# Patient Record
Sex: Female | Born: 1935 | Race: White | Hispanic: No | Marital: Married | Smoking: Former smoker
Health system: Southern US, Community
[De-identification: ages and names within clinical notes are randomized; demographics above are authoritative.]

## PROBLEM LIST (undated history)

## (undated) DIAGNOSIS — Z87442 Personal history of urinary calculi: Secondary | ICD-10-CM

## (undated) DIAGNOSIS — M810 Age-related osteoporosis without current pathological fracture: Secondary | ICD-10-CM

## (undated) DIAGNOSIS — R221 Localized swelling, mass and lump, neck: Secondary | ICD-10-CM

## (undated) DIAGNOSIS — I480 Paroxysmal atrial fibrillation: Secondary | ICD-10-CM

## (undated) DIAGNOSIS — I5032 Chronic diastolic (congestive) heart failure: Secondary | ICD-10-CM

## (undated) DIAGNOSIS — J189 Pneumonia, unspecified organism: Secondary | ICD-10-CM

## (undated) DIAGNOSIS — I369 Nonrheumatic tricuspid valve disorder, unspecified: Secondary | ICD-10-CM

## (undated) DIAGNOSIS — L659 Nonscarring hair loss, unspecified: Secondary | ICD-10-CM

## (undated) DIAGNOSIS — H9190 Unspecified hearing loss, unspecified ear: Secondary | ICD-10-CM

## (undated) DIAGNOSIS — H3530 Unspecified macular degeneration: Secondary | ICD-10-CM

## (undated) DIAGNOSIS — D45 Polycythemia vera: Secondary | ICD-10-CM

## (undated) DIAGNOSIS — M109 Gout, unspecified: Secondary | ICD-10-CM

## (undated) DIAGNOSIS — I059 Rheumatic mitral valve disease, unspecified: Secondary | ICD-10-CM

## (undated) DIAGNOSIS — E039 Hypothyroidism, unspecified: Secondary | ICD-10-CM

## (undated) DIAGNOSIS — S52509A Unspecified fracture of the lower end of unspecified radius, initial encounter for closed fracture: Secondary | ICD-10-CM

## (undated) DIAGNOSIS — M1990 Unspecified osteoarthritis, unspecified site: Secondary | ICD-10-CM

## (undated) DIAGNOSIS — G4700 Insomnia, unspecified: Secondary | ICD-10-CM

## (undated) DIAGNOSIS — S52609A Unspecified fracture of lower end of unspecified ulna, initial encounter for closed fracture: Secondary | ICD-10-CM

## (undated) DIAGNOSIS — R9439 Abnormal result of other cardiovascular function study: Secondary | ICD-10-CM

## (undated) DIAGNOSIS — I10 Essential (primary) hypertension: Secondary | ICD-10-CM

## (undated) DIAGNOSIS — E7800 Pure hypercholesterolemia, unspecified: Secondary | ICD-10-CM

## (undated) HISTORY — PX: TONSILLECTOMY: SUR1361

## (undated) HISTORY — DX: Paroxysmal atrial fibrillation: I48.0

## (undated) HISTORY — DX: Insomnia, unspecified: G47.00

## (undated) HISTORY — DX: Unspecified hearing loss, unspecified ear: H91.90

## (undated) HISTORY — DX: Abnormal result of other cardiovascular function study: R94.39

## (undated) HISTORY — DX: Rheumatic mitral valve disease, unspecified: I05.9

## (undated) HISTORY — DX: Gout, unspecified: M10.9

## (undated) HISTORY — DX: Hypothyroidism, unspecified: E03.9

## (undated) HISTORY — DX: Unspecified fracture of the lower end of unspecified radius, initial encounter for closed fracture: S52.509A

## (undated) HISTORY — DX: Unspecified macular degeneration: H35.30

## (undated) HISTORY — PX: TYMPANOPLASTY: SHX33

## (undated) HISTORY — DX: Age-related osteoporosis without current pathological fracture: M81.0

## (undated) HISTORY — DX: Pneumonia, unspecified organism: J18.9

## (undated) HISTORY — DX: Polycythemia vera: D45

## (undated) HISTORY — DX: Chronic diastolic (congestive) heart failure: I50.32

## (undated) HISTORY — DX: Pure hypercholesterolemia, unspecified: E78.00

## (undated) HISTORY — PX: TOTAL ABDOMINAL HYSTERECTOMY: SHX209

## (undated) HISTORY — DX: Nonscarring hair loss, unspecified: L65.9

## (undated) HISTORY — DX: Essential (primary) hypertension: I10

## (undated) HISTORY — DX: Localized swelling, mass and lump, neck: R22.1

## (undated) HISTORY — DX: Unspecified fracture of lower end of unspecified ulna, initial encounter for closed fracture: S52.609A

## (undated) HISTORY — DX: Nonrheumatic tricuspid valve disorder, unspecified: I36.9

---

## 1997-06-17 ENCOUNTER — Other Ambulatory Visit: Admission: RE | Admit: 1997-06-17 | Discharge: 1997-06-17 | Payer: Self-pay | Admitting: Obstetrics and Gynecology

## 1998-06-18 ENCOUNTER — Ambulatory Visit (HOSPITAL_COMMUNITY): Admission: RE | Admit: 1998-06-18 | Discharge: 1998-06-18 | Payer: Self-pay | Admitting: Obstetrics and Gynecology

## 1998-06-18 HISTORY — PX: BREAST BIOPSY: SHX20

## 1998-06-20 ENCOUNTER — Other Ambulatory Visit: Admission: RE | Admit: 1998-06-20 | Discharge: 1998-06-20 | Payer: Self-pay | Admitting: Obstetrics and Gynecology

## 2000-06-29 ENCOUNTER — Other Ambulatory Visit: Admission: RE | Admit: 2000-06-29 | Discharge: 2000-06-29 | Payer: Self-pay | Admitting: Obstetrics and Gynecology

## 2000-07-07 ENCOUNTER — Ambulatory Visit (HOSPITAL_COMMUNITY): Admission: RE | Admit: 2000-07-07 | Discharge: 2000-07-07 | Payer: Self-pay | Admitting: Obstetrics and Gynecology

## 2000-07-07 ENCOUNTER — Encounter: Payer: Self-pay | Admitting: Obstetrics and Gynecology

## 2001-07-18 ENCOUNTER — Ambulatory Visit (HOSPITAL_COMMUNITY): Admission: RE | Admit: 2001-07-18 | Discharge: 2001-07-18 | Payer: Self-pay | Admitting: Obstetrics and Gynecology

## 2001-07-18 ENCOUNTER — Encounter: Payer: Self-pay | Admitting: Obstetrics and Gynecology

## 2005-06-09 ENCOUNTER — Encounter: Payer: Self-pay | Admitting: Obstetrics and Gynecology

## 2006-08-22 ENCOUNTER — Ambulatory Visit: Payer: Self-pay | Admitting: Orthopedic Surgery

## 2006-08-23 ENCOUNTER — Ambulatory Visit (HOSPITAL_COMMUNITY): Admission: RE | Admit: 2006-08-23 | Discharge: 2006-08-23 | Payer: Self-pay | Admitting: Orthopedic Surgery

## 2008-07-02 LAB — HM DEXA SCAN: HM Dexa Scan: -2.6

## 2008-09-30 ENCOUNTER — Ambulatory Visit (HOSPITAL_COMMUNITY): Admission: RE | Admit: 2008-09-30 | Discharge: 2008-09-30 | Payer: Self-pay | Admitting: Pulmonary Disease

## 2011-03-16 DIAGNOSIS — H35369 Drusen (degenerative) of macula, unspecified eye: Secondary | ICD-10-CM | POA: Diagnosis not present

## 2011-03-16 DIAGNOSIS — H353190 Nonexudative age-related macular degeneration, unspecified eye, stage unspecified: Secondary | ICD-10-CM | POA: Diagnosis not present

## 2011-05-11 DIAGNOSIS — E039 Hypothyroidism, unspecified: Secondary | ICD-10-CM | POA: Diagnosis not present

## 2011-05-11 DIAGNOSIS — I10 Essential (primary) hypertension: Secondary | ICD-10-CM | POA: Diagnosis not present

## 2011-05-26 DIAGNOSIS — Z803 Family history of malignant neoplasm of breast: Secondary | ICD-10-CM | POA: Diagnosis not present

## 2011-05-26 DIAGNOSIS — M810 Age-related osteoporosis without current pathological fracture: Secondary | ICD-10-CM | POA: Diagnosis not present

## 2011-05-26 DIAGNOSIS — Z01419 Encounter for gynecological examination (general) (routine) without abnormal findings: Secondary | ICD-10-CM | POA: Diagnosis not present

## 2011-05-26 DIAGNOSIS — Z8041 Family history of malignant neoplasm of ovary: Secondary | ICD-10-CM | POA: Diagnosis not present

## 2011-05-27 DIAGNOSIS — Z1231 Encounter for screening mammogram for malignant neoplasm of breast: Secondary | ICD-10-CM | POA: Diagnosis not present

## 2011-07-18 DIAGNOSIS — I10 Essential (primary) hypertension: Secondary | ICD-10-CM | POA: Diagnosis not present

## 2011-07-18 DIAGNOSIS — S52509A Unspecified fracture of the lower end of unspecified radius, initial encounter for closed fracture: Secondary | ICD-10-CM | POA: Diagnosis not present

## 2011-07-18 DIAGNOSIS — W010XXA Fall on same level from slipping, tripping and stumbling without subsequent striking against object, initial encounter: Secondary | ICD-10-CM | POA: Diagnosis not present

## 2011-07-18 DIAGNOSIS — S52209A Unspecified fracture of shaft of unspecified ulna, initial encounter for closed fracture: Secondary | ICD-10-CM | POA: Diagnosis not present

## 2011-07-18 DIAGNOSIS — R55 Syncope and collapse: Secondary | ICD-10-CM | POA: Diagnosis not present

## 2011-07-18 DIAGNOSIS — IMO0002 Reserved for concepts with insufficient information to code with codable children: Secondary | ICD-10-CM | POA: Diagnosis not present

## 2011-07-18 DIAGNOSIS — S01501A Unspecified open wound of lip, initial encounter: Secondary | ICD-10-CM | POA: Diagnosis not present

## 2011-07-18 DIAGNOSIS — I6789 Other cerebrovascular disease: Secondary | ICD-10-CM | POA: Diagnosis not present

## 2011-07-18 DIAGNOSIS — S025XXA Fracture of tooth (traumatic), initial encounter for closed fracture: Secondary | ICD-10-CM | POA: Diagnosis not present

## 2011-07-18 DIAGNOSIS — M1990 Unspecified osteoarthritis, unspecified site: Secondary | ICD-10-CM | POA: Diagnosis not present

## 2011-07-18 DIAGNOSIS — Z23 Encounter for immunization: Secondary | ICD-10-CM | POA: Diagnosis not present

## 2011-07-18 DIAGNOSIS — E039 Hypothyroidism, unspecified: Secondary | ICD-10-CM | POA: Diagnosis not present

## 2011-07-18 DIAGNOSIS — M25539 Pain in unspecified wrist: Secondary | ICD-10-CM | POA: Diagnosis not present

## 2011-07-18 DIAGNOSIS — S52309A Unspecified fracture of shaft of unspecified radius, initial encounter for closed fracture: Secondary | ICD-10-CM | POA: Diagnosis not present

## 2011-07-21 DIAGNOSIS — N390 Urinary tract infection, site not specified: Secondary | ICD-10-CM | POA: Diagnosis not present

## 2011-07-21 DIAGNOSIS — S52209A Unspecified fracture of shaft of unspecified ulna, initial encounter for closed fracture: Secondary | ICD-10-CM | POA: Diagnosis not present

## 2011-08-02 DIAGNOSIS — S52209A Unspecified fracture of shaft of unspecified ulna, initial encounter for closed fracture: Secondary | ICD-10-CM | POA: Diagnosis not present

## 2011-08-04 DIAGNOSIS — I10 Essential (primary) hypertension: Secondary | ICD-10-CM | POA: Diagnosis not present

## 2011-08-04 DIAGNOSIS — E039 Hypothyroidism, unspecified: Secondary | ICD-10-CM | POA: Diagnosis not present

## 2011-08-04 DIAGNOSIS — N390 Urinary tract infection, site not specified: Secondary | ICD-10-CM | POA: Diagnosis not present

## 2011-08-04 DIAGNOSIS — R609 Edema, unspecified: Secondary | ICD-10-CM | POA: Diagnosis not present

## 2011-08-05 DIAGNOSIS — E039 Hypothyroidism, unspecified: Secondary | ICD-10-CM | POA: Diagnosis not present

## 2011-08-05 DIAGNOSIS — I10 Essential (primary) hypertension: Secondary | ICD-10-CM | POA: Diagnosis not present

## 2011-08-05 DIAGNOSIS — N390 Urinary tract infection, site not specified: Secondary | ICD-10-CM | POA: Diagnosis not present

## 2011-08-05 DIAGNOSIS — R609 Edema, unspecified: Secondary | ICD-10-CM | POA: Diagnosis not present

## 2011-08-06 DIAGNOSIS — D582 Other hemoglobinopathies: Secondary | ICD-10-CM | POA: Diagnosis not present

## 2011-08-06 DIAGNOSIS — E875 Hyperkalemia: Secondary | ICD-10-CM | POA: Diagnosis not present

## 2011-08-06 DIAGNOSIS — E871 Hypo-osmolality and hyponatremia: Secondary | ICD-10-CM | POA: Diagnosis not present

## 2011-08-06 DIAGNOSIS — E039 Hypothyroidism, unspecified: Secondary | ICD-10-CM | POA: Diagnosis not present

## 2011-08-06 LAB — BASIC METABOLIC PANEL
BUN: 19 mg/dL (ref 4–21)
CREATININE: 0.7 mg/dL (ref <=1.1)
GLUCOSE: 100 mg/dL
SODIUM: 131 mmol/L — AB (ref 137–147)

## 2011-08-09 DIAGNOSIS — E875 Hyperkalemia: Secondary | ICD-10-CM | POA: Diagnosis not present

## 2011-08-09 DIAGNOSIS — D582 Other hemoglobinopathies: Secondary | ICD-10-CM | POA: Diagnosis not present

## 2011-08-09 DIAGNOSIS — R609 Edema, unspecified: Secondary | ICD-10-CM | POA: Diagnosis not present

## 2011-08-09 LAB — CBC AND DIFFERENTIAL
Neutrophils Absolute: 10 /uL
WBC: 12.7 10*3/mL

## 2011-08-11 DIAGNOSIS — R718 Other abnormality of red blood cells: Secondary | ICD-10-CM | POA: Diagnosis not present

## 2011-08-11 DIAGNOSIS — D45 Polycythemia vera: Secondary | ICD-10-CM | POA: Diagnosis not present

## 2011-08-18 DIAGNOSIS — N390 Urinary tract infection, site not specified: Secondary | ICD-10-CM | POA: Diagnosis not present

## 2011-08-18 DIAGNOSIS — S52209A Unspecified fracture of shaft of unspecified ulna, initial encounter for closed fracture: Secondary | ICD-10-CM | POA: Diagnosis not present

## 2011-08-18 DIAGNOSIS — D45 Polycythemia vera: Secondary | ICD-10-CM | POA: Diagnosis not present

## 2011-08-18 DIAGNOSIS — S52309A Unspecified fracture of shaft of unspecified radius, initial encounter for closed fracture: Secondary | ICD-10-CM | POA: Diagnosis not present

## 2011-08-25 DIAGNOSIS — D45 Polycythemia vera: Secondary | ICD-10-CM | POA: Diagnosis not present

## 2011-08-30 DIAGNOSIS — N390 Urinary tract infection, site not specified: Secondary | ICD-10-CM | POA: Diagnosis not present

## 2011-09-02 DIAGNOSIS — D45 Polycythemia vera: Secondary | ICD-10-CM | POA: Diagnosis not present

## 2011-09-06 DIAGNOSIS — D45 Polycythemia vera: Secondary | ICD-10-CM | POA: Diagnosis not present

## 2011-09-09 DIAGNOSIS — E213 Hyperparathyroidism, unspecified: Secondary | ICD-10-CM | POA: Diagnosis not present

## 2011-09-09 DIAGNOSIS — E210 Primary hyperparathyroidism: Secondary | ICD-10-CM | POA: Diagnosis not present

## 2011-09-13 DIAGNOSIS — D582 Other hemoglobinopathies: Secondary | ICD-10-CM | POA: Diagnosis not present

## 2011-09-15 DIAGNOSIS — Z961 Presence of intraocular lens: Secondary | ICD-10-CM | POA: Diagnosis not present

## 2011-09-15 DIAGNOSIS — H26499 Other secondary cataract, unspecified eye: Secondary | ICD-10-CM | POA: Diagnosis not present

## 2011-09-15 DIAGNOSIS — H3530 Unspecified macular degeneration: Secondary | ICD-10-CM | POA: Diagnosis not present

## 2011-09-16 DIAGNOSIS — D582 Other hemoglobinopathies: Secondary | ICD-10-CM | POA: Diagnosis not present

## 2011-09-20 DIAGNOSIS — R5381 Other malaise: Secondary | ICD-10-CM | POA: Diagnosis not present

## 2011-09-20 DIAGNOSIS — R5383 Other fatigue: Secondary | ICD-10-CM | POA: Diagnosis not present

## 2011-09-22 DIAGNOSIS — S52209A Unspecified fracture of shaft of unspecified ulna, initial encounter for closed fracture: Secondary | ICD-10-CM | POA: Diagnosis not present

## 2011-09-22 DIAGNOSIS — S52309A Unspecified fracture of shaft of unspecified radius, initial encounter for closed fracture: Secondary | ICD-10-CM | POA: Diagnosis not present

## 2011-09-22 DIAGNOSIS — D45 Polycythemia vera: Secondary | ICD-10-CM | POA: Diagnosis not present

## 2011-09-30 DIAGNOSIS — D45 Polycythemia vera: Secondary | ICD-10-CM | POA: Diagnosis not present

## 2011-10-06 DIAGNOSIS — D45 Polycythemia vera: Secondary | ICD-10-CM | POA: Diagnosis not present

## 2011-10-13 DIAGNOSIS — D45 Polycythemia vera: Secondary | ICD-10-CM | POA: Diagnosis not present

## 2011-10-20 DIAGNOSIS — D45 Polycythemia vera: Secondary | ICD-10-CM | POA: Diagnosis not present

## 2011-10-27 DIAGNOSIS — D45 Polycythemia vera: Secondary | ICD-10-CM | POA: Diagnosis not present

## 2011-11-03 DIAGNOSIS — D45 Polycythemia vera: Secondary | ICD-10-CM | POA: Diagnosis not present

## 2011-11-09 DIAGNOSIS — R5381 Other malaise: Secondary | ICD-10-CM | POA: Diagnosis not present

## 2011-11-09 DIAGNOSIS — R0602 Shortness of breath: Secondary | ICD-10-CM | POA: Diagnosis not present

## 2011-11-09 DIAGNOSIS — R609 Edema, unspecified: Secondary | ICD-10-CM | POA: Diagnosis not present

## 2011-11-09 DIAGNOSIS — Z87898 Personal history of other specified conditions: Secondary | ICD-10-CM | POA: Diagnosis not present

## 2011-11-10 DIAGNOSIS — D45 Polycythemia vera: Secondary | ICD-10-CM | POA: Diagnosis not present

## 2011-11-11 DIAGNOSIS — H35369 Drusen (degenerative) of macula, unspecified eye: Secondary | ICD-10-CM | POA: Diagnosis not present

## 2011-11-11 DIAGNOSIS — H353190 Nonexudative age-related macular degeneration, unspecified eye, stage unspecified: Secondary | ICD-10-CM | POA: Diagnosis not present

## 2011-11-11 DIAGNOSIS — H11009 Unspecified pterygium of unspecified eye: Secondary | ICD-10-CM | POA: Diagnosis not present

## 2011-11-12 DIAGNOSIS — D45 Polycythemia vera: Secondary | ICD-10-CM | POA: Diagnosis not present

## 2011-11-16 DIAGNOSIS — I517 Cardiomegaly: Secondary | ICD-10-CM | POA: Diagnosis not present

## 2011-11-16 DIAGNOSIS — R0602 Shortness of breath: Secondary | ICD-10-CM | POA: Diagnosis not present

## 2011-11-16 DIAGNOSIS — I079 Rheumatic tricuspid valve disease, unspecified: Secondary | ICD-10-CM | POA: Diagnosis not present

## 2011-11-16 DIAGNOSIS — I4891 Unspecified atrial fibrillation: Secondary | ICD-10-CM | POA: Diagnosis not present

## 2011-11-16 DIAGNOSIS — R609 Edema, unspecified: Secondary | ICD-10-CM | POA: Diagnosis not present

## 2011-11-24 DIAGNOSIS — Z23 Encounter for immunization: Secondary | ICD-10-CM | POA: Diagnosis not present

## 2011-11-25 DIAGNOSIS — D1739 Benign lipomatous neoplasm of skin and subcutaneous tissue of other sites: Secondary | ICD-10-CM | POA: Diagnosis not present

## 2011-11-29 DIAGNOSIS — R609 Edema, unspecified: Secondary | ICD-10-CM | POA: Diagnosis not present

## 2011-11-29 DIAGNOSIS — M79609 Pain in unspecified limb: Secondary | ICD-10-CM | POA: Diagnosis not present

## 2011-12-01 DIAGNOSIS — I369 Nonrheumatic tricuspid valve disorder, unspecified: Secondary | ICD-10-CM | POA: Diagnosis not present

## 2011-12-01 DIAGNOSIS — R609 Edema, unspecified: Secondary | ICD-10-CM | POA: Diagnosis not present

## 2011-12-01 DIAGNOSIS — I4891 Unspecified atrial fibrillation: Secondary | ICD-10-CM | POA: Diagnosis not present

## 2011-12-01 DIAGNOSIS — R0602 Shortness of breath: Secondary | ICD-10-CM | POA: Diagnosis not present

## 2011-12-01 DIAGNOSIS — I10 Essential (primary) hypertension: Secondary | ICD-10-CM | POA: Diagnosis not present

## 2011-12-01 DIAGNOSIS — I059 Rheumatic mitral valve disease, unspecified: Secondary | ICD-10-CM | POA: Diagnosis not present

## 2011-12-02 DIAGNOSIS — E039 Hypothyroidism, unspecified: Secondary | ICD-10-CM | POA: Diagnosis not present

## 2011-12-02 DIAGNOSIS — M79609 Pain in unspecified limb: Secondary | ICD-10-CM | POA: Diagnosis not present

## 2011-12-02 DIAGNOSIS — I10 Essential (primary) hypertension: Secondary | ICD-10-CM | POA: Diagnosis not present

## 2011-12-02 DIAGNOSIS — I369 Nonrheumatic tricuspid valve disorder, unspecified: Secondary | ICD-10-CM | POA: Diagnosis not present

## 2011-12-02 DIAGNOSIS — R609 Edema, unspecified: Secondary | ICD-10-CM | POA: Diagnosis not present

## 2011-12-06 DIAGNOSIS — R609 Edema, unspecified: Secondary | ICD-10-CM | POA: Diagnosis not present

## 2011-12-06 DIAGNOSIS — M79609 Pain in unspecified limb: Secondary | ICD-10-CM | POA: Diagnosis not present

## 2011-12-08 DIAGNOSIS — R609 Edema, unspecified: Secondary | ICD-10-CM | POA: Diagnosis not present

## 2011-12-08 DIAGNOSIS — R0602 Shortness of breath: Secondary | ICD-10-CM | POA: Diagnosis not present

## 2011-12-08 DIAGNOSIS — I10 Essential (primary) hypertension: Secondary | ICD-10-CM | POA: Diagnosis not present

## 2011-12-08 DIAGNOSIS — I4891 Unspecified atrial fibrillation: Secondary | ICD-10-CM | POA: Diagnosis not present

## 2011-12-09 DIAGNOSIS — D45 Polycythemia vera: Secondary | ICD-10-CM | POA: Diagnosis not present

## 2011-12-10 DIAGNOSIS — I4891 Unspecified atrial fibrillation: Secondary | ICD-10-CM | POA: Diagnosis not present

## 2011-12-10 DIAGNOSIS — R609 Edema, unspecified: Secondary | ICD-10-CM | POA: Diagnosis not present

## 2011-12-10 DIAGNOSIS — Z87898 Personal history of other specified conditions: Secondary | ICD-10-CM | POA: Diagnosis not present

## 2011-12-10 DIAGNOSIS — R0602 Shortness of breath: Secondary | ICD-10-CM | POA: Diagnosis not present

## 2011-12-16 DIAGNOSIS — I4891 Unspecified atrial fibrillation: Secondary | ICD-10-CM | POA: Diagnosis not present

## 2011-12-21 DIAGNOSIS — I4891 Unspecified atrial fibrillation: Secondary | ICD-10-CM | POA: Diagnosis not present

## 2011-12-21 DIAGNOSIS — R221 Localized swelling, mass and lump, neck: Secondary | ICD-10-CM | POA: Diagnosis not present

## 2011-12-21 DIAGNOSIS — R220 Localized swelling, mass and lump, head: Secondary | ICD-10-CM | POA: Diagnosis not present

## 2011-12-21 DIAGNOSIS — R609 Edema, unspecified: Secondary | ICD-10-CM | POA: Diagnosis not present

## 2011-12-22 DIAGNOSIS — R0602 Shortness of breath: Secondary | ICD-10-CM | POA: Diagnosis not present

## 2011-12-22 DIAGNOSIS — I10 Essential (primary) hypertension: Secondary | ICD-10-CM | POA: Diagnosis not present

## 2011-12-22 DIAGNOSIS — I4891 Unspecified atrial fibrillation: Secondary | ICD-10-CM | POA: Diagnosis not present

## 2011-12-22 DIAGNOSIS — R9430 Abnormal result of cardiovascular function study, unspecified: Secondary | ICD-10-CM | POA: Diagnosis not present

## 2011-12-24 ENCOUNTER — Encounter: Payer: Self-pay | Admitting: *Deleted

## 2011-12-27 ENCOUNTER — Encounter: Payer: Self-pay | Admitting: Cardiology

## 2011-12-27 ENCOUNTER — Ambulatory Visit (INDEPENDENT_AMBULATORY_CARE_PROVIDER_SITE_OTHER): Payer: Medicare Other | Admitting: Cardiology

## 2011-12-27 VITALS — BP 118/72 | HR 71 | Ht 62.0 in | Wt 124.0 lb

## 2011-12-27 DIAGNOSIS — I509 Heart failure, unspecified: Secondary | ICD-10-CM

## 2011-12-27 DIAGNOSIS — I2510 Atherosclerotic heart disease of native coronary artery without angina pectoris: Secondary | ICD-10-CM | POA: Insufficient documentation

## 2011-12-27 DIAGNOSIS — N179 Acute kidney failure, unspecified: Secondary | ICD-10-CM | POA: Diagnosis not present

## 2011-12-27 DIAGNOSIS — I5032 Chronic diastolic (congestive) heart failure: Secondary | ICD-10-CM | POA: Insufficient documentation

## 2011-12-27 DIAGNOSIS — I10 Essential (primary) hypertension: Secondary | ICD-10-CM

## 2011-12-27 DIAGNOSIS — I4891 Unspecified atrial fibrillation: Secondary | ICD-10-CM | POA: Diagnosis not present

## 2011-12-27 DIAGNOSIS — R0602 Shortness of breath: Secondary | ICD-10-CM | POA: Diagnosis not present

## 2011-12-27 DIAGNOSIS — I480 Paroxysmal atrial fibrillation: Secondary | ICD-10-CM | POA: Insufficient documentation

## 2011-12-27 LAB — CBC WITH DIFFERENTIAL/PLATELET
Basophils Absolute: 0 10*3/uL (ref 0.0–0.1)
Basophils Relative: 0.3 % (ref 0.0–3.0)
Eosinophils Absolute: 0 10*3/uL (ref 0.0–0.7)
Eosinophils Relative: 0.4 % (ref 0.0–5.0)
HCT: 33.2 % — ABNORMAL LOW (ref 36.0–46.0)
Hemoglobin: 11.2 g/dL — ABNORMAL LOW (ref 12.0–15.0)
Lymphocytes Relative: 37.1 % (ref 12.0–46.0)
Lymphs Abs: 1.8 10*3/uL (ref 0.7–4.0)
MCHC: 33.8 g/dL (ref 30.0–36.0)
MCV: 119.8 fl — ABNORMAL HIGH (ref 78.0–100.0)
Monocytes Absolute: 0.5 10*3/uL (ref 0.1–1.0)
Monocytes Relative: 9.7 % (ref 3.0–12.0)
Neutro Abs: 2.5 10*3/uL (ref 1.4–7.7)
Neutrophils Relative %: 52.5 % (ref 43.0–77.0)
Platelets: 190 10*3/uL (ref 150.0–400.0)
RBC: 2.78 Mil/uL — ABNORMAL LOW (ref 3.87–5.11)
RDW: 16 % — ABNORMAL HIGH (ref 11.5–14.6)
WBC: 4.8 10*3/uL (ref 4.5–10.5)

## 2011-12-27 LAB — BASIC METABOLIC PANEL WITH GFR
BUN: 70 mg/dL — ABNORMAL HIGH (ref 6–23)
CO2: 26 meq/L (ref 19–32)
Calcium: 10.7 mg/dL — ABNORMAL HIGH (ref 8.4–10.5)
Chloride: 104 meq/L (ref 96–112)
Creatinine, Ser: 1.5 mg/dL — ABNORMAL HIGH (ref 0.4–1.2)
GFR: 35.84 mL/min — ABNORMAL LOW
Glucose, Bld: 102 mg/dL — ABNORMAL HIGH (ref 70–99)
Potassium: 5.4 meq/L — ABNORMAL HIGH (ref 3.5–5.1)
Sodium: 138 meq/L (ref 135–145)

## 2011-12-27 LAB — BRAIN NATRIURETIC PEPTIDE: Pro B Natriuretic peptide (BNP): 573 pg/mL — ABNORMAL HIGH (ref 0.0–100.0)

## 2011-12-27 MED ORDER — AMIODARONE HCL 200 MG PO TABS: ORAL_TABLET | ORAL | Status: DC

## 2011-12-27 NOTE — Patient Instructions (Addendum)
Start amiodarone 400mg  two times a day for 1 week, then 400mg  daily for 1 week, then 200mg  daily. Stay on 200mg  daily. This will be 2 (total 400mg ) 200mg  tablets two times a day for 1 week, then 2 (total 400mg ) 200mg  tablets daily for 1 week, then 1 of your 200mg  tablets. Stay on 200mg  daily.   Your physician recommends that you have lab work today-CBCd/BMET/BNP.   Your physician recommends that you have a FASTING lipid profile. Do not eat or drink after midnight . I have given you an order to have this done at your primary care doctor. Please fax the results to [phone] Dr Shirlee Latch.   Your physician recommends that you schedule a follow-up appointment in: 2 weeks after the cardioversion with Dr Shirlee Latch. I am trying to get the cardioversion scheduled for 01/19/12.

## 2011-12-27 NOTE — Progress Notes (Signed)
Patient ID: Sarah Mays, female   DOB: Jul 11, 1930, 78 y.o.   MRN: [id number] PCP: Dr. Gwenevere Abbot in [place]  78 yo with persistent atrial fibrillation and chronic diastolic CHF presents for cardiology evaluation.  Patient has had some exertional dyspnea for over a year.  The dyspnea, however, became significantly worse in late September/early October.  She notes dyspnea walking up stairs or even gentle hills.  She is short of breath walking long distances.  She has had mild ankle edema for over a year.  Again, the edema became considerably worse in late September/early October.  She had an echo done showed preserved EF with mild pulmonary HTN.  On the echo, she was noted to be in atrial fibrillation.  This was confirmed with subsequent evaluations.  She has had no stroke-like symptoms.  No chest pain.  She was initially started on warfarin but stopped it due to nosebleeds.  She is currently on Eliquis at reduced dose.  She was started on Lasix because of the lower extremity swelling.  She is now taking it at 40 mg bid and the swelling has dissipated.    Labs were done today and were significant for hyperkalemia (K = 5.4) and abnormal renal function (BUN 70, creatinine 1.5).    ECG: atrial fibrillation at 78 with nonspecific T wave flattening.   Labs (11/13): TSH normal, K 5.4, creatinine 1.5, BUN 70, HCT 33.2, BNP 573  PMH: 1. Atrial fibrillation: Diagnosed initially in 10/13.  Holter monitor in 11/13 showed atrial fibrillation with average rate 65.  2. Chronic diastolic CHF: Echo (10/13) with EF 65-70%, mild MR, moderate biatrial enlargement, moderate TR, PA systolic pressure 45 mmHg.   3. ETT-Sestamibi (11/13): Exercised to stage II, small partially reversible anteroapical perfusion defect suggesting mild ischemia versus attenuation, EF 80%.  4. HTN 5. Polycythemia vera: Has had phlebotomy only once.  6. Hypothyroidism 7. Macular degeneration 8. TAH 1980  SH: Prior smoker (years  ago).  Married, lives in [place].  Occasional ETOH.  Daughter works for [employer].   FH: CAD  ROS: All systems reviewed and negative except as per HPI.   Current Outpatient Prescriptions  Medication Sig Dispense Refill  . amLODipine-olmesartan (AZOR) 10-40 MG per tablet Take 1 tablet by mouth daily.      [name] Kitchen apixaban (ELIQUIS) 2.5 MG TABS tablet Take 2.5 mg by mouth 2 (two) times daily.      . Cholecalciferol (VITAMIN D3) 400 UNITS CAPS Take 1 capsule by mouth daily.      . furosemide (LASIX) 20 MG tablet Take 40 mg by mouth 2 (two) times daily.       [name] Kitchen glucosamine-chondroitin 500-400 MG tablet Take 1 tablet by mouth daily.      . hydroxyurea (HYDREA) 500 MG capsule Take by mouth. TAKE 1 CAPSULE ON Monday & Tuesday ALL OTHER DAYS TAKE 2 CAPSULES.   May take with food to minimize GI side effects.      [name] Kitchen levothyroxine (SYNTHROID, LEVOTHROID) 75 MCG tablet Take 75 mcg by mouth daily.      . Lutein 10 MG TABS Take 1 tablet by mouth daily.      . magnesium oxide (MAG-OX) 400 MG tablet Take 400 mg by mouth daily.      . nebivolol (BYSTOLIC) 5 MG tablet Take 5 mg by mouth daily.      . potassium chloride (K-DUR) 10 MEQ tablet Take 10 mEq by mouth 2 (two) times daily.      [name] Kitchen  amiodarone (PACERONE) 200 MG tablet 2 (total 400mg  )two times a day for 1 week, then 2 (total 400mg ) daily for 1 week, then 1 daily. Stay on 1 daily.  60 tablet  3    BP 118/72  Pulse 71  Ht 5\' 2"  (1.575 m)  Wt 124 lb (56.246 kg)  BMI 22.68 kg/m2 General: NAD Neck: No JVD, no thyromegaly or thyroid nodule.  Lungs: Clear to auscultation bilaterally with normal respiratory effort. CV: Nondisplaced PMI.  Heart irregular S1/S2, no S3/S4, 1/6 HSM LLSB.  No peripheral edema.  No carotid bruit.  Normal pedal pulses.  Abdomen: Soft, nontender, no hepatosplenomegaly, no distention.  Skin: Intact without lesions or rashes.  Neurologic: Alert and oriented x 3.  Psych: Normal affect. Extremities: No clubbing or cyanosis.  HEENT:  Normal.   Assessment/Plan:  1. Renal dysfunction: Suspect AKI in the setting of aggressive recent diuresis.  I do not, however, know her baseline creatinine.   - Stop Lasix x 3 days, then decrease to 40 mg once daily.  - Stop KCl (hyperkalemia).  - Stop olmesartan.   - Repeat BMET in 1 week.  2. Atrial fibrillation: First noted in 10/13.  Her increased exertional dyspnea seems to date from around that time.  I suspect that the onset of atrial fibrillation probably triggered a diastolic CHF exacerbation. CHADSVASC score is 14 (age, female gender, HTN, CHF).  She is anticoagulated with Eliquis 2.5 mg bid.  I would agree with the reduced dose of Eliquis given her age, small size (< 60 kg), and renal dysfunction.  As I think that atrial fibrillation is a major driver of her symptoms, I told her that I thought a trial of DCCV would be reasonable.  Given her moderately enlarged atria, I think that she would need an antiarrhythmic for the best chance to maintain NSR after cardioversion.  As she lives considerably distant from [place], I think that amiodarone would probably be the best choice here.  - Continue Eliquis 2.5 mg bid.  After a month of anticoagulation (20 more days), will plan to bring her in for DCCV.  - Start amiodarone 400 mg bid x 1 wk, then 400 mg daily x 1 wk, then 200 mg daily.  She will need periodic monitoring of TSH and LFTs.  She will need yearly eye exams and a baseline set of PFTs.  I talked to her about the possible side effects from amiodarone.  However, will try to maintain her on only a low dose.   3. CAD: Patient has not had any chest pain.  She has exertional dyspnea that seems to date from around the likely onset of her atrial fibrillation.  She had an ETT-Sestamibi recently that showed a small partially reversible anteroapical perfusion defect.  This may be a small area of ischemia but soft tissue attenuation is certainly also possible.  This does not sound like a high risk  study.  I will plan to deal with her atrial fibrillation first (cardioversion) and will consider cath if she continues to have significant exertional dyspnea when in NSR.  Her abnormal renal function currently (creatinine 1.5 with BUN 70) also makes cath risky.  4. HTN: As above, will be stopping olmesartan.  Will continue amlodipine and will increase nebivolol to 10 mg daily.  5. Chronic diastolic CHF: Patient did not appear volume overloaded on exam.  Her pro-BNP was elevated at 573 but this is significantly affected by age and renal function. As above, she seems to  be over-diuresed.  I will have her hold Lasix x 3 days then decrease dose to 40 mg once daily.  6. I will have her get fasting lipids.  7. Mrs Hinger has a rather complicated set of issues we are dealing with here.  She wants her cardiac care in [place] (daughter lives here) though she lives distantly.  Will try to coordinate care with her PCP.   Marca Ancona 12/27/2011

## 2011-12-28 ENCOUNTER — Other Ambulatory Visit: Payer: Self-pay | Admitting: *Deleted

## 2011-12-28 DIAGNOSIS — I4891 Unspecified atrial fibrillation: Secondary | ICD-10-CM

## 2011-12-28 DIAGNOSIS — I10 Essential (primary) hypertension: Secondary | ICD-10-CM

## 2011-12-28 MED ORDER — AMLODIPINE BESYLATE 10 MG PO TABS: 10.0000 mg | ORAL_TABLET | Freq: Every day | ORAL | Status: DC

## 2011-12-29 ENCOUNTER — Encounter: Payer: Self-pay | Admitting: *Deleted

## 2011-12-30 DIAGNOSIS — I5032 Chronic diastolic (congestive) heart failure: Secondary | ICD-10-CM | POA: Diagnosis not present

## 2011-12-30 DIAGNOSIS — I4891 Unspecified atrial fibrillation: Secondary | ICD-10-CM | POA: Diagnosis not present

## 2011-12-31 ENCOUNTER — Telehealth: Payer: Self-pay | Admitting: Cardiology

## 2011-12-31 NOTE — Telephone Encounter (Signed)
Pt was updated with lasix instructions, she will take 40 mg daily starting today, pt agreed to plan.

## 2011-12-31 NOTE — Telephone Encounter (Signed)
New problem:   clarification of dosage  & direction of medication .  Furosemide

## 2012-01-03 ENCOUNTER — Ambulatory Visit (INDEPENDENT_AMBULATORY_CARE_PROVIDER_SITE_OTHER): Payer: Medicare Other | Admitting: Cardiology

## 2012-01-03 ENCOUNTER — Encounter: Payer: Self-pay | Admitting: Cardiology

## 2012-01-03 ENCOUNTER — Other Ambulatory Visit (INDEPENDENT_AMBULATORY_CARE_PROVIDER_SITE_OTHER): Payer: Medicare Other

## 2012-01-03 VITALS — BP 124/60 | HR 63 | Ht 62.0 in | Wt 129.8 lb

## 2012-01-03 DIAGNOSIS — I2510 Atherosclerotic heart disease of native coronary artery without angina pectoris: Secondary | ICD-10-CM | POA: Diagnosis not present

## 2012-01-03 DIAGNOSIS — I10 Essential (primary) hypertension: Secondary | ICD-10-CM

## 2012-01-03 DIAGNOSIS — R0602 Shortness of breath: Secondary | ICD-10-CM

## 2012-01-03 DIAGNOSIS — I4891 Unspecified atrial fibrillation: Secondary | ICD-10-CM

## 2012-01-03 DIAGNOSIS — I5032 Chronic diastolic (congestive) heart failure: Secondary | ICD-10-CM

## 2012-01-03 DIAGNOSIS — I509 Heart failure, unspecified: Secondary | ICD-10-CM

## 2012-01-03 DIAGNOSIS — N179 Acute kidney failure, unspecified: Secondary | ICD-10-CM

## 2012-01-03 LAB — HEPATIC FUNCTION PANEL: Total Bilirubin: 1.2 mg/dL (ref 0.3–1.2)

## 2012-01-03 LAB — BASIC METABOLIC PANEL
Chloride: 102 mEq/L (ref 96–112)
Creatinine, Ser: 1.2 mg/dL (ref 0.4–1.2)
Potassium: 3.8 mEq/L (ref 3.5–5.1)

## 2012-01-03 LAB — BRAIN NATRIURETIC PEPTIDE: Pro B Natriuretic peptide (BNP): 866 pg/mL — ABNORMAL HIGH (ref 0.0–100.0)

## 2012-01-03 LAB — TSH: TSH: 6.45 u[IU]/mL — ABNORMAL HIGH (ref 0.35–5.50)

## 2012-01-03 MED ORDER — AMIODARONE HCL 200 MG PO TABS: ORAL_TABLET | ORAL | Status: DC

## 2012-01-03 MED ORDER — NEBIVOLOL HCL 5 MG PO TABS: 5.0000 mg | ORAL_TABLET | Freq: Every day | ORAL | Status: DC

## 2012-01-03 NOTE — Progress Notes (Signed)
Patient ID: Sarah Mays, female   DOB: 1930/02/27, 78 y.o.   MRN: [id number] PCP: Dr. Gwenevere Abbot in [place]  78 y.o. with persistent atrial fibrillation and chronic diastolic CHF returns for cardiology evaluation.  Patient has had some exertional dyspnea for over a year.  The dyspnea, however, became significantly worse in late September/early October.  She notes dyspnea walking up stairs or even gentle hills.  She is short of breath walking long distances.  She has had mild ankle edema for over a year.  Again, the edema became considerably worse in late September/early October.  She had an echo done showed preserved EF with mild pulmonary HTN.  On the echo, she was noted to be in atrial fibrillation.  This was confirmed with subsequent evaluations.  She has had no stroke-like symptoms.  No chest pain.  She was initially started on warfarin but stopped it due to nosebleeds.  She is currently on Eliquis at reduced dose.  She was started on Lasix because of the lower extremity swelling.  She was taking it at 40 mg bid.    At her initial appointment here, she was hyperkalemic with elevated BUN and creatinine (significantly above her baseline).  I had her hold Lasix for 3 days then cut it back to once daily.  I stopped olmesartan and supplemental K.  I also started her on amiodarone in preparation for DCCV.  I thought that given her moderately enlarged atria, her chances of maintaining NSR would not be good without an antiarrhythmic.  Since then, her ankle edema has worsened.  Her weight is up about 5 lbs.  Does report less exertional dyspnea.  ECG today shows NSR, but her HR was slow in the 40s at rest.     ECG: NSR at 44 with nonspecific inferior T wave inversions.   Labs (7/13): K 4.7, creatinine 0.56 Labs (8/13): K 5.4, creatinine 0.69 Labs (11/13): TSH normal, K 5.4, creatinine 1.5, BUN 70, HCT 33.2, BNP 573  PMH: 1. Atrial fibrillation: Diagnosed initially in 10/13.  Holter monitor in 11/13  showed atrial fibrillation with average rate 65.  Back in NSR on amiodarone on 01/03/12.  2. Chronic diastolic CHF: Echo (10/13) with EF 65-70%, mild MR, moderate biatrial enlargement, moderate TR, PA systolic pressure 45 mmHg.   3. ETT-Sestamibi (11/13): Exercised to stage II, small partially reversible anteroapical perfusion defect suggesting mild ischemia versus attenuation, EF 80%.  4. HTN 5. Polycythemia vera: Has had phlebotomy only once.  6. Hypothyroidism 7. Macular degeneration 8. TAH 1980 9. Familial hypocalciuric hypercalcemia  SH: Prior smoker (years ago).  Married, lives in [place].  Occasional ETOH.  Daughter works for [employer].   FH: CAD  ROS: All systems reviewed and negative except as per HPI.   Current Outpatient Prescriptions  Medication Sig Dispense Refill  . amiodarone (PACERONE) 200 MG tablet 2 TABLETS ONCE DAILY X ONE WEEK THEN ONE TABLET ONCE DAILY  60 tablet  3  . amLODipine (NORVASC) 10 MG tablet Take 1 tablet (10 mg total) by mouth daily.  30 tablet  6  . apixaban (ELIQUIS) 2.5 MG TABS tablet Take 2.5 mg by mouth 2 (two) times daily.      . Cholecalciferol (VITAMIN D3) 400 UNITS CAPS Take 1 capsule by mouth daily.      . furosemide (LASIX) 40 MG tablet Take 1 tablet (40 mg total) by mouth daily.      [name] Kitchen glucosamine-chondroitin 500-400 MG tablet Take 1 tablet by mouth daily.      [name] Kitchen  hydroxyurea (HYDREA) 500 MG capsule Take by mouth. TAKE 1 CAPSULE ON Monday & Tuesday ALL OTHER DAYS TAKE 2 CAPSULES.   May take with food to minimize GI side effects.      [name] Kitchen levothyroxine (SYNTHROID, LEVOTHROID) 75 MCG tablet Take 75 mcg by mouth daily.      . Lutein 10 MG TABS Take 1 tablet by mouth daily.      . magnesium oxide (MAG-OX) 400 MG tablet Take 400 mg by mouth daily.      . nebivolol (BYSTOLIC) 5 MG tablet Take 1 tablet (5 mg total) by mouth daily. 2 (total 10mg ) 5mg  tablets  30 tablet    . [DISCONTINUED] amiodarone (PACERONE) 200 MG tablet 2 (total 400mg  )two times a  day for 1 week, then 2 (total 400mg ) daily for 1 week, then 1 daily. Stay on 1 daily.  60 tablet  3  . [DISCONTINUED] nebivolol (BYSTOLIC) 5 MG tablet 2 (total 10mg ) 5mg  tablets        BP 124/60  Pulse 63  Ht 5\' 2"  (1.575 m)  Wt 129 lb 12.8 oz (58.877 kg)  BMI 23.74 kg/m2 General: NAD Neck: No JVD, no thyromegaly or thyroid nodule.  Lungs: Slight crackles at bases bilaterally.  CV: Nondisplaced PMI.  Heart irregular S1/S2, no S3/S4, 1/6 HSM LLSB.  2+ ankle edema.  No carotid bruit.  Normal pedal pulses.  Abdomen: Soft, nontender, no hepatosplenomegaly, no distention.  Neurologic: Alert and oriented x 3.  Psych: Normal affect. Extremities: No clubbing or cyanosis.   Assessment/Plan:  1. Renal dysfunction: Suspect AKI in the setting of aggressive recent diuresis.  She was considerably up from baseline BUN/creatinine when we checked her BMET about a week ago.  - Repeat BMET today.   2. Atrial fibrillation: First noted in 10/13.  Her increased exertional dyspnea seems to date from around that time.  I suspect that the onset of atrial fibrillation probably triggered a diastolic CHF exacerbation. CHADSVASC score is 76 (age, female gender, HTN, CHF).  She is anticoagulated with Eliquis 2.5 mg bid.  I would agree with the reduced dose of Eliquis given her age, small size (< 76 kg), and renal dysfunction.  Given her moderately enlarged atria, I thought that she would need an antiarrhythmic for the best chance to maintain NSR after cardioversion.  As she lives considerably distant from [place], I thought that amiodarone would probably be the best choice.  She is back in NSR today but HR is in the 40s at rest.   - Decrease amiodarone to 400 mg daily x 1 week then decrease to 200 mg daily.  She will need periodic monitoring of TSH and LFTs (will check today).  She will need yearly eye exams and a baseline set of PFTs.  I talked to her about the possible side effects from amiodarone.  However, will try  to maintain her on only a low dose.   - Given bradycardia, will decrease nebivolol to 5 mg daily.  3. CAD: Patient has not had any chest pain.  She has exertional dyspnea that seems to date from around the likely onset of her atrial fibrillation.  She had an ETT-Sestamibi recently that showed a small partially reversible anteroapical perfusion defect.  This may be a small area of ischemia but soft tissue attenuation is certainly also possible.  This does not sound like a high risk study.  Her abnormal renal function (creatinine 1.5 with BUN 70) makes cath risky.  I will see her  back in 2 wks.  If her exertional dyspnea is significantly improved in NSR, would probably continue to manage medically.  If she continues to be symptomatic and her renal fxn returns to a reasonable level, I will plan to cath her.  She had lipids drawn recently at [facility].  Will call for result.  4. HTN: BP stable.  As above, will be decreasing nebivolol.   5. Chronic diastolic CHF: Patient does not have significant JVD but she has developed significant ankle swelling since cutting back on Lasix.  Diastolic CHF likely plays a role, but use of amlodipine and venous insufficiency are probably also issues. - I will have her wear knee-high graded compression stockings.  - Check BMET/BNP today: if creatinine better, will consider adding back 20 mg Lasix in the evening.   Marca Ancona 2:06 PM 01/03/2012

## 2012-01-03 NOTE — Patient Instructions (Addendum)
Your physician recommends that you schedule a follow-up appointment in: 2 WEEKS WITH DR [facility]  DECREASE BYSTOLIC TO 5 MG ONCE DAILY  DECREASE AMIODARONE TO 200 MG TAKE TWO TABLETS ONCE DAILY X ONE WEEK THEN ONE TABLET ONCE DAILY  Your physician recommends that you HAVE LAB WORK TODAY  [facility]

## 2012-01-07 DIAGNOSIS — D751 Secondary polycythemia: Secondary | ICD-10-CM | POA: Diagnosis not present

## 2012-01-07 DIAGNOSIS — D582 Other hemoglobinopathies: Secondary | ICD-10-CM | POA: Diagnosis not present

## 2012-01-09 DIAGNOSIS — Z87898 Personal history of other specified conditions: Secondary | ICD-10-CM | POA: Diagnosis not present

## 2012-01-11 ENCOUNTER — Telehealth: Payer: Self-pay | Admitting: Cardiology

## 2012-01-11 DIAGNOSIS — I4891 Unspecified atrial fibrillation: Secondary | ICD-10-CM

## 2012-01-11 DIAGNOSIS — I10 Essential (primary) hypertension: Secondary | ICD-10-CM

## 2012-01-11 MED ORDER — NEBIVOLOL HCL 5 MG PO TABS: 5.0000 mg | ORAL_TABLET | Freq: Every day | ORAL | Status: DC

## 2012-01-11 NOTE — Telephone Encounter (Signed)
New Problem:    Patient called in needing a refill of her nebivolol (BYSTOLIC) 5 MG tablet called into her pharmacy at the new twice daily dose.  Please call back if you have any questions.

## 2012-01-11 NOTE — Telephone Encounter (Signed)
Spoke with pt. Nebivolol was decreased to 5mg  daily 01/03/12 due to bradycardia.

## 2012-01-14 ENCOUNTER — Telehealth: Payer: Self-pay | Admitting: *Deleted

## 2012-01-14 NOTE — Telephone Encounter (Signed)
Endo unit called Sue Lush) and changed arrival time for cardioversion for 12/11 to 11 am instead of 12 noon. Message sent to Dr.McLean and [name] L and changed on the card. Patient is aware of this.

## 2012-01-17 ENCOUNTER — Encounter: Payer: Self-pay | Admitting: *Deleted

## 2012-01-17 ENCOUNTER — Encounter: Payer: Self-pay | Admitting: Cardiology

## 2012-01-17 ENCOUNTER — Ambulatory Visit (INDEPENDENT_AMBULATORY_CARE_PROVIDER_SITE_OTHER): Payer: Medicare Other | Admitting: Cardiology

## 2012-01-17 VITALS — BP 118/70 | HR 75 | Ht 62.0 in | Wt 130.0 lb

## 2012-01-17 DIAGNOSIS — I2510 Atherosclerotic heart disease of native coronary artery without angina pectoris: Secondary | ICD-10-CM | POA: Diagnosis not present

## 2012-01-17 DIAGNOSIS — R0602 Shortness of breath: Secondary | ICD-10-CM

## 2012-01-17 DIAGNOSIS — N179 Acute kidney failure, unspecified: Secondary | ICD-10-CM

## 2012-01-17 DIAGNOSIS — I509 Heart failure, unspecified: Secondary | ICD-10-CM

## 2012-01-17 DIAGNOSIS — I10 Essential (primary) hypertension: Secondary | ICD-10-CM

## 2012-01-17 DIAGNOSIS — I4891 Unspecified atrial fibrillation: Secondary | ICD-10-CM | POA: Diagnosis not present

## 2012-01-17 DIAGNOSIS — I5032 Chronic diastolic (congestive) heart failure: Secondary | ICD-10-CM

## 2012-01-17 LAB — BASIC METABOLIC PANEL
Calcium: 10.5 mg/dL (ref 8.4–10.5)
Chloride: 99 mEq/L (ref 96–112)
Creatinine, Ser: 1.2 mg/dL (ref 0.4–1.2)

## 2012-01-17 MED ORDER — FUROSEMIDE 40 MG PO TABS: 40.0000 mg | ORAL_TABLET | Freq: Two times a day (BID) | ORAL | Status: DC

## 2012-01-17 MED ORDER — APIXABAN 2.5 MG PO TABS: 2.5000 mg | ORAL_TABLET | Freq: Two times a day (BID) | ORAL | Status: DC

## 2012-01-17 NOTE — Progress Notes (Signed)
Patient ID: Sarah Mays, female   DOB: Jan 24, 1931, 78 y.o.   MRN: [id number] PCP: Dr. Gwenevere Abbot in [place]  78 yo with persistent atrial fibrillation and chronic diastolic CHF returns for cardiology evaluation.  Patient has had some exertional dyspnea for over a year.  The dyspnea, however, became significantly worse in late September/early October.  She notes dyspnea walking up stairs or even gentle hills.  She is short of breath walking long distances.  She has had mild ankle edema for over a year.  Again, the edema became considerably worse in late September/early October.  She had an echo done showed preserved EF with mild pulmonary HTN.  On the echo, she was noted to be in atrial fibrillation.  This was confirmed with subsequent evaluations.  She has had no stroke-like symptoms.  No chest pain.  She was initially started on warfarin but stopped it due to nosebleeds.  She is currently on Eliquis at reduced dose.  She was started on Lasix because of the lower extremity swelling.  She was taking it at 40 mg bid.    At her initial appointment here, she was hyperkalemic with elevated BUN and creatinine (significantly above her baseline).  I had her hold Lasix for 3 days then cut it back to once daily.  I stopped olmesartan and supplemental K.  I also started her on amiodarone in preparation for DCCV.  I thought that given her moderately enlarged atria, her chances of maintaining NSR would not be good without an antiarrhythmic.  Since then, her ankles have swollen more, and I increased her Lasix back to 40 qam, 20 qpm.  Creatinine improved.  At last appointment she was in NSR but is back in atrial fibrillation today with controlled rate.  She denies dyspnea when walking short distances or going up a flight of steps.  She is short of breath walking longer distances such as around large stores.  No chest pain.  She has not worn compression stockings.    ECG: atrial fibrillation, rate 58  Labs  (7/13): K 4.7, creatinine 0.56 Labs (8/13): K 5.4, creatinine 0.69 Labs (11/13): TSH 6.45, K 5.4=>3.8, creatinine 1.5=>1.2, BUN 70=>35, HCT 33.2, BNP 866, AST 25, ALT 36  PMH: 1. Atrial fibrillation: Diagnosed initially in 10/13.  Holter monitor in 11/13 showed atrial fibrillation with average rate 65.  Back in NSR on amiodarone on 01/03/12.  In atrial fibrillation again on 12/9.  2. Chronic diastolic CHF: Echo (10/13) with EF 65-70%, mild MR, moderate biatrial enlargement, moderate TR, PA systolic pressure 45 mmHg.   3. ETT-Sestamibi (11/13): Exercised to stage II, small partially reversible anteroapical perfusion defect suggesting mild ischemia versus attenuation, EF 80%.  4. HTN 5. Polycythemia vera: Has had phlebotomy only once.  6. Hypothyroidism 7. Macular degeneration 8. TAH 1980 9. Familial hypocalciuric hypercalcemia  SH: Prior smoker (years ago).  Married, lives in [place].  Occasional ETOH.  Daughter works for [employer].   FH: CAD  ROS: All systems reviewed and negative except as per HPI.   Current Outpatient Prescriptions  Medication Sig Dispense Refill  . amiodarone (PACERONE) 200 MG tablet Take 200 mg by mouth daily.      [name] Kitchen amLODipine (NORVASC) 10 MG tablet Take 1 tablet (10 mg total) by mouth daily.  30 tablet  6  . apixaban (ELIQUIS) 2.5 MG TABS tablet Take 2.5 mg by mouth 2 (two) times daily.      . Cholecalciferol (VITAMIN D3) 400 UNITS CAPS Take 1  capsule by mouth daily.      . furosemide (LASIX) 40 MG tablet Take 1 tablet (40 mg total) by mouth daily.      [name] Kitchen glucosamine-chondroitin 500-400 MG tablet Take 1 tablet by mouth daily.      . hydroxyurea (HYDREA) 500 MG capsule Take by mouth. TAKE 1 CAPSULE ON Monday & Tuesday ALL OTHER DAYS TAKE 2 CAPSULES.   May take with food to minimize GI side effects.      [name] Kitchen levothyroxine (SYNTHROID, LEVOTHROID) 75 MCG tablet Take 75 mcg by mouth daily.      . Lutein 10 MG TABS Take 1 tablet by mouth daily.      . magnesium oxide  (MAG-OX) 400 MG tablet Take 400 mg by mouth daily.      . nebivolol (BYSTOLIC) 5 MG tablet Take 1 tablet (5 mg total) by mouth daily.  30 tablet  6  . [DISCONTINUED] amiodarone (PACERONE) 200 MG tablet 2 TABLETS ONCE DAILY X ONE WEEK THEN ONE TABLET ONCE DAILY  60 tablet  3    BP 118/70  Pulse 75  Ht 5\' 2"  (1.575 m)  Wt 130 lb (58.968 kg)  BMI 23.78 kg/m2  SpO2 93% General: NAD Neck: No JVD, no thyromegaly or thyroid nodule.  Lungs: Slight crackles at bases bilaterally.  CV: Nondisplaced PMI.  Heart irregular S1/S2, no S3/S4, 1/6 HSM LLSB.  2+ edema 1/2 up lower legs bilaterally.  No carotid bruit.  Abdomen: Soft, nontender, no hepatosplenomegaly, no distention.  Neurologic: Alert and oriented x 3.  Psych: Normal affect. Extremities: No clubbing or cyanosis.   Assessment/Plan:  1. Renal dysfunction: Suspect AKI initially in the setting of ARB + diuresis.  This improved with discontinuation of olmesartan and cutting back Lasix.  2. Atrial fibrillation: First noted in 10/13.  Her increased exertional dyspnea seems to date from around that time.  I suspect that the onset of atrial fibrillation probably triggered a diastolic CHF exacerbation. CHADSVASC score is 56 (age, female gender, HTN, CHF).  She is anticoagulated with Eliquis 2.5 mg bid.  I would agree with the reduced dose of Eliquis given her age, small size (< 60 kg), and renal dysfunction.  Given her moderately enlarged atria, I thought that she would need an antiarrhythmic for the best chance to maintain NSR after cardioversion.  As she lives considerably distant from [place], I thought that amiodarone would probably be the best choice.  At last appointment, she was in atrial fibrillation but is in atrial fibrillation today.  - Continue amiodarone 200 mg daily.  She will need periodic monitoring of TSH (TSH was recently low, report sent to PCP as she is on thyroid replacement) and LFTs.  She will need yearly eye exams and a baseline  set of PFTs, which I will arrange.   - She is set up for a cardioversion on Wednesday, which I will go ahead and do as she is in atrial fibrillation and has been anticoagulated for an appropriate amount of time.  If she does not maintain NSR, I will stop amiodarone.  If she feels considerably better in NSR and then goes back into atrial fibrillation, we can consider atrial fibrillation ablation attempt.   3. CAD: Patient has not had any chest pain.  She has exertional dyspnea that seems to date from around the likely onset of her atrial fibrillation.  She had an ETT-Sestamibi recently that showed a small partially reversible anteroapical perfusion defect.  This may be a small area  of ischemia but soft tissue attenuation is certainly also possible.  This does not sound like a high risk study.  Her abnormal renal function makes cath risky though more recently her creatinine has improved.  If her exertional dyspnea is significantly improved in NSR, would probably continue to manage medically.  If she continues to be symptomatic and her renal fxn remains at a reasonable level, I will plan to cath her.  She had lipids drawn recently at [facility].  Will call for result.  4. HTN: BP stable on current regimen.  5. Chronic diastolic CHF: Patient does not have significant JVD but she has developed significant ankle swelling.  Diastolic CHF likely plays a role (especially given elevated BNP), but use of amlodipine and venous insufficiency are probably also issues. - I will have her wear thigh-high graded compression stockings.  - Increase Lasix to 40 mg bid with BMET/BNP in 2 wks.   - If no improvement, can consider cutting back on amlodipine.   Marca Ancona 2:21 PM 01/17/2012

## 2012-01-17 NOTE — Patient Instructions (Addendum)
Increase lasix furosemide) to 40mg  two times a day.  Use compression stockings to help the swelling in your feet and legs. Put them on in the morning and take them off at night. I have given you a prescription for these.   Your physician recommends that you have lab work today--BMET.  Your physician has recommended that you have a Cardioversion (DCCV). Electrical Cardioversion uses a jolt of electricity to your heart either through paddles or wired patches attached to your chest. This is a controlled, usually prescheduled, procedure. Defibrillation is done under light anesthesia in the hospital, and you usually go home the day of the procedure. This is done to get your heart back into a normal rhythm. You are not awake for the procedure. Please see the instruction sheet given to you today. Wednesday December 11,2013 MAKE SURE YOU TAKE ELIQUIS THE MORNING OF THE CARDIOVERSION    Your physician recommends that you return for lab work 02/08/12--BMET.  Your physician has recommended that you have a pulmonary function test. Pulmonary Function Tests are a group of tests that measure how well air moves in and out of your lungs. Schedule this for 01/11/12 the same day you see Dr Shirlee Latch.  Your physician recommends that you schedule a follow-up appointment on December 31,2013 with Dr Shirlee Latch at [facility].

## 2012-01-18 ENCOUNTER — Telehealth: Payer: Self-pay | Admitting: Cardiology

## 2012-01-18 ENCOUNTER — Other Ambulatory Visit: Payer: Self-pay | Admitting: *Deleted

## 2012-01-18 MED ORDER — POTASSIUM CHLORIDE ER 10 MEQ PO TBCR: 10.0000 meq | EXTENDED_RELEASE_TABLET | Freq: Every day | ORAL | Status: DC

## 2012-01-18 NOTE — Telephone Encounter (Signed)
Pt as question re rx she just picked up

## 2012-01-18 NOTE — Telephone Encounter (Signed)
LMTCB

## 2012-01-18 NOTE — Telephone Encounter (Signed)
Spoke with pt about KCL prescription.

## 2012-01-19 ENCOUNTER — Encounter (HOSPITAL_COMMUNITY): Payer: Self-pay

## 2012-01-19 ENCOUNTER — Other Ambulatory Visit: Payer: Self-pay | Admitting: *Deleted

## 2012-01-19 ENCOUNTER — Encounter (HOSPITAL_COMMUNITY)
Admission: RE | Disposition: A | Discharge status: Home / Self Care | Payer: Self-pay | Source: Ambulatory Visit | Attending: Cardiology

## 2012-01-19 ENCOUNTER — Encounter (HOSPITAL_COMMUNITY): Payer: Self-pay | Admitting: Anesthesiology

## 2012-01-19 ENCOUNTER — Ambulatory Visit (HOSPITAL_COMMUNITY): Admit: 2012-01-19 | Payer: Self-pay | Admitting: Cardiology

## 2012-01-19 ENCOUNTER — Ambulatory Visit (HOSPITAL_COMMUNITY)
Admission: RE | Admit: 2012-01-19 | Discharge: 2012-01-19 | Disposition: A | Discharge status: Home / Self Care | Payer: Medicare Other | Source: Ambulatory Visit | Attending: Cardiology | Admitting: Cardiology

## 2012-01-19 DIAGNOSIS — I4891 Unspecified atrial fibrillation: Secondary | ICD-10-CM | POA: Insufficient documentation

## 2012-01-19 DIAGNOSIS — Z5309 Procedure and treatment not carried out because of other contraindication: Secondary | ICD-10-CM | POA: Diagnosis not present

## 2012-01-19 SURGERY — CARDIOVERSION
Anesthesia: Monitor Anesthesia Care

## 2012-01-19 MED ORDER — SODIUM CHLORIDE 0.9 % IJ SOLN: 3.0000 mL | INTRAMUSCULAR | Status: DC | PRN

## 2012-01-19 MED ORDER — SODIUM CHLORIDE 0.9 % IV SOLN: 250.0000 mL | INTRAVENOUS | Status: DC

## 2012-01-19 MED ORDER — SODIUM CHLORIDE 0.9 % IJ SOLN: 3.0000 mL | Freq: Two times a day (BID) | INTRAMUSCULAR | Status: DC

## 2012-01-19 MED ORDER — SODIUM CHLORIDE 0.9 % IV SOLN: INTRAVENOUS | Status: DC

## 2012-01-19 NOTE — H&P (Signed)
   Patient arrived at short stay today and was in NSR with PACs.  No cardioversion therefore.  Continue Eliquis and amiodarone.  I am going to have her see Dr. Johney Frame for consideration for atrial fibrillation ablation.    Marca Ancona 01/19/2012 1:42 PM

## 2012-01-21 ENCOUNTER — Telehealth: Payer: Self-pay | Admitting: Cardiology

## 2012-01-21 NOTE — Telephone Encounter (Signed)
New Problem:    Patient called in because her insurance company refused to cover her apixaban (ELIQUIS) 2.5 MG TABS tablet and would like a cheaper alternative to be prescribed for her.  They told the patient that they fax a request to our office yesterday but haven't had a response back.  Please call back.

## 2012-01-21 NOTE — Telephone Encounter (Signed)
Pt wants a less expensive alternative to eliquis but does not want to switch back to the coumadin.  She is requesting a call from [name], Dr Alford Highland nurse next week.  She thinks maybe [name] is already working on something.

## 2012-01-24 NOTE — Telephone Encounter (Signed)
Spoke with pt

## 2012-01-24 NOTE — Telephone Encounter (Signed)
Spoke with Nehemiah Settle and [employer] [place] [phone]. I am not able to complete prior authorization for Eliquis over the telephone. A form will be faxed to Dr Shirlee Latch to complete. The form should be received by the end of the day today. I spoke with pt and she is aware I am working on this. She has enough Eliquis samples to last until Friday 01/28/12.

## 2012-01-25 ENCOUNTER — Telehealth: Payer: Self-pay | Admitting: Cardiology

## 2012-01-25 NOTE — Telephone Encounter (Signed)
[facility] calling on status of prior auth for eliquis

## 2012-01-25 NOTE — Telephone Encounter (Signed)
Will forward to nurse to check on status, no eliquis samples available.

## 2012-01-26 NOTE — Telephone Encounter (Signed)
New Problem:    Called in needing additional info for the patient's apixaban (ELIQUIS) 2.5 MG TABS tablet.  Please call back

## 2012-01-26 NOTE — Telephone Encounter (Signed)
I spoke with Annlawrance at [facility] and 30 day supply of Eliquis 2.5mg  bid  did go through. Pt is aware 30 day supply is approved and I am still waiting for final authorization.

## 2012-01-26 NOTE — Telephone Encounter (Signed)
Spoke with Dee,Brittany, and Bethany at different times today at  [phone]. I have received authorization for a 30 days of Eliquis until urgent request for authorization is completed, which I am told should be within 24 hours.

## 2012-01-26 NOTE — Telephone Encounter (Signed)
See phone note dated 01/21/12

## 2012-01-27 DIAGNOSIS — I4891 Unspecified atrial fibrillation: Secondary | ICD-10-CM | POA: Diagnosis not present

## 2012-01-27 DIAGNOSIS — R5381 Other malaise: Secondary | ICD-10-CM | POA: Diagnosis not present

## 2012-01-27 DIAGNOSIS — R609 Edema, unspecified: Secondary | ICD-10-CM | POA: Diagnosis not present

## 2012-01-27 DIAGNOSIS — R5383 Other fatigue: Secondary | ICD-10-CM | POA: Diagnosis not present

## 2012-01-27 DIAGNOSIS — G4700 Insomnia, unspecified: Secondary | ICD-10-CM | POA: Diagnosis not present

## 2012-01-28 ENCOUNTER — Telehealth: Payer: Self-pay | Admitting: *Deleted

## 2012-01-28 NOTE — Telephone Encounter (Signed)
Annlawrance at [facility] aware Eliquis approved.

## 2012-01-28 NOTE — Telephone Encounter (Signed)
Annlawrance at [facility] aware Eliquis approved until 01/25/13.

## 2012-01-28 NOTE — Telephone Encounter (Signed)
Eliquis approved for 1 year --see phone note 01/28/12. Pt aware.

## 2012-01-28 NOTE — Telephone Encounter (Signed)
01/28/12--Eliquis approved through 01/25/13 by [facility] [phone].  Pt ID number [id number] certificate number [id number]. Pt aware.

## 2012-02-05 DIAGNOSIS — D45 Polycythemia vera: Secondary | ICD-10-CM | POA: Diagnosis not present

## 2012-02-08 ENCOUNTER — Other Ambulatory Visit (INDEPENDENT_AMBULATORY_CARE_PROVIDER_SITE_OTHER): Payer: Medicare Other

## 2012-02-08 ENCOUNTER — Ambulatory Visit (INDEPENDENT_AMBULATORY_CARE_PROVIDER_SITE_OTHER): Payer: Medicare Other | Admitting: Internal Medicine

## 2012-02-08 ENCOUNTER — Ambulatory Visit (INDEPENDENT_AMBULATORY_CARE_PROVIDER_SITE_OTHER): Payer: Medicare Other | Admitting: Cardiology

## 2012-02-08 ENCOUNTER — Encounter: Payer: Self-pay | Admitting: Cardiology

## 2012-02-08 VITALS — BP 115/70 | HR 55 | Wt 127.0 lb

## 2012-02-08 DIAGNOSIS — I4891 Unspecified atrial fibrillation: Secondary | ICD-10-CM | POA: Diagnosis not present

## 2012-02-08 DIAGNOSIS — I5032 Chronic diastolic (congestive) heart failure: Secondary | ICD-10-CM | POA: Diagnosis not present

## 2012-02-08 DIAGNOSIS — I2510 Atherosclerotic heart disease of native coronary artery without angina pectoris: Secondary | ICD-10-CM | POA: Diagnosis not present

## 2012-02-08 DIAGNOSIS — R0989 Other specified symptoms and signs involving the circulatory and respiratory systems: Secondary | ICD-10-CM

## 2012-02-08 DIAGNOSIS — I10 Essential (primary) hypertension: Secondary | ICD-10-CM

## 2012-02-08 DIAGNOSIS — I509 Heart failure, unspecified: Secondary | ICD-10-CM

## 2012-02-08 DIAGNOSIS — R0602 Shortness of breath: Secondary | ICD-10-CM | POA: Diagnosis not present

## 2012-02-08 LAB — CBC WITH DIFFERENTIAL/PLATELET
Eosinophils Relative: 0 % (ref 0–5)
HCT: 36.5 % (ref 36.0–46.0)
Lymphocytes Relative: 35 % (ref 12–46)
Lymphs Abs: 1.6 10*3/uL (ref 0.7–4.0)
MCV: 116.2 fL — ABNORMAL HIGH (ref 78.0–100.0)
Monocytes Absolute: 0.7 10*3/uL (ref 0.1–1.0)
Platelets: 193 10*3/uL (ref 150–400)
RBC: 3.14 MIL/uL — ABNORMAL LOW (ref 3.87–5.11)
WBC: 4.7 10*3/uL (ref 4.0–10.5)

## 2012-02-08 LAB — PULMONARY FUNCTION TEST

## 2012-02-08 LAB — BASIC METABOLIC PANEL
BUN: 56 mg/dL — ABNORMAL HIGH (ref 6–23)
CO2: 33 mEq/L — ABNORMAL HIGH (ref 19–32)
Glucose, Bld: 106 mg/dL — ABNORMAL HIGH (ref 70–99)
Potassium: 2.8 mEq/L — ABNORMAL LOW (ref 3.5–5.3)
Sodium: 135 mEq/L (ref 135–145)

## 2012-02-08 MED ORDER — HYDRALAZINE HCL 25 MG PO TABS: 25.0000 mg | ORAL_TABLET | Freq: Three times a day (TID) | ORAL | Status: DC

## 2012-02-08 NOTE — Patient Instructions (Addendum)
Stop metolazone.   Stop amlodipine.   Start hydralazine 25mg  three times a day.  Use compression stockings to help the swelling in your feet and legs. Put them on in the morning and take them off at night.   Your physician recommends that you have lab work today--BMET/BNP/CBCd  Your physician recommends that you schedule a follow-up appointment in: 2 weeks with Dr Shirlee Latch.

## 2012-02-08 NOTE — Progress Notes (Signed)
PFT done today. 

## 2012-02-09 DIAGNOSIS — Z87898 Personal history of other specified conditions: Secondary | ICD-10-CM | POA: Diagnosis not present

## 2012-02-09 NOTE — Progress Notes (Signed)
Patient ID: Sarah Mays, female   DOB: [birth year], 79 y.o.   MRN: [id number] PCP: Dr. Gwenevere Abbot in [place]  79 yo with paroxysmal atrial fibrillation and chronic diastolic CHF returns for cardiology evaluation.  Patient has had some exertional dyspnea for over a year.  The dyspnea, however, became significantly worse in late September/early October.  She notes dyspnea walking up stairs or even gentle hills.  She is short of breath walking long distances.  She has had mild ankle edema for over a year.  Again, the edema became considerably worse in late September/early October.  She had an echo done showed preserved EF with mild pulmonary HTN.  On the echo, she was noted to be in atrial fibrillation.  This was confirmed with subsequent evaluations.  She has had no stroke-like symptoms.  No chest pain.  She was initially started on warfarin but stopped it due to nosebleeds.  She is currently on Eliquis at reduced dose.  She was started on Lasix because of the lower extremity swelling.   I started her on amiodarone in preparation for DCCV.  I thought that given her moderately enlarged atria, her chances of maintaining NSR would not be good without an antiarrhythmic.  She actually went back into NSR just on amiodarone and did not require DCCV.  She denies chest pain and says that she has not been having significant exertional dyspnea though she is not very active.  She is in NSR with PVCs today.  She has continued to have lower leg edema.  Her PCP started her on metolazone 5 mg bid along with the 40 mg bid of Lasix because of lower extremity edema.  She has been taking this for about a week.  Weight is down 3 lbs since last appointment.   ECG: NSR with bigeminal PVCs, 1st degree AV block, iRBBB  Labs (7/13): K 4.7, creatinine 0.56 Labs (8/13): K 5.4, creatinine 0.69 Labs (11/13): TSH 6.45, K 5.4=>3.8, creatinine 1.5=>1.2, BUN 70=>35, HCT 33.2, BNP 866, AST 25, ALT 36 Labs (12/13): K 3.5,  creatinine 1.2  PMH: 1. Atrial fibrillation: Diagnosed initially in 10/13.  Holter monitor in 11/13 showed atrial fibrillation with average rate 65.  Back in NSR on amiodarone.   2. Chronic diastolic CHF: Echo (10/13) with EF 65-70%, mild MR, moderate biatrial enlargement, moderate TR, PA systolic pressure 45 mmHg.   3. ETT-Sestamibi (11/13): Exercised to stage II, small partially reversible anteroapical perfusion defect suggesting mild ischemia versus attenuation, EF 80%.  4. HTN 5. Polycythemia vera: Has had phlebotomy only once.  6. Hypothyroidism 7. Macular degeneration 8. TAH 1980 9. Familial hypocalciuric hypercalcemia  SH: Prior smoker (years ago).  Married, lives in [place].  Occasional ETOH.  Daughter works for [employer].   FH: CAD  ROS: All systems reviewed and negative except as per HPI.   Current Outpatient Prescriptions  Medication Sig Dispense Refill  . amiodarone (PACERONE) 200 MG tablet Take 200 mg by mouth daily.      [name] Kitchen apixaban (ELIQUIS) 2.5 MG TABS tablet Take 1 tablet (2.5 mg total) by mouth 2 (two) times daily.  60 tablet  6  . Cholecalciferol (VITAMIN D3) 400 UNITS CAPS Take 1 capsule by mouth daily.      . furosemide (LASIX) 40 MG tablet Take 1 tablet (40 mg total) by mouth 2 (two) times daily.  60 tablet  6  . glucosamine-chondroitin 500-400 MG tablet Take 1 tablet by mouth daily.      . hydroxyurea (  HYDREA) 500 MG capsule Take by mouth. TAKE 1 CAPSULE ON Monday & Tuesday ALL OTHER DAYS TAKE 2 CAPSULES.   May take with food to minimize GI side effects.      [name] Kitchen levothyroxine (SYNTHROID, LEVOTHROID) 75 MCG tablet Take 75 mcg by mouth daily.      . Lutein 10 MG TABS Take 1 tablet by mouth daily.      . magnesium oxide (MAG-OX) 400 MG tablet Take 400 mg by mouth daily.      . nebivolol (BYSTOLIC) 5 MG tablet Take 1 tablet (5 mg total) by mouth daily.  30 tablet  6  . potassium chloride (K-DUR) 10 MEQ tablet Take 1 tablet (10 mEq total) by mouth daily.  30 tablet  6   . temazepam (RESTORIL) 15 MG capsule Take 15 mg by mouth at bedtime as needed.      . hydrALAZINE (APRESOLINE) 25 MG tablet Take 1 tablet (25 mg total) by mouth 3 (three) times daily.  90 tablet  3    BP 115/70  Pulse 55  Wt 127 lb (57.607 kg) General: NAD Neck: No JVD, no thyromegaly or thyroid nodule.  Lungs: Slight crackles at bases bilaterally.  CV: Nondisplaced PMI.  Heart irregular S1/S2, no S3/S4, 1/6 HSM LLSB.  2+ edema at ankles bilaterally.  No carotid bruit.  Abdomen: Soft, nontender, no hepatosplenomegaly, no distention.  Neurologic: Alert and oriented x 3.  Psych: Normal affect. Extremities: No clubbing or cyanosis.   Assessment/Plan:   1. Atrial fibrillation: First noted in 10/13.  Her increased exertional dyspnea seems to date from around that time.  I suspect that the onset of atrial fibrillation probably triggered a diastolic CHF exacerbation. CHADSVASC score is 44 (age, female gender, HTN, CHF).  She is anticoagulated with Eliquis 2.5 mg bid.  I would agree with the reduced dose of Eliquis given her age, small size (< 60 kg), and renal dysfunction.  Given her moderately enlarged atria, I thought that she would need an antiarrhythmic for the best chance to maintain NSR after cardioversion.  As she lives considerably distant from [place], I thought that amiodarone would probably be the best choice.  She currently is holding NSR on amiodarone.  - Continue amiodarone 200 mg daily.  She will need periodic monitoring of TSH (TSH was recently low, report sent to PCP as she is on thyroid replacement) and LFTs.  She will need yearly eye exams.  Baseline PFTs were done today.  - She seems to be symptomatic with atrial fibrillation.  Long-term, I would like to get her off amiodarone.  I am going to have her see Dr. Johney Frame later this month to consider atrial fibrillation ablation. 2. CAD: Patient has not had any chest pain.  She has had exertional dyspnea that seems to date from around  the likely onset of her atrial fibrillation.  She had an ETT-Sestamibi recently that showed a small partially reversible anteroapical perfusion defect.  This may be a small area of ischemia but soft tissue attenuation is certainly also possible.  This does not sound like a high risk study.  Her abnormal renal function makes cath risky though more recently her creatinine has improved.  Her exertional dyspnea seems to have improved now that she is back in NSR.  Would continue to manage medically.   3. Chronic diastolic CHF: Patient does not have significant JVD but she has developed significant ankle swelling, worse over the last few months.  Diastolic CHF likely plays a  role (especially given elevated BNP), but use of amlodipine and venous insufficiency are probably also issues.  She was started on bid metolazone recently by her PCP.  - I want her to stop metolazone.  I am concerned that her K may be very low with the frequent ventricular ectopy that she is having currently, I am also concerned that her renal function may be considerably worsened.  For now, she can continue Lasix 40 mg bid.  If she were to need metolazone in the future, would use less frequently and at lower dose.   - Check BMET today.  - I will have her wear knee-high graded compression stockings to help with the ankle edema.  - Stop amlodipine and use hydralazine instead for BP management.  She is not on ACEI/ARB due to renal disease.   Marca Ancona 12:11 AM 02/09/2012

## 2012-02-10 ENCOUNTER — Other Ambulatory Visit: Payer: Self-pay | Admitting: *Deleted

## 2012-02-10 DIAGNOSIS — E876 Hypokalemia: Secondary | ICD-10-CM

## 2012-02-10 DIAGNOSIS — R5383 Other fatigue: Secondary | ICD-10-CM | POA: Diagnosis not present

## 2012-02-10 DIAGNOSIS — G4700 Insomnia, unspecified: Secondary | ICD-10-CM | POA: Diagnosis not present

## 2012-02-10 DIAGNOSIS — R609 Edema, unspecified: Secondary | ICD-10-CM | POA: Diagnosis not present

## 2012-02-10 MED ORDER — POTASSIUM CHLORIDE CRYS ER 20 MEQ PO TBCR: 20.0000 meq | EXTENDED_RELEASE_TABLET | Freq: Every day | ORAL | Status: DC

## 2012-02-14 DIAGNOSIS — H353290 Exudative age-related macular degeneration, unspecified eye, stage unspecified: Secondary | ICD-10-CM | POA: Diagnosis not present

## 2012-02-14 DIAGNOSIS — H353190 Nonexudative age-related macular degeneration, unspecified eye, stage unspecified: Secondary | ICD-10-CM | POA: Diagnosis not present

## 2012-02-14 DIAGNOSIS — H35059 Retinal neovascularization, unspecified, unspecified eye: Secondary | ICD-10-CM | POA: Diagnosis not present

## 2012-02-14 DIAGNOSIS — H35369 Drusen (degenerative) of macula, unspecified eye: Secondary | ICD-10-CM | POA: Diagnosis not present

## 2012-02-21 ENCOUNTER — Encounter: Payer: Self-pay | Admitting: Cardiology

## 2012-02-22 ENCOUNTER — Ambulatory Visit (INDEPENDENT_AMBULATORY_CARE_PROVIDER_SITE_OTHER): Payer: Medicare Other | Admitting: Cardiology

## 2012-02-22 ENCOUNTER — Encounter: Payer: Self-pay | Admitting: Cardiology

## 2012-02-22 ENCOUNTER — Other Ambulatory Visit (INDEPENDENT_AMBULATORY_CARE_PROVIDER_SITE_OTHER): Payer: Medicare Other

## 2012-02-22 VITALS — BP 152/82 | HR 47 | Ht 62.0 in | Wt 129.0 lb

## 2012-02-22 DIAGNOSIS — I5032 Chronic diastolic (congestive) heart failure: Secondary | ICD-10-CM

## 2012-02-22 DIAGNOSIS — R0602 Shortness of breath: Secondary | ICD-10-CM

## 2012-02-22 DIAGNOSIS — I4891 Unspecified atrial fibrillation: Secondary | ICD-10-CM

## 2012-02-22 DIAGNOSIS — I10 Essential (primary) hypertension: Secondary | ICD-10-CM | POA: Diagnosis not present

## 2012-02-22 DIAGNOSIS — R0989 Other specified symptoms and signs involving the circulatory and respiratory systems: Secondary | ICD-10-CM

## 2012-02-22 DIAGNOSIS — I509 Heart failure, unspecified: Secondary | ICD-10-CM

## 2012-02-22 LAB — BASIC METABOLIC PANEL
CO2: 30 mEq/L (ref 19–32)
Chloride: 98 mEq/L (ref 96–112)
Creatinine, Ser: 1 mg/dL (ref 0.4–1.2)
Potassium: 3.9 mEq/L (ref 3.5–5.1)

## 2012-02-22 LAB — HEPATIC FUNCTION PANEL
ALT: 21 U/L (ref 0–35)
Alkaline Phosphatase: 54 U/L (ref 39–117)
Bilirubin, Direct: 0.2 mg/dL (ref 0.0–0.3)
Total Bilirubin: 1.1 mg/dL (ref 0.3–1.2)
Total Protein: 7.6 g/dL (ref 6.0–8.3)

## 2012-02-22 LAB — LIPID PANEL
Total CHOL/HDL Ratio: 2
Triglycerides: 92 mg/dL (ref 0.0–149.0)

## 2012-02-22 MED ORDER — NEBIVOLOL HCL 2.5 MG PO TABS: 2.5000 mg | ORAL_TABLET | Freq: Every day | ORAL | Status: DC

## 2012-02-22 MED ORDER — HYDRALAZINE HCL 50 MG PO TABS: 50.0000 mg | ORAL_TABLET | Freq: Three times a day (TID) | ORAL | Status: DC

## 2012-02-22 NOTE — Patient Instructions (Addendum)
Decrease nebivolol (bystolic)  to 2.5mg  daily. You can take 1/2 of your 5mg  tablet daily and use your current supply.  Increase hydralazine to 50mg  three times a day. You can take 2 of your 25mg  tablets three times a day and use your current supply.  Your physician recommends that you have  lab work today--BNP/BMET/Lipid profile/Liver profile.   Your physician wants you to follow-up in: 3 months with Dr Shirlee Latch. (April 2014).  You will receive a reminder letter in the mail two months in advance. If you don't receive a letter, please call our office to schedule the follow-up appointment.

## 2012-02-23 NOTE — Progress Notes (Signed)
Patient ID: Sarah Mays, female   DOB: Apr 19, 1930, 79 y.o.   MRN: [id number] PCP: Dr. Gwenevere Abbot in [place]  79 yo with paroxysmal atrial fibrillation and chronic diastolic CHF returns for cardiology evaluation.  Patient has had some exertional dyspnea for over a year.  The dyspnea, however, became significantly worse in late September/early October.  She notes dyspnea walking up stairs or even gentle hills.  She is short of breath walking long distances.  She has had mild ankle edema for over a year.  Again, the edema became considerably worse in late September/early October.  She had an echo done showed preserved EF with mild pulmonary HTN.  On the echo, she was noted to be in atrial fibrillation.  This was confirmed with subsequent evaluations.  She has had no stroke-like symptoms.  No chest pain.  She was initially started on warfarin but stopped it due to nosebleeds.  She is currently on Eliquis at reduced dose.  She was started on Lasix because of the lower extremity swelling.   I started her on amiodarone in preparation for DCCV.  I thought that given her moderately enlarged atria, her chances of maintaining NSR would not be good without an antiarrhythmic.  She actually went back into NSR just on amiodarone and did not require DCCV.  She is in sinus bradycardia today.  She denies chest pain and says that she has not been having significant exertional dyspnea though she is not very active.  Creatinine was high and K was low after last appointment (had been taking metolazone).  I had her stop metolazone and also amlodipine.  Her lower extremity edema has almost completely resolved.  Weight is stable.    ECG: NSR, PACs, iRBBB with rate 46  Labs (7/13): K 4.7, creatinine 0.56 Labs (8/13): K 5.4, creatinine 0.69 Labs (11/13): TSH 6.45, K 5.4=>3.8, creatinine 1.5=>1.2, BUN 70=>35, HCT 33.2, BNP 866, AST 25, ALT 36 Labs (12/13): K 3.5 => 2.8, creatinine 1.2 => 1.33, BUN 56  PMH: 1. Atrial  fibrillation: Diagnosed initially in 10/13.  Holter monitor in 11/13 showed atrial fibrillation with average rate 65.  Back in NSR on amiodarone.   2. Chronic diastolic CHF: Echo (10/13) with EF 65-70%, mild MR, moderate biatrial enlargement, moderate TR, PA systolic pressure 45 mmHg.   3. ETT-Sestamibi (11/13): Exercised to stage II, small partially reversible anteroapical perfusion defect suggesting mild ischemia versus attenuation, EF 80%.  4. HTN: Lower extremity swelling with amlodipine.  5. Polycythemia vera: Has had phlebotomy only once.  6. Hypothyroidism 7. Macular degeneration 8. TAH 1980 9. Familial hypocalciuric hypercalcemia 10. PFTs (amiodarone use) in 1/14 showed a mild obstructive defect.   SH: Prior smoker (years ago).  Married, lives in [place].  Occasional ETOH.  Daughter works for [employer].   FH: CAD  ROS: All systems reviewed and negative except as per HPI.   Current Outpatient Prescriptions  Medication Sig Dispense Refill  . amiodarone (PACERONE) 200 MG tablet Take 200 mg by mouth daily.      [name] Kitchen apixaban (ELIQUIS) 2.5 MG TABS tablet Take 1 tablet (2.5 mg total) by mouth 2 (two) times daily.  60 tablet  6  . Cholecalciferol (VITAMIN D3) 400 UNITS CAPS Take 1 capsule by mouth daily.      . furosemide (LASIX) 40 MG tablet Take 1 tablet (40 mg total) by mouth 2 (two) times daily.  60 tablet  6  . glucosamine-chondroitin 500-400 MG tablet Take 1 tablet by mouth daily.      [name] Kitchen  hydroxyurea (HYDREA) 500 MG capsule Take by mouth. TAKE 1 CAPSULE ON Monday & Tuesday ALL OTHER DAYS TAKE 2 CAPSULES.   May take with food to minimize GI side effects.      [name] Kitchen levothyroxine (SYNTHROID, LEVOTHROID) 75 MCG tablet Take 75 mcg by mouth daily.      . Lutein 10 MG TABS Take 1 tablet by mouth daily.      . magnesium oxide (MAG-OX) 400 MG tablet Take 400 mg by mouth daily.      . potassium chloride SA (K-DUR,KLOR-CON) 20 MEQ tablet Take 1 tablet (20 mEq total) by mouth daily.  30 tablet  6    . temazepam (RESTORIL) 15 MG capsule Take 15 mg by mouth at bedtime as needed.      . hydrALAZINE (APRESOLINE) 50 MG tablet Take 1 tablet (50 mg total) by mouth 3 (three) times daily.  270 tablet  3  . nebivolol (BYSTOLIC) 2.5 MG tablet Take 1 tablet (2.5 mg total) by mouth daily.  90 tablet  3    BP 152/82  Pulse 47  Ht 5\' 2"  (1.575 m)  Wt 129 lb (58.514 kg)  BMI 23.59 kg/m2  SpO2 96% General: NAD Neck: No JVD, no thyromegaly or thyroid nodule.  Lungs: Slight crackles at bases bilaterally.  CV: Nondisplaced PMI.  Heart irregular S1/S2, no S3/S4, 1/6 HSM LLSB.  2+ edema at ankles bilaterally.  No carotid bruit.  Abdomen: Soft, nontender, no hepatosplenomegaly, no distention.  Neurologic: Alert and oriented x 3.  Psych: Normal affect. Extremities: No clubbing or cyanosis.   Assessment/Plan:   1. Atrial fibrillation: First noted in 10/13.  Her increased exertional dyspnea seems to date from around that time.  I suspect that the onset of atrial fibrillation probably triggered a diastolic CHF exacerbation. CHADSVASC score is 51 (age, female gender, HTN, CHF).  She is anticoagulated with Eliquis 2.5 mg bid.  I would agree with the reduced dose of Eliquis given her age, small size (< 60 kg), and renal dysfunction.  Given her moderately enlarged atria, I thought that she would need an antiarrhythmic for the best chance to maintain NSR after cardioversion.  As she lives considerably distant from [place], I thought that amiodarone would probably be the best choice.  She currently is holding NSR on amiodarone but is mildly bradycardic.  - Continue amiodarone 200 mg daily.  She needs LFTs today, TSH will be followed by PCP given pre-existing hypothyroidism.  She will need yearly eye exams.  Baseline PFTs showed a mild obstructive defect.  - Decrease nebivolol to 2.5 mg daily given bradycardia.  - She seems to be symptomatic with atrial fibrillation.  Long-term, I would like to get her off  amiodarone.  I am going to have her see Dr. Johney Frame later this month to consider atrial fibrillation ablation. 2. CAD: Patient has not had any chest pain.  She has had exertional dyspnea that seems to date from around the likely onset of her atrial fibrillation.  She had an ETT-Sestamibi recently that showed a small partially reversible anteroapical perfusion defect.  This may be a small area of ischemia but soft tissue attenuation is certainly also possible.  This does not sound like a high risk study.  Her abnormal renal function makes cath risky though more recently her creatinine has improved.  Her exertional dyspnea seems to have improved now that she is back in NSR.  Would continue to manage medically.   3. Chronic diastolic CHF: No JVD and  leg edema resolved after stopping amlodipine.  - Continue current Lasix at 40 mg bid.  - Will get BMET today to see if K and creatinine have improved since metolazone was stopped.  4. HTN: Increase hydralazine to 50 mg tid given elevated blood pressure.   Marca Ancona 12:10 AM 02/23/2012

## 2012-02-24 DIAGNOSIS — D2370 Other benign neoplasm of skin of unspecified lower limb, including hip: Secondary | ICD-10-CM | POA: Diagnosis not present

## 2012-02-24 DIAGNOSIS — D485 Neoplasm of uncertain behavior of skin: Secondary | ICD-10-CM | POA: Diagnosis not present

## 2012-02-25 DIAGNOSIS — D45 Polycythemia vera: Secondary | ICD-10-CM | POA: Diagnosis not present

## 2012-02-25 DIAGNOSIS — D582 Other hemoglobinopathies: Secondary | ICD-10-CM | POA: Diagnosis not present

## 2012-03-02 ENCOUNTER — Telehealth: Payer: Self-pay | Admitting: Cardiology

## 2012-03-02 DIAGNOSIS — R221 Localized swelling, mass and lump, neck: Secondary | ICD-10-CM | POA: Diagnosis not present

## 2012-03-02 DIAGNOSIS — G4700 Insomnia, unspecified: Secondary | ICD-10-CM | POA: Diagnosis not present

## 2012-03-02 DIAGNOSIS — I4891 Unspecified atrial fibrillation: Secondary | ICD-10-CM | POA: Diagnosis not present

## 2012-03-02 DIAGNOSIS — E039 Hypothyroidism, unspecified: Secondary | ICD-10-CM | POA: Diagnosis not present

## 2012-03-02 DIAGNOSIS — I10 Essential (primary) hypertension: Secondary | ICD-10-CM

## 2012-03-02 DIAGNOSIS — R609 Edema, unspecified: Secondary | ICD-10-CM | POA: Diagnosis not present

## 2012-03-02 LAB — TSH: TSH: 2.15 u[IU]/mL (ref <=5.90)

## 2012-03-02 NOTE — Telephone Encounter (Signed)
She can increase the hydralazine to 75 mg three times a day.  Would like to see SBP at least < 150.

## 2012-03-02 NOTE — Telephone Encounter (Signed)
Called patient and she advised that BP on Monday and Tuesday was 151/70 and yesterday it was 155/71. She was not able to report her heart rate. She states that she has occassional vague non exertional chest discomfort when sitting in the chair. No other symptoms. No complaints of tachycardia. Advised that her BP has improved since last office visit. She will continue to take her BP every day and call back on Monday to report findings. Will forward to Dr.McLean and Katina Dung RN.

## 2012-03-02 NOTE — Telephone Encounter (Signed)
New problem:  Aware that nurse is off today.    C/O B/p  155/71 on yesterday. Some minor chest pain on yesterday.

## 2012-03-02 NOTE — Telephone Encounter (Signed)
Patient aware to increase hydralazine to 75 mg 3 times per day per Dr.McLean. She will call back on 1/27 to report new BP readings.

## 2012-03-02 NOTE — Addendum Note (Signed)
Addended by: Gerome Apley on: 03/02/2012 10:33 AM   Modules accepted: Orders

## 2012-03-06 ENCOUNTER — Telehealth: Payer: Self-pay | Admitting: Cardiology

## 2012-03-06 NOTE — Telephone Encounter (Signed)
Pt calling per dr Shirlee Latch with update, pls call [phone]

## 2012-03-06 NOTE — Telephone Encounter (Signed)
Spoke with pt. Pt calling with BP readings since hydralazine increased to 75mg  three times a day 03/02/12. BP 03/03/12 136/67 and 03/05/12 119/63. Pt does not have any new symptoms to report since hydralazine was increased. I will forward to Dr Shirlee Latch for review.

## 2012-03-07 NOTE — Telephone Encounter (Signed)
Pt advised, verbalized understanding. Pt will continue to check her BP and call with readings in 2 weeks.

## 2012-03-07 NOTE — Telephone Encounter (Signed)
Better no changes

## 2012-03-09 ENCOUNTER — Ambulatory Visit (INDEPENDENT_AMBULATORY_CARE_PROVIDER_SITE_OTHER): Payer: Medicare Other | Admitting: Internal Medicine

## 2012-03-09 ENCOUNTER — Encounter: Payer: Self-pay | Admitting: Internal Medicine

## 2012-03-09 ENCOUNTER — Encounter: Payer: Self-pay | Admitting: *Deleted

## 2012-03-09 VITALS — BP 124/54 | HR 67 | Ht 62.0 in | Wt 127.8 lb

## 2012-03-09 DIAGNOSIS — I498 Other specified cardiac arrhythmias: Secondary | ICD-10-CM | POA: Diagnosis not present

## 2012-03-09 DIAGNOSIS — I509 Heart failure, unspecified: Secondary | ICD-10-CM

## 2012-03-09 DIAGNOSIS — I4891 Unspecified atrial fibrillation: Secondary | ICD-10-CM

## 2012-03-09 DIAGNOSIS — I5032 Chronic diastolic (congestive) heart failure: Secondary | ICD-10-CM | POA: Diagnosis not present

## 2012-03-09 DIAGNOSIS — I495 Sick sinus syndrome: Secondary | ICD-10-CM | POA: Insufficient documentation

## 2012-03-09 DIAGNOSIS — R001 Bradycardia, unspecified: Secondary | ICD-10-CM

## 2012-03-09 NOTE — Patient Instructions (Signed)
Your physician recommends that you schedule a follow-up appointment as needed with Dr Allred and as scheduled with Dr McLean  

## 2012-03-09 NOTE — Assessment & Plan Note (Signed)
Presently appears to be without symptoms If she develops symptoms of bradycardia, then she might do well with amiodarone 100mg  daily (as above) or stopping bystolic

## 2012-03-09 NOTE — Assessment & Plan Note (Signed)
The patient has atrial fibrillation for which she has been treated with amiodarone.  Presently, she is maintaining sinus rhythm and appears to be tolerating amiodarone reasonably well.  She has had some bradycardia, though not overtly symptomatic. Therapeutic strategies for afib including medicine and ablation were discussed in detail with the patient today. Risk, benefits, and alternatives to EP study and radiofrequency ablation for afib were also discussed in detail today.  I think that at this point, given her advanced age and good response to amiodarone that I would be in favor of continuing amiodarone with close follow-up by Dr Shirlee Latch.  If she remains in sinus rhythm over the next few months, then he could reduce her amiodarone to 100mg  daily.  I think that for her, this could be a safe and effective dose long term.  I did discuss the risks of amiodarone at length today with the patient, her spouse, and also her daughter.  At this time, then would be in agreement with ongoing medical therapy. She will continue eliquis for anticoagulation.

## 2012-03-09 NOTE — Progress Notes (Signed)
PCP: Dr. Gwenevere Abbot in [place] Referring Physician:  Dr Carmina Miller is a 79 y.o. female with a h/o atrial fibrillation who presents today for Ep consultation.  She reports that she was initially diagnosed with atrial fibrillation this past fall (late September/early October) when she developed progressive CHF and presented for an echo.  The echo showed preserved EF with mild pulmonary HTN.  On the echo, she was noted to be in atrial fibrillation.  This was confirmed with subsequent evaluations. She was initially started on warfarin but stopped it due to nosebleeds.  She was evaluated by Dr Shirlee Latch and placed on eliquis.  She then began amiodarone and converted to sinus rhythm.  Since that time, her CHF symptoms have significantly improved.  She has occasional SOB but feels mostly back to her baseline.  Her edema is improved.  She has had sinus bradycardia for which she has not been very symptomatic. Today, she denies symptoms of palpitations, chest pain, orthopnea, PND, dizziness, presyncope, syncope, or neurologic sequela. The patient is tolerating medications without difficulties and is otherwise without complaint today.   Past Medical History  Diagnosis Date  . Atrial fibrillation   . Hypertension   . Mitral valve disorders   . Tricuspid valve disorders, specified as nonrheumatic   . Atrial fibrillation   . Diastolic dysfunction   . Shortness of breath   . Peripheral edema   . Neck mass     righ  . Hypothyroidism   . Macular degeneration   . Osteoporosis    Past Surgical History  Procedure Date  . Breast biopsy 06/18/1998    left  . Total abdominal hysterectomy   . Tonsillectomy   . Breast biopsy     Current Outpatient Prescriptions  Medication Sig Dispense Refill  . amiodarone (PACERONE) 200 MG tablet Take 200 mg by mouth daily.      [name] Kitchen apixaban (ELIQUIS) 2.5 MG TABS tablet Take 1 tablet (2.5 mg total) by mouth 2 (two) times daily.  60 tablet  6  .  Cholecalciferol (VITAMIN D3) 400 UNITS CAPS Take 1 capsule by mouth daily.      . furosemide (LASIX) 40 MG tablet Take 1 tablet (40 mg total) by mouth 2 (two) times daily.  60 tablet  6  . glucosamine-chondroitin 500-400 MG tablet Take 1 tablet by mouth daily.      . hydrALAZINE (APRESOLINE) 50 MG tablet One and one half tabs 3 times per day      . hydroxyurea (HYDREA) 500 MG capsule Take by mouth. TAKE 1 CAPSULE ON Monday & Tuesday ALL OTHER DAYS TAKE 2 CAPSULES.   May take with food to minimize GI side effects.      [name] Kitchen levothyroxine (SYNTHROID, LEVOTHROID) 75 MCG tablet Take 75 mcg by mouth daily.      . Lutein 10 MG TABS Take 1 tablet by mouth daily.      . magnesium oxide (MAG-OX) 400 MG tablet Take 400 mg by mouth daily.      . nebivolol (BYSTOLIC) 2.5 MG tablet Take 1 tablet (2.5 mg total) by mouth daily.  90 tablet  3  . potassium chloride SA (K-DUR,KLOR-CON) 20 MEQ tablet Take 1 tablet (20 mEq total) by mouth daily.  30 tablet  6  . temazepam (RESTORIL) 15 MG capsule Take 15 mg by mouth at bedtime as needed.        No Known Allergies  History   Social History  . Marital  Status: Married    Spouse Name: N/A    Number of Children: N/A  . Years of Education: N/A   Occupational History  . Not on file.   Social History Main Topics  . Smoking status: Former Smoker    Types: Cigarettes    Quit date: 01/19/1984  . Smokeless tobacco: Never Used  . Alcohol Use: Yes     Comment: 2 per day/ 14 a week  . Drug Use: No  . Sexually Active: Not on file   Other Topics Concern  . Not on file   Social History Narrative  . No narrative on file    Family History  Problem Relation Age of Onset  . Hypertension Father   . Heart disease Father   . Breast cancer Daughter   . Ovarian cancer Mother     ROS- All systems are reviewed and negative except as per the HPI above  Physical Exam: Filed Vitals:   03/09/12 1454  BP: 124/54  Pulse: 67  Height: 5\' 2"  (1.575 m)  Weight: 127 lb  12.8 oz (57.97 kg)    GEN- The patient is elderly appearing, alert and oriented x 3 today.   Head- normocephalic, atraumatic Eyes-  Sclera clear, conjunctiva pink Ears- hearing intact Oropharynx- clear Neck- supple, Lungs- Clear to ausculation bilaterally, normal work of breathing Heart- Regular rate and rhythm, no murmurs, rubs or gallops, PMI not laterally displaced GI- soft, NT, ND, + BS Extremities- no clubbing, cyanosis, or edema MS- age appropriate muscle atrophy Skin- no rash or lesion Psych- euthymic mood, full affect Neuro- strength and sensation are intact  EKG today reveals sinus rhythm 63 bpm, PR 228 msec, incomplete RBBB, prominent U wave, nonspecific ST/ T changes Echo reviewed Dr Alford Highland notes are also reviewed  Assessment and Plan:

## 2012-03-09 NOTE — Assessment & Plan Note (Signed)
euvolemic at this time No changes

## 2012-03-11 DIAGNOSIS — Z87898 Personal history of other specified conditions: Secondary | ICD-10-CM | POA: Diagnosis not present

## 2012-03-16 ENCOUNTER — Telehealth: Payer: Self-pay | Admitting: Cardiology

## 2012-03-16 NOTE — Telephone Encounter (Signed)
Advised patient Dr Jenel Lucks note  indicates to continue amiodarone 200mg  daily for now, unless she develops symptoms of bradycardia.

## 2012-03-16 NOTE — Telephone Encounter (Signed)
New Problem:    Patient called in wanting to know if her dose of amiodarone (PACERONE) 200 MG tablet was halved per Dr. Johney Frame.  Please call back.

## 2012-03-24 DIAGNOSIS — D45 Polycythemia vera: Secondary | ICD-10-CM | POA: Diagnosis not present

## 2012-03-27 ENCOUNTER — Telehealth: Payer: Self-pay | Admitting: Cardiology

## 2012-03-27 DIAGNOSIS — M109 Gout, unspecified: Secondary | ICD-10-CM | POA: Diagnosis not present

## 2012-03-27 DIAGNOSIS — I4891 Unspecified atrial fibrillation: Secondary | ICD-10-CM

## 2012-03-27 DIAGNOSIS — I10 Essential (primary) hypertension: Secondary | ICD-10-CM

## 2012-03-27 MED ORDER — COLCHICINE 0.6 MG PO TABS: ORAL_TABLET | ORAL | Status: DC

## 2012-03-27 MED ORDER — HYDRALAZINE HCL 50 MG PO TABS: ORAL_TABLET | ORAL | Status: DC

## 2012-03-27 NOTE — Telephone Encounter (Signed)
0.6 bid x 1 day then 0.6 daily until pain resolves

## 2012-03-27 NOTE — Telephone Encounter (Signed)
Pt advised,verbalized understanding. 

## 2012-03-27 NOTE — Telephone Encounter (Signed)
Spoke with pt. Pt states she was diagnosed with gout by her PCP today. Her PCP prescribed indocin.  Pt's pharmacist asked pt to check with Dr Shirlee Latch to be sure OK to take with Eliquis. I will forward to Dr Shirlee Latch for review.

## 2012-03-27 NOTE — Telephone Encounter (Signed)
New Problem    Pt would like to ask nurse some questions about medication, new diagnosis and medication interactions. Would like to speak to nurse.

## 2012-03-27 NOTE — Telephone Encounter (Signed)
She was just diagnosed with gout today. How would you prescribe colchicine to be taken during a flare?

## 2012-03-27 NOTE — Telephone Encounter (Signed)
I'm not sure it would be a good idea to take Indocin with her problems with renal dysfunction.  Would suggest colchicine instead.  We can give her prescription for 0.6 daily to be used during flare.

## 2012-03-27 NOTE — Telephone Encounter (Signed)
Will sent in to pharmacy

## 2012-03-27 NOTE — Telephone Encounter (Signed)
Spoke with pt

## 2012-03-27 NOTE — Telephone Encounter (Signed)
Will forward to Nurse Katina Dung, RN.

## 2012-03-28 DIAGNOSIS — M109 Gout, unspecified: Secondary | ICD-10-CM | POA: Diagnosis not present

## 2012-03-28 DIAGNOSIS — H35059 Retinal neovascularization, unspecified, unspecified eye: Secondary | ICD-10-CM | POA: Diagnosis not present

## 2012-03-28 DIAGNOSIS — H353290 Exudative age-related macular degeneration, unspecified eye, stage unspecified: Secondary | ICD-10-CM | POA: Diagnosis not present

## 2012-03-29 ENCOUNTER — Telehealth: Payer: Self-pay | Admitting: Cardiology

## 2012-03-29 NOTE — Telephone Encounter (Signed)
New Problem    Pt is experience what she feels are side effects (diahrea) from her medication.

## 2012-03-29 NOTE — Telephone Encounter (Signed)
Spoke with pt. Pt started  colchicine for gout yesterday. She took 2 colchicine 0.6mg  tablets yesterday. Starting early this morning she has had diarrhea about 4 times. She did not take colchicine today. She did start imodium for diarrhea today.  She states her gout is improving but not completely resolved. I will forward to Dr Shirlee Latch for review.

## 2012-03-29 NOTE — Telephone Encounter (Signed)
Reviewed with Dr Shirlee Latch. OK to use imodium for diarrhea. Try taking colchicine daily until gout resolves. Pt advised. She will call back if no improvement in symptoms.

## 2012-04-20 DIAGNOSIS — R51 Headache: Secondary | ICD-10-CM | POA: Diagnosis not present

## 2012-05-02 DIAGNOSIS — H35059 Retinal neovascularization, unspecified, unspecified eye: Secondary | ICD-10-CM | POA: Diagnosis not present

## 2012-05-02 DIAGNOSIS — H353290 Exudative age-related macular degeneration, unspecified eye, stage unspecified: Secondary | ICD-10-CM | POA: Diagnosis not present

## 2012-05-03 DIAGNOSIS — H353290 Exudative age-related macular degeneration, unspecified eye, stage unspecified: Secondary | ICD-10-CM | POA: Diagnosis not present

## 2012-05-03 DIAGNOSIS — H35059 Retinal neovascularization, unspecified, unspecified eye: Secondary | ICD-10-CM | POA: Diagnosis not present

## 2012-05-09 DIAGNOSIS — Z87898 Personal history of other specified conditions: Secondary | ICD-10-CM | POA: Diagnosis not present

## 2012-05-16 ENCOUNTER — Ambulatory Visit: Payer: Medicare Other | Admitting: Cardiology

## 2012-05-18 ENCOUNTER — Ambulatory Visit (INDEPENDENT_AMBULATORY_CARE_PROVIDER_SITE_OTHER): Payer: Medicare Other | Admitting: Cardiology

## 2012-05-18 ENCOUNTER — Encounter: Payer: Self-pay | Admitting: Cardiology

## 2012-05-18 VITALS — BP 122/72 | HR 53 | Ht 62.0 in | Wt 128.0 lb

## 2012-05-18 DIAGNOSIS — I4891 Unspecified atrial fibrillation: Secondary | ICD-10-CM | POA: Diagnosis not present

## 2012-05-18 DIAGNOSIS — I2510 Atherosclerotic heart disease of native coronary artery without angina pectoris: Secondary | ICD-10-CM | POA: Diagnosis not present

## 2012-05-18 DIAGNOSIS — R0602 Shortness of breath: Secondary | ICD-10-CM | POA: Diagnosis not present

## 2012-05-18 DIAGNOSIS — I509 Heart failure, unspecified: Secondary | ICD-10-CM

## 2012-05-18 DIAGNOSIS — I10 Essential (primary) hypertension: Secondary | ICD-10-CM | POA: Diagnosis not present

## 2012-05-18 DIAGNOSIS — I5032 Chronic diastolic (congestive) heart failure: Secondary | ICD-10-CM

## 2012-05-18 LAB — HEPATIC FUNCTION PANEL
ALT: 21 U/L (ref 0–35)
Albumin: 4.4 g/dL (ref 3.5–5.2)
Alkaline Phosphatase: 54 U/L (ref 39–117)
Bilirubin, Direct: 0.3 mg/dL (ref 0.0–0.3)
Total Protein: 7 g/dL (ref 6.0–8.3)

## 2012-05-18 LAB — BASIC METABOLIC PANEL
BUN: 25 mg/dL — ABNORMAL HIGH (ref 6–23)
Calcium: 10.3 mg/dL (ref 8.4–10.5)
GFR: 64.07 mL/min (ref >60.00)
Glucose, Bld: 77 mg/dL (ref 70–99)
Potassium: 4.1 mEq/L (ref 3.5–5.1)

## 2012-05-18 LAB — BRAIN NATRIURETIC PEPTIDE: Pro B Natriuretic peptide (BNP): 293 pg/mL — ABNORMAL HIGH (ref 0.0–100.0)

## 2012-05-18 MED ORDER — AMIODARONE HCL 200 MG PO TABS: ORAL_TABLET | ORAL | Status: DC

## 2012-05-18 NOTE — Patient Instructions (Addendum)
Decrease amiodarone to 100mg  daily. This will be 1/2 of your 200mg  tablet daily.  Your physician recommends that you have  lab work today--liver profile/TSH/BMET/BNP.  Your physician wants you to follow-up in: 4 months with Dr Shirlee Latch. (August  2014).You will receive a reminder letter in the mail two months in advance. If you don't receive a letter, please call our office to schedule the follow-up appointment.

## 2012-05-19 ENCOUNTER — Telehealth: Payer: Self-pay | Admitting: *Deleted

## 2012-05-19 NOTE — Telephone Encounter (Signed)
Advised patient of lab results  

## 2012-05-19 NOTE — Telephone Encounter (Signed)
Message copied by Burnell Blanks on Fri May 19, 2012 10:13 AM ------      Message from: [facility], [place]      Created: Fri May 19, 2012  9:40 AM       BNP lower, normal creatinine ------

## 2012-05-21 NOTE — Progress Notes (Signed)
Patient ID: Sarah Mays, female   DOB: 30-Oct-1930, 79 y.o.   MRN: [id number] PCP: Dr. Gwenevere Abbot in [place]  79 yo with paroxysmal atrial fibrillation and chronic diastolic CHF returns for cardiology evaluation.  She remains in NSR today.  Weight is stable.  BP is within normal range.  She has been feeling good in general.  She gets mildly short of breath walking up steps.  She does not get short of breath walking on flat ground.  No chest pain.  No orthopnea or PND.  Leg edema remains minimal.  ECG: NSR, PACs, iRBBB, nonspecific T wave flattening.   Labs (7/13): K 4.7, creatinine 0.56 Labs (8/13): K 5.4, creatinine 0.69 Labs (11/13): TSH 6.45, K 5.4=>3.8, creatinine 1.5=>1.2, BUN 70=>35, HCT 33.2, BNP 866, AST 25, ALT 36 Labs (12/13): K 3.5 => 2.8, creatinine 1.2 => 1.33, BUN 56 Labs (1/14): LDL 85, HDL 86, K 3.9, creatinine 1.0, BNP 447  PMH: 1. Atrial fibrillation: Diagnosed initially in 10/13.  Holter monitor in 11/13 showed atrial fibrillation with average rate 65.  Back in NSR on amiodarone.   2. Chronic diastolic CHF: Echo (10/13) with EF 65-70%, mild MR, moderate biatrial enlargement, moderate TR, PA systolic pressure 45 mmHg.   3. ETT-Sestamibi (11/13): Exercised to stage II, small partially reversible anteroapical perfusion defect suggesting mild ischemia versus attenuation, EF 80%.  4. HTN: Lower extremity swelling with amlodipine.  5. Polycythemia vera: Has had phlebotomy only once.  6. Hypothyroidism 7. Macular degeneration 8. TAH 1980 9. Familial hypocalciuric hypercalcemia 10. PFTs (amiodarone use) in 1/14 showed a mild obstructive defect.   SH: Prior smoker (years ago).  Married, lives in [place].  Occasional ETOH.  Daughter works for [employer].   FH: CAD  ROS: All systems reviewed and negative except as per HPI.   Current Outpatient Prescriptions  Medication Sig Dispense Refill  . amiodarone (PACERONE) 200 MG tablet 1/2 tablet (total 100mg ) daily  45  tablet  3  . apixaban (ELIQUIS) 2.5 MG TABS tablet Take 1 tablet (2.5 mg total) by mouth 2 (two) times daily.  60 tablet  6  . Cholecalciferol (VITAMIN D3) 400 UNITS CAPS Take 1 capsule by mouth daily.      . furosemide (LASIX) 40 MG tablet Take 1 tablet (40 mg total) by mouth 2 (two) times daily.  60 tablet  6  . glucosamine-chondroitin 500-400 MG tablet Take 1 tablet by mouth daily.      . hydrALAZINE (APRESOLINE) 50 MG tablet 1 and 1/2 tabs (total 75 mg) three  times per day  135 tablet  6  . hydroxyurea (HYDREA) 500 MG capsule Take by mouth. TAKE 1 CAPSULE ON Monday & Tuesday ALL OTHER DAYS TAKE 2 CAPSULES.   May take with food to minimize GI side effects.      [name] Kitchen levothyroxine (SYNTHROID, LEVOTHROID) 75 MCG tablet Take 75 mcg by mouth daily.      . Lutein 10 MG TABS Take 1 tablet by mouth daily.      . magnesium oxide (MAG-OX) 400 MG tablet Take 400 mg by mouth daily.      . nebivolol (BYSTOLIC) 2.5 MG tablet Take 1 tablet (2.5 mg total) by mouth daily.  90 tablet  3  . potassium chloride SA (K-DUR,KLOR-CON) 20 MEQ tablet Take 1 tablet (20 mEq total) by mouth daily.  30 tablet  6   No current facility-administered medications for this visit.    BP 122/72  Pulse 53  Ht 5'  2" (1.575 m)  Wt 128 lb (58.06 kg)  BMI 23.41 kg/m2 General: NAD Neck: No JVD, no thyromegaly or thyroid nodule.  Lungs: Slight crackles at bases bilaterally.  CV: Nondisplaced PMI.  Heart irregular S1/S2, no S3/S4, 2/6 SEM.  No edema.  No carotid bruit.  Abdomen: Soft, nontender, no hepatosplenomegaly, no distention.  Neurologic: Alert and oriented x 3.  Psych: Normal affect. Extremities: No clubbing or cyanosis.   Assessment/Plan:   1. Atrial fibrillation: First noted in 10/13.  Her increased exertional dyspnea seems to date from around that time.  I suspect that the onset of atrial fibrillation probably triggered a diastolic CHF exacerbation. CHADSVASC score is 68 (age, female gender, HTN, CHF).  She is  anticoagulated with Eliquis 2.5 mg bid.  I would agree with the reduced dose of Eliquis given her age, small size (< 60 kg), and CKD.  Given her moderately enlarged atria, I thought that she would need an antiarrhythmic for the best chance to maintain NSR after cardioversion.  As she lives considerably distant from [place], I thought that amiodarone would probably be the best choice.  She currently is holding NSR on amiodarone but is mildly bradycardic.  - Decrease amiodarone to 100 mg daily today.  She needs LFTs and TSH.  She will need yearly eye exams.  Baseline PFTs showed a mild obstructive defect.  - Continue low dose nebivolol. 2. CAD: Patient has not had any chest pain.  She has had exertional dyspnea that seems to date from around the likely onset of her atrial fibrillation.  She had an ETT-Sestamibi last year that showed a small partially reversible anteroapical perfusion defect.  This may be a small area of ischemia but soft tissue attenuation is certainly also possible.  This does not sound like a high risk study.  Her exertional dyspnea seems to have improved now that she is back in NSR.  Would continue to manage medically. 3. Chronic diastolic CHF: No JVD and leg edema resolved after stopping amlodipine.  - Continue current Lasix at 40 mg bid.  - Will get BMET today.  4. HTN: BP seens to be under good control.   Marca Ancona 05/21/2012

## 2012-05-22 DIAGNOSIS — D45 Polycythemia vera: Secondary | ICD-10-CM | POA: Diagnosis not present

## 2012-06-08 DIAGNOSIS — Z87898 Personal history of other specified conditions: Secondary | ICD-10-CM | POA: Diagnosis not present

## 2012-06-13 DIAGNOSIS — H35059 Retinal neovascularization, unspecified, unspecified eye: Secondary | ICD-10-CM | POA: Diagnosis not present

## 2012-06-13 DIAGNOSIS — H353290 Exudative age-related macular degeneration, unspecified eye, stage unspecified: Secondary | ICD-10-CM | POA: Diagnosis not present

## 2012-06-15 DIAGNOSIS — R51 Headache: Secondary | ICD-10-CM | POA: Diagnosis not present

## 2012-06-15 DIAGNOSIS — R220 Localized swelling, mass and lump, head: Secondary | ICD-10-CM | POA: Diagnosis not present

## 2012-06-15 DIAGNOSIS — R609 Edema, unspecified: Secondary | ICD-10-CM | POA: Diagnosis not present

## 2012-06-21 DIAGNOSIS — Z1231 Encounter for screening mammogram for malignant neoplasm of breast: Secondary | ICD-10-CM | POA: Diagnosis not present

## 2012-06-21 LAB — HM MAMMOGRAPHY

## 2012-06-22 DIAGNOSIS — D45 Polycythemia vera: Secondary | ICD-10-CM | POA: Diagnosis not present

## 2012-06-23 DIAGNOSIS — R220 Localized swelling, mass and lump, head: Secondary | ICD-10-CM | POA: Diagnosis not present

## 2012-06-23 DIAGNOSIS — R51 Headache: Secondary | ICD-10-CM | POA: Diagnosis not present

## 2012-06-23 DIAGNOSIS — R221 Localized swelling, mass and lump, neck: Secondary | ICD-10-CM | POA: Diagnosis not present

## 2012-07-09 DIAGNOSIS — Z87898 Personal history of other specified conditions: Secondary | ICD-10-CM | POA: Diagnosis not present

## 2012-07-18 DIAGNOSIS — H353290 Exudative age-related macular degeneration, unspecified eye, stage unspecified: Secondary | ICD-10-CM | POA: Diagnosis not present

## 2012-07-18 DIAGNOSIS — H35059 Retinal neovascularization, unspecified, unspecified eye: Secondary | ICD-10-CM | POA: Diagnosis not present

## 2012-07-26 ENCOUNTER — Telehealth: Payer: Self-pay | Admitting: Cardiology

## 2012-07-26 DIAGNOSIS — D45 Polycythemia vera: Secondary | ICD-10-CM | POA: Diagnosis not present

## 2012-07-26 NOTE — Telephone Encounter (Signed)
New Problem  Pt said there her pharmacy gave her information about a test to check to see if her Everlene Balls is metabolizing correctly.  She wants to know if this type of test is good to take.

## 2012-07-26 NOTE — Telephone Encounter (Signed)
Spoke with patient. Pt states pharmacist gave them information about getting Harmony X swab test that only [facility] is doing to check if her body is metabolizing eliquis correctly. She is asking if she should have this test done. I will forward to Dr Shirlee Latch for review.

## 2012-07-27 NOTE — Telephone Encounter (Signed)
I am not familiar with this.  Will have to look into it.  Do not think it is necessary.

## 2012-07-31 NOTE — Telephone Encounter (Signed)
Spoke with pt.  It is a [employer] antiplatelet test.  Explained to patient that this would not tell her anything about Eliquis because it is two different types of medications and there was no need for the test.

## 2012-07-31 NOTE — Telephone Encounter (Signed)
Will forward to [employer]

## 2012-08-08 DIAGNOSIS — Z87898 Personal history of other specified conditions: Secondary | ICD-10-CM | POA: Diagnosis not present

## 2012-08-22 DIAGNOSIS — D45 Polycythemia vera: Secondary | ICD-10-CM | POA: Diagnosis not present

## 2012-08-23 DIAGNOSIS — Z01419 Encounter for gynecological examination (general) (routine) without abnormal findings: Secondary | ICD-10-CM | POA: Diagnosis not present

## 2012-08-24 ENCOUNTER — Other Ambulatory Visit: Payer: Self-pay | Admitting: Cardiology

## 2012-08-29 DIAGNOSIS — H35059 Retinal neovascularization, unspecified, unspecified eye: Secondary | ICD-10-CM | POA: Diagnosis not present

## 2012-08-29 DIAGNOSIS — H353290 Exudative age-related macular degeneration, unspecified eye, stage unspecified: Secondary | ICD-10-CM | POA: Diagnosis not present

## 2012-08-31 ENCOUNTER — Other Ambulatory Visit: Payer: Self-pay | Admitting: Cardiology

## 2012-09-08 DIAGNOSIS — Z87898 Personal history of other specified conditions: Secondary | ICD-10-CM | POA: Diagnosis not present

## 2012-09-12 ENCOUNTER — Other Ambulatory Visit: Payer: Self-pay | Admitting: Cardiology

## 2012-09-21 DIAGNOSIS — D45 Polycythemia vera: Secondary | ICD-10-CM | POA: Diagnosis not present

## 2012-09-26 ENCOUNTER — Encounter: Payer: Self-pay | Admitting: Cardiology

## 2012-09-26 ENCOUNTER — Ambulatory Visit (INDEPENDENT_AMBULATORY_CARE_PROVIDER_SITE_OTHER): Payer: Medicare Other | Admitting: Cardiology

## 2012-09-26 VITALS — BP 126/66 | HR 50 | Ht 62.0 in | Wt 121.0 lb

## 2012-09-26 DIAGNOSIS — R001 Bradycardia, unspecified: Secondary | ICD-10-CM

## 2012-09-26 DIAGNOSIS — I5032 Chronic diastolic (congestive) heart failure: Secondary | ICD-10-CM | POA: Diagnosis not present

## 2012-09-26 DIAGNOSIS — I2510 Atherosclerotic heart disease of native coronary artery without angina pectoris: Secondary | ICD-10-CM

## 2012-09-26 DIAGNOSIS — Z79899 Other long term (current) drug therapy: Secondary | ICD-10-CM | POA: Diagnosis not present

## 2012-09-26 DIAGNOSIS — I509 Heart failure, unspecified: Secondary | ICD-10-CM | POA: Diagnosis not present

## 2012-09-26 DIAGNOSIS — I498 Other specified cardiac arrhythmias: Secondary | ICD-10-CM

## 2012-09-26 DIAGNOSIS — Z5181 Encounter for therapeutic drug level monitoring: Secondary | ICD-10-CM | POA: Diagnosis not present

## 2012-09-26 DIAGNOSIS — I4891 Unspecified atrial fibrillation: Secondary | ICD-10-CM

## 2012-09-26 NOTE — Progress Notes (Signed)
Patient ID: Sarah Mays, female   DOB: November 24, 1930, 79 y.o.   MRN: [id number] PCP: Dr. Gwenevere Abbot in [place]  79 yo with paroxysmal atrial fibrillation and chronic diastolic CHF returns for cardiology evaluation.  She remains in NSR today with no tachypalpitations.  Weight is stable.  BP is within normal range.  She has been feeling good in general.  She gets mildly short of breath walking up steps.  She does not get short of breath walking on flat ground.  No chest pain.  No orthopnea or PND.  Leg edema remains minimal.  No bleeding sequelae on apixaban.  Weight is down 7 lbs from prior appointment.   ECG: NSR, 1st degree AVB, iRBBB, nonspecific lateral T wave flattening  Labs (7/13): K 4.7, creatinine 0.56 Labs (8/13): K 5.4, creatinine 0.69 Labs (11/13): TSH 6.45, K 5.4=>3.8, creatinine 1.5=>1.2, BUN 70=>35, HCT 33.2, BNP 866, AST 25, ALT 36 Labs (12/13): K 3.5 => 2.8, creatinine 1.2 => 1.33, BUN 56 Labs (1/14): LDL 85, HDL 86, K 3.9, creatinine 1.0, BNP 447 Labs (4/14): K 4.1, creatinine 0.9, BNP 293, LFTs normal, TSH normal  PMH: 1. Atrial fibrillation: Diagnosed initially in 10/13.  Holter monitor in 11/13 showed atrial fibrillation with average rate 65.  Back in NSR on amiodarone.   2. Chronic diastolic CHF: Echo (10/13) with EF 65-70%, mild MR, moderate biatrial enlargement, moderate TR, PA systolic pressure 45 mmHg.   3. ETT-Sestamibi (11/13): Exercised to stage II, small partially reversible anteroapical perfusion defect suggesting mild ischemia versus attenuation, EF 80%.  4. HTN: Lower extremity swelling with amlodipine.  5. Polycythemia vera: Has had phlebotomy only once.  6. Hypothyroidism 7. Macular degeneration 8. TAH 1980 9. Familial hypocalciuric hypercalcemia 10. PFTs (amiodarone use) in 1/14 showed a mild obstructive defect.   SH: Prior smoker (years ago).  Married, lives in [place].  Occasional ETOH.  Daughter works for [employer].   FH: CAD  Current  Outpatient Prescriptions  Medication Sig Dispense Refill  . amiodarone (PACERONE) 200 MG tablet 1/2 tablet (total 100mg ) daily  45 tablet  3  . Cholecalciferol (VITAMIN D3) 400 UNITS CAPS Take 1 capsule by mouth daily.      [name] Kitchen ELIQUIS 2.5 MG TABS tablet take 1 tablet by mouth twice a day  60 tablet  6  . furosemide (LASIX) 40 MG tablet take 1 tablet by mouth twice a day  60 tablet  6  . glucosamine-chondroitin 500-400 MG tablet Take 1 tablet by mouth daily.      . hydrALAZINE (APRESOLINE) 50 MG tablet 1 and 1/2 tabs (total 75 mg) three  times per day  135 tablet  6  . hydroxyurea (HYDREA) 500 MG capsule Take by mouth. TAKE 1 CAPSULE ON Monday & Tuesday ALL OTHER DAYS TAKE 2 CAPSULES.   May take with food to minimize GI side effects.      [name] Kitchen KLOR-CON M20 20 MEQ tablet TAKE 1 TABLET BY MOUTH DAILY  30 tablet  6  . levothyroxine (SYNTHROID, LEVOTHROID) 75 MCG tablet Take 75 mcg by mouth daily.      . Lutein 10 MG TABS Take 1 tablet by mouth daily.      . magnesium oxide (MAG-OX) 400 MG tablet Take 400 mg by mouth daily.      . nebivolol (BYSTOLIC) 2.5 MG tablet Take 1 tablet (2.5 mg total) by mouth daily.  90 tablet  3   No current facility-administered medications for this visit.    BP  126/66  Pulse 50  Ht 5\' 2"  (1.575 m)  Wt 54.885 kg (121 lb)  BMI 22.13 kg/m2 General: NAD Neck: No JVD, no thyromegaly or thyroid nodule.  Lungs: Slight crackles at bases bilaterally.  CV: Nondisplaced PMI.  Heart irregular S1/S2, no S3/S4, 2/6 early SEM.  No edema.  No carotid bruit.  Abdomen: Soft, nontender, no hepatosplenomegaly, no distention.  Neurologic: Alert and oriented x 3.  Psych: Normal affect. Extremities: No clubbing or cyanosis.   Assessment/Plan:   1. Atrial fibrillation: First noted in 10/13.  Her increased exertional dyspnea seems to date from around that time.  I suspect that the onset of atrial fibrillation probably triggered a diastolic CHF exacerbation. CHADSVASC score is 49 (age,  female gender, HTN, CHF).  She is anticoagulated with Eliquis 2.5 mg bid.  I would agree with the reduced dose of Eliquis given her age (50) and small size (< 60 kg).  Given her moderately enlarged atria, I thought that she would need an antiarrhythmic for the best chance to maintain NSR after cardioversion.  As she lives considerably distant from [place], I thought that amiodarone would probably be the best choice.  She currently is holding NSR on amiodarone. - She needs LFTs and TSH checked today.  She will need yearly eye exams.  Baseline PFTs showed a mild obstructive defect.  - Continue low dose nebivolol. 2. CAD: Patient has not had any chest pain.  She has had exertional dyspnea that seems to date from around the likely onset of her atrial fibrillation.  She had an ETT-Sestamibi last year that showed a small partially reversible anteroapical perfusion defect.  This may be a small area of ischemia but soft tissue attenuation is certainly also possible.  This does not sound like a high risk study.  Her exertional dyspnea seems to have improved now that she is back in NSR.  Would continue to manage medically. 3. Chronic diastolic CHF: No JVD and leg edema resolved after stopping amlodipine.  - Continue current Lasix at 40 mg bid.  - Will get BMET today.  4. HTN: BP seens to be under good control.   Marca Ancona 09/26/2012

## 2012-09-26 NOTE — Patient Instructions (Addendum)
Your physician recommends that you continue on your current medications as directed. Please refer to the Current Medication list given to you today.  Your physician recommends that you return for lab work in: today  Your physician wants you to follow-up in: 4 months. You will receive a reminder letter in the mail two months in advance. If you don't receive a letter, please call our office to schedule the follow-up appointment.   

## 2012-09-27 LAB — CBC WITH DIFFERENTIAL/PLATELET
Basophils Relative: 0.3 % (ref 0.0–3.0)
Eosinophils Absolute: 0 10*3/uL (ref 0.0–0.7)
Eosinophils Relative: 0.3 % (ref 0.0–5.0)
Hemoglobin: 11.8 g/dL — ABNORMAL LOW (ref 12.0–15.0)
Lymphocytes Relative: 29 % (ref 12.0–46.0)
MCHC: 34.1 g/dL (ref 30.0–36.0)
Neutro Abs: 2.1 10*3/uL (ref 1.4–7.7)
RBC: 2.8 Mil/uL — ABNORMAL LOW (ref 3.87–5.11)

## 2012-09-27 LAB — BASIC METABOLIC PANEL
CO2: 31 mEq/L (ref 19–32)
Calcium: 10.4 mg/dL (ref 8.4–10.5)
Chloride: 99 mEq/L (ref 96–112)
Sodium: 136 mEq/L (ref 135–145)

## 2012-09-27 LAB — HEPATIC FUNCTION PANEL
ALT: 17 U/L (ref 0–35)
Total Bilirubin: 0.7 mg/dL (ref 0.3–1.2)

## 2012-09-27 LAB — TSH: TSH: 0.93 u[IU]/mL (ref 0.35–5.50)

## 2012-10-02 DIAGNOSIS — R220 Localized swelling, mass and lump, head: Secondary | ICD-10-CM | POA: Diagnosis not present

## 2012-10-02 DIAGNOSIS — R5381 Other malaise: Secondary | ICD-10-CM | POA: Diagnosis not present

## 2012-10-02 DIAGNOSIS — E039 Hypothyroidism, unspecified: Secondary | ICD-10-CM | POA: Diagnosis not present

## 2012-10-02 DIAGNOSIS — L659 Nonscarring hair loss, unspecified: Secondary | ICD-10-CM | POA: Diagnosis not present

## 2012-10-09 DIAGNOSIS — Z87898 Personal history of other specified conditions: Secondary | ICD-10-CM | POA: Diagnosis not present

## 2012-10-10 DIAGNOSIS — H35059 Retinal neovascularization, unspecified, unspecified eye: Secondary | ICD-10-CM | POA: Diagnosis not present

## 2012-10-10 DIAGNOSIS — H353290 Exudative age-related macular degeneration, unspecified eye, stage unspecified: Secondary | ICD-10-CM | POA: Diagnosis not present

## 2012-10-31 DIAGNOSIS — R5381 Other malaise: Secondary | ICD-10-CM | POA: Diagnosis not present

## 2012-10-31 DIAGNOSIS — I10 Essential (primary) hypertension: Secondary | ICD-10-CM | POA: Diagnosis not present

## 2012-10-31 DIAGNOSIS — R220 Localized swelling, mass and lump, head: Secondary | ICD-10-CM | POA: Diagnosis not present

## 2012-10-31 DIAGNOSIS — G4700 Insomnia, unspecified: Secondary | ICD-10-CM | POA: Diagnosis not present

## 2012-11-08 DIAGNOSIS — Z87898 Personal history of other specified conditions: Secondary | ICD-10-CM | POA: Diagnosis not present

## 2012-11-09 ENCOUNTER — Other Ambulatory Visit: Payer: Self-pay | Admitting: Cardiology

## 2012-11-20 DIAGNOSIS — D45 Polycythemia vera: Secondary | ICD-10-CM | POA: Diagnosis not present

## 2012-11-24 DIAGNOSIS — M810 Age-related osteoporosis without current pathological fracture: Secondary | ICD-10-CM | POA: Diagnosis not present

## 2012-11-28 DIAGNOSIS — H353290 Exudative age-related macular degeneration, unspecified eye, stage unspecified: Secondary | ICD-10-CM | POA: Diagnosis not present

## 2012-11-28 DIAGNOSIS — H35059 Retinal neovascularization, unspecified, unspecified eye: Secondary | ICD-10-CM | POA: Diagnosis not present

## 2012-11-29 DIAGNOSIS — Z79899 Other long term (current) drug therapy: Secondary | ICD-10-CM | POA: Diagnosis not present

## 2012-11-29 DIAGNOSIS — D45 Polycythemia vera: Secondary | ICD-10-CM | POA: Diagnosis not present

## 2012-11-29 DIAGNOSIS — R51 Headache: Secondary | ICD-10-CM | POA: Diagnosis not present

## 2012-11-29 DIAGNOSIS — I4891 Unspecified atrial fibrillation: Secondary | ICD-10-CM | POA: Diagnosis not present

## 2012-11-29 DIAGNOSIS — I10 Essential (primary) hypertension: Secondary | ICD-10-CM | POA: Diagnosis not present

## 2012-11-29 DIAGNOSIS — E039 Hypothyroidism, unspecified: Secondary | ICD-10-CM | POA: Diagnosis not present

## 2012-12-01 DIAGNOSIS — G4700 Insomnia, unspecified: Secondary | ICD-10-CM | POA: Diagnosis not present

## 2012-12-01 DIAGNOSIS — M810 Age-related osteoporosis without current pathological fracture: Secondary | ICD-10-CM | POA: Diagnosis not present

## 2012-12-01 DIAGNOSIS — R51 Headache: Secondary | ICD-10-CM | POA: Diagnosis not present

## 2012-12-05 DIAGNOSIS — Z23 Encounter for immunization: Secondary | ICD-10-CM | POA: Diagnosis not present

## 2012-12-09 DIAGNOSIS — Z87898 Personal history of other specified conditions: Secondary | ICD-10-CM | POA: Diagnosis not present

## 2013-01-08 DIAGNOSIS — Z87898 Personal history of other specified conditions: Secondary | ICD-10-CM | POA: Diagnosis not present

## 2013-01-09 DIAGNOSIS — H353290 Exudative age-related macular degeneration, unspecified eye, stage unspecified: Secondary | ICD-10-CM | POA: Diagnosis not present

## 2013-01-09 DIAGNOSIS — H35059 Retinal neovascularization, unspecified, unspecified eye: Secondary | ICD-10-CM | POA: Diagnosis not present

## 2013-01-16 DIAGNOSIS — D45 Polycythemia vera: Secondary | ICD-10-CM | POA: Diagnosis not present

## 2013-01-25 ENCOUNTER — Ambulatory Visit (INDEPENDENT_AMBULATORY_CARE_PROVIDER_SITE_OTHER): Payer: Medicare Other | Admitting: Cardiology

## 2013-01-25 ENCOUNTER — Encounter: Payer: Self-pay | Admitting: Cardiology

## 2013-01-25 VITALS — BP 149/64 | HR 51 | Ht 62.0 in | Wt 124.0 lb

## 2013-01-25 DIAGNOSIS — I10 Essential (primary) hypertension: Secondary | ICD-10-CM | POA: Diagnosis not present

## 2013-01-25 DIAGNOSIS — I509 Heart failure, unspecified: Secondary | ICD-10-CM

## 2013-01-25 DIAGNOSIS — I2510 Atherosclerotic heart disease of native coronary artery without angina pectoris: Secondary | ICD-10-CM

## 2013-01-25 DIAGNOSIS — I5032 Chronic diastolic (congestive) heart failure: Secondary | ICD-10-CM

## 2013-01-25 DIAGNOSIS — I4891 Unspecified atrial fibrillation: Secondary | ICD-10-CM

## 2013-01-25 LAB — CBC WITH DIFFERENTIAL/PLATELET
Basophils Absolute: 0 10*3/uL (ref 0.0–0.1)
Eosinophils Absolute: 0 10*3/uL (ref 0.0–0.7)
Lymphocytes Relative: 26.7 % (ref 12.0–46.0)
MCHC: 34.1 g/dL (ref 30.0–36.0)
Monocytes Absolute: 0.5 10*3/uL (ref 0.1–1.0)
Monocytes Relative: 11.8 % (ref 3.0–12.0)
Neutro Abs: 2.7 10*3/uL (ref 1.4–7.7)
Neutrophils Relative %: 60.7 % (ref 43.0–77.0)
Platelets: 211 10*3/uL (ref 150.0–400.0)
RDW: 15.9 % — ABNORMAL HIGH (ref 11.5–14.6)

## 2013-01-25 LAB — HEPATIC FUNCTION PANEL
Albumin: 4.7 g/dL (ref 3.5–5.2)
Bilirubin, Direct: 0.2 mg/dL (ref 0.0–0.3)
Total Bilirubin: 0.8 mg/dL (ref 0.3–1.2)
Total Protein: 6.6 g/dL (ref 6.0–8.3)

## 2013-01-25 LAB — TSH: TSH: 0.9 u[IU]/mL (ref 0.35–5.50)

## 2013-01-25 NOTE — Patient Instructions (Signed)
Your physician recommends that you return for lab work today--TSH/Liver profile/CBCd  Your physician wants you to follow-up in: 6 months with Dr Shirlee Latch. (June 2015).  You will receive a reminder letter in the mail two months in advance. If you don't receive a letter, please call our office to schedule the follow-up appointment.

## 2013-01-26 ENCOUNTER — Telehealth: Payer: Self-pay | Admitting: Cardiology

## 2013-01-26 DIAGNOSIS — I4891 Unspecified atrial fibrillation: Secondary | ICD-10-CM

## 2013-01-26 DIAGNOSIS — Z7901 Long term (current) use of anticoagulants: Secondary | ICD-10-CM

## 2013-01-26 NOTE — Telephone Encounter (Signed)
Left pt a message to call back. 

## 2013-01-26 NOTE — Telephone Encounter (Signed)
Follow Up  Pt was advised to call in with her Prescriptions instructions.  Hydroxyurea-- Take 2 capsules Oral  Mon. Wed. Fri. And 1 Capsule Every other day of the week.  Synthroid--- the mcg's were different.. Her RX says 112 Ibandronate Sodium tab-- 150 Mg taken once a month (This medication is new and wasn't listed)  Yes she does take magnesium.  Please call back to discuss.

## 2013-01-26 NOTE — Progress Notes (Signed)
Patient ID: Sarah Mays, female   DOB: [birth year], 79 y.o.   MRN: [id number] PCP: Dr. Gwenevere Abbot in [place]  79 yo with paroxysmal atrial fibrillation and chronic diastolic CHF returns for cardiology evaluation.  She remains in NSR today with no tachypalpitations.  Weight is stable.  She has been feeling good in general.  She gets mildly short of breath walking up steps.  She does not get short of breath walking on flat ground and goes to the gym 3 times a week.  No chest pain.  No orthopnea or PND.  Leg edema remains minimal.  No bleeding sequelae on apixaban.  She and her husband are planning to move from [facility] to [place].   ECG: NSR, bigeminal PACs, anterolateral Qs  Labs (7/13): K 4.7, creatinine 0.56 Labs (8/13): K 5.4, creatinine 0.69 Labs (11/13): TSH 6.45, K 5.4=>3.8, creatinine 1.5=>1.2, BUN 70=>35, HCT 33.2, BNP 866, AST 25, ALT 36 Labs (12/13): K 3.5 => 2.8, creatinine 1.2 => 1.33, BUN 56 Labs (1/14): LDL 85, HDL 86, K 3.9, creatinine 1.0, BNP 447 Labs (4/14): K 4.1, creatinine 0.9, BNP 293, LFTs normal, TSH normal Labs (8/14): K 4.3, creatinine 1.2, LFTs normal, TSH normal  PMH: 1. Atrial fibrillation: Diagnosed initially in 10/13.  Holter monitor in 11/13 showed atrial fibrillation with average rate 65.  Back in NSR on amiodarone.   2. Chronic diastolic CHF: Echo (10/13) with EF 65-70%, mild MR, moderate biatrial enlargement, moderate TR, PA systolic pressure 45 mmHg.   3. ETT-Sestamibi (11/13): Exercised to stage II, small partially reversible anteroapical perfusion defect suggesting mild ischemia versus attenuation, EF 80%.  4. HTN: Lower extremity swelling with amlodipine.  5. Polycythemia vera: Has had phlebotomy only once.  6. Hypothyroidism 7. Macular degeneration 8. TAH 1980 9. Familial hypocalciuric hypercalcemia 10. PFTs (amiodarone use) in 1/14 showed a mild obstructive defect.   SH: Prior smoker (years ago).  Married, lives in [place].   Occasional ETOH.  Daughter works for [employer].   FH: CAD  ROS: All systems reviewed and negative except as per HPI.   Current Outpatient Prescriptions  Medication Sig Dispense Refill  . amiodarone (PACERONE) 200 MG tablet 1/2 tablet (total 100mg ) daily  45 tablet  3  . Cholecalciferol (VITAMIN D3) 400 UNITS CAPS Take 1 capsule by mouth daily.      [name] Kitchen ELIQUIS 2.5 MG TABS tablet take 1 tablet by mouth twice a day  60 tablet  6  . furosemide (LASIX) 40 MG tablet take 1 tablet by mouth twice a day  60 tablet  6  . glucosamine-chondroitin 500-400 MG tablet Take 1 tablet by mouth daily.      . hydrALAZINE (APRESOLINE) 50 MG tablet TAKE ONE AND ONE-HALF(1 AND 1/2) TABLETS 3 TIMES DAILY.  135 tablet  4  . hydroxyurea (HYDREA) 500 MG capsule Take by mouth. TAKE 1 CAPSULE ON Monday & Tuesday ALL OTHER DAYS TAKE 2 CAPSULES.   May take with food to minimize GI side effects.      . ibandronate (BONIVA) 150 MG tablet As directed      . KLOR-CON M20 20 MEQ tablet TAKE 1 TABLET BY MOUTH DAILY  30 tablet  6  . levothyroxine (SYNTHROID, LEVOTHROID) 75 MCG tablet Take 75 mcg by mouth daily.      . Lutein 10 MG TABS Take 1 tablet by mouth daily.      . magnesium oxide (MAG-OX) 400 MG tablet Take 400 mg by mouth daily.      [name] Kitchen  nebivolol (BYSTOLIC) 2.5 MG tablet Take 1 tablet (2.5 mg total) by mouth daily.  90 tablet  3   No current facility-administered medications for this visit.    BP 149/64  Pulse 51  Ht 5\' 2"  (1.575 m)  Wt 56.246 kg (124 lb)  BMI 22.67 kg/m2 General: NAD Neck: No JVD, no thyromegaly or thyroid nodule.  Lungs: Slight crackles at bases bilaterally.  CV: Nondisplaced PMI.  Heart irregular S1/S2, no S3/S4, 1/6 early SEM.  No edema.  No carotid bruit.  Abdomen: Soft, nontender, no hepatosplenomegaly, no distention.  Neurologic: Alert and oriented x 3.  Psych: Normal affect. Extremities: No clubbing or cyanosis.   Assessment/Plan:   1. Atrial fibrillation: First noted in 10/13.   Her increased exertional dyspnea seems to date from around that time.  I suspect that the onset of atrial fibrillation probably triggered a diastolic CHF exacerbation. CHADSVASC score is 71 (age, female gender, HTN, CHF).  She is anticoagulated with Eliquis 2.5 mg bid.  I would agree with the reduced dose of Eliquis given her age (45) and small size (< 60 kg).  Given her moderately enlarged atria, I thought that she would need an antiarrhythmic for the best chance to maintain NSR after cardioversion.  As she lives considerably distant from [place], I thought that amiodarone would probably be the best choice.  She currently is holding NSR on amiodarone. - She needs LFTs and TSH checked today.  She will need yearly eye exams.  Baseline PFTs showed a mild obstructive defect.  - Check CBC given Eliquis use.  - Continue low dose nebivolol. 2. CAD: Patient has not had any chest pain.  She has had exertional dyspnea that seems to date from around the likely onset of her atrial fibrillation.  She had an ETT-Sestamibi in 11/13 that showed a small partially reversible anteroapical perfusion defect.  This may be a small area of ischemia but soft tissue attenuation is certainly also possible.  This does not sound like a high risk study.  Her exertional dyspnea is much improved now that she is back in NSR.  No chest pain.  Would continue to manage medically.  3. Chronic diastolic CHF: No JVD and leg edema resolved after stopping amlodipine.  - Continue current Lasix at 40 mg bid.  4. HTN: BP is acceptable.   Marca Ancona 01/26/2013

## 2013-01-29 NOTE — Telephone Encounter (Signed)
I spoke with pt this morning about her lab results.    States her insurance is denying coverage for Eliquis beginning January 1. She has enough of her Eliquis to last through that date. She is faxing a form from her insurance company ( Attn:Dr. Shirlee Latch) for her Eliquis.  Mylo Red RN

## 2013-01-29 NOTE — Telephone Encounter (Signed)
Received faxed from [facility] on "Eliquis will not be covered on 2015 formulary, formulary alternative is xarelto". Will route to Dr Dr Shirlee Latch.

## 2013-01-29 NOTE — Telephone Encounter (Signed)
She can start Xarelto 15 mg daily to replace Eliquis.  Needs BMET and CBC in 1 month.

## 2013-01-30 MED ORDER — RIVAROXABAN 15 MG PO TABS: 15.0000 mg | ORAL_TABLET | Freq: Every day | ORAL | Status: DC

## 2013-01-30 NOTE — Telephone Encounter (Signed)
[name], can you please order these labs per Dr Kathlyn [place] note.

## 2013-01-30 NOTE — Telephone Encounter (Signed)
I spoke with pt this morning & she will start Xarelto January1 when her Eliquis runs out. She is moving back to [place]) from [place] in early February.  I have mailed her out an appt reminder to come in for lab work early February & placed lab orders  Prescription sent for Xarelto Mylo Red RN

## 2013-02-09 ENCOUNTER — Telehealth: Payer: Self-pay | Admitting: Cardiology

## 2013-02-09 DIAGNOSIS — I4891 Unspecified atrial fibrillation: Secondary | ICD-10-CM | POA: Diagnosis not present

## 2013-02-09 DIAGNOSIS — R221 Localized swelling, mass and lump, neck: Secondary | ICD-10-CM | POA: Diagnosis not present

## 2013-02-09 DIAGNOSIS — I10 Essential (primary) hypertension: Secondary | ICD-10-CM | POA: Diagnosis not present

## 2013-02-09 DIAGNOSIS — R220 Localized swelling, mass and lump, head: Secondary | ICD-10-CM | POA: Diagnosis not present

## 2013-02-09 DIAGNOSIS — G4700 Insomnia, unspecified: Secondary | ICD-10-CM | POA: Diagnosis not present

## 2013-02-09 NOTE — Telephone Encounter (Signed)
I will route to RN.

## 2013-02-09 NOTE — Telephone Encounter (Signed)
New problem   Pt called and asked for Dr Aundra Dubin RN will only speak to her,.   Pt stated will call back after I adv'd RN not here.

## 2013-02-13 NOTE — Telephone Encounter (Signed)
Follow Up  ° °Pt returned call//SR  °

## 2013-02-13 NOTE — Telephone Encounter (Signed)
LMTCB

## 2013-02-14 NOTE — Telephone Encounter (Signed)
Follow Up ° °Pt returned call//  °

## 2013-02-14 NOTE — Telephone Encounter (Signed)
Spoke with patient. Pt has not changed over to Xarelto from Eliquis. Pt requesting lab order for BMET/CBCd to be mailed to her to have done 1 month (per Dr Claris Gladden order) after changing over to Xarelto. Pt thinks she may still be in [place] at that time and will have lab done there if she is, if not she will call and schedule lab appt for here.

## 2013-02-20 DIAGNOSIS — Z9981 Dependence on supplemental oxygen: Secondary | ICD-10-CM | POA: Diagnosis not present

## 2013-02-20 DIAGNOSIS — D45 Polycythemia vera: Secondary | ICD-10-CM | POA: Diagnosis not present

## 2013-02-22 DIAGNOSIS — H353290 Exudative age-related macular degeneration, unspecified eye, stage unspecified: Secondary | ICD-10-CM | POA: Diagnosis not present

## 2013-02-22 DIAGNOSIS — H35059 Retinal neovascularization, unspecified, unspecified eye: Secondary | ICD-10-CM | POA: Diagnosis not present

## 2013-02-27 ENCOUNTER — Other Ambulatory Visit: Payer: Self-pay | Admitting: Cardiology

## 2013-03-14 ENCOUNTER — Non-Acute Institutional Stay: Payer: Medicare Other | Admitting: Geriatric Medicine

## 2013-03-14 ENCOUNTER — Encounter: Payer: Self-pay | Admitting: Geriatric Medicine

## 2013-03-14 VITALS — BP 102/58 | HR 56 | Temp 97.8°F | Ht 62.0 in | Wt 120.0 lb

## 2013-03-14 DIAGNOSIS — I10 Essential (primary) hypertension: Secondary | ICD-10-CM | POA: Diagnosis not present

## 2013-03-14 DIAGNOSIS — M810 Age-related osteoporosis without current pathological fracture: Secondary | ICD-10-CM

## 2013-03-14 DIAGNOSIS — R221 Localized swelling, mass and lump, neck: Secondary | ICD-10-CM

## 2013-03-14 DIAGNOSIS — D45 Polycythemia vera: Secondary | ICD-10-CM | POA: Diagnosis not present

## 2013-03-14 DIAGNOSIS — H3530 Unspecified macular degeneration: Secondary | ICD-10-CM

## 2013-03-14 DIAGNOSIS — R220 Localized swelling, mass and lump, head: Secondary | ICD-10-CM | POA: Diagnosis not present

## 2013-03-14 DIAGNOSIS — I4891 Unspecified atrial fibrillation: Secondary | ICD-10-CM

## 2013-03-14 NOTE — Progress Notes (Signed)
Patient ID: Sarah Mays, female   DOB: 04-Jul-1930, 80 y.o.   MRN: [id number]  [facility] [phone])  Code Status:  Contact Information   Name Relation Home Work [place] Spouse [phone]         Chief Complaint  Patient presents with  . Medical Managment of Chronic Issues    New Patient     HPI: This is a 80 y.o. female resident of [place],  Independent Living here today to establish care with [employer] and management of ongoing medical issues.  Patient reports herself to be in good health, though currently under a great deal of stress due to recent move and spouse's serious illness and hospitalization. She is currently staying in an assisted-living apartment until her independent living apartment is ready. Patient has been receiving most of her healthcare in [place], [place]. This includes L[facility] Arms,  Who follows patient's polycythemia vera. Patient reports having labs recently. Patient has a history of atrial fibrillation, is followed by Dr. Aundra Dubin of [facility]. She reports no symptoms and is anticoagulated with Eliquis. Patient has a right sided neck mass that has been worked up per her report. Workup included an MRI. There's been no specific diagnosis. Patient does note she has several other similar masses. The patient has had macular degeneration in the left eye for injection therapy.   No Known Allergies  MEDICATIONS -   Current Outpatient Prescriptions on File Prior to Visit  Medication Sig Dispense Refill  . BYSTOLIC 2.5 MG tablet take 1 tablet by mouth once daily  90 tablet  1  . Cholecalciferol (VITAMIN D3) 400 UNITS CAPS Take 1 capsule by mouth daily.      [name] Kitchen KLOR-CON M20 20 MEQ tablet TAKE 1 TABLET BY MOUTH DAILY  30 tablet  6  . magnesium oxide (MAG-OX) 400 MG tablet Take 400 mg by mouth daily.       No current facility-administered medications on file prior to visit.      DATA REVIEWED  Radiologic Exams:   Cardiovascular Exams:   Laboratory Studies Lab Results  Component Value Date   WBC 4.4* 01/25/2013   HGB 12.0 01/25/2013   HCT 35.3* 01/25/2013   MCV 117.9 Repeated and verified X2.* 01/25/2013   PLT 211.0 01/25/2013   Lab Results  Component Value Date   NA 136 09/26/2012   K 4.2 09/26/2012   CL 99 09/26/2012   CO2 31 09/26/2012   GLUCOSE 67* 09/26/2012   BUN 25* 09/26/2012   CREATININE 1.2 09/26/2012   CALCIUM 10.4 09/26/2012   ALBUMIN 4.7 01/25/2013   AST 18 01/25/2013   ALT 18 01/25/2013   ALKPHOS 52 01/25/2013   BILITOT 0.8 01/25/2013    Lab Results  Component Value Date   TSH 0.90 01/25/2013        Past Medical History  Diagnosis Date  . Atrial fibrillation   . Hypertension   . Mitral valve disorders   . Tricuspid valve disorders, specified as nonrheumatic   . Diastolic dysfunction   . Shortness of breath   . Peripheral edema   . Neck mass     right, w/u including MRI negative  . Hypothyroidism   . Macular degeneration     Left, s/p inj. tx  . Senile osteoporosis     Reclast in the past  . Alopecia, unspecified   . Pure hypercholesterolemia   . Congestive heart failure, unspecified   . Headache(784.0)   .  Backache, unspecified   . Polycythemia vera(238.4)   . Gouty arthropathy, unspecified   . Insomnia, unspecified   . Other malaise and fatigue   . Hyposmolality and/or hyponatremia   . Hyperpotassemia   . Edema   . Closed fracture of lower end of radius with ulna   . Hypercalcemia   . Deafness     Left, s/p multiple surgeries   Past Surgical History  Procedure Laterality Date  . Breast biopsy  06/18/1998    left  . Total abdominal hysterectomy    . Tonsillectomy    . Breast biopsy     Family Status  Relation Status Death Age  . Father Deceased     heart disease  . Daughter Alive     breast cancer  . Mother Deceased     ovarian cancer  . Son Alive   . Sister Alive   . Brother Alive   .  Sister Deceased 46's  . Sister Deceased 34's  . Brother Deceased 24's   History   Social History Narrative   Patient is Married. College graduate [name]. Of [place]). Lives in apartment,  Independent Living  section at [place] since 02/2013.   No Smoking history, Mod. alcohol use.   Patient has  no Advanced planning documents          REVIEW OF SYSTEMS  DATA OBTAINED: from patient GENERAL: Feels well   No recent fever, fatigue, change in appetite or weight SKIN: No itch, rash or open wounds EYES: No eye pain, dryness or itching poor vision left eye EARS: No earache, tinnitus, poor hearing right, deaf in left ear NOSE: No congestion, drainage or bleeding MOUTH/THROAT: No mouth or tooth pain  No sore throat   No difficulty chewing or swallowing RESPIRATORY: No cough, wheezing, SOB CARDIAC: No chest pain, palpitations  No edema. CHEST/BREASTS: No discomfort, discharge or lumps in breasts GI: No abdominal pain  No nausea, vomiting ,diarrhea or constipation  No heartburn or reflux  GU: No dysuria, frequency or urgency  No change in urine volume or character   MUSCULOSKELETAL: No joint pain, swelling or stiffness  No back pain  No muscle ache, pain, weakness  Gait is steady  No recent falls.  NEUROLOGIC: No dizziness, fainting, headache, numbness    PSYCHIATRIC: No feelings of anxiety, depression   does admit to significant stress related to move and spouse's illness and hospitalization. Sleeps only fair   PHYSICAL EXAM Filed Vitals:   03/14/13 1020  BP: 102/58  Pulse: 56  Temp: 97.8 F (36.6 C)  TempSrc: Oral  Height: 5\' 2"  (1.575 m)  Weight: 120 lb (54.432 kg)  SpO2: 98%   Body mass index is 21.94 kg/(m^2).  GENERAL APPEARANCE: No acute distress, appropriately groomed, normal body habitus. Alert, pleasant, conversant. SKIN: No diaphoresis, rash, wounds. Skin lower legs very dry and flaky  Masses of the right neck, under right breast and left thigh  appearance consistent with lipomas HEAD: Normocephalic, atraumatic EYES: Conjunctiva/lids clear. Pupils round, reactive. EOMs intact.  EARS: External exam WNL, canals clear, BilateralTM with significant scarring, poor hearing NOSE: No deformity or discharge. MOUTH/THROAT: Lips w/o lesions. Oral mucosa, tongue moist, w/o lesion. Oropharynx w/o redness or lesions.  NECK: Supple, full ROM. No thyroid tenderness, enlargement or nodule LYMPHATICS: No head, neck or supraclavicular adenopathy RESPIRATORY: Breathing is even, unlabored. Lung sounds are clear and full.  CHEST/BREASTS: No chest deformity. Breasts without tenderness, mass, discharge CARDIOVASCULAR: Heart RRR. No murmur or extra  heart sounds  ARTERIAL: No carotid bruit. Carotid, Femoral, Popliteal, DP pulse 2+.  VENOUS: No varicosities. No venous stasis skin changes  EDEMA: No peripheral edema.   GASTROINTESTINAL: Abdomen is soft, non-tender, not distended w/ normal bowel sounds. No hepatic or splenic enlargement. No mass, ventral or inguinal hernia. MUSCULOSKELETAL: Moves all extremities with full ROM, strength and tone. Back is without kyphosis, scoliosis or spinal process tenderness. Gait is steady NEUROLOGIC: Oriented to time, place, person. Cranial nerves 2-12 grossly intact, speech clear, no tremor. Patella, brachial DTR 2+. PSYCHIATRIC: Mood and affect appropriate to situation  ASSESSMENT/PLAN  Atrial fibrillation Heart in regular rhythm today, mild bradycardia, asymptomatic. Treatment continued with low dose amiodarone for rate control and Eliquis for anticoagulation. We'll continue to follow regularly with Dr. Aundra Dubin  HTN (hypertension) Recent blood pressure range 101- 154/63-67; satisfactory with current medication  Neck mass Soft, movable nontender mass right neck, consistent with lipoma. Patient reports this mass has been worked up including an MRI. No treatment at this time  Polycythemia vera(238.4) This condition has  been followed by Dr. Marlinda Mike in [facility]. Patient is receiving medication and labs are followed regularly. Will request most recent lab results. Patient will consider transferring hematology care to local provider.  Senile osteoporosis Patient reports she has received requests injections in the past, was recently put back on oral agent. She also repeats and DEXA bone scan in the last year. No bone pain, no recent fracture  Macular degeneration Patient is followed for some time with Dr. Zadie Rhine, recently required treatment with injections. Patient reports the vision in her left eye is not very good, though it hasn't changed recently    Follow up: Return in about 3 months (around 06/11/2013), or or as needed, for with Dr.Green for F/u new patient, BP.  Daniyah Fohl T.Michelle Vanhise, NP-C 03/14/2013

## 2013-03-18 ENCOUNTER — Encounter: Payer: Self-pay | Admitting: Geriatric Medicine

## 2013-03-18 DIAGNOSIS — R221 Localized swelling, mass and lump, neck: Secondary | ICD-10-CM | POA: Insufficient documentation

## 2013-03-18 DIAGNOSIS — M810 Age-related osteoporosis without current pathological fracture: Secondary | ICD-10-CM | POA: Insufficient documentation

## 2013-03-18 DIAGNOSIS — H3530 Unspecified macular degeneration: Secondary | ICD-10-CM | POA: Insufficient documentation

## 2013-03-18 DIAGNOSIS — D45 Polycythemia vera: Secondary | ICD-10-CM | POA: Insufficient documentation

## 2013-03-18 NOTE — Assessment & Plan Note (Signed)
Recent blood pressure range 101- 154/63-67; satisfactory with current medication

## 2013-03-18 NOTE — Assessment & Plan Note (Signed)
This condition has been followed by Dr. Marlinda Mike in [facility]. Patient is receiving medication and labs are followed regularly. Will request most recent lab results. Patient will consider transferring hematology care to local provider.

## 2013-03-18 NOTE — Assessment & Plan Note (Signed)
Soft, movable nontender mass right neck, consistent with lipoma. Patient reports this mass has been worked up including an MRI. No treatment at this time

## 2013-03-18 NOTE — Assessment & Plan Note (Signed)
Heart in regular rhythm today, mild bradycardia, asymptomatic. Treatment continued with low dose amiodarone for rate control and Eliquis for anticoagulation. We'll continue to follow regularly with Dr. Aundra Dubin

## 2013-03-18 NOTE — Assessment & Plan Note (Signed)
Patient is followed for some time with Dr. Zadie Rhine, recently required treatment with injections. Patient reports the vision in her left eye is not very good, though it hasn't changed recently

## 2013-03-18 NOTE — Assessment & Plan Note (Signed)
Patient reports she has received requests injections in the past, was recently put back on oral agent. She also repeats and DEXA bone scan in the last year. No bone pain, no recent fracture

## 2013-03-19 DIAGNOSIS — D45 Polycythemia vera: Secondary | ICD-10-CM | POA: Diagnosis not present

## 2013-03-27 ENCOUNTER — Other Ambulatory Visit: Payer: Self-pay

## 2013-03-27 MED ORDER — FUROSEMIDE 40 MG PO TABS: 40.0000 mg | ORAL_TABLET | Freq: Two times a day (BID) | ORAL | Status: DC

## 2013-04-11 DIAGNOSIS — H353290 Exudative age-related macular degeneration, unspecified eye, stage unspecified: Secondary | ICD-10-CM | POA: Diagnosis not present

## 2013-04-11 DIAGNOSIS — H35059 Retinal neovascularization, unspecified, unspecified eye: Secondary | ICD-10-CM | POA: Diagnosis not present

## 2013-04-16 ENCOUNTER — Telehealth: Payer: Self-pay | Admitting: Hematology and Oncology

## 2013-04-17 ENCOUNTER — Telehealth: Payer: Self-pay | Admitting: Hematology and Oncology

## 2013-04-17 NOTE — Telephone Encounter (Signed)
S/W PT DTR IN REF TO NP APPT.ON 04/18/13@10 :15 REFERR[address] SOBOL DX-POLYCYTHEMIA VERA DELIVERED CHART

## 2013-04-18 ENCOUNTER — Ambulatory Visit (HOSPITAL_BASED_OUTPATIENT_CLINIC_OR_DEPARTMENT_OTHER): Payer: Medicare Other | Admitting: Hematology and Oncology

## 2013-04-18 ENCOUNTER — Ambulatory Visit: Payer: Medicare Other

## 2013-04-18 ENCOUNTER — Encounter: Payer: Self-pay | Admitting: Hematology and Oncology

## 2013-04-18 VITALS — BP 128/47 | HR 85 | Temp 97.9°F | Resp 18 | Ht 62.0 in | Wt 120.4 lb

## 2013-04-18 DIAGNOSIS — M810 Age-related osteoporosis without current pathological fracture: Secondary | ICD-10-CM

## 2013-04-18 DIAGNOSIS — I4891 Unspecified atrial fibrillation: Secondary | ICD-10-CM

## 2013-04-18 DIAGNOSIS — D45 Polycythemia vera: Secondary | ICD-10-CM

## 2013-04-18 DIAGNOSIS — D47Z9 Other specified neoplasms of uncertain behavior of lymphoid, hematopoietic and related tissue: Secondary | ICD-10-CM | POA: Diagnosis not present

## 2013-04-18 NOTE — Progress Notes (Signed)
Checked in new patient with no financial issues She has not been out of the country. Her regular dr is Ardine Eng in [place], [place]. She is in [place] with hubby and will be moving. Dr in file is who she has seen here in [place].

## 2013-04-19 ENCOUNTER — Telehealth: Payer: Self-pay | Admitting: Hematology and Oncology

## 2013-04-19 NOTE — Progress Notes (Signed)
[facility] CONSULT NOTE  Patient Care Team: Kimber Relic, MD as PCP - General (Internal Medicine) [facility] Claudette Royston Sinner, NP as Nurse Practitioner (Geriatric Medicine) Edmon Crape, MD as Consulting Physician (Ophthalmology) Laurey Morale, MD as Consulting Physician (Cardiology) Artis Delay, MD as Consulting Physician (Hematology and Oncology)  CHIEF COMPLAINTS/PURPOSE OF CONSULTATION:  JAK2 positive myeloproliferative disorder, suspect polycythemia vera, on going treatment with hydroxyurea to establish care  HISTORY OF PRESENTING ILLNESS:  Sarah Mays 80 y.o. female is here because of diagnosis of myeloproliferative disorder. I reviewed her outside records and collaborated the history with the patient. The patient initially presented to primary care physician office with abnormal blood work. According to outside records, on 07/16/2010, the white blood cell count was 13.7, hemoglobin 16.6, MCV 105 and platelet count of 424. On 08/05/2011, white blood cell count 11.4, hemoglobin 21.2, hematocrit of 66%, MCV 104 and platelet count of 560,000. Peripheral blood smear and additional testing revealed she has myeloproliferative disorder, JAK 2 mutation positive with low serum erythropoietin level. I do not believe the patient had a bone marrow aspirate and biopsy done. The patient was started on hydroxyurea since July of 2013 as well as periodic phlebotomy sessions. She had multiple dosage adjustment to hydroxyurea due to abnormal blood counts. The patient has decided to move to [facility] to be closer to family and came here to establish new hematologist care. She tolerated hydroxyurea well without any side effects.  MEDICAL HISTORY:  Past Medical History  Diagnosis Date  . Atrial fibrillation   . Hypertension   . Mitral valve disorders   . Tricuspid valve disorders, specified as nonrheumatic   . Diastolic dysfunction   . Shortness of  breath   . Peripheral edema   . Neck mass     right, w/u including MRI negative  . Hypothyroidism   . Macular degeneration     Left, s/p inj. tx  . Senile osteoporosis     Reclast in the past  . Alopecia, unspecified   . Pure hypercholesterolemia   . Congestive heart failure, unspecified   . Headache(784.0)   . Backache, unspecified   . Polycythemia vera(238.4)   . Gouty arthropathy, unspecified   . Insomnia, unspecified   . Other malaise and fatigue   . Hyposmolality and/or hyponatremia   . Hyperpotassemia   . Edema   . Closed fracture of lower end of radius with ulna   . Hypercalcemia   . Deafness     Left, s/p multiple surgeries    SURGICAL HISTORY: Past Surgical History  Procedure Laterality Date  . Breast biopsy  06/18/1998    left  . Total abdominal hysterectomy    . Tonsillectomy    . Breast biopsy      SOCIAL HISTORY: History   Social History  . Marital Status: Married    Spouse Name: [name]    Number of Children: 2  . Years of Education: 16   Occupational History  . Not on file.   Social History Main Topics  . Smoking status: Former Smoker    Types: Cigarettes    Quit date: 01/19/1984  . Smokeless tobacco: Never Used  . Alcohol Use: 0.6 oz/week    1 Glasses of wine per week     Comment: 2 per day/ 14 a week  . Drug Use: No  . Sexual Activity: Not on file   Other Topics Concern  . Not on file   Social  History Narrative   Patient is Married. College graduate [name]. Of [place]). Lives in apartment,  Independent Living  section at [facility] since 02/2013.   No Smoking history, Mod. alcohol use.   Patient has  no Advanced planning documents          FAMILY HISTORY: Family History  Problem Relation Age of Onset  . Hypertension Father   . Heart disease Father   . Breast cancer Daughter   . Cancer Daughter     breast  . Ovarian cancer Mother 87  . Cancer Sister     colon  . Heart disease Sister   . Cancer Sister    . Cancer Sister     hodgkin disease    ALLERGIES:  has No Known Allergies.  MEDICATIONS:  Current Outpatient Prescriptions  Medication Sig Dispense Refill  . amiodarone (PACERONE) 200 MG tablet Take 200 mg by mouth daily.      [name] Kitchen amLODipine (NORVASC) 10 MG tablet Take 10 mg by mouth daily.      [name] Kitchen BYSTOLIC 2.5 MG tablet take 1 tablet by mouth once daily  90 tablet  1  . Cholecalciferol (VITAMIN D3) 400 UNITS CAPS Take 1 capsule by mouth daily.      . furosemide (LASIX) 40 MG tablet Take 1 tablet (40 mg total) by mouth 2 (two) times daily.  60 tablet  3  . hydrALAZINE (APRESOLINE) 50 MG tablet Take one  and 1/2 tablet three times daily      . hydroxyurea (HYDREA) 500 MG capsule Take 500 mg by mouth daily. Two capsules (1000 mg) on Mondays and Wednesdays and 500 mg daily the rest of the week.      [name] Kitchen KLOR-CON M20 20 MEQ tablet TAKE 1 TABLET BY MOUTH DAILY  30 tablet  6  . levothyroxine (SYNTHROID, LEVOTHROID) 112 MCG tablet Take 112 mcg by mouth daily before breakfast.      . magnesium oxide (MAG-OX) 400 MG tablet Take 400 mg by mouth daily.      Alveda Reasons 15 MG TABS tablet Take 15 mg by mouth daily.       No current facility-administered medications for this visit.    REVIEW OF SYSTEMS:   Constitutional: Denies fevers, chills or abnormal night sweats Eyes: Denies blurriness of vision, double vision or watery eyes Ears, nose, mouth, throat, and face: Denies mucositis or sore throat Respiratory: Denies cough, dyspnea or wheezes Cardiovascular: Denies palpitation, chest discomfort or lower extremity swelling Gastrointestinal:  Denies nausea, heartburn or change in bowel habits Skin: Denies abnormal skin rashes Lymphatics: Denies new lymphadenopathy or easy bruising Neurological:Denies numbness, tingling or new weaknesses Behavioral/Psych: Mood is stable, no new changes  All other systems were reviewed with the patient and are negative.  PHYSICAL EXAMINATION: ECOG PERFORMANCE STATUS:  0 - Asymptomatic  Filed Vitals:   04/18/13 1035  BP: 128/47  Pulse: 85  Temp: 97.9 F (36.6 C)  Resp: 18   Filed Weights   04/18/13 1035  Weight: 120 lb 6.4 oz (54.613 kg)    GENERAL:alert, no distress and comfortable SKIN: skin color, texture, turgor are normal, no rashes or significant lesions EYES: normal, conjunctiva are pink and non-injected, sclera clear OROPHARYNX:no exudate, no erythema and lips, buccal mucosa, and tongue normal  NECK: supple, thyroid normal size, non-tender, without nodularity. She have a palpable lump on the right posterior area of the neck, consistent with lipoma. LYMPH:  no palpable lymphadenopathy in the cervical, axillary or inguinal LUNGS: clear  to auscultation and percussion with normal breathing effort HEART: regular rate & rhythm and no murmurs and no lower extremity edema ABDOMEN:abdomen soft, non-tender and normal bowel sounds Musculoskeletal:no cyanosis of digits and no clubbing . No palpable splenomegaly PSYCH: alert & oriented x 3 with fluent speech NEURO: no focal motor/sensory deficits  LABORATORY DATA:  I have reviewed the data as listed Lab Results  Component Value Date   WBC 4.4* 01/25/2013   HGB 12.0 01/25/2013   HCT 35.3* 01/25/2013   MCV 117.9 Repeated and verified X2.* 01/25/2013   PLT 211.0 01/25/2013   Lab Results  Component Value Date   NA 136 09/26/2012   K 4.2 09/26/2012   CL 99 09/26/2012   CO2 31 09/26/2012   ASSESSMENT & PLAN:  #1 myeloproliferative disorder, JAK 2 mutation positive I recommend the patient to continue on current dose of hydroxyurea. It appears that she takes 500 mg daily except for Mondays and Wednesdays whereby she takes 1000 mg. Her last blood work from 03/19/2013 showed a white count of 5.1, hemoglobin of 13.4 and platelet count 226,000. For prevention against risk of thrombosis, the patient is already pn anticoagulation therapy for her atrial fibirillation. #2 lipoma CT scan of the neck dated  06/23/2012 confirmed a 2.8 cm fat attenuation mass consistent with lipoma. No further investigation is needed. #3 osteoporosis I recommend the patient to take higher dose of vitamin D #4 atrial fibrillation She will continue on anticoagulation therapy. She denies any bleeding complication. #5 preventive care The patient is up-to-date with all vaccination programs Orders Placed This Encounter  Procedures  . CBC with Differential    Standing Status: Future     Number of Occurrences:      Standing Expiration Date: 04/18/2014    All questions were answered. The patient knows to call the clinic with any problems, questions or concerns. I spent 40 minutes counseling the patient face to face. The total time spent in the appointment was 55 minutes and more than 50% was on counseling.     [place], [place], MD 04/19/2013 7:59 AM

## 2013-04-19 NOTE — Telephone Encounter (Signed)
lvm for pt regarding to June appt....mailed pt appt sched/avs adn letter °

## 2013-04-19 NOTE — Telephone Encounter (Signed)
lvm for pt regarding to June appt....mailed pt appt sched/avs and letter °

## 2013-04-30 ENCOUNTER — Other Ambulatory Visit (INDEPENDENT_AMBULATORY_CARE_PROVIDER_SITE_OTHER): Payer: Medicare Other

## 2013-04-30 DIAGNOSIS — I4891 Unspecified atrial fibrillation: Secondary | ICD-10-CM | POA: Diagnosis not present

## 2013-04-30 DIAGNOSIS — Z7901 Long term (current) use of anticoagulants: Secondary | ICD-10-CM

## 2013-05-01 LAB — CBC
HCT: 35 % — ABNORMAL LOW (ref 36.0–46.0)
HEMOGLOBIN: 11.6 g/dL — AB (ref 12.0–15.0)
MCHC: 33.3 g/dL (ref 30.0–36.0)
MCV: 119.1 fl — ABNORMAL HIGH (ref 78.0–100.0)
Platelets: 228 10*3/uL (ref 150.0–400.0)
RBC: 2.94 Mil/uL — ABNORMAL LOW (ref 3.87–5.11)
RDW: 15.9 % — ABNORMAL HIGH (ref 11.5–14.6)
WBC: 5.3 10*3/uL (ref 4.5–10.5)

## 2013-05-01 LAB — BASIC METABOLIC PANEL
BUN: 28 mg/dL — AB (ref 6–23)
CALCIUM: 10.5 mg/dL (ref 8.4–10.5)
CO2: 29 mEq/L (ref 19–32)
CREATININE: 1.3 mg/dL — AB (ref 0.4–1.2)
Chloride: 102 mEq/L (ref 96–112)
GFR: 41.81 mL/min — AB (ref >60.00)
GLUCOSE: 93 mg/dL (ref 70–99)
POTASSIUM: 4.5 meq/L (ref 3.5–5.1)
Sodium: 139 mEq/L (ref 135–145)

## 2013-05-04 DIAGNOSIS — H0019 Chalazion unspecified eye, unspecified eyelid: Secondary | ICD-10-CM | POA: Diagnosis not present

## 2013-05-04 DIAGNOSIS — Z961 Presence of intraocular lens: Secondary | ICD-10-CM | POA: Diagnosis not present

## 2013-05-10 ENCOUNTER — Other Ambulatory Visit: Payer: Self-pay | Admitting: Cardiology

## 2013-05-14 ENCOUNTER — Other Ambulatory Visit: Payer: Self-pay | Admitting: Cardiology

## 2013-05-15 ENCOUNTER — Other Ambulatory Visit: Payer: Self-pay | Admitting: *Deleted

## 2013-05-15 MED ORDER — POTASSIUM CHLORIDE CRYS ER 20 MEQ PO TBCR: EXTENDED_RELEASE_TABLET | ORAL | Status: DC

## 2013-05-16 ENCOUNTER — Encounter: Payer: Self-pay | Admitting: Geriatric Medicine

## 2013-05-16 ENCOUNTER — Non-Acute Institutional Stay: Payer: Medicare Other | Admitting: Geriatric Medicine

## 2013-05-16 VITALS — BP 120/70 | HR 60 | Temp 98.7°F | Wt 124.0 lb

## 2013-05-16 DIAGNOSIS — M549 Dorsalgia, unspecified: Secondary | ICD-10-CM | POA: Diagnosis not present

## 2013-05-16 DIAGNOSIS — N390 Urinary tract infection, site not specified: Secondary | ICD-10-CM | POA: Diagnosis not present

## 2013-05-16 DIAGNOSIS — M545 Low back pain, unspecified: Secondary | ICD-10-CM | POA: Insufficient documentation

## 2013-05-16 DIAGNOSIS — G4700 Insomnia, unspecified: Secondary | ICD-10-CM | POA: Diagnosis not present

## 2013-05-16 MED ORDER — RAMELTEON 8 MG PO TABS: 8.0000 mg | ORAL_TABLET | Freq: Every day | ORAL | Status: DC

## 2013-05-16 NOTE — Progress Notes (Signed)
Patient ID: Sarah Mays, female   DOB: 10/16/1930, 80 y.o.   MRN: [id number]   [facility] [phone] Information   Name Relation Home Work [place] Spouse [phone]  [phone]   Doristine Mango Daughter [phone]  [phone]      Chief Complaint  Patient presents with  . Back Pain    started yesterday, headache, low back pain, urine frequency, no buring with urination, no constipation.     HPI: This is a 80 y.o. female resident of [place], Independent Living  section.  Evaluation is requested today due to altered bowel symptoms; headache, low back pain, frequent urination. She denies dysuria. Reports last bowel movement this morning. Urinalysis urine was sent for analysis and has returned without any evidence of infection.        No Known Allergies  MEDICATIONS -     Medication List       This list is accurate as of: 05/16/13 11:59 PM.  Always use your most recent med list.               amiodarone 200 MG tablet  Commonly known as:  PACERONE  Take 200 mg by mouth daily.     amLODipine 10 MG tablet  Commonly known as:  NORVASC  Take 10 mg by mouth daily.     BYSTOLIC 2.5 MG tablet  Generic drug:  nebivolol  take 1 tablet by mouth once daily     furosemide 40 MG tablet  Commonly known as:  LASIX  Take 1 tablet (40 mg total) by mouth 2 (two) times daily.     hydrALAZINE 50 MG tablet  Commonly known as:  APRESOLINE  TAKE 1 1/2 TABLETS THREE TIMES DAILY.     hydroxyurea 500 MG capsule  Commonly known as:  HYDREA  Take 500 mg by mouth daily. Two capsules (1000 mg) on Mondays and Wednesdays and 500 mg daily the rest of the week.     levothyroxine 112 MCG tablet  Commonly known as:  SYNTHROID, LEVOTHROID  Take 112 mcg by mouth daily before breakfast.     potassium chloride SA 20 MEQ tablet  Commonly known as:  KLOR-CON M20  TAKE 1 TABLET BY MOUTH DAILY     ramelteon 8 MG  tablet  Commonly known as:  ROZEREM  Take 1 tablet (8 mg total) by mouth at bedtime.     Vitamin D3 400 UNITS Caps  Take 1 capsule by mouth daily.     XARELTO 15 MG Tabs tablet  Generic drug:  Rivaroxaban  Take 15 mg by mouth daily.         DATA REVIEWED  Radiologic Exams:   Laboratory Studies: [facility] 05/16/2013 Urinalysis: Yellow, clear, specific gravity 1.009,. Negative glucose, blood, protein, leukocyte esterase, nitrite, bacteria   REVIEW OF SYSTEMS  DATA OBTAINED: from patient, GENERAL: Doesn't feel well today  No recent fever, fatigue, change in appetite or weight SKIN: No itch, rash or open wounds MOUTH/THROAT: No mouth or tooth pain  No sore throat   No difficulty chewing or swallowing RESPIRATORY: No cough, wheezing, SOB CARDIAC: No chest pain, palpitations  No edema. GI: No abdominal pain  No nausea, vomiting,diarrhea or constipation  No heartburn or reflux  GU: No dysuria. Has frequency since yesterday   No change in urine volume or character  No nocturia or change in stream   MUSCULOSKELETAL: No joint pain, swelling or stiffness  Back pain, some  discomfort with walking  No muscle ache, pain, weakness  Gait is steady  No recent falls.  NEUROLOGIC: No dizziness, fainting, headache, numbness   PSYCHIATRIC: Notes anxiety re: spouse's medical condition. "This move has been very hard"  Not sleeping well.      PHYSICAL EXAM Filed Vitals:   05/16/13 1525  BP: 120/70  Pulse: 60  Temp: 98.7 F (37.1 C)  TempSrc: Oral  Weight: 124 lb (56.246 kg)   Body mass index is 22.67 kg/(m^2).  GENERAL APPEARANCE: No acute distress, appropriately groomed, normal body habitus. Alert, pleasant, conversant. SKIN: No diaphoresis, rash, unusual lesions, wounds HEAD: Normocephalic, atraumatic EYES: Conjunctiva/lids clear.   EARS: Poor Hearing, chronic RESPIRATORY: Breathing is even, unlabored. Lung sounds are clear and full.  CARDIOVASCULAR: Heart RRR. No murmur or extra  heart sounds  EDEMA: No peripheral edema. No ascites GASTROINTESTINAL: Abdomen is soft, non-tender, not distended w/ normal bowel sounds. No hepatic or splenic enlargement. LLQ hernia,easily reducible GENITOURINARY: Bladder non tender, not distended.  FEMALE: No urethral discharge, vulva lesion, vaginal bleeding, or mass. No cystocele, no rectocele MUSCULOSKELETAL: Moves all extremities with full ROM, strength and tone. Back is without kyphosis, scoliosis or spinal process tenderness. Mild lower back tenderness Gait is steady NEUROLOGIC: Oriented to time, place, person. Speech clear, no tremor. Has difficulty with recall. (When initially talking with nurse today, she reported "I don't have a doctor here"; did not recall visit with me 03/2013) PSYCHIATRIC: Mood and affect appropriate to situation   ASSESSMENT/PLAN  Back pain Mild back pain and urinary frequency today. Urinalysis without sign of infection. Unclear etiology, the patient does have history of back pain. She's also experiencing significant stress with her spouse's illness and rehabilitation stay, also notes she's not sleeping well at all lately. Have recommended Tylenol today and at bedtime, encouraged rest and she is able. Will ask the nurse to followup tomorrow  Insomnia Patient has been experiencing worsening insomnia. Previously she was able to fall asleep then awoke around 3 AM. Most recently she has not been able to fall asleep. Reports her previous PCP has tried several different medications which were not effective. The only one she can recall is [place]. Will try a course of Rozerem    Family/ staff Communication:      Labs/tests ordered:    Follow up: Return if symptoms worsen or fail to improve, for As scheduled.  Mardene Celeste, NP-C [place] [phone]  05/16/2013

## 2013-05-17 ENCOUNTER — Other Ambulatory Visit: Payer: Self-pay | Admitting: Geriatric Medicine

## 2013-05-17 DIAGNOSIS — G4700 Insomnia, unspecified: Secondary | ICD-10-CM

## 2013-05-17 MED ORDER — MIRTAZAPINE 15 MG PO TABS: 7.5000 mg | ORAL_TABLET | Freq: Every day | ORAL | Status: DC

## 2013-05-17 NOTE — Assessment & Plan Note (Signed)
Mild back pain and urinary frequency today. Urinalysis without sign of infection. Unclear etiology, the patient does have history of back pain. She's also experiencing significant stress with her spouse's illness and rehabilitation stay, also notes she's not sleeping well at all lately. Have recommended Tylenol today and at bedtime, encouraged rest and she is able. Will ask the nurse to followup tomorrow

## 2013-05-17 NOTE — Assessment & Plan Note (Signed)
Patient has been experiencing worsening insomnia. Previously she was able to fall asleep then awoke around 3 AM. Most recently she has not been able to fall asleep. Reports her previous PCP has tried several different medications which were not effective. The only one she can recall is [place]. Will try a course of Rozerem

## 2013-05-17 NOTE — Progress Notes (Signed)
Message received from pharmacy: Rozerum is too expensive- is there an alternatve? Want to avoid sedative, hypnotic due to patient appearing to have some STML. Try mirtazapine as her insomnia may have an element of depression/anxiety re: recent changes including spouse's health issue,

## 2013-06-06 DIAGNOSIS — H353290 Exudative age-related macular degeneration, unspecified eye, stage unspecified: Secondary | ICD-10-CM | POA: Diagnosis not present

## 2013-06-06 DIAGNOSIS — H35059 Retinal neovascularization, unspecified, unspecified eye: Secondary | ICD-10-CM | POA: Diagnosis not present

## 2013-06-10 ENCOUNTER — Other Ambulatory Visit: Payer: Self-pay | Admitting: Cardiology

## 2013-06-11 ENCOUNTER — Encounter: Payer: Medicare Other | Admitting: Internal Medicine

## 2013-06-13 ENCOUNTER — Other Ambulatory Visit: Payer: Self-pay | Admitting: *Deleted

## 2013-06-13 DIAGNOSIS — D45 Polycythemia vera: Secondary | ICD-10-CM

## 2013-06-13 MED ORDER — HYDROXYUREA 500 MG PO CAPS: 500.0000 mg | ORAL_CAPSULE | Freq: Every day | ORAL | Status: DC

## 2013-06-19 ENCOUNTER — Other Ambulatory Visit: Payer: Self-pay

## 2013-06-19 MED ORDER — AMIODARONE HCL 200 MG PO TABS: ORAL_TABLET | ORAL | Status: DC

## 2013-06-19 MED ORDER — AMLODIPINE BESYLATE 10 MG PO TABS
10.0000 mg | ORAL_TABLET | Freq: Every day | ORAL | Status: DC
Start: 2013-06-19 — End: 2013-07-12

## 2013-07-10 ENCOUNTER — Other Ambulatory Visit: Payer: Self-pay | Admitting: Cardiology

## 2013-07-12 ENCOUNTER — Encounter: Payer: Self-pay | Admitting: Hematology and Oncology

## 2013-07-12 ENCOUNTER — Other Ambulatory Visit (HOSPITAL_BASED_OUTPATIENT_CLINIC_OR_DEPARTMENT_OTHER): Payer: Medicare Other

## 2013-07-12 ENCOUNTER — Ambulatory Visit (HOSPITAL_BASED_OUTPATIENT_CLINIC_OR_DEPARTMENT_OTHER): Payer: Medicare Other | Admitting: Hematology and Oncology

## 2013-07-12 ENCOUNTER — Telehealth: Payer: Self-pay | Admitting: Hematology and Oncology

## 2013-07-12 VITALS — BP 138/64 | HR 59 | Temp 98.0°F | Resp 18 | Ht 62.0 in | Wt 121.4 lb

## 2013-07-12 DIAGNOSIS — D45 Polycythemia vera: Secondary | ICD-10-CM

## 2013-07-12 DIAGNOSIS — R221 Localized swelling, mass and lump, neck: Secondary | ICD-10-CM

## 2013-07-12 DIAGNOSIS — I4891 Unspecified atrial fibrillation: Secondary | ICD-10-CM

## 2013-07-12 DIAGNOSIS — R220 Localized swelling, mass and lump, head: Secondary | ICD-10-CM | POA: Diagnosis not present

## 2013-07-12 DIAGNOSIS — Z7901 Long term (current) use of anticoagulants: Secondary | ICD-10-CM | POA: Diagnosis not present

## 2013-07-12 LAB — CBC WITH DIFFERENTIAL/PLATELET
BASO%: 1.2 % (ref 0.0–2.0)
Basophils Absolute: 0.1 10*3/uL (ref 0.0–0.1)
EOS ABS: 0 10*3/uL (ref 0.0–0.5)
EOS%: 1 % (ref 0.0–7.0)
HCT: 36.2 % (ref 34.8–46.6)
HGB: 12.2 g/dL (ref 11.6–15.9)
LYMPH%: 24.8 % (ref 14.0–49.7)
MCH: 39.4 pg — ABNORMAL HIGH (ref 25.1–34.0)
MCHC: 33.7 g/dL (ref 31.5–36.0)
MCV: 117.1 fL — AB (ref 79.5–101.0)
MONO#: 0.6 10*3/uL (ref 0.1–0.9)
MONO%: 12.3 % (ref 0.0–14.0)
NEUT%: 60.7 % (ref 38.4–76.8)
NEUTROS ABS: 2.9 10*3/uL (ref 1.5–6.5)
PLATELETS: 210 10*3/uL (ref 145–400)
RBC: 3.09 10*6/uL — ABNORMAL LOW (ref 3.70–5.45)
RDW: 14.9 % — ABNORMAL HIGH (ref 11.2–14.5)
WBC: 4.7 10*3/uL (ref 3.9–10.3)
lymph#: 1.2 10*3/uL (ref 0.9–3.3)

## 2013-07-12 MED ORDER — HYDROXYUREA 500 MG PO CAPS: ORAL_CAPSULE | ORAL | Status: DC

## 2013-07-12 NOTE — Assessment & Plan Note (Addendum)
The blood shows she has progressive mild leukopenia and intermittent anemia. I recommend she reduce her prescription to 500 mg daily every day except 1 day a week she takes 2 tablets. Plan to see her back again in 3 months and if her blood count is still borderline low, but reduce it completely to one tablet a day. The patient will continue on her anticoagulation therapy to prevent risk of thrombosis.

## 2013-07-12 NOTE — Assessment & Plan Note (Signed)
Examination today revealed that she is rate controlled. She will continue on anticoagulation therapy. She denies bleeding complications.

## 2013-07-12 NOTE — Telephone Encounter (Signed)
gv adn rpinted appt sched and avs for pt fro [facility]

## 2013-07-12 NOTE — Assessment & Plan Note (Signed)
CT scan dated 06/23/2012 confirmed mass consistent with lipoma. No further investigation is needed.   

## 2013-07-12 NOTE — Progress Notes (Signed)
[facility] OFFICE PROGRESS NOTE  Patient Care Team: Estill Dooms, MD as PCP - General (Internal Medicine) [place] Jeri Cos, NP as Nurse Practitioner (Geriatric Medicine) Hurman Horn, MD as Consulting Physician (Ophthalmology) Larey Dresser, MD as Consulting Physician (Cardiology) Heath Lark, MD as Consulting Physician (Hematology and Oncology)  SUMMARY OF ONCOLOGIC HISTORY:   Polycythemia vera(238.4)   08/05/2011 Pathology Peripheral blood JAK2 mutation was positive with low serum erythropoietin level. Bone marrow aspirate and biopsy was not performed.   08/12/2011 -  Chemotherapy She is started on hydroxyurea with periodic phlebotomy.    INTERVAL HISTORY: Please see below for problem oriented charting. She is doing well. Denies new symptoms.  REVIEW OF SYSTEMS:   Constitutional: Denies fevers, chills or abnormal weight loss Eyes: Denies blurriness of vision Ears, nose, mouth, throat, and face: Denies mucositis or sore throat Respiratory: Denies cough, dyspnea or wheezes Cardiovascular: Denies palpitation, chest discomfort or lower extremity swelling Gastrointestinal:  Denies nausea, heartburn or change in bowel habits Skin: Denies abnormal skin rashes Lymphatics: Denies new lymphadenopathy or easy bruising Neurological:Denies numbness, tingling or new weaknesses Behavioral/Psych: Mood is stable, no new changes  All other systems were reviewed with the patient and are negative.  I have reviewed the past medical history, past surgical history, social history and family history with the patient and they are unchanged from previous note.  ALLERGIES:  has No Known Allergies.  MEDICATIONS:  Current Outpatient Prescriptions  Medication Sig Dispense Refill  . amiodarone (PACERONE) 200 MG tablet Take 1/2 tab (100 mg) by mouth daily.  15 tablet  6  . BYSTOLIC 2.5 MG tablet take 1 tablet by mouth once daily  90 tablet  1  .  Cholecalciferol (VITAMIN D3) 400 UNITS CAPS Take 1 capsule by mouth daily.      . furosemide (LASIX) 40 MG tablet Take 1 tablet (40 mg total) by mouth 2 (two) times daily.  60 tablet  3  . hydrALAZINE (APRESOLINE) 50 MG tablet TAKE 1 1/2 TABLETS THREE TIMES DAILY.  135 tablet  0  . hydroxyurea (HYDREA) 500 MG capsule Two capsules (1000 mg) on Mondays and 500 mg daily the rest of the week, new change since 07/12/13  40 capsule  3  . levothyroxine (SYNTHROID, LEVOTHROID) 112 MCG tablet Take 112 mcg by mouth daily before breakfast.      . mirtazapine (REMERON) 15 MG tablet Take 0.5 tablets (7.5 mg total) by mouth at bedtime.  15 tablet  3  . potassium chloride SA (KLOR-CON M20) 20 MEQ tablet TAKE 1 TABLET BY MOUTH DAILY  30 tablet  6  . XARELTO 15 MG TABS tablet Take 15 mg by mouth daily.       No current facility-administered medications for this visit.    PHYSICAL EXAMINATION: ECOG PERFORMANCE STATUS: 0 - Asymptomatic  Filed Vitals:   07/12/13 1156  BP: 138/64  Pulse: 59  Temp: 98 F (36.7 C)  Resp: 18   Filed Weights   07/12/13 1151 07/12/13 1156  Weight: 121 lb 6.4 oz (55.067 kg) 121 lb 6.4 oz (55.067 kg)    GENERAL:alert, no distress and comfortable SKIN: skin color, texture, turgor are normal, no rashes or significant lesions EYES: normal, Conjunctiva are pink and non-injected, sclera clear OROPHARYNX:no exudate, no erythema and lips, buccal mucosa, and tongue normal  NECK: supple, thyroid normal size, non-tender, without nodularity. Noted lipoma behind her right side of the neck. LYMPH:  no palpable lymphadenopathy in  the cervical, axillary or inguinal LUNGS: clear to auscultation and percussion with normal breathing effort HEART: irregular in rate and rhythm and no murmurs and no lower extremity edema ABDOMEN:abdomen soft, non-tender and normal bowel sounds Musculoskeletal:no cyanosis of digits and no clubbing  NEURO: alert & oriented x 3 with fluent speech, no focal  motor/sensory deficits  LABORATORY DATA:  I have reviewed the data as listed    Component Value Date/Time   NA 139 04/30/2013 1628   K 4.5 04/30/2013 1628   CL 102 04/30/2013 1628   CO2 29 04/30/2013 1628   GLUCOSE 93 04/30/2013 1628   BUN 28* 04/30/2013 1628   CREATININE 1.3* 04/30/2013 1628   CREATININE 1.33* 02/08/2012 1624   CALCIUM 10.5 04/30/2013 1628   PROT 6.6 01/25/2013 1441   ALBUMIN 4.7 01/25/2013 1441   AST 18 01/25/2013 1441   ALT 18 01/25/2013 1441   ALKPHOS 52 01/25/2013 1441   BILITOT 0.8 01/25/2013 1441    No results found for this basename: SPEP, UPEP,  kappa and lambda light chains    Lab Results  Component Value Date   WBC 4.7 07/12/2013   NEUTROABS 2.9 07/12/2013   HGB 12.2 07/12/2013   HCT 36.2 07/12/2013   MCV 117.1* 07/12/2013   PLT 210 07/12/2013      Chemistry      Component Value Date/Time   NA 139 04/30/2013 1628   K 4.5 04/30/2013 1628   CL 102 04/30/2013 1628   CO2 29 04/30/2013 1628   BUN 28* 04/30/2013 1628   CREATININE 1.3* 04/30/2013 1628   CREATININE 1.33* 02/08/2012 1624      Component Value Date/Time   CALCIUM 10.5 04/30/2013 1628   ALKPHOS 52 01/25/2013 1441   AST 18 01/25/2013 1441   ALT 18 01/25/2013 1441   BILITOT 0.8 01/25/2013 1441     ASSESSMENT & PLAN:  Polycythemia vera(238.4) The blood shows she has progressive mild leukopenia and intermittent anemia. I recommend she reduce her prescription to 500 mg daily every day except 1 day a week she takes 2 tablets. Plan to see her back again in 3 months and if her blood count is still borderline low, but reduce it completely to one tablet a day. The patient will continue on her anticoagulation therapy to prevent risk of thrombosis.  Neck mass CT scan dated 06/23/2012 confirmed mass consistent with lipoma. No further investigation is needed.  Atrial fibrillation Examination today revealed that she is rate controlled. She will continue on anticoagulation therapy. She denies bleeding  complications.   Orders Placed This Encounter  Procedures  . CBC with Differential    Standing Status: Future     Number of Occurrences:      Standing Expiration Date: 07/12/2014   All questions were answered. The patient knows to call the clinic with any problems, questions or concerns. No barriers to learning was detected. I spent 15 minutes counseling the patient face to face. The total time spent in the appointment was 20 minutes and more than 50% was on counseling and review of test results     Heath Lark, MD 07/12/2013 9:28 PM

## 2013-07-20 ENCOUNTER — Encounter: Payer: Self-pay | Admitting: Cardiology

## 2013-07-20 ENCOUNTER — Ambulatory Visit (INDEPENDENT_AMBULATORY_CARE_PROVIDER_SITE_OTHER): Payer: Medicare Other | Admitting: Cardiology

## 2013-07-20 VITALS — BP 140/65 | HR 53 | Ht 62.0 in | Wt 120.8 lb

## 2013-07-20 DIAGNOSIS — I4891 Unspecified atrial fibrillation: Secondary | ICD-10-CM

## 2013-07-20 DIAGNOSIS — I498 Other specified cardiac arrhythmias: Secondary | ICD-10-CM | POA: Diagnosis not present

## 2013-07-20 DIAGNOSIS — I5032 Chronic diastolic (congestive) heart failure: Secondary | ICD-10-CM

## 2013-07-20 DIAGNOSIS — R001 Bradycardia, unspecified: Secondary | ICD-10-CM

## 2013-07-20 DIAGNOSIS — I509 Heart failure, unspecified: Secondary | ICD-10-CM | POA: Diagnosis not present

## 2013-07-20 LAB — BASIC METABOLIC PANEL
BUN: 19 mg/dL (ref 6–23)
CALCIUM: 11.1 mg/dL — AB (ref 8.4–10.5)
CO2: 32 meq/L (ref 19–32)
CREATININE: 0.9 mg/dL (ref 0.4–1.2)
Chloride: 100 mEq/L (ref 96–112)
GFR: 64.7 mL/min (ref >60.00)
GLUCOSE: 86 mg/dL (ref 70–99)
Potassium: 4.1 mEq/L (ref 3.5–5.1)
SODIUM: 139 meq/L (ref 135–145)

## 2013-07-20 LAB — CBC WITH DIFFERENTIAL/PLATELET
Basophils Absolute: 0 10*3/uL (ref 0.0–0.1)
Basophils Relative: 0.4 % (ref 0.0–3.0)
EOS ABS: 0 10*3/uL (ref 0.0–0.7)
EOS PCT: 0.5 % (ref 0.0–5.0)
HEMATOCRIT: 35.4 % — AB (ref 36.0–46.0)
HEMOGLOBIN: 11.8 g/dL — AB (ref 12.0–15.0)
LYMPHS ABS: 1.6 10*3/uL (ref 0.7–4.0)
Lymphocytes Relative: 27.2 % (ref 12.0–46.0)
MCHC: 33.4 g/dL (ref 30.0–36.0)
MONO ABS: 0.7 10*3/uL (ref 0.1–1.0)
Monocytes Relative: 12.5 % — ABNORMAL HIGH (ref 3.0–12.0)
NEUTROS ABS: 3.4 10*3/uL (ref 1.4–7.7)
Neutrophils Relative %: 59.4 % (ref 43.0–77.0)
Platelets: 240 10*3/uL (ref 150.0–400.0)
RBC: 3.03 Mil/uL — AB (ref 3.87–5.11)
RDW: 15.5 % (ref 11.5–15.5)
WBC: 5.8 10*3/uL (ref 4.0–10.5)

## 2013-07-20 LAB — HEPATIC FUNCTION PANEL
ALT: 20 U/L (ref 0–35)
AST: 18 U/L (ref 0–37)
Albumin: 4.4 g/dL (ref 3.5–5.2)
Alkaline Phosphatase: 53 U/L (ref 39–117)
BILIRUBIN DIRECT: 0.2 mg/dL (ref 0.0–0.3)
Total Bilirubin: 0.7 mg/dL (ref 0.2–1.2)
Total Protein: 6.7 g/dL (ref 6.0–8.3)

## 2013-07-20 LAB — TSH: TSH: 0.35 u[IU]/mL (ref 0.35–4.50)

## 2013-07-20 NOTE — Patient Instructions (Signed)
Your physician recommends that you have  lab work today--Liver profile/CBCd/TSH/BMET.  Your physician wants you to follow-up in: 6 months with Dr Aundra Dubin. (December 2015).  You will receive a reminder letter in the mail two months in advance. If you don't receive a letter, please call our office to schedule the follow-up appointment.

## 2013-07-22 NOTE — Progress Notes (Signed)
Patient ID: Sarah Mays, female   DOB: 1930-10-19, 80 y.o.   MRN: [id number] PCP: Dr. Charolette Forward  80 yo with paroxysmal atrial fibrillation and chronic diastolic CHF returns for cardiology evaluation.  She remains in NSR today with no tachypalpitations.  Weight is down.  She has been feeling good in general.  She gets mildly short of breath walking up steps.  She does not get short of breath walking on flat ground.  She is now at [employer] and has been doing a lot of walking.  No chest pain.  No orthopnea or PND.  Leg edema remains minimal.  No bleeding sequelae on apixaban.  Her husband has been sick recently.  ECG: NSR, PACs, LVH, nonspecific T wave flattening  Labs (7/13): K 4.7, creatinine 0.56 Labs (8/13): K 5.4, creatinine 0.69 Labs (11/13): TSH 6.45, K 5.4=>3.8, creatinine 1.5=>1.2, BUN 70=>35, HCT 33.2, BNP 866, AST 25, ALT 36 Labs (12/13): K 3.5 => 2.8, creatinine 1.2 => 1.33, BUN 56 Labs (1/14): LDL 85, HDL 86, K 3.9, creatinine 1.0, BNP 447 Labs (4/14): K 4.1, creatinine 0.9, BNP 293, LFTs normal, TSH normal Labs (8/14): K 4.3, creatinine 1.2, LFTs normal, TSH normal Labs (12/14): LFTs normal, TSH normal Labs (3/15): K 4.5, creatinine 1.3, HCT 36.2  PMH: 1. Atrial fibrillation: Diagnosed initially in 10/13.  Holter monitor in 11/13 showed atrial fibrillation with average rate 65.  Back in NSR on amiodarone.   2. Chronic diastolic CHF: Echo (19/50) with EF 65-70%, mild MR, moderate biatrial enlargement, moderate TR, PA systolic pressure 45 mmHg.   3. ETT-Sestamibi (11/13): Exercised to stage II, small partially reversible anteroapical perfusion defect suggesting mild ischemia versus attenuation, EF 80%.  4. HTN: Lower extremity swelling with amlodipine.  5. Polycythemia vera: Has had phlebotomy only once.  6. Hypothyroidism 7. Macular degeneration 8. TAH 1980 9. Familial hypocalciuric hypercalcemia 10. PFTs (amiodarone use) in 1/14 showed a mild obstructive defect.   SH:  Prior smoker (years ago).  Married, lives in [place].  Occasional ETOH.  Daughter works for [employer].   FH: CAD  ROS: All systems reviewed and negative except as per HPI.   Current Outpatient Prescriptions  Medication Sig Dispense Refill  . amiodarone (PACERONE) 200 MG tablet Take 1/2 tab (100 mg) by mouth daily.  15 tablet  6  . BYSTOLIC 2.5 MG tablet take 1 tablet by mouth once daily  90 tablet  1  . Cholecalciferol (VITAMIN D3) 400 UNITS CAPS Take 1 capsule by mouth daily.      . furosemide (LASIX) 40 MG tablet Take 1 tablet (40 mg total) by mouth 2 (two) times daily.  60 tablet  3  . hydrALAZINE (APRESOLINE) 50 MG tablet TAKE 1 1/2 TABLETS THREE TIMES DAILY.  135 tablet  0  . hydroxyurea (HYDREA) 500 MG capsule Two capsules (1000 mg) on Mondays and 500 mg daily the rest of the week, new change since 07/12/13  40 capsule  3  . levothyroxine (SYNTHROID, LEVOTHROID) 112 MCG tablet Take 112 mcg by mouth daily before breakfast.      . potassium chloride SA (KLOR-CON M20) 20 MEQ tablet TAKE 1 TABLET BY MOUTH DAILY  30 tablet  6  . XARELTO 15 MG TABS tablet Take 15 mg by mouth daily.       No current facility-administered medications for this visit.    BP 140/65  Pulse 53  Ht 5\' 2"  (1.575 m)  Wt 54.795 kg (120 lb 12.8 oz)  BMI 22.09  kg/m2 General: NAD Neck: No JVD, no thyromegaly or thyroid nodule.  Lungs: Slight crackles at bases bilaterally.  CV: Nondisplaced PMI.  Heart irregular S1/S2, no S3/S4, 1/6 early SEM.  No edema.  No carotid bruit.  Abdomen: Soft, nontender, no hepatosplenomegaly, no distention.  Neurologic: Alert and oriented x 3.  Psych: Normal affect. Extremities: No clubbing or cyanosis.   Assessment/Plan:   1. Atrial fibrillation: First noted in 10/13.  Her increased exertional dyspnea seems to date from around that time.  I suspect that the onset of atrial fibrillation probably triggered a diastolic CHF exacerbation. CHADSVASC score is 78 (age, female gender, HTN,  CHF).  She is anticoagulated with Eliquis 2.5 mg bid.  I would agree with the reduced dose of Eliquis given her age (80) and small size (< 60 kg).  Given her moderately enlarged atria, I thought that she would need an antiarrhythmic for the best chance to maintain NSR after cardioversion.  She is now on amiodarone at low dose (100 mg daily) and has held NSR.   - She needs LFTs and TSH checked today.  She will need yearly eye exams.  Baseline PFTs showed a mild obstructive defect.  - Check CBC given Eliquis use.  - Continue low dose nebivolol. 2. CAD: Patient has not had any chest pain.  She has had exertional dyspnea that seems to date from around the likely onset of her atrial fibrillation.  She had an ETT-Sestamibi in 11/13 that showed a small partially reversible anteroapical perfusion defect.  This may be a small area of ischemia but soft tissue attenuation is certainly also possible.  This does not sound like a high risk study.  Her exertional dyspnea is much improved now that she is back in NSR.  No chest pain.  Would continue to manage medically.  3. Chronic diastolic CHF: No JVD and leg edema resolved after stopping amlodipine.  - Continue current Lasix at 40 mg bid.  4. HTN: BP is acceptable.   Loralie Champagne 07/22/2013

## 2013-07-27 ENCOUNTER — Other Ambulatory Visit: Payer: Self-pay | Admitting: Cardiology

## 2013-08-07 ENCOUNTER — Other Ambulatory Visit: Payer: Self-pay | Admitting: Cardiology

## 2013-08-14 DIAGNOSIS — H35059 Retinal neovascularization, unspecified, unspecified eye: Secondary | ICD-10-CM | POA: Diagnosis not present

## 2013-08-14 DIAGNOSIS — H353290 Exudative age-related macular degeneration, unspecified eye, stage unspecified: Secondary | ICD-10-CM | POA: Diagnosis not present

## 2013-08-27 ENCOUNTER — Other Ambulatory Visit: Payer: Self-pay | Admitting: Cardiology

## 2013-08-29 ENCOUNTER — Other Ambulatory Visit: Payer: Self-pay

## 2013-08-29 MED ORDER — NEBIVOLOL HCL 2.5 MG PO TABS: ORAL_TABLET | ORAL | Status: DC

## 2013-08-30 ENCOUNTER — Other Ambulatory Visit: Payer: Self-pay | Admitting: *Deleted

## 2013-08-30 MED ORDER — NEBIVOLOL HCL 2.5 MG PO TABS: ORAL_TABLET | ORAL | Status: DC

## 2013-08-30 NOTE — Telephone Encounter (Signed)
Pt is calling for refills on her Bystolic. Pt is aware refills was sent to pharmacy

## 2013-08-31 DIAGNOSIS — H353190 Nonexudative age-related macular degeneration, unspecified eye, stage unspecified: Secondary | ICD-10-CM | POA: Diagnosis not present

## 2013-08-31 DIAGNOSIS — H353290 Exudative age-related macular degeneration, unspecified eye, stage unspecified: Secondary | ICD-10-CM | POA: Diagnosis not present

## 2013-08-31 DIAGNOSIS — H02839 Dermatochalasis of unspecified eye, unspecified eyelid: Secondary | ICD-10-CM | POA: Diagnosis not present

## 2013-08-31 DIAGNOSIS — Z961 Presence of intraocular lens: Secondary | ICD-10-CM | POA: Diagnosis not present

## 2013-08-31 LAB — HM DIABETES EYE EXAM

## 2013-09-05 ENCOUNTER — Ambulatory Visit: Payer: Medicare Other | Admitting: Family Medicine

## 2013-09-11 ENCOUNTER — Encounter: Payer: Self-pay | Admitting: Family Medicine

## 2013-09-11 ENCOUNTER — Ambulatory Visit (INDEPENDENT_AMBULATORY_CARE_PROVIDER_SITE_OTHER)
Admission: RE | Admit: 2013-09-11 | Discharge: 2013-09-11 | Disposition: A | Discharge status: Home / Self Care | Payer: Medicare Other | Source: Ambulatory Visit | Attending: Family Medicine | Admitting: Family Medicine

## 2013-09-11 ENCOUNTER — Ambulatory Visit (INDEPENDENT_AMBULATORY_CARE_PROVIDER_SITE_OTHER): Payer: Medicare Other | Admitting: Family Medicine

## 2013-09-11 VITALS — BP 108/64 | HR 47 | Ht 62.5 in | Wt 125.0 lb

## 2013-09-11 DIAGNOSIS — M5137 Other intervertebral disc degeneration, lumbosacral region: Secondary | ICD-10-CM | POA: Diagnosis not present

## 2013-09-11 DIAGNOSIS — M545 Low back pain, unspecified: Secondary | ICD-10-CM

## 2013-09-11 MED ORDER — TRAMADOL HCL 50 MG PO TABS: 50.0000 mg | ORAL_TABLET | Freq: Every evening | ORAL | Status: DC | PRN

## 2013-09-11 NOTE — Progress Notes (Signed)
  [name] [address], [place] [address] Phone: [phone] Subjective:     CC: back pain  BJY:[id number] Sarah Mays is a 80 y.o. female coming in with complaint of back pain. Patient is a past medical history for severe osteoporosis. Patient last week is starting to have significant amount of pain in her lumbar spine. Patient was unable to actually do any activity he was almost bedridden. Patient states though that over the course of time and as well as with Tylenol and heating packs it has improved. Patient states that she is about 60% better. Denies any radiation into her legs or any numbness. Still has some mild tightness but is now able to do daily activities. Patient denies any fevers or chills or any abnormal weight loss. Patient has not seen another provider for this. Patient rates the pain today is 3/10 in severity.  Reviewing patient's chart last lumbar x-rays weren't 2010 were unremarkable.     Past medical history, social, surgical and family history all reviewed in electronic medical record.   Review of Systems: No headache, visual changes, nausea, vomiting, diarrhea, constipation, dizziness, abdominal pain, skin rash, fevers, chills, night sweats, weight loss, swollen lymph nodes, body aches, joint swelling, muscle aches, chest pain, shortness of breath, mood changes.   Objective Blood pressure 108/64, pulse 47, height 5' 2.5" (1.588 m), weight 125 lb (56.7 kg), SpO2 94.00%.  General: No apparent distress alert and oriented x3 mood and affect normal, dressed appropriately.  HEENT: Pupils equal, extraocular movements intact  Respiratory: Patient's speak in full sentences and does not appear short of breath  Cardiovascular: No lower extremity edema, non tender, no erythema  Skin: Warm dry intact with no signs of infection or rash on extremities or on axial skeleton.  Abdomen: Soft nontender  Neuro: Cranial nerves II through XII  are intact, neurovascularly intact in all extremities with 2+ DTRs and 2+ pulses.  Lymph: No lymphadenopathy of posterior or anterior cervical chain or axillae bilaterally.  Gait normal with good balance and coordination.  MSK:  Non tender with full range of motion and good stability and symmetric strength and tone of shoulders, elbows, wrist, hip, knee and ankles bilaterally.  Back Exam:  Inspection: Unremarkable  Motion: Flexion 35 deg, Extension 35 deg, Side Bending to 30 deg bilaterally,  Rotation to 35 deg bilaterally  SLR laying: Negative  XSLR laying: Negative  Palpable tenderness: Minimally tender over the paraspinal musculature of the lumbar spine no spinous process tenderness.. FABER: negative. Sensory change: Gross sensation intact to all lumbar and sacral dermatomes.  Reflexes: 2+ at both patellar tendons, 2+ at achilles tendons, Babinski's downgoing.  Strength at foot  Plantar-flexion: 5/5 Dorsi-flexion: 5/5 Eversion: 5/5 Inversion: 5/5  Leg strength  Quad: 5/5 Hamstring: 4/5 Hip flexor: 4/5 Hip abductors: 3/5  Gait unremarkable.    Impression and Recommendations:     This case required medical decision making of moderate complexity.

## 2013-09-11 NOTE — Assessment & Plan Note (Signed)
Patient's back pain is likely secondary to her osteoporosis and probably some underlying osteoarthritic changes. In addition this patient does have a history of gouty arthropathy. At this point would patient actually improving with conservative therapy patient was given recommendations of over-the-counter medications he was given a prescription for tramadol in case she has any flares. In addition to this I would like to get x-rays of her back to have a baseline and to evaluate for any type compression fracture that could have occurred secondary to her osteoporosis. We discussed icing as well as heat regimens. Patient will try these interventions and can come back for any other exacerbation.

## 2013-09-11 NOTE — Patient Instructions (Addendum)
I am so sorry forthe confusion.  I am glad you are doing better Lets get xrays to know for sure.  Ice is better than heat.  When having pain tylenol 3 times a day can help.  Continue the other medicines Tramadol at night when having the pain.   See me when you need me.  Also if anything please when call ask for me or lindsey specifically.

## 2013-10-10 ENCOUNTER — Telehealth: Payer: Self-pay | Admitting: Cardiology

## 2013-10-10 NOTE — Telephone Encounter (Signed)
Follow Up  Pt returned call from nurse.[name] Kitchen Please call back to assist

## 2013-10-10 NOTE — Telephone Encounter (Signed)
Spoke with patient.

## 2013-10-10 NOTE — Telephone Encounter (Signed)
LMTCB

## 2013-10-11 ENCOUNTER — Other Ambulatory Visit (HOSPITAL_BASED_OUTPATIENT_CLINIC_OR_DEPARTMENT_OTHER): Payer: Medicare Other

## 2013-10-11 ENCOUNTER — Encounter: Payer: Self-pay | Admitting: Hematology and Oncology

## 2013-10-11 ENCOUNTER — Ambulatory Visit (HOSPITAL_BASED_OUTPATIENT_CLINIC_OR_DEPARTMENT_OTHER): Payer: Medicare Other | Admitting: Hematology and Oncology

## 2013-10-11 VITALS — BP 152/53 | HR 52 | Temp 97.8°F | Resp 18 | Ht 62.5 in | Wt 125.9 lb

## 2013-10-11 DIAGNOSIS — I5032 Chronic diastolic (congestive) heart failure: Secondary | ICD-10-CM

## 2013-10-11 DIAGNOSIS — R220 Localized swelling, mass and lump, head: Secondary | ICD-10-CM

## 2013-10-11 DIAGNOSIS — D45 Polycythemia vera: Secondary | ICD-10-CM

## 2013-10-11 DIAGNOSIS — D72819 Decreased white blood cell count, unspecified: Secondary | ICD-10-CM | POA: Diagnosis not present

## 2013-10-11 DIAGNOSIS — D649 Anemia, unspecified: Secondary | ICD-10-CM | POA: Diagnosis not present

## 2013-10-11 DIAGNOSIS — R221 Localized swelling, mass and lump, neck: Secondary | ICD-10-CM

## 2013-10-11 DIAGNOSIS — I10 Essential (primary) hypertension: Secondary | ICD-10-CM | POA: Diagnosis not present

## 2013-10-11 DIAGNOSIS — Z23 Encounter for immunization: Secondary | ICD-10-CM

## 2013-10-11 DIAGNOSIS — I509 Heart failure, unspecified: Secondary | ICD-10-CM | POA: Diagnosis not present

## 2013-10-11 LAB — CBC WITH DIFFERENTIAL/PLATELET
BASO%: 0.7 % (ref 0.0–2.0)
Basophils Absolute: 0.1 10*3/uL (ref 0.0–0.1)
EOS ABS: 0 10*3/uL (ref 0.0–0.5)
EOS%: 0.5 % (ref 0.0–7.0)
HEMATOCRIT: 37.5 % (ref 34.8–46.6)
HEMOGLOBIN: 12.2 g/dL (ref 11.6–15.9)
LYMPH#: 1.3 10*3/uL (ref 0.9–3.3)
LYMPH%: 16.5 % (ref 14.0–49.7)
MCH: 37.7 pg — ABNORMAL HIGH (ref 25.1–34.0)
MCHC: 32.6 g/dL (ref 31.5–36.0)
MCV: 115.8 fL — ABNORMAL HIGH (ref 79.5–101.0)
MONO#: 0.8 10*3/uL (ref 0.1–0.9)
MONO%: 9.8 % (ref 0.0–14.0)
NEUT%: 72.5 % (ref 38.4–76.8)
NEUTROS ABS: 5.7 10*3/uL (ref 1.5–6.5)
Platelets: 260 10*3/uL (ref 145–400)
RBC: 3.24 10*6/uL — ABNORMAL LOW (ref 3.70–5.45)
RDW: 14.9 % — AB (ref 11.2–14.5)
WBC: 7.8 10*3/uL (ref 3.9–10.3)

## 2013-10-11 MED ORDER — INFLUENZA VAC SPLIT QUAD 0.5 ML IM SUSY
0.5000 mL | PREFILLED_SYRINGE | Freq: Once | INTRAMUSCULAR | Status: AC
  Administered 2013-10-11: 0.5 mL via INTRAMUSCULAR
  Filled 2013-10-11: qty 0.5

## 2013-10-11 NOTE — Progress Notes (Signed)
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 Bone marrow aspirate and biopsy was not performed.   08/12/2011 -  Chemotherapy She is started on hydroxyurea with periodic phlebotomy.    INTERVAL HISTORY: Please see below for problem oriented charting. She tolerated chemotherapy well. Denies recent blood clots. No recent bleeding.  REVIEW OF SYSTEMS:   Constitutional: Denies fevers, chills or abnormal weight loss Eyes: Denies blurriness of vision Ears, nose, mouth, throat, and face: Denies mucositis or sore throat Respiratory: Denies cough, dyspnea or wheezes Cardiovascular: Denies palpitation, chest discomfort or lower extremity swelling Gastrointestinal:  Denies nausea, heartburn or change in bowel habits Skin: Denies abnormal skin rashes Lymphatics: Denies new lymphadenopathy or easy bruising Neurological:Denies numbness, tingling or new weaknesses Behavioral/Psych: Mood is stable, no new changes  All other systems were reviewed with the patient and are negative.  I have reviewed the past medical history, past surgical history, social history and family history with the patient and they are unchanged from previous note.  ALLERGIES:  has No Known Allergies.  MEDICATIONS:  Current Outpatient Prescriptions  Medication Sig Dispense Refill  . amiodarone (PACERONE) 200 MG tablet Take 1/2 tab (100 mg) by mouth daily.  15 tablet  6  . Cholecalciferol (VITAMIN D3) 400  UNITS CAPS Take 1 capsule by mouth daily.      . furosemide (LASIX) 40 MG tablet TAKE 1 TABLET TWICE DAILY.  60 tablet  5  . hydrALAZINE (APRESOLINE) 50 MG tablet TAKE 1 1/2 TABLETS THREE TIMES DAILY.  135 tablet  3  . hydroxyurea (HYDREA) 500 MG capsule Two capsules (1000 mg) on Mondays and 500 mg daily the rest of the week, new change since 07/12/13  40 capsule  3  . levothyroxine (SYNTHROID, LEVOTHROID) 112 MCG tablet Take 112 mcg by mouth daily before breakfast.      . nebivolol (BYSTOLIC) 2.5 MG tablet take 1 tablet by mouth once daily  90 tablet  4  . potassium chloride SA (KLOR-CON M20) 20 MEQ tablet TAKE 1 TABLET BY MOUTH DAILY  30 tablet  6  . traMADol (ULTRAM) 50 MG tablet Take 1 tablet (50 mg total) by mouth at bedtime as needed.  30 tablet  0  . XARELTO 15 MG TABS tablet Take 15 mg by mouth daily.       No current facility-administered medications for this visit.    PHYSICAL EXAMINATION: ECOG PERFORMANCE STATUS: 0 - Asymptomatic  Filed Vitals:   10/11/13 1316  BP: 152/53  Pulse: 52  Temp: 97.8 F (36.6 C)  Resp: 18   Filed Weights   10/11/13 1316  Weight: 125 lb 14.4 oz (57.108 kg)    GENERAL:alert, no distress and comfortable SKIN: skin color, texture, turgor are normal, no rashes or significant lesions. Palpable lipoma on the right neck EYES: normal, Conjunctiva are pink and non-injected, sclera clear OROPHARYNX:no exudate, no erythema and lips, buccal mucosa, and tongue normal  NECK: supple, thyroid normal size, non-tender, without nodularity LYMPH:  no palpable lymphadenopathy in the cervical, axillary or inguinal LUNGS: clear to auscultation and  percussion with normal breathing effort HEART: regular rate & rhythm and no murmurs and no lower extremity edema ABDOMEN:abdomen soft, non-tender and normal bowel sounds Musculoskeletal:no cyanosis of digits and no clubbing  NEURO: alert & oriented x 3 with fluent speech, no focal motor/sensory deficits  LABORATORY  DATA:  I have reviewed the data as listed    Component Value Date/Time   NA 139 07/20/2013 1242   K 4.1 07/20/2013 1242   CL 100 07/20/2013 1242   CO2 32 07/20/2013 1242   GLUCOSE 86 07/20/2013 1242   BUN 19 07/20/2013 1242   CREATININE 0.9 07/20/2013 1242   CREATININE 1.33* 02/08/2012 1624   CALCIUM 11.1* 07/20/2013 1242   PROT 6.7 07/20/2013 1242   ALBUMIN 4.4 07/20/2013 1242   AST 18 07/20/2013 1242   ALT 20 07/20/2013 1242   ALKPHOS 53 07/20/2013 1242   BILITOT 0.7 07/20/2013 1242    No results found for this basename: SPEP, UPEP,  kappa and lambda light chains    Lab Results  Component Value Date   WBC 7.8 10/11/2013   NEUTROABS 5.7 10/11/2013   HGB 12.2 10/11/2013   HCT 37.5 10/11/2013   MCV 115.8* 10/11/2013   PLT 260 10/11/2013      Chemistry      Component Value Date/Time   NA 139 07/20/2013 1242   K 4.1 07/20/2013 1242   CL 100 07/20/2013 1242   CO2 32 07/20/2013 1242   BUN 19 07/20/2013 1242   CREATININE 0.9 07/20/2013 1242   CREATININE 1.33* 02/08/2012 1624      Component Value Date/Time   CALCIUM 11.1* 07/20/2013 1242   ALKPHOS 53 07/20/2013 1242   AST 18 07/20/2013 1242   ALT 20 07/20/2013 1242   BILITOT 0.7 07/20/2013 1242      ASSESSMENT & PLAN:  Polycythemia vera(238.4) The blood shows she has progressive mild leukopenia and intermittent anemia. I recommend she reduce her prescription to 500 mg daily every day except 1 day a week she takes 2 tablets. She tolerated treatment very well. The patient will continue on her anticoagulation therapy to prevent risk of thrombosis. I will lengthen her visit to 6 months now    Chronic diastolic CHF (congestive heart failure) Examination today revealed that she is rate controlled. Clinically, she is euvolemic. She will continue on anticoagulation therapy. She denies bleeding complications.    HTN (hypertension) Her blood pressure is a little high. I suspect that might be an element of anxiety. I would defer to her primary care  provider for medication adjustment.  Neck mass CT scan dated 06/23/2012 confirmed mass consistent with lipoma. No further investigation is needed.    she is noted to have recent hypercalcemia. I would defer to her doctor for management. We discussed the importance of preventive care and reviewed the vaccination programs. She does not have any prior allergic reactions to influenza vaccination. She agrees to proceed with influenza vaccination today and we will administer it today at the clinic.   Orders Placed This Encounter  Procedures  . CBC with Differential    Standing Status: Future     Number of Occurrences:      Standing Expiration Date: 11/15/2014   All questions were answered. The patient knows to call the clinic with any problems, questions or concerns. No barriers to learning was detected. I spent 25 minutes counseling the patient face to face. The total time spent in the appointment was 30 minutes and more than 50% was on counseling  and review of test results     [facility], Verlie Hellenbrand, MD 10/11/2013 8:58 PM

## 2013-10-11 NOTE — Assessment & Plan Note (Signed)
Her blood pressure is a little high. I suspect that might be an element of anxiety. I would defer to her primary care provider for medication adjustment.

## 2013-10-11 NOTE — Assessment & Plan Note (Signed)
CT scan dated 06/23/2012 confirmed mass consistent with lipoma. No further investigation is needed.

## 2013-10-11 NOTE — Assessment & Plan Note (Signed)
The blood shows she has progressive mild leukopenia and intermittent anemia. I recommend she reduce her prescription to 500 mg daily every day except 1 day a week she takes 2 tablets. She tolerated treatment very well. The patient will continue on her anticoagulation therapy to prevent risk of thrombosis. I will lengthen her visit to 6 months now

## 2013-10-11 NOTE — Assessment & Plan Note (Signed)
Examination today revealed that she is rate controlled. Clinically, she is euvolemic. She will continue on anticoagulation therapy. She denies bleeding complications.

## 2013-10-12 ENCOUNTER — Telehealth: Payer: Self-pay | Admitting: Hematology and Oncology

## 2013-10-12 NOTE — Telephone Encounter (Signed)
s.w. pt husband and advised on March 2016 appt....pt ok and aware

## 2013-11-13 DIAGNOSIS — H3531 Nonexudative age-related macular degeneration: Secondary | ICD-10-CM | POA: Diagnosis not present

## 2013-11-13 DIAGNOSIS — H3532 Exudative age-related macular degeneration: Secondary | ICD-10-CM | POA: Diagnosis not present

## 2013-11-15 DIAGNOSIS — H3532 Exudative age-related macular degeneration: Secondary | ICD-10-CM | POA: Diagnosis not present

## 2013-11-15 DIAGNOSIS — H35051 Retinal neovascularization, unspecified, right eye: Secondary | ICD-10-CM | POA: Diagnosis not present

## 2013-11-19 ENCOUNTER — Encounter: Payer: Self-pay | Admitting: Family Medicine

## 2013-11-19 ENCOUNTER — Ambulatory Visit (INDEPENDENT_AMBULATORY_CARE_PROVIDER_SITE_OTHER): Payer: Medicare Other | Admitting: Family Medicine

## 2013-11-19 VITALS — BP 92/58 | HR 60 | Temp 98.3°F | Wt 126.0 lb

## 2013-11-19 DIAGNOSIS — R519 Headache, unspecified: Secondary | ICD-10-CM

## 2013-11-19 DIAGNOSIS — R51 Headache: Secondary | ICD-10-CM

## 2013-11-19 DIAGNOSIS — E038 Other specified hypothyroidism: Secondary | ICD-10-CM

## 2013-11-19 DIAGNOSIS — E034 Atrophy of thyroid (acquired): Secondary | ICD-10-CM

## 2013-11-19 DIAGNOSIS — E039 Hypothyroidism, unspecified: Secondary | ICD-10-CM | POA: Insufficient documentation

## 2013-11-19 LAB — POCT URINALYSIS DIPSTICK
BILIRUBIN UA: NEGATIVE
GLUCOSE UA: NEGATIVE
Ketones, UA: NEGATIVE
Leukocytes, UA: NEGATIVE
Nitrite, UA: NEGATIVE
PH UA: 6
RBC UA: NEGATIVE
Spec Grav, UA: <=1.005
UROBILINOGEN UA: 0.2

## 2013-11-19 LAB — COMPREHENSIVE METABOLIC PANEL
ALK PHOS: 59 U/L (ref 39–117)
ALT: 13 U/L (ref 0–35)
AST: 16 U/L (ref 0–37)
Albumin: 4.1 g/dL (ref 3.5–5.2)
BILIRUBIN TOTAL: 0.9 mg/dL (ref 0.2–1.2)
BUN: 26 mg/dL — AB (ref 6–23)
CO2: 23 meq/L (ref 19–32)
Calcium: 10.4 mg/dL (ref 8.4–10.5)
Chloride: 98 mEq/L (ref 96–112)
Creatinine, Ser: 1.1 mg/dL (ref 0.4–1.2)
GFR: 50.1 mL/min — AB (ref >60.00)
Glucose, Bld: 89 mg/dL (ref 70–99)
Potassium: 3.6 mEq/L (ref 3.5–5.1)
Sodium: 133 mEq/L — ABNORMAL LOW (ref 135–145)
TOTAL PROTEIN: 7.2 g/dL (ref 6.0–8.3)

## 2013-11-19 LAB — CBC
HEMATOCRIT: 37.8 % (ref 36.0–46.0)
HEMOGLOBIN: 12.4 g/dL (ref 12.0–15.0)
MCHC: 32.9 g/dL (ref 30.0–36.0)
PLATELETS: 269 10*3/uL (ref 150.0–400.0)
RBC: 3.32 Mil/uL — AB (ref 3.87–5.11)
RDW: 15.9 % — ABNORMAL HIGH (ref 11.5–15.5)
WBC: 6.7 10*3/uL (ref 4.0–10.5)

## 2013-11-19 LAB — TSH: TSH: 0.87 u[IU]/mL (ref 0.35–4.50)

## 2013-11-19 NOTE — Patient Instructions (Signed)
Check Labs today.   I wonder if how you are feeling is related to your blood pressure. Do not take hydralazine tonight. Take 1 pill instead of 1 and 1/2 tomorrow morning.   Seek care if you develop worsening headache, dizziness, confusion by going to emergency room (do not drive).   Wonderful to meet you.   Please see me tomorrow so we can check back in on you and your blood pressure.

## 2013-11-19 NOTE — Progress Notes (Signed)
Sarah Reddish, MD Phone: [phone]  Subjective:  Patient presents today to establish care as new patient to practice. She is having a current concern of a headache though. . Chief complaint-noted.   Headache Frontal. No radiation. Started 2 days ago. 7-8/10 at peak. When she takes tylenol gets down to 1-2/10. Usually does not get headaches but does sporadically get them and more frequently if has some illness such as UTI or other. She states she felt slightly dizzy this AM and continues to if she stands up from chair. She states she feels slightly confused when she does this. No other treatments tried.  ROS- no chest pain, shortness of breath (other than very slight amount this AM which has since resolved), blurry vision, extremity weakness, diaphoresis. Does have polyuria on lasix but without recent increase. Does admit to poor sleep but issue fo ryears.   The following were reviewed and entered/updated in epic: Past Medical History  Diagnosis Date  . Atrial fibrillation   . Hypertension   . Mitral valve disorders   . Tricuspid valve disorders, specified as nonrheumatic   . Diastolic dysfunction   . Shortness of breath   . Peripheral edema   . Neck mass     right, w/u including MRI negative  . Hypothyroidism   . Macular degeneration     Left, s/p inj. tx  . Senile osteoporosis     Reclast in the past  . Alopecia, unspecified   . Pure hypercholesterolemia   . Congestive heart failure, unspecified   . Headache(784.0)   . Backache, unspecified   . Polycythemia vera(238.4)   . Gouty arthropathy, unspecified   . Insomnia, unspecified   . Other malaise and fatigue   . Hyposmolality and/or hyponatremia   . Hyperpotassemia   . Edema   . Closed fracture of lower end of radius with ulna   . Hypercalcemia   . Deafness     Left, s/p multiple surgeries   Patient Active Problem List   Diagnosis Date Noted  . Hypothyroidism 11/19/2013  . Hypercalcemia 10/11/2013  . Insomnia  05/16/2013  . Back pain 05/16/2013  . Polycythemia vera(238.4)   . Neck mass   . Senile osteoporosis   . Macular degeneration   . Bradycardia 03/09/2012  . AKI (acute kidney injury) 12/27/2011  . CAD (coronary artery disease) 12/27/2011  . Atrial fibrillation 12/27/2011  . Chronic diastolic CHF (congestive heart failure) 12/27/2011  . HTN (hypertension) 12/27/2011   Past Surgical History  Procedure Laterality Date  . Breast biopsy  06/18/1998    left  . Total abdominal hysterectomy    . Tonsillectomy    . Breast biopsy      Family History  Problem Relation Age of Onset  . Hypertension Father   . Heart disease Father   . Breast cancer Daughter   . Cancer Daughter     breast  . Ovarian cancer Mother 1  . Cancer Sister     colon  . Heart disease Sister   . Cancer Sister   . Cancer Sister     hodgkin disease    Medications- reviewed and updated Current Outpatient Prescriptions  Medication Sig Dispense Refill  . amiodarone (PACERONE) 200 MG tablet Take 1/2 tab (100 mg) by mouth daily.  15 tablet  6  . Cholecalciferol (VITAMIN D3) 400 UNITS CAPS Take 1 capsule by mouth daily.      . furosemide (LASIX) 40 MG tablet TAKE 1 TABLET TWICE DAILY.  60 tablet  5  . GLUCOSAMINE-CHONDROITIN DS PO Take by mouth.      . hydrALAZINE (APRESOLINE) 50 MG tablet TAKE 1 1/2 TABLETS THREE TIMES DAILY.  135 tablet  3  . hydroxyurea (HYDREA) 500 MG capsule Two capsules (1000 mg) on Mondays and 500 mg daily the rest of the week, new change since 07/12/13  40 capsule  3  . ibandronate (BONIVA) 150 MG tablet Take 150 mg by mouth every 30 (thirty) days. Take in the morning with a full glass of water, on an empty stomach, and do not take anything else by mouth or lie down for the next 30 min.      [name] Kitchen levothyroxine (SYNTHROID, LEVOTHROID) 112 MCG tablet Take 112 mcg by mouth daily before breakfast.      . nebivolol (BYSTOLIC) 2.5 MG tablet take 1 tablet by mouth once daily  90 tablet  4  . potassium  chloride SA (KLOR-CON M20) 20 MEQ tablet TAKE 1 TABLET BY MOUTH DAILY  30 tablet  6  . XARELTO 15 MG TABS tablet Take 15 mg by mouth daily.      . traMADol (ULTRAM) 50 MG tablet Take 1 tablet (50 mg total) by mouth at bedtime as needed.  30 tablet  0   No current facility-administered medications for this visit.    Allergies-reviewed and updated No Known Allergies  History   Social History  . Marital Status: Married    Spouse Name: [name]    Number of Children: 2  . Years of Education: 16   Social History Main Topics  . Smoking status: Former Smoker    Types: Cigarettes    Quit date: 01/19/1984  . Smokeless tobacco: Never Used  . Alcohol Use: 0.6 oz/week    1 Glasses of wine per week     Comment: 2 per day/ 14 a week  . Drug Use: No  . Sexual Activity: None   Other Topics Concern  . None   Social History Narrative   Patient is Married. College graduate [name]. Of [place]). Lives in apartment,  Independent Living  section at [facility] since 02/2013.   No Smoking history, Mod. alcohol use.   Patient has  no Advanced planning documents          ROS--See HPI , otherwise full ROS was completed and negative except as noted above  Objective: BP 92/58  Pulse 60  Temp(Src) 98.3 F (36.8 C)  Wt 126 lb (57.153 kg) Gen: NAD, resting comfortably in chair, needs help getting to chair due to R knee pain (arthritis) HEENT: nasal turbinates normal, oropharynx normal without pharyngeal exudate, good dentition, TM normal bilaterally, Mucous membranes are moist. NCAT. PERRLA.  Neck: no thyromegaly or lymphadenopathy CV: irregularly irregular no mrg  Lungs: CTAB, no wheeze, rale, rhonchi Abd: soft/nontender/nondistended/normal bowel sounds  MSK: moves all extremities, no edema  Skin: warm and dry, no rash Neuro: CN II-XII intact, sensation and reflexes normal throughout, 5/5 muscle strength in bilateral upper and lower extremities. Normal finger to nose. Normal  rapid alternating movements.   Results for orders placed in visit on 11/19/13 (from the past 24 hour(s))  POCT URINALYSIS DIPSTICK     Status: None   Collection Time    11/19/13  3:02 PM      Result Value Ref Range   Color, UA yellow     Clarity, UA clear     Glucose, UA n     Bilirubin, UA n     Ketones,  UA n     Spec Grav, UA <=1.005     Blood, UA n     pH, UA 6.0     Protein, UA trace     Urobilinogen, UA 0.2     Nitrite, UA n     Leukocytes, UA Negative      Assessment/Plan:  Headache/orthostatic symptoms with standing Patient with normal neurological exam and occasional intermittent headaches when ill in past. I doubt CVA as dizziness only with standing and no vertiginous symptoms. Doubt cardiac such as MI given no chest pain or current shortness of breath. She is in atrial fibrillation on exam but not with RVR and doubt this as cause. Patient's BP is 92/58 so have asked her to hold her hydralazine this evening and take 50mg  instead of 75 mg tomorrow AM then return tomorrow. Check labs as below. Urine does not appear infectious as above.   Return precautions advised including need to call 911 if symptoms worsen or new symptoms appear.   Orders Placed This Encounter  Procedures  . CBC    [place]  . Comprehensive metabolic panel    [place]  . TSH    [place]  . POCT urinalysis dipstick

## 2013-11-20 ENCOUNTER — Telehealth: Payer: Self-pay | Admitting: Family Medicine

## 2013-11-20 NOTE — Telephone Encounter (Signed)
Dr. Yong Channel please advise. Pt is coming in tomorrow morning(FYI)

## 2013-11-20 NOTE — Telephone Encounter (Signed)
Patient Information:  Caller Name: Khaliyah  Phone: [phone]  Patient: Sarah Mays, Sarah Mays  Gender: Female  DOB: August 08, 1930  Age: 80 Years  PCP: Garret Reddish  Office Follow Up:  Does the office need to follow up with this patient?: Yes  Instructions For The Office: Office please call pt back regarding Apresoline directions and re check on BP since MD noted in office visit on 11/19/13 that he wanted to see her today. (Pt states that office called her today and stated that MD has no appt for today.)  RN Note:  Triage RN noted in EPIC that Dr Yong Channel noted the medication change as noted per pt but he documented that pt was to see him today.  Pt states office called her today and said that he has no appt for today for her to be seen. Pt needs directions on how to continue the Apresoline.  Pt also wants to know if she can stop by the office to have her BP checked.  OFFICE PLEASE REVIEW AND CALL PT Maish Vaya FURTHER INSTRUCTIONS  Symptoms  Reason For Call & Symptoms: Pt states that she was seen by Dr Yong Channel on 11/19/2013 and was instructed not to take her BP medication last night on 11/19/2013 due to her BP running low;  then instructed to take 1 Apresoline tablet instead of 1 1/2 tablets this morning;  but caller is unsure how she is to take the Apresoline for the rest of today since she typically takes 1 1/2 tabs 3 times daily;  unable to check BP at home;  states that she is feeling better today;  denies any sx  Reviewed Health History In EMR: Yes  Reviewed Medications In EMR: Yes  Reviewed Allergies In EMR: Yes  Reviewed Surgeries / Procedures: Yes  Date of Onset of Symptoms: Unknown  Guideline(s) Used:  No Protocol Available - Information Only  Disposition Per Guideline:   Discuss with PCP and Callback by Nurse Today  Reason For Disposition Reached:   Nursing judgment  Advice Given:  Call Back If:  New symptoms develop  You become worse.  Patient Will Follow Care Advice:   YES

## 2013-11-20 NOTE — Telephone Encounter (Signed)
No more headaches or dizziness or confused feeling with standing.  Patient feels tired but overall feels much better.  Asked her to take 1 pill of hydralazine TID for now and see me back tomorrow morning for BP check.

## 2013-11-20 NOTE — Telephone Encounter (Signed)
Pt following up on med request. pls advise

## 2013-11-21 ENCOUNTER — Encounter: Payer: Self-pay | Admitting: Family Medicine

## 2013-11-21 ENCOUNTER — Ambulatory Visit (INDEPENDENT_AMBULATORY_CARE_PROVIDER_SITE_OTHER): Payer: Medicare Other | Admitting: Family Medicine

## 2013-11-21 VITALS — BP 122/60 | Temp 97.9°F | Wt 126.0 lb

## 2013-11-21 DIAGNOSIS — I10 Essential (primary) hypertension: Secondary | ICD-10-CM

## 2013-11-21 NOTE — Assessment & Plan Note (Signed)
Blood pressure is well controlled without any hypotension today with Lasix 40 mg twice a day, Bystolic 2.5 mg, hydralazine 50 mg 3 times a day which is a decrease and 75 mg 3 times a day. Symptoms of headache, dizziness, confusion with standing have resolved. Patient to have blood pressure checked at [facility] regularly as per AVS. She will followup with me in 4 months

## 2013-11-21 NOTE — Patient Instructions (Signed)
Seems like your symptoms were coming from low blood pressure  Continue apresoline 1 pill three times a day instead of 1.5 pills.   Have [facility] check your blood pressure twice a week for 2 weeks then 1 once a week for 2 weeks then stop if things are ok.   #s I want you to inform me for:  Top number <100 or >140  Bottom number <55 or >90.   Otherwise see me in 4 months. I am looking forward to working with you.  Dr. Yong Channel

## 2013-11-21 NOTE — Progress Notes (Signed)
Garret Reddish, MD Phone: [phone]  Subjective:   Sarah Mays is a 80 y.o. year old very pleasant female patient who presents with the following:  Hypertension/hypotension 2 days ago patient presented with headache, dizziness, mild confusion with standing and blood pressure noted to be in the 46N systolic. Normal neurological exam at that time. Had patient reduce hydralazine to 50 mg 3 times a day from 75 mg 3 times a day and check labs. Labs largely are unremarkable for her except mild hyponatremia.  Today, patient admits all of her symptoms resolved. She feels much much better. She's been compliant with hydralazine 1 mg 3 times a day along with other blood pressure medications ROS-no headache, dizziness, confusion. No extremity weakness.  Past Medical History- Patient Active Problem List   Diagnosis Date Noted  . Polycythemia vera     Priority: High  . CAD (coronary artery disease) 12/27/2011    Priority: High  . Atrial fibrillation 12/27/2011    Priority: High  . Chronic diastolic CHF (congestive heart failure) 12/27/2011    Priority: High  . Hypothyroidism 11/19/2013    Priority: Medium  . Hypercalcemia 10/11/2013    Priority: Medium  . Insomnia 05/16/2013    Priority: Medium  . Senile osteoporosis     Priority: Medium  . Macular degeneration     Priority: Medium  . HTN (hypertension) 12/27/2011    Priority: Medium  . Back pain 05/16/2013    Priority: Low  . Neck mass     Priority: Low  . Bradycardia 03/09/2012    Priority: Low  . AKI (acute kidney injury) 12/27/2011    Priority: Low   Medications- reviewed and updated Current Outpatient Prescriptions  Medication Sig Dispense Refill  . amiodarone (PACERONE) 200 MG tablet Take 1/2 tab (100 mg) by mouth daily.  15 tablet  6  . Cholecalciferol (VITAMIN D3) 400 UNITS CAPS Take 1 capsule by mouth daily.      . furosemide (LASIX) 40 MG tablet TAKE 1 TABLET TWICE DAILY.  60 tablet  5  .  GLUCOSAMINE-CHONDROITIN DS PO Take by mouth.      . hydrALAZINE (APRESOLINE) 50 MG tablet TAKE 1 TABLET THREE TIMES DAILY.      . hydroxyurea (HYDREA) 500 MG capsule Two capsules (1000 mg) on Mondays and 500 mg daily the rest of the week, new change since 07/12/13  40 capsule  3  . ibandronate (BONIVA) 150 MG tablet Take 150 mg by mouth every 30 (thirty) days. Take in the morning with a full glass of water, on an empty stomach, and do not take anything else by mouth or lie down for the next 30 min.      [name] Kitchen levothyroxine (SYNTHROID, LEVOTHROID) 112 MCG tablet Take 112 mcg by mouth daily before breakfast.      . nebivolol (BYSTOLIC) 2.5 MG tablet take 1 tablet by mouth once daily  90 tablet  4  . potassium chloride SA (KLOR-CON M20) 20 MEQ tablet TAKE 1 TABLET BY MOUTH DAILY  30 tablet  6  . XARELTO 15 MG TABS tablet Take 15 mg by mouth daily.      . traMADol (ULTRAM) 50 MG tablet Take 1 tablet (50 mg total) by mouth at bedtime as needed.  30 tablet  0   No current facility-administered medications for this visit.    Objective: BP 122/60  Temp(Src) 97.9 F (36.6 C)  Wt 126 lb (57.153 kg) Gen: NAD, resting comfortably in chair, no lightheadedness with standing  as last time CV: RRR no murmurs rubs or gallops Lungs: CTAB no crackles, wheeze, rhonchi Abdomen: soft/nontender/nondistended/normal bowel sounds.  Ext: no edema Skin: warm, dry, no rash Neuro: grossly normal, moves all extremities, still a+0 x 4.   Assessment/Plan:  HTN (hypertension) Hypotension Blood pressure is well controlled without any hypotension today with Lasix 40 mg twice a day, Bystolic 2.5 mg, hydralazine 50 mg 3 times a day which is a decrease and 75 mg 3 times a day. Symptoms of headache, dizziness, confusion with standing have resolved. Patient to have blood pressure checked at [facility] regularly as per AVS. She will followup with me in 4 months. Return precautions advised for sooner care  Meds ordered this encounter   Medications  . hydrALAZINE (APRESOLINE) 50 MG tablet    Sig: TAKE 1 TABLET THREE TIMES DAILY.

## 2013-11-27 ENCOUNTER — Telehealth: Payer: Self-pay | Admitting: Family Medicine

## 2013-11-27 NOTE — Telephone Encounter (Signed)
Im guessing this is just an FYI Dr. Yong Channel

## 2013-11-27 NOTE — Telephone Encounter (Signed)
Pt states her bp today was 152/74. Pt states dr hunter adjusted bp meds and this is todays results.

## 2013-11-27 NOTE — Telephone Encounter (Signed)
Have patient send me her next 2 BPs as well. No changes today to meds.

## 2013-11-28 ENCOUNTER — Encounter: Payer: Self-pay | Admitting: Family Medicine

## 2013-11-28 ENCOUNTER — Ambulatory Visit (INDEPENDENT_AMBULATORY_CARE_PROVIDER_SITE_OTHER): Payer: Medicare Other | Admitting: Family Medicine

## 2013-11-28 VITALS — BP 140/72 | HR 50 | Wt 131.5 lb

## 2013-11-28 DIAGNOSIS — R5383 Other fatigue: Secondary | ICD-10-CM

## 2013-11-28 DIAGNOSIS — R51 Headache: Secondary | ICD-10-CM

## 2013-11-28 DIAGNOSIS — R42 Dizziness and giddiness: Secondary | ICD-10-CM

## 2013-11-28 DIAGNOSIS — I5032 Chronic diastolic (congestive) heart failure: Secondary | ICD-10-CM

## 2013-11-28 DIAGNOSIS — R519 Headache, unspecified: Secondary | ICD-10-CM

## 2013-11-28 NOTE — Progress Notes (Signed)
Garret Reddish, MD Phone: [phone]  Subjective:   MAREESA Mays is a 80 y.o. year old very pleasant female patient who presents with the following:  Fatigue Lightheadedness Started 2 days ago as lightheadedness/dizzines. Patient checked blood pressure at home and 152/74 yesterday and wondered if blood pressure related. Seems to be worse with standing up and walking. Does not feel like room spinning. Feels like might fall if doesn't hold onto something. Headaches over last 2 days as well that are relieved by tylenol. Does admit to mild dyspnea on exertion but no shortness of breath at rest. Patient compliant with 40mg  lasix BID and other medications.  ROS-  no orthopnea, no PND. No palpitations, no chest pain. No cough/congestion/fevers/body aches. No tinnitus.   CMET, bnp Check weight  orthostatics  Past Medical History- Patient Active Problem List   Diagnosis Date Noted  . Polycythemia vera     Priority: High  . CAD (coronary artery disease) 12/27/2011    Priority: High  . Atrial fibrillation 12/27/2011    Priority: High  . Chronic diastolic CHF (congestive heart failure) 12/27/2011    Priority: High  . Hypothyroidism 11/19/2013    Priority: Medium  . Hypercalcemia 10/11/2013    Priority: Medium  . Insomnia 05/16/2013    Priority: Medium  . Senile osteoporosis     Priority: Medium  . Macular degeneration     Priority: Medium  . HTN (hypertension) 12/27/2011    Priority: Medium  . Back pain 05/16/2013    Priority: Low  . Neck mass     Priority: Low  . Bradycardia 03/09/2012    Priority: Low  . AKI (acute kidney injury) 12/27/2011    Priority: Low   Medications- reviewed and updated Current Outpatient Prescriptions  Medication Sig Dispense Refill  . amiodarone (PACERONE) 200 MG tablet Take 1/2 tab (100 mg) by mouth daily.  15 tablet  6  . Cholecalciferol (VITAMIN D3) 400 UNITS CAPS Take 1 capsule by mouth daily.      . furosemide (LASIX) 40 MG tablet TAKE  1 TABLET TWICE DAILY.  60 tablet  5  . GLUCOSAMINE-CHONDROITIN DS PO Take by mouth.      . hydrALAZINE (APRESOLINE) 50 MG tablet TAKE 1 TABLET THREE TIMES DAILY.      . hydroxyurea (HYDREA) 500 MG capsule Two capsules (1000 mg) on Mondays and 500 mg daily the rest of the week, new change since 07/12/13  40 capsule  3  . ibandronate (BONIVA) 150 MG tablet Take 150 mg by mouth every 30 (thirty) days. Take in the morning with a full glass of water, on an empty stomach, and do not take anything else by mouth or lie down for the next 30 min.      [name] Kitchen levothyroxine (SYNTHROID, LEVOTHROID) 112 MCG tablet Take 112 mcg by mouth daily before breakfast.      . nebivolol (BYSTOLIC) 2.5 MG tablet take 1 tablet by mouth once daily  90 tablet  4  . potassium chloride SA (KLOR-CON M20) 20 MEQ tablet TAKE 1 TABLET BY MOUTH DAILY  30 tablet  6  . traMADol (ULTRAM) 50 MG tablet Take 1 tablet (50 mg total) by mouth at bedtime as needed.  30 tablet  0  . XARELTO 15 MG TABS tablet Take 15 mg by mouth daily.       No current facility-administered medications for this visit.    Objective: BP 140/72  Pulse 50  Wt 131 lb 8 oz (59.648 kg) Cardiac  Pertinent positives Weight increased 5 lbs from last visit JVD noted Trace edema  Cardiac Pertinent negatives Lungs clear to auscultation Orthostatic BP Negative (flowsheet)  Gen: NAD, resting comfortably in chair, assist needed to get on table due to lightheadedness NCAT. PERRLA.  CV: irregularly irregular no mrg  (see above) Lungs: CTAB, no wheeze, rale, rhonchi Abd: soft/nontender/nondistended/normal bowel sounds  MSK: moves all extremities Skin: warm and dry, no rash Normal Neuro exam: CN II-XII intact (decreased hearing at baseline), sensation and reflexes normal throughout, 5/5 muscle strength in bilateral upper and lower extremities. Normal finger to nose. Normal rapid alternating movements. Normal romberg when able to get patient to keep eyes closed. No pronator  drift.    Assessment/Plan:  Fatigue Lightheadedness Mild Headaches Orthostatics negative-last time when patient had these symptoms her BP was noted to be low but no longer. Did have mild hyponatremia last visit so repeat.   Patient's weight being up and JVD and mild DOE make me concerned about her chronic diastolic CHF. She is to continue her lasix but if BNP elevated, may have her take an extra dose tomorrow. Strict return precautions given in regards to CHF. Daughter who is an Therapist, sports (and Software engineer of [employer])and present at visit to assist. Discussed doing EKG vs. Getting patient in for cardiology follow up and was able to get appointment with Dr. Aundra Dubin on Friday so opted for this to be done at cardiology. Bradycardia at this level is pretty normal for patient.   If cards does not think this is cardiology related, plan to see patient back on Monday or Tuesday to discuss symptoms and consider CT head for possible TIA though think low likelihood on xarelto and with BP controlled.    Orders Placed This Encounter  Procedures  . Comprehensive metabolic panel    [place]  . Pro b natriuretic peptide

## 2013-11-28 NOTE — Patient Instructions (Signed)
Check labs today  As long as symptoms are stable, we will make a plan by phone until you see Dr. Kirk Ruths on Friday. If symptoms were to worsen, please seek care immediately.   We may have you take an extra lasix tomorrow (8 am, 2 pm, 8 pm) before seeing Dr. Kirk Ruths

## 2013-11-28 NOTE — Telephone Encounter (Signed)
Pt coming in today

## 2013-11-29 LAB — COMPREHENSIVE METABOLIC PANEL
ALK PHOS: 77 U/L (ref 39–117)
ALT: 28 U/L (ref 0–35)
AST: 26 U/L (ref 0–37)
Albumin: 4 g/dL (ref 3.5–5.2)
BUN: 15 mg/dL (ref 6–23)
CO2: 30 mEq/L (ref 19–32)
CREATININE: 0.9 mg/dL (ref 0.4–1.2)
Calcium: 10.2 mg/dL (ref 8.4–10.5)
Chloride: 96 mEq/L (ref 96–112)
GFR: 60.69 mL/min (ref >60.00)
Glucose, Bld: 78 mg/dL (ref 70–99)
POTASSIUM: 5.1 meq/L (ref 3.5–5.1)
Sodium: 134 mEq/L — ABNORMAL LOW (ref 135–145)
Total Bilirubin: 0.8 mg/dL (ref 0.2–1.2)
Total Protein: 6.9 g/dL (ref 6.0–8.3)

## 2013-11-29 LAB — PRO B NATRIURETIC PEPTIDE: Pro B Natriuretic peptide (BNP): 1893 pg/mL — ABNORMAL HIGH (ref <451)

## 2013-11-30 ENCOUNTER — Ambulatory Visit (INDEPENDENT_AMBULATORY_CARE_PROVIDER_SITE_OTHER): Payer: Medicare Other | Admitting: Cardiology

## 2013-11-30 VITALS — BP 160/80 | HR 48 | Ht 62.0 in | Wt 124.0 lb

## 2013-11-30 DIAGNOSIS — R0602 Shortness of breath: Secondary | ICD-10-CM

## 2013-11-30 DIAGNOSIS — I2510 Atherosclerotic heart disease of native coronary artery without angina pectoris: Secondary | ICD-10-CM | POA: Diagnosis not present

## 2013-11-30 DIAGNOSIS — I481 Persistent atrial fibrillation: Secondary | ICD-10-CM

## 2013-11-30 DIAGNOSIS — I4891 Unspecified atrial fibrillation: Secondary | ICD-10-CM | POA: Diagnosis not present

## 2013-11-30 DIAGNOSIS — I5032 Chronic diastolic (congestive) heart failure: Secondary | ICD-10-CM

## 2013-11-30 DIAGNOSIS — R001 Bradycardia, unspecified: Secondary | ICD-10-CM

## 2013-11-30 DIAGNOSIS — I10 Essential (primary) hypertension: Secondary | ICD-10-CM

## 2013-11-30 DIAGNOSIS — I4819 Other persistent atrial fibrillation: Secondary | ICD-10-CM

## 2013-11-30 LAB — BASIC METABOLIC PANEL
BUN: 21 mg/dL (ref 6–23)
CALCIUM: 10.9 mg/dL — AB (ref 8.4–10.5)
CO2: 29 mEq/L (ref 19–32)
Chloride: 98 mEq/L (ref 96–112)
Creatinine, Ser: 1 mg/dL (ref 0.4–1.2)
GFR: 55.87 mL/min — AB (ref >60.00)
Glucose, Bld: 84 mg/dL (ref 70–99)
POTASSIUM: 4.3 meq/L (ref 3.5–5.1)
SODIUM: 137 meq/L (ref 135–145)

## 2013-11-30 LAB — BRAIN NATRIURETIC PEPTIDE: PRO B NATRI PEPTIDE: 399 pg/mL — AB (ref 0.0–100.0)

## 2013-11-30 MED ORDER — FUROSEMIDE 40 MG PO TABS
60.0000 mg | ORAL_TABLET | Freq: Two times a day (BID) | ORAL | Status: DC
Start: 2013-11-30 — End: 2014-07-19

## 2013-11-30 MED ORDER — HYDRALAZINE HCL 50 MG PO TABS: 75.0000 mg | ORAL_TABLET | Freq: Three times a day (TID) | ORAL | Status: DC

## 2013-11-30 NOTE — Patient Instructions (Addendum)
Please increase your Lasix to 60 mg (1 and 1/2 tablets) twice a day.  Increase your Hydralazine to 75 mg three times a day.  Stop your Nebivolol. Continue all other medications as listed.  Please have blood work today (BNP, BMP)  Repeat lab in 10 days (BMP and BNP)  Your physician has requested that you have an echocardiogram. Echocardiography is a painless test that uses sound waves to create images of your heart. It provides your doctor with information about the size and shape of your heart and how well your heart's chambers and valves are working. This procedure takes approximately one hour. There are no restrictions for this procedure.  Your physician has recommended that you wear a holter monitor. Holter monitors are medical devices that record the heart's electrical activity. Doctors most often use these monitors to diagnose arrhythmias. Arrhythmias are problems with the speed or rhythm of the heartbeat. The monitor is a small, portable device. You can wear one while you do your normal daily activities. This is usually used to diagnose what is causing palpitations/syncope (passing out).  Follow up with Dr Aundra Dubin in 2 weeks. (OK to double book per DM)

## 2013-12-02 ENCOUNTER — Encounter: Payer: Self-pay | Admitting: Cardiology

## 2013-12-02 NOTE — Progress Notes (Signed)
Patient ID: Sarah Mays, female   DOB: Apr 18, 1930, 80 y.o.   MRN: [id number] PCP: Dr. Yong Channel  80 yo with paroxysmal atrial fibrillation and chronic diastolic CHF returns for cardiology evaluation.  She remains in NSR today with no tachypalpitations.  Weight is up 4 lbs.  Her HR has been slow recently, in the 40s-50s.  She was seen by her PCP recently and described dizzy/lightheaded spells. No syncope. SBP was in the 90s.  He decreased her hydralazine to 50 mg tid.  Today, BP is 160/80.  She has had mild dyspnea riding her exercise bike for several months.  Moderate to heavy exertion gets her short of breath.    ECG: NSR, PACs, nonspecific T wave flattening  Labs (7/13): K 4.7, creatinine 0.56 Labs (8/13): K 5.4, creatinine 0.69 Labs (11/13): TSH 6.45, K 5.4=>3.8, creatinine 1.5=>1.2, BUN 70=>35, HCT 33.2, BNP 866, AST 25, ALT 36 Labs (12/13): K 3.5 => 2.8, creatinine 1.2 => 1.33, BUN 56 Labs (1/14): LDL 85, HDL 86, K 3.9, creatinine 1.0, BNP 447 Labs (4/14): K 4.1, creatinine 0.9, BNP 293, LFTs normal, TSH normal Labs (8/14): K 4.3, creatinine 1.2, LFTs normal, TSH normal Labs (12/14): LFTs normal, TSH normal Labs (3/15): K 4.5, creatinine 1.3, HCT 36.2 Labs (10/15): K 5.1, creatinine 0.9, LFTs normal, TSH normal, pBNP 1893  PMH: 1. Atrial fibrillation: Diagnosed initially in 10/13.  Holter monitor in 11/13 showed atrial fibrillation with average rate 65.  Back in NSR on amiodarone.   2. Chronic diastolic CHF: Echo (76/16) with EF 65-70%, mild MR, moderate biatrial enlargement, moderate TR, PA systolic pressure 45 mmHg.   3. ETT-Sestamibi (11/13): Exercised to stage II, small partially reversible anteroapical perfusion defect suggesting mild ischemia versus attenuation, EF 80%.  4. HTN: Lower extremity swelling with amlodipine.  5. Polycythemia vera: Has had phlebotomy only once.  6. Hypothyroidism 7. Macular degeneration 8. TAH 1980 9. Familial hypocalciuric hypercalcemia 10. PFTs  (amiodarone use) in 1/14 showed a mild obstructive defect.   SH: Prior smoker (years ago).  Married, lived in [place] but now at [facility].  Occasional ETOH.  Daughter works for [employer].   FH: CAD  ROS: All systems reviewed and negative except as per HPI.   Current Outpatient Prescriptions  Medication Sig Dispense Refill  . amiodarone (PACERONE) 200 MG tablet Take 1/2 tab (100 mg) by mouth daily.  15 tablet  6  . Cholecalciferol (VITAMIN D3) 400 UNITS CAPS Take 1 capsule by mouth daily.      . furosemide (LASIX) 40 MG tablet Take 1.5 tablets (60 mg total) by mouth 2 (two) times daily.  90 tablet  6  . GLUCOSAMINE-CHONDROITIN DS PO Take by mouth. Take 1 tab daily      . hydrALAZINE (APRESOLINE) 50 MG tablet Take 1.5 tablets (75 mg total) by mouth 3 (three) times daily.  135 tablet  6  . hydroxyurea (HYDREA) 500 MG capsule Two capsules (1000 mg) on Mondays and 500 mg daily the rest of the week, new change since 07/12/13  40 capsule  3  . ibandronate (BONIVA) 150 MG tablet Take 150 mg by mouth every 30 (thirty) days. Take in the morning with a full glass of water, on an empty stomach, and do not take anything else by mouth or lie down for the next 30 min.      [name] Kitchen levothyroxine (SYNTHROID, LEVOTHROID) 112 MCG tablet Take 112 mcg by mouth daily before breakfast.      . potassium chloride SA (KLOR-CON  M20) 20 MEQ tablet TAKE 1 TABLET BY MOUTH DAILY  30 tablet  6  . traMADol (ULTRAM) 50 MG tablet Take 1 tablet (50 mg total) by mouth at bedtime as needed.  30 tablet  0  . XARELTO 15 MG TABS tablet Take 15 mg by mouth daily.       No current facility-administered medications for this visit.    BP 160/80  Pulse 48  Ht 5\' 2"  (1.575 m)  Wt 124 lb (56.246 kg)  BMI 22.67 kg/m2 General: NAD Neck: JVP 8-9 cm, no thyromegaly or thyroid nodule.  Lungs: Slight crackles at bases bilaterally.  CV: Nondisplaced PMI.  Heart irregular S1/S2, no S3/S4, 1/6 early SEM.  No edema.  No carotid bruit.   Abdomen: Soft, nontender, no hepatosplenomegaly, no distention.  Neurologic: Alert and oriented x 3.  Psych: Normal affect. Extremities: No clubbing or cyanosis.   Assessment/Plan:  1. Atrial fibrillation: First noted in 10/13.  Atrial fibrillation has triggered acute on chronic diastolic CHF in the past.  She is in NSR today.  CHADSVASC score is 77 (age, female gender, HTN, CHF).  She is anticoagulated with Xarelto 15 mg daily.  Given her moderately enlarged atria, I thought that she would need an antiarrhythmic for the best chance to maintain NSR after cardioversion.  She is now on amiodarone at low dose (100 mg daily) and has held NSR.   - Recent LFTs and TSH were normal.  Given amiodarone use, she will need yearly eye exams.  Baseline PFTs showed a mild obstructive defect.  - I am going to continue amiodarone but will stop nebivolol with low HR.  2. CAD: Patient has not had any chest pain.  She has had exertional dyspnea that seems to date from around the likely onset of her atrial fibrillation.  She had an ETT-Sestamibi in 11/13 that showed a small partially reversible anteroapical perfusion defect.  This may be a small area of ischemia but soft tissue attenuation is certainly also possible.  This does not sound like a high risk study.  Would continue to manage medically.  3. Chronic diastolic CHF: She has developed increased exertional dyspnea recently with elevated BNP and JVP today.  Weight is higher.   - Increase Lasix to 60 mg bid.  BMET/BNP in 10 days and followup in 2 wks.   - Repeat echo.  4. HTN: BP is higher now that hydralazine was cut back with hypertension.  As I am stopping nebivolol today, will increase hydralazine back to 75 mg tid.  5. Bradycardia: HR in 40s-50s.  I will stop nebivolol and get a 24 hour holter monitor to make sure that she does not having concerning bradycardic events.   Loralie Champagne 12/02/2013

## 2013-12-04 ENCOUNTER — Telehealth: Payer: Self-pay | Admitting: Cardiology

## 2013-12-04 NOTE — Telephone Encounter (Signed)
Patient explains that she is concerned because her HR is 103, and Friday it was 21. She c/o of headache and dizziness (which has been occurring on and off for 2 weeks, per patient). After reviewing OV from Friday with Dr. Aundra Dubin this could be AFib issue.  I discussed with patient what may be causing increased HR. I also informed her that HR isn't alarming at 103 with no new/increased symptoms, but to call us back if new symptoms arise or current ones worsen. I informed her that I would forward this to Dr. Aundra Dubin for review and recommendations as he is not in the office today.

## 2013-12-04 NOTE — Telephone Encounter (Signed)
EKG scheduled for 10/28 per Dr.McLean.

## 2013-12-04 NOTE — Telephone Encounter (Signed)
New message      Pt has a very bad headache this am.  Her bp is 140/72 and pulse 103.  Please call

## 2013-12-04 NOTE — Telephone Encounter (Signed)
Get ECG when she comes by office.

## 2013-12-05 ENCOUNTER — Other Ambulatory Visit: Payer: Self-pay

## 2013-12-05 ENCOUNTER — Encounter: Payer: Self-pay | Admitting: Nurse Practitioner

## 2013-12-05 ENCOUNTER — Encounter: Payer: Self-pay | Admitting: *Deleted

## 2013-12-05 ENCOUNTER — Ambulatory Visit (HOSPITAL_BASED_OUTPATIENT_CLINIC_OR_DEPARTMENT_OTHER): Payer: Medicare Other | Admitting: Radiology

## 2013-12-05 ENCOUNTER — Other Ambulatory Visit: Payer: Self-pay | Admitting: Nurse Practitioner

## 2013-12-05 ENCOUNTER — Ambulatory Visit (INDEPENDENT_AMBULATORY_CARE_PROVIDER_SITE_OTHER): Payer: Medicare Other | Admitting: Nurse Practitioner

## 2013-12-05 ENCOUNTER — Encounter (INDEPENDENT_AMBULATORY_CARE_PROVIDER_SITE_OTHER): Payer: Medicare Other

## 2013-12-05 VITALS — BP 120/64 | HR 98 | Ht 62.0 in | Wt 128.0 lb

## 2013-12-05 DIAGNOSIS — I5032 Chronic diastolic (congestive) heart failure: Secondary | ICD-10-CM

## 2013-12-05 DIAGNOSIS — R0602 Shortness of breath: Secondary | ICD-10-CM

## 2013-12-05 DIAGNOSIS — I2510 Atherosclerotic heart disease of native coronary artery without angina pectoris: Secondary | ICD-10-CM

## 2013-12-05 DIAGNOSIS — I480 Paroxysmal atrial fibrillation: Secondary | ICD-10-CM

## 2013-12-05 DIAGNOSIS — Z5309 Procedure and treatment not carried out because of other contraindication: Secondary | ICD-10-CM | POA: Diagnosis not present

## 2013-12-05 DIAGNOSIS — R001 Bradycardia, unspecified: Secondary | ICD-10-CM

## 2013-12-05 DIAGNOSIS — I481 Persistent atrial fibrillation: Secondary | ICD-10-CM | POA: Diagnosis not present

## 2013-12-05 DIAGNOSIS — I495 Sick sinus syndrome: Secondary | ICD-10-CM

## 2013-12-05 DIAGNOSIS — I4891 Unspecified atrial fibrillation: Secondary | ICD-10-CM

## 2013-12-05 LAB — BASIC METABOLIC PANEL
BUN: 15 mg/dL (ref 6–23)
CO2: 23 mEq/L (ref 19–32)
Calcium: 9.8 mg/dL (ref 8.4–10.5)
Chloride: 91 mEq/L — ABNORMAL LOW (ref 96–112)
Creatinine, Ser: 0.9 mg/dL (ref 0.4–1.2)
GFR: 65.49 mL/min (ref >60.00)
Glucose, Bld: 98 mg/dL (ref 70–99)
Potassium: 3.4 mEq/L — ABNORMAL LOW (ref 3.5–5.1)
Sodium: 128 mEq/L — ABNORMAL LOW (ref 135–145)

## 2013-12-05 LAB — CBC
HCT: 41.5 % (ref 36.0–46.0)
Hemoglobin: 13.8 g/dL (ref 12.0–15.0)
MCHC: 33.1 g/dL (ref 30.0–36.0)
MCV: 112.9 fl — ABNORMAL HIGH (ref 78.0–100.0)
Platelets: 317 10*3/uL (ref 150.0–400.0)
RBC: 3.68 Mil/uL — ABNORMAL LOW (ref 3.87–5.11)
RDW: 15.4 % (ref 11.5–15.5)
WBC: 6.8 10*3/uL (ref 4.0–10.5)

## 2013-12-05 LAB — PROTIME-INR
INR: 2.2 ratio — ABNORMAL HIGH (ref 0.8–1.0)
Prothrombin Time: 24 s — ABNORMAL HIGH (ref 9.6–13.1)

## 2013-12-05 LAB — APTT: aPTT: 35.1 s — ABNORMAL HIGH (ref 23.4–32.7)

## 2013-12-05 MED ORDER — POTASSIUM CHLORIDE CRYS ER 20 MEQ PO TBCR: EXTENDED_RELEASE_TABLET | ORAL | Status: DC

## 2013-12-05 MED ORDER — AMIODARONE HCL 200 MG PO TABS: 200.0000 mg | ORAL_TABLET | Freq: Every day | ORAL | Status: DC

## 2013-12-05 MED ORDER — SODIUM CHLORIDE 0.9 % IV SOLN
INTRAVENOUS | Status: DC
  Administered 2013-12-06: 500 mL via INTRAVENOUS

## 2013-12-05 NOTE — Progress Notes (Signed)
Echocardiogram performed.  

## 2013-12-05 NOTE — Progress Notes (Signed)
Sarah Mays Date of Birth: October 25, 1930 Medical Record #[id number]  History of Present Illness: Sarah Mays is seen today as a urgent work in. Seen for Dr. Aundra Dubin. She is an 80 year old female with PAF and chronic diastolic HF - other issues as noted below. She is on chronic amiodarone - at low dose of 100 mg. She is chronically anticoagulated with Xarelto 15 mg a day. CHADSVASC of 18 (age, female gender, HTN, CHF).   Was here just 5 days ago - saw Dr. Aundra Dubin - was noted to be in sinus rhythm. She had been to her PCP prior to her last visit here with dizzy and lightheaded spells. Dr. Aundra Dubin stopped her Bystolic due to low HR (40 to 50's) and he arranged a Holter. No syncope had been reported. Seemed to have some volume overload and he increased her Lasix and had echo repeated. Hydralazine was also increased due to higher BP and in light of the fact that he was stopping Bystolic.  Comes in here today - feels horrible - has had her monitor put on and has had her echo this morning - no results yet. Taken to the nurse room and then added to my schedule due to not feeling well and daughter's concern.  Comes in the room. She is in a wheelchair - this is unlike her to use a wheelchair. She is here with her daughter. She says she was fine up until yesterday - got dizzy and lightheaded - thought she would pass out - now just very weak and unsteady. Not aware that she is back in AF. No actual palpitations. No chest pain. She has remained on her Xarelto - no missed doses. Recent labs noted. She spent the night in "[facility] Care" at [facility].   Current Outpatient Prescriptions  Medication Sig Dispense Refill  . amiodarone (PACERONE) 200 MG tablet Take 1/2 tab (100 mg) by mouth daily.  15 tablet  6  . Cholecalciferol (VITAMIN D3) 400 UNITS CAPS Take 1 capsule by mouth daily.      . furosemide (LASIX) 40 MG tablet Take 1.5 tablets (60 mg total) by mouth 2 (two) times daily.  90 tablet  6  .  GLUCOSAMINE-CHONDROITIN DS PO Take by mouth. Take 1 tab daily      . hydrALAZINE (APRESOLINE) 50 MG tablet Take 1.5 tablets (75 mg total) by mouth 3 (three) times daily.  135 tablet  6  . hydroxyurea (HYDREA) 500 MG capsule Two capsules (1000 mg) on Mondays and 500 mg daily the rest of the week, new change since 07/12/13  40 capsule  3  . ibandronate (BONIVA) 150 MG tablet Take 150 mg by mouth every 30 (thirty) days. Take in the morning with a full glass of water, on an empty stomach, and do not take anything else by mouth or lie down for the next 30 min.      [name] Kitchen levothyroxine (SYNTHROID, LEVOTHROID) 112 MCG tablet Take 112 mcg by mouth daily before breakfast.      . potassium chloride SA (KLOR-CON M20) 20 MEQ tablet TAKE 1 TABLET BY MOUTH DAILY  30 tablet  6  . traMADol (ULTRAM) 50 MG tablet Take 1 tablet (50 mg total) by mouth at bedtime as needed.  30 tablet  0  . XARELTO 15 MG TABS tablet Take 15 mg by mouth daily.       No current facility-administered medications for this visit.    No Known Allergies   PMH: 1. Atrial  fibrillation: Diagnosed initially in 10/13. Holter monitor in 11/13 showed atrial fibrillation with average rate 65. Back in NSR on amiodarone.  2. Chronic diastolic CHF: Echo (76/19) with EF 65-70%, mild MR, moderate biatrial enlargement, moderate TR, PA systolic pressure 45 mmHg.  3. ETT-Sestamibi (11/13): Exercised to stage II, small partially reversible anteroapical perfusion defect suggesting mild ischemia versus attenuation, EF 80%.  4. HTN: Lower extremity swelling with amlodipine.  5. Polycythemia vera: Has had phlebotomy only once.  6. Hypothyroidism  7. Macular degeneration  8. TAH 1980  9. Familial hypocalciuric hypercalcemia  10. PFTs (amiodarone use) in 1/14 showed a mild obstructive defect.    Past Medical History  Diagnosis Date  . Atrial fibrillation   . Hypertension   . Mitral valve disorders   . Tricuspid valve disorders, specified as nonrheumatic     . Diastolic dysfunction   . Shortness of breath   . Peripheral edema   . Neck mass     right, w/u including MRI negative  . Hypothyroidism   . Macular degeneration     Left, s/p inj. tx  . Senile osteoporosis     Reclast in the past  . Alopecia, unspecified   . Pure hypercholesterolemia   . Congestive heart failure, unspecified   . Headache(784.0)   . Backache, unspecified   . Polycythemia vera(238.4)   . Gouty arthropathy, unspecified   . Insomnia, unspecified   . Other malaise and fatigue   . Hyposmolality and/or hyponatremia   . Hyperpotassemia   . Edema   . Closed fracture of lower end of radius with ulna   . Hypercalcemia   . Deafness     Left, s/p multiple surgeries    Past Surgical History  Procedure Laterality Date  . Breast biopsy  06/18/1998    left  . Total abdominal hysterectomy    . Tonsillectomy    . Breast biopsy      History  Smoking status  . Former Smoker  . Types: Cigarettes  . Quit date: 01/19/1984  Smokeless tobacco  . Never Used    History  Alcohol Use  . 0.6 oz/week  . 1 Glasses of wine per week    Comment: 2 per day/ 14 a week    Family History  Problem Relation Age of Onset  . Hypertension Father   . Heart disease Father     patient does not know details.   . Breast cancer Daughter   . Cancer Daughter     breast  . Ovarian cancer Mother 40  . Cancer Sister     colon  . Heart disease Sister   . Cancer Sister   . Cancer Sister     hodgkin disease    Review of Systems: The review of systems is per the HPI.  All other systems were reviewed and are negative.  Physical Exam: BP 120/64  Pulse 98  Ht 5\' 2"  (1.575 m)  Wt 128 lb (58.06 kg)  BMI 23.41 kg/m2  SpO2 93% Patient is very pleasant and in no acute distress. Skin is warm and dry. Color is normal.  HEENT is unremarkable. Normocephalic/atraumatic. PERRL. Sclera are nonicteric. Neck is supple. No masses. No JVD. Lungs are clear. Cardiac exam shows an irregular rate  and rhythm. Rate is about 100 by me. Abdomen is soft. Extremities are without significant edema. Gait not tested. No gross neurologic deficits noted.  Wt Readings from Last 3 Encounters:  12/05/13 128 lb (58.06 kg)  11/30/13 124  lb (56.246 kg)  11/28/13 131 lb 8 oz (59.648 kg)    LABORATORY DATA/PROCEDURES: EKG today shows AF with fair ventricular response - rate is 98. Tracing is reviewed with Dr. Acie Fredrickson.   Lab Results  Component Value Date   WBC 6.7 11/19/2013   HGB 12.4 11/19/2013   HCT 37.8 11/19/2013   PLT 269.0 11/19/2013   GLUCOSE 84 11/30/2013   CHOL 189 02/22/2012   TRIG 92.0 02/22/2012   HDL 85.90 02/22/2012   LDLCALC 85 02/22/2012   ALT 28 11/28/2013   AST 26 11/28/2013   NA 137 11/30/2013   K 4.3 11/30/2013   CL 98 11/30/2013   CREATININE 1.0 11/30/2013   BUN 21 11/30/2013   CO2 29 11/30/2013   TSH 0.87 11/19/2013    BNP (last 3 results)  Recent Labs  11/28/13 1626 11/30/13 1007  PROBNP 1893.00* 399.0*     Assessment / Plan: 1. PAF - she is back in AF - I wonder if this is a result of her bradycardia - may have some element of SSS - discussed with Dr. Acie Fredrickson - will increase her amiodarone to 200 mg a day - arranging for cardioversion tomorrow morning with Dr. Meda Coffee - refer to EP for consideration of pacemaker. We will NOT add back the Bystolic due to the already noted bradycardia. Echo pending. Holter is in place.   2. Diastolic HF - already on higher doses of Lasix - would continue. I suspect this will improve with restoration of NSR.   Would keep in [place] until after cardioversion.  Patient is agreeable to this plan and will call if any problems develop in the interim.   Burtis Junes, RN, [place] [address] [place], [place]  [phone]

## 2013-12-05 NOTE — Patient Instructions (Addendum)
We will be checking the following labs today BMET, CBC, INR, PTT  Stay on your current medicines but increase the amiodarone to a whole tablet (200mg ) each day  We will refer you to EP for discussion of a pacemaker  Keep your follow up with Dr. Kendall Flack are scheduled for a cardioversion on Thursday, October 29th at 10AM with Dr. Meda Coffee or associates. Please go to [facility] [place] [facility] at [place] through the [place] not have any food or drink after midnight tonight.  You may take your medicines with a sip of water on the day of your procedure.  You will need someone to drive you home following your procedure.   We will do a CXR at the hospital tomorrow  Keep wearing your monitor  Call the [place] office at [phone] if you have any questions, problems or concerns.      Electrical Cardioversion Electrical cardioversion is the delivery of a jolt of electricity to change the rhythm of the heart. Sticky patches or metal paddles are placed on the chest to deliver the electricity from a device. This is done to restore a normal rhythm. A rhythm that is too fast or not regular keeps the heart from pumping well. Electrical cardioversion is done in an emergency if:   There is low or no blood pressure as a result of the heart rhythm.   Normal rhythm must be restored as fast as possible to protect the brain and heart from further damage.   It may save a life. Cardioversion may be done for heart rhythms that are not immediately life threatening, such as atrial fibrillation or flutter, in which:   The heart is beating too fast or is not regular.   Medicine to change the rhythm has not worked.   It is safe to wait in order to allow time for preparation.  Symptoms of the abnormal rhythm are bothersome.  The risk of stroke and other serious problems can be reduced. LET [facility] CARE PROVIDER KNOW ABOUT:   Any  allergies you have.  All medicines you are taking, including vitamins, herbs, eye drops, creams, and over-the-counter medicines.  Previous problems you or members of your family have had with the use of anesthetics.   Any blood disorders you have.   Previous surgeries you have had.   Medical conditions you have. RISKS AND COMPLICATIONS  Generally, this is a safe procedure. However, problems can occur and include:   Breathing problems related to the anesthetic used.  A blood clot that breaks free and travels to other parts of your body. This could cause a stroke or other problems. The risk of this is lowered by use of blood-thinning medicine (anticoagulant) prior to the procedure.  Cardiac arrest (rare). BEFORE THE PROCEDURE   You may have tests to detect blood clots in your heart and to evaluate heart function.  You may start taking anticoagulants so your blood does not clot as easily.   Medicines may be given to help stabilize your heart rate and rhythm. PROCEDURE  You will be given medicine through an IV tube to reduce discomfort and make you sleepy (sedative).   An electrical shock will be delivered. AFTER THE PROCEDURE Your heart rhythm will be watched to make sure it does not change.  Document Released: 01/15/2002 Document Revised: 06/11/2013 Document Reviewed: 08/09/2012 [facility] Patient Information 2015 [place], [place]. This information is not intended to  replace advice given to you by your health care provider. Make sure you discuss any questions you have with your health care provider.  

## 2013-12-05 NOTE — Progress Notes (Signed)
Patient ID: Sarah Mays, female   DOB: [birth year], 80 y.o.   MRN: [id number] [facility] 24 hour holter  Monitor applied to patient.

## 2013-12-06 ENCOUNTER — Encounter (HOSPITAL_COMMUNITY): Payer: Self-pay

## 2013-12-06 ENCOUNTER — Ambulatory Visit (HOSPITAL_COMMUNITY): Payer: Medicare Other

## 2013-12-06 ENCOUNTER — Ambulatory Visit (HOSPITAL_COMMUNITY)
Admission: RE | Admit: 2013-12-06 | Discharge: 2013-12-06 | Disposition: A | Discharge status: Home / Self Care | Payer: Medicare Other | Source: Ambulatory Visit | Attending: Cardiology | Admitting: Cardiology

## 2013-12-06 ENCOUNTER — Telehealth: Payer: Self-pay

## 2013-12-06 ENCOUNTER — Encounter (HOSPITAL_COMMUNITY): Payer: Medicare Other | Admitting: Certified Registered Nurse Anesthetist

## 2013-12-06 ENCOUNTER — Encounter (HOSPITAL_COMMUNITY)
Admission: RE | Disposition: A | Discharge status: Home / Self Care | Payer: Self-pay | Source: Ambulatory Visit | Attending: Cardiology

## 2013-12-06 ENCOUNTER — Other Ambulatory Visit: Payer: Self-pay

## 2013-12-06 ENCOUNTER — Ambulatory Visit (HOSPITAL_COMMUNITY): Payer: Medicare Other | Admitting: Certified Registered Nurse Anesthetist

## 2013-12-06 DIAGNOSIS — J90 Pleural effusion, not elsewhere classified: Secondary | ICD-10-CM | POA: Diagnosis not present

## 2013-12-06 DIAGNOSIS — Z01811 Encounter for preprocedural respiratory examination: Secondary | ICD-10-CM

## 2013-12-06 DIAGNOSIS — I480 Paroxysmal atrial fibrillation: Secondary | ICD-10-CM

## 2013-12-06 DIAGNOSIS — I4891 Unspecified atrial fibrillation: Secondary | ICD-10-CM | POA: Diagnosis not present

## 2013-12-06 DIAGNOSIS — Z01818 Encounter for other preprocedural examination: Secondary | ICD-10-CM | POA: Diagnosis not present

## 2013-12-06 DIAGNOSIS — I495 Sick sinus syndrome: Secondary | ICD-10-CM

## 2013-12-06 DIAGNOSIS — Z5309 Procedure and treatment not carried out because of other contraindication: Secondary | ICD-10-CM | POA: Insufficient documentation

## 2013-12-06 SURGERY — CANCELLED PROCEDURE
Anesthesia: Monitor Anesthesia Care

## 2013-12-06 MED ORDER — PROPOFOL 10 MG/ML IV BOLUS
INTRAVENOUS | Status: AC
  Filled 2013-12-06: qty 20

## 2013-12-06 MED ORDER — AMIODARONE HCL 200 MG PO TABS: 200.0000 mg | ORAL_TABLET | Freq: Every day | ORAL | Status: DC

## 2013-12-06 MED ORDER — POTASSIUM CHLORIDE CRYS ER 20 MEQ PO TBCR: EXTENDED_RELEASE_TABLET | ORAL | Status: DC

## 2013-12-06 MED ORDER — RIVAROXABAN 15 MG PO TABS
15.0000 mg | ORAL_TABLET | Freq: Every day | ORAL | Status: DC
  Administered 2013-12-06: 15 mg via ORAL
  Filled 2013-12-06: qty 1

## 2013-12-06 MED ORDER — POTASSIUM CHLORIDE CRYS ER 20 MEQ PO TBCR
40.0000 meq | EXTENDED_RELEASE_TABLET | Freq: Once | ORAL | Status: AC
  Administered 2013-12-06: 40 meq via ORAL
  Filled 2013-12-06: qty 2

## 2013-12-06 NOTE — OR Nursing (Signed)
Patient in NSR which confirmed by EKG Cardioversion aborted

## 2013-12-06 NOTE — Procedures (Signed)
Patient was in NSR with PACs this morning.  No DCCV needed.  Will increase amiodarone to 200 mg bid x 5 days, then 200 mg daily after that.  Followup with me as scheduled.

## 2013-12-06 NOTE — Telephone Encounter (Signed)
Take it 20 mEq bid for now.

## 2013-12-06 NOTE — Anesthesia Preprocedure Evaluation (Addendum)
Anesthesia Evaluation  Patient identified by MRN, date of birth, ID band Patient awake    Reviewed: Allergy & Precautions, H&P , NPO status , Patient's Chart, lab work & pertinent test results  Airway Mallampati: III  TM Distance: >3 FB Neck ROM: Full    Dental  (+) Teeth Intact, Dental Advisory Given   Pulmonary neg pulmonary ROS, former smoker (quit 1985),    Pulmonary exam normal       Cardiovascular hypertension, Pt. on medications +CHF + dysrhythmias Atrial Fibrillation Rhythm:Irregular Rate:Normal  Chronic diastolic heart failure, EF 60-65% PAF- amiodarone and xarelto Lasix recently increased    Neuro/Psych negative neurological ROS     GI/Hepatic negative GI ROS, Neg liver ROS,   Endo/Other  Hypothyroidism   Renal/GU negative Renal ROS     Musculoskeletal   Abdominal   Peds  Hematology  (+) Blood dyscrasia (xarelto, polycythemia vera), ,   Anesthesia Other Findings   Reproductive/Obstetrics                        Anesthesia Physical Anesthesia Plan  ASA: III  Anesthesia Plan: MAC   Post-op Pain Management:    Induction: Intravenous  Airway Management Planned: Mask  Additional Equipment:   Intra-op Plan:   Post-operative Plan:   Informed Consent: I have reviewed the patients History and Physical, chart, labs and discussed the procedure including the risks, benefits and alternatives for the proposed anesthesia with the patient or authorized representative who has indicated his/her understanding and acceptance.   Dental advisory given  Plan Discussed with: CRNA and Surgeon  Anesthesia Plan Comments: (Plan routine monitors, GA for cardioversion Pt in sinus cancelled by Dr. Aundra Dubin)      Anesthesia Quick Evaluation

## 2013-12-06 NOTE — Addendum Note (Signed)
Addendum created 12/06/13 1219 by Midge Minium, MD   Modules edited: Anesthesia Events

## 2013-12-06 NOTE — Telephone Encounter (Signed)
Looks like KCL was increased to 2 tablets daily yesterday by Tera Helper -K 3.4 yesterday,see lab results.  LMTCB

## 2013-12-06 NOTE — Addendum Note (Signed)
Addendum created 12/06/13 1221 by Midge Minium, MD   Modules edited: Anesthesia Events

## 2013-12-07 ENCOUNTER — Telehealth: Payer: Self-pay | Admitting: Cardiology

## 2013-12-07 ENCOUNTER — Telehealth: Payer: Self-pay | Admitting: Nurse Practitioner

## 2013-12-07 ENCOUNTER — Encounter: Payer: Self-pay | Admitting: Cardiology

## 2013-12-07 DIAGNOSIS — E876 Hypokalemia: Secondary | ICD-10-CM | POA: Diagnosis not present

## 2013-12-07 NOTE — Telephone Encounter (Signed)
New message ° ° ° ° °Want lab results °

## 2013-12-07 NOTE — Telephone Encounter (Signed)
Pt is in [facility] and will be D/C at 1:00 PM today. Pt was supposed for pt to have repeat BMET in [facility] hospital  When she was in for cardioversion, but it was not done. Nurse susan would like to know if the BMET needs to be done before pt is D/C home today or she needs to came to this office to have it done after D/C. Spoke with NP Cecille Rubin she would like for pt to get the lab work today before pt is D/C and fax results to Dr. Aundra Dubin. Manuela Schwartz aware she verbalized understanding.

## 2013-12-07 NOTE — Telephone Encounter (Signed)
Order written for lab but it wasn't completed when the patient came in for a cardioversion.. Requests a call back to discuss how to move forward /// Resident is going home by 1pm..

## 2013-12-07 NOTE — Telephone Encounter (Signed)
The pt is requesting results from lab work that was drawn this am. She is advised that we have not received those results yet as they were drawn at a nursing facility and that when we do receive them we will call her with results. She verbalized understanding.

## 2013-12-08 ENCOUNTER — Other Ambulatory Visit: Payer: Self-pay | Admitting: Cardiology

## 2013-12-09 ENCOUNTER — Telehealth: Payer: Self-pay | Admitting: Cardiology

## 2013-12-09 ENCOUNTER — Other Ambulatory Visit: Payer: Self-pay

## 2013-12-09 NOTE — Telephone Encounter (Signed)
Pt with known afib, recently in for DCCV but was found to be in sinus. Called by [facility] facility and spoke with caregiver who said that Sarah Mays felt flushed and uncomfortable and possibly back in afib due to irregular pulse. Blood pressure of 130s/70s. Pulse in the 60s. Taking the increased amiodarone dose, one more day in Rx.  No red flag symptoms of chest pain, syncope, pre-syncope, very fast or very slow rate. Stated that based upon the limited information available, no emergent indication to see ER care but deferred decision to patient and caregiver. Has appointment later this week for followup. FYI to Dr. Aundra Dubin. Route to triage to call pt tomorrow for followup.

## 2013-12-10 ENCOUNTER — Telehealth: Payer: Self-pay | Admitting: Cardiology

## 2013-12-10 DIAGNOSIS — I495 Sick sinus syndrome: Secondary | ICD-10-CM

## 2013-12-10 DIAGNOSIS — I4891 Unspecified atrial fibrillation: Secondary | ICD-10-CM

## 2013-12-10 MED ORDER — AMIODARONE HCL 200 MG PO TABS: 200.0000 mg | ORAL_TABLET | Freq: Every day | ORAL | Status: DC

## 2013-12-10 MED ORDER — POTASSIUM CHLORIDE CRYS ER 10 MEQ PO TBCR: EXTENDED_RELEASE_TABLET | ORAL | Status: DC

## 2013-12-10 NOTE — Telephone Encounter (Signed)
Give patient a call, if still symptomatic can come in for ECG

## 2013-12-10 NOTE — Telephone Encounter (Signed)
See phone note 12/09/13

## 2013-12-10 NOTE — Telephone Encounter (Signed)
Pt states she is feeling better today. Reviewed with Dr Yancey Flemings changes if feeling better, heart rate and BP OK yesterday, see note 12/09/13 Dr Elias Else. Pt advised no changes if feeling better, keep appt with Dr Aundra Dubin 12/14/13.

## 2013-12-10 NOTE — Telephone Encounter (Signed)
New message    Patient calling stating the nurse called the on-called MD last night.  Spoke with Dr. Elias Else.  Calling to follow up

## 2013-12-11 ENCOUNTER — Ambulatory Visit: Payer: Medicare Other | Admitting: Family Medicine

## 2013-12-11 DIAGNOSIS — I4891 Unspecified atrial fibrillation: Secondary | ICD-10-CM | POA: Diagnosis not present

## 2013-12-11 DIAGNOSIS — R278 Other lack of coordination: Secondary | ICD-10-CM | POA: Diagnosis not present

## 2013-12-11 DIAGNOSIS — R2689 Other abnormalities of gait and mobility: Secondary | ICD-10-CM | POA: Diagnosis not present

## 2013-12-11 DIAGNOSIS — M1711 Unilateral primary osteoarthritis, right knee: Secondary | ICD-10-CM | POA: Diagnosis not present

## 2013-12-12 DIAGNOSIS — R278 Other lack of coordination: Secondary | ICD-10-CM | POA: Diagnosis not present

## 2013-12-12 DIAGNOSIS — I4891 Unspecified atrial fibrillation: Secondary | ICD-10-CM | POA: Diagnosis not present

## 2013-12-12 DIAGNOSIS — M1711 Unilateral primary osteoarthritis, right knee: Secondary | ICD-10-CM | POA: Diagnosis not present

## 2013-12-12 DIAGNOSIS — R2689 Other abnormalities of gait and mobility: Secondary | ICD-10-CM | POA: Diagnosis not present

## 2013-12-14 ENCOUNTER — Ambulatory Visit (INDEPENDENT_AMBULATORY_CARE_PROVIDER_SITE_OTHER): Payer: Medicare Other | Admitting: Cardiology

## 2013-12-14 ENCOUNTER — Other Ambulatory Visit: Payer: Self-pay | Admitting: *Deleted

## 2013-12-14 ENCOUNTER — Encounter: Payer: Self-pay | Admitting: Cardiology

## 2013-12-14 VITALS — BP 118/58 | HR 76 | Ht 62.0 in | Wt 123.0 lb

## 2013-12-14 DIAGNOSIS — I10 Essential (primary) hypertension: Secondary | ICD-10-CM | POA: Diagnosis not present

## 2013-12-14 DIAGNOSIS — I5032 Chronic diastolic (congestive) heart failure: Secondary | ICD-10-CM | POA: Diagnosis not present

## 2013-12-14 DIAGNOSIS — I2510 Atherosclerotic heart disease of native coronary artery without angina pectoris: Secondary | ICD-10-CM | POA: Diagnosis not present

## 2013-12-14 DIAGNOSIS — I480 Paroxysmal atrial fibrillation: Secondary | ICD-10-CM | POA: Diagnosis not present

## 2013-12-14 DIAGNOSIS — E876 Hypokalemia: Secondary | ICD-10-CM

## 2013-12-14 LAB — BRAIN NATRIURETIC PEPTIDE: Pro B Natriuretic peptide (BNP): 225 pg/mL — ABNORMAL HIGH (ref 0.0–100.0)

## 2013-12-14 LAB — BASIC METABOLIC PANEL
BUN: 24 mg/dL — ABNORMAL HIGH (ref 6–23)
CHLORIDE: 97 meq/L (ref 96–112)
CO2: 33 mEq/L — ABNORMAL HIGH (ref 19–32)
Calcium: 10.7 mg/dL — ABNORMAL HIGH (ref 8.4–10.5)
Creatinine, Ser: 1.3 mg/dL — ABNORMAL HIGH (ref 0.4–1.2)
GFR: 41.38 mL/min — ABNORMAL LOW (ref >60.00)
Glucose, Bld: 110 mg/dL — ABNORMAL HIGH (ref 70–99)
Potassium: 3.4 mEq/L — ABNORMAL LOW (ref 3.5–5.1)
SODIUM: 137 meq/L (ref 135–145)

## 2013-12-14 MED ORDER — POTASSIUM CHLORIDE CRYS ER 20 MEQ PO TBCR: 20.0000 meq | EXTENDED_RELEASE_TABLET | Freq: Every day | ORAL | Status: DC

## 2013-12-14 NOTE — Patient Instructions (Signed)
Your physician recommends that you have lab work today--BMET/BNP.  Your physician recommends that you schedule a follow-up appointment in: 2 months with Dr Aundra Dubin.

## 2013-12-16 NOTE — Progress Notes (Signed)
Patient ID: Sarah Mays, female   DOB: 06/02/1930, 80 y.o.   MRN: [id number] PCP: Dr. Yong Channel  80 yo with paroxysmal atrial fibrillation and chronic diastolic CHF returns for cardiology evaluation.  Since last appointment, she went into atrial fibrillation with RVR then converted on her own back to NSR after amiodarone was increased to 200 mg bid.  She is now taking amiodarone 200 mg daily.  Weight is down 1 lb.  At last appointment, she was volume overloaded and Lasix was increased.  She seems to be breathing better overall.  She is short of breath after walking 100 yards or walking up a flight of steps.  No chest pain, no orthopnea or PND.  No lightheadedness or syncope.  Echo in 10/15 showed normal EF, 60-65%.    ECG: NSR, 1st degree AV block, anterolateral T wave inversions  Labs (7/13): K 4.7, creatinine 0.56 Labs (8/13): K 5.4, creatinine 0.69 Labs (11/13): TSH 6.45, K 5.4=>3.8, creatinine 1.5=>1.2, BUN 70=>35, HCT 33.2, BNP 866, AST 25, ALT 36 Labs (12/13): K 3.5 => 2.8, creatinine 1.2 => 1.33, BUN 56 Labs (1/14): LDL 85, HDL 86, K 3.9, creatinine 1.0, BNP 447 Labs (4/14): K 4.1, creatinine 0.9, BNP 293, LFTs normal, TSH normal Labs (8/14): K 4.3, creatinine 1.2, LFTs normal, TSH normal Labs (12/14): LFTs normal, TSH normal Labs (3/15): K 4.5, creatinine 1.3, HCT 36.2 Labs (10/15): K 5.1, creatinine 0.9, LFTs normal, TSH normal, pBNP 1893  PMH: 1. Atrial fibrillation: Diagnosed initially in 10/13.  Holter monitor in 11/13 showed atrial fibrillation with average rate 65.  Back in NSR on amiodarone.   2. Chronic diastolic CHF: Echo (40/98) with EF 65-70%, mild MR, moderate biatrial enlargement, moderate TR, PA systolic pressure 45 mmHg.   Echo (10/15): EF 60-65%.   3. ETT-Sestamibi (11/13): Exercised to stage II, small partially reversible anteroapical perfusion defect suggesting mild ischemia versus attenuation, EF 80%.  4. HTN: Lower extremity swelling with amlodipine.  5.  Polycythemia vera: Has had phlebotomy only once.  6. Hypothyroidism 7. Macular degeneration 8. TAH 1980 9. Familial hypocalciuric hypercalcemia 10. PFTs (amiodarone use) in 1/14 showed a mild obstructive defect.   SH: Prior smoker (years ago).  Married, lived in [place] but now at [place] [employer]  Occasional ETOH.  Daughter works for [employer].   FH: CAD  ROS: All systems reviewed and negative except as per HPI.   Current Outpatient Prescriptions  Medication Sig Dispense Refill  . amiodarone (PACERONE) 200 MG tablet Take 1 tablet (200 mg total) by mouth daily. 30 tablet 6  . Cholecalciferol (VITAMIN D3) 400 UNITS CAPS Take 1 capsule by mouth daily.    . furosemide (LASIX) 40 MG tablet Take 1.5 tablets (60 mg total) by mouth 2 (two) times daily. 90 tablet 6  . GLUCOSAMINE-CHONDROITIN DS PO Take by mouth. Take 1 tab daily    . hydrALAZINE (APRESOLINE) 50 MG tablet Take 1.5 tablets (75 mg total) by mouth 3 (three) times daily. 135 tablet 6  . hydroxyurea (HYDREA) 500 MG capsule Two capsules (1000 mg) on Mondays and 500 mg daily the rest of the week, new change since 07/12/13 40 capsule 3  . ibandronate (BONIVA) 150 MG tablet Take 150 mg by mouth every 30 (thirty) days. Take in the morning with a full glass of water, on an empty stomach, and do not take anything else by mouth or lie down for the next 30 min.    [name] Kitchen levothyroxine (SYNTHROID, LEVOTHROID) 112 MCG tablet Take 112 mcg  by mouth daily before breakfast.    . traMADol (ULTRAM) 50 MG tablet Take 1 tablet (50 mg total) by mouth at bedtime as needed. 30 tablet 0  . XARELTO 15 MG TABS tablet Take 15 mg by mouth daily.    . potassium chloride SA (K-DUR,KLOR-CON) 20 MEQ tablet Take 1 tablet (20 mEq total) by mouth daily. 30 tablet 6   No current facility-administered medications for this visit.    BP 118/58 mmHg  Pulse 76  Ht 5\' 2"  (1.575 m)  Wt 123 lb (55.792 kg)  BMI 22.49 kg/m2 General: NAD Neck: JVP 7 cm, no thyromegaly or thyroid  nodule.  Lungs: CTAB CV: Nondisplaced PMI.  Heart regular S1/S2, no S3/S4, 1/6 early SEM.  No edema.  No carotid bruit.  Abdomen: Soft, nontender, no hepatosplenomegaly, no distention.  Neurologic: Alert and oriented x 3.  Psych: Normal affect. Extremities: No clubbing or cyanosis.   Assessment/Plan:  1. Atrial fibrillation: First noted in 10/13.  Atrial fibrillation has triggered acute on chronic diastolic CHF in the past.  She was in atrial fibrillation when she saw Truitt Merle recently but is in NSR today.  CHADSVASC score is 42 (age, female gender, HTN, CHF).  She is anticoagulated with Xarelto 15 mg daily.  She is off nebivolol due to bradycardia.  - She is back in NSR, will continue amiodarone at 200 mg daily (was on 100 mg daily prior).  Recent LFTs and TSH were normal.  Given amiodarone use, she will need yearly eye exams.  Baseline PFTs showed a mild obstructive defect.  - She will be seeing Thompson Grayer again soon. Given breakthrough symptomatic atrial fibrillation on amiodarone, would re-consider atrial fibrillation ablation.  2. CAD: Patient has not had any chest pain. She had an ETT-Sestamibi in 11/13 that showed a small partially reversible anteroapical perfusion defect.  This may be a small area of ischemia but soft tissue attenuation is certainly also possible.  This does not sound like a high risk study.  Would continue to manage medically.  3. Chronic diastolic CHF: Volume status improved on higher Lasix.  NYHA class II-III symptoms.  -Continue Lasix 60 mg bid.  BMET/BNP today.   4. HTN: BP controlled on current regimen.   5. Bradycardia: HR is higher off nebivolol, will leave off.  Suspect there is a component of tachy-brady syndrome, may need PCM in future.   Loralie Champagne 12/16/2013

## 2013-12-17 ENCOUNTER — Other Ambulatory Visit: Payer: Self-pay | Admitting: Hematology and Oncology

## 2013-12-17 DIAGNOSIS — R278 Other lack of coordination: Secondary | ICD-10-CM | POA: Diagnosis not present

## 2013-12-17 DIAGNOSIS — I4891 Unspecified atrial fibrillation: Secondary | ICD-10-CM | POA: Diagnosis not present

## 2013-12-17 DIAGNOSIS — M1711 Unilateral primary osteoarthritis, right knee: Secondary | ICD-10-CM | POA: Diagnosis not present

## 2013-12-17 DIAGNOSIS — R2689 Other abnormalities of gait and mobility: Secondary | ICD-10-CM | POA: Diagnosis not present

## 2013-12-20 DIAGNOSIS — H35051 Retinal neovascularization, unspecified, right eye: Secondary | ICD-10-CM | POA: Diagnosis not present

## 2013-12-20 DIAGNOSIS — H3532 Exudative age-related macular degeneration: Secondary | ICD-10-CM | POA: Diagnosis not present

## 2013-12-24 ENCOUNTER — Other Ambulatory Visit: Payer: Medicare Other

## 2013-12-24 ENCOUNTER — Ambulatory Visit: Payer: Medicare Other | Admitting: Internal Medicine

## 2013-12-24 DIAGNOSIS — R278 Other lack of coordination: Secondary | ICD-10-CM | POA: Diagnosis not present

## 2013-12-24 DIAGNOSIS — M1711 Unilateral primary osteoarthritis, right knee: Secondary | ICD-10-CM | POA: Diagnosis not present

## 2013-12-24 DIAGNOSIS — R2689 Other abnormalities of gait and mobility: Secondary | ICD-10-CM | POA: Diagnosis not present

## 2013-12-24 DIAGNOSIS — I4891 Unspecified atrial fibrillation: Secondary | ICD-10-CM | POA: Diagnosis not present

## 2013-12-26 ENCOUNTER — Encounter: Payer: Self-pay | Admitting: Internal Medicine

## 2013-12-26 ENCOUNTER — Ambulatory Visit (INDEPENDENT_AMBULATORY_CARE_PROVIDER_SITE_OTHER): Payer: Medicare Other | Admitting: Internal Medicine

## 2013-12-26 VITALS — BP 140/84 | HR 69 | Ht 62.0 in | Wt 126.8 lb

## 2013-12-26 DIAGNOSIS — I2510 Atherosclerotic heart disease of native coronary artery without angina pectoris: Secondary | ICD-10-CM | POA: Diagnosis not present

## 2013-12-26 DIAGNOSIS — E876 Hypokalemia: Secondary | ICD-10-CM

## 2013-12-26 DIAGNOSIS — I10 Essential (primary) hypertension: Secondary | ICD-10-CM | POA: Diagnosis not present

## 2013-12-26 DIAGNOSIS — I480 Paroxysmal atrial fibrillation: Secondary | ICD-10-CM | POA: Diagnosis not present

## 2013-12-26 NOTE — Patient Instructions (Addendum)
Your physician recommends that you schedule a follow-up appointment in: 4 weeks with Dr. Rayann Heman  Your physician recommends that you return for lab work today

## 2013-12-27 ENCOUNTER — Telehealth: Payer: Self-pay | Admitting: Cardiology

## 2013-12-27 LAB — BASIC METABOLIC PANEL
BUN: 21 mg/dL (ref 6–23)
CO2: 29 meq/L (ref 19–32)
Calcium: 10.7 mg/dL — ABNORMAL HIGH (ref 8.4–10.5)
Chloride: 102 mEq/L (ref 96–112)
Creatinine, Ser: 1.2 mg/dL (ref 0.4–1.2)
GFR: 47.61 mL/min — ABNORMAL LOW (ref >60.00)
Glucose, Bld: 97 mg/dL (ref 70–99)
Potassium: 4 mEq/L (ref 3.5–5.1)
SODIUM: 138 meq/L (ref 135–145)

## 2013-12-27 NOTE — Telephone Encounter (Signed)
New message    Patient calling to see if lab results was fax over from PCP.

## 2013-12-27 NOTE — Telephone Encounter (Signed)
Pt asking about lab results done yesterday -pt given results but aware Dr Rayann Heman has not reviewed results.

## 2013-12-27 NOTE — Progress Notes (Signed)
PCP:  Garret Reddish, MD The patient presents for further EP evaluation.  I saw her last early in 2014.  At that time, she reported that she was initially diagnosed with atrial fibrillation late September/early October 2013 when she developed progressive CHF and presented for an echo.  The echo showed preserved EF with mild pulmonary HTN.  On the echo, she was noted to be in atrial fibrillation.  This was confirmed with subsequent evaluations. She was initially started on warfarin but stopped it due to nosebleeds.  She was evaluated by Dr Aundra Dubin and placed on eliquis.  She then began amiodarone and converted to sinus rhythm.  She did very well with sinus rhythm.  She developed bradycardia for which I decreased her amiodarone to 100mg  daily.  She did well with this approach until recently.  She has returned to atrial fibrillation requiring recent admission.  Her amiodarone has been increased and she is doing a little better presently. Today, she denies symptoms of palpitations, chest pain, orthopnea, PND, dizziness, presyncope, syncope, or neurologic sequela. The patient is tolerating medications without difficulties and is otherwise without complaint today.     Past Medical History  Diagnosis Date  . Atrial fibrillation   . Hypertension   . Mitral valve disorders   . Tricuspid valve disorders, specified as nonrheumatic   . Diastolic dysfunction   . Shortness of breath   . Peripheral edema   . Neck mass     right, w/u including MRI negative  . Hypothyroidism   . Macular degeneration     Left, s/p inj. tx  . Senile osteoporosis     Reclast in the past  . Alopecia, unspecified   . Pure hypercholesterolemia   . Congestive heart failure, unspecified   . Headache(784.0)   . Backache, unspecified   . Polycythemia vera(238.4)   . Gouty arthropathy, unspecified   . Insomnia, unspecified   . Other malaise and fatigue   . Hyposmolality and/or hyponatremia   . Hyperpotassemia   . Edema   .  Closed fracture of lower end of radius with ulna   . Hypercalcemia   . Deafness     Left, s/p multiple surgeries   Past Surgical History  Procedure Laterality Date  . Breast biopsy  06/18/1998    left  . Total abdominal hysterectomy    . Tonsillectomy    . Breast biopsy      Current Outpatient Prescriptions  Medication Sig Dispense Refill  . amiodarone (PACERONE) 200 MG tablet Take 1 tablet (200 mg total) by mouth daily. 30 tablet 6  . Cholecalciferol (VITAMIN D3) 400 UNITS CAPS Take 1 capsule by mouth daily.    . furosemide (LASIX) 40 MG tablet Take 1.5 tablets (60 mg total) by mouth 2 (two) times daily. 90 tablet 6  . GLUCOSAMINE-CHONDROITIN DS PO Take 1 tablet by mouth daily.     . hydrALAZINE (APRESOLINE) 50 MG tablet Take 1.5 tablets (75 mg total) by mouth 3 (three) times daily. 135 tablet 6  . hydroxyurea (HYDREA) 500 MG capsule TAKE 2 CAPSULES ON MONDAYS AND TAKE 1 CAPSULE ALL OTHER DAYS OF THE WEEK. (Patient taking differently: TAKE 2 CAPSULES BY MOUTH ON MONDAYS AND TAKE 1 CAPSULE BY MOUTH ALL OTHER DAYS OF THE WEEK.) 35 capsule 6  . ibandronate (BONIVA) 150 MG tablet Take 150 mg by mouth every 30 (thirty) days. Take in the morning with a full glass of water, on an empty stomach, and do not take anything else  by mouth or lie down for the next 30 min.    [name] Kitchen levothyroxine (SYNTHROID, LEVOTHROID) 112 MCG tablet Take 112 mcg by mouth daily before breakfast.    . potassium chloride SA (K-DUR,KLOR-CON) 20 MEQ tablet Take 1 tablet (20 mEq total) by mouth daily. 30 tablet 6  . XARELTO 15 MG TABS tablet Take 15 mg by mouth daily.    . traMADol (ULTRAM) 50 MG tablet Take 1 tablet (50 mg total) by mouth at bedtime as needed. (Patient taking differently: Take 50 mg by mouth at bedtime as needed (pain). ) 30 tablet 0   No current facility-administered medications for this visit.    No Known Allergies  History   Social History  . Marital Status: Married    Spouse Name: [name]    Number  of Children: 2  . Years of Education: 16   Occupational History  . Not on file.   Social History Main Topics  . Smoking status: Former Smoker    Types: Cigarettes    Quit date: 01/19/1984  . Smokeless tobacco: Never Used  . Alcohol Use: 0.6 oz/week    1 Glasses of wine per week     Comment: 2 per day/ 14 a week  . Drug Use: No  . Sexual Activity: Not Currently   Other Topics Concern  . Not on file   Social History Narrative   Patient is Married 1955. 2 kids. 1 grandkid [employer]. Of [place]). Lives in apartment,  Independent Living  section at [place] since 02/2013.   No Smoking history, Mod. alcohol use.   Patient has  no Advanced planning documents   Hobbies: CPU and painting             Family History  Problem Relation Age of Onset  . Hypertension Father   . Heart disease Father     patient does not know details.   . Breast cancer Daughter   . Cancer Daughter     breast  . Ovarian cancer Mother 6  . Cancer Sister     colon  . Heart disease Sister   . Cancer Sister   . Cancer Sister     hodgkin disease    ROS-  All systems are reviewed and are negative except as outlined in the HPI above  Physical Exam: Filed Vitals:   12/26/13 1559  BP: 140/84  Pulse: 69  Height: 5\' 2"  (1.575 m)  Weight: 126 lb 12.8 oz (57.516 kg)    GEN- The patient is elderly appearing, alert and oriented x 3 today.   Head- normocephalic, atraumatic Eyes-  Sclera clear, conjunctiva pink Ears- hearing intact Oropharynx- clear Neck- supple,   Lymph- no cervical lymphadenopathy Lungs- Clear to ausculation bilaterally, normal work of breathing Heart- Regular rate and rhythm  GI- soft, NT, ND, + BS Extremities- no clubbing, cyanosis, or edema MS- no significant deformity or atrophy Skin- no rash or lesion Psych- euthymic mood, full affect Neuro- strength and sensation are intact  Echo is reviewed with patient today Event monitor is also  reviewed Patient are is discussed with Dr Aundra Dubin today Epic records are reviewed  Assessment and Plan:  1. Paroxysmal atrial fibrillation The patient has had recurrence of AF on low dose amiodarone.  She is now doing better with increased amiodarone.  She is appropriately anticoagulated.  Her chads2vasc score is at least 4.  Therapeutic strategies for afib including medicine and ablation were discussed in detail with the patient  today. I really do not have better AAD options.  Risk, benefits, and alternatives to EP study and radiofrequency ablation for afib were also discussed in detail today.  At this time, she would prefer to avoid ablation.  She would like to continue watchful waiting as she has done better recently with increased amiodarone.  She may consider ablation if her af worsens.  2. Sick sinus syndrome Presently, her heart rates are stable.  Dr Aundra Dubin and I have discussed her case in detail and would both like to avoid PPM at his time.  3. htn Stable No change required today  Return to the af clinic in 4 weeks If she continues to do well then we will avoid ablation.  If her afib worsens then she may be more willing to consider this procedure.

## 2013-12-28 DIAGNOSIS — M1711 Unilateral primary osteoarthritis, right knee: Secondary | ICD-10-CM | POA: Diagnosis not present

## 2013-12-28 DIAGNOSIS — I4891 Unspecified atrial fibrillation: Secondary | ICD-10-CM | POA: Diagnosis not present

## 2013-12-28 DIAGNOSIS — R278 Other lack of coordination: Secondary | ICD-10-CM | POA: Diagnosis not present

## 2013-12-28 DIAGNOSIS — R2689 Other abnormalities of gait and mobility: Secondary | ICD-10-CM | POA: Diagnosis not present

## 2013-12-31 ENCOUNTER — Encounter: Payer: Self-pay | Admitting: Family Medicine

## 2014-01-04 ENCOUNTER — Encounter: Payer: Self-pay | Admitting: Family Medicine

## 2014-01-04 DIAGNOSIS — M810 Age-related osteoporosis without current pathological fracture: Secondary | ICD-10-CM | POA: Insufficient documentation

## 2014-01-04 DIAGNOSIS — M109 Gout, unspecified: Secondary | ICD-10-CM | POA: Insufficient documentation

## 2014-01-04 DIAGNOSIS — R519 Headache, unspecified: Secondary | ICD-10-CM | POA: Insufficient documentation

## 2014-01-04 DIAGNOSIS — R51 Headache: Secondary | ICD-10-CM

## 2014-01-16 ENCOUNTER — Ambulatory Visit: Payer: Medicare Other | Admitting: Cardiology

## 2014-01-29 DIAGNOSIS — H35051 Retinal neovascularization, unspecified, right eye: Secondary | ICD-10-CM | POA: Diagnosis not present

## 2014-01-29 DIAGNOSIS — H3532 Exudative age-related macular degeneration: Secondary | ICD-10-CM | POA: Diagnosis not present

## 2014-01-30 ENCOUNTER — Ambulatory Visit (INDEPENDENT_AMBULATORY_CARE_PROVIDER_SITE_OTHER): Payer: Medicare Other | Admitting: Internal Medicine

## 2014-01-30 ENCOUNTER — Encounter: Payer: Self-pay | Admitting: Internal Medicine

## 2014-01-30 VITALS — BP 152/70 | HR 70 | Ht 62.0 in | Wt 125.0 lb

## 2014-01-30 DIAGNOSIS — I495 Sick sinus syndrome: Secondary | ICD-10-CM | POA: Diagnosis not present

## 2014-01-30 DIAGNOSIS — I10 Essential (primary) hypertension: Secondary | ICD-10-CM | POA: Diagnosis not present

## 2014-01-30 DIAGNOSIS — I480 Paroxysmal atrial fibrillation: Secondary | ICD-10-CM | POA: Diagnosis not present

## 2014-01-30 DIAGNOSIS — I2510 Atherosclerotic heart disease of native coronary artery without angina pectoris: Secondary | ICD-10-CM | POA: Diagnosis not present

## 2014-01-30 NOTE — Patient Instructions (Signed)
Your physician recommends that you schedule a follow-up appointment in: March with Dr. Aundra Dubin.   Contact Dr. Rayann Heman as needed for follow up.

## 2014-01-31 NOTE — Progress Notes (Signed)
PCP:  Garret Reddish, MD Primary Cardiologist:  Dr Aundra Dubin  The patient presents today for routine electrophysiology followup.  Since last being seen in our clinic, the patient reports doing very well.  Her AF is well controlled.  She has had no symptoms of bradycardia. Today, she denies symptoms of palpitations, chest pain, shortness of breath, orthopnea, PND, lower extremity edema, dizziness, presyncope, syncope, or neurologic sequela.  The patient feels that she is tolerating medications without difficulties and is otherwise without complaint today.   Past Medical History  Diagnosis Date  . Atrial fibrillation   . Hypertension   . Mitral valve disorders   . Tricuspid valve disorders, specified as nonrheumatic   . Diastolic dysfunction   . Shortness of breath   . Peripheral edema   . Neck mass     right, w/u including MRI negative  . Hypothyroidism   . Macular degeneration     Left, s/p inj. tx  . Senile osteoporosis     Reclast in the past  . Alopecia, unspecified   . Pure hypercholesterolemia   . Congestive heart failure, unspecified   . Headache(784.0)   . Backache, unspecified   . Polycythemia vera(238.4)   . Gouty arthropathy, unspecified   . Insomnia, unspecified   . Other malaise and fatigue   . Hyposmolality and/or hyponatremia   . Hyperpotassemia   . Edema   . Closed fracture of lower end of radius with ulna   . Hypercalcemia   . Deafness     Left, s/p multiple surgeries   Past Surgical History  Procedure Laterality Date  . Breast biopsy  06/18/1998    left  . Total abdominal hysterectomy    . Tonsillectomy    . Breast biopsy      Current Outpatient Prescriptions  Medication Sig Dispense Refill  . amiodarone (PACERONE) 200 MG tablet Take 1 tablet (200 mg total) by mouth daily. 30 tablet 6  . Cholecalciferol (VITAMIN D3) 400 UNITS CAPS Take 1 capsule by mouth daily.    . furosemide (LASIX) 40 MG tablet Take 1.5 tablets (60 mg total) by mouth 2 (two)  times daily. 90 tablet 6  . GLUCOSAMINE-CHONDROITIN DS PO Take 1 tablet by mouth daily.     . hydrALAZINE (APRESOLINE) 50 MG tablet Take 1.5 tablets (75 mg total) by mouth 3 (three) times daily. 135 tablet 6  . hydroxyurea (HYDREA) 500 MG capsule TAKE 2 CAPSULES ON MONDAYS AND TAKE 1 CAPSULE ALL OTHER DAYS OF THE WEEK. (Patient taking differently: TAKE 2 CAPSULES BY MOUTH ON MONDAYS AND TAKE 1 CAPSULE BY MOUTH ALL OTHER DAYS OF THE WEEK.) 35 capsule 6  . ibandronate (BONIVA) 150 MG tablet Take 150 mg by mouth every 30 (thirty) days. Take in the morning with a full glass of water, on an empty stomach, and do not take anything else by mouth or lie down for the next 30 min.    [name] Kitchen levothyroxine (SYNTHROID, LEVOTHROID) 112 MCG tablet Take 112 mcg by mouth daily before breakfast.    . potassium chloride SA (K-DUR,KLOR-CON) 20 MEQ tablet Take 1 tablet (20 mEq total) by mouth daily. 30 tablet 6  . traMADol (ULTRAM) 50 MG tablet Take 1 tablet (50 mg total) by mouth at bedtime as needed. (Patient not taking: Reported on 01/30/2014) 30 tablet 0  . XARELTO 15 MG TABS tablet Take 15 mg by mouth daily.     No current facility-administered medications for this visit.    No Known Allergies  History   Social History  . Marital Status: Married    Spouse Name: [name]    Number of Children: 2  . Years of Education: 16   Occupational History  . Not on file.   Social History Main Topics  . Smoking status: Former Smoker    Types: Cigarettes    Quit date: 01/19/1984  . Smokeless tobacco: Never Used  . Alcohol Use: 0.6 oz/week    1 Glasses of wine per week     Comment: 2 per day/ 14 a week  . Drug Use: No  . Sexual Activity: Not Currently   Other Topics Concern  . Not on file   Social History Narrative   Patient is Married 1955. 2 kids. 1 grandkid. College graduate [name]. Of [place]). Lives in apartment,  Independent Living  section at [place] since 02/2013.   No Smoking history,  Mod. alcohol use.   Patient has  no Advanced planning documents   Hobbies: CPU and painting             Family History  Problem Relation Age of Onset  . Hypertension Father   . Heart disease Father     patient does not know details.   . Breast cancer Daughter   . Cancer Daughter     breast  . Ovarian cancer Mother 60  . Cancer Sister     colon  . Heart disease Sister   . Cancer Sister   . Cancer Sister     hodgkin disease    ROS-  All systems are reviewed and are negative except as outlined in the HPI above  Physical Exam: Filed Vitals:   01/30/14 1453  BP: 152/70  Pulse: 70  Height: 5\' 2"  (1.575 m)  Weight: 125 lb (56.7 kg)    GEN- The patient is well appearing, alert and oriented x 3 today.   Head- normocephalic, atraumatic Eyes-  Sclera clear, conjunctiva pink Ears- hearing intact Oropharynx- clear Neck- supple, no JVP Lymph- no cervical lymphadenopathy Lungs- Clear to ausculation bilaterally, normal work of breathing Heart- Regular rate and rhythm, no murmurs, rubs or gallops, PMI not laterally displaced GI- soft, NT, ND, + BS Extremities- no clubbing, cyanosis, or edema  ekg today reveals sinus rhythm with PVCs, incomplete RBBB  Assessment and Plan:  1. Paroxysmal atrial fibrillation Presently maintaining sinus with amiodarone.  She is appropriately anticoagulated.  Her chads2vasc score is at least 4.   No changes at this time  2. Sick sinus syndrome Presently, her heart rates are stable.  Dr Aundra Dubin and I have previously discussed her case in detail and would both like to avoid PPM at his time.  3. htn Stable No change required today  Follow-up with Dr Aundra Dubin as scheduled I will see as needed going forward

## 2014-02-19 DIAGNOSIS — H3532 Exudative age-related macular degeneration: Secondary | ICD-10-CM | POA: Diagnosis not present

## 2014-02-19 DIAGNOSIS — H35052 Retinal neovascularization, unspecified, left eye: Secondary | ICD-10-CM | POA: Diagnosis not present

## 2014-02-21 ENCOUNTER — Encounter (HOSPITAL_COMMUNITY): Payer: Self-pay | Admitting: Cardiology

## 2014-02-26 ENCOUNTER — Ambulatory Visit: Payer: Medicare Other | Admitting: Cardiology

## 2014-03-19 DIAGNOSIS — H3532 Exudative age-related macular degeneration: Secondary | ICD-10-CM | POA: Diagnosis not present

## 2014-03-19 DIAGNOSIS — H35051 Retinal neovascularization, unspecified, right eye: Secondary | ICD-10-CM | POA: Diagnosis not present

## 2014-03-20 ENCOUNTER — Other Ambulatory Visit: Payer: Self-pay | Admitting: Cardiology

## 2014-03-21 ENCOUNTER — Ambulatory Visit: Payer: Medicare Other | Admitting: Family Medicine

## 2014-03-28 ENCOUNTER — Ambulatory Visit: Payer: Medicare Other | Admitting: Family Medicine

## 2014-04-04 ENCOUNTER — Ambulatory Visit (INDEPENDENT_AMBULATORY_CARE_PROVIDER_SITE_OTHER): Payer: Medicare Other | Admitting: Family Medicine

## 2014-04-04 ENCOUNTER — Encounter: Payer: Self-pay | Admitting: Family Medicine

## 2014-04-04 VITALS — BP 140/64 | Temp 97.9°F | Wt 124.0 lb

## 2014-04-04 DIAGNOSIS — J329 Chronic sinusitis, unspecified: Secondary | ICD-10-CM | POA: Diagnosis not present

## 2014-04-04 DIAGNOSIS — B349 Viral infection, unspecified: Secondary | ICD-10-CM | POA: Diagnosis not present

## 2014-04-04 DIAGNOSIS — B9789 Other viral agents as the cause of diseases classified elsewhere: Secondary | ICD-10-CM

## 2014-04-04 NOTE — Patient Instructions (Addendum)
Please have cardiology send me your next set of bloodwork, I am strongly considering sending you to endocrinology vs. Working your calcium being slightly high up.   Let's check back in within 2 weeks of your next cardiology visit. If your daughter or family have any insight into endocrinologist you may have seen in area that would be helpful.   In regards to your sinus symptoms, Please return next Friday if symptoms persist, or sooner if symptoms improve and then worsen. Can use a 1/2 tramadol for the sinus pressure and to help with cough

## 2014-04-04 NOTE — Progress Notes (Signed)
Garret Reddish, MD Phone: [phone]  Subjective:   Sarah Mays is a 81 y.o. year old very pleasant female patient who presented today for 4 planned follow up but with acute complaints of Sinusitis. Patient started yesterday with low energy, sinus pressure. She also has yellow/green mucus coming from her nose as well as coughing up some. She has taken mutliple tylenol doses to help with sinus pressure which helps some. Has received antibiotics from prior provider in the past for similar complaints and requests antibiotics today. Symptoms started 2/24.   ROS- no fever/chills/nausea/vomiting.   Past Medical History- Patient Active Problem List   Diagnosis Date Noted  . Polycythemia vera     Priority: High  . Sick sinus syndrome 03/09/2012    Priority: High  . CAD (coronary artery disease) 12/27/2011    Priority: High  . Atrial fibrillation 12/27/2011    Priority: High  . Chronic diastolic CHF (congestive heart failure) 12/27/2011    Priority: High  . Osteoporosis 01/04/2014    Priority: Medium  . Hypothyroidism 11/19/2013    Priority: Medium  . Hypercalcemia 10/11/2013    Priority: Medium  . Insomnia 05/16/2013    Priority: Medium  . Senile osteoporosis     Priority: Medium  . Macular degeneration     Priority: Medium  . HTN (hypertension) 12/27/2011    Priority: Medium  . Gout 01/04/2014    Priority: Low  . Back pain 05/16/2013    Priority: Low  . Neck mass     Priority: Low  . AKI (acute kidney injury) 12/27/2011    Priority: Low  . Headache 01/04/2014   Medications- reviewed and updated Current Outpatient Prescriptions  Medication Sig Dispense Refill  . amiodarone (PACERONE) 200 MG tablet Take 1 tablet (200 mg total) by mouth daily. 30 tablet 6  . Cholecalciferol (VITAMIN D3) 400 UNITS CAPS Take 1 capsule by mouth daily.    . furosemide (LASIX) 40 MG tablet Take 1.5 tablets (60 mg total) by mouth 2 (two) times daily. 90 tablet 6  .  GLUCOSAMINE-CHONDROITIN DS PO Take 1 tablet by mouth daily.     . hydrALAZINE (APRESOLINE) 50 MG tablet Take 1.5 tablets (75 mg total) by mouth 3 (three) times daily. 135 tablet 6  . hydroxyurea (HYDREA) 500 MG capsule TAKE 2 CAPSULES ON MONDAYS AND TAKE 1 CAPSULE ALL OTHER DAYS OF THE WEEK. (Patient taking differently: TAKE 2 CAPSULES BY MOUTH ON MONDAYS AND TAKE 1 CAPSULE BY MOUTH ALL OTHER DAYS OF THE WEEK.) 35 capsule 6  . ibandronate (BONIVA) 150 MG tablet Take 150 mg by mouth every 30 (thirty) days. Take in the morning with a full glass of water, on an empty stomach, and do not take anything else by mouth or lie down for the next 30 min.    [name] Kitchen levothyroxine (SYNTHROID, LEVOTHROID) 112 MCG tablet Take 112 mcg by mouth daily before breakfast.    . potassium chloride SA (K-DUR,KLOR-CON) 20 MEQ tablet Take 1 tablet (20 mEq total) by mouth daily. 30 tablet 6  . XARELTO 15 MG TABS tablet TAKE 1 TABLET ONCE DAILY. 30 tablet 3  . traMADol (ULTRAM) 50 MG tablet Take 1 tablet (50 mg total) by mouth at bedtime as needed. (Patient not taking: Reported on 01/30/2014) 30 tablet 0   No current facility-administered medications for this visit.    Objective: BP 140/64 mmHg  Temp(Src) 97.9 F (36.6 C)  Wt 124 lb (56.246 kg) Gen: NAD, resting comfortably Turbinates erythematous and swollen  with slight yellow drainage, frontal sinuses with some tenderness, oropharynx normal CV: RRR no murmurs rubs or gallops Lungs: CTAB no crackles, wheeze, rhonchi Abdomen: soft/nontender/nondistended/normal bowel sounds.  Ext: no edema Skin: warm, dry, no rash   Assessment/Plan:  Sinusitis, viral 1 day of symptoms does not meet criteria for antibiotics for bacterial sinusitis including 10 days of symptoms or improvement then worsening. At this point, most likely viral. We had a long discussion about antibiotic overuse and appropriate use. Patient continued to ask for antibiotic and we focused on appropriate reasons for  follow up. Symptomatic control with tylenol, nasal saline rinses. Planned follow up next friday. Sooner return precautions advised.   I also viewed patient's last set of bloodwork and noted continued hypercalcemia. She has a history of hyperparathyroidism with no records available. I have asked patient to get a copy of records or determine who cared for her so we can request these and then would strongly consider endocrinology referral.   Would need PTH, vit D, Cr, calcium. Depending on results consider 24 hour urinary calcium. LIkely CKD Stage II-III with GFR 40-65 as risk factor as well.

## 2014-04-09 ENCOUNTER — Ambulatory Visit (INDEPENDENT_AMBULATORY_CARE_PROVIDER_SITE_OTHER): Payer: Medicare Other | Admitting: Family Medicine

## 2014-04-09 ENCOUNTER — Encounter: Payer: Self-pay | Admitting: Family Medicine

## 2014-04-09 VITALS — BP 138/82 | HR 98 | Ht 62.0 in | Wt 125.0 lb

## 2014-04-09 DIAGNOSIS — M545 Low back pain, unspecified: Secondary | ICD-10-CM

## 2014-04-09 MED ORDER — BACLOFEN 10 MG PO TABS
5.0000 mg | ORAL_TABLET | Freq: Every day | ORAL | Status: DC
Start: 2014-04-09 — End: 2014-08-07

## 2014-04-09 MED ORDER — BACLOFEN 10 MG PO TABS: 5.0000 mg | ORAL_TABLET | Freq: Every day | ORAL | Status: DC

## 2014-04-09 NOTE — Assessment & Plan Note (Signed)
Patient is having back exacerbation at this time that I think is likely giving her most of her pain. I do not think that this is more secondary to any worsening of the arthritis that is known from her previous x-rays. We discussed that we are running treat this more as a flare. Patient given 2 injection today to see if we can decrease some inflammation but will avoid significant anti-inflammatories secondary to patient's occasions. Patient will take tramadol and because it seems to cause stimulate affect we will do that during the day and given a different muscle relaxer at night. Patient will stop the Flexeril. Patient will come back and see me again in one week to make sure that she is improving. If not we'll consider advanced imaging. Patient also may need steroid dose.

## 2014-04-09 NOTE — Patient Instructions (Addendum)
Good to see you Ice 20 minutes 2 times daily. Usually after activity and before bed. Try the pennsaid topically 2 times a day Continue the tylenol and can try tramadol in the day Try baclofen at night.  Melatonin at night If not much better see me again in 1-2 weeks.

## 2014-04-09 NOTE — Progress Notes (Signed)
  [name] [address], [place] [address] Phone: [phone] Subjective:     CC: back pain  SLP:[id number] Sarah Mays is a 81 y.o. female coming in with complaint of back pain. Patient is a past medical history for severe osteoporosis. Patient last week is starting to have significant amount of pain in her lumbar spine. Patient states that he seems to be in the lower right side. Patient did follow sleep on the couch with her legs up in [place] inside that the discomfort. Patient is also unfortunately having a cough recently and continues to have pain whenever she cost. Denies any radiation down the legs or any numbness or tingling. States that it can be difficult to get comfortable at night. Patient has done Tylenol and tramadol with some mild relief. Patient has had difficulty sleeping at night secondary to cost as well as some of the pain.   Patient's x-rays have showed that patient does have degenerative changes.     Past medical history, social, surgical and family history all reviewed in electronic medical record.   Review of Systems: No headache, visual changes, nausea, vomiting, diarrhea, constipation, dizziness, abdominal pain, skin rash, fevers, chills, night sweats, weight loss, swollen lymph nodes, body aches, joint swelling, muscle aches, chest pain, shortness of breath, mood changes.   Objective Blood pressure 138/82, pulse 98, height 5\' 2"  (1.575 m), weight 125 lb (56.7 kg), SpO2 93 %.  General: No apparent distress alert and oriented x3 mood and affect normal, dressed appropriately.  HEENT: Pupils equal, extraocular movements intact  Respiratory: Patient's speak in full sentences and does not appear short of breath  Cardiovascular: No lower extremity edema, non tender, no erythema  Skin: Warm dry intact with no signs of infection or rash on extremities or on axial skeleton.  Abdomen: Soft nontender  Neuro: Cranial nerves II  through XII are intact, neurovascularly intact in all extremities with 2+ DTRs and 2+ pulses.  Lymph: No lymphadenopathy of posterior or anterior cervical chain or axillae bilaterally.  Gait normal with good balance and coordination.  MSK:  Non tender with full range of motion and good stability and symmetric strength and tone of shoulders, elbows, wrist, hip, knee and ankles bilaterally.  Back Exam:  Inspection: Unremarkable  Motion: Flexion 35 deg, Extension 35 deg, Side Bending to 30 deg bilaterally,  Rotation to 35 deg bilaterally  SLR laying: Negative  XSLR laying: Negative  Palpable tenderness: Minimally tender over the paraspinal musculature of the lumbar spine no spinous process with severe pain though over the right sacroiliac joint tenderness.. FABER: Positive right Sensory change: Gross sensation intact to all lumbar and sacral dermatomes.  Reflexes: 2+ at both patellar tendons, 2+ at achilles tendons, Babinski's downgoing.  Strength at foot  Plantar-flexion: 5/5 Dorsi-flexion: 5/5 Eversion: 5/5 Inversion: 5/5  Leg strength  Quad: 5/5 Hamstring: 4/5 Hip flexor: 4/5 Hip abductors: 4/5 which is an improvement Gait unremarkable.    Impression and Recommendations:     This case required medical decision making of moderate complexity.

## 2014-04-09 NOTE — Progress Notes (Signed)
Pre visit review using our clinic review tool, if applicable. No additional management support is needed unless otherwise documented below in the visit note. 

## 2014-04-10 ENCOUNTER — Telehealth: Payer: Self-pay | Admitting: Hematology and Oncology

## 2014-04-10 NOTE — Telephone Encounter (Signed)
pt called to r/s appt due to sinus infection...done....pt ok and aware of new d.t

## 2014-04-11 ENCOUNTER — Ambulatory Visit: Payer: Medicare Other | Admitting: Hematology and Oncology

## 2014-04-11 ENCOUNTER — Other Ambulatory Visit: Payer: Self-pay | Admitting: Cardiology

## 2014-04-11 ENCOUNTER — Other Ambulatory Visit: Payer: Medicare Other

## 2014-04-12 ENCOUNTER — Other Ambulatory Visit: Payer: Self-pay | Admitting: *Deleted

## 2014-04-12 ENCOUNTER — Ambulatory Visit (INDEPENDENT_AMBULATORY_CARE_PROVIDER_SITE_OTHER)
Admission: RE | Admit: 2014-04-12 | Discharge: 2014-04-12 | Disposition: A | Discharge status: Home / Self Care | Payer: Medicare Other | Source: Ambulatory Visit | Attending: Family Medicine | Admitting: Family Medicine

## 2014-04-12 ENCOUNTER — Encounter: Payer: Self-pay | Admitting: Family Medicine

## 2014-04-12 ENCOUNTER — Ambulatory Visit (INDEPENDENT_AMBULATORY_CARE_PROVIDER_SITE_OTHER): Payer: Medicare Other | Admitting: Family Medicine

## 2014-04-12 VITALS — BP 138/80 | HR 72 | Ht 62.0 in | Wt 125.0 lb

## 2014-04-12 DIAGNOSIS — M4316 Spondylolisthesis, lumbar region: Secondary | ICD-10-CM | POA: Diagnosis not present

## 2014-04-12 DIAGNOSIS — M549 Dorsalgia, unspecified: Secondary | ICD-10-CM

## 2014-04-12 DIAGNOSIS — M545 Low back pain, unspecified: Secondary | ICD-10-CM

## 2014-04-12 DIAGNOSIS — M47816 Spondylosis without myelopathy or radiculopathy, lumbar region: Secondary | ICD-10-CM | POA: Diagnosis not present

## 2014-04-12 MED ORDER — METHYLPREDNISOLONE ACETATE 40 MG/ML IJ SUSP
40.0000 mg | Freq: Once | INTRAMUSCULAR | Status: AC
  Administered 2014-04-12: 40 mg via INTRAMUSCULAR

## 2014-04-12 MED ORDER — KETOROLAC TROMETHAMINE 30 MG/ML IJ SOLN
30.0000 mg | Freq: Once | INTRAMUSCULAR | Status: AC
  Administered 2014-04-12: 30 mg via INTRAMUSCULAR

## 2014-04-12 MED ORDER — AMOXICILLIN-POT CLAVULANATE 875-125 MG PO TABS: 1.0000 | ORAL_TABLET | Freq: Two times a day (BID) | ORAL | Status: DC

## 2014-04-12 NOTE — Telephone Encounter (Signed)
Refill done.  

## 2014-04-12 NOTE — Progress Notes (Signed)
  [name] [address], [place] [address] Phone: [phone] Subjective:     CC: back pain  Sarah Mays is a 81 y.o. female coming in with complaint of back pain. Patient is a past medical history for severe osteoporosis. Patient last week is starting to have significant amount of pain in her lumbar spine. Patient was seen 3 days ago and was having more of an exacerbation or back pain. Patient elected to try more of pain medications and was given tramadol. Patient did not want any injections at that time. Patient was also given a muscle relaxer. Patient states that she has not made any significant improvement. Patient states that it is still severe and seems to be worse because she is also having sinus problems and continues with a cough. Patient denies any radiation down the leg or any numbness or tingling.  Patient's x-rays have showed that patient does have degenerative changes.     Past medical history, social, surgical and family history all reviewed in electronic medical record.   Review of Systems: No headache, visual changes, nausea, vomiting, diarrhea, constipation, dizziness, abdominal pain, skin rash, fevers, chills, night sweats, weight loss, swollen lymph nodes, body aches, joint swelling, muscle aches, chest pain, shortness of breath, mood changes.   Objective Blood pressure 138/80, pulse 72, height 5\' 2"  (1.575 m), weight 125 lb (56.7 kg), SpO2 95 %.  General: No apparent distress alert and oriented x3 mood and affect normal, dressed appropriately.  HEENT: Pupils equal, extraocular movements intact  Respiratory: Patient's speak in full sentences and does not appear short of breath  Cardiovascular: No lower extremity edema, non tender, no erythema  Skin: Warm dry intact with no signs of infection or rash on extremities or on axial skeleton.  Abdomen: Soft nontender  Neuro: Cranial nerves II through XII are intact,  neurovascularly intact in all extremities with 2+ DTRs and 2+ pulses.  Lymph: No lymphadenopathy of posterior or anterior cervical chain or axillae bilaterally.  Gait normal with good balance and coordination.  MSK:  Non tender with full range of motion and good stability and symmetric strength and tone of shoulders, elbows, wrist, hip, knee and ankles bilaterally.  Back Exam:  Inspection: Unremarkable  Motion: Flexion 35 deg, Extension 35 deg, Side Bending to 30 deg bilaterally,  Rotation to 35 deg bilaterally  SLR laying: Negative  XSLR laying: Negative  Palpable tenderness: Moderate tender over the paraspinal musculature of the lumbar spine no spinous process with severe pain though over the right sacroiliac joint tenderness  FABER: Positive right Sensory change: Gross sensation intact to all lumbar and sacral dermatomes.  Reflexes: 2+ at both patellar tendons, 2+ at achilles tendons, Babinski's downgoing.  Strength at foot  Plantar-flexion: 5/5 Dorsi-flexion: 5/5 Eversion: 5/5 Inversion: 5/5  Leg strength  Quad: 5/5 Hamstring: 4/5 Hip flexor: 4/5 Hip abductors: 4/5 which is an improvement Gait unremarkable.    Impression and Recommendations:     This case required medical decision making of moderate complexity.

## 2014-04-12 NOTE — Assessment & Plan Note (Signed)
Repeat x-rays today to make sure there is no compression fracture. We discussed icing, 2 injections today to decrease inflammation. We discussed home modalities. Patient will call if any worsening pain. Patient come back in one week for further evaluation and treatment. Patient knows to go to the emergency department if any radicular symptoms, numbness or weakness of the lower extremity.  Spent  25 minutes with patient face-to-face and had greater than 50% of counseling including as described above in assessment and plan.

## 2014-04-12 NOTE — Progress Notes (Signed)
Pre visit review using our clinic review tool, if applicable. No additional management support is needed unless otherwise documented below in the visit note. 

## 2014-04-12 NOTE — Patient Instructions (Addendum)
Good to see you and sorry you had to come back so soon.  2 injections today to make sure you feel better Continue the tramadol Heat 20 minutes, off 20 minutes, ice 20 minutes, off 1 hour, then repeat as much as you want.  Augmentin to take care of the infection We will get new xrays to make sure nothing else is worse.  Call me Monday to make sure better or call me on the weekend if you get worse.  I would like to make sure you are better in 7-10 days.

## 2014-04-15 ENCOUNTER — Other Ambulatory Visit (INDEPENDENT_AMBULATORY_CARE_PROVIDER_SITE_OTHER): Payer: Medicare Other

## 2014-04-15 ENCOUNTER — Ambulatory Visit
Admission: RE | Admit: 2014-04-15 | Discharge: 2014-04-15 | Disposition: A | Discharge status: Home / Self Care | Payer: Medicare Other | Source: Ambulatory Visit | Attending: Family Medicine | Admitting: Family Medicine

## 2014-04-15 ENCOUNTER — Telehealth: Payer: Self-pay | Admitting: Family Medicine

## 2014-04-15 ENCOUNTER — Ambulatory Visit: Payer: Medicare Other | Admitting: Family Medicine

## 2014-04-15 DIAGNOSIS — M47816 Spondylosis without myelopathy or radiculopathy, lumbar region: Secondary | ICD-10-CM | POA: Diagnosis not present

## 2014-04-15 DIAGNOSIS — M5416 Radiculopathy, lumbar region: Secondary | ICD-10-CM

## 2014-04-15 DIAGNOSIS — M5136 Other intervertebral disc degeneration, lumbar region: Secondary | ICD-10-CM | POA: Diagnosis not present

## 2014-04-15 DIAGNOSIS — M4806 Spinal stenosis, lumbar region: Secondary | ICD-10-CM | POA: Diagnosis not present

## 2014-04-15 DIAGNOSIS — M5126 Other intervertebral disc displacement, lumbar region: Secondary | ICD-10-CM | POA: Diagnosis not present

## 2014-04-15 LAB — COMPREHENSIVE METABOLIC PANEL
ALT: 15 U/L (ref 0–35)
AST: 13 U/L (ref 0–37)
Albumin: 4.1 g/dL (ref 3.5–5.2)
Alkaline Phosphatase: 103 U/L (ref 39–117)
BUN: 18 mg/dL (ref 6–23)
CALCIUM: 10.3 mg/dL (ref 8.4–10.5)
CHLORIDE: 95 meq/L — AB (ref 96–112)
CO2: 35 meq/L — AB (ref 19–32)
Creatinine, Ser: 1.03 mg/dL (ref 0.40–1.20)
GFR: 54.57 mL/min — ABNORMAL LOW (ref >60.00)
GLUCOSE: 92 mg/dL (ref 70–99)
POTASSIUM: 3.9 meq/L (ref 3.5–5.1)
SODIUM: 134 meq/L — AB (ref 135–145)
TOTAL PROTEIN: 6.9 g/dL (ref 6.0–8.3)
Total Bilirubin: 0.4 mg/dL (ref 0.2–1.2)

## 2014-04-15 LAB — CBC WITH DIFFERENTIAL/PLATELET
Basophils Absolute: 0.1 10*3/uL (ref 0.0–0.1)
Basophils Relative: 0.9 % (ref 0.0–3.0)
EOS ABS: 0.1 10*3/uL (ref 0.0–0.7)
Eosinophils Relative: 0.9 % (ref 0.0–5.0)
HEMATOCRIT: 36.9 % (ref 36.0–46.0)
HEMOGLOBIN: 12.4 g/dL (ref 12.0–15.0)
LYMPHS ABS: 0.8 10*3/uL (ref 0.7–4.0)
Lymphocytes Relative: 8.4 % — ABNORMAL LOW (ref 12.0–46.0)
MCHC: 33.7 g/dL (ref 30.0–36.0)
MCV: 109.8 fl — AB (ref 78.0–100.0)
Monocytes Absolute: 0.9 10*3/uL (ref 0.1–1.0)
Monocytes Relative: 9.7 % (ref 3.0–12.0)
NEUTROS ABS: 7.8 10*3/uL — AB (ref 1.4–7.7)
Neutrophils Relative %: 80.1 % — ABNORMAL HIGH (ref 43.0–77.0)
Platelets: 508 10*3/uL — ABNORMAL HIGH (ref 150.0–400.0)
RBC: 3.36 Mil/uL — ABNORMAL LOW (ref 3.87–5.11)
RDW: 15.9 % — ABNORMAL HIGH (ref 11.5–15.5)
WBC: 9.7 10*3/uL (ref 4.0–10.5)

## 2014-04-15 LAB — SEDIMENTATION RATE: SED RATE: 40 mm/h — AB (ref 0–22)

## 2014-04-15 LAB — CK: Total CK: 24 U/L (ref 7–177)

## 2014-04-15 LAB — C-REACTIVE PROTEIN: CRP: 0.6 mg/dL (ref 0.5–20.0)

## 2014-04-15 NOTE — Telephone Encounter (Signed)
Order entered

## 2014-04-15 NOTE — Telephone Encounter (Signed)
talked to patient need epidural L3-4  Will put in order.

## 2014-04-15 NOTE — Telephone Encounter (Signed)
Patient was calling to give an update on her condition. She would not give any other information

## 2014-04-15 NOTE — Telephone Encounter (Signed)
Spoke to pt, per dr Tamala Julian, labs & mri ordered. Pt understood.

## 2014-04-16 ENCOUNTER — Telehealth: Payer: Self-pay | Admitting: Family Medicine

## 2014-04-16 NOTE — Telephone Encounter (Signed)
Spoke to pt discussed lab results

## 2014-04-16 NOTE — Telephone Encounter (Signed)
Pt called in and wanted to know if you could call her Ria Comment?

## 2014-04-17 ENCOUNTER — Telehealth: Payer: Self-pay | Admitting: Cardiology

## 2014-04-17 ENCOUNTER — Telehealth: Payer: Self-pay | Admitting: Family Medicine

## 2014-04-17 NOTE — Telephone Encounter (Signed)
Pt and Cher at [facility] advised Ok to hold Xarelto for 1 day.

## 2014-04-17 NOTE — Telephone Encounter (Signed)
Request for surgical clearance:  1. What type of surgery is being performed? Epidural Injection  2. When is this surgery scheduled? Not scheduled yet  3. Are there any medications that need to be held prior to surgery and how long? Zorelto needs to be held for 1 day prior  4. Name of physician performing surgery? Dr. Hulan Saas  5. What is your office phone and fax number? Pt doesn't have phone number or fax number.  Please call pt back and advise.

## 2014-04-17 NOTE — Telephone Encounter (Signed)
Patient is asking for a return call. She is calling in reference to the injection. She would not give me any other information

## 2014-04-17 NOTE — Telephone Encounter (Signed)
OK to hold for 1 day Xarelto

## 2014-04-17 NOTE — Telephone Encounter (Signed)
Spoke to pt, she had not heard from [facility] about scheduling her epidural injection. i gave the pt [facility]'s phone #.

## 2014-04-18 ENCOUNTER — Ambulatory Visit
Admission: RE | Admit: 2014-04-18 | Discharge: 2014-04-18 | Disposition: A | Discharge status: Home / Self Care | Payer: Medicare Other | Source: Ambulatory Visit | Attending: Family Medicine | Admitting: Family Medicine

## 2014-04-18 DIAGNOSIS — M5126 Other intervertebral disc displacement, lumbar region: Secondary | ICD-10-CM | POA: Diagnosis not present

## 2014-04-18 DIAGNOSIS — M47816 Spondylosis without myelopathy or radiculopathy, lumbar region: Secondary | ICD-10-CM | POA: Diagnosis not present

## 2014-04-18 DIAGNOSIS — M5416 Radiculopathy, lumbar region: Secondary | ICD-10-CM

## 2014-04-18 MED ORDER — METHYLPREDNISOLONE ACETATE 40 MG/ML INJ SUSP (RADIOLOG
120.0000 mg | Freq: Once | INTRAMUSCULAR | Status: AC
  Administered 2014-04-18: 120 mg via EPIDURAL

## 2014-04-18 MED ORDER — IOHEXOL 180 MG/ML  SOLN
1.0000 mL | Freq: Once | INTRAMUSCULAR | Status: AC | PRN
  Administered 2014-04-18: 1 mL via EPIDURAL

## 2014-04-18 NOTE — Discharge Instructions (Signed)
Post Procedure Spinal Discharge Instruction Sheet  1. You may resume a regular diet and any medications that you routinely take (including pain medications).  2. No driving day of procedure.  3. Light activity throughout the rest of the day.  Do not do any strenuous work, exercise, bending or lifting.  The day following the procedure, you can resume normal physical activity but you should refrain from exercising or physical therapy for at least three days thereafter.   Common Side Effects:   Headaches- take your usual medications as directed by your physician.  Increase your fluid intake.  Caffeinated beverages may be helpful.  Lie flat in bed until your headache resolves.   Restlessness or inability to sleep- you may have trouble sleeping for the next few days.  Ask your referring physician if you need any medication for sleep.   Facial flushing or redness- should subside within a few days.   Increased pain- a temporary increase in pain a day or two following your procedure is not unusual.  Take your pain medication as prescribed by your referring physician.   Leg cramps  Please contact our office at [phone] for the following symptoms:  Fever greater than 100 degrees.  Headaches unresolved with medication after 2-3 days.  Increased swelling, pain, or redness at injection site.  Thank you for visiting our office.    May resume Xarelto today.

## 2014-04-19 ENCOUNTER — Telehealth: Payer: Self-pay | Admitting: Cardiology

## 2014-04-19 NOTE — Telephone Encounter (Signed)
Pt states she noticed her feet and ankles were swollen during her shower yesterday.   Pt states she had an epidural injection yesterday --she did not get IV fluids, no medication changes were been made. Pt states she has been on an antibiotic for sinus infection but no steroids.  Pt states this AM her feet and ankles are still swollen,not any worse than yesterday, maybe a little better today.  Pt does not weigh daily but does not feel puffy in abdomen, hands,face. Pt denies any changes in her breathing.  Pt does not know her heart rate or BP but thinks they are both Ok.  Pt's usual lasix dose 60mg  bid.  I discussed limiting NA in diet and keeping feet and legs elevated during the day, pt states she does these already.   Pt advised I will forward to Dr Aundra Dubin for review. Pt states she would be Ok with monitoring symptoms and not making changes at present if this is Dr Claris Gladden recommendation.

## 2014-04-19 NOTE — Telephone Encounter (Signed)
Suspect steroids in epidural injection.  Watch Na and keep legs elevated.

## 2014-04-19 NOTE — Telephone Encounter (Signed)
New message      Pt c/o swelling: STAT is pt has developed SOB within 24 hours  1. How long have you been experiencing swelling? Since yesterday 2. Where is the swelling located? Feet and legs 3.  Are you currently taking a "fluid pill"?  Yes, furosemide  4.  Are you currently SOB? Not any more than normal  5.  Have you traveled recently?no

## 2014-04-22 ENCOUNTER — Telehealth: Payer: Self-pay | Admitting: Family Medicine

## 2014-04-22 DIAGNOSIS — M5416 Radiculopathy, lumbar region: Secondary | ICD-10-CM

## 2014-04-22 MED ORDER — TRAMADOL HCL 50 MG PO TABS: 50.0000 mg | ORAL_TABLET | Freq: Every evening | ORAL | Status: DC | PRN

## 2014-04-22 NOTE — Telephone Encounter (Signed)
Follow up  ° ° ° °Returning call back to nurse  °

## 2014-04-22 NOTE — Telephone Encounter (Signed)
If back continues to hurt we will need to consider possible surgical intervention.  For the ear I would do flonase daily each nostril.

## 2014-04-22 NOTE — Telephone Encounter (Signed)
LMTCB

## 2014-04-22 NOTE — Telephone Encounter (Signed)
Pt want to speak to the assistant concern about pain that she is see Dr. Tamala Julian about.

## 2014-04-22 NOTE — Telephone Encounter (Signed)
Pt advised, verbalized understanding, agreed with plan. Pt advised to call if any changes in symptoms.  Pt has appt already scheduled 04/26/14 with Dr Aundra Dubin.

## 2014-04-22 NOTE — Telephone Encounter (Signed)
Spoke to pt, she stated that she had her epidural injection last Thursday 3.10.16 & she has not felt any relief at all. She also stated that since having her sinus infection her left ear is "stopped up" she called ENT & the earliest appt she could get is 4.12.16. Is there something she can do between now & then?

## 2014-04-23 MED ORDER — FLUTICASONE PROPIONATE 50 MCG/ACT NA SUSP: 2.0000 | Freq: Every day | NASAL | Status: DC

## 2014-04-23 NOTE — Telephone Encounter (Signed)
Spoke to pt & discussed her options. She would like to go ahead & try another injection. Order has been entered.  rx for flonase has been sent into pharmacy.

## 2014-04-25 ENCOUNTER — Encounter: Payer: Self-pay | Admitting: Internal Medicine

## 2014-04-25 ENCOUNTER — Ambulatory Visit (INDEPENDENT_AMBULATORY_CARE_PROVIDER_SITE_OTHER): Payer: Medicare Other | Admitting: Internal Medicine

## 2014-04-25 VITALS — BP 132/70 | HR 59 | Temp 98.0°F | Resp 18 | Ht 62.0 in | Wt 125.0 lb

## 2014-04-25 DIAGNOSIS — H6502 Acute serous otitis media, left ear: Secondary | ICD-10-CM

## 2014-04-25 DIAGNOSIS — I10 Essential (primary) hypertension: Secondary | ICD-10-CM

## 2014-04-25 NOTE — Patient Instructions (Addendum)
Serous Otitis Media Serous otitis media is fluid in the middle ear space. This space contains the bones for hearing and air. Air in the middle ear space helps to transmit sound.  The air gets there through the eustachian tube. This tube goes from the back of the nose (nasopharynx) to the middle ear space. It keeps the pressure in the middle ear the same as the outside world. It also helps to drain fluid from the middle ear space. CAUSES  Serous otitis media occurs when the eustachian tube gets blocked. Blockage can come from:  Ear infections.  Colds and other upper respiratory infections.  Allergies.  Irritants such as cigarette smoke.  Sudden changes in air pressure (such as descending in an airplane).  Enlarged adenoids.  A mass in the nasopharynx. During colds and upper respiratory infections, the middle ear space can become temporarily filled with fluid. This can happen after an ear infection also. Once the infection clears, the fluid will generally drain out of the ear through the eustachian tube. If it does not, then serous otitis media occurs. SIGNS AND SYMPTOMS   Hearing loss.  A feeling of fullness in the ear, without pain.  Young children may not show any symptoms but may show slight behavioral changes, such as agitation, ear pulling, or crying. DIAGNOSIS  Serous otitis media is diagnosed by an ear exam. Tests may be done to check on the movement of the eardrum. Hearing exams may also be done. TREATMENT  The fluid most often goes away without treatment. If allergy is the cause, allergy treatment may be helpful. Fluid that persists for several months may require minor surgery. A small tube is placed in the eardrum to:  Drain the fluid.  Restore the air in the middle ear space. In certain situations, antibiotic medicines are used to avoid surgery. Surgery may be done to remove enlarged adenoids (if this is the cause). HOME CARE INSTRUCTIONS   Keep children away from  tobacco smoke.  Keep all follow-up visits as directed by your health care provider. SEEK MEDICAL CARE IF:   Your hearing is not better in 3 months.  Your hearing is worse.  You have ear pain.  You have drainage from the ear.  You have dizziness.  You have serous otitis media only in one ear or have any bleeding from your nose (epistaxis).  You notice a lump on your neck. MAKE SURE YOU:  Understand these instructions.   Will watch your condition.   Will get help right away if you are not doing well or get worse.  Document Released: 04/17/2003 Document Revised: 06/11/2013 Document Reviewed: 08/22/2012 [facility] Patient Information 2015 [place], [place]. This information is not intended to replace advice given to you by your health care provider. Make sure you discuss any questions you have with your health care provider.   Follow-up ENT appointment on April 25.  If unimproved

## 2014-04-25 NOTE — Progress Notes (Signed)
Subjective:    Patient ID: Sarah Mays, female    DOB: 11/26/1930, 81 y.o.   MRN: [id number]  HPI 81 year old patient who presents today complaining of diminished auditory acuity from the left ear.  She was treated earlier in the month for a possible sinus infection and has completed antibiotic therapy.  She had the onset of URI symptoms about 3 weeks ago.  There is been no fever.  No focal sinus pain.  She states that she has partial deafness involving the right ear and has had bilateral ear surgery.   Past Medical History  Diagnosis Date  . Atrial fibrillation   . Hypertension   . Mitral valve disorders   . Tricuspid valve disorders, specified as nonrheumatic   . Diastolic dysfunction   . Shortness of breath   . Peripheral edema   . Neck mass     right, w/u including MRI negative  . Hypothyroidism   . Macular degeneration     Left, s/p inj. tx  . Senile osteoporosis     Reclast in the past  . Alopecia, unspecified   . Pure hypercholesterolemia   . Congestive heart failure, unspecified   . Headache(784.0)   . Backache, unspecified   . Polycythemia vera(238.4)   . Gouty arthropathy, unspecified   . Insomnia, unspecified   . Other malaise and fatigue   . Hyposmolality and/or hyponatremia   . Hyperpotassemia   . Edema   . Closed fracture of lower end of radius with ulna   . Hypercalcemia   . Deafness     Left, s/p multiple surgeries    History   Social History  . Marital Status: Married    Spouse Name: [name]  . Number of Children: 2  . Years of Education: 16   Occupational History  . Not on file.   Social History Main Topics  . Smoking status: Former Smoker    Types: Cigarettes    Quit date: 01/19/1984  . Smokeless tobacco: Never Used  . Alcohol Use: 0.6 oz/week    1 Glasses of wine per week     Comment: 2 per day/ 14 a week  . Drug Use: No  . Sexual Activity: Not Currently   Other Topics Concern  . Not on file   Social History Narrative   Patient is Married 1955. 2 kids. 1 grandkid. College graduate [name] [place]). Lives in apartment,  Independent Living  section at [place] since 02/2013.   No Smoking history, Mod. alcohol use.   Patient has  no Advanced planning documents   Hobbies: CPU and painting             Past Surgical History  Procedure Laterality Date  . Breast biopsy  06/18/1998    left  . Total abdominal hysterectomy    . Tonsillectomy    . Breast biopsy      Family History  Problem Relation Age of Onset  . Hypertension Father   . Heart disease Father     patient does not know details.   . Breast cancer Daughter   . Cancer Daughter     breast  . Ovarian cancer Mother 78  . Cancer Sister     colon  . Heart disease Sister   . Cancer Sister   . Cancer Sister     hodgkin disease    No Known Allergies  Current Outpatient Prescriptions on File Prior to Visit  Medication Sig Dispense Refill  .  amiodarone (PACERONE) 200 MG tablet Take 1 tablet (200 mg total) by mouth daily. 30 tablet 6  . baclofen (LIORESAL) 10 MG tablet Take 0.5 tablets (5 mg total) by mouth at bedtime. 30 each 0  . Cholecalciferol (VITAMIN D3) 400 UNITS CAPS Take 1 capsule by mouth daily.    . fluticasone (FLONASE) 50 MCG/ACT nasal spray Place 2 sprays into both nostrils daily. 16 g 3  . furosemide (LASIX) 40 MG tablet Take 1.5 tablets (60 mg total) by mouth 2 (two) times daily. 90 tablet 6  . GLUCOSAMINE-CHONDROITIN DS PO Take 1 tablet by mouth daily.     . hydrALAZINE (APRESOLINE) 50 MG tablet Take 1.5 tablets (75 mg total) by mouth 3 (three) times daily. 135 tablet 6  . hydroxyurea (HYDREA) 500 MG capsule TAKE 2 CAPSULES ON MONDAYS AND TAKE 1 CAPSULE ALL OTHER DAYS OF THE WEEK. (Patient taking differently: TAKE 2 CAPSULES BY MOUTH ON MONDAYS AND TAKE 1 CAPSULE BY MOUTH ALL OTHER DAYS OF THE WEEK.) 35 capsule 6  . ibandronate (BONIVA) 150 MG tablet TAKE ONE TABLET ONCE A MONTH IN THE MORNING ON AN EMPTY  STOMACH WITH WATER. REMAIN UPRIGHT. 1 tablet 6  . levothyroxine (SYNTHROID, LEVOTHROID) 112 MCG tablet Take 112 mcg by mouth daily before breakfast.    . potassium chloride SA (K-DUR,KLOR-CON) 20 MEQ tablet Take 1 tablet (20 mEq total) by mouth daily. 30 tablet 6  . traMADol (ULTRAM) 50 MG tablet Take 1 tablet (50 mg total) by mouth at bedtime as needed. 30 tablet 0  . XARELTO 15 MG TABS tablet TAKE 1 TABLET ONCE DAILY. 30 tablet 3   No current facility-administered medications on file prior to visit.    BP 132/70 mmHg  Pulse 59  Temp(Src) 98 F (36.7 C) (Oral)  Resp 18  Ht 5\' 2"  (1.575 m)  Wt 125 lb (56.7 kg)  BMI 22.86 kg/m2  SpO2 95%      Review of Systems     Objective:   Physical Exam  Constitutional: She appears well-developed and well-nourished. No distress.  HENT:  Mouth/Throat: Oropharynx is clear and moist.  Weber did not lateralize The right tympanic membrane and canal fairly normal.  In spite of history of prior surgery  The left canal partially occluded with cerumen          Assessment & Plan:   Minor left cerumen impaction Probable serous otitis media as a cause of her diminished hearing. Left canal irrigated until clear  Patient instructions discussed and dispensed Follow-up ENT if not improved in a couple of weeks.  Patient already has an appointment with ENT on April 25.  Hopefully symptoms will resolve by then and will be able to cancel  Hypertension, stable

## 2014-04-25 NOTE — Progress Notes (Signed)
Pre visit review using our clinic review tool, if applicable. No additional management support is needed unless otherwise documented below in the visit note. 

## 2014-04-26 ENCOUNTER — Encounter: Payer: Self-pay | Admitting: Cardiology

## 2014-04-26 ENCOUNTER — Ambulatory Visit (INDEPENDENT_AMBULATORY_CARE_PROVIDER_SITE_OTHER): Payer: Medicare Other | Admitting: Cardiology

## 2014-04-26 ENCOUNTER — Telehealth: Payer: Self-pay | Admitting: Family Medicine

## 2014-04-26 VITALS — BP 142/86 | HR 60 | Ht 62.0 in | Wt 125.0 lb

## 2014-04-26 DIAGNOSIS — I480 Paroxysmal atrial fibrillation: Secondary | ICD-10-CM | POA: Diagnosis not present

## 2014-04-26 DIAGNOSIS — I2510 Atherosclerotic heart disease of native coronary artery without angina pectoris: Secondary | ICD-10-CM

## 2014-04-26 DIAGNOSIS — I5032 Chronic diastolic (congestive) heart failure: Secondary | ICD-10-CM

## 2014-04-26 NOTE — Patient Instructions (Signed)
Your physician recommends that you have  lab work today--TSH.   Your physician wants you to follow-up in: 6 months with Dr Aundra Dubin. (September 2016).  You will receive a reminder letter in the mail two months in advance. If you don't receive a letter, please call our office to schedule the follow-up appointment.

## 2014-04-26 NOTE — Telephone Encounter (Signed)
emmi mailed  °

## 2014-04-28 NOTE — Progress Notes (Signed)
Patient ID: Sarah Mays, female   DOB: [birth year], 81 y.o.   MRN: [id number] PCP: Dr. Yong Channel  81 yo with paroxysmal atrial fibrillation and chronic diastolic CHF returns for cardiology evaluation.  She is maintaining NSR on amiodarone. Weight is up 2 lbs.  She reports no exertional dyspnea.  No chest pain, no orthopnea or PND.  No lightheadedness or syncope.  Echo in 10/15 showed normal EF, 60-65%.  She is tolerating Xarelto without melena or BRBPR.  Main complaint is low back pain, MR showed herniated discs.  She will be getting an epidural injection.   ECG: NSR, 1st degree AV block, iRBBB  Labs (7/13): K 4.7, creatinine 0.56 Labs (8/13): K 5.4, creatinine 0.69 Labs (11/13): TSH 6.45, K 5.4=>3.8, creatinine 1.5=>1.2, BUN 70=>35, HCT 33.2, BNP 866, AST 25, ALT 36 Labs (12/13): K 3.5 => 2.8, creatinine 1.2 => 1.33, BUN 56 Labs (1/14): LDL 85, HDL 86, K 3.9, creatinine 1.0, BNP 447 Labs (4/14): K 4.1, creatinine 0.9, BNP 293, LFTs normal, TSH normal Labs (8/14): K 4.3, creatinine 1.2, LFTs normal, TSH normal Labs (12/14): LFTs normal, TSH normal Labs (3/15): K 4.5, creatinine 1.3, HCT 36.2 Labs (10/15): K 5.1, creatinine 0.9, LFTs normal, TSH normal, pBNP 1893 Labs (3/16): K 3.9, creatinine 1.03, HCT 36.9, LFTs normal  PMH: 1. Atrial fibrillation: Diagnosed initially in 10/13.  Holter monitor in 11/13 showed atrial fibrillation with average rate 65.  Back in NSR on amiodarone.   2. Chronic diastolic CHF: Echo (40/98) with EF 65-70%, mild MR, moderate biatrial enlargement, moderate TR, PA systolic pressure 45 mmHg.   Echo (10/15): EF 60-65%.   3. ETT-Sestamibi (11/13): Exercised to stage II, small partially reversible anteroapical perfusion defect suggesting mild ischemia versus attenuation, EF 80%.  4. HTN: Lower extremity swelling with amlodipine.  5. Polycythemia vera: Has had phlebotomy only once.  6. Hypothyroidism 7. Macular degeneration 8. TAH 1980 9. Familial hypocalciuric  hypercalcemia 10. PFTs (amiodarone use) in 1/14 showed a mild obstructive defect.  11. Degenerative disc disease  SH: Prior smoker (years ago).  Married, lived in [place] but now at [place]  Occasional ETOH.  Daughter works for [employer].   FH: CAD  ROS: All systems reviewed and negative except as per HPI.   Current Outpatient Prescriptions  Medication Sig Dispense Refill  . amiodarone (PACERONE) 200 MG tablet Take 1 tablet (200 mg total) by mouth daily. 30 tablet 6  . baclofen (LIORESAL) 10 MG tablet Take 0.5 tablets (5 mg total) by mouth at bedtime. 30 each 0  . Cholecalciferol (VITAMIN D3) 400 UNITS CAPS Take 1 capsule by mouth daily.    . fluticasone (FLONASE) 50 MCG/ACT nasal spray Place 2 sprays into both nostrils daily. 16 g 3  . furosemide (LASIX) 40 MG tablet Take 1.5 tablets (60 mg total) by mouth 2 (two) times daily. 90 tablet 6  . GLUCOSAMINE-CHONDROITIN DS PO Take 1 tablet by mouth daily.     . hydrALAZINE (APRESOLINE) 50 MG tablet Take 1.5 tablets (75 mg total) by mouth 3 (three) times daily. 135 tablet 6  . hydroxyurea (HYDREA) 500 MG capsule TAKE 2 CAPSULES ON MONDAYS AND TAKE 1 CAPSULE ALL OTHER DAYS OF THE WEEK. (Patient taking differently: TAKE 2 CAPSULES BY MOUTH ON MONDAYS AND TAKE 1 CAPSULE BY MOUTH ALL OTHER DAYS OF THE WEEK.) 35 capsule 6  . ibandronate (BONIVA) 150 MG tablet TAKE ONE TABLET ONCE A MONTH IN THE MORNING ON AN EMPTY STOMACH WITH WATER. REMAIN UPRIGHT. 1 tablet  6  . levothyroxine (SYNTHROID, LEVOTHROID) 112 MCG tablet Take 112 mcg by mouth daily before breakfast.    . potassium chloride SA (K-DUR,KLOR-CON) 20 MEQ tablet Take 1 tablet (20 mEq total) by mouth daily. 30 tablet 6  . traMADol (ULTRAM) 50 MG tablet Take 1 tablet (50 mg total) by mouth at bedtime as needed. 30 tablet 0  . XARELTO 15 MG TABS tablet TAKE 1 TABLET ONCE DAILY. 30 tablet 3   No current facility-administered medications for this visit.    BP 142/86 mmHg  Pulse 60  Ht 5'  2" (1.575 m)  Wt 125 lb (56.7 kg)  BMI 22.86 kg/m2 General: NAD Neck: JVP 7 cm, no thyromegaly or thyroid nodule.  Lungs: CTAB CV: Nondisplaced PMI.  Heart regular S1/S2, no S3/S4, 1/6 early SEM.  No edema.  No carotid bruit.  Abdomen: Soft, nontender, no hepatosplenomegaly, no distention.  Neurologic: Alert and oriented x 3.  Psych: Normal affect. Extremities: No clubbing or cyanosis.   Assessment/Plan:  1. Atrial fibrillation: First noted in 10/13.  Atrial fibrillation has triggered acute on chronic diastolic CHF in the past.  CHADSVASC score is 65 (age, female gender, HTN, CHF).  She is anticoagulated with Xarelto 15 mg daily.  She is off nebivolol due to bradycardia.  Maintaining NSR with amiodarone.  - Recent LFTs normal, will check TSH today.  Given amiodarone use, she will need yearly eye exams.  Baseline PFTs showed a mild obstructive defect.  2. CAD: Patient has not had any chest pain. She had an ETT-Sestamibi in 11/13 that showed a small partially reversible anteroapical perfusion defect.  This may be a small area of ischemia but soft tissue attenuation is certainly also possible.  This does not sound like a high risk study.  Would continue to manage medically.  3. Chronic diastolic CHF: Volume status improved on higher Lasix.  NYHA class II symptoms.  -Continue Lasix 60 mg bid.  Recent BMET stable.   4. HTN: BP controlled on current regimen.   5. Bradycardia: HR is higher off nebivolol, will leave off.  Suspect there is a component of tachy-brady syndrome, may need PCM in future.   Followup 6 months  Loralie Champagne 04/28/2014

## 2014-04-30 ENCOUNTER — Telehealth: Payer: Self-pay | Admitting: Cardiology

## 2014-04-30 NOTE — Telephone Encounter (Signed)
Pt states she woke up this morning and noticed her feet and ankles were swollen.  She denies any change in breathing, she does not weigh regularly, she was able to get her shoes on today but they were tight and had been loose when she wore them Sunday.   Pt states she is taking tramadol for pain and baclofen a muscle relaxant, these are the only recent medication changes. She is scheduled for steroid injection in her back on Friday.  Pt advised I will forward to Dr Aundra Dubin for review.

## 2014-04-30 NOTE — Telephone Encounter (Signed)
For now, use compression stockings and continue current Lasix.  No changes.

## 2014-04-30 NOTE — Telephone Encounter (Signed)
New message     Patient calling  C/O both feet are swelling.

## 2014-04-30 NOTE — Telephone Encounter (Signed)
Pt advised,verbalized understanding. 

## 2014-05-01 ENCOUNTER — Telehealth: Payer: Self-pay | Admitting: Cardiology

## 2014-05-01 NOTE — Telephone Encounter (Signed)
New message     Talk to a nurse regarding getting compression stockings

## 2014-05-01 NOTE — Telephone Encounter (Signed)
Patient's has herniated disc in her back, is receiving epidural injections. She is unable to pull on any kind of sock, much less a compression stocking. She has no one to assist her with this. She wants MD/Nurse to be aware.

## 2014-05-02 ENCOUNTER — Other Ambulatory Visit: Payer: Self-pay | Admitting: *Deleted

## 2014-05-02 MED ORDER — TRAMADOL HCL 50 MG PO TABS: 50.0000 mg | ORAL_TABLET | Freq: Four times a day (QID) | ORAL | Status: DC | PRN

## 2014-05-02 NOTE — Telephone Encounter (Signed)
Refill done.  

## 2014-05-03 ENCOUNTER — Ambulatory Visit
Admission: RE | Admit: 2014-05-03 | Discharge: 2014-05-03 | Disposition: A | Discharge status: Home / Self Care | Payer: Medicare Other | Source: Ambulatory Visit | Attending: Family Medicine | Admitting: Family Medicine

## 2014-05-03 DIAGNOSIS — M5416 Radiculopathy, lumbar region: Secondary | ICD-10-CM

## 2014-05-03 DIAGNOSIS — M4727 Other spondylosis with radiculopathy, lumbosacral region: Secondary | ICD-10-CM | POA: Diagnosis not present

## 2014-05-03 DIAGNOSIS — G544 Lumbosacral root disorders, not elsewhere classified: Secondary | ICD-10-CM | POA: Diagnosis not present

## 2014-05-03 DIAGNOSIS — M4807 Spinal stenosis, lumbosacral region: Secondary | ICD-10-CM | POA: Diagnosis not present

## 2014-05-03 MED ORDER — IOHEXOL 180 MG/ML  SOLN
1.0000 mL | Freq: Once | INTRAMUSCULAR | Status: AC | PRN
  Administered 2014-05-03: 1 mL via EPIDURAL

## 2014-05-03 MED ORDER — METHYLPREDNISOLONE ACETATE 40 MG/ML INJ SUSP (RADIOLOG
120.0000 mg | Freq: Once | INTRAMUSCULAR | Status: AC
  Administered 2014-05-03: 120 mg via EPIDURAL

## 2014-05-03 NOTE — Discharge Instructions (Signed)

## 2014-05-09 ENCOUNTER — Other Ambulatory Visit (HOSPITAL_BASED_OUTPATIENT_CLINIC_OR_DEPARTMENT_OTHER): Payer: Medicare Other

## 2014-05-09 ENCOUNTER — Telehealth: Payer: Self-pay | Admitting: Hematology and Oncology

## 2014-05-09 ENCOUNTER — Ambulatory Visit (HOSPITAL_BASED_OUTPATIENT_CLINIC_OR_DEPARTMENT_OTHER): Payer: Medicare Other | Admitting: Hematology and Oncology

## 2014-05-09 ENCOUNTER — Encounter: Payer: Self-pay | Admitting: Hematology and Oncology

## 2014-05-09 VITALS — BP 124/76 | HR 58 | Temp 98.0°F | Resp 18 | Ht 62.0 in | Wt 120.9 lb

## 2014-05-09 DIAGNOSIS — I482 Chronic atrial fibrillation, unspecified: Secondary | ICD-10-CM

## 2014-05-09 DIAGNOSIS — D45 Polycythemia vera: Secondary | ICD-10-CM | POA: Diagnosis not present

## 2014-05-09 DIAGNOSIS — M810 Age-related osteoporosis without current pathological fracture: Secondary | ICD-10-CM | POA: Diagnosis not present

## 2014-05-09 LAB — CBC WITH DIFFERENTIAL/PLATELET
BASO%: 0.9 % (ref 0.0–2.0)
Basophils Absolute: 0.1 10e3/uL (ref 0.0–0.1)
EOS%: 1 % (ref 0.0–7.0)
Eosinophils Absolute: 0.1 10e3/uL (ref 0.0–0.5)
HCT: 38.4 % (ref 34.8–46.6)
HGB: 12.5 g/dL (ref 11.6–15.9)
LYMPH%: 10.9 % — ABNORMAL LOW (ref 14.0–49.7)
MCH: 36.4 pg — ABNORMAL HIGH (ref 25.1–34.0)
MCHC: 32.6 g/dL (ref 31.5–36.0)
MCV: 111.9 fL — ABNORMAL HIGH (ref 79.5–101.0)
MONO#: 0.9 10e3/uL (ref 0.1–0.9)
MONO%: 11.5 % (ref 0.0–14.0)
NEUT#: 6.1 10e3/uL (ref 1.5–6.5)
NEUT%: 75.7 % (ref 38.4–76.8)
Platelets: 349 10e3/uL (ref 145–400)
RBC: 3.43 10e6/uL — ABNORMAL LOW (ref 3.70–5.45)
RDW: 16 % — ABNORMAL HIGH (ref 11.2–14.5)
WBC: 8.1 10e3/uL (ref 3.9–10.3)
lymph#: 0.9 10e3/uL (ref 0.9–3.3)

## 2014-05-09 NOTE — Assessment & Plan Note (Signed)
She have chronic history of osteoporosis and have recent back pain due to disc injury. I will defer to primary care provider for management.

## 2014-05-09 NOTE — Assessment & Plan Note (Signed)
Examination today revealed that she is rate controlled. Clinically, she is euvolemic with no signs of congestive heart failure She will continue on anticoagulation therapy. She denies bleeding complications.

## 2014-05-09 NOTE — Telephone Encounter (Signed)
Pt confirmed labs/ov per 03/31 POF, gave pt AVS and Calendar.... KJ  °

## 2014-05-09 NOTE — Progress Notes (Signed)
[facility] OFFICE PROGRESS NOTE  Patient Care Team: Marin Olp, MD as PCP - General (Family Medicine) [place] Jeri Cos, NP as Nurse Practitioner (Geriatric Medicine) Hurman Horn, MD as Consulting Physician (Ophthalmology) Larey Dresser, MD as Consulting Physician (Cardiology) Heath Lark, MD as Consulting Physician (Hematology and Oncology)  SUMMARY OF ONCOLOGIC HISTORY:   Polycythemia vera   08/05/2011 Pathology Results Peripheral blood JAK2 mutation was positive with low serum erythropoietin level. Bone marrow aspirate and biopsy was not performed.   08/12/2011 -  Chemotherapy She is started on hydroxyurea with periodic phlebotomy.    INTERVAL HISTORY: Please see below for problem oriented charting. She is doing well on current treatment and dose of hydroxyurea. Denies recent infection. She continues on chronic anticoagulation therapy under the care of her cardiologist. The patient denies any recent signs or symptoms of bleeding such as spontaneous epistaxis, hematuria or hematochezia. She injured her back recently and had received injection to her back.  REVIEW OF SYSTEMS:   Constitutional: Denies fevers, chills or abnormal weight loss Eyes: Denies blurriness of vision Ears, nose, mouth, throat, and face: Denies mucositis or sore throat Respiratory: Denies cough, dyspnea or wheezes Cardiovascular: Denies palpitation, chest discomfort or lower extremity swelling Gastrointestinal:  Denies nausea, heartburn or change in bowel habits Skin: Denies abnormal skin rashes Lymphatics: Denies new lymphadenopathy or easy bruising Neurological:Denies numbness, tingling or new weaknesses Behavioral/Psych: Mood is stable, no new changes  All other systems were reviewed with the patient and are negative.  I have reviewed the past medical history, past surgical history, social history and family history with the patient and they are  unchanged from previous note.  ALLERGIES:  has No Known Allergies.  MEDICATIONS:  Current Outpatient Prescriptions  Medication Sig Dispense Refill  . amiodarone (PACERONE) 200 MG tablet Take 1 tablet (200 mg total) by mouth daily. 30 tablet 6  . baclofen (LIORESAL) 10 MG tablet Take 0.5 tablets (5 mg total) by mouth at bedtime. 30 each 0  . Cholecalciferol (VITAMIN D3) 400 UNITS CAPS Take 1 capsule by mouth daily.    . fluticasone (FLONASE) 50 MCG/ACT nasal spray Place 2 sprays into both nostrils daily. 16 g 3  . furosemide (LASIX) 40 MG tablet Take 1.5 tablets (60 mg total) by mouth 2 (two) times daily. 90 tablet 6  . GLUCOSAMINE-CHONDROITIN DS PO Take 1 tablet by mouth daily.     . hydrALAZINE (APRESOLINE) 50 MG tablet Take 1.5 tablets (75 mg total) by mouth 3 (three) times daily. 135 tablet 6  . hydroxyurea (HYDREA) 500 MG capsule TAKE 2 CAPSULES ON MONDAYS AND TAKE 1 CAPSULE ALL OTHER DAYS OF THE WEEK. (Patient taking differently: TAKE 2 CAPSULES BY MOUTH ON MONDAYS AND TAKE 1 CAPSULE BY MOUTH ALL OTHER DAYS OF THE WEEK.) 35 capsule 6  . ibandronate (BONIVA) 150 MG tablet TAKE ONE TABLET ONCE A MONTH IN THE MORNING ON AN EMPTY STOMACH WITH WATER. REMAIN UPRIGHT. 1 tablet 6  . levothyroxine (SYNTHROID, LEVOTHROID) 112 MCG tablet Take 112 mcg by mouth daily before breakfast.    . potassium chloride SA (K-DUR,KLOR-CON) 20 MEQ tablet Take 1 tablet (20 mEq total) by mouth daily. 30 tablet 6  . traMADol (ULTRAM) 50 MG tablet Take 1 tablet (50 mg total) by mouth every 6 (six) hours as needed. 30 tablet 0  . XARELTO 15 MG TABS tablet TAKE 1 TABLET ONCE DAILY. 30 tablet 3   No current facility-administered medications for  this visit.    PHYSICAL EXAMINATION: ECOG PERFORMANCE STATUS: 0 - Asymptomatic  Filed Vitals:   05/09/14 1314  BP: 124/76  Pulse: 58  Temp: 98 F (36.7 C)  Resp: 18   Filed Weights   05/09/14 1314  Weight: 120 lb 14.4 oz (54.84 kg)    GENERAL:alert, no distress  and comfortable SKIN: skin color, texture, turgor are normal, no rashes or significant lesions EYES: normal, Conjunctiva are pink and non-injected, sclera clear OROPHARYNX:no exudate, no erythema and lips, buccal mucosa, and tongue normal  NECK: supple, thyroid normal size, non-tender, without nodularity LYMPH:  no palpable lymphadenopathy in the cervical, axillary or inguinal LUNGS: clear to auscultation and percussion with normal breathing effort HEART: regular rate & rhythm and no murmurs and no lower extremity edema ABDOMEN:abdomen soft, non-tender and normal bowel sounds Musculoskeletal:no cyanosis of digits and no clubbing  NEURO: alert & oriented x 3 with fluent speech, no focal motor/sensory deficits  LABORATORY DATA:  I have reviewed the data as listed    Component Value Date/Time   NA 134* 04/15/2014 1530   NA 131* 08/06/2011   K 3.9 04/15/2014 1530   CL 95* 04/15/2014 1530   CO2 35* 04/15/2014 1530   GLUCOSE 92 04/15/2014 1530   BUN 18 04/15/2014 1530   BUN 19 08/06/2011   CREATININE 1.03 04/15/2014 1530   CREATININE 1.33* 02/08/2012 1624   CREATININE 0.7 08/06/2011   CALCIUM 10.3 04/15/2014 1530   PROT 6.9 04/15/2014 1530   ALBUMIN 4.1 04/15/2014 1530   AST 13 04/15/2014 1530   ALT 15 04/15/2014 1530   ALKPHOS 103 04/15/2014 1530   BILITOT 0.4 04/15/2014 1530    No results found for: SPEP, UPEP  Lab Results  Component Value Date   WBC 8.1 05/09/2014   NEUTROABS 6.1 05/09/2014   HGB 12.5 05/09/2014   HCT 38.4 05/09/2014   MCV 111.9* 05/09/2014   PLT 349 05/09/2014      Chemistry      Component Value Date/Time   NA 134* 04/15/2014 1530   NA 131* 08/06/2011   K 3.9 04/15/2014 1530   CL 95* 04/15/2014 1530   CO2 35* 04/15/2014 1530   BUN 18 04/15/2014 1530   BUN 19 08/06/2011   CREATININE 1.03 04/15/2014 1530   CREATININE 1.33* 02/08/2012 1624   CREATININE 0.7 08/06/2011   GLU 100 08/06/2011      Component Value Date/Time   CALCIUM 10.3  04/15/2014 1530   ALKPHOS 103 04/15/2014 1530   AST 13 04/15/2014 1530   ALT 15 04/15/2014 1530   BILITOT 0.4 04/15/2014 1530     ASSESSMENT & PLAN:  Polycythemia vera She is doing well with hydroxyurea 500 mg daily every day except on Mondays she takes 2 tablets. She tolerated treatment very well. The patient will continue on her anticoagulation therapy to prevent risk of thrombosis. I will lengthen her visit to 6 months now    Chronic atrial fibrillation Examination today revealed that she is rate controlled. Clinically, she is euvolemic with no signs of congestive heart failure She will continue on anticoagulation therapy. She denies bleeding complications.     Osteoporosis She have chronic history of osteoporosis and have recent back pain due to disc injury. I will defer to primary care provider for management.    Orders Placed This Encounter  Procedures  . CBC with Differential/Platelet    Standing Status: Future     Number of Occurrences:      Standing Expiration Date:  06/13/2015   All questions were answered. The patient knows to call the clinic with any problems, questions or concerns. No barriers to learning was detected. I spent 15 minutes counseling the patient face to face. The total time spent in the appointment was 20 minutes and more than 50% was on counseling and review of test results     [facility], [place], MD 05/09/2014 1:54 PM

## 2014-05-09 NOTE — Assessment & Plan Note (Signed)
She is doing well with hydroxyurea 500 mg daily every day except on Mondays she takes 2 tablets. She tolerated treatment very well. The patient will continue on her anticoagulation therapy to prevent risk of thrombosis. I will lengthen her visit to 6 months now

## 2014-05-10 ENCOUNTER — Encounter: Payer: Self-pay | Admitting: Family Medicine

## 2014-05-10 ENCOUNTER — Ambulatory Visit (INDEPENDENT_AMBULATORY_CARE_PROVIDER_SITE_OTHER): Payer: Medicare Other | Admitting: Family Medicine

## 2014-05-10 VITALS — BP 140/70 | HR 67 | Temp 98.2°F | Wt 116.0 lb

## 2014-05-10 DIAGNOSIS — E038 Other specified hypothyroidism: Secondary | ICD-10-CM | POA: Diagnosis not present

## 2014-05-10 DIAGNOSIS — E034 Atrophy of thyroid (acquired): Secondary | ICD-10-CM

## 2014-05-10 DIAGNOSIS — I10 Essential (primary) hypertension: Secondary | ICD-10-CM | POA: Diagnosis not present

## 2014-05-10 NOTE — Assessment & Plan Note (Signed)
TSh recently collected but no result at cardiology office. Will reach out to office to see what issue is. Since she has had almost complete bloodwork within the month we held off today and will repeat in 6 months.

## 2014-05-10 NOTE — Progress Notes (Signed)
Garret Reddish, MD Phone: [phone]  Subjective:   Sarah Mays is a 81 y.o. year old very pleasant female patient who presents with the following:  Hypertension-controlled  BP Readings from Last 3 Encounters:  05/10/14 140/70  05/09/14 124/76  05/03/14 106/67   Home BP monitoring-no Compliant with medications-yes without side effects ROS-Denies any CP, HA, SOB, blurry vision, LE edema. Low back pain leading her to use walker-followed by Dr. Tamala Julian.   Hypercalcemia- no longer hypercalcemic -intermittent issues in the past. Has had workup by endocrine-still unclear who did this. Last calcium in normal range.  ROS- no depression, CVA tenderness, diffuse pain other than in back  Hypothyroidism-controlled previously  Lab Results  Component Value Date   TSH 0.87 11/19/2013   On thyroid medication-levothyroxine 112 mcg ROS-No hair or nail changes. No heat/cold intolerance. No constipation or diarrhea. Denies shakiness or anxiety. Denies palpitations.    Past Medical History- Patient Active Problem List   Diagnosis Date Noted  . Polycythemia vera     Priority: High  . Sick sinus syndrome 03/09/2012    Priority: High  . CAD (coronary artery disease) 12/27/2011    Priority: High  . Chronic atrial fibrillation 12/27/2011    Priority: High  . Chronic diastolic CHF (congestive heart failure) 12/27/2011    Priority: High  . Osteoporosis 01/04/2014    Priority: Medium  . Hypothyroidism 11/19/2013    Priority: Medium  . Hypercalcemia 10/11/2013    Priority: Medium  . Insomnia 05/16/2013    Priority: Medium  . Senile osteoporosis     Priority: Medium  . Macular degeneration     Priority: Medium  . HTN (hypertension) 12/27/2011    Priority: Medium  . Gout 01/04/2014    Priority: Low  . Headache 01/04/2014    Priority: Low  . Back pain 05/16/2013    Priority: Low  . Neck mass     Priority: Low  . AKI (acute kidney injury) 12/27/2011    Priority: Low    Medications- reviewed and updated Current Outpatient Prescriptions  Medication Sig Dispense Refill  . amiodarone (PACERONE) 200 MG tablet Take 1 tablet (200 mg total) by mouth daily. 30 tablet 6  . baclofen (LIORESAL) 10 MG tablet Take 0.5 tablets (5 mg total) by mouth at bedtime. 30 each 0  . Cholecalciferol (VITAMIN D3) 400 UNITS CAPS Take 1 capsule by mouth daily.    . furosemide (LASIX) 40 MG tablet Take 1.5 tablets (60 mg total) by mouth 2 (two) times daily. 90 tablet 6  . GLUCOSAMINE-CHONDROITIN DS PO Take 1 tablet by mouth daily.     . hydrALAZINE (APRESOLINE) 50 MG tablet Take 1.5 tablets (75 mg total) by mouth 3 (three) times daily. 135 tablet 6  . hydroxyurea (HYDREA) 500 MG capsule TAKE 2 CAPSULES ON MONDAYS AND TAKE 1 CAPSULE ALL OTHER DAYS OF THE WEEK. (Patient taking differently: TAKE 2 CAPSULES BY MOUTH ON MONDAYS AND TAKE 1 CAPSULE BY MOUTH ALL OTHER DAYS OF THE WEEK.) 35 capsule 6  . ibandronate (BONIVA) 150 MG tablet TAKE ONE TABLET ONCE A MONTH IN THE MORNING ON AN EMPTY STOMACH WITH WATER. REMAIN UPRIGHT. 1 tablet 6  . levothyroxine (SYNTHROID, LEVOTHROID) 112 MCG tablet Take 112 mcg by mouth daily before breakfast.    . potassium chloride SA (K-DUR,KLOR-CON) 20 MEQ tablet Take 1 tablet (20 mEq total) by mouth daily. 30 tablet 6  . XARELTO 15 MG TABS tablet TAKE 1 TABLET ONCE DAILY. 30 tablet 3  .  fluticasone (FLONASE) 50 MCG/ACT nasal spray Place 2 sprays into both nostrils daily. (Patient not taking: Reported on 05/10/2014) 16 g 3  . traMADol (ULTRAM) 50 MG tablet Take 1 tablet (50 mg total) by mouth every 6 (six) hours as needed. (Patient not taking: Reported on 05/10/2014) 30 tablet 0   No current facility-administered medications for this visit.    Objective: BP 140/70 mmHg  Pulse 67  Temp(Src) 98.2 F (36.8 C)  Wt 116 lb (52.617 kg) Gen: NAD, resting comfortably, now walking with walker CV: irregularly, irregular,  no murmurs rubs or gallops Lungs: CTAB no  crackles, wheeze, rhonchi Abdomen: soft/nontender/nondistended/normal bowel sounds. No rebound or guarding.  Ext: no edema Skin: warm, dry, no rash Neuro: grossly normal, moves all extremities, no leg weakness   Assessment/Plan:  HTN (hypertension) Very mild poor systolic control. Controlled last 2 readings. Make no changes. Continue Lasix 40mg  BID, bystolic 2.5mg , hdralazine 50mg  TID.   Hypercalcemia Last set of labs show normalized calcium. We will check this every 6 months. See prior note for planned workup if persistently elevated. Avoid hctz   Hypothyroidism TSh recently collected but no result at cardiology office. Will reach out to office to see what issue is. Since she has had almost complete bloodwork within the month we held off today and will repeat in 6 months.    6 month follow up

## 2014-05-10 NOTE — Patient Instructions (Signed)
Glad you are doing a little better as far as the back. I have no medication change recommendations today.   Let's check in 6 months from now.   If you don't mind, bring a copy of your living will or just look it over and let me know what your wishes are so we can document them.

## 2014-05-10 NOTE — Assessment & Plan Note (Signed)
Very mild poor systolic control. Controlled last 2 readings. Make no changes. Continue Lasix 40mg  BID, bystolic 2.5mg , hdralazine 50mg  TID.

## 2014-05-10 NOTE — Assessment & Plan Note (Signed)
Last set of labs show normalized calcium. We will check this every 6 months. See prior note for planned workup if persistently elevated. Avoid hctz

## 2014-05-14 ENCOUNTER — Encounter: Payer: Self-pay | Admitting: Family Medicine

## 2014-05-14 ENCOUNTER — Ambulatory Visit (INDEPENDENT_AMBULATORY_CARE_PROVIDER_SITE_OTHER): Payer: Medicare Other | Admitting: Family Medicine

## 2014-05-14 VITALS — BP 110/80 | HR 67 | Ht 62.0 in | Wt 117.0 lb

## 2014-05-14 DIAGNOSIS — M545 Low back pain, unspecified: Secondary | ICD-10-CM

## 2014-05-14 DIAGNOSIS — I2510 Atherosclerotic heart disease of native coronary artery without angina pectoris: Secondary | ICD-10-CM | POA: Diagnosis not present

## 2014-05-14 MED ORDER — GABAPENTIN 100 MG PO CAPS: 100.0000 mg | ORAL_CAPSULE | Freq: Every day | ORAL | Status: DC

## 2014-05-14 NOTE — Progress Notes (Signed)
  [name] [address] [place], [place] [address] Phone: [phone] Subjective:     CC: back pain follow up  [id number] Sarah Mays is a 81 y.o. female coming in with complaint of back pain. Patient is a past medical history for severe osteoporosis. Patient had significant amount of degenerative changes of the lumbar spine and did have 2 epidural steroid injections as well as facet injection over the course last month. Patient's last injection was 05/03/2014.  Patient states that she is a proximal a 50% better. Patient states that she is having no radicular symptoms at this time. This stopped after the first injection. Patient still is walking with the aid of a scooter. Patient though states that there is no numbness or weakness of the legs. Still having a dull throbbing aching pain of the lumbar spine at all times. Still unable to start doing the activities. Patient has noticed the tramadol is helpful but she is trying to decrease the amount to 1-2 times daily.   Past medical history, social, surgical and family history all reviewed in electronic medical record.   Medication-wise patient is taking Boniva for the osteoporosis as well as tramadol and baclofen for the pain.  Patient's MRI on 04/15/2014 shows advanced osteophytic changes along by her spine as well as a bulging degenerative annular with flattening of the ventral thecal sac at L4-L5 on the left at L3-L4 affecting L3 and L4 nerve roots.  Review of Systems: No headache, visual changes, nausea, vomiting, diarrhea, constipation, dizziness, abdominal pain, skin rash, fevers, chills, night sweats, weight loss, swollen lymph nodes, body aches, joint swelling, muscle aches, chest pain, shortness of breath, mood changes.   Objective Blood pressure 110/80, pulse 67, height 5\' 2"  (1.575 m), weight 117 lb (53.071 kg), SpO2 96 %.  General: No apparent distress alert and oriented x3 mood and affect  normal, dressed appropriately.  HEENT: Pupils equal, extraocular movements intact  Respiratory: Patient's speak in full sentences and does not appear short of breath  Cardiovascular: No lower extremity edema, non tender, no erythema  Skin: Warm dry intact with no signs of infection or rash on extremities or on axial skeleton.  Abdomen: Soft nontender  Neuro: Cranial nerves II through XII are intact, neurovascularly intact in all extremities with 2+ DTRs and 2+ pulses.  Lymph: No lymphadenopathy of posterior or anterior cervical chain or axillae bilaterally.  Gait normal with good balance and coordination.  MSK:  Non tender with full range of motion and good stability and symmetric strength and tone of shoulders, elbows, wrist, hip, knee and ankles bilaterally.  Back Exam:  Inspection: Unremarkable  Motion: Flexion 35 deg, Extension 35 deg, Side Bending to 30 deg bilaterally,  Rotation to 35 deg bilaterally  SLR laying: Negative  XSLR laying: Negative  Palpable tenderness: Mild tenderness to palpation over the paraspinal musculature but significantly improved from previous exam. No spinous process tenderness. FABER: Continue positive right Sensory change: Gross sensation intact to all lumbar and sacral dermatomes.  Reflexes: 2+ at both patellar tendons, 2+ at achilles tendons, Babinski's downgoing.  Strength at foot  Plantar-flexion: 5/5 Dorsi-flexion: 5/5 Eversion: 5/5 Inversion: 5/5  Leg strength  Quad: 5/5 Hamstring: 4/5 Hip flexor: 4/5 Hip abductors: 4/5 which is an improvement Gait unremarkable.     Impression and Recommendations:     This case required medical decision making of moderate complexity.

## 2014-05-14 NOTE — Patient Instructions (Addendum)
Good to see you Call me Tuesday or Wednesday next week. If still in pain will order one more shot Gabapentin 100mg  at night Continue the tramadol when you need it Add tylenol 325 mg 3 times daily See me in 3 weeks and we will discuss physical therapy.

## 2014-05-14 NOTE — Progress Notes (Signed)
Pre visit review using our clinic review tool, if applicable. No additional management support is needed unless otherwise documented below in the visit note. 

## 2014-05-14 NOTE — Assessment & Plan Note (Signed)
Patient did respond to the second epidural and I would like to avoid a third one if possible. Patient is going to try to make it over the weekend and give [place] a call on Tuesday or Wednesday. At that time if any worsening pain we may need to consider a third epidural steroid injection. I do not feel that patient is able to tolerate formal physical therapy at this point but woefully she will in the next 2-3 weeks. Encourage patient to continue the icing and patient was started on gabapentin at a low dose. We will see if patient can get off the muscle relaxer as well as the tramadol in the long run and we discussed doing Tylenol on a regular basis. Patient will make these different changes and come back and see me again in 3 weeks for further evaluation.  Spent  25 minutes with patient face-to-face and had greater than 50% of counseling including as described above in assessment and plan.

## 2014-05-15 ENCOUNTER — Other Ambulatory Visit (INDEPENDENT_AMBULATORY_CARE_PROVIDER_SITE_OTHER): Payer: Medicare Other | Admitting: *Deleted

## 2014-05-15 DIAGNOSIS — E034 Atrophy of thyroid (acquired): Secondary | ICD-10-CM | POA: Diagnosis not present

## 2014-05-15 DIAGNOSIS — E038 Other specified hypothyroidism: Secondary | ICD-10-CM

## 2014-05-15 LAB — TSH: TSH: 1.1 u[IU]/mL (ref 0.35–4.50)

## 2014-05-15 NOTE — Addendum Note (Signed)
Addended by: Eulis Foster on: 05/15/2014 03:40 PM   Modules accepted: Orders

## 2014-05-16 DIAGNOSIS — H3532 Exudative age-related macular degeneration: Secondary | ICD-10-CM | POA: Diagnosis not present

## 2014-05-21 ENCOUNTER — Telehealth: Payer: Self-pay | Admitting: Family Medicine

## 2014-05-21 DIAGNOSIS — H6522 Chronic serous otitis media, left ear: Secondary | ICD-10-CM | POA: Diagnosis not present

## 2014-05-21 DIAGNOSIS — H6983 Other specified disorders of Eustachian tube, bilateral: Secondary | ICD-10-CM | POA: Diagnosis not present

## 2014-05-21 DIAGNOSIS — H6122 Impacted cerumen, left ear: Secondary | ICD-10-CM | POA: Diagnosis not present

## 2014-05-21 DIAGNOSIS — M5416 Radiculopathy, lumbar region: Secondary | ICD-10-CM

## 2014-05-21 DIAGNOSIS — D170 Benign lipomatous neoplasm of skin and subcutaneous tissue of head, face and neck: Secondary | ICD-10-CM | POA: Diagnosis not present

## 2014-05-21 MED ORDER — TRAMADOL HCL 50 MG PO TABS: 50.0000 mg | ORAL_TABLET | Freq: Four times a day (QID) | ORAL | Status: DC | PRN

## 2014-05-21 NOTE — Telephone Encounter (Signed)
Patient was told to call Dr. Tamala Julian today and she wants to talk to you about her traMADol (ULTRAM) 50 MG tablet [[id number].

## 2014-05-21 NOTE — Telephone Encounter (Signed)
Spoke to pt, she is requesting to have another epidural injection done & a refill on tramadol.  Order entered & tramadol sent into pharmacy.

## 2014-05-27 ENCOUNTER — Other Ambulatory Visit: Payer: Self-pay | Admitting: Family Medicine

## 2014-05-27 ENCOUNTER — Ambulatory Visit
Admission: RE | Admit: 2014-05-27 | Discharge: 2014-05-27 | Disposition: A | Discharge status: Home / Self Care | Payer: Medicare Other | Source: Ambulatory Visit | Attending: Family Medicine | Admitting: Family Medicine

## 2014-05-27 VITALS — BP 167/83 | HR 75

## 2014-05-27 DIAGNOSIS — M5416 Radiculopathy, lumbar region: Secondary | ICD-10-CM

## 2014-05-27 DIAGNOSIS — G544 Lumbosacral root disorders, not elsewhere classified: Secondary | ICD-10-CM | POA: Diagnosis not present

## 2014-05-27 DIAGNOSIS — M47817 Spondylosis without myelopathy or radiculopathy, lumbosacral region: Secondary | ICD-10-CM | POA: Diagnosis not present

## 2014-05-27 DIAGNOSIS — M4727 Other spondylosis with radiculopathy, lumbosacral region: Secondary | ICD-10-CM | POA: Diagnosis not present

## 2014-05-27 DIAGNOSIS — M109 Gout, unspecified: Secondary | ICD-10-CM

## 2014-05-27 MED ORDER — METHYLPREDNISOLONE ACETATE 40 MG/ML INJ SUSP (RADIOLOG
120.0000 mg | Freq: Once | INTRAMUSCULAR | Status: AC
  Administered 2014-05-27: 120 mg via EPIDURAL

## 2014-05-27 MED ORDER — IOHEXOL 180 MG/ML  SOLN
1.0000 mL | Freq: Once | INTRAMUSCULAR | Status: AC | PRN
  Administered 2014-05-27: 1 mL via INTRAVENOUS

## 2014-05-27 NOTE — Discharge Instructions (Signed)

## 2014-06-06 ENCOUNTER — Other Ambulatory Visit: Payer: Self-pay | Admitting: *Deleted

## 2014-06-06 DIAGNOSIS — M5416 Radiculopathy, lumbar region: Secondary | ICD-10-CM

## 2014-06-10 ENCOUNTER — Telehealth: Payer: Self-pay | Admitting: Family Medicine

## 2014-06-10 MED ORDER — PREDNISONE 50 MG PO TABS: ORAL_TABLET | ORAL | Status: DC

## 2014-06-10 NOTE — Telephone Encounter (Signed)
Been on tramadol and has been constipated.  She would like to know what she can do.

## 2014-06-10 NOTE — Telephone Encounter (Signed)
Tell her colace 100mg  daily with 17 grams of miralax daily.

## 2014-06-10 NOTE — Telephone Encounter (Signed)
Pt would like to try prednisone. rx sent into pharmacy per dr Tamala Julian.

## 2014-06-13 ENCOUNTER — Telehealth: Payer: Self-pay | Admitting: Family Medicine

## 2014-06-13 MED ORDER — TRAMADOL HCL 50 MG PO TABS: 50.0000 mg | ORAL_TABLET | Freq: Four times a day (QID) | ORAL | Status: DC | PRN

## 2014-06-13 NOTE — Telephone Encounter (Signed)
Discussed with pt

## 2014-06-13 NOTE — Telephone Encounter (Signed)
Refill done.  

## 2014-06-13 NOTE — Telephone Encounter (Signed)
Patient needs refill for traMADol (ULTRAM) 50 MG tablet [[id number]. Pharmacy is [facility]

## 2014-06-13 NOTE — Telephone Encounter (Signed)
Gustavo called in and is requesting a call back from you.  Wouldn't tell me what she was needing   [phone]

## 2014-06-14 DIAGNOSIS — M545 Low back pain: Secondary | ICD-10-CM | POA: Diagnosis not present

## 2014-06-14 DIAGNOSIS — M47816 Spondylosis without myelopathy or radiculopathy, lumbar region: Secondary | ICD-10-CM | POA: Diagnosis not present

## 2014-06-17 DIAGNOSIS — M47816 Spondylosis without myelopathy or radiculopathy, lumbar region: Secondary | ICD-10-CM | POA: Diagnosis not present

## 2014-06-19 ENCOUNTER — Telehealth: Payer: Self-pay | Admitting: Family Medicine

## 2014-06-19 NOTE — Telephone Encounter (Signed)
Pt called in and has a few questions about coming off a med.  She needs to know how she should do it?    Best number [phone]

## 2014-06-19 NOTE — Telephone Encounter (Signed)
Spoke to pt & advised her to stop taking the prednisone.

## 2014-06-20 ENCOUNTER — Telehealth: Payer: Self-pay | Admitting: Cardiology

## 2014-06-20 ENCOUNTER — Telehealth: Payer: Self-pay | Admitting: Family Medicine

## 2014-06-20 DIAGNOSIS — M47816 Spondylosis without myelopathy or radiculopathy, lumbar region: Secondary | ICD-10-CM | POA: Diagnosis not present

## 2014-06-20 NOTE — Telephone Encounter (Signed)
New Message   Patients nurse @ [facility] needs to get patient in for an earlier appt time and day with Dr. Aundra Dubin. I offered her a PA/NP but was not successful in scheduling , please give patient a call back to help them to schedule an appt.

## 2014-06-20 NOTE — Telephone Encounter (Signed)
Form faxed to [place] from [facility], faxed over last OV and med list.

## 2014-06-20 NOTE — Telephone Encounter (Signed)
Sarah Mays from [facility] Pt c/o lower back pain 10/10 which is causing her to be limited to function independently on her own and her BP is elevated due to pain and her feet her swollen and pt thinks this is due to the Prednisone. [name] also states if you want pt to have labs drawn write an order and they will draw them, they are wanting an order for pt to be admitted to the rehab unit at [facility] along with an h&p.

## 2014-06-20 NOTE — Telephone Encounter (Signed)
Spoke with caretaker whom states patient would like to see [place]/PA/NP.  Reports swelling. Advised that his nurse will call them tomorrow to arrange this.

## 2014-06-20 NOTE — Telephone Encounter (Signed)
Bernandette called  from [facility] to ask for a order for admission to rehab unit . She is also asking copy of medication list and h&p    She would like a call back Phone number [phone]  Fax number [phone]

## 2014-06-20 NOTE — Telephone Encounter (Signed)
LM on Sarah Mays VM TCB

## 2014-06-20 NOTE — Telephone Encounter (Signed)
Not possible to write a history and physical without actually taking a history and physical which would require a visit.   If there are specific needs for this admission, this would need to be brought with her. In addition, if this unit is a nursing home, I do not provide nursing home care and that is what this sounds like.

## 2014-06-21 ENCOUNTER — Ambulatory Visit (INDEPENDENT_AMBULATORY_CARE_PROVIDER_SITE_OTHER): Payer: Medicare Other | Admitting: Nurse Practitioner

## 2014-06-21 ENCOUNTER — Encounter: Payer: Self-pay | Admitting: Nurse Practitioner

## 2014-06-21 VITALS — BP 169/80 | HR 88 | Ht 62.0 in | Wt 119.6 lb

## 2014-06-21 DIAGNOSIS — K5900 Constipation, unspecified: Secondary | ICD-10-CM | POA: Diagnosis not present

## 2014-06-21 DIAGNOSIS — I2510 Atherosclerotic heart disease of native coronary artery without angina pectoris: Secondary | ICD-10-CM | POA: Diagnosis not present

## 2014-06-21 DIAGNOSIS — I5033 Acute on chronic diastolic (congestive) heart failure: Secondary | ICD-10-CM

## 2014-06-21 DIAGNOSIS — I480 Paroxysmal atrial fibrillation: Secondary | ICD-10-CM

## 2014-06-21 DIAGNOSIS — I10 Essential (primary) hypertension: Secondary | ICD-10-CM | POA: Diagnosis not present

## 2014-06-21 NOTE — Progress Notes (Signed)
Patient Name: Sarah Mays Date of Encounter: 06/21/2014  Primary Care Provider:  Garret Reddish, MD Primary Cardiologist:  Einar Crow, MD   Chief Complaint  81 year old female with prior history of diastolic heart failure and paroxysmal atrial fibrillation who presents related to increasing abdominal bloating and lower extremity edema.  Past Medical History   Past Medical History  Diagnosis Date  . PAF (paroxysmal atrial fibrillation)     a. on amio (02/2012 mild obstruction on PFT's) and xarelto.  . Essential hypertension   . Mitral valve disorders   . Tricuspid valve disorders, specified as nonrheumatic   . Neck mass     right, w/u including MRI negative  . Hypothyroidism   . Macular degeneration     Left, s/p inj. tx  . Senile osteoporosis     Reclast in the past  . Alopecia, unspecified   . Pure hypercholesterolemia   . Chronic diastolic CHF (congestive heart failure)     a. 11/2013 Echo: EF 60-65%, no rwma, mild TR, PASP 33mmHg.  [name] Kitchen Headache(784.0)   . Backache, unspecified   . Polycythemia vera(238.4)   . Gouty arthropathy, unspecified   . Insomnia, unspecified   . Other malaise and fatigue   . Hyposmolality and/or hyponatremia   . Hyperpotassemia   . Edema   . Closed fracture of lower end of radius with ulna   . Hypercalcemia   . Deafness     Left, s/p multiple surgeries  . Abnormal stress test     a. 11/2011 Ex MV: EF 80%, small, partially reversible anteroapical defect->mild ischemia vs attenuation-->med Rx.   Past Surgical History  Procedure Laterality Date  . Breast biopsy  06/18/1998    left  . Total abdominal hysterectomy    . Tonsillectomy    . Breast biopsy      Allergies  Allergies  Allergen Reactions  . Amlodipine     Lower extremity edema     HPI  81 year old female with the above complex problem list. She has a history of chronic diastolic heart failure as well as paroxysmal atrial fibrillation and some degree of chronic lower  extremity edema. She stays at [facility]. She has been dealing with low back pain for several months now and has previously received steroid injections. She has been on oral analgesics and with that has developed constipation. About a week to 10 days ago, she was placed on a steroid taper as well. Due to ongoing back pain, her mobility has been fairly limited and she was transferred yesterday from assisted living to rehabilitation. Today, she complained to the nurse regarding abdominal bloating, constipation, and bilateral pedal edema. Her weight was apparently up about 10 pounds yesterday at 126, whereas she typically runs about 116. Our office was contacted this morning and she was added onto my schedule. She denies PND, orthopnea, dizziness, syncope, or early satiety. She has not had any chest pain.  Home Medications  Prior to Admission medications   Medication Sig Start Date End Date Taking? Authorizing Provider  amiodarone (PACERONE) 200 MG tablet Take 1 tablet (200 mg total) by mouth daily. 12/10/13   Larey Dresser, MD  baclofen (LIORESAL) 10 MG tablet Take 0.5 tablets (5 mg total) by mouth at bedtime. 04/09/14   Lyndal Pulley, DO  Cholecalciferol (VITAMIN D3) 400 UNITS CAPS Take 1 capsule by mouth daily.    Historical Provider, MD  fluticasone (FLONASE) 50 MCG/ACT nasal spray Place 2 sprays into both nostrils daily. Patient not  taking: Reported on 05/10/2014 04/23/14   Lyndal Pulley, DO  furosemide (LASIX) 40 MG tablet Take 1.5 tablets (60 mg total) by mouth 2 (two) times daily. 11/30/13   Larey Dresser, MD  gabapentin (NEURONTIN) 100 MG capsule Take 1 capsule (100 mg total) by mouth at bedtime. 05/14/14   Lyndal Pulley, DO  GLUCOSAMINE-CHONDROITIN DS PO Take 1 tablet by mouth daily.     Historical Provider, MD  hydrALAZINE (APRESOLINE) 50 MG tablet Take 1.5 tablets (75 mg total) by mouth 3 (three) times daily. 11/30/13   Larey Dresser, MD  hydroxyurea (HYDREA) 500 MG capsule TAKE 2  CAPSULES ON MONDAYS AND TAKE 1 CAPSULE ALL OTHER DAYS OF THE WEEK. Patient taking differently: TAKE 2 CAPSULES BY MOUTH ON MONDAYS AND TAKE 1 CAPSULE BY MOUTH ALL OTHER DAYS OF THE WEEK. 12/17/13   Heath Lark, MD  ibandronate (BONIVA) 150 MG tablet TAKE ONE TABLET ONCE A MONTH IN THE MORNING ON AN EMPTY STOMACH WITH WATER. REMAIN UPRIGHT. 04/12/14   Larey Dresser, MD  levothyroxine (SYNTHROID, LEVOTHROID) 112 MCG tablet Take 112 mcg by mouth daily before breakfast.    Historical Provider, MD  potassium chloride SA (K-DUR,KLOR-CON) 20 MEQ tablet Take 1 tablet (20 mEq total) by mouth daily. 12/14/13   Larey Dresser, MD  predniSONE (DELTASONE) 50 MG tablet Take 1 tablet daily. 06/10/14   Lyndal Pulley, DO  traMADol (ULTRAM) 50 MG tablet Take 1 tablet (50 mg total) by mouth every 6 (six) hours as needed. 06/13/14   Lyndal Pulley, DO  XARELTO 15 MG TABS tablet TAKE 1 TABLET ONCE DAILY. 03/20/14   Larey Dresser, MD    Review of Systems  As above, she has been dealing with constipation and abdominal bloating, which has worsened over the past week. She has also had pedal edema over the past week.  She denies PND, orthopnea, dizziness, syncope, chest pain, or early satiety. All other systems reviewed and are otherwise negative except as noted above.  Physical Exam  VS:  BP 169/80 mmHg  Pulse 88  Ht 5\' 2"  (1.575 m)  Wt 119 lb 9.6 oz (54.25 kg)  BMI 21.87 kg/m2 , BMI Body mass index is 21.87 kg/(m^2). GEN: Well nourished, well developed, in no acute distress. HEENT: normal. Neck: Supple, no JVD, carotid bruits, or masses. Cardiac: Irregular S1, S2, no murmurs, rubs, or gallops. No clubbing, cyanosis, trace bilateral ankle edema with 1+ bilateral pedal edema. Radials/DP/PT 2+ and equal bilaterally.  Respiratory:  Respirations regular and unlabored, bibasilar crackles GI: Semi-firm and protuberant. She is diffusely tender. Bowel sounds present 4. MS: no deformity or atrophy. Skin: warm and dry, no  rash. Neuro:  Strength and sensation are intact. Psych: Normal affect.  Accessory Clinical Findings  ECG - RSR, 1st deg avb, rae, inc rbbb, lad/lafb, poor r progression - no acute st/t changes.  Assessment & Plan  1.  Acute on chronic diastolic congestive heart failure: Patient presents with a several day history of increasing lower extremity edema, increasing abdominal bloating in the setting of constipation, and also weight gain. She was apparently measured at 126 pounds at rehabilitation yesterday. She is 119 pounds here today. She says her typical weight is 116. She does have mild volume overload on exam though no significant JVD. I've asked her to increase her Lasix from 60 mg twice a day to 80 mg twice a day for the next 5 days and then she will drop back down  to 60 mg twice a day. Her blood pressure is elevated and I have recommended in my note to nursing home providers that her blood pressure does not improve despite diuresis, then her hydralazine could be titrated to 100 mg 3 times a day. I've also discussed the importance of daily weights with nursing home staff today.  2. Essential hypertension: Blood pressure is elevated today. As above, and increasing Lasix 80 mg twice a day for the next 5 days. If blood pressure does not improve with diuresis, I have left instructions for nursing home medical team to increase hydralazine to 100 mg 3 times a day.  3. Paroxysmal A. fib relation: Patient is in sinus rhythm with PACs today. She is on chronic amiodarone and also Xarelto.  4. Chronic low back pain: She is currently undergoing rehabilitation for this. This is likely at the crux of her current issues given need for analgesics with resultant constipation and subsequently steroids with resultant fluid retention.  5. Constipation: She has been receiving a stool softener. It is unclear as to what extent her abdominal bloating is secondary to volume excess. Follow with diuresis.  6. Disposition:  Follow-up in 2 weeks.  Murray Hodgkins, NP 06/21/2014, 1:22 PM

## 2014-06-21 NOTE — Telephone Encounter (Signed)
Spoke with Fil at [phone]. She asked me to call [name] after 9AM at [phone].

## 2014-06-21 NOTE — Telephone Encounter (Signed)
[name] states she saw pt yesterday and she is concerned about bilateral edema and increase in BP. [name] states that pt was moved to Rehab at [facility] yesterday because of back pain. Bernadette states pt's BP yesterday was 170/90, she does not if there have been any increases in pt's weight or  SOB.  [name] reports  pt voiced concerns to her about worsening LE edema and increase in BP. [name] is requesting appt for pt today.   I reviewed with Patterson Hammersmith will see pt today at 12N. [name] advised I have scheduled pt to see Gerald Stabs today at [facility].

## 2014-06-21 NOTE — Patient Instructions (Signed)
Medication Instructions:   TAKE 80 MG OF LASIX FOR 5 DAYS THEN RESUME BACK TO 60 MG ONCE  A DAY   Labwork:   Testing/Procedures:   Follow-Up:  NEEDS TO BE ADDED ON FLEX SCHEDULE  FOR A  2 WEEKS FOLLOW UP  FOR DIASTOLIC CHF  OR AN AVAILABLE APP ONLY IF POSSIBLE   Any Other Special Instructions Will Be Listed Below (If Applicable).

## 2014-06-24 ENCOUNTER — Non-Acute Institutional Stay (SKILLED_NURSING_FACILITY): Payer: Medicare Other | Admitting: Adult Health

## 2014-06-24 ENCOUNTER — Encounter: Payer: Self-pay | Admitting: Adult Health

## 2014-06-24 DIAGNOSIS — K5901 Slow transit constipation: Secondary | ICD-10-CM | POA: Diagnosis not present

## 2014-06-24 DIAGNOSIS — M5440 Lumbago with sciatica, unspecified side: Secondary | ICD-10-CM

## 2014-06-24 DIAGNOSIS — M810 Age-related osteoporosis without current pathological fracture: Secondary | ICD-10-CM | POA: Diagnosis not present

## 2014-06-24 DIAGNOSIS — M6281 Muscle weakness (generalized): Secondary | ICD-10-CM | POA: Diagnosis not present

## 2014-06-24 DIAGNOSIS — K5900 Constipation, unspecified: Secondary | ICD-10-CM | POA: Insufficient documentation

## 2014-06-24 DIAGNOSIS — I5032 Chronic diastolic (congestive) heart failure: Secondary | ICD-10-CM

## 2014-06-24 DIAGNOSIS — I482 Chronic atrial fibrillation, unspecified: Secondary | ICD-10-CM

## 2014-06-24 DIAGNOSIS — R262 Difficulty in walking, not elsewhere classified: Secondary | ICD-10-CM | POA: Diagnosis not present

## 2014-06-24 DIAGNOSIS — R600 Localized edema: Secondary | ICD-10-CM | POA: Diagnosis not present

## 2014-06-24 DIAGNOSIS — Z5189 Encounter for other specified aftercare: Secondary | ICD-10-CM | POA: Diagnosis not present

## 2014-06-24 DIAGNOSIS — Z7952 Long term (current) use of systemic steroids: Secondary | ICD-10-CM | POA: Insufficient documentation

## 2014-06-24 DIAGNOSIS — M545 Low back pain: Secondary | ICD-10-CM | POA: Diagnosis not present

## 2014-06-24 DIAGNOSIS — I10 Essential (primary) hypertension: Secondary | ICD-10-CM | POA: Diagnosis not present

## 2014-06-24 DIAGNOSIS — R2681 Unsteadiness on feet: Secondary | ICD-10-CM | POA: Diagnosis not present

## 2014-06-24 NOTE — Progress Notes (Signed)
Patient ID: TARON CONREY, female   DOB: May 13, 1930, 81 y.o.   MRN: [id number]    Nursing Home Location:  [place]   Place of Service: SNF (31)    Allergies  Allergen Reactions  . Amlodipine     Lower extremity edema     Chief Complaint  Patient presents with  . Acute Visit    constipation, back pain    HPI:  Patient is a 81 y.o. female seen today at [facility] in the rehab section. She was admitted on 06/20/14 for pain management due to lumbar back pain from lumbar stenosis, degenerative changes, and disc protrusion. She has had two epidural injections, most recently on 05/03/14.  She is being followed by Dr. Jacelyn Grip with orthopedics and may have an ablative procedure in the near future. We are awaiting their records for review. She is on prednisone for pain control and has developed fluid overload in the setting of a h/o afib and diastolic CHF.  She was seen by cardiology and her lasix dose was increased to 80 mg BID for 5 days. She continues with edema her legs but no dyspnea. Her weight has not been remeasured since her admission, currently at 124.5lbs. She also has had constipation over the weekend, requiring two doses of dulcolax suppository. She is using Ultram prn for pain and this may be contributing to this issue.   Review of Systems:  Review of Systems  Constitutional: Positive for activity change. Negative for fever, chills, appetite change, fatigue and unexpected weight change.  HENT: Negative for congestion and trouble swallowing.   Respiratory: Negative for cough, shortness of breath and wheezing.   Cardiovascular: Positive for leg swelling. Negative for chest pain and palpitations.  Gastrointestinal: Positive for constipation and abdominal distention. Negative for abdominal pain and blood in stool.  Genitourinary: Negative for dysuria and difficulty urinating.  Musculoskeletal: Positive for back pain and gait problem.    Neurological: Negative for dizziness, seizures, facial asymmetry, speech difficulty and headaches.  Psychiatric/Behavioral: Negative for behavioral problems, confusion and agitation.    Past Medical History  Diagnosis Date  . PAF (paroxysmal atrial fibrillation)     a. on amio (02/2012 mild obstruction on PFT's) and xarelto.  . Essential hypertension   . Mitral valve disorders   . Tricuspid valve disorders, specified as nonrheumatic   . Neck mass     right, w/u including MRI negative  . Hypothyroidism   . Macular degeneration     Left, s/p inj. tx  . Senile osteoporosis     Reclast in the past  . Alopecia, unspecified   . Pure hypercholesterolemia   . Chronic diastolic CHF (congestive heart failure)     a. 11/2013 Echo: EF 60-65%, no rwma, mild TR, PASP 41mmHg.  [name] Kitchen Headache(784.0)   . Backache, unspecified   . Polycythemia vera(238.4)   . Gouty arthropathy, unspecified   . Insomnia, unspecified   . Other malaise and fatigue   . Hyposmolality and/or hyponatremia   . Hyperpotassemia   . Edema   . Closed fracture of lower end of radius with ulna   . Hypercalcemia   . Deafness     Left, s/p multiple surgeries  . Abnormal stress test     a. 11/2011 Ex MV: EF 80%, small, partially reversible anteroapical defect->mild ischemia vs attenuation-->med Rx.   Past Surgical History  Procedure Laterality Date  . Breast biopsy  06/18/1998    left  . Total abdominal hysterectomy    .  Tonsillectomy    . Breast biopsy     Social History:   reports that she quit smoking about 30 years ago. Her smoking use included Cigarettes. She has never used smokeless tobacco. She reports that she drinks about 0.6 oz of alcohol per week. She reports that she does not use illicit drugs.  Family History  Problem Relation Age of Onset  . Hypertension Father   . Heart disease Father     patient does not know details.   . Breast cancer Daughter   . Cancer Daughter     breast  . Ovarian cancer Mother  38  . Cancer Sister     colon  . Heart disease Sister   . Cancer Sister   . Cancer Sister     hodgkin disease    Medications: Patient's Medications  New Prescriptions   No medications on file  Previous Medications   AMIODARONE (PACERONE) 200 MG TABLET    Take 1 tablet (200 mg total) by mouth daily.   BACLOFEN (LIORESAL) 10 MG TABLET    Take 0.5 tablets (5 mg total) by mouth at bedtime.   BIOTIN 5000 MCG TABS    Take 5,000 mcg by mouth daily.   CHOLECALCIFEROL (VITAMIN D3) 400 UNITS CAPS    Take 1 capsule by mouth daily.   FLUTICASONE (FLONASE) 50 MCG/ACT NASAL SPRAY    Place 2 sprays into both nostrils daily.   FUROSEMIDE (LASIX) 40 MG TABLET    Take 1.5 tablets (60 mg total) by mouth 2 (two) times daily.   GABAPENTIN (NEURONTIN) 100 MG CAPSULE    Take 1 capsule (100 mg total) by mouth at bedtime.   GLUCOSAMINE-CHONDROITIN DS PO    Take 1 tablet by mouth daily.    HYDRALAZINE (APRESOLINE) 50 MG TABLET    Take 1.5 tablets (75 mg total) by mouth 3 (three) times daily.   HYDROXYUREA (HYDREA) 500 MG CAPSULE    TAKE 2 CAPSULES ON MONDAYS AND TAKE 1 CAPSULE ALL OTHER DAYS OF THE WEEK.   IBANDRONATE (BONIVA) 150 MG TABLET    TAKE ONE TABLET ONCE A MONTH IN THE MORNING ON AN EMPTY STOMACH WITH WATER. REMAIN UPRIGHT.   LEVOTHYROXINE (SYNTHROID, LEVOTHROID) 112 MCG TABLET    Take 112 mcg by mouth daily before breakfast.   MAGNESIUM 250 MG TABS    Take 1 tablet by mouth daily.   MELATONIN 5 MG CAPS    Take 5 mg by mouth at bedtime.   MULTIPLE VITAMINS-MINERALS (PRESERVISION AREDS PO)    Take 1 capsule by mouth daily.   POTASSIUM CHLORIDE SA (K-DUR,KLOR-CON) 20 MEQ TABLET    Take 1 tablet (20 mEq total) by mouth daily.   PREDNISONE (DELTASONE) 50 MG TABLET    Take 1 tablet daily.   TRAMADOL (ULTRAM) 50 MG TABLET    Take 1 tablet (50 mg total) by mouth every 6 (six) hours as needed.   XARELTO 15 MG TABS TABLET    TAKE 1 TABLET ONCE DAILY.  Modified Medications   No medications on file    Discontinued Medications   No medications on file     Physical Exam: Filed Vitals:   06/24/14 1227  BP: 129/67  Pulse: 77  Temp: 98.4 F (36.9 C)  Resp: 17  Weight: 124 lb 12.8 oz (56.609 kg)  SpO2: 92%    Physical Exam  Constitutional: She is oriented to person, place, and time. No distress.  HENT:  Head: Normocephalic and atraumatic.  Neck: No  JVD present.  Right neck lipoma  Cardiovascular:  Irregular rhythm.  BLE edema +2  Pulmonary/Chest: Effort normal. No respiratory distress.  Bibasilar crackles noted  Abdominal: Soft. Bowel sounds are normal. She exhibits distension. There is no tenderness.  Musculoskeletal: She exhibits tenderness (lumbar spine with decreased ROM).  Neurological: She is alert and oriented to person, place, and time. No cranial nerve deficit.  Skin: Skin is warm and dry. She is not diaphoretic.  Psychiatric: She has a normal mood and affect.    Labs reviewed: Basic Metabolic Panel:  Recent Labs  12/14/13 1430 12/26/13 1649 04/15/14 1530  NA 137 138 134*  K 3.4* 4.0 3.9  CL 97 102 95*  CO2 33* 29 35*  GLUCOSE 110* 97 92  BUN 24* 21 18  CREATININE 1.3* 1.2 1.03  CALCIUM 10.7* 10.7* 10.3   Liver Function Tests:  Recent Labs  11/19/13 1445 11/28/13 1626 04/15/14 1530  AST 16 26 13   ALT 13 28 15   ALKPHOS 59 77 103  BILITOT 0.9 0.8 0.4  PROT 7.2 6.9 6.9  ALBUMIN 4.1 4.0 4.1   No results for input(s): LIPASE, AMYLASE in the last 8760 hours. No results for input(s): AMMONIA in the last 8760 hours. CBC:  Recent Labs  10/11/13 1308  12/05/13 1157 04/15/14 1530 05/09/14 1302  WBC 7.8  < > 6.8 9.7 8.1  NEUTROABS 5.7  --   --  7.8* 6.1  HGB 12.2  < > 13.8 12.4 12.5  HCT 37.5  < > 41.5 36.9 38.4  MCV 115.8*  < > 112.9 Repeated and verified X2.* 109.8* 111.9*  PLT 260  < > 317.0 508.0* 349  < > = values in this interval not displayed. TSH:  Recent Labs  07/20/13 1242 11/19/13 1445 05/15/14 1540  TSH 0.35 0.87 1.10    A1C: No results found for: HGBA1C Lipid Panel: No results for input(s): CHOL, HDL, LDLCALC, TRIG, CHOLHDL, LDLDIRECT in the last 8760 hours.  Radiological Exams: Reviewed MRI 06/24/2014  from 04/15/14 1. Advanced degenerative lumbar spondylosis with multilevel disc disease and facet disease. 2. Left foraminal disc protrusion at L2-3 with mild left foraminal stenosis. 3. Shallow broad-based foraminal and extra foraminal disc protrusion on the left at L3-4 likely effecting both the left L3 and L4 nerve roots. 4. Mild bilateral lateral recess stenosis at L4-5. There is also a right foraminal and extra foraminal disc protrusion possibly affecting the left L4 nerve root.  Assessment/Plan   1. Midline low back pain with sciatica, sciatica laterality unspecified -Reports pain but does not want be "drugged" -Increase Tylenol to 650 mg TID and continue prn -Continue prn ultram -Continue Prednisone, will check BMP to monitor fasting glucose. She apparently tried discontinuing the prednisone recently but could not tolerate the pain. It would be best if she could be tapered to at least a lower dose in the future due to her fluid retention.  -PT/OT to eval tx -I have asked the staff to obtain the ortho notes to enlighten [place] in regards to her plan of care  2. Slow transit constipation Increase Senna s to BID, hold for loose stools.  3. Chronic diastolic CHF (congestive heart failure) -Unchanged according to assessment noted by cardiology last week. Continue Lasix and Kdur -Check BMP -Elevated BLE -TED hose -monitor weight three times weekly  4. Chronic atrial fibrillation Rate controlled, continue Xarelto  5. Osteoporosis Continue Boniva and Vitamin D  6. HTN Improved today. Continue current regimen   Labs/tests ordered  CBC, BMP in the am  Cindi Carbon, [place] [phone]

## 2014-06-25 ENCOUNTER — Non-Acute Institutional Stay (SKILLED_NURSING_FACILITY): Payer: Medicare Other | Admitting: Internal Medicine

## 2014-06-25 ENCOUNTER — Encounter: Payer: Self-pay | Admitting: Internal Medicine

## 2014-06-25 DIAGNOSIS — M6281 Muscle weakness (generalized): Secondary | ICD-10-CM | POA: Diagnosis not present

## 2014-06-25 DIAGNOSIS — E039 Hypothyroidism, unspecified: Secondary | ICD-10-CM | POA: Diagnosis not present

## 2014-06-25 DIAGNOSIS — M545 Low back pain: Secondary | ICD-10-CM | POA: Diagnosis not present

## 2014-06-25 DIAGNOSIS — D45 Polycythemia vera: Secondary | ICD-10-CM

## 2014-06-25 DIAGNOSIS — M810 Age-related osteoporosis without current pathological fracture: Secondary | ICD-10-CM | POA: Diagnosis not present

## 2014-06-25 DIAGNOSIS — I10 Essential (primary) hypertension: Secondary | ICD-10-CM

## 2014-06-25 DIAGNOSIS — G4700 Insomnia, unspecified: Secondary | ICD-10-CM

## 2014-06-25 DIAGNOSIS — R2681 Unsteadiness on feet: Secondary | ICD-10-CM | POA: Diagnosis not present

## 2014-06-25 DIAGNOSIS — I480 Paroxysmal atrial fibrillation: Secondary | ICD-10-CM | POA: Diagnosis not present

## 2014-06-25 DIAGNOSIS — Z5189 Encounter for other specified aftercare: Secondary | ICD-10-CM | POA: Diagnosis not present

## 2014-06-25 DIAGNOSIS — I5032 Chronic diastolic (congestive) heart failure: Secondary | ICD-10-CM | POA: Diagnosis not present

## 2014-06-25 DIAGNOSIS — M5440 Lumbago with sciatica, unspecified side: Secondary | ICD-10-CM | POA: Diagnosis not present

## 2014-06-25 DIAGNOSIS — K5901 Slow transit constipation: Secondary | ICD-10-CM | POA: Diagnosis not present

## 2014-06-25 DIAGNOSIS — R262 Difficulty in walking, not elsewhere classified: Secondary | ICD-10-CM | POA: Diagnosis not present

## 2014-06-25 DIAGNOSIS — R600 Localized edema: Secondary | ICD-10-CM | POA: Diagnosis not present

## 2014-06-25 DIAGNOSIS — Z7952 Long term (current) use of systemic steroids: Secondary | ICD-10-CM | POA: Diagnosis not present

## 2014-06-25 NOTE — Progress Notes (Signed)
Patient ID: Sarah Mays, female   DOB: 1930/02/28, 81 y.o.   MRN: [id number]  Provider:  Rexene Edison. Mariea Clonts, D.O., C.M.D. Location:  [facility]   PCP: Garret Reddish, MD  Code Status: full code Goals of Care: Advanced Directive information Does patient have an advance directive?: Yes, Type of Advance Directive: [place];Living will, Does patient want to make changes to advanced directive?: No - Patient declined   Chief Complaint  Patient presents with  . New Admit To SNF    admitted to rehab due to uncontrolled back pain and inability to take care of herself in independent living     HPI: 81 y.o. female with h/o paroxysmal afib on xarelto, chronic diastolic chf, CAD, constipation, hypothyroidism, htn, senile osteoporosis (previously on reclast and now on boniva), polycythemia vera followed by Dr. Alvy Bimler, macular degeneration, low back pain with L3-4 nerve impingement, insomnia, hypercalcemia, headaches, and gout was seen upon admission to rehab due to inability to perform self care due to severe low back pain.  She has apparently seen Dr. Mina Marble from orthopedics about her back and has had 2 prior epidurals (last 05/03/14).  They are considering an ablation procedure.  We have requested records.  Currently she is on oral prednisone for pain.    She recently saw her cardiologist due to increased volume overload and lasix was increased to 80mg  po bid for 5 days (done 5/13).  Her weight was 124.5 lb yesterday down from 124.8 at admission.  She was having difficulty with constipation when NP Wert saw her yesterday which was not helped by her tramadol.  She got two dulcolax suppositories over the weekend.  Her tylenol was scheduled and tramadol continued as needed along with her prednisone.  Therapy is working with her.  Senna s was increased to twice a day with holds.  She is on three times weekly weights for her chf and using teds, elevating feet at rest.  Cbc and bmp were  ordered and pending.    When seen, she c/o breakthrough pain with tramadol use.    ROS: Review of Systems  Constitutional: Negative for fever, chills and malaise/fatigue.  HENT: Negative for congestion.   Eyes: Negative for blurred vision.  Respiratory: Negative for shortness of breath.   Cardiovascular: Negative for chest pain and leg swelling.  Gastrointestinal: Positive for constipation. Negative for abdominal pain, blood in stool and melena.       Bowels are now moving  Genitourinary: Negative for dysuria.  Musculoskeletal: Positive for back pain. Negative for falls.  Skin: Negative for rash.  Neurological: Positive for weakness. Negative for dizziness and loss of consciousness.  Psychiatric/Behavioral: Negative for depression and memory loss.    Past Medical History  Diagnosis Date  . PAF (paroxysmal atrial fibrillation)     a. on amio (02/2012 mild obstruction on PFT's) and xarelto.  . Essential hypertension   . Mitral valve disorders   . Tricuspid valve disorders, specified as nonrheumatic   . Neck mass     right, w/u including MRI negative  . Hypothyroidism   . Macular degeneration     Left, s/p inj. tx  . Senile osteoporosis     Reclast in the past  . Alopecia, unspecified   . Pure hypercholesterolemia   . Chronic diastolic CHF (congestive heart failure)     a. 11/2013 Echo: EF 60-65%, no rwma, mild TR, PASP 63mmHg.  [name] Kitchen Headache(784.0)   . Backache, unspecified   . Polycythemia vera(238.4)   .  Gouty arthropathy, unspecified   . Insomnia, unspecified   . Other malaise and fatigue   . Hyposmolality and/or hyponatremia   . Hyperpotassemia   . Edema   . Closed fracture of lower end of radius with ulna   . Hypercalcemia   . Deafness     Left, s/p multiple surgeries  . Abnormal stress test     a. 11/2011 Ex MV: EF 80%, small, partially reversible anteroapical defect->mild ischemia vs attenuation-->med Rx.   Past Surgical History  Procedure Laterality Date  .  Breast biopsy  06/18/1998    left  . Total abdominal hysterectomy    . Tonsillectomy    . Breast biopsy     Social History:   reports that she quit smoking about 30 years ago. Her smoking use included Cigarettes. She has never used smokeless tobacco. She reports that she drinks about 0.6 oz of alcohol per week. She reports that she does not use illicit drugs.  Family History  Problem Relation Age of Onset  . Hypertension Father   . Heart disease Father     patient does not know details.   . Breast cancer Daughter   . Cancer Daughter     breast  . Ovarian cancer Mother 38  . Cancer Sister     colon  . Heart disease Sister   . Cancer Sister   . Cancer Sister     hodgkin disease    Allergies  Allergen Reactions  . Amlodipine     Lower extremity edema     Medications: Patient's Medications  New Prescriptions   No medications on file  Previous Medications   AMIODARONE (PACERONE) 200 MG TABLET    Take 1 tablet (200 mg total) by mouth daily.   BACLOFEN (LIORESAL) 10 MG TABLET    Take 0.5 tablets (5 mg total) by mouth at bedtime.   BIOTIN 5000 MCG TABS    Take 5,000 mcg by mouth daily.   CHOLECALCIFEROL (VITAMIN D3) 400 UNITS CAPS    Take 1 capsule by mouth daily.   FLUTICASONE (FLONASE) 50 MCG/ACT NASAL SPRAY    Place 2 sprays into both nostrils daily.   FUROSEMIDE (LASIX) 40 MG TABLET    Take 1.5 tablets (60 mg total) by mouth 2 (two) times daily.   GABAPENTIN (NEURONTIN) 100 MG CAPSULE    Take 1 capsule (100 mg total) by mouth at bedtime.   GLUCOSAMINE-CHONDROITIN DS PO    Take 1 tablet by mouth daily.    HYDRALAZINE (APRESOLINE) 50 MG TABLET    Take 1.5 tablets (75 mg total) by mouth 3 (three) times daily.   HYDROXYUREA (HYDREA) 500 MG CAPSULE    TAKE 2 CAPSULES ON MONDAYS AND TAKE 1 CAPSULE ALL OTHER DAYS OF THE WEEK.   IBANDRONATE (BONIVA) 150 MG TABLET    TAKE ONE TABLET ONCE A MONTH IN THE MORNING ON AN EMPTY STOMACH WITH WATER. REMAIN UPRIGHT.   LEVOTHYROXINE  (SYNTHROID, LEVOTHROID) 112 MCG TABLET    Take 112 mcg by mouth daily before breakfast.   MAGNESIUM 250 MG TABS    Take 1 tablet by mouth daily.   MELATONIN 5 MG CAPS    Take 5 mg by mouth at bedtime.   MULTIPLE VITAMINS-MINERALS (PRESERVISION AREDS PO)    Take 1 capsule by mouth daily.   POTASSIUM CHLORIDE SA (K-DUR,KLOR-CON) 20 MEQ TABLET    Take 1 tablet (20 mEq total) by mouth daily.   PREDNISONE (DELTASONE) 50 MG TABLET  Take 1 tablet daily.   TRAMADOL (ULTRAM) 50 MG TABLET    Take 1 tablet (50 mg total) by mouth every 6 (six) hours as needed.   XARELTO 15 MG TABS TABLET    TAKE 1 TABLET ONCE DAILY.  Modified Medications   No medications on file  Discontinued Medications   No medications on file     Physical Exam: Filed Vitals:   06/25/14 0948  BP: 129/67  Pulse: 73  Temp: 98.4 F (36.9 C)  Resp: 19  Height: 5\' 2"  (1.575 m)  Weight: 124 lb 12.8 oz (56.609 kg)  SpO2: 92%   Body mass index is 22.82 kg/(m^2). Physical Exam  Constitutional: She is oriented to person, place, and time.  Cardiovascular: Normal rate, regular rhythm, normal heart sounds and intact distal pulses.   At present  Pulmonary/Chest: Effort normal and breath sounds normal. She has no rales.  Abdominal: Soft. Bowel sounds are normal. She exhibits no distension and no mass. There is no tenderness.  Musculoskeletal: Normal range of motion.  No reproducible tenderness across her lumbar region  Neurological: She is alert and oriented to person, place, and time.  Skin: Skin is warm and dry.  Psychiatric: She has a normal mood and affect.    Labs reviewed: Basic Metabolic Panel:  Recent Labs  12/14/13 1430 12/26/13 1649 04/15/14 1530  NA 137 138 134*  K 3.4* 4.0 3.9  CL 97 102 95*  CO2 33* 29 35*  GLUCOSE 110* 97 92  BUN 24* 21 18  CREATININE 1.3* 1.2 1.03  CALCIUM 10.7* 10.7* 10.3   Liver Function Tests:  Recent Labs  11/19/13 1445 11/28/13 1626 04/15/14 1530  AST 16 26 13   ALT 13 28  15   ALKPHOS 59 77 103  BILITOT 0.9 0.8 0.4  PROT 7.2 6.9 6.9  ALBUMIN 4.1 4.0 4.1   No results for input(s): LIPASE, AMYLASE in the last 8760 hours. No results for input(s): AMMONIA in the last 8760 hours. CBC:  Recent Labs  10/11/13 1308  12/05/13 1157 04/15/14 1530 05/09/14 1302  WBC 7.8  < > 6.8 9.7 8.1  NEUTROABS 5.7  --   --  7.8* 6.1  HGB 12.2  < > 13.8 12.4 12.5  HCT 37.5  < > 41.5 36.9 38.4  MCV 115.8*  < > 112.9 Repeated and verified X2.* 109.8* 111.9*  PLT 260  < > 317.0 508.0* 349  < > = values in this interval not displayed. Cardiac Enzymes:  Recent Labs  04/15/14 1530  CKTOTAL 24   BNP: Invalid input(s): POCBNP  No results found for: HGBA1C Lab Results  Component Value Date   TSH 1.10 05/15/2014   No results found for: VITAMINB12 No results found for: FOLATE No results found for: IRON, TIBC, FERRITIN  Imaging and Procedures obtained prior to SNF admission: MRI (Dr. Tamala Julian) lumbar spine w/o contrast 04/15/14:  1. Advanced degenerative lumbar spondylosis with multilevel disc disease and facet disease. 2. Left foraminal disc protrusion at L2-3 with mild left foraminal stenosis. 3. Shallow broad-based foraminal and extra foraminal disc protrusion on the left at L3-4 likely effecting both the left L3 and L4 nerve roots. 4. Mild bilateral lateral recess stenosis at L4-5. There is also a right foraminal and extra foraminal disc protrusion possibly affecting the left L4 nerve root. Had epidurography on 3/10, 3/25, then epidural/nerve root block 4/18 and left L3-4 facet injection with steroid and anesthetic.  29/30 on MMSE here in rehab  Patient Care Team:  Marin Olp, MD as PCP - General (Family Medicine) [facility] Hurman Horn, MD as Consulting Physician (Ophthalmology) Larey Dresser, MD as Consulting Physician (Cardiology) Heath Lark, MD as Consulting Physician (Hematology and Oncology)  Assessment/Plan 1. Midline low  back pain with sciatica, sciatica laterality unspecified -is still having breakthrough pain with tramadol and prednisone -keep f/u with Dr. Mina Marble -will use butrans patch 56mcg weekly and see how she does with it--can still use a tramadol or two per day if needed -will see how she does with therapy with better pain control  2. Chronic diastolic CHF (congestive heart failure) -cont 80mg  po bid of lasix along with potassium supplement through tomorrow and monitor weights when returns to her 60mg  po bid -had been on 60mg  po bid as her regular dosing -follows with Dr. Aundra Dubin  3. Paroxysmal atrial fibrillation -seemed she was in sinus today -is on amiodarone and xarelto   4. Osteoporosis -with steroid use and postmenopausal state -previously on reclast and now on boniva, also on vitamin D very low dose -cont fall prevention  5. Essential hypertension -bp is at goal with hydralazine  6. Slow transit constipation -worse in context of tramadol and decreased mobility due to pain -cont bowel regimen which has been effective as in hpi  7. Hypothyroidism, unspecified hypothyroidism type -cont synthroid 164mcg daily--had normal TSH last month  8. Insomnia -she is doing well with melatonin 5mg   9.  Polycythemia vera -continues on hydroxyurea for this -monitor cbc  Functional status:  Currently requiring some help with her bathing, dressing, transfers due to pain but should improve with PT, OT back to independent status  Family/ staff Communication: discussed with rehab nurse and nurse manager  Labs/tests ordered:  Cbc, bmp pending Keoni Havey L. Marceil Welp, D.O. [facility] [address], [place] [address] Cell Phone (Mon-Fri 8am-5pm):  [phone] On Call:  [phone] & follow prompts after 5pm & weekends Office Phone:  [phone] Office Fax:  [phone]

## 2014-06-26 DIAGNOSIS — Z5189 Encounter for other specified aftercare: Secondary | ICD-10-CM | POA: Diagnosis not present

## 2014-06-26 DIAGNOSIS — R2681 Unsteadiness on feet: Secondary | ICD-10-CM | POA: Diagnosis not present

## 2014-06-26 DIAGNOSIS — H3532 Exudative age-related macular degeneration: Secondary | ICD-10-CM | POA: Diagnosis not present

## 2014-06-26 DIAGNOSIS — M6281 Muscle weakness (generalized): Secondary | ICD-10-CM | POA: Diagnosis not present

## 2014-06-26 DIAGNOSIS — R600 Localized edema: Secondary | ICD-10-CM | POA: Diagnosis not present

## 2014-06-26 DIAGNOSIS — R262 Difficulty in walking, not elsewhere classified: Secondary | ICD-10-CM | POA: Diagnosis not present

## 2014-06-26 DIAGNOSIS — M545 Low back pain: Secondary | ICD-10-CM | POA: Diagnosis not present

## 2014-06-26 DIAGNOSIS — H3531 Nonexudative age-related macular degeneration: Secondary | ICD-10-CM | POA: Diagnosis not present

## 2014-06-27 ENCOUNTER — Non-Acute Institutional Stay (SKILLED_NURSING_FACILITY): Payer: Medicare Other | Admitting: Adult Health

## 2014-06-27 ENCOUNTER — Encounter: Payer: Self-pay | Admitting: Adult Health

## 2014-06-27 DIAGNOSIS — R600 Localized edema: Secondary | ICD-10-CM | POA: Diagnosis not present

## 2014-06-27 DIAGNOSIS — R262 Difficulty in walking, not elsewhere classified: Secondary | ICD-10-CM | POA: Diagnosis not present

## 2014-06-27 DIAGNOSIS — M545 Low back pain, unspecified: Secondary | ICD-10-CM

## 2014-06-27 DIAGNOSIS — I10 Essential (primary) hypertension: Secondary | ICD-10-CM | POA: Diagnosis not present

## 2014-06-27 DIAGNOSIS — R2681 Unsteadiness on feet: Secondary | ICD-10-CM | POA: Diagnosis not present

## 2014-06-27 DIAGNOSIS — M6281 Muscle weakness (generalized): Secondary | ICD-10-CM | POA: Diagnosis not present

## 2014-06-27 DIAGNOSIS — Z5189 Encounter for other specified aftercare: Secondary | ICD-10-CM | POA: Diagnosis not present

## 2014-06-27 NOTE — Progress Notes (Signed)
Patient ID: Sarah Mays, female   DOB: 26-Jun-1930, 81 y.o.   MRN: [id number]    Nursing Home Location:  [place]   Place of Service: SNF (31)    Allergies  Allergen Reactions  . Amlodipine     Lower extremity edema     Chief Complaint  Patient presents with  . Acute Visit    ?prednisone taper    HPI:  Patient is a 81 y.o. female seen today at [facility] in the rehab section. She was admitted on 06/20/14 for pain management due to lumbar back pain from lumbar stenosis, degenerative changes, and disc protrusion. She has had two epidural injections, most recently on 05/03/14.  She is being followed by Dr. Mina Marble with orthopedics and may have an ablative procedure in the near future. She has been on prednisone for pain control. She reports that her primary doctor was wanting her to taper off of this med due to side effects and she is anxious to try a dose reduction. She was started on a Butrans patch yesterday evening. She is walking with therapy and moving more by herself in her room. She has had issues with fluid overload while on prednisone but this has improved with TED hose and diuretics. She was on Lasix 80 mg BID for 5 days to reduce her BP and fluid. Her BP has improved significantly since admission.  Review of Systems:  Review of Systems  Constitutional: Positive for activity change. Negative for fever, chills, appetite change, fatigue and unexpected weight change.  HENT: Negative for congestion and trouble swallowing.   Respiratory: Negative for cough, shortness of breath and wheezing.   Cardiovascular: Positive for leg swelling. Negative for chest pain and palpitations.  Genitourinary: Negative for dysuria and difficulty urinating.  Musculoskeletal: Positive for back pain and gait problem.  Neurological: Negative for dizziness, seizures, facial asymmetry, speech difficulty and headaches.  Psychiatric/Behavioral: Negative for  behavioral problems, confusion and agitation.    Past Medical History  Diagnosis Date  . PAF (paroxysmal atrial fibrillation)     a. on amio (02/2012 mild obstruction on PFT's) and xarelto.  . Essential hypertension   . Mitral valve disorders   . Tricuspid valve disorders, specified as nonrheumatic   . Neck mass     right, w/u including MRI negative  . Hypothyroidism   . Macular degeneration     Left, s/p inj. tx  . Senile osteoporosis     Reclast in the past  . Alopecia, unspecified   . Pure hypercholesterolemia   . Chronic diastolic CHF (congestive heart failure)     a. 11/2013 Echo: EF 60-65%, no rwma, mild TR, PASP 61mmHg.  [name] Kitchen Headache(784.0)   . Backache, unspecified   . Polycythemia vera(238.4)   . Gouty arthropathy, unspecified   . Insomnia, unspecified   . Other malaise and fatigue   . Hyposmolality and/or hyponatremia   . Hyperpotassemia   . Edema   . Closed fracture of lower end of radius with ulna   . Hypercalcemia   . Deafness     Left, s/p multiple surgeries  . Abnormal stress test     a. 11/2011 Ex MV: EF 80%, small, partially reversible anteroapical defect->mild ischemia vs attenuation-->med Rx.   Past Surgical History  Procedure Laterality Date  . Breast biopsy  06/18/1998    left  . Total abdominal hysterectomy    . Tonsillectomy    . Breast biopsy     Social History:  reports that she quit smoking about 30 years ago. Her smoking use included Cigarettes. She has never used smokeless tobacco. She reports that she drinks about 0.6 oz of alcohol per week. She reports that she does not use illicit drugs.  Family History  Problem Relation Age of Onset  . Hypertension Father   . Heart disease Father     patient does not know details.   . Breast cancer Daughter   . Cancer Daughter     breast  . Ovarian cancer Mother 9  . Cancer Sister     colon  . Heart disease Sister   . Cancer Sister   . Cancer Sister     hodgkin disease     Medications: Patient's Medications  New Prescriptions   No medications on file  Previous Medications   AMIODARONE (PACERONE) 200 MG TABLET    Take 1 tablet (200 mg total) by mouth daily.   BACLOFEN (LIORESAL) 10 MG TABLET    Take 0.5 tablets (5 mg total) by mouth at bedtime.   BIOTIN 5000 MCG TABS    Take 5,000 mcg by mouth daily.   CHOLECALCIFEROL (VITAMIN D3) 400 UNITS CAPS    Take 1 capsule by mouth daily.   FLUTICASONE (FLONASE) 50 MCG/ACT NASAL SPRAY    Place 2 sprays into both nostrils daily.   FUROSEMIDE (LASIX) 40 MG TABLET    Take 1.5 tablets (60 mg total) by mouth 2 (two) times daily.   GABAPENTIN (NEURONTIN) 100 MG CAPSULE    Take 1 capsule (100 mg total) by mouth at bedtime.   GLUCOSAMINE-CHONDROITIN DS PO    Take 1 tablet by mouth daily.    HYDRALAZINE (APRESOLINE) 50 MG TABLET    Take 1.5 tablets (75 mg total) by mouth 3 (three) times daily.   HYDROXYUREA (HYDREA) 500 MG CAPSULE    TAKE 2 CAPSULES ON MONDAYS AND TAKE 1 CAPSULE ALL OTHER DAYS OF THE WEEK.   IBANDRONATE (BONIVA) 150 MG TABLET    TAKE ONE TABLET ONCE A MONTH IN THE MORNING ON AN EMPTY STOMACH WITH WATER. REMAIN UPRIGHT.   LEVOTHYROXINE (SYNTHROID, LEVOTHROID) 112 MCG TABLET    Take 112 mcg by mouth daily before breakfast.   MAGNESIUM 250 MG TABS    Take 1 tablet by mouth daily.   MELATONIN 5 MG CAPS    Take 5 mg by mouth at bedtime.   MULTIPLE VITAMINS-MINERALS (PRESERVISION AREDS PO)    Take 1 capsule by mouth daily.   POTASSIUM CHLORIDE SA (K-DUR,KLOR-CON) 20 MEQ TABLET    Take 1 tablet (20 mEq total) by mouth daily.   PREDNISONE (DELTASONE) 50 MG TABLET    Take 1 tablet daily.   TRAMADOL (ULTRAM) 50 MG TABLET    Take 1 tablet (50 mg total) by mouth every 6 (six) hours as needed.   XARELTO 15 MG TABS TABLET    TAKE 1 TABLET ONCE DAILY.  Modified Medications   No medications on file  Discontinued Medications   No medications on file     Physical Exam: Filed Vitals:   06/27/14 1124  BP: 130/67   Pulse: 65  Temp: 98.5 F (36.9 C)  Resp: 18    Physical Exam  Constitutional: She is oriented to person, place, and time. No distress.  HENT:  Head: Normocephalic and atraumatic.  Neck: No JVD present.  Right neck lipoma  Cardiovascular:  Irregular rhythm.  BLE edema +2  Pulmonary/Chest: Effort normal. No respiratory distress.  Bibasilar crackles noted  Abdominal: Soft. Bowel sounds are normal. She exhibits distension. There is no tenderness.  Musculoskeletal: She exhibits tenderness (lumbar spine with decreased ROM).  Neurological: She is alert and oriented to person, place, and time. No cranial nerve deficit.  Skin: Skin is warm and dry. She is not diaphoretic.  Psychiatric: She has a normal mood and affect.    Labs reviewed: Basic Metabolic Panel:  Recent Labs  12/14/13 1430 12/26/13 1649 04/15/14 1530  NA 137 138 134*  K 3.4* 4.0 3.9  CL 97 102 95*  CO2 33* 29 35*  GLUCOSE 110* 97 92  BUN 24* 21 18  CREATININE 1.3* 1.2 1.03  CALCIUM 10.7* 10.7* 10.3   Liver Function Tests:  Recent Labs  11/19/13 1445 11/28/13 1626 04/15/14 1530  AST 16 26 13   ALT 13 28 15   ALKPHOS 59 77 103  BILITOT 0.9 0.8 0.4  PROT 7.2 6.9 6.9  ALBUMIN 4.1 4.0 4.1   No results for input(s): LIPASE, AMYLASE in the last 8760 hours. No results for input(s): AMMONIA in the last 8760 hours. CBC:  Recent Labs  10/11/13 1308  12/05/13 1157 04/15/14 1530 05/09/14 1302  WBC 7.8  < > 6.8 9.7 8.1  NEUTROABS 5.7  --   --  7.8* 6.1  HGB 12.2  < > 13.8 12.4 12.5  HCT 37.5  < > 41.5 36.9 38.4  MCV 115.8*  < > 112.9 Repeated and verified X2.* 109.8* 111.9*  PLT 260  < > 317.0 508.0* 349  < > = values in this interval not displayed. TSH:  Recent Labs  07/20/13 1242 11/19/13 1445 05/15/14 1540  TSH 0.35 0.87 1.10   A1C: No results found for: HGBA1C Lipid Panel: No results for input(s): CHOL, HDL, LDLCALC, TRIG, CHOLHDL, LDLDIRECT in the last 8760 hours.  Radiological  Exams: Reviewed MRI 06/27/2014  from 04/15/14 1. Advanced degenerative lumbar spondylosis with multilevel disc disease and facet disease. 2. Left foraminal disc protrusion at L2-3 with mild left foraminal stenosis. 3. Shallow broad-based foraminal and extra foraminal disc protrusion on the left at L3-4 likely effecting both the left L3 and L4 nerve roots. 4. Mild bilateral lateral recess stenosis at L4-5. There is also a right foraminal and extra foraminal disc protrusion possibly affecting the left L4 nerve root.  Assessment/Plan   1. Midline low back pain with sciatica, sciatica laterality unspecified -Persistent but able to ambulate and moving more throughout her room per pt.  -Just started butrans patch -Decrease prednisone to 40mg  daily for 1 week, then 30 mg daily. After that she may be discharged and will need to f/u with Dr. Mariea Clonts -Continue PT and OT  2. HTN Her BP has improved over the past few days with the increased lasix dose. Continue current regimen and monitor     Cindi Carbon, [place] [phone]

## 2014-06-28 DIAGNOSIS — R600 Localized edema: Secondary | ICD-10-CM | POA: Diagnosis not present

## 2014-06-28 DIAGNOSIS — M6281 Muscle weakness (generalized): Secondary | ICD-10-CM | POA: Diagnosis not present

## 2014-06-28 DIAGNOSIS — R262 Difficulty in walking, not elsewhere classified: Secondary | ICD-10-CM | POA: Diagnosis not present

## 2014-06-28 DIAGNOSIS — R2681 Unsteadiness on feet: Secondary | ICD-10-CM | POA: Diagnosis not present

## 2014-06-28 DIAGNOSIS — M545 Low back pain: Secondary | ICD-10-CM | POA: Diagnosis not present

## 2014-06-28 DIAGNOSIS — Z5189 Encounter for other specified aftercare: Secondary | ICD-10-CM | POA: Diagnosis not present

## 2014-06-30 DIAGNOSIS — R262 Difficulty in walking, not elsewhere classified: Secondary | ICD-10-CM | POA: Diagnosis not present

## 2014-06-30 DIAGNOSIS — R2681 Unsteadiness on feet: Secondary | ICD-10-CM | POA: Diagnosis not present

## 2014-06-30 DIAGNOSIS — R600 Localized edema: Secondary | ICD-10-CM | POA: Diagnosis not present

## 2014-06-30 DIAGNOSIS — Z5189 Encounter for other specified aftercare: Secondary | ICD-10-CM | POA: Diagnosis not present

## 2014-06-30 DIAGNOSIS — M545 Low back pain: Secondary | ICD-10-CM | POA: Diagnosis not present

## 2014-06-30 DIAGNOSIS — M6281 Muscle weakness (generalized): Secondary | ICD-10-CM | POA: Diagnosis not present

## 2014-07-01 DIAGNOSIS — R2681 Unsteadiness on feet: Secondary | ICD-10-CM | POA: Diagnosis not present

## 2014-07-01 DIAGNOSIS — M6281 Muscle weakness (generalized): Secondary | ICD-10-CM | POA: Diagnosis not present

## 2014-07-01 DIAGNOSIS — R262 Difficulty in walking, not elsewhere classified: Secondary | ICD-10-CM | POA: Diagnosis not present

## 2014-07-01 DIAGNOSIS — Z5189 Encounter for other specified aftercare: Secondary | ICD-10-CM | POA: Diagnosis not present

## 2014-07-01 DIAGNOSIS — R600 Localized edema: Secondary | ICD-10-CM | POA: Diagnosis not present

## 2014-07-01 DIAGNOSIS — M545 Low back pain: Secondary | ICD-10-CM | POA: Diagnosis not present

## 2014-07-02 DIAGNOSIS — M6281 Muscle weakness (generalized): Secondary | ICD-10-CM | POA: Diagnosis not present

## 2014-07-02 DIAGNOSIS — R2681 Unsteadiness on feet: Secondary | ICD-10-CM | POA: Diagnosis not present

## 2014-07-02 DIAGNOSIS — M545 Low back pain: Secondary | ICD-10-CM | POA: Diagnosis not present

## 2014-07-02 DIAGNOSIS — Z5189 Encounter for other specified aftercare: Secondary | ICD-10-CM | POA: Diagnosis not present

## 2014-07-02 DIAGNOSIS — R600 Localized edema: Secondary | ICD-10-CM | POA: Diagnosis not present

## 2014-07-02 DIAGNOSIS — R262 Difficulty in walking, not elsewhere classified: Secondary | ICD-10-CM | POA: Diagnosis not present

## 2014-07-03 DIAGNOSIS — R262 Difficulty in walking, not elsewhere classified: Secondary | ICD-10-CM | POA: Diagnosis not present

## 2014-07-03 DIAGNOSIS — R600 Localized edema: Secondary | ICD-10-CM | POA: Diagnosis not present

## 2014-07-03 DIAGNOSIS — R2681 Unsteadiness on feet: Secondary | ICD-10-CM | POA: Diagnosis not present

## 2014-07-03 DIAGNOSIS — M6281 Muscle weakness (generalized): Secondary | ICD-10-CM | POA: Diagnosis not present

## 2014-07-03 DIAGNOSIS — Z5189 Encounter for other specified aftercare: Secondary | ICD-10-CM | POA: Diagnosis not present

## 2014-07-03 DIAGNOSIS — M545 Low back pain: Secondary | ICD-10-CM | POA: Diagnosis not present

## 2014-07-04 DIAGNOSIS — R2681 Unsteadiness on feet: Secondary | ICD-10-CM | POA: Diagnosis not present

## 2014-07-04 DIAGNOSIS — M545 Low back pain: Secondary | ICD-10-CM | POA: Diagnosis not present

## 2014-07-04 DIAGNOSIS — R600 Localized edema: Secondary | ICD-10-CM | POA: Diagnosis not present

## 2014-07-04 DIAGNOSIS — M6281 Muscle weakness (generalized): Secondary | ICD-10-CM | POA: Diagnosis not present

## 2014-07-04 DIAGNOSIS — Z5189 Encounter for other specified aftercare: Secondary | ICD-10-CM | POA: Diagnosis not present

## 2014-07-04 DIAGNOSIS — R262 Difficulty in walking, not elsewhere classified: Secondary | ICD-10-CM | POA: Diagnosis not present

## 2014-07-05 DIAGNOSIS — R600 Localized edema: Secondary | ICD-10-CM | POA: Diagnosis not present

## 2014-07-05 DIAGNOSIS — M545 Low back pain: Secondary | ICD-10-CM | POA: Diagnosis not present

## 2014-07-05 DIAGNOSIS — R2681 Unsteadiness on feet: Secondary | ICD-10-CM | POA: Diagnosis not present

## 2014-07-05 DIAGNOSIS — M6281 Muscle weakness (generalized): Secondary | ICD-10-CM | POA: Diagnosis not present

## 2014-07-05 DIAGNOSIS — R262 Difficulty in walking, not elsewhere classified: Secondary | ICD-10-CM | POA: Diagnosis not present

## 2014-07-05 DIAGNOSIS — Z5189 Encounter for other specified aftercare: Secondary | ICD-10-CM | POA: Diagnosis not present

## 2014-07-08 DIAGNOSIS — R262 Difficulty in walking, not elsewhere classified: Secondary | ICD-10-CM | POA: Diagnosis not present

## 2014-07-08 DIAGNOSIS — M545 Low back pain: Secondary | ICD-10-CM | POA: Diagnosis not present

## 2014-07-08 DIAGNOSIS — R2681 Unsteadiness on feet: Secondary | ICD-10-CM | POA: Diagnosis not present

## 2014-07-08 DIAGNOSIS — M6281 Muscle weakness (generalized): Secondary | ICD-10-CM | POA: Diagnosis not present

## 2014-07-08 DIAGNOSIS — Z5189 Encounter for other specified aftercare: Secondary | ICD-10-CM | POA: Diagnosis not present

## 2014-07-08 DIAGNOSIS — R600 Localized edema: Secondary | ICD-10-CM | POA: Diagnosis not present

## 2014-07-09 ENCOUNTER — Ambulatory Visit: Payer: Medicare Other | Admitting: Physician Assistant

## 2014-07-09 ENCOUNTER — Encounter: Payer: Self-pay | Admitting: Adult Health

## 2014-07-09 ENCOUNTER — Non-Acute Institutional Stay (SKILLED_NURSING_FACILITY): Payer: Medicare Other | Admitting: Adult Health

## 2014-07-09 DIAGNOSIS — R600 Localized edema: Secondary | ICD-10-CM | POA: Diagnosis not present

## 2014-07-09 DIAGNOSIS — Z5189 Encounter for other specified aftercare: Secondary | ICD-10-CM | POA: Diagnosis not present

## 2014-07-09 DIAGNOSIS — R262 Difficulty in walking, not elsewhere classified: Secondary | ICD-10-CM | POA: Diagnosis not present

## 2014-07-09 DIAGNOSIS — K5901 Slow transit constipation: Secondary | ICD-10-CM | POA: Diagnosis not present

## 2014-07-09 DIAGNOSIS — Z7952 Long term (current) use of systemic steroids: Secondary | ICD-10-CM

## 2014-07-09 DIAGNOSIS — M6281 Muscle weakness (generalized): Secondary | ICD-10-CM | POA: Diagnosis not present

## 2014-07-09 DIAGNOSIS — M545 Low back pain, unspecified: Secondary | ICD-10-CM

## 2014-07-09 DIAGNOSIS — M47816 Spondylosis without myelopathy or radiculopathy, lumbar region: Secondary | ICD-10-CM | POA: Diagnosis not present

## 2014-07-09 DIAGNOSIS — R2681 Unsteadiness on feet: Secondary | ICD-10-CM | POA: Diagnosis not present

## 2014-07-09 NOTE — Progress Notes (Signed)
Patient ID: Sarah Mays, female   DOB: 1930/08/13, 81 y.o.   MRN: [id number]    Nursing Home Location:  [facility]   Place of Service: SNF (31)    Allergies  Allergen Reactions  . Amlodipine     Lower extremity edema     Chief Complaint  Patient presents with  . Acute Visit    f/u steroid taper    HPI:  Patient is a 81 y.o. female seen today at [facility] in the rehab section. She was admitted on 06/20/14 for pain management due to lumbar back pain from lumbar stenosis, degenerative changes, and disc protrusion. She has had two epidural injections, most recently on 05/03/14.  She is being followed by Dr. Mina Marble with orthopedics and may have an ablative procedure in the near future. She has been on prednisone for pain control which was titrated on 06/27/14 from 50mg  now currently on 30 mg daily. Her pain has remained the same by her accounts, located to the low back but not radiating. She was also started on Butran patch at 5 mcg two weeks ago. She reports that it wears off on day 6 or 7 before the patch is changed. She is requesting to use prn meds for pain. She is continuing to work with therapy and is ambulatory with a walker. She uses a back brace for support and ice prn for pain. She has had issues with constipation during her stay and reports normal BM's now with senna s BID and miralax.  Review of Systems:  Review of Systems  Constitutional: Positive for activity change. Negative for fever, chills, appetite change, fatigue and unexpected weight change.  HENT: Negative for congestion and trouble swallowing.   Respiratory: Negative for cough, shortness of breath and wheezing.   Cardiovascular: Positive for leg swelling. Negative for chest pain and palpitations.  Genitourinary: Negative for dysuria and difficulty urinating.  Musculoskeletal: Positive for back pain and gait problem.  Neurological: Negative for dizziness, seizures, facial  asymmetry, speech difficulty and headaches.  Psychiatric/Behavioral: Negative for behavioral problems, confusion and agitation.    Past Medical History  Diagnosis Date  . PAF (paroxysmal atrial fibrillation)     a. on amio (02/2012 mild obstruction on PFT's) and xarelto.  . Essential hypertension   . Mitral valve disorders   . Tricuspid valve disorders, specified as nonrheumatic   . Neck mass     right, w/u including MRI negative  . Hypothyroidism   . Macular degeneration     Left, s/p inj. tx  . Senile osteoporosis     Reclast in the past  . Alopecia, unspecified   . Pure hypercholesterolemia   . Chronic diastolic CHF (congestive heart failure)     a. 11/2013 Echo: EF 60-65%, no rwma, mild TR, PASP 95mmHg.  [name] Kitchen Headache(784.0)   . Backache, unspecified   . Polycythemia vera(238.4)   . Gouty arthropathy, unspecified   . Insomnia, unspecified   . Other malaise and fatigue   . Hyposmolality and/or hyponatremia   . Hyperpotassemia   . Edema   . Closed fracture of lower end of radius with ulna   . Hypercalcemia   . Deafness     Left, s/p multiple surgeries  . Abnormal stress test     a. 11/2011 Ex MV: EF 80%, small, partially reversible anteroapical defect->mild ischemia vs attenuation-->med Rx.   Past Surgical History  Procedure Laterality Date  . Breast biopsy  06/18/1998    left  .  Total abdominal hysterectomy    . Tonsillectomy    . Breast biopsy     Social History:   reports that she quit smoking about 30 years ago. Her smoking use included Cigarettes. She has never used smokeless tobacco. She reports that she drinks about 0.6 oz of alcohol per week. She reports that she does not use illicit drugs.  Family History  Problem Relation Age of Onset  . Hypertension Father   . Heart disease Father     patient does not know details.   . Breast cancer Daughter   . Cancer Daughter     breast  . Ovarian cancer Mother 30  . Cancer Sister     colon  . Heart disease Sister    . Cancer Sister   . Cancer Sister     hodgkin disease    Medications: Patient's Medications  New Prescriptions   No medications on file  Previous Medications   AMIODARONE (PACERONE) 200 MG TABLET    Take 1 tablet (200 mg total) by mouth daily.   BACLOFEN (LIORESAL) 10 MG TABLET    Take 0.5 tablets (5 mg total) by mouth at bedtime.   BIOTIN 5000 MCG TABS    Take 5,000 mcg by mouth daily.   BUPRENORPHINE 7.5 MCG/HR PTWK    Place 7.5 mcg onto the skin.   CHOLECALCIFEROL (VITAMIN D3) 400 UNITS CAPS    Take 1 capsule by mouth daily.   FLUTICASONE (FLONASE) 50 MCG/ACT NASAL SPRAY    Place 2 sprays into both nostrils daily.   FUROSEMIDE (LASIX) 40 MG TABLET    Take 1.5 tablets (60 mg total) by mouth 2 (two) times daily.   GABAPENTIN (NEURONTIN) 100 MG CAPSULE    Take 1 capsule (100 mg total) by mouth at bedtime.   GLUCOSAMINE-CHONDROITIN DS PO    Take 1 tablet by mouth daily.    HYDRALAZINE (APRESOLINE) 50 MG TABLET    Take 1.5 tablets (75 mg total) by mouth 3 (three) times daily.   HYDROXYUREA (HYDREA) 500 MG CAPSULE    TAKE 2 CAPSULES ON MONDAYS AND TAKE 1 CAPSULE ALL OTHER DAYS OF THE WEEK.   IBANDRONATE (BONIVA) 150 MG TABLET    TAKE ONE TABLET ONCE A MONTH IN THE MORNING ON AN EMPTY STOMACH WITH WATER. REMAIN UPRIGHT.   LEVOTHYROXINE (SYNTHROID, LEVOTHROID) 112 MCG TABLET    Take 112 mcg by mouth daily before breakfast.   MAGNESIUM 250 MG TABS    Take 1 tablet by mouth daily.   MELATONIN 5 MG CAPS    Take 5 mg by mouth at bedtime.   MULTIPLE VITAMINS-MINERALS (PRESERVISION AREDS PO)    Take 1 capsule by mouth daily.   POLYETHYLENE GLYCOL (MIRALAX / GLYCOLAX) PACKET    Take 17 g by mouth daily.   POTASSIUM CHLORIDE SA (K-DUR,KLOR-CON) 20 MEQ TABLET    Take 1 tablet (20 mEq total) by mouth daily.   PREDNISONE (DELTASONE) 20 MG TABLET    Take 20 mg by mouth daily with breakfast. Take 20mg  daily for 1 week then decrease to 10mg  daily   SENNA-DOCUSATE (SENOKOT-S) 8.6-50 MG PER TABLET    Take  1 tablet by mouth 2 (two) times daily.   TRAMADOL (ULTRAM) 50 MG TABLET    Take 1 tablet (50 mg total) by mouth every 6 (six) hours as needed.   XARELTO 15 MG TABS TABLET    TAKE 1 TABLET ONCE DAILY.  Modified Medications   No medications on file  Discontinued Medications   PREDNISONE (DELTASONE) 50 MG TABLET    Take 1 tablet daily.     Physical Exam: Filed Vitals:   07/09/14 1237  BP: 134/75  Pulse: 64    Physical Exam  Constitutional: She is oriented to person, place, and time. No distress.  HENT:  Head: Normocephalic and atraumatic.  Neck: No JVD present.  Right neck lipoma  Cardiovascular:  Irregular rhythm.    Pulmonary/Chest: Effort normal and breath sounds normal. No respiratory distress.  Abdominal: Soft. Bowel sounds are normal. She exhibits no distension. There is no tenderness.  Musculoskeletal: She exhibits tenderness (lumbar spine with decreased ROM).  Neurological: She is alert and oriented to person, place, and time. No cranial nerve deficit.  Skin: Skin is warm and dry. She is not diaphoretic.  Psychiatric: She has a normal mood and affect.    Labs reviewed: Basic Metabolic Panel:  Recent Labs  12/14/13 1430 12/26/13 1649 04/15/14 1530  NA 137 138 134*  K 3.4* 4.0 3.9  CL 97 102 95*  CO2 33* 29 35*  GLUCOSE 110* 97 92  BUN 24* 21 18  CREATININE 1.3* 1.2 1.03  CALCIUM 10.7* 10.7* 10.3   Liver Function Tests:  Recent Labs  11/19/13 1445 11/28/13 1626 04/15/14 1530  AST 16 26 13   ALT 13 28 15   ALKPHOS 59 77 103  BILITOT 0.9 0.8 0.4  PROT 7.2 6.9 6.9  ALBUMIN 4.1 4.0 4.1   No results for input(s): LIPASE, AMYLASE in the last 8760 hours. No results for input(s): AMMONIA in the last 8760 hours. CBC:  Recent Labs  10/11/13 1308  12/05/13 1157 04/15/14 1530 05/09/14 1302  WBC 7.8  < > 6.8 9.7 8.1  NEUTROABS 5.7  --   --  7.8* 6.1  HGB 12.2  < > 13.8 12.4 12.5  HCT 37.5  < > 41.5 36.9 38.4  MCV 115.8*  < > 112.9 Repeated and  verified X2.* 109.8* 111.9*  PLT 260  < > 317.0 508.0* 349  < > = values in this interval not displayed. TSH:  Recent Labs  07/20/13 1242 11/19/13 1445 05/15/14 1540  TSH 0.35 0.87 1.10   A1C: No results found for: HGBA1C Lipid Panel: No results for input(s): CHOL, HDL, LDLCALC, TRIG, CHOLHDL, LDLDIRECT in the last 8760 hours.  Radiological Exams:  Reviewed MRI 07/09/2014  from 04/15/14 1. Advanced degenerative lumbar spondylosis with multilevel disc disease and facet disease. 2. Left foraminal disc protrusion at L2-3 with mild left foraminal stenosis. 3. Shallow broad-based foraminal and extra foraminal disc protrusion on the left at L3-4 likely effecting both the left L3 and L4 nerve roots. 4. Mild bilateral lateral recess stenosis at L4-5. There is also a right foraminal and extra foraminal disc protrusion possibly affecting the left L4 nerve root.  Assessment/Plan   1. Bilateral low back pain without sciatica Persistent.  Increase Butrans patch to 7.86mcg, change weekly. Add back ultram prn for pain.  Continue to reduce prednisone slowly, 20mg  daily for 1 week, then 10 mg daily for 1 week. Then f/u with [facility].   2. Long term current use of systemic steroids BP and edema have improved, see above.  3. Slow transit constipation Stable on miralax and senna s      Cindi Carbon, ANP [facility] [phone]

## 2014-07-11 DIAGNOSIS — R600 Localized edema: Secondary | ICD-10-CM | POA: Diagnosis not present

## 2014-07-11 DIAGNOSIS — M545 Low back pain: Secondary | ICD-10-CM | POA: Diagnosis not present

## 2014-07-11 DIAGNOSIS — M6281 Muscle weakness (generalized): Secondary | ICD-10-CM | POA: Diagnosis not present

## 2014-07-11 DIAGNOSIS — Z5189 Encounter for other specified aftercare: Secondary | ICD-10-CM | POA: Diagnosis not present

## 2014-07-11 DIAGNOSIS — R2681 Unsteadiness on feet: Secondary | ICD-10-CM | POA: Diagnosis not present

## 2014-07-11 DIAGNOSIS — R262 Difficulty in walking, not elsewhere classified: Secondary | ICD-10-CM | POA: Diagnosis not present

## 2014-07-12 DIAGNOSIS — R262 Difficulty in walking, not elsewhere classified: Secondary | ICD-10-CM | POA: Diagnosis not present

## 2014-07-12 DIAGNOSIS — R2681 Unsteadiness on feet: Secondary | ICD-10-CM | POA: Diagnosis not present

## 2014-07-12 DIAGNOSIS — M545 Low back pain: Secondary | ICD-10-CM | POA: Diagnosis not present

## 2014-07-12 DIAGNOSIS — Z5189 Encounter for other specified aftercare: Secondary | ICD-10-CM | POA: Diagnosis not present

## 2014-07-12 DIAGNOSIS — R600 Localized edema: Secondary | ICD-10-CM | POA: Diagnosis not present

## 2014-07-12 DIAGNOSIS — M6281 Muscle weakness (generalized): Secondary | ICD-10-CM | POA: Diagnosis not present

## 2014-07-15 DIAGNOSIS — R600 Localized edema: Secondary | ICD-10-CM | POA: Diagnosis not present

## 2014-07-15 DIAGNOSIS — M6281 Muscle weakness (generalized): Secondary | ICD-10-CM | POA: Diagnosis not present

## 2014-07-15 DIAGNOSIS — R2681 Unsteadiness on feet: Secondary | ICD-10-CM | POA: Diagnosis not present

## 2014-07-15 DIAGNOSIS — R262 Difficulty in walking, not elsewhere classified: Secondary | ICD-10-CM | POA: Diagnosis not present

## 2014-07-15 DIAGNOSIS — M545 Low back pain: Secondary | ICD-10-CM | POA: Diagnosis not present

## 2014-07-15 DIAGNOSIS — Z5189 Encounter for other specified aftercare: Secondary | ICD-10-CM | POA: Diagnosis not present

## 2014-07-16 DIAGNOSIS — R600 Localized edema: Secondary | ICD-10-CM | POA: Diagnosis not present

## 2014-07-16 DIAGNOSIS — Z5189 Encounter for other specified aftercare: Secondary | ICD-10-CM | POA: Diagnosis not present

## 2014-07-16 DIAGNOSIS — M545 Low back pain: Secondary | ICD-10-CM | POA: Diagnosis not present

## 2014-07-16 DIAGNOSIS — M6281 Muscle weakness (generalized): Secondary | ICD-10-CM | POA: Diagnosis not present

## 2014-07-16 DIAGNOSIS — R262 Difficulty in walking, not elsewhere classified: Secondary | ICD-10-CM | POA: Diagnosis not present

## 2014-07-16 DIAGNOSIS — R2681 Unsteadiness on feet: Secondary | ICD-10-CM | POA: Diagnosis not present

## 2014-07-17 DIAGNOSIS — R262 Difficulty in walking, not elsewhere classified: Secondary | ICD-10-CM | POA: Diagnosis not present

## 2014-07-17 DIAGNOSIS — R600 Localized edema: Secondary | ICD-10-CM | POA: Diagnosis not present

## 2014-07-17 DIAGNOSIS — R2681 Unsteadiness on feet: Secondary | ICD-10-CM | POA: Diagnosis not present

## 2014-07-17 DIAGNOSIS — Z5189 Encounter for other specified aftercare: Secondary | ICD-10-CM | POA: Diagnosis not present

## 2014-07-17 DIAGNOSIS — M6281 Muscle weakness (generalized): Secondary | ICD-10-CM | POA: Diagnosis not present

## 2014-07-17 DIAGNOSIS — M545 Low back pain: Secondary | ICD-10-CM | POA: Diagnosis not present

## 2014-07-18 DIAGNOSIS — R2681 Unsteadiness on feet: Secondary | ICD-10-CM | POA: Diagnosis not present

## 2014-07-18 DIAGNOSIS — R262 Difficulty in walking, not elsewhere classified: Secondary | ICD-10-CM | POA: Diagnosis not present

## 2014-07-18 DIAGNOSIS — M545 Low back pain: Secondary | ICD-10-CM | POA: Diagnosis not present

## 2014-07-18 DIAGNOSIS — R600 Localized edema: Secondary | ICD-10-CM | POA: Diagnosis not present

## 2014-07-18 DIAGNOSIS — M6281 Muscle weakness (generalized): Secondary | ICD-10-CM | POA: Diagnosis not present

## 2014-07-18 DIAGNOSIS — Z5189 Encounter for other specified aftercare: Secondary | ICD-10-CM | POA: Diagnosis not present

## 2014-07-19 ENCOUNTER — Non-Acute Institutional Stay (SKILLED_NURSING_FACILITY): Payer: Medicare Other | Admitting: Adult Health

## 2014-07-19 ENCOUNTER — Telehealth: Payer: Self-pay | Admitting: *Deleted

## 2014-07-19 ENCOUNTER — Encounter: Payer: Self-pay | Admitting: Adult Health

## 2014-07-19 ENCOUNTER — Encounter: Payer: Self-pay | Admitting: Physician Assistant

## 2014-07-19 ENCOUNTER — Ambulatory Visit (INDEPENDENT_AMBULATORY_CARE_PROVIDER_SITE_OTHER): Payer: Medicare Other | Admitting: Physician Assistant

## 2014-07-19 VITALS — BP 140/50 | HR 61 | Ht 62.0 in | Wt 120.0 lb

## 2014-07-19 DIAGNOSIS — Z7952 Long term (current) use of systemic steroids: Secondary | ICD-10-CM | POA: Diagnosis not present

## 2014-07-19 DIAGNOSIS — N179 Acute kidney failure, unspecified: Secondary | ICD-10-CM

## 2014-07-19 DIAGNOSIS — M545 Low back pain, unspecified: Secondary | ICD-10-CM

## 2014-07-19 DIAGNOSIS — I2510 Atherosclerotic heart disease of native coronary artery without angina pectoris: Secondary | ICD-10-CM | POA: Diagnosis not present

## 2014-07-19 DIAGNOSIS — R0602 Shortness of breath: Secondary | ICD-10-CM

## 2014-07-19 DIAGNOSIS — I10 Essential (primary) hypertension: Secondary | ICD-10-CM

## 2014-07-19 DIAGNOSIS — M6281 Muscle weakness (generalized): Secondary | ICD-10-CM | POA: Diagnosis not present

## 2014-07-19 DIAGNOSIS — I480 Paroxysmal atrial fibrillation: Secondary | ICD-10-CM | POA: Diagnosis not present

## 2014-07-19 DIAGNOSIS — K5901 Slow transit constipation: Secondary | ICD-10-CM

## 2014-07-19 DIAGNOSIS — I5032 Chronic diastolic (congestive) heart failure: Secondary | ICD-10-CM

## 2014-07-19 DIAGNOSIS — R2681 Unsteadiness on feet: Secondary | ICD-10-CM | POA: Diagnosis not present

## 2014-07-19 DIAGNOSIS — R262 Difficulty in walking, not elsewhere classified: Secondary | ICD-10-CM | POA: Diagnosis not present

## 2014-07-19 DIAGNOSIS — R600 Localized edema: Secondary | ICD-10-CM | POA: Diagnosis not present

## 2014-07-19 DIAGNOSIS — Z5189 Encounter for other specified aftercare: Secondary | ICD-10-CM | POA: Diagnosis not present

## 2014-07-19 LAB — BASIC METABOLIC PANEL
BUN: 22 mg/dL (ref 6–23)
CALCIUM: 10.7 mg/dL — AB (ref 8.4–10.5)
CO2: 32 meq/L (ref 19–32)
Chloride: 96 mEq/L (ref 96–112)
Creatinine, Ser: 0.85 mg/dL (ref 0.40–1.20)
GFR: 68.06 mL/min (ref >60.00)
Glucose, Bld: 87 mg/dL (ref 70–99)
Potassium: 4.1 mEq/L (ref 3.5–5.1)
SODIUM: 136 meq/L (ref 135–145)

## 2014-07-19 LAB — BRAIN NATRIURETIC PEPTIDE: Pro B Natriuretic peptide (BNP): 330 pg/mL — ABNORMAL HIGH (ref 0.0–100.0)

## 2014-07-19 MED ORDER — FUROSEMIDE 40 MG PO TABS: 80.0000 mg | ORAL_TABLET | Freq: Two times a day (BID) | ORAL | Status: DC

## 2014-07-19 NOTE — Telephone Encounter (Signed)
lmptcb for lab results and med change

## 2014-07-19 NOTE — Progress Notes (Signed)
Cardiology Office Note   Date:  07/19/2014   ID:  Sarah Mays, DOB 1930/08/01, MRN [id number]  PCP:  Garret Reddish, MD  Cardiologist:  Dr. Loralie Champagne   Electrophysiologist:  Dr. Thompson Grayer   Chief Complaint  Patient presents with  . Congestive Heart Failure     History of Present Illness: Sarah Mays is a 81 y.o. female with a hx of PAF, diastolic HF, HTN, polycythemia vera. She is maintaining NSR on amiodarone. She takes Xarelto for stroke prophylaxis. She has seen Dr. Rayann Heman for sick sinus syndrome. Heart rates have remained stable and the goal is to avoid pacemaker if at all possible.   She saw Ignacia Bayley, NP 06/21/14. She lives at [facility]. She had recently been moved from assisted living to rehabilitation secondary to ongoing back pain and limited mobility. When she saw Gerald Stabs, her weight was up 10 pounds and she was felt to be volume overloaded. Her Lasix was increased. She returns for close follow-up.  She notes improved LE edema. She denies significant dyspnea. She walks with a rolling walker. She denies orthopnea or PND. She denies chest discomfort. She denies dizziness, near-syncope or syncope. Weights have largely remained unchanged.   Studies/Reports Reviewed Today:  Holter 12/05/13 NSR, atrial fibrillation, bradycardia max 2.4 second pause  Echo 12/05/13 EF 60-65%, no RWMA LA upper limits of normal Mild RAE Mild TR PASP 27 mmHg  Myoview 12/10/11 Possible slight anterior ischemia, EF 80%  Past Medical History  Diagnosis Date  . PAF (paroxysmal atrial fibrillation)     a. on amio (02/2012 mild obstruction on PFT's) and xarelto.  . Essential hypertension   . Mitral valve disorders   . Tricuspid valve disorders, specified as nonrheumatic   . Neck mass     right, w/u including MRI negative  . Hypothyroidism   . Macular degeneration     Left, s/p inj. tx  . Senile osteoporosis     Reclast in the past  . Alopecia, unspecified   . Pure  hypercholesterolemia   . Chronic diastolic CHF (congestive heart failure)     a. 11/2013 Echo: EF 60-65%, no rwma, mild TR, PASP 10mmHg.  [name] Kitchen Headache(784.0)   . Backache, unspecified   . Polycythemia vera(238.4)   . Gouty arthropathy, unspecified   . Insomnia, unspecified   . Other malaise and fatigue   . Hyposmolality and/or hyponatremia   . Hyperpotassemia   . Edema   . Closed fracture of lower end of radius with ulna   . Hypercalcemia   . Deafness     Left, s/p multiple surgeries  . Abnormal stress test     a. 11/2011 Ex MV: EF 80%, small, partially reversible anteroapical defect->mild ischemia vs attenuation-->med Rx.  1. Atrial fibrillation: Diagnosed initially in 10/13. Holter monitor in 11/13 showed atrial fibrillation with average rate 65. Back in NSR on amiodarone.  2. Chronic diastolic CHF: Echo (63/14) with EF 65-70%, mild MR, moderate biatrial enlargement, moderate TR, PA systolic pressure 45 mmHg. Echo (10/15): EF 60-65%.  3. ETT-Sestamibi (11/13): Exercised to stage II, small partially reversible anteroapical perfusion defect suggesting mild ischemia versus attenuation, EF 80%.  4. HTN: Lower extremity swelling with amlodipine.  5. Polycythemia vera: Has had phlebotomy only once.  6. Hypothyroidism 7. Macular degeneration 8. TAH 1980 9. Familial hypocalciuric hypercalcemia 10. PFTs (amiodarone use) in 1/14 showed a mild obstructive defect.  11. Degenerative disc disease   Past Surgical History  Procedure Laterality Date  . Breast  biopsy  06/18/1998    left  . Total abdominal hysterectomy    . Tonsillectomy    . Breast biopsy       Current Outpatient Prescriptions  Medication Sig Dispense Refill  . amiodarone (PACERONE) 200 MG tablet Take 1 tablet (200 mg total) by mouth daily. 30 tablet 6  . baclofen (LIORESAL) 10 MG tablet Take 0.5 tablets (5 mg total) by mouth at bedtime. 30 each 0  . Biotin 5000 MCG TABS Take 5,000 mcg by mouth daily.    .  Cholecalciferol (VITAMIN D3) 400 UNITS CAPS Take 1 capsule by mouth daily.    . fentaNYL (DURAGESIC - DOSED MCG/HR) 12 MCG/HR Place 12.5 mcg onto the skin every 3 (three) days.    . furosemide (LASIX) 40 MG tablet Take 1.5 tablets (60 mg total) by mouth 2 (two) times daily. 90 tablet 6  . gabapentin (NEURONTIN) 100 MG capsule Take 1 capsule (100 mg total) by mouth at bedtime. 30 capsule 3  . GLUCOSAMINE-CHONDROITIN DS PO Take 1 tablet by mouth daily.     . hydrALAZINE (APRESOLINE) 50 MG tablet Take 1.5 tablets (75 mg total) by mouth 3 (three) times daily. 135 tablet 6  . hydroxyurea (HYDREA) 500 MG capsule TAKE 2 CAPSULES ON MONDAYS AND TAKE 1 CAPSULE ALL OTHER DAYS OF THE WEEK. (Patient taking differently: TAKE 2 CAPSULES BY MOUTH ON MONDAYS AND TAKE 1 CAPSULE BY MOUTH ALL OTHER DAYS OF THE WEEK.) 35 capsule 6  . ibandronate (BONIVA) 150 MG tablet TAKE ONE TABLET ONCE A MONTH IN THE MORNING ON AN EMPTY STOMACH WITH WATER. REMAIN UPRIGHT. 1 tablet 6  . levothyroxine (SYNTHROID, LEVOTHROID) 112 MCG tablet Take 112 mcg by mouth daily before breakfast.    . Magnesium 250 MG TABS Take 1 tablet by mouth daily.    . Melatonin 5 MG CAPS Take 5 mg by mouth at bedtime.    . Multiple Vitamins-Minerals (PRESERVISION AREDS PO) Take 1 capsule by mouth daily.    . polyethylene glycol (MIRALAX / GLYCOLAX) packet Take 17 g by mouth daily.    . potassium chloride SA (K-DUR,KLOR-CON) 20 MEQ tablet Take 1 tablet (20 mEq total) by mouth daily. 30 tablet 6  . predniSONE (DELTASONE) 10 MG tablet Take 10 mg by mouth daily with breakfast.    . senna-docusate (SENOKOT-S) 8.6-50 MG per tablet Take 2 tablets by mouth 2 (two) times daily.     . traMADol (ULTRAM) 50 MG tablet Take 1 tablet (50 mg total) by mouth every 6 (six) hours as needed. 30 tablet 1  . XARELTO 15 MG TABS tablet TAKE 1 TABLET ONCE DAILY. 30 tablet 3   No current facility-administered medications for this visit.    Allergies:   Amlodipine    Social  History:  The patient  reports that she quit smoking about 30 years ago. Her smoking use included Cigarettes. She has never used smokeless tobacco. She reports that she drinks about 0.6 oz of alcohol per week. She reports that she does not use illicit drugs.   Family History:  The patient's family history includes Breast cancer in her daughter; Cancer in her daughter, sister, sister, and sister; Heart disease in her father and sister; Hypertension in her father; Ovarian cancer (age of onset: 23) in her mother.    ROS:   Please see the history of present illness.   Review of Systems  All other systems reviewed and are negative.    PHYSICAL EXAM: VS:  BP 140/50  mmHg  Pulse 61  Ht 5\' 2"  (1.575 m)  Wt 120 lb (54.432 kg)  BMI 21.94 kg/m2    Wt Readings from Last 3 Encounters:  07/19/14 120 lb (54.432 kg)  06/25/14 124 lb 12.8 oz (56.609 kg)  06/24/14 124 lb 12.8 oz (56.609 kg)     GEN: Well nourished, well developed, in no acute distress HEENT: normal Neck: no JVD at 90, no masses Cardiac:  Normal S1/S2, RRR; no murmur,no rubs or gallops, no edema  Respiratory:  clear to auscultation bilaterally, no wheezing, rhonchi or rales. GI: soft, nontender, nondistended, + BS MS: no deformity or atrophy Skin: warm and dry  Neuro:  CNs II-XII intact, Strength and sensation are intact Psych: Normal affect   EKG:  EKG is not ordered today.  It demonstrates:   n/a   Recent Labs: 12/14/2013: Pro B Natriuretic peptide (BNP) 225.0* 04/15/2014: ALT 15; BUN 18; Creatinine, Ser 1.03; Potassium 3.9; Sodium 134* 05/09/2014: HGB 12.5; Platelets 349 05/15/2014: TSH 1.10    Lipid Panel    Component Value Date/Time   CHOL 189 02/22/2012 1218   TRIG 92.0 02/22/2012 1218   HDL 85.90 02/22/2012 1218   CHOLHDL 2 02/22/2012 1218   VLDL 18.4 02/22/2012 1218   [place] 85 02/22/2012 1218      ASSESSMENT AND PLAN:  Chronic diastolic CHF (congestive heart failure):  She does not look particularly  volume overloaded on exam. Her weights have remained largely unchanged. I will check a follow-up basic metabolic panel and BNP. If her BNP is significantly elevated, consider adjusting Lasix further. Otherwise, continue current therapy.  Coronary artery disease involving native coronary artery of native heart without angina pectoris:  She had an ETT-Sestamibi in 11/13 that showed a small partially reversible anteroapical perfusion defect.  This may be a small area of ischemia but soft tissue attenuation is certainly also possible.  This does not sound like a high risk study.  Would continue to manage medically. She is not complaining of angina.  Paroxysmal atrial fibrillation:  She has maintained NSR on amiodarone. Continue Xarelto. Recent TSH and ALT okay.  Essential hypertension: Controlled.    Bilateral low back pain without sciatica:  Back pain somewhat improved. She notes that she is moving back to her independent living soon.     Current medicines are reviewed at length with the patient today.  Concerns regarding medicines are as outlined above.  The following changes have been made:    None    Labs/ tests ordered today include:   Orders Placed This Encounter  Procedures  . Basic metabolic panel  . Brain natriuretic peptide    Disposition:   FU with Dr. Loralie Champagne 10/2014 as planned.    Signed, Versie Starks, MHS 07/19/2014 12:43 PM    [facility] [address], [place], [place]  [address] Phone: [phone]; Fax: [phone]

## 2014-07-19 NOTE — Patient Instructions (Signed)
Medication Instructions:  No changes today.   Labwork: BMET and BNP today.  Dx:  Diastolic CHF  Testing/Procedures: None   Follow-Up: Schedule follow up with Dr. Loralie Champagne in 10/2014 or sooner if needed.   Any Other Special Instructions Will Be Listed Below (If Applicable).

## 2014-07-19 NOTE — Progress Notes (Signed)
Patient ID: Sarah Mays, female   DOB: 03-30-1930, 81 y.o.   MRN: [id number]    Nursing Home Location:  [place] of Service: SNF [phone])  Patient Care Team: Marin Olp, MD as PCP - General (Family Medicine) [facility] Hurman Horn, MD as Consulting Physician (Ophthalmology) Larey Dresser, MD as Consulting Physician (Cardiology) Heath Lark, MD as Consulting Physician (Hematology and Oncology)   Allergies  Allergen Reactions  . Amlodipine     Lower extremity edema     Chief Complaint  Patient presents with  . Acute Visit    f/u pain management    HPI:  Patient is a 82 y.o. female seen today at [facility] in the rehab section. She was admitted on 06/20/14 for pain management due to lumbar back pain from lumbar stenosis, degenerative changes, and disc protrusion. She has had two epidural injections, most recently on 05/03/14.  She is being followed by Dr. Mina Marble with orthopedics and may have an ablative procedure in the near future. She has been on prednisone for pain control which was titrated down to 10 mg daily on 07/17/14. Her pain has increased with the decreased prednisone dosing. She is not interested in staying on prednisone due to her edema, elevated BP, and cardiac hx. She has been on a titrating dose of a Butrans patch but it has not controlled her pain and tends to wear off after the patch has been on for 5 days. She is using Ultram 1-3 times a day for low back pain that is non radicular at this point. She saw Dr. Mina Marble on 5/31 for a medial branch block of L 2,3, 4. She tells me this procedure is to help evaluate her for ablative surgery. She will go back to Dr. Mina Marble later this month for evaluation Her plan of care is to go home on Monday. She is able to ambulate with a walker and dress herself. The staff reports that her pain impedes her ability to self care and that she will need better control to  return home independently.  She reports constipation and is currently on Miralax 17 gm qd and senna s 1 tab BID.    Review of Systems:  Review of Systems  Constitutional: Positive for activity change. Negative for fever, chills, appetite change, fatigue and unexpected weight change.  HENT: Negative for congestion and trouble swallowing.   Respiratory: Negative for cough, shortness of breath and wheezing.   Cardiovascular: Positive for leg swelling. Negative for chest pain and palpitations.  Gastrointestinal: Positive for constipation. Negative for abdominal pain and abdominal distention.  Genitourinary: Negative for dysuria and difficulty urinating.  Musculoskeletal: Positive for back pain and gait problem.  Neurological: Negative for dizziness, seizures, facial asymmetry, speech difficulty and headaches.  Psychiatric/Behavioral: Negative for behavioral problems, confusion and agitation.    Past Medical History  Diagnosis Date  . PAF (paroxysmal atrial fibrillation)     a. on amio (02/2012 mild obstruction on PFT's) and xarelto.  . Essential hypertension   . Mitral valve disorders   . Tricuspid valve disorders, specified as nonrheumatic   . Neck mass     right, w/u including MRI negative  . Hypothyroidism   . Macular degeneration     Left, s/p inj. tx  . Senile osteoporosis     Reclast in the past  . Alopecia, unspecified   . Pure hypercholesterolemia   . Chronic diastolic CHF (congestive heart failure)  a. 11/2013 Echo: EF 60-65%, no rwma, mild TR, PASP 62mmHg.  [name] Kitchen Headache(784.0)   . Backache, unspecified   . Polycythemia vera(238.4)   . Gouty arthropathy, unspecified   . Insomnia, unspecified   . Other malaise and fatigue   . Hyposmolality and/or hyponatremia   . Hyperpotassemia   . Edema   . Closed fracture of lower end of radius with ulna   . Hypercalcemia   . Deafness     Left, s/p multiple surgeries  . Abnormal stress test     a. 11/2011 Ex MV: EF 80%, small,  partially reversible anteroapical defect->mild ischemia vs attenuation-->med Rx.   Past Surgical History  Procedure Laterality Date  . Breast biopsy  06/18/1998    left  . Total abdominal hysterectomy    . Tonsillectomy    . Breast biopsy     Social History:   reports that she quit smoking about 30 years ago. Her smoking use included Cigarettes. She has never used smokeless tobacco. She reports that she drinks about 0.6 oz of alcohol per week. She reports that she does not use illicit drugs.  Family History  Problem Relation Age of Onset  . Hypertension Father   . Heart disease Father     patient does not know details.   . Breast cancer Daughter   . Cancer Daughter     breast  . Ovarian cancer Mother 39  . Cancer Sister     colon  . Heart disease Sister   . Cancer Sister   . Cancer Sister     hodgkin disease    Medications: Patient's Medications  New Prescriptions   No medications on file  Previous Medications   AMIODARONE (PACERONE) 200 MG TABLET    Take 1 tablet (200 mg total) by mouth daily.   BACLOFEN (LIORESAL) 10 MG TABLET    Take 0.5 tablets (5 mg total) by mouth at bedtime.   BIOTIN 5000 MCG TABS    Take 5,000 mcg by mouth daily.   CHOLECALCIFEROL (VITAMIN D3) 400 UNITS CAPS    Take 1 capsule by mouth daily.   FLUTICASONE (FLONASE) 50 MCG/ACT NASAL SPRAY    Place 2 sprays into both nostrils daily.   FUROSEMIDE (LASIX) 40 MG TABLET    Take 1.5 tablets (60 mg total) by mouth 2 (two) times daily.   GABAPENTIN (NEURONTIN) 100 MG CAPSULE    Take 1 capsule (100 mg total) by mouth at bedtime.   GLUCOSAMINE-CHONDROITIN DS PO    Take 1 tablet by mouth daily.    HYDRALAZINE (APRESOLINE) 50 MG TABLET    Take 1.5 tablets (75 mg total) by mouth 3 (three) times daily.   HYDROXYUREA (HYDREA) 500 MG CAPSULE    TAKE 2 CAPSULES ON MONDAYS AND TAKE 1 CAPSULE ALL OTHER DAYS OF THE WEEK.   IBANDRONATE (BONIVA) 150 MG TABLET    TAKE ONE TABLET ONCE A MONTH IN THE MORNING ON AN EMPTY  STOMACH WITH WATER. REMAIN UPRIGHT.   LEVOTHYROXINE (SYNTHROID, LEVOTHROID) 112 MCG TABLET    Take 112 mcg by mouth daily before breakfast.   MAGNESIUM 250 MG TABS    Take 1 tablet by mouth daily.   MELATONIN 5 MG CAPS    Take 5 mg by mouth at bedtime.   MULTIPLE VITAMINS-MINERALS (PRESERVISION AREDS PO)    Take 1 capsule by mouth daily.   POLYETHYLENE GLYCOL (MIRALAX / GLYCOLAX) PACKET    Take 17 g by mouth daily.   POTASSIUM CHLORIDE SA (  K-DUR,KLOR-CON) 20 MEQ TABLET    Take 1 tablet (20 mEq total) by mouth daily.   PREDNISONE (DELTASONE) 10 MG TABLET    Take 10 mg by mouth daily with breakfast.   SENNA-DOCUSATE (SENOKOT-S) 8.6-50 MG PER TABLET    Take 2 tablets by mouth 2 (two) times daily.    TRAMADOL (ULTRAM) 50 MG TABLET    Take 1 tablet (50 mg total) by mouth every 6 (six) hours as needed.   XARELTO 15 MG TABS TABLET    TAKE 1 TABLET ONCE DAILY.  Modified Medications   No medications on file  Discontinued Medications   BUPRENORPHINE 7.5 MCG/HR PTWK    Place 7.5 mcg onto the skin.   PREDNISONE (DELTASONE) 20 MG TABLET    Take 20 mg by mouth daily with breakfast. Take 20mg  daily for 1 week then decrease to 10mg  daily     Physical Exam: Filed Vitals:   07/19/14 0956  BP: 129/72  Pulse: 71    Physical Exam  Constitutional: She is oriented to person, place, and time. No distress.  HENT:  Head: Normocephalic and atraumatic.  Neck: No JVD present.  Right neck lipoma  Cardiovascular:  Irregular rhythm.    Pulmonary/Chest: Effort normal and breath sounds normal. No respiratory distress.  Abdominal: Soft. Bowel sounds are normal. She exhibits no distension. There is no tenderness.  Musculoskeletal: She exhibits tenderness (lumbar spine with decreased ROM).  Neurological: She is alert and oriented to person, place, and time. No cranial nerve deficit.  Skin: Skin is warm and dry. She is not diaphoretic.  Psychiatric: She has a normal mood and affect.    Labs reviewed: Basic  Metabolic Panel:  Recent Labs  12/14/13 1430 12/26/13 1649 04/15/14 1530  NA 137 138 134*  K 3.4* 4.0 3.9  CL 97 102 95*  CO2 33* 29 35*  GLUCOSE 110* 97 92  BUN 24* 21 18  CREATININE 1.3* 1.2 1.03  CALCIUM 10.7* 10.7* 10.3   Liver Function Tests:  Recent Labs  11/19/13 1445 11/28/13 1626 04/15/14 1530  AST 16 26 13   ALT 13 28 15   ALKPHOS 59 77 103  BILITOT 0.9 0.8 0.4  PROT 7.2 6.9 6.9  ALBUMIN 4.1 4.0 4.1   No results for input(s): LIPASE, AMYLASE in the last 8760 hours. No results for input(s): AMMONIA in the last 8760 hours. CBC:  Recent Labs  10/11/13 1308  12/05/13 1157 04/15/14 1530 05/09/14 1302  WBC 7.8  < > 6.8 9.7 8.1  NEUTROABS 5.7  --   --  7.8* 6.1  HGB 12.2  < > 13.8 12.4 12.5  HCT 37.5  < > 41.5 36.9 38.4  MCV 115.8*  < > 112.9 Repeated and verified X2.* 109.8* 111.9*  PLT 260  < > 317.0 508.0* 349  < > = values in this interval not displayed. TSH:  Recent Labs  07/20/13 1242 11/19/13 1445 05/15/14 1540  TSH 0.35 0.87 1.10   A1C: No results found for: HGBA1C Lipid Panel: No results for input(s): CHOL, HDL, LDLCALC, TRIG, CHOLHDL, LDLDIRECT in the last 8760 hours.  Radiological Exams:  Reviewed MRI 07/19/2014  from 04/15/14 1. Advanced degenerative lumbar spondylosis with multilevel disc disease and facet disease. 2. Left foraminal disc protrusion at L2-3 with mild left foraminal stenosis. 3. Shallow broad-based foraminal and extra foraminal disc protrusion on the left at L3-4 likely effecting both the left L3 and L4 nerve roots. 4. Mild bilateral lateral recess stenosis at L4-5. There is  also a right foraminal and extra foraminal disc protrusion possibly affecting the left L4 nerve root.  Assessment/Plan   1. Bilateral low back pain without sciatica Persistent and interfering with her ADLS.  D/C Butrans and begin Fentanyl 12 mcg patch, change q 3 days. We will increase to 18mcg on Monday if she shoes no improvement in pain.  I will continue her on 10 mg of prednisone until her pain is better controlled and we can continue her taper outpt. I agree with her decision overall to come off prednisone due to the issues she has had with her BP and edema.  2. Long term current use of systemic steroids BP and edema have improved, see above. She is using compression hose.   3. Slow transit constipation Uncontrolled. Continue on miralax and increase senna s to 2 tabs BID.       Cindi Carbon, ANP [facility] [phone]

## 2014-07-19 NOTE — Telephone Encounter (Signed)
DPR on file to s/w daughter Manuela Schwartz. Manuela Schwartz notified of lab results and to increase lasix to 80 mg BID, bmet, bnp 6/17 here in our office. Daughter agreeable to plan of care.

## 2014-07-22 ENCOUNTER — Encounter: Payer: Self-pay | Admitting: Adult Health

## 2014-07-22 ENCOUNTER — Non-Acute Institutional Stay (SKILLED_NURSING_FACILITY): Payer: Medicare Other | Admitting: Adult Health

## 2014-07-22 DIAGNOSIS — E034 Atrophy of thyroid (acquired): Secondary | ICD-10-CM | POA: Diagnosis not present

## 2014-07-22 DIAGNOSIS — R2681 Unsteadiness on feet: Secondary | ICD-10-CM | POA: Diagnosis not present

## 2014-07-22 DIAGNOSIS — D45 Polycythemia vera: Secondary | ICD-10-CM | POA: Diagnosis not present

## 2014-07-22 DIAGNOSIS — M810 Age-related osteoporosis without current pathological fracture: Secondary | ICD-10-CM

## 2014-07-22 DIAGNOSIS — Z7952 Long term (current) use of systemic steroids: Secondary | ICD-10-CM

## 2014-07-22 DIAGNOSIS — Z5189 Encounter for other specified aftercare: Secondary | ICD-10-CM | POA: Diagnosis not present

## 2014-07-22 DIAGNOSIS — M6281 Muscle weakness (generalized): Secondary | ICD-10-CM | POA: Diagnosis not present

## 2014-07-22 DIAGNOSIS — I10 Essential (primary) hypertension: Secondary | ICD-10-CM | POA: Diagnosis not present

## 2014-07-22 DIAGNOSIS — I5032 Chronic diastolic (congestive) heart failure: Secondary | ICD-10-CM | POA: Diagnosis not present

## 2014-07-22 DIAGNOSIS — M5440 Lumbago with sciatica, unspecified side: Secondary | ICD-10-CM | POA: Diagnosis not present

## 2014-07-22 DIAGNOSIS — M545 Low back pain: Secondary | ICD-10-CM | POA: Diagnosis not present

## 2014-07-22 DIAGNOSIS — R262 Difficulty in walking, not elsewhere classified: Secondary | ICD-10-CM | POA: Diagnosis not present

## 2014-07-22 DIAGNOSIS — E038 Other specified hypothyroidism: Secondary | ICD-10-CM

## 2014-07-22 DIAGNOSIS — I480 Paroxysmal atrial fibrillation: Secondary | ICD-10-CM

## 2014-07-22 DIAGNOSIS — R600 Localized edema: Secondary | ICD-10-CM | POA: Diagnosis not present

## 2014-07-22 NOTE — Progress Notes (Signed)
Patient ID: Sarah Mays, female   DOB: [birth year], 81 y.o.   MRN: [id number]    Nursing Home Location:  [place] of Service: SNF [phone])  Patient Care Team: Marin Olp, MD as PCP - General (Family Medicine) [facility] Hurman Horn, MD as Consulting Physician (Ophthalmology) Larey Dresser, MD as Consulting Physician (Cardiology) Heath Lark, MD as Consulting Physician (Hematology and Oncology)   Allergies  Allergen Reactions  . Amlodipine     Lower extremity edema     Chief Complaint  Patient presents with  . Discharge Note    HPI:  Patient is a 81 y.o. female seen today at [facility] in the rehab section. She was admitted on 06/20/14 for pain management due to lumbar back pain from lumbar stenosis, degenerative changes, and disc protrusion. She has had two epidural injections, most recently on 05/03/14.  She is being followed by Dr. Mina Marble with orthopedics and may have an ablative procedure in the near future. She has been on prednisone for pain control which was titrated down to 10 mg daily on 07/17/14.  She is not interested in staying on prednisone due to her edema, elevated BP, and cardiac hx.She saw Dr. Mina Marble on 5/31 for a medial branch block of L 2,3, 4. She tells me this procedure is to help evaluate her for ablative surgery. She will go back to Dr. Mina Marble later this month for evaluation On 6/9 she was started on a Fentanyl patch 12 mcg. She reports some improvement in pain. She was previously on Butrans and felt that it kept wearing off despite dose increase. She continues to need prn ultram 1-3 times daily for low back pain that is not radicular.  She has chronic constipation and her senna s was increased to 2 tabs BID on 6/10 but she has not received the increased dosing and now reports no BM for 2 days.  She continues to work with therapy and is able to walk with a walker and dress her self.    Review of Systems:  Review of Systems  Constitutional: Negative for fever, chills, activity change, appetite change, fatigue and unexpected weight change.  HENT: Negative for congestion and trouble swallowing.   Respiratory: Negative for cough, shortness of breath and wheezing.   Cardiovascular: Positive for leg swelling. Negative for chest pain and palpitations.  Gastrointestinal: Positive for constipation. Negative for abdominal pain and abdominal distention.  Genitourinary: Negative for dysuria and difficulty urinating.  Musculoskeletal: Positive for back pain and gait problem.  Neurological: Negative for dizziness, seizures, facial asymmetry, speech difficulty and headaches.  Psychiatric/Behavioral: Negative for behavioral problems, confusion and agitation.    Past Medical History  Diagnosis Date  . PAF (paroxysmal atrial fibrillation)     a. on amio (02/2012 mild obstruction on PFT's) and xarelto.  . Essential hypertension   . Mitral valve disorders   . Tricuspid valve disorders, specified as nonrheumatic   . Neck mass     right, w/u including MRI negative  . Hypothyroidism   . Macular degeneration     Left, s/p inj. tx  . Senile osteoporosis     Reclast in the past  . Alopecia, unspecified   . Pure hypercholesterolemia   . Chronic diastolic CHF (congestive heart failure)     a. 11/2013 Echo: EF 60-65%, no rwma, mild TR, PASP 61mmHg.  [name] Kitchen Headache(784.0)   . Backache, unspecified   . Polycythemia vera(238.4)   .  Gouty arthropathy, unspecified   . Insomnia, unspecified   . Other malaise and fatigue   . Hyposmolality and/or hyponatremia   . Hyperpotassemia   . Edema   . Closed fracture of lower end of radius with ulna   . Hypercalcemia   . Deafness     Left, s/p multiple surgeries  . Abnormal stress test     a. 11/2011 Ex MV: EF 80%, small, partially reversible anteroapical defect->mild ischemia vs attenuation-->med Rx.   Past Surgical History  Procedure Laterality  Date  . Breast biopsy  06/18/1998    left  . Total abdominal hysterectomy    . Tonsillectomy    . Breast biopsy     Social History:   reports that she quit smoking about 30 years ago. Her smoking use included Cigarettes. She has never used smokeless tobacco. She reports that she drinks about 0.6 oz of alcohol per week. She reports that she does not use illicit drugs.  Family History  Problem Relation Age of Onset  . Hypertension Father   . Heart disease Father     patient does not know details.   . Breast cancer Daughter   . Cancer Daughter     breast  . Ovarian cancer Mother 32  . Cancer Sister     colon  . Heart disease Sister   . Cancer Sister   . Cancer Sister     hodgkin disease    Medications: Patient's Medications  New Prescriptions   No medications on file  Previous Medications   AMIODARONE (PACERONE) 200 MG TABLET    Take 1 tablet (200 mg total) by mouth daily.   BACLOFEN (LIORESAL) 10 MG TABLET    Take 0.5 tablets (5 mg total) by mouth at bedtime.   BIOTIN 5000 MCG TABS    Take 5,000 mcg by mouth daily.   CHOLECALCIFEROL (VITAMIN D3) 400 UNITS CAPS    Take 1 capsule by mouth daily.   FENTANYL (DURAGESIC - DOSED MCG/HR) 12 MCG/HR    Place 12.5 mcg onto the skin every 3 (three) days.   FUROSEMIDE (LASIX) 40 MG TABLET    Take 2 tablets (80 mg total) by mouth 2 (two) times daily.   GABAPENTIN (NEURONTIN) 100 MG CAPSULE    Take 1 capsule (100 mg total) by mouth at bedtime.   GLUCOSAMINE-CHONDROITIN DS PO    Take 1 tablet by mouth daily.    HYDRALAZINE (APRESOLINE) 50 MG TABLET    Take 1.5 tablets (75 mg total) by mouth 3 (three) times daily.   HYDROXYUREA (HYDREA) 500 MG CAPSULE    TAKE 2 CAPSULES ON MONDAYS AND TAKE 1 CAPSULE ALL OTHER DAYS OF THE WEEK.   IBANDRONATE (BONIVA) 150 MG TABLET    TAKE ONE TABLET ONCE A MONTH IN THE MORNING ON AN EMPTY STOMACH WITH WATER. REMAIN UPRIGHT.   LEVOTHYROXINE (SYNTHROID, LEVOTHROID) 112 MCG TABLET    Take 112 mcg by mouth daily  before breakfast.   MAGNESIUM 250 MG TABS    Take 1 tablet by mouth daily.   MELATONIN 5 MG CAPS    Take 5 mg by mouth at bedtime.   MULTIPLE VITAMINS-MINERALS (PRESERVISION AREDS PO)    Take 1 capsule by mouth daily.   POLYETHYLENE GLYCOL (MIRALAX / GLYCOLAX) PACKET    Take 17 g by mouth daily.   POTASSIUM CHLORIDE SA (K-DUR,KLOR-CON) 20 MEQ TABLET    Take 1 tablet (20 mEq total) by mouth daily.   PREDNISONE (DELTASONE) 10 MG TABLET  Take 10 mg by mouth daily with breakfast.   SENNA-DOCUSATE (SENOKOT-S) 8.6-50 MG PER TABLET    Take 2 tablets by mouth 2 (two) times daily.    TRAMADOL (ULTRAM) 50 MG TABLET    Take 1 tablet (50 mg total) by mouth every 6 (six) hours as needed.   XARELTO 15 MG TABS TABLET    TAKE 1 TABLET ONCE DAILY.  Modified Medications   No medications on file  Discontinued Medications   No medications on file     Physical Exam: There were no vitals filed for this visit.  Physical Exam  Constitutional: She is oriented to person, place, and time. No distress.  HENT:  Head: Normocephalic and atraumatic.  Neck: No JVD present.  Right neck lipoma  Cardiovascular:  Irregular rhythm.  Trace edema  Pulmonary/Chest: Effort normal and breath sounds normal. No respiratory distress.  Abdominal: Soft. Bowel sounds are normal. She exhibits no distension. There is no tenderness.  Musculoskeletal: Tenderness: lumbar spine with decreased ROM.  Stable gait with walker. Decrease ROM to the spine due to pain. No tenderness  Neurological: She is alert and oriented to person, place, and time. No cranial nerve deficit.  Skin: Skin is warm and dry. She is not diaphoretic.  Psychiatric: She has a normal mood and affect.    Labs reviewed: Basic Metabolic Panel:  Recent Labs  12/26/13 1649 04/15/14 1530 07/19/14 1249  NA 138 134* 136  K 4.0 3.9 4.1  CL 102 95* 96  CO2 29 35* 32  GLUCOSE 97 92 87  BUN 21 18 22   CREATININE 1.2 1.03 0.85  CALCIUM 10.7* 10.3 10.7*   Liver  Function Tests:  Recent Labs  11/19/13 1445 11/28/13 1626 04/15/14 1530  AST 16 26 13   ALT 13 28 15   ALKPHOS 59 77 103  BILITOT 0.9 0.8 0.4  PROT 7.2 6.9 6.9  ALBUMIN 4.1 4.0 4.1   No results for input(s): LIPASE, AMYLASE in the last 8760 hours. No results for input(s): AMMONIA in the last 8760 hours. CBC:  Recent Labs  10/11/13 1308  12/05/13 1157 04/15/14 1530 05/09/14 1302  WBC 7.8  < > 6.8 9.7 8.1  NEUTROABS 5.7  --   --  7.8* 6.1  HGB 12.2  < > 13.8 12.4 12.5  HCT 37.5  < > 41.5 36.9 38.4  MCV 115.8*  < > 112.9 Repeated and verified X2.* 109.8* 111.9*  PLT 260  < > 317.0 508.0* 349  < > = values in this interval not displayed. TSH:  Recent Labs  11/19/13 1445 05/15/14 1540  TSH 0.87 1.10   A1C: No results found for: HGBA1C Lipid Panel: No results for input(s): CHOL, HDL, LDLCALC, TRIG, CHOLHDL, LDLDIRECT in the last 8760 hours.  Radiological Exams:  Reviewed MRI 07/22/2014  from 04/15/14 1. Advanced degenerative lumbar spondylosis with multilevel disc disease and facet disease. 2. Left foraminal disc protrusion at L2-3 with mild left foraminal stenosis. 3. Shallow broad-based foraminal and extra foraminal disc protrusion on the left at L3-4 likely effecting both the left L3 and L4 nerve roots. 4. Mild bilateral lateral recess stenosis at L4-5. There is also a right foraminal and extra foraminal disc protrusion possibly affecting the left L4 nerve root.  Assessment/Plan  1. Midline low back pain without sciatica Continue Prednisone at 10 mg daily due to pain. Consider taper in f/u with clinic. Continue Fentanyl patch at 12 mcg but would consider increasing at f/u visit if still using ultram prn.  Continue scheduled Tylenol as well.   2. Long term current use of systemic steroids Edema and BP have improved  3. Polycythemia vera Followed by Dr. Genoveva Ill, currently on hydroxyurea  4. Osteoporosis Currently on Boniva. F/u out pt  5. Hypothyroidism  due to acquired atrophy of thyroid Stable, continue synthroid  6. Essential hypertension Improved. Continue current meds  7. Chronic diastolic CHF (congestive heart failure) Edema improved with compression hose. Followed by Dr. Aundra Dubin. Continues on Lasix 60 mg BID.  8. Paroxysmal atrial fibrillation Rate controlled. On xarelto without complication for CVA risk reduction.    She is ready for discharge to return to independent living with PT and OT. She will need to f/u with Dr. Mariea Clonts in 7-10 days for further titration of fentanyl and taper of prednisone if tolerated.    Cindi Carbon, ANP [facility] [phone]

## 2014-07-23 ENCOUNTER — Telehealth: Payer: Self-pay | Admitting: Physician Assistant

## 2014-07-23 DIAGNOSIS — R600 Localized edema: Secondary | ICD-10-CM | POA: Diagnosis not present

## 2014-07-23 DIAGNOSIS — M545 Low back pain: Secondary | ICD-10-CM | POA: Diagnosis not present

## 2014-07-23 DIAGNOSIS — R2681 Unsteadiness on feet: Secondary | ICD-10-CM | POA: Diagnosis not present

## 2014-07-23 DIAGNOSIS — M6281 Muscle weakness (generalized): Secondary | ICD-10-CM | POA: Diagnosis not present

## 2014-07-23 DIAGNOSIS — Z5189 Encounter for other specified aftercare: Secondary | ICD-10-CM | POA: Diagnosis not present

## 2014-07-23 DIAGNOSIS — R262 Difficulty in walking, not elsewhere classified: Secondary | ICD-10-CM | POA: Diagnosis not present

## 2014-07-23 NOTE — Telephone Encounter (Signed)
Pt notified of lab results and that I had s/e her daughter Manuela Schwartz last week about results and repeat lab work 6/17. pt said thank you. Pt asked did she need to come in at 9:45 for lab or could she come later in the day, I said later is fine. pt said ok

## 2014-07-23 NOTE — Telephone Encounter (Signed)
Pt returned call from 07/19/2014. Please call

## 2014-07-24 DIAGNOSIS — M545 Low back pain: Secondary | ICD-10-CM | POA: Diagnosis not present

## 2014-07-24 DIAGNOSIS — R262 Difficulty in walking, not elsewhere classified: Secondary | ICD-10-CM | POA: Diagnosis not present

## 2014-07-24 DIAGNOSIS — R600 Localized edema: Secondary | ICD-10-CM | POA: Diagnosis not present

## 2014-07-24 DIAGNOSIS — Z5189 Encounter for other specified aftercare: Secondary | ICD-10-CM | POA: Diagnosis not present

## 2014-07-24 DIAGNOSIS — R2681 Unsteadiness on feet: Secondary | ICD-10-CM | POA: Diagnosis not present

## 2014-07-24 DIAGNOSIS — M6281 Muscle weakness (generalized): Secondary | ICD-10-CM | POA: Diagnosis not present

## 2014-07-25 ENCOUNTER — Telehealth: Payer: Self-pay | Admitting: *Deleted

## 2014-07-25 ENCOUNTER — Telehealth: Payer: Self-pay | Admitting: Cardiology

## 2014-07-25 MED ORDER — FENTANYL 12 MCG/HR TD PT72: MEDICATED_PATCH | TRANSDERMAL | Status: DC

## 2014-07-25 NOTE — Telephone Encounter (Signed)
Patient called and left message that her pain patch has fallen off twice and going to need new Rx.  I tried calling patient back to get more detail because she has been discharged from [facility] on 07/22/14. LMOM to return call.

## 2014-07-25 NOTE — Telephone Encounter (Signed)
New Message       Pt calling stating she would like to get her labs done at another office and wants to know if that is ok. [facility] in [place] [phone]. Please call pt back and advise.

## 2014-07-25 NOTE — Telephone Encounter (Signed)
I called [facility] and they stated that patient would indeed have to pick up Rx and bring to pharmacy. I called patient to inform her and she stated that she spoke with someone at [facility] and they are going to increase her pain patch and write her a Rx for it. So discarded today's Rx that was printed.

## 2014-07-25 NOTE — Telephone Encounter (Signed)
Spoke with pt.  She is aware I will fax the order as requested to [facility].

## 2014-07-25 NOTE — Telephone Encounter (Signed)
Spoke with Dr. Mariea Clonts regarding patients pain patch.  Dr. Mariea Clonts to call in another prescription.  Informed Mrs. Veronica  the medications will be called, she requested they be called into  [facility]...fyi to Triage CMA, Rodena Piety..Carolin Coy

## 2014-07-26 ENCOUNTER — Other Ambulatory Visit (INDEPENDENT_AMBULATORY_CARE_PROVIDER_SITE_OTHER): Payer: Medicare Other | Admitting: *Deleted

## 2014-07-26 DIAGNOSIS — N179 Acute kidney failure, unspecified: Secondary | ICD-10-CM

## 2014-07-26 DIAGNOSIS — I5032 Chronic diastolic (congestive) heart failure: Secondary | ICD-10-CM | POA: Diagnosis not present

## 2014-07-26 DIAGNOSIS — M47816 Spondylosis without myelopathy or radiculopathy, lumbar region: Secondary | ICD-10-CM | POA: Diagnosis not present

## 2014-07-26 LAB — BASIC METABOLIC PANEL
BUN: 20 mg/dL (ref 6–23)
CHLORIDE: 93 meq/L — AB (ref 96–112)
CO2: 34 meq/L — AB (ref 19–32)
Calcium: 11 mg/dL — ABNORMAL HIGH (ref 8.4–10.5)
Creatinine, Ser: 0.88 mg/dL (ref 0.40–1.20)
GFR: 65.39 mL/min (ref >60.00)
Glucose, Bld: 97 mg/dL (ref 70–99)
Potassium: 4 mEq/L (ref 3.5–5.1)
Sodium: 133 mEq/L — ABNORMAL LOW (ref 135–145)

## 2014-07-26 LAB — BRAIN NATRIURETIC PEPTIDE: Pro B Natriuretic peptide (BNP): 289 pg/mL — ABNORMAL HIGH (ref 0.0–100.0)

## 2014-07-29 DIAGNOSIS — Z5189 Encounter for other specified aftercare: Secondary | ICD-10-CM | POA: Diagnosis not present

## 2014-07-29 DIAGNOSIS — M545 Low back pain: Secondary | ICD-10-CM | POA: Diagnosis not present

## 2014-07-29 DIAGNOSIS — R2681 Unsteadiness on feet: Secondary | ICD-10-CM | POA: Diagnosis not present

## 2014-07-29 DIAGNOSIS — R262 Difficulty in walking, not elsewhere classified: Secondary | ICD-10-CM | POA: Diagnosis not present

## 2014-07-29 DIAGNOSIS — R600 Localized edema: Secondary | ICD-10-CM | POA: Diagnosis not present

## 2014-07-29 DIAGNOSIS — M6281 Muscle weakness (generalized): Secondary | ICD-10-CM | POA: Diagnosis not present

## 2014-07-30 ENCOUNTER — Telehealth: Payer: Self-pay | Admitting: Physician Assistant

## 2014-07-30 DIAGNOSIS — N179 Acute kidney failure, unspecified: Secondary | ICD-10-CM

## 2014-07-30 NOTE — Telephone Encounter (Signed)
Pt notified of lab results and repeat bmet 7/1. Pt aware to f/u w/PCP due to calcium level elevated. Pt agreeable to plan of care.

## 2014-07-30 NOTE — Telephone Encounter (Signed)
New message  ° ° °Patient calling for test results.   °

## 2014-07-31 ENCOUNTER — Telehealth: Payer: Self-pay | Admitting: Cardiology

## 2014-07-31 DIAGNOSIS — M6281 Muscle weakness (generalized): Secondary | ICD-10-CM | POA: Diagnosis not present

## 2014-07-31 DIAGNOSIS — R262 Difficulty in walking, not elsewhere classified: Secondary | ICD-10-CM | POA: Diagnosis not present

## 2014-07-31 DIAGNOSIS — M545 Low back pain: Secondary | ICD-10-CM | POA: Diagnosis not present

## 2014-07-31 DIAGNOSIS — R2681 Unsteadiness on feet: Secondary | ICD-10-CM | POA: Diagnosis not present

## 2014-07-31 DIAGNOSIS — Z5189 Encounter for other specified aftercare: Secondary | ICD-10-CM | POA: Diagnosis not present

## 2014-07-31 DIAGNOSIS — R600 Localized edema: Secondary | ICD-10-CM | POA: Diagnosis not present

## 2014-07-31 NOTE — Telephone Encounter (Signed)
Pt c/o medication issue:  1. Name of Medication: "Fluid Pill"  2. How are you currently taking this medication (dosage and times per day)? 2x per day  3. Are you having a reaction (difficulty breathing--STAT)? No  4. What is your medication issue? Pt calling wanting to know if she needs to stay at current dosage or increase it. Please call back and advise.

## 2014-07-31 NOTE — Telephone Encounter (Signed)
[place], [place]   07/31/2014 2:30 PM

## 2014-07-31 NOTE — Telephone Encounter (Signed)
Verified with patient that her Lasix dose is as follows:   Lasix 40 mg - Take (2) two tablets (80 mg total) twice daily.  Patient verbalized understanding. She denies any dizziness or lightheadedness. Patient states she will call office if she develops dizziness or feels she is getting too "dried out". Patient aware of repeat lab work appt for next week. Denies any further questions or concerns.

## 2014-08-05 DIAGNOSIS — R2681 Unsteadiness on feet: Secondary | ICD-10-CM | POA: Diagnosis not present

## 2014-08-05 DIAGNOSIS — M545 Low back pain: Secondary | ICD-10-CM | POA: Diagnosis not present

## 2014-08-05 DIAGNOSIS — R262 Difficulty in walking, not elsewhere classified: Secondary | ICD-10-CM | POA: Diagnosis not present

## 2014-08-05 DIAGNOSIS — Z5189 Encounter for other specified aftercare: Secondary | ICD-10-CM | POA: Diagnosis not present

## 2014-08-05 DIAGNOSIS — M6281 Muscle weakness (generalized): Secondary | ICD-10-CM | POA: Diagnosis not present

## 2014-08-05 DIAGNOSIS — R600 Localized edema: Secondary | ICD-10-CM | POA: Diagnosis not present

## 2014-08-06 DIAGNOSIS — M6281 Muscle weakness (generalized): Secondary | ICD-10-CM | POA: Diagnosis not present

## 2014-08-06 DIAGNOSIS — R2681 Unsteadiness on feet: Secondary | ICD-10-CM | POA: Diagnosis not present

## 2014-08-06 DIAGNOSIS — R600 Localized edema: Secondary | ICD-10-CM | POA: Diagnosis not present

## 2014-08-06 DIAGNOSIS — M545 Low back pain: Secondary | ICD-10-CM | POA: Diagnosis not present

## 2014-08-06 DIAGNOSIS — Z5189 Encounter for other specified aftercare: Secondary | ICD-10-CM | POA: Diagnosis not present

## 2014-08-06 DIAGNOSIS — R262 Difficulty in walking, not elsewhere classified: Secondary | ICD-10-CM | POA: Diagnosis not present

## 2014-08-07 ENCOUNTER — Non-Acute Institutional Stay: Payer: Medicare Other | Admitting: Internal Medicine

## 2014-08-07 VITALS — BP 136/62 | HR 68 | Temp 98.1°F | Ht 60.0 in | Wt 118.0 lb

## 2014-08-07 DIAGNOSIS — M5440 Lumbago with sciatica, unspecified side: Secondary | ICD-10-CM

## 2014-08-07 DIAGNOSIS — D45 Polycythemia vera: Secondary | ICD-10-CM | POA: Diagnosis not present

## 2014-08-07 DIAGNOSIS — E034 Atrophy of thyroid (acquired): Secondary | ICD-10-CM

## 2014-08-07 DIAGNOSIS — I5032 Chronic diastolic (congestive) heart failure: Secondary | ICD-10-CM

## 2014-08-07 DIAGNOSIS — E038 Other specified hypothyroidism: Secondary | ICD-10-CM

## 2014-08-07 DIAGNOSIS — M810 Age-related osteoporosis without current pathological fracture: Secondary | ICD-10-CM

## 2014-08-07 DIAGNOSIS — K5901 Slow transit constipation: Secondary | ICD-10-CM | POA: Diagnosis not present

## 2014-08-07 DIAGNOSIS — Z7952 Long term (current) use of systemic steroids: Secondary | ICD-10-CM

## 2014-08-07 DIAGNOSIS — I2510 Atherosclerotic heart disease of native coronary artery without angina pectoris: Secondary | ICD-10-CM | POA: Diagnosis not present

## 2014-08-07 DIAGNOSIS — I480 Paroxysmal atrial fibrillation: Secondary | ICD-10-CM | POA: Diagnosis not present

## 2014-08-07 MED ORDER — SENNOSIDES-DOCUSATE SODIUM 8.6-50 MG PO TABS: 2.0000 | ORAL_TABLET | Freq: Two times a day (BID) | ORAL | Status: DC

## 2014-08-07 NOTE — Progress Notes (Signed)
Patient ID: Sarah Mays, female   DOB: 09/19/1930, 81 y.o.   MRN: [id number]   Location:  [facility]  Code Status: full code Goals of Care:Advanced Directive information Type of Advance Directive: Healthcare Power of Attorney;Living will  Chief Complaint  Patient presents with  . Medical Management of Chronic Issues    follow-up from Rehab.   . Numbness    in little finger left hand, a little in ring finger   . swelling    in stomach and feet, is the Prednisone causing this.     HPI: Patient is a 81 y.o. white female seen in the [facility] today for f/u from rehab where I met her.  Unfortunately, she was not scheduled in a new patient slot.  She previously saw Dr. Hunter just a couple of times [place]  We have his records and some of her old records have been scanned, as well.    Back pain about the same.  She is planning to have ablation at end of July.  Says she is having too much edema of her abdomen.  Feet have gone down with increased dosage of lasix.  Has f/u labs Friday for that.  They were so swollen she thought they'd burst.   Wants to decrease and get off of prednisone.  Has been on it for a few months now.   Using fentanyl patch 12mcg and last two days has only needed tramadol 2x per day.   Using two senna s twice day.    Left pinky and a little on tip of 4th finger are numb for a couple of months.  No noted things worsening it or making it better.  No neck pain.  No arm pain.  No burning or tingling.   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   Not typing or using computer either.    Hypercalcemia very minor on bmp noted by cardiology on 6/17--has repeat coming up.  She took her boniva around 6/8 and takes only vitamin D supplement.  She's had thyroid disease, but no h/o parathyroid problems.  She does not take calcium.  Review of Systems:  Review of Systems  Constitutional: Negative for fever and  chills.  HENT: Negative for hearing loss.   Eyes: Negative for blurred vision.  Respiratory: Negative for shortness of breath.   Cardiovascular: Positive for leg swelling. Negative for chest pain and palpitations.  Gastrointestinal: Positive for constipation. Negative for abdominal pain, diarrhea, blood in stool and melena.       Abdominal fluid retention  Genitourinary: Negative for dysuria, urgency and frequency.  Musculoskeletal: Positive for back pain. Negative for myalgias, joint pain, falls and neck pain.  Neurological: Negative for dizziness and weakness.  Psychiatric/Behavioral: Negative for memory loss.    Past Medical History  Diagnosis Date  . PAF (paroxysmal atrial fibrillation)     a. on amio (02/2012 mild obstruction on PFT's) and xarelto.  . Essential hypertension   . Mitral valve disorders   . Tricuspid valve disorders, specified as nonrheumatic   . Neck mass     right, w/u including MRI negative  . Hypothyroidism   . Macular degeneration     Left, s/p inj. tx  . Senile osteoporosis     Reclast in the past  . Alopecia, unspecified   . Pure hypercholesterolemia   . Chronic diastolic CHF (congestive heart failure)     a. 11/2013   Echo: EF 60-65%, no rwma, mild TR, PASP 27mmHg.  . Headache(784.0)   . Backache, unspecified   . Polycythemia vera(238.4)   . Gouty arthropathy, unspecified   . Insomnia, unspecified   . Other malaise and fatigue   . Hyposmolality and/or hyponatremia   . Hyperpotassemia   . Edema   . Closed fracture of lower end of radius with ulna   . Hypercalcemia   . Deafness     Left, s/p multiple surgeries  . Abnormal stress test     a. 11/2011 Ex MV: EF 80%, small, partially reversible anteroapical defect->mild ischemia vs attenuation-->med Rx.    Past Surgical History  Procedure Laterality Date  . Breast biopsy  06/18/1998    left  . Total abdominal hysterectomy    . Tonsillectomy    . Breast biopsy    . Tympanoplasty Bilateral      Social History:   reports that she quit smoking about 30 years ago. Her smoking use included Cigarettes. She has never used smokeless tobacco. She reports that she drinks about 0.6 oz of alcohol per week. She reports that she does not use illicit drugs.  Allergies  Allergen Reactions  . Amlodipine     Lower extremity edema     Medications: Patient's Medications  New Prescriptions   No medications on file  Previous Medications   AMIODARONE (PACERONE) 200 MG TABLET    Take 1 tablet (200 mg total) by mouth daily.   BACLOFEN (LIORESAL) 10 MG TABLET    Take 0.5 tablets (5 mg total) by mouth at bedtime.   BIOTIN 5000 MCG TABS    Take 5,000 mcg by mouth daily.   CHOLECALCIFEROL (VITAMIN D3) 400 UNITS CAPS    Take 1 capsule by mouth daily.   FENTANYL (DURAGESIC - DOSED MCG/HR) 12 MCG/HR    Apply one patch every 3 days for pain. Remove old patch and rotate sites.   FUROSEMIDE (LASIX) 40 MG TABLET    Take 2 tablets (80 mg total) by mouth 2 (two) times daily.   GABAPENTIN (NEURONTIN) 100 MG CAPSULE    Take 1 capsule (100 mg total) by mouth at bedtime.   GLUCOSAMINE-CHONDROITIN DS PO    Take 1 tablet by mouth daily.    HYDRALAZINE (APRESOLINE) 50 MG TABLET    Take 1.5 tablets (75 mg total) by mouth 3 (three) times daily.   HYDROXYUREA (HYDREA) 500 MG CAPSULE    TAKE 2 CAPSULES ON MONDAYS AND TAKE 1 CAPSULE ALL OTHER DAYS OF THE WEEK.   IBANDRONATE (BONIVA) 150 MG TABLET    TAKE ONE TABLET ONCE A MONTH IN THE MORNING ON AN EMPTY STOMACH WITH WATER. REMAIN UPRIGHT.   LEVOTHYROXINE (SYNTHROID, LEVOTHROID) 112 MCG TABLET    Take 112 mcg by mouth daily before breakfast.   MAGNESIUM 250 MG TABS    Take 1 tablet by mouth daily.   MELATONIN 5 MG CAPS    Take 5 mg by mouth at bedtime.   MULTIPLE VITAMINS-MINERALS (PRESERVISION AREDS PO)    Take 1 capsule by mouth daily.   POLYETHYLENE GLYCOL (MIRALAX / GLYCOLAX) PACKET    Take 17 g by mouth daily.   POTASSIUM CHLORIDE SA (K-DUR,KLOR-CON) 20 MEQ  TABLET    Take 1 tablet (20 mEq total) by mouth daily.   PREDNISONE (DELTASONE) 10 MG TABLET    Take 10 mg by mouth daily with breakfast.   SENNA-DOCUSATE (SENOKOT-S) 8.6-50 MG PER TABLET    Take 2 tablets by mouth 2 (  two) times daily.    TRAMADOL (ULTRAM) 50 MG TABLET    Take 1 tablet (50 mg total) by mouth every 6 (six) hours as needed.   XARELTO 15 MG TABS TABLET    TAKE 1 TABLET ONCE DAILY.  Modified Medications   No medications on file  Discontinued Medications   No medications on file     Physical Exam: Filed Vitals:   08/07/14 1124  BP: 136/62  Pulse: 68  Temp: 98.1 F (36.7 C)  TempSrc: Oral  Height: 5' (1.524 m)  Weight: 118 lb (53.524 kg)   Body mass index is 23.05 kg/(m^2). Physical Exam  Constitutional: She is oriented to person, place, and time. She appears well-developed and well-nourished. No distress.  Cardiovascular:  irreg irreg  Pulmonary/Chest: Effort normal and breath sounds normal. No respiratory distress.  Abdominal: Soft. Bowel sounds are normal. She exhibits no distension and no mass. There is no tenderness.  Musculoskeletal: Normal range of motion. She exhibits tenderness.  Of lumbar paravertebral muscles; wearing compression brace (velcro); walks with rolling walker  Neurological: She is alert and oriented to person, place, and time.  Skin: Skin is warm and dry.  Psychiatric: She has a normal mood and affect. Her behavior is normal. Judgment and thought content normal.     Labs reviewed: Basic Metabolic Panel:  Recent Labs  11/19/13 1445  04/15/14 1530 05/15/14 1540 07/19/14 1249 07/26/14 1342  NA 133*  < > 134*  --  136 133*  K 3.6  < > 3.9  --  4.1 4.0  CL 98  < > 95*  --  96 93*  CO2 23  < > 35*  --  32 34*  GLUCOSE 89  < > 92  --  87 97  BUN 26*  < > 18  --  22 20  CREATININE 1.1  < > 1.03  --  0.85 0.88  CALCIUM 10.4  < > 10.3  --  10.7* 11.0*  TSH 0.87  --   --  1.10  --   --   < > = values in this interval not  displayed. Liver Function Tests:  Recent Labs  11/19/13 1445 11/28/13 1626 04/15/14 1530  AST _0 ALT _1 ALKPHOS 59 77 103  BILITOT 0.9 0.8 0.4  PROT 7.2 6.9 6.9  ALBUMIN 4.1 4.0 4.1   No results for input(s): LIPASE, AMYLASE in the last 8760 hours. No results for input(s): AMMONIA in the last 8760 hours. CBC:  Recent Labs  10/11/13 1308  12/05/13 1157 04/15/14 1530 05/09/14 1302  WBC 7.8  < > 6.8 9.7 8.1  NEUTROABS 5.7  --   --  7.8* 6.1  HGB 12.2  < > 13.8 12.4 12.5  HCT 37.5  < > 41.5 36.9 38.4  MCV 115.8*  < > 112.9 Repeated and verified X2.* 109.8* 111.9*  PLT 260  < > 317.0 508.0* 349  < > = values in this interval not displayed. Lipid Panel: No results for input(s): CHOL, HDL, LDLCALC, TRIG, CHOLHDL, LDLDIRECT in the last 8760 hours. No results found for: HGBA1C Labs reviewed from cardiology appt  Patient Care Team: Gayland Curry, DO as PCP - General (Geriatric Medicine) [facility] Hurman Horn, MD as Consulting Physician (Ophthalmology) Larey Dresser, MD as Consulting Physician (Cardiology) Heath Lark, MD as Consulting Physician (Hematology and Oncology) Gayland Curry, DO as Attending Physician (Geriatric Medicine) Heath Lark, MD as Consulting  Physician (Hematology and Oncology) Robert Groat, MD as Consulting Physician (Ophthalmology) David Shoemaker, MD as Consulting Physician (Otolaryngology)  Assessment/Plan 1. Slow transit constipation - due to opioid use and decreased activity due to pain -will cont senna s 2 tabs twice daily while on fentanyl and tramadol for breakthrough - senna-docusate (SENOKOT-S) 8.6-50 MG per tablet; Take 2 tablets by mouth 2 (two) times daily.  Dispense: 120 tablet; Refill: 3  Low back pain:   Cont prednisone--tapering to off--down to 5mg as of tomorrow and cont this until all 20 pills are used up Is for ablation coming up and we'll see how she's doing by the time she comes to see  me  2. Long term current use of systemic steroids -decrease prednisone to 5mg daily for 20 more days, then discontinue due to her side effects  3. Polycythemia vera -follows with Dr. Gorsuch and h/h has been stable   4. Osteoporosis -cont vitamin D supplement daily and boniva monthly -last bone density was 07/02/08  5. Chronic diastolic CHF (congestive heart failure) -followed by cardiology, Dr. McLean who has recently increased her lasix with improvement in the edema of her legs and feet (still present in abdomen)  6. Paroxysmal atrial fibrillation -cont xarelto therapy for anticoagulation, amiodarone (but remains in afib)  7. Hypothyroidism due to acquired atrophy of thyroid -cont synthroid 112mcg daily  8. Hypercalcemia -has f/u labs with cardiology--will see how Ca turns out b/c it has been trending upward -if abnormal again, will need PTH, phos  Labs/tests ordered:  Cbc, bmp, flp, tsh before Next appt:  3 mos  Tiffany L. Reed, D.O. Geriatrics [facility] [address] [place], [place] [address] Cell Phone (Mon-Fri 8am-5pm):  [phone] On Call:  [phone] & follow prompts after 5pm & weekends Office Phone:  [phone] Office Fax:  [phone]    

## 2014-08-08 DIAGNOSIS — R600 Localized edema: Secondary | ICD-10-CM | POA: Diagnosis not present

## 2014-08-08 DIAGNOSIS — M545 Low back pain: Secondary | ICD-10-CM | POA: Diagnosis not present

## 2014-08-08 DIAGNOSIS — M6281 Muscle weakness (generalized): Secondary | ICD-10-CM | POA: Diagnosis not present

## 2014-08-08 DIAGNOSIS — R2681 Unsteadiness on feet: Secondary | ICD-10-CM | POA: Diagnosis not present

## 2014-08-08 DIAGNOSIS — R262 Difficulty in walking, not elsewhere classified: Secondary | ICD-10-CM | POA: Diagnosis not present

## 2014-08-08 DIAGNOSIS — Z5189 Encounter for other specified aftercare: Secondary | ICD-10-CM | POA: Diagnosis not present

## 2014-08-09 ENCOUNTER — Other Ambulatory Visit (INDEPENDENT_AMBULATORY_CARE_PROVIDER_SITE_OTHER): Payer: Medicare Other | Admitting: *Deleted

## 2014-08-09 ENCOUNTER — Telehealth: Payer: Self-pay | Admitting: *Deleted

## 2014-08-09 DIAGNOSIS — N179 Acute kidney failure, unspecified: Secondary | ICD-10-CM

## 2014-08-09 LAB — BASIC METABOLIC PANEL
BUN: 20 mg/dL (ref 6–23)
CO2: 34 mEq/L — ABNORMAL HIGH (ref 19–32)
CREATININE: 0.94 mg/dL (ref 0.40–1.20)
Calcium: 10.8 mg/dL — ABNORMAL HIGH (ref 8.4–10.5)
Chloride: 93 mEq/L — ABNORMAL LOW (ref 96–112)
GFR: 60.59 mL/min (ref >60.00)
Glucose, Bld: 65 mg/dL — ABNORMAL LOW (ref 70–99)
Potassium: 4 mEq/L (ref 3.5–5.1)
Sodium: 134 mEq/L — ABNORMAL LOW (ref 135–145)

## 2014-08-09 NOTE — Telephone Encounter (Signed)
Pt notified of lab results with verbal understanding by phone. 

## 2014-08-14 ENCOUNTER — Telehealth: Payer: Self-pay | Admitting: Cardiology

## 2014-08-14 ENCOUNTER — Telehealth: Payer: Self-pay

## 2014-08-14 NOTE — Telephone Encounter (Signed)
Follow Up      Calling back to f/u on previous message, states she will only be available before 5 pm. Please call back and advise.

## 2014-08-14 NOTE — Telephone Encounter (Signed)
Received message from [facility] clinic nurse: patient having nausea, dizziness, BP 150/74, Pulse 95. Called patient, she had some nausea 2 days ago, it cleared up, started again today. Eating crackers, drinking diet Coke, only water she is drinking is when she taking her pills. When she close her eyes the room goes around. She just put new Fentanyl patch today, has 2 left in box, won't need new Rx yet. Per Dr. Mariea Clonts increase fluids, will put in order for PT for vertigo. Patient was fine with this.

## 2014-08-14 NOTE — Telephone Encounter (Signed)
New message       Pt c/o dizziness and is nausea. She also has body weakness.  Nurse visited the pt and HR 95, bp 150/74.  Pt is on a pain medication for chronic back pain.  These symptoms are new.  Please advise.

## 2014-08-14 NOTE — Telephone Encounter (Signed)
Patient scheduled to see Dr Aundra Dubin since he had an available appointment tomorrow  Patient very appreciative she was getting to see him

## 2014-08-14 NOTE — Telephone Encounter (Signed)
She needs to come get an ECG to make sure she is not back in atrial fibrillation.

## 2014-08-14 NOTE — Telephone Encounter (Signed)
Spoke with [name] and appt made for pt to come in for EKG tomorrow at 10:00

## 2014-08-14 NOTE — Telephone Encounter (Signed)
Spoke with [name] (nurse at [facility]). She saw pt today and pt complained of nausea for past 3 days. Pt on fentanyl and tramadol. Dizziness which started today.  Generalized body weakness.  Is able to ambulate and is steady when ambulating.  Poor appetite due to nausea.  No chest pain or shortness of breath. Did take Lasix today. Heart rate is irregular. Pt on Xarelto.   I asked [name] to contact primary care for pt's symptoms.  Will also forward to Dr. Aundra Dubin for any additional recommendations.

## 2014-08-15 ENCOUNTER — Encounter: Payer: Self-pay | Admitting: Cardiology

## 2014-08-15 ENCOUNTER — Ambulatory Visit (INDEPENDENT_AMBULATORY_CARE_PROVIDER_SITE_OTHER): Payer: Medicare Other | Admitting: Cardiology

## 2014-08-15 VITALS — BP 120/70 | HR 75 | Ht 60.0 in | Wt 117.0 lb

## 2014-08-15 DIAGNOSIS — I2510 Atherosclerotic heart disease of native coronary artery without angina pectoris: Secondary | ICD-10-CM | POA: Diagnosis not present

## 2014-08-15 DIAGNOSIS — I5032 Chronic diastolic (congestive) heart failure: Secondary | ICD-10-CM | POA: Diagnosis not present

## 2014-08-15 DIAGNOSIS — I480 Paroxysmal atrial fibrillation: Secondary | ICD-10-CM

## 2014-08-15 NOTE — Patient Instructions (Addendum)
Medication Instructions:   Your physician recommends that you continue on your current medications as directed. Please refer to the Current Medication list given to you today.    Labwork:  TSH LFT CBC    Testing/Procedures:   Follow-Up:  IN 3 MONTHS WITH DR [facility] PER [facility]    Any Other Special Instructions Will Be Listed Below (If Applicable).

## 2014-08-16 ENCOUNTER — Telehealth: Payer: Self-pay

## 2014-08-16 LAB — CBC
HCT: 38.5 % (ref 36.0–46.0)
Hemoglobin: 12.8 g/dL (ref 12.0–15.0)
MCHC: 33.2 g/dL (ref 30.0–36.0)
MCV: 113.7 fl — ABNORMAL HIGH (ref 78.0–100.0)
Platelets: 469 10*3/uL — ABNORMAL HIGH (ref 150.0–400.0)
RBC: 3.39 Mil/uL — ABNORMAL LOW (ref 3.87–5.11)
RDW: 17.9 % — ABNORMAL HIGH (ref 11.5–15.5)
WBC: 13.8 10*3/uL — AB (ref 4.0–10.5)

## 2014-08-16 LAB — HEPATIC FUNCTION PANEL
ALT: 12 U/L (ref 0–35)
AST: 17 U/L (ref 0–37)
Albumin: 4 g/dL (ref 3.5–5.2)
Alkaline Phosphatase: 62 U/L (ref 39–117)
BILIRUBIN DIRECT: 0.1 mg/dL (ref 0.0–0.3)
BILIRUBIN TOTAL: 0.6 mg/dL (ref 0.2–1.2)
Total Protein: 6.3 g/dL (ref 6.0–8.3)

## 2014-08-16 LAB — TSH: TSH: 1.14 u[IU]/mL (ref 0.35–4.50)

## 2014-08-16 MED ORDER — TRAMADOL HCL 50 MG PO TABS: 50.0000 mg | ORAL_TABLET | Freq: Four times a day (QID) | ORAL | Status: DC | PRN

## 2014-08-16 NOTE — Progress Notes (Signed)
Patient ID: Sarah Mays, female   DOB: [birth year], 81 y.o.   MRN: [id number] PCP: Dr Hollace Kinnier  81 yo with paroxysmal atrial fibrillation and chronic diastolic CHF returns for cardiology evaluation.  She is on amiodarone to try to maintain NSR. She saw Ignacia Bayley in the office not long ago and Lasix was increased to 80 mg bid for about 5 days with volume overload, now back to Lasix 60 mg bid.  Weight down 8 lbs.  She reports no exertional dyspnea.  Uses walker.  No chest pain, no orthopnea or PND.  No syncope.  Echo in 10/15 showed normal EF, 60-65%.  She is tolerating Xarelto without melena or BRBPR.  She has had significant low back pain, MR showed herniated discs.   She called in yesterday because of 3 days of nausea/dizziness and irregular heart rate.  Daughter was concerned she had gone into atrial fibrillation.  Today, she feels back to normal.  No nausea, HR feels regular.  In the office today, she is in NSR.   ECG: NSR, 1st degree AV block, rSR' V1.   Labs (7/13): K 4.7, creatinine 0.56 Labs (8/13): K 5.4, creatinine 0.69 Labs (11/13): TSH 6.45, K 5.4=>3.8, creatinine 1.5=>1.2, BUN 70=>35, HCT 33.2, BNP 866, AST 25, ALT 36 Labs (12/13): K 3.5 => 2.8, creatinine 1.2 => 1.33, BUN 56 Labs (1/14): LDL 85, HDL 86, K 3.9, creatinine 1.0, BNP 447 Labs (4/14): K 4.1, creatinine 0.9, BNP 293, LFTs normal, TSH normal Labs (8/14): K 4.3, creatinine 1.2, LFTs normal, TSH normal Labs (12/14): LFTs normal, TSH normal Labs (3/15): K 4.5, creatinine 1.3, HCT 36.2 Labs (10/15): K 5.1, creatinine 0.9, LFTs normal, TSH normal, pBNP 1893 Labs (3/16): K 3.9, creatinine 1.03, HCT 36.9, LFTs normal Labs (7/16): K 4, creatinine 0.94, calcium 10.8  PMH: 1. Atrial fibrillation: Diagnosed initially in 10/13.  Holter monitor in 11/13 showed atrial fibrillation with average rate 65.  Back in NSR on amiodarone.   2. Chronic diastolic CHF: Echo (24/58) with EF 65-70%, mild MR, moderate biatrial enlargement,  moderate TR, PA systolic pressure 45 mmHg.   Echo (10/15): EF 60-65%.   3. ETT-Sestamibi (11/13): Exercised to stage II, small partially reversible anteroapical perfusion defect suggesting mild ischemia versus attenuation, EF 80%.  4. HTN: Lower extremity swelling with amlodipine.  5. Polycythemia vera: Has had phlebotomy only once.  6. Hypothyroidism 7. Macular degeneration 8. TAH 1980 9. Familial hypocalciuric hypercalcemia 10. PFTs (amiodarone use) in 1/14 showed a mild obstructive defect.  11. Degenerative disc disease  SH: Prior smoker (years ago).  Married, lived in [place] but now at [place]  Occasional ETOH.  Daughter works for [employer].   FH: CAD  ROS: All systems reviewed and negative except as per HPI.   Current Outpatient Prescriptions  Medication Sig Dispense Refill  . acetaminophen (TYLENOL) 325 MG tablet Take 650 mg by mouth. Take 2 three times daily    . amiodarone (PACERONE) 200 MG tablet Take 1 tablet (200 mg total) by mouth daily. 30 tablet 6  . Biotin 5000 MCG TABS Take 5,000 mcg by mouth daily.    . cholecalciferol (VITAMIN D) 1000 UNITS tablet Take 1,000 Units by mouth daily. Take 2 tablets daily    . fentaNYL (DURAGESIC - DOSED MCG/HR) 12 MCG/HR Apply one patch every 3 days for pain. Remove old patch and rotate sites. 10 patch 0  . furosemide (LASIX) 20 MG tablet Take 60 mg by mouth 2 (two) times daily.    [name] Kitchen  gabapentin (NEURONTIN) 100 MG capsule Take 1 capsule (100 mg total) by mouth at bedtime. 30 capsule 3  . GLUCOSAMINE-CHONDROITIN DS PO Take 1 tablet by mouth daily.     . hydrALAZINE (APRESOLINE) 50 MG tablet Take 1.5 tablets (75 mg total) by mouth 3 (three) times daily. 135 tablet 6  . hydroxyurea (HYDREA) 500 MG capsule TAKE 2 CAPSULES ON MONDAYS AND TAKE 1 CAPSULE ALL OTHER DAYS OF THE WEEK. (Patient taking differently: TAKE 2 CAPSULES BY MOUTH ON MONDAYS AND TAKE 1 CAPSULE BY MOUTH ALL OTHER DAYS OF THE WEEK.) 35 capsule 6  . ibandronate (BONIVA) 150  MG tablet TAKE ONE TABLET ONCE A MONTH IN THE MORNING ON AN EMPTY STOMACH WITH WATER. REMAIN UPRIGHT. 1 tablet 6  . levothyroxine (SYNTHROID, LEVOTHROID) 112 MCG tablet Take 112 mcg by mouth daily before breakfast.    . Magnesium 250 MG TABS Take 1 tablet by mouth daily.    . Melatonin 5 MG CAPS Take 5 mg by mouth at bedtime.    . polyethylene glycol (MIRALAX / GLYCOLAX) packet Take 17 g by mouth daily.    . potassium chloride SA (K-DUR,KLOR-CON) 20 MEQ tablet Take 1 tablet (20 mEq total) by mouth daily. 30 tablet 6  . predniSONE (DELTASONE) 5 MG tablet Take 5 mg by mouth daily with breakfast.    . senna-docusate (SENOKOT-S) 8.6-50 MG per tablet Take 2 tablets by mouth 2 (two) times daily. 120 tablet 3  . traMADol (ULTRAM) 50 MG tablet Take 1 tablet (50 mg total) by mouth every 6 (six) hours as needed. 30 tablet 1  . XARELTO 15 MG TABS tablet TAKE 1 TABLET ONCE DAILY. 30 tablet 3   No current facility-administered medications for this visit.    BP 120/70 mmHg  Pulse 75  Ht 5' (1.524 m)  Wt 117 lb (53.071 kg)  BMI 22.85 kg/m2 General: NAD Neck: JVP 7 cm, no thyromegaly or thyroid nodule.  Lungs: CTAB CV: Nondisplaced PMI.  Heart regular S1/S2, no S3/S4, 1/6 early SEM.  No edema.  No carotid bruit.  Abdomen: Soft, nontender, no hepatosplenomegaly, no distention.  Neurologic: Alert and oriented x 3.  Psych: Normal affect. Extremities: No clubbing or cyanosis.   Assessment/Plan:  1. Atrial fibrillation: First noted in 10/13.  Atrial fibrillation has triggered acute on chronic diastolic CHF in the past.  CHADSVASC score is 72 (age, female gender, HTN, CHF).  She is anticoagulated with Xarelto 15 mg daily.  She is off nebivolol due to bradycardia.  She was concerned yesterday that she was in atrial fibrillation with irregular pulse, but is back in NSR today.  She feels better today.  It is certainly possible that she was in atrial fibrillation for a period and has reverted back to sinus.  -  Continue amiodarone to try to maintain NSR.  - Check LFTs and TSH.  Given amiodarone use, she will need yearly eye exams.  Baseline PFTs showed a mild obstructive defect.  - CBC today given Xarelto use.  If WBCs up, would workup for infection given nonspecific symptoms last few days (UA, etc).  2. CAD: Patient has not had any chest pain. She had an ETT-Sestamibi in 11/13 that showed a small partially reversible anteroapical perfusion defect.  This may be a small area of ischemia but soft tissue attenuation is certainly also possible.  This does not sound like a high risk study.  Would continue to manage medically.  3. Chronic diastolic CHF:  NYHA class II symptoms.  Volume looks ok on current Lasix.  -Continue Lasix 60 mg bid.  Recent BMET stable.   4. HTN: BP controlled on current regimen.   5. Bradycardia: HR is higher off nebivolol, will leave off.  Suspect there is a component of tachy-brady syndrome, may need PCM in future.  6. Hypercalcemia: Mild.  Has diagnosis of familial hypocalciuric hypercalcemia.   Loralie Champagne 08/16/2014

## 2014-08-16 NOTE — Telephone Encounter (Signed)
Received phone call from [facility] clinic nurse, patient requesting refill on Tramadol 50mg  one every 6 hours, last refill 06/13/14 to [facility]

## 2014-08-17 DIAGNOSIS — R300 Dysuria: Secondary | ICD-10-CM | POA: Diagnosis not present

## 2014-08-20 ENCOUNTER — Other Ambulatory Visit: Payer: Self-pay | Admitting: Cardiology

## 2014-08-21 ENCOUNTER — Telehealth: Payer: Self-pay | Admitting: Cardiology

## 2014-08-21 NOTE — Telephone Encounter (Signed)
Not if culture was negative.

## 2014-08-21 NOTE — Telephone Encounter (Signed)
Called Sarah Mays back and let her know Dr. Claris Gladden response. She verbalized understanding.

## 2014-08-21 NOTE — Telephone Encounter (Signed)
New message      Want nurse to know she will be faxing a urine test results for UTI.  She want someone to know it is coming

## 2014-08-21 NOTE — Telephone Encounter (Signed)
Manuela Schwartz the nurse from [facility] informed the office that patient had a urine culture rather than a urine analysis. Culture is not showing signs of UTI and results will be faxed to our office, they will be in your box. Nurse wants to know if you still want the patient to have a urine analysis. Will consult Dr. Deatra Robinson.

## 2014-08-22 ENCOUNTER — Telehealth: Payer: Self-pay | Admitting: Cardiology

## 2014-08-22 NOTE — Telephone Encounter (Signed)
New message       Request for surgical clearance:  What type of surgery is being performed? radiofreq ablation 1. When is this surgery scheduled? 08-29-14  2. Are there any medications that need to be held prior to surgery and how long? Stop xarelto prior to procedure  3. Name of physician performing surgery?  Dr Normajean Glasgow  4. What is your office phone and fax number? Fax [phone]

## 2014-08-22 NOTE — Telephone Encounter (Deleted)
error 

## 2014-08-22 NOTE — Telephone Encounter (Signed)
OK to hold Xarelto prior, restart afterwards.

## 2014-08-22 NOTE — Telephone Encounter (Signed)
Copy to HIM to fax to Dr Mina Marble.

## 2014-08-29 ENCOUNTER — Other Ambulatory Visit: Payer: Self-pay | Admitting: Internal Medicine

## 2014-08-29 DIAGNOSIS — M47816 Spondylosis without myelopathy or radiculopathy, lumbar region: Secondary | ICD-10-CM | POA: Diagnosis not present

## 2014-08-29 NOTE — Telephone Encounter (Signed)
Bernadette at [facility] was informed rx sent. Message was received from [facility] requesting refill on patient's behalf.

## 2014-09-01 ENCOUNTER — Emergency Department (HOSPITAL_COMMUNITY): Payer: Medicare Other

## 2014-09-01 ENCOUNTER — Encounter (HOSPITAL_COMMUNITY): Payer: Self-pay | Admitting: Emergency Medicine

## 2014-09-01 ENCOUNTER — Inpatient Hospital Stay (HOSPITAL_COMMUNITY)
Admission: EM | Admit: 2014-09-01 | Discharge: 2014-09-03 | DRG: 313 | Disposition: A | Discharge status: Home / Self Care | Payer: Medicare Other | Attending: Internal Medicine | Admitting: Internal Medicine

## 2014-09-01 DIAGNOSIS — R0789 Other chest pain: Secondary | ICD-10-CM | POA: Diagnosis present

## 2014-09-01 DIAGNOSIS — G894 Chronic pain syndrome: Secondary | ICD-10-CM | POA: Diagnosis present

## 2014-09-01 DIAGNOSIS — I5032 Chronic diastolic (congestive) heart failure: Secondary | ICD-10-CM | POA: Diagnosis present

## 2014-09-01 DIAGNOSIS — R079 Chest pain, unspecified: Secondary | ICD-10-CM | POA: Diagnosis present

## 2014-09-01 DIAGNOSIS — Z7901 Long term (current) use of anticoagulants: Secondary | ICD-10-CM

## 2014-09-01 DIAGNOSIS — E039 Hypothyroidism, unspecified: Secondary | ICD-10-CM | POA: Diagnosis not present

## 2014-09-01 DIAGNOSIS — I10 Essential (primary) hypertension: Secondary | ICD-10-CM | POA: Diagnosis present

## 2014-09-01 DIAGNOSIS — Z7983 Long term (current) use of bisphosphonates: Secondary | ICD-10-CM | POA: Diagnosis not present

## 2014-09-01 DIAGNOSIS — M549 Dorsalgia, unspecified: Secondary | ICD-10-CM | POA: Diagnosis present

## 2014-09-01 DIAGNOSIS — Z87891 Personal history of nicotine dependence: Secondary | ICD-10-CM | POA: Diagnosis not present

## 2014-09-01 DIAGNOSIS — I480 Paroxysmal atrial fibrillation: Secondary | ICD-10-CM | POA: Diagnosis not present

## 2014-09-01 DIAGNOSIS — D45 Polycythemia vera: Secondary | ICD-10-CM | POA: Diagnosis present

## 2014-09-01 DIAGNOSIS — G8929 Other chronic pain: Secondary | ICD-10-CM | POA: Diagnosis present

## 2014-09-01 LAB — I-STAT TROPONIN, ED
TROPONIN I, POC: 0.03 ng/mL (ref 0.00–0.08)
Troponin i, poc: 0.01 ng/mL (ref 0.00–0.08)

## 2014-09-01 LAB — URINALYSIS, ROUTINE W REFLEX MICROSCOPIC
Bilirubin Urine: NEGATIVE
Glucose, UA: NEGATIVE mg/dL
HGB URINE DIPSTICK: NEGATIVE
Ketones, ur: NEGATIVE mg/dL
Leukocytes, UA: NEGATIVE
NITRITE: NEGATIVE
PH: 6 (ref 5.0–8.0)
Protein, ur: NEGATIVE mg/dL
SPECIFIC GRAVITY, URINE: 1.01 (ref 1.005–1.030)
Urobilinogen, UA: 0.2 mg/dL (ref 0.0–1.0)

## 2014-09-01 LAB — CBC WITH DIFFERENTIAL/PLATELET
BASOS ABS: 0 10*3/uL (ref 0.0–0.1)
Basophils Relative: 0 % (ref 0–1)
EOS ABS: 0.2 10*3/uL (ref 0.0–0.7)
Eosinophils Relative: 1 % (ref 0–5)
HEMATOCRIT: 39.5 % (ref 36.0–46.0)
Hemoglobin: 13.1 g/dL (ref 12.0–15.0)
LYMPHS PCT: 8 % — AB (ref 12–46)
Lymphs Abs: 1.3 10*3/uL (ref 0.7–4.0)
MCH: 38.4 pg — AB (ref 26.0–34.0)
MCHC: 33.2 g/dL (ref 30.0–36.0)
MCV: 115.8 fL — ABNORMAL HIGH (ref 78.0–100.0)
Monocytes Absolute: 1.6 10*3/uL — ABNORMAL HIGH (ref 0.1–1.0)
Monocytes Relative: 10 % (ref 3–12)
NEUTROS PCT: 81 % — AB (ref 43–77)
Neutro Abs: 13.1 10*3/uL — ABNORMAL HIGH (ref 1.7–7.7)
Platelets: 469 10*3/uL — ABNORMAL HIGH (ref 150–400)
RBC: 3.41 MIL/uL — ABNORMAL LOW (ref 3.87–5.11)
RDW: 16.5 % — ABNORMAL HIGH (ref 11.5–15.5)
WBC: 16.2 10*3/uL — ABNORMAL HIGH (ref 4.0–10.5)

## 2014-09-01 LAB — COMPREHENSIVE METABOLIC PANEL
ALK PHOS: 59 U/L (ref 38–126)
ALT: 20 U/L (ref 14–54)
AST: 22 U/L (ref 15–41)
Albumin: 3.9 g/dL (ref 3.5–5.0)
Anion gap: 9 (ref 5–15)
BILIRUBIN TOTAL: 0.5 mg/dL (ref 0.3–1.2)
BUN: 29 mg/dL — ABNORMAL HIGH (ref 6–20)
CALCIUM: 10 mg/dL (ref 8.9–10.3)
CO2: 28 mmol/L (ref 22–32)
CREATININE: 1.2 mg/dL — AB (ref 0.44–1.00)
Chloride: 93 mmol/L — ABNORMAL LOW (ref 101–111)
GFR calc Af Amer: 47 mL/min — ABNORMAL LOW (ref >60)
GFR calc non Af Amer: 41 mL/min — ABNORMAL LOW (ref >60)
GLUCOSE: 99 mg/dL (ref 65–99)
Potassium: 3.7 mmol/L (ref 3.5–5.1)
SODIUM: 130 mmol/L — AB (ref 135–145)
TOTAL PROTEIN: 6.2 g/dL — AB (ref 6.5–8.1)

## 2014-09-01 LAB — BRAIN NATRIURETIC PEPTIDE: B NATRIURETIC PEPTIDE 5: 502.4 pg/mL — AB (ref 0.0–100.0)

## 2014-09-01 LAB — PROTIME-INR
INR: 1.17 (ref 0.00–1.49)
Prothrombin Time: 15 seconds (ref 11.6–15.2)

## 2014-09-01 MED ORDER — NITROGLYCERIN 0.4 MG SL SUBL
0.4000 mg | SUBLINGUAL_TABLET | SUBLINGUAL | Status: DC | PRN
  Administered 2014-09-01: 0.4 mg via SUBLINGUAL
  Filled 2014-09-01: qty 1

## 2014-09-01 NOTE — ED Notes (Signed)
Pt back from x-ray.

## 2014-09-01 NOTE — ED Provider Notes (Signed)
CSN: [id number]     Arrival date & time 09/01/14  1724 History   First MD Initiated Contact with Patient 09/01/14 1757     Chief Complaint  Patient presents with  . Chest Pain     (Consider location/radiation/quality/duration/timing/severity/associated sxs/prior Treatment) HPI Patient is a 81 year old female with a history of hypertension, chronic back pain, paroxysmal A. fib on Xarelto presented today for chest pain. Patient reports she was at rest when she started having this intermittent sharp crampy pain in her chest radiating to her neck. She denies any pleuritic component. She did have unspecified back procedure was performed as outpatient 3 days ago. She denies any shortness of breath lower extremity swelling or tenderness, history of PE/DVT. Reports that chest pain is intermittent with no aggravating factors. Chest pain was alleviated by nitroglycerin.  Past Medical History  Diagnosis Date  . PAF (paroxysmal atrial fibrillation)     a. on amio (02/2012 mild obstruction on PFT's) and xarelto.  . Essential hypertension   . Mitral valve disorders   . Tricuspid valve disorders, specified as nonrheumatic   . Neck mass     right, w/u including MRI negative  . Hypothyroidism   . Macular degeneration     Left, s/p inj. tx  . Senile osteoporosis     Reclast in the past  . Alopecia, unspecified   . Pure hypercholesterolemia   . Chronic diastolic CHF (congestive heart failure)     a. 11/2013 Echo: EF 60-65%, no rwma, mild TR, PASP 94mmHg.  [name] Kitchen Headache(784.0)   . Backache, unspecified   . Polycythemia vera(238.4)   . Gouty arthropathy, unspecified   . Insomnia, unspecified   . Other malaise and fatigue   . Hyposmolality and/or hyponatremia   . Hyperpotassemia   . Edema   . Closed fracture of lower end of radius with ulna   . Hypercalcemia   . Deafness     Left, s/p multiple surgeries  . Abnormal stress test     a. 11/2011 Ex MV: EF 80%, small, partially reversible anteroapical  defect->mild ischemia vs attenuation-->med Rx.   Past Surgical History  Procedure Laterality Date  . Breast biopsy  06/18/1998    left  . Total abdominal hysterectomy    . Tonsillectomy    . Breast biopsy    . Tympanoplasty Bilateral    Family History  Problem Relation Age of Onset  . Hypertension Father   . Heart disease Father     patient does not know details.   . Breast cancer Daughter   . Cancer Daughter     breast  . Ovarian cancer Mother 68  . Cancer Sister     colon  . Heart disease Sister   . Cancer Sister   . Cancer Sister     hodgkin disease   History  Substance Use Topics  . Smoking status: Former Smoker    Types: Cigarettes    Quit date: 01/19/1984  . Smokeless tobacco: Never Used  . Alcohol Use: 0.6 oz/week    1 Glasses of wine per week     Comment: 2 per day/ 14 a week   OB History    No data available     Review of Systems  Constitutional: Negative for fever and chills.  HENT: Negative for congestion and sore throat.   Eyes: Negative for pain.  Respiratory: Positive for chest tightness. Negative for cough and shortness of breath.   Cardiovascular: Positive for chest pain. Negative for palpitations and  leg swelling.  Gastrointestinal: Negative for nausea, vomiting, abdominal pain and diarrhea.  Genitourinary: Negative for dysuria and flank pain.  Musculoskeletal: Negative for back pain and neck pain.  Skin: Negative for rash.  Allergic/Immunologic: Negative.   Neurological: Negative for dizziness and light-headedness.  Psychiatric/Behavioral: Negative for confusion.   Allergies  Amlodipine  Home Medications   Prior to Admission medications   Medication Sig Start Date End Date Taking? Authorizing Provider  acetaminophen (TYLENOL) 650 MG CR tablet Take 650 mg by mouth 3 (three) times daily.   Yes Historical Provider, MD  amiodarone (PACERONE) 200 MG tablet Take 1 tablet (200 mg total) by mouth daily. 12/10/13  Yes Larey Dresser, MD  Biotin  5000 MCG TABS Take 5,000 mcg by mouth daily.   Yes Historical Provider, MD  cholecalciferol (VITAMIN D) 1000 UNITS tablet Take 2,000 Units by mouth daily.    Yes Historical Provider, MD  fentaNYL (DURAGESIC - DOSED MCG/HR) 25 MCG/HR patch Place 25 mcg onto the skin every 3 (three) days.   Yes Historical Provider, MD  furosemide (LASIX) 40 MG tablet Take 80 mg by mouth 2 (two) times daily.   Yes Historical Provider, MD  gabapentin (NEURONTIN) 100 MG capsule Take 1 capsule (100 mg total) by mouth at bedtime. 05/14/14  Yes Lyndal Pulley, DO  hydrALAZINE (APRESOLINE) 50 MG tablet Take 1.5 tablets (75 mg total) by mouth 3 (three) times daily. 11/30/13  Yes Larey Dresser, MD  hydroxyurea (HYDREA) 500 MG capsule TAKE 2 CAPSULES ON MONDAYS AND TAKE 1 CAPSULE ALL OTHER DAYS OF THE WEEK. Patient taking differently: TAKE 2 CAPSULES BY MOUTH ON MONDAYS AND TAKE 1 CAPSULE BY MOUTH ALL OTHER DAYS OF THE WEEK. 12/17/13  Yes Ni Gorsuch, MD  ibandronate (BONIVA) 150 MG tablet TAKE ONE TABLET ONCE A MONTH IN THE MORNING ON AN EMPTY STOMACH WITH WATER. REMAIN UPRIGHT. 04/12/14  Yes Larey Dresser, MD  levothyroxine (SYNTHROID, LEVOTHROID) 112 MCG tablet TAKE 1 TABLET EACH DAY. 08/29/14  Yes Tiffany L Reed, DO  Magnesium 250 MG TABS Take 250 mg by mouth daily.    Yes Historical Provider, MD  Melatonin 5 MG CAPS Take 5 mg by mouth at bedtime.   Yes Historical Provider, MD  Multiple Vitamins-Minerals (PRESERVISION AREDS 2) CAPS Take 1 capsule by mouth 2 (two) times daily.   Yes Historical Provider, MD  polyethylene glycol (MIRALAX / GLYCOLAX) packet Take 17 g by mouth daily as needed (constipation).    Yes Historical Provider, MD  potassium chloride (K-DUR) 10 MEQ tablet Take 20 mEq by mouth daily.   Yes Historical Provider, MD  senna-docusate (SENOKOT-S) 8.6-50 MG per tablet Take 2 tablets by mouth 2 (two) times daily. 08/07/14  Yes Tiffany L Reed, DO  traMADol (ULTRAM) 50 MG tablet Take 1 tablet (50 mg total) by mouth  every 6 (six) hours as needed. Patient taking differently: Take 50 mg by mouth every 6 (six) hours as needed (pain).  08/16/14  Yes Tiffany L Reed, DO  XARELTO 15 MG TABS tablet TAKE 1 TABLET ONCE DAILY. Patient taking differently: TAKE 1 TABLET ONCE DAILY WITH SUPPER 08/20/14  Yes Larey Dresser, MD  fentaNYL (DURAGESIC - DOSED MCG/HR) 12 MCG/HR Apply one patch every 3 days for pain. Remove old patch and rotate sites. Patient not taking: Reported on 09/01/2014 07/25/14   Lauree Chandler, NP  potassium chloride SA (K-DUR,KLOR-CON) 20 MEQ tablet Take 1 tablet (20 mEq total) by mouth daily. Patient not taking: Reported  on 09/01/2014 12/14/13   Larey Dresser, MD   BP 140/65 mmHg  Pulse 88  Temp(Src) 97.8 F (36.6 C) (Oral)  Resp 30  Ht 5\' 1"  (1.549 m)  Wt 114 lb 10.2 oz (52 kg)  BMI 21.67 kg/m2  SpO2 94% Physical Exam  Constitutional: She is oriented to person, place, and time. She appears well-developed and well-nourished. No distress.  HENT:  Head: Normocephalic and atraumatic.  Eyes: Conjunctivae and EOM are normal. Pupils are equal, round, and reactive to light.  Neck: Normal range of motion. Neck supple.  Cardiovascular: Normal rate, S1 normal, S2 normal and normal heart sounds.  An irregularly irregular rhythm present.  Pulses:      Radial pulses are 2+ on the right side, and 2+ on the left side.       Dorsalis pedis pulses are 2+ on the right side, and 2+ on the left side.       Posterior tibial pulses are 2+ on the right side, and 2+ on the left side.  Pulmonary/Chest: Effort normal and breath sounds normal. No accessory muscle usage. No respiratory distress. She has no decreased breath sounds.  Abdominal: Soft. Bowel sounds are normal. There is no tenderness.  Musculoskeletal: Normal range of motion.  Neurological: She is alert and oriented to person, place, and time. She has normal strength and normal reflexes. No cranial nerve deficit or sensory deficit. GCS eye subscore is 4.  GCS verbal subscore is 5. GCS motor subscore is 6.  Skin: Skin is warm and dry. She is not diaphoretic.  Psychiatric: She has a normal mood and affect.    ED Course  Procedures (including critical care time) Labs Review Labs Reviewed  COMPREHENSIVE METABOLIC PANEL - Abnormal; Notable for the following:    Sodium 130 (*)    Chloride 93 (*)    BUN 29 (*)    Creatinine, Ser 1.20 (*)    Total Protein 6.2 (*)    GFR calc non Af Amer 41 (*)    GFR calc Af Amer 47 (*)    All other components within normal limits  BRAIN NATRIURETIC PEPTIDE - Abnormal; Notable for the following:    B Natriuretic Peptide 502.4 (*)    All other components within normal limits  CBC WITH DIFFERENTIAL/PLATELET - Abnormal; Notable for the following:    WBC 16.2 (*)    RBC 3.41 (*)    MCV 115.8 (*)    MCH 38.4 (*)    RDW 16.5 (*)    Platelets 469 (*)    Neutrophils Relative % 81 (*)    Lymphocytes Relative 8 (*)    Neutro Abs 13.1 (*)    Monocytes Absolute 1.6 (*)    All other components within normal limits  TROPONIN I - Abnormal; Notable for the following:    Troponin I 0.05 (*)    All other components within normal limits  CBC WITH DIFFERENTIAL/PLATELET - Abnormal; Notable for the following:    WBC 12.4 (*)    RBC 3.38 (*)    MCV 115.7 (*)    MCH 38.5 (*)    RDW 16.6 (*)    Platelets 425 (*)    Neutrophils Relative % 79 (*)    Lymphocytes Relative 10 (*)    Neutro Abs 9.9 (*)    Monocytes Absolute 1.2 (*)    All other components within normal limits  COMPREHENSIVE METABOLIC PANEL - Abnormal; Notable for the following:    Potassium 3.4 (*)  BUN 21 (*)    Total Protein 5.9 (*)    All other components within normal limits  URINE CULTURE  MRSA PCR SCREENING  CULTURE, BLOOD (ROUTINE X 2)  CULTURE, BLOOD (ROUTINE X 2)  PROTIME-INR  URINALYSIS, ROUTINE W REFLEX MICROSCOPIC (NOT AT [facility]  TROPONIN I  TROPONIN I  I-STAT TROPOININ, ED  I-STAT TROPOININ, ED  I-STAT TROPOININ, ED  I-STAT  TROPOININ, ED  Randolm Idol, ED    Imaging Review Dg Chest 2 View  09/01/2014   CLINICAL DATA:  Left lower chest pain beginning 4 hr ago. Personal history of atrial fibrillation and hypertension as well as smoking.  EXAM: CHEST  2 VIEW  COMPARISON:  12/06/2013.  Lumbar MRI 04/15/2014.  FINDINGS: There is chronic cardiomegaly. The aorta is unfolded. There is chronic linear scarring at both lung bases. There is chronic nodal calcification in the left hilum. There are newly seen compression fractures in the lumbar spine, not present on previous studies.  IMPRESSION: Chronic cardiomegaly. Chronic basilar pulmonary scarring. Chronic calcified hilar lymph nodes on the left.  Lumbar compression fractures, age indeterminate but not present on previous MRI dated 04/15/2014   Electronically Signed   By: Nelson Chimes M.D.   On: 09/01/2014 19:09     EKG Interpretation   Date/Time:  Sunday September 01 2014 20:23:58 EDT Ventricular Rate:  70 PR Interval:  237 QRS Duration: 107 QT Interval:  448 QTC Calculation: 483 R Axis:   -11 Text Interpretation:  Sinus rhythm Atrial premature complexes Prolonged PR  interval Probable left atrial enlargement Borderline low voltage,  extremity leads RSR' in V1 or V2, right VCD or RVH Nonspecific T  abnormalities, anterior leads ED PHYSICIAN INTERPRETATION AVAILABLE IN  [facility] Confirmed by TEST, Record ([id number]) on 09/02/2014 6:55:27 AM      MDM   Final diagnoses:  Chest pain, unspecified chest pain type    On arrival to the emergency department patient was hemodynamically stable and in no acute distress. Given nitroglycerin in the emergency department with mild relief. EKG showed A. fib with no acute ischemic changes at this time. Troponin and delta troponin were negative. Chest x-ray showing no acute cardiopulmonary processes. No widened mediastinum and equal pulses bilaterally. Doubt dissection at this time. Patient did have for seizure but no pleuritic  chest pain with no shortness of breath, or tachypnea. Doubt PE at this time. Patient continued to have intermittent chest pain in the emergency department. Repeat EKG showed no ischemic changes. She subsequently admitted to hospitalist Dr. Posey Pronto for further evaluation of chest pain.   If performed, labs, EKGs, and imaging were reviewed/interpreted by myself and my attending and incorporated into medical decision making.  Discussed pertinent finding with patient or caregiver prior to admission with no further questions.  Pt care supervised by my attending Dr. Johnney Killian.   Geronimo Boot, MD PGY-2  Emergency Medicine    Geronimo Boot, MD 09/02/14 1222  Charlesetta Shanks, MD 09/08/14 [phone]

## 2014-09-01 NOTE — ED Notes (Signed)
Family reported call bell not working, Therapist, sports fixed and tested the light.

## 2014-09-01 NOTE — ED Notes (Signed)
Per EMS- pt complains of new onset left sided chest pain that began while at rest. No change on inspiration or palpation. Pt rated 4/10. Pt was given 324 aspirin and 2 nitro, did experience relief of chest pain with nitro. Pt denies sob, nausea, vomiting, diaphoresis, etc. Hx of CHF. Hx of atrial fibrillation.

## 2014-09-01 NOTE — ED Notes (Signed)
Pt transporting to xray.  

## 2014-09-01 NOTE — ED Notes (Addendum)
Daughter's number [phone] (cell) - Doristine Mango  Call home number if needed tonight [phone].

## 2014-09-02 ENCOUNTER — Observation Stay (HOSPITAL_COMMUNITY): Payer: Medicare Other

## 2014-09-02 DIAGNOSIS — E039 Hypothyroidism, unspecified: Secondary | ICD-10-CM | POA: Diagnosis not present

## 2014-09-02 DIAGNOSIS — I480 Paroxysmal atrial fibrillation: Secondary | ICD-10-CM

## 2014-09-02 DIAGNOSIS — I5032 Chronic diastolic (congestive) heart failure: Secondary | ICD-10-CM | POA: Diagnosis not present

## 2014-09-02 DIAGNOSIS — D45 Polycythemia vera: Secondary | ICD-10-CM

## 2014-09-02 DIAGNOSIS — R079 Chest pain, unspecified: Secondary | ICD-10-CM | POA: Diagnosis not present

## 2014-09-02 DIAGNOSIS — R0789 Other chest pain: Secondary | ICD-10-CM | POA: Diagnosis not present

## 2014-09-02 DIAGNOSIS — I10 Essential (primary) hypertension: Secondary | ICD-10-CM | POA: Diagnosis not present

## 2014-09-02 LAB — CBC WITH DIFFERENTIAL/PLATELET
Basophils Absolute: 0 10*3/uL (ref 0.0–0.1)
Basophils Relative: 0 % (ref 0–1)
Eosinophils Absolute: 0.1 10*3/uL (ref 0.0–0.7)
Eosinophils Relative: 1 % (ref 0–5)
HCT: 39.1 % (ref 36.0–46.0)
HEMOGLOBIN: 13 g/dL (ref 12.0–15.0)
LYMPHS PCT: 10 % — AB (ref 12–46)
Lymphs Abs: 1.2 10*3/uL (ref 0.7–4.0)
MCH: 38.5 pg — AB (ref 26.0–34.0)
MCHC: 33.2 g/dL (ref 30.0–36.0)
MCV: 115.7 fL — AB (ref 78.0–100.0)
MONO ABS: 1.2 10*3/uL — AB (ref 0.1–1.0)
Monocytes Relative: 10 % (ref 3–12)
NEUTROS ABS: 9.9 10*3/uL — AB (ref 1.7–7.7)
Neutrophils Relative %: 79 % — ABNORMAL HIGH (ref 43–77)
PLATELETS: 425 10*3/uL — AB (ref 150–400)
RBC: 3.38 MIL/uL — ABNORMAL LOW (ref 3.87–5.11)
RDW: 16.6 % — ABNORMAL HIGH (ref 11.5–15.5)
WBC: 12.4 10*3/uL — AB (ref 4.0–10.5)

## 2014-09-02 LAB — NM MYOCAR MULTI W/SPECT W/WALL MOTION / EF
CHL CUP NUCLEAR SDS: 2
CHL CUP RESTING HR STRESS: 61 {beats}/min
LHR: 0.5
LV sys vol: 27 mL
LVDIAVOL: 74 mL
Peak HR: 85 {beats}/min
SRS: 4
SSS: 6
TID: 0.74

## 2014-09-02 LAB — COMPREHENSIVE METABOLIC PANEL
ALT: 17 U/L (ref 14–54)
AST: 20 U/L (ref 15–41)
Albumin: 3.8 g/dL (ref 3.5–5.0)
Alkaline Phosphatase: 52 U/L (ref 38–126)
Anion gap: 8 (ref 5–15)
BILIRUBIN TOTAL: 0.9 mg/dL (ref 0.3–1.2)
BUN: 21 mg/dL — AB (ref 6–20)
CALCIUM: 9.9 mg/dL (ref 8.9–10.3)
CO2: 28 mmol/L (ref 22–32)
CREATININE: 0.86 mg/dL (ref 0.44–1.00)
Chloride: 101 mmol/L (ref 101–111)
GFR calc Af Amer: >60 mL/min (ref >60)
GFR calc non Af Amer: >60 mL/min (ref >60)
Glucose, Bld: 90 mg/dL (ref 65–99)
Potassium: 3.4 mmol/L — ABNORMAL LOW (ref 3.5–5.1)
SODIUM: 137 mmol/L (ref 135–145)
Total Protein: 5.9 g/dL — ABNORMAL LOW (ref 6.5–8.1)

## 2014-09-02 LAB — MRSA PCR SCREENING: MRSA BY PCR: NEGATIVE

## 2014-09-02 LAB — TROPONIN I
TROPONIN I: 0.03 ng/mL (ref <0.031)
Troponin I: <0.03 ng/mL (ref <0.031)
Troponin I: 0.05 ng/mL — ABNORMAL HIGH (ref <0.031)

## 2014-09-02 LAB — I-STAT TROPONIN, ED
TROPONIN I, POC: 0.02 ng/mL (ref 0.00–0.08)
TROPONIN I, POC: 0.03 ng/mL (ref 0.00–0.08)

## 2014-09-02 MED ORDER — FENTANYL 25 MCG/HR TD PT72: 25.0000 ug | MEDICATED_PATCH | TRANSDERMAL | Status: DC

## 2014-09-02 MED ORDER — TECHNETIUM TC 99M SESTAMIBI - CARDIOLITE
30.0000 | Freq: Once | INTRAVENOUS | Status: AC | PRN
  Administered 2014-09-02: 15:00:00 30 via INTRAVENOUS

## 2014-09-02 MED ORDER — AMIODARONE HCL 200 MG PO TABS
200.0000 mg | ORAL_TABLET | Freq: Every day | ORAL | Status: DC
  Administered 2014-09-02 – 2014-09-03 (×2): 200 mg via ORAL
  Filled 2014-09-02 (×4): qty 1

## 2014-09-02 MED ORDER — RIVAROXABAN 15 MG PO TABS
15.0000 mg | ORAL_TABLET | Freq: Every day | ORAL | Status: DC
  Administered 2014-09-02 (×2): 15 mg via ORAL
  Filled 2014-09-02 (×4): qty 1

## 2014-09-02 MED ORDER — TRAMADOL HCL 50 MG PO TABS
50.0000 mg | ORAL_TABLET | Freq: Four times a day (QID) | ORAL | Status: DC | PRN
  Administered 2014-09-02: 50 mg via ORAL
  Filled 2014-09-02: qty 1

## 2014-09-02 MED ORDER — HYDRALAZINE HCL 50 MG PO TABS
75.0000 mg | ORAL_TABLET | Freq: Three times a day (TID) | ORAL | Status: DC
  Administered 2014-09-02: 75 mg via ORAL
  Filled 2014-09-02 (×6): qty 1

## 2014-09-02 MED ORDER — REGADENOSON 0.4 MG/5ML IV SOLN
0.4000 mg | Freq: Once | INTRAVENOUS | Status: AC
  Administered 2014-09-02: 0.4 mg via INTRAVENOUS
  Filled 2014-09-02: qty 5

## 2014-09-02 MED ORDER — FENTANYL 25 MCG/HR TD PT72
25.0000 ug | MEDICATED_PATCH | TRANSDERMAL | Status: DC
  Filled 2014-09-02: qty 1

## 2014-09-02 MED ORDER — REGADENOSON 0.4 MG/5ML IV SOLN
INTRAVENOUS | Status: AC
  Filled 2014-09-02: qty 5

## 2014-09-02 MED ORDER — HYDROXYUREA 500 MG PO CAPS
500.0000 mg | ORAL_CAPSULE | Freq: Every day | ORAL | Status: DC
  Administered 2014-09-02 – 2014-09-03 (×2): 500 mg via ORAL
  Filled 2014-09-02 (×2): qty 1

## 2014-09-02 MED ORDER — ONDANSETRON HCL 4 MG/2ML IJ SOLN: 4.0000 mg | Freq: Four times a day (QID) | INTRAMUSCULAR | Status: DC | PRN

## 2014-09-02 MED ORDER — ASPIRIN EC 81 MG PO TBEC
81.0000 mg | DELAYED_RELEASE_TABLET | Freq: Every day | ORAL | Status: DC
  Administered 2014-09-02 – 2014-09-03 (×2): 81 mg via ORAL
  Filled 2014-09-02 (×2): qty 1

## 2014-09-02 MED ORDER — ASPIRIN EC 81 MG PO TBEC
81.0000 mg | DELAYED_RELEASE_TABLET | Freq: Every day | ORAL | Status: DC
  Filled 2014-09-02: qty 1

## 2014-09-02 MED ORDER — FUROSEMIDE 80 MG PO TABS
80.0000 mg | ORAL_TABLET | Freq: Two times a day (BID) | ORAL | Status: DC
  Administered 2014-09-02 – 2014-09-03 (×3): 80 mg via ORAL
  Filled 2014-09-02 (×3): qty 1
  Filled 2014-09-02: qty 4
  Filled 2014-09-02: qty 1

## 2014-09-02 MED ORDER — LEVOTHYROXINE SODIUM 112 MCG PO TABS
112.0000 ug | ORAL_TABLET | Freq: Every day | ORAL | Status: DC
  Administered 2014-09-02 – 2014-09-03 (×2): 112 ug via ORAL
  Filled 2014-09-02 (×4): qty 1

## 2014-09-02 MED ORDER — MORPHINE SULFATE 2 MG/ML IJ SOLN: 2.0000 mg | INTRAMUSCULAR | Status: DC | PRN

## 2014-09-02 MED ORDER — GABAPENTIN 100 MG PO CAPS
100.0000 mg | ORAL_CAPSULE | Freq: Every day | ORAL | Status: DC
  Administered 2014-09-02 (×2): 100 mg via ORAL
  Filled 2014-09-02 (×4): qty 1

## 2014-09-02 MED ORDER — ACETAMINOPHEN 325 MG PO TABS: 650.0000 mg | ORAL_TABLET | ORAL | Status: DC | PRN

## 2014-09-02 MED ORDER — SENNOSIDES-DOCUSATE SODIUM 8.6-50 MG PO TABS
2.0000 | ORAL_TABLET | Freq: Two times a day (BID) | ORAL | Status: DC
Start: 2014-09-02 — End: 2014-09-03
  Administered 2014-09-02 – 2014-09-03 (×3): 2 via ORAL
  Filled 2014-09-02 (×6): qty 2

## 2014-09-02 MED ORDER — TECHNETIUM TC 99M SESTAMIBI GENERIC - CARDIOLITE
10.0000 | Freq: Once | INTRAVENOUS | Status: AC | PRN
  Administered 2014-09-02: 10 via INTRAVENOUS

## 2014-09-02 NOTE — Consult Note (Signed)
Admit date: 09/01/2014 Referring Physician  Dr. Daleen Bo Primary Physician  Dr. Mariea Clonts Primary Cardiologist  Dr. Aundra Dubin Reason for Consultation  Chest pain  HPI: This is a very pleasant 81yo WF with a history of PAF on Xarelto, HTN, hypothyroidism, dyslipidemia and chronic diastolic CHF who was in her USOH until yesterday afternoon about 3:30pm when she started having CP.  She was sitting resting at the time of onset.  She is unable to describe the nature of the pain but it was severe.  She did not try taking anything for it.  She told the hospitalist that she had been having this pain off and on for the past few days lasting a few minutes at a time but told me the pain was new yesterday.  The pain waxed and waned yesterday and into the evening and she presented to the ER.  She currently is pain free.  She denied any SOB, diaphoresis, nausea with the discomfort.  She does have chronic LE edema which has increased recently but no SOB.  Her Troponin is negative x 3.  Cardiology is now asked to consult for further evaluation of CP.     PMH:   Past Medical History  Diagnosis Date  . PAF (paroxysmal atrial fibrillation)     a. on amio (02/2012 mild obstruction on PFT's) and xarelto.  . Essential hypertension   . Mitral valve disorders   . Tricuspid valve disorders, specified as nonrheumatic   . Neck mass     right, w/u including MRI negative  . Hypothyroidism   . Macular degeneration     Left, s/p inj. tx  . Senile osteoporosis     Reclast in the past  . Alopecia, unspecified   . Pure hypercholesterolemia   . Chronic diastolic CHF (congestive heart failure)     a. 11/2013 Echo: EF 60-65%, no rwma, mild TR, PASP 64mmHg.  [name] Kitchen Headache(784.0)   . Backache, unspecified   . Polycythemia vera(238.4)   . Gouty arthropathy, unspecified   . Insomnia, unspecified   . Other malaise and fatigue   . Hyposmolality and/or hyponatremia   . Hyperpotassemia   . Edema   . Closed fracture of lower end of  radius with ulna   . Hypercalcemia   . Deafness     Left, s/p multiple surgeries  . Abnormal stress test     a. 11/2011 Ex MV: EF 80%, small, partially reversible anteroapical defect->mild ischemia vs attenuation-->med Rx.     PSH:   Past Surgical History  Procedure Laterality Date  . Breast biopsy  06/18/1998    left  . Total abdominal hysterectomy    . Tonsillectomy    . Breast biopsy    . Tympanoplasty Bilateral     Allergies:  Amlodipine Prior to Admit Meds:   (Not in a hospital admission) Fam HX:    Family History  Problem Relation Age of Onset  . Hypertension Father   . Heart disease Father     patient does not know details.   . Breast cancer Daughter   . Cancer Daughter     breast  . Ovarian cancer Mother 75  . Cancer Sister     colon  . Heart disease Sister   . Cancer Sister   . Cancer Sister     hodgkin disease   Social HX:    History   Social History  . Marital Status: Married    Spouse Name: [name]  . Number of Children:  2  . Years of Education: 16   Occupational History  . Not on file.   Social History Main Topics  . Smoking status: Former Smoker    Types: Cigarettes    Quit date: 01/19/1984  . Smokeless tobacco: Never Used  . Alcohol Use: 0.6 oz/week    1 Glasses of wine per week     Comment: 2 per day/ 14 a week  . Drug Use: No  . Sexual Activity: Not Currently   Other Topics Concern  . Not on file   Social History Narrative   Patient is Married 1955. 2 kids. 87 grandkid-65 years old in 2016. [employer] graduate [name]).    Lives in apartment,  Independent Living  section at [place] since 02/2013.      Stay at home mother.       No Smoking history, Mod. alcohol use.   Patient has a living will, POA      Hobbies: CPU and painting              ROS:  All 11 ROS were addressed and are negative except what is stated in the HPI  Physical Exam: Blood pressure 154/60, pulse 52, temperature 98.9 F (37.2  C), temperature source Oral, resp. rate 20, SpO2 94 %.    General: Well developed, well nourished, in no acute distress Head: Eyes PERRLA, No xanthomas.   Normal cephalic and atramatic  Lungs:   Clear bilaterally to auscultation and percussion. Heart:   HRRR S1 S2 Pulses are 2+ & equal.            No carotid bruit. No JVD.  No abdominal bruits. No femoral bruits. Abdomen: Bowel sounds are positive, abdomen soft and non-tender without masses  Extremities:   No clubbing, cyanosis or edema.  DP +1 Neuro: Alert and oriented X 3. Psych:  Good affect, responds appropriately    Labs:   Lab Results  Component Value Date   WBC 12.4* 09/02/2014   HGB 13.0 09/02/2014   HCT 39.1 09/02/2014   MCV 115.7* 09/02/2014   PLT 425* 09/02/2014    Recent Labs Lab 09/02/14 0641  NA 137  K 3.4*  CL 101  CO2 28  BUN 21*  CREATININE 0.86  CALCIUM 9.9  PROT 5.9*  BILITOT 0.9  ALKPHOS 52  ALT 17  AST 20  GLUCOSE 90   No results found for: PTT Lab Results  Component Value Date   INR 1.17 09/01/2014   INR 2.2* 12/05/2013   Lab Results  Component Value Date   CKTOTAL 24 04/15/2014   TROPONINI <0.03 09/02/2014     Lab Results  Component Value Date   CHOL 189 02/22/2012   Lab Results  Component Value Date   HDL 85.90 02/22/2012   Lab Results  Component Value Date   LDLCALC 85 02/22/2012   Lab Results  Component Value Date   TRIG 92.0 02/22/2012   Lab Results  Component Value Date   CHOLHDL 2 02/22/2012   No results found for: LDLDIRECT    Radiology:  Dg Chest 2 View  09/01/2014   CLINICAL DATA:  Left lower chest pain beginning 4 hr ago. Personal history of atrial fibrillation and hypertension as well as smoking.  EXAM: CHEST  2 VIEW  COMPARISON:  12/06/2013.  Lumbar MRI 04/15/2014.  FINDINGS: There is chronic cardiomegaly. The aorta is unfolded. There is chronic linear scarring at both lung bases. There is chronic nodal calcification in the  left hilum. There are newly seen  compression fractures in the lumbar spine, not present on previous studies.  IMPRESSION: Chronic cardiomegaly. Chronic basilar pulmonary scarring. Chronic calcified hilar lymph nodes on the left.  Lumbar compression fractures, age indeterminate but not present on previous MRI dated 04/15/2014   Electronically Signed   By: Nelson Chimes M.D.   On: 09/01/2014 19:09    EKG:  NSR with PAC's, IRBBB  ASSESSMENT/PLAN: 1.  Chest pain of unclear etiology. Her EKG is nonischemic.  She has a hard time describing her pain.  Her cardiac enzymes are negative.  Stress myoview showed small partially reversible anteroapical perfusion defect c/w mild ischemia vs. Attenuation 12/2011.  She is NPO so will get a Lexiscan myoview to rule out ischemia today.  2D echo pending.   2. PAF maintaining NSR with PAC's on Amio and Xarelto 3. Chronic diastolic CHF - appears euvolemic on exam today and chest xray with no edema.  Continue Lasix. 4. HTN - controlled   Sueanne Margarita, MD  09/02/2014  8:53 AM

## 2014-09-02 NOTE — Progress Notes (Signed)
[facility] PROGRESS NOTE  Sarah Mays YQM:[id number] DOB: July 18, 1930 DOA: 09/01/2014 PCP: Hollace Kinnier, DO  Assessment/Plan: 81 y/o female with PMH of HTN, Hypothyroidism, PAF, Chronic pain on fentanyl is admitted for evaluation of chest pain   1. Chest pain. Atypical. Likely non cardiac, musculoskeletal. Mild tender on palpation. ECG/trop: unremarkable. Stress test: neg. Pend echo. cardiology is following   -no more new chest pains during inpatient stay  2. PAF on xarelto. Not on BB 3. Chronic pain on fentanyl. Stable  4. HTN. BP is soft. We will hold hydralazine.   D/c plans: 24-48 hrs. pend echo. Cardiology    Code Status: full Family Communication:  D/w patient, RN (indicate person spoken with, relationship, and if by phone, the number) Disposition Plan: home 24-48 hours    Consultants:  Cardiology   Procedures:  Stress test   Antibiotics:  None  (indicate start date, and stop date if known)  HPI/Subjective: alert  Objective: Filed Vitals:   09/02/14 1630  BP: 93/45  Pulse: 62  Temp: 97.7 F (36.5 C)  Resp: 18    Intake/Output Summary (Last 24 hours) at 09/02/14 1712 Last data filed at 09/02/14 1200  Gross per 24 hour  Intake      0 ml  Output    900 ml  Net   -900 ml   Filed Weights   09/02/14 0953  Weight: 52 kg (114 lb 10.2 oz)    Exam:   General:  No distress   Cardiovascular: s1,s2 rrr  Respiratory: CTA BL  Abdomen: soft, nt,nd   Musculoskeletal: no leg edema   Data Reviewed: Basic Metabolic Panel:  Recent Labs Lab 09/01/14 1945 09/02/14 0641  NA 130* 137  K 3.7 3.4*  CL 93* 101  CO2 28 28  GLUCOSE 99 90  BUN 29* 21*  CREATININE 1.20* 0.86  CALCIUM 10.0 9.9   Liver Function Tests:  Recent Labs Lab 09/01/14 1945 09/02/14 0641  AST 22 20  ALT 20 17  ALKPHOS 59 52  BILITOT 0.5 0.9  PROT 6.2* 5.9*  ALBUMIN 3.9 3.8   No results for input(s): LIPASE, AMYLASE in the last 168 hours. No results for  input(s): AMMONIA in the last 168 hours. CBC:  Recent Labs Lab 09/01/14 1945 09/02/14 0641  WBC 16.2* 12.4*  NEUTROABS 13.1* 9.9*  HGB 13.1 13.0  HCT 39.5 39.1  MCV 115.8* 115.7*  PLT 469* 425*   Cardiac Enzymes:  Recent Labs Lab 09/02/14 0037 09/02/14 0907  TROPONINI <0.03 0.05*   BNP (last 3 results)  Recent Labs  09/01/14 1946  BNP 502.4*    ProBNP (last 3 results)  Recent Labs  12/14/13 1430 07/19/14 1249 07/26/14 1342  PROBNP 225.0* 330.0* 289.0*    CBG: No results for input(s): GLUCAP in the last 168 hours.  Recent Results (from the past 240 hour(s))  Urine culture     Status: None (Preliminary result)   Collection Time: 09/01/14  8:53 PM  Result Value Ref Range Status   Specimen Description URINE, RANDOM  Final   Special Requests NONE  Final   Culture NO GROWTH < 12 HOURS  Final   Report Status PENDING  Incomplete  MRSA PCR Screening     Status: None   Collection Time: 09/02/14  9:53 AM  Result Value Ref Range Status   MRSA by PCR NEGATIVE NEGATIVE Final    Comment:        The GeneXpert MRSA Assay (FDA approved for NASAL specimens  only), is one component of a comprehensive MRSA colonization surveillance program. It is not intended to diagnose MRSA infection nor to guide or monitor treatment for MRSA infections.      Studies: Dg Chest 2 View  09/01/2014   CLINICAL DATA:  Left lower chest pain beginning 4 hr ago. Personal history of atrial fibrillation and hypertension as well as smoking.  EXAM: CHEST  2 VIEW  COMPARISON:  12/06/2013.  Lumbar MRI 04/15/2014.  FINDINGS: There is chronic cardiomegaly. The aorta is unfolded. There is chronic linear scarring at both lung bases. There is chronic nodal calcification in the left hilum. There are newly seen compression fractures in the lumbar spine, not present on previous studies.  IMPRESSION: Chronic cardiomegaly. Chronic basilar pulmonary scarring. Chronic calcified hilar lymph nodes on the left.   Lumbar compression fractures, age indeterminate but not present on previous MRI dated 04/15/2014   Electronically Signed   By: Nelson Chimes M.D.   On: 09/01/2014 19:09   Nm Myocar Multi W/spect W/wall Motion / Ef  09/02/2014    There was no ST segment deviation noted during stress.  No T wave inversion was noted during stress.  The study is normal.  The left ventricular ejection fraction is normal (55-65%).  Nuclear stress EF: 64%.   Normal study, no evidence for ischemia or infarction.     Scheduled Meds: . amiodarone  200 mg Oral Daily  . aspirin EC  81 mg Oral Daily  . [START ON 09/04/2014] fentaNYL  25 mcg Transdermal Q72H  . furosemide  80 mg Oral BID  . gabapentin  100 mg Oral QHS  . hydrALAZINE  75 mg Oral 3 times per day  . hydroxyurea  500 mg Oral Daily  . levothyroxine  112 mcg Oral QAC breakfast  . regadenoson      . rivaroxaban  15 mg Oral Q supper  . senna-docusate  2 tablet Oral BID   Continuous Infusions:   Principal Problem:   Chest pain Active Problems:   Paroxysmal atrial fibrillation   Chronic diastolic CHF (congestive heart failure)   Polycythemia vera   Hypothyroidism    Time spent: >35 minutes     Kinnie Feil  [facility] Pager [phone]. If 7PM-7AM, please contact night-coverage at [email or web address], password [email or web address] 09/02/2014, 5:12 PM  LOS: 1 day

## 2014-09-02 NOTE — H&P (Signed)
[facility] History and Physical  Patient: Sarah Mays  MRN: [id number]  DOB: [birth year]  DOS: the patient was seen and examined on 09/02/2014 PCP: Hollace Kinnier, DO  Referring physician: Dr. Vallery Ridge Chief Complaint: Chest pain  HPI: Sarah Mays is a 81 y.o. female with Past medical history of atrial fibrillation on anticoagulation, essential hypertension, hypothyroidism, polycythemia vera. The patient is presenting with complaints of chest pain. The patient is unable to describe the nature of the pain although the pain was severe. She mentions this pain has been ongoing since last few days occurring on and off and lasting for few minutes and resolving on its own. The pain is not associated with any severe exertion. Is not associated with food. She denies anything any shortness of breath cough fever chills nausea vomiting diarrhea burning urination constipation abdominal pain acid reflux recent travel. She complains that she has on and off ankle swelling. She denies any recent change in her medication and claims he is compliant with all her medications.  The patient is coming from home.  At her baseline ambulates without any support And is independent for most of her ADL manages her medication on her own.  Review of Systems: as mentioned in the history of present illness.  A comprehensive review of the other systems is negative.  Past Medical History  Diagnosis Date  . PAF (paroxysmal atrial fibrillation)     a. on amio (02/2012 mild obstruction on PFT's) and xarelto.  . Essential hypertension   . Mitral valve disorders   . Tricuspid valve disorders, specified as nonrheumatic   . Neck mass     right, w/u including MRI negative  . Hypothyroidism   . Macular degeneration     Left, s/p inj. tx  . Senile osteoporosis     Reclast in the past  . Alopecia, unspecified   . Pure hypercholesterolemia   . Chronic diastolic CHF (congestive heart failure)     a. 11/2013  Echo: EF 60-65%, no rwma, mild TR, PASP 64mmHg.  [name] Kitchen Headache(784.0)   . Backache, unspecified   . Polycythemia vera(238.4)   . Gouty arthropathy, unspecified   . Insomnia, unspecified   . Other malaise and fatigue   . Hyposmolality and/or hyponatremia   . Hyperpotassemia   . Edema   . Closed fracture of lower end of radius with ulna   . Hypercalcemia   . Deafness     Left, s/p multiple surgeries  . Abnormal stress test     a. 11/2011 Ex MV: EF 80%, small, partially reversible anteroapical defect->mild ischemia vs attenuation-->med Rx.   Past Surgical History  Procedure Laterality Date  . Breast biopsy  06/18/1998    left  . Total abdominal hysterectomy    . Tonsillectomy    . Breast biopsy    . Tympanoplasty Bilateral    Social History:  reports that she quit smoking about 30 years ago. Her smoking use included Cigarettes. She has never used smokeless tobacco. She reports that she drinks about 0.6 oz of alcohol per week. She reports that she does not use illicit drugs.  Allergies  Allergen Reactions  . Amlodipine Swelling    Lower extremity edema     Family History  Problem Relation Age of Onset  . Hypertension Father   . Heart disease Father     patient does not know details.   . Breast cancer Daughter   . Cancer Daughter     breast  . Ovarian cancer  Mother 33  . Cancer Sister     colon  . Heart disease Sister   . Cancer Sister   . Cancer Sister     hodgkin disease    Prior to Admission medications   Medication Sig Start Date End Date Taking? Authorizing Provider  acetaminophen (TYLENOL) 650 MG CR tablet Take 650 mg by mouth 3 (three) times daily.   Yes Historical Provider, MD  amiodarone (PACERONE) 200 MG tablet Take 1 tablet (200 mg total) by mouth daily. 12/10/13  Yes Larey Dresser, MD  Biotin 5000 MCG TABS Take 5,000 mcg by mouth daily.   Yes Historical Provider, MD  cholecalciferol (VITAMIN D) 1000 UNITS tablet Take 2,000 Units by mouth daily.    Yes  Historical Provider, MD  fentaNYL (DURAGESIC - DOSED MCG/HR) 25 MCG/HR patch Place 25 mcg onto the skin every 3 (three) days.   Yes Historical Provider, MD  furosemide (LASIX) 40 MG tablet Take 80 mg by mouth 2 (two) times daily.   Yes Historical Provider, MD  gabapentin (NEURONTIN) 100 MG capsule Take 1 capsule (100 mg total) by mouth at bedtime. 05/14/14  Yes Lyndal Pulley, DO  hydrALAZINE (APRESOLINE) 50 MG tablet Take 1.5 tablets (75 mg total) by mouth 3 (three) times daily. 11/30/13  Yes Larey Dresser, MD  hydroxyurea (HYDREA) 500 MG capsule TAKE 2 CAPSULES ON MONDAYS AND TAKE 1 CAPSULE ALL OTHER DAYS OF THE WEEK. Patient taking differently: TAKE 2 CAPSULES BY MOUTH ON MONDAYS AND TAKE 1 CAPSULE BY MOUTH ALL OTHER DAYS OF THE WEEK. 12/17/13  Yes Ni Gorsuch, MD  ibandronate (BONIVA) 150 MG tablet TAKE ONE TABLET ONCE A MONTH IN THE MORNING ON AN EMPTY STOMACH WITH WATER. REMAIN UPRIGHT. 04/12/14  Yes Larey Dresser, MD  levothyroxine (SYNTHROID, LEVOTHROID) 112 MCG tablet TAKE 1 TABLET EACH DAY. 08/29/14  Yes Tiffany L Reed, DO  Magnesium 250 MG TABS Take 250 mg by mouth daily.    Yes Historical Provider, MD  Melatonin 5 MG CAPS Take 5 mg by mouth at bedtime.   Yes Historical Provider, MD  Multiple Vitamins-Minerals (PRESERVISION AREDS 2) CAPS Take 1 capsule by mouth 2 (two) times daily.   Yes Historical Provider, MD  polyethylene glycol (MIRALAX / GLYCOLAX) packet Take 17 g by mouth daily as needed (constipation).    Yes Historical Provider, MD  potassium chloride (K-DUR) 10 MEQ tablet Take 20 mEq by mouth daily.   Yes Historical Provider, MD  senna-docusate (SENOKOT-S) 8.6-50 MG per tablet Take 2 tablets by mouth 2 (two) times daily. 08/07/14  Yes Tiffany L Reed, DO  traMADol (ULTRAM) 50 MG tablet Take 1 tablet (50 mg total) by mouth every 6 (six) hours as needed. Patient taking differently: Take 50 mg by mouth every 6 (six) hours as needed (pain).  08/16/14  Yes Tiffany L Reed, DO  XARELTO 15 MG  TABS tablet TAKE 1 TABLET ONCE DAILY. Patient taking differently: TAKE 1 TABLET ONCE DAILY WITH SUPPER 08/20/14  Yes Larey Dresser, MD  fentaNYL (DURAGESIC - DOSED MCG/HR) 12 MCG/HR Apply one patch every 3 days for pain. Remove old patch and rotate sites. Patient not taking: Reported on 09/01/2014 07/25/14   Lauree Chandler, NP  potassium chloride SA (K-DUR,KLOR-CON) 20 MEQ tablet Take 1 tablet (20 mEq total) by mouth daily. Patient not taking: Reported on 09/01/2014 12/14/13   Larey Dresser, MD    Physical Exam: Filed Vitals:   09/01/14 2230 09/01/14 2300 09/01/14 2330 09/02/14  0030  BP: 149/65 156/75 146/65 148/60  Pulse: 55 52 66   Temp:      TempSrc:      Resp: 19 16 17 16   SpO2: 93% 96% 94%     General: Alert, Awake and Oriented to Time, Place and Person. Appear in mild distress Eyes: PERRL ENT: Oral Mucosa clear moist. Neck: no JVD Cardiovascular: S1 and S2 Present, no Murmur, Peripheral Pulses Present Respiratory: Bilateral Air entry equal and Decreased,  Clear to Auscultation, no Crackles, no wheezes Abdomen: Bowel Sound present, Soft and non tender Skin: no Rash Extremities: no Pedal edema, no calf tenderness Neurologic: Grossly no focal neuro deficit.  Labs on Admission:  CBC:  Recent Labs Lab 09/01/14 1945  WBC 16.2*  NEUTROABS 13.1*  HGB 13.1  HCT 39.5  MCV 115.8*  PLT 469*    CMP     Component Value Date/Time   NA 130* 09/01/2014 1945   NA 131* 08/06/2011   K 3.7 09/01/2014 1945   CL 93* 09/01/2014 1945   CO2 28 09/01/2014 1945   GLUCOSE 99 09/01/2014 1945   BUN 29* 09/01/2014 1945   BUN 19 08/06/2011   CREATININE 1.20* 09/01/2014 1945   CREATININE 1.33* 02/08/2012 1624   CREATININE 0.7 08/06/2011   CALCIUM 10.0 09/01/2014 1945   PROT 6.2* 09/01/2014 1945   ALBUMIN 3.9 09/01/2014 1945   AST 22 09/01/2014 1945   ALT 20 09/01/2014 1945   ALKPHOS 59 09/01/2014 1945   BILITOT 0.5 09/01/2014 1945   GFRNONAA 41* 09/01/2014 1945   GFRAA 47*  09/01/2014 1945    No results for input(s): LIPASE, AMYLASE in the last 168 hours.   Recent Labs Lab 09/02/14 0037  TROPONINI <0.03   BNP (last 3 results)  Recent Labs  09/01/14 1946  BNP 502.4*    ProBNP (last 3 results)  Recent Labs  12/14/13 1430 07/19/14 1249 07/26/14 1342  PROBNP 225.0* 330.0* 289.0*     Radiological Exams on Admission: Dg Chest 2 View  09/01/2014   CLINICAL DATA:  Left lower chest pain beginning 4 hr ago. Personal history of atrial fibrillation and hypertension as well as smoking.  EXAM: CHEST  2 VIEW  COMPARISON:  12/06/2013.  Lumbar MRI 04/15/2014.  FINDINGS: There is chronic cardiomegaly. The aorta is unfolded. There is chronic linear scarring at both lung bases. There is chronic nodal calcification in the left hilum. There are newly seen compression fractures in the lumbar spine, not present on previous studies.  IMPRESSION: Chronic cardiomegaly. Chronic basilar pulmonary scarring. Chronic calcified hilar lymph nodes on the left.  Lumbar compression fractures, age indeterminate but not present on previous MRI dated 04/15/2014   Electronically Signed   By: Nelson Chimes M.D.   On: 09/01/2014 19:09   EKG: Independently reviewed. normal sinus rhythm, nonspecific ST and T waves changes.  Assessment/Plan Principal Problem:   Chest pain Active Problems:   Paroxysmal atrial fibrillation   Chronic diastolic CHF (congestive heart failure)   Polycythemia vera   Hypothyroidism   1. Chest pain The patient is presenting with numbness of chest pain. She is currently chest pain-free but her pain is intermittent and occurring on and off. With this the patient will be admitted in stepdown unit. We will follow serial troponin. Echogram in the morning. Patient remains nothing by mouth after midnight. She is on Xarelto is therefore possibility of PE is less likely.  2. Polycythemia vera. Continue home medication. H&H does not appear to be significantly  elevated.  3. Essential hypertension. Continue home medications.  4. Chronic diastolic dysfunction. Next and does not appear to be any significant volume overloaded. We will continue home Lasix dose.  5. Chronic pain syndrome. Continuing her home gabapentin as well as fentanyl patch. When necessary tramadol.  Advance goals of care discussion: DNR/DNI as per my discussion with patient   DVT Prophylaxis: chronic therapeutic anticoagulation. Nutrition: Nothing by mouth after midnight  Disposition: Admitted as observation, stepdown unit.  Author: Berle Mull, MD [facility] Pager: [phone] 09/02/2014  If 7PM-7AM, please contact night-coverage [email or web address] Password [email or web address]

## 2014-09-02 NOTE — Progress Notes (Signed)
Patient presented for [facility]. She tolerated stress test well.   Kingslee Mairena, PA-C

## 2014-09-02 NOTE — ED Notes (Signed)
Attempted report Xs 1.  

## 2014-09-02 NOTE — Progress Notes (Signed)
ANTICOAGULATION CONSULT NOTE - Initial Consult  Pharmacy Consult for Continuation of Xarelto Indication: atrial fibrillation  Allergies  Allergen Reactions  . Amlodipine Swelling    Lower extremity edema    Vital Signs: Temp: 98.9 F (37.2 C) (07/24 1738) Temp Source: Oral (07/24 1738) BP: 145/61 mmHg (07/24 2045) Pulse Rate: 83 (07/24 2045)  Labs:  Recent Labs  09/01/14 1945  HGB 13.1  HCT 39.5  PLT 469*  LABPROT 15.0  INR 1.17  CREATININE 1.20*     Medical History: Past Medical History  Diagnosis Date  . PAF (paroxysmal atrial fibrillation)     a. on amio (02/2012 mild obstruction on PFT's) and xarelto.  . Essential hypertension   . Mitral valve disorders   . Tricuspid valve disorders, specified as nonrheumatic   . Neck mass     right, w/u including MRI negative  . Hypothyroidism   . Macular degeneration     Left, s/p inj. tx  . Senile osteoporosis     Reclast in the past  . Alopecia, unspecified   . Pure hypercholesterolemia   . Chronic diastolic CHF (congestive heart failure)     a. 11/2013 Echo: EF 60-65%, no rwma, mild TR, PASP 51mmHg.  [name] Kitchen Headache(784.0)   . Backache, unspecified   . Polycythemia vera(238.4)   . Gouty arthropathy, unspecified   . Insomnia, unspecified   . Other malaise and fatigue   . Hyposmolality and/or hyponatremia   . Hyperpotassemia   . Edema   . Closed fracture of lower end of radius with ulna   . Hypercalcemia   . Deafness     Left, s/p multiple surgeries  . Abnormal stress test     a. 11/2011 Ex MV: EF 80%, small, partially reversible anteroapical defect->mild ischemia vs attenuation-->med Rx.    Assessment: 81 y/o F on chronic Xarelto therapy for afib, CBC good, mild bump in Scr, other labs as above. Last dose of Xarelto was 7/23.   Goal of Therapy:  Monitor platelets by anticoagulation protocol: Yes   Plan:  -Continue Xarelto 15 mg daily with supper -Minimum q72h CBC while inpatient -Monitor for  bleeding  Narda Bonds 09/02/2014,12:41 AM

## 2014-09-02 NOTE — ED Notes (Signed)
Attempted to give report however Megan RN informed RN that that unit does not take pt w/ active chest pain.  Hospitalist bedside, informed him, he will adjust request.

## 2014-09-02 NOTE — ED Notes (Signed)
Pt assisted to chair per request. Ambulatory with walker to restroom, steady gait noted.

## 2014-09-03 ENCOUNTER — Observation Stay (HOSPITAL_COMMUNITY): Payer: Medicare Other

## 2014-09-03 DIAGNOSIS — I10 Essential (primary) hypertension: Secondary | ICD-10-CM | POA: Diagnosis not present

## 2014-09-03 DIAGNOSIS — I5032 Chronic diastolic (congestive) heart failure: Secondary | ICD-10-CM | POA: Diagnosis not present

## 2014-09-03 DIAGNOSIS — R079 Chest pain, unspecified: Secondary | ICD-10-CM | POA: Insufficient documentation

## 2014-09-03 DIAGNOSIS — I480 Paroxysmal atrial fibrillation: Secondary | ICD-10-CM | POA: Diagnosis not present

## 2014-09-03 DIAGNOSIS — D45 Polycythemia vera: Secondary | ICD-10-CM | POA: Diagnosis not present

## 2014-09-03 DIAGNOSIS — R0789 Other chest pain: Secondary | ICD-10-CM | POA: Diagnosis not present

## 2014-09-03 DIAGNOSIS — E039 Hypothyroidism, unspecified: Secondary | ICD-10-CM | POA: Diagnosis not present

## 2014-09-03 LAB — URINE CULTURE

## 2014-09-03 MED ORDER — POTASSIUM CHLORIDE CRYS ER 20 MEQ PO TBCR
40.0000 meq | EXTENDED_RELEASE_TABLET | Freq: Once | ORAL | Status: AC
  Administered 2014-09-03: 40 meq via ORAL
  Filled 2014-09-03: qty 2

## 2014-09-03 NOTE — Progress Notes (Signed)
  Echocardiogram 2D Echocardiogram has been performed.  Sarah Mays 09/03/2014, 10:22 AM

## 2014-09-03 NOTE — Progress Notes (Signed)
SUBJECTIVE:  No chet pain  OBJECTIVE:   Vitals:   Filed Vitals:   09/03/14 0445 09/03/14 0447 09/03/14 0500 09/03/14 0801  BP: 135/91  134/63 163/71  Pulse: 37  61 60  Temp:  97.8 F (36.6 C)  97.9 F (36.6 C)  TempSrc:  Oral  Oral  Resp: 26  12 19   Height:      Weight:      SpO2: 97%  94% 96%   I&O's:   Intake/Output Summary (Last 24 hours) at 09/03/14 0855 Last data filed at 09/02/14 2116  Gross per 24 hour  Intake     75 ml  Output   1475 ml  Net  -1400 ml        PHYSICAL EXAM General: Well developed, well nourished, in no acute distress Head:   Normal cephalic and atramatic  Lungs:   Clear bilaterally to auscultation. Heart:   HRRR S1 S2  No JVD.   Abdomen: abdomen soft and non-tender Msk:  Back normal,  Normal strength and tone for age. Extremities:   Tr LE edema.   Neuro: Alert and oriented. Psych:  Normal affect, responds appropriately Skin: No rash   LABS: Basic Metabolic Panel:  Recent Labs  09/01/14 1945 09/02/14 0641  NA 130* 137  K 3.7 3.4*  CL 93* 101  CO2 28 28  GLUCOSE 99 90  BUN 29* 21*  CREATININE 1.20* 0.86  CALCIUM 10.0 9.9   Liver Function Tests:  Recent Labs  09/01/14 1945 09/02/14 0641  AST 22 20  ALT 20 17  ALKPHOS 59 52  BILITOT 0.5 0.9  PROT 6.2* 5.9*  ALBUMIN 3.9 3.8   No results for input(s): LIPASE, AMYLASE in the last 72 hours. CBC:  Recent Labs  09/01/14 1945 09/02/14 0641  WBC 16.2* 12.4*  NEUTROABS 13.1* 9.9*  HGB 13.1 13.0  HCT 39.5 39.1  MCV 115.8* 115.7*  PLT 469* 425*   Cardiac Enzymes:  Recent Labs  09/02/14 0037 09/02/14 0907 09/02/14 1844  TROPONINI <0.03 0.05* 0.03   BNP: Invalid input(s): POCBNP D-Dimer: No results for input(s): DDIMER in the last 72 hours. Hemoglobin A1C: No results for input(s): HGBA1C in the last 72 hours. Fasting Lipid Panel: No results for input(s): CHOL, HDL, LDLCALC, TRIG, CHOLHDL, LDLDIRECT in the last 72 hours. Thyroid Function Tests: No  results for input(s): TSH, T4TOTAL, T3FREE, THYROIDAB in the last 72 hours.  Invalid input(s): FREET3 Anemia Panel: No results for input(s): VITAMINB12, FOLATE, FERRITIN, TIBC, IRON, RETICCTPCT in the last 72 hours. Coag Panel:   Lab Results  Component Value Date   INR 1.17 09/01/2014   INR 2.2* 12/05/2013    RADIOLOGY: Dg Chest 2 View  09/01/2014   CLINICAL DATA:  Left lower chest pain beginning 4 hr ago. Personal history of atrial fibrillation and hypertension as well as smoking.  EXAM: CHEST  2 VIEW  COMPARISON:  12/06/2013.  Lumbar MRI 04/15/2014.  FINDINGS: There is chronic cardiomegaly. The aorta is unfolded. There is chronic linear scarring at both lung bases. There is chronic nodal calcification in the left hilum. There are newly seen compression fractures in the lumbar spine, not present on previous studies.  IMPRESSION: Chronic cardiomegaly. Chronic basilar pulmonary scarring. Chronic calcified hilar lymph nodes on the left.  Lumbar compression fractures, age indeterminate but not present on previous MRI dated 04/15/2014   Electronically Signed   By: Nelson Chimes M.D.   On: 09/01/2014 19:09   Nm Myocar Multi W/spect W/wall  Motion / Ef  09/02/2014    There was no ST segment deviation noted during stress.  No T wave inversion was noted during stress.  The study is normal.  The left ventricular ejection fraction is normal (55-65%).  Nuclear stress EF: 64%.   Normal study, no evidence for ischemia or infarction.       ASSESSMENT: Kathyrn Lass:  CP: Negative stress test.  Echo mentioned by Dr. Radford Pax.  THis could be done as an outpatient if she is otherwise ready to be discharged.    AFib: Xarelto for stroke prevention.  Bradycardia, noted, but no lightheadedness or sx of bradycardia.  Jettie Booze, MD  09/03/2014  8:55 AM

## 2014-09-03 NOTE — Discharge Summary (Signed)
Physician Discharge Summary  Sarah Mays MRN: [id number] DOB/AGE: February 01, 1931 81 y.o.  PCP: Hollace Kinnier, DO   Admit date: 09/01/2014 Discharge date: 09/03/2014  Discharge Diagnoses:     Principal Problem:   Chest pain Active Problems:   Paroxysmal atrial fibrillation   Chronic diastolic CHF (congestive heart failure)   Polycythemia vera   Hypothyroidism   Pain in the chest    Follow-up recommendations Follow-up with PCP in 3-5 days , including although additional recommended appointments as below Follow-up CBC, CMP in 3-5 days      Medication List    STOP taking these medications        potassium chloride SA 20 MEQ tablet  Commonly known as:  K-DUR,KLOR-CON      TAKE these medications        acetaminophen 650 MG CR tablet  Commonly known as:  TYLENOL  Take 650 mg by mouth 3 (three) times daily.     amiodarone 200 MG tablet  Commonly known as:  PACERONE  Take 1 tablet (200 mg total) by mouth daily.     Biotin 5000 MCG Tabs  Take 5,000 mcg by mouth daily.     cholecalciferol 1000 UNITS tablet  Commonly known as:  VITAMIN D  Take 2,000 Units by mouth daily.     fentaNYL 25 MCG/HR patch  Commonly known as:  DURAGESIC - dosed mcg/hr  Place 25 mcg onto the skin every 3 (three) days.     furosemide 40 MG tablet  Commonly known as:  LASIX  Take 80 mg by mouth 2 (two) times daily.     gabapentin 100 MG capsule  Commonly known as:  NEURONTIN  Take 1 capsule (100 mg total) by mouth at bedtime.     hydrALAZINE 50 MG tablet  Commonly known as:  APRESOLINE  Take 1.5 tablets (75 mg total) by mouth 3 (three) times daily.     hydroxyurea 500 MG capsule  Commonly known as:  HYDREA  TAKE 2 CAPSULES ON MONDAYS AND TAKE 1 CAPSULE ALL OTHER DAYS OF THE WEEK.     ibandronate 150 MG tablet  Commonly known as:  BONIVA  TAKE ONE TABLET ONCE A MONTH IN THE MORNING ON AN EMPTY STOMACH WITH WATER. REMAIN UPRIGHT.     levothyroxine 112 MCG tablet  Commonly known  as:  SYNTHROID, LEVOTHROID  TAKE 1 TABLET EACH DAY.     Magnesium 250 MG Tabs  Take 250 mg by mouth daily.     Melatonin 5 MG Caps  Take 5 mg by mouth at bedtime.     polyethylene glycol packet  Commonly known as:  MIRALAX / GLYCOLAX  Take 17 g by mouth daily as needed (constipation).     potassium chloride 10 MEQ tablet  Commonly known as:  K-DUR  Take 20 mEq by mouth daily.     PRESERVISION AREDS 2 Caps  Take 1 capsule by mouth 2 (two) times daily.     senna-docusate 8.6-50 MG per tablet  Commonly known as:  Senokot-S  Take 2 tablets by mouth 2 (two) times daily.     traMADol 50 MG tablet  Commonly known as:  ULTRAM  Take 1 tablet (50 mg total) by mouth every 6 (six) hours as needed.     XARELTO 15 MG Tabs tablet  Generic drug:  Rivaroxaban  TAKE 1 TABLET ONCE DAILY.         Discharge Condition: Stable    Disposition: 01-Home or Self Care  Consults:  Cardiology  Significant Diagnostic Studies:  Dg Chest 2 View  09/01/2014   CLINICAL DATA:  Left lower chest pain beginning 4 hr ago. Personal history of atrial fibrillation and hypertension as well as smoking.  EXAM: CHEST  2 VIEW  COMPARISON:  12/06/2013.  Lumbar MRI 04/15/2014.  FINDINGS: There is chronic cardiomegaly. The aorta is unfolded. There is chronic linear scarring at both lung bases. There is chronic nodal calcification in the left hilum. There are newly seen compression fractures in the lumbar spine, not present on previous studies.  IMPRESSION: Chronic cardiomegaly. Chronic basilar pulmonary scarring. Chronic calcified hilar lymph nodes on the left.  Lumbar compression fractures, age indeterminate but not present on previous MRI dated 04/15/2014   Electronically Signed   By: Nelson Chimes M.D.   On: 09/01/2014 19:09   Nm Myocar Multi W/spect W/wall Motion / Ef  09/02/2014    There was no ST segment deviation noted during stress.  No T wave inversion was noted during stress.  The study is normal.   The left ventricular ejection fraction is normal (55-65%).  Nuclear stress EF: 64%.   Normal study, no evidence for ischemia or infarction.      2-D echo pending    Filed Weights   09/02/14 0953  Weight: 52 kg (114 lb 10.2 oz)     Microbiology: Recent Results (from the past 240 hour(s))  Urine culture     Status: None (Preliminary result)   Collection Time: 09/01/14  8:53 PM  Result Value Ref Range Status   Specimen Description URINE, RANDOM  Final   Special Requests NONE  Final   Culture NO GROWTH < 12 HOURS  Final   Report Status PENDING  Incomplete  MRSA PCR Screening     Status: None   Collection Time: 09/02/14  9:53 AM  Result Value Ref Range Status   MRSA by PCR NEGATIVE NEGATIVE Final    Comment:        The GeneXpert MRSA Assay (FDA approved for NASAL specimens only), is one component of a comprehensive MRSA colonization surveillance program. It is not intended to diagnose MRSA infection nor to guide or monitor treatment for MRSA infections.        Blood Culture    Component Value Date/Time   SDES URINE, RANDOM 09/01/2014 2053   SPECREQUEST NONE 09/01/2014 2053   CULT NO GROWTH < 12 HOURS 09/01/2014 2053   REPTSTATUS PENDING 09/01/2014 2053      Labs: Results for orders placed or performed during the hospital encounter of 09/01/14 (from the past 48 hour(s))  I-stat troponin, ED (not at [facility], [facility])     Status: None   Collection Time: 09/01/14  7:42 PM  Result Value Ref Range   Troponin i, poc 0.03 0.00 - 0.08 ng/mL   Comment 3            Comment: Due to the release kinetics of cTnI, a negative result within the first hours of the onset of symptoms does not rule out myocardial infarction with certainty. If myocardial infarction is still suspected, repeat the test at appropriate intervals.   Comprehensive metabolic panel     Status: Abnormal   Collection Time: 09/01/14  7:45 PM  Result Value Ref Range   Sodium 130 (L) 135 - 145 mmol/L    Potassium 3.7 3.5 - 5.1 mmol/L   Chloride 93 (L) 101 - 111 mmol/L   CO2 28 22 - 32 mmol/L   Glucose, Bld  99 65 - 99 mg/dL   BUN 29 (H) 6 - 20 mg/dL   Creatinine, Ser 1.20 (H) 0.44 - 1.00 mg/dL   Calcium 10.0 8.9 - 10.3 mg/dL   Total Protein 6.2 (L) 6.5 - 8.1 g/dL   Albumin 3.9 3.5 - 5.0 g/dL   AST 22 15 - 41 U/L   ALT 20 14 - 54 U/L   Alkaline Phosphatase 59 38 - 126 U/L   Total Bilirubin 0.5 0.3 - 1.2 mg/dL   GFR calc non Af Amer 41 (L) >60 mL/min   GFR calc Af Amer 47 (L) >60 mL/min    Comment: (NOTE) The eGFR has been calculated using the CKD EPI equation. This calculation has not been validated in all clinical situations. eGFR's persistently <60 mL/min signify possible Chronic Kidney Disease.    Anion gap 9 5 - 15  CBC with Differential     Status: Abnormal   Collection Time: 09/01/14  7:45 PM  Result Value Ref Range   WBC 16.2 (H) 4.0 - 10.5 K/uL   RBC 3.41 (L) 3.87 - 5.11 MIL/uL   Hemoglobin 13.1 12.0 - 15.0 g/dL   HCT 39.5 36.0 - 46.0 %   MCV 115.8 (H) 78.0 - 100.0 fL   MCH 38.4 (H) 26.0 - 34.0 pg   MCHC 33.2 30.0 - 36.0 g/dL   RDW 16.5 (H) 11.5 - 15.5 %   Platelets 469 (H) 150 - 400 K/uL   Neutrophils Relative % 81 (H) 43 - 77 %   Lymphocytes Relative 8 (L) 12 - 46 %   Monocytes Relative 10 3 - 12 %   Eosinophils Relative 1 0 - 5 %   Basophils Relative 0 0 - 1 %   Neutro Abs 13.1 (H) 1.7 - 7.7 K/uL   Lymphs Abs 1.3 0.7 - 4.0 K/uL   Monocytes Absolute 1.6 (H) 0.1 - 1.0 K/uL   Eosinophils Absolute 0.2 0.0 - 0.7 K/uL   Basophils Absolute 0.0 0.0 - 0.1 K/uL   WBC Morphology ATYPICAL LYMPHOCYTES   Protime-INR     Status: None   Collection Time: 09/01/14  7:45 PM  Result Value Ref Range   Prothrombin Time 15.0 11.6 - 15.2 seconds   INR 1.17 0.00 - 1.49  Brain natriuretic peptide (order if patient c/o SOB ONLY)     Status: Abnormal   Collection Time: 09/01/14  7:46 PM  Result Value Ref Range   B Natriuretic Peptide 502.4 (H) 0.0 - 100.0 pg/mL  Urine culture      Status: None (Preliminary result)   Collection Time: 09/01/14  8:53 PM  Result Value Ref Range   Specimen Description URINE, RANDOM    Special Requests NONE    Culture NO GROWTH < 12 HOURS    Report Status PENDING   Urinalysis, Routine w reflex microscopic (not at [facility])     Status: None   Collection Time: 09/01/14  8:53 PM  Result Value Ref Range   Color, Urine YELLOW YELLOW   APPearance CLEAR CLEAR   Specific Gravity, Urine 1.010 1.005 - 1.030   pH 6.0 5.0 - 8.0   Glucose, UA NEGATIVE NEGATIVE mg/dL   Hgb urine dipstick NEGATIVE NEGATIVE   Bilirubin Urine NEGATIVE NEGATIVE   Ketones, ur NEGATIVE NEGATIVE mg/dL   Protein, ur NEGATIVE NEGATIVE mg/dL   Urobilinogen, UA 0.2 0.0 - 1.0 mg/dL   Nitrite NEGATIVE NEGATIVE   Leukocytes, UA NEGATIVE NEGATIVE    Comment: MICROSCOPIC NOT DONE ON URINES WITH  NEGATIVE PROTEIN, BLOOD, LEUKOCYTES, NITRITE, OR GLUCOSE <1000 mg/dL.  I-Stat Troponin, ED - 0, 3, 6 hours (not at [facility])     Status: None   Collection Time: 09/01/14  9:54 PM  Result Value Ref Range   Troponin i, poc 0.01 0.00 - 0.08 ng/mL   Comment 3            Comment: Due to the release kinetics of cTnI, a negative result within the first hours of the onset of symptoms does not rule out myocardial infarction with certainty. If myocardial infarction is still suspected, repeat the test at appropriate intervals.   I-Stat Troponin, ED - 0, 3, 6 hours (not at [facility])     Status: None   Collection Time: 09/02/14 12:16 AM  Result Value Ref Range   Troponin i, poc 0.02 0.00 - 0.08 ng/mL   Comment 3            Comment: Due to the release kinetics of cTnI, a negative result within the first hours of the onset of symptoms does not rule out myocardial infarction with certainty. If myocardial infarction is still suspected, repeat the test at appropriate intervals.   Troponin I (q 6hr x 3)     Status: None   Collection Time: 09/02/14 12:37 AM  Result Value Ref Range   Troponin I <0.03  <0.031 ng/mL    Comment:        NO INDICATION OF MYOCARDIAL INJURY.   CBC with Differential/Platelet     Status: Abnormal   Collection Time: 09/02/14  6:41 AM  Result Value Ref Range   WBC 12.4 (H) 4.0 - 10.5 K/uL   RBC 3.38 (L) 3.87 - 5.11 MIL/uL   Hemoglobin 13.0 12.0 - 15.0 g/dL   HCT 39.1 36.0 - 46.0 %   MCV 115.7 (H) 78.0 - 100.0 fL   MCH 38.5 (H) 26.0 - 34.0 pg   MCHC 33.2 30.0 - 36.0 g/dL   RDW 16.6 (H) 11.5 - 15.5 %   Platelets 425 (H) 150 - 400 K/uL   Neutrophils Relative % 79 (H) 43 - 77 %   Lymphocytes Relative 10 (L) 12 - 46 %   Monocytes Relative 10 3 - 12 %   Eosinophils Relative 1 0 - 5 %   Basophils Relative 0 0 - 1 %   Neutro Abs 9.9 (H) 1.7 - 7.7 K/uL   Lymphs Abs 1.2 0.7 - 4.0 K/uL   Monocytes Absolute 1.2 (H) 0.1 - 1.0 K/uL   Eosinophils Absolute 0.1 0.0 - 0.7 K/uL   Basophils Absolute 0.0 0.0 - 0.1 K/uL   RBC Morphology OVAL MACROCYTES     Comment: POLYCHROMASIA PRESENT   WBC Morphology VACUOLATED NEUTROPHILS   Comprehensive metabolic panel     Status: Abnormal   Collection Time: 09/02/14  6:41 AM  Result Value Ref Range   Sodium 137 135 - 145 mmol/L   Potassium 3.4 (L) 3.5 - 5.1 mmol/L   Chloride 101 101 - 111 mmol/L   CO2 28 22 - 32 mmol/L   Glucose, Bld 90 65 - 99 mg/dL   BUN 21 (H) 6 - 20 mg/dL   Creatinine, Ser 0.86 0.44 - 1.00 mg/dL   Calcium 9.9 8.9 - 10.3 mg/dL   Total Protein 5.9 (L) 6.5 - 8.1 g/dL   Albumin 3.8 3.5 - 5.0 g/dL   AST 20 15 - 41 U/L   ALT 17 14 - 54 U/L   Alkaline Phosphatase 52 38 -  126 U/L   Total Bilirubin 0.9 0.3 - 1.2 mg/dL   GFR calc non Af Amer >60 >60 mL/min   GFR calc Af Amer >60 >60 mL/min    Comment: (NOTE) The eGFR has been calculated using the CKD EPI equation. This calculation has not been validated in all clinical situations. eGFR's persistently <60 mL/min signify possible Chronic Kidney Disease.    Anion gap 8 5 - 15  Troponin I (q 6hr x 3)     Status: Abnormal   Collection Time: 09/02/14  9:07 AM   Result Value Ref Range   Troponin I 0.05 (H) <0.031 ng/mL    Comment:        PERSISTENTLY INCREASED TROPONIN VALUES IN THE RANGE OF 0.04-0.49 ng/mL CAN BE SEEN IN:       -UNSTABLE ANGINA       -CONGESTIVE HEART FAILURE       -MYOCARDITIS       -CHEST TRAUMA       -ARRYHTHMIAS       -LATE PRESENTING MYOCARDIAL INFARCTION       -COPD   CLINICAL FOLLOW-UP RECOMMENDED.   I-Stat Troponin, ED - 0, 3, 6 hours (not at [place])     Status: None   Collection Time: 09/02/14  9:13 AM  Result Value Ref Range   Troponin i, poc 0.03 0.00 - 0.08 ng/mL   Comment 3            Comment: Due to the release kinetics of cTnI, a negative result within the first hours of the onset of symptoms does not rule out myocardial infarction with certainty. If myocardial infarction is still suspected, repeat the test at appropriate intervals.   MRSA PCR Screening     Status: None   Collection Time: 09/02/14  9:53 AM  Result Value Ref Range   MRSA by PCR NEGATIVE NEGATIVE    Comment:        The GeneXpert MRSA Assay (FDA approved for NASAL specimens only), is one component of a comprehensive MRSA colonization surveillance program. It is not intended to diagnose MRSA infection nor to guide or monitor treatment for MRSA infections.   Troponin I (q 6hr x 3)     Status: None   Collection Time: 09/02/14  6:44 PM  Result Value Ref Range   Troponin I 0.03 <0.031 ng/mL    Comment:        NO INDICATION OF MYOCARDIAL INJURY.      Lipid Panel     No results found for: HGBA1C      HPI :81yo WF with a history of PAF on Xarelto, HTN, hypothyroidism, dyslipidemia and chronic diastolic CHF who was in her USOH until yesterday afternoon about 3:30pm when she started having CP. She was sitting resting at the time of onset. She is unable to describe the nature of the pain but it was severe. She did not try taking anything for it. She told the hospitalist that she had been having this pain off and on for the  past few days lasting a few minutes at a time but told me the pain was new yesterday. The pain waxed and waned yesterday and into the evening and she presented to the ER. She currently is pain free. She denied any SOB, diaphoresis, nausea with the discomfort. She does have chronic LE edema which has increased recently but no SOB. Her Troponin is negative x 3. Cardiology is  asked to consult for further evaluation of CP  HOSPITAL COURSE:  1. Chest pain of unclear etiology. Lexi scan Myoview was negative, no suspicion for PE as the patient is anticoagulated, Her EKG is nonischemic.   Cardiac enzymes are negative. Stress myoview showed small partially reversible anteroapical perfusion defect c/w mild ischemia vs. Attenuation 12/2011.   2D echo pending and will be completed prior to discharge.  2. PAF maintaining NSR with PAC's on Amio and Xarelto 3. Chronic diastolic CHF - appears euvolemic on exam today and chest xray with no edema. Continue Lasix. 4. HTN - controlled 5. Chronic pain on fentanyl patch will be continued   Discharge Exam: *   Blood pressure 163/71, pulse 60, temperature 97.9 F (36.6 C), temperature source Oral, resp. rate 19, height 5' 1"  (1.549 m), weight 52 kg (114 lb 10.2 oz), SpO2 96 %.  General: Well developed, well nourished, in no acute distress Head: Eyes PERRLA, No xanthomas. Normal cephalic and atramatic Lungs: Clear bilaterally to auscultation and percussion. Heart: HRRR S1 S2 Pulses are 2+ & equal.  No carotid bruit. No JVD. No abdominal bruits. No femoral bruits. Abdomen: Bowel sounds are positive, abdomen soft and non-tender without masses  Extremities: No clubbing, cyanosis or edema. DP +1 Neuro: Alert and oriented X 3. Psych: Good affect, responds appropriately       Discharge Instructions    Diet - low sodium heart healthy    Complete by:  As directed      Diet - low sodium heart healthy    Complete by:  As  directed      Discharge instructions    Complete by:  As directed   Please follow up with Primary care doctor in 1-2 weeks     Increase activity slowly    Complete by:  As directed      Increase activity slowly    Complete by:  As directed            Follow-up Information    Follow up with REED, TIFFANY, DO.   Specialty:  Geriatric Medicine   Contact information:   [address] [place] [address] [phone]       Signed: Reyne Dumas 09/03/2014, 8:49 AM        Time spent >45 mins

## 2014-09-03 NOTE — Progress Notes (Signed)
   09/03/14 0445  Vitals  BP (!) 135/91 mmHg  MAP (mmHg) 98  Pulse Rate (!) 37  ECG Heart Rate 63  Resp (!) 26  Oxygen Therapy  SpO2 97 %  Pt consistently running SB, EKG was performed and pt discussed with Central telemetry.  Pt asymptomatic at present. [facility] was notified via Amnion concerning pt Heart Rate and Rhythm.  No response from them as yet.  Will continue to monitor pt.

## 2014-09-03 NOTE — Evaluation (Signed)
Physical Therapy Evaluation/ Discharge Patient Details Name: Sarah Mays MRN: [id number] DOB: [birth year] Today's Date: 09/03/2014   History of Present Illness  Sarah Mays is a 81 y.o. female with Past medical history of atrial fibrillation on anticoagulation, essential hypertension, hypothyroidism, polycythemia vera. Pt admitted with chest pain and bradycardia  Clinical Impression  Sarah Mays is very pleasant, moving well without CP, SOB, dizziness or LOB throughout evaluation. Pt lives in independent living at [place] with spouse in ALF. Pt uses a RW at baseline and currently able to perform all basic mobility with assist of RW without difficulty. No further therapy needs at this time, pt aware and agreeable.     Follow Up Recommendations No PT follow up    Equipment Recommendations  None recommended by PT    Recommendations for Other Services       Precautions / Restrictions Precautions Precautions: None      Mobility  Bed Mobility               General bed mobility comments: in chair before and after session  Transfers Overall transfer level: Independent                  Ambulation/Gait Ambulation/Gait assistance: Modified independent (Device/Increase time) Ambulation Distance (Feet): 450 Feet Assistive device: Rolling walker (2 wheeled) Gait Pattern/deviations: Step-through pattern;WFL(Within Functional Limits)   Gait velocity interpretation: at or above normal speed for age/gender    Stairs            Wheelchair Mobility    Modified Rankin (Stroke Patients Only)       Balance Overall balance assessment: Needs assistance   Sitting balance-Leahy Scale: Good       Standing balance-Leahy Scale: Fair                               Pertinent Vitals/Pain Pain Assessment: No/denies pain  HR 76-90 with gait sats 92-96% on RA    Home Living Family/patient expects to be discharged to:: Private  residence Living Arrangements: Alone   Type of Home: Independent living facility       Home Layout: One level Home Equipment: Environmental consultant - 2 wheels;Toilet riser      Prior Function Level of Independence: Independent with assistive device(s)         Comments: pt has been using RW for a few weeks due to back pain     Hand Dominance        Extremity/Trunk Assessment   Upper Extremity Assessment: Overall WFL for tasks assessed           Lower Extremity Assessment: Overall WFL for tasks assessed      Cervical / Trunk Assessment: Normal  Communication   Communication: No difficulties  Cognition Arousal/Alertness: Awake/alert Behavior During Therapy: WFL for tasks assessed/performed Overall Cognitive Status: Within Functional Limits for tasks assessed                      General Comments      Exercises        Assessment/Plan    PT Assessment Patent does not need any further PT services  PT Diagnosis Difficulty walking   PT Problem List    PT Treatment Interventions     PT Goals (Current goals can be found in the Care Plan section) Acute Rehab PT Goals PT Goal Formulation: All assessment and education complete, DC therapy  Frequency     Barriers to discharge        Co-evaluation               End of Session   Activity Tolerance: Patient tolerated treatment well Patient left: in chair;with call bell/phone within reach      Functional Assessment Tool Used: clinical judgement Functional Limitation: Mobility: Walking and moving around Mobility: Walking and Moving Around Current Status (O9629): At least 1 percent but less than 20 percent impaired, limited or restricted Mobility: Walking and Moving Around Goal Status [phone]): At least 1 percent but less than 20 percent impaired, limited or restricted Mobility: Walking and Moving Around Discharge Status [phone]): At least 1 percent but less than 20 percent impaired, limited or restricted     Time: 1140-1155 PT Time Calculation (min) (ACUTE ONLY): 15 min   Charges:   PT Evaluation $Initial PT Evaluation Tier I: 1 Procedure     PT G Codes:   PT G-Codes **NOT FOR INPATIENT CLASS** Functional Assessment Tool Used: clinical judgement Functional Limitation: Mobility: Walking and moving around Mobility: Walking and Moving Around Current Status (N0272): At least 1 percent but less than 20 percent impaired, limited or restricted Mobility: Walking and Moving Around Goal Status [phone]): At least 1 percent but less than 20 percent impaired, limited or restricted Mobility: Walking and Moving Around Discharge Status [phone]): At least 1 percent but less than 20 percent impaired, limited or restricted    Melford Aase 09/03/2014, 12:00 PM Elwyn Reach, [place]

## 2014-09-06 DIAGNOSIS — H3532 Exudative age-related macular degeneration: Secondary | ICD-10-CM | POA: Diagnosis not present

## 2014-09-06 DIAGNOSIS — H02831 Dermatochalasis of right upper eyelid: Secondary | ICD-10-CM | POA: Diagnosis not present

## 2014-09-06 DIAGNOSIS — H02834 Dermatochalasis of left upper eyelid: Secondary | ICD-10-CM | POA: Diagnosis not present

## 2014-09-06 DIAGNOSIS — Z961 Presence of intraocular lens: Secondary | ICD-10-CM | POA: Diagnosis not present

## 2014-09-06 DIAGNOSIS — H3531 Nonexudative age-related macular degeneration: Secondary | ICD-10-CM | POA: Diagnosis not present

## 2014-09-07 LAB — CULTURE, BLOOD (ROUTINE X 2)
CULTURE: NO GROWTH
Culture: NO GROWTH

## 2014-09-09 ENCOUNTER — Other Ambulatory Visit: Payer: Self-pay | Admitting: Cardiology

## 2014-09-11 ENCOUNTER — Encounter: Payer: Self-pay | Admitting: Internal Medicine

## 2014-09-11 ENCOUNTER — Non-Acute Institutional Stay: Payer: Medicare Other | Admitting: Internal Medicine

## 2014-09-11 VITALS — BP 92/60 | HR 63 | Temp 97.5°F | Ht 61.0 in | Wt 116.0 lb

## 2014-09-11 DIAGNOSIS — I952 Hypotension due to drugs: Secondary | ICD-10-CM

## 2014-09-11 DIAGNOSIS — M5440 Lumbago with sciatica, unspecified side: Secondary | ICD-10-CM | POA: Diagnosis not present

## 2014-09-11 DIAGNOSIS — I5032 Chronic diastolic (congestive) heart failure: Secondary | ICD-10-CM | POA: Diagnosis not present

## 2014-09-11 DIAGNOSIS — Z7952 Long term (current) use of systemic steroids: Secondary | ICD-10-CM | POA: Diagnosis not present

## 2014-09-11 DIAGNOSIS — I480 Paroxysmal atrial fibrillation: Secondary | ICD-10-CM

## 2014-09-11 DIAGNOSIS — K5901 Slow transit constipation: Secondary | ICD-10-CM | POA: Diagnosis not present

## 2014-09-11 DIAGNOSIS — R079 Chest pain, unspecified: Secondary | ICD-10-CM

## 2014-09-11 NOTE — Progress Notes (Signed)
Patient ID: Sarah Mays, female   DOB: 25-Jun-1930, 81 y.o.   MRN: [id number]   Location:  [facility]  Code Status: DNR  Goals of Care:Advanced Directive information Does patient have an advance directive?: Yes, Type of Advance Directive: [place];Living will;Out of facility DNR (pink MOST or yellow form), Pre-existing out of facility DNR order (yellow form or pink MOST form): Yellow form placed in chart (order not valid for inpatient use), Does patient want to make changes to advanced directive?: No - Patient declined  Chief Complaint  Patient presents with  . Hospitalization Follow-up    Follow-up from hospital visit on 09/01/14 (chest pain)     HPI: Patient is a 81 y.o. white female seen in the [facility] today for a hospital follow up from admission 7/24 with chest pain.  She was discharged home on 7/26.   On Sunday afternoon, she had been for dinner in the dining room, went home and changed clothes, relaxing in chair, and got chest pin just beneath her left breast.  Knows about her afib, but she's never had any chest pain.  Called and Juliann Pulse, RN, came to evaluate her and then she was sent to the ED.  Her chest pain waxed and waned throughout that evening while there.  Her EKG and cardiac enzymes were unremarkable.  She had a negative lexiscan myoview, but stress myoview in 11/13 showed small partially reversible anteroapical perfusion defect consistent with mild ischemia vs attenuation.  2D echo also was reviewed and showed EF 60-65%, mild mitral regurgitation, mild atrial dilatation, mildly increased pulmonary systolic pressure on 7/85/88.  Only change made to her meds was to stop her potassium; however, she was told not to take anymore hydralazine the day she was discharged b/c her bp was low.    Testing turned out well.  No recurrences of the pain and she doesn't want anymore.    BPs have been running consistently less than 120s when she's been  getting it checked in the clinic.  This am, bp lower than it usually is.  90 systolic today.  Is on hydralazine 75mg  po tid at present and her lasix 40mg  daily.  She has already taken the first hydralazine and her lasix.  She does not feel dizzy.    Had her lumbar ablation two weeks ago as of tomorrow.  Cannot tell a difference.  Can take 2-3 weeks for it to be effective, she was told.  Fentanyl doesn't work fabulously either.  Has 3 more patches remaining.  Has been cutting back on her tramadol to only late in the evening.  Using tylenol three times a day.  Right neck just posteroinferior to her ear mass was biopsied in past (Dr. Theola Sequin was negative for malignancy many years ago.  She notes she also has one on her right side.    Has spot on her thigh she never noticed before her hospitalization.  Is pencil eraser sized purple place mid thigh.  Nonpainful.    Takes senokot 2 twice a day with pain medication.  Gabapentin:  Had been put on it by Dr. Hulan Saas.  Taking 200mg  instead of 100mg  and this has been helpful.  Review of Systems:  Review of Systems  Constitutional: Negative for fever, chills and malaise/fatigue.  HENT: Positive for hearing loss.   Eyes: Negative for blurred vision.  Respiratory: Negative for cough and shortness of breath.   Cardiovascular: Negative for chest pain, palpitations and leg swelling.  No chest pain since hospitalization  Gastrointestinal: Negative for abdominal pain, constipation, blood in stool and melena.  Genitourinary: Negative for dysuria, urgency and frequency.  Musculoskeletal: Positive for back pain. Negative for joint pain and falls.  Skin: Negative for rash.  Neurological: Negative for dizziness, loss of consciousness and weakness.  Psychiatric/Behavioral: Negative for depression and memory loss. The patient is nervous/anxious.     Past Medical History  Diagnosis Date  . PAF (paroxysmal atrial fibrillation)     a. on amio  (02/2012 mild obstruction on PFT's) and xarelto.  . Essential hypertension   . Mitral valve disorders   . Tricuspid valve disorders, specified as nonrheumatic   . Neck mass     right, w/u including MRI negative  . Hypothyroidism   . Macular degeneration     Left, s/p inj. tx  . Senile osteoporosis     Reclast in the past  . Alopecia, unspecified   . Pure hypercholesterolemia   . Chronic diastolic CHF (congestive heart failure)     a. 11/2013 Echo: EF 60-65%, no rwma, mild TR, PASP 24mmHg.  [name] Kitchen Headache(784.0)   . Backache, unspecified   . Polycythemia vera(238.4)   . Gouty arthropathy, unspecified   . Insomnia, unspecified   . Other malaise and fatigue   . Hyposmolality and/or hyponatremia   . Hyperpotassemia   . Edema   . Closed fracture of lower end of radius with ulna   . Hypercalcemia   . Deafness     Left, s/p multiple surgeries  . Abnormal stress test     a. 11/2011 Ex MV: EF 80%, small, partially reversible anteroapical defect->mild ischemia vs attenuation-->med Rx.    Past Surgical History  Procedure Laterality Date  . Breast biopsy  06/18/1998    left  . Total abdominal hysterectomy    . Tonsillectomy    . Breast biopsy    . Tympanoplasty Bilateral   . Ablation  08/28/14    Back     Social History:   reports that she quit smoking about 30 years ago. Her smoking use included Cigarettes. She has never used smokeless tobacco. She reports that she drinks about 0.6 oz of alcohol per week. She reports that she does not use illicit drugs.  Allergies  Allergen Reactions  . Amlodipine Swelling    Lower extremity edema     Medications: Patient's Medications  New Prescriptions   No medications on file  Previous Medications   ACETAMINOPHEN (TYLENOL) 650 MG CR TABLET    Take 650 mg by mouth 3 (three) times daily.   AMIODARONE (PACERONE) 200 MG TABLET    TAKE 1 TABLET ONCE DAILY.   BIOTIN 5000 MCG TABS    Take 5,000 mcg by mouth daily.   CHOLECALCIFEROL (VITAMIN D)  1000 UNITS TABLET    Take 2,000 Units by mouth daily.    FENTANYL (DURAGESIC - DOSED MCG/HR) 25 MCG/HR PATCH    Place 25 mcg onto the skin every 3 (three) days.   FUROSEMIDE (LASIX) 40 MG TABLET    Take 80 mg by mouth 2 (two) times daily.   GABAPENTIN (NEURONTIN) 100 MG CAPSULE    Take 1 capsule (100 mg total) by mouth at bedtime.   HYDRALAZINE (APRESOLINE) 50 MG TABLET    Take 1.5 tablets (75 mg total) by mouth 3 (three) times daily.   HYDROXYUREA (HYDREA) 500 MG CAPSULE    TAKE 2 CAPSULES ON MONDAYS AND TAKE 1 CAPSULE ALL OTHER DAYS OF THE WEEK.  IBANDRONATE (BONIVA) 150 MG TABLET    TAKE ONE TABLET ONCE A MONTH IN THE MORNING ON AN EMPTY STOMACH WITH WATER. REMAIN UPRIGHT.   LEVOTHYROXINE (SYNTHROID, LEVOTHROID) 112 MCG TABLET    TAKE 1 TABLET EACH DAY.   MAGNESIUM 250 MG TABS    Take 250 mg by mouth daily.    MELATONIN 5 MG CAPS    Take 5 mg by mouth at bedtime.   MULTIPLE VITAMINS-MINERALS (PRESERVISION AREDS 2) CAPS    Take 1 capsule by mouth 2 (two) times daily.   POLYETHYLENE GLYCOL (MIRALAX / GLYCOLAX) PACKET    Take 17 g by mouth daily as needed (constipation).    POTASSIUM CHLORIDE (K-DUR) 10 MEQ TABLET    Take 20 mEq by mouth daily.   SENNA-DOCUSATE (SENOKOT-S) 8.6-50 MG PER TABLET    Take 2 tablets by mouth 2 (two) times daily.   TRAMADOL (ULTRAM) 50 MG TABLET    Take 1 tablet (50 mg total) by mouth every 6 (six) hours as needed.   XARELTO 15 MG TABS TABLET    TAKE 1 TABLET ONCE DAILY.  Modified Medications   No medications on file  Discontinued Medications   No medications on file     Physical Exam: Filed Vitals:   09/11/14 0902  BP: 92/60  Pulse: 63  Temp: 97.5 F (36.4 C)  TempSrc: Oral  Height: 5\' 1"  (1.549 m)  Weight: 116 lb (52.617 kg)  SpO2: 95%   Body mass index is 21.93 kg/(m^2). Physical Exam  Constitutional: She is oriented to person, place, and time. She appears well-developed and well-nourished. No distress.  Cardiovascular: Intact distal pulses.     irreg irreg  Pulmonary/Chest: Effort normal and breath sounds normal. She has no wheezes. She has no rales.  Abdominal: Soft. Bowel sounds are normal. She exhibits no distension. There is no tenderness.  Musculoskeletal: Normal range of motion. She exhibits no edema or tenderness.  Walks with rollator walker; no reproducible tenderness in her lumbar spine  Neurological: She is alert and oriented to person, place, and time.  Skin: Skin is warm and dry.  Psychiatric: She has a normal mood and affect.     Labs reviewed: Basic Metabolic Panel:  Recent Labs  11/19/13 1445  05/15/14 1540  08/09/14 1007 08/15/14 1651 09/01/14 1945 09/02/14 0641  NA 133*  < >  --   < > 134*  --  130* 137  K 3.6  < >  --   < > 4.0  --  3.7 3.4*  CL 98  < >  --   < > 93*  --  93* 101  CO2 23  < >  --   < > 34*  --  28 28  GLUCOSE 89  < >  --   < > 65*  --  99 90  BUN 26*  < >  --   < > 20  --  29* 21*  CREATININE 1.1  < >  --   < > 0.94  --  1.20* 0.86  CALCIUM 10.4  < >  --   < > 10.8*  --  10.0 9.9  TSH 0.87  --  1.10  --   --  1.14  --   --   < > = values in this interval not displayed. Liver Function Tests:  Recent Labs  08/15/14 1651 09/01/14 1945 09/02/14 0641  AST 17 22 20   ALT 12 20 17   ALKPHOS 62 59 52  BILITOT 0.6 0.5 0.9  PROT 6.3 6.2* 5.9*  ALBUMIN 4.0 3.9 3.8   No results for input(s): LIPASE, AMYLASE in the last 8760 hours. No results for input(s): AMMONIA in the last 8760 hours. CBC:  Recent Labs  05/09/14 1302 08/15/14 1651 09/01/14 1945 09/02/14 0641  WBC 8.1 13.8* 16.2* 12.4*  NEUTROABS 6.1  --  13.1* 9.9*  HGB 12.5 12.8 13.1 13.0  HCT 38.4 38.5 39.5 39.1  MCV 111.9* 113.7 Repeated and verified X2.* 115.8* 115.7*  PLT 349 469.0* 469* 425*   Lipid Panel: No results for input(s): CHOL, HDL, LDLCALC, TRIG, CHOLHDL, LDLDIRECT in the last 8760 hours. No results found for: HGBA1C  Procedures since last appt:  Patient Care Team: Gayland Curry, DO as PCP -  General (Geriatric Medicine) [facility] Hurman Horn, MD as Consulting Physician (Ophthalmology) Larey Dresser, MD as Consulting Physician (Cardiology) Heath Lark, MD as Consulting Physician (Hematology and Oncology) Heath Lark, MD as Consulting Physician (Hematology and Oncology) Clent Jacks, MD as Consulting Physician (Ophthalmology) Jerrell Belfast, MD as Consulting Physician (Otolaryngology)  Assessment/Plan 1. Chest pain, unspecified chest pain type -resolved; no clear etiology identified--all cardiac studies reviewed and unremarkable -has f/u with cardiology  2. Hypotension due to drugs -has had low bp since hospitalization for some reason -had to hold medication the day she was sent home -has gone back to taking 75mg  hydralazine tid and bp this am was 92/60 here at the clinic -advised to decrease to 25mg  po tid  -cont to see [name] in the [facility] clinic for daily bp checks -goal bp 076-226 systolic -if bps are still very low, she may need to stop her medication -cause for change is unclear b/c no other new meds were added to her regimen (only kcl was stopped)  3. Midline low back pain with sciatica, sciatica laterality unspecified -ongoing, so far has not gotten relief from the lumbar ablation -continues with fentanyl patch every three days and notes that she has only 3 left as of today so will need the new Rx next week -also continues on tramadol at night and three tylenol per day -if she is still having significant pain by next visit, will try increasing the fentanyl to see if she can get around the clock relief w/o additional orals -ok to increase gabapentin to 200mg  qhs instead of 100mg  -would could also increase this more if needed and it would be safer than more fentanyl  4. Slow transit constipation -cont senokot-s 2 bid for her constipation while taking narcotics - also has miralax as needed, but has not had to use it  5. Long term  current use of systemic steroids -she has now tapered off the steroids and doing well though she still has the chronic pain, but no longer having edema/anasarca  6. Chronic diastolic CHF (congestive heart failure) -last echo was normal ef and very minimal valvular disease -cont lasix 40mg  daily and kcl just 52meq daily now -not on beta blocker or ace but bp too soft at present  7. Paroxysmal atrial fibrillation -cont xarelto for anticoagulation and amiodarone for rhythm control though still seems to be in afib  today  Next appt:  Keep scheduled appt  Jeanifer Halliday L. Nannie Starzyk, D.O. [facility] [address], [place] [address] Cell Phone (Mon-Fri 8am-5pm):  [phone] On Call:  [phone] & follow prompts after 5pm & weekends Office Phone:  [phone] Office Fax:  [phone]

## 2014-09-13 DIAGNOSIS — M47816 Spondylosis without myelopathy or radiculopathy, lumbar region: Secondary | ICD-10-CM | POA: Diagnosis not present

## 2014-09-18 ENCOUNTER — Other Ambulatory Visit: Payer: Self-pay

## 2014-09-18 ENCOUNTER — Telehealth: Payer: Self-pay | Admitting: Cardiology

## 2014-09-18 DIAGNOSIS — R609 Edema, unspecified: Secondary | ICD-10-CM

## 2014-09-18 MED ORDER — FENTANYL 25 MCG/HR TD PT72: 25.0000 ug | MEDICATED_PATCH | TRANSDERMAL | Status: DC

## 2014-09-18 NOTE — Telephone Encounter (Signed)
Agree, take Lasix 80 mg bid x 4 days then back to 60 mg bid.  Elevate legs.  BMET next week.

## 2014-09-18 NOTE — Telephone Encounter (Signed)
New message      Pt c/o swelling: STAT is pt has developed SOB within 24 hours  1. How long have you been experiencing swelling? Since sunday 2. Where is the swelling located? Feet/ankles 3.  Are you currently taking a "fluid pill"? yes  4.  Are you currently SOB?  no 5.  Have you traveled recently? no

## 2014-09-18 NOTE — Telephone Encounter (Signed)
Pt called back.  Pt confirmed she has had swelling since Sunday and been wearing her support stocking since Sunday. Pt stated that yesterday afternoon she started taking an additional half of tablet (80 mg) yesterday and did the same this morning. Pt wants to know how long she should continue this routine.  Reviewed with DOD, pt to continue for 4 days taking 80 mg BID and come in for BMET next week.  Pt made aware of orders and to take 80 mg BID and return to normal doses on Saturday. Pt coming into our office on 8/17 for follow up BMET.

## 2014-09-18 NOTE — Telephone Encounter (Signed)
Call from [facility] clinic nurse- patient had called her asking for refill on Fentanyl Patch. Written Rx given to [facility] to call patient and give her Rx.

## 2014-09-18 NOTE — Telephone Encounter (Signed)
Tried to call pt back no answer, and unable to leave message.   Called [facility] Nurse, she stated she had seen pt that morning with no c/o of swelling but would contact pt and have her call us back [phone]

## 2014-09-19 ENCOUNTER — Other Ambulatory Visit: Payer: Self-pay | Admitting: Cardiology

## 2014-09-20 ENCOUNTER — Telehealth: Payer: Self-pay | Admitting: Cardiology

## 2014-09-20 NOTE — Telephone Encounter (Signed)
New message      Pt talked to a nurse earlier this week regarding her feet swelling. She says they are still swollen. Please advise

## 2014-09-20 NOTE — Telephone Encounter (Signed)
Spoke with pt she is still taking medication as order on Wed 80 BID for 4 days, with last dose on Saturday. Pt stated she has been wearing her compression stocking everyday throughout the day and keeping her feet elevated. Pt has not noticed much change in her feet since adjusting the dose of Lasix.  Pt instructed to continue with current plan and and give it more time. Pt expressed understanding and knows to call our office or her on call facility nurse is she starts to have any problems over he weekend.  Pt BP today 130/68.  Pt has no additional questions at this time.

## 2014-09-22 NOTE — Telephone Encounter (Signed)
As long as she is not short of breath or feeling bad, some lower leg edema is not that worrisome.  Would not increase Lasix any further if she is asymptomatic.

## 2014-09-23 ENCOUNTER — Telehealth: Payer: Self-pay | Admitting: Cardiology

## 2014-09-23 ENCOUNTER — Other Ambulatory Visit: Payer: Self-pay | Admitting: Hematology and Oncology

## 2014-09-23 NOTE — Telephone Encounter (Signed)
Pt aware of Dr Claris Gladden recommendations regarding lasix dose.  Pt states when she saw Dr Mariea Clonts, PCP, 09/11/14 Dr Mariea Clonts advised pt to decrease her hydralazine dose to 1/2 of a 50mg  tablet three times a day.

## 2014-09-23 NOTE — Telephone Encounter (Signed)
Left message to call back  

## 2014-09-23 NOTE — Telephone Encounter (Signed)
New message ° ° ° ° °Pt returning your call °

## 2014-09-23 NOTE — Telephone Encounter (Signed)
Pt states her BP 09/20/14 was 130/68.  Pt states her LE edema has improved some , she denies shortness of breath.

## 2014-09-23 NOTE — Telephone Encounter (Signed)
Pt states she decreased her furosemide dose back to her usual dose of  1 and 1/2 of a 40mg  tablet  two times a day on Saturday.

## 2014-09-23 NOTE — Telephone Encounter (Signed)
Pt states her BP was 92/62 at that office visit 09/11/14.   Pt states her BP this AM was 112/60, pt denies lightheadedness or dizziness.

## 2014-09-23 NOTE — Telephone Encounter (Signed)
See phone note 09/20/14.

## 2014-09-24 ENCOUNTER — Other Ambulatory Visit: Payer: Self-pay | Admitting: Internal Medicine

## 2014-09-24 ENCOUNTER — Other Ambulatory Visit: Payer: Self-pay | Admitting: *Deleted

## 2014-09-24 MED ORDER — GABAPENTIN 100 MG PO CAPS: ORAL_CAPSULE | ORAL | Status: DC

## 2014-09-24 NOTE — Telephone Encounter (Signed)
Refill done.  

## 2014-09-25 ENCOUNTER — Other Ambulatory Visit: Payer: Self-pay | Admitting: *Deleted

## 2014-09-25 ENCOUNTER — Other Ambulatory Visit (INDEPENDENT_AMBULATORY_CARE_PROVIDER_SITE_OTHER): Payer: Medicare Other | Admitting: *Deleted

## 2014-09-25 DIAGNOSIS — R609 Edema, unspecified: Secondary | ICD-10-CM | POA: Diagnosis not present

## 2014-09-25 LAB — BASIC METABOLIC PANEL
BUN: 16 mg/dL (ref 6–23)
CHLORIDE: 98 meq/L (ref 96–112)
CO2: 31 meq/L (ref 19–32)
Calcium: 10.5 mg/dL (ref 8.4–10.5)
Creatinine, Ser: 0.83 mg/dL (ref 0.40–1.20)
GFR: 69.93 mL/min (ref >60.00)
GLUCOSE: 70 mg/dL (ref 70–99)
Potassium: 4.1 mEq/L (ref 3.5–5.1)
Sodium: 136 mEq/L (ref 135–145)

## 2014-09-25 MED ORDER — HYDROXYUREA 500 MG PO CAPS: ORAL_CAPSULE | ORAL | Status: DC

## 2014-09-26 ENCOUNTER — Telehealth: Payer: Self-pay

## 2014-09-26 NOTE — Telephone Encounter (Signed)
spoke with pt about lab results. pt verbalized understanding and to conitnue on current treatment plan.

## 2014-09-30 DIAGNOSIS — H3531 Nonexudative age-related macular degeneration: Secondary | ICD-10-CM | POA: Diagnosis not present

## 2014-09-30 DIAGNOSIS — H35051 Retinal neovascularization, unspecified, right eye: Secondary | ICD-10-CM | POA: Diagnosis not present

## 2014-09-30 DIAGNOSIS — H3532 Exudative age-related macular degeneration: Secondary | ICD-10-CM | POA: Diagnosis not present

## 2014-10-08 ENCOUNTER — Encounter: Payer: Self-pay | Admitting: Physician Assistant

## 2014-10-08 ENCOUNTER — Ambulatory Visit (INDEPENDENT_AMBULATORY_CARE_PROVIDER_SITE_OTHER): Payer: Medicare Other | Admitting: Physician Assistant

## 2014-10-08 VITALS — BP 148/68 | HR 77 | Ht 60.0 in | Wt 118.0 lb

## 2014-10-08 DIAGNOSIS — I5032 Chronic diastolic (congestive) heart failure: Secondary | ICD-10-CM | POA: Diagnosis not present

## 2014-10-08 DIAGNOSIS — I2510 Atherosclerotic heart disease of native coronary artery without angina pectoris: Secondary | ICD-10-CM

## 2014-10-08 NOTE — Progress Notes (Signed)
Cardiology Office Note   Date:  10/08/2014   ID:  Sarah Mays, DOB 1930-04-27, MRN [id number]  PCP:  Hollace Kinnier, DO  Cardiologist:  Dr. Waynetta Pean, PA-C   Chief Complaint: Post hospital follow-up  History of Present Illness: Sarah Mays is a 81 y.o. female with a history of paroxysmal atrial fibrillation on and Xarelto and amiodarone, hypertension, chronic diastolic CHF and recent admission for chest pain with a negative stress test.  Sarah Mays presents for post hospital evaluation.   Since discharge she has done very well. She never gets palpitations with her atrial fibrillation but has not had any episodes of lightheadedness or weakness. Her respiratory status is at baseline and she has gained a couple of pounds, but feels that she is doing well from a respiratory standpoint. She has not had any additional episodes of chest pain.  She went to the rehabilitation area at [name] after discharge, but was released from there back to her independent living apartment. She has done well. She is not exercising but that is because of back problems. She denies orthopnea or PND. She has mild lower extremity daytime edema. She has had no additional chest pain.  At one point, her blood pressure was low and her medications were adjusted. She was formerly on hydralazine 50 mg 1-1/2 tablets 3 times a day but this was cut back to 50 mg tablets, one half tablet 3 times a day. Her blood pressure is running higher and she has recorded it daily. Her systolic blood pressure in the last couple of weeks has been 130s-140s.  Past Medical History  Diagnosis Date  . PAF (paroxysmal atrial fibrillation)     a. on amio (02/2012 mild obstruction on PFT's) and xarelto.  . Essential hypertension   . Mitral valve disorders   . Tricuspid valve disorders, specified as nonrheumatic   . Neck mass     right, w/u including MRI negative  . Hypothyroidism   . Macular degeneration      Left, s/p inj. tx  . Senile osteoporosis     Reclast in the past  . Alopecia, unspecified   . Pure hypercholesterolemia   . Chronic diastolic CHF (congestive heart failure)     a. 11/2013 Echo: EF 60-65%, no rwma, mild TR, PASP 59mmHg.  [name] Kitchen Headache(784.0)   . Backache, unspecified   . Polycythemia vera(238.4)   . Gouty arthropathy, unspecified   . Insomnia, unspecified   . Other malaise and fatigue   . Hyposmolality and/or hyponatremia   . Hyperpotassemia   . Edema   . Closed fracture of lower end of radius with ulna   . Hypercalcemia   . Deafness     Left, s/p multiple surgeries  . Abnormal stress test     a. 11/2011 Ex MV: EF 80%, small, partially reversible anteroapical defect->mild ischemia vs attenuation-->med Rx.    Past Surgical History  Procedure Laterality Date  . Breast biopsy  06/18/1998    left  . Total abdominal hysterectomy    . Tonsillectomy    . Breast biopsy    . Tympanoplasty Bilateral   . Ablation  08/28/14    Back     Current Outpatient Prescriptions  Medication Sig Dispense Refill  . acetaminophen (TYLENOL) 650 MG CR tablet Take 650 mg by mouth 3 (three) times daily.    [name] Kitchen amiodarone (PACERONE) 200 MG tablet Take 200 mg by mouth daily.    . Biotin 5000 MCG  TABS Take 5,000 mcg by mouth daily.    . cholecalciferol (VITAMIN D) 1000 UNITS tablet Take 2,000 Units by mouth daily.     . fentaNYL (DURAGESIC - DOSED MCG/HR) 25 MCG/HR patch Place 1 patch (25 mcg total) onto the skin every 3 (three) days. 10 patch 0  . furosemide (LASIX) 40 MG tablet Take by mouth. 1.5 tablets (60mg ) two times a day    . gabapentin (NEURONTIN) 100 MG capsule Take by mouth. 1-2 tablets at betime    . hydrALAZINE (APRESOLINE) 50 MG tablet Take 1 tablet by mouth 3 (three) times daily.    . hydroxyurea (HYDREA) 500 MG capsule TAKE 2 CAPSULES ON MONDAYS AND TAKE 1 CAPSULE ALL OTHER DAYS OF THE WEEK. 35 capsule 6  . ibandronate (BONIVA) 150 MG tablet TAKE ONE TABLET ONCE A MONTH IN  THE MORNING ON AN EMPTY STOMACH WITH WATER. REMAIN UPRIGHT. 1 tablet 6  . levothyroxine (SYNTHROID, LEVOTHROID) 112 MCG tablet TAKE 1 TABLET EACH DAY. 30 tablet 5  . Magnesium 250 MG TABS Take 250 mg by mouth daily.     . Melatonin 5 MG CAPS Take 5 mg by mouth at bedtime.    . Multiple Vitamins-Minerals (PRESERVISION AREDS 2) CAPS Take 1 capsule by mouth 2 (two) times daily.    . polyethylene glycol (MIRALAX / GLYCOLAX) packet Take 17 g by mouth daily as needed (constipation).     . potassium chloride (K-DUR) 10 MEQ tablet Take 20 mEq by mouth daily.    [name] Kitchen senna-docusate (SENOKOT-S) 8.6-50 MG per tablet Take 2 tablets by mouth 2 (two) times daily. 120 tablet 3  . traMADol (ULTRAM) 50 MG tablet TAKE 1 OR 2 TABLETS EVERY 6 HOURS AS NEEDED FOR PAIN 60 tablet 0  . XARELTO 15 MG TABS tablet TAKE 1 TABLET ONCE DAILY. (Patient taking differently: TAKE 1 TABLET ONCE DAILY WITH SUPPER) 30 tablet 11   No current facility-administered medications for this visit.    Allergies:   Amlodipine    Social History:  The patient  reports that she quit smoking about 30 years ago. Her smoking use included Cigarettes. She has never used smokeless tobacco. She reports that she drinks about 0.6 oz of alcohol per week. She reports that she does not use illicit drugs.   Family History:  The patient's family history includes Breast cancer in her daughter; Cancer in her daughter, sister, sister, and sister; Heart disease in her father and sister; Hypertension in her father; Ovarian cancer (age of onset: 88) in her mother.    ROS:  Please see the history of present illness. All other systems are reviewed and negative.    PHYSICAL EXAM: VS:  BP 148/68 mmHg  Pulse 77  Ht 5' (1.524 m)  Wt 118 lb (53.524 kg)  BMI 23.05 kg/m2  SpO2 96% , BMI Body mass index is 23.05 kg/(m^2). GEN: Well nourished, well developed, slender, elderly female, in no acute distress HEENT: normal Neck: minimal JVD, negative hepatojugular  reflux, radiation of murmur to carotid but no bruits, no masses Cardiac: RRR; 2/6 murmur, no rubs, or gallops, trace-1+ pedal edema, R>L Respiratory:  Few rales, good air exchange, normal work of breathing GI: soft, nontender, nondistended, + BS MS: no deformity or atrophy Skin: warm and dry, no rash Neuro:  Strength and sensation are intact Psych: euthymic mood, full affect   ECHO:  09/03/2014 Study Conclusions - Left ventricle: The cavity size was normal. Wall thickness wasnormal. Systolic function was normal. The estimated  ejectionfraction was in the range of 60% to 65%. Wall motion was normal;there were no regional wall motion abnormalities. - Mitral valve: There was mild regurgitation. - Right atrium: The atrium was mildly dilated. - Pulmonary arteries: Systolic pressure was mildly increased.  EKG:  EKG is not ordered today.   Recent Labs: 07/26/2014: Pro B Natriuretic peptide (BNP) 289.0* 08/15/2014: TSH 1.14 09/01/2014: B Natriuretic Peptide 502.4* 09/02/2014: ALT 17; Hemoglobin 13.0; Platelets 425* 09/25/2014: BUN 16; Creatinine, Ser 0.83; Potassium 4.1; Sodium 136    Lipid Panel    Component Value Date/Time   CHOL 189 02/22/2012 1218   TRIG 92.0 02/22/2012 1218   HDL 85.90 02/22/2012 1218   CHOLHDL 2 02/22/2012 1218   VLDL 18.4 02/22/2012 1218   LDLCALC 85 02/22/2012 1218     Wt Readings from Last 3 Encounters:  10/08/14 118 lb (53.524 kg)  09/11/14 116 lb (52.617 kg)  09/02/14 114 lb 10.2 oz (52 kg)     Other studies Reviewed: Additional studies/ records that were reviewed today include:  Hospital records, previous office notes, stress test, and echo reports  ASSESSMENT AND PLAN:  1. Atrial fibrillation: her heart is regular in rate and rhythm and she is maintaining sinus rhythm by exam. She is to continue the amiodarone and the Xarelto.  2. Chronic diastolic CHF: Her weight is up minimally but she is doing well from a volume standpoint and not having  significant volume overload by exam. Her blood pressures running slightly higher and therefore we will increase her hydralazine up to 50 mg 3 times a day. She is to follow-up in the office and get a BMET at her next office visit. No change to her Lasix dose, but she understands that if her weight trends up or she begins to get short of breath, she is to call us.  She will be on intermittent higher dose Lasix as needed.  Current medicines are reviewed at length with the patient today.  The patient does not have concerns regarding medicines.  The following changes have been made:  Increase hydralazine to 50 mg 3 times a day  Labs/ tests ordered today include:   Orders Placed This Encounter  Procedures  . Basic metabolic panel     Disposition:   FU with Dr. Aundra Dubin in in October 2016 as scheduled with a BMET.  Augusto Garbe  10/08/2014 2:32 PM    [facility] [address], [place], [place]  [address] Phone: [phone]; Fax: [phone]

## 2014-10-08 NOTE — Patient Instructions (Signed)
Medication Instructions:  Your physician has recommended you make the following change in your medication:  INCREASE Hydralazine to 50mg  3 times daily   Labwork: Bmet same day as your appt with Dr.McLean  Testing/Procedures: None ordered  Follow-Up: Follow up as planned with Dr.McLean in October 2016  Any Other Special Instructions Will Be Listed Below (If Applicable).

## 2014-10-15 ENCOUNTER — Other Ambulatory Visit: Payer: Self-pay | Admitting: *Deleted

## 2014-10-15 NOTE — Telephone Encounter (Signed)
Patient will have Cary pick up her Fentanyl patch on Friday

## 2014-10-17 ENCOUNTER — Other Ambulatory Visit: Payer: Self-pay | Admitting: *Deleted

## 2014-10-17 MED ORDER — FENTANYL 25 MCG/HR TD PT72: 25.0000 ug | MEDICATED_PATCH | TRANSDERMAL | Status: DC

## 2014-10-17 NOTE — Telephone Encounter (Signed)
Patient requested and daughter, Doristine Mango will pick up

## 2014-10-28 DIAGNOSIS — M47816 Spondylosis without myelopathy or radiculopathy, lumbar region: Secondary | ICD-10-CM | POA: Diagnosis not present

## 2014-10-29 ENCOUNTER — Other Ambulatory Visit: Payer: Self-pay

## 2014-10-29 ENCOUNTER — Telehealth: Payer: Self-pay | Admitting: Cardiology

## 2014-10-29 DIAGNOSIS — R5383 Other fatigue: Secondary | ICD-10-CM | POA: Diagnosis not present

## 2014-10-29 DIAGNOSIS — I10 Essential (primary) hypertension: Secondary | ICD-10-CM | POA: Diagnosis not present

## 2014-10-29 DIAGNOSIS — E780 Pure hypercholesterolemia: Secondary | ICD-10-CM | POA: Diagnosis not present

## 2014-10-29 DIAGNOSIS — E039 Hypothyroidism, unspecified: Secondary | ICD-10-CM | POA: Diagnosis not present

## 2014-10-29 LAB — BASIC METABOLIC PANEL
BUN: 22 mg/dL — AB (ref 4–21)
Creatinine: 0.9 mg/dL (ref 0.5–1.1)
Glucose: 80 mg/dL
POTASSIUM: 4 mmol/L (ref 3.4–5.3)
Sodium: 137 mmol/L (ref 137–147)

## 2014-10-29 LAB — CBC AND DIFFERENTIAL
HCT: 40 % (ref 36–46)
Hemoglobin: 13.7 g/dL (ref 12.0–16.0)
Platelets: 384 10*3/uL (ref 150–399)
WBC: 8.3 10^3/mL

## 2014-10-29 LAB — LIPID PANEL
Cholesterol: 164 mg/dL (ref 0–200)
HDL: 78 mg/dL — AB (ref 35–70)
LDL Cholesterol: 69 mg/dL
LDL/HDL RATIO: 0.9
TRIGLYCERIDES: 133 mg/dL (ref 40–160)

## 2014-10-29 LAB — TSH: TSH: 0.72 u[IU]/mL (ref 0.41–5.90)

## 2014-10-29 NOTE — Telephone Encounter (Signed)
Pt c/o swelling: STAT is pt has developed SOB within 24 hours  1. How long have you been experiencing swelling? Pt reports she has experienced this for a while. Per pt, the PA's have readjusted the medication but with in the last week she has been more swollen than normal. Pt states that she can tell that her fingers are swollen as well.    2. Where is the swelling located? Feet and hands   3.  Are you currently taking a "fluid pill"? Yes   4.  Are you currently SOB? No   5.  Have you traveled recently? No   Comments: pt states that her Bp has been running high as well. No readings provided. Please call to discuss further.

## 2014-10-29 NOTE — Telephone Encounter (Signed)
Increase to Lasix 80 mg bid x 3 days then back to 60 mg bid.

## 2014-10-29 NOTE — Telephone Encounter (Signed)
Called patient.  Patient stated that she has seen increased edema in b/l feet in the last week and edema startedhas started in her b/l hands two days ago.  Patient doesn't know of any wt.gain due to not taking daily wts.  Denies sob & c/p.  Todays b/p 140/68, yesterday's 150/86.  Says that she has been keeping feet elevated for most of the day.  Currently taking Lasix 40mg  1.5 tablets twice a day.  Wants to know what to do.

## 2014-10-30 NOTE — Telephone Encounter (Signed)
Called patient, advised her to increase her Lasix to 2 tablets(80mg ) twice a day for three days then back to 1.5 tabs(60mg ) twice a day.  Call back if after the three days she hasn't seen any improvement.

## 2014-11-05 ENCOUNTER — Telehealth: Payer: Self-pay | Admitting: Hematology and Oncology

## 2014-11-05 NOTE — Telephone Encounter (Signed)
s.w. pt and r/s appt to earlier time....pt ok and aware

## 2014-11-06 ENCOUNTER — Non-Acute Institutional Stay: Payer: Medicare Other | Admitting: Internal Medicine

## 2014-11-06 ENCOUNTER — Encounter: Payer: Self-pay | Admitting: Internal Medicine

## 2014-11-06 VITALS — BP 118/62 | HR 60 | Temp 98.2°F | Wt 119.0 lb

## 2014-11-06 DIAGNOSIS — R079 Chest pain, unspecified: Secondary | ICD-10-CM | POA: Diagnosis not present

## 2014-11-06 DIAGNOSIS — I5032 Chronic diastolic (congestive) heart failure: Secondary | ICD-10-CM

## 2014-11-06 DIAGNOSIS — I480 Paroxysmal atrial fibrillation: Secondary | ICD-10-CM

## 2014-11-06 DIAGNOSIS — Z23 Encounter for immunization: Secondary | ICD-10-CM

## 2014-11-06 DIAGNOSIS — K5903 Drug induced constipation: Secondary | ICD-10-CM

## 2014-11-06 DIAGNOSIS — K5909 Other constipation: Secondary | ICD-10-CM | POA: Diagnosis not present

## 2014-11-06 DIAGNOSIS — I952 Hypotension due to drugs: Secondary | ICD-10-CM | POA: Diagnosis not present

## 2014-11-06 DIAGNOSIS — M5440 Lumbago with sciatica, unspecified side: Secondary | ICD-10-CM | POA: Diagnosis not present

## 2014-11-06 DIAGNOSIS — T50905A Adverse effect of unspecified drugs, medicaments and biological substances, initial encounter: Secondary | ICD-10-CM | POA: Diagnosis not present

## 2014-11-06 DIAGNOSIS — I2510 Atherosclerotic heart disease of native coronary artery without angina pectoris: Secondary | ICD-10-CM

## 2014-11-06 MED ORDER — FENTANYL 12 MCG/HR TD PT72: 12.0000 ug | MEDICATED_PATCH | TRANSDERMAL | Status: DC

## 2014-11-06 NOTE — Progress Notes (Signed)
Location:  [facility]  Code Status: DNR Goals of Care: Advanced Directive information Type of Advance Directive: [place];Living will   Chief Complaint  Patient presents with  . Medical Management of Chronic Issues    hypotension, back pain, constipation    HPI: Patient is a 81 y.o. female seen in the office today for medical management of chronic illnesses.  She has been coming to have her BP checked daily by [facility] and readings have ranged mostly in the 323'F systolic with a few higher and lower outliers, but no more hypotension as before. No shortness of breath or cough. She wears compression stockings regularly.  She has back pain that has been going on since March. She is using fentanyl patch with relief. She notes increased daytime sleepiness. Her pain is tolerable with current regimen and s/p ablation. She would like to discuss decreasing fentanyl to possibly decrease sleepiness.  She is also having constipation. Last BM was day before yesterday. She is using sennakot 2 times daily. Uses Miralax and MOM additionally if needed. Takes Miralax if no BM's for 3 days.    Review of Systems:  Review of Systems  Constitutional: Negative for fever and chills.  Respiratory: Negative for cough, shortness of breath and wheezing.   Cardiovascular: Negative for chest pain and palpitations.  Gastrointestinal: Positive for constipation. Negative for nausea, vomiting, abdominal pain and diarrhea.  Musculoskeletal: Positive for back pain.  Neurological: Negative for dizziness.  Psychiatric/Behavioral: Negative for depression. The patient is not nervous/anxious.     Past Medical History  Diagnosis Date  . PAF (paroxysmal atrial fibrillation)     a. on amio (02/2012 mild obstruction on PFT's) and xarelto.  . Essential hypertension   . Mitral valve disorders   . Tricuspid valve disorders, specified as nonrheumatic   . Neck mass     right, w/u including  MRI negative  . Hypothyroidism   . Macular degeneration     Left, s/p inj. tx  . Senile osteoporosis     Reclast in the past  . Alopecia, unspecified   . Pure hypercholesterolemia   . Chronic diastolic CHF (congestive heart failure)     a. 11/2013 Echo: EF 60-65%, no rwma, mild TR, PASP 30mmHg.  [name] Kitchen Headache(784.0)   . Backache, unspecified   . Polycythemia vera(238.4)   . Gouty arthropathy, unspecified   . Insomnia, unspecified   . Other malaise and fatigue   . Hyposmolality and/or hyponatremia   . Hyperpotassemia   . Edema   . Closed fracture of lower end of radius with ulna   . Hypercalcemia   . Deafness     Left, s/p multiple surgeries  . Abnormal stress test     a. 11/2011 Ex MV: EF 80%, small, partially reversible anteroapical defect->mild ischemia vs attenuation-->med Rx.    Past Surgical History  Procedure Laterality Date  . Breast biopsy  06/18/1998    left  . Total abdominal hysterectomy    . Tonsillectomy    . Breast biopsy    . Tympanoplasty Bilateral   . Ablation  08/28/14    Back     Allergies  Allergen Reactions  . Amlodipine Swelling    Lower extremity edema    Medications: Patient's Medications  New Prescriptions   No medications on file  Previous Medications   ACETAMINOPHEN (TYLENOL) 650 MG CR TABLET    Take 650 mg by mouth 3 (three) times daily.   AMIODARONE (PACERONE) 200 MG TABLET  Take 200 mg by mouth daily.   BIOTIN 5000 MCG TABS    Take 5,000 mcg by mouth daily.   CHOLECALCIFEROL (VITAMIN D) 1000 UNITS TABLET    Take 2,000 Units by mouth daily.    FENTANYL (DURAGESIC - DOSED MCG/HR) 25 MCG/HR PATCH    Place 1 patch (25 mcg total) onto the skin every 3 (three) days.   FUROSEMIDE (LASIX) 40 MG TABLET    Take by mouth. 1.5 tablets (60mg ) two times a day   GABAPENTIN (NEURONTIN) 100 MG CAPSULE    Take by mouth. 1-2 tablets at betime   HYDRALAZINE (APRESOLINE) 50 MG TABLET    Take 1 tablet by mouth 3 (three) times daily.   HYDROXYUREA  (HYDREA) 500 MG CAPSULE    TAKE 2 CAPSULES ON MONDAYS AND TAKE 1 CAPSULE ALL OTHER DAYS OF THE WEEK.   IBANDRONATE (BONIVA) 150 MG TABLET    TAKE ONE TABLET ONCE A MONTH IN THE MORNING ON AN EMPTY STOMACH WITH WATER. REMAIN UPRIGHT.   LEVOTHYROXINE (SYNTHROID, LEVOTHROID) 112 MCG TABLET    TAKE 1 TABLET EACH DAY.   MAGNESIUM 250 MG TABS    Take 250 mg by mouth daily.    MELATONIN 5 MG CAPS    Take 5 mg by mouth at bedtime.   MULTIPLE VITAMINS-MINERALS (PRESERVISION AREDS 2) CAPS    Take 1 capsule by mouth 2 (two) times daily.   POLYETHYLENE GLYCOL (MIRALAX / GLYCOLAX) PACKET    Take 17 g by mouth daily as needed (constipation).    POTASSIUM CHLORIDE (K-DUR) 10 MEQ TABLET    Take 20 mEq by mouth daily.   SENNA-DOCUSATE (SENOKOT-S) 8.6-50 MG PER TABLET    Take 2 tablets by mouth 2 (two) times daily.   TRAMADOL (ULTRAM) 50 MG TABLET    TAKE 1 OR 2 TABLETS EVERY 6 HOURS AS NEEDED FOR PAIN   XARELTO 15 MG TABS TABLET    TAKE 1 TABLET ONCE DAILY.  Modified Medications   No medications on file  Discontinued Medications   FENTANYL (DURAGESIC - DOSED MCG/HR) 25 MCG/HR PATCH    Place 1 patch (25 mcg total) onto the skin every 3 (three) days.    Physical Exam: There were no vitals filed for this visit. Physical Exam  Cardiovascular: Normal rate and intact distal pulses.   Irregular rhythm, trace bilateral ankle edema, compression hose in place  Pulmonary/Chest: Effort normal and breath sounds normal. No respiratory distress. She has no wheezes. She has no rales.  Abdominal: Soft. Bowel sounds are normal. She exhibits distension. She exhibits no mass. There is no tenderness.  Musculoskeletal: She exhibits edema.  Uses a walker  Neurological: She is alert.  Skin: Skin is warm and dry.  Psychiatric: She has a normal mood and affect. Her behavior is normal.    Labs reviewed: Basic Metabolic Panel:  Recent Labs  05/15/14 1540  08/15/14 1651 09/01/14 1945 09/02/14 0641 09/25/14 1151 10/29/14    NA  --   < >  --  130* 137 136 137  K  --   < >  --  3.7 3.4* 4.1 4.0  CL  --   < >  --  93* 101 98  --   CO2  --   < >  --  28 28 31   --   GLUCOSE  --   < >  --  99 90 70  --   BUN  --   < >  --  29*  21* 16 22*  CREATININE  --   < >  --  1.20* 0.86 0.83 0.9  CALCIUM  --   < >  --  10.0 9.9 10.5  --   TSH 1.10  --  1.14  --   --   --  0.72  < > = values in this interval not displayed. Liver Function Tests:  Recent Labs  08/15/14 1651 09/01/14 1945 09/02/14 0641  AST 17 22 20   ALT 12 20 17   ALKPHOS 62 59 52  BILITOT 0.6 0.5 0.9  PROT 6.3 6.2* 5.9*  ALBUMIN 4.0 3.9 3.8   No results for input(s): LIPASE, AMYLASE in the last 8760 hours. No results for input(s): AMMONIA in the last 8760 hours. CBC:  Recent Labs  05/09/14 1302 08/15/14 1651 09/01/14 1945 09/02/14 0641 10/29/14  WBC 8.1 13.8* 16.2* 12.4* 8.3  NEUTROABS 6.1  --  13.1* 9.9*  --   HGB 12.5 12.8 13.1 13.0 13.7  HCT 38.4 38.5 39.5 39.1 40  MCV 111.9* 113.7 Repeated and verified X2.* 115.8* 115.7*  --   PLT 349 469.0* 469* 425* 384   Lipid Panel:  Recent Labs  10/29/14  CHOL 164  HDL 78*  LDLCALC 69  TRIG 133   No results found for: HGBA1C    Assessment/Plan 1. Midline low back pain with sciatica, sciatica laterality unspecified -Improved with medication refill and since ablation. -Will decrease fentanyl dose in the hopes of increasing her daytime alertness -Continue current medication regimen other than fentanyl change - fentaNYL (DURAGESIC - DOSED MCG/HR) 12 MCG/HR; Place 1 patch (12.5 mcg total) onto the skin every 3 (three) days.  Dispense: 10 patch; Refill: 0  2. Chest pain, unspecified chest pain type -Etiology unknown -No recurrent episodes since hospital admission  3. Hypotension due to drugs -Resolved -BP at goal -Continue current medication regimen  4. Paroxysmal atrial fibrillation -Rhythm controlled with amoiodorone -Continues to have irregular rhythm, but with regular rate and  asymptomatic -Anticoag with xarelto -No evidence of bleeding -Continue with current medications  5. Constipation due to pain medication -Increase use of Miralax to use if no BM after two days, rather than waiting for third day -Continue daily stool softener as taking  6. Chronic diastolic CHF (congestive heart failure) -Managed with current medications -BLE edema controlled with compression stockings -Continue use of current medications  -Continue follow-up with cardiologist     Next appt: Follow in 4 months with Dr. Mariea Clonts for medical management of chronic illnesses.  Mariana Kaufman, RN, BSN Student- Nurse Practitioner- [facility] [address], [place] [address] Office Phone:  [phone] Office Fax:  [phone]

## 2014-11-07 ENCOUNTER — Other Ambulatory Visit: Payer: Self-pay | Admitting: Hematology and Oncology

## 2014-11-07 ENCOUNTER — Other Ambulatory Visit (HOSPITAL_BASED_OUTPATIENT_CLINIC_OR_DEPARTMENT_OTHER): Payer: Medicare Other

## 2014-11-07 ENCOUNTER — Other Ambulatory Visit: Payer: Medicare Other

## 2014-11-07 ENCOUNTER — Telehealth: Payer: Self-pay | Admitting: Hematology and Oncology

## 2014-11-07 ENCOUNTER — Ambulatory Visit (HOSPITAL_BASED_OUTPATIENT_CLINIC_OR_DEPARTMENT_OTHER): Payer: Medicare Other | Admitting: Hematology and Oncology

## 2014-11-07 ENCOUNTER — Ambulatory Visit: Payer: Medicare Other | Admitting: Hematology and Oncology

## 2014-11-07 ENCOUNTER — Encounter: Payer: Self-pay | Admitting: Hematology and Oncology

## 2014-11-07 VITALS — BP 161/67 | HR 70 | Temp 98.0°F | Resp 18 | Ht 60.0 in | Wt 118.9 lb

## 2014-11-07 DIAGNOSIS — L659 Nonscarring hair loss, unspecified: Secondary | ICD-10-CM | POA: Diagnosis not present

## 2014-11-07 DIAGNOSIS — I10 Essential (primary) hypertension: Secondary | ICD-10-CM | POA: Diagnosis not present

## 2014-11-07 DIAGNOSIS — R600 Localized edema: Secondary | ICD-10-CM

## 2014-11-07 DIAGNOSIS — D45 Polycythemia vera: Secondary | ICD-10-CM

## 2014-11-07 LAB — CBC WITH DIFFERENTIAL/PLATELET
BASO%: 0.1 % (ref 0.0–2.0)
Basophils Absolute: 0 10*3/uL (ref 0.0–0.1)
EOS%: 1.2 % (ref 0.0–7.0)
Eosinophils Absolute: 0.1 10*3/uL (ref 0.0–0.5)
HEMATOCRIT: 42.3 % (ref 34.8–46.6)
HEMOGLOBIN: 14.1 g/dL (ref 11.6–15.9)
LYMPH#: 1.6 10*3/uL (ref 0.9–3.3)
LYMPH%: 18.9 % (ref 14.0–49.7)
MCH: 37.7 pg — ABNORMAL HIGH (ref 25.1–34.0)
MCHC: 33.4 g/dL (ref 31.5–36.0)
MCV: 113.1 fL — ABNORMAL HIGH (ref 79.5–101.0)
MONO#: 0.9 10*3/uL (ref 0.1–0.9)
MONO%: 11 % (ref 0.0–14.0)
NEUT%: 68.8 % (ref 38.4–76.8)
NEUTROS ABS: 5.7 10*3/uL (ref 1.5–6.5)
PLATELETS: 382 10*3/uL (ref 145–400)
RBC: 3.74 10*6/uL (ref 3.70–5.45)
RDW: 16 % — ABNORMAL HIGH (ref 11.2–14.5)
WBC: 8.3 10*3/uL (ref 3.9–10.3)

## 2014-11-07 NOTE — Assessment & Plan Note (Signed)
She complained of hair loss which she thought was attributed to hydroxyurea. I told the patient this is not a common side effects and the dose she is taking is considered a low dose.  I reassured the patient.

## 2014-11-07 NOTE — Assessment & Plan Note (Signed)
She tolerated treatment well. I recommend reducing hydroxyurea to 500 mg daily and recheck in 6 months.

## 2014-11-07 NOTE — Telephone Encounter (Signed)
Gave and printed appt scheda nd avs for pt for march 2017 °

## 2014-11-07 NOTE — Assessment & Plan Note (Signed)
she will continue current medical management. I recommend close follow-up with primary care doctor for medication adjustment.  

## 2014-11-07 NOTE — Progress Notes (Signed)
[facility] OFFICE PROGRESS NOTE  Patient Care Team: Gayland Curry, DO as PCP - General (Geriatric Medicine) [facility] Hurman Horn, MD as Consulting Physician (Ophthalmology) Larey Dresser, MD as Consulting Physician (Cardiology) Heath Lark, MD as Consulting Physician (Hematology and Oncology) Heath Lark, MD as Consulting Physician (Hematology and Oncology) Clent Jacks, MD as Consulting Physician (Ophthalmology) Jerrell Belfast, MD as Consulting Physician (Otolaryngology)  SUMMARY OF ONCOLOGIC HISTORY:   Polycythemia vera   08/05/2011 Pathology Results Peripheral blood JAK2 mutation was positive with low serum erythropoietin level. Bone marrow aspirate and biopsy was not performed.   08/12/2011 -  Chemotherapy She is started on hydroxyurea with periodic phlebotomy.    INTERVAL HISTORY: Please see below for problem oriented charting.  she feels well. She complained of having hair loss. Denies recent infection. No recent diagnosis of blood clots. She is taking 500 mg hydroxyurea per day every day except on Monday she take 1000 mg.  she have chronic bilateral leg edema. REVIEW OF SYSTEMS:   Constitutional: Denies fevers, chills or abnormal weight loss Eyes: Denies blurriness of vision Ears, nose, mouth, throat, and face: Denies mucositis or sore throat Respiratory: Denies cough, dyspnea or wheezes Cardiovascular: Denies palpitation, chest discomfort  Gastrointestinal:  Denies nausea, heartburn or change in bowel habits Skin: Denies abnormal skin rashes Lymphatics: Denies new lymphadenopathy or easy bruising Neurological:Denies numbness, tingling or new weaknesses Behavioral/Psych: Mood is stable, no new changes  All other systems were reviewed with the patient and are negative.  I have reviewed the past medical history, past surgical history, social history and family history with the patient and they are unchanged from previous  note.  ALLERGIES:  is allergic to amlodipine.  MEDICATIONS:  Current Outpatient Prescriptions  Medication Sig Dispense Refill  . acetaminophen (TYLENOL) 650 MG CR tablet Take 650 mg by mouth 3 (three) times daily.    [name] Kitchen amiodarone (PACERONE) 200 MG tablet Take 200 mg by mouth daily.    . Biotin 5000 MCG TABS Take 5,000 mcg by mouth daily.    . cholecalciferol (VITAMIN D) 1000 UNITS tablet Take 2,000 Units by mouth daily.     . fentaNYL (DURAGESIC - DOSED MCG/HR) 12 MCG/HR Place 1 patch (12.5 mcg total) onto the skin every 3 (three) days. 10 patch 0  . furosemide (LASIX) 40 MG tablet Take by mouth. 1.5 tablets (60mg ) two times a day    . gabapentin (NEURONTIN) 100 MG capsule Take by mouth. 1-2 tablets at betime    . hydrALAZINE (APRESOLINE) 50 MG tablet Take 1 tablet by mouth 3 (three) times daily.    . hydroxyurea (HYDREA) 500 MG capsule TAKE 2 CAPSULES ON MONDAYS AND TAKE 1 CAPSULE ALL OTHER DAYS OF THE WEEK. 35 capsule 6  . ibandronate (BONIVA) 150 MG tablet TAKE ONE TABLET ONCE A MONTH IN THE MORNING ON AN EMPTY STOMACH WITH WATER. REMAIN UPRIGHT. 1 tablet 6  . levothyroxine (SYNTHROID, LEVOTHROID) 112 MCG tablet TAKE 1 TABLET EACH DAY. 30 tablet 5  . Magnesium 250 MG TABS Take 250 mg by mouth daily.     . Melatonin 5 MG CAPS Take 5 mg by mouth at bedtime.    . Multiple Vitamins-Minerals (PRESERVISION AREDS 2) CAPS Take 1 capsule by mouth 2 (two) times daily.    . polyethylene glycol (MIRALAX / GLYCOLAX) packet Take 17 g by mouth daily as needed (constipation).     . potassium chloride (K-DUR) 10 MEQ tablet Take 20 mEq by  mouth daily.    [name] Kitchen senna-docusate (SENOKOT-S) 8.6-50 MG per tablet Take 2 tablets by mouth 2 (two) times daily. 120 tablet 3  . traMADol (ULTRAM) 50 MG tablet TAKE 1 OR 2 TABLETS EVERY 6 HOURS AS NEEDED FOR PAIN 60 tablet 0  . XARELTO 15 MG TABS tablet TAKE 1 TABLET ONCE DAILY. (Patient taking differently: TAKE 1 TABLET ONCE DAILY WITH SUPPER) 30 tablet 11   No current  facility-administered medications for this visit.    PHYSICAL EXAMINATION: ECOG PERFORMANCE STATUS: 0 - Asymptomatic  Filed Vitals:   11/07/14 1303  BP: 161/67  Pulse: 70  Temp: 98 F (36.7 C)  Resp: 18   Filed Weights   11/07/14 1303  Weight: 118 lb 14.4 oz (53.933 kg)    GENERAL:alert, no distress and comfortable SKIN: skin color, texture, turgor are normal, no rashes or significant lesions EYES: normal, Conjunctiva are pink and non-injected, sclera clear OROPHARYNX:no exudate, no erythema and lips, buccal mucosa, and tongue normal  NECK: supple, thyroid normal size, non-tender, without nodularity LYMPH:  no palpable lymphadenopathy in the cervical, axillary or inguinal LUNGS: clear to auscultation and percussion with normal breathing effort HEART: regular rate & rhythm and no murmurs  With mild bilateral lower extremity edema ABDOMEN:abdomen soft, non-tender and normal bowel sounds Musculoskeletal:no cyanosis of digits and no clubbing  NEURO: alert & oriented x 3 with fluent speech, no focal motor/sensory deficits  LABORATORY DATA:  I have reviewed the data as listed    Component Value Date/Time   NA 137 10/29/2014   NA 136 09/25/2014 1151   K 4.0 10/29/2014   CL 98 09/25/2014 1151   CO2 31 09/25/2014 1151   GLUCOSE 70 09/25/2014 1151   BUN 22* 10/29/2014   BUN 16 09/25/2014 1151   CREATININE 0.9 10/29/2014   CREATININE 0.83 09/25/2014 1151   CREATININE 1.33* 02/08/2012 1624   CALCIUM 10.5 09/25/2014 1151   PROT 5.9* 09/02/2014 0641   ALBUMIN 3.8 09/02/2014 0641   AST 20 09/02/2014 0641   ALT 17 09/02/2014 0641   ALKPHOS 52 09/02/2014 0641   BILITOT 0.9 09/02/2014 0641   GFRNONAA >60 09/02/2014 0641   GFRAA >60 09/02/2014 0641    No results found for: SPEP, UPEP  Lab Results  Component Value Date   WBC 8.3 11/07/2014   NEUTROABS 5.7 11/07/2014   HGB 14.1 11/07/2014   HCT 42.3 11/07/2014   MCV 113.1* 11/07/2014   PLT 382 11/07/2014      Chemistry       Component Value Date/Time   NA 137 10/29/2014   NA 136 09/25/2014 1151   K 4.0 10/29/2014   CL 98 09/25/2014 1151   CO2 31 09/25/2014 1151   BUN 22* 10/29/2014   BUN 16 09/25/2014 1151   CREATININE 0.9 10/29/2014   CREATININE 0.83 09/25/2014 1151   CREATININE 1.33* 02/08/2012 1624   GLU 80 10/29/2014      Component Value Date/Time   CALCIUM 10.5 09/25/2014 1151   ALKPHOS 52 09/02/2014 0641   AST 20 09/02/2014 0641   ALT 17 09/02/2014 0641   BILITOT 0.9 09/02/2014 0641      ASSESSMENT & PLAN:  Polycythemia vera  She tolerated treatment well. I recommend reducing hydroxyurea to 500 mg daily and recheck in 6 months.  Essential hypertension she will continue current medical management. I recommend close follow-up with primary care doctor for medication adjustment.   Hair loss  She complained of hair loss which she thought was attributed  to hydroxyurea. I told the patient this is not a common side effects and the dose she is taking is considered a low dose.  I reassured the patient.  Bilateral leg edema  This is chronic in nature. She is currently on medical management.     Orders Placed This Encounter  Procedures  . CBC with Differential/Platelet    Standing Status: Future     Number of Occurrences:      Standing Expiration Date: 12/12/2015   All questions were answered. The patient knows to call the clinic with any problems, questions or concerns. No barriers to learning was detected. I spent 15 minutes counseling the patient face to face. The total time spent in the appointment was 20 minutes and more than 50% was on counseling and review of test results     [facility], [place], MD 11/07/2014 4:33 PM

## 2014-11-07 NOTE — Assessment & Plan Note (Signed)
This is chronic in nature. She is currently on medical management.

## 2014-11-17 ENCOUNTER — Other Ambulatory Visit: Payer: Self-pay | Admitting: Cardiology

## 2014-11-18 ENCOUNTER — Telehealth: Payer: Self-pay | Admitting: *Deleted

## 2014-11-18 DIAGNOSIS — M5440 Lumbago with sciatica, unspecified side: Secondary | ICD-10-CM

## 2014-11-18 NOTE — Telephone Encounter (Signed)
Patient called and stated that she needs a RF on her Fentanyl Patch. It shows in the system it was ordered on 11/06/2014. Patient stated that you decreased her dosing. Can she have a Rx brought to her on Wednesday to [facility] for this. Please Advise.

## 2014-11-18 NOTE — Telephone Encounter (Signed)
Yes fentanyl 12.30mcg (or 12 mcg if that's how it's in epic)

## 2014-11-19 MED ORDER — FENTANYL 12 MCG/HR TD PT72: 12.0000 ug | MEDICATED_PATCH | TRANSDERMAL | Status: DC

## 2014-11-19 NOTE — Telephone Encounter (Signed)
Rx printed and given to [facility] to take to [facility].

## 2014-11-22 ENCOUNTER — Other Ambulatory Visit: Payer: Self-pay | Admitting: *Deleted

## 2014-11-22 MED ORDER — TRAMADOL HCL 50 MG PO TABS: ORAL_TABLET | ORAL | Status: DC

## 2014-11-22 NOTE — Telephone Encounter (Signed)
[facility]  

## 2014-11-26 ENCOUNTER — Encounter: Payer: Self-pay | Admitting: Internal Medicine

## 2014-11-28 DIAGNOSIS — Z23 Encounter for immunization: Secondary | ICD-10-CM | POA: Diagnosis not present

## 2014-11-29 ENCOUNTER — Ambulatory Visit (INDEPENDENT_AMBULATORY_CARE_PROVIDER_SITE_OTHER): Payer: Medicare Other | Admitting: Cardiology

## 2014-11-29 ENCOUNTER — Encounter: Payer: Self-pay | Admitting: Cardiology

## 2014-11-29 VITALS — BP 98/62 | HR 48 | Ht 62.0 in | Wt 114.0 lb

## 2014-11-29 DIAGNOSIS — R001 Bradycardia, unspecified: Secondary | ICD-10-CM | POA: Diagnosis not present

## 2014-11-29 DIAGNOSIS — I480 Paroxysmal atrial fibrillation: Secondary | ICD-10-CM

## 2014-11-29 DIAGNOSIS — I158 Other secondary hypertension: Secondary | ICD-10-CM | POA: Diagnosis not present

## 2014-11-29 DIAGNOSIS — I5032 Chronic diastolic (congestive) heart failure: Secondary | ICD-10-CM | POA: Diagnosis not present

## 2014-11-29 DIAGNOSIS — I2510 Atherosclerotic heart disease of native coronary artery without angina pectoris: Secondary | ICD-10-CM | POA: Diagnosis not present

## 2014-11-29 DIAGNOSIS — I498 Other specified cardiac arrhythmias: Secondary | ICD-10-CM | POA: Diagnosis not present

## 2014-11-29 LAB — COMPREHENSIVE METABOLIC PANEL
ALBUMIN: 4.3 g/dL (ref 3.6–5.1)
ALT: 17 U/L (ref 6–29)
AST: 22 U/L (ref 10–35)
Alkaline Phosphatase: 61 U/L (ref 33–130)
BUN: 26 mg/dL — ABNORMAL HIGH (ref 7–25)
CHLORIDE: 99 mmol/L (ref 98–110)
CO2: 26 mmol/L (ref 20–31)
Calcium: 10.6 mg/dL — ABNORMAL HIGH (ref 8.6–10.4)
Creat: 0.87 mg/dL (ref 0.60–0.88)
Glucose, Bld: 64 mg/dL — ABNORMAL LOW (ref 65–99)
Potassium: 3.8 mmol/L (ref 3.5–5.3)
Sodium: 138 mmol/L (ref 135–146)
TOTAL PROTEIN: 6.8 g/dL (ref 6.1–8.1)
Total Bilirubin: 0.7 mg/dL (ref 0.2–1.2)

## 2014-11-29 MED ORDER — AMIODARONE HCL 100 MG PO TABS: 100.0000 mg | ORAL_TABLET | Freq: Every day | ORAL | Status: DC

## 2014-11-29 NOTE — Patient Instructions (Signed)
Medication Instructions:  Your physician has recommended you make the following change in your medication:  1) DECREASE Amiodarone to 100 mg tablet by mouth ONCE daily    Labwork: Your physician recommends that you return for lab work in: CMET   Testing/Procedures: none  Follow-Up: Your physician wants you to follow-up in: 6 months with Dr. Aundra Dubin. You will receive a reminder letter in the mail two months in advance. If you don't receive a letter, please call our office to schedule the follow-up appointment.   Any Other Special Instructions Will Be Listed Below (If Applicable).

## 2014-11-30 NOTE — Progress Notes (Signed)
Patient ID: Sarah Mays, female   DOB: 1930/11/09, 81 y.o.   MRN: [id number] PCP: Dr Hollace Kinnier  81 yo with paroxysmal atrial fibrillation and chronic diastolic CHF returns for cardiology evaluation.  She is on amiodarone to try to maintain NSR.  She is tolerating Xarelto without melena or BRBPR.  In 7/16, she was admitted with chest pain.  Cardiolite showed no ischemia or infarction.  Echo in 7/16 showed EF 60-65%.    Recently, no further chest pain.  No exertional dyspnea.  No palpitations.  She is in NSR today.  She uses a walker because of back pain. Generally feels pretty good.  BP is unusually low today, she denies lightheadedness.  SBP runs 120s-130s when she checks at home.  Weight is down 3 lbs.  HR is low.   Labs (7/13): K 4.7, creatinine 0.56 Labs (8/13): K 5.4, creatinine 0.69 Labs (11/13): TSH 6.45, K 5.4=>3.8, creatinine 1.5=>1.2, BUN 70=>35, HCT 33.2, BNP 866, AST 25, ALT 36 Labs (12/13): K 3.5 => 2.8, creatinine 1.2 => 1.33, BUN 56 Labs (1/14): LDL 85, HDL 86, K 3.9, creatinine 1.0, BNP 447 Labs (4/14): K 4.1, creatinine 0.9, BNP 293, LFTs normal, TSH normal Labs (8/14): K 4.3, creatinine 1.2, LFTs normal, TSH normal Labs (12/14): LFTs normal, TSH normal Labs (3/15): K 4.5, creatinine 1.3, HCT 36.2 Labs (10/15): K 5.1, creatinine 0.9, LFTs normal, TSH normal, pBNP 1893 Labs (3/16): K 3.9, creatinine 1.03, HCT 36.9, LFTs normal Labs (7/16): K 4, creatinine 0.94, calcium 10.8, LFTs normal Labs (9/16): K 4, creatinine 0.9, LDL 69, TSH normal, HCT 42.3  PMH: 1. Atrial fibrillation: Diagnosed initially in 10/13.  Holter monitor in 11/13 showed atrial fibrillation with average rate 65.  Back in NSR on amiodarone.   2. Chronic diastolic CHF: Echo (29/93) with EF 65-70%, mild MR, moderate biatrial enlargement, moderate TR, PA systolic pressure 45 mmHg.   Echo (10/15): EF 60-65%.  Echo (7/16) with EF 60-65%, mild MR.  3. ETT-Sestamibi (11/13): Exercised to stage II, small  partially reversible anteroapical perfusion defect suggesting mild ischemia versus attenuation, EF 80%.  Cardiolite (7/16) with EF 64%, no ischemia or infarction.  4. HTN: Lower extremity swelling with amlodipine.  5. Polycythemia vera: Has had phlebotomy only once.  6. Hypothyroidism 7. Macular degeneration 8. TAH 1980 9. Familial hypocalciuric hypercalcemia 10. PFTs (amiodarone use) in 1/14 showed a mild obstructive defect.  11. Degenerative disc disease  SH: Prior smoker (years ago).  Married, lived in [place] but now at [place]  Occasional ETOH.  Daughter works for [employer].   FH: CAD  ROS: All systems reviewed and negative except as per HPI.   Current Outpatient Prescriptions  Medication Sig Dispense Refill  . acetaminophen (TYLENOL) 650 MG CR tablet Take 650 mg by mouth 3 (three) times daily.    [name] Kitchen amiodarone (PACERONE) 100 MG tablet Take 1 tablet (100 mg total) by mouth daily.    . Biotin 5000 MCG TABS Take 5,000 mcg by mouth daily.    . cholecalciferol (VITAMIN D) 1000 UNITS tablet Take 2,000 Units by mouth daily.     . fentaNYL (DURAGESIC - DOSED MCG/HR) 12 MCG/HR Place 1 patch (12.5 mcg total) onto the skin every 3 (three) days. 10 patch 0  . furosemide (LASIX) 40 MG tablet Take by mouth. 1.5 tablets (60mg ) two times a day    . gabapentin (NEURONTIN) 100 MG capsule Take by mouth. 1-2 tablets at betime    . hydrALAZINE (APRESOLINE) 50 MG tablet  Take 1 tablet by mouth 3 (three) times daily.    . hydroxyurea (HYDREA) 500 MG capsule TAKE 2 CAPSULES ON MONDAYS AND TAKE 1 CAPSULE ALL OTHER DAYS OF THE WEEK. 35 capsule 6  . ibandronate (BONIVA) 150 MG tablet TAKE ONE TABLET ONCE A MONTH IN THE MORNING ON AN EMPTY STOMACH WITH WATER. REMAIN UPRIGHT. 1 tablet 6  . levothyroxine (SYNTHROID, LEVOTHROID) 112 MCG tablet TAKE 1 TABLET EACH DAY. 30 tablet 5  . Magnesium 250 MG TABS Take 250 mg by mouth daily.     . Melatonin 5 MG CAPS Take 5 mg by mouth at bedtime.    . Multiple  Vitamins-Minerals (PRESERVISION AREDS 2) CAPS Take 1 capsule by mouth 2 (two) times daily.    . potassium chloride (K-DUR) 10 MEQ tablet TAKE 2 TABLETS DAILY. 60 tablet 0  . senna-docusate (SENOKOT-S) 8.6-50 MG per tablet Take 2 tablets by mouth 2 (two) times daily. 120 tablet 3  . traMADol (ULTRAM) 50 MG tablet Take one to two tablets by mouth every 6 hours as needed for pain 60 tablet 0  . XARELTO 15 MG TABS tablet TAKE 1 TABLET ONCE DAILY. (Patient taking differently: TAKE 1 TABLET ONCE DAILY WITH SUPPER) 30 tablet 11  . polyethylene glycol (MIRALAX / GLYCOLAX) packet Take 17 g by mouth daily as needed (constipation).      No current facility-administered medications for this visit.    BP 98/62 mmHg  Pulse 48  Ht 5\' 2"  (1.575 m)  Wt 114 lb (51.71 kg)  BMI 20.85 kg/m2  SpO2 95% General: NAD Neck: JVP 7 cm, no thyromegaly or thyroid nodule.  Lungs: CTAB CV: Nondisplaced PMI.  Heart regular S1/S2, no S3/S4, 1/6 early SEM.  No edema.  No carotid bruit.  Abdomen: Soft, nontender, no hepatosplenomegaly, no distention.  Neurologic: Alert and oriented x 3.  Psych: Normal affect. Extremities: No clubbing or cyanosis.   Assessment/Plan:  1. Atrial fibrillation: First noted in 10/13.  Atrial fibrillation has triggered acute on chronic diastolic CHF in the past.  CHADSVASC score is 70 (age, female gender, HTN, CHF).  She is anticoagulated with Xarelto 15 mg daily.  She is off nebivolol due to bradycardia.  She remains in NSR today.  - Given low HR, decrease amiodarone to 100 mg daily.  Recent TSH normal.  Check LFTs today.  Given amiodarone use, she will need yearly eye exams.  Baseline PFTs showed a mild obstructive defect.   2. CAD: No further chest pain.  7/16 Cardiolite showed no ischemia or infarction.  3. Chronic diastolic CHF:  NYHA class II symptoms. Volume looks ok on current Lasix.  -Continue Lasix 60 mg bid.   4. HTN: BP controlled on current regimen.   5. Bradycardia: HR 48 in  office today.  Will decrease amiodarone to 100 mg daily.  6. Hypercalcemia: Mild.  Has diagnosis of familial hypocalciuric hypercalcemia.   Followup 6 months.   Loralie Champagne 11/30/2014

## 2014-12-04 ENCOUNTER — Other Ambulatory Visit: Payer: Self-pay | Admitting: Internal Medicine

## 2014-12-04 DIAGNOSIS — J0100 Acute maxillary sinusitis, unspecified: Secondary | ICD-10-CM

## 2014-12-04 MED ORDER — AMOXICILLIN-POT CLAVULANATE 875-125 MG PO TABS: 1.0000 | ORAL_TABLET | Freq: Two times a day (BID) | ORAL | Status: DC

## 2014-12-04 NOTE — Progress Notes (Signed)
Pt was seen by clinic nurse at [facility] due to cough with yellow thick mucus for 2 days, nasal stuffiness/discomfort, sore throat due to coughing, and chills last night.  VS:  T 99.8 HR 67 RR 20 BP 138/64.  There are no openings in the office thru Friday of this week so she is requesting abx be called in.  Recommended coricidin bp cough syrup 2tsp q 6 hrs as needed for cough, saline nasal spray or nettipot for congestions, warm humidity and augmentin bid for 10 days.  She should eat yogurt daily or take florastor 250mg  po bid (probiotic).

## 2014-12-12 ENCOUNTER — Other Ambulatory Visit: Payer: Self-pay | Admitting: Cardiology

## 2014-12-16 ENCOUNTER — Other Ambulatory Visit: Payer: Self-pay | Admitting: Cardiology

## 2014-12-18 ENCOUNTER — Telehealth: Payer: Self-pay

## 2014-12-18 ENCOUNTER — Other Ambulatory Visit: Payer: Self-pay | Admitting: Internal Medicine

## 2014-12-18 DIAGNOSIS — M5440 Lumbago with sciatica, unspecified side: Secondary | ICD-10-CM

## 2014-12-18 MED ORDER — FENTANYL 12 MCG/HR TD PT72: 12.0000 ug | MEDICATED_PATCH | TRANSDERMAL | Status: DC

## 2014-12-18 NOTE — Telephone Encounter (Signed)
Message left informing patient rx will be ready for pick-up at [facility].

## 2014-12-18 NOTE — Telephone Encounter (Signed)
I will write out the prescription today.  Please inform her to come pick it up at the front desk in health care.

## 2014-12-18 NOTE — Telephone Encounter (Signed)
Patient called and left message on triage voicemail for fentanyl patch.   I called patient and informed her that last refill was 11/19/14 and we will have RX available for pick-up on Friday 12/20/14. Patient states this is a very inconvieint process for her. Patient states Dr.Reed is at [place] now and she would like for her to refill medication today. Patient placed her last patch today and will not fill rx until Saturday 12/21/14 when she is due for next patch placement.  I explained newly developed controlled substance process that states RX will not be given earlier than due and patient again state this is not convient and I need to speak with Dr.Reed about her request.  Please advise

## 2014-12-23 DIAGNOSIS — L659 Nonscarring hair loss, unspecified: Secondary | ICD-10-CM | POA: Diagnosis not present

## 2014-12-25 ENCOUNTER — Other Ambulatory Visit: Payer: Self-pay | Admitting: Cardiology

## 2014-12-26 DIAGNOSIS — L659 Nonscarring hair loss, unspecified: Secondary | ICD-10-CM | POA: Diagnosis not present

## 2014-12-26 DIAGNOSIS — Z79899 Other long term (current) drug therapy: Secondary | ICD-10-CM | POA: Diagnosis not present

## 2014-12-30 DIAGNOSIS — H353232 Exudative age-related macular degeneration, bilateral, with inactive choroidal neovascularization: Secondary | ICD-10-CM | POA: Diagnosis not present

## 2014-12-30 DIAGNOSIS — H353134 Nonexudative age-related macular degeneration, bilateral, advanced atrophic with subfoveal involvement: Secondary | ICD-10-CM | POA: Diagnosis not present

## 2014-12-30 DIAGNOSIS — H35363 Drusen (degenerative) of macula, bilateral: Secondary | ICD-10-CM | POA: Diagnosis not present

## 2015-01-15 ENCOUNTER — Other Ambulatory Visit: Payer: Self-pay

## 2015-01-15 DIAGNOSIS — M5440 Lumbago with sciatica, unspecified side: Secondary | ICD-10-CM

## 2015-01-15 MED ORDER — FENTANYL 12 MCG/HR TD PT72: 12.5000 ug | MEDICATED_PATCH | TRANSDERMAL | Status: DC

## 2015-01-15 NOTE — Telephone Encounter (Signed)
Patient called [facility] clinic nurse to ask for new Fentanyl Patch, last 12/18/14, doesn't want to go downtown to get it Friday 12/18/14. Dr. Mariea Clonts ok early Rx. Written given to [facility] to give to patient

## 2015-01-20 ENCOUNTER — Other Ambulatory Visit: Payer: Self-pay | Admitting: Internal Medicine

## 2015-02-04 ENCOUNTER — Telehealth: Payer: Self-pay | Admitting: *Deleted

## 2015-02-04 NOTE — Telephone Encounter (Signed)
Patient called and stated that she is in a lot of pain and would like her Fentanyl Patch changed back to the stronger dose. She is currently taking Fentanyl 79mcg and wants it changed back to Fentanyl 28mcg. Please Advise.

## 2015-02-05 NOTE — Telephone Encounter (Signed)
Patient called today regarding message. Resent.

## 2015-02-06 NOTE — Telephone Encounter (Signed)
Is she due for renewal?

## 2015-02-06 NOTE — Telephone Encounter (Signed)
Pt called back, told her per Dr. Mariea Clonts comment below.... Pt confirmed  Next appointment.  Sarah Mays

## 2015-02-06 NOTE — Telephone Encounter (Signed)
She is due by next Friday, but she wanted to get it at [facility] on Wednesday.

## 2015-02-06 NOTE — Telephone Encounter (Signed)
Ok, will make change for next Wednesday and give script during regular clinic hours Wednesday.

## 2015-02-12 ENCOUNTER — Non-Acute Institutional Stay: Payer: Medicare Other | Admitting: Internal Medicine

## 2015-02-12 ENCOUNTER — Encounter: Payer: Self-pay | Admitting: Internal Medicine

## 2015-02-12 VITALS — BP 128/74 | HR 68 | Temp 98.3°F | Ht 62.0 in | Wt 108.0 lb

## 2015-02-12 DIAGNOSIS — K5903 Drug induced constipation: Secondary | ICD-10-CM | POA: Diagnosis not present

## 2015-02-12 DIAGNOSIS — G4700 Insomnia, unspecified: Secondary | ICD-10-CM | POA: Diagnosis not present

## 2015-02-12 DIAGNOSIS — M5441 Lumbago with sciatica, right side: Secondary | ICD-10-CM | POA: Diagnosis not present

## 2015-02-12 DIAGNOSIS — M5440 Lumbago with sciatica, unspecified side: Secondary | ICD-10-CM

## 2015-02-12 NOTE — Progress Notes (Signed)
Patient ID: Sarah Mays, female   DOB: 1930-04-26, 82 y.o.   MRN: [id number]   Location: [facility] Provider: Aniza Shor L. Mariea Clonts, D.O., C.M.D.  Code Status: DNR Goals of Care: Advanced Directive information Does patient have an advance directive?: Yes, Type of Advance Directive: [place];Living will  Chief Complaint  Patient presents with  . Medical Management of Chronic Issues    back pain, gotten worse for 2 weeks. Has appt with Dr. Mina Marble next week. Taking Tramadol 2 in morning, one at bedtime.    HPI: Patient is a 82 y.o. female seen in the [facility] today for acute for back pain that has gotten worse. Was taking her tylenol with just one tramadol, but now increased that.  Does not sleep well.  Pain is worse at night.  Right lower leg hurts and shoots up and low back aches.  Considered increasing fentanyl--wants to know if I recommend it at least until she sees Dr. Mina Marble again.  Two tramadol caused her to climb the walls.    Insomnia:  Takes the melatonin in case sleep would be worse w/o it, but it does not really do a lot she thinks.  Might need ablation.   Dr. Mina Marble appt coming up.    Constipation:  Bowels move with stool softener and miralax.  Due to pain meds.    Review of Systems:  Review of Systems  Constitutional: Negative for fever and chills.  HENT: Positive for hearing loss.   Eyes:       Macular degeneration  Respiratory: Negative for shortness of breath.   Cardiovascular: Negative for chest pain.  Gastrointestinal: Negative for abdominal pain.  Genitourinary: Negative for dysuria.  Musculoskeletal: Positive for back pain. Negative for falls.  Neurological: Positive for tingling and sensory change. Negative for weakness.  Psychiatric/Behavioral: Negative for memory loss. The patient has insomnia.     Past Medical History  Diagnosis Date  . PAF (paroxysmal atrial fibrillation) (HCC)     a. on amio (02/2012 mild obstruction on PFT's) and  xarelto.  . Essential hypertension   . Mitral valve disorders   . Tricuspid valve disorders, specified as nonrheumatic   . Neck mass     right, w/u including MRI negative  . Hypothyroidism   . Macular degeneration     Left, s/p inj. tx  . Senile osteoporosis     Reclast in the past  . Alopecia, unspecified   . Pure hypercholesterolemia   . Chronic diastolic CHF (congestive heart failure) ([place])     a. 11/2013 Echo: EF 60-65%, no rwma, mild TR, PASP 68mmHg.  [name] Kitchen Headache(784.0)   . Backache, unspecified   . Polycythemia vera(238.4)   . Gouty arthropathy, unspecified   . Insomnia, unspecified   . Other malaise and fatigue   . Hyposmolality and/or hyponatremia   . Hyperpotassemia   . Edema   . Closed fracture of lower end of radius with ulna   . Hypercalcemia   . Deafness     Left, s/p multiple surgeries  . Abnormal stress test     a. 11/2011 Ex MV: EF 80%, small, partially reversible anteroapical defect->mild ischemia vs attenuation-->med Rx.    Past Surgical History  Procedure Laterality Date  . Breast biopsy  06/18/1998    left  . Total abdominal hysterectomy    . Tonsillectomy    . Breast biopsy    . Tympanoplasty Bilateral   . Ablation  08/28/14    Back  Allergies  Allergen Reactions  . Amlodipine Swelling    Lower extremity edema       Medication List       This list is accurate as of: 02/12/15  4:55 PM.  Always use your most recent med list.               acetaminophen 650 MG CR tablet  Commonly known as:  TYLENOL  Take 650 mg by mouth 3 (three) times daily.     amiodarone 100 MG tablet  Commonly known as:  PACERONE  Take 1 tablet (100 mg total) by mouth daily.     amoxicillin-clavulanate 875-125 MG tablet  Commonly known as:  AUGMENTIN  Take 1 tablet by mouth 2 (two) times daily.     Biotin 5000 MCG Tabs  Take 5,000 mcg by mouth daily.     cholecalciferol 1000 units tablet  Commonly known as:  VITAMIN D  Take 2,000 Units by mouth daily.       fentaNYL 12 MCG/HR  Commonly known as:  DURAGESIC - dosed mcg/hr  Place 1 patch (12.5 mcg total) onto the skin every 3 (three) days.     furosemide 40 MG tablet  Commonly known as:  LASIX  Take by mouth. 1.5 tablets (60mg ) two times a day     gabapentin 100 MG capsule  Commonly known as:  NEURONTIN  Take by mouth. 1-2 tablets at betime     hydrALAZINE 50 MG tablet  Commonly known as:  APRESOLINE  TAKE 1 1/2 TABLETS THREE TIMES DAILY.     hydroxyurea 500 MG capsule  Commonly known as:  HYDREA  TAKE 2 CAPSULES ON MONDAYS AND TAKE 1 CAPSULE ALL OTHER DAYS OF THE WEEK.     ibandronate 150 MG tablet  Commonly known as:  BONIVA  TAKE ONE TABLET ONCE A MONTH IN THE MORNING ON AN EMPTY STOMACH WITH WATER. REMAIN UPRIGHT.     levothyroxine 112 MCG tablet  Commonly known as:  SYNTHROID, LEVOTHROID  TAKE 1 TABLET EACH DAY.     Magnesium 250 MG Tabs  Take 250 mg by mouth daily.     Melatonin 5 MG Caps  Take 5 mg by mouth at bedtime.     polyethylene glycol packet  Commonly known as:  MIRALAX / GLYCOLAX  Take 17 g by mouth daily as needed (constipation).     potassium chloride 10 MEQ tablet  Commonly known as:  K-DUR  Take 2 tablets (20 mEq total) by mouth daily.     PRESERVISION AREDS 2 Caps  Take 1 capsule by mouth 2 (two) times daily.     senna-docusate 8.6-50 MG tablet  Commonly known as:  Senokot-S  Take 2 tablets by mouth 2 (two) times daily.     traMADol 50 MG tablet  Commonly known as:  ULTRAM  TAKE 1 OR 2 TABLETS EVERY 6 HOURS AS NEEDED FOR PAIN     XARELTO 15 MG Tabs tablet  Generic drug:  Rivaroxaban  TAKE 1 TABLET ONCE DAILY.        Health Maintenance  Topic Date Due  . TETANUS/TDAP  08/03/1951  . ZOSTAVAX  08/02/1992  . INFLUENZA VACCINE  09/09/2015  . DEXA SCAN  Completed  . PNA vac Low Risk Adult  Completed    Physical Exam: Filed Vitals:   02/12/15 1639  BP: 128/74  Pulse: 68  Temp: 98.3 F (36.8 C)  TempSrc: Oral  Height: 5\' 2"   (1.575 m)  Weight: 108  lb (48.988 kg)  SpO2: 95%   Body mass index is 19.75 kg/(m^2). Physical Exam  Constitutional: She is oriented to person, place, and time. She appears well-developed and well-nourished.  Cardiovascular: Normal rate and regular rhythm.   Murmur heard. Pulmonary/Chest: Effort normal and breath sounds normal. No respiratory distress.  Abdominal: Bowel sounds are normal.  Musculoskeletal: She exhibits tenderness. She exhibits no edema.  Lumbar spine and sacroiliac region tender; no reproducible tenderness in right lower leg; walks with rollator walker  Neurological: She is alert and oriented to person, place, and time.  Skin: Skin is warm and dry.  Psychiatric: She has a normal mood and affect.    Labs reviewed: Basic Metabolic Panel:  Recent Labs  05/15/14 1540  08/15/14 1651  09/02/14 0641 09/25/14 1151 10/29/14 11/29/14 1518  NA  --   < >  --   < > 137 136 137 138  K  --   < >  --   < > 3.4* 4.1 4.0 3.8  CL  --   < >  --   < > 101 98  --  99  CO2  --   < >  --   < > 28 31  --  26  GLUCOSE  --   < >  --   < > 90 70  --  64*  BUN  --   < >  --   < > 21* 16 22* 26*  CREATININE  --   < >  --   < > 0.86 0.83 0.9 0.87  CALCIUM  --   < >  --   < > 9.9 10.5  --  10.6*  TSH 1.10  --  1.14  --   --   --  0.72  --   < > = values in this interval not displayed. Liver Function Tests:  Recent Labs  09/01/14 1945 09/02/14 0641 11/29/14 1518  AST 22 20 22   ALT 20 17 17   ALKPHOS 59 52 61  BILITOT 0.5 0.9 0.7  PROT 6.2* 5.9* 6.8  ALBUMIN 3.9 3.8 4.3   No results for input(s): LIPASE, AMYLASE in the last 8760 hours. No results for input(s): AMMONIA in the last 8760 hours. CBC:  Recent Labs  09/01/14 1945 09/02/14 0641 10/29/14 11/07/14 1247  WBC 16.2* 12.4* 8.3 8.3  NEUTROABS 13.1* 9.9*  --  5.7  HGB 13.1 13.0 13.7 14.1  HCT 39.5 39.1 40 42.3  MCV 115.8* 115.7*  --  113.1*  PLT 469* 425* 384 382   Lipid Panel:  Recent Labs  10/29/14  CHOL 164    HDL 78*  LDLCALC 69  TRIG 133   No results found for: HGBA1C   Assessment/Plan 1. Midline low back pain with right-sided sciatica -worse -agreed to increase fentanyl patch to 93mcg to improve pain relief especially so she can sleep at night until possible repeat ablation procedure -new Rx was provided during visit today -initially got relief, but then seems to have develops new region or recurrence of her pain down her right leg -also continue tramadol for breakthrough, but does not work well for her at night--says it makes her climb the walls   2. Constipation due to pain medication -cont senna and miralax, adequate water intake and physical activity as tolerates--does not need excessive amts of these meds so will not switch to daily Rx pill instead  3. Insomnia -cont melatonin at bedtime which may be helping some with her  sleep  Labs/tests ordered:  No new Next appt:  05/07/15   Sarah Mays L. Hondo Nanda, D.O. [facility] [address], [place] [address] Cell Phone (Mon-Fri 8am-5pm):  [phone] On Call:  [phone] & follow prompts after 5pm & weekends Office Phone:  [phone] Office Fax:  [phone]

## 2015-02-21 DIAGNOSIS — M47816 Spondylosis without myelopathy or radiculopathy, lumbar region: Secondary | ICD-10-CM | POA: Diagnosis not present

## 2015-02-22 ENCOUNTER — Other Ambulatory Visit: Payer: Self-pay | Admitting: Internal Medicine

## 2015-02-23 MED ORDER — FENTANYL 25 MCG/HR TD PT72: 25.0000 ug | MEDICATED_PATCH | TRANSDERMAL | Status: DC

## 2015-02-27 ENCOUNTER — Other Ambulatory Visit: Payer: Self-pay | Admitting: Family Medicine

## 2015-02-27 NOTE — Telephone Encounter (Signed)
Refill done.  

## 2015-03-05 ENCOUNTER — Encounter: Payer: Self-pay | Admitting: Internal Medicine

## 2015-03-06 ENCOUNTER — Other Ambulatory Visit: Payer: Self-pay | Admitting: Internal Medicine

## 2015-03-07 ENCOUNTER — Telehealth: Payer: Self-pay | Admitting: Cardiology

## 2015-03-07 NOTE — Telephone Encounter (Signed)
error 

## 2015-03-12 ENCOUNTER — Other Ambulatory Visit: Payer: Self-pay

## 2015-03-12 DIAGNOSIS — M5440 Lumbago with sciatica, unspecified side: Secondary | ICD-10-CM

## 2015-03-12 MED ORDER — FENTANYL 25 MCG/HR TD PT72: 25.0000 ug | MEDICATED_PATCH | TRANSDERMAL | Status: DC

## 2015-03-12 NOTE — Telephone Encounter (Signed)
Message from [place], patient called her to ask for Fentanyl Patch Rx, last one 02/12/15. Hand written Rx , given to [facility] to give to patient.

## 2015-03-19 ENCOUNTER — Telehealth: Payer: Self-pay | Admitting: Cardiology

## 2015-03-19 NOTE — Telephone Encounter (Signed)
I will forward this message to Webb Silversmith and Dr Aundra Dubin to review.

## 2015-03-19 NOTE — Telephone Encounter (Signed)
New Message  Request for surgical clearance:  1. What type of surgery is being performed? Ablation   2. When is this surgery scheduled? 03/27/2015   3. Are there any medications that need to be held prior to surgery and how long? Xarelto 2 days before   4. Name of physician performing surgery? Dr. Mina Marble   5. What is your office phone and fax number? [phone] phone  And [phone] fax  Comments: Pt called to follow up on surgical clearance that was faxedin Jan. Dr. Cipriano Mile office has not received this fax back. Please call back to discuss.  6.

## 2015-03-20 NOTE — Telephone Encounter (Signed)
OK to hold Xarelto for 2 days for ablation procedure.

## 2015-03-20 NOTE — Telephone Encounter (Signed)
Pt was informed/ copy made and taken to MR to be faxed.

## 2015-03-24 DIAGNOSIS — M47816 Spondylosis without myelopathy or radiculopathy, lumbar region: Secondary | ICD-10-CM | POA: Diagnosis not present

## 2015-03-25 ENCOUNTER — Other Ambulatory Visit: Payer: Self-pay | Admitting: *Deleted

## 2015-03-25 ENCOUNTER — Other Ambulatory Visit: Payer: Self-pay | Admitting: Cardiology

## 2015-03-25 MED ORDER — TRAMADOL HCL 50 MG PO TABS: ORAL_TABLET | ORAL | Status: DC

## 2015-03-25 NOTE — Telephone Encounter (Signed)
Bernadette with [facility] called and stated that patient needed a refill on medication. Printed and faxed to [facility]. Left message on patient's voicemail.

## 2015-03-27 DIAGNOSIS — M47816 Spondylosis without myelopathy or radiculopathy, lumbar region: Secondary | ICD-10-CM | POA: Diagnosis not present

## 2015-04-14 ENCOUNTER — Other Ambulatory Visit: Payer: Self-pay | Admitting: *Deleted

## 2015-04-14 DIAGNOSIS — M47816 Spondylosis without myelopathy or radiculopathy, lumbar region: Secondary | ICD-10-CM | POA: Diagnosis not present

## 2015-04-14 DIAGNOSIS — M7061 Trochanteric bursitis, right hip: Secondary | ICD-10-CM | POA: Diagnosis not present

## 2015-04-14 DIAGNOSIS — M5440 Lumbago with sciatica, unspecified side: Secondary | ICD-10-CM

## 2015-04-14 MED ORDER — FENTANYL 25 MCG/HR TD PT72: 25.0000 ug | MEDICATED_PATCH | TRANSDERMAL | Status: DC

## 2015-04-14 NOTE — Telephone Encounter (Signed)
Patient requested and to take out to [facility] on Wednesday.

## 2015-04-28 ENCOUNTER — Other Ambulatory Visit: Payer: Self-pay | Admitting: Nurse Practitioner

## 2015-04-28 DIAGNOSIS — R1011 Right upper quadrant pain: Secondary | ICD-10-CM

## 2015-04-29 ENCOUNTER — Ambulatory Visit
Admission: RE | Admit: 2015-04-29 | Discharge: 2015-04-29 | Disposition: A | Discharge status: Home / Self Care | Payer: Medicare Other | Source: Ambulatory Visit | Attending: Nurse Practitioner | Admitting: Nurse Practitioner

## 2015-04-29 DIAGNOSIS — K8020 Calculus of gallbladder without cholecystitis without obstruction: Secondary | ICD-10-CM | POA: Diagnosis not present

## 2015-04-29 DIAGNOSIS — R1011 Right upper quadrant pain: Secondary | ICD-10-CM

## 2015-04-30 ENCOUNTER — Ambulatory Visit: Payer: Medicare Other | Admitting: Internal Medicine

## 2015-04-30 ENCOUNTER — Encounter: Payer: Self-pay | Admitting: Internal Medicine

## 2015-04-30 VITALS — BP 140/68 | HR 102 | Temp 98.5°F | Ht 62.0 in | Wt 114.0 lb

## 2015-04-30 DIAGNOSIS — R1011 Right upper quadrant pain: Secondary | ICD-10-CM | POA: Diagnosis not present

## 2015-04-30 DIAGNOSIS — K8020 Calculus of gallbladder without cholecystitis without obstruction: Secondary | ICD-10-CM

## 2015-04-30 DIAGNOSIS — Q639 Congenital malformation of kidney, unspecified: Secondary | ICD-10-CM

## 2015-04-30 NOTE — Patient Instructions (Signed)

## 2015-04-30 NOTE — Progress Notes (Signed)
Patient ID: Sarah Mays, female   DOB: 1930-07-05, 82 y.o.   MRN: [id number]   Location: [facility] (12)  Provider: Palmer Shorey L. Mariea Mays, D.O., C.M.D.  Code Status: DNR Goals of Care:  Advanced Directives 04/30/2015  Does patient have an advance directive? Yes  Type of Paramedic of [place];Living will  Copy of advanced directive(s) in chart? Yes   Chief Complaint  Patient presents with  . Acute Visit    discuss ultrasound, right quad pain    HPI: Patient is a 82 y.o. [place] resident of [place] seen today for an acute visit to discuss her RUQ ultrasound she had due to abdominal pain in that area.  She continues to have right upper quadrant abdominal pain.  Pain began late Friday.  Sarah Mays advised for her to go to urgent care on a Monday.   Right upper quadrant around to the back.  Stays there.  Does not keep her up at night, but hurts all day.  Worse with moving.  Appetite is diminished.  No clear worsening after meals.    Past Medical History  Diagnosis Date  . PAF (paroxysmal atrial fibrillation) (HCC)     a. on amio (02/2012 mild obstruction on PFT's) and xarelto.  . Essential hypertension   . Mitral valve disorders   . Tricuspid valve disorders, specified as nonrheumatic   . Neck mass     right, w/u including MRI negative  . Hypothyroidism   . Macular degeneration     Left, s/p inj. tx  . Senile osteoporosis     Reclast in the past  . Alopecia, unspecified   . Pure hypercholesterolemia   . Chronic diastolic CHF (congestive heart failure) ([place])     a. 11/2013 Echo: EF 60-65%, no rwma, mild TR, PASP 107mmHg.  [name] Kitchen Headache(784.0)   . Backache, unspecified   . Polycythemia vera(238.4)   . Gouty arthropathy, unspecified   . Insomnia, unspecified   . Other malaise and fatigue   . Hyposmolality and/or hyponatremia   . Hyperpotassemia   . Edema   . Closed fracture of lower end of radius with ulna     . Hypercalcemia   . Deafness     Left, s/p multiple surgeries  . Abnormal stress test     a. 11/2011 Ex MV: EF 80%, small, partially reversible anteroapical defect->mild ischemia vs attenuation-->med Rx.    Past Surgical History  Procedure Laterality Date  . Breast biopsy  06/18/1998    left  . Total abdominal hysterectomy    . Tonsillectomy    . Breast biopsy    . Tympanoplasty Bilateral   . Ablation  08/28/14    Back     Allergies  Allergen Reactions  . Amlodipine Swelling    Lower extremity edema       Medication List       This list is accurate as of: 04/30/15  1:33 PM.  Always use your most recent med list.               acetaminophen 650 MG CR tablet  Commonly known as:  TYLENOL  Take 650 mg by mouth 3 (three) times daily.     amiodarone 100 MG tablet  Commonly known as:  PACERONE  Take 1 tablet (100 mg total) by mouth daily.     Biotin 5000 MCG Tabs  Take 5,000 mcg by mouth daily.     cholecalciferol 1000  units tablet  Commonly known as:  VITAMIN D  Take 2,000 Units by mouth daily.     fentaNYL 25 MCG/HR patch  Commonly known as:  DURAGESIC - dosed mcg/hr  Place 1 patch (25 mcg total) onto the skin every 3 (three) days.     furosemide 40 MG tablet  Commonly known as:  LASIX  Take by mouth. 1.5 tablets (60mg ) two times a day     gabapentin 100 MG capsule  Commonly known as:  NEURONTIN  TAKE 1 OR 2 CAPSULES AT BEDTIME.     hydrALAZINE 50 MG tablet  Commonly known as:  APRESOLINE  TAKE 1 1/2 TABLETS THREE TIMES DAILY.     hydroxyurea 500 MG capsule  Commonly known as:  HYDREA  TAKE 2 CAPSULES ON MONDAYS AND TAKE 1 CAPSULE ALL OTHER DAYS OF THE WEEK.     ibandronate 150 MG tablet  Commonly known as:  BONIVA  TAKE ONE TABLET ONCE A MONTH IN THE MORNING ON AN EMPTY STOMACH WITH WATER. REMAIN UPRIGHT.     levothyroxine 112 MCG tablet  Commonly known as:  SYNTHROID, LEVOTHROID  TAKE 1 TABLET EACH DAY.     Magnesium 250 MG Tabs  Take 250 mg  by mouth daily.     Melatonin 5 MG Caps  Take 5 mg by mouth at bedtime.     polyethylene glycol packet  Commonly known as:  MIRALAX / GLYCOLAX  Take 17 g by mouth daily as needed (constipation).     potassium chloride 10 MEQ tablet  Commonly known as:  K-DUR  Take 2 tablets (20 mEq total) by mouth daily.     PRESERVISION AREDS 2 Caps  Take 1 capsule by mouth 2 (two) times daily.     senna-docusate 8.6-50 MG tablet  Commonly known as:  Senokot-S  Take 2 tablets by mouth 2 (two) times daily.     traMADol 50 MG tablet  Commonly known as:  ULTRAM  Take one to two tablets by mouth every 6 hours as needed for pain     XARELTO 15 MG Tabs tablet  Generic drug:  Rivaroxaban  Take 15 mg by mouth daily with supper.        Review of Systems:  Review of Systems  Constitutional: Negative for fever, chills, activity change, appetite change and unexpected weight change.  Respiratory: Negative for chest tightness and shortness of breath.   Cardiovascular: Negative for chest pain, palpitations and leg swelling.  Gastrointestinal: Positive for abdominal pain. Negative for nausea, vomiting, diarrhea, constipation, blood in stool, abdominal distention, anal bleeding and rectal pain.       RUQ  Genitourinary: Negative for dysuria and hematuria.  Musculoskeletal: Positive for back pain and gait problem. Negative for myalgias, joint swelling, arthralgias, neck pain and neck stiffness.  Skin: Negative for color change.  Neurological: Negative for dizziness and weakness.  Psychiatric/Behavioral: The patient is not nervous/anxious.     Health Maintenance  Topic Date Due  . TETANUS/TDAP  08/03/1951  . ZOSTAVAX  08/02/1992  . INFLUENZA VACCINE  09/09/2015  . DEXA SCAN  Completed  . PNA vac Low Risk Adult  Completed    Physical Exam: Filed Vitals:   04/30/15 1326  BP: 140/68  Pulse: 102  Temp: 98.5 F (36.9 C)  TempSrc: Oral  Height: 5\' 2"  (1.575 m)  Weight: 114 lb (51.71 kg)  SpO2:  94%   Body mass index is 20.85 kg/(m^2). Physical Exam  Constitutional: She is oriented to person,  place, and time. She appears well-developed and well-nourished. No distress.  Cardiovascular: Normal rate, regular rhythm, normal heart sounds and intact distal pulses.   Pulmonary/Chest: Effort normal and breath sounds normal. No respiratory distress.  Abdominal: Soft. Bowel sounds are normal. She exhibits no distension and no mass. There is tenderness. There is no rebound and no guarding.  RUQ mildly tender and around to her back in that same region (lower thoracic area); no suprapubic or flank tenderness  Musculoskeletal: She exhibits tenderness.  Over lower back and SI joints (chronic), walks with rollator walker   Neurological: She is alert and oriented to person, place, and time.  Skin: Skin is warm and dry.    Labs reviewed: Basic Metabolic Panel:  Recent Labs  05/15/14 1540  08/15/14 1651  09/02/14 0641 09/25/14 1151 10/29/14 11/29/14 1518  NA  --   < >  --   < > 137 136 137 138  K  --   < >  --   < > 3.4* 4.1 4.0 3.8  CL  --   < >  --   < > 101 98  --  99  CO2  --   < >  --   < > 28 31  --  26  GLUCOSE  --   < >  --   < > 90 70  --  64*  BUN  --   < >  --   < > 21* 16 22* 26*  CREATININE  --   < >  --   < > 0.86 0.83 0.9 0.87  CALCIUM  --   < >  --   < > 9.9 10.5  --  10.6*  TSH 1.10  --  1.14  --   --   --  0.72  --   < > = values in this interval not displayed. Liver Function Tests:  Recent Labs  09/01/14 1945 09/02/14 0641 11/29/14 1518  AST 22 20 22   ALT 20 17 17   ALKPHOS 59 52 61  BILITOT 0.5 0.9 0.7  PROT 6.2* 5.9* 6.8  ALBUMIN 3.9 3.8 4.3   No results for input(s): LIPASE, AMYLASE in the last 8760 hours. No results for input(s): AMMONIA in the last 8760 hours. CBC:  Recent Labs  09/01/14 1945 09/02/14 0641 10/29/14 11/07/14 1247  WBC 16.2* 12.4* 8.3 8.3  NEUTROABS 13.1* 9.9*  --  5.7  HGB 13.1 13.0 13.7 14.1  HCT 39.5 39.1 40 42.3  MCV 115.8*  115.7*  --  113.1*  PLT 469* 425* 384 382   Lipid Panel:  Recent Labs  10/29/14  CHOL 164  HDL 78*  LDLCALC 69  TRIG 133   Procedures since last visit: US Abdomen Limited Ruq  04/29/2015  CLINICAL DATA:  Right upper quadrant pain for 5 days. EXAM: US ABDOMEN LIMITED - RIGHT UPPER QUADRANT COMPARISON:  MRI lumbar spine 04/15/2014 FINDINGS: Gallbladder: Gallbladder is moderately distended without wall thickening. There are small echogenic mobile structures in the gallbladder, largest measures up to 0.4 cm. Findings probably represent small stones. Common bile duct: Diameter: 0.2 cm. Liver: No focal lesion identified. Within normal limits in parenchymal echogenicity. Main portal vein is patent with normal direction of flow. There is fullness of the right renal pelvis and right renal calices. The renal collecting system fullness appears similar from the MRI of the lumbar spine on 04/15/2014. IMPRESSION: Gallbladder is moderately distended without wall thickening. Evidence for small gallstones. Fullness of the  right renal collecting system is probably chronic based on prior lumbar spine MRI. Electronically Signed   By: Markus Daft M.D.   On: 04/29/2015 17:18   Assessment/Plan 1. Calculus of gallbladder without cholecystitis without obstruction - noted on RUQ [place]  - Ambulatory referral to General Surgery, Dr. Donne Hazel - small stones, does not appear these are obstructive per report  2. Abdominal pain, right upper quadrant -suspect due to #1, but stones are small and  - Ambulatory referral to General Surgery  3. Structural abnormality of kidney -right renal pelvis and calices appear full (radiology report shows this is present since at least 04/15/14)   -will evaluate further after gall stones are addressed  Labs/tests ordered:   Orders Placed This Encounter  Procedures  . Ambulatory referral to General Surgery    Referral Priority:  Urgent    Referral Type:  Surgical    Referral Reason:   Specialty Services Required    Requested Specialty:  General Surgery    Number of Visits Requested:  1   Next appt:  05/07/2015  Latashia Koch L. Obbie Lewallen, D.O. [facility] [address],  [address] Cell Phone (Mon-Fri 8am-5pm):  [phone] On Call:  [phone] & follow prompts after 5pm & weekends Office Phone:  [phone] Office Fax:  [phone]

## 2015-05-07 ENCOUNTER — Encounter: Payer: Self-pay | Admitting: Internal Medicine

## 2015-05-07 ENCOUNTER — Non-Acute Institutional Stay: Payer: Medicare Other | Admitting: Internal Medicine

## 2015-05-07 VITALS — BP 112/62 | HR 64 | Temp 98.1°F | Ht 62.0 in | Wt 116.0 lb

## 2015-05-07 DIAGNOSIS — I480 Paroxysmal atrial fibrillation: Secondary | ICD-10-CM

## 2015-05-07 DIAGNOSIS — Q639 Congenital malformation of kidney, unspecified: Secondary | ICD-10-CM | POA: Diagnosis not present

## 2015-05-07 DIAGNOSIS — E034 Atrophy of thyroid (acquired): Secondary | ICD-10-CM | POA: Diagnosis not present

## 2015-05-07 DIAGNOSIS — M5441 Lumbago with sciatica, right side: Secondary | ICD-10-CM | POA: Diagnosis not present

## 2015-05-07 DIAGNOSIS — E038 Other specified hypothyroidism: Secondary | ICD-10-CM | POA: Diagnosis not present

## 2015-05-07 DIAGNOSIS — K8020 Calculus of gallbladder without cholecystitis without obstruction: Secondary | ICD-10-CM

## 2015-05-07 DIAGNOSIS — E2839 Other primary ovarian failure: Secondary | ICD-10-CM | POA: Diagnosis not present

## 2015-05-07 NOTE — Progress Notes (Signed)
Patient ID: Sarah Mays, female   DOB: Sep 25, 1930, 82 y.o.   MRN: [id number]   Location:  [facility] (12)  Provider: Nou Chard L. Mariea Clonts, D.O., C.M.D.  Goals of Care:  Advanced Directives 05/07/2015  Does patient have an advance directive? Yes  Type of Paramedic of [place];Living will  Copy of advanced directive(s) in chart? Yes     Chief Complaint  Patient presents with  . Medical Management of Chronic Issues    medication management, blood pressure, thyroid, pain    HPI: Patient is a 82 y.o. female seen today for medical management of chronic diseases.    Has not been to surgery yet for appt.  Is going to Dr. Johney Maine on Tuesday which was the soonest they could get in.  Pain is ok.  Same.    Back:  Better than it was originally, but pain not gone.  Ablations x 2 have helped.  Fentanyl 23mcg/hr since just before the last ablation.  Every now and then, she doesn't need any tramadol at bedtime.  Had two nights where she didn't need it at all.    Sees Dr. Alvy Bimler this Friday.  Has labs before.  Afib:  Can never tell if she's in this.  No problem with her xarelto  Last TSH wnl in Sept.    Previously took boniva for her osteopenia, but has not had bone density in over 2 years.    Past Medical History  Diagnosis Date  . PAF (paroxysmal atrial fibrillation) (HCC)     a. on amio (02/2012 mild obstruction on PFT's) and xarelto.  . Essential hypertension   . Mitral valve disorders   . Tricuspid valve disorders, specified as nonrheumatic   . Neck mass     right, w/u including MRI negative  . Hypothyroidism   . Macular degeneration     Left, s/p inj. tx  . Senile osteoporosis     Reclast in the past  . Alopecia, unspecified   . Pure hypercholesterolemia   . Chronic diastolic CHF (congestive heart failure) ([employer])     a. 11/2013 Echo: EF 60-65%, no rwma, mild TR, PASP 46mmHg.  [name] Kitchen Headache(784.0)   .  Backache, unspecified   . Polycythemia vera(238.4)   . Gouty arthropathy, unspecified   . Insomnia, unspecified   . Other malaise and fatigue   . Hyposmolality and/or hyponatremia   . Hyperpotassemia   . Edema   . Closed fracture of lower end of radius with ulna   . Hypercalcemia   . Deafness     Left, s/p multiple surgeries  . Abnormal stress test     a. 11/2011 Ex MV: EF 80%, small, partially reversible anteroapical defect->mild ischemia vs attenuation-->med Rx.    Past Surgical History  Procedure Laterality Date  . Breast biopsy  06/18/1998    left  . Total abdominal hysterectomy    . Tonsillectomy    . Breast biopsy    . Tympanoplasty Bilateral   . Ablation  08/28/14    Back     Allergies  Allergen Reactions  . Amlodipine Swelling    Lower extremity edema       Medication List       This list is accurate as of: 05/07/15  2:39 PM.  Always use your most recent med list.               acetaminophen 650 MG CR tablet  Commonly  known as:  TYLENOL  Take 650 mg by mouth 3 (three) times daily.     amiodarone 100 MG tablet  Commonly known as:  PACERONE  Take 1 tablet (100 mg total) by mouth daily.     Biotin 5000 MCG Tabs  Take 5,000 mcg by mouth daily.     cholecalciferol 1000 units tablet  Commonly known as:  VITAMIN D  Take 2,000 Units by mouth daily.     fentaNYL 25 MCG/HR patch  Commonly known as:  DURAGESIC - dosed mcg/hr  Place 1 patch (25 mcg total) onto the skin every 3 (three) days.     furosemide 40 MG tablet  Commonly known as:  LASIX  Take by mouth. 1.5 tablets (60mg ) two times a day     gabapentin 100 MG capsule  Commonly known as:  NEURONTIN  TAKE 1 OR 2 CAPSULES AT BEDTIME.     hydrALAZINE 50 MG tablet  Commonly known as:  APRESOLINE  TAKE 1 1/2 TABLETS THREE TIMES DAILY.     hydroxyurea 500 MG capsule  Commonly known as:  HYDREA  TAKE 2 CAPSULES ON MONDAYS AND TAKE 1 CAPSULE ALL OTHER DAYS OF THE WEEK.     levothyroxine 112 MCG  tablet  Commonly known as:  SYNTHROID, LEVOTHROID  TAKE 1 TABLET EACH DAY.     Magnesium 250 MG Tabs  Take 250 mg by mouth daily.     Melatonin 5 MG Caps  Take 5 mg by mouth at bedtime.     potassium chloride 10 MEQ tablet  Commonly known as:  K-DUR  Take 2 tablets (20 mEq total) by mouth daily.     PRESERVISION AREDS 2 Caps  Take 1 capsule by mouth 2 (two) times daily.     senna-docusate 8.6-50 MG tablet  Commonly known as:  Senokot-S  Take 2 tablets by mouth 2 (two) times daily.     traMADol 50 MG tablet  Commonly known as:  ULTRAM  Take one to two tablets by mouth every 6 hours as needed for pain     XARELTO 15 MG Tabs tablet  Generic drug:  Rivaroxaban  Take 15 mg by mouth daily with supper.        Review of Systems:  Review of Systems  Constitutional: Negative for fever, chills, activity change, appetite change, fatigue and unexpected weight change.  HENT: Negative for congestion and trouble swallowing.   Eyes: Negative for visual disturbance.  Respiratory: Negative for cough, chest tightness and shortness of breath.   Cardiovascular: Negative for chest pain, palpitations and leg swelling.  Gastrointestinal: Negative for abdominal pain, diarrhea, constipation, blood in stool and abdominal distention.  Genitourinary: Negative for difficulty urinating.  Musculoskeletal: Positive for back pain.  Neurological: Negative for weakness and headaches.  Hematological: Negative for adenopathy.  Psychiatric/Behavioral: The patient is not nervous/anxious.     Health Maintenance  Topic Date Due  . TETANUS/TDAP  08/03/1951  . ZOSTAVAX  08/02/1992  . INFLUENZA VACCINE  09/09/2015  . DEXA SCAN  Completed  . PNA vac Low Risk Adult  Completed   Physical Exam: Filed Vitals:   05/07/15 1422  BP: 112/62  Pulse: 64  Temp: 98.1 F (36.7 C)  TempSrc: Oral  Height: 5\' 2"  (1.575 m)  Weight: 116 lb (52.617 kg)  SpO2: 97%   Body mass index is 21.21 kg/(m^2). Physical Exam    Constitutional: She is oriented to person, place, and time. She appears well-developed and well-nourished. No distress.  Cardiovascular:  Intact distal pulses.   irreg irreg  Pulmonary/Chest: Effort normal and breath sounds normal. No respiratory distress. She has no wheezes. She has no rales.  Abdominal: Soft. Bowel sounds are normal. She exhibits no distension and no mass. There is no tenderness. There is no rebound and no guarding.  Musculoskeletal: Normal range of motion.  Ambulates with rolling walker  Neurological: She is alert and oriented to person, place, and time.  Skin: Skin is warm.  Psychiatric: She has a normal mood and affect.    Labs reviewed: Basic Metabolic Panel:  Recent Labs  05/15/14 1540  08/15/14 1651  09/02/14 0641 09/25/14 1151 10/29/14 11/29/14 1518  NA  --   < >  --   < > 137 136 137 138  K  --   < >  --   < > 3.4* 4.1 4.0 3.8  CL  --   < >  --   < > 101 98  --  99  CO2  --   < >  --   < > 28 31  --  26  GLUCOSE  --   < >  --   < > 90 70  --  64*  BUN  --   < >  --   < > 21* 16 22* 26*  CREATININE  --   < >  --   < > 0.86 0.83 0.9 0.87  CALCIUM  --   < >  --   < > 9.9 10.5  --  10.6*  TSH 1.10  --  1.14  --   --   --  0.72  --   < > = values in this interval not displayed. Liver Function Tests:  Recent Labs  09/01/14 1945 09/02/14 0641 11/29/14 1518  AST 22 20 22   ALT 20 17 17   ALKPHOS 59 52 61  BILITOT 0.5 0.9 0.7  PROT 6.2* 5.9* 6.8  ALBUMIN 3.9 3.8 4.3   No results for input(s): LIPASE, AMYLASE in the last 8760 hours. No results for input(s): AMMONIA in the last 8760 hours. CBC:  Recent Labs  09/01/14 1945 09/02/14 0641 10/29/14 11/07/14 1247  WBC 16.2* 12.4* 8.3 8.3  NEUTROABS 13.1* 9.9*  --  5.7  HGB 13.1 13.0 13.7 14.1  HCT 39.5 39.1 40 42.3  MCV 115.8* 115.7*  --  113.1*  PLT 469* 425* 384 382   Lipid Panel:  Recent Labs  10/29/14  CHOL 164  HDL 78*  LDLCALC 69  TRIG 133   No results found for:  HGBA1C  Procedures since last visit: US Abdomen Limited Ruq  04/29/2015  CLINICAL DATA:  Right upper quadrant pain for 5 days. EXAM: US ABDOMEN LIMITED - RIGHT UPPER QUADRANT COMPARISON:  MRI lumbar spine 04/15/2014 FINDINGS: Gallbladder: Gallbladder is moderately distended without wall thickening. There are small echogenic mobile structures in the gallbladder, largest measures up to 0.4 cm. Findings probably represent small stones. Common bile duct: Diameter: 0.2 cm. Liver: No focal lesion identified. Within normal limits in parenchymal echogenicity. Main portal vein is patent with normal direction of flow. There is fullness of the right renal pelvis and right renal calices. The renal collecting system fullness appears similar from the MRI of the lumbar spine on 04/15/2014. IMPRESSION: Gallbladder is moderately distended without wall thickening. Evidence for small gallstones. Fullness of the right renal collecting system is probably chronic based on prior lumbar spine MRI. Electronically Signed   By: Scherrie Gerlach.D.  On: 04/29/2015 17:18    Assessment/Plan 1. Estrogen deficiency - she is due for a bone density study--order placed and dx just changed today to read estrogen deficiency due to medicare not covering postmenopausal estrogen deficiency (!) - DG Bone Density; Future  2. Calculus of gallbladder without cholecystitis without obstruction -was having RUQ pain going around to her back, but this seems to be better now -has upcoming appt with Dr. Johney Maine for further evaluation  3. Structural abnormality of kidney -was also noted on her ultrasound of the abdomen -unclear what this is, plan to evaluate further after cholelithiasis is sorted out  4. Midline low back pain with right-sided sciatica -chronic, continues to require fentanyl patch 63mcg, but using a lot less tramadol now (none some day and at one point a couple of days in a row) -discussed that until she does not need tramadol at all,  we should leave fentanyl as is--she had some concerns about addiction  5. Paroxysmal atrial fibrillation (HCC) -she does not know when she is in afib -continues on xarelto as anticoagulation and amiodarone 1/2 tablet daily (unclear if this is 50mg  or if she is taking 1/2 of a 200mg  to make the 100mg ) -each time she's meant to have ablation, she goes out of afib, she reports  6. Hypothyroidism due to acquired atrophy of thyroid -last TSH wnl so same dose synthroid continued  Pt is to call [facility] [place] if she did in fact have zostavax and when  Labs/tests ordered:  3 mos med mgt Next appt:  08/06/2015  Anihya Tuma L. Nyasiah Moffet, D.O. [facility] [address], [place] [address] Cell Phone (Mon-Fri 8am-5pm):  [phone] On Call:  [phone] & follow prompts after 5pm & weekends Office Phone:  [phone] Office Fax:  [phone]

## 2015-05-07 NOTE — Patient Instructions (Signed)
[place] out when and where you had your shingles vaccine.  We don't have it on file.

## 2015-05-08 ENCOUNTER — Other Ambulatory Visit: Payer: Self-pay | Admitting: Cardiology

## 2015-05-09 ENCOUNTER — Ambulatory Visit (HOSPITAL_BASED_OUTPATIENT_CLINIC_OR_DEPARTMENT_OTHER): Payer: Medicare Other | Admitting: Hematology and Oncology

## 2015-05-09 ENCOUNTER — Other Ambulatory Visit (HOSPITAL_BASED_OUTPATIENT_CLINIC_OR_DEPARTMENT_OTHER): Payer: Medicare Other

## 2015-05-09 ENCOUNTER — Encounter: Payer: Self-pay | Admitting: Hematology and Oncology

## 2015-05-09 ENCOUNTER — Telehealth: Payer: Self-pay | Admitting: Hematology and Oncology

## 2015-05-09 VITALS — BP 134/56 | HR 60 | Temp 98.1°F | Resp 18 | Ht 62.0 in | Wt 114.4 lb

## 2015-05-09 DIAGNOSIS — D45 Polycythemia vera: Secondary | ICD-10-CM

## 2015-05-09 DIAGNOSIS — K8020 Calculus of gallbladder without cholecystitis without obstruction: Secondary | ICD-10-CM | POA: Diagnosis not present

## 2015-05-09 LAB — CBC WITH DIFFERENTIAL/PLATELET
BASO%: 1.1 % (ref 0.0–2.0)
BASOS ABS: 0.1 10*3/uL (ref 0.0–0.1)
EOS%: 1.4 % (ref 0.0–7.0)
Eosinophils Absolute: 0.1 10*3/uL (ref 0.0–0.5)
HEMATOCRIT: 41.9 % (ref 34.8–46.6)
HGB: 14.1 g/dL (ref 11.6–15.9)
LYMPH%: 18.1 % (ref 14.0–49.7)
MCH: 38.4 pg — AB (ref 25.1–34.0)
MCHC: 33.7 g/dL (ref 31.5–36.0)
MCV: 114.2 fL — ABNORMAL HIGH (ref 79.5–101.0)
MONO#: 1 10*3/uL — AB (ref 0.1–0.9)
MONO%: 10.8 % (ref 0.0–14.0)
NEUT#: 6.5 10*3/uL (ref 1.5–6.5)
NEUT%: 68.6 % (ref 38.4–76.8)
PLATELETS: 498 10*3/uL — AB (ref 145–400)
RBC: 3.67 10*6/uL — ABNORMAL LOW (ref 3.70–5.45)
RDW: 15.2 % — ABNORMAL HIGH (ref 11.2–14.5)
WBC: 9.5 10*3/uL (ref 3.9–10.3)
lymph#: 1.7 10*3/uL (ref 0.9–3.3)

## 2015-05-09 NOTE — Assessment & Plan Note (Signed)
She tolerated treatment well. She is tolerating the dose of hydroxyurea 500 mg daily as well. I will see her back in 6 months. Even though her platelet count is a little high, I felt that 500 mg daily is a reasonable dose for her. Continue to same for now. I also reminded her, if in the event that her treatment needs to be held for up to one week because gallbladder surgery, it would not be of harm to her

## 2015-05-09 NOTE — Progress Notes (Signed)
[facility] OFFICE PROGRESS NOTE  Patient Care Team: Gayland Curry, DO as PCP - General (Geriatric Medicine) [facility] Hurman Horn, MD as Consulting Physician (Ophthalmology) Larey Dresser, MD as Consulting Physician (Cardiology) Heath Lark, MD as Consulting Physician (Hematology and Oncology) Heath Lark, MD as Consulting Physician (Hematology and Oncology) Clent Jacks, MD as Consulting Physician (Ophthalmology) Jerrell Belfast, MD as Consulting Physician (Otolaryngology)  SUMMARY OF ONCOLOGIC HISTORY:   Polycythemia vera ([place])   08/05/2011 Pathology Results Peripheral blood JAK2 mutation was positive with low serum erythropoietin level. Bone marrow aspirate and biopsy was not performed.   08/12/2011 -  Chemotherapy She is started on hydroxyurea with periodic phlebotomy.    INTERVAL HISTORY: Please see below for problem oriented charting. She returns for further follow-up. She is doing well with one daily dose of hydroxyurea. Recently, she has right upper quadrant discomfort. Ultrasound of abdomen revealed possible gallbladder disease. General surgery appointment is pending. She denies recent infection. The patient denies any recent signs or symptoms of bleeding such as spontaneous epistaxis, hematuria or hematochezia.   REVIEW OF SYSTEMS:   Constitutional: Denies fevers, chills or abnormal weight loss Eyes: Denies blurriness of vision Ears, nose, mouth, throat, and face: Denies mucositis or sore throat Respiratory: Denies cough, dyspnea or wheezes Cardiovascular: Denies palpitation, chest discomfort or lower extremity swelling Gastrointestinal:  Denies nausea, heartburn or change in bowel habits Skin: Denies abnormal skin rashes Lymphatics: Denies new lymphadenopathy or easy bruising Neurological:Denies numbness, tingling or new weaknesses Behavioral/Psych: Mood is stable, no new changes  All other systems were reviewed with the patient  and are negative.  I have reviewed the past medical history, past surgical history, social history and family history with the patient and they are unchanged from previous note.  ALLERGIES:  is allergic to amlodipine.  MEDICATIONS:  Current Outpatient Prescriptions  Medication Sig Dispense Refill  . hydroxyurea (HYDREA) 500 MG capsule Take 500 mg by mouth daily. May take with food to minimize GI side effects.    [name] Kitchen acetaminophen (TYLENOL) 650 MG CR tablet Take 650 mg by mouth 3 (three) times daily.    [name] Kitchen amiodarone (PACERONE) 100 MG tablet Take 1 tablet (100 mg total) by mouth daily. (Patient taking differently: Take 100 mg by mouth. Take 1/2 tablet daily)    . Biotin 5000 MCG TABS Take 5,000 mcg by mouth daily.    . cholecalciferol (VITAMIN D) 1000 UNITS tablet Take 2,000 Units by mouth daily.     . fentaNYL (DURAGESIC - DOSED MCG/HR) 25 MCG/HR patch Place 1 patch (25 mcg total) onto the skin every 3 (three) days. 10 patch 0  . furosemide (LASIX) 40 MG tablet Take by mouth. 1.5 tablets (60mg ) two times a day    . gabapentin (NEURONTIN) 100 MG capsule TAKE 1 OR 2 CAPSULES AT BEDTIME. 60 capsule 5  . hydrALAZINE (APRESOLINE) 50 MG tablet TAKE 1 1/2 TABLETS THREE TIMES DAILY. 135 tablet 2  . levothyroxine (SYNTHROID, LEVOTHROID) 112 MCG tablet TAKE 1 TABLET EACH DAY. 30 tablet 3  . Magnesium 250 MG TABS Take 250 mg by mouth daily.     . Melatonin 5 MG CAPS Take 5 mg by mouth at bedtime.    . Multiple Vitamins-Minerals (PRESERVISION AREDS 2) CAPS Take 1 capsule by mouth 2 (two) times daily.    . potassium chloride (K-DUR) 10 MEQ tablet Take 2 tablets (20 mEq total) by mouth daily. 60 tablet 10  . Rivaroxaban (XARELTO) 15 MG  TABS tablet Take 15 mg by mouth daily with supper.    . senna-docusate (SENOKOT-S) 8.6-50 MG per tablet Take 2 tablets by mouth 2 (two) times daily. 120 tablet 3  . traMADol (ULTRAM) 50 MG tablet Take one to two tablets by mouth every 6 hours as needed for pain 60 tablet 0    No current facility-administered medications for this visit.    PHYSICAL EXAMINATION: ECOG PERFORMANCE STATUS: 1 - Symptomatic but completely ambulatory  Filed Vitals:   05/09/15 1501  BP: 134/56  Pulse: 60  Temp: 98.1 F (36.7 C)  Resp: 18   Filed Weights   05/09/15 1501  Weight: 114 lb 6.4 oz (51.891 kg)    GENERAL:alert, no distress and comfortable SKIN: skin color, texture, turgor are normal, no rashes or significant lesions EYES: normal, Conjunctiva are pink and non-injected, sclera clear OROPHARYNX:no exudate, no erythema and lips, buccal mucosa, and tongue normal  NECK: supple, thyroid normal size, non-tender, without nodularity LYMPH:  no palpable lymphadenopathy in the cervical, axillary or inguinal LUNGS: clear to auscultation and percussion with normal breathing effort HEART: regular rate & rhythm and no murmurs and no lower extremity edema ABDOMEN:abdomen soft, non-tender and normal bowel sounds Musculoskeletal:no cyanosis of digits and no clubbing  NEURO: alert & oriented x 3 with fluent speech, no focal motor/sensory deficits  LABORATORY DATA:  I have reviewed the data as listed    Component Value Date/Time   NA 138 11/29/2014 1518   NA 137 10/29/2014   K 3.8 11/29/2014 1518   CL 99 11/29/2014 1518   CO2 26 11/29/2014 1518   GLUCOSE 64* 11/29/2014 1518   BUN 26* 11/29/2014 1518   BUN 22* 10/29/2014   CREATININE 0.87 11/29/2014 1518   CREATININE 0.9 10/29/2014   CREATININE 0.83 09/25/2014 1151   CALCIUM 10.6* 11/29/2014 1518   PROT 6.8 11/29/2014 1518   ALBUMIN 4.3 11/29/2014 1518   AST 22 11/29/2014 1518   ALT 17 11/29/2014 1518   ALKPHOS 61 11/29/2014 1518   BILITOT 0.7 11/29/2014 1518   GFRNONAA >60 09/02/2014 0641   GFRAA >60 09/02/2014 0641    No results found for: SPEP, UPEP  Lab Results  Component Value Date   WBC 9.5 05/09/2015   NEUTROABS 6.5 05/09/2015   HGB 14.1 05/09/2015   HCT 41.9 05/09/2015   MCV 114.2* 05/09/2015   PLT  498* 05/09/2015      Chemistry      Component Value Date/Time   NA 138 11/29/2014 1518   NA 137 10/29/2014   K 3.8 11/29/2014 1518   CL 99 11/29/2014 1518   CO2 26 11/29/2014 1518   BUN 26* 11/29/2014 1518   BUN 22* 10/29/2014   CREATININE 0.87 11/29/2014 1518   CREATININE 0.9 10/29/2014   CREATININE 0.83 09/25/2014 1151   GLU 80 10/29/2014      Component Value Date/Time   CALCIUM 10.6* 11/29/2014 1518   ALKPHOS 61 11/29/2014 1518   AST 22 11/29/2014 1518   ALT 17 11/29/2014 1518   BILITOT 0.7 11/29/2014 1518     ASSESSMENT & PLAN:  Polycythemia vera  She tolerated treatment well. She is tolerating the dose of hydroxyurea 500 mg daily as well. I will see her back in 6 months. Even though her platelet count is a little high, I felt that 500 mg daily is a reasonable dose for her. Continue to same for now. I also reminded her, if in the event that her treatment needs to be held  for up to one week because gallbladder surgery, it would not be of harm to her  Asymptomatic cholelithiasis -Recent ultrasound for abdomen disclosed possible gallstone/gallbladder disease. General surgery appointment is pending. Currently, she is not symptomatic. There is no contraindication for her to proceed with surgery if needed and the treatment of hydroxyurea can be held up to a week during the postoperative period if needed.   Orders Placed This Encounter  Procedures  . CBC with Differential/Platelet    Standing Status: Future     Number of Occurrences:      Standing Expiration Date: 06/12/2016   All questions were answered. The patient knows to call the clinic with any problems, questions or concerns. No barriers to learning was detected. I spent 15 minutes counseling the patient face to face. The total time spent in the appointment was 20 minutes and more than 50% was on counseling and review of test results     [facility], [place], MD 05/09/2015 3:11 PM

## 2015-05-09 NOTE — Telephone Encounter (Signed)
Gave and printed appt sched and avs for pt for Sept °

## 2015-05-09 NOTE — Assessment & Plan Note (Signed)
-  Recent ultrasound for abdomen disclosed possible gallstone/gallbladder disease. General surgery appointment is pending. Currently, she is not symptomatic. There is no contraindication for her to proceed with surgery if needed and the treatment of hydroxyurea can be held up to a week during the postoperative period if needed.

## 2015-05-13 ENCOUNTER — Telehealth: Payer: Self-pay

## 2015-05-13 ENCOUNTER — Other Ambulatory Visit: Payer: Self-pay | Admitting: Surgery

## 2015-05-13 DIAGNOSIS — D1739 Benign lipomatous neoplasm of skin and subcutaneous tissue of other sites: Secondary | ICD-10-CM | POA: Diagnosis not present

## 2015-05-13 DIAGNOSIS — M5440 Lumbago with sciatica, unspecified side: Secondary | ICD-10-CM

## 2015-05-13 DIAGNOSIS — R1011 Right upper quadrant pain: Secondary | ICD-10-CM | POA: Diagnosis not present

## 2015-05-13 DIAGNOSIS — Z5320 Procedure and treatment not carried out because of patient's decision for unspecified reasons: Secondary | ICD-10-CM | POA: Diagnosis not present

## 2015-05-13 DIAGNOSIS — D1724 Benign lipomatous neoplasm of skin and subcutaneous tissue of left leg: Secondary | ICD-10-CM | POA: Diagnosis not present

## 2015-05-13 DIAGNOSIS — D170 Benign lipomatous neoplasm of skin and subcutaneous tissue of head, face and neck: Secondary | ICD-10-CM | POA: Diagnosis not present

## 2015-05-13 MED ORDER — FENTANYL 25 MCG/HR TD PT72: 25.0000 ug | MEDICATED_PATCH | TRANSDERMAL | Status: DC

## 2015-05-13 NOTE — Telephone Encounter (Signed)
Patient called to get refill on her Fentanyl 25 mcg patches, last refill 04/14/15. She will be in to see [facility] tomorrow in [place] clinic will pick it up then. Will need to be written.

## 2015-05-15 ENCOUNTER — Other Ambulatory Visit: Payer: Self-pay | Admitting: Internal Medicine

## 2015-05-16 ENCOUNTER — Other Ambulatory Visit: Payer: Self-pay | Admitting: *Deleted

## 2015-05-16 MED ORDER — TRAMADOL HCL 50 MG PO TABS: 50.0000 mg | ORAL_TABLET | Freq: Four times a day (QID) | ORAL | Status: DC | PRN

## 2015-05-16 NOTE — Telephone Encounter (Signed)
Spoke with patient and advised results rx called into pharmacy  

## 2015-05-16 NOTE — Addendum Note (Signed)
Addended by: Despina Hidden on: 05/16/2015 04:37 PM   Modules accepted: Orders

## 2015-05-26 DIAGNOSIS — H353134 Nonexudative age-related macular degeneration, bilateral, advanced atrophic with subfoveal involvement: Secondary | ICD-10-CM | POA: Diagnosis not present

## 2015-05-26 DIAGNOSIS — H35363 Drusen (degenerative) of macula, bilateral: Secondary | ICD-10-CM | POA: Diagnosis not present

## 2015-05-26 DIAGNOSIS — H353232 Exudative age-related macular degeneration, bilateral, with inactive choroidal neovascularization: Secondary | ICD-10-CM | POA: Diagnosis not present

## 2015-06-03 DIAGNOSIS — H35363 Drusen (degenerative) of macula, bilateral: Secondary | ICD-10-CM | POA: Diagnosis not present

## 2015-06-03 DIAGNOSIS — H3562 Retinal hemorrhage, left eye: Secondary | ICD-10-CM | POA: Diagnosis not present

## 2015-06-03 DIAGNOSIS — H353221 Exudative age-related macular degeneration, left eye, with active choroidal neovascularization: Secondary | ICD-10-CM | POA: Diagnosis not present

## 2015-06-03 DIAGNOSIS — H353134 Nonexudative age-related macular degeneration, bilateral, advanced atrophic with subfoveal involvement: Secondary | ICD-10-CM | POA: Diagnosis not present

## 2015-06-03 DIAGNOSIS — H353232 Exudative age-related macular degeneration, bilateral, with inactive choroidal neovascularization: Secondary | ICD-10-CM | POA: Diagnosis not present

## 2015-06-04 ENCOUNTER — Ambulatory Visit
Admission: RE | Admit: 2015-06-04 | Discharge: 2015-06-04 | Disposition: A | Discharge status: Home / Self Care | Payer: Medicare Other | Source: Ambulatory Visit | Attending: Internal Medicine | Admitting: Internal Medicine

## 2015-06-04 DIAGNOSIS — Z780 Asymptomatic menopausal state: Secondary | ICD-10-CM | POA: Diagnosis not present

## 2015-06-04 DIAGNOSIS — M810 Age-related osteoporosis without current pathological fracture: Secondary | ICD-10-CM | POA: Diagnosis not present

## 2015-06-04 DIAGNOSIS — E2839 Other primary ovarian failure: Secondary | ICD-10-CM

## 2015-06-05 NOTE — Progress Notes (Signed)
Pharmacist, community for Prolia submitted via [email or web address] today

## 2015-06-10 ENCOUNTER — Encounter: Payer: Self-pay | Admitting: Family Medicine

## 2015-06-10 ENCOUNTER — Ambulatory Visit (INDEPENDENT_AMBULATORY_CARE_PROVIDER_SITE_OTHER): Payer: Medicare Other | Admitting: Family Medicine

## 2015-06-10 ENCOUNTER — Other Ambulatory Visit: Payer: Self-pay

## 2015-06-10 VITALS — BP 118/74 | HR 76 | Ht 62.0 in | Wt 114.0 lb

## 2015-06-10 DIAGNOSIS — M545 Low back pain, unspecified: Secondary | ICD-10-CM

## 2015-06-10 DIAGNOSIS — M810 Age-related osteoporosis without current pathological fracture: Secondary | ICD-10-CM | POA: Diagnosis not present

## 2015-06-10 DIAGNOSIS — M5440 Lumbago with sciatica, unspecified side: Secondary | ICD-10-CM

## 2015-06-10 MED ORDER — GABAPENTIN 100 MG PO CAPS: 100.0000 mg | ORAL_CAPSULE | Freq: Three times a day (TID) | ORAL | Status: DC

## 2015-06-10 MED ORDER — VITAMIN D (ERGOCALCIFEROL) 1.25 MG (50000 UNIT) PO CAPS: 50000.0000 [IU] | ORAL_CAPSULE | ORAL | Status: DC

## 2015-06-10 MED ORDER — FENTANYL 25 MCG/HR TD PT72: 25.0000 ug | MEDICATED_PATCH | TRANSDERMAL | Status: DC

## 2015-06-10 NOTE — Assessment & Plan Note (Signed)
Severe at the moment.  On once weekly vitamin D supplementation. We'll need to continue to monitor closely.

## 2015-06-10 NOTE — Telephone Encounter (Signed)
1.) Patient would like for Dr.Reed to hand write her a script for Fentanyl Patch on Wednesday and give it to [facility] ([facility])  2.) Patient requested a status update on Prolia, patient aware we are awaiting response from insurance company.

## 2015-06-10 NOTE — Progress Notes (Signed)
[facility] [address] [place], [place] [address] Phone: [phone] Subjective:     CC: back pain follow up  [id number] Sarah Mays is a 82 y.o. female coming in with complaint of back pain. Patient is a past medical history for severe osteoporosis. Patient had significant amount of degenerative changes of the lumbar spine and did have 2 epidural steroid injections as well as facet injection over the course last month. Patient's last injection was 05/03/2014.   Patient unfortunately is now having worsening pain in the back. Hasn't had many other different pains and was now put on a fentanyl patch. Patient is concerned about doing this for long-term. She is wondering what all she can possibly do. Does feel that the gabapentin has been helpful but is concerned on going up on the dose at night. States no significant radiation of pain but continued, constant back pain noted. Can affect her daily activities. Is ambulating with the aid of a walker. Patient is awaiting prior authorization for medication to help her with her osteoporosis.  Past Medical History  Diagnosis Date  . PAF (paroxysmal atrial fibrillation) (HCC)     a. on amio (02/2012 mild obstruction on PFT's) and xarelto.  . Essential hypertension   . Mitral valve disorders   . Tricuspid valve disorders, specified as nonrheumatic   . Neck mass     right, w/u including MRI negative  . Hypothyroidism   . Macular degeneration     Left, s/p inj. tx  . Senile osteoporosis     Reclast in the past  . Alopecia, unspecified   . Pure hypercholesterolemia   . Chronic diastolic CHF (congestive heart failure) ([facility])     a. 11/2013 Echo: EF 60-65%, no rwma, mild TR, PASP 55mmHg.  [name] Kitchen Headache(784.0)   . Backache, unspecified   . Polycythemia vera(238.4)   . Gouty arthropathy, unspecified   . Insomnia, unspecified   . Other malaise and fatigue   . Hyposmolality and/or hyponatremia   . Hyperpotassemia    . Edema   . Closed fracture of lower end of radius with ulna   . Hypercalcemia   . Deafness     Left, s/p multiple surgeries  . Abnormal stress test     a. 11/2011 Ex MV: EF 80%, small, partially reversible anteroapical defect->mild ischemia vs attenuation-->med Rx.   Past Surgical History  Procedure Laterality Date  . Breast biopsy  06/18/1998    left  . Total abdominal hysterectomy    . Tonsillectomy    . Breast biopsy    . Tympanoplasty Bilateral   . Ablation  08/28/14    Back    Social History  Substance Use Topics  . Smoking status: Former Smoker    Types: Cigarettes    Quit date: 01/19/1984  . Smokeless tobacco: Never Used  . Alcohol Use: 0.6 oz/week    1 Glasses of wine per week     Comment: 2 per day/ 14 a week   Allergies  Allergen Reactions  . Amlodipine Swelling    Lower extremity edema    Family History  Problem Relation Age of Onset  . Hypertension Father   . Heart disease Father     patient does not know details.   . Breast cancer Daughter   . Cancer Daughter     breast  . Ovarian cancer Mother 41  . Cancer Sister     colon  . Heart disease Sister   .  Cancer Sister   . Cancer Sister     hodgkin disease      Past medical history, social, surgical and family history all reviewed in electronic medical record.   Patient's MRI on 04/15/2014 shows advanced osteophytic changes along by her spine as well as a bulging degenerative annular with flattening of the ventral thecal sac at L4-L5 on the left at L3-L4 affecting L3 and L4 nerve roots.  Review of Systems: No headache, visual changes, nausea, vomiting, diarrhea, constipation, dizziness, abdominal pain, skin rash, fevers, chills, night sweats, weight loss, swollen lymph nodes, body aches, joint swelling, muscle aches, chest pain, shortness of breath, mood changes.   Objective Blood pressure 118/74, pulse 76, height 5\' 2"  (1.575 m), weight 114 lb (51.71 kg), SpO2 94 %.  General: No apparent  distress alert and oriented x3 mood and affect normal, dressed appropriately.  HEENT: Pupils equal, extraocular movements intact  Respiratory: Patient's speak in full sentences and does not appear short of breath  Cardiovascular: No lower extremity edema, non tender, no erythema  Skin: Warm dry intact with no signs of infection or rash on extremities or on axial skeleton.  Abdomen: Soft nontender  Neuro: Cranial nerves II through XII are intact, neurovascularly intact in all extremities with 2+ DTRs and 2+ pulses.  Lymph: No lymphadenopathy of posterior or anterior cervical chain or axillae bilaterally.  GaitHe relates with a walker MSK:  Non tender with full range of motion and good stability and symmetric strength and tone of shoulders, elbows, wrist, hip, knee and ankles bilaterally.  Back Exam:  Inspection: Unremarkable mild increasing kyphosis Motion: Flexion 30 deg, Extension 15 deg, Side Bending to 30 deg bilaterally,  Rotation to 35 deg bilaterally  SLR laying: Negative  XSLR laying: Negative  Palpable tenderness: Increased tenderness in the thoracolumbar and lumbar paraspinal musculature previous exam. No spinous process tenderness. FABER: Continue positive right Sensory change: Gross sensation intact to all lumbar and sacral dermatomes.  Reflexes: 2+ at both patellar tendons, 2+ at achilles tendons, Babinski's downgoing.  Strength at foot  4-5 strength but symmetric Leg strength  4 out of 5 strength but symmetric     Impression and Recommendations:     This case required medical decision making of moderate complexity.

## 2015-06-10 NOTE — Assessment & Plan Note (Signed)
Patient previously was responded by a injection previously. We discussed we can do this again if necessary. Patient declined that today. Patient has had some improvement with the gabapentin and we'll change it to 3 times a day dosing. Watch for any type of sedation. Patient is taking tramadol for any breakthrough pain. Patient was put on vitamin D supplementation secondary to her history of osteoporosis. Encourage her to check on the this fossa and 8 approval. Otherwise I would start at least an oral agent until approval. Patient is doing well with the gabapentin then reconstructed titrate off the fentanyl patch. Patient and will follow-up with me again in 4 weeks for further evaluation and treatment.

## 2015-06-10 NOTE — Progress Notes (Signed)
Pre visit review using our clinic review tool, if applicable. No additional management support is needed unless otherwise documented below in the visit note. 

## 2015-06-10 NOTE — Patient Instructions (Signed)
Good to see you  Gabapentin 100mg  3 times a day  Once weekly vitamin D Continue the fentynl patch and try to decrease maybe in the future Continue ice when you need it.  If needed we can do another epidural and just call.  Otherwise see me again in 4-6 weeks.

## 2015-06-16 ENCOUNTER — Telehealth: Payer: Self-pay

## 2015-06-16 ENCOUNTER — Encounter (HOSPITAL_COMMUNITY): Payer: Self-pay

## 2015-06-16 ENCOUNTER — Emergency Department (HOSPITAL_COMMUNITY)
Admission: EM | Admit: 2015-06-16 | Discharge: 2015-06-16 | Disposition: A | Discharge status: Home / Self Care | Payer: Medicare Other | Attending: Emergency Medicine | Admitting: Emergency Medicine

## 2015-06-16 ENCOUNTER — Other Ambulatory Visit: Payer: Self-pay | Admitting: Internal Medicine

## 2015-06-16 ENCOUNTER — Emergency Department (HOSPITAL_COMMUNITY): Payer: Medicare Other

## 2015-06-16 DIAGNOSIS — M810 Age-related osteoporosis without current pathological fracture: Secondary | ICD-10-CM | POA: Diagnosis not present

## 2015-06-16 DIAGNOSIS — Z7901 Long term (current) use of anticoagulants: Secondary | ICD-10-CM | POA: Diagnosis not present

## 2015-06-16 DIAGNOSIS — Z79899 Other long term (current) drug therapy: Secondary | ICD-10-CM | POA: Insufficient documentation

## 2015-06-16 DIAGNOSIS — Z862 Personal history of diseases of the blood and blood-forming organs and certain disorders involving the immune mechanism: Secondary | ICD-10-CM | POA: Diagnosis not present

## 2015-06-16 DIAGNOSIS — H9190 Unspecified hearing loss, unspecified ear: Secondary | ICD-10-CM | POA: Insufficient documentation

## 2015-06-16 DIAGNOSIS — I5032 Chronic diastolic (congestive) heart failure: Secondary | ICD-10-CM | POA: Diagnosis not present

## 2015-06-16 DIAGNOSIS — Z8781 Personal history of (healed) traumatic fracture: Secondary | ICD-10-CM | POA: Insufficient documentation

## 2015-06-16 DIAGNOSIS — R51 Headache: Secondary | ICD-10-CM | POA: Diagnosis not present

## 2015-06-16 DIAGNOSIS — E039 Hypothyroidism, unspecified: Secondary | ICD-10-CM | POA: Diagnosis not present

## 2015-06-16 DIAGNOSIS — I10 Essential (primary) hypertension: Secondary | ICD-10-CM | POA: Insufficient documentation

## 2015-06-16 DIAGNOSIS — I480 Paroxysmal atrial fibrillation: Secondary | ICD-10-CM | POA: Diagnosis not present

## 2015-06-16 DIAGNOSIS — Z87891 Personal history of nicotine dependence: Secondary | ICD-10-CM | POA: Insufficient documentation

## 2015-06-16 DIAGNOSIS — R519 Headache, unspecified: Secondary | ICD-10-CM

## 2015-06-16 DIAGNOSIS — G4700 Insomnia, unspecified: Secondary | ICD-10-CM | POA: Diagnosis not present

## 2015-06-16 DIAGNOSIS — Z872 Personal history of diseases of the skin and subcutaneous tissue: Secondary | ICD-10-CM | POA: Diagnosis not present

## 2015-06-16 DIAGNOSIS — R52 Pain, unspecified: Secondary | ICD-10-CM | POA: Diagnosis not present

## 2015-06-16 MED ORDER — OXYCODONE-ACETAMINOPHEN 5-325 MG PO TABS
1.0000 | ORAL_TABLET | Freq: Once | ORAL | Status: AC
  Administered 2015-06-16: 1 via ORAL
  Filled 2015-06-16: qty 1

## 2015-06-16 MED ORDER — ONDANSETRON 4 MG PO TBDP
8.0000 mg | ORAL_TABLET | Freq: Once | ORAL | Status: DC
Start: 2015-06-16 — End: 2015-06-16
  Filled 2015-06-16: qty 2

## 2015-06-16 MED ORDER — OXYCODONE-ACETAMINOPHEN 5-325 MG PO TABS: 1.0000 | ORAL_TABLET | ORAL | Status: DC | PRN

## 2015-06-16 MED ORDER — ACETAMINOPHEN 325 MG PO TABS
325.0000 mg | ORAL_TABLET | Freq: Once | ORAL | Status: AC
Start: 2015-06-16 — End: 2015-06-16
  Administered 2015-06-16: 325 mg via ORAL
  Filled 2015-06-16: qty 1

## 2015-06-16 MED ORDER — MORPHINE SULFATE (PF) 4 MG/ML IV SOLN
4.0000 mg | Freq: Once | INTRAVENOUS | Status: DC
  Filled 2015-06-16: qty 1

## 2015-06-16 NOTE — Discharge Instructions (Signed)

## 2015-06-16 NOTE — ED Provider Notes (Signed)
CSN: [id number]     Arrival date & time 06/16/15  1809 History   First MD Initiated Contact with Patient 06/16/15 1809     Chief Complaint  Patient presents with  . Headache    HPI Comments: 82 year old female presents with a headache. She states she woke up with a headache at 4 AM last night. She took tramadol without relief. Denies personal history of headaches and this is the worst headache she's had. She states it is in the right side of her forehead and wraps around to the back. She also took Tylenol this afternoon which provided minimal relief. Denies fever, loss of consciousness, neck pain/stiffness, vision changes, nausea, vomiting, head trauma.  Patient is a 82 y.o. female presenting with headaches.  Headache Associated symptoms: no dizziness, no fever, no numbness, no photophobia and no weakness     Past Medical History  Diagnosis Date  . PAF (paroxysmal atrial fibrillation) (HCC)     a. on amio (02/2012 mild obstruction on PFT's) and xarelto.  . Essential hypertension   . Mitral valve disorders   . Tricuspid valve disorders, specified as nonrheumatic   . Neck mass     right, w/u including MRI negative  . Hypothyroidism   . Macular degeneration     Left, s/p inj. tx  . Senile osteoporosis     Reclast in the past  . Alopecia, unspecified   . Pure hypercholesterolemia   . Chronic diastolic CHF (congestive heart failure) ([place])     a. 11/2013 Echo: EF 60-65%, no rwma, mild TR, PASP 60mmHg.  [name] Kitchen Headache(784.0)   . Backache, unspecified   . Polycythemia vera(238.4)   . Gouty arthropathy, unspecified   . Insomnia, unspecified   . Other malaise and fatigue   . Hyposmolality and/or hyponatremia   . Hyperpotassemia   . Edema   . Closed fracture of lower end of radius with ulna   . Hypercalcemia   . Deafness     Left, s/p multiple surgeries  . Abnormal stress test     a. 11/2011 Ex MV: EF 80%, small, partially reversible anteroapical defect->mild ischemia vs  attenuation-->med Rx.   Past Surgical History  Procedure Laterality Date  . Breast biopsy  06/18/1998    left  . Total abdominal hysterectomy    . Tonsillectomy    . Breast biopsy    . Tympanoplasty Bilateral   . Ablation  08/28/14    Back    Family History  Problem Relation Age of Onset  . Hypertension Father   . Heart disease Father     patient does not know details.   . Breast cancer Daughter   . Cancer Daughter     breast  . Ovarian cancer Mother 88  . Cancer Sister     colon  . Heart disease Sister   . Cancer Sister   . Cancer Sister     hodgkin disease   Social History  Substance Use Topics  . Smoking status: Former Smoker    Types: Cigarettes    Quit date: 01/19/1984  . Smokeless tobacco: Never Used  . Alcohol Use: 0.6 oz/week    1 Glasses of wine per week     Comment: 2 per day/ 14 a week   OB History    No data available     Review of Systems  Constitutional: Negative for fever.  Eyes: Negative for photophobia and visual disturbance.  Respiratory: Negative for shortness of breath.   Cardiovascular: Negative for  chest pain.  Neurological: Positive for headaches. Negative for dizziness, tremors, syncope, facial asymmetry, speech difficulty, weakness, light-headedness and numbness.    Allergies  Amlodipine  Home Medications   Prior to Admission medications   Medication Sig Start Date End Date Taking? Authorizing Provider  acetaminophen (TYLENOL) 650 MG CR tablet Take 650 mg by mouth 3 (three) times daily.    Historical Provider, MD  amiodarone (PACERONE) 100 MG tablet Take 1 tablet (100 mg total) by mouth daily. Patient taking differently: Take 100 mg by mouth. Take 1/2 tablet daily 11/29/14   Larey Dresser, MD  Biotin 5000 MCG TABS Take 5,000 mcg by mouth daily.    Historical Provider, MD  cholecalciferol (VITAMIN D) 1000 UNITS tablet Take 2,000 Units by mouth daily.     Historical Provider, MD  fentaNYL (DURAGESIC - DOSED MCG/HR) 25 MCG/HR patch  Place 1 patch (25 mcg total) onto the skin every 3 (three) days. 06/10/15   Tiffany L Reed, DO  furosemide (LASIX) 40 MG tablet Take by mouth. 1.5 tablets (60mg ) two times a day 09/23/14   Larey Dresser, MD  gabapentin (NEURONTIN) 100 MG capsule Take 1 capsule (100 mg total) by mouth 3 (three) times daily. 06/10/15   Lyndal Pulley, DO  hydrALAZINE (APRESOLINE) 50 MG tablet TAKE 1 1/2 TABLETS THREE TIMES DAILY. 05/08/15   Larey Dresser, MD  hydroxyurea (HYDREA) 500 MG capsule TAKE 2 CAPSULES ON MONDAYS AND TAKE 1 CAPSULE ALL OTHER DAYS OF THE WEEK. 05/15/15   Tiffany L Reed, DO  levothyroxine (SYNTHROID, LEVOTHROID) 112 MCG tablet TAKE 1 TABLET EACH DAY. 03/06/15   Tiffany L Reed, DO  Magnesium 250 MG TABS Take 250 mg by mouth daily.     Historical Provider, MD  Melatonin 5 MG CAPS Take 5 mg by mouth at bedtime.    Historical Provider, MD  Multiple Vitamins-Minerals (PRESERVISION AREDS 2) CAPS Take 1 capsule by mouth 2 (two) times daily.    Historical Provider, MD  potassium chloride (K-DUR) 10 MEQ tablet Take 2 tablets (20 mEq total) by mouth daily. 12/17/14   Larey Dresser, MD  Rivaroxaban (XARELTO) 15 MG TABS tablet Take 15 mg by mouth daily with supper.    Historical Provider, MD  senna-docusate (SENOKOT-S) 8.6-50 MG per tablet Take 2 tablets by mouth 2 (two) times daily. 08/07/14   Tiffany L Reed, DO  traMADol (ULTRAM) 50 MG tablet TAKE 1 OR 2 TABLETS EVERY 6 HOURS AS NEEDED FOR PAIN 06/16/15   Gayland Curry, DO  Vitamin D, Ergocalciferol, (DRISDOL) 50000 units CAPS capsule Take 1 capsule (50,000 Units total) by mouth every 7 (seven) days. 06/10/15   Lyndal Pulley, DO   Pulse 80  Temp(Src) 98.5 F (36.9 C) (Oral)  Resp 16  SpO2 96%   Physical Exam  Constitutional: She is oriented to person, place, and time. She appears well-developed and well-nourished. No distress.  HENT:  Head: Normocephalic and atraumatic.  Eyes: Conjunctivae are normal. Pupils are equal, round, and reactive to light.  Right eye exhibits no discharge. Left eye exhibits no discharge. No scleral icterus.  Neck: Normal range of motion. Neck supple.  Cardiovascular: Normal rate and regular rhythm.  Exam reveals no gallop and no friction rub.   No murmur heard. Pulmonary/Chest: Effort normal and breath sounds normal. No respiratory distress. She has no wheezes. She has no rales. She exhibits no tenderness.  Neurological: She is alert and oriented to person, place, and time.  Mental Status:  Alert, oriented, thought content appropriate, able to give a coherent history. Speech fluent without evidence of aphasia. Able to follow 2 step commands without difficulty.  Cranial Nerves:  II:  Peripheral visual fields grossly normal, pupils equal, round, reactive to light III,IV, VI: ptosis not present, extra-ocular motions intact bilaterally  V,VII: smile symmetric, facial light touch sensation equal VIII: hearing grossly normal to voice  X: uvula elevates symmetrically  XI: bilateral shoulder shrug symmetric and strong XII: midline tongue extension without fassiculations Motor:  Normal tone. 5/5 in upper and lower extremities bilaterally including strong and equal grip strength and dorsiflexion/plantar flexion Sensory: Pinprick and light touch normal in all extremities.  Deep Tendon Reflexes: 2+ and symmetric in the biceps and patella Cerebellar: normal finger-to-nose with bilateral upper extremities Gait: Walks with walker - steady gait CV: distal pulses palpable throughout     Skin: Skin is warm and dry.  Psychiatric: She has a normal mood and affect.    ED Course  Procedures (including critical care time) Labs Review Labs Reviewed - No data to display  Imaging Review No results found. I have personally reviewed and evaluated these images and lab results as part of my medical decision-making.   EKG Interpretation None      MDM   Final diagnoses:  None   82 year old female with new onset HA. She  states it was acute and she doesn't have HA normally. No focal neuro deficits. Will obtain CT of head.   Patient signed out to Dr. Gilford Raid. Patient is stable and in NAD.   Recardo Evangelist, PA-C 06/16/15 2053

## 2015-06-16 NOTE — ED Notes (Signed)
Claiborne Billings, PA at bedside speaking with patient.

## 2015-06-16 NOTE — Telephone Encounter (Signed)
Spoke with patient. Will try the altered dose for the gabapentin

## 2015-06-16 NOTE — ED Notes (Signed)
PER EMS: pt here for headache since 4am. She has taken tramadol and tylenol today without relief. Since her headache has not subsided she called PCP and was told to come to ER. Pt A&Ox4. EMS states pt has no neuro deficits. No weakness or numbness. Upon arrival, pt ambulated to restroom with one person assist. BP-162/85, HR-81 (a-fib), O2-95% RA.

## 2015-06-16 NOTE — Telephone Encounter (Signed)
Maybe it is the gabapentin .  Could go down to 100mg  in AM and 200mg  at night instead of the 3 times dosing Could also be the patch so hard to know for sure.

## 2015-06-16 NOTE — Telephone Encounter (Signed)
Patient is having a bad headache. She is thinking is from upping her medication. She wants to know if that could be causing it. PLease follow up or advise.

## 2015-06-17 ENCOUNTER — Encounter: Payer: Self-pay | Admitting: Internal Medicine

## 2015-06-17 ENCOUNTER — Non-Acute Institutional Stay (SKILLED_NURSING_FACILITY): Payer: Medicare Other | Admitting: Internal Medicine

## 2015-06-17 DIAGNOSIS — I160 Hypertensive urgency: Secondary | ICD-10-CM

## 2015-06-17 DIAGNOSIS — R51 Headache: Secondary | ICD-10-CM

## 2015-06-17 DIAGNOSIS — R519 Headache, unspecified: Secondary | ICD-10-CM

## 2015-06-17 NOTE — Progress Notes (Signed)
Patient ID: Sarah Mays, female   DOB: 10/10/1930, 82 y.o.   MRN: [id number]   Location:  [facility] Room Number: 094 Place of Service:  SNF (31) Provider:Siearra Amberg L. Mariea Clonts, D.O., C.M.D.  PCP: Hollace Kinnier, DO Patient Care Team: Gayland Curry, DO as PCP - General (Geriatric Medicine) [facility] Hurman Horn, MD as Consulting Physician (Ophthalmology) Larey Dresser, MD as Consulting Physician (Cardiology) Heath Lark, MD as Consulting Physician (Hematology and Oncology) Clent Jacks, MD as Consulting Physician (Ophthalmology) Jerrell Belfast, MD as Consulting Physician (Otolaryngology)  Extended Emergency Contact Information Primary Emergency Contact: Barnaby,[name] Address: [address] [address] [place] Phone: [phone] Relation: Spouse Secondary Emergency Contact: [name] [place] Phone: [phone] Mobile Phone: [phone] Relation: Daughter  Code Status: DNR Goals of care:  Advanced Directive information Advanced Directives 06/17/2015  Does patient have an advance directive? Yes  Type of Paramedic of [place];Out of facility DNR (pink MOST or yellow form);Living will  Copy of advanced directive(s) in chart? Yes  Would patient like information on creating an advanced directive? -  Pre-existing out of facility DNR order (yellow form or pink MOST form) Yellow form placed in chart (order not valid for inpatient use)    Allergies  Allergen Reactions  . Amlodipine Swelling    Lower extremity edema     Chief Complaint  Patient presents with  . Acute Visit    respite for observation due to hypertensive urgency and headache  . Discharge Note    HPI:  82 y.o. female seen acutely for hypertensive urgency and headache.  She has a h/o chronic low back pain and takes spinal injections, gabapentin, fentanyl patch for  pain.  She reports no recent med changes or injections.  Yesterday, she visited the clinic nurse as she didn't feel well and her bp was elevated.  She takes hydralazine 79m po tid through cardiology.  She had taken this as usual.  She had a mild headache, but throughout the day this worsened and she called to be reevaluated.  Headache was terrible and she does not usually get headaches, bp was in the 180s over 110s.  She had no focal neuro deficits/stroke signs.  bp did not respond to an extra dose of hydralazine so she was sent out to the hospital.  There she underwent an aggressive workup including ekg, CT brain.  CT did not reveal any signs of stroke, but some small foci of lucency in the calvarium which were nonspecific and could be due to osteopenia (which she does have) and myeloma is mentioned, but seems unlikely given the numerous imaging studies of her spine she's had in the recent past w/o similar findings, normal renal function in oct '16.  Ca slightly elevated.  When she returned from the ED, she was admitted to [facility] respite last night for monitoring.  Initially bp still high and she got another hydralazine.  It has improved to the 140s this am and her headache is barely there.  She sees me again in about a week.  She is eager [place].    Past Medical History  Diagnosis Date  . PAF (paroxysmal atrial fibrillation) (HCC)     a. on amio (02/2012 mild obstruction on PFT's) and xarelto.  . Essential hypertension   . Mitral valve disorders   .  Tricuspid valve disorders, specified as nonrheumatic   . Neck mass     right, w/u including MRI negative  . Hypothyroidism   . Macular degeneration     Left, s/p inj. tx  . Senile osteoporosis     Reclast in the past  . Alopecia, unspecified   . Pure hypercholesterolemia   . Chronic diastolic CHF (congestive heart failure) ([facility])     a. 11/2013 Echo: EF 60-65%, no rwma, mild TR, PASP 80mHg.  .[name] KitchenHeadache(784.0)   . Backache,  unspecified   . Polycythemia vera(238.4)   . Gouty arthropathy, unspecified   . Insomnia, unspecified   . Other malaise and fatigue   . Hyposmolality and/or hyponatremia   . Hyperpotassemia   . Edema   . Closed fracture of lower end of radius with ulna   . Hypercalcemia   . Deafness     Left, s/p multiple surgeries  . Abnormal stress test     a. 11/2011 Ex MV: EF 80%, small, partially reversible anteroapical defect->mild ischemia vs attenuation-->med Rx.    Past Surgical History  Procedure Laterality Date  . Breast biopsy  06/18/1998    left  . Total abdominal hysterectomy    . Tonsillectomy    . Breast biopsy    . Tympanoplasty Bilateral   . Ablation  08/28/14    Back       reports that she quit smoking about 31 years ago. Her smoking use included Cigarettes. She has never used smokeless tobacco. She reports that she drinks about 0.6 oz of alcohol per week. She reports that she does not use illicit drugs. Social History   Social History  . Marital Status: Married    Spouse Name: JJenny Reichmann . Number of Children: 2  . Years of Education: 16   Occupational History  . Not on file.   Social History Main Topics  . Smoking status: Former Smoker    Types: Cigarettes    Quit date: 01/19/1984  . Smokeless tobacco: Never Used  . Alcohol Use: 0.6 oz/week    1 Glasses of wine per week     Comment: 2 per day/ 14 a week  . Drug Use: No  . Sexual Activity: Not Currently   Other Topics Concern  . Not on file   Social History Narrative   Patient is Married 1955. 2 kids. 174grandkid-267years old in 2016. College graduate ([name].    Lives in apartment,  [facility]      Stay at home mother.       No Smoking history, Mod. alcohol use.   Patient has a living will, POA      Hobbies: CPU and painting            Functional Status Survey: Is the patient deaf or have difficulty hearing?: No Does the  patient have difficulty seeing, even when wearing glasses/contacts?: No Does the patient have difficulty concentrating, remembering, or making decisions?: No Does the patient have difficulty walking or climbing stairs?: No Does the patient have difficulty dressing or bathing?: No Does the patient have difficulty doing errands alone such as visiting a doctor's office or shopping?: No  Allergies  Allergen Reactions  . Amlodipine Swelling    Lower extremity edema     Pertinent  Health Maintenance Due  Topic Date Due  . INFLUENZA VACCINE  09/09/2015  . DEXA SCAN  Completed  . PNA vac Low Risk  Adult  Completed    Medications:   Medication List       This list is accurate as of: 06/17/15 11:59 PM.  Always use your most recent med list.               acetaminophen 650 MG CR tablet  Commonly known as:  TYLENOL  Take 650 mg by mouth 3 (three) times daily.     amiodarone 100 MG tablet  Commonly known as:  PACERONE  Take 100 mg by mouth daily.     cholecalciferol 1000 units tablet  Commonly known as:  VITAMIN D  Take 2,000 Units by mouth daily.     fentaNYL 25 MCG/HR patch  Commonly known as:  DURAGESIC - dosed mcg/hr  Place 1 patch (25 mcg total) onto the skin every 3 (three) days.     furosemide 40 MG tablet  Commonly known as:  LASIX  Take by mouth. 1.5 tablets (29m) two times a day     gabapentin 100 MG capsule  Commonly known as:  NEURONTIN  Take 1 capsule (100 mg total) by mouth 3 (three) times daily.     hydrALAZINE 50 MG tablet  Commonly known as:  APRESOLINE  TAKE 1 1/2 TABLETS THREE TIMES DAILY.     hydroxyurea 500 MG capsule  Commonly known as:  HYDREA  TAKE 2 CAPSULES ON MONDAYS AND TAKE 1 CAPSULE ALL OTHER DAYS OF THE WEEK.     levothyroxine 112 MCG tablet  Commonly known as:  SYNTHROID, LEVOTHROID  TAKE 1 TABLET EACH DAY.     Magnesium 250 MG Tabs  Take 250 mg by mouth daily.     Melatonin 5 MG Caps  Take 5 mg by mouth at bedtime.      oxyCODONE-acetaminophen 5-325 MG tablet  Commonly known as:  PERCOCET/ROXICET  Take 1 tablet by mouth every 4 (four) hours as needed for severe pain.     potassium chloride 10 MEQ tablet  Commonly known as:  K-DUR  Take 2 tablets (20 mEq total) by mouth daily.     PRESERVISION AREDS 2 Caps  Take 1 capsule by mouth 2 (two) times daily.     senna-docusate 8.6-50 MG tablet  Commonly known as:  Senokot-S  Take 2 tablets by mouth 2 (two) times daily.     traMADol 50 MG tablet  Commonly known as:  ULTRAM  TAKE 1 OR 2 TABLETS EVERY 6 HOURS AS NEEDED FOR PAIN     XARELTO 15 MG Tabs tablet  Generic drug:  Rivaroxaban  Take 15 mg by mouth daily with supper.        Review of Systems  Constitutional: Negative for fever, chills, activity change, appetite change, fatigue and unexpected weight change.  HENT: Negative for congestion, hearing loss and sinus pressure.   Respiratory: Negative for chest tightness and shortness of breath.   Cardiovascular: Negative for chest pain.  Gastrointestinal: Negative for abdominal pain.  Genitourinary: Negative for dysuria.  Musculoskeletal: Positive for back pain. Negative for gait problem.       Chronic right neck mass previously worked up  Skin: Negative for color change.  Neurological: Positive for headaches. Negative for dizziness, tremors, syncope, facial asymmetry, speech difficulty, weakness, light-headedness and numbness.  Psychiatric/Behavioral: Negative for confusion, decreased concentration and agitation. The patient is not nervous/anxious.     Filed Vitals:   06/17/15 1021  BP: 148/76  Pulse: 69  Temp: 97.7 F (36.5 C)  TempSrc: Oral  Resp: 18  SpO2: 95%   There is no weight on file to calculate BMI. Physical Exam  Constitutional: She is oriented to person, place, and time. She appears well-developed and well-nourished. No distress.  HENT:  Head: Normocephalic and atraumatic.  Eyes: Conjunctivae and EOM are normal. Pupils are  equal, round, and reactive to light.  Cardiovascular: Normal rate, regular rhythm, normal heart sounds and intact distal pulses.   Pulmonary/Chest: Effort normal and breath sounds normal.  Abdominal: Soft. Bowel sounds are normal. She exhibits no distension. There is no tenderness.  Musculoskeletal: Normal range of motion.  Neurological: She is alert and oriented to person, place, and time. She has normal reflexes. No cranial nerve deficit. Coordination normal.  Skin: Skin is warm and dry.  Psychiatric: She has a normal mood and affect.    Labs reviewed: Basic Metabolic Panel:  Recent Labs  09/02/14 0641 09/25/14 1151 10/29/14 11/29/14 1518  NA 137 136 137 138  K 3.4* 4.1 4.0 3.8  CL 101 98  --  99  CO2 28 31  --  26  GLUCOSE 90 70  --  64*  BUN 21* 16 22* 26*  CREATININE 0.86 0.83 0.9 0.87  CALCIUM 9.9 10.5  --  10.6*   Liver Function Tests:  Recent Labs  09/01/14 1945 09/02/14 0641 11/29/14 1518  AST 22 20 22   ALT 20 17 17   ALKPHOS 59 52 61  BILITOT 0.5 0.9 0.7  PROT 6.2* 5.9* 6.8  ALBUMIN 3.9 3.8 4.3   No results for input(s): LIPASE, AMYLASE in the last 8760 hours. No results for input(s): AMMONIA in the last 8760 hours. CBC:  Recent Labs  09/02/14 0641 10/29/14 11/07/14 1247 05/09/15 1445  WBC 12.4* 8.3 8.3 9.5  NEUTROABS 9.9*  --  5.7 6.5  HGB 13.0 13.7 14.1 14.1  HCT 39.1 40 42.3 41.9  MCV 115.7*  --  113.1* 114.2*  PLT 425* 384 382 498*   Cardiac Enzymes:  Recent Labs  09/02/14 0037 09/02/14 0907 09/02/14 1844  TROPONINI <0.03 0.05* 0.03   BNP: Invalid input(s): POCBNP CBG: No results for input(s): GLUCAP in the last 8760 hours.  Procedures and Imaging Studies During Stay: Ct Head Wo Contrast  06/16/2015  CLINICAL DATA:  Headache all day today. EXAM: CT HEAD WITHOUT CONTRAST TECHNIQUE: Contiguous axial images were obtained from the base of the skull through the vertex without contrast. COMPARISON:  None FINDINGS: Mild cerebral atrophy.  No evidence for acute hemorrhage, mass lesion, midline shift, hydrocephalus or large infarct. Fluid in the right mastoid air cells. Deformity along the medial left orbital wall probably represents an old injury. No acute calvarial fracture. Extensive degenerative changes around the odontoid process. There are innumerable small lucent foci throughout the calvarium. IMPRESSION: No acute intracranial abnormality. Scattered small foci of lucency throughout the calvarium are nonspecific. Findings could be secondary to osteopenia. A neoplastic process such as multiple myeloma is thought to be less likely but cannot be completely excluded. Fluid in the right mastoid air cells. Electronically Signed   By: Markus Daft M.D.   On: 06/16/2015 21:04   Dg Bone Density  06/04/2015  EXAM: DUAL X-RAY ABSORPTIOMETRY (DXA) FOR BONE MINERAL DENSITY IMPRESSION: Referring Physician:  Hollace Kinnier PATIENT: Name: ARWILDA, [place] Patient ID: [id number] Birth Date: 1930-11-29 Height: 59.7 in. Sex: Female Measured: 06/04/2015 Weight: 114.0 lbs. Indications: Advanced Age, Bilateral Ovariectomy (65.51), Caucasian, Estrogen Deficient, Gabapentin, Height Loss (781.91), History of Fracture (Adult) (V15.51), Hypothyroid, Hysterectomy, Low Body Weight (783.22), Postmenopausal, Synthroid Fractures:  Wrist Treatments: Vitamin D (E933.5) ASSESSMENT: The BMD measured at Forearm Radius 33% is 0.487 g/cm2 with a T-score of -4.5. This patient is considered osteoporotic according to [facility]) criteria. Site Region Measured Date Measured Age YA BMD Significant CHANGE T-score Right Forearm Radius 33% 06/04/2015 82.8 -4.7 0.487 g/cm2 DualFemur Total Left 06/04/2015 82.8 -4.0 0.498 g/cm2 World Health Organization [facility]) criteria for post-menopausal, Caucasian Women: Normal       T-score at or above -1 SD Osteopenia   T-score between -1 and -2.5 SD Osteoporosis T-score at or below -2.5 SD RECOMMENDATION: [facility]  recommends that FDA-approved medical therapies be considered in postmenopausal women and men age 11 or older with a: 1. Hip or vertebral (clinical or morphometric) fracture. 2. T-score of <-2.5 at the spine or hip. 3. Ten-year fracture probability by FRAX of 3% or greater for hip fracture or 20% or greater for major osteoporotic fracture. All treatment decisions require clinical judgment and consideration of individual patient factors, including patient preferences, co-morbidities, previous drug use, risk factors not captured in the FRAX model (e.g. falls, vitamin D deficiency, increased bone turnover, interval significant decline in bone density) and possible under - or over-estimation of fracture risk by FRAX. All patients should ensure an adequate intake of dietary calcium (1200 mg/d) and vitamin D (800 IU daily) unless contraindicated. FOLLOW-UP: People with diagnosed cases of osteoporosis or at high risk for fracture should have regular bone mineral density tests. For patients eligible for Medicare, routine testing is allowed once every 2 years. The testing frequency can be increased to one year for patients who have rapidly progressing disease, those who are receiving or discontinuing medical therapy to restore bone mass, or have additional risk factors. I have reviewed this report, and agree with the above findings. [facility] Radiology Electronically Signed   By: Lahoma Crocker M.D.   On: 06/04/2015 16:06    Assessment/Plan:   1. Malignant hypertensive urgency -seems this resolved -cause unclear -back pain and headache going on yesterday, but gradually improved overnight -she is doing better this am and ready to go home -bp now in 140s which is satisfactory--expect it to improve further at home -will have her come for twice weekly bp checks with [name] in clinic  2. Acute intractable headache, unspecified headache type -gradually resolving -neurologically intact and CT brain negative for acute  ischemia -will do a few labs when she has her routine visit next week due to the spots on the calvarium, but I'm more suspicious that they are due to osteopenia than myeloma  Patient is being discharged with the following home health services:  none  Patient is being discharged with the following durable medical equipment: cont use of walker as usual   Patient has been advised to f/u with their PCP in 1-2 weeks to bring them up to date on their rehab stay.  Social services at facility was responsible for arranging this appointment.  Pt was provided with a 30 day supply of prescriptions for medications and refills must be obtained from their PCP.  For controlled substances, a more limited supply may be provided adequate until PCP appointment only.  Future labs/tests needed:  Spep/upep, cmp, cbc with diff

## 2015-06-19 ENCOUNTER — Ambulatory Visit (INDEPENDENT_AMBULATORY_CARE_PROVIDER_SITE_OTHER): Payer: Medicare Other | Admitting: Internal Medicine

## 2015-06-19 ENCOUNTER — Encounter: Payer: Self-pay | Admitting: Internal Medicine

## 2015-06-19 ENCOUNTER — Other Ambulatory Visit: Payer: Self-pay | Admitting: Hematology and Oncology

## 2015-06-19 VITALS — BP 142/88 | HR 79 | Temp 98.5°F | Resp 17 | Ht 62.0 in | Wt 112.2 lb

## 2015-06-19 DIAGNOSIS — I10 Essential (primary) hypertension: Secondary | ICD-10-CM

## 2015-06-19 DIAGNOSIS — R51 Headache: Secondary | ICD-10-CM

## 2015-06-19 DIAGNOSIS — R519 Headache, unspecified: Secondary | ICD-10-CM

## 2015-06-19 DIAGNOSIS — R930 Abnormal findings on diagnostic imaging of skull and head, not elsewhere classified: Secondary | ICD-10-CM | POA: Diagnosis not present

## 2015-06-19 DIAGNOSIS — R6889 Other general symptoms and signs: Secondary | ICD-10-CM

## 2015-06-19 NOTE — Patient Instructions (Addendum)
May take ibuprofen 200mg  3 tablets twice a day as needed for headache.    Resume hydralazine 75mg  (1.5 tablets) three times daily for your blood pressure.  Continue to see [name] for bp checks twice a week until I see you.  If you continue to have headaches, you may check with her more often.  Come back tomorrow for labs.

## 2015-06-19 NOTE — Progress Notes (Signed)
Location:  [facility] clinic Provider: Ky Rumple L. Mariea Mays, D.O., C.M.D.  Code Status: DNR Goals of Care:  Advanced Directives 06/19/2015  Does patient have an advance directive? Yes  Type of Paramedic of [place];Living will  Does patient want to make changes to advanced directive? No - Patient declined  Copy of advanced directive(s) in chart? Yes  Pre-existing out of facility DNR order (yellow form or pink MOST form) -   Chief Complaint  Patient presents with  . Acute Visit    Headache since Monday. BP has been high over last several days.     HPI: Patient is a 82 y.o. female seen today for an acute visit for headache since Monday off and on, not major.  BP not at goal--in 140s and usually in 120s.  Back no different than usual.  Says when she takes the oxycodone the ED gave her, it kept her wide awake last night.  Also is already on tylenol.    Saw Dr. Zadie Mays in March, her retina specialist, and she was having bleeding.  She admitted to constipation.  He added fiberlax to her regimen which is helping.      Used to take 1.5 of the hydralazine, but Dr. Aundra Mays had cut to one three times a day due to low bp.    Past Medical History  Diagnosis Date  . PAF (paroxysmal atrial fibrillation) (HCC)     a. on amio (02/2012 mild obstruction on PFT's) and xarelto.  . Essential hypertension   . Mitral valve disorders   . Tricuspid valve disorders, specified as nonrheumatic   . Neck mass     right, w/u including MRI negative  . Hypothyroidism   . Macular degeneration     Left, s/p inj. tx  . Senile osteoporosis     Reclast in the past  . Alopecia, unspecified   . Pure hypercholesterolemia   . Chronic diastolic CHF (congestive heart failure) ([place])     a. 11/2013 Echo: EF 60-65%, no rwma, mild TR, PASP 35mHg.  .[name] KitchenHeadache(784.0)   . Backache, unspecified   . Polycythemia vera(238.4)   . Gouty arthropathy, unspecified   . Insomnia, unspecified   . Other malaise and  fatigue   . Hyposmolality and/or hyponatremia   . Hyperpotassemia   . Edema   . Closed fracture of lower end of radius with ulna   . Hypercalcemia   . Deafness     Left, s/p multiple surgeries  . Abnormal stress test     a. 11/2011 Ex MV: EF 80%, small, partially reversible anteroapical defect->mild ischemia vs attenuation-->med Rx.    Past Surgical History  Procedure Laterality Date  . Breast biopsy  06/18/1998    left  . Total abdominal hysterectomy    . Tonsillectomy    . Breast biopsy    . Tympanoplasty Bilateral   . Ablation  08/28/14    Back     Allergies  Allergen Reactions  . Amlodipine Swelling    Lower extremity edema       Medication List       This list is accurate as of: 06/19/15  3:56 PM.  Always use your most recent med list.               acetaminophen 650 MG CR tablet  Commonly known as:  TYLENOL  Take 650 mg by mouth 3 (three) times daily.     amiodarone 100 MG tablet  Commonly known as:  PACERONE  Take 100 mg by mouth daily.     cholecalciferol 1000 units tablet  Commonly known as:  VITAMIN D  Take 2,000 Units by mouth daily.     fentaNYL 25 MCG/HR patch  Commonly known as:  DURAGESIC - dosed mcg/hr  Place 1 patch (25 mcg total) onto the skin every 3 (three) days.     furosemide 40 MG tablet  Commonly known as:  LASIX  Take by mouth. 1.5 tablets (30m) two times a day     gabapentin 100 MG capsule  Commonly known as:  NEURONTIN  Take 1 capsule (100 mg total) by mouth 3 (three) times daily.     hydrALAZINE 50 MG tablet  Commonly known as:  APRESOLINE  TAKE 1 1/2 TABLETS THREE TIMES DAILY.     hydroxyurea 500 MG capsule  Commonly known as:  HYDREA  TAKE 2 CAPSULES ON MONDAYS AND TAKE 1 CAPSULE ALL OTHER DAYS OF THE WEEK.     levothyroxine 112 MCG tablet  Commonly known as:  SYNTHROID, LEVOTHROID  TAKE 1 TABLET EACH DAY.     Magnesium 250 MG Tabs  Take 250 mg by mouth daily. Reported on 06/19/2015     Melatonin 5 MG Caps    Take 5 mg by mouth at bedtime. Reported on 06/19/2015     oxyCODONE-acetaminophen 5-325 MG tablet  Commonly known as:  PERCOCET/ROXICET  Take 1 tablet by mouth every 4 (four) hours as needed for severe pain.     potassium chloride 10 MEQ tablet  Commonly known as:  K-DUR  Take 2 tablets (20 mEq total) by mouth daily.     PRESERVISION AREDS 2 Caps  Take 1 capsule by mouth 2 (two) times daily. Reported on 06/19/2015     senna-docusate 8.6-50 MG tablet  Commonly known as:  Senokot-S  Take 2 tablets by mouth 2 (two) times daily.     traMADol 50 MG tablet  Commonly known as:  ULTRAM  TAKE 1 OR 2 TABLETS EVERY 6 HOURS AS NEEDED FOR PAIN     XARELTO 15 MG Tabs tablet  Generic drug:  Rivaroxaban  Take 15 mg by mouth daily with supper.        Review of Systems:  Review of Systems  Constitutional: Negative for fever and chills.  HENT: Positive for hearing loss. Negative for congestion.        Headache new and associated with newly high bp  Eyes: Positive for blurred vision.       Left eye  Respiratory: Negative for shortness of breath.   Cardiovascular: Negative for chest pain and palpitations.  Gastrointestinal: Negative for abdominal pain, constipation, blood in stool and melena.  Genitourinary: Negative for dysuria.  Musculoskeletal: Positive for back pain. Negative for falls.  Neurological: Positive for headaches. Negative for dizziness, loss of consciousness and weakness.  Psychiatric/Behavioral: Negative for depression and memory loss.    Health Maintenance  Topic Date Due  . TETANUS/TDAP  08/03/1949  . INFLUENZA VACCINE  09/09/2015  . DEXA SCAN  Completed  . ZOSTAVAX  Completed  . PNA vac Low Risk Adult  Completed    Physical Exam: Filed Vitals:   06/19/15 1545  BP: 142/88  Pulse: 79  Temp: 98.5 F (36.9 C)  TempSrc: Oral  Resp: 17  Height: _0  (1.575 m)  Weight: 112 lb 3.2 oz (50.894 kg)  SpO2: 92%   Body mass index is 20.52 kg/(m^2). Physical Exam   Constitutional: She is oriented to person, place, and  time. She appears well-developed and well-nourished. No distress.  HENT:  Head: Normocephalic and atraumatic.  Cardiovascular: Normal rate and regular rhythm.   Pulmonary/Chest: Effort normal and breath sounds normal.  Musculoskeletal: Normal range of motion.  Ambulates with walker  Neurological: She is alert and oriented to person, place, and time.  Skin: Skin is warm and dry.  Psychiatric: She has a normal mood and affect.    Labs reviewed: Basic Metabolic Panel:  Recent Labs  08/15/14 1651  09/02/14 0641 09/25/14 1151 10/29/14 11/29/14 1518  NA  --   < > 137 136 137 138  K  --   < > 3.4* 4.1 4.0 3.8  CL  --   < > 101 98  --  99  CO2  --   < > 28 31  --  26  GLUCOSE  --   < > 90 70  --  64*  BUN  --   < > 21* 16 22* 26*  CREATININE  --   < > 0.86 0.83 0.9 0.87  CALCIUM  --   < > 9.9 10.5  --  10.6*  TSH 1.14  --   --   --  0.72  --   < > = values in this interval not displayed. Liver Function Tests:  Recent Labs  09/01/14 1945 09/02/14 0641 11/29/14 1518  AST _0 ALT _1 ALKPHOS 59 52 61  BILITOT 0.5 0.9 0.7  PROT 6.2* 5.9* 6.8  ALBUMIN 3.9 3.8 4.3   No results for input(s): LIPASE, AMYLASE in the last 8760 hours. No results for input(s): AMMONIA in the last 8760 hours. CBC:  Recent Labs  09/02/14 0641 10/29/14 11/07/14 1247 05/09/15 1445  WBC 12.4* 8.3 8.3 9.5  NEUTROABS 9.9*  --  5.7 6.5  HGB 13.0 13.7 14.1 14.1  HCT 39.1 40 42.3 41.9  MCV 115.7*  --  113.1* 114.2*  PLT 425* 384 382 498*   Lipid Panel:  Recent Labs  10/29/14  CHOL 164  HDL 78*  LDLCALC 69  TRIG 133   No results found for: HGBA1C  Procedures since last visit: Ct Head Wo Contrast  06/16/2015  CLINICAL DATA:  Headache all day today. EXAM: CT HEAD WITHOUT CONTRAST TECHNIQUE: Contiguous axial images were obtained from the base of the skull through the vertex without contrast. COMPARISON:  None FINDINGS: Mild  cerebral atrophy. No evidence for acute hemorrhage, mass lesion, midline shift, hydrocephalus or large infarct. Fluid in the right mastoid air cells. Deformity along the medial left orbital wall probably represents an old injury. No acute calvarial fracture. Extensive degenerative changes around the odontoid process. There are innumerable small lucent foci throughout the calvarium. IMPRESSION: No acute intracranial abnormality. Scattered small foci of lucency throughout the calvarium are nonspecific. Findings could be secondary to osteopenia. A neoplastic process such as multiple myeloma is thought to be less likely but cannot be completely excluded. Fluid in the right mastoid air cells. Electronically Signed   By: Markus Daft M.D.   On: 06/16/2015 21:04   Dg Bone Density  06/04/2015  EXAM: DUAL X-RAY ABSORPTIOMETRY (DXA) FOR BONE MINERAL DENSITY IMPRESSION: Referring Physician:  Hollace Kinnier PATIENT: Name: Sarah Mays, Sarah Mays Patient ID: [id number] Birth Date: 1930/06/28 Height: 59.7 in. Sex: Female Measured: 06/04/2015 Weight: 114.0 lbs. Indications: Advanced Age, Bilateral Ovariectomy (65.51), Caucasian, Estrogen Deficient, Gabapentin, Height Loss (781.91), History of Fracture (Adult) (V15.51), Hypothyroid, Hysterectomy, Low Body Weight (783.22), Postmenopausal, Synthroid Fractures: Wrist Treatments:  Vitamin D (E933.5) ASSESSMENT: The BMD measured at Forearm Radius 33% is 0.487 g/cm2 with a T-score of -4.5. This patient is considered osteoporotic according to [facility]) criteria. Site Region Measured Date Measured Age YA BMD Significant CHANGE T-score Right Forearm Radius 33% 06/04/2015 82.8 -4.7 0.487 g/cm2 DualFemur Total Left 06/04/2015 82.8 -6.0 0.498 g/cm2 World Health Organization [facility]) criteria for post-menopausal, Caucasian Women: Normal       T-score at or above -1 SD Osteopenia   T-score between -1 and -2.5 SD Osteoporosis T-score at or below -2.5 SD RECOMMENDATION: Energy recommends that FDA-approved medical therapies be considered in postmenopausal women and men age 89 or older with a: 1. Hip or vertebral (clinical or morphometric) fracture. 2. T-score of <-2.5 at the spine or hip. 3. Ten-year fracture probability by FRAX of 3% or greater for hip fracture or 20% or greater for major osteoporotic fracture. All treatment decisions require clinical judgment and consideration of individual patient factors, including patient preferences, co-morbidities, previous drug use, risk factors not captured in the FRAX model (e.g. falls, vitamin D deficiency, increased bone turnover, interval significant decline in bone density) and possible under - or over-estimation of fracture risk by FRAX. All patients should ensure an adequate intake of dietary calcium (1200 mg/d) and vitamin D (800 IU daily) unless contraindicated. FOLLOW-UP: People with diagnosed cases of osteoporosis or at high risk for fracture should have regular bone mineral density tests. For patients eligible for Medicare, routine testing is allowed once every 2 years. The testing frequency can be increased to one year for patients who have rapidly progressing disease, those who are receiving or discontinuing medical therapy to restore bone mass, or have additional risk factors. I have reviewed this report, and agree with the above findings. [facility] Radiology Electronically Signed   By: Lahoma Crocker M.D.   On: 06/04/2015 16:06    Assessment/Plan 1. Essential hypertension -had been well controlled until past week or so -increase hydralazine back to 88m po tid at least for now and monitor -cont other meds the same - Comprehensive metabolic panel; Future - CBC with Differential/Platelet; Future  2. Acute nonintractable headache, unspecified headache type -advised she may use ibuprofen short term in instructions -cont tylenol she uses for her back pain - Comprehensive metabolic panel; Future - CBC  with Differential/Platelet; Future  3. Abnormal findings-skull or head -I suspect this is due to osteoporosis, but with new headache, chronic back pain, slight Ca elevation, do want to rule out multiple myeloma so she will come back in am for labs (nonfasting) - Comprehensive metabolic panel; Future - CBC with Differential/Platelet; Future - Beta 2 microglobulin, serum; Future - Protein electrophoresis, serum; Future - Protein Electrophoresis, Urine Rflx.; Future  Labs/tests ordered:   Orders Placed This Encounter  Procedures  . Comprehensive metabolic panel    Standing Status: Future     Number of Occurrences:      Standing Expiration Date: 07/10/2015  . CBC with Differential/Platelet    Standing Status: Future     Number of Occurrences:      Standing Expiration Date: 07/10/2015  . Beta 2 microglobulin, serum    Standing Status: Future     Number of Occurrences:      Standing Expiration Date: 07/10/2015  . Protein electrophoresis, serum    Standing Status: Future     Number of Occurrences:      Standing Expiration Date: 07/10/2015  . Protein Electrophoresis, Urine Rflx.    Standing  Status: Future     Number of Occurrences:      Standing Expiration Date: 07/10/2015   Next appt:  06/25/2015  Emmalynn Pinkham L. Vinette Crites, D.O. [facility] [address], [place] [address] Cell Phone (Mon-Fri 8am-5pm):  [phone] On Call:  [phone] & follow prompts after 5pm & weekends Office Phone:  [phone] Office Fax:  [phone]

## 2015-06-20 ENCOUNTER — Other Ambulatory Visit: Payer: Medicare Other

## 2015-06-20 DIAGNOSIS — R930 Abnormal findings on diagnostic imaging of skull and head, not elsewhere classified: Secondary | ICD-10-CM | POA: Diagnosis not present

## 2015-06-20 DIAGNOSIS — I10 Essential (primary) hypertension: Secondary | ICD-10-CM

## 2015-06-20 DIAGNOSIS — R51 Headache: Secondary | ICD-10-CM | POA: Diagnosis not present

## 2015-06-20 DIAGNOSIS — R519 Headache, unspecified: Secondary | ICD-10-CM

## 2015-06-20 DIAGNOSIS — R6889 Other general symptoms and signs: Secondary | ICD-10-CM

## 2015-06-24 LAB — PROTEIN ELECTROPHORESIS, SERUM
A/G Ratio: 2 — ABNORMAL HIGH (ref 0.7–1.7)
Albumin ELP: 4.1 g/dL (ref 2.9–4.4)
Alpha 1: 0.2 g/dL (ref 0.0–0.4)
Alpha 2: 0.5 g/dL (ref 0.4–1.0)
Beta: 0.8 g/dL (ref 0.7–1.3)
Gamma Globulin: 0.5 g/dL (ref 0.4–1.8)
Globulin, Total: 2.1 g/dL — ABNORMAL LOW (ref 2.2–3.9)

## 2015-06-24 LAB — COMPREHENSIVE METABOLIC PANEL
ALT: 11 IU/L (ref 0–32)
AST: 17 IU/L (ref 0–40)
Albumin/Globulin Ratio: 2.9 — ABNORMAL HIGH (ref 1.2–2.2)
Albumin: 4.6 g/dL (ref 3.5–4.7)
Alkaline Phosphatase: 72 IU/L (ref 39–117)
BUN/Creatinine Ratio: 26 (ref 12–28)
BUN: 24 mg/dL (ref 8–27)
Bilirubin Total: 0.7 mg/dL (ref 0.0–1.2)
CO2: 25 mmol/L (ref 18–29)
Calcium: 11.3 mg/dL — ABNORMAL HIGH (ref 8.7–10.3)
Chloride: 98 mmol/L (ref 96–106)
Creatinine, Ser: 0.94 mg/dL (ref 0.57–1.00)
GFR calc Af Amer: 65 mL/min/{1.73_m2} (ref >59)
GFR calc non Af Amer: 57 mL/min/{1.73_m2} — ABNORMAL LOW (ref >59)
Globulin, Total: 1.6 g/dL (ref 1.5–4.5)
Glucose: 79 mg/dL (ref 65–99)
Potassium: 4.6 mmol/L (ref 3.5–5.2)
Sodium: 139 mmol/L (ref 134–144)
Total Protein: 6.2 g/dL (ref 6.0–8.5)

## 2015-06-24 LAB — CBC WITH DIFFERENTIAL/PLATELET
Basophils Absolute: 0.1 10*3/uL (ref 0.0–0.2)
Basos: 1 %
EOS (ABSOLUTE): 0.2 10*3/uL (ref 0.0–0.4)
Eos: 2 %
Hematocrit: 41.5 % (ref 34.0–46.6)
Hemoglobin: 13.9 g/dL (ref 11.1–15.9)
Immature Grans (Abs): 0 10*3/uL (ref 0.0–0.1)
Immature Granulocytes: 0 %
Lymphocytes Absolute: 1.6 10*3/uL (ref 0.7–3.1)
Lymphs: 15 %
MCH: 37.7 pg — ABNORMAL HIGH (ref 26.6–33.0)
MCHC: 33.5 g/dL (ref 31.5–35.7)
MCV: 113 fL — ABNORMAL HIGH (ref 79–97)
Monocytes Absolute: 0.8 10*3/uL (ref 0.1–0.9)
Monocytes: 7 %
Neutrophils Absolute: 8 10*3/uL — ABNORMAL HIGH (ref 1.4–7.0)
Neutrophils: 75 %
Platelets: 487 10*3/uL — ABNORMAL HIGH (ref 150–379)
RBC: 3.69 x10E6/uL — ABNORMAL LOW (ref 3.77–5.28)
RDW: 16.1 % — ABNORMAL HIGH (ref 12.3–15.4)
WBC: 10.8 10*3/uL (ref 3.4–10.8)

## 2015-06-24 LAB — PROTEIN ELECTROPHORESIS, URINE REFLEX
Albumin ELP, Urine: 100 %
Alpha-1-Globulin, U: 0 %
Alpha-2-Globulin, U: 0 %
Beta Globulin, U: 0 %
Gamma Globulin, U: 0 %
Protein, Ur: 7.8 mg/dL

## 2015-06-24 LAB — BETA 2 MICROGLOBULIN, SERUM: Beta-2: 2.9 mg/L — ABNORMAL HIGH (ref 0.6–2.4)

## 2015-06-25 ENCOUNTER — Non-Acute Institutional Stay: Payer: Medicare Other | Admitting: Internal Medicine

## 2015-06-25 ENCOUNTER — Encounter: Payer: Self-pay | Admitting: Internal Medicine

## 2015-06-25 VITALS — BP 120/60 | HR 78 | Temp 98.4°F | Wt 116.0 lb

## 2015-06-25 DIAGNOSIS — I10 Essential (primary) hypertension: Secondary | ICD-10-CM | POA: Diagnosis not present

## 2015-06-25 DIAGNOSIS — G8929 Other chronic pain: Secondary | ICD-10-CM

## 2015-06-25 DIAGNOSIS — R51 Headache: Secondary | ICD-10-CM | POA: Diagnosis not present

## 2015-06-25 DIAGNOSIS — R519 Headache, unspecified: Secondary | ICD-10-CM

## 2015-06-25 DIAGNOSIS — M810 Age-related osteoporosis without current pathological fracture: Secondary | ICD-10-CM

## 2015-06-25 MED ORDER — DENOSUMAB 60 MG/ML ~~LOC~~ SOLN
60.0000 mg | Freq: Once | SUBCUTANEOUS | Status: AC
  Administered 2015-06-25: 60 mg via SUBCUTANEOUS

## 2015-06-25 NOTE — Progress Notes (Signed)
Location:  Occupational psychologist of Service:  Clinic (12)  Provider: Fredricka Kohrs L. Mariea Clonts, D.O., C.M.D.  Code Status: DNR Goals of Care:  Advanced Directives 06/19/2015  Does patient have an advance directive? Yes  Type of Paramedic of [place];Living will  Does patient want to make changes to advanced directive? No - Patient declined  Copy of advanced directive(s) in chart? Yes  Pre-existing out of facility DNR order (yellow form or pink MOST form) -     Chief Complaint  Patient presents with  . Medical Management of Chronic Issues    follow-up    HPI: Patient is a 82 y.o. female seen today for f/u on abnormality on CT brain which led to further lab testing and for her prolia injection for senile osteoporosis.  We discussed last time that there were some areas of thinning noted on the calvarium of her CT and with her recent headaches and chronic back pain, some elevated calcium, we opted to do a myeloma workup which appears negative thus far.  No M-spike seen.  I'm still waiting on the final paper copy from the lab at my main office.     Still having some headaches.  Took one aleve the other night which did get rid of it when it was bad at 4AM.  Had a slight headache today which calmed down to almost nothing with tylenol and headache medication.  Not losing weight that she's aware of.    Past Medical History  Diagnosis Date  . PAF (paroxysmal atrial fibrillation) (HCC)     a. on amio (02/2012 mild obstruction on PFT's) and xarelto.  . Essential hypertension   . Mitral valve disorders   . Tricuspid valve disorders, specified as nonrheumatic   . Neck mass     right, w/u including MRI negative  . Hypothyroidism   . Macular degeneration     Left, s/p inj. tx  . Senile osteoporosis     Reclast in the past  . Alopecia, unspecified   . Pure hypercholesterolemia   . Chronic diastolic CHF (congestive heart failure) ([place])     a. 11/2013  Echo: EF 60-65%, no rwma, mild TR, PASP 91mHg.  .[name] KitchenHeadache(784.0)   . Backache, unspecified   . Polycythemia vera(238.4)   . Gouty arthropathy, unspecified   . Insomnia, unspecified   . Other malaise and fatigue   . Hyposmolality and/or hyponatremia   . Hyperpotassemia   . Edema   . Closed fracture of lower end of radius with ulna   . Hypercalcemia   . Deafness     Left, s/p multiple surgeries  . Abnormal stress test     a. 11/2011 Ex MV: EF 80%, small, partially reversible anteroapical defect->mild ischemia vs attenuation-->med Rx.    Past Surgical History  Procedure Laterality Date  . Breast biopsy  06/18/1998    left  . Total abdominal hysterectomy    . Tonsillectomy    . Breast biopsy    . Tympanoplasty Bilateral   . Ablation  08/28/14    Back     Allergies  Allergen Reactions  . Amlodipine Swelling    Lower extremity edema       Medication List       This list is accurate as of: 06/25/15  3:52 PM.  Always use your most recent med list.               acetaminophen 650 MG CR tablet  Commonly known as:  TYLENOL  Take 650 mg by mouth 3 (three) times daily.     amiodarone 100 MG tablet  Commonly known as:  PACERONE  Take 100 mg by mouth daily.     cholecalciferol 1000 units tablet  Commonly known as:  VITAMIN D  Take 2,000 Units by mouth daily.     fentaNYL 25 MCG/HR patch  Commonly known as:  DURAGESIC - dosed mcg/hr  Place 1 patch (25 mcg total) onto the skin every 3 (three) days.     furosemide 40 MG tablet  Commonly known as:  LASIX  Take by mouth. 1.5 tablets ('60mg'$ ) two times a day     hydrALAZINE 50 MG tablet  Commonly known as:  APRESOLINE  TAKE 1 1/2 TABLETS THREE TIMES DAILY.     levothyroxine 112 MCG tablet  Commonly known as:  SYNTHROID, LEVOTHROID  TAKE 1 TABLET EACH DAY.     Magnesium 250 MG Tabs  Take 250 mg by mouth daily. Reported on 06/19/2015     Melatonin 5 MG Caps  Take 5 mg by mouth at bedtime. Reported on 06/19/2015       oxyCODONE-acetaminophen 5-325 MG tablet  Commonly known as:  PERCOCET/ROXICET  Take 1 tablet by mouth every 4 (four) hours as needed for severe pain.     potassium chloride 10 MEQ tablet  Commonly known as:  K-DUR  Take 2 tablets (20 mEq total) by mouth daily.     PRESERVISION AREDS 2 Caps  Take 1 capsule by mouth 2 (two) times daily. Reported on 06/19/2015     senna-docusate 8.6-50 MG tablet  Commonly known as:  Senokot-S  Take 2 tablets by mouth 2 (two) times daily.     traMADol 50 MG tablet  Commonly known as:  ULTRAM  TAKE 1 OR 2 TABLETS EVERY 6 HOURS AS NEEDED FOR PAIN     XARELTO 15 MG Tabs tablet  Generic drug:  Rivaroxaban  Take 15 mg by mouth daily with supper.       Review of Systems:  Review of Systems  Constitutional: Negative for fever, chills, weight loss and malaise/fatigue.  HENT: Positive for hearing loss.   Eyes: Negative for blurred vision.  Respiratory: Negative for shortness of breath.   Cardiovascular: Negative for chest pain and leg swelling.  Gastrointestinal: Negative for abdominal pain, constipation, blood in stool and melena.  Genitourinary: Negative for dysuria.  Musculoskeletal: Positive for back pain. Negative for falls.  Skin: Negative for rash.  Neurological: Positive for headaches. Negative for dizziness, loss of consciousness and weakness.  Psychiatric/Behavioral: Negative for depression and memory loss.    Health Maintenance  Topic Date Due  . TETANUS/TDAP  08/03/1949  . INFLUENZA VACCINE  09/09/2015  . DEXA SCAN  Completed  . ZOSTAVAX  Completed  . PNA vac Low Risk Adult  Completed    Physical Exam: Filed Vitals:   06/25/15 1549  Weight: 116 lb (52.617 kg)   Body mass index is 21.21 kg/(m^2). Physical Exam  Constitutional: She is oriented to person, place, and time. She appears well-developed and well-nourished. No distress.  Cardiovascular: Normal rate, regular rhythm, normal heart sounds and intact distal pulses.    Pulmonary/Chest: Effort normal and breath sounds normal. No respiratory distress.  Abdominal: Soft. Bowel sounds are normal. She exhibits no distension. There is no tenderness.  Musculoskeletal: She exhibits tenderness.  Lower lumbar and SI regions  Neurological: She is alert and oriented to person, place, and time. She has  normal reflexes. She displays normal reflexes. No cranial nerve deficit. She exhibits normal muscle tone. Coordination normal.  Skin: Skin is warm and dry.  Psychiatric: She has a normal mood and affect.    Labs reviewed: Basic Metabolic Panel:  Recent Labs  08/15/14 1651  09/25/14 1151 10/29/14 11/29/14 1518 06/20/15 0903  NA  --   < > 136 137 138 139  K  --   < > 4.1 4.0 3.8 4.6  CL  --   < > 98  --  99 98  CO2  --   < > 31  --  26 25  GLUCOSE  --   < > 70  --  64* 79  BUN  --   < > 16 22* 26* 24  CREATININE  --   < > 0.83 0.9 0.87 0.94  CALCIUM  --   < > 10.5  --  10.6* 11.3*  TSH 1.14  --   --  0.72  --   --   < > = values in this interval not displayed. Liver Function Tests:  Recent Labs  09/02/14 0641 11/29/14 1518 06/20/15 0903  AST _0 ALT _1 ALKPHOS 52 61 72  BILITOT 0.9 0.7 0.7  PROT 5.9* 6.8 6.2  ALBUMIN 3.8 4.3 4.6   No results for input(s): LIPASE, AMYLASE in the last 8760 hours. No results for input(s): AMMONIA in the last 8760 hours. CBC:  Recent Labs  10/29/14 11/07/14 1247 05/09/15 1445 06/20/15 0903  WBC 8.3 8.3 9.5 10.8  NEUTROABS  --  5.7 6.5 8.0*  HGB 13.7 14.1 14.1  --   HCT 40 42.3 41.9 41.5  MCV  --  113.1* 114.2* 113*  PLT 384 382 498* 487*   Lipid Panel:  Recent Labs  10/29/14  CHOL 164  HDL 78*  LDLCALC 69  TRIG 133   No results found for: HGBA1C  Procedures since last visit: Ct Head Wo Contrast  06/16/2015  CLINICAL DATA:  Headache all day today. EXAM: CT HEAD WITHOUT CONTRAST TECHNIQUE: Contiguous axial images were obtained from the base of the skull through the vertex without  contrast. COMPARISON:  None FINDINGS: Mild cerebral atrophy. No evidence for acute hemorrhage, mass lesion, midline shift, hydrocephalus or large infarct. Fluid in the right mastoid air cells. Deformity along the medial left orbital wall probably represents an old injury. No acute calvarial fracture. Extensive degenerative changes around the odontoid process. There are innumerable small lucent foci throughout the calvarium. IMPRESSION: No acute intracranial abnormality. Scattered small foci of lucency throughout the calvarium are nonspecific. Findings could be secondary to osteopenia. A neoplastic process such as multiple myeloma is thought to be less likely but cannot be completely excluded. Fluid in the right mastoid air cells. Electronically Signed   By: Markus Daft M.D.   On: 06/16/2015 21:04   Dg Bone Density  06/04/2015  EXAM: DUAL X-RAY ABSORPTIOMETRY (DXA) FOR BONE MINERAL DENSITY IMPRESSION: Referring Physician:  Hollace Kinnier PATIENT: Name: NAMITA, YEARWOOD Patient ID: [id number] Birth Date: [birth year] Height: 59.7 in. Sex: Female Measured: 06/04/2015 Weight: 114.0 lbs. Indications: Advanced Age, Bilateral Ovariectomy (65.51), Caucasian, Estrogen Deficient, Gabapentin, Height Loss (781.91), History of Fracture (Adult) (V15.51), Hypothyroid, Hysterectomy, Low Body Weight (783.22), Postmenopausal, Synthroid Fractures: Wrist Treatments: Vitamin D (E933.5) ASSESSMENT: The BMD measured at Forearm Radius 33% is 0.487 g/cm2 with a T-score of -4.5. This patient is considered osteoporotic according to [facility]) criteria. Site Region Measured  Date Measured Age YA BMD Significant CHANGE T-score Right Forearm Radius 33% 06/04/2015 82.8 -4.5 0.487 g/cm2 DualFemur Total Left 06/04/2015 82.8 -4.0 0.498 g/cm2 World Health Organization [facility]) criteria for post-menopausal, Caucasian Women: Normal       T-score at or above -1 SD Osteopenia   T-score between -1 and -2.5 SD Osteoporosis T-score at or  below -2.5 SD RECOMMENDATION: [place] recommends that FDA-approved medical therapies be considered in postmenopausal women and men age 72 or older with a: 1. Hip or vertebral (clinical or morphometric) fracture. 2. T-score of <-2.5 at the spine or hip. 3. Ten-year fracture probability by FRAX of 3% or greater for hip fracture or 20% or greater for major osteoporotic fracture. All treatment decisions require clinical judgment and consideration of individual patient factors, including patient preferences, co-morbidities, previous drug use, risk factors not captured in the FRAX model (e.g. falls, vitamin D deficiency, increased bone turnover, interval significant decline in bone density) and possible under - or over-estimation of fracture risk by FRAX. All patients should ensure an adequate intake of dietary calcium (1200 mg/d) and vitamin D (800 IU daily) unless contraindicated. FOLLOW-UP: People with diagnosed cases of osteoporosis or at high risk for fracture should have regular bone mineral density tests. For patients eligible for Medicare, routine testing is allowed once every 2 years. The testing frequency can be increased to one year for patients who have rapidly progressing disease, those who are receiving or discontinuing medical therapy to restore bone mass, or have additional risk factors. I have reviewed this report, and agree with the above findings. [facility] Radiology Electronically Signed   By: Lahoma Crocker M.D.   On: 06/04/2015 16:06    Assessment/Plan 1. Senile osteoporosis -first prolia given today -cont vitamin D -has some prior hypercalcemia so no calcium supplement--continues balanced diet -suspect abnormal appearance of calvarium is due to osteoporosis NOT myeloma based on labs  2.  Chronic headache -comes and goes some, but not nearly so bad now that bp is controlled  3.  Benign essential htn -now controlled -no dizziness -cont same regimen and  monitor -is getting checked with [name]  Labs/tests ordered:  No new Next appt:  3 mos med mgt  Jamiyla Ishee L. Hatley Henegar, D.O. [facility] [address], [place] [address] Cell Phone (Mon-Fri 8am-5pm):  [phone] On Call:  [phone] & follow prompts after 5pm & weekends Office Phone:  [phone] Office Fax:  [phone]

## 2015-07-01 ENCOUNTER — Encounter (HOSPITAL_COMMUNITY): Payer: Self-pay | Admitting: Nurse Practitioner

## 2015-07-01 ENCOUNTER — Telehealth: Payer: Self-pay | Admitting: Cardiology

## 2015-07-01 ENCOUNTER — Ambulatory Visit (HOSPITAL_COMMUNITY)
Admission: RE | Admit: 2015-07-01 | Discharge: 2015-07-01 | Disposition: A | Discharge status: Home / Self Care | Payer: Medicare Other | Source: Ambulatory Visit | Attending: Nurse Practitioner | Admitting: Nurse Practitioner

## 2015-07-01 VITALS — BP 128/62 | HR 118 | Ht 62.0 in | Wt 114.0 lb

## 2015-07-01 DIAGNOSIS — Z8041 Family history of malignant neoplasm of ovary: Secondary | ICD-10-CM | POA: Diagnosis not present

## 2015-07-01 DIAGNOSIS — Z87891 Personal history of nicotine dependence: Secondary | ICD-10-CM | POA: Diagnosis not present

## 2015-07-01 DIAGNOSIS — I5032 Chronic diastolic (congestive) heart failure: Secondary | ICD-10-CM | POA: Insufficient documentation

## 2015-07-01 DIAGNOSIS — Z79891 Long term (current) use of opiate analgesic: Secondary | ICD-10-CM | POA: Insufficient documentation

## 2015-07-01 DIAGNOSIS — Z9889 Other specified postprocedural states: Secondary | ICD-10-CM | POA: Diagnosis not present

## 2015-07-01 DIAGNOSIS — Z8489 Family history of other specified conditions: Secondary | ICD-10-CM | POA: Insufficient documentation

## 2015-07-01 DIAGNOSIS — I480 Paroxysmal atrial fibrillation: Secondary | ICD-10-CM

## 2015-07-01 DIAGNOSIS — H9192 Unspecified hearing loss, left ear: Secondary | ICD-10-CM | POA: Insufficient documentation

## 2015-07-01 DIAGNOSIS — Z800 Family history of malignant neoplasm of digestive organs: Secondary | ICD-10-CM | POA: Diagnosis not present

## 2015-07-01 DIAGNOSIS — Z79899 Other long term (current) drug therapy: Secondary | ICD-10-CM | POA: Insufficient documentation

## 2015-07-01 DIAGNOSIS — I4892 Unspecified atrial flutter: Secondary | ICD-10-CM | POA: Insufficient documentation

## 2015-07-01 DIAGNOSIS — I4430 Unspecified atrioventricular block: Secondary | ICD-10-CM | POA: Diagnosis not present

## 2015-07-01 DIAGNOSIS — Z7901 Long term (current) use of anticoagulants: Secondary | ICD-10-CM | POA: Insufficient documentation

## 2015-07-01 DIAGNOSIS — E039 Hypothyroidism, unspecified: Secondary | ICD-10-CM | POA: Diagnosis not present

## 2015-07-01 DIAGNOSIS — Z8249 Family history of ischemic heart disease and other diseases of the circulatory system: Secondary | ICD-10-CM | POA: Diagnosis not present

## 2015-07-01 DIAGNOSIS — I4891 Unspecified atrial fibrillation: Secondary | ICD-10-CM | POA: Diagnosis present

## 2015-07-01 DIAGNOSIS — H3530 Unspecified macular degeneration: Secondary | ICD-10-CM | POA: Diagnosis not present

## 2015-07-01 DIAGNOSIS — E7800 Pure hypercholesterolemia, unspecified: Secondary | ICD-10-CM | POA: Diagnosis not present

## 2015-07-01 DIAGNOSIS — I110 Hypertensive heart disease with heart failure: Secondary | ICD-10-CM | POA: Diagnosis not present

## 2015-07-01 DIAGNOSIS — Z803 Family history of malignant neoplasm of breast: Secondary | ICD-10-CM | POA: Diagnosis not present

## 2015-07-01 MED ORDER — AMIODARONE HCL 200 MG PO TABS: 200.0000 mg | ORAL_TABLET | Freq: Two times a day (BID) | ORAL | Status: DC

## 2015-07-01 NOTE — Progress Notes (Signed)
Patient ID: Sarah Mays, female   DOB: [birth year], 82 y.o.   MRN: [id number]      Primary Care Physician: Hollace Kinnier, DO Referring Physician: [facility] triage Cardiologist: Dr. Allie Bossier is a 82 y.o. female with a h/o PAF, initially dx'ed 10/13, that has been staying in SR with 100 mg of amiodarone , h/o of bradycardia in SR and amiodarone was decreased to 100 mg in October 2016 for HR of 48.   She woke up this am with H/A and irregular heart beat, spoke to triage at [facility] and was referred to clinic. In the afib clinic, she is in a flutter with variable AV block at 118 bpm. She states no change in her usual health. H/A's are somewhat chonic in nature for which she takes tylenol, but if especially painful, she has been told to take aleve sparingly.States no missed xarelto in the past. Afib has been associated with triggering acute on chronic diastolic heart failure. So far no PND/orthopnea, fluid status appears stable. Weight is 114 and stable. Was seen in the ER 5/8 for H/A .  Today, she denies symptoms of palpitations, chest pain, shortness of breath, orthopnea, PND, lower extremity edema, dizziness, presyncope, syncope, or neurologic sequela. The patient is tolerating medications without difficulties and is otherwise without complaint today.   Past Medical History  Diagnosis Date  . PAF (paroxysmal atrial fibrillation) (HCC)     a. on amio (02/2012 mild obstruction on PFT's) and xarelto.  . Essential hypertension   . Mitral valve disorders   . Tricuspid valve disorders, specified as nonrheumatic   . Neck mass     right, w/u including MRI negative  . Hypothyroidism   . Macular degeneration     Left, s/p inj. tx  . Senile osteoporosis     Reclast in the past  . Alopecia, unspecified   . Pure hypercholesterolemia   . Chronic diastolic CHF (congestive heart failure) ([place])     a. 11/2013 Echo: EF 60-65%, no rwma, mild TR, PASP 42mmHg.  [name] Kitchen Headache(784.0)   . Backache,  unspecified   . Polycythemia vera(238.4)   . Gouty arthropathy, unspecified   . Insomnia, unspecified   . Other malaise and fatigue   . Hyposmolality and/or hyponatremia   . Hyperpotassemia   . Edema   . Closed fracture of lower end of radius with ulna   . Hypercalcemia   . Deafness     Left, s/p multiple surgeries  . Abnormal stress test     a. 11/2011 Ex MV: EF 80%, small, partially reversible anteroapical defect->mild ischemia vs attenuation-->med Rx.   Past Surgical History  Procedure Laterality Date  . Breast biopsy  06/18/1998    left  . Total abdominal hysterectomy    . Tonsillectomy    . Breast biopsy    . Tympanoplasty Bilateral   . Ablation  08/28/14    Back     Current Outpatient Prescriptions  Medication Sig Dispense Refill  . acetaminophen (TYLENOL) 650 MG CR tablet Take 650 mg by mouth 3 (three) times daily.    [name] Kitchen amiodarone (PACERONE) 200 MG tablet Take 1 tablet (200 mg total) by mouth 2 (two) times daily.    . cholecalciferol (VITAMIN D) 1000 UNITS tablet Take 2,000 Units by mouth daily.     . fentaNYL (DURAGESIC - DOSED MCG/HR) 25 MCG/HR patch Place 1 patch (25 mcg total) onto the skin every 3 (three) days. 10 patch 0  . furosemide (LASIX)  40 MG tablet Take by mouth. 1.5 tablets (60mg ) two times a day    . gabapentin (NEURONTIN) 100 MG capsule Take 100 mg by mouth daily.    . hydrALAZINE (APRESOLINE) 50 MG tablet TAKE 1 1/2 TABLETS THREE TIMES DAILY. 135 tablet 2  . hydroxyurea (HYDREA) 500 MG capsule Take 500 mg by mouth daily. May take with food to minimize GI side effects.    [name] Kitchen levothyroxine (SYNTHROID, LEVOTHROID) 112 MCG tablet TAKE 1 TABLET EACH DAY. 30 tablet 3  . Magnesium 250 MG TABS Take 250 mg by mouth daily. Reported on 06/19/2015    . Melatonin 5 MG CAPS Take 5 mg by mouth at bedtime. Reported on 06/19/2015    . Multiple Vitamins-Minerals (PRESERVISION AREDS 2) CAPS Take 1 capsule by mouth 2 (two) times daily. Reported on 06/19/2015    . potassium  chloride (K-DUR) 10 MEQ tablet Take 2 tablets (20 mEq total) by mouth daily. 60 tablet 10  . Rivaroxaban (XARELTO) 15 MG TABS tablet Take 15 mg by mouth daily with supper.    . senna-docusate (SENOKOT-S) 8.6-50 MG per tablet Take 2 tablets by mouth 2 (two) times daily. 120 tablet 3  . traMADol (ULTRAM) 50 MG tablet TAKE 1 OR 2 TABLETS EVERY 6 HOURS AS NEEDED FOR PAIN 60 tablet 0   No current facility-administered medications for this encounter.    Allergies  Allergen Reactions  . Amlodipine Swelling    Lower extremity edema     Social History   Social History  . Marital Status: Married    Spouse Name: [name]  . Number of Children: 2  . Years of Education: 16   Occupational History  . Not on file.   Social History Main Topics  . Smoking status: Former Smoker    Types: Cigarettes    Quit date: 01/19/1984  . Smokeless tobacco: Never Used  . Alcohol Use: 0.6 oz/week    1 Glasses of wine per week     Comment: 2 per day/ 14 a week  . Drug Use: No  . Sexual Activity: Not Currently   Other Topics Concern  . Not on file   Social History Narrative   Patient is Married 1955. 2 kids. 8 grandkid-20 years old in 2016. College graduate [name]).    Lives in apartment,  Independent Living  section at [place] since 02/2013.      Stay at home mother.       No Smoking history, Mod. alcohol use.   Patient has a living will, POA      Hobbies: CPU and painting             Family History  Problem Relation Age of Onset  . Hypertension Father   . Heart disease Father     patient does not know details.   . Breast cancer Daughter   . Cancer Daughter     breast  . Ovarian cancer Mother 37  . Cancer Sister     colon  . Heart disease Sister   . Cancer Sister   . Cancer Sister     hodgkin disease    ROS- All systems are reviewed and negative except as per the HPI above  Physical Exam: Filed Vitals:   07/01/15 1354  BP: 128/62  Pulse: 118    Height: 5\' 2"  (1.575 m)  Weight: 114 lb (51.71 kg)    GEN- The patient is well appearing, alert and oriented x 3 today.  Head- normocephalic, atraumatic Eyes-  Sclera clear, conjunctiva pink Ears- hearing intact Oropharynx- clear Neck- supple, no JVP Lymph- no cervical lymphadenopathy Lungs- Clear to ausculation bilaterally, normal work of breathing Heart- Irregular rate and rhythm, no murmurs, rubs or gallops, PMI not laterally displaced GI- soft, NT, ND, + BS Extremities- no clubbing, cyanosis, or edema MS- no significant deformity or atrophy Skin- no rash or lesion Psych- euthymic mood, full affect Neuro- strength and sensation are intact  EKG-aflutter with v rate 118 bpm, qrs int 86 ms, qtc 238 ms Epic records reviewed Labs  CMP-  5/12- creat 0.94, K-4.6, CBC-H/H 13.9/41.5  Assessment and Plan: 1. Symptomatic PAF Spoke to her regular cardiologist, Dr. Aundra Dubin and will increase amiodarone to 200 mg bid  She will return to clinic on Thursday and if still in afib will scheduled for cardioversion with Dr. Aundra Dubin She states no missed doses of xarelto  2. Chronic H/A's Per recommendation of PCP  F/u on Thursday  Corleen [place] C. Keegan Bensch, [facility] [address] [place], [place] [address] [phone]

## 2015-07-01 NOTE — Telephone Encounter (Signed)
I spoke with [name], nurse at [facility].  [name] states pt called her to her room this morning complaining of a headache. Bernadette states pt's BP 128/72, her heart rate is 123. [name] states pt does have a low grade fever. [name] states pt denies other symptoms including lightheadedness or dizziness. I reviewed with Dr Moshe Salisbury should be scheduled for further evaluation in [facility].  Pt scheduled in [facility] today at 1:30PM, Bernadette at [facility] aware of appt location and time.

## 2015-07-01 NOTE — Patient Instructions (Signed)
Your physician has recommended you make the following change in your medication:  1)Increase Amiodarone to 200mg  twice a day

## 2015-07-01 NOTE — Telephone Encounter (Signed)
New message     Patient complained to the nurse of headache and weakness.  The nurse checked her bp--128/72 and found her heart rate is 123.  Please advise

## 2015-07-03 ENCOUNTER — Other Ambulatory Visit: Payer: Self-pay

## 2015-07-03 ENCOUNTER — Encounter (HOSPITAL_COMMUNITY): Payer: Self-pay | Admitting: Nurse Practitioner

## 2015-07-03 ENCOUNTER — Other Ambulatory Visit: Payer: Self-pay | Admitting: Internal Medicine

## 2015-07-03 ENCOUNTER — Ambulatory Visit (HOSPITAL_COMMUNITY)
Admission: RE | Admit: 2015-07-03 | Discharge: 2015-07-03 | Disposition: A | Discharge status: Home / Self Care | Payer: Medicare Other | Source: Ambulatory Visit | Attending: Nurse Practitioner | Admitting: Nurse Practitioner

## 2015-07-03 VITALS — BP 118/60 | HR 66

## 2015-07-03 DIAGNOSIS — Z87891 Personal history of nicotine dependence: Secondary | ICD-10-CM | POA: Insufficient documentation

## 2015-07-03 DIAGNOSIS — R001 Bradycardia, unspecified: Secondary | ICD-10-CM | POA: Insufficient documentation

## 2015-07-03 DIAGNOSIS — R221 Localized swelling, mass and lump, neck: Secondary | ICD-10-CM | POA: Insufficient documentation

## 2015-07-03 DIAGNOSIS — I059 Rheumatic mitral valve disease, unspecified: Secondary | ICD-10-CM | POA: Insufficient documentation

## 2015-07-03 DIAGNOSIS — I5032 Chronic diastolic (congestive) heart failure: Secondary | ICD-10-CM | POA: Diagnosis not present

## 2015-07-03 DIAGNOSIS — H9192 Unspecified hearing loss, left ear: Secondary | ICD-10-CM | POA: Diagnosis not present

## 2015-07-03 DIAGNOSIS — I079 Rheumatic tricuspid valve disease, unspecified: Secondary | ICD-10-CM | POA: Insufficient documentation

## 2015-07-03 DIAGNOSIS — E7800 Pure hypercholesterolemia, unspecified: Secondary | ICD-10-CM | POA: Insufficient documentation

## 2015-07-03 DIAGNOSIS — E039 Hypothyroidism, unspecified: Secondary | ICD-10-CM | POA: Insufficient documentation

## 2015-07-03 DIAGNOSIS — H3530 Unspecified macular degeneration: Secondary | ICD-10-CM | POA: Diagnosis not present

## 2015-07-03 DIAGNOSIS — I480 Paroxysmal atrial fibrillation: Secondary | ICD-10-CM | POA: Diagnosis not present

## 2015-07-03 DIAGNOSIS — Z7901 Long term (current) use of anticoagulants: Secondary | ICD-10-CM | POA: Diagnosis not present

## 2015-07-03 DIAGNOSIS — I110 Hypertensive heart disease with heart failure: Secondary | ICD-10-CM | POA: Insufficient documentation

## 2015-07-03 DIAGNOSIS — Z9889 Other specified postprocedural states: Secondary | ICD-10-CM | POA: Insufficient documentation

## 2015-07-03 NOTE — Patient Instructions (Signed)
Continue amiodarone at 200 mg twice a day until Monday am then return to one half tablet a day.

## 2015-07-03 NOTE — Addendum Note (Signed)
Encounter addended by: Sherran Needs, NP on: 07/03/2015  4:35 PM<BR>     Documentation filed: Follow-up Section, LOS Section, Notes Section, Chief Complaint Section

## 2015-07-03 NOTE — Progress Notes (Signed)
Patient ID: Sarah Mays, female   DOB: December 17, 1930, 82 y.o.   MRN: [id number]      Primary Care Physician: Sarah Kinnier, DO Referring Physician: [facility] triage Cardiologist: Dr. Allie Mays is a 82 y.o. female with a h/o PAF, initially dx'ed 10/13, that has been staying in SR with 100 mg of amiodarone , h/o of bradycardia in SR and amiodarone was decreased to 100 mg in October 2016 for HR of 48.   She woke up this am with H/A and irregular heart beat, spoke to triage at [facility] and was referred to clinic. In the afib clinic, she is in a flutter with variable AV block at 118 bpm. She states no change in her usual health. H/A's are somewhat chonic in nature for which she takes tylenol, but if especially painful, she has been told to take aleve sparingly.States no missed xarelto in the past. Afib has been associated with triggering acute on chronic diastolic heart failure. So far no PND/orthopnea, fluid status appears stable. Weight is 114 and stable. Was seen in the ER 5/8 for H/A .  5/25-Amiodarone was increased to 200 mg bid and brought back to afib clinic today. She feels better and has returned to SR with PC's at 56 bpm.  Today, she denies symptoms of palpitations, chest pain, shortness of breath, orthopnea, PND, lower extremity edema, dizziness, presyncope, syncope, or neurologic sequela. The patient is tolerating medications without difficulties and is otherwise without complaint today.   Past Medical History  Diagnosis Date  . PAF (paroxysmal atrial fibrillation) (HCC)     a. on amio (02/2012 mild obstruction on PFT's) and xarelto.  . Essential hypertension   . Mitral valve disorders   . Tricuspid valve disorders, specified as nonrheumatic   . Neck mass     right, w/u including MRI negative  . Hypothyroidism   . Macular degeneration     Left, s/p inj. tx  . Senile osteoporosis     Reclast in the past  . Alopecia, unspecified   . Pure hypercholesterolemia   .  Chronic diastolic CHF (congestive heart failure) ([place])     a. 11/2013 Echo: EF 60-65%, no rwma, mild TR, PASP 4mmHg.  [name] Kitchen Headache(784.0)   . Backache, unspecified   . Polycythemia vera(238.4)   . Gouty arthropathy, unspecified   . Insomnia, unspecified   . Other malaise and fatigue   . Hyposmolality and/or hyponatremia   . Hyperpotassemia   . Edema   . Closed fracture of lower end of radius with ulna   . Hypercalcemia   . Deafness     Left, s/p multiple surgeries  . Abnormal stress test     a. 11/2011 Ex MV: EF 80%, small, partially reversible anteroapical defect->mild ischemia vs attenuation-->med Rx.   Past Surgical History  Procedure Laterality Date  . Breast biopsy  06/18/1998    left  . Total abdominal hysterectomy    . Tonsillectomy    . Breast biopsy    . Tympanoplasty Bilateral   . Ablation  08/28/14    Back     Current Outpatient Prescriptions  Medication Sig Dispense Refill  . acetaminophen (TYLENOL) 650 MG CR tablet Take 650 mg by mouth 3 (three) times daily.    [name] Kitchen amiodarone (PACERONE) 200 MG tablet Take 1 tablet (200 mg total) by mouth 2 (two) times daily.    . cholecalciferol (VITAMIN D) 1000 UNITS tablet Take 2,000 Units by mouth daily.     [name] Kitchen  fentaNYL (DURAGESIC - DOSED MCG/HR) 25 MCG/HR patch Place 1 patch (25 mcg total) onto the skin every 3 (three) days. 10 patch 0  . furosemide (LASIX) 40 MG tablet Take by mouth. 1.5 tablets (60mg ) two times a day    . gabapentin (NEURONTIN) 100 MG capsule Take 100 mg by mouth daily.    . hydrALAZINE (APRESOLINE) 50 MG tablet TAKE 1 1/2 TABLETS THREE TIMES DAILY. 135 tablet 2  . hydroxyurea (HYDREA) 500 MG capsule Take 500 mg by mouth daily. May take with food to minimize GI side effects.    [name] Kitchen levothyroxine (SYNTHROID, LEVOTHROID) 112 MCG tablet TAKE 1 TABLET EACH DAY. 30 tablet 2  . Magnesium 250 MG TABS Take 250 mg by mouth daily. Reported on 06/19/2015    . Melatonin 5 MG CAPS Take 5 mg by mouth at bedtime. Reported on  06/19/2015    . Multiple Vitamins-Minerals (PRESERVISION AREDS 2) CAPS Take 1 capsule by mouth 2 (two) times daily. Reported on 06/19/2015    . potassium chloride (K-DUR) 10 MEQ tablet Take 2 tablets (20 mEq total) by mouth daily. 60 tablet 10  . Rivaroxaban (XARELTO) 15 MG TABS tablet Take 15 mg by mouth daily with supper.    . senna-docusate (SENOKOT-S) 8.6-50 MG per tablet Take 2 tablets by mouth 2 (two) times daily. 120 tablet 3  . traMADol (ULTRAM) 50 MG tablet TAKE 1 OR 2 TABLETS EVERY 6 HOURS AS NEEDED FOR PAIN 60 tablet 0   No current facility-administered medications for this encounter.    Allergies  Allergen Reactions  . Amlodipine Swelling    Lower extremity edema     Social History   Social History  . Marital Status: Married    Spouse Name: Sarah Mays  . Number of Children: 2  . Years of Education: 16   Occupational History  . Not on file.   Social History Main Topics  . Smoking status: Former Smoker    Types: Cigarettes    Quit date: 01/19/1984  . Smokeless tobacco: Never Used  . Alcohol Use: 0.6 oz/week    1 Glasses of wine per week     Comment: 2 per day/ 14 a week  . Drug Use: No  . Sexual Activity: Not Currently   Other Topics Concern  . Not on file   Social History Narrative   Patient is Married 1955. 2 kids. 16 grandkid-71 years old in 2016. College graduate [employer]. [employer] [place]).    Lives in apartment,  Independent Living  section at [facility] since 02/2013.      Stay at home mother.       No Smoking history, Mod. alcohol use.   Patient has a living will, POA      Hobbies: CPU and painting             Family History  Problem Relation Age of Onset  . Hypertension Father   . Heart disease Father     patient does not know details.   . Breast cancer Daughter   . Cancer Daughter     breast  . Ovarian cancer Mother 62  . Cancer Sister     colon  . Heart disease Sister   . Cancer Sister   . Cancer Sister     hodgkin disease      ROS- All systems are reviewed and negative except as per the HPI above  Physical Exam: Filed Vitals:   07/03/15 1518  BP: 118/60  Pulse: 66  GEN- The patient is well appearing, alert and oriented x 3 today.   Head- normocephalic, atraumatic Eyes-  Sclera clear, conjunctiva pink Ears- hearing intact Oropharynx- clear Neck- supple, no JVP Lymph- no cervical lymphadenopathy Lungs- Clear to ausculation bilaterally, normal work of breathing Heart- Irregular rhythm,(due to PC)'s, no murmurs, rubs or gallops, PMI not laterally displaced GI- soft, NT, ND, + BS Extremities- no clubbing, cyanosis, or edema MS- no significant deformity or atrophy Skin- no rash or lesion Psych- euthymic mood, full affect Neuro- strength and sensation are intact  EKG-SR with PC's, confirmed with Dr. Aundra Dubin, qrs int 102 ms, qt int 408 ms Epic records reviewed Labs  CMP-  5/12- creat 0.94, K-4.6, CBC-H/H 13.9/41.5  Assessment and Plan: 1. Symptomatic PAF Has returned to SR with increase of amiodarone Will leave at 200 mg bid , but return to 1/2 tab a day on Monday Continue xarelto  2. Chronic H/A's Improved Per recommendation of PCP  F/u with Dr. Aundra Dubin in next 1-3 months Afib clinic as needed  Butch Penny C. Fatmata Legere, [facility] [address] [place],  [address] [phone]

## 2015-07-08 ENCOUNTER — Ambulatory Visit: Payer: Medicare Other | Admitting: Family Medicine

## 2015-07-08 ENCOUNTER — Telehealth: Payer: Self-pay | Admitting: Cardiology

## 2015-07-08 NOTE — Telephone Encounter (Signed)
Dr Nance Pew did not include that the nurse said her apical pulse was 68 and irregular, she just decreased amiodarone to 200mg  daily from 200mg  bid yesterday.

## 2015-07-08 NOTE — Telephone Encounter (Signed)
Pt states today BP 106/64, apical heart rate 68 by nurse. Pt states she is lightheaded but denies other symptoms. Pt states her SBP the end of last  week was in the 120 range. Pt states she decreased amiodarone from 200mg  bid to 200mg  daily yesterday as instructed at office visit last week.  Pt advised I will forward to Dr Aundra Dubin for review.

## 2015-07-08 NOTE — Telephone Encounter (Signed)
She can cut back on hydralazine from 75 tid to 50 tid.

## 2015-07-08 NOTE — Telephone Encounter (Signed)
New Message  Pt requested to speak w/ RN about her BP/HR  Pt c/o BP issue: STAT if pt c/o blurred vision, one-sided weakness or slurred speech  1. What are your last 5 BP readings? TODay- 5/30 BP- 106/64 p 68 (being irregular per pt)  2. Are you having any other symptoms (ex. Dizziness, headache, blurred vision, passed out)? n/a  3. What is your BP issue? BP low

## 2015-07-08 NOTE — Telephone Encounter (Signed)
Pt advised to decrease hydralazine to 50mg  tid from 75mg  tid.

## 2015-07-08 NOTE — Telephone Encounter (Signed)
May be in afib again, would have her get ECG if irregular HR continues to tomorrow.

## 2015-07-08 NOTE — Telephone Encounter (Signed)
Pt advised to take amiodarone 200mg  tonight and 200mg  in the AM, get BP and HR check in the AM around 10:30AM. Pt states she took amiodarone  1/2 of a 200mg  (100mg ) tablet yesterday and today not 1 whole 200mg  tablet.  Pt advised to call with HR and BP readings after they are checked in the AM. Pt verbalized understanding.

## 2015-07-08 NOTE — Telephone Encounter (Signed)
Dr Cherie Ouch she continue amiodarone 200mg  daily? She just decreased from 200mg  bid yesterday.

## 2015-07-08 NOTE — Telephone Encounter (Signed)
Follow-up   The pt wants to be certain if it ok or not to take the medication

## 2015-07-08 NOTE — Telephone Encounter (Signed)
See other phone note dated 07/08/15

## 2015-07-08 NOTE — Telephone Encounter (Signed)
Can go back to 200 bid x 5 days then back to 200 daily.

## 2015-07-08 NOTE — Telephone Encounter (Signed)
New message      Pt c/o BP issue: STAT if pt c/o blurred vision, one-sided weakness or slurred speech  1. What are your last 5 BP readings? This am 106[facility]64 [facility] irregular  2. Are you having any other symptoms (ex. Dizziness, headache, blurred vision, passed out)? Pt denied any symptoms  3. What is your BP issue? The pt wants to make sure it is ok to take the pill after lunch today   Pt c/o medication issue:  1. Name of Medication: hydralazine  2. How are you currently taking this medication (dosage and times per day)? 50 mg po daily  3. Are you having a reaction (difficulty breathing--STAT)? no  4. What is your medication issue? The pt is taking 1 and 1/2 pill is it ok to take the medication after lunch time. Please call the pt back at number provided.

## 2015-07-09 ENCOUNTER — Telehealth: Payer: Self-pay | Admitting: Cardiology

## 2015-07-09 DIAGNOSIS — H353221 Exudative age-related macular degeneration, left eye, with active choroidal neovascularization: Secondary | ICD-10-CM | POA: Diagnosis not present

## 2015-07-09 NOTE — Telephone Encounter (Signed)
LMTCB --see phone note 07/08/15

## 2015-07-09 NOTE — Telephone Encounter (Signed)
I left a detailed message on pt's voice mail with Dr Claris Gladden recommendations Pt advised in message  to call the office tomorrow if questions.

## 2015-07-09 NOTE — Telephone Encounter (Signed)
Pt states she is returning call to [facility] to report BP readings -pls call [phone]

## 2015-07-09 NOTE — Telephone Encounter (Signed)
I spoke with patient and she is aware of Dr Claris Gladden recommendations.

## 2015-07-09 NOTE — Telephone Encounter (Signed)
F/u  Pt states she spoke with RN this morning 5/31. Pt requested a call from the Rn to  discuss her taking the correct dosage of her amiodarone and hydralazine. Please call back to discuss with pt

## 2015-07-09 NOTE — Telephone Encounter (Signed)
Pt calling to report BP of 152/70, HR 71 apical with only 1 irregular beat today. Pt states her hydralazine was decreased to 50mg  tid from 75mg  tid yesterday because of low BP. Pt states she took amiodarone 200mg  bid yesterday and amiodarone 200mg  this morning. Pt states she had taken amiodarone 200mg  bid until Monday morning 07/07/15 when she decreased amiodarone to 100mg  daily per Kerby Less instructions. Pt states she does have a headache and has taken tylenol. Pt does have an appt with Dr Aundra Dubin 07/18/15.   Pt advised I will forward to Dr Aundra Dubin for review and recommendations about follow up and dosage of hydralazine and amiodarone.

## 2015-07-09 NOTE — Telephone Encounter (Signed)
Keep amiodarone 200 mg daily.  Keep hydralazine 50 mg tid but record BPs. Keep followup on 6/9.

## 2015-07-11 ENCOUNTER — Other Ambulatory Visit: Payer: Self-pay | Admitting: *Deleted

## 2015-07-11 ENCOUNTER — Telehealth: Payer: Self-pay | Admitting: Cardiology

## 2015-07-11 DIAGNOSIS — M5440 Lumbago with sciatica, unspecified side: Secondary | ICD-10-CM

## 2015-07-11 MED ORDER — FENTANYL 25 MCG/HR TD PT72: 25.0000 ug | MEDICATED_PATCH | TRANSDERMAL | Status: DC

## 2015-07-11 NOTE — Telephone Encounter (Signed)
Rx has been signed and given to [place] to deliver to patient at [facility]

## 2015-07-11 NOTE — Telephone Encounter (Signed)
Patient requested. Give to [facility] to take to [facility] for patient to pick up

## 2015-07-11 NOTE — Telephone Encounter (Signed)
Pt states she has a little lightheadedness but this is unchanged.  I reviewed with Dr Yancey Flemings new recommendations, keep appt already scheduled for June 9,2017.  Pt advised, verbalized understanding.

## 2015-07-11 NOTE — Telephone Encounter (Signed)
New message    Pt c/o BP issue: STAT if pt c/o blurred vision, one-sided weakness or slurred speech  1. What are your last 5 BP readings? This am  126/68 pulse  59 - irregular   2. Are you having any other symptoms (ex. Dizziness, headache, blurred vision, passed out)? No   3. What is your BP issue? Clinical nurse at [facility] advise to call the office

## 2015-07-16 ENCOUNTER — Telehealth: Payer: Self-pay | Admitting: *Deleted

## 2015-07-16 NOTE — Telephone Encounter (Signed)
Pt called because she has been feeling dizzy lately and wonders if her Polycythemia needs to be "checked?"  Reviewed recent CBC w/ pt that was done by her PCP 3 weeks ago.  Informed her it looks stable and should not be causing dizziness.  Pt has history of A-fib and sees her Cardiologist later this week.  Encouraged pt to discuss the dizziness w/ her Cardiologist as it could be due to the Afib or medications.  Pt agreed and will call us back if any further questions or concerns.  She continues to take Hydrea as prescribed and scheduled to see Dr. Alvy Bimler again in September.

## 2015-07-16 NOTE — Telephone Encounter (Signed)
agree

## 2015-07-17 ENCOUNTER — Telehealth: Payer: Self-pay | Admitting: Cardiology

## 2015-07-17 NOTE — Telephone Encounter (Signed)
Called patient per message received from [place]... Patient has MD OV w/Dr. Aundra Dubin 6/9 Patient c/o slight headache Patient states she had been "wiped out" - no energy - for last few weeks (this is new) Patient reports occasional lightheadedness/dizziness - no feelings of pre-syncope Patient denies chest pain, occasional shortness of breath (in no distress while on phone)  On 6/2 - patient was advised to call our office by [facility] nurse and the complaints were similar  Advised patient to continue medications and keep MD OV for 2:15pm tomorrow afternoon.  Routed to Dr. Aundra Dubin as Juluis Rainier.

## 2015-07-17 NOTE — Telephone Encounter (Signed)
[facility] nurse at [facility] nurse calling to report low heart rate: 51 irregualr. BP 140/62 and the resident took the amiodarone (PACERONE) 200 MG tablet and the hydrALAZINE (APRESOLINE) 50 MG tablet prior to vital sign check. Please call resident back to discuss how to move forward. Pt c/o of being dizzy on 07/16/2015 before the vitals were taken so the nurse decided to report this.

## 2015-07-18 ENCOUNTER — Ambulatory Visit (INDEPENDENT_AMBULATORY_CARE_PROVIDER_SITE_OTHER): Payer: Medicare Other | Admitting: Cardiology

## 2015-07-18 ENCOUNTER — Encounter: Payer: Self-pay | Admitting: *Deleted

## 2015-07-18 ENCOUNTER — Encounter: Payer: Self-pay | Admitting: Cardiology

## 2015-07-18 VITALS — BP 114/78 | HR 70 | Ht 60.0 in | Wt 117.0 lb

## 2015-07-18 DIAGNOSIS — R0602 Shortness of breath: Secondary | ICD-10-CM

## 2015-07-18 DIAGNOSIS — I5032 Chronic diastolic (congestive) heart failure: Secondary | ICD-10-CM | POA: Diagnosis not present

## 2015-07-18 DIAGNOSIS — I4891 Unspecified atrial fibrillation: Secondary | ICD-10-CM | POA: Diagnosis not present

## 2015-07-18 DIAGNOSIS — I480 Paroxysmal atrial fibrillation: Secondary | ICD-10-CM

## 2015-07-18 LAB — TSH: TSH: 2.21 mIU/L

## 2015-07-18 MED ORDER — FUROSEMIDE 40 MG PO TABS: ORAL_TABLET | ORAL | Status: DC

## 2015-07-18 NOTE — Patient Instructions (Signed)
Medication Instructions:  Increase lasix to 80mg  in the AM and 60mg  in the PM.  This will be 2 of your 40mg  tablets in the AM and 1.5 of your 40mg  tablets in the PM.  Labwork:  BNP/TSH today.  Your physician recommends that you return for lab work in: 2 weeks--BMET.   Testing/Procedures: Your physician has requested that you have an echocardiogram. Echocardiography is a painless test that uses sound waves to create images of your heart. It provides your doctor with information about the size and shape of your heart and how well your heart's chambers and valves are working. This procedure takes approximately one hour. There are no restrictions for this procedure.    Follow-Up: Your physician recommends that you schedule a follow-up appointment in: 6 weeks with Dr Aundra Dubin.        If you need a refill on your cardiac medications before your next appointment, please call your pharmacy.

## 2015-07-19 LAB — BRAIN NATRIURETIC PEPTIDE: BRAIN NATRIURETIC PEPTIDE: 401.4 pg/mL — AB (ref <100)

## 2015-07-20 NOTE — Progress Notes (Signed)
Patient ID: Sarah Mays, female   DOB: 05-30-1930, 82 y.o.   MRN: [id number] PCP: Dr Hollace Kinnier  82 yo with paroxysmal atrial fibrillation and chronic diastolic CHF returns for cardiology evaluation.  She is on amiodarone to try to maintain NSR.  She is tolerating Xarelto without melena or BRBPR.  In 7/16, she was admitted with chest pain.  Cardiolite showed no ischemia or infarction.  Echo in 7/16 showed EF 60-65%.    In 5/17, she was found to be in atrial flutter.  At a followup appt at the atrial fibrillation clinic, she was back in NSR.  She is in NSR today.  She does not feels palpitations.  She is generally more fatigued and dizzy at times, has had on and off headaches.  BP is high today but brings in home readings, SBP generally < 140.  She is short of breath with inclines, ok on flat ground.  No chest pain.     Labs (7/13): K 4.7, creatinine 0.56 Labs (8/13): K 5.4, creatinine 0.69 Labs (11/13): TSH 6.45, K 5.4=>3.8, creatinine 1.5=>1.2, BUN 70=>35, HCT 33.2, BNP 866, AST 25, ALT 36 Labs (12/13): K 3.5 => 2.8, creatinine 1.2 => 1.33, BUN 56 Labs (1/14): LDL 85, HDL 86, K 3.9, creatinine 1.0, BNP 447 Labs (4/14): K 4.1, creatinine 0.9, BNP 293, LFTs normal, TSH normal Labs (8/14): K 4.3, creatinine 1.2, LFTs normal, TSH normal Labs (12/14): LFTs normal, TSH normal Labs (3/15): K 4.5, creatinine 1.3, HCT 36.2 Labs (10/15): K 5.1, creatinine 0.9, LFTs normal, TSH normal, pBNP 1893 Labs (3/16): K 3.9, creatinine 1.03, HCT 36.9, LFTs normal Labs (7/16): K 4, creatinine 0.94, calcium 10.8, LFTs normal Labs (9/16): K 4, creatinine 0.9, LDL 69, TSH normal, HCT 42.3 Labs (5/17): K 4.6, creatinine 0.94, HCT 41.5, LFTs normal.  ECG: NSR, 1st degree AVB, iRBBB  PMH: 1. Atrial fibrillation: Diagnosed initially in 10/13.  Holter monitor in 11/13 showed atrial fibrillation with average rate 65. Back in NSR on amiodarone.   2. Chronic diastolic CHF: Echo ([id number]) with EF 65-70%, mild MR,  moderate biatrial enlargement, moderate TR, PA systolic pressure 45 mmHg.   Echo (10/15): EF 60-65%.  Echo (7/16) with EF 60-65%, mild MR.  3. ETT-Sestamibi (11/13): Exercised to stage II, small partially reversible anteroapical perfusion defect suggesting mild ischemia versus attenuation, EF 80%.  Cardiolite (7/16) with EF 64%, no ischemia or infarction.  4. HTN: Lower extremity swelling with amlodipine.  5. Polycythemia vera: Has had phlebotomy only once.  6. Hypothyroidism 7. Macular degeneration 8. TAH 1980 9. Familial hypocalciuric hypercalcemia 10. PFTs (amiodarone use) in 1/14 showed a mild obstructive defect.  11. Degenerative disc disease  SH: Prior smoker (years ago).  Married, lived in [place] but now at [facility]  Occasional ETOH.  Daughter works for [employer].   FH: CAD  ROS: All systems reviewed and negative except as per HPI.   Current Outpatient Prescriptions  Medication Sig Dispense Refill  . acetaminophen (TYLENOL) 650 MG CR tablet Take 650 mg by mouth 3 (three) times daily.    [name] Kitchen amiodarone (PACERONE) 200 MG tablet Take 1 tablet (200 mg total) by mouth daily.    . cholecalciferol (VITAMIN D) 1000 UNITS tablet Take 2,000 Units by mouth daily.     . fentaNYL (DURAGESIC - DOSED MCG/HR) 25 MCG/HR patch Place 1 patch (25 mcg total) onto the skin every 3 (three) days. 10 patch 0  . gabapentin (NEURONTIN) 100 MG capsule Take 100 mg by mouth daily.    [name] Kitchen  hydrALAZINE (APRESOLINE) 50 MG tablet Take 1 tablet (50 mg total) by mouth 3 (three) times daily.    . hydroxyurea (HYDREA) 500 MG capsule Take 500 mg by mouth daily. May take with food to minimize GI side effects.    [name] Kitchen levothyroxine (SYNTHROID, LEVOTHROID) 112 MCG tablet TAKE 1 TABLET EACH DAY. 30 tablet 2  . Magnesium 250 MG TABS Take 250 mg by mouth daily. Reported on 06/19/2015    . Melatonin 5 MG CAPS Take 5 mg by mouth at bedtime. Reported on 06/19/2015    . Multiple Vitamins-Minerals (PRESERVISION AREDS 2) CAPS Take 1  capsule by mouth 2 (two) times daily. Reported on 06/19/2015    . potassium chloride (K-DUR) 10 MEQ tablet Take 2 tablets (20 mEq total) by mouth daily. 60 tablet 10  . Rivaroxaban (XARELTO) 15 MG TABS tablet Take 15 mg by mouth daily with supper.    . senna-docusate (SENOKOT-S) 8.6-50 MG per tablet Take 2 tablets by mouth 2 (two) times daily. 120 tablet 3  . traMADol (ULTRAM) 50 MG tablet TAKE 1 OR 2 TABLETS EVERY 6 HOURS AS NEEDED FOR PAIN 60 tablet 0  . furosemide (LASIX) 40 MG tablet Take 2 tablets (80mg ) by mouth in the AM and 1.5 tablets (60mg ) by mouth in the PM 315 tablet 1   No current facility-administered medications for this visit.    BP 114/78 mmHg  Pulse 70  Ht 5' (1.524 m)  Wt 117 lb (53.071 kg)  BMI 22.85 kg/m2 General: NAD Neck: JVP 8-9 cm, no thyromegaly or thyroid nodule.  Lungs: Slight crackles at bases bilaterally.  CV: Nondisplaced PMI.  Heart regular S1/S2, no S3/S4, 1/6 early SEM.  No edema.  No carotid bruit.  Abdomen: Soft, nontender, no hepatosplenomegaly, no distention.  Neurologic: Alert and oriented x 3.  Psych: Normal affect. Extremities: No clubbing or cyanosis.   Assessment/Plan:  1. Atrial fibrillation: First noted in 10/13.  Atrial fibrillation has triggered acute on chronic diastolic CHF in the past.  CHADSVASC score is 70 (age, female gender, HTN, CHF).  She is anticoagulated with Xarelto 15 mg daily which is appropriate for her creatinine clearance.  She is off nebivolol due to bradycardia.  She remains in NSR today.  - Continue amiodarone 200 mg daily, increased from 100 mg daily given recent breakthrough atrial flutter.  Recent LFTs normal.  Check TSH today.  Given amiodarone use, she will need yearly eye exams.  Baseline PFTs showed a mild obstructive defect.   2. CAD: No further chest pain.  7/16 Cardiolite showed no ischemia or infarction.  3. Chronic diastolic CHF:  NYHA class II symptoms. Volume looks ok on current Lasix though she reports  increased fatigue.   -Continue Lasix 80 qam/60 pm.  - I will get an echo to make sure that LV EF remains normal.  - Check BNP today.   4. HTN: BP reasonably controlled on current regimen.   5. Bradycardia: HR in 70s today on amiodarone 200 mg daily.   6. Hypercalcemia: Mild.  Has diagnosis of familial hypocalciuric hypercalcemia.   Followup 6 wks.   Loralie Champagne 07/20/2015

## 2015-07-26 ENCOUNTER — Other Ambulatory Visit: Payer: Self-pay | Admitting: Hematology and Oncology

## 2015-07-29 DIAGNOSIS — E039 Hypothyroidism, unspecified: Secondary | ICD-10-CM | POA: Diagnosis not present

## 2015-07-29 DIAGNOSIS — E7800 Pure hypercholesterolemia, unspecified: Secondary | ICD-10-CM | POA: Diagnosis not present

## 2015-07-29 LAB — LIPID PANEL
Cholesterol: 162 mg/dL (ref 0–200)
HDL: 101 mg/dL — AB (ref 35–70)
LDL Cholesterol: 42 mg/dL
Triglycerides: 100 mg/dL (ref 40–160)

## 2015-07-30 ENCOUNTER — Encounter: Payer: Self-pay | Admitting: Internal Medicine

## 2015-08-05 ENCOUNTER — Other Ambulatory Visit: Payer: Self-pay

## 2015-08-05 ENCOUNTER — Other Ambulatory Visit (INDEPENDENT_AMBULATORY_CARE_PROVIDER_SITE_OTHER): Payer: Medicare Other

## 2015-08-05 ENCOUNTER — Ambulatory Visit (HOSPITAL_COMMUNITY): Payer: Medicare Other | Attending: Cardiology

## 2015-08-05 DIAGNOSIS — I2510 Atherosclerotic heart disease of native coronary artery without angina pectoris: Secondary | ICD-10-CM | POA: Insufficient documentation

## 2015-08-05 DIAGNOSIS — I313 Pericardial effusion (noninflammatory): Secondary | ICD-10-CM | POA: Insufficient documentation

## 2015-08-05 DIAGNOSIS — I340 Nonrheumatic mitral (valve) insufficiency: Secondary | ICD-10-CM | POA: Insufficient documentation

## 2015-08-05 DIAGNOSIS — I77810 Thoracic aortic ectasia: Secondary | ICD-10-CM | POA: Insufficient documentation

## 2015-08-05 DIAGNOSIS — R0602 Shortness of breath: Secondary | ICD-10-CM

## 2015-08-05 DIAGNOSIS — I5032 Chronic diastolic (congestive) heart failure: Secondary | ICD-10-CM | POA: Diagnosis not present

## 2015-08-05 DIAGNOSIS — I110 Hypertensive heart disease with heart failure: Secondary | ICD-10-CM | POA: Diagnosis not present

## 2015-08-05 DIAGNOSIS — I495 Sick sinus syndrome: Secondary | ICD-10-CM | POA: Insufficient documentation

## 2015-08-05 DIAGNOSIS — I480 Paroxysmal atrial fibrillation: Secondary | ICD-10-CM

## 2015-08-05 DIAGNOSIS — Z87891 Personal history of nicotine dependence: Secondary | ICD-10-CM | POA: Insufficient documentation

## 2015-08-05 LAB — ECHOCARDIOGRAM COMPLETE
Ao-asc: 38 cm
CHL CUP DOP CALC LVOT VTI: 28.4 cm
CHL CUP MV DEC (S): 176
CHL CUP RV SYS PRESS: 41 mmHg
CHL CUP TV REG PEAK VELOCITY: 288 cm/s
EERAT: 11.06
EWDT: 176 ms
FS: 33 % (ref 28–44)
IV/PV OW: 0.87
LA diam end sys: 45 mm
LA vol A4C: 69.8 ml
LADIAMINDEX: 2.99 cm/m2
LASIZE: 45 mm
LV PW d: 9.19 mm — AB (ref 0.6–1.1)
LV e' LATERAL: 10.4 cm/s
LVEEAVG: 11.06
LVEEMED: 11.06
LVOT SV: 89 mL
LVOT area: 3.14 cm2
LVOT diameter: 20 mm
LVOT peak grad rest: 5 mmHg
LVOT peak vel: 115 cm/s
Lateral S' vel: 10.1 cm/s
MV Peak grad: 5 mmHg
MVPKAVEL: 90.7 m/s
MVPKEVEL: 115 m/s
TAPSE: 23 mm
TDI e' lateral: 10.4
TDI e' medial: 6.68
TRMAXVEL: 288 cm/s

## 2015-08-05 LAB — BASIC METABOLIC PANEL
BUN: 25 mg/dL (ref 7–25)
CHLORIDE: 97 mmol/L — AB (ref 98–110)
CO2: 29 mmol/L (ref 20–31)
CREATININE: 1.04 mg/dL — AB (ref 0.60–0.88)
Calcium: 11.1 mg/dL — ABNORMAL HIGH (ref 8.6–10.4)
Glucose, Bld: 70 mg/dL (ref 65–99)
Potassium: 5 mmol/L (ref 3.5–5.3)
Sodium: 137 mmol/L (ref 135–146)

## 2015-08-06 ENCOUNTER — Non-Acute Institutional Stay: Payer: Medicare Other | Admitting: Internal Medicine

## 2015-08-06 ENCOUNTER — Encounter: Payer: Self-pay | Admitting: Internal Medicine

## 2015-08-06 VITALS — BP 132/68 | HR 56 | Temp 98.2°F | Ht 60.0 in | Wt 116.0 lb

## 2015-08-06 DIAGNOSIS — I10 Essential (primary) hypertension: Secondary | ICD-10-CM | POA: Diagnosis not present

## 2015-08-06 DIAGNOSIS — M5440 Lumbago with sciatica, unspecified side: Secondary | ICD-10-CM

## 2015-08-06 DIAGNOSIS — Z0000 Encounter for general adult medical examination without abnormal findings: Secondary | ICD-10-CM

## 2015-08-06 DIAGNOSIS — M810 Age-related osteoporosis without current pathological fracture: Secondary | ICD-10-CM | POA: Diagnosis not present

## 2015-08-06 DIAGNOSIS — I480 Paroxysmal atrial fibrillation: Secondary | ICD-10-CM | POA: Diagnosis not present

## 2015-08-06 DIAGNOSIS — I5032 Chronic diastolic (congestive) heart failure: Secondary | ICD-10-CM

## 2015-08-06 DIAGNOSIS — K5903 Drug induced constipation: Secondary | ICD-10-CM | POA: Diagnosis not present

## 2015-08-06 MED ORDER — FENTANYL 25 MCG/HR TD PT72: 25.0000 ug | MEDICATED_PATCH | TRANSDERMAL | Status: DC

## 2015-08-06 NOTE — Progress Notes (Signed)
Location:   [place] of Service:  Clinic (12) Provider: Nazaiah Navarrete L. Mariea Clonts, D.O., C.M.D.  Patient Care Team: Gayland Curry, DO as PCP - General (Geriatric Medicine) [facility] Hurman Horn, MD as Consulting Physician (Ophthalmology) Larey Dresser, MD as Consulting Physician (Cardiology) Heath Lark, MD as Consulting Physician (Hematology and Oncology) Clent Jacks, MD as Consulting Physician (Ophthalmology) Jerrell Belfast, MD as Consulting Physician (Otolaryngology)  Extended Emergency Contact Information Primary Emergency Contact: Thielman,[name] Address: [address]          [place] [address] Johnnette Litter of [place] Phone: [phone] Relation: Spouse Secondary Emergency Contact: [name] [place] of [place] Phone: [phone] Mobile Phone: [phone] Relation: Daughter  Code Status: DNR Goals of Care: Advanced Directive information Advanced Directives 08/06/2015  Does patient have an advance directive? Yes  Type of Paramedic of [place];Living will  Copy of advanced directive(s) in chart? Yes    Chief Complaint  Patient presents with  . Annual Exam    wellness exam  . MMSE    29/30 passed clock    HPI: Patient is a 82 y.o. female seen in today for an annual wellness exam.    Depression screen [facility] 2/9 08/06/2015 06/25/2015 06/19/2015 07/19/2014 04/04/2014  Decreased Interest 0 0 0 0 0  Down, Depressed, Hopeless 0 0 0 0 0  PHQ - 2 Score 0 0 0 0 0    Fall Risk  08/06/2015 06/25/2015 06/19/2015 05/07/2015 09/11/2014  Falls in the past year? No No No No No  Risk for fall due to : - - - - -   MMSE - Mini Mental State Exam 08/06/2015  Orientation to time 4  Orientation to Place 5  Registration 3  Attention/ Calculation 5  Recall 3  Language- name 2 objects 2  Language- repeat 1  Language- follow 3 step command 3  Language- read & follow direction 1  Write a sentence 1  Copy design 1   Total score 29  passed clock   Health Maintenance  Topic Date Due  . TETANUS/TDAP  08/03/1949  . INFLUENZA VACCINE  09/09/2015  . DEXA SCAN  Completed  . ZOSTAVAX  Completed  . PNA vac Low Risk Adult  Completed    Urinary incontinence?  No problem Functional Status Survey: Is the patient deaf or have difficulty hearing?: No Does the patient have difficulty seeing, even when wearing glasses/contacts?: No Does the patient have difficulty concentrating, remembering, or making decisions?: No Does the patient have difficulty walking or climbing stairs?: Yes Does the patient have difficulty dressing or bathing?: No Does the patient have difficulty doing errands alone such as visiting a doctor's office or shopping?: No  Current Exercise Habits: The patient does not participate in regular exercise at present Exercise limited by: orthopedic condition(s) Diet?  No special diet Vision Screening Comments: Retina dr appt 08/07/2015 Dr. Zadie Rhine Hearing:  Never great.  Almost deaf in right ear.  Does need to go to her ENT for wax removal.   Dentition:  No problems Pain:  Back had recently been doing well.  Had her echocardiogram yesterday and sat right up instead of pulling herself up by getting on her side first.    Past Medical History  Diagnosis Date  . PAF (paroxysmal atrial fibrillation) (HCC)     a. on amio (02/2012 mild obstruction on PFT's) and xarelto.  . Essential hypertension   . Mitral valve disorders   .  Tricuspid valve disorders, specified as nonrheumatic   . Neck mass     right, w/u including MRI negative  . Hypothyroidism   . Macular degeneration     Left, s/p inj. tx  . Senile osteoporosis     Reclast in the past  . Alopecia, unspecified   . Pure hypercholesterolemia   . Chronic diastolic CHF (congestive heart failure) ([place])     a. 11/2013 Echo: EF 60-65%, no rwma, mild TR, PASP 4mHg.  .[name] KitchenHeadache(784.0)   . Backache, unspecified   . Polycythemia vera(238.4)   .  Gouty arthropathy, unspecified   . Insomnia, unspecified   . Other malaise and fatigue   . Hyposmolality and/or hyponatremia   . Hyperpotassemia   . Edema   . Closed fracture of lower end of radius with ulna   . Hypercalcemia   . Deafness     Left, s/p multiple surgeries  . Abnormal stress test     a. 11/2011 Ex MV: EF 80%, small, partially reversible anteroapical defect->mild ischemia vs attenuation-->med Rx.    Past Surgical History  Procedure Laterality Date  . Breast biopsy  06/18/1998    left  . Total abdominal hysterectomy    . Tonsillectomy    . Breast biopsy    . Tympanoplasty Bilateral   . Ablation  08/28/14    Back     The patient has a family history of  shoulder Social History   Social History  . Marital Status: Married    Spouse Name: JJenny Reichmann . Number of Children: 2  . Years of Education: 16   Occupational History  . Not on file.   Social History Main Topics  . Smoking status: Former Smoker    Types: Cigarettes    Quit date: 01/19/1984  . Smokeless tobacco: Never Used  . Alcohol Use: 0.6 oz/week    1 Glasses of wine per week     Comment: 2 per day/ 14 a week  . Drug Use: No  . Sexual Activity: Not Currently   Other Topics Concern  . Not on file   Social History Narrative   Patient is Married 1955. 2 kids. 156grandkid-257years old in 2016. College graduate ([name].    Lives in apartment,  Independent Living  [facility] 02/[facility].      Stay at home mother.       No Smoking history, Mod. alcohol use.   Patient has a living will, POA      Hobbies: CPU and painting             Allergies  Allergen Reactions  . Amlodipine Swelling    Lower extremity edema       Medication List       This list is accurate as of: 08/06/15  3:02 PM.  Always use your most recent med list.               acetaminophen 650 MG CR tablet  Commonly known as:  TYLENOL  Take 650 mg by mouth 3 (three) times daily.      amiodarone 200 MG tablet  Commonly known as:  PACERONE  Take 1 tablet (200 mg total) by mouth daily.     cholecalciferol 1000 units tablet  Commonly known as:  VITAMIN D  Take 2,000 Units by mouth daily.     fentaNYL 25 MCG/HR patch  Commonly known as:  DURAGESIC - dosed mcg/hr  Place 1 patch (25  mcg total) onto the skin every 3 (three) days.     FIBER-LAX PO  Take by mouth daily.     furosemide 40 MG tablet  Commonly known as:  LASIX  Take 2 tablets (42m) by mouth in the AM and 1.5 tablets (660m by mouth in the PM     gabapentin 100 MG capsule  Commonly known as:  NEURONTIN  Take 100 mg by mouth daily.     hydrALAZINE 50 MG tablet  Commonly known as:  APRESOLINE  Take 1 tablet (50 mg total) by mouth 3 (three) times daily.     hydroxyurea 500 MG capsule  Commonly known as:  HYDREA  Take 500 mg by mouth daily. May take with food to minimize GI side effects.     levothyroxine 112 MCG tablet  Commonly known as:  SYNTHROID, LEVOTHROID  TAKE 1 TABLET EACH DAY.     Magnesium 250 MG Tabs  Take 250 mg by mouth daily. Reported on 06/19/2015     Melatonin 5 MG Caps  Take 5 mg by mouth at bedtime. Reported on 06/19/2015     potassium chloride 10 MEQ tablet  Commonly known as:  K-DUR  Take 2 tablets (20 mEq total) by mouth daily.     PRESERVISION AREDS 2 Caps  Take 1 capsule by mouth 2 (two) times daily. Reported on 06/19/2015     senna-docusate 8.6-50 MG tablet  Commonly known as:  Senokot-S  Take 2 tablets by mouth 2 (two) times daily.     traMADol 50 MG tablet  Commonly known as:  ULTRAM  TAKE 1 OR 2 TABLETS EVERY 6 HOURS AS NEEDED FOR PAIN     XARELTO 15 MG Tabs tablet  Generic drug:  Rivaroxaban  Take 15 mg by mouth daily with supper.        Review of Systems:  Review of Systems  Constitutional: Negative for fever, chills and malaise/fatigue.  HENT: Positive for hearing loss. Negative for congestion.   Eyes: Positive for blurred vision.       Left eye  Macular degeneration--getting injections b/c worse lately with bleeding thought to be due to constipation--was put on fiberlax from ophtho--worked great for 2 mos 1 bid  Respiratory: Negative for cough and shortness of breath.   Cardiovascular: Negative for chest pain and palpitations.  Gastrointestinal: Positive for constipation. Negative for nausea, vomiting, abdominal pain, diarrhea, blood in stool and melena.       Having increased flatus with constipation  Genitourinary: Negative for dysuria, urgency and frequency.  Musculoskeletal: Positive for back pain. Negative for falls.  Skin: Negative for itching and rash.  Neurological: Negative for dizziness, tingling, sensory change, loss of consciousness, weakness and headaches.  Endo/Heme/Allergies: Does not bruise/bleed easily.  Psychiatric/Behavioral: Negative for depression and memory loss. The patient does not have insomnia.     Physical Exam: Filed Vitals:   08/06/15 1440  BP: 132/68  Pulse: 56  Temp: 98.2 F (36.8 C)  TempSrc: Oral  Height: 5' (1.524 m)  Weight: 116 lb (52.617 kg)  SpO2: 96%   Body mass index is 22.65 kg/(m^2). Physical Exam  Constitutional: She is oriented to person, place, and time. She appears well-developed and well-nourished. No distress.  HENT:  Head: Normocephalic and atraumatic.  Right Ear: External ear normal.  Left Ear: External ear normal.  Nose: Nose normal.  Mouth/Throat: Oropharynx is clear and moist. No oropharyngeal exudate.  Some cerumen present, but only small amt and nonobstructive  Eyes: Conjunctivae and  EOM are normal. Pupils are equal, round, and reactive to light.  Neck: Normal range of motion. Neck supple. No JVD present. No tracheal deviation present.  Right lipoma beneath ear  Cardiovascular:  irreg irreg  Pulmonary/Chest: Effort normal and breath sounds normal.  Abdominal: Bowel sounds are normal. She exhibits distension. She exhibits no mass. There is no tenderness. There is  no rebound and no guarding.  No bm today or yesterday  Musculoskeletal:  Walks with right leg bowed, using rollator walker; back too sore to get up on exam table so exam done in regular chair as best we could  Lymphadenopathy:    She has no cervical adenopathy.  Neurological: She is alert and oriented to person, place, and time. No cranial nerve deficit.  Skin: Skin is warm and dry.  Psychiatric: She has a normal mood and affect.    Labs reviewed: Basic Metabolic Panel:  Recent Labs  08/15/14 1651  10/29/14 11/29/14 1518 06/20/15 0903 07/18/15 1506 08/05/15 1456  NA  --   < > 137 138 139  --  137  K  --   < > 4.0 3.8 4.6  --  5.0  CL  --   < >  --  99 98  --  97*  CO2  --   < >  --  26 25  --  29  GLUCOSE  --   < >  --  64* 79  --  70  BUN  --   < > 22* 26* 24  --  25  CREATININE  --   < > 0.9 0.87 0.94  --  1.04*  CALCIUM  --   < >  --  10.6* 11.3*  --  11.1*  TSH 1.14  --  0.72  --   --  2.21  --   < > = values in this interval not displayed. Liver Function Tests:  Recent Labs  09/02/14 0641 11/29/14 1518 06/20/15 0903  AST 20 22 17   ALT 17 17 11   ALKPHOS 52 61 72  BILITOT 0.9 0.7 0.7  PROT 5.9* 6.8 6.2  ALBUMIN 3.8 4.3 4.6   No results for input(s): LIPASE, AMYLASE in the last 8760 hours. No results for input(s): AMMONIA in the last 8760 hours. CBC:  Recent Labs  10/29/14 11/07/14 1247 05/09/15 1445 06/20/15 0903  WBC 8.3 8.3 9.5 10.8  NEUTROABS  --  5.7 6.5 8.0*  HGB 13.7 14.1 14.1  --   HCT 40 42.3 41.9 41.5  MCV  --  113.1* 114.2* 113*  PLT 384 382 498* 487*   Lipid Panel:  Recent Labs  10/29/14 07/29/15 1200  CHOL 164 162  HDL 78* 101*  LDLCALC 69 42  TRIG 133 100   No results found for: HGBA1C  Procedures: 06/16/15 CT head w/o contrast:  No acute intracranial abnormality. Scattered small foci of lucency throughout the calvarium are nonspecific. Findings could be secondary to osteopenia. A neoplastic process such as multiple myeloma  is thought to be less likely but cannot be completely excluded.  Fluid in the right mastoid air cells.  EKG done by cardiology reviewed.  Assessment/Plan 1. Medicare annual wellness visit, subsequent -up to date except her tdap--she is not certain she's had this in the past 10 years  2. Midline low back pain with sciatica, sciatica laterality unspecified - worse after sitting up straight yesterday from lying position - cont tramadol prn, but only using at hs - discussed ice and  stretching to help with acute pain - fentaNYL (DURAGESIC - DOSED MCG/HR) 25 MCG/HR patch; Place 1 patch (25 mcg total) onto the skin every 3 (three) days.  Dispense: 10 patch; Refill: 0  3. Constipation due to pain medication -cont fibercon but increase to 2 bid and tonight, take MOM or miralax to "clean out"  -also on stool softener  4. Senile osteoporosis -cont prolia, vitamin D, plans to try to resume exercise, but is having acute back pain again now so I'm not sure how soon that will happen  5. Essential hypertension -bp at goal with current regimen, no dizziness and did not c/o headache today  6. Chronic diastolic CHF (congestive heart failure) (HCC) -just had echo with good pump function and no change since last year, cont lasix -cont same meds  7. Paroxysmal atrial fibrillation (HCC) -cont amiodarone and xarelto  Labs/tests ordered:  No orders of the defined types were placed in this encounter.   Next appt:  11/05/2015 for med mgt  Jerre Diguglielmo L. Mechele Kittleson, D.O. [facility] [address], [place] [address] Cell Phone (Mon-Fri 8am-5pm):  [phone] On Call:  [phone] & follow prompts after 5pm & weekends Office Phone:  [phone] Office Fax:  [phone]

## 2015-08-07 DIAGNOSIS — H353221 Exudative age-related macular degeneration, left eye, with active choroidal neovascularization: Secondary | ICD-10-CM | POA: Diagnosis not present

## 2015-08-08 ENCOUNTER — Other Ambulatory Visit: Payer: Self-pay | Admitting: *Deleted

## 2015-08-26 ENCOUNTER — Other Ambulatory Visit: Payer: Self-pay | Admitting: Internal Medicine

## 2015-08-27 ENCOUNTER — Other Ambulatory Visit: Payer: Self-pay | Admitting: *Deleted

## 2015-08-27 DIAGNOSIS — M5440 Lumbago with sciatica, unspecified side: Secondary | ICD-10-CM

## 2015-08-27 NOTE — ED Provider Notes (Signed)
Medical screening examination/treatment/procedure(s) were conducted as a shared visit with non-physician practitioner(s) and myself.  I personally evaluated the patient during the encounter.  Pt. Feels better.  Agree with plan.  EKG Interpretation None       Isla Pence, MD 08/27/15 1001

## 2015-08-27 NOTE — Telephone Encounter (Signed)
[place] and would like to get her rx for fentanyl before she leaves, she will be due on July 28th. Please advise

## 2015-08-28 MED ORDER — FENTANYL 25 MCG/HR TD PT72: 25.0000 ug | MEDICATED_PATCH | TRANSDERMAL | Status: DC

## 2015-08-28 NOTE — Telephone Encounter (Signed)
Spoke with patient and advised results rx ready for pick-up and [facility].

## 2015-08-30 ENCOUNTER — Other Ambulatory Visit: Payer: Self-pay | Admitting: Hematology and Oncology

## 2015-08-30 ENCOUNTER — Other Ambulatory Visit: Payer: Self-pay | Admitting: Cardiology

## 2015-09-01 ENCOUNTER — Encounter: Payer: Self-pay | Admitting: Cardiology

## 2015-09-01 ENCOUNTER — Ambulatory Visit (INDEPENDENT_AMBULATORY_CARE_PROVIDER_SITE_OTHER): Payer: Medicare Other | Admitting: Cardiology

## 2015-09-01 VITALS — BP 110/56 | HR 60 | Ht 60.0 in | Wt 115.0 lb

## 2015-09-01 DIAGNOSIS — I480 Paroxysmal atrial fibrillation: Secondary | ICD-10-CM | POA: Diagnosis not present

## 2015-09-01 DIAGNOSIS — I5032 Chronic diastolic (congestive) heart failure: Secondary | ICD-10-CM

## 2015-09-01 DIAGNOSIS — I2510 Atherosclerotic heart disease of native coronary artery without angina pectoris: Secondary | ICD-10-CM | POA: Diagnosis not present

## 2015-09-01 NOTE — Patient Instructions (Signed)
Medication Instructions:  Your physician recommends that you continue on your current medications as directed. Please refer to the Current Medication list given to you today.   Labwork: CBCd/BMET today  Testing/Procedures: None   Follow-Up: Your physician wants you to follow-up in: 6 months with Dr Aundra Dubin in the [facility]. (January 2018)  You will receive a reminder letter in the mail two months in advance. If you don't receive a letter, please call our office to schedule the follow-up appointment.        If you need a refill on your cardiac medications before your next appointment, please call your pharmacy.

## 2015-09-01 NOTE — Progress Notes (Signed)
Patient ID: Sarah Mays, female   DOB: 1930-03-15, 82 y.o.   MRN: [id number] PCP: Dr Hollace Kinnier  82 yo with paroxysmal atrial fibrillation and chronic diastolic CHF returns for cardiology evaluation.  She is on amiodarone to try to maintain NSR.  She is tolerating Xarelto without melena or BRBPR.  In 7/16, she was admitted with chest pain.  Cardiolite showed no ischemia or infarction.  Echo in 6/17 showed EF 60-65%.    In 5/17, she was found to be in atrial flutter.  At a followup appt at the atrial fibrillation clinic, she was back in NSR.  She is in NSR today.  She does not feels palpitations.  BP is controlled.  She is short of breath with inclines, ok on flat ground.  No chest pain.  Using a walker.  Weight down 2 lbs.   Labs (7/13): K 4.7, creatinine 0.56 Labs (8/13): K 5.4, creatinine 0.69 Labs (11/13): TSH 6.45, K 5.4=>3.8, creatinine 1.5=>1.2, BUN 70=>35, HCT 33.2, BNP 866, AST 25, ALT 36 Labs (12/13): K 3.5 => 2.8, creatinine 1.2 => 1.33, BUN 56 Labs (1/14): LDL 85, HDL 86, K 3.9, creatinine 1.0, BNP 447 Labs (4/14): K 4.1, creatinine 0.9, BNP 293, LFTs normal, TSH normal Labs (8/14): K 4.3, creatinine 1.2, LFTs normal, TSH normal Labs (12/14): LFTs normal, TSH normal Labs (3/15): K 4.5, creatinine 1.3, HCT 36.2 Labs (10/15): K 5.1, creatinine 0.9, LFTs normal, TSH normal, pBNP 1893 Labs (3/16): K 3.9, creatinine 1.03, HCT 36.9, LFTs normal Labs (7/16): K 4, creatinine 0.94, calcium 10.8, LFTs normal Labs (9/16): K 4, creatinine 0.9, LDL 69, TSH normal, HCT 42.3 Labs (5/17): K 4.6, creatinine 0.94, HCT 41.5, LFTs normal. Labs (6/17): K 5, creatinine 1.04, LDL 42, HDL 101, TSH normal  ECG: NSR, iRBBB  PMH: 1. Atrial fibrillation: Diagnosed initially in 10/13.  Holter monitor in 11/13 showed atrial fibrillation with average rate 65. Back in NSR on amiodarone.   2. Chronic diastolic CHF: Echo ([id number]) with EF 65-70%, mild MR, moderate biatrial enlargement, moderate TR, PA  systolic pressure 45 mmHg.   Echo (10/15): EF 60-65%.  Echo (7/16) with EF 60-65%, mild MR.  - Echo (6/17): EF 60-65%, PASP 41 mmHg.  3. ETT-Sestamibi (11/13): Exercised to stage II, small partially reversible anteroapical perfusion defect suggesting mild ischemia versus attenuation, EF 80%.  Cardiolite (7/16) with EF 64%, no ischemia or infarction.  4. HTN: Lower extremity swelling with amlodipine.  5. Polycythemia vera: Has had phlebotomy only once.  6. Hypothyroidism 7. Macular degeneration 8. TAH 1980 9. Familial hypocalciuric hypercalcemia 10. PFTs (amiodarone use) in 1/14 showed a mild obstructive defect.  11. Degenerative disc disease  SH: Prior smoker (years ago).  Married, lived in [place] but now at [place]  Occasional ETOH.  Daughter works for [employer].   FH: CAD  ROS: All systems reviewed and negative except as per HPI.   Current Outpatient Prescriptions  Medication Sig Dispense Refill  . acetaminophen (TYLENOL) 650 MG CR tablet Take 650 mg by mouth 3 (three) times daily.    [name] Kitchen amiodarone (PACERONE) 200 MG tablet Take 1 tablet (200 mg total) by mouth daily.    . cholecalciferol (VITAMIN D) 1000 UNITS tablet Take 2,000 Units by mouth daily.     . fentaNYL (DURAGESIC - DOSED MCG/HR) 25 MCG/HR patch Place 1 patch (25 mcg total) onto the skin every 3 (three) days. 10 patch 0  . furosemide (LASIX) 40 MG tablet Take 2 tablets (80mg ) by mouth in  the AM and 1.5 tablets (60mg ) by mouth in the PM 315 tablet 1  . gabapentin (NEURONTIN) 100 MG capsule Take 100 mg by mouth daily.    . hydrALAZINE (APRESOLINE) 50 MG tablet Take 1 tablet (50 mg total) by mouth 3 (three) times daily.    . hydroxyurea (HYDREA) 500 MG capsule TAKE 2 CAPSULES ON MONDAYS AND TAKE 1 CAPSULE ALL OTHER DAYS OF THE WEEK. 35 capsule 0  . levothyroxine (SYNTHROID, LEVOTHROID) 112 MCG tablet TAKE 1 TABLET EACH DAY. 30 tablet 2  . Magnesium 250 MG TABS Take 250 mg by mouth daily. Reported on 06/19/2015    .  Melatonin 5 MG CAPS Take 5 mg by mouth at bedtime. Reported on 06/19/2015    . Multiple Vitamins-Minerals (PRESERVISION AREDS 2) CAPS Take 1 capsule by mouth 2 (two) times daily. Reported on 06/19/2015    . senna-docusate (SENOKOT-S) 8.6-50 MG per tablet Take 2 tablets by mouth 2 (two) times daily. 120 tablet 3  . traMADol (ULTRAM) 50 MG tablet TAKE 1 OR 2 TABLETS EVERY 6 HOURS AS NEEDED FOR PAIN 60 tablet 0  . XARELTO 15 MG TABS tablet TAKE 1 TABLET ONCE DAILY. 30 tablet 10   No current facility-administered medications for this visit.     BP (!) 110/56 (BP Location: Left Arm, Patient Position: Sitting, Cuff Size: Normal)   Pulse 60   Ht 5' (1.524 m)   Wt 115 lb (52.2 kg)   BMI 22.46 kg/m  General: NAD Neck: JVP 7 cm, no thyromegaly or thyroid nodule.  Lungs: Slight crackles at bases bilaterally.  CV: Nondisplaced PMI.  Heart regular S1/S2, no S3/S4, 1/6 early SEM.  No edema.  No carotid bruit.  Abdomen: Soft, nontender, no hepatosplenomegaly, no distention.  Neurologic: Alert and oriented x 3.  Psych: Normal affect. Extremities: No clubbing or cyanosis.   Assessment/Plan:  1. Atrial fibrillation: First noted in 10/13.  Atrial fibrillation has triggered acute on chronic diastolic CHF in the past.  CHADSVASC score is 9 (age, female gender, HTN, CHF).  She is anticoagulated with Xarelto 15 mg daily which is appropriate for her creatinine clearance.  She is off nebivolol due to bradycardia.  She remains in NSR today.  - Continue amiodarone 200 mg daily, increased from 100 mg daily given recent breakthrough atrial flutter.  Recent LFTs/TSH normal.  Given amiodarone use, she will need yearly eye exams.  Baseline PFTs showed a mild obstructive defect.   2. CAD: No chest pain.  7/16 Cardiolite showed no ischemia or infarction.  3. Chronic diastolic CHF:  NYHA class II symptoms currently. Volume status improved on current Lasix dosing. - Continue Lasix 80 qam/60 pm.  - BMET today as K was upper  normal when last checked.  4. HTN: BP reasonably controlled on current regimen.   5. Bradycardia: HR 60 today on amiodarone 200 mg daily.   6. Hypercalcemia: Mild.  Has diagnosis of familial hypocalciuric hypercalcemia.   Followup 6 months at CHF clinic.   Loralie Champagne 09/01/2015

## 2015-09-02 LAB — BASIC METABOLIC PANEL
BUN: 27 mg/dL — AB (ref 7–25)
CO2: 29 mmol/L (ref 20–31)
CREATININE: 1.24 mg/dL — AB (ref 0.60–0.88)
Calcium: 10.9 mg/dL — ABNORMAL HIGH (ref 8.6–10.4)
Chloride: 97 mmol/L — ABNORMAL LOW (ref 98–110)
Glucose, Bld: 111 mg/dL — ABNORMAL HIGH (ref 65–99)
Potassium: 4.5 mmol/L (ref 3.5–5.3)
Sodium: 139 mmol/L (ref 135–146)

## 2015-09-02 LAB — CBC WITH DIFFERENTIAL/PLATELET
Basophils Absolute: 106 cells/uL (ref 0–200)
Basophils Relative: 1 %
EOS ABS: 106 {cells}/uL (ref 15–500)
Eosinophils Relative: 1 %
HEMATOCRIT: 43.7 % (ref 35.0–45.0)
Hemoglobin: 14.7 g/dL (ref 11.7–15.5)
LYMPHS PCT: 15 %
Lymphs Abs: 1590 cells/uL (ref 850–3900)
MCH: 38.2 pg — ABNORMAL HIGH (ref 27.0–33.0)
MCHC: 33.6 g/dL (ref 32.0–36.0)
MCV: 113.5 fL — AB (ref 80.0–100.0)
MONO ABS: 848 {cells}/uL (ref 200–950)
MPV: 10.6 fL (ref 7.5–12.5)
Monocytes Relative: 8 %
NEUTROS PCT: 75 %
Neutro Abs: 7950 cells/uL — ABNORMAL HIGH (ref 1500–7800)
Platelets: 525 10*3/uL — ABNORMAL HIGH (ref 140–400)
RBC: 3.85 MIL/uL (ref 3.80–5.10)
RDW: 15.8 % — AB (ref 11.0–15.0)
WBC: 10.6 10*3/uL (ref 3.8–10.8)

## 2015-09-13 ENCOUNTER — Other Ambulatory Visit: Payer: Self-pay | Admitting: Cardiology

## 2015-09-15 DIAGNOSIS — H353221 Exudative age-related macular degeneration, left eye, with active choroidal neovascularization: Secondary | ICD-10-CM | POA: Diagnosis not present

## 2015-09-27 NOTE — Progress Notes (Signed)
[name] [address], [place] [address] Phone: [phone] Subjective:     CC: back pain follow up  [id number]  Sarah Mays is a 82 y.o. female coming in with complaint of back pain. Patient is a past medical history for severe osteoporosis. Patient had significant amount of degenerative changes of the lumbar spine and did have 4 epidural steroid injections as well as facet injection over the course last month. Patient's last injection was 05/27/2014.   Patient was having worsening pain and was not Sentinel patch. Feels that the pain is more on the lateral aspect of the hip. Patient states that it wakes her up at night. Some mild radiation down the leg. Mild weakness but nothing significant. Patient is ambulating with the aid of a walker. Patient states that it seems to be worsening over the course of time.  Past Medical History:  Diagnosis Date  . Abnormal stress test    a. 11/2011 Ex MV: EF 80%, small, partially reversible anteroapical defect->mild ischemia vs attenuation-->med Rx.  [name] Kitchen Alopecia, unspecified   . Backache, unspecified   . Chronic diastolic CHF (congestive heart failure) ([facility])    a. 11/2013 Echo: EF 60-65%, no rwma, mild TR, PASP 37mmHg.  . Closed fracture of lower end of radius with ulna   . Deafness    Left, s/p multiple surgeries  . Edema   . Essential hypertension   . Gouty arthropathy, unspecified   . Headache(784.0)   . Hypercalcemia   . Hyperpotassemia   . Hyposmolality and/or hyponatremia   . Hypothyroidism   . Insomnia, unspecified   . Macular degeneration    Left, s/p inj. tx  . Mitral valve disorders   . Neck mass    right, w/u including MRI negative  . Other malaise and fatigue   . PAF (paroxysmal atrial fibrillation) (HCC)    a. on amio (02/2012 mild obstruction on PFT's) and xarelto.  . Polycythemia vera(238.4)   . Pure hypercholesterolemia   . Senile osteoporosis    Reclast in the past  .  Tricuspid valve disorders, specified as nonrheumatic    Past Surgical History:  Procedure Laterality Date  . ABLATION  08/28/14   Back   . BREAST BIOPSY  06/18/1998   left  . BREAST BIOPSY    . TONSILLECTOMY    . TOTAL ABDOMINAL HYSTERECTOMY    . TYMPANOPLASTY Bilateral    Social History  Substance Use Topics  . Smoking status: Former Smoker    Types: Cigarettes    Quit date: 01/19/1984  . Smokeless tobacco: Never Used  . Alcohol use 0.6 oz/week    1 Glasses of wine per week     Comment: 2 per day/ 14 a week   Allergies  Allergen Reactions  . Amlodipine Swelling    Lower extremity edema    Family History  Problem Relation Age of Onset  . Hypertension Father   . Heart disease Father     patient does not know details.   . Breast cancer Daughter   . Cancer Daughter     breast  . Ovarian cancer Mother 77  . Cancer Sister     colon  . Heart disease Sister   . Cancer Sister   . Cancer Sister     hodgkin disease      Past medical history, social, surgical and family history all reviewed in electronic medical record.   Patient's MRI on 04/15/2014 shows advanced osteophytic  changes along by her spine as well as a bulging degenerative annular with flattening of the ventral thecal sac at L4-L5 on the left at L3-L4 affecting L3 and L4 nerve roots.  Review of Systems: No headache, visual changes, nausea, vomiting, diarrhea, constipation, dizziness, abdominal pain, skin rash, fevers, chills, night sweats, weight loss, swollen lymph nodes, body aches, joint swelling, muscle aches, chest pain, shortness of breath, mood changes.   Objective  Blood pressure 128/74, pulse 63, weight 116 lb (52.6 kg), SpO2 94 %.  General: No apparent distress alert and oriented x3 mood and affect normal, dressed appropriately.  HEENT: Pupils equal, extraocular movements intact  Respiratory: Patient's speak in full sentences and does not appear short of breath  Cardiovascular: No lower extremity  edema, non tender, no erythema  Skin: Warm dry intact with no signs of infection or rash on extremities or on axial skeleton.  Abdomen: Soft nontender  Neuro: Cranial nerves II through XII are intact, neurovascularly intact in all extremities with 2+ DTRs and 2+ pulses.  Lymph: No lymphadenopathy of posterior or anterior cervical chain or axillae bilaterally.  Gait antalgic with patient having weakness of the right leg compared to the contralateral side. MSK:  Non tender with full range of motion and good stability and symmetric strength and tone of shoulders, elbows, wrist, hip, knee and ankles bilaterally.  Back Exam:  Inspection: Unremarkable mild increasing kyphosis Motion: Flexion 30 deg, Extension 15 deg, Side Bending to 30 deg bilaterally,  Rotation to 35 deg bilaterally  SLR laying: Negative  XSLR laying: Negative  Palpable tenderness: Severe tenderness over the greater trochanteric area on the right side some mild pain over the iliotibial band. Mild pain with internal range of motion. FABER: Continue positive right worsening pain of the lateral aspect Sensory change: Gross sensation intact to all lumbar and sacral dermatomes.  Reflexes: 2+ at both patellar tendons, 2+ at achilles tendons, Babinski's downgoing.  Strength at foot  3+ out of 5 strength of the right hip flexor compared to 4 out of 5 strength on the left side  MSK US performed of: Right This study was ordered, performed, and interpreted by Charlann Boxer D.O.  Hip: Trochanteric bursa with significant hypoechoic changes and swelling Acetabular labrum visualized and without tears, displacement, or effusion in joint. Femoral neck appears unremarkable without increased power doppler signal along Cortex.  IMPRESSION:  Greater trochanter bursitis   Procedure: Real-time Ultrasound Guided Injection of right greater trochanteric bursitis secondary to patient's body habitus Device: GE Logiq E  Ultrasound guided injection is  preferred based studies that show increased duration, increased effect, greater accuracy, decreased procedural pain, increased response rate, and decreased cost with ultrasound guided versus blind injection.  Verbal informed consent obtained.  Time-out conducted.  Noted no overlying erythema, induration, or other signs of local infection.  Skin prepped in a sterile fashion.  Local anesthesia: Topical Ethyl chloride.  With sterile technique and under real time ultrasound guidance:  Greater trochanteric area was visualized and patient's bursa was noted. A 22-gauge 3 inch needle was inserted and 4 cc of 0.5% Marcaine and 1 cc of Kenalog 40 mg/dL was injected. Pictures taken Completed without difficulty  Pain immediately resolved suggesting accurate placement of the medication.  Advised to call if fevers/chills, erythema, induration, drainage, or persistent bleeding.  Images permanently stored and available for review in the ultrasound unit.  Impression: Technically successful ultrasound guided injection.       Impression and Recommendations:     This  case required medical decision making of moderate complexity.

## 2015-09-29 ENCOUNTER — Ambulatory Visit: Payer: Self-pay

## 2015-09-29 ENCOUNTER — Encounter: Payer: Self-pay | Admitting: Family Medicine

## 2015-09-29 ENCOUNTER — Encounter: Payer: Self-pay | Admitting: Internal Medicine

## 2015-09-29 ENCOUNTER — Ambulatory Visit (INDEPENDENT_AMBULATORY_CARE_PROVIDER_SITE_OTHER): Payer: Medicare Other | Admitting: Internal Medicine

## 2015-09-29 ENCOUNTER — Ambulatory Visit (INDEPENDENT_AMBULATORY_CARE_PROVIDER_SITE_OTHER): Payer: Medicare Other | Admitting: Family Medicine

## 2015-09-29 VITALS — BP 128/74 | HR 63 | Wt 116.0 lb

## 2015-09-29 VITALS — BP 130/70 | HR 65 | Temp 98.0°F | Wt 117.0 lb

## 2015-09-29 DIAGNOSIS — I2510 Atherosclerotic heart disease of native coronary artery without angina pectoris: Secondary | ICD-10-CM

## 2015-09-29 DIAGNOSIS — M7061 Trochanteric bursitis, right hip: Secondary | ICD-10-CM | POA: Diagnosis not present

## 2015-09-29 DIAGNOSIS — M5440 Lumbago with sciatica, unspecified side: Secondary | ICD-10-CM | POA: Diagnosis not present

## 2015-09-29 DIAGNOSIS — M25551 Pain in right hip: Secondary | ICD-10-CM

## 2015-09-29 DIAGNOSIS — J0100 Acute maxillary sinusitis, unspecified: Secondary | ICD-10-CM

## 2015-09-29 MED ORDER — AMOXICILLIN-POT CLAVULANATE 875-125 MG PO TABS: 1.0000 | ORAL_TABLET | Freq: Two times a day (BID) | ORAL | 0 refills | Status: DC

## 2015-09-29 MED ORDER — GUAIFENESIN ER 600 MG PO TB12: 600.0000 mg | ORAL_TABLET | Freq: Two times a day (BID) | ORAL | 0 refills | Status: DC

## 2015-09-29 NOTE — Patient Instructions (Signed)
Good to see you  We injected the side of the hip today that should help We will hold on looking at the hip or the back.  Ice 20 minutes 2 times daily. Usually after activity and before bed. pennsaid pinkie amount topically 2 times daily as needed.  Good shoes with rigid bottom.  Jalene Mullet, Merrell or New balance greater then 700 Stretches maybe a little before bed when feeling tight.   See me again when you need me.

## 2015-09-29 NOTE — Assessment & Plan Note (Signed)
Given injection today and tolerated the procedure well. Continued to have an antalgic gait and does have some mild weakness of the large cavity. Continued have pain we may need to consider looking at her back again. Patient at this time does not wish to do that. Patient is already on a quite severe pain medications. We discussed icing regimen and given topical anti-inflammatories. Patient is encouraged to continue the low dose gabapentin. Follow-up in 3-4 weeks if worsening symptoms.

## 2015-09-29 NOTE — Progress Notes (Signed)
Location:  [facility] clinic Provider: Akira Perusse L. Mariea Clonts, D.O., C.M.D.  Code Status: DNR Goals of Care:  Advanced Directives 08/06/2015  Does patient have an advance directive? Yes  Type of Paramedic of [place];Living will  Does patient want to make changes to advanced directive? -  Copy of advanced directive(s) in chart? Yes  Would patient like information on creating an advanced directive? -  Pre-existing out of facility DNR order (yellow form or pink MOST form) -     Chief Complaint  Patient presents with  . Acute Visit    sinus    HPI: Patient is a 82 y.o. female seen today for an acute visit for sinus infection.   A week ago Monday, she woke up with a self-diagnosed sinus infection.  She was coughing up yellow junk.  Thursday, she was sneezing her head off, nose running and still coughing up yellow junk.  Better overall since Saturday.  Feels like she needs to cough something up.  Her good ear is stopped up.  Took some leftover Rx nasal spray which she has been using.  Did take coricidin bp at first, and at night.  Hasn't felt like she had chills/fever.  No fever Friday with [name].  She is feeling very sleepy if she sits any length of time and thinks it's her fentanyl.  She'd like to be able to reduce her dose, but just had a bursitis flare and got an injection this am with benefit.    Past Medical History:  Diagnosis Date  . Abnormal stress test    a. 11/2011 Ex MV: EF 80%, small, partially reversible anteroapical defect->mild ischemia vs attenuation-->med Rx.  [name] Kitchen Alopecia, unspecified   . Backache, unspecified   . Chronic diastolic CHF (congestive heart failure) ([facility])    a. 11/2013 Echo: EF 60-65%, no rwma, mild TR, PASP 25mmHg.  . Closed fracture of lower end of radius with ulna   . Deafness    Left, s/p multiple surgeries  . Edema   . Essential hypertension   . Gouty arthropathy, unspecified   . Headache(784.0)   . Hypercalcemia   .  Hyperpotassemia   . Hyposmolality and/or hyponatremia   . Hypothyroidism   . Insomnia, unspecified   . Macular degeneration    Left, s/p inj. tx  . Mitral valve disorders   . Neck mass    right, w/u including MRI negative  . Other malaise and fatigue   . PAF (paroxysmal atrial fibrillation) (HCC)    a. on amio (02/2012 mild obstruction on PFT's) and xarelto.  . Polycythemia vera(238.4)   . Pure hypercholesterolemia   . Senile osteoporosis    Reclast in the past  . Tricuspid valve disorders, specified as nonrheumatic     Past Surgical History:  Procedure Laterality Date  . ABLATION  08/28/14   Back   . BREAST BIOPSY  06/18/1998   left  . BREAST BIOPSY    . TONSILLECTOMY    . TOTAL ABDOMINAL HYSTERECTOMY    . TYMPANOPLASTY Bilateral     Allergies  Allergen Reactions  . Amlodipine Swelling    Lower extremity edema       Medication List       Accurate as of 09/29/15  3:32 PM. Always use your most recent med list.          acetaminophen 650 MG CR tablet Commonly known as:  TYLENOL Take 650 mg by mouth 3 (three) times daily.   amiodarone 200  MG tablet Commonly known as:  PACERONE TAKE 1 TABLET ONCE DAILY.   cholecalciferol 1000 units tablet Commonly known as:  VITAMIN D Take 2,000 Units by mouth daily.   fentaNYL 25 MCG/HR patch Commonly known as:  DURAGESIC - dosed mcg/hr Place 1 patch (25 mcg total) onto the skin every 3 (three) days.   furosemide 40 MG tablet Commonly known as:  LASIX Take 2 tablets (80mg ) by mouth in the AM and 1.5 tablets (60mg ) by mouth in the PM   gabapentin 100 MG capsule Commonly known as:  NEURONTIN Take 100 mg by mouth daily.   hydrALAZINE 50 MG tablet Commonly known as:  APRESOLINE TAKE 1 1/2 TABLETS THREE TIMES DAILY.   hydroxyurea 500 MG capsule Commonly known as:  HYDREA TAKE 2 CAPSULES ON MONDAYS AND TAKE 1 CAPSULE ALL OTHER DAYS OF THE WEEK.   levothyroxine 112 MCG tablet Commonly known as:  SYNTHROID,  LEVOTHROID TAKE 1 TABLET EACH DAY.   Magnesium 250 MG Tabs Take 250 mg by mouth daily. Reported on 06/19/2015   Melatonin 5 MG Caps Take 5 mg by mouth at bedtime. Reported on 06/19/2015   PRESERVISION AREDS 2 Caps Take 1 capsule by mouth 2 (two) times daily. Reported on 06/19/2015   senna-docusate 8.6-50 MG tablet Commonly known as:  Senokot-S Take 2 tablets by mouth 2 (two) times daily.   traMADol 50 MG tablet Commonly known as:  ULTRAM TAKE 1 OR 2 TABLETS EVERY 6 HOURS AS NEEDED FOR PAIN   XARELTO 15 MG Tabs tablet Generic drug:  Rivaroxaban TAKE 1 TABLET ONCE DAILY.       Review of Systems:  Review of Systems  Constitutional: Positive for malaise/fatigue. Negative for chills and fever.  HENT: Positive for congestion and hearing loss. Negative for sore throat.        Ear pressure  Respiratory: Positive for cough and sputum production. Negative for shortness of breath.   Cardiovascular: Negative for chest pain, palpitations and leg swelling.  Musculoskeletal: Positive for back pain and joint pain. Negative for falls.       Right hip pain  Skin: Negative for itching and rash.  Neurological: Positive for headaches. Negative for weakness.    Health Maintenance  Topic Date Due  . TETANUS/TDAP  08/03/1949  . INFLUENZA VACCINE  09/09/2015  . DEXA SCAN  Completed  . ZOSTAVAX  Completed  . PNA vac Low Risk Adult  Completed    Physical Exam: Vitals:   09/29/15 1528  BP: 130/70  Pulse: 65  Temp: 98 F (36.7 C)  TempSrc: Oral  SpO2: 93%  Weight: 117 lb (53.1 kg)   Body mass index is 22.85 kg/m. Physical Exam  Constitutional: She is oriented to person, place, and time. She appears well-developed and well-nourished. No distress.  HENT:  Head: Normocephalic and atraumatic.  Right Ear: External ear normal.  Left Ear: External ear normal.  Nose: Nose normal.  Mouth/Throat: Oropharynx is clear and moist. No oropharyngeal exudate.  Sinus tenderness over maxillary  sinuses; increased cerumen and slight dullness of her left TM  Eyes: Conjunctivae are normal.  Neck: No JVD present.  Cardiovascular: Normal rate, regular rhythm, normal heart sounds and intact distal pulses.   Pulmonary/Chest: Effort normal and breath sounds normal. No respiratory distress.  No rhonchi  Musculoskeletal: Normal range of motion.  Is walking with limp with her rolling walker  Lymphadenopathy:    She has cervical adenopathy.  Neurological: She is alert and oriented to person, place, and  time. No cranial nerve deficit.  Skin: Skin is warm and dry.    Labs reviewed: Basic Metabolic Panel:  Recent Labs  10/29/14  06/20/15 0903 07/18/15 1506 08/05/15 1456 09/01/15 1352  NA 137  < > 139  --  137 139  K 4.0  < > 4.6  --  5.0 4.5  CL  --   < > 98  --  97* 97*  CO2  --   < > 25  --  29 29  GLUCOSE  --   < > 79  --  70 111*  BUN 22*  < > 24  --  25 27*  CREATININE 0.9  < > 0.94  --  1.04* 1.24*  CALCIUM  --   < > 11.3*  --  11.1* 10.9*  TSH 0.72  --   --  2.21  --   --   < > = values in this interval not displayed. Liver Function Tests:  Recent Labs  11/29/14 1518 06/20/15 0903  AST 22 17  ALT 17 11  ALKPHOS 61 72  BILITOT 0.7 0.7  PROT 6.8 6.2  ALBUMIN 4.3 4.6   No results for input(s): LIPASE, AMYLASE in the last 8760 hours. No results for input(s): AMMONIA in the last 8760 hours. CBC:  Recent Labs  11/07/14 1247 05/09/15 1445 06/20/15 0903 09/01/15 1352  WBC 8.3 9.5 10.8 10.6  NEUTROABS 5.7 6.5 8.0* 7,950*  HGB 14.1 14.1  --  14.7  HCT 42.3 41.9 41.5 43.7  MCV 113.1* 114.2* 113* 113.5*  PLT 382 498* 487* 525*   Lipid Panel:  Recent Labs  10/29/14 07/29/15 1200  CHOL 164 162  HDL 78* 101*  LDLCALC 69 42  TRIG 133 100   No results found for: HGBA1C   Assessment/Plan 1. Acute maxillary sinusitis, recurrence not specified - treat with abx due to suspected bacterial infection on top of viral - guaiFENesin (MUCINEX) 600 MG 12 hr tablet;  Take 1 tablet (600 mg total) by mouth 2 (two) times daily.  Dispense: 30 tablet; Refill: 0 - amoxicillin-clavulanate (AUGMENTIN) 875-125 MG tablet; Take 1 tablet by mouth 2 (two) times daily.  Dispense: 20 tablet; Refill: 0  2. Trochanteric bursitis of right hip -s/p injection with some improvement so far from a few hours ago -she will call back if she gets enough improvement to reduce her fentanyl back to 72mcg again (to help with her sedation)  3. Midline low back pain with sciatica, sciatica laterality unspecified -chronic---continues on fentanyl and tramadol  Labs/tests ordered:  No orders of the defined types were placed in this encounter.  Next appt:  11/05/2015  Imanie Darrow L. Marcellas Marchant, D.O. [facility] [address], [place] [address] Cell Phone (Mon-Fri 8am-5pm):  [phone] On Call:  [phone] & follow prompts after 5pm & weekends Office Phone:  [phone] Office Fax:  [phone]

## 2015-10-03 ENCOUNTER — Other Ambulatory Visit: Payer: Self-pay | Admitting: Internal Medicine

## 2015-10-05 ENCOUNTER — Other Ambulatory Visit: Payer: Self-pay | Admitting: Hematology and Oncology

## 2015-10-05 ENCOUNTER — Inpatient Hospital Stay (HOSPITAL_COMMUNITY)
Admission: EM | Admit: 2015-10-05 | Discharge: 2015-10-10 | DRG: 481 | Disposition: A | Discharge status: Skilled Nursing Facility | Payer: Medicare Other | Attending: Internal Medicine | Admitting: Internal Medicine

## 2015-10-05 ENCOUNTER — Emergency Department (HOSPITAL_COMMUNITY): Payer: Medicare Other

## 2015-10-05 ENCOUNTER — Encounter (HOSPITAL_COMMUNITY): Payer: Self-pay | Admitting: Emergency Medicine

## 2015-10-05 DIAGNOSIS — I2510 Atherosclerotic heart disease of native coronary artery without angina pectoris: Secondary | ICD-10-CM | POA: Diagnosis present

## 2015-10-05 DIAGNOSIS — E038 Other specified hypothyroidism: Secondary | ICD-10-CM | POA: Diagnosis not present

## 2015-10-05 DIAGNOSIS — M160 Bilateral primary osteoarthritis of hip: Secondary | ICD-10-CM | POA: Diagnosis present

## 2015-10-05 DIAGNOSIS — I5032 Chronic diastolic (congestive) heart failure: Secondary | ICD-10-CM | POA: Diagnosis present

## 2015-10-05 DIAGNOSIS — D72829 Elevated white blood cell count, unspecified: Secondary | ICD-10-CM | POA: Diagnosis present

## 2015-10-05 DIAGNOSIS — H6690 Otitis media, unspecified, unspecified ear: Secondary | ICD-10-CM | POA: Diagnosis not present

## 2015-10-05 DIAGNOSIS — M25559 Pain in unspecified hip: Secondary | ICD-10-CM | POA: Diagnosis not present

## 2015-10-05 DIAGNOSIS — H65191 Other acute nonsuppurative otitis media, right ear: Secondary | ICD-10-CM | POA: Diagnosis not present

## 2015-10-05 DIAGNOSIS — D45 Polycythemia vera: Secondary | ICD-10-CM | POA: Diagnosis not present

## 2015-10-05 DIAGNOSIS — Z66 Do not resuscitate: Secondary | ICD-10-CM | POA: Diagnosis not present

## 2015-10-05 DIAGNOSIS — M25551 Pain in right hip: Secondary | ICD-10-CM | POA: Diagnosis not present

## 2015-10-05 DIAGNOSIS — D509 Iron deficiency anemia, unspecified: Secondary | ICD-10-CM | POA: Diagnosis not present

## 2015-10-05 DIAGNOSIS — M86151 Other acute osteomyelitis, right femur: Secondary | ICD-10-CM | POA: Diagnosis not present

## 2015-10-05 DIAGNOSIS — M818 Other osteoporosis without current pathological fracture: Secondary | ICD-10-CM | POA: Diagnosis not present

## 2015-10-05 DIAGNOSIS — I482 Chronic atrial fibrillation: Secondary | ICD-10-CM | POA: Diagnosis not present

## 2015-10-05 DIAGNOSIS — S7291XG Unspecified fracture of right femur, subsequent encounter for closed fracture with delayed healing: Secondary | ICD-10-CM | POA: Diagnosis not present

## 2015-10-05 DIAGNOSIS — E039 Hypothyroidism, unspecified: Secondary | ICD-10-CM | POA: Diagnosis present

## 2015-10-05 DIAGNOSIS — M62561 Muscle wasting and atrophy, not elsewhere classified, right lower leg: Secondary | ICD-10-CM | POA: Diagnosis not present

## 2015-10-05 DIAGNOSIS — Z79899 Other long term (current) drug therapy: Secondary | ICD-10-CM | POA: Diagnosis not present

## 2015-10-05 DIAGNOSIS — R52 Pain, unspecified: Secondary | ICD-10-CM | POA: Diagnosis not present

## 2015-10-05 DIAGNOSIS — I480 Paroxysmal atrial fibrillation: Secondary | ICD-10-CM | POA: Diagnosis present

## 2015-10-05 DIAGNOSIS — S72001D Fracture of unspecified part of neck of right femur, subsequent encounter for closed fracture with routine healing: Secondary | ICD-10-CM | POA: Diagnosis not present

## 2015-10-05 DIAGNOSIS — I110 Hypertensive heart disease with heart failure: Secondary | ICD-10-CM | POA: Diagnosis not present

## 2015-10-05 DIAGNOSIS — Z419 Encounter for procedure for purposes other than remedying health state, unspecified: Secondary | ICD-10-CM

## 2015-10-05 DIAGNOSIS — R2689 Other abnormalities of gait and mobility: Secondary | ICD-10-CM | POA: Diagnosis not present

## 2015-10-05 DIAGNOSIS — Z87891 Personal history of nicotine dependence: Secondary | ICD-10-CM | POA: Diagnosis not present

## 2015-10-05 DIAGNOSIS — S72001S Fracture of unspecified part of neck of right femur, sequela: Secondary | ICD-10-CM | POA: Diagnosis not present

## 2015-10-05 DIAGNOSIS — K5900 Constipation, unspecified: Secondary | ICD-10-CM | POA: Diagnosis present

## 2015-10-05 DIAGNOSIS — R2681 Unsteadiness on feet: Secondary | ICD-10-CM | POA: Diagnosis not present

## 2015-10-05 DIAGNOSIS — M8610 Other acute osteomyelitis, unspecified site: Secondary | ICD-10-CM

## 2015-10-05 DIAGNOSIS — M84351A Stress fracture, right femur, initial encounter for fracture: Principal | ICD-10-CM | POA: Diagnosis present

## 2015-10-05 DIAGNOSIS — S72001A Fracture of unspecified part of neck of right femur, initial encounter for closed fracture: Secondary | ICD-10-CM | POA: Diagnosis not present

## 2015-10-05 DIAGNOSIS — M869 Osteomyelitis, unspecified: Secondary | ICD-10-CM | POA: Diagnosis not present

## 2015-10-05 DIAGNOSIS — X58XXXA Exposure to other specified factors, initial encounter: Secondary | ICD-10-CM | POA: Diagnosis not present

## 2015-10-05 DIAGNOSIS — Z7901 Long term (current) use of anticoagulants: Secondary | ICD-10-CM | POA: Diagnosis not present

## 2015-10-05 DIAGNOSIS — I10 Essential (primary) hypertension: Secondary | ICD-10-CM | POA: Diagnosis not present

## 2015-10-05 DIAGNOSIS — S72141A Displaced intertrochanteric fracture of right femur, initial encounter for closed fracture: Secondary | ICD-10-CM | POA: Diagnosis not present

## 2015-10-05 DIAGNOSIS — R600 Localized edema: Secondary | ICD-10-CM | POA: Diagnosis not present

## 2015-10-05 DIAGNOSIS — S7291XA Unspecified fracture of right femur, initial encounter for closed fracture: Secondary | ICD-10-CM | POA: Diagnosis not present

## 2015-10-05 DIAGNOSIS — I4891 Unspecified atrial fibrillation: Secondary | ICD-10-CM | POA: Diagnosis not present

## 2015-10-05 DIAGNOSIS — H6192 Disorder of left external ear, unspecified: Secondary | ICD-10-CM | POA: Diagnosis not present

## 2015-10-05 DIAGNOSIS — R278 Other lack of coordination: Secondary | ICD-10-CM | POA: Diagnosis not present

## 2015-10-05 DIAGNOSIS — M79604 Pain in right leg: Secondary | ICD-10-CM | POA: Diagnosis not present

## 2015-10-05 DIAGNOSIS — Z4789 Encounter for other orthopedic aftercare: Secondary | ICD-10-CM | POA: Diagnosis not present

## 2015-10-05 DIAGNOSIS — M84451A Pathological fracture, right femur, initial encounter for fracture: Secondary | ICD-10-CM | POA: Diagnosis not present

## 2015-10-05 DIAGNOSIS — M629 Disorder of muscle, unspecified: Secondary | ICD-10-CM | POA: Diagnosis not present

## 2015-10-05 LAB — CBC WITH DIFFERENTIAL/PLATELET
BASOS ABS: 0 10*3/uL (ref 0.0–0.1)
Basophils Relative: 0 %
EOS ABS: 0.1 10*3/uL (ref 0.0–0.7)
Eosinophils Relative: 1 %
HEMATOCRIT: 40.9 % (ref 36.0–46.0)
HEMOGLOBIN: 13.8 g/dL (ref 12.0–15.0)
LYMPHS PCT: 7 %
Lymphs Abs: 0.9 10*3/uL (ref 0.7–4.0)
MCH: 38.3 pg — ABNORMAL HIGH (ref 26.0–34.0)
MCHC: 33.7 g/dL (ref 30.0–36.0)
MCV: 113.6 fL — ABNORMAL HIGH (ref 78.0–100.0)
MONOS PCT: 6 %
Monocytes Absolute: 0.8 10*3/uL (ref 0.1–1.0)
NEUTROS PCT: 86 %
Neutro Abs: 10.9 10*3/uL — ABNORMAL HIGH (ref 1.7–7.7)
Platelets: 486 10*3/uL — ABNORMAL HIGH (ref 150–400)
RBC: 3.6 MIL/uL — AB (ref 3.87–5.11)
RDW: 15.3 % (ref 11.5–15.5)
WBC: 12.7 10*3/uL — AB (ref 4.0–10.5)

## 2015-10-05 LAB — BASIC METABOLIC PANEL
ANION GAP: 6 (ref 5–15)
BUN: 35 mg/dL — ABNORMAL HIGH (ref 6–20)
CHLORIDE: 102 mmol/L (ref 101–111)
CO2: 31 mmol/L (ref 22–32)
CREATININE: 0.8 mg/dL (ref 0.44–1.00)
Calcium: 10.2 mg/dL (ref 8.9–10.3)
GFR calc non Af Amer: >60 mL/min (ref >60)
Glucose, Bld: 103 mg/dL — ABNORMAL HIGH (ref 65–99)
POTASSIUM: 3.8 mmol/L (ref 3.5–5.1)
SODIUM: 139 mmol/L (ref 135–145)

## 2015-10-05 MED ORDER — HYDRALAZINE HCL 50 MG PO TABS
50.0000 mg | ORAL_TABLET | Freq: Three times a day (TID) | ORAL | Status: DC
  Administered 2015-10-06 – 2015-10-10 (×10): 50 mg via ORAL
  Filled 2015-10-05 (×10): qty 1

## 2015-10-05 MED ORDER — LORAZEPAM 2 MG/ML IJ SOLN
0.5000 mg | Freq: Once | INTRAMUSCULAR | Status: AC
  Administered 2015-10-05: 0.5 mg via INTRAVENOUS
  Filled 2015-10-05: qty 1

## 2015-10-05 MED ORDER — LEVOTHYROXINE SODIUM 112 MCG PO TABS
112.0000 ug | ORAL_TABLET | Freq: Every day | ORAL | Status: DC
  Administered 2015-10-06 – 2015-10-10 (×5): 112 ug via ORAL
  Filled 2015-10-05 (×6): qty 1

## 2015-10-05 MED ORDER — VANCOMYCIN HCL IN DEXTROSE 750-5 MG/150ML-% IV SOLN
750.0000 mg | INTRAVENOUS | Status: DC
  Administered 2015-10-06: 750 mg via INTRAVENOUS
  Filled 2015-10-05 (×2): qty 150

## 2015-10-05 MED ORDER — SENNOSIDES-DOCUSATE SODIUM 8.6-50 MG PO TABS
2.0000 | ORAL_TABLET | Freq: Two times a day (BID) | ORAL | Status: DC
  Administered 2015-10-06 – 2015-10-10 (×9): 2 via ORAL
  Filled 2015-10-05 (×9): qty 2

## 2015-10-05 MED ORDER — MORPHINE SULFATE (PF) 2 MG/ML IV SOLN
2.0000 mg | Freq: Once | INTRAVENOUS | Status: AC
  Administered 2015-10-05: 2 mg via INTRAVENOUS
  Filled 2015-10-05: qty 1

## 2015-10-05 MED ORDER — PIPERACILLIN-TAZOBACTAM 3.375 G IVPB
3.3750 g | Freq: Three times a day (TID) | INTRAVENOUS | Status: DC
  Administered 2015-10-06 (×2): 3.375 g via INTRAVENOUS
  Filled 2015-10-05 (×3): qty 50

## 2015-10-05 MED ORDER — DEXAMETHASONE SODIUM PHOSPHATE 10 MG/ML IJ SOLN
10.0000 mg | Freq: Once | INTRAMUSCULAR | Status: AC
Start: 2015-10-05 — End: 2015-10-05
  Administered 2015-10-05: 10 mg via INTRAVENOUS
  Filled 2015-10-05: qty 1

## 2015-10-05 MED ORDER — RIVAROXABAN 15 MG PO TABS
15.0000 mg | ORAL_TABLET | Freq: Every day | ORAL | Status: DC
  Administered 2015-10-06 (×2): 15 mg via ORAL
  Filled 2015-10-05 (×2): qty 1

## 2015-10-05 MED ORDER — GADOBENATE DIMEGLUMINE 529 MG/ML IV SOLN
10.0000 mL | Freq: Once | INTRAVENOUS | Status: AC | PRN
Start: 2015-10-05 — End: 2015-10-05
  Administered 2015-10-05: 10 mL via INTRAVENOUS

## 2015-10-05 MED ORDER — TRAMADOL HCL 50 MG PO TABS
50.0000 mg | ORAL_TABLET | Freq: Four times a day (QID) | ORAL | Status: DC | PRN
  Administered 2015-10-06 (×2): 100 mg via ORAL
  Administered 2015-10-06: 50 mg via ORAL
  Administered 2015-10-07 – 2015-10-08 (×4): 100 mg via ORAL
  Filled 2015-10-05 (×2): qty 2
  Filled 2015-10-05: qty 1
  Filled 2015-10-05 (×5): qty 2

## 2015-10-05 MED ORDER — MAGNESIUM OXIDE 400 (241.3 MG) MG PO TABS
200.0000 mg | ORAL_TABLET | Freq: Every day | ORAL | Status: DC
  Administered 2015-10-06 – 2015-10-10 (×4): 200 mg via ORAL
  Filled 2015-10-05 (×4): qty 1

## 2015-10-05 MED ORDER — AMIODARONE HCL 200 MG PO TABS
200.0000 mg | ORAL_TABLET | Freq: Every day | ORAL | Status: DC
  Administered 2015-10-06 – 2015-10-10 (×5): 200 mg via ORAL
  Filled 2015-10-05 (×5): qty 1

## 2015-10-05 MED ORDER — FENTANYL 25 MCG/HR TD PT72
25.0000 ug | MEDICATED_PATCH | TRANSDERMAL | Status: DC
  Administered 2015-10-08: 25 ug via TRANSDERMAL
  Filled 2015-10-05: qty 1

## 2015-10-05 NOTE — ED Provider Notes (Signed)
[facility] DEPT Provider Note   CSN: [id number] Arrival date & time: 10/05/15  1623     History   Chief Complaint Chief Complaint  Patient presents with  . Leg Injury  . Hip Pain    R    HPI ABIE GROH is a 82 y.o. female.  82 year old female presents with son onset of right hip pain which occurred after she kicked her right leg with her left foot. Has a history of right hip bursitis and had an injection this week in that right hip. Has had pain since then. Does normally ambulate with a walker. Today while taken off her shoes she inadvertently kicked her right leg. Had severe sharp pain that was worse with movement. EMS was called and patient given 100 mg of fentanyl and does feel better. She denies any numbness or tingling to her right foot. Denies any history of direct trauma.      Past Medical History:  Diagnosis Date  . Abnormal stress test    a. 11/2011 Ex MV: EF 80%, small, partially reversible anteroapical defect->mild ischemia vs attenuation-->med Rx.  [name] Kitchen Alopecia, unspecified   . Backache, unspecified   . Chronic diastolic CHF (congestive heart failure) ([place])    a. 11/2013 Echo: EF 60-65%, no rwma, mild TR, PASP 63mmHg.  . Closed fracture of lower end of radius with ulna   . Deafness    Left, s/p multiple surgeries  . Edema   . Essential hypertension   . Gouty arthropathy, unspecified   . Headache(784.0)   . Hypercalcemia   . Hyperpotassemia   . Hyposmolality and/or hyponatremia   . Hypothyroidism   . Insomnia, unspecified   . Macular degeneration    Left, s/p inj. tx  . Mitral valve disorders   . Neck mass    right, w/u including MRI negative  . Other malaise and fatigue   . PAF (paroxysmal atrial fibrillation) (HCC)    a. on amio (02/2012 mild obstruction on PFT's) and xarelto.  . Polycythemia vera(238.4)   . Pure hypercholesterolemia   . Senile osteoporosis    Reclast in the past  . Tricuspid valve disorders, specified as nonrheumatic      Patient Active Problem List   Diagnosis Date Noted  . Greater trochanteric bursitis of right hip 09/29/2015  . Asymptomatic cholelithiasis 05/09/2015  . Essential hypertension 11/07/2014  . Hair loss 11/07/2014  . Bilateral leg edema 11/07/2014  . Constipation due to pain medication 11/06/2014  . Hypotension due to drugs 11/06/2014  . Chest pain 09/01/2014  . Osteoporosis 01/04/2014  . Gout 01/04/2014  . Headache 01/04/2014  . Hypothyroidism 11/19/2013  . Hypercalcemia 10/11/2013  . Insomnia 05/16/2013  . Lumbar back pain 05/16/2013  . Polycythemia vera ([place])   . Neck mass   . Senile osteoporosis   . Macular degeneration   . Sick sinus syndrome ([place]) 03/09/2012  . CAD (coronary artery disease) 12/27/2011  . Paroxysmal atrial fibrillation ([place]) 12/27/2011  . Chronic diastolic CHF (congestive heart failure) ([place]) 12/27/2011  . HTN (hypertension) 12/27/2011    Past Surgical History:  Procedure Laterality Date  . ABLATION  08/28/14   Back   . BREAST BIOPSY  06/18/1998   left  . BREAST BIOPSY    . TONSILLECTOMY    . TOTAL ABDOMINAL HYSTERECTOMY    . TYMPANOPLASTY Bilateral     OB History    No data available       Home Medications    Prior to Admission medications  Medication Sig Start Date End Date Taking? Authorizing Provider  acetaminophen (TYLENOL) 650 MG CR tablet Take 650 mg by mouth 3 (three) times daily.    Historical Provider, MD  amiodarone (PACERONE) 200 MG tablet TAKE 1 TABLET ONCE DAILY. 09/15/15   Larey Dresser, MD  amoxicillin-clavulanate (AUGMENTIN) 875-125 MG tablet Take 1 tablet by mouth 2 (two) times daily. 09/29/15   Tiffany L Reed, DO  cholecalciferol (VITAMIN D) 1000 UNITS tablet Take 2,000 Units by mouth daily.     Historical Provider, MD  fentaNYL (DURAGESIC - DOSED MCG/HR) 25 MCG/HR patch Place 1 patch (25 mcg total) onto the skin every 3 (three) days. 08/28/15   Tiffany L Reed, DO  furosemide (LASIX) 40 MG tablet Take 2 tablets (80mg ) by  mouth in the AM and 1.5 tablets (60mg ) by mouth in the PM 07/18/15   Larey Dresser, MD  gabapentin (NEURONTIN) 100 MG capsule Take 100 mg by mouth daily.    Historical Provider, MD  guaiFENesin (MUCINEX) 600 MG 12 hr tablet Take 1 tablet (600 mg total) by mouth 2 (two) times daily. 09/29/15   Tiffany L Reed, DO  hydrALAZINE (APRESOLINE) 50 MG tablet TAKE 1 1/2 TABLETS THREE TIMES DAILY. 09/15/15   Larey Dresser, MD  hydroxyurea (HYDREA) 500 MG capsule TAKE 2 CAPSULES ON MONDAYS AND TAKE 1 CAPSULE ALL OTHER DAYS OF THE WEEK. 08/30/15   Heath Lark, MD  levothyroxine (SYNTHROID, LEVOTHROID) 112 MCG tablet TAKE 1 TABLET EACH DAY. 10/03/15   Tiffany L Reed, DO  Magnesium 250 MG TABS Take 250 mg by mouth daily. Reported on 06/19/2015    Historical Provider, MD  Melatonin 5 MG CAPS Take 5 mg by mouth at bedtime. Reported on 06/19/2015    Historical Provider, MD  Multiple Vitamins-Minerals (PRESERVISION AREDS 2) CAPS Take 1 capsule by mouth 2 (two) times daily. Reported on 06/19/2015    Historical Provider, MD  senna-docusate (SENOKOT-S) 8.6-50 MG per tablet Take 2 tablets by mouth 2 (two) times daily. 08/07/14   Tiffany L Reed, DO  traMADol (ULTRAM) 50 MG tablet TAKE 1 OR 2 TABLETS EVERY 6 HOURS AS NEEDED FOR PAIN 08/26/15   Lauree Chandler, NP  XARELTO 15 MG TABS tablet TAKE 1 TABLET ONCE DAILY. 09/01/15   Larey Dresser, MD    Family History Family History  Problem Relation Age of Onset  . Hypertension Father   . Heart disease Father     patient does not know details.   . Ovarian cancer Mother 57  . Cancer Sister     colon  . Heart disease Sister   . Cancer Sister   . Cancer Sister     hodgkin disease  . Breast cancer Daughter   . Cancer Daughter     breast    Social History Social History  Substance Use Topics  . Smoking status: Former Smoker    Types: Cigarettes    Quit date: 01/19/1984  . Smokeless tobacco: Never Used  . Alcohol use 0.6 oz/week    1 Glasses of wine per week      Comment: 2 per day/ 14 a week     Allergies   Amlodipine   Review of Systems Review of Systems  All other systems reviewed and are negative.    Physical Exam Updated Vital Signs BP 163/82 (BP Location: Left Arm)   Pulse 73   Temp 98.9 F (37.2 C) (Oral)   Resp 18   SpO2 94%  Physical Exam  Constitutional: She is oriented to person, place, and time. She appears well-developed and well-nourished.  Non-toxic appearance. No distress.  HENT:  Head: Normocephalic and atraumatic.  Eyes: Conjunctivae, EOM and lids are normal. Pupils are equal, round, and reactive to light.  Neck: Normal range of motion. Neck supple. No tracheal deviation present. No thyroid mass present.  Cardiovascular: Normal rate, regular rhythm and normal heart sounds.  Exam reveals no gallop.   No murmur heard. Pulmonary/Chest: Effort normal and breath sounds normal. No stridor. No respiratory distress. She has no decreased breath sounds. She has no wheezes. She has no rhonchi. She has no rales.  Abdominal: Soft. Normal appearance and bowel sounds are normal. She exhibits no distension. There is no tenderness. There is no rebound and no CVA tenderness.  Musculoskeletal: Normal range of motion. She exhibits no edema.       Right hip: She exhibits tenderness. She exhibits normal strength and no deformity.       Legs: Neurological: She is alert and oriented to person, place, and time. She has normal strength. No cranial nerve deficit or sensory deficit. GCS eye subscore is 4. GCS verbal subscore is 5. GCS motor subscore is 6.  Skin: Skin is warm and dry. No abrasion and no rash noted.  Psychiatric: She has a normal mood and affect. Her speech is normal and behavior is normal.  Nursing note and vitals reviewed.    ED Treatments / Results  Labs (all labs ordered are listed, but only abnormal results are displayed) Labs Reviewed - No data to display  EKG  EKG Interpretation None       Radiology No  results found.  Procedures Procedures (including critical care time)  Medications Ordered in ED Medications  LORazepam (ATIVAN) injection 0.5 mg (not administered)  dexamethasone (DECADRON) injection 10 mg (not administered)  morphine 2 MG/ML injection 2 mg (not administered)     Initial Impression / Assessment and Plan / ED Course  I have reviewed the triage vital signs and the nursing notes.  Pertinent labs & imaging results that were available during my care of the patient were reviewed by me and considered in my medical decision making (see chart for details).  Clinical Course    Patient medicated and feels better. Will be admitted for treatment of her ostial mellitus  Final Clinical Impressions(s) / ED Diagnoses   Final diagnoses:  None    New Prescriptions New Prescriptions   No medications on file     Lacretia Leigh, MD 10/05/15 2234

## 2015-10-05 NOTE — H&P (Signed)
History and Physical  Sarah Mays [id number] DOB: May 12, 1930 DOA: 10/05/2015  PCP:  Hollace Kinnier, DO   Chief Complaint:  R.hip pain   History of Present Illness:  Patient is a 82 yo female with history of CAD, diastolic HF, HTN, hypothyroidism, Afib who came with cc of right hip pain that is sudden after she kicked her right leg with her left foot trying to take off her shoes. She has had a dull constant pain in her right thigh/hip for the past couple of weeks that was diagnosed as bursitis as she had that last year and for which she had a corticosteroid injection by her PCP at that time and it helped her. Last week, she had another injection and it did not help her much. She denied any other trauma to her hip/thigh. No open wounds or ulcers. No fever or chills. She had sinusitis for 10 days but she just started antibiotics ( Augmentin) a few days ago. She no longer has cough but feels a little nasal congestion. No chest pain or dyspnea. No N/V/D/C/abd pain/dysuria.   Review of Systems:  CONSTITUTIONAL:     No night sweats.  No fatigue.  No fever. No chills. Eyes:                            No visual changes.  No eye pain.  No eye discharge.   ENT:                              No epistaxis.  No sinus pain.  No sore throat.   +congestion. RESPIRATORY:           No cough.  No wheeze.  No hemoptysis.  No dyspnea CARDIOVASCULAR   :  No chest pains.  No palpitations. GASTROINTESTINAL:  No abdominal pain.  No nausea. No vomiting.  No diarrhea. No constipation.  No hematemesis.  No hematochezia.  No melena. GENITOURINARY:      No urgency.  No frequency.  No dysuria.  No hematuria.  No  obstructive symptoms.  No discharge.  No pain.  MUSCULOSKELETAL:  +musculoskeletal pain.  No joint swelling.  No arthritis. NEUROLOGICAL:        No confusion.  No weakness. No headache. No seizure. PSYCHIATRIC:             No depression. No anxiety. No suicidal ideation. SKIN:                              No rashes.  No lesions.  No wounds. ENDOCRINE:                No weight loss.  No polydipsia.  No polyuria.  No polyphagia. HEMATOLOGIC:           No purpura.  No petechiae.  No bleeding.  ALLERGIC                 : No pruritus.  No angioedema Other:  Past Medical and Surgical History:   Past Medical History:  Diagnosis Date  . Abnormal stress test    a. 11/2011 Ex MV: EF 80%, small, partially reversible anteroapical defect->mild ischemia vs attenuation-->med Rx.  [name] Kitchen Alopecia, unspecified   . Backache, unspecified   . Chronic diastolic CHF (congestive heart failure) ([place])    a. 11/2013 Echo: EF 60-65%,  no rwma, mild TR, PASP 35mmHg.  . Closed fracture of lower end of radius with ulna   . Deafness    Left, s/p multiple surgeries  . Edema   . Essential hypertension   . Gouty arthropathy, unspecified   . Headache(784.0)   . Hypercalcemia   . Hyperpotassemia   . Hyposmolality and/or hyponatremia   . Hypothyroidism   . Insomnia, unspecified   . Macular degeneration    Left, s/p inj. tx  . Mitral valve disorders   . Neck mass    right, w/u including MRI negative  . Other malaise and fatigue   . PAF (paroxysmal atrial fibrillation) ([facility])    a. on amio (02/2012 mild obstruction on PFT's) and xarelto.  . Polycythemia vera(238.4)   . Pure hypercholesterolemia   . Senile osteoporosis    Reclast in the past  . Tricuspid valve disorders, specified as nonrheumatic    Past Surgical History:  Procedure Laterality Date  . ABLATION  08/28/14   Back   . BREAST BIOPSY  06/18/1998   left  . BREAST BIOPSY    . TONSILLECTOMY    . TOTAL ABDOMINAL HYSTERECTOMY    . TYMPANOPLASTY Bilateral     Social History:   reports that she quit smoking about 31 years ago. Her smoking use included Cigarettes. She has never used smokeless tobacco. She reports that she drinks about 0.6 oz of alcohol per week . She reports that she does not use drugs.    Allergies  Allergen Reactions  .  Amlodipine Swelling and Other (See Comments)    Reaction:  Lower extremity swelling     Family History  Problem Relation Age of Onset  . Hypertension Father   . Heart disease Father     patient does not know details.   . Ovarian cancer Mother 66  . Cancer Sister     colon  . Heart disease Sister   . Cancer Sister   . Cancer Sister     hodgkin disease  . Breast cancer Daughter   . Cancer Daughter     breast      Prior to Admission medications   Medication Sig Start Date End Date Taking? Authorizing Provider  acetaminophen (TYLENOL) 650 MG CR tablet Take 650 mg by mouth 3 (three) times daily.   Yes Historical Provider, MD  amiodarone (PACERONE) 200 MG tablet Take 200 mg by mouth daily.   Yes Historical Provider, MD  amoxicillin-clavulanate (AUGMENTIN) 875-125 MG tablet Take 1 tablet by mouth 2 (two) times daily. 09/29/15  Yes Tiffany L Reed, DO  cholecalciferol (VITAMIN D) 1000 UNITS tablet Take 2,000 Units by mouth daily.    Yes Historical Provider, MD  fentaNYL (DURAGESIC - DOSED MCG/HR) 25 MCG/HR patch Place 1 patch (25 mcg total) onto the skin every 3 (three) days. 08/28/15  Yes Tiffany L Reed, DO  furosemide (LASIX) 40 MG tablet Take 60 mg by mouth at bedtime.   Yes Historical Provider, MD  furosemide (LASIX) 80 MG tablet Take 80 mg by mouth every morning.   Yes Historical Provider, MD  gabapentin (NEURONTIN) 100 MG capsule Take 100 mg by mouth at bedtime.    Yes Historical Provider, MD  guaiFENesin (MUCINEX) 600 MG 12 hr tablet Take 600 mg by mouth 2 (two) times daily as needed for cough or to loosen phlegm.   Yes Historical Provider, MD  hydrALAZINE (APRESOLINE) 50 MG tablet Take 50 mg by mouth 3 (three) times daily.   Yes Historical  Provider, MD  hydroxyurea (HYDREA) 500 MG capsule Take 500 mg by mouth daily. May take with food to minimize GI side effects.   Yes Historical Provider, MD  levothyroxine (SYNTHROID, LEVOTHROID) 112 MCG tablet Take 112 mcg by mouth daily before  breakfast.   Yes Historical Provider, MD  Magnesium 250 MG TABS Take 250 mg by mouth daily.    Yes Historical Provider, MD  Melatonin 5 MG CAPS Take 5 mg by mouth at bedtime.    Yes Historical Provider, MD  Multiple Vitamins-Minerals (PRESERVISION AREDS 2) CAPS Take 1 capsule by mouth 2 (two) times daily.    Yes Historical Provider, MD  Rivaroxaban (XARELTO) 15 MG TABS tablet Take 15 mg by mouth daily with supper.   Yes Historical Provider, MD  senna-docusate (SENOKOT-S) 8.6-50 MG per tablet Take 2 tablets by mouth 2 (two) times daily. 08/07/14  Yes Tiffany L Reed, DO  traMADol (ULTRAM) 50 MG tablet Take 50-100 mg by mouth every 6 (six) hours as needed for moderate pain.   Yes Historical Provider, MD    Physical Exam: BP 150/77 (BP Location: Left Arm)   Pulse 65   Temp 98.9 F (37.2 C) (Oral)   Resp 14   Ht 5' (1.524 m)   Wt 52.6 kg (116 lb)   SpO2 96%   BMI 22.65 kg/m   GENERAL :   Alert and cooperative, and appears to be in no acute distress. HEAD:           normocephalic. EYES:            PERRL, EOMI.  vision is grossly intact. EARS:           hearing grossly intact.  THROAT:     Oral cavity and pharynx normal.   NECK:          supple, non-tender. No lymphadenopathy CARDIAC:    Normal S1 and S2. No gallop. No murmurs.  Vascular:     no peripheral edema.  LUNGS:       Clear to auscultation  ABDOMEN: Positive bowel sounds. Soft, nondistended, nontender. No guarding or rebound.      MSK:           No joint erythema or tenderness. Normal muscular development. EXT           : No significant deformity or joint abnormality. Pain on right hip flexion > 40 but no pain on lateral/medial rotation. Pain on deep palpation of right hip.  Neuro        : Alert, oriented to person, place, and time. SKIN:            No rash. No lesions. PSYCH:       No hallucination. Patient is not suicidal.          Labs on Admission:  Reviewed.   Radiological Exams on Admission: Mr Hip Right W Wo  Contrast  Result Date: 10/05/2015 CLINICAL DATA:  Right hip pain. EXAM: MRI OF THE RIGHT HIP WITHOUT AND WITH CONTRAST TECHNIQUE: Multiplanar, multisequence MR imaging was performed both before and after administration of intravenous contrast. CONTRAST:  79mL MULTIHANCE GADOBENATE DIMEGLUMINE 529 MG/ML IV SOLN COMPARISON:  Right hip radiographs 10/05/2015 FINDINGS: Bones: There is an area of abnormal increased T2 signal intensity demonstrated marrow and cortical portion of the proximal right femoral shaft bowel below the trochanteric region. This corresponds with a focal abnormality on recent plain radiographs. There is mild T2 signal abnormality also in the soft tissues surrounding this  area. Appearance is most suggestive of the area of osteomyelitis with a focal sinus tract. Nondisplaced fracture or infiltrating neoplasm felt less likely. There is no expansile change. Except for the focal cortical abnormality, the bone cortex is otherwise intact. No endosteal scalloping. Diffuse heterogeneous appearance to the marrow throughout the visualized pelvis and bilateral hips likely representing asymmetric fatty marrow replacement or anemia. Articular cartilage and labrum Articular cartilage: Limited visualization due to motion artifact. Appears grossly intact. Labrum:  Limited visualization.  Appears grossly intact. Joint or bursal effusion Joint effusion:  Small right hip effusion. Bursae:  No bursal collections. Muscles and tendons Muscles and tendons:  No acute abnormality. Other findings Miscellaneous:  None. IMPRESSION: Abnormal marrow signal intensity demonstrated in the proximal right femoral shaft with small focal cortical defect and surrounding soft tissue infiltration. Appearance most likely to represent osteomyelitis with sinus tract. Electronically Signed   By: Lucienne Capers M.D.   On: 10/05/2015 21:38   Dg Hip Unilat W Or Wo Pelvis 2-3 Views Right  Result Date: 10/05/2015 CLINICAL DATA:  Sharp pain  in the right hip. EXAM: DG HIP (WITH OR WITHOUT PELVIS) 2-3V RIGHT COMPARISON:  None. FINDINGS: There is no evidence of hip fracture or dislocation. There are advanced osteoarthritic changes of the right hip and moderate osteoarthritic changes of the left hip. Cortical irregularity with sclerotic margins of the lateral aspect of the proximal right femur may represent a healed fracture. Soft tissues are grossly normal. IMPRESSION: Advanced osteoarthritic changes of the right hip and moderate osteoarthritic changes of the left hip. Cortical irregularity of the proximal right femur may represent a subacute fracture. Electronically Signed   By: Fidela Salisbury M.D.   On: 10/05/2015 17:48     Assessment/Plan  Osteomyelitis: Acute vs. Subacute ( with sinus tract in MRI) Bcx ordered Started on Van/zosyn awaiting Bcx or bone bx.  Sent for CRP/Sed rate Pain : tramadol prn.  Likely hematogenous seeding. ID consult in am  Paroxysmal afib/A flutter: CHADVASCS >2 On Xarelto Continue amiodarone   Diastolic HF: Echo in 99991111 with 50% EF Hold lasix while watching I/O inpatient   HTN: continue home meds: hydralazine with holding parameters   Hypothyroidism: cont synthroid   Input & Output: ordered  Lines & Tubes: PIV DVT prophylaxis: on Xarelto GI prophylaxis: NA Consultants: ID Code Status: DNR Family Communication: None at bedsdie  Disposition Plan: Admit    Gennaro Africa M.D [facility]

## 2015-10-05 NOTE — ED Notes (Signed)
Pt still in MRI at this time

## 2015-10-05 NOTE — ED Notes (Signed)
Bed: [id number] Expected date:  Expected time:  Means of arrival:  Comments: EMS-hip fracture

## 2015-10-05 NOTE — ED Notes (Signed)
Pt in MRI at this time 

## 2015-10-05 NOTE — ED Notes (Signed)
Blood draw delayed.  Pt currently at MRI

## 2015-10-05 NOTE — Progress Notes (Signed)
Pharmacy Antibiotic Note  Sarah Mays is a 82 y.o. female admitted on 10/05/2015 with osteomyelitis of the hip.   Pharmacy has been consulted for Vancomycin & Zosyn dosing.  Plan for ID consult in the AM.  Plan: Vancomycin 750mg  IV every 24 hours.  Goal trough 15-20 mcg/mL. Zosyn 3.375g IV q8h (4 hour infusion).  Height: 5' (152.4 cm) Weight: 116 lb (52.6 kg) IBW/kg (Calculated) : 45.5  Temp (24hrs), Avg:98.9 F (37.2 C), Min:98.9 F (37.2 C), Max:98.9 F (37.2 C)   Recent Labs Lab 10/05/15 1823  WBC 12.7*  CREATININE 0.80    Estimated Creatinine Clearance: 38.3 mL/min (by C-G formula based on SCr of 0.8 mg/dL).    Allergies  Allergen Reactions  . Amlodipine Swelling and Other (See Comments)    Reaction:  Lower extremity swelling     Antimicrobials this admission: 8/28 Vanc >>   8/28 Zosyn >>    Dose adjustments this admission:    Microbiology results:  Thank you for allowing pharmacy to be a part of this patient's care.  Everette Rank, PharmD 10/05/2015 11:51 PM

## 2015-10-05 NOTE — ED Notes (Signed)
Pt in MRI.

## 2015-10-05 NOTE — ED Triage Notes (Signed)
Pt with hx of bursitis R hip and back pain, patient attempted to kick shoe off and hit leg on bed. Pt c/o pain to R hip. Pt given 153mcg Fentanyl by EMS, patient has Fentanyl patch on currently for back pain.

## 2015-10-06 DIAGNOSIS — H65191 Other acute nonsuppurative otitis media, right ear: Secondary | ICD-10-CM

## 2015-10-06 DIAGNOSIS — M818 Other osteoporosis without current pathological fracture: Secondary | ICD-10-CM

## 2015-10-06 DIAGNOSIS — D72829 Elevated white blood cell count, unspecified: Secondary | ICD-10-CM

## 2015-10-06 LAB — COMPREHENSIVE METABOLIC PANEL
ALBUMIN: 4.3 g/dL (ref 3.5–5.0)
ALT: 18 U/L (ref 14–54)
AST: 18 U/L (ref 15–41)
Alkaline Phosphatase: 49 U/L (ref 38–126)
Anion gap: 8 (ref 5–15)
BUN: 32 mg/dL — ABNORMAL HIGH (ref 6–20)
CHLORIDE: 101 mmol/L (ref 101–111)
CO2: 33 mmol/L — AB (ref 22–32)
CREATININE: 0.88 mg/dL (ref 0.44–1.00)
Calcium: 9.9 mg/dL (ref 8.9–10.3)
GFR calc non Af Amer: 59 mL/min — ABNORMAL LOW (ref >60)
GLUCOSE: 114 mg/dL — AB (ref 65–99)
Potassium: 3.8 mmol/L (ref 3.5–5.1)
SODIUM: 142 mmol/L (ref 135–145)
Total Bilirubin: 0.6 mg/dL (ref 0.3–1.2)
Total Protein: 6.5 g/dL (ref 6.5–8.1)

## 2015-10-06 LAB — CBC
HCT: 42.6 % (ref 36.0–46.0)
HEMOGLOBIN: 14 g/dL (ref 12.0–15.0)
MCH: 37.8 pg — AB (ref 26.0–34.0)
MCHC: 32.9 g/dL (ref 30.0–36.0)
MCV: 115.1 fL — AB (ref 78.0–100.0)
PLATELETS: 524 10*3/uL — AB (ref 150–400)
RBC: 3.7 MIL/uL — AB (ref 3.87–5.11)
RDW: 15.6 % — ABNORMAL HIGH (ref 11.5–15.5)
WBC: 12.2 10*3/uL — ABNORMAL HIGH (ref 4.0–10.5)

## 2015-10-06 LAB — SEDIMENTATION RATE: SED RATE: 0 mm/h (ref 0–22)

## 2015-10-06 LAB — C-REACTIVE PROTEIN

## 2015-10-06 MED ORDER — CALCIUM POLYCARBOPHIL 625 MG PO TABS
1250.0000 mg | ORAL_TABLET | Freq: Two times a day (BID) | ORAL | Status: DC
  Administered 2015-10-06 – 2015-10-10 (×7): 1250 mg via ORAL
  Filled 2015-10-06 (×8): qty 2

## 2015-10-06 NOTE — Consult Note (Signed)
Reason for Consult: Right hip, proximal thigh pain, evaluate stress fracture Referring Physician: Marthenia Rolling, MD and Sarah Flattery, MD  Sarah Mays is an 82 y.o. female.  HPI: Patient is a 83 yo female with history of CAD, diastolic HF, HTN, hypothyroidism, Afib who came with cc of right hip pain that is sudden after she kicked her right leg with her left foot trying to take off her shoes. She has had a dull constant pain in her right thigh/hip for the past couple of weeks that was diagnosed as bursitis as she had that last year and for which she had a corticosteroid injection by her PCP at that time and it helped her. Last week, she had another injection and it did not help her much. She denied any other trauma to her hip/thigh. No open wounds or ulcers. No fever or chills. She had sinusitis for 10 days but she just started antibiotics ( Augmentin) a few days ago. She no longer has cough but feels a little nasal congestion. No chest pain or dyspnea. No N/V/D/C/abd pain/dysuria.   Hospitalist service sought consult from Infectious Disease to determine if findings radiographically could be associated with osteomyelitis.  Upon review of her studies with radiology more specifically and correlating with exam finding there was felt to be no clinical concern for infection but instead concern for stress fracture involving lateral proximal femoral shaft.  Dr. Baxter Mays subsequently contacted me to evaluate further  Past Medical History:  Diagnosis Date  . Abnormal stress test    a. 11/2011 Ex MV: EF 80%, small, partially reversible anteroapical defect->mild ischemia vs attenuation-->med Rx.  [name] Kitchen Alopecia, unspecified   . Backache, unspecified   . Chronic diastolic CHF (congestive heart failure) ([place])    a. 11/2013 Echo: EF 60-65%, no rwma, mild TR, PASP 47mmHg.  . Closed fracture of lower end of radius with ulna   . Deafness    Left, s/p multiple surgeries  . Edema   . Essential hypertension   . Gouty  arthropathy, unspecified   . Headache(784.0)   . Hypercalcemia   . Hyperpotassemia   . Hyposmolality and/or hyponatremia   . Hypothyroidism   . Insomnia, unspecified   . Macular degeneration    Left, s/p inj. tx  . Mitral valve disorders   . Neck mass    right, w/u including MRI negative  . Other malaise and fatigue   . PAF (paroxysmal atrial fibrillation) (HCC)    a. on amio (02/2012 mild obstruction on PFT's) and xarelto.  . Polycythemia vera(238.4)   . Pure hypercholesterolemia   . Senile osteoporosis    Reclast in the past  . Tricuspid valve disorders, specified as nonrheumatic     Past Surgical History:  Procedure Laterality Date  . ABLATION  08/28/14   Back   . BREAST BIOPSY  06/18/1998   left  . BREAST BIOPSY    . TONSILLECTOMY    . TOTAL ABDOMINAL HYSTERECTOMY    . TYMPANOPLASTY Bilateral     Family History  Problem Relation Age of Onset  . Hypertension Father   . Heart disease Father     patient does not know details.   . Ovarian cancer Mother 66  . Cancer Sister     colon  . Heart disease Sister   . Cancer Sister   . Cancer Sister     hodgkin disease  . Breast cancer Daughter   . Cancer Daughter     breast    Social History:  reports that she quit smoking about 31 years ago. Her smoking use included Cigarettes. She has never used smokeless tobacco. She reports that she drinks about 0.6 oz of alcohol per week . She reports that she does not use drugs.  Allergies:  Allergies  Allergen Reactions  . Amlodipine Swelling and Other (See Comments)    Reaction:  Lower extremity swelling     Medications:  I have reviewed the patient's current medications. Scheduled: . amiodarone  200 mg Oral Daily  . [START ON 10/08/2015] fentaNYL  25 mcg Transdermal Q72H  . furosemide  60 mg Oral Daily  . furosemide  80 mg Oral q morning - 10a  . gabapentin  100 mg Oral QHS  . hydrALAZINE  50 mg Oral TID  . hydroxyurea  500 mg Oral Daily  . levothyroxine  112 mcg  Oral QAC breakfast  . magnesium oxide  200 mg Oral Daily  . polycarbophil  1,250 mg Oral BID  . potassium chloride  10 mEq Oral BID  . senna-docusate  2 tablet Oral BID    Results for orders placed or performed during the hospital encounter of 10/05/15 (from the past 24 hour(s))  C-reactive protein     Status: None   Collection Time: 10/06/15 12:23 AM  Result Value Ref Range   CRP <0.5 <1.0 mg/dL  Comprehensive metabolic panel     Status: Abnormal   Collection Time: 10/06/15  4:08 AM  Result Value Ref Range   Sodium 142 135 - 145 mmol/L   Potassium 3.8 3.5 - 5.1 mmol/L   Chloride 101 101 - 111 mmol/L   CO2 33 (H) 22 - 32 mmol/L   Glucose, Bld 114 (H) 65 - 99 mg/dL   BUN 32 (H) 6 - 20 mg/dL   Creatinine, Ser 0.88 0.44 - 1.00 mg/dL   Calcium 9.9 8.9 - 10.3 mg/dL   Total Protein 6.5 6.5 - 8.1 g/dL   Albumin 4.3 3.5 - 5.0 g/dL   AST 18 15 - 41 U/L   ALT 18 14 - 54 U/L   Alkaline Phosphatase 49 38 - 126 U/L   Total Bilirubin 0.6 0.3 - 1.2 mg/dL   GFR calc non Af Amer 59 (L) >60 mL/min   GFR calc Af Amer >60 >60 mL/min   Anion gap 8 5 - 15  CBC     Status: Abnormal   Collection Time: 10/06/15  4:08 AM  Result Value Ref Range   WBC 12.2 (H) 4.0 - 10.5 K/uL   RBC 3.70 (L) 3.87 - 5.11 MIL/uL   Hemoglobin 14.0 12.0 - 15.0 g/dL   HCT 42.6 36.0 - 46.0 %   MCV 115.1 (H) 78.0 - 100.0 fL   MCH 37.8 (H) 26.0 - 34.0 pg   MCHC 32.9 30.0 - 36.0 g/dL   RDW 15.6 (H) 11.5 - 15.5 %   Platelets 524 (H) 150 - 400 K/uL    X-ray:  CLINICAL DATA:  Sharp pain in the right hip.  EXAM: DG HIP (WITH OR WITHOUT PELVIS) 2-3V RIGHT  COMPARISON:  None.  FINDINGS: There is no evidence of hip fracture or dislocation. There are advanced osteoarthritic changes of the right hip and moderate osteoarthritic changes of the left hip. Cortical irregularity with sclerotic margins of the lateral aspect of the proximal right femur may represent a healed fracture. Soft tissues are grossly  normal.  IMPRESSION: Advanced osteoarthritic changes of the right hip and moderate osteoarthritic changes of the left hip.  Cortical  irregularity of the proximal right femur may represent a subacute fracture.  ADDENDUM: The original report was by Dr. Viona Gilmore. Leonidas Romberg. The following addendum is by Dr. Van Clines:  I was phoned by Dr. Baxter Mays in Infectious Disease at about 4:15 p.m. on 10/06/2015 to review this case which was interpreted last night on-call. I discussed the case by phone with Dr. Baxter Mays as we reviewed the images.  I observe focal cortical thickening in the proximal femoral diaphysis laterally in a pattern consistent with a stress fracture, with surrounding periostitis and adjacent marrow edema. I do not see a sinus tract, and I suspect the concern for sinus tract initially raised may have been based on assessment of an adjacent vessel as being a sinus tract, but no sinus tract is retrospectively visible and Dr. Baxter Mays tells me that there is no draining sinus tract to the skin on physical exam inspection.  I do note that this somewhat atypical location for femoral stress fracture has been associated in the literature with the use of bisphosphonates. Correlate with patient medication history.  Note is also made of a herniation of bowel through the inferolateral margin of the right lateral abdominal wall musculature, shown on image 31/6. There is degenerative arthropathy of both hips and degenerative disc disease in the lower lumbar spine.   Electronically Signed   By: Van Clines M.D.  ROS  Positive for that related in her admitting H&P, right hip girdle pain Review of Systems:  CONSTITUTIONAL:     No night sweats.  No fatigue.  No fever. No chills. Eyes:                            No visual changes.  No eye pain.  No eye discharge.   ENT:                              No epistaxis.  No sinus pain.  No sore throat.    +congestion. RESPIRATORY:           No cough.  No wheeze.  No hemoptysis.  No dyspnea CARDIOVASCULAR   :  No chest pains.  No palpitations. GASTROINTESTINAL:  No abdominal pain.  No nausea. No vomiting.  No diarrhea. No constipation.  No hematemesis.  No hematochezia.  No melena. GENITOURINARY:      No urgency.  No frequency.  No dysuria.  No hematuria.  No  obstructive symptoms.  No discharge.  No pain.  MUSCULOSKELETAL:  +musculoskeletal pain.  No joint swelling.  No arthritis. NEUROLOGICAL:        No confusion.  No weakness. No headache. No seizure. PSYCHIATRIC:             No depression. No anxiety. No suicidal ideation. SKIN:                             No rashes.  No lesions.  No wounds. ENDOCRINE:                No weight loss.  No polydipsia.  No polyuria.  No polyphagia. HEMATOLOGIC:           No purpura.  No petechiae.  No bleeding.  ALLERGIC                 : No pruritus.  No angioedema  Blood  pressure (!) 155/67, pulse 65, temperature 98.8 F (37.1 C), temperature source Oral, resp. rate 14, height 5' (1.524 m), weight 52.6 kg (116 lb), SpO2 92 %.  Physical Exam  Awake alert oriented Just finishing getting up to bedside commode Pain to palpation and with motion right proximal thigh No wounds, cuts or lacerations right thigh General medical exam reviewed for pertinent findings  Assessment/Plan: Atypical right proximal third lateral femoral stress fracture, impending fracture risk Received Xarelto dose last night, will hold Xarelto today and plan to go to OR tomorrow evening as an add on NPO after MN o/w regular diet now  Post op will be WBAT  Will resume Xarelto Disposition pending PT eval    Laurajean Hosek D 10/06/2015, 9:02 PM

## 2015-10-06 NOTE — Consult Note (Signed)
[facility] for Infectious Disease  Total days of antibiotics 2        Day 2 piptazo        Day 1 vanco               Reason for Consult: osteomyelitis    Referring Physician: ogbata  Active Problems:   Osteomyelitis ([place])    HPI: Sarah Mays is a 82 y.o. female with history of chronic diastolic CHF, polycythemia vera, severe osteoporosis, Afib,hx of lumbar epidural steroid injection in 2016 and hypothyroidism who was admitted for acute onset of right hip pain that came on by kicking her right leg attempting to pull off shoes. She is unable to place weight on her right leg without extreme pain. She generally uses a walker to help with lower back pain. Her daughter reports that she had started to have right hip pain dating back in January 2017. She has had 3 steroid injections to relieve bursitis. Her last injection was last week, but had not responded as well as she had in the previous 2 injections.   She had recently been started on amox/clav for sinusitis/ otitis media, 4 days prior to admit. She denies fever, and only admits to pain.  MRI suggestive of stress fracture vs. Early osteo with sinus tract. Her inflammatory markers are normal. On admit, her WBC, mildly elevated with wbc of 12.7 with 86% N (baseline of 10.5)  Xray suggests subacute fracture. MRI suggest osteomyelitis with sinus tract, though it doesn't comment where the tract  Begins or ends. Physical exam does not show any cutaneous fistula.I spoke with Dr. Janae Sauce, from radiology who is unable to visualize sinus tract that was originally reported, reviewing with [facility] feel more consistent with stress fracture.  Past Medical History:  Diagnosis Date  . Abnormal stress test    a. 11/2011 Ex MV: EF 80%, small, partially reversible anteroapical defect->mild ischemia vs attenuation-->med Rx.  [name] Kitchen Alopecia, unspecified   . Backache, unspecified   . Chronic diastolic CHF (congestive heart failure) ([place])    a. 11/2013  Echo: EF 60-65%, no rwma, mild TR, PASP 58mHg.  . Closed fracture of lower end of radius with ulna   . Deafness    Left, s/p multiple surgeries  . Edema   . Essential hypertension   . Gouty arthropathy, unspecified   . Headache(784.0)   . Hypercalcemia   . Hyperpotassemia   . Hyposmolality and/or hyponatremia   . Hypothyroidism   . Insomnia, unspecified   . Macular degeneration    Left, s/p inj. tx  . Mitral valve disorders   . Neck mass    right, w/u including MRI negative  . Other malaise and fatigue   . PAF (paroxysmal atrial fibrillation) ([facility])    a. on amio (02/2012 mild obstruction on PFT's) and xarelto.  . Polycythemia vera(238.4)   . Pure hypercholesterolemia   . Senile osteoporosis    Reclast in the past  . Tricuspid valve disorders, specified as nonrheumatic     Allergies:  Allergies  Allergen Reactions  . Amlodipine Swelling and Other (See Comments)    Reaction:  Lower extremity swelling     MEDICATIONS: . amiodarone  200 mg Oral Daily  . [START ON 10/08/2015] fentaNYL  25 mcg Transdermal Q72H  . hydrALAZINE  50 mg Oral TID  . levothyroxine  112 mcg Oral QAC breakfast  . magnesium oxide  200 mg Oral Daily  . Rivaroxaban  15 mg Oral Q  supper  . senna-docusate  2 tablet Oral BID    Social History  Substance Use Topics  . Smoking status: Former Smoker    Types: Cigarettes    Quit date: 01/19/1984  . Smokeless tobacco: Never Used  . Alcohol use 0.6 oz/week    1 Glasses of wine per week     Comment: 2 per day/ 14 a week    Family History  Problem Relation Age of Onset  . Hypertension Father   . Heart disease Father     patient does not know details.   . Ovarian cancer Mother 49  . Cancer Sister     colon  . Heart disease Sister   . Cancer Sister   . Cancer Sister     hodgkin disease  . Breast cancer Daughter   . Cancer Daughter     breast    Review of Systems  Constitutional: Negative for fever, chills, diaphoresis, activity change,  appetite change, fatigue and unexpected weight change.  HENT: left ear fullness Eyes: Negative for photophobia and visual disturbance.  Respiratory: Negative for cough, chest tightness, shortness of breath, wheezing and stridor.  Cardiovascular: Negative for chest pain, palpitations and leg swelling.  Gastrointestinal: Negative for nausea, vomiting, abdominal pain, diarrhea, constipation, blood in stool, abdominal distention and anal bleeding.  Genitourinary: Negative for dysuria, hematuria, flank pain and difficulty urinating.  Musculoskeletal: limited range of motion with right leg due to pain Skin: Negative for color change, pallor, rash and wound.  Neurological: Negative for dizziness, tremors, weakness and light-headedness.  Hematological: Negative for adenopathy. Does not bruise/bleed easily.  Psychiatric/Behavioral: Negative for behavioral problems, confusion, sleep disturbance, dysphoric mood, decreased concentration and agitation.     OBJECTIVE: Temp:  [98.1 F (36.7 C)-98.9 F (37.2 C)] 98.8 F (37.1 C) (08/28 1412) Pulse Rate:  [46-73] 65 (08/28 1412) Resp:  [14-18] 14 (08/28 1412) BP: (128-163)/(59-82) 155/67 (08/28 1412) SpO2:  [90 %-98 %] 92 % (08/28 1412) Weight:  [116 lb (52.6 kg)] 116 lb (52.6 kg) (08/27 2129) Physical Exam  Constitutional:  oriented to person, place, and time. appears well-developed and well-nourished. No distress.  HENT: [place]/AT, PERRLA, no scleral icterus Mouth/Throat: Oropharynx is clear and moist. No oropharyngeal exudate.  Cardiovascular: irreg, irreg. Exam reveals no gallop and no friction rub.  No murmur heard.  Pulmonary/Chest: Effort normal and breath sounds normal. No respiratory distress.  has no wheezes.  Neck = supple, no nuchal rigidity, lipoma on right side of neck  Abdominal: Soft. Bowel sounds are normal.  exhibits no distension. There is no tenderness.  Lymphadenopathy: no cervical adenopathy. No axillary adenopathy Neurological:  alert and oriented to person, place, and time.  Ext: mild pain with dorsaflexion and plantar flexion of right leg. Pain with movement. No pain with palpation around joint Skin: Skin is warm and dry. No rash noted. No erythema.  Psychiatric: a normal mood and affect.  behavior is normal.    LABS: Results for orders placed or performed during the hospital encounter of 10/05/15 (from the past 48 hour(s))  CBC with Differential/Platelet     Status: Abnormal   Collection Time: 10/05/15  6:23 PM  Result Value Ref Range   WBC 12.7 (H) 4.0 - 10.5 K/uL   RBC 3.60 (L) 3.87 - 5.11 MIL/uL   Hemoglobin 13.8 12.0 - 15.0 g/dL   HCT 40.9 36.0 - 46.0 %   MCV 113.6 (H) 78.0 - 100.0 fL   MCH 38.3 (H) 26.0 - 34.0 pg  MCHC 33.7 30.0 - 36.0 g/dL   RDW 15.3 11.5 - 15.5 %   Platelets 486 (H) 150 - 400 K/uL   Neutrophils Relative % 86 %   Lymphocytes Relative 7 %   Monocytes Relative 6 %   Eosinophils Relative 1 %   Basophils Relative 0 %   Neutro Abs 10.9 (H) 1.7 - 7.7 K/uL   Lymphs Abs 0.9 0.7 - 4.0 K/uL   Monocytes Absolute 0.8 0.1 - 1.0 K/uL   Eosinophils Absolute 0.1 0.0 - 0.7 K/uL   Basophils Absolute 0.0 0.0 - 0.1 K/uL   RBC Morphology POLYCHROMASIA PRESENT   Basic metabolic panel     Status: Abnormal   Collection Time: 10/05/15  6:23 PM  Result Value Ref Range   Sodium 139 135 - 145 mmol/L   Potassium 3.8 3.5 - 5.1 mmol/L   Chloride 102 101 - 111 mmol/L   CO2 31 22 - 32 mmol/L   Glucose, Bld 103 (H) 65 - 99 mg/dL   BUN 35 (H) 6 - 20 mg/dL   Creatinine, Ser 0.80 0.44 - 1.00 mg/dL   Calcium 10.2 8.9 - 10.3 mg/dL   GFR calc non Af Amer >60 >60 mL/min   GFR calc Af Amer >60 >60 mL/min    Comment: (NOTE) The eGFR has been calculated using the CKD EPI equation. This calculation has not been validated in all clinical situations. eGFR's persistently <60 mL/min signify possible Chronic Kidney Disease.    Anion gap 6 5 - 15  Sedimentation rate     Status: None   Collection Time: 10/05/15   6:23 PM  Result Value Ref Range   Sed Rate 0 0 - 22 mm/hr  C-reactive protein     Status: None   Collection Time: 10/06/15 12:23 AM  Result Value Ref Range   CRP <0.5 <1.0 mg/dL    Comment: Performed at [facility]  Comprehensive metabolic panel     Status: Abnormal   Collection Time: 10/06/15  4:08 AM  Result Value Ref Range   Sodium 142 135 - 145 mmol/L   Potassium 3.8 3.5 - 5.1 mmol/L   Chloride 101 101 - 111 mmol/L   CO2 33 (H) 22 - 32 mmol/L   Glucose, Bld 114 (H) 65 - 99 mg/dL   BUN 32 (H) 6 - 20 mg/dL   Creatinine, Ser 0.88 0.44 - 1.00 mg/dL   Calcium 9.9 8.9 - 10.3 mg/dL   Total Protein 6.5 6.5 - 8.1 g/dL   Albumin 4.3 3.5 - 5.0 g/dL   AST 18 15 - 41 U/L   ALT 18 14 - 54 U/L   Alkaline Phosphatase 49 38 - 126 U/L   Total Bilirubin 0.6 0.3 - 1.2 mg/dL   GFR calc non Af Amer 59 (L) >60 mL/min   GFR calc Af Amer >60 >60 mL/min    Comment: (NOTE) The eGFR has been calculated using the CKD EPI equation. This calculation has not been validated in all clinical situations. eGFR's persistently <60 mL/min signify possible Chronic Kidney Disease.    Anion gap 8 5 - 15  CBC     Status: Abnormal   Collection Time: 10/06/15  4:08 AM  Result Value Ref Range   WBC 12.2 (H) 4.0 - 10.5 K/uL   RBC 3.70 (L) 3.87 - 5.11 MIL/uL   Hemoglobin 14.0 12.0 - 15.0 g/dL   HCT 42.6 36.0 - 46.0 %   MCV 115.1 (H) 78.0 - 100.0 fL  MCH 37.8 (H) 26.0 - 34.0 pg   MCHC 32.9 30.0 - 36.0 g/dL   RDW 15.6 (H) 11.5 - 15.5 %   Platelets 524 (H) 150 - 400 K/uL    MICRO:  IMAGING: Mr Hip Right W Wo Contrast  Result Date: 10/05/2015 CLINICAL DATA:  Right hip pain. EXAM: MRI OF THE RIGHT HIP WITHOUT AND WITH CONTRAST TECHNIQUE: Multiplanar, multisequence MR imaging was performed both before and after administration of intravenous contrast. CONTRAST:  89m MULTIHANCE GADOBENATE DIMEGLUMINE 529 MG/ML IV SOLN COMPARISON:  Right hip radiographs 10/05/2015 FINDINGS: Bones: There is an area of  abnormal increased T2 signal intensity demonstrated marrow and cortical portion of the proximal right femoral shaft bowel below the trochanteric region. This corresponds with a focal abnormality on recent plain radiographs. There is mild T2 signal abnormality also in the soft tissues surrounding this area. Appearance is most suggestive of the area of osteomyelitis with a focal sinus tract. Nondisplaced fracture or infiltrating neoplasm felt less likely. There is no expansile change. Except for the focal cortical abnormality, the bone cortex is otherwise intact. No endosteal scalloping. Diffuse heterogeneous appearance to the marrow throughout the visualized pelvis and bilateral hips likely representing asymmetric fatty marrow replacement or anemia. Articular cartilage and labrum Articular cartilage: Limited visualization due to motion artifact. Appears grossly intact. Labrum:  Limited visualization.  Appears grossly intact. Joint or bursal effusion Joint effusion:  Small right hip effusion. Bursae:  No bursal collections. Muscles and tendons Muscles and tendons:  No acute abnormality. Other findings Miscellaneous:  None. IMPRESSION: Abnormal marrow signal intensity demonstrated in the proximal right femoral shaft with small focal cortical defect and surrounding soft tissue infiltration. Appearance most likely to represent osteomyelitis with sinus tract. Electronically Signed   By: WLucienne CapersM.D.   On: 10/05/2015 21:38   Dg Hip Unilat W Or Wo Pelvis 2-3 Views Right  Result Date: 10/05/2015 CLINICAL DATA:  Sharp pain in the right hip. EXAM: DG HIP (WITH OR WITHOUT PELVIS) 2-3V RIGHT COMPARISON:  None. FINDINGS: There is no evidence of hip fracture or dislocation. There are advanced osteoarthritic changes of the right hip and moderate osteoarthritic changes of the left hip. Cortical irregularity with sclerotic margins of the lateral aspect of the proximal right femur may represent a healed fracture. Soft  tissues are grossly normal. IMPRESSION: Advanced osteoarthritic changes of the right hip and moderate osteoarthritic changes of the left hip. Cortical irregularity of the proximal right femur may represent a subacute fracture. Electronically Signed   By: DFidela SalisburyM.D.   On: 10/05/2015 17:48    HISTORICAL MICRO/IMAGING  Assessment/Plan:  89yoF with polycythemia vera, afib, severe osteoporosis of hip and spine, who has acute pain to right hip/leg,unable to bear weight. Imaging suggestive of stress fracture vs. Early osteomyelitis  - Mays hold off on giving further antibiotics until we get finalized read on MRI. I suspect her story and imaging is more consistent with stress fracture of right hip/prox femur  - recommend to have orthopedics assess patient to see what recommendations to ensure better healing. See if she needs any procedure for stabilization.  Otitis media/sinusitis = she is currently on day 6 of abtx. Mays stop for now and reassess after she is seen by orthopedics.  Leukocytosis = Mays continue to monitor, can be elevated in setting of stress.  Mays provide further recommendations tomorrow

## 2015-10-06 NOTE — Progress Notes (Signed)
PROGRESS NOTE    Sarah Mays  [id number] DOB: 1931-01-23 DOA: 10/05/2015 PCP: Hollace Kinnier, DO  Outpatient Specialists:   Brief Narrative: 82 yo female with history of CAD, diastolic HF, HTN, hypothyroidism, Afib who came with cc of right hip pain that is sudden after she kicked her right leg with her left foot trying to take off her shoes. She has had a dull constant pain in her right thigh/hip for the past couple of weeks that was diagnosed as bursitis as she had that last year and for which she had a corticosteroid injection by her PCP at that time and it helped her. Last week, she had another injection and it did not help her much. She denied any other trauma to her hip/thigh. No open wounds or ulcers. No fever or chills. She had sinusitis for 10 days but she just started antibiotics ( Augmentin) a few days ago. She no longer has cough but feels a little nasal congestion. No chest pain or dyspnea. No N/V/D/C/abd pain/dysuria.    Assessment & Plan:   Active Problems:   Osteomyelitis (Sarah Mays)  - Osteomyelitis: Acute vs. Subacute ( with sinus tract in MRI) - Consult ID. Follow work up.  - Paroxysmal afib/A flutter: CHADVASCS >2 On Xarelto Continue amiodarone   - Diastolic HF: Echo in 99991111 with 50% EF Watching I/O inpatient   HTN: Optimize.   Hypothyroidism: cont synthroid   DVT prophylaxis: On Xarelto Code Status: Full Family Communication:  Disposition Plan: To be determined  Consultants:   ID Consultant  Procedures:   None  Antimicrobials:   Vanco/Zosyn   Subjective: No new complaints. No fever or chills.  Objective: Vitals:   10/06/15 0025 10/06/15 0441 10/06/15 0547 10/06/15 1412  BP: (!) 151/70 137/62  (!) 155/67  Pulse: (!) 55 (!) 46 (!) 54 65  Resp: 15 14  14   Temp: 98.7 F (37.1 C) 98.1 F (36.7 C)  98.8 F (37.1 C)  TempSrc: Oral Oral  Oral  SpO2: 98% 94%  92%  Weight:      Height:        Intake/Output Summary (Last 24 hours)  at 10/06/15 1546 Last data filed at 10/06/15 1412  Gross per 24 hour  Intake              750 ml  Output              550 ml  Net              200 ml   Filed Weights   10/05/15 2129  Weight: 52.6 kg (116 lb)    Examination:  General exam: Appears calm and comfortable  Respiratory system: Clear to auscultation.  Cardiovascular system: S1 & S2 heard, RRR. [name] Kitchen Gastrointestinal system: Abdomen is nondistended, soft and nontender. No organomegaly or masses felt.  Central nervous system: Alert and oriented. Moves all limbs. Extremities: No Leg edema.  Data Reviewed: I have personally reviewed following labs and imaging studies  CBC:  Recent Labs Lab 10/05/15 1823 10/06/15 0408  WBC 12.7* 12.2*  NEUTROABS 10.9*  --   HGB 13.8 14.0  HCT 40.9 42.6  MCV 113.6* 115.1*  PLT 486* [id number]*   Basic Metabolic Panel:  Recent Labs Lab 10/05/15 1823 10/06/15 0408  NA 139 142  K 3.8 3.8  CL 102 101  CO2 31 33*  GLUCOSE 103* 114*  BUN 35* 32*  CREATININE 0.80 0.88  CALCIUM 10.2 9.9   GFR: Estimated Creatinine Clearance:  34.8 mL/min (by C-G formula based on SCr of 0.88 mg/dL). Liver Function Tests:  Recent Labs Lab 10/06/15 0408  AST 18  ALT 18  ALKPHOS 49  BILITOT 0.6  PROT 6.5  ALBUMIN 4.3   No results for input(s): LIPASE, AMYLASE in the last 168 hours. No results for input(s): AMMONIA in the last 168 hours. Coagulation Profile: No results for input(s): INR, PROTIME in the last 168 hours. Cardiac Enzymes: No results for input(s): CKTOTAL, CKMB, CKMBINDEX, TROPONINI in the last 168 hours. BNP (last 3 results) No results for input(s): PROBNP in the last 8760 hours. HbA1C: No results for input(s): HGBA1C in the last 72 hours. CBG: No results for input(s): GLUCAP in the last 168 hours. Lipid Profile: No results for input(s): CHOL, HDL, LDLCALC, TRIG, CHOLHDL, LDLDIRECT in the last 72 hours. Thyroid Function Tests: No results for input(s): TSH, T4TOTAL, FREET4,  T3FREE, THYROIDAB in the last 72 hours. Anemia Panel: No results for input(s): VITAMINB12, FOLATE, FERRITIN, TIBC, IRON, RETICCTPCT in the last 72 hours. Urine analysis:    Component Value Date/Time   COLORURINE YELLOW 09/01/2014 2053   APPEARANCEUR CLEAR 09/01/2014 2053   LABSPEC 1.010 09/01/2014 2053   PHURINE 6.0 09/01/2014 2053   GLUCOSEU NEGATIVE 09/01/2014 2053   HGBUR NEGATIVE 09/01/2014 2053   BILIRUBINUR NEGATIVE 09/01/2014 2053   BILIRUBINUR n 11/19/2013 1502   KETONESUR NEGATIVE 09/01/2014 2053   PROTEINUR NEGATIVE 09/01/2014 2053   UROBILINOGEN 0.2 09/01/2014 2053   NITRITE NEGATIVE 09/01/2014 2053   LEUKOCYTESUR NEGATIVE 09/01/2014 2053   Sepsis Labs: @LABRCNTIP (procalcitonin:4,lacticidven:4)  )No results found for this or any previous visit (from the past 240 hour(s)).       Radiology Studies: Mr Hip Right W Wo Contrast  Result Date: 10/05/2015 CLINICAL DATA:  Right hip pain. EXAM: MRI OF THE RIGHT HIP WITHOUT AND WITH CONTRAST TECHNIQUE: Multiplanar, multisequence MR imaging was performed both before and after administration of intravenous contrast. CONTRAST:  67mL MULTIHANCE GADOBENATE DIMEGLUMINE 529 MG/ML IV SOLN COMPARISON:  Right hip radiographs 10/05/2015 FINDINGS: Bones: There is an area of abnormal increased T2 signal intensity demonstrated marrow and cortical portion of the proximal right femoral shaft bowel below the trochanteric region. This corresponds with a focal abnormality on recent plain radiographs. There is mild T2 signal abnormality also in the soft tissues surrounding this area. Appearance is most suggestive of the area of osteomyelitis with a focal sinus tract. Nondisplaced fracture or infiltrating neoplasm felt less likely. There is no expansile change. Except for the focal cortical abnormality, the bone cortex is otherwise intact. No endosteal scalloping. Diffuse heterogeneous appearance to the marrow throughout the visualized pelvis and  bilateral hips likely representing asymmetric fatty marrow replacement or anemia. Articular cartilage and labrum Articular cartilage: Limited visualization due to motion artifact. Appears grossly intact. Labrum:  Limited visualization.  Appears grossly intact. Joint or bursal effusion Joint effusion:  Small right hip effusion. Bursae:  No bursal collections. Muscles and tendons Muscles and tendons:  No acute abnormality. Other findings Miscellaneous:  None. IMPRESSION: Abnormal marrow signal intensity demonstrated in the proximal right femoral shaft with small focal cortical defect and surrounding soft tissue infiltration. Appearance most likely to represent osteomyelitis with sinus tract. Electronically Signed   By: Lucienne Capers M.D.   On: 10/05/2015 21:38   Dg Hip Unilat W Or Wo Pelvis 2-3 Views Right  Result Date: 10/05/2015 CLINICAL DATA:  Sharp pain in the right hip. EXAM: DG HIP (WITH OR WITHOUT PELVIS) 2-3V RIGHT COMPARISON:  None.  FINDINGS: There is no evidence of hip fracture or dislocation. There are advanced osteoarthritic changes of the right hip and moderate osteoarthritic changes of the left hip. Cortical irregularity with sclerotic margins of the lateral aspect of the proximal right femur may represent a healed fracture. Soft tissues are grossly normal. IMPRESSION: Advanced osteoarthritic changes of the right hip and moderate osteoarthritic changes of the left hip. Cortical irregularity of the proximal right femur may represent a subacute fracture. Electronically Signed   By: Fidela Salisbury M.D.   On: 10/05/2015 17:48        Scheduled Meds: . amiodarone  200 mg Oral Daily  . [START ON 10/08/2015] fentaNYL  25 mcg Transdermal Q72H  . hydrALAZINE  50 mg Oral TID  . levothyroxine  112 mcg Oral QAC breakfast  . magnesium oxide  200 mg Oral Daily  . piperacillin-tazobactam (ZOSYN)  IV  3.375 g Intravenous Q8H  . Rivaroxaban  15 mg Oral Q supper  . senna-docusate  2 tablet Oral  BID  . vancomycin  750 mg Intravenous Q24H   Continuous Infusions:    LOS: 1 day    Time spent: Greater than 30 Minutes.    Dana Allan, MD  [facility] Pager #: [phone] 7PM-7AM contact night coverage as above

## 2015-10-07 DIAGNOSIS — H6192 Disorder of left external ear, unspecified: Secondary | ICD-10-CM

## 2015-10-07 DIAGNOSIS — I4891 Unspecified atrial fibrillation: Secondary | ICD-10-CM

## 2015-10-07 DIAGNOSIS — M84351A Stress fracture, right femur, initial encounter for fracture: Principal | ICD-10-CM

## 2015-10-07 DIAGNOSIS — X58XXXA Exposure to other specified factors, initial encounter: Secondary | ICD-10-CM

## 2015-10-07 DIAGNOSIS — S7291XA Unspecified fracture of right femur, initial encounter for closed fracture: Secondary | ICD-10-CM

## 2015-10-07 LAB — CBC
HCT: 44.7 % (ref 36.0–46.0)
Hemoglobin: 15 g/dL (ref 12.0–15.0)
MCH: 38.7 pg — AB (ref 26.0–34.0)
MCHC: 33.6 g/dL (ref 30.0–36.0)
MCV: 115.2 fL — AB (ref 78.0–100.0)
Platelets: 507 10*3/uL — ABNORMAL HIGH (ref 150–400)
RBC: 3.88 MIL/uL (ref 3.87–5.11)
RDW: 15.7 % — ABNORMAL HIGH (ref 11.5–15.5)
WBC: 16.2 10*3/uL — ABNORMAL HIGH (ref 4.0–10.5)

## 2015-10-07 LAB — DIFFERENTIAL
Basophils Absolute: 0.1 10*3/uL (ref 0.0–0.1)
Basophils Relative: 0 %
EOS PCT: 1 %
Eosinophils Absolute: 0.1 10*3/uL (ref 0.0–0.7)
LYMPHS ABS: 1.2 10*3/uL (ref 0.7–4.0)
LYMPHS PCT: 7 %
MONO ABS: 1.5 10*3/uL — AB (ref 0.1–1.0)
Monocytes Relative: 9 %
NEUTROS ABS: 14.1 10*3/uL — AB (ref 1.7–7.7)
NEUTROS PCT: 83 %

## 2015-10-07 LAB — BASIC METABOLIC PANEL
ANION GAP: 6 (ref 5–15)
BUN: 20 mg/dL (ref 6–20)
CHLORIDE: 101 mmol/L (ref 101–111)
CO2: 32 mmol/L (ref 22–32)
CREATININE: 0.82 mg/dL (ref 0.44–1.00)
Calcium: 10.4 mg/dL — ABNORMAL HIGH (ref 8.9–10.3)
GFR calc non Af Amer: >60 mL/min (ref >60)
Glucose, Bld: 87 mg/dL (ref 65–99)
Potassium: 3.4 mmol/L — ABNORMAL LOW (ref 3.5–5.1)
SODIUM: 139 mmol/L (ref 135–145)

## 2015-10-07 LAB — SURGICAL PCR SCREEN
MRSA, PCR: NEGATIVE
STAPHYLOCOCCUS AUREUS: NEGATIVE

## 2015-10-07 MED ORDER — HYDROXYUREA 500 MG PO CAPS
500.0000 mg | ORAL_CAPSULE | Freq: Every day | ORAL | Status: DC
  Administered 2015-10-07 – 2015-10-08 (×2): 500 mg via ORAL
  Filled 2015-10-07 (×3): qty 1

## 2015-10-07 MED ORDER — CEFAZOLIN SODIUM-DEXTROSE 2-4 GM/100ML-% IV SOLN
2.0000 g | INTRAVENOUS | Status: AC
  Administered 2015-10-08: 2 g via INTRAVENOUS
  Filled 2015-10-07: qty 100

## 2015-10-07 MED ORDER — BISACODYL 10 MG RE SUPP
10.0000 mg | Freq: Once | RECTAL | Status: AC
  Administered 2015-10-07: 10 mg via RECTAL
  Filled 2015-10-07: qty 1

## 2015-10-07 MED ORDER — POVIDONE-IODINE 10 % EX SWAB: 2.0000 "application " | Freq: Once | CUTANEOUS | Status: DC

## 2015-10-07 MED ORDER — POTASSIUM CHLORIDE CRYS ER 10 MEQ PO TBCR
10.0000 meq | EXTENDED_RELEASE_TABLET | Freq: Two times a day (BID) | ORAL | Status: DC
  Administered 2015-10-07 – 2015-10-10 (×7): 10 meq via ORAL
  Filled 2015-10-07 (×7): qty 1

## 2015-10-07 MED ORDER — GUAIFENESIN ER 600 MG PO TB12: 600.0000 mg | ORAL_TABLET | Freq: Two times a day (BID) | ORAL | Status: DC | PRN

## 2015-10-07 MED ORDER — RIVAROXABAN 15 MG PO TABS: 15.0000 mg | ORAL_TABLET | Freq: Every day | ORAL | Status: DC

## 2015-10-07 MED ORDER — CHLORHEXIDINE GLUCONATE 4 % EX LIQD: Freq: Every day | CUTANEOUS | Status: DC

## 2015-10-07 MED ORDER — CHLORHEXIDINE GLUCONATE 4 % EX LIQD: 60.0000 mL | Freq: Once | CUTANEOUS | Status: DC

## 2015-10-07 MED ORDER — FUROSEMIDE 40 MG PO TABS
80.0000 mg | ORAL_TABLET | Freq: Every morning | ORAL | Status: DC
  Administered 2015-10-07 – 2015-10-10 (×3): 80 mg via ORAL
  Filled 2015-10-07 (×3): qty 2

## 2015-10-07 MED ORDER — FUROSEMIDE 40 MG PO TABS
60.0000 mg | ORAL_TABLET | Freq: Every day | ORAL | Status: DC
  Administered 2015-10-07: 60 mg via ORAL
  Filled 2015-10-07: qty 1

## 2015-10-07 MED ORDER — GABAPENTIN 100 MG PO CAPS
100.0000 mg | ORAL_CAPSULE | Freq: Every day | ORAL | Status: DC
  Administered 2015-10-07 – 2015-10-09 (×3): 100 mg via ORAL
  Filled 2015-10-07 (×3): qty 1

## 2015-10-07 NOTE — Clinical Social Work Note (Signed)
Clinical Social Work Assessment  Patient Details  Name: Sarah Mays MRN: [id number] Date of Birth: [birth year]  Date of referral:  10/07/15               Reason for consult:  Discharge Planning                Permission sought to share information with:  Chartered certified accountant granted to share information::  Yes, Verbal Permission Granted  Name::        Agency::     Relationship::     Contact Information:     Housing/Transportation Living arrangements for the past 2 months:  Apartment, [place] of Information:  Patient, Adult Children Patient Interpreter Needed:  None Criminal Activity/Legal Involvement Pertinent to Current Situation/Hospitalization:  No - Comment as needed Significant Relationships:  Adult Children, Spouse Lives with:  Spouse Do you feel safe going back to the place where you live?   (SNF needed at d/c.) Need for family participation in patient care:  Yes (Comment)  Care giving concerns: Pt / family feel pt may need more assistance than will be available at independent apt at [facility].   Social Worker assessment / plan: Pt hospitalized on 10/05/15 from [facility] [place] with Osteomyelitis. Ortho has been consulted and a fx was found. Surgery is scheduled for tomorrow. CSW met with pt / daughter to assist with d/c planning. Pt / daughter plan for pt to return to [facility] at d/c. SNF bed will most likely be needed. CSW has contacted [facility] and clinicals sent for review. [facility] will have rehab bed available at d/c. CSW will continue to follow to assist with d/c planning to SNF.  Employment status:  Retired Forensic scientist:  Medicare PT Recommendations:  Not assessed at this time [place] / Referral to community resources:  [place]  Patient/Family's Response to care:  Pt / family are in agreement with pt returning to [facility] at d/c. A rehab bed will  be available.  Patient/Family's Understanding of and Emotional Response to Diagnosis, Current Treatment, and Prognosis: Pt / daughter are aware of pt's medical status and pending surgery. " I'll be happy when surgery is all over". Support / reassurance provided.  Emotional Assessment Appearance:  Appears stated age Attitude/Demeanor/Rapport:  Other (cooperative) Affect (typically observed):  Calm, Appropriate Orientation:  Oriented to Self, Oriented to Place, Oriented to Situation, Oriented to  Time Alcohol / Substance use:  Not Applicable Psych involvement (Current and /or in the community):  No (Comment)  Discharge Needs  Concerns to be addressed:  Discharge Planning Concerns Readmission within the last 30 days:  No Current discharge risk:  None Barriers to Discharge:  No Barriers Identified   Loraine Maple  [phone] 10/07/2015, 12:55 PM

## 2015-10-07 NOTE — Progress Notes (Signed)
PROGRESS NOTE    Sarah Mays  [id number] DOB: [birth year] DOA: 10/05/2015 PCP: Hollace Kinnier, DO  Outpatient Specialists:   Brief Narrative: 82 yo female with history of CAD, diastolic HF, HTN, hypothyroidism, Afib who came with cc of right hip pain that is sudden after she kicked her right leg with her left foot trying to take off her shoes. She has had a dull constant pain in her right thigh/hip for the past couple of weeks that was diagnosed as bursitis as she had that last year and for which she had a corticosteroid injection by her PCP at that time and it helped her. Last week, she had another injection and it did not help her much. She denied any other trauma to her hip/thigh. No open wounds or ulcers. No fever or chills. She had sinusitis for 10 days but she just started antibiotics ( Augmentin) a few days ago. She no longer has cough but feels a little nasal congestion. No chest pain or dyspnea. No N/V/D/C/abd pain/dysuria.    Assessment & Plan:   Active Problems:   Stress fracture of right femur - Follow Ortho Surgery in am. Hold Xarelto. Antibiotics discontinued. ID and Ortho input is appreciated.  - Paroxysmal afib/A flutter: CHADVASCS >2 Xarelto is on hold Continue amiodarone   - Diastolic HF: Echo in 99991111 with 50% EF Watching I/O inpatient   HTN: Optimize.   Hypothyroidism: cont synthroid   DVT prophylaxis: Xarelto on Hold. Code Status: Full Family Communication:  Disposition Plan: To be determined  Consultants:   ID Consultant  Orthopedics   Procedures:   None  Antimicrobials:   None.   Subjective: No new complaints. No fever or chills.  Objective: Vitals:   10/06/15 0547 10/06/15 1412 10/06/15 2115 10/07/15 0500  BP:  (!) 155/67 (!) 163/59 137/81  Pulse: (!) 54 65 63 67  Resp:  14 20 20   Temp:  98.8 F (37.1 C) 98.4 F (36.9 C) 98.6 F (37 C)  TempSrc:  Oral Oral Oral  SpO2:  92% 97% 94%  Weight:      Height:         Intake/Output Summary (Last 24 hours) at 10/07/15 1317 Last data filed at 10/07/15 1230  Gross per 24 hour  Intake              600 ml  Output             2825 ml  Net            -2225 ml   Filed Weights   10/05/15 2129  Weight: 52.6 kg (116 lb)    Examination:  General exam: Appears calm and comfortable  Respiratory system: Clear to auscultation.  Cardiovascular system: S1 & S2 heard, RRR. [name] Kitchen Gastrointestinal system: Abdomen is nondistended, soft and nontender. No organomegaly or masses felt.  Central nervous system: Alert and oriented. Moves all limbs. Extremities: No Leg edema.  Data Reviewed: I have personally reviewed following labs and imaging studies  CBC:  Recent Labs Lab 10/05/15 1823 10/06/15 0408 10/07/15 0833  WBC 12.7* 12.2* 16.2*  NEUTROABS 10.9*  --   --   HGB 13.8 14.0 15.0  HCT 40.9 42.6 44.7  MCV 113.6* 115.1* 115.2*  PLT 486* 524* 0000000*   Basic Metabolic Panel:  Recent Labs Lab 10/05/15 1823 10/06/15 0408 10/07/15 0833  NA 139 142 139  K 3.8 3.8 3.4*  CL 102 101 101  CO2 31 33* 32  GLUCOSE 103* 114*  87  BUN 35* 32* 20  CREATININE 0.80 0.88 0.82  CALCIUM 10.2 9.9 10.4*   GFR: Estimated Creatinine Clearance: 37.3 mL/min (by C-G formula based on SCr of 0.82 mg/dL). Liver Function Tests:  Recent Labs Lab 10/06/15 0408  AST 18  ALT 18  ALKPHOS 49  BILITOT 0.6  PROT 6.5  ALBUMIN 4.3   No results for input(s): LIPASE, AMYLASE in the last 168 hours. No results for input(s): AMMONIA in the last 168 hours. Coagulation Profile: No results for input(s): INR, PROTIME in the last 168 hours. Cardiac Enzymes: No results for input(s): CKTOTAL, CKMB, CKMBINDEX, TROPONINI in the last 168 hours. BNP (last 3 results) No results for input(s): PROBNP in the last 8760 hours. HbA1C: No results for input(s): HGBA1C in the last 72 hours. CBG: No results for input(s): GLUCAP in the last 168 hours. Lipid Profile: No results for input(s): CHOL,  HDL, LDLCALC, TRIG, CHOLHDL, LDLDIRECT in the last 72 hours. Thyroid Function Tests: No results for input(s): TSH, T4TOTAL, FREET4, T3FREE, THYROIDAB in the last 72 hours. Anemia Panel: No results for input(s): VITAMINB12, FOLATE, FERRITIN, TIBC, IRON, RETICCTPCT in the last 72 hours. Urine analysis:    Component Value Date/Time   COLORURINE YELLOW 09/01/2014 2053   APPEARANCEUR CLEAR 09/01/2014 2053   LABSPEC 1.010 09/01/2014 2053   PHURINE 6.0 09/01/2014 2053   GLUCOSEU NEGATIVE 09/01/2014 2053   HGBUR NEGATIVE 09/01/2014 2053   BILIRUBINUR NEGATIVE 09/01/2014 2053   BILIRUBINUR n 11/19/2013 1502   KETONESUR NEGATIVE 09/01/2014 2053   PROTEINUR NEGATIVE 09/01/2014 2053   UROBILINOGEN 0.2 09/01/2014 2053   NITRITE NEGATIVE 09/01/2014 2053   LEUKOCYTESUR NEGATIVE 09/01/2014 2053   Sepsis Labs: @LABRCNTIP (procalcitonin:4,lacticidven:4)  ) Recent Results (from the past 240 hour(s))  Culture, blood (routine x 2)     Status: None (Preliminary result)   Collection Time: 10/06/15 12:23 AM  Result Value Ref Range Status   Specimen Description BLOOD LEFT ARM  Final   Special Requests BOTTLES DRAWN AEROBIC AND ANAEROBIC 10CC  Final   Culture   Final    NO GROWTH 1 DAY Performed at [facility]    Report Status PENDING  Incomplete  Culture, blood (routine x 2)     Status: None (Preliminary result)   Collection Time: 10/06/15 12:23 AM  Result Value Ref Range Status   Specimen Description BLOOD LEFT ARM  Final   Special Requests BOTTLES DRAWN AEROBIC AND ANAEROBIC 10CC  Final   Culture   Final    NO GROWTH 1 DAY Performed at [facility]    Report Status PENDING  Incomplete         Radiology Studies: Mr Hip Right W Wo Contrast  Addendum Date: 10/06/2015   ADDENDUM REPORT: 10/06/2015 16:41 ADDENDUM: The original report was by Dr. Viona Gilmore. Leonidas Romberg. The following addendum is by Dr. Van Clines: I was phoned by Dr. Baxter Flattery in Infectious Disease at about 4:15  p.m. on 10/06/2015 to review this case which was interpreted last night on-call. I discussed the case by phone with Dr. Baxter Flattery as we reviewed the images. I observe focal cortical thickening in the proximal femoral diaphysis laterally in a pattern consistent with a stress fracture, with surrounding periostitis and adjacent marrow edema. I do not see a sinus tract, and I suspect the concern for sinus tract initially raised may have been based on assessment of an adjacent vessel as being a sinus tract, but no sinus tract is retrospectively visible and Dr. Baxter Flattery tells  me that there is no draining sinus tract to the skin on physical exam inspection. I do note that this somewhat atypical location for femoral stress fracture has been associated in the literature with the use of bisphosphonates. Correlate with patient medication history. Note is also made of a herniation of bowel through the inferolateral margin of the right lateral abdominal wall musculature, shown on image 31/6. There is degenerative arthropathy of both hips and degenerative disc disease in the lower lumbar spine. Electronically Signed   By: Van Clines M.D.   On: 10/06/2015 16:41   Result Date: 10/06/2015 CLINICAL DATA:  Right hip pain. EXAM: MRI OF THE RIGHT HIP WITHOUT AND WITH CONTRAST TECHNIQUE: Multiplanar, multisequence MR imaging was performed both before and after administration of intravenous contrast. CONTRAST:  110mL MULTIHANCE GADOBENATE DIMEGLUMINE 529 MG/ML IV SOLN COMPARISON:  Right hip radiographs 10/05/2015 FINDINGS: Bones: There is an area of abnormal increased T2 signal intensity demonstrated marrow and cortical portion of the proximal right femoral shaft bowel below the trochanteric region. This corresponds with a focal abnormality on recent plain radiographs. There is mild T2 signal abnormality also in the soft tissues surrounding this area. Appearance is most suggestive of the area of osteomyelitis with a focal sinus tract.  Nondisplaced fracture or infiltrating neoplasm felt less likely. There is no expansile change. Except for the focal cortical abnormality, the bone cortex is otherwise intact. No endosteal scalloping. Diffuse heterogeneous appearance to the marrow throughout the visualized pelvis and bilateral hips likely representing asymmetric fatty marrow replacement or anemia. Articular cartilage and labrum Articular cartilage: Limited visualization due to motion artifact. Appears grossly intact. Labrum:  Limited visualization.  Appears grossly intact. Joint or bursal effusion Joint effusion:  Small right hip effusion. Bursae:  No bursal collections. Muscles and tendons Muscles and tendons:  No acute abnormality. Other findings Miscellaneous:  None. IMPRESSION: Abnormal marrow signal intensity demonstrated in the proximal right femoral shaft with small focal cortical defect and surrounding soft tissue infiltration. Appearance most likely to represent osteomyelitis with sinus tract. Electronically Signed: By: Lucienne Capers M.D. On: 10/05/2015 21:38   Dg Hip Unilat W Or Wo Pelvis 2-3 Views Right  Result Date: 10/05/2015 CLINICAL DATA:  Sharp pain in the right hip. EXAM: DG HIP (WITH OR WITHOUT PELVIS) 2-3V RIGHT COMPARISON:  None. FINDINGS: There is no evidence of hip fracture or dislocation. There are advanced osteoarthritic changes of the right hip and moderate osteoarthritic changes of the left hip. Cortical irregularity with sclerotic margins of the lateral aspect of the proximal right femur may represent a healed fracture. Soft tissues are grossly normal. IMPRESSION: Advanced osteoarthritic changes of the right hip and moderate osteoarthritic changes of the left hip. Cortical irregularity of the proximal right femur may represent a subacute fracture. Electronically Signed   By: Fidela Salisbury M.D.   On: 10/05/2015 17:48        Scheduled Meds: . amiodarone  200 mg Oral Daily  . [START ON 10/08/2015] fentaNYL   25 mcg Transdermal Q72H  . furosemide  60 mg Oral Daily  . furosemide  80 mg Oral q morning - 10a  . gabapentin  100 mg Oral QHS  . hydrALAZINE  50 mg Oral TID  . hydroxyurea  500 mg Oral Daily  . levothyroxine  112 mcg Oral QAC breakfast  . magnesium oxide  200 mg Oral Daily  . polycarbophil  1,250 mg Oral BID  . potassium chloride  10 mEq Oral BID  . [START ON  10/09/2015] Rivaroxaban  15 mg Oral Q supper  . senna-docusate  2 tablet Oral BID   Continuous Infusions:    LOS: 2 days    Time spent: Greater than 30 Minutes.    Dana Allan, MD  [facility] Pager #: [phone] 7PM-7AM contact night coverage as above

## 2015-10-07 NOTE — Progress Notes (Signed)
[facility] for [facility]ious Disease    Date of Admission:  10/05/2015   Total days of antibiotics 1           ID: Sarah Mays is a 82 y.o. female with  Polycythemia vera Active Problems:   Osteomyelitis (HCC)    Subjective: Afebrile. Still has decreased hearing on left ear from recent sinusitis/OM. She is tolerating going to bedside commode. Seen by Dr. Alvan Dame this morning who will stabilize fracture tomorrow  Medications:  . amiodarone  200 mg Oral Daily  . [START ON 10/08/2015] fentaNYL  25 mcg Transdermal Q72H  . furosemide  60 mg Oral Daily  . furosemide  80 mg Oral q morning - 10a  . gabapentin  100 mg Oral QHS  . hydrALAZINE  50 mg Oral TID  . hydroxyurea  500 mg Oral Daily  . levothyroxine  112 mcg Oral QAC breakfast  . magnesium oxide  200 mg Oral Daily  . polycarbophil  1,250 mg Oral BID  . potassium chloride  10 mEq Oral BID  . senna-docusate  2 tablet Oral BID    Objective: Vital signs in last 24 hours: Temp:  [98.4 F (36.9 C)-98.6 F (37 C)] 98.4 F (36.9 C) (08/29 1442) Pulse Rate:  [63-99] 99 (08/29 1442) Resp:  [18-20] 18 (08/29 1442) BP: (137-163)/(59-91) 141/91 (08/29 1442) SpO2:  [92 %-97 %] 92 % (08/29 1442) Physical Exam  Constitutional:  oriented to person, place, and time. appears well-developed and well-nourished. No distress.  HENT: [place]/AT, PERRLA, no scleral icterus, slightly injected  Mouth/Throat: Oropharynx is clear and moist. No oropharyngeal exudate.  Cardiovascular: irreg, irreg and normal heart sounds. Exam reveals no gallop and no friction rub.  No murmur heard.  Pulmonary/Chest: Effort normal and breath sounds normal. No respiratory distress.  has no wheezes.  Neck = supple, no nuchal rigidity, lipoma to right side of neck Neurological: alert and oriented to person, place, and time.  Skin: Skin is warm and dry. No rash noted. No erythema.  Psychiatric: a normal mood and affect.  behavior is normal.    Lab  Results  Recent Labs  10/06/15 0408 10/07/15 0833  WBC 12.2* 16.2*  HGB 14.0 15.0  HCT 42.6 44.7  NA 142 139  K 3.8 3.4*  CL 101 101  CO2 33* 32  BUN 32* 20  CREATININE 0.88 0.82   Liver Panel  Recent Labs  10/06/15 0408  PROT 6.5  ALBUMIN 4.3  AST 18  ALT 18  ALKPHOS 49  BILITOT 0.6   Sedimentation Rate  Recent Labs  10/05/15 1823  ESRSEDRATE 0   C-Reactive Protein  Recent Labs  10/06/15 0023  CRP <0.5    Microbiology:  Studies/Results: Mr Hip Right W Wo Contrast  Addendum Date: 10/06/2015   ADDENDUM REPORT: 10/06/2015 16:41 ADDENDUM: The original report was by Dr. Viona Gilmore. Leonidas Romberg. The following addendum is by Dr. Van Clines: I was phoned by Dr. Baxter Flattery in Infectious Disease at about 4:15 p.m. on 10/06/2015 to review this case which was interpreted last night on-call. I discussed the case by phone with Dr. Baxter Flattery as we reviewed the images. I observe focal cortical thickening in the proximal femoral diaphysis laterally in a pattern consistent with a stress fracture, with surrounding periostitis and adjacent marrow edema. I do not see a sinus tract, and I suspect the concern for sinus tract initially raised may have been based on assessment of an adjacent vessel as being a sinus tract,  but no sinus tract is retrospectively visible and Dr. Baxter Flattery tells me that there is no draining sinus tract to the skin on physical exam inspection. I do note that this somewhat atypical location for femoral stress fracture has been associated in the literature with the use of bisphosphonates. Correlate with patient medication history. Note is also made of a herniation of bowel through the inferolateral margin of the right lateral abdominal wall musculature, shown on image 31/6. There is degenerative arthropathy of both hips and degenerative disc disease in the lower lumbar spine. Electronically Signed   By: Van Clines M.D.   On: 10/06/2015 16:41   Result Date:  10/06/2015 CLINICAL DATA:  Right hip pain. EXAM: MRI OF THE RIGHT HIP WITHOUT AND WITH CONTRAST TECHNIQUE: Multiplanar, multisequence MR imaging was performed both before and after administration of intravenous contrast. CONTRAST:  56mL MULTIHANCE GADOBENATE DIMEGLUMINE 529 MG/ML IV SOLN COMPARISON:  Right hip radiographs 10/05/2015 FINDINGS: Bones: There is an area of abnormal increased T2 signal intensity demonstrated marrow and cortical portion of the proximal right femoral shaft bowel below the trochanteric region. This corresponds with a focal abnormality on recent plain radiographs. There is mild T2 signal abnormality also in the soft tissues surrounding this area. Appearance is most suggestive of the area of osteomyelitis with a focal sinus tract. Nondisplaced fracture or infiltrating neoplasm felt less likely. There is no expansile change. Except for the focal cortical abnormality, the bone cortex is otherwise intact. No endosteal scalloping. Diffuse heterogeneous appearance to the marrow throughout the visualized pelvis and bilateral hips likely representing asymmetric fatty marrow replacement or anemia. Articular cartilage and labrum Articular cartilage: Limited visualization due to motion artifact. Appears grossly intact. Labrum:  Limited visualization.  Appears grossly intact. Joint or bursal effusion Joint effusion:  Small right hip effusion. Bursae:  No bursal collections. Muscles and tendons Muscles and tendons:  No acute abnormality. Other findings Miscellaneous:  None. IMPRESSION: Abnormal marrow signal intensity demonstrated in the proximal right femoral shaft with small focal cortical defect and surrounding soft tissue infiltration. Appearance most likely to represent osteomyelitis with sinus tract. Electronically Signed: By: Lucienne Capers M.D. On: 10/05/2015 21:38   Dg Hip Unilat W Or Wo Pelvis 2-3 Views Right  Result Date: 10/05/2015 CLINICAL DATA:  Sharp pain in the right hip. EXAM: DG  HIP (WITH OR WITHOUT PELVIS) 2-3V RIGHT COMPARISON:  None. FINDINGS: There is no evidence of hip fracture or dislocation. There are advanced osteoarthritic changes of the right hip and moderate osteoarthritic changes of the left hip. Cortical irregularity with sclerotic margins of the lateral aspect of the proximal right femur may represent a healed fracture. Soft tissues are grossly normal. IMPRESSION: Advanced osteoarthritic changes of the right hip and moderate osteoarthritic changes of the left hip. Cortical irregularity of the proximal right femur may represent a subacute fracture. Electronically Signed   By: Fidela Salisbury M.D.   On: 10/05/2015 17:48     Assessment/Plan:  Right femur stress fracture = going to OR tomorrow for stabilization. Will check mrsa colonization in case needs to be decolonized  Leukocytosis = likely stress reaction but will continue to follow cbc tomorrow  Decreased hearing of left ear = will check with otoscope to see if cerumen impaction vs. Ongoing otitis media requiring further abtx  Questionable osteo = read of mri suggest that changes are more consistent with stress fracture   Baxter Flattery [facility] for Infectious Diseases Cell: [phone] Pager: [phone]  10/07/2015, 3:38 PM

## 2015-10-07 NOTE — Progress Notes (Signed)
Urinary Catheter, 14 Fr, was inserted at 1030. Patient complained that catheter felt like it was leaking. Upon assessment, urinary catheter was leaking. RN inserted 5 more mL of normal saline into the balloon of the catheter to see if this would help the cathter stop leaking.  Patient continued to complain that the catheter was leaking. RN assessed for a second time, and catheter was noted to still be leaking.   RN asked patient if she wanted to try a larger size catheter, or if she wanted to not try another catheter and just use the bedside commode.   Patient stated she would try the larger catheter to help prevent her from having to use the bathroom and getting up and down every 15 minutes.   RN inserted a second catheter, size 16 Fr this time. RN inserted the catheter with ease, and continued to change patients linen as it was wet underneath her.   When RN finished changing the patient, and left her lying comfortably in bed, RN was washing her hands when patient stated that she felt the catheter leaking again.   RN then proceeded to remove the catheter. After catheter was removed, patient stated that she needed to urinate immediately. RN then assisted patient to the bedside commode where she then voided.

## 2015-10-08 ENCOUNTER — Inpatient Hospital Stay (HOSPITAL_COMMUNITY): Payer: Medicare Other

## 2015-10-08 ENCOUNTER — Encounter (HOSPITAL_COMMUNITY): Payer: Self-pay | Admitting: Certified Registered"

## 2015-10-08 ENCOUNTER — Inpatient Hospital Stay (HOSPITAL_COMMUNITY): Payer: Medicare Other | Admitting: Registered Nurse

## 2015-10-08 ENCOUNTER — Encounter (HOSPITAL_COMMUNITY)
Admission: EM | Disposition: A | Discharge status: Skilled Nursing Facility | Payer: Self-pay | Source: Home / Self Care | Attending: Internal Medicine

## 2015-10-08 DIAGNOSIS — I482 Chronic atrial fibrillation: Secondary | ICD-10-CM

## 2015-10-08 DIAGNOSIS — E038 Other specified hypothyroidism: Secondary | ICD-10-CM

## 2015-10-08 DIAGNOSIS — S7291XG Unspecified fracture of right femur, subsequent encounter for closed fracture with delayed healing: Secondary | ICD-10-CM

## 2015-10-08 HISTORY — PX: FEMUR IM NAIL: SHX1597

## 2015-10-08 LAB — BASIC METABOLIC PANEL
ANION GAP: 7 (ref 5–15)
BUN: 18 mg/dL (ref 6–20)
CHLORIDE: 101 mmol/L (ref 101–111)
CO2: 33 mmol/L — ABNORMAL HIGH (ref 22–32)
Calcium: 10 mg/dL (ref 8.9–10.3)
Creatinine, Ser: 0.8 mg/dL (ref 0.44–1.00)
GFR calc Af Amer: >60 mL/min (ref >60)
GLUCOSE: 86 mg/dL (ref 65–99)
POTASSIUM: 3.4 mmol/L — AB (ref 3.5–5.1)
SODIUM: 141 mmol/L (ref 135–145)

## 2015-10-08 LAB — CBC
HCT: 43.3 % (ref 36.0–46.0)
HEMOGLOBIN: 14.4 g/dL (ref 12.0–15.0)
MCH: 37.8 pg — AB (ref 26.0–34.0)
MCHC: 33.3 g/dL (ref 30.0–36.0)
MCV: 113.6 fL — AB (ref 78.0–100.0)
Platelets: 470 10*3/uL — ABNORMAL HIGH (ref 150–400)
RBC: 3.81 MIL/uL — AB (ref 3.87–5.11)
RDW: 15.7 % — ABNORMAL HIGH (ref 11.5–15.5)
WBC: 12.4 10*3/uL — ABNORMAL HIGH (ref 4.0–10.5)

## 2015-10-08 SURGERY — INSERTION, INTRAMEDULLARY ROD, FEMUR
Anesthesia: General | Site: Leg Upper | Laterality: Right

## 2015-10-08 MED ORDER — DOCUSATE SODIUM 100 MG PO CAPS
100.0000 mg | ORAL_CAPSULE | Freq: Two times a day (BID) | ORAL | Status: DC
Start: 2015-10-08 — End: 2015-10-10
  Administered 2015-10-08 – 2015-10-10 (×4): 100 mg via ORAL
  Filled 2015-10-08 (×4): qty 1

## 2015-10-08 MED ORDER — MENTHOL 3 MG MT LOZG: 1.0000 | LOZENGE | OROMUCOSAL | Status: DC | PRN

## 2015-10-08 MED ORDER — PROMETHAZINE HCL 25 MG/ML IJ SOLN: 6.2500 mg | INTRAMUSCULAR | Status: DC | PRN

## 2015-10-08 MED ORDER — DEXAMETHASONE SODIUM PHOSPHATE 10 MG/ML IJ SOLN
INTRAMUSCULAR | Status: DC | PRN
  Administered 2015-10-08: 10 mg via INTRAVENOUS

## 2015-10-08 MED ORDER — POLYETHYLENE GLYCOL 3350 17 G PO PACK: 17.0000 g | PACK | Freq: Every day | ORAL | Status: DC | PRN

## 2015-10-08 MED ORDER — ONDANSETRON HCL 4 MG/2ML IJ SOLN: 4.0000 mg | Freq: Four times a day (QID) | INTRAMUSCULAR | Status: DC | PRN

## 2015-10-08 MED ORDER — SODIUM CHLORIDE 0.9 % IV SOLN
INTRAVENOUS | Status: DC
  Administered 2015-10-08: 23:00:00 via INTRAVENOUS

## 2015-10-08 MED ORDER — DEXTROSE 5 % IV SOLN
500.0000 mg | Freq: Four times a day (QID) | INTRAVENOUS | Status: DC | PRN
  Administered 2015-10-08: 500 mg via INTRAVENOUS
  Filled 2015-10-08 (×2): qty 5

## 2015-10-08 MED ORDER — LIDOCAINE HCL (CARDIAC) 20 MG/ML IV SOLN
INTRAVENOUS | Status: DC | PRN
  Administered 2015-10-08: 40 mg via INTRAVENOUS

## 2015-10-08 MED ORDER — GLYCOPYRROLATE 0.2 MG/ML IV SOSY
PREFILLED_SYRINGE | INTRAVENOUS | Status: AC
  Filled 2015-10-08: qty 3

## 2015-10-08 MED ORDER — MORPHINE SULFATE (PF) 2 MG/ML IV SOLN: 0.5000 mg | INTRAVENOUS | Status: DC | PRN

## 2015-10-08 MED ORDER — FENTANYL CITRATE (PF) 100 MCG/2ML IJ SOLN
INTRAMUSCULAR | Status: DC | PRN
  Administered 2015-10-08 (×2): 25 ug via INTRAVENOUS
  Administered 2015-10-08: 50 ug via INTRAVENOUS

## 2015-10-08 MED ORDER — PROPOFOL 10 MG/ML IV BOLUS
INTRAVENOUS | Status: DC | PRN
  Administered 2015-10-08: 130 mg via INTRAVENOUS
  Administered 2015-10-08: 50 mg via INTRAVENOUS

## 2015-10-08 MED ORDER — CEFAZOLIN IN D5W 1 GM/50ML IV SOLN
1.0000 g | Freq: Four times a day (QID) | INTRAVENOUS | Status: AC
  Administered 2015-10-08 – 2015-10-09 (×2): 1 g via INTRAVENOUS
  Filled 2015-10-08 (×2): qty 50

## 2015-10-08 MED ORDER — ONDANSETRON HCL 4 MG PO TABS: 4.0000 mg | ORAL_TABLET | Freq: Four times a day (QID) | ORAL | Status: DC | PRN

## 2015-10-08 MED ORDER — LACTATED RINGERS IV SOLN
INTRAVENOUS | Status: DC | PRN
  Administered 2015-10-08: 16:00:00 via INTRAVENOUS

## 2015-10-08 MED ORDER — GLYCOPYRROLATE 0.2 MG/ML IJ SOLN
INTRAMUSCULAR | Status: DC | PRN
  Administered 2015-10-08: 0.2 mg via INTRAVENOUS

## 2015-10-08 MED ORDER — ALUMINUM HYDROXIDE GEL 320 MG/5ML PO SUSP
15.0000 mL | ORAL | Status: DC | PRN
  Filled 2015-10-08: qty 30

## 2015-10-08 MED ORDER — ACETAMINOPHEN 325 MG PO TABS: 650.0000 mg | ORAL_TABLET | Freq: Four times a day (QID) | ORAL | Status: DC | PRN

## 2015-10-08 MED ORDER — HYDROXYUREA 500 MG PO CAPS
500.0000 mg | ORAL_CAPSULE | Freq: Every day | ORAL | Status: DC
  Administered 2015-10-09 – 2015-10-10 (×2): 500 mg via ORAL
  Filled 2015-10-08 (×3): qty 1

## 2015-10-08 MED ORDER — METHOCARBAMOL 500 MG PO TABS: 500.0000 mg | ORAL_TABLET | Freq: Four times a day (QID) | ORAL | Status: DC | PRN

## 2015-10-08 MED ORDER — METOCLOPRAMIDE HCL 5 MG PO TABS: 5.0000 mg | ORAL_TABLET | Freq: Three times a day (TID) | ORAL | Status: DC | PRN

## 2015-10-08 MED ORDER — MEPERIDINE HCL 50 MG/ML IJ SOLN: 6.2500 mg | INTRAMUSCULAR | Status: DC | PRN

## 2015-10-08 MED ORDER — PROPOFOL 10 MG/ML IV BOLUS
INTRAVENOUS | Status: AC
Start: 2015-10-08 — End: 2015-10-08
  Filled 2015-10-08: qty 20

## 2015-10-08 MED ORDER — ONDANSETRON HCL 4 MG/2ML IJ SOLN
INTRAMUSCULAR | Status: AC
  Filled 2015-10-08: qty 2

## 2015-10-08 MED ORDER — ACETAMINOPHEN 650 MG RE SUPP: 650.0000 mg | Freq: Four times a day (QID) | RECTAL | Status: DC | PRN

## 2015-10-08 MED ORDER — ACETAMINOPHEN 10 MG/ML IV SOLN
INTRAVENOUS | Status: AC
Start: 2015-10-08 — End: 2015-10-08
  Administered 2015-10-08: 1000 mg via INTRAVENOUS
  Filled 2015-10-08: qty 100

## 2015-10-08 MED ORDER — RIVAROXABAN 15 MG PO TABS
15.0000 mg | ORAL_TABLET | Freq: Every day | ORAL | Status: DC
Start: 2015-10-09 — End: 2015-10-10
  Administered 2015-10-09: 15 mg via ORAL
  Filled 2015-10-08 (×2): qty 1

## 2015-10-08 MED ORDER — CEFAZOLIN SODIUM-DEXTROSE 2-4 GM/100ML-% IV SOLN
INTRAVENOUS | Status: AC
  Filled 2015-10-08: qty 100

## 2015-10-08 MED ORDER — FENTANYL CITRATE (PF) 100 MCG/2ML IJ SOLN
INTRAMUSCULAR | Status: AC
Start: 2015-10-08 — End: 2015-10-09
  Filled 2015-10-08: qty 2

## 2015-10-08 MED ORDER — ONDANSETRON HCL 4 MG/2ML IJ SOLN
INTRAMUSCULAR | Status: DC | PRN
  Administered 2015-10-08: 4 mg via INTRAVENOUS

## 2015-10-08 MED ORDER — PHENOL 1.4 % MT LIQD
1.0000 | OROMUCOSAL | Status: DC | PRN
Start: 2015-10-08 — End: 2015-10-10

## 2015-10-08 MED ORDER — FENTANYL CITRATE (PF) 100 MCG/2ML IJ SOLN
25.0000 ug | INTRAMUSCULAR | Status: DC | PRN
  Administered 2015-10-08 (×4): 25 ug via INTRAVENOUS

## 2015-10-08 MED ORDER — DEXAMETHASONE SODIUM PHOSPHATE 10 MG/ML IJ SOLN
INTRAMUSCULAR | Status: AC
  Filled 2015-10-08: qty 1

## 2015-10-08 MED ORDER — LIDOCAINE 2% (20 MG/ML) 5 ML SYRINGE
INTRAMUSCULAR | Status: AC
  Filled 2015-10-08: qty 5

## 2015-10-08 MED ORDER — FENTANYL CITRATE (PF) 100 MCG/2ML IJ SOLN
INTRAMUSCULAR | Status: AC
  Filled 2015-10-08: qty 2

## 2015-10-08 MED ORDER — LACTATED RINGERS IV SOLN: INTRAVENOUS | Status: DC

## 2015-10-08 MED ORDER — ACETAMINOPHEN 10 MG/ML IV SOLN: 1000.0000 mg | Freq: Once | INTRAVENOUS | Status: DC

## 2015-10-08 MED ORDER — FERROUS SULFATE 325 (65 FE) MG PO TABS
325.0000 mg | ORAL_TABLET | Freq: Three times a day (TID) | ORAL | Status: DC
  Administered 2015-10-09 – 2015-10-10 (×4): 325 mg via ORAL
  Filled 2015-10-08 (×4): qty 1

## 2015-10-08 MED ORDER — HYDROCODONE-ACETAMINOPHEN 5-325 MG PO TABS
1.0000 | ORAL_TABLET | Freq: Four times a day (QID) | ORAL | Status: DC | PRN
  Administered 2015-10-08 – 2015-10-09 (×3): 2 via ORAL
  Administered 2015-10-09: 1 via ORAL
  Administered 2015-10-10 (×2): 2 via ORAL
  Filled 2015-10-08 (×3): qty 2
  Filled 2015-10-08: qty 1
  Filled 2015-10-08 (×2): qty 2

## 2015-10-08 MED ORDER — METOCLOPRAMIDE HCL 5 MG/ML IJ SOLN: 5.0000 mg | Freq: Three times a day (TID) | INTRAMUSCULAR | Status: DC | PRN

## 2015-10-08 MED ORDER — VITAMIN D3 25 MCG (1000 UNIT) PO TABS
2000.0000 [IU] | ORAL_TABLET | Freq: Every day | ORAL | Status: DC
  Administered 2015-10-09 – 2015-10-10 (×2): 2000 [IU] via ORAL
  Filled 2015-10-08 (×2): qty 2

## 2015-10-08 SURGICAL SUPPLY — 37 items
BIT DRILL 3.8X8 NS (BIT) ×2 IMPLANT
BIT DRILL 5.3 NS (BIT) ×2 IMPLANT
BIT DRILL 6.5X4.8 (BIT) ×2 IMPLANT
BNDG GAUZE ELAST 4 BULKY (GAUZE/BANDAGES/DRESSINGS) ×3 IMPLANT
COVER PERINEAL POST (MISCELLANEOUS) ×3 IMPLANT
DRAPE STERI IOBAN 125X83 (DRAPES) ×3 IMPLANT
DRESSING AQUACEL AG SP 3.5X10 (GAUZE/BANDAGES/DRESSINGS) IMPLANT
DRESSING AQUACEL AG SP 3.5X4 (GAUZE/BANDAGES/DRESSINGS) ×1 IMPLANT
DRSG AQUACEL AG SP 3.5X10 (GAUZE/BANDAGES/DRESSINGS) ×3
DRSG AQUACEL AG SP 3.5X4 (GAUZE/BANDAGES/DRESSINGS) ×3
DURAPREP 26ML APPLICATOR (WOUND CARE) ×3 IMPLANT
ELECT REM PT RETURN 9FT ADLT (ELECTROSURGICAL) ×3
ELECTRODE REM PT RTRN 9FT ADLT (ELECTROSURGICAL) ×1 IMPLANT
GLOVE BIOGEL PI IND STRL 7.5 (GLOVE) ×1 IMPLANT
GLOVE BIOGEL PI INDICATOR 7.5 (GLOVE) ×2
GLOVE ORTHO TXT STRL SZ7.5 (GLOVE) ×3 IMPLANT
GOWN STRL REUS W/TWL LRG LVL3 (GOWN DISPOSABLE) ×3 IMPLANT
GUIDEPIN 3.2X17.5 THRD DISP (PIN) ×2 IMPLANT
GUIDEWIRE BALL NOSE 80CM (WIRE) ×2 IMPLANT
KIT BASIN OR (CUSTOM PROCEDURE TRAY) ×3 IMPLANT
LIQUID BAND (GAUZE/BANDAGES/DRESSINGS) ×2 IMPLANT
MANIFOLD NEPTUNE II (INSTRUMENTS) ×3 IMPLANT
NAIL TROCH RH 11X32 (Nail) ×2 IMPLANT
PACK GENERAL/GYN (CUSTOM PROCEDURE TRAY) ×3 IMPLANT
POSITIONER SURGICAL ARM (MISCELLANEOUS) ×3 IMPLANT
SCREW ACE CORTICAL (Screw) ×6 IMPLANT
SCREW ACE CORTICAL 6.5X70MML (Screw) ×2 IMPLANT
SCREW ACECAP 44MM (Screw) ×2 IMPLANT
SCREW BN FT 65X6.5XST DRV (Screw) IMPLANT
SCREW BN FT 75X6.5XST DRV (Screw) ×1 IMPLANT
SCREW CANCELLOUS 6.5X90 (Screw) ×3 IMPLANT
SUT MNCRL AB 3-0 PS2 18 (SUTURE) ×3 IMPLANT
SUT MNCRL AB 4-0 PS2 18 (SUTURE) ×2 IMPLANT
SUT VIC AB 1 CT1 27 (SUTURE) ×3
SUT VIC AB 1 CT1 27XBRD ANTBC (SUTURE) ×1 IMPLANT
SUT VIC AB 2-0 CT1 27 (SUTURE) ×6
SUT VIC AB 2-0 CT1 27XBRD (SUTURE) ×2 IMPLANT

## 2015-10-08 NOTE — Progress Notes (Signed)
PROGRESS NOTE    Sarah Mays  [id number] DOB: May 04, 1930 DOA: 10/05/2015 PCP: Hollace Kinnier, DO    Brief Narrative: 82 yo female with history of CAD, diastolic HF, HTN, hypothyroidism, Afib who came with cc of right hip pain that is sudden after she kicked her right leg with her left foot trying to take off her shoes. She has had a dull constant pain in her right thigh/hip for the past couple of weeks that was diagnosed as bursitis as she had that last year and for which she had a corticosteroid injection by her PCP at that time and it helped her. Last week, she had another injection and it did not help her much. She denied any other trauma to her hip/thigh. No open wounds or ulcers. No fever or chills. She had sinusitis for 10 days but she just started antibiotics ( Augmentin) a few days ago. She no longer has cough but feels a little nasal congestion. No chest pain or dyspnea. No N/V/D/C/abd pain/dysuria. Further imaging reviewed noted right femur stress fracture, for orthopedic intervention.    Assessment & Plan:   Active Problems:   Osteomyelitis ([place])  1. Right femur stress fracture. Patient with significant pain on ambulation. Plan for orthopedic intervention today, continue physical therapy evaluation. Osteomyelitis has been ruled out. Continue fentanyl patch  2. Paroxysmal atrial fibrillation. Patient hemodynamic stable, heart rate controlled at 60 to 90 bpm. Continue amiodarone per home regimen. Holding anticoagulation for surgical procedure.  3. Diastolic heart failure. Hemodyamic stable, will continue, diuretic therapy with furosemide.    4. HTN. Continue blood pressure control with hydralazine 50 mg tid. Blood pressure systolic [id number] to [id number].  5, Hypothyroid. Continue levothyroxine per home regimen.   6. Leukocytosis. Suspected to be reactive follow cell count in am.    DVT prophylaxis:  Code Status: DNR Family Communication: No family at the bedside.  Disposition  Plan:   Consultants:   ID  Orthopedics  Procedures:  Antimicrobials:   Subjective: Patient feeling well, no nausea or vomiting, no chest pain or dyspnea. Out of bed with assistance.   Objective: Vitals:   10/07/15 1442 10/07/15 2135 10/07/15 2153 10/08/15 0504  BP: (!) 141/91 (!) 153/59  135/88  Pulse: 99 (!) 53 65 93  Resp: 18 16  16   Temp: 98.4 F (36.9 C) 98.6 F (37 C)  98.2 F (36.8 C)  TempSrc: Axillary Oral  Oral  SpO2: 92% 96%  96%  Weight:      Height:        Intake/Output Summary (Last 24 hours) at 10/08/15 0910 Last data filed at 10/08/15 0851  Gross per 24 hour  Intake              600 ml  Output             2375 ml  Net            -1775 ml   Filed Weights   10/05/15 2129  Weight: 52.6 kg (116 lb)    Examination:  General exam: not in pain or dyspnea E ENT: no conjunctival pallor or iterus Respiratory system: Clear to auscultation. Respiratory effort normal. Cardiovascular system: S1 & S2 heard, RRR. No JVD, murmurs, rubs, gallops or clicks. No pedal edema. Gastrointestinal system: Abdomen is nondistended, soft and nontender. No organomegaly or masses felt. Normal bowel sounds heard. Central nervous system: Alert and oriented. No focal neurological deficits. Extremities: Symmetric 5 x 5 power. Skin: No rashes, lesions or  ulcers   Data Reviewed: I have personally reviewed following labs and imaging studies  CBC:  Recent Labs Lab 10/05/15 1823 10/06/15 0408 10/07/15 0833 10/08/15 0412  WBC 12.7* 12.2* 16.2* 12.4*  NEUTROABS 10.9*  --  14.1*  --   HGB 13.8 14.0 15.0 14.4  HCT 40.9 42.6 44.7 43.3  MCV 113.6* 115.1* 115.2* 113.6*  PLT 486* 524* 507* [id number]   Basic Metabolic Panel:  Recent Labs Lab 10/05/15 1823 10/06/15 0408 10/07/15 0833 10/08/15 0412  NA 139 142 139 141  K 3.8 3.8 3.4* 3.4*  CL 102 101 101 101  CO2 31 33* 32 33*  GLUCOSE 103* 114* 87 86  BUN 35* 32* 20 18  CREATININE 0.80 0.88 0.82 0.80  CALCIUM 10.2 9.9  10.4* 10.0   GFR: Estimated Creatinine Clearance: 38.3 mL/min (by C-G formula based on SCr of 0.8 mg/dL). Liver Function Tests:  Recent Labs Lab 10/06/15 0408  AST 18  ALT 18  ALKPHOS 49  BILITOT 0.6  PROT 6.5  ALBUMIN 4.3   No results for input(s): LIPASE, AMYLASE in the last 168 hours. No results for input(s): AMMONIA in the last 168 hours. Coagulation Profile: No results for input(s): INR, PROTIME in the last 168 hours. Cardiac Enzymes: No results for input(s): CKTOTAL, CKMB, CKMBINDEX, TROPONINI in the last 168 hours. BNP (last 3 results) No results for input(s): PROBNP in the last 8760 hours. HbA1C: No results for input(s): HGBA1C in the last 72 hours. CBG: No results for input(s): GLUCAP in the last 168 hours. Lipid Profile: No results for input(s): CHOL, HDL, LDLCALC, TRIG, CHOLHDL, LDLDIRECT in the last 72 hours. Thyroid Function Tests: No results for input(s): TSH, T4TOTAL, FREET4, T3FREE, THYROIDAB in the last 72 hours. Anemia Panel: No results for input(s): VITAMINB12, FOLATE, FERRITIN, TIBC, IRON, RETICCTPCT in the last 72 hours. Sepsis Labs: No results for input(s): PROCALCITON, LATICACIDVEN in the last 168 hours.  Recent Results (from the past 240 hour(s))  Culture, blood (routine x 2)     Status: None (Preliminary result)   Collection Time: 10/06/15 12:23 AM  Result Value Ref Range Status   Specimen Description BLOOD LEFT ARM  Final   Special Requests BOTTLES DRAWN AEROBIC AND ANAEROBIC 10CC  Final   Culture   Final    NO GROWTH 1 DAY Performed at [facility]    Report Status PENDING  Incomplete  Culture, blood (routine x 2)     Status: None (Preliminary result)   Collection Time: 10/06/15 12:23 AM  Result Value Ref Range Status   Specimen Description BLOOD LEFT ARM  Final   Special Requests BOTTLES DRAWN AEROBIC AND ANAEROBIC 10CC  Final   Culture   Final    NO GROWTH 1 DAY Performed at [facility]    Report Status PENDING   Incomplete  Surgical pcr screen     Status: None   Collection Time: 10/07/15 11:47 AM  Result Value Ref Range Status   MRSA, PCR NEGATIVE NEGATIVE Final   Staphylococcus aureus NEGATIVE NEGATIVE Final    Comment:        The Xpert SA Assay (FDA approved for NASAL specimens in patients over 5 years of age), is one component of a comprehensive surveillance program.  Test performance has been validated by [facility] for patients greater than or equal to 43 year old. It is not intended to diagnose infection nor to guide or monitor treatment.          Radiology  Studies: No results found.      Scheduled Meds: . amiodarone  200 mg Oral Daily  .  ceFAZolin (ANCEF) IV  2 g Intravenous On Call to OR  . chlorhexidine  60 mL Topical Once  . fentaNYL  25 mcg Transdermal Q72H  . furosemide  60 mg Oral Daily  . furosemide  80 mg Oral q morning - 10a  . gabapentin  100 mg Oral QHS  . hydrALAZINE  50 mg Oral TID  . hydroxyurea  500 mg Oral Daily  . levothyroxine  112 mcg Oral QAC breakfast  . magnesium oxide  200 mg Oral Daily  . polycarbophil  1,250 mg Oral BID  . potassium chloride  10 mEq Oral BID  . povidone-iodine  2 application Topical Once  . senna-docusate  2 tablet Oral BID   Continuous Infusions:    LOS: 3 days        Sarah Kedzierski Gerome Apley, MD [facility] Pager [phone]  If 7PM-7AM, please contact night-coverage [email or web address] Password [email or web address] 10/08/2015, 9:10 AM

## 2015-10-08 NOTE — Anesthesia Postprocedure Evaluation (Signed)
Anesthesia Post Note  Patient: Sarah Mays  Procedure(s) Performed: Procedure(s) (LRB): INTRAMEDULLARY (IM) NAIL FEMORAL RIGHT (Right)  Patient location during evaluation: PACU Anesthesia Type: General Level of consciousness: awake and alert Pain management: pain level controlled Vital Signs Assessment: post-procedure vital signs reviewed and stable Respiratory status: spontaneous breathing, nonlabored ventilation, respiratory function stable and patient connected to nasal cannula oxygen Cardiovascular status: blood pressure returned to baseline and stable Postop Assessment: no signs of nausea or vomiting Anesthetic complications: no    Last Vitals:  Vitals:   10/08/15 2113 10/08/15 2120  BP:  (!) 159/62  Pulse: 68 62  Resp: 14 14  Temp: 36.5 C     Last Pain:  Vitals:   10/08/15 2109  TempSrc:   PainSc: Asleep                 Effie Berkshire

## 2015-10-08 NOTE — Anesthesia Preprocedure Evaluation (Addendum)
Anesthesia Evaluation  Patient identified by MRN, date of birth, ID band Patient awake    Reviewed: Allergy & Precautions, NPO status , Patient's Chart, lab work & pertinent test results  Airway Mallampati: II  TM Distance: >3 FB Neck ROM: Full    Dental  (+) Teeth Intact, Dental Advisory Given   Pulmonary former smoker,    breath sounds clear to auscultation       Cardiovascular hypertension, Pt. on medications + CAD and +CHF  + dysrhythmias Atrial Fibrillation  Rhythm:Regular Rate:Normal     Neuro/Psych  Headaches, negative psych ROS   GI/Hepatic negative GI ROS, Neg liver ROS,   Endo/Other  Hypothyroidism   Renal/GU negative Renal ROS  negative genitourinary   Musculoskeletal negative musculoskeletal ROS (+)   Abdominal   Peds negative pediatric ROS (+)  Hematology negative hematology ROS (+)   Anesthesia Other Findings   Reproductive/Obstetrics negative OB ROS                            Lab Results  Component Value Date   WBC 12.4 (H) 10/08/2015   HGB 14.4 10/08/2015   HCT 43.3 10/08/2015   MCV 113.6 (H) 10/08/2015   PLT 470 (H) 10/08/2015   Lab Results  Component Value Date   CREATININE 0.80 10/08/2015   BUN 18 10/08/2015   NA 141 10/08/2015   K 3.4 (L) 10/08/2015   CL 101 10/08/2015   CO2 33 (H) 10/08/2015   Lab Results  Component Value Date   INR 1.17 09/01/2014   INR 2.2 (H) 12/05/2013     08/2015 EKG: normal sinus rhythm.  07/2015 Echo - Left ventricle: The cavity size was normal. Systolic function was   normal. The estimated ejection fraction was in the range of 60%   to 65%. Wall motion was normal; there were no regional wall   motion abnormalities. - Aorta: Ascending aortic diameter: 38 mm (S). - Ascending aorta: The ascending aorta was mildly dilated. - Mitral valve: There was mild regurgitation. - Left atrium: The atrium was mildly dilated. - Pulmonary  arteries: Systolic pressure was mildly increased. PA   peak pressure: 41 mm Hg (S). - Pericardium, extracardiac: A trivial pericardial effusion was   identified posterior to the heart. There was no evidence of   hemodynamic compromise.  Anesthesia Physical Anesthesia Plan  ASA: III  Anesthesia Plan: General   Post-op Pain Management:    Induction: Intravenous  Airway Management Planned: Oral ETT  Additional Equipment:   Intra-op Plan:   Post-operative Plan: Extubation in OR  Informed Consent: I have reviewed the patients History and Physical, chart, labs and discussed the procedure including the risks, benefits and alternatives for the proposed anesthesia with the patient or authorized representative who has indicated his/her understanding and acceptance.   Dental advisory given  Plan Discussed with: CRNA  Anesthesia Plan Comments: (Possible LMA. )        Anesthesia Quick Evaluation

## 2015-10-08 NOTE — Anesthesia Procedure Notes (Signed)
Procedure Name: LMA Insertion Date/Time: 10/08/2015 6:24 PM Performed by: Lajuana Carry E Pre-anesthesia Checklist: Patient identified, Emergency Drugs available, Suction available and Patient being monitored Patient Re-evaluated:Patient Re-evaluated prior to inductionOxygen Delivery Method: Circle system utilized Preoxygenation: Pre-oxygenation with 100% oxygen Intubation Type: IV induction Ventilation: Mask ventilation without difficulty LMA: LMA inserted LMA Size: 4.0 Number of attempts: 2 Placement Confirmation: positive ETCO2 and breath sounds checked- equal and bilateral Tube secured with: Tape Dental Injury: Teeth and Oropharynx as per pre-operative assessment  Comments: First attempt with no co2 detected, lma repositioned w/ etco2 detected, positive chest rise. O2 sats 100% throughout

## 2015-10-08 NOTE — Brief Op Note (Signed)
10/05/2015 - 10/08/2015  6:26 PM  PATIENT:  Sarah Mays  82 y.o. female  PRE-OPERATIVE DIAGNOSIS:  Impending pathologic fracture right proximal femur  POST-OPERATIVE DIAGNOSIS:  Impending pathologic fracture right proximal femur  PROCEDURE:  Procedure(s): INTRAMEDULLARY (IM) NAIL FEMORAL RIGHT (Right)  SURGEON:  Surgeon(s) and Role:    * Paralee Cancel, MD - Primary  PHYSICIAN ASSISTANT: None  ANESTHESIA:   general  EBL:  Total I/O In: 240 [P.O.:240] Out: -   BLOOD ADMINISTERED:none  DRAINS: none   LOCAL MEDICATIONS USED:  NONE  SPECIMEN:  No Specimen  DISPOSITION OF SPECIMEN:  N/A  COUNTS:  YES  TOURNIQUET:  * No tourniquets in log *  DICTATION: .Other Dictation: Dictation Number [id number]  PLAN OF CARE: Admit to inpatient   PATIENT DISPOSITION:  PACU - hemodynamically stable.   Delay start of Pharmacological VTE agent (>24hrs) due to surgical blood loss or risk of bleeding: no

## 2015-10-08 NOTE — Transfer of Care (Signed)
Immediate Anesthesia Transfer of Care Note  Patient: Sarah Mays  Procedure(s) Performed: Procedure(s): INTRAMEDULLARY (IM) NAIL FEMORAL RIGHT (Right)  Patient Location: PACU  Anesthesia Type:General  Level of Consciousness:  sedated, patient cooperative and responds to stimulation  Airway & Oxygen Therapy:Patient Spontanous Breathing and Patient connected to face mask oxgen  Post-op Assessment:  Report given to PACU RN and Post -op Vital signs reviewed and stable  Post vital signs:  Reviewed and stable  Last Vitals:  Vitals:   10/08/15 0504 10/08/15 1404  BP: 135/88 (!) 143/77  Pulse: 93 (!) 48  Resp: 16 16  Temp: 36.8 C [id number] C    Complications: No apparent anesthesia complicationsImmediate Anesthesia Transfer of Care Note  Patient: Sarah Mays  Procedure(s) Performed: Procedure(s): INTRAMEDULLARY (IM) NAIL FEMORAL RIGHT (Right)  Patient Location: PACU  Anesthesia Type:General  Level of Consciousness: awake, oriented, sedated and patient cooperative  Airway & Oxygen Therapy: Patient Spontanous Breathing and Patient connected to face mask oxygen  Post-op Assessment: Report given to RN and Post -op Vital signs reviewed and stable  Post vital signs: Reviewed and stable  Last Vitals:  Vitals:   10/08/15 0504 10/08/15 1404  BP: 135/88 (!) 143/77  Pulse: 93 (!) 48  Resp: 16 16  Temp: 36.8 C 36.8 C    Last Pain:  Vitals:   10/08/15 1404  TempSrc: Oral  PainSc:       Patients Stated Pain Goal: 2 ([id number])  Complications: No apparent anesthesia complications

## 2015-10-08 NOTE — Progress Notes (Signed)
Patient ID: Sarah Mays, female   DOB: [birth year], 82 y.o.   MRN: [id number]  Impending right pathologic proximal femur fracture (atypical pattern related to bone resorption principles of bisphosphonates) Still with pain,   NPO after 9 am Clears until then  Consent on chart  To OR today of IM nail right femur Risks benefits reviewed with patient and daughter  Post op resume Xarelto WBAT

## 2015-10-09 ENCOUNTER — Encounter (HOSPITAL_COMMUNITY): Payer: Self-pay | Admitting: Orthopedic Surgery

## 2015-10-09 DIAGNOSIS — S72001D Fracture of unspecified part of neck of right femur, subsequent encounter for closed fracture with routine healing: Secondary | ICD-10-CM

## 2015-10-09 DIAGNOSIS — I5032 Chronic diastolic (congestive) heart failure: Secondary | ICD-10-CM

## 2015-10-09 LAB — CBC WITH DIFFERENTIAL/PLATELET
BASOS ABS: 0 10*3/uL (ref 0.0–0.1)
Basophils Relative: 0 %
Eosinophils Absolute: 0 10*3/uL (ref 0.0–0.7)
Eosinophils Relative: 0 %
HEMATOCRIT: 41.9 % (ref 36.0–46.0)
Hemoglobin: 13.6 g/dL (ref 12.0–15.0)
LYMPHS ABS: 0.5 10*3/uL — AB (ref 0.7–4.0)
Lymphocytes Relative: 3 %
MCH: 38 pg — ABNORMAL HIGH (ref 26.0–34.0)
MCHC: 32.5 g/dL (ref 30.0–36.0)
MCV: 117 fL — AB (ref 78.0–100.0)
MONO ABS: 0.7 10*3/uL (ref 0.1–1.0)
MONOS PCT: 4 %
NEUTROS ABS: 15.3 10*3/uL — AB (ref 1.7–7.7)
Neutrophils Relative %: 93 %
PLATELETS: 497 10*3/uL — AB (ref 150–400)
RBC: 3.58 MIL/uL — ABNORMAL LOW (ref 3.87–5.11)
RDW: 15.7 % — AB (ref 11.5–15.5)
WBC: 16.5 10*3/uL — ABNORMAL HIGH (ref 4.0–10.5)

## 2015-10-09 LAB — BASIC METABOLIC PANEL
Anion gap: 7 (ref 5–15)
BUN: 20 mg/dL (ref 6–20)
CALCIUM: 9.1 mg/dL (ref 8.9–10.3)
CO2: 29 mmol/L (ref 22–32)
CREATININE: 0.8 mg/dL (ref 0.44–1.00)
Chloride: 103 mmol/L (ref 101–111)
GFR calc non Af Amer: >60 mL/min (ref >60)
GLUCOSE: 134 mg/dL — AB (ref 65–99)
Potassium: 4.2 mmol/L (ref 3.5–5.1)
Sodium: 139 mmol/L (ref 135–145)

## 2015-10-09 MED ORDER — BISACODYL 10 MG RE SUPP
10.0000 mg | Freq: Every day | RECTAL | Status: DC | PRN
  Administered 2015-10-09: 10 mg via RECTAL
  Filled 2015-10-09: qty 1

## 2015-10-09 MED ORDER — HYDROCODONE-ACETAMINOPHEN 5-325 MG PO TABS: 1.0000 | ORAL_TABLET | Freq: Four times a day (QID) | ORAL | 0 refills | Status: DC | PRN

## 2015-10-09 NOTE — Progress Notes (Signed)
Physical Therapy Treatment Patient Details Name: Sarah Mays MRN: [id number] DOB: 1930/07/24 Today's Date: 10/09/2015    History of Present Illness pt was admitted for impending pathological fx on R proximal femur. She is s/p IM nail.  H/O CAD, HF, HTN, A Fib    PT Comments    Pt motivated and progressing with mobility but pain limited.  Follow Up Recommendations  SNF     Equipment Recommendations  None recommended by PT    Recommendations for Other Services OT consult     Precautions / Restrictions Precautions Precautions: Fall Restrictions Weight Bearing Restrictions: No RLE Weight Bearing: Weight bearing as tolerated    Mobility  Bed Mobility Overal bed mobility: Needs Assistance Bed Mobility: Sit to Supine     Supine to sit: Min assist;Mod assist;+2 for physical assistance;+2 for safety/equipment Sit to supine: Min assist;Mod assist   General bed mobility comments: cues for sequence and use of L LE to self assistq  Transfers Overall transfer level: Needs assistance Equipment used: Rolling walker (2 wheeled) Transfers: Sit to/from Stand Sit to Stand: Min assist;Mod assist         General transfer comment: cues for LE management and use of UEs to self assist  Ambulation/Gait Ambulation/Gait assistance: Min assist Ambulation Distance (Feet): 32 Feet Assistive device: Rolling walker (2 wheeled) Gait Pattern/deviations: Step-to pattern;Decreased step length - left;Decreased stance time - right;Shuffle;Trunk flexed Gait velocity: decr Gait velocity interpretation: Below normal speed for age/gender General Gait Details: cues for sequence, posture and position from [employer]            Wheelchair Mobility    Modified Rankin (Stroke Patients Only)       Balance                                    Cognition Arousal/Alertness: Awake/alert Behavior During Therapy: WFL for tasks assessed/performed Overall Cognitive Status:  Within Functional Limits for tasks assessed                      Exercises General Exercises - Lower Extremity Ankle Circles/Pumps: AROM;Both;15 reps;Supine Quad Sets: AROM;Both;5 reps;Supine Heel Slides: AAROM;Right;15 reps;Supine Hip ABduction/ADduction: AAROM;Right;10 reps;Supine    General Comments        Pertinent Vitals/Pain Pain Assessment: 0-10 Pain Score: 6  Pain Location: R hip/thigh with activity/WB Pain Descriptors / Indicators: Aching;Sore Pain Intervention(s): Limited activity within patient's tolerance;Monitored during session;Premedicated before session;Ice applied    Home Living Family/patient expects to be discharged to:: Skilled nursing facility               Additional Comments: rehab setting at [facility]    Prior Function Level of Independence: Independent with assistive device(s)          PT Goals (current goals can now be found in the care plan section) Acute Rehab PT Goals Patient Stated Goal: return to independence PT Goal Formulation: With patient Time For Goal Achievement: 10/16/15 Potential to Achieve Goals: Good Progress towards PT goals: Progressing toward goals    Frequency  Min 5X/week    PT Plan Current plan remains appropriate    Co-evaluation             End of Session Equipment Utilized During Treatment: Gait belt Activity Tolerance: Patient tolerated treatment well Patient left: in bed;with call bell/phone within reach;with nursing/sitter in room     Time:  [id number] PT Time Calculation (min) (ACUTE ONLY): 21 min  Charges:  $Gait Training: 8-22 mins $Therapeutic Exercise: 8-22 mins                    G Codes:      Brinson Tozzi 17-Oct-2015, 3:28 PM

## 2015-10-09 NOTE — Consult Note (Signed)
   [facility] CM Inpatient Consult   10/09/2015  Sarah Mays Jul 08, 1930 [id number]    Patient screened for potential [facility] Care Management services. Chart reviewed. Noted discharge plan is for  SNF.  There are no identifiable [facility] Care Management needs at this time. If patient's post hospital needs change, please place a [facility] Care Management consult. For questions please contact:  Marthenia Rolling, [place], RN,BSN [facility] Liaison [phone]

## 2015-10-09 NOTE — NC FL2 (Signed)
[place] MEDICAID FL2 LEVEL OF CARE SCREENING TOOL     IDENTIFICATION  Patient Name: NIAOMI HODGKINSON Birthdate: [birth year] Sex: female Admission Date (Current Location): 10/05/2015  [facility] and [place] Number:  Herbalist and Address:  [facility],  [address], [place]      Provider Number: [id number]  Attending Physician Name and Address:  Tawni Millers, *  Relative Name and Phone Number:       Current Level of Care: Hospital Recommended Level of Care: [facility] Prior Approval Number:    Date Approved/Denied:   PASRR Number:    Discharge Plan: SNF    Current Diagnoses: Patient Active Problem List   Diagnosis Date Noted  . Osteomyelitis ([place]) 10/05/2015  . Greater trochanteric bursitis of right hip 09/29/2015  . Asymptomatic cholelithiasis 05/09/2015  . Essential hypertension 11/07/2014  . Hair loss 11/07/2014  . Bilateral leg edema 11/07/2014  . Constipation due to pain medication 11/06/2014  . Hypotension due to drugs 11/06/2014  . Chest pain 09/01/2014  . Osteoporosis 01/04/2014  . Gout 01/04/2014  . Headache 01/04/2014  . Hypothyroidism 11/19/2013  . Hypercalcemia 10/11/2013  . Insomnia 05/16/2013  . Lumbar back pain 05/16/2013  . Polycythemia vera ([place])   . Neck mass   . Senile osteoporosis   . Macular degeneration   . Sick sinus syndrome ([place]) 03/09/2012  . CAD (coronary artery disease) 12/27/2011  . Paroxysmal atrial fibrillation ([place]) 12/27/2011  . Chronic diastolic CHF (congestive heart failure) ([place]) 12/27/2011  . HTN (hypertension) 12/27/2011    Orientation RESPIRATION BLADDER Height & Weight     Self, Time, Situation, Place  Normal Continent Weight: 116 lb (52.6 kg) Height:  5' (152.4 cm)  BEHAVIORAL SYMPTOMS/MOOD NEUROLOGICAL BOWEL NUTRITION STATUS  Other (Comment) (no behaviors)   Continent Diet  AMBULATORY STATUS COMMUNICATION OF NEEDS Skin   Extensive Assist Verbally  Surgical wounds                       Personal Care Assistance Level of Assistance  Bathing, Feeding, Dressing Bathing Assistance: Limited assistance Feeding assistance: Independent Dressing Assistance: Limited assistance     Functional Limitations Info  Sight, Hearing, Speech Sight Info: Adequate Hearing Info: Adequate Speech Info: Adequate    SPECIAL CARE FACTORS FREQUENCY  PT (By licensed PT), OT (By licensed OT)     PT Frequency: 5x wk OT Frequency: 5x wk            Contractures Contractures Info: Not present    Additional Factors Info  Code Status Code Status Info: DNR             Current Medications (10/09/2015):  This is the current hospital active medication list Current Facility-Administered Medications  Medication Dose Route Frequency Provider Last Rate Last Dose  . acetaminophen (TYLENOL) tablet 650 mg  650 mg Oral Q6H PRN Paralee Cancel, MD       Or  . acetaminophen (TYLENOL) suppository 650 mg  650 mg Rectal Q6H PRN Paralee Cancel, MD      . aluminum hydroxide (AMPHOJEL/ALTERNAGEL) suspension 15-30 mL  15-30 mL Oral Q4H PRN Paralee Cancel, MD      . amiodarone (PACERONE) tablet 200 mg  200 mg Oral Daily Gennaro Africa, MD   200 mg at 10/09/15 1016  . cholecalciferol (VITAMIN D) tablet 2,000 Units  2,000 Units Oral Daily Paralee Cancel, MD   2,000 Units at 10/09/15 1016  . docusate sodium (  COLACE) capsule 100 mg  100 mg Oral BID Paralee Cancel, MD   100 mg at 10/09/15 1016  . fentaNYL (DURAGESIC - dosed mcg/hr) patch 25 mcg  25 mcg Transdermal Q72H Gennaro Africa, MD   25 mcg at 10/08/15 2326  . ferrous sulfate tablet 325 mg  325 mg Oral TID PC Paralee Cancel, MD   325 mg at 10/09/15 1344  . furosemide (LASIX) tablet 60 mg  60 mg Oral Daily Paralee Cancel, MD   60 mg at 10/07/15 1726  . furosemide (LASIX) tablet 80 mg  80 mg Oral q morning - 10a Paralee Cancel, MD   80 mg at 10/09/15 0744  . gabapentin (NEURONTIN) capsule 100 mg  100 mg Oral QHS Paralee Cancel, MD   100 mg  at 10/08/15 2319  . guaiFENesin (MUCINEX) 12 hr tablet 600 mg  600 mg Oral BID PRN Paralee Cancel, MD      . hydrALAZINE (APRESOLINE) tablet 50 mg  50 mg Oral TID Gennaro Africa, MD   50 mg at 10/08/15 2319  . HYDROcodone-acetaminophen (NORCO/VICODIN) 5-325 MG per tablet 1-2 tablet  1-2 tablet Oral Q6H PRN Paralee Cancel, MD   1 tablet at 10/09/15 1134  . hydroxyurea (HYDREA) capsule 500 mg  500 mg Oral Daily Paralee Cancel, MD   500 mg at 10/09/15 1012  . levothyroxine (SYNTHROID, LEVOTHROID) tablet 112 mcg  112 mcg Oral QAC breakfast Gennaro Africa, MD   112 mcg at 10/09/15 0744  . magnesium oxide (MAG-OX) tablet 200 mg  200 mg Oral Daily Gennaro Africa, MD   200 mg at 10/09/15 1017  . menthol-cetylpyridinium (CEPACOL) lozenge 3 mg  1 lozenge Oral PRN Paralee Cancel, MD       Or  . phenol (CHLORASEPTIC) mouth spray 1 spray  1 spray Mouth/Throat PRN Paralee Cancel, MD      . methocarbamol (ROBAXIN) tablet 500 mg  500 mg Oral Q6H PRN Paralee Cancel, MD       Or  . methocarbamol (ROBAXIN) 500 mg in dextrose 5 % 50 mL IVPB  500 mg Intravenous Q6H PRN Paralee Cancel, MD   500 mg at 10/08/15 2016  . metoCLOPramide (REGLAN) tablet 5-10 mg  5-10 mg Oral Q8H PRN Paralee Cancel, MD       Or  . metoCLOPramide (REGLAN) injection 5-10 mg  5-10 mg Intravenous Q8H PRN Paralee Cancel, MD      . morphine 2 MG/ML injection 0.5 mg  0.5 mg Intravenous Q2H PRN Paralee Cancel, MD      . ondansetron [facility]) tablet 4 mg  4 mg Oral Q6H PRN Paralee Cancel, MD       Or  . ondansetron [facility]) injection 4 mg  4 mg Intravenous Q6H PRN Paralee Cancel, MD      . polycarbophil (FIBERCON) tablet 1,250 mg  1,250 mg Oral BID Bonnell Public, MD   1,250 mg at 10/09/15 1014  . polyethylene glycol (MIRALAX / GLYCOLAX) packet 17 g  17 g Oral Daily PRN Paralee Cancel, MD      . potassium chloride (K-DUR,KLOR-CON) CR tablet 10 mEq  10 mEq Oral BID Paralee Cancel, MD   10 mEq at 10/09/15 1018  . Rivaroxaban (XARELTO) tablet 15 mg  15 mg Oral Q supper Paralee Cancel,  MD      . senna-docusate (Senokot-S) tablet 2 tablet  2 tablet Oral BID Gennaro Africa, MD   2 tablet at 10/09/15 1016  . traMADol (ULTRAM) tablet 50-100 mg  50-100 mg  Oral Q6H PRN Gennaro Africa, MD   100 mg at 10/08/15 [id number]     Discharge Medications: Please see discharge summary for a list of discharge medications.  Relevant Imaging Results:  Relevant Lab Results:   Additional Information SS # [id number]  Mayson Sterbenz, Randall An, LCSW

## 2015-10-09 NOTE — Evaluation (Signed)
Physical Therapy Evaluation Patient Details Name: Sarah Mays MRN: [id number] DOB: [birth year] Today's Date: 10/09/2015   History of Present Illness  pt was admitted for impending pathological fx on R proximal femur. She is s/p IM nail.  H/O CAD, HF, HTN, A Fib  Clinical Impression  Pt admitted as above and presenting with functional mobility limitations 2* decreased R LE strength/ROM and post op pain.  Pt would benefit from follow up rehab at [facility] prior to return to IND living.    Follow Up Recommendations SNF (rehab setting at [facility])    Equipment Recommendations  None recommended by PT    Recommendations for Other Services OT consult     Precautions / Restrictions Precautions Precautions: Fall Restrictions Weight Bearing Restrictions: No RLE Weight Bearing: Weight bearing as tolerated      Mobility  Bed Mobility Overal bed mobility: Needs Assistance Bed Mobility: Supine to Sit     Supine to sit: Min assist;Mod assist;+2 for physical assistance;+2 for safety/equipment     General bed mobility comments: cues for sequence and use of L LE to self assistq  Transfers Overall transfer level: Needs assistance Equipment used: Rolling walker (2 wheeled) Transfers: Sit to/from Stand Sit to Stand: Min assist;Mod assist;+2 safety/equipment;From elevated surface         General transfer comment: cues for LE management and use of UEs to self assist  Ambulation/Gait Ambulation/Gait assistance: Min assist;+2 safety/equipment Ambulation Distance (Feet): 22 Feet Assistive device: Rolling walker (2 wheeled) Gait Pattern/deviations: Step-to pattern;Decreased step length - left;Decreased stance time - right;Shuffle;Trunk flexed Gait velocity: decr Gait velocity interpretation: Below normal speed for age/gender General Gait Details: cues for sequence, posture and position from [employer]            Wheelchair Mobility    Modified Rankin (Stroke  Patients Only)       Balance                                             Pertinent Vitals/Pain Pain Assessment: 0-10 Pain Score: 5  Pain Location: R hip/thigh with activity Pain Descriptors / Indicators: Aching;Sore Pain Intervention(s): Limited activity within patient's tolerance;Monitored during session;Premedicated before session;Ice applied    Home Living Family/patient expects to be discharged to:: Skilled nursing facility                 Additional Comments: rehab setting at [facility]    Prior Function Level of Independence: Independent with assistive device(s)               Hand Dominance        Extremity/Trunk Assessment   Upper Extremity Assessment: Overall WFL for tasks assessed           Lower Extremity Assessment: RLE deficits/detail RLE Deficits / Details: Strength at hip 2/5 with AAROM at hip to 85 flex and 15 abd       Communication   Communication: HOH  Cognition Arousal/Alertness: Awake/alert Behavior During Therapy: WFL for tasks assessed/performed Overall Cognitive Status: Within Functional Limits for tasks assessed                      General Comments      Exercises General Exercises - Lower Extremity Ankle Circles/Pumps: AROM;Both;15 reps;Supine Quad Sets: AROM;Both;5 reps;Supine Heel Slides: AAROM;Right;15 reps;Supine Hip ABduction/ADduction: AAROM;Right;10 reps;Supine  Assessment/Plan    PT Assessment Patient needs continued PT services  PT Diagnosis Difficulty walking   PT Problem List Decreased strength;Decreased range of motion;Decreased activity tolerance;Decreased balance;Decreased mobility;Decreased knowledge of use of DME;Pain  PT Treatment Interventions DME instruction;Gait training;Stair training;Functional mobility training;Therapeutic activities;Therapeutic exercise;Patient/family education   PT Goals (Current goals can be found in the Care Plan section) Acute Rehab PT  Goals Patient Stated Goal: return to independence PT Goal Formulation: With patient Time For Goal Achievement: 10/16/15 Potential to Achieve Goals: Good    Frequency Min 5X/week   Barriers to discharge        Co-evaluation               End of Session Equipment Utilized During Treatment: Gait belt Activity Tolerance: Patient tolerated treatment well Patient left: in chair;with call bell/phone within reach;with chair alarm set Nurse Communication: Mobility status         Time: 1220-1248 PT Time Calculation (min) (ACUTE ONLY): 28 min   Charges:   PT Evaluation $PT Eval Low Complexity: 1 Procedure PT Treatments $Therapeutic Exercise: 8-22 mins   PT G Codes:        Junaid Wurzer 14-Oct-2015, 1:02 PM

## 2015-10-09 NOTE — Progress Notes (Signed)
At this time there are no further needs from ID standpoint. Will sign off. Recommend to follow up with her PCP within next 2 wks as part of routine follow up post hospitalization.

## 2015-10-09 NOTE — Care Management Note (Signed)
Case Management Note  Patient Details  Name: Sarah Mays MRN: [id number] Date of Birth: 1930-08-18  Subjective/Objective:  82 y/o f admitted w/R pathological fracture, s/p IM nail R femur. From Indep living-[facility]. OT-SNF. Await PT recc. CSW already following for rehab.                  Action/Plan:d/c plan SNF.   Expected Discharge Date:                  Expected Discharge Plan:  Skilled Nursing Facility  In-House Referral:  Clinical Social Work  Discharge planning Services  CM Consult  Post Acute Care Choice:    Choice offered to:     DME Arranged:    DME Agency:     HH Arranged:    [place] Agency:     Status of Service:  In process, will continue to follow  If discussed at Long Length of Stay Meetings, dates discussed:    Additional Comments:  Dessa Phi, RN 10/09/2015, 11:48 AM

## 2015-10-09 NOTE — Op Note (Signed)
NAMEOTERIA, HOSKINS NO.:  [id number]  MEDICAL RECORD NO.:  [id number]  LOCATION:  16                         FACILITY:  [facility]  PHYSICIAN:  Pietro Cassis. Alvan Dame, M.D.  DATE OF BIRTH:  06-21-1930  DATE OF PROCEDURE:  10/08/2015 DATE OF DISCHARGE:                              OPERATIVE REPORT   PREOPERATIVE DIAGNOSIS:  Impending pathologic fracture of the right proximal third femur.  POSTOPERATIVE DIAGNOSIS:  Impending pathologic fracture of the right proximal third femur.  FINDINGS:  The patient was noted to have atypical stress reaction at the lateral proximal aspect of her proximal femur in the proximity of lesser trochanter laterally.  PROCEDURE:  Prophylactic intramedullary nailing of the right femur utilizing a [employer] VersaNail with a size 11 x 320-mm nail, a cross- pattern of screws proximally and a single distal interlock screw distally.  SURGEON:  Pietro Cassis. Alvan Dame, M.D.  ASSISTANT:  Surgical team.  ANESTHESIA:  General.  SPECIMENS:  None.  COMPLICATIONS:  None.  BLOOD LOSS:  Probably about 150 mL.  INDICATIONS FOR PROCEDURE:  Ms. Baird is a pleasant 82 year old female who in a normal state health until recently.  She had reported bumping her thigh.  She was seen and evaluated in the emergency room and admitted to the medical service where radiographs had indicated there is a change in the bone proximally.  Then, MRI was subsequently ordered initially read out for concern for osteomyelitis.  Initially, Infectious Disease was consulted; however, they were reviewed and had no evidence of clinical presentation nor wounds that would be concerning for an osteomyelitis presentation. Orthopedics was subsequently consulted.  Based on location of her radiographic evidence of her fracture site, she had evidence of stress reaction consistent with an atypical pattern associated with bisphosphonates.  After reviewing with the amount of discomfort  she was having and weightbearing, the concern for potential fracture and location, we discussed prophylactic nailing of the femur.  Risks, benefits and necessity of the procedure were discussed and reviewed.  Consent was obtained for benefit of pain relief and functional improvement.  PROCEDURE IN DETAIL:  The patient was brought to the operative theater. Once adequate anesthesia, preoperative antibiotics, Ancef administered, she was positioned supine on the fracture table.  Her unaffected left lower extremity was flexed and abducted out of the way with bony prominences padded.  Perineal post was applied.  The right foot was placed in a traction boot.  Gentle traction was applied for stability of the lower extremity.  Fluoroscopy was at this point, brought into the field to identify landmarks.  Once this was confirmed, the right hip was prepped and draped in sterile fashion using shower curtain technique to allow for distal interlock position.  The time-out was performed identifying the patient, planned procedure and extremity.  Fluoroscopy was brought back to the field and the tip of the trochanter identified.  Incision was made proximal to the trochanter through the gluteal fascia.  The guidewire was then inserted in the proximal femur through the tip of the femur.  The proximal femur was then opened with a drill.  I then passed a ball-tipped guidewire to the knee and measured and selected a 320-mm nail.  At this point, I placed a ball-tipped guidewire to the distal physis of the femur and reamed first with a 10-mm up to a 12.5-mm reamer and I selected an 11-mm diameter nail.  The right 11 x 320-mm VersaNail was opened and placed onto the insertion jig.  The nail was then passed by hand over the guidewire to appropriate depth.  The guidewire was removed.  At this point, based on bone quality, I placed one screw into the femoral head through the center of the neck as well as  one from the greater to the lesser trochanter.  These two screws were placed and confirmed in location, AP and lateral planes radiographically.  Following this, the traction was let off the leg.  The leg was abducted and under fluoroscopic perfect circle technique, a distal interlock was placed.  Final radiographs were obtained in AP and lateral planes.  I irrigated all wounds.  The proximal wound was closed in layers using #1 Vicryl and then 2-0 Vicryl and 3-0 running Monocryl.  The other two incisions were made for the interlocks were closed with 2-0 Vicryl.  The wounds were cleaned, dried and dressed sterilely with surgical glue and Aquacel dressing.  She was then brought to the recovery room, extubated in stable condition tolerating the procedure well.  Findings were reviewed with the family. At this point, we will allow her to be weightbearing as tolerated with physical therapy.  Disposition, depending on her ability to function with therapy.     Pietro Cassis Alvan Dame, M.D.     MDO/MEDQ  D:  10/08/2015  T:  10/09/2015  Job:  [id number]

## 2015-10-09 NOTE — Evaluation (Signed)
Occupational Therapy Evaluation Patient Details Name: Sarah Mays MRN: [id number] DOB: 1930-12-18 Today's Date: 10/09/2015    History of Present Illness pt was admitted for impending pathological fx on R proximal femur. She is s/p IM nail.  H/O CAD, HF, HTN, A Fib   Clinical Impression   This 82 year old female was admitted for the above. She will benefit from continued OT to increase safety and independence with adls.  Pt used walker at baseline to help allieviate back pain.  Goals in acute are for min guard to min A overall    Follow Up Recommendations  SNF    Equipment Recommendations  3 in 1 bedside comode    Recommendations for Other Services       Precautions / Restrictions Precautions Precautions: Fall Restrictions Weight Bearing Restrictions: No RLE Weight Bearing: Weight bearing as tolerated      Mobility Bed Mobility Overal bed mobility: Needs Assistance Bed Mobility: Supine to Sit     Supine to sit: Mod assist;HOB elevated Sit to supine: Mod assist;+2 for safety/equipment   General bed mobility comments: kept HOB raised as pt c/o back pain  Transfers                 General transfer comment: not tested    Balance                                            ADL Overall ADL's : Needs assistance/impaired     Grooming: Wash/dry face;Set up;Sitting   Upper Body Bathing: Set up;Sitting   Lower Body Bathing: Maximal assistance (only sat EOB during evaluation)   Upper Body Dressing : Minimal assistance;Sitting (lines)   Lower Body Dressing: Total assistance (EOB)                 General ADL Comments: pt sat eob and performed grooming.  She was uncomfortable with catheter and did not feel up to getting to chair at this time.  Pain increased with lying back down but improved when repositioned     Vision     Perception     Praxis      Pertinent Vitals/Pain Pain Assessment: 0-10 (3-8) Pain Location: R  hip Pain Descriptors / Indicators: Aching Pain Intervention(s): Limited activity within patient's tolerance;Monitored during session;Premedicated before session;Repositioned;Ice applied     Hand Dominance     Extremity/Trunk Assessment Upper Extremity Assessment Upper Extremity Assessment: Overall WFL for tasks assessed           Communication Communication Communication: HOH (L ear better than R)   Cognition Arousal/Alertness: Awake/alert Behavior During Therapy: WFL for tasks assessed/performed Overall Cognitive Status: Within Functional Limits for tasks assessed                     General Comments       Exercises       Shoulder Instructions      Home Living Family/patient expects to be discharged to::  (I living at [facility])                                        Prior Functioning/Environment Level of Independence: Independent with assistive device(s)        Comments:  (has been using walker)  OT Diagnosis: Acute pain   OT Problem List: Decreased strength;Decreased activity tolerance;Pain;Decreased knowledge of use of DME or AE   OT Treatment/Interventions: Self-care/ADL training;DME and/or AE instruction;Patient/family education    OT Goals(Current goals can be found in the care plan section) Acute Rehab OT Goals Patient Stated Goal: return to independence OT Goal Formulation: With patient Time For Goal Achievement: 10/16/15 Potential to Achieve Goals: Good ADL Goals Pt Will Perform Grooming: with min guard assist;standing Pt Will Perform Lower Body Bathing: with min assist;with adaptive equipment;sit to/from stand Pt Will Transfer to Toilet: with min assist;ambulating;bedside commode Additional ADL Goal #1: pt will perform bed mobility at min A level in preparation for adls  OT Frequency: Min 2X/week   Barriers to D/C:            Co-evaluation              End of Session    Activity Tolerance: Patient  tolerated treatment well Patient left: in bed;with call bell/phone within reach   Time: 0805-0830 OT Time Calculation (min): 25 min Charges:  OT General Charges $OT Visit: 1 Procedure OT Evaluation $OT Eval Low Complexity: 1 Procedure G-Codes:    Eugina Row 11/04/2015, 9:12 AM  Lesle Chris, OTR/L [phone] 04-Nov-2015

## 2015-10-09 NOTE — Progress Notes (Signed)
PROGRESS NOTE    Sarah Mays  [id number] DOB: 1930/05/10 DOA: 10/05/2015 PCP: Hollace Kinnier, DO   Brief Narrative: 82 yo female with history of CAD, diastolic HF, HTN, hypothyroidism, Afib who came with cc of right hip pain that is sudden after she kicked her right leg with her left foot trying to take off her shoes. She has had a dull constant pain in her right thigh/hip for the past couple of weeks that was diagnosed as bursitis. Initially suspected osteomyelitis but further imaging reviewed and noted right femur stress fracture, impending pathologic fracture, sp  Intramedullary nail placement.   Assessment & Plan:   Active Problems:   Osteomyelitis ([place])  1. Right femur stress fracture.  Patient tolerated procedure well, will continue pain control with fentanyl patch, morphine and hydrocodone, dvt prophylaxis and physical therapy evaluation. Follow on orthopedic recommendations. Social services for discharge planing.  2. Paroxysmal atrial fibrillation. Patient hemodynamic stable, heart rate controlled between 50 to 60 bpm. Continue amiodarone 200 mg daily. Resumed anticoagulation with Rivaroxaban.  3. Diastolic heart failure. Hemodyamic stable, will continue, diuretic therapy with furosemide. Will hold on IV fluids, continue rate control for atrial fibrillation.    4. HTN. Blood pressure control with  hydralazine 50 mg tid. Blood pressure systolic [id number] to [id number].  5, Hypothyroid. Continue levothyroxine per home regimen.   6. Leukocytosis. Worsening leukocytosis at 16 from 12, assumed to be reactive, follow cell count in am.   7. Iron deficiency anemia. Continue ferrous sulfate. Hb stable at 13.    DVT prophylaxis:  Code Status: DNR Family Communication: No family at the bedside.  Disposition Plan:    Consultants:   Orthopedics  I.D   Procedures:   Right femur intramedullary nail (08/30)   Antimicrobials:       Subjective: Patient feeling well, no chest  pain or dyspnea, pain on the right hip region improved. No nausea or vomiting, tolerating po well.   Objective: Vitals:   10/08/15 2220 10/08/15 2320 10/09/15 0030 10/09/15 0446  BP: (!) 145/65 (!) 119/52 131/61 (!) 113/54  Pulse: (!) 58 (!) 51 60 (!) 51  Resp: 15 15 15 15   Temp: 97.7 F (36.5 C) 98 F (36.7 C) 97.7 F (36.5 C) 98.3 F (36.8 C)  TempSrc: Oral Oral Oral Oral  SpO2: 100% 97% 98% 99%  Weight:      Height:        Intake/Output Summary (Last 24 hours) at 10/09/15 0920 Last data filed at 10/09/15 0800  Gross per 24 hour  Intake             1230 ml  Output             1550 ml  Net             -320 ml   Filed Weights   10/05/15 2129  Weight: 52.6 kg (116 lb)    Examination:  General exam: Not in pain or dyspnea E ENT: no conjunctival pallor, oral mucosa moist.  Respiratory system: Clear to auscultation. Respiratory effort normal. No wheezing, rales or rhonchi. Cardiovascular system: S1 & S2 heard, RRR. No JVD, murmurs, rubs, gallops or clicks. No pedal edema. Gastrointestinal system: Abdomen is nondistended, soft and nontender. No organomegaly or masses felt. Normal bowel sounds heard. Central nervous system: Alert and oriented. No focal neurological deficits. Extremities: Symmetric 5 x 5 power. Skin: No rashes, lesions or ulcers Psychiatry: Judgement and insight appear normal. Mood & affect appropriate.  Data Reviewed: I have personally reviewed following labs and imaging studies  CBC:  Recent Labs Lab 10/05/15 1823 10/06/15 0408 10/07/15 0833 10/08/15 0412 10/09/15 0416  WBC 12.7* 12.2* 16.2* 12.4* 16.5*  NEUTROABS 10.9*  --  14.1*  --  15.3*  HGB 13.8 14.0 15.0 14.4 13.6  HCT 40.9 42.6 44.7 43.3 41.9  MCV 113.6* 115.1* 115.2* 113.6* 117.0*  PLT 486* 524* 507* 470* [id number]*   Basic Metabolic Panel:  Recent Labs Lab 10/05/15 1823 10/06/15 0408 10/07/15 0833 10/08/15 0412 10/09/15 0416  NA 139 142 139 141 139  K 3.8 3.8 3.4* 3.4* 4.2    CL 102 101 101 101 103  CO2 31 33* 32 33* 29  GLUCOSE 103* 114* 87 86 134*  BUN 35* 32* 20 18 20   CREATININE 0.80 0.88 0.82 0.80 0.80  CALCIUM 10.2 9.9 10.4* 10.0 9.1   GFR: Estimated Creatinine Clearance: 38.3 mL/min (by C-G formula based on SCr of 0.8 mg/dL). Liver Function Tests:  Recent Labs Lab 10/06/15 0408  AST 18  ALT 18  ALKPHOS 49  BILITOT 0.6  PROT 6.5  ALBUMIN 4.3   No results for input(s): LIPASE, AMYLASE in the last 168 hours. No results for input(s): AMMONIA in the last 168 hours. Coagulation Profile: No results for input(s): INR, PROTIME in the last 168 hours. Cardiac Enzymes: No results for input(s): CKTOTAL, CKMB, CKMBINDEX, TROPONINI in the last 168 hours. BNP (last 3 results) No results for input(s): PROBNP in the last 8760 hours. HbA1C: No results for input(s): HGBA1C in the last 72 hours. CBG: No results for input(s): GLUCAP in the last 168 hours. Lipid Profile: No results for input(s): CHOL, HDL, LDLCALC, TRIG, CHOLHDL, LDLDIRECT in the last 72 hours. Thyroid Function Tests: No results for input(s): TSH, T4TOTAL, FREET4, T3FREE, THYROIDAB in the last 72 hours. Anemia Panel: No results for input(s): VITAMINB12, FOLATE, FERRITIN, TIBC, IRON, RETICCTPCT in the last 72 hours. Sepsis Labs: No results for input(s): PROCALCITON, LATICACIDVEN in the last 168 hours.  Recent Results (from the past 240 hour(s))  Culture, blood (routine x 2)     Status: None (Preliminary result)   Collection Time: 10/06/15 12:23 AM  Result Value Ref Range Status   Specimen Description BLOOD LEFT ARM  Final   Special Requests BOTTLES DRAWN AEROBIC AND ANAEROBIC 10CC  Final   Culture   Final    NO GROWTH 2 DAYS Performed at [facility]    Report Status PENDING  Incomplete  Culture, blood (routine x 2)     Status: None (Preliminary result)   Collection Time: 10/06/15 12:23 AM  Result Value Ref Range Status   Specimen Description BLOOD LEFT ARM  Final    Special Requests BOTTLES DRAWN AEROBIC AND ANAEROBIC 10CC  Final   Culture   Final    NO GROWTH 2 DAYS Performed at [facility]    Report Status PENDING  Incomplete  Surgical pcr screen     Status: None   Collection Time: 10/07/15 11:47 AM  Result Value Ref Range Status   MRSA, PCR NEGATIVE NEGATIVE Final   Staphylococcus aureus NEGATIVE NEGATIVE Final    Comment:        The Xpert SA Assay (FDA approved for NASAL specimens in patients over 36 years of age), is one component of a comprehensive surveillance program.  Test performance has been validated by [facility] for patients greater than or equal to 93 year old. It is not intended to diagnose infection  nor to guide or monitor treatment.          Radiology Studies: Dg Femur, Min 2 Views Right  Result Date: 10/08/2015 CLINICAL DATA:  Fracture EXAM: RIGHT FEMUR 2 VIEWS COMPARISON:  10/05/2015 FINDINGS: An intra medullary rod as well as stabilization screws in the femoral neck, intertrochanteric region, and distal femur are noted. Proximal femur lateral stress fracture is noted. No breakage or loosening of the hardware. Anatomic alignment. IMPRESSION: ORIF femur as described. Electronically Signed   By: Marybelle Killings M.D.   On: 10/08/2015 22:18        Scheduled Meds: . amiodarone  200 mg Oral Daily  . cholecalciferol  2,000 Units Oral Daily  . docusate sodium  100 mg Oral BID  . fentaNYL  25 mcg Transdermal Q72H  . ferrous sulfate  325 mg Oral TID PC  . furosemide  60 mg Oral Daily  . furosemide  80 mg Oral q morning - 10a  . gabapentin  100 mg Oral QHS  . hydrALAZINE  50 mg Oral TID  . hydroxyurea  500 mg Oral Daily  . levothyroxine  112 mcg Oral QAC breakfast  . magnesium oxide  200 mg Oral Daily  . polycarbophil  1,250 mg Oral BID  . potassium chloride  10 mEq Oral BID  . rivaroxaban  15 mg Oral Q supper  . senna-docusate  2 tablet Oral BID   Continuous Infusions: . sodium chloride 50 mL/hr at  10/08/15 2318     LOS: 4 days      Tawni Millers, MD [facility] Pager [phone]  If 7PM-7AM, please contact night-coverage [email or web address] Password [email or web address] 10/09/2015, 9:20 AM

## 2015-10-09 NOTE — Care Management Important Message (Signed)
Important Message  Patient Details  Name: Sarah Mays MRN: [id number] Date of Birth: March 04, 1930   Medicare Important Message Given:  Yes    Camillo Flaming 10/09/2015, 10:23 AMImportant Message  Patient Details  Name: Sarah Mays MRN: [id number] Date of Birth: 02-Apr-1930   Medicare Important Message Given:  Yes    Camillo Flaming 10/09/2015, 10:23 AM

## 2015-10-10 DIAGNOSIS — I10 Essential (primary) hypertension: Secondary | ICD-10-CM

## 2015-10-10 DIAGNOSIS — M25551 Pain in right hip: Secondary | ICD-10-CM | POA: Diagnosis not present

## 2015-10-10 DIAGNOSIS — M79604 Pain in right leg: Secondary | ICD-10-CM | POA: Diagnosis not present

## 2015-10-10 DIAGNOSIS — M62561 Muscle wasting and atrophy, not elsewhere classified, right lower leg: Secondary | ICD-10-CM | POA: Diagnosis not present

## 2015-10-10 DIAGNOSIS — S72001A Fracture of unspecified part of neck of right femur, initial encounter for closed fracture: Secondary | ICD-10-CM

## 2015-10-10 DIAGNOSIS — I480 Paroxysmal atrial fibrillation: Secondary | ICD-10-CM

## 2015-10-10 DIAGNOSIS — M629 Disorder of muscle, unspecified: Secondary | ICD-10-CM | POA: Diagnosis not present

## 2015-10-10 DIAGNOSIS — R278 Other lack of coordination: Secondary | ICD-10-CM | POA: Diagnosis not present

## 2015-10-10 DIAGNOSIS — R2689 Other abnormalities of gait and mobility: Secondary | ICD-10-CM | POA: Diagnosis not present

## 2015-10-10 LAB — BASIC METABOLIC PANEL
ANION GAP: 6 (ref 5–15)
BUN: 27 mg/dL — ABNORMAL HIGH (ref 6–20)
CALCIUM: 9.4 mg/dL (ref 8.9–10.3)
CHLORIDE: 103 mmol/L (ref 101–111)
CO2: 26 mmol/L (ref 22–32)
CREATININE: 0.83 mg/dL (ref 0.44–1.00)
GFR calc non Af Amer: >60 mL/min (ref >60)
Glucose, Bld: 108 mg/dL — ABNORMAL HIGH (ref 65–99)
Potassium: 4.4 mmol/L (ref 3.5–5.1)
SODIUM: 135 mmol/L (ref 135–145)

## 2015-10-10 LAB — CBC WITH DIFFERENTIAL/PLATELET
Basophils Absolute: 0 10*3/uL (ref 0.0–0.1)
Basophils Relative: 0 %
EOS ABS: 0.1 10*3/uL (ref 0.0–0.7)
EOS PCT: 1 %
HCT: 37.8 % (ref 36.0–46.0)
HEMOGLOBIN: 12.4 g/dL (ref 12.0–15.0)
LYMPHS PCT: 9 %
Lymphs Abs: 1.2 10*3/uL (ref 0.7–4.0)
MCH: 38.2 pg — ABNORMAL HIGH (ref 26.0–34.0)
MCHC: 32.8 g/dL (ref 30.0–36.0)
MCV: 116.3 fL — ABNORMAL HIGH (ref 78.0–100.0)
MONO ABS: 1.4 10*3/uL — AB (ref 0.1–1.0)
Monocytes Relative: 11 %
NEUTROS PCT: 79 %
Neutro Abs: 10.3 10*3/uL — ABNORMAL HIGH (ref 1.7–7.7)
PLATELETS: 441 10*3/uL — AB (ref 150–400)
RBC: 3.25 MIL/uL — AB (ref 3.87–5.11)
RDW: 15.8 % — ABNORMAL HIGH (ref 11.5–15.5)
WBC: 13 10*3/uL — AB (ref 4.0–10.5)

## 2015-10-10 MED ORDER — CALCIUM POLYCARBOPHIL 625 MG PO TABS: 1250.0000 mg | ORAL_TABLET | Freq: Two times a day (BID) | ORAL | 0 refills | Status: DC

## 2015-10-10 MED ORDER — ALUMINUM HYDROXIDE GEL 320 MG/5ML PO SUSP: 15.0000 mL | ORAL | 0 refills | Status: DC | PRN

## 2015-10-10 MED ORDER — POTASSIUM CHLORIDE CRYS ER 10 MEQ PO TBCR: 10.0000 meq | EXTENDED_RELEASE_TABLET | Freq: Two times a day (BID) | ORAL | 0 refills | Status: DC

## 2015-10-10 MED ORDER — HYDRALAZINE HCL 50 MG PO TABS: 50.0000 mg | ORAL_TABLET | Freq: Three times a day (TID) | ORAL | 0 refills | Status: DC

## 2015-10-10 MED ORDER — FLEET ENEMA 7-19 GM/118ML RE ENEM
1.0000 | ENEMA | Freq: Once | RECTAL | Status: AC
Start: 2015-10-10 — End: 2015-10-10
  Administered 2015-10-10: 1 via RECTAL
  Filled 2015-10-10 (×2): qty 1

## 2015-10-10 MED ORDER — ACETAMINOPHEN 325 MG PO TABS: 650.0000 mg | ORAL_TABLET | Freq: Three times a day (TID) | ORAL | 0 refills | Status: DC | PRN

## 2015-10-10 MED ORDER — FENTANYL 25 MCG/HR TD PT72: 25.0000 ug | MEDICATED_PATCH | TRANSDERMAL | 0 refills | Status: DC

## 2015-10-10 MED ORDER — DOCUSATE SODIUM 100 MG PO CAPS: 100.0000 mg | ORAL_CAPSULE | Freq: Two times a day (BID) | ORAL | 0 refills | Status: DC

## 2015-10-10 MED ORDER — BISACODYL 10 MG RE SUPP: 10.0000 mg | Freq: Every day | RECTAL | 0 refills | Status: DC | PRN

## 2015-10-10 NOTE — Discharge Summary (Signed)
Physician Discharge Summary  Sarah Mays [id number] DOB: [birth year] DOA: 10/05/2015  PCP: Hollace Kinnier, DO  Admit date: 10/05/2015 Discharge date: 10/10/2015  Admitted From: Home Disposition:  SNF  Recommendations for Outpatient Follow-up:  1. Follow up with PCP in 1-2 weeks 2.         Please obtain BMP in 7 days  Home Health: NA Equipment/Devices: NA  Discharge Condition: Stable CODE STATUS: DNR Diet recommendation: Heart healthy  Brief/Interim Summary: This is a 82 year old female who comes to the hospital with the chief complaint of right hip pain. The pain occurred suddenly after she kicked her right leg with her left foot trying to take off her shoes. The pain had been constant, dull in nature for about 2 weeks, unresponsive to outpatient medical therapy. Her initial physical examination she was afebrile, blood pressure 150/77, heart rate 65, respiratory rate 14. Oral mucosa was moist, neck was supple, lungs were clear to auscultation bilaterally, heart S1-S2 present, abdomen was soft and nontender, lower extremities had no significant deformity, strength was preserved. Her serum sodium was 139, potassium 3.8, creatinine 0.80, BUN 35, glucose 103, white count 12.7, hemoglobin 13.8, hematocrit 40.9, platelet count 486. Patient had a MRI showing abnormal signal in the proximal right femoral shaft with small focal cortical defect and surrounding soft tissue infiltration, suggestive of osteomyelitis with sinus tract.   Patient was admitted to the hospital with working diagnosis of right hip pain possibly related to osteomyelitis acute/subacute.   1. Proximal femoral diaphysis stress fracture. Clinical picture was reviewed as well as images from MRI, infection disease was consulted, osteomyelitis was ruled out, patient was diagnosed with a stress fracture. Patient was evaluated with orthopedics, concluded the patient had stress fracture with impending fracture risk, she underwent a  prophylactic intramedullary nailing of the right femur. She tolerated well physical therapy, with recommendations for skilled nursing facility for further rehabilitation. Patient's leukocytosis was presumed to be reactive  2. Paroxysmal atrial fibrillation. Patient was continued on amiodarone, she has been placed on anticoagulation with rivaroxaban. She remained rate controlled.  3. Hypertension. She was continued on hydralazine 50 mg 3 times daily, instructions to hold on antihypertensive agents in case of blood pressure less than 140/90. Patient was continued on furosemide per her home regimen. Her echocardiogram has ejection fraction of 60% of the left ventricle documented in June 2017.   4. Hypothyroid. Patient was continued on levothyroxine.   5. Polycythemia. Patient was continued on hydroxyurea  6. Constipation. She was continued on bowel regimen.  Discharge Diagnoses:  Active Problems:   Osteomyelitis [facility])    Discharge Instructions  Discharge Instructions    Diet - low sodium heart healthy    Complete by:  As directed   Discharge instructions    Complete by:  As directed   Follow with primary care in 7 days.   Increase activity slowly    Complete by:  As directed   Weight bearing as tolerated    Complete by:  As directed   Laterality:  right   Extremity:  Lower       Medication List    STOP taking these medications   acetaminophen 650 MG CR tablet Commonly known as:  TYLENOL Replaced by:  acetaminophen 325 MG tablet   amoxicillin-clavulanate 875-125 MG tablet Commonly known as:  AUGMENTIN   senna-docusate 8.6-50 MG tablet Commonly known as:  Senokot-S   traMADol 50 MG tablet Commonly known as:  ULTRAM     TAKE these medications  acetaminophen 325 MG tablet Commonly known as:  TYLENOL Take 2 tablets (650 mg total) by mouth every 8 (eight) hours as needed for mild pain (or Fever >/= 101). Replaces:  acetaminophen 650 MG CR tablet   aluminum hydroxide 320  MG/5ML suspension Commonly known as:  AMPHOJEL/ALTERNAGEL Take 15-30 mLs by mouth every 4 (four) hours as needed for indigestion.   amiodarone 200 MG tablet Commonly known as:  PACERONE Take 200 mg by mouth daily.   bisacodyl 10 MG suppository Commonly known as:  DULCOLAX Place 1 suppository (10 mg total) rectally daily as needed for moderate constipation.   cholecalciferol 1000 units tablet Commonly known as:  VITAMIN D Take 2,000 Units by mouth daily.   docusate sodium 100 MG capsule Commonly known as:  COLACE Take 1 capsule (100 mg total) by mouth 2 (two) times daily.   fentaNYL 25 MCG/HR patch Commonly known as:  DURAGESIC - dosed mcg/hr Place 1 patch (25 mcg total) onto the skin every 3 (three) days.   furosemide 80 MG tablet Commonly known as:  LASIX Take 80 mg by mouth every morning.   furosemide 40 MG tablet Commonly known as:  LASIX Take 60 mg by mouth at bedtime.   gabapentin 100 MG capsule Commonly known as:  NEURONTIN Take 100 mg by mouth at bedtime.   guaiFENesin 600 MG 12 hr tablet Commonly known as:  MUCINEX Take 600 mg by mouth 2 (two) times daily as needed for cough or to loosen phlegm.   hydrALAZINE 50 MG tablet Commonly known as:  APRESOLINE Take 1 tablet (50 mg total) by mouth 3 (three) times daily. Please hold if blood pressure less than 140/90 What changed:  additional instructions   HYDROcodone-acetaminophen 5-325 MG tablet Commonly known as:  NORCO/VICODIN Take 1-2 tablets by mouth every 6 (six) hours as needed for moderate pain.   hydroxyurea 500 MG capsule Commonly known as:  HYDREA TAKE 2 CAPSULES ON MONDAYS AND TAKE 1 CAPSULE ALL OTHER DAYS OF THE WEEK. What changed:  Another medication with the same name was removed. Continue taking this medication, and follow the directions you see here.   levothyroxine 112 MCG tablet Commonly known as:  SYNTHROID, LEVOTHROID Take 112 mcg by mouth daily before breakfast.   Magnesium 250 MG  Tabs Take 250 mg by mouth daily.   Melatonin 5 MG Caps Take 5 mg by mouth at bedtime.   polycarbophil 625 MG tablet Commonly known as:  FIBERCON Take 2 tablets (1,250 mg total) by mouth 2 (two) times daily.   potassium chloride 10 MEQ tablet Commonly known as:  K-DUR,KLOR-CON Take 1 tablet (10 mEq total) by mouth 2 (two) times daily.   PRESERVISION AREDS 2 Caps Take 1 capsule by mouth 2 (two) times daily.   Rivaroxaban 15 MG Tabs tablet Commonly known as:  XARELTO Take 15 mg by mouth daily with supper.       Contact information for follow-up providers    Mauri Pole, MD. Schedule an appointment as soon as possible for a visit in 2 week(s).   Specialty:  Orthopedic Surgery Contact information: [address] [place] [address] [phone]        REED, TIFFANY, DO Follow up in 1 week(s).   Specialty:  Geriatric Medicine Contact information: [place]. [place] [address] [phone]            Contact information for after-discharge care    Destination    [facility] .  Specialties:  [place] Contact information: [place] [phone]                 Allergies  Allergen Reactions  . Amlodipine Swelling and Other (See Comments)    Reaction:  Lower extremity swelling     Consultations:  Infection disease   orthopedics    Procedures/Studies: Mr Hip Right W Wo Contrast  Addendum Date: 10/06/2015   ADDENDUM REPORT: 10/06/2015 16:41 ADDENDUM: The original report was by Dr. Viona Gilmore. Leonidas Romberg. The following addendum is by Dr. Van Clines: I was phoned by Dr. Baxter Flattery in Infectious Disease at about 4:15 p.m. on 10/06/2015 to review this case which was interpreted last night on-call. I discussed the case by phone with Dr. Baxter Flattery as we reviewed the images. I observe focal cortical thickening in the  proximal femoral diaphysis laterally in a pattern consistent with a stress fracture, with surrounding periostitis and adjacent marrow edema. I do not see a sinus tract, and I suspect the concern for sinus tract initially raised may have been based on assessment of an adjacent vessel as being a sinus tract, but no sinus tract is retrospectively visible and Dr. Baxter Flattery tells me that there is no draining sinus tract to the skin on physical exam inspection. I do note that this somewhat atypical location for femoral stress fracture has been associated in the literature with the use of bisphosphonates. Correlate with patient medication history. Note is also made of a herniation of bowel through the inferolateral margin of the right lateral abdominal wall musculature, shown on image 31/6. There is degenerative arthropathy of both hips and degenerative disc disease in the lower lumbar spine. Electronically Signed   By: Van Clines M.D.   On: 10/06/2015 16:41   Result Date: 10/06/2015 CLINICAL DATA:  Right hip pain. EXAM: MRI OF THE RIGHT HIP WITHOUT AND WITH CONTRAST TECHNIQUE: Multiplanar, multisequence MR imaging was performed both before and after administration of intravenous contrast. CONTRAST:  38mL MULTIHANCE GADOBENATE DIMEGLUMINE 529 MG/ML IV SOLN COMPARISON:  Right hip radiographs 10/05/2015 FINDINGS: Bones: There is an area of abnormal increased T2 signal intensity demonstrated marrow and cortical portion of the proximal right femoral shaft bowel below the trochanteric region. This corresponds with a focal abnormality on recent plain radiographs. There is mild T2 signal abnormality also in the soft tissues surrounding this area. Appearance is most suggestive of the area of osteomyelitis with a focal sinus tract. Nondisplaced fracture or infiltrating neoplasm felt less likely. There is no expansile change. Except for the focal cortical abnormality, the bone cortex is otherwise intact. No endosteal  scalloping. Diffuse heterogeneous appearance to the marrow throughout the visualized pelvis and bilateral hips likely representing asymmetric fatty marrow replacement or anemia. Articular cartilage and labrum Articular cartilage: Limited visualization due to motion artifact. Appears grossly intact. Labrum:  Limited visualization.  Appears grossly intact. Joint or bursal effusion Joint effusion:  Small right hip effusion. Bursae:  No bursal collections. Muscles and tendons Muscles and tendons:  No acute abnormality. Other findings Miscellaneous:  None. IMPRESSION: Abnormal marrow signal intensity demonstrated in the proximal right femoral shaft with small focal cortical defect and surrounding soft tissue infiltration. Appearance most likely to represent osteomyelitis with sinus tract. Electronically Signed: By: Lucienne Capers M.D. On: 10/05/2015 21:38   [place] Extrem Low Right Ltd  Result Date: 10/08/2015 [facility] US performed of: Right This study was ordered, performed, and interpreted by Charlann Boxer D.O.  Hip: Trochanteric bursa with significant hypoechoic changes and swelling Acetabular labrum visualized and without tears, displacement, or effusion in joint. Femoral neck appears unremarkable without increased power doppler signal along Cortex.    Greater trochanter bursitis   Procedure: Real-time Ultrasound Guided Injection of right greater trochanteric bursitis secondary to patient's body habitus Device: GE Logiq E Ultrasound guided injection is preferred based studies that show increased duration, increased effect, greater accuracy, decreased procedural pain, increased response rate, and decreased cost with ultrasound guided versus blind injection. Verbal informed consent obtained. Time-out conducted. Noted no overlying erythema, induration, or other signs of local infection. Skin prepped in a sterile fashion. Local anesthesia: Topical Ethyl chloride. With sterile technique and under real time ultrasound  guidance:  Greater trochanteric area was visualized and patient's bursa was noted. A 22-gauge 3 inch needle was inserted and 4 cc of 0.5% Marcaine and 1 cc of Kenalog 40 mg/dL was injected. Pictures taken Completed without difficulty Pain immediately resolved suggesting accurate placement of the medication. Advised to call if fevers/chills, erythema, induration, drainage, or persistent bleeding. Images permanently stored and available for review in the ultrasound unit. Impression: Technically successful ultrasound guided injection.   Dg Hip Unilat W Or Wo Pelvis 2-3 Views Right  Result Date: 10/05/2015 CLINICAL DATA:  Sharp pain in the right hip. EXAM: DG HIP (WITH OR WITHOUT PELVIS) 2-3V RIGHT COMPARISON:  None. FINDINGS: There is no evidence of hip fracture or dislocation. There are advanced osteoarthritic changes of the right hip and moderate osteoarthritic changes of the left hip. Cortical irregularity with sclerotic margins of the lateral aspect of the proximal right femur may represent a healed fracture. Soft tissues are grossly normal. IMPRESSION: Advanced osteoarthritic changes of the right hip and moderate osteoarthritic changes of the left hip. Cortical irregularity of the proximal right femur may represent a subacute fracture. Electronically Signed   By: Fidela Salisbury M.D.   On: 10/05/2015 17:48   Dg Femur, Min 2 Views Right  Result Date: 10/08/2015 CLINICAL DATA:  Fracture EXAM: RIGHT FEMUR 2 VIEWS COMPARISON:  10/05/2015 FINDINGS: An intra medullary rod as well as stabilization screws in the femoral neck, intertrochanteric region, and distal femur are noted. Proximal femur lateral stress fracture is noted. No breakage or loosening of the hardware. Anatomic alignment. IMPRESSION: ORIF femur as described. Electronically Signed   By: Marybelle Killings M.D.   On: 10/08/2015 22:18        Subjective:   Patient is feeling much better, denies any chest pain or dyspnea. Her pain has improved,  patient has been out of bed with help of physical therapy.   Discharge Exam: Vitals:   10/09/15 2122 10/10/15 0603  BP: 108/64 116/71  Pulse: (!) 55 65  Resp: 16 16  Temp: 98.1 F (36.7 C) 98.7 F (37.1 C)   Vitals:   10/09/15 1300 10/09/15 1519 10/09/15 2122 10/10/15 0603  BP: 119/60 (!) 102/41 108/64 116/71  Pulse: (!) 51 (!) 55 (!) 55 65  Resp: 15  16 16   Temp: 98.2 F (36.8 C)  98.1 F (36.7 C) 98.7 F (37.1 C)  TempSrc: Oral  Oral Oral  SpO2: 96%  94% 95%  Weight:      Height:        General: Pt is alert, awake, not in acute distress Cardiovascular: RRR, S1/S2 +, no rubs, no gallops Respiratory: CTA bilaterally, no wheezing, no rhonchi Abdominal: Soft, NT, ND, bowel sounds + Extremities: no edema, no cyanosis    The results  of significant diagnostics from this hospitalization (including imaging, microbiology, ancillary and laboratory) are listed below for reference.     Microbiology: Recent Results (from the past 240 hour(s))  Culture, blood (routine x 2)     Status: None (Preliminary result)   Collection Time: 10/06/15 12:23 AM  Result Value Ref Range Status   Specimen Description BLOOD LEFT ARM  Final   Special Requests BOTTLES DRAWN AEROBIC AND ANAEROBIC 10CC  Final   Culture   Final    NO GROWTH 3 DAYS Performed at [facility]    Report Status PENDING  Incomplete  Culture, blood (routine x 2)     Status: None (Preliminary result)   Collection Time: 10/06/15 12:23 AM  Result Value Ref Range Status   Specimen Description BLOOD LEFT ARM  Final   Special Requests BOTTLES DRAWN AEROBIC AND ANAEROBIC 10CC  Final   Culture   Final    NO GROWTH 3 DAYS Performed at [facility]    Report Status PENDING  Incomplete  Surgical pcr screen     Status: None   Collection Time: 10/07/15 11:47 AM  Result Value Ref Range Status   MRSA, PCR NEGATIVE NEGATIVE Final   Staphylococcus aureus NEGATIVE NEGATIVE Final    Comment:        The Xpert SA  Assay (FDA approved for NASAL specimens in patients over 103 years of age), is one component of a comprehensive surveillance program.  Test performance has been validated by [facility] for patients greater than or equal to 41 year old. It is not intended to diagnose infection nor to guide or monitor treatment.      Labs: BNP (last 3 results)  Recent Labs  07/18/15 1506  BNP [id number]*   Basic Metabolic Panel:  Recent Labs Lab 10/06/15 0408 10/07/15 0833 10/08/15 0412 10/09/15 0416 10/10/15 0410  NA 142 139 141 139 135  K 3.8 3.4* 3.4* 4.2 4.4  CL 101 101 101 103 103  CO2 33* 32 33* 29 26  GLUCOSE 114* 87 86 134* 108*  BUN 32* 20 18 20  27*  CREATININE 0.88 0.82 0.80 0.80 0.83  CALCIUM 9.9 10.4* 10.0 9.1 9.4   Liver Function Tests:  Recent Labs Lab 10/06/15 0408  AST 18  ALT 18  ALKPHOS 49  BILITOT 0.6  PROT 6.5  ALBUMIN 4.3   No results for input(s): LIPASE, AMYLASE in the last 168 hours. No results for input(s): AMMONIA in the last 168 hours. CBC:  Recent Labs Lab 10/05/15 1823 10/06/15 0408 10/07/15 0833 10/08/15 0412 10/09/15 0416 10/10/15 0410  WBC 12.7* 12.2* 16.2* 12.4* 16.5* 13.0*  NEUTROABS 10.9*  --  14.1*  --  15.3* 10.3*  HGB 13.8 14.0 15.0 14.4 13.6 12.4  HCT 40.9 42.6 44.7 43.3 41.9 37.8  MCV 113.6* 115.1* 115.2* 113.6* 117.0* 116.3*  PLT 486* 524* 507* 470* 497* 441*   Cardiac Enzymes: No results for input(s): CKTOTAL, CKMB, CKMBINDEX, TROPONINI in the last 168 hours. BNP: Invalid input(s): POCBNP CBG: No results for input(s): GLUCAP in the last 168 hours. D-Dimer No results for input(s): DDIMER in the last 72 hours. Hgb A1c No results for input(s): HGBA1C in the last 72 hours. Lipid Profile No results for input(s): CHOL, HDL, LDLCALC, TRIG, CHOLHDL, LDLDIRECT in the last 72 hours. Thyroid function studies No results for input(s): TSH, T4TOTAL, T3FREE, THYROIDAB in the last 72 hours.  Invalid input(s): FREET3 Anemia work  up No results for input(s): VITAMINB12, FOLATE, FERRITIN,  TIBC, IRON, RETICCTPCT in the last 72 hours. Urinalysis    Component Value Date/Time   COLORURINE YELLOW 09/01/2014 2053   APPEARANCEUR CLEAR 09/01/2014 2053   LABSPEC 1.010 09/01/2014 2053   PHURINE 6.0 09/01/2014 2053   GLUCOSEU NEGATIVE 09/01/2014 2053   HGBUR NEGATIVE 09/01/2014 2053   BILIRUBINUR NEGATIVE 09/01/2014 2053   BILIRUBINUR n 11/19/2013 1502   [place] 09/01/2014 2053   PROTEINUR NEGATIVE 09/01/2014 2053   UROBILINOGEN 0.2 09/01/2014 2053   NITRITE NEGATIVE 09/01/2014 2053   LEUKOCYTESUR NEGATIVE 09/01/2014 2053   Sepsis Labs Invalid input(s): PROCALCITONIN,  WBC,  LACTICIDVEN Microbiology Recent Results (from the past 240 hour(s))  Culture, blood (routine x 2)     Status: None (Preliminary result)   Collection Time: 10/06/15 12:23 AM  Result Value Ref Range Status   Specimen Description BLOOD LEFT ARM  Final   Special Requests BOTTLES DRAWN AEROBIC AND ANAEROBIC 10CC  Final   Culture   Final    NO GROWTH 3 DAYS Performed at [facility]    Report Status PENDING  Incomplete  Culture, blood (routine x 2)     Status: None (Preliminary result)   Collection Time: 10/06/15 12:23 AM  Result Value Ref Range Status   Specimen Description BLOOD LEFT ARM  Final   Special Requests BOTTLES DRAWN AEROBIC AND ANAEROBIC 10CC  Final   Culture   Final    NO GROWTH 3 DAYS Performed at [facility]    Report Status PENDING  Incomplete  Surgical pcr screen     Status: None   Collection Time: 10/07/15 11:47 AM  Result Value Ref Range Status   MRSA, PCR NEGATIVE NEGATIVE Final   Staphylococcus aureus NEGATIVE NEGATIVE Final    Comment:        The Xpert SA Assay (FDA approved for NASAL specimens in patients over 36 years of age), is one component of a comprehensive surveillance program.  Test performance has been validated by [facility] for patients greater than or equal to 78 year  old. It is not intended to diagnose infection nor to guide or monitor treatment.      Time coordinating discharge: 45 minutes  SIGNED:   Tawni Millers, MD  [facility] 10/10/2015, 8:19 AM Pager   If 7PM-7AM, please contact night-coverage [email or web address] Password [email or web address]

## 2015-10-10 NOTE — Progress Notes (Signed)
Physical Therapy Treatment Patient Details Name: Sarah Mays MRN: [id number] DOB: Oct 11, 1930 Today's Date: November 09, 2015    History of Present Illness pt was admitted for impending pathological fx on R proximal femur. She is s/p IM nail.  H/O CAD, HF, HTN, A Fib    PT Comments    Marked improvement in activity tolerance with better pain control.  Follow Up Recommendations  SNF     Equipment Recommendations  None recommended by PT    Recommendations for Other Services OT consult     Precautions / Restrictions Precautions Precautions: Fall Restrictions Weight Bearing Restrictions: No RLE Weight Bearing: Weight bearing as tolerated    Mobility  Bed Mobility Overal bed mobility: Needs Assistance Bed Mobility: Sit to Supine     Supine to sit: Min assist     General bed mobility comments: cues for sequence and use of L LE to self assistq  Transfers Overall transfer level: Needs assistance Equipment used: Rolling walker (2 wheeled) Transfers: Sit to/from Stand Sit to Stand: Min assist         General transfer comment: cues for LE management and use of UEs to self assist  Ambulation/Gait Ambulation/Gait assistance: Min assist Ambulation Distance (Feet): 85 Feet (and 15' to bathroom) Assistive device: Rolling walker (2 wheeled) Gait Pattern/deviations: Step-to pattern;Decreased step length - right;Decreased step length - left;Shuffle;Trunk flexed Gait velocity: decr Gait velocity interpretation: Below normal speed for age/gender General Gait Details: cues for sequence, posture and position from [employer]            Wheelchair Mobility    Modified Rankin (Stroke Patients Only)       Balance                                    Cognition Arousal/Alertness: Awake/alert Behavior During Therapy: WFL for tasks assessed/performed Overall Cognitive Status: Within Functional Limits for tasks assessed                       Exercises      General Comments        Pertinent Vitals/Pain Pain Assessment: 0-10 Pain Score: 3  Pain Location: R hip/thigh Pain Descriptors / Indicators: Aching;Sore Pain Intervention(s): Limited activity within patient's tolerance;Monitored during session;Premedicated before session;Ice applied    Home Living                      Prior Function            PT Goals (current goals can now be found in the care plan section) Acute Rehab PT Goals Patient Stated Goal: return to independence PT Goal Formulation: With patient Time For Goal Achievement: 10/16/15 Potential to Achieve Goals: Good Progress towards PT goals: Progressing toward goals    Frequency  Min 5X/week    PT Plan Current plan remains appropriate    Co-evaluation             End of Session Equipment Utilized During Treatment: Gait belt Activity Tolerance: Patient tolerated treatment well Patient left: in chair;with call bell/phone within reach;with chair alarm set     Time: [id number] PT Time Calculation (min) (ACUTE ONLY): 27 min  Charges:  $Gait Training: 23-37 mins                    G Codes:      Asher Babilonia Nov 09, 2015, 1:53  PM   

## 2015-10-10 NOTE — Progress Notes (Signed)
Patient ID: Sarah Mays, female   DOB: 08/18/1930, 82 y.o.   MRN: [id number] Subjective: 2 Days Post-Op Procedure(s) (LRB): INTRAMEDULLARY (IM) NAIL FEMORAL RIGHT (Right)    Patient reports pain as mild to moderate, a little more today than yesterday.  Doesn't like taking pain meds due to side effects of constipation  Objective:   VITALS:   Vitals:   10/09/15 2122 10/10/15 0603  BP: 108/64 116/71  Pulse: (!) 55 65  Resp: 16 16  Temp: 98.1 F (36.7 C) 98.7 F (37.1 C)    Neurovascular intact Incision: dressing C/D/I  LABS  Recent Labs  10/08/15 0412 10/09/15 0416 10/10/15 0410  HGB 14.4 13.6 12.4  HCT 43.3 41.9 37.8  WBC 12.4* 16.5* 13.0*  PLT 470* 497* 441*     Recent Labs  10/08/15 0412 10/09/15 0416 10/10/15 0410  NA 141 139 135  K 3.4* 4.2 4.4  BUN 18 20 27*  CREATININE 0.80 0.80 0.83  GLUCOSE 86 134* 108*    No results for input(s): LABPT, INR in the last 72 hours.   Assessment/Plan: 2 Days Post-Op Procedure(s) (LRB): INTRAMEDULLARY (IM) NAIL FEMORAL RIGHT (Right)   Up with therapy Discharge to SNF most likely today RTC in 2 weeks WBAT RLE Reviewed medication use

## 2015-10-10 NOTE — Progress Notes (Signed)
PT given a suppository for constipation.  Only small hard stools noted. Pt given prune juice  to help relive constipation.  PT able to transfer to the [facility] with minimal assistance. Will cont to monitor Patient and follow up with option of enema if needed.

## 2015-10-10 NOTE — Progress Notes (Signed)
Pt is ready to return to [facility] today ( SNF ). Daughter will provide transportation. SNF would like pt there after 1: 30 pm. Pt / daughter / nsg aware. D/C Summary sent to SNF for review. Scripts included in d/c packet. # for report provided to nsg. D/C packet provided to pt.  Werner Lean LCSW [phone]

## 2015-10-11 LAB — CULTURE, BLOOD (ROUTINE X 2)
CULTURE: NO GROWTH
Culture: NO GROWTH

## 2015-10-14 DIAGNOSIS — M629 Disorder of muscle, unspecified: Secondary | ICD-10-CM | POA: Diagnosis not present

## 2015-10-14 DIAGNOSIS — M62561 Muscle wasting and atrophy, not elsewhere classified, right lower leg: Secondary | ICD-10-CM | POA: Diagnosis not present

## 2015-10-14 DIAGNOSIS — M79604 Pain in right leg: Secondary | ICD-10-CM | POA: Diagnosis not present

## 2015-10-14 DIAGNOSIS — R2689 Other abnormalities of gait and mobility: Secondary | ICD-10-CM | POA: Diagnosis not present

## 2015-10-14 DIAGNOSIS — R278 Other lack of coordination: Secondary | ICD-10-CM | POA: Diagnosis not present

## 2015-10-14 DIAGNOSIS — M25551 Pain in right hip: Secondary | ICD-10-CM | POA: Diagnosis not present

## 2015-10-15 ENCOUNTER — Encounter: Payer: Self-pay | Admitting: Internal Medicine

## 2015-10-15 ENCOUNTER — Non-Acute Institutional Stay (SKILLED_NURSING_FACILITY): Payer: Medicare Other | Admitting: Internal Medicine

## 2015-10-15 DIAGNOSIS — R001 Bradycardia, unspecified: Secondary | ICD-10-CM | POA: Diagnosis not present

## 2015-10-15 DIAGNOSIS — K5903 Drug induced constipation: Secondary | ICD-10-CM

## 2015-10-15 DIAGNOSIS — M810 Age-related osteoporosis without current pathological fracture: Secondary | ICD-10-CM

## 2015-10-15 DIAGNOSIS — Z9889 Other specified postprocedural states: Secondary | ICD-10-CM

## 2015-10-15 DIAGNOSIS — Z8781 Personal history of (healed) traumatic fracture: Secondary | ICD-10-CM

## 2015-10-15 DIAGNOSIS — M7061 Trochanteric bursitis, right hip: Secondary | ICD-10-CM

## 2015-10-15 DIAGNOSIS — I480 Paroxysmal atrial fibrillation: Secondary | ICD-10-CM | POA: Diagnosis not present

## 2015-10-15 DIAGNOSIS — M25551 Pain in right hip: Secondary | ICD-10-CM | POA: Diagnosis not present

## 2015-10-15 DIAGNOSIS — R2689 Other abnormalities of gait and mobility: Secondary | ICD-10-CM | POA: Diagnosis not present

## 2015-10-15 DIAGNOSIS — M79604 Pain in right leg: Secondary | ICD-10-CM | POA: Diagnosis not present

## 2015-10-15 DIAGNOSIS — S72301A Unspecified fracture of shaft of right femur, initial encounter for closed fracture: Secondary | ICD-10-CM | POA: Diagnosis not present

## 2015-10-15 DIAGNOSIS — M62561 Muscle wasting and atrophy, not elsewhere classified, right lower leg: Secondary | ICD-10-CM | POA: Diagnosis not present

## 2015-10-15 DIAGNOSIS — R278 Other lack of coordination: Secondary | ICD-10-CM | POA: Diagnosis not present

## 2015-10-15 DIAGNOSIS — M629 Disorder of muscle, unspecified: Secondary | ICD-10-CM | POA: Diagnosis not present

## 2015-10-15 NOTE — Progress Notes (Signed)
Patient ID: Sarah Mays, female   DOB: Oct 28, 1930, 82 y.o.   MRN: [id number]  Provider:  [facility] Location:   S[facility] Room Number: [id number] of Service:  SNF (31)  PCP: Sarah Kinnier, DO Patient Care Team: Sarah Curry, DO as PCP - General (Geriatric Medicine) [facility] Sarah Horn, MD as Consulting Physician (Ophthalmology) Sarah Dresser, MD as Consulting Physician (Cardiology) Sarah Lark, MD as Consulting Physician (Hematology and Oncology) Sarah Jacks, MD as Consulting Physician (Ophthalmology) Sarah Belfast, MD as Consulting Physician (Otolaryngology)  Extended Emergency Contact Information Primary Emergency Contact: Sarah Mays Address: [address]          [place] [address] [place] Phone: [phone] Relation: Spouse Secondary Emergency Contact: Sarah Mays [place] of [place] Phone: [phone] Mobile Phone: [phone] Relation: Daughter  Code Status: DNR Goals of Care: Advanced Directive information Advanced Directives 10/05/2015  Does patient have an advance directive? Yes  Type of Advance Directive Out of facility DNR (pink MOST or yellow form)  Does patient want to make changes to advanced directive? No - Patient declined  Copy of advanced directive(s) in chart? No - copy requested  Would patient like information on creating an advanced directive? -  Pre-existing out of facility DNR order (yellow form or pink MOST form) Yellow form placed in chart (order not valid for inpatient use)   Chief Complaint  Patient presents with  . New Admit To SNF    rehab admission    HPI: Patient is a 82 y.o. female well known to me from [facility] clinic seen today for admission to s/p hospitalization 8/27-10/10/15 with a stress fracture of her hip.  She lives in [place].  She presented on 8/27 with right hip pain.  She noted the pain getting suddenly worse when she kicked her right leg with her left  foot when taking off her shoes.  She'd already had about 2 weeks of dull constant pain that did not respond to the steroid injection she received with Sarah Mays.  The MRI she had in the ED showed abnormal signal in the proximal right femoral shaft with small focal cortical defect and surrounding soft tissue infiltration suggestive of osteomyelitis with sinus tract.  She was initially admitted with that as the diagnosis.  As her clinical picture was reviewed and ID and orthopedics got involved, it was determined that she actually had a proximal femoral diaphysis stress fracture.  She underwent intramedullary nailing of the right femur by Dr. Alvan Dame on 10/09/15.  Her leukocytosis was felt to be reactive in nature.    For her afib, her amiodarone was continued and she remains on xarelto for anticoagulation.  Her rate was well controlled.  Her bp remained stable on her regular meds.    She remains on the same dose of levothyroxine and is still on her hydroxyurea for polycythemia.  Her bowel regimen was not changed.  She was sent here with hydrocodone for her pain instead of tramadol and remains on her fentanyl patch for her chronic back pain along with gabapentin.    When seen, she noted discomfort in the hip area, but overall is doing well.  She's been up working with therapy and making progress.    Staff have had concerns about her bradycardia since she is not on meds that affect the pulse typically and a note was sent to her cardiology for input.    Past Medical History:  Diagnosis  Date  . Abnormal stress test    a. 11/2011 Ex MV: EF 80%, small, partially reversible anteroapical defect->mild ischemia vs attenuation-->med Rx.  [name] Kitchen Alopecia, unspecified   . Backache, unspecified   . Chronic diastolic CHF (congestive heart failure) ([facility])    a. 11/2013 Echo: EF 60-65%, no rwma, mild TR, PASP 3mmHg.  . Closed fracture of lower end of radius with ulna   . Deafness    Left, s/p multiple surgeries  .  Edema   . Essential hypertension   . Gouty arthropathy, unspecified   . Headache(784.0)   . Hypercalcemia   . Hyperpotassemia   . Hyposmolality and/or hyponatremia   . Hypothyroidism   . Insomnia, unspecified   . Macular degeneration    Left, s/p inj. tx  . Mitral valve disorders   . Neck mass    right, w/u including MRI negative  . Other malaise and fatigue   . PAF (paroxysmal atrial fibrillation) (HCC)    a. on amio (02/2012 mild obstruction on PFT's) and xarelto.  . Polycythemia vera(238.4)   . Pure hypercholesterolemia   . Senile osteoporosis    Reclast in the past  . Tricuspid valve disorders, specified as nonrheumatic    Past Surgical History:  Procedure Laterality Date  . ABLATION  08/28/14   Back   . BREAST BIOPSY  06/18/1998   left  . BREAST BIOPSY    . FEMUR IM NAIL Right 10/08/2015   Procedure: INTRAMEDULLARY (IM) NAIL FEMORAL RIGHT;  Surgeon: Sarah Cancel, MD;  Location: [facility] ORS;  Service: Orthopedics;  Laterality: Right;  . TONSILLECTOMY    . TOTAL ABDOMINAL HYSTERECTOMY    . TYMPANOPLASTY Bilateral     reports that she quit smoking about 31 years ago. Her smoking use included Cigarettes. She has never used smokeless tobacco. She reports that she drinks about 0.6 oz of alcohol per week . She reports that she does not use drugs. Social History   Social History  . Marital status: Married    Spouse name: Sarah Mays  . Number of children: 2  . Years of education: 16   Occupational History  . Not on file.   Social History Main Topics  . Smoking status: Former Smoker    Types: Cigarettes    Quit date: 01/19/1984  . Smokeless tobacco: Never Used  . Alcohol use 0.6 oz/week    1 Glasses of wine per week     Comment: 2 per day/ 14 a week  . Drug use: No  . Sexual activity: Not Currently   Other Topics Concern  . Not on file   Social History Narrative   Patient is Married 1955. 2 kids. 70 grandkid-84 years old in 2016. College graduate Sarah Mays. Of [place]).     Lives in apartment,  Independent Living  section at [facility] since 02/2013.      Stay at home mother.       No Smoking history, Mod. alcohol use.   Patient has a living will, POA      Hobbies: CPU and painting             Functional Status Survey:    Family History  Problem Relation Age of Onset  . Hypertension Father   . Heart disease Father     patient does not know details.   . Ovarian cancer Mother 65  . Cancer Sister     colon  . Heart disease Sister   . Cancer Sister   .  Cancer Sister     hodgkin disease  . Breast cancer Daughter   . Cancer Daughter     breast    Health Maintenance  Topic Date Due  . TETANUS/TDAP  08/03/1951  . INFLUENZA VACCINE  09/09/2015  . DEXA SCAN  Completed  . ZOSTAVAX  Completed  . PNA vac Low Risk Adult  Completed    Allergies  Allergen Reactions  . Amlodipine Swelling and Other (See Comments)    Reaction:  Lower extremity swelling       Medication List       Accurate as of 10/15/15  2:30 PM. Always use your most recent med list.          acetaminophen 325 MG tablet Commonly known as:  TYLENOL Take 2 tablets (650 mg total) by mouth every 8 (eight) hours as needed for mild pain (or Fever >/= 101).   aluminum hydroxide 320 MG/5ML suspension Commonly known as:  AMPHOJEL/ALTERNAGEL Take 15-30 mLs by mouth every 4 (four) hours as needed for indigestion.   amiodarone 200 MG tablet Commonly known as:  PACERONE Take 200 mg by mouth daily.   bisacodyl 10 MG suppository Commonly known as:  DULCOLAX Place 1 suppository (10 mg total) rectally daily as needed for moderate constipation.   cholecalciferol 1000 units tablet Commonly known as:  VITAMIN D Take 2,000 Units by mouth daily.   docusate sodium 100 MG capsule Commonly known as:  COLACE Take 1 capsule (100 mg total) by mouth 2 (two) times daily.   fentaNYL 25 MCG/HR patch Commonly known as:  DURAGESIC - dosed mcg/hr Place 1 patch (25 mcg  total) onto the skin every 3 (three) days.   furosemide 80 MG tablet Commonly known as:  LASIX Take 80 mg by mouth every morning.   furosemide 40 MG tablet Commonly known as:  LASIX Take 60 mg by mouth at bedtime.   gabapentin 100 MG capsule Commonly known as:  NEURONTIN Take 100 mg by mouth at bedtime.   guaiFENesin 600 MG 12 hr tablet Commonly known as:  MUCINEX Take 600 mg by mouth 2 (two) times daily as needed for cough or to loosen phlegm.   hydrALAZINE 50 MG tablet Commonly known as:  APRESOLINE Take 1 tablet (50 mg total) by mouth 3 (three) times daily. Please hold if blood pressure less than 140/90   HYDROcodone-acetaminophen 5-325 MG tablet Commonly known as:  NORCO/VICODIN Take 1-2 tablets by mouth every 6 (six) hours as needed for moderate pain.   hydroxyurea 500 MG capsule Commonly known as:  HYDREA TAKE 2 CAPSULES ON MONDAYS AND TAKE 1 CAPSULE ALL OTHER DAYS OF THE WEEK.   levothyroxine 112 MCG tablet Commonly known as:  SYNTHROID, LEVOTHROID Take 112 mcg by mouth daily before breakfast.   Magnesium 250 MG Tabs Take 250 mg by mouth daily.   Melatonin 5 MG Caps Take 5 mg by mouth at bedtime.   polycarbophil 625 MG tablet Commonly known as:  FIBERCON Take 2 tablets (1,250 mg total) by mouth 2 (two) times daily.   potassium chloride 10 MEQ tablet Commonly known as:  K-DUR,KLOR-CON Take 1 tablet (10 mEq total) by mouth 2 (two) times daily.   PRESERVISION AREDS 2 Caps Take 1 capsule by mouth 2 (two) times daily.   Rivaroxaban 15 MG Tabs tablet Commonly known as:  XARELTO Take 15 mg by mouth daily with supper.       Review of Systems  Constitutional: Positive for activity change. Negative for appetite  change, fatigue, fever and unexpected weight change.  HENT: Negative for congestion and sore throat.   Eyes: Negative for discharge.  Respiratory: Negative for chest tightness and shortness of breath.   Cardiovascular: Negative for chest pain,  palpitations and leg swelling.  Gastrointestinal: Positive for constipation. Negative for abdominal pain, blood in stool, diarrhea, nausea, rectal pain and vomiting.  Genitourinary: Negative for dysuria.  Musculoskeletal: Positive for arthralgias, back pain and gait problem. Negative for joint swelling, myalgias, neck pain and neck stiffness.  Skin: Negative for color change.  Neurological: Negative for dizziness, weakness and light-headedness.  Hematological: Negative for adenopathy. Bruises/bleeds easily.  Psychiatric/Behavioral: Negative for agitation, behavioral problems, confusion and sleep disturbance. The patient is not nervous/anxious.     Vitals:   10/15/15 1418  BP: 128/75  Pulse: (!) 41  Resp: 20  Temp: 98 F (36.7 C)  TempSrc: Oral  SpO2: 92%  Weight: 118 lb (53.5 kg)   Body mass index is 23.05 kg/m. Physical Exam  Constitutional: She is oriented to person, place, and time. She appears well-developed and well-nourished. No distress.  HENT:  Head: Normocephalic and atraumatic.  Right Ear: External ear normal.  Left Ear: External ear normal.  Nose: Nose normal.  Mouth/Throat: Oropharynx is clear and moist.  Eyes: EOM are normal. Pupils are equal, round, and reactive to light.  Neck: Normal range of motion. Neck supple. No JVD present. No thyromegaly present.  Cardiovascular: Intact distal pulses.   No murmur heard. irreg irreg, bradycardic  Pulmonary/Chest: Effort normal and breath sounds normal. No respiratory distress.  Abdominal: Soft. Bowel sounds are normal. She exhibits no distension. There is no tenderness. There is no rebound and no guarding.  Musculoskeletal: She exhibits tenderness.  Right lateral hip, no significant swelling, no erythema, drainage, mild warmth  Lymphadenopathy:    She has no cervical adenopathy.  Neurological: She is alert and oriented to person, place, and time.  Skin: Skin is warm and dry.  Dressings in place covering wounds, but no  visible sign of infection  Psychiatric: She has a normal mood and affect.    Labs reviewed: Basic Metabolic Panel:  Recent Labs  10/08/15 0412 10/09/15 0416 10/10/15 0410  NA 141 139 135  K 3.4* 4.2 4.4  CL 101 103 103  CO2 33* 29 26  GLUCOSE 86 134* 108*  BUN 18 20 27*  CREATININE 0.80 0.80 0.83  CALCIUM 10.0 9.1 9.4   Liver Function Tests:  Recent Labs  11/29/14 1518 06/20/15 0903 10/06/15 0408  AST 22 17 18   ALT 17 11 18   ALKPHOS 61 72 49  BILITOT 0.7 0.7 0.6  PROT 6.8 6.2 6.5  ALBUMIN 4.3 4.6 4.3   No results for input(s): LIPASE, AMYLASE in the last 8760 hours. No results for input(s): AMMONIA in the last 8760 hours. CBC:  Recent Labs  10/07/15 0833 10/08/15 0412 10/09/15 0416 10/10/15 0410  WBC 16.2* 12.4* 16.5* 13.0*  NEUTROABS 14.1*  --  15.3* 10.3*  HGB 15.0 14.4 13.6 12.4  HCT 44.7 43.3 41.9 37.8  MCV 115.2* 113.6* 117.0* 116.3*  PLT 507* 470* 497* 441*   Cardiac Enzymes: No results for input(s): CKTOTAL, CKMB, CKMBINDEX, TROPONINI in the last 8760 hours. BNP: Invalid input(s): POCBNP No results found for: HGBA1C Lab Results  Component Value Date   TSH 2.21 07/18/2015   No results found for: VITAMINB12 No results found for: FOLATE No results found for: IRON, TIBC, FERRITIN  Imaging and Procedures obtained prior to SNF admission:  Mr Hip Right W Wo Contrast  Addendum Date: 10/06/2015   ADDENDUM REPORT: 10/06/2015 16:41 ADDENDUM: The original report was by Dr. Viona Gilmore. Leonidas Romberg. The following addendum is by Dr. Van Clines: I was phoned by Dr. Baxter Flattery in Infectious Disease at about 4:15 p.m. on 10/06/2015 to review this case which was interpreted last night on-call. I discussed the case by phone with Dr. Baxter Flattery as we reviewed the images. I observe focal cortical thickening in the proximal femoral diaphysis laterally in a pattern consistent with a stress fracture, with surrounding periostitis and adjacent marrow edema. I do not see a sinus  tract, and I suspect the concern for sinus tract initially raised may have been based on assessment of an adjacent vessel as being a sinus tract, but no sinus tract is retrospectively visible and Dr. Baxter Flattery tells me that there is no draining sinus tract to the skin on physical exam inspection. I do note that this somewhat atypical location for femoral stress fracture has been associated in the literature with the use of bisphosphonates. Correlate with patient medication history. Note is also made of a herniation of bowel through the inferolateral margin of the right lateral abdominal wall musculature, shown on image 31/6. There is degenerative arthropathy of both hips and degenerative disc disease in the lower lumbar spine. Electronically Signed   By: Van Clines M.D.   On: 10/06/2015 16:41   Result Date: 10/06/2015 CLINICAL DATA:  Right hip pain. EXAM: MRI OF THE RIGHT HIP WITHOUT AND WITH CONTRAST TECHNIQUE: Multiplanar, multisequence MR imaging was performed both before and after administration of intravenous contrast. CONTRAST:  48mL MULTIHANCE GADOBENATE DIMEGLUMINE 529 MG/ML IV SOLN COMPARISON:  Right hip radiographs 10/05/2015 FINDINGS: Bones: There is an area of abnormal increased T2 signal intensity demonstrated marrow and cortical portion of the proximal right femoral shaft bowel below the trochanteric region. This corresponds with a focal abnormality on recent plain radiographs. There is mild T2 signal abnormality also in the soft tissues surrounding this area. Appearance is most suggestive of the area of osteomyelitis with a focal sinus tract. Nondisplaced fracture or infiltrating neoplasm felt less likely. There is no expansile change. Except for the focal cortical abnormality, the bone cortex is otherwise intact. No endosteal scalloping. Diffuse heterogeneous appearance to the marrow throughout the visualized pelvis and bilateral hips likely representing asymmetric fatty marrow replacement or  anemia. Articular cartilage and labrum Articular cartilage: Limited visualization due to motion artifact. Appears grossly intact. Labrum:  Limited visualization.  Appears grossly intact. Joint or bursal effusion Joint effusion:  Small right hip effusion. Bursae:  No bursal collections. Muscles and tendons Muscles and tendons:  No acute abnormality. Other findings Miscellaneous:  None. IMPRESSION: Abnormal marrow signal intensity demonstrated in the proximal right femoral shaft with small focal cortical defect and surrounding soft tissue infiltration. Appearance most likely to represent osteomyelitis with sinus tract. Electronically Signed: By: Lucienne Capers M.D. On: 10/05/2015 21:38   Dg Hip Unilat W Or Wo Pelvis 2-3 Views Right  Result Date: 10/05/2015 CLINICAL DATA:  Sharp pain in the right hip. EXAM: DG HIP (WITH OR WITHOUT PELVIS) 2-3V RIGHT COMPARISON:  None. FINDINGS: There is no evidence of hip fracture or dislocation. There are advanced osteoarthritic changes of the right hip and moderate osteoarthritic changes of the left hip. Cortical irregularity with sclerotic margins of the lateral aspect of the proximal right femur may represent a healed fracture. Soft tissues are grossly normal. IMPRESSION: Advanced osteoarthritic changes of the right hip and  moderate osteoarthritic changes of the left hip. Cortical irregularity of the proximal right femur may represent a subacute fracture. Electronically Signed   By: Fidela Salisbury M.D.   On: 10/05/2015 17:48    Assessment/Plan 1. Femoral shaft fracture, right, closed, initial encounter -stress fracture -is here for PT, OT with goal to return home to [place]  -is doing well -pain controlled with fentanyl and prn hydrocodone--would favor getting her back to tramadol before d/c home  2. Status post-operative repair of hip fracture -f/u with Dr. Alvan Dame as planned  3. Trochanteric bursitis of right hip -? Truly had some of this also or was she walking  around on her fracture for a while--I had noted her gait to be very abnormal at her last clinic appt and she got little benefit from the hip injection Sarah Mays gave her  4. Constipation due to pain medication -cont current regimen and monitor  5. Senile osteoporosis -was to get prolia again this fall -I'm concerned that this may have been a fragility fx which would suggest this should be stopped?   -I have not gotten a response from ortho about their opinion on this  6. Bradycardia with 41 - 50 beats per minute -slow afib -not on meds that should affect pulse at this time (amio and hydralazine only) -await cardiology input on whether she needs appt or monitor--does have sick sinus syndrome   7. Paroxysmal atrial fibrillation (HCC) -cont xarelto and amiodarone -await cardiology input re: bradycardic rate  Family/ staff Communication: discussed with rehab nurse  Labs/tests ordered:  F/u appts, should have f/u cbc before rehab d/c

## 2015-10-16 DIAGNOSIS — M629 Disorder of muscle, unspecified: Secondary | ICD-10-CM | POA: Diagnosis not present

## 2015-10-16 DIAGNOSIS — M62561 Muscle wasting and atrophy, not elsewhere classified, right lower leg: Secondary | ICD-10-CM | POA: Diagnosis not present

## 2015-10-16 DIAGNOSIS — M25551 Pain in right hip: Secondary | ICD-10-CM | POA: Diagnosis not present

## 2015-10-16 DIAGNOSIS — R278 Other lack of coordination: Secondary | ICD-10-CM | POA: Diagnosis not present

## 2015-10-16 DIAGNOSIS — M79604 Pain in right leg: Secondary | ICD-10-CM | POA: Diagnosis not present

## 2015-10-16 DIAGNOSIS — R609 Edema, unspecified: Secondary | ICD-10-CM | POA: Diagnosis not present

## 2015-10-16 DIAGNOSIS — R2689 Other abnormalities of gait and mobility: Secondary | ICD-10-CM | POA: Diagnosis not present

## 2015-10-17 ENCOUNTER — Telehealth: Payer: Self-pay

## 2015-10-17 DIAGNOSIS — M62561 Muscle wasting and atrophy, not elsewhere classified, right lower leg: Secondary | ICD-10-CM | POA: Diagnosis not present

## 2015-10-17 DIAGNOSIS — E039 Hypothyroidism, unspecified: Secondary | ICD-10-CM | POA: Diagnosis not present

## 2015-10-17 DIAGNOSIS — M25551 Pain in right hip: Secondary | ICD-10-CM | POA: Diagnosis not present

## 2015-10-17 DIAGNOSIS — R278 Other lack of coordination: Secondary | ICD-10-CM | POA: Diagnosis not present

## 2015-10-17 DIAGNOSIS — M629 Disorder of muscle, unspecified: Secondary | ICD-10-CM | POA: Diagnosis not present

## 2015-10-17 DIAGNOSIS — M79604 Pain in right leg: Secondary | ICD-10-CM | POA: Diagnosis not present

## 2015-10-17 DIAGNOSIS — R2689 Other abnormalities of gait and mobility: Secondary | ICD-10-CM | POA: Diagnosis not present

## 2015-10-17 NOTE — Telephone Encounter (Signed)
Cerise the nurse at [facility] called to follow-up on fax sent. Informed Cerise of Dr. Claris Gladden recommendations. Cerise requested a copy to be faxed to them. Cerise stated patient is not having any lightheadedness, but patient is anxious about her heart rate being low. Last BP 137/50 HR 47. Will forward to Dr. Aundra Dubin so he is aware.

## 2015-10-17 NOTE — Telephone Encounter (Signed)
If no lightheadedness, would not change amiodarone at this time given recent atrial fibrillation.

## 2015-10-17 NOTE — Telephone Encounter (Signed)
Patient is at [facility]. Received a fax today that was re-faxed to our office, previously faxed on 10/14/15. Per Fax:  Resident's pulse rate (apical) is 45-49 over last two days.  Hydralazine has been held for most doses due to BP lower than 140/90. She was admitted to our Rehab unit following surgery on right leg for stress fracture. Patient had IM Nailing on 10/08/15.   Please Advise- Candida Peeling RN Phone # [phone]  Patient saw Dr. Mariea Clonts on 10/15/15, her pulse rate still needs to be addressed. Will forward to Dr. Aundra Dubin.

## 2015-10-20 DIAGNOSIS — M629 Disorder of muscle, unspecified: Secondary | ICD-10-CM | POA: Diagnosis not present

## 2015-10-20 DIAGNOSIS — M25551 Pain in right hip: Secondary | ICD-10-CM | POA: Diagnosis not present

## 2015-10-20 DIAGNOSIS — M62561 Muscle wasting and atrophy, not elsewhere classified, right lower leg: Secondary | ICD-10-CM | POA: Diagnosis not present

## 2015-10-20 DIAGNOSIS — M79604 Pain in right leg: Secondary | ICD-10-CM | POA: Diagnosis not present

## 2015-10-20 DIAGNOSIS — R2689 Other abnormalities of gait and mobility: Secondary | ICD-10-CM | POA: Diagnosis not present

## 2015-10-20 DIAGNOSIS — R278 Other lack of coordination: Secondary | ICD-10-CM | POA: Diagnosis not present

## 2015-10-21 DIAGNOSIS — M25551 Pain in right hip: Secondary | ICD-10-CM | POA: Diagnosis not present

## 2015-10-21 DIAGNOSIS — M62561 Muscle wasting and atrophy, not elsewhere classified, right lower leg: Secondary | ICD-10-CM | POA: Diagnosis not present

## 2015-10-21 DIAGNOSIS — R2689 Other abnormalities of gait and mobility: Secondary | ICD-10-CM | POA: Diagnosis not present

## 2015-10-21 DIAGNOSIS — M79604 Pain in right leg: Secondary | ICD-10-CM | POA: Diagnosis not present

## 2015-10-21 DIAGNOSIS — M629 Disorder of muscle, unspecified: Secondary | ICD-10-CM | POA: Diagnosis not present

## 2015-10-21 DIAGNOSIS — R278 Other lack of coordination: Secondary | ICD-10-CM | POA: Diagnosis not present

## 2015-10-22 DIAGNOSIS — M629 Disorder of muscle, unspecified: Secondary | ICD-10-CM | POA: Diagnosis not present

## 2015-10-22 DIAGNOSIS — R278 Other lack of coordination: Secondary | ICD-10-CM | POA: Diagnosis not present

## 2015-10-22 DIAGNOSIS — M79604 Pain in right leg: Secondary | ICD-10-CM | POA: Diagnosis not present

## 2015-10-22 DIAGNOSIS — M62561 Muscle wasting and atrophy, not elsewhere classified, right lower leg: Secondary | ICD-10-CM | POA: Diagnosis not present

## 2015-10-22 DIAGNOSIS — M25551 Pain in right hip: Secondary | ICD-10-CM | POA: Diagnosis not present

## 2015-10-22 DIAGNOSIS — R2689 Other abnormalities of gait and mobility: Secondary | ICD-10-CM | POA: Diagnosis not present

## 2015-10-23 DIAGNOSIS — Z4789 Encounter for other orthopedic aftercare: Secondary | ICD-10-CM | POA: Diagnosis not present

## 2015-10-23 DIAGNOSIS — M629 Disorder of muscle, unspecified: Secondary | ICD-10-CM | POA: Diagnosis not present

## 2015-10-23 DIAGNOSIS — M79604 Pain in right leg: Secondary | ICD-10-CM | POA: Diagnosis not present

## 2015-10-23 DIAGNOSIS — M62561 Muscle wasting and atrophy, not elsewhere classified, right lower leg: Secondary | ICD-10-CM | POA: Diagnosis not present

## 2015-10-23 DIAGNOSIS — M25551 Pain in right hip: Secondary | ICD-10-CM | POA: Diagnosis not present

## 2015-10-23 DIAGNOSIS — R278 Other lack of coordination: Secondary | ICD-10-CM | POA: Diagnosis not present

## 2015-10-23 DIAGNOSIS — R2689 Other abnormalities of gait and mobility: Secondary | ICD-10-CM | POA: Diagnosis not present

## 2015-10-24 DIAGNOSIS — M25551 Pain in right hip: Secondary | ICD-10-CM | POA: Diagnosis not present

## 2015-10-24 DIAGNOSIS — R2689 Other abnormalities of gait and mobility: Secondary | ICD-10-CM | POA: Diagnosis not present

## 2015-10-24 DIAGNOSIS — M79604 Pain in right leg: Secondary | ICD-10-CM | POA: Diagnosis not present

## 2015-10-24 DIAGNOSIS — R278 Other lack of coordination: Secondary | ICD-10-CM | POA: Diagnosis not present

## 2015-10-24 DIAGNOSIS — M62561 Muscle wasting and atrophy, not elsewhere classified, right lower leg: Secondary | ICD-10-CM | POA: Diagnosis not present

## 2015-10-24 DIAGNOSIS — M629 Disorder of muscle, unspecified: Secondary | ICD-10-CM | POA: Diagnosis not present

## 2015-10-25 ENCOUNTER — Encounter: Payer: Self-pay | Admitting: Internal Medicine

## 2015-10-25 DIAGNOSIS — S72301A Unspecified fracture of shaft of right femur, initial encounter for closed fracture: Secondary | ICD-10-CM | POA: Insufficient documentation

## 2015-10-25 DIAGNOSIS — Z8781 Personal history of (healed) traumatic fracture: Secondary | ICD-10-CM | POA: Insufficient documentation

## 2015-10-25 DIAGNOSIS — Z9889 Other specified postprocedural states: Secondary | ICD-10-CM

## 2015-10-27 DIAGNOSIS — M79604 Pain in right leg: Secondary | ICD-10-CM | POA: Diagnosis not present

## 2015-10-27 DIAGNOSIS — R278 Other lack of coordination: Secondary | ICD-10-CM | POA: Diagnosis not present

## 2015-10-27 DIAGNOSIS — M629 Disorder of muscle, unspecified: Secondary | ICD-10-CM | POA: Diagnosis not present

## 2015-10-27 DIAGNOSIS — M62561 Muscle wasting and atrophy, not elsewhere classified, right lower leg: Secondary | ICD-10-CM | POA: Diagnosis not present

## 2015-10-27 DIAGNOSIS — M25551 Pain in right hip: Secondary | ICD-10-CM | POA: Diagnosis not present

## 2015-10-27 DIAGNOSIS — R2689 Other abnormalities of gait and mobility: Secondary | ICD-10-CM | POA: Diagnosis not present

## 2015-10-27 DIAGNOSIS — H353221 Exudative age-related macular degeneration, left eye, with active choroidal neovascularization: Secondary | ICD-10-CM | POA: Diagnosis not present

## 2015-10-28 ENCOUNTER — Telehealth: Payer: Self-pay | Admitting: Cardiology

## 2015-10-28 DIAGNOSIS — M62561 Muscle wasting and atrophy, not elsewhere classified, right lower leg: Secondary | ICD-10-CM | POA: Diagnosis not present

## 2015-10-28 DIAGNOSIS — R2689 Other abnormalities of gait and mobility: Secondary | ICD-10-CM | POA: Diagnosis not present

## 2015-10-28 DIAGNOSIS — M79604 Pain in right leg: Secondary | ICD-10-CM | POA: Diagnosis not present

## 2015-10-28 DIAGNOSIS — M629 Disorder of muscle, unspecified: Secondary | ICD-10-CM | POA: Diagnosis not present

## 2015-10-28 DIAGNOSIS — M25551 Pain in right hip: Secondary | ICD-10-CM | POA: Diagnosis not present

## 2015-10-28 DIAGNOSIS — R278 Other lack of coordination: Secondary | ICD-10-CM | POA: Diagnosis not present

## 2015-10-28 NOTE — Telephone Encounter (Signed)
[place] advised pt can stop hydralazine per Dr Aundra Dubin.

## 2015-10-28 NOTE — Telephone Encounter (Signed)
Stop hydralazine

## 2015-10-28 NOTE — Telephone Encounter (Signed)
New message    Pt C/O medication issue:  1. Name of Medication: hydralazine   2. How are you currently taking this medication (dosage and times per day)? 50 mg three time a day    3. Are you having a reaction (difficulty breathing--STAT)? No   4. What is your medication issue?can medication be discontinue due to blood pressure not running high

## 2015-10-28 NOTE — Telephone Encounter (Signed)
[place] states they have order to hold hydralazine if is less than  140/90. [place] states they check pt's BP tid and since 10/13/15 it has only been higher than 140/90 one time when it was 141/69. [place] asking if hydralazine can be discontinued. [place] advised I will forward to Dr Aundra Dubin for review.

## 2015-10-29 DIAGNOSIS — R2689 Other abnormalities of gait and mobility: Secondary | ICD-10-CM | POA: Diagnosis not present

## 2015-10-29 DIAGNOSIS — M25551 Pain in right hip: Secondary | ICD-10-CM | POA: Diagnosis not present

## 2015-10-29 DIAGNOSIS — M79604 Pain in right leg: Secondary | ICD-10-CM | POA: Diagnosis not present

## 2015-10-29 DIAGNOSIS — M62561 Muscle wasting and atrophy, not elsewhere classified, right lower leg: Secondary | ICD-10-CM | POA: Diagnosis not present

## 2015-10-29 DIAGNOSIS — R278 Other lack of coordination: Secondary | ICD-10-CM | POA: Diagnosis not present

## 2015-10-29 DIAGNOSIS — M629 Disorder of muscle, unspecified: Secondary | ICD-10-CM | POA: Diagnosis not present

## 2015-10-30 ENCOUNTER — Other Ambulatory Visit: Payer: Self-pay

## 2015-10-30 DIAGNOSIS — M629 Disorder of muscle, unspecified: Secondary | ICD-10-CM | POA: Diagnosis not present

## 2015-10-30 DIAGNOSIS — R2689 Other abnormalities of gait and mobility: Secondary | ICD-10-CM | POA: Diagnosis not present

## 2015-10-30 DIAGNOSIS — M79604 Pain in right leg: Secondary | ICD-10-CM | POA: Diagnosis not present

## 2015-10-30 DIAGNOSIS — M62561 Muscle wasting and atrophy, not elsewhere classified, right lower leg: Secondary | ICD-10-CM | POA: Diagnosis not present

## 2015-10-30 DIAGNOSIS — R278 Other lack of coordination: Secondary | ICD-10-CM | POA: Diagnosis not present

## 2015-10-30 DIAGNOSIS — M25551 Pain in right hip: Secondary | ICD-10-CM | POA: Diagnosis not present

## 2015-10-30 MED ORDER — HYDROCODONE-ACETAMINOPHEN 5-325 MG PO TABS: ORAL_TABLET | ORAL | 0 refills | Status: DC

## 2015-10-30 NOTE — Telephone Encounter (Signed)
Faxed to [facility] Fax Number: [phone], Phone Number [phone]  

## 2015-10-31 DIAGNOSIS — R2689 Other abnormalities of gait and mobility: Secondary | ICD-10-CM | POA: Diagnosis not present

## 2015-10-31 DIAGNOSIS — M629 Disorder of muscle, unspecified: Secondary | ICD-10-CM | POA: Diagnosis not present

## 2015-10-31 DIAGNOSIS — M79604 Pain in right leg: Secondary | ICD-10-CM | POA: Diagnosis not present

## 2015-10-31 DIAGNOSIS — R278 Other lack of coordination: Secondary | ICD-10-CM | POA: Diagnosis not present

## 2015-10-31 DIAGNOSIS — M25551 Pain in right hip: Secondary | ICD-10-CM | POA: Diagnosis not present

## 2015-10-31 DIAGNOSIS — M62561 Muscle wasting and atrophy, not elsewhere classified, right lower leg: Secondary | ICD-10-CM | POA: Diagnosis not present

## 2015-11-01 DIAGNOSIS — R278 Other lack of coordination: Secondary | ICD-10-CM | POA: Diagnosis not present

## 2015-11-01 DIAGNOSIS — M25551 Pain in right hip: Secondary | ICD-10-CM | POA: Diagnosis not present

## 2015-11-01 DIAGNOSIS — M79604 Pain in right leg: Secondary | ICD-10-CM | POA: Diagnosis not present

## 2015-11-01 DIAGNOSIS — M629 Disorder of muscle, unspecified: Secondary | ICD-10-CM | POA: Diagnosis not present

## 2015-11-01 DIAGNOSIS — R2689 Other abnormalities of gait and mobility: Secondary | ICD-10-CM | POA: Diagnosis not present

## 2015-11-01 DIAGNOSIS — M62561 Muscle wasting and atrophy, not elsewhere classified, right lower leg: Secondary | ICD-10-CM | POA: Diagnosis not present

## 2015-11-03 ENCOUNTER — Other Ambulatory Visit (HOSPITAL_BASED_OUTPATIENT_CLINIC_OR_DEPARTMENT_OTHER): Payer: Medicare Other

## 2015-11-03 ENCOUNTER — Telehealth: Payer: Self-pay | Admitting: Hematology and Oncology

## 2015-11-03 ENCOUNTER — Encounter: Payer: Self-pay | Admitting: Hematology and Oncology

## 2015-11-03 ENCOUNTER — Ambulatory Visit (HOSPITAL_BASED_OUTPATIENT_CLINIC_OR_DEPARTMENT_OTHER): Payer: Medicare Other | Admitting: Hematology and Oncology

## 2015-11-03 DIAGNOSIS — M62561 Muscle wasting and atrophy, not elsewhere classified, right lower leg: Secondary | ICD-10-CM | POA: Diagnosis not present

## 2015-11-03 DIAGNOSIS — D45 Polycythemia vera: Secondary | ICD-10-CM

## 2015-11-03 DIAGNOSIS — M25551 Pain in right hip: Secondary | ICD-10-CM | POA: Diagnosis not present

## 2015-11-03 DIAGNOSIS — M629 Disorder of muscle, unspecified: Secondary | ICD-10-CM | POA: Diagnosis not present

## 2015-11-03 DIAGNOSIS — M810 Age-related osteoporosis without current pathological fracture: Secondary | ICD-10-CM | POA: Diagnosis not present

## 2015-11-03 DIAGNOSIS — M79604 Pain in right leg: Secondary | ICD-10-CM | POA: Diagnosis not present

## 2015-11-03 DIAGNOSIS — R2689 Other abnormalities of gait and mobility: Secondary | ICD-10-CM | POA: Diagnosis not present

## 2015-11-03 DIAGNOSIS — I480 Paroxysmal atrial fibrillation: Secondary | ICD-10-CM | POA: Diagnosis not present

## 2015-11-03 DIAGNOSIS — R278 Other lack of coordination: Secondary | ICD-10-CM | POA: Diagnosis not present

## 2015-11-03 LAB — CBC WITH DIFFERENTIAL/PLATELET
BASO%: 1.4 % (ref 0.0–2.0)
BASOS ABS: 0.1 10*3/uL (ref 0.0–0.1)
EOS%: 1.1 % (ref 0.0–7.0)
Eosinophils Absolute: 0.1 10*3/uL (ref 0.0–0.5)
HCT: 43.5 % (ref 34.8–46.6)
HEMOGLOBIN: 13.9 g/dL (ref 11.6–15.9)
LYMPH%: 13.7 % — ABNORMAL LOW (ref 14.0–49.7)
MCH: 36.7 pg — AB (ref 25.1–34.0)
MCHC: 32 g/dL (ref 31.5–36.0)
MCV: 114.6 fL — ABNORMAL HIGH (ref 79.5–101.0)
MONO#: 0.9 10*3/uL (ref 0.1–0.9)
MONO%: 8.7 % (ref 0.0–14.0)
NEUT%: 75.1 % (ref 38.4–76.8)
NEUTROS ABS: 7.3 10*3/uL — AB (ref 1.5–6.5)
Platelets: 620 10*3/uL — ABNORMAL HIGH (ref 145–400)
RBC: 3.8 10*6/uL (ref 3.70–5.45)
RDW: 16 % — AB (ref 11.2–14.5)
WBC: 9.7 10*3/uL (ref 3.9–10.3)
lymph#: 1.3 10*3/uL (ref 0.9–3.3)

## 2015-11-03 MED ORDER — HYDROXYUREA 500 MG PO CAPS: ORAL_CAPSULE | ORAL | 9 refills | Status: DC

## 2015-11-03 NOTE — Telephone Encounter (Signed)
Gave patient avs report and appointments for October  °

## 2015-11-03 NOTE — Assessment & Plan Note (Signed)
She had osteoporosis and recent hip fracture. She remain on high-dose vitamin D supplements. She is doing well with rehabilitation

## 2015-11-03 NOTE — Assessment & Plan Note (Signed)
The patient is currently on amiodarone. She follows closely with cardiologist. She is noted to have significant bradycardia but the patient remain asymptomatic. I would defer to her cardiologist for medication adjustment She is on chronic anticoagulation therapy to reduce risk of stroke

## 2015-11-03 NOTE — Assessment & Plan Note (Addendum)
She tolerated treatment well. However, I am concerned about recent significant thrombocytosis This is likely precipitated by recent stress but she should receive doses modification of hydroxyurea. I recommend increasing the dose of hydroxyurea to 1000 mg on Saturdays and Sundays but to keep daily dosing from Monday to Friday she will take 500 mg She will continue Xarelto to prevent risk of thrombosis I will see her back a month from now with repeat blood work.

## 2015-11-03 NOTE — Progress Notes (Signed)
[place] OFFICE PROGRESS NOTE  Patient Care Team: Gayland Curry, DO as PCP - General (Geriatric Medicine) [facility] Hurman Horn, MD as Consulting Physician (Ophthalmology) Larey Dresser, MD as Consulting Physician (Cardiology) Heath Lark, MD as Consulting Physician (Hematology and Oncology) Clent Jacks, MD as Consulting Physician (Ophthalmology) Jerrell Belfast, MD as Consulting Physician (Otolaryngology)  SUMMARY OF ONCOLOGIC HISTORY:   Polycythemia vera ([place])   08/05/2011 Pathology Results    Peripheral blood JAK2 mutation was positive with low serum erythropoietin level. Bone marrow aspirate and biopsy was not performed.      08/12/2011 -  Chemotherapy    She is started on hydroxyurea with periodic phlebotomy.       INTERVAL HISTORY: Please see below for problem oriented charting. She returns for follow-up. She had recent hip fracture.. She is recovering well. She stated she had been compliant taking hydroxyurea. Denies recent infection. The patient denies any recent signs or symptoms of bleeding such as spontaneous epistaxis, hematuria or hematochezia.   REVIEW OF SYSTEMS:   Constitutional: Denies fevers, chills or abnormal weight loss Eyes: Denies blurriness of vision Ears, nose, mouth, throat, and face: Denies mucositis or sore throat Respiratory: Denies cough, dyspnea or wheezes Cardiovascular: Denies palpitation, chest discomfort or lower extremity swelling Gastrointestinal:  Denies nausea, heartburn or change in bowel habits Skin: Denies abnormal skin rashes Lymphatics: Denies new lymphadenopathy or easy bruising Neurological:Denies numbness, tingling or new weaknesses Behavioral/Psych: Mood is stable, no new changes  All other systems were reviewed with the patient and are negative.  I have reviewed the past medical history, past surgical history, social history and family history with the patient and they are  unchanged from previous note.  ALLERGIES:  is allergic to amlodipine.  MEDICATIONS:  Current Outpatient Prescriptions  Medication Sig Dispense Refill  . acetaminophen (TYLENOL) 325 MG tablet Take 2 tablets (650 mg total) by mouth every 8 (eight) hours as needed for mild pain (or Fever >/= 101). 30 tablet 0  . aluminum hydroxide (AMPHOJEL/ALTERNAGEL) 320 MG/5ML suspension Take 15-30 mLs by mouth every 4 (four) hours as needed for indigestion. 150 mL 0  . amiodarone (PACERONE) 200 MG tablet Take 200 mg by mouth daily.    . bisacodyl (DULCOLAX) 10 MG suppository Place 1 suppository (10 mg total) rectally daily as needed for moderate constipation. 12 suppository 0  . cholecalciferol (VITAMIN D) 1000 UNITS tablet Take 2,000 Units by mouth daily.     [name] Kitchen docusate sodium (COLACE) 100 MG capsule Take 1 capsule (100 mg total) by mouth 2 (two) times daily. 10 capsule 0  . fentaNYL (DURAGESIC - DOSED MCG/HR) 25 MCG/HR patch Place 1 patch (25 mcg total) onto the skin every 3 (three) days. 3 patch 0  . furosemide (LASIX) 40 MG tablet Take 60 mg by mouth at bedtime.    . furosemide (LASIX) 80 MG tablet Take 80 mg by mouth every morning.    . gabapentin (NEURONTIN) 100 MG capsule Take 100 mg by mouth at bedtime.     [name] Kitchen guaiFENesin (MUCINEX) 600 MG 12 hr tablet Take 600 mg by mouth 2 (two) times daily as needed for cough or to loosen phlegm.    [name] Kitchen HYDROcodone-acetaminophen (NORCO/VICODIN) 5-325 MG tablet Take 1 tablet by mouth every 6 hours as needed for moderate pain scale 1-5, take 2 tablets by mouth every 6 hours as needed for moderate pain scale 6-10 240 tablet 0  . hydroxyurea (HYDREA) 500 MG capsule May take  with food to minimize GI side effects. Take 500 mg daily from Mondays to Fridays and take 1000 mg on Saturdays and Sundays 40 capsule 9  . levothyroxine (SYNTHROID, LEVOTHROID) 112 MCG tablet Take 112 mcg by mouth daily before breakfast.    . Magnesium 250 MG TABS Take 250 mg by mouth daily.     .  Melatonin 5 MG CAPS Take 5 mg by mouth at bedtime.     . Multiple Vitamins-Minerals (PRESERVISION AREDS 2) CAPS Take 1 capsule by mouth 2 (two) times daily.     . polycarbophil (FIBERCON) 625 MG tablet Take 2 tablets (1,250 mg total) by mouth 2 (two) times daily. 60 tablet 0  . potassium chloride (K-DUR,KLOR-CON) 10 MEQ tablet Take 1 tablet (10 mEq total) by mouth 2 (two) times daily. 60 tablet 0  . Rivaroxaban (XARELTO) 15 MG TABS tablet Take 15 mg by mouth daily with supper.     No current facility-administered medications for this visit.     PHYSICAL EXAMINATION: ECOG PERFORMANCE STATUS: 1 - Symptomatic but completely ambulatory  Vitals:   11/03/15 1506  BP: (!) 124/59  Pulse: (!) 37  Resp: 18  Temp: 97.9 F (36.6 C)   Filed Weights   11/03/15 1506  Weight: 117 lb 8 oz (53.3 kg)    GENERAL:alert, no distress and comfortable SKIN: skin color, texture, turgor are normal, no rashes or significant lesions EYES: normal, Conjunctiva are pink and non-injected, sclera clear Musculoskeletal:no cyanosis of digits and no clubbing  NEURO: alert & oriented x 3 with fluent speech, no focal motor/sensory deficits  LABORATORY DATA:  I have reviewed the data as listed    Component Value Date/Time   NA 135 10/10/2015 0410   NA 139 06/20/2015 0903   K 4.4 10/10/2015 0410   CL 103 10/10/2015 0410   CO2 26 10/10/2015 0410   GLUCOSE 108 (H) 10/10/2015 0410   BUN 27 (H) 10/10/2015 0410   BUN 24 06/20/2015 0903   CREATININE 0.83 10/10/2015 0410   CREATININE 1.24 (H) 09/01/2015 1352   CALCIUM 9.4 10/10/2015 0410   PROT 6.5 10/06/2015 0408   PROT 6.2 06/20/2015 0903   ALBUMIN 4.3 10/06/2015 0408   ALBUMIN 4.6 06/20/2015 0903   AST 18 10/06/2015 0408   ALT 18 10/06/2015 0408   ALKPHOS 49 10/06/2015 0408   BILITOT 0.6 10/06/2015 0408   BILITOT 0.7 06/20/2015 0903   GFRNONAA >60 10/10/2015 0410   GFRAA >60 10/10/2015 0410    No results found for: SPEP, UPEP  Lab Results  Component  Value Date   WBC 9.7 11/03/2015   NEUTROABS 7.3 (H) 11/03/2015   HGB 13.9 11/03/2015   HCT 43.5 11/03/2015   MCV 114.6 (H) 11/03/2015   PLT 620 (H) 11/03/2015      Chemistry      Component Value Date/Time   NA 135 10/10/2015 0410   NA 139 06/20/2015 0903   K 4.4 10/10/2015 0410   CL 103 10/10/2015 0410   CO2 26 10/10/2015 0410   BUN 27 (H) 10/10/2015 0410   BUN 24 06/20/2015 0903   CREATININE 0.83 10/10/2015 0410   CREATININE 1.24 (H) 09/01/2015 1352   GLU 80 10/29/2014      Component Value Date/Time   CALCIUM 9.4 10/10/2015 0410   ALKPHOS 49 10/06/2015 0408   AST 18 10/06/2015 0408   ALT 18 10/06/2015 0408   BILITOT 0.6 10/06/2015 0408   BILITOT 0.7 06/20/2015 0903      ASSESSMENT &  PLAN:  Polycythemia vera She tolerated treatment well. However, I am concerned about recent significant thrombocytosis This is likely precipitated by recent stress but she should receive doses modification of hydroxyurea. I recommend increasing the dose of hydroxyurea to 1000 mg on Saturdays and Sundays but to keep daily dosing from Monday to Friday she will take 500 mg She will continue Xarelto to prevent risk of thrombosis I will see her back a month from now with repeat blood work.  Paroxysmal atrial fibrillation The patient is currently on amiodarone. She follows closely with cardiologist. She is noted to have significant bradycardia but the patient remain asymptomatic. I would defer to her cardiologist for medication adjustment She is on chronic anticoagulation therapy to reduce risk of stroke  Osteoporosis She had osteoporosis and recent hip fracture. She remain on high-dose vitamin D supplements. She is doing well with rehabilitation   Orders Placed This Encounter  Procedures  . CBC with Differential/Platelet    Standing Status:   Standing    Number of Occurrences:   9    Standing Expiration Date:   11/02/2016   All questions were answered. The patient knows to call the  clinic with any problems, questions or concerns. No barriers to learning was detected. I spent 15 minutes counseling the patient face to face. The total time spent in the appointment was 20 minutes and more than 50% was on counseling and review of test results     Heath Lark, MD 11/03/2015 3:31 PM

## 2015-11-04 DIAGNOSIS — M25551 Pain in right hip: Secondary | ICD-10-CM | POA: Diagnosis not present

## 2015-11-04 DIAGNOSIS — M62561 Muscle wasting and atrophy, not elsewhere classified, right lower leg: Secondary | ICD-10-CM | POA: Diagnosis not present

## 2015-11-04 DIAGNOSIS — M629 Disorder of muscle, unspecified: Secondary | ICD-10-CM | POA: Diagnosis not present

## 2015-11-04 DIAGNOSIS — R2689 Other abnormalities of gait and mobility: Secondary | ICD-10-CM | POA: Diagnosis not present

## 2015-11-04 DIAGNOSIS — R278 Other lack of coordination: Secondary | ICD-10-CM | POA: Diagnosis not present

## 2015-11-04 DIAGNOSIS — M79604 Pain in right leg: Secondary | ICD-10-CM | POA: Diagnosis not present

## 2015-11-05 ENCOUNTER — Non-Acute Institutional Stay: Payer: Medicare Other | Admitting: Internal Medicine

## 2015-11-05 ENCOUNTER — Encounter: Payer: Self-pay | Admitting: Internal Medicine

## 2015-11-05 VITALS — BP 140/68 | HR 47 | Temp 98.4°F | Wt 116.0 lb

## 2015-11-05 DIAGNOSIS — S72301A Unspecified fracture of shaft of right femur, initial encounter for closed fracture: Secondary | ICD-10-CM | POA: Diagnosis not present

## 2015-11-05 DIAGNOSIS — M5440 Lumbago with sciatica, unspecified side: Secondary | ICD-10-CM

## 2015-11-05 DIAGNOSIS — I2510 Atherosclerotic heart disease of native coronary artery without angina pectoris: Secondary | ICD-10-CM | POA: Diagnosis not present

## 2015-11-05 DIAGNOSIS — M629 Disorder of muscle, unspecified: Secondary | ICD-10-CM | POA: Diagnosis not present

## 2015-11-05 DIAGNOSIS — Z9889 Other specified postprocedural states: Secondary | ICD-10-CM

## 2015-11-05 DIAGNOSIS — M62561 Muscle wasting and atrophy, not elsewhere classified, right lower leg: Secondary | ICD-10-CM | POA: Diagnosis not present

## 2015-11-05 DIAGNOSIS — R001 Bradycardia, unspecified: Secondary | ICD-10-CM

## 2015-11-05 DIAGNOSIS — M810 Age-related osteoporosis without current pathological fracture: Secondary | ICD-10-CM

## 2015-11-05 DIAGNOSIS — Z8781 Personal history of (healed) traumatic fracture: Secondary | ICD-10-CM

## 2015-11-05 DIAGNOSIS — R2689 Other abnormalities of gait and mobility: Secondary | ICD-10-CM | POA: Diagnosis not present

## 2015-11-05 DIAGNOSIS — M25551 Pain in right hip: Secondary | ICD-10-CM | POA: Diagnosis not present

## 2015-11-05 DIAGNOSIS — M79604 Pain in right leg: Secondary | ICD-10-CM | POA: Diagnosis not present

## 2015-11-05 DIAGNOSIS — R278 Other lack of coordination: Secondary | ICD-10-CM | POA: Diagnosis not present

## 2015-11-05 NOTE — Patient Instructions (Addendum)
Follow up with Dr. Aundra Dubin about your low pulse  Work on tapering down on the hydrocodone as you can tolerate.  We will readdress in 6 weeks.

## 2015-11-05 NOTE — Progress Notes (Signed)
Location:  Occupational psychologist of Service:  Clinic (12)  Provider: Shenica Holzheimer L. Mariea Clonts, D.O., C.M.D.  Code Status: DNR Goals of Care:  Advanced Directives 11/03/2015  Does patient have an advance directive? Yes  Type of Paramedic of [place];Living will  Does patient want to make changes to advanced directive? No - Patient declined  Copy of advanced directive(s) in chart? No - copy requested  Would patient like information on creating an advanced directive? -  Pre-existing out of facility DNR order (yellow form or pink MOST form) -     Chief Complaint  Patient presents with  . Medical Management of Chronic Issues    follow up after rehab stay for hip fracture    HPI: Patient is a 82 y.o. female seen today for medical management of chronic diseases and f/u after rehab stay for hip fracture.  Getting around with her walker, but still hurting.  She plans to return to her apt tomorrow.  She thinks she is ready for that.      Dr. Alvy Bimler increased her hydrea yesterday due to platelet count going up recently.    Hates to take her pain medication.  She is using the fentanyl 73mcg.  Last took hydrocodone at 6am this morning.  It's now 4:25pm.  Takes it about 3 times per day.  wakes up with pain in the middle of the night, but usually is comfortable when sitting or lying down.  It's a combination of her back pain and her hip pain.  It moves up and down the right hip.  Lower back on left other times.  Hip is the more painful area.  Worst at the groin.  Uses two tablets b/c one doesn't give her six hours of relief.  They make her sleepy.    Has been constipated also.  Has had to have suppositories.  Finally went last night on her own.  Is taking fibercon and colace.  Says miralax has never helped her.  Sometimes has gotten MOM too.  Still remains bradycardic as low as 37, today 47.  Was going on in rehab also.    Past Medical History:  Diagnosis  Date  . Abnormal stress test    a. 11/2011 Ex MV: EF 80%, small, partially reversible anteroapical defect->mild ischemia vs attenuation-->med Rx.  [name] Kitchen Alopecia, unspecified   . Backache, unspecified   . Chronic diastolic CHF (congestive heart failure) ([place])    a. 11/2013 Echo: EF 60-65%, no rwma, mild TR, PASP 84mmHg.  . Closed fracture of lower end of radius with ulna   . Deafness    Left, s/p multiple surgeries  . Edema   . Essential hypertension   . Gouty arthropathy, unspecified   . Headache(784.0)   . Hypercalcemia   . Hyperpotassemia   . Hyposmolality and/or hyponatremia   . Hypothyroidism   . Insomnia, unspecified   . Macular degeneration    Left, s/p inj. tx  . Mitral valve disorders   . Neck mass    right, w/u including MRI negative  . Other malaise and fatigue   . PAF (paroxysmal atrial fibrillation) ([facility])    a. on amio (02/2012 mild obstruction on PFT's) and xarelto.  . Polycythemia vera(238.4)   . Pure hypercholesterolemia   . Senile osteoporosis    Reclast in the past  . Tricuspid valve disorders, specified as nonrheumatic     Past Surgical History:  Procedure Laterality Date  . ABLATION  08/28/14  Back   . BREAST BIOPSY  06/18/1998   left  . BREAST BIOPSY    . FEMUR IM NAIL Right 10/08/2015   Procedure: INTRAMEDULLARY (IM) NAIL FEMORAL RIGHT;  Surgeon: Paralee Cancel, MD;  Location: [facility];  Service: Orthopedics;  Laterality: Right;  . TONSILLECTOMY    . TOTAL ABDOMINAL HYSTERECTOMY    . TYMPANOPLASTY Bilateral     Allergies  Allergen Reactions  . Amlodipine Swelling and Other (See Comments)    Reaction:  Lower extremity swelling       Medication List       Accurate as of 11/05/15  4:30 PM. Always use your most recent med list.          acetaminophen 325 MG tablet Commonly known as:  TYLENOL Take 2 tablets (650 mg total) by mouth every 8 (eight) hours as needed for mild pain (or Fever >/= 101).   aluminum hydroxide 320 MG/5ML  suspension Commonly known as:  AMPHOJEL/ALTERNAGEL Take 15-30 mLs by mouth every 4 (four) hours as needed for indigestion.   amiodarone 200 MG tablet Commonly known as:  PACERONE Take 200 mg by mouth daily.   bisacodyl 10 MG suppository Commonly known as:  DULCOLAX Place 1 suppository (10 mg total) rectally daily as needed for moderate constipation.   cholecalciferol 1000 units tablet Commonly known as:  VITAMIN D Take 2,000 Units by mouth daily.   docusate sodium 100 MG capsule Commonly known as:  COLACE Take 1 capsule (100 mg total) by mouth 2 (two) times daily.   fentaNYL 25 MCG/HR patch Commonly known as:  DURAGESIC - dosed mcg/hr Place 1 patch (25 mcg total) onto the skin every 3 (three) days.   furosemide 80 MG tablet Commonly known as:  LASIX Take 80 mg by mouth every morning.   furosemide 40 MG tablet Commonly known as:  LASIX Take 60 mg by mouth at bedtime.   gabapentin 100 MG capsule Commonly known as:  NEURONTIN Take 100 mg by mouth at bedtime.   guaiFENesin 600 MG 12 hr tablet Commonly known as:  MUCINEX Take 600 mg by mouth 2 (two) times daily as needed for cough or to loosen phlegm.   HYDROcodone-acetaminophen 5-325 MG tablet Commonly known as:  NORCO/VICODIN Take 1 tablet by mouth every 6 hours as needed for moderate pain scale 1-5, take 2 tablets by mouth every 6 hours as needed for moderate pain scale 6-10   hydroxyurea 500 MG capsule Commonly known as:  HYDREA May take with food to minimize GI side effects. Take 500 mg daily from Mondays to Fridays and take 1000 mg on Saturdays and Sundays   levothyroxine 112 MCG tablet Commonly known as:  SYNTHROID, LEVOTHROID Take 112 mcg by mouth daily before breakfast.   Magnesium 250 MG Tabs Take 250 mg by mouth daily.   Melatonin 5 MG Caps Take 5 mg by mouth at bedtime.   polycarbophil 625 MG tablet Commonly known as:  FIBERCON Take 2 tablets (1,250 mg total) by mouth 2 (two) times daily.    potassium chloride 10 MEQ tablet Commonly known as:  K-DUR,KLOR-CON Take 1 tablet (10 mEq total) by mouth 2 (two) times daily.   PRESERVISION AREDS 2 Caps Take 1 capsule by mouth 2 (two) times daily.   Rivaroxaban 15 MG Tabs tablet Commonly known as:  XARELTO Take 15 mg by mouth daily with supper.       Review of Systems:  Review of Systems  Constitutional: Positive for malaise/fatigue. Negative for chills,  fever and weight loss.  HENT: Positive for hearing loss. Negative for congestion.   Eyes: Negative for blurred vision.  Respiratory: Negative for cough and shortness of breath.   Cardiovascular: Negative for chest pain and leg swelling.  Gastrointestinal: Positive for constipation. Negative for abdominal pain, blood in stool, diarrhea and melena.  Genitourinary: Negative for dysuria, frequency and urgency.  Musculoskeletal: Positive for back pain and joint pain. Negative for falls, myalgias and neck pain.  Skin: Negative for itching and rash.  Neurological: Negative for dizziness, loss of consciousness and weakness.  Endo/Heme/Allergies: Bruises/bleeds easily.  Psychiatric/Behavioral: Negative for depression and memory loss.    Health Maintenance  Topic Date Due  . TETANUS/TDAP  08/03/1949  . INFLUENZA VACCINE  09/09/2015  . DEXA SCAN  Completed  . ZOSTAVAX  Completed  . PNA vac Low Risk Adult  Completed    Physical Exam: Vitals:   11/05/15 1551  BP: 140/68  Pulse: (!) 47  Temp: 98.4 F (36.9 C)  TempSrc: Oral  SpO2: 95%  Weight: 116 lb (52.6 kg)   Body mass index is 22.65 kg/m. Physical Exam  Constitutional: She is oriented to person, place, and time. She appears well-developed and well-nourished. No distress.  Cardiovascular: Regular rhythm and intact distal pulses.   bradycardic  Pulmonary/Chest: Effort normal and breath sounds normal.  Abdominal: Soft. Bowel sounds are normal. She exhibits distension. There is no tenderness.  Musculoskeletal: She  exhibits tenderness.  Still walking with a limp, but no longer swinging leg, continues walker use  Neurological: She is alert and oriented to person, place, and time.  Skin: Skin is warm and dry.   Labs reviewed: Basic Metabolic Panel:  Recent Labs  07/18/15 1506  10/08/15 0412 10/09/15 0416 10/10/15 0410  NA  --   < > 141 139 135  K  --   < > 3.4* 4.2 4.4  CL  --   < > 101 103 103  CO2  --   < > 33* 29 26  GLUCOSE  --   < > 86 134* 108*  BUN  --   < > 18 20 27*  CREATININE  --   < > 0.80 0.80 0.83  CALCIUM  --   < > 10.0 9.1 9.4  TSH 2.21  --   --   --   --   < > = values in this interval not displayed. Liver Function Tests:  Recent Labs  11/29/14 1518 06/20/15 0903 10/06/15 0408  AST 22 17 18   ALT 17 11 18   ALKPHOS 61 72 49  BILITOT 0.7 0.7 0.6  PROT 6.8 6.2 6.5  ALBUMIN 4.3 4.6 4.3   No results for input(s): LIPASE, AMYLASE in the last 8760 hours. No results for input(s): AMMONIA in the last 8760 hours. CBC:  Recent Labs  10/09/15 0416 10/10/15 0410 11/03/15 1449  WBC 16.5* 13.0* 9.7  NEUTROABS 15.3* 10.3* 7.3*  HGB 13.6 12.4 13.9  HCT 41.9 37.8 43.5  MCV 117.0* 116.3* 114.6*  PLT 497* 441* 620*   Lipid Panel:  Recent Labs  07/29/15 1200  CHOL 162  HDL 101*  LDLCALC 42  TRIG 100   No results found for: HGBA1C  Procedures since last visit: Dg C-arm 1-60 Min-no Report  Result Date: 10/20/2015 Fluoroscopy was utilized by the requesting physician.  No radiographic interpretation.   Dg Femur, Min 2 Views Right  Result Date: 10/08/2015 CLINICAL DATA:  Fracture EXAM: RIGHT FEMUR 2 VIEWS COMPARISON:  10/05/2015 FINDINGS:  An intra medullary rod as well as stabilization screws in the femoral neck, intertrochanteric region, and distal femur are noted. Proximal femur lateral stress fracture is noted. No breakage or loosening of the hardware. Anatomic alignment. IMPRESSION: ORIF femur as described. Electronically Signed   By: Marybelle Killings M.D.   On:  10/08/2015 22:18    Assessment/Plan 1. Femoral shaft fracture, right, closed, initial encounter -s/p repair -healing slowly -is walking with walker -plans to go home tomorrow  2. Status post-operative repair of hip fracture -f/u with Dr. Alvan Dame as planned  3. Senile osteoporosis -will plan to give prolia as planned unless Dr. Alvan Dame recommends we hold this for another month or so while fracture heals  4. Midline low back pain with sciatica, sciatica laterality unspecified -ongoing, but worst remaining pain is related to the hip in the groin area and moving around on the thigh  5. Bradycardia with 31 - 40 beats per minute -ongoing since rehab stay postop for hip fx repair -has been slightly bradycardic for several months, but not as low as upper 30s like it's sometimes been recently -advised f/u with Dr. Aundra Dubin to evaluate further  Labs/tests ordered:  No orders of the defined types were placed in this encounter.  Next appt:  12/31/2015  Antwon Rochin L. Brynna Dobos, D.O. [facility] [address], [place] [address] Cell Phone (Mon-Fri 8am-5pm):  [phone] On Call:  [phone] & follow prompts after 5pm & weekends Office Phone:  [phone] Office Fax:  [phone]

## 2015-11-06 ENCOUNTER — Telehealth: Payer: Self-pay | Admitting: Cardiology

## 2015-11-06 NOTE — Telephone Encounter (Signed)
ERROR

## 2015-11-06 NOTE — Progress Notes (Signed)
Sarah Mays, please have her decrease amiodarone back to 100 mg daily. Needs to have her pulse checked daily and call in readings next week.

## 2015-11-06 NOTE — Progress Notes (Deleted)
Cardiology Office Note    Date:  11/06/2015   ID:  Sarah Mays, DOB Jun 22, 1930, MRN [id number]  PCP:  Hollace Kinnier, DO  Cardiologist:  Dr. Aundra Dubin  Chief Complaint: Hospital follow up s/p    /  Months follow up  History of Present Illness:   Sarah Mays is a 82 y.o. female with hx of PAF, atrial flutter, chronic diastolic CHF, HTN and HLD who presented for   She was diagnosed with afib 11/2011. Holter monitor in 11/13 showed atrial fibrillation with average rate 65. Back in NSR on amiodarone.  is anticoagulated with Xarelto 15 mg daily which is appropriate for her creatinine clearance.  In 7/16, she was admitted with chest pain. F/u Cardiolite showed no ischemia or infarction. In 5/17, she was found to be in atrial flutter. At a followup appt at the atrial fibrillation clinic, she was back in NSR. Her amio decreased to 100mg  given recent breakthrough atrial flutter when last seen by Dr. Aundra Dubin 09/01/15.   Last echo 08/05/15 showed LV EF of 60-65%, no wm abnormality, Ascending aortic diameter: 38 mm (S), PA pressure of 41 mm Hg.   Recently admitted 8/27-9/1 for right hip pain. MRI showing abnormal signal in the proximal right femoral shaft with small focal cortical defect and surrounding soft tissue infiltration, suggestive of osteomyelitis with sinus tract. Patient was evaluated with orthopedics, concluded the patient had stress fracture with impending fracture risk, she underwent a prophylactic intramedullary nailing of the right femur.   She hydralazine stopped at facility due to BP *** ?. Her rate was   Her today for follow up.   Past Medical History:  Diagnosis Date  . Abnormal stress test    a. 11/2011 Ex MV: EF 80%, small, partially reversible anteroapical defect->mild ischemia vs attenuation-->med Rx.  [name] Kitchen Alopecia, unspecified   . Backache, unspecified   . Chronic diastolic CHF (congestive heart failure) ([place])    a. 11/2013 Echo: EF 60-65%, no rwma, mild TR, PASP  36mmHg.  . Closed fracture of lower end of radius with ulna   . Deafness    Left, s/p multiple surgeries  . Edema   . Essential hypertension   . Gouty arthropathy, unspecified   . Headache(784.0)   . Hypercalcemia   . Hyperpotassemia   . Hyposmolality and/or hyponatremia   . Hypothyroidism   . Insomnia, unspecified   . Macular degeneration    Left, s/p inj. tx  . Mitral valve disorders   . Neck mass    right, w/u including MRI negative  . Other malaise and fatigue   . PAF (paroxysmal atrial fibrillation) (HCC)    a. on amio (02/2012 mild obstruction on PFT's) and xarelto.  . Polycythemia vera(238.4)   . Pure hypercholesterolemia   . Senile osteoporosis    Reclast in the past  . Tricuspid valve disorders, specified as nonrheumatic     Past Surgical History:  Procedure Laterality Date  . ABLATION  08/28/14   Back   . BREAST BIOPSY  06/18/1998   left  . BREAST BIOPSY    . FEMUR IM NAIL Right 10/08/2015   Procedure: INTRAMEDULLARY (IM) NAIL FEMORAL RIGHT;  Surgeon: Paralee Cancel, MD;  Location: [facility];  Service: Orthopedics;  Laterality: Right;  . TONSILLECTOMY    . TOTAL ABDOMINAL HYSTERECTOMY    . TYMPANOPLASTY Bilateral     Current Medications: Prior to Admission medications   Medication Sig Start Date End Date Taking? Authorizing Provider  aluminum hydroxide (AMPHOJEL/ALTERNAGEL)  320 MG/5ML suspension Take 15-30 mLs by mouth every 4 (four) hours as needed for indigestion. 10/10/15   Mauricio Gerome Apley, MD  amiodarone (PACERONE) 200 MG tablet Take 200 mg by mouth daily.    Historical Provider, MD  bisacodyl (DULCOLAX) 10 MG suppository Place 1 suppository (10 mg total) rectally daily as needed for moderate constipation. 10/10/15   Mauricio Gerome Apley, MD  cholecalciferol (VITAMIN D) 1000 UNITS tablet Take 2,000 Units by mouth daily.     Historical Provider, MD  docusate sodium (COLACE) 100 MG capsule Take 1 capsule (100 mg total) by mouth 2 (two) times daily. 10/10/15    Mauricio Gerome Apley, MD  fentaNYL (DURAGESIC - DOSED MCG/HR) 25 MCG/HR patch Place 1 patch (25 mcg total) onto the skin every 3 (three) days. 10/10/15   Mauricio Gerome Apley, MD  furosemide (LASIX) 40 MG tablet Take 60 mg by mouth at bedtime.    Historical Provider, MD  furosemide (LASIX) 80 MG tablet Take 80 mg by mouth every morning.    Historical Provider, MD  gabapentin (NEURONTIN) 100 MG capsule Take 100 mg by mouth at bedtime.     Historical Provider, MD  guaiFENesin (MUCINEX) 600 MG 12 hr tablet Take 600 mg by mouth 2 (two) times daily as needed for cough or to loosen phlegm.    Historical Provider, MD  HYDROcodone-acetaminophen (NORCO/VICODIN) 5-325 MG tablet Take 1 tablet by mouth every 6 hours as needed for moderate pain scale 1-5, take 2 tablets by mouth every 6 hours as needed for moderate pain scale 6-10 10/30/15   Tiffany L Reed, DO  hydroxyurea (HYDREA) 500 MG capsule May take with food to minimize GI side effects. Take 500 mg daily from Mondays to Fridays and take 1000 mg on Saturdays and Sundays 11/03/15   Heath Lark, MD  levothyroxine (SYNTHROID, LEVOTHROID) 112 MCG tablet Take 112 mcg by mouth daily before breakfast.    Historical Provider, MD  Magnesium 250 MG TABS Take 250 mg by mouth daily.     Historical Provider, MD  Melatonin 5 MG CAPS Take 5 mg by mouth at bedtime.     Historical Provider, MD  Multiple Vitamins-Minerals (PRESERVISION AREDS 2) CAPS Take 1 capsule by mouth 2 (two) times daily.     Historical Provider, MD  polycarbophil (FIBERCON) 625 MG tablet Take 2 tablets (1,250 mg total) by mouth 2 (two) times daily. 10/10/15   Mauricio Gerome Apley, MD  potassium chloride (K-DUR,KLOR-CON) 10 MEQ tablet Take 1 tablet (10 mEq total) by mouth 2 (two) times daily. 10/10/15   Mauricio Gerome Apley, MD  Rivaroxaban (XARELTO) 15 MG TABS tablet Take 15 mg by mouth daily with supper.    Historical Provider, MD    Allergies:   Amlodipine   Social History   Social History  .  Marital status: Married    Spouse name: [name]  . Number of children: 2  . Years of education: 61   Social History Main Topics  . Smoking status: Former Smoker    Types: Cigarettes    Quit date: 01/19/1984  . Smokeless tobacco: Never Used  . Alcohol use 0.6 oz/week    1 Glasses of wine per week     Comment: 2 per day/ 14 a week  . Drug use: No  . Sexual activity: Not Currently   Other Topics Concern  . Not on file   Social History Narrative   Patient is Married 1955. 2 kids. 45 grandkid-83 years old in 2016. 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).    Lives in apartment,  Independent Living  section at [facility] since 02/2013.      Stay at home mother.       No Smoking history, Mod. alcohol use.   Patient has a living will, POA      Hobbies: CPU and painting              Family History:  The patient's family history includes Breast cancer in her daughter; Cancer in her daughter, sister, sister, and sister; Heart disease in her father and sister; Hypertension in her father; Ovarian cancer (age of onset: 80) in her mother. ***  ROS:   Please see the history of present illness.    ROS All other systems reviewed and are negative.   PHYSICAL EXAM:   VS:  There were no vitals taken for this visit.   GEN: Well nourished, well developed, in no acute distress  HEENT: normal  Neck: no JVD, carotid bruits, or masses Cardiac: ***RRR; no murmurs, rubs, or gallops,no edema  Respiratory:  clear to auscultation bilaterally, normal work of breathing GI: soft, nontender, nondistended, + BS MS: no deformity or atrophy  Skin: warm and dry, no rash Neuro:  Alert and Oriented x 3, Strength and sensation are intact Psych: euthymic mood, full affect  Wt Readings from Last 3 Encounters:  11/05/15 116 lb (52.6 kg)  11/03/15 117 lb 8 oz (53.3 kg)  10/15/15 118 lb (53.5 kg)      Studies/Labs Reviewed:   EKG:  EKG is ordered today.  The ekg ordered today demonstrates  ***  Recent Labs: 07/18/2015: Brain Natriuretic Peptide 401.4; TSH 2.21 10/06/2015: ALT 18 10/10/2015: BUN 27; Creatinine, Ser 0.83; Potassium 4.4; Sodium 135 11/03/2015: HGB 13.9; Platelets 620   Lipid Panel    Component Value Date/Time   CHOL 162 07/29/2015 1200   TRIG 100 07/29/2015 1200   HDL 101 (A) 07/29/2015 1200   CHOLHDL 2 02/22/2012 1218   VLDL 18.4 02/22/2012 1218   LDLCALC 42 07/29/2015 1200    Additional studies/ records that were reviewed today include:   Echocardiogram: 08/05/15 LV EF: 60% -   65%  ------------------------------------------------------------------- Indications:      Shortness of breath (R06.02).  ------------------------------------------------------------------- History:   PMH:   Atrial fibrillation.  Coronary artery disease. Congestive heart failure.  Risk factors:  Sick sinus syndrome. Former tobacco use. Hypertension.  ------------------------------------------------------------------- Study Conclusions  - Left ventricle: The cavity size was normal. Systolic function was   normal. The estimated ejection fraction was in the range of 60%   to 65%. Wall motion was normal; there were no regional wall   motion abnormalities. - Aorta: Ascending aortic diameter: 38 mm (S). - Ascending aorta: The ascending aorta was mildly dilated. - Mitral valve: There was mild regurgitation. - Left atrium: The atrium was mildly dilated. - Pulmonary arteries: Systolic pressure was mildly increased. PA   peak pressure: 41 mm Hg (S). - Pericardium, extracardiac: A trivial pericardial effusion was   identified posterior to the heart. There was no evidence of   hemodynamic compromise.  Impressions:  - Compared to the prior study, there has been no significant   interval change.    ASSESSMENT & PLAN:    1. ***    Medication Adjustments/Labs and Tests Ordered: Current medicines are reviewed at length with the patient today.  Concerns regarding  medicines are outlined above.  Medication changes, Labs and Tests ordered today are listed in  the Patient Instructions below. There are no Patient Instructions on file for this visit.   Jarrett Soho, [place]  11/06/2015 4:13 PM    [facility] [place], [place], [place]  [address] Phone: [phone]; Fax: [phone]

## 2015-11-07 ENCOUNTER — Telehealth: Payer: Self-pay | Admitting: *Deleted

## 2015-11-07 ENCOUNTER — Ambulatory Visit: Payer: Medicare Other | Admitting: Physician Assistant

## 2015-11-07 ENCOUNTER — Encounter: Payer: Self-pay | Admitting: Physician Assistant

## 2015-11-07 ENCOUNTER — Ambulatory Visit (INDEPENDENT_AMBULATORY_CARE_PROVIDER_SITE_OTHER): Payer: Medicare Other | Admitting: Physician Assistant

## 2015-11-07 VITALS — BP 110/52 | HR 47 | Ht 60.0 in | Wt 115.4 lb

## 2015-11-07 DIAGNOSIS — M79604 Pain in right leg: Secondary | ICD-10-CM | POA: Diagnosis not present

## 2015-11-07 DIAGNOSIS — M25551 Pain in right hip: Secondary | ICD-10-CM | POA: Diagnosis not present

## 2015-11-07 DIAGNOSIS — I10 Essential (primary) hypertension: Secondary | ICD-10-CM | POA: Diagnosis not present

## 2015-11-07 DIAGNOSIS — I2510 Atherosclerotic heart disease of native coronary artery without angina pectoris: Secondary | ICD-10-CM | POA: Diagnosis not present

## 2015-11-07 DIAGNOSIS — I5032 Chronic diastolic (congestive) heart failure: Secondary | ICD-10-CM

## 2015-11-07 DIAGNOSIS — I480 Paroxysmal atrial fibrillation: Secondary | ICD-10-CM | POA: Diagnosis not present

## 2015-11-07 DIAGNOSIS — M62561 Muscle wasting and atrophy, not elsewhere classified, right lower leg: Secondary | ICD-10-CM | POA: Diagnosis not present

## 2015-11-07 DIAGNOSIS — Z09 Encounter for follow-up examination after completed treatment for conditions other than malignant neoplasm: Secondary | ICD-10-CM

## 2015-11-07 DIAGNOSIS — R2689 Other abnormalities of gait and mobility: Secondary | ICD-10-CM | POA: Diagnosis not present

## 2015-11-07 DIAGNOSIS — M629 Disorder of muscle, unspecified: Secondary | ICD-10-CM | POA: Diagnosis not present

## 2015-11-07 DIAGNOSIS — R001 Bradycardia, unspecified: Secondary | ICD-10-CM

## 2015-11-07 DIAGNOSIS — R278 Other lack of coordination: Secondary | ICD-10-CM | POA: Diagnosis not present

## 2015-11-07 MED ORDER — AMIODARONE HCL 100 MG PO TABS: 100.0000 mg | ORAL_TABLET | Freq: Every day | ORAL | 3 refills | Status: DC

## 2015-11-07 NOTE — Telephone Encounter (Signed)
Progress Notes by Larey Dresser, MD at 11/03/2015 3:15 PM   Author: Larey Dresser, MD Author Type: Physician  Filed:  11/06/2015 9:19 PM  Note Status: Signed Cosign: Cosign Not Required  Encounter Date:  11/03/2015  Editor: Larey Dresser, MD (Physician)    Webb Silversmith, please have her decrease amiodarone back to 100 mg daily. Needs to have her pulse checked daily and call in readings next week.      11/07/15 LMTCB at [phone], received voice mail for Triage.

## 2015-11-07 NOTE — Telephone Encounter (Signed)
-----   Message from Richmond Campbell, LPN sent at [id number]  7:55 AM EDT -----   ----- Message ----- From: Larey Dresser, MD Sent: 11/06/2015   9:19 PM To: Katrine Coho, RN, [facility] Triage    ----- Message ----- From: Heath Lark, MD Sent: 11/03/2015   3:32 PM To: Larey Dresser, MD  Hi Dr. Aundra Dubin, [place] she is bradycardiac with HR at 37 in my office. She is not symptomatic.

## 2015-11-07 NOTE — Telephone Encounter (Signed)
I saw the note.  Glad to see she got this addressed.

## 2015-11-07 NOTE — Patient Instructions (Addendum)
Medication Instructions:  Your physician recommends that you continue on your current medications as directed. Please refer to the Current Medication list given to you today.   Labwork: None ordered  Testing/Procedures: None ordered  Follow-Up: Your physician recommends that you schedule a follow-up appointment in: WITH DR. Aundra Dubin AS PLANNED   Any Other Special Instructions Will Be Listed Below (If Applicable).  HAVE YOUR BLOOD PRESSURE AND PULSE CHECKED DAILY AND RECORD THEM AND CALL INTO THE OFFICE NEXT WEEK AND REPORT TO DR. Aundra Dubin   If you need a refill on your cardiac medications before your next appointment, please call your pharmacy.

## 2015-11-07 NOTE — Progress Notes (Signed)
Cardiology Office Note    Date:  11/07/2015   ID:  Sarah Mays, DOB 02-26-1930, MRN [id number]  PCP:  Hollace Kinnier, DO  Cardiologist:  Dr. Aundra Dubin  Chief Complaint: Bradycardia  History of Present Illness:   Sarah Mays is a 82 y.o. female PAF/aflutter on Xarelto for anticoagulation, bradycardia on amiodarone, chronic diastolic CHF, HTN, HLD and polycythemia vera who presented for evaluation of bradycardia.   She was diagnosed with afib 11/2011. Holter monitor in 11/13 showed atrial fibrillation with average rate 65. Back in NSR on amiodarone.  In 7/16, she was admitted with chest pain follow up cardiolite showed no ischemia or infarction. Hx fo of bradycardia in SR and amiodarone was decreased to 100 mg in October 2016 for HR of 48. She was again seen in afib clinic with symptomatic atrial flutter 07/01/15--> amiodarone increase to 200mg  BID.  At a followup app 07/03/15 she was back in NSR. Multiple time dose adjustment due to breath through afib/aflutter.   Last echo 07/2015 showed LV ef of 60-65%, no WM abnormality, ascending aortic diameter 38 mm (S), mild MR, ,mild dilated LA, PA pressure of 41 mm hg.   She was in NSR when last seen by Dr. Aundra Dubin 09/01/15 on amiodarone 200mg  qd.   Recently admitted 8/27-9/1 for right hip pain after failed outpatient therapy. MRI was concerning for osteomyelitis. Patient was evaluated with orthopedics, concluded the patient had stress fracture with impending fracture risk. She subsequently underwent a prophylactic intramedullary nailing of the right femur. She tolerated well physical therapy, with recommendations for skilled nursing facility for further rehabilitation.  At facility staff was concerned about bradycardia and ? Low blood pressure. Subsequently hydralazine discontinued. She patient was seen by Dr. Alvy Bimler 11/03/15  for f/u where noted to be significant bradycardiac at rate of 37. She was asymptomatic. Her Hydroxyurea increased due to  recent thrombocytosis. HR of 47 when visit with Dr. Mariea Clonts 11/05/15.    Dr. Aundra Dubin has advised to decreased Amiodarone to 100mg  qd.   Here for follow up. She feels weak and "no energy". The patient denies nausea, vomiting, fever, chest pain, palpitations, shortness of breath, orthopnea, PND, dizziness, syncope, cough, congestion, abdominal pain, hematochezia, melena, lower extremity edema.  Past Medical History:  Diagnosis Date  . Abnormal stress test    a. 11/2011 Ex MV: EF 80%, small, partially reversible anteroapical defect->mild ischemia vs attenuation-->med Rx.  [name] Kitchen Alopecia, unspecified   . Backache, unspecified   . Chronic diastolic CHF (congestive heart failure) ([place])    a. 11/2013 Echo: EF 60-65%, no rwma, mild TR, PASP 49mmHg.  . Closed fracture of lower end of radius with ulna   . Deafness    Left, s/p multiple surgeries  . Edema   . Essential hypertension   . Gouty arthropathy, unspecified   . Headache(784.0)   . Hypercalcemia   . Hyperpotassemia   . Hyposmolality and/or hyponatremia   . Hypothyroidism   . Insomnia, unspecified   . Macular degeneration    Left, s/p inj. tx  . Mitral valve disorders   . Neck mass    right, w/u including MRI negative  . Other malaise and fatigue   . PAF (paroxysmal atrial fibrillation) (HCC)    a. on amio (02/2012 mild obstruction on PFT's) and xarelto.  . Polycythemia vera(238.4)   . Pure hypercholesterolemia   . Senile osteoporosis    Reclast in the past  . Tricuspid valve disorders, specified as nonrheumatic     Past  Surgical History:  Procedure Laterality Date  . ABLATION  08/28/14   Back   . BREAST BIOPSY  06/18/1998   left  . BREAST BIOPSY    . FEMUR IM NAIL Right 10/08/2015   Procedure: INTRAMEDULLARY (IM) NAIL FEMORAL RIGHT;  Surgeon: Paralee Cancel, MD;  Location: [facility];  Service: Orthopedics;  Laterality: Right;  . TONSILLECTOMY    . TOTAL ABDOMINAL HYSTERECTOMY    . TYMPANOPLASTY Bilateral     Current Medications:       Prior to Admission medications   Medication Sig Start Date End Date Taking? Authorizing Provider  amiodarone (PACERONE) 100 MG tablet Take 1 tablet (100 mg total) by mouth daily. 11/07/15  Yes Tiffany L Reed, DO  bisacodyl (DULCOLAX) 10 MG suppository Place 1 suppository (10 mg total) rectally daily as needed for moderate constipation. 10/10/15  Yes Mauricio Gerome Apley, MD  cholecalciferol (VITAMIN D) 1000 UNITS tablet Take 2,000 Units by mouth daily.    Yes Historical Provider, MD  docusate sodium (COLACE) 100 MG capsule Take 1 capsule (100 mg total) by mouth 2 (two) times daily. 10/10/15  Yes Mauricio Gerome Apley, MD  fentaNYL (DURAGESIC - DOSED MCG/HR) 25 MCG/HR patch Place 1 patch (25 mcg total) onto the skin every 3 (three) days. 10/10/15  Yes Mauricio Gerome Apley, MD  furosemide (LASIX) 40 MG tablet Take 60 mg by mouth at bedtime.   Yes Historical Provider, MD  furosemide (LASIX) 80 MG tablet Take 160 mg by mouth every morning.    Yes Historical Provider, MD  gabapentin (NEURONTIN) 100 MG capsule Take 100 mg by mouth at bedtime.    Yes Historical Provider, MD  guaiFENesin (MUCINEX) 600 MG 12 hr tablet Take 600 mg by mouth 2 (two) times daily as needed for cough or to loosen phlegm.   Yes Historical Provider, MD  hydroxyurea (HYDREA) 500 MG capsule May take with food to minimize GI side effects. Take 500 mg daily from Mondays to Fridays and take 1000 mg on Saturdays and Sundays 11/03/15  Yes Ni Gorsuch, MD  levothyroxine (SYNTHROID, LEVOTHROID) 112 MCG tablet Take 112 mcg by mouth daily before breakfast.   Yes Historical Provider, MD  Magnesium 250 MG TABS Take 250 mg by mouth daily.    Yes Historical Provider, MD  Melatonin 5 MG CAPS Take 5 mg by mouth at bedtime.    Yes Historical Provider, MD  Multiple Vitamins-Minerals (PRESERVISION AREDS 2) CAPS Take 1 capsule by mouth 2 (two) times daily.    Yes Historical Provider, MD  polycarbophil (FIBERCON) 625 MG tablet Take 2 tablets (1,250 mg  total) by mouth 2 (two) times daily. 10/10/15  Yes Mauricio Gerome Apley, MD  potassium chloride (K-DUR,KLOR-CON) 10 MEQ tablet Take 1 tablet (10 mEq total) by mouth 2 (two) times daily. 10/10/15  Yes Mauricio Gerome Apley, MD  Rivaroxaban (XARELTO) 15 MG TABS tablet Take 15 mg by mouth daily with supper.   Yes Historical Provider, MD       Allergies:   Amlodipine   Social History   Social History  . Marital status: Married    Spouse name: [name]  . Number of children: 2  . Years of education: 40   Social History Main Topics  . Smoking status: Former Smoker    Types: Cigarettes    Quit date: 01/19/1984  . Smokeless tobacco: Never Used  . Alcohol use 0.6 oz/week    1 Glasses of wine per week     Comment: 2 per day/  14 a week  . Drug use: No  . Sexual activity: Not Currently   Other Topics Concern  . 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 85 grandkid-9 years old in 2016. College graduate [name] [place]).    Lives in apartment,  Independent Living  section at [facility] since 02/2013.      Stay at home mother.       No Smoking history, Mod. alcohol use.   Patient has a living will, POA      Hobbies: CPU and painting              Family History:  The patient's family history includes Breast cancer in her daughter; Cancer in her daughter, sister, sister, and sister; Heart disease in her father and sister; Hypertension in her father; Ovarian cancer (age of onset: 56) in her mother.   ROS:   Please see the history of present illness.    ROS All other systems reviewed and are negative.   PHYSICAL EXAM:   VS:  BP (!) 110/52   Pulse (!) 47   Ht 5' (1.524 m)   Wt 115 lb 6.4 oz (52.3 kg)   BMI 22.54 kg/m    GEN: thin frail female  in no acute distress  HEENT: normal  Neck: no JVD, carotid bruits, or masses Cardiac: Irregular bradycardic ; no murmurs, rubs, or gallops,no edema  Respiratory:  clear to auscultation  bilaterally, normal work of breathing GI: soft, nontender, nondistended, + BS MS: no deformity or atrophy  Skin: warm and dry, no rash Neuro:  Alert and Oriented x 3, Strength and sensation are intact Psych: euthymic mood, full affect  Wt Readings from Last 3 Encounters:  11/07/15 115 lb 6.4 oz (52.3 kg)  11/05/15 116 lb (52.6 kg)  11/03/15 117 lb 8 oz (53.3 kg)      Studies/Labs Reviewed:   EKG:  EKG is ordered today.  The ekg ordered today demonstrates afib at rate of 47 bpm.   Recent Labs: 07/18/2015: Brain Natriuretic Peptide 401.4; TSH 2.21 10/06/2015: ALT 18 10/10/2015: BUN 27; Creatinine, Ser 0.83; Potassium 4.4; Sodium 135 11/03/2015: HGB 13.9; Platelets 620   Lipid Panel    Component Value Date/Time   CHOL 162 07/29/2015 1200   TRIG 100 07/29/2015 1200   HDL 101 (A) 07/29/2015 1200   CHOLHDL 2 02/22/2012 1218   VLDL 18.4 02/22/2012 1218   LDLCALC 42 07/29/2015 1200    Additional studies/ records that were reviewed today include:   Echocardiogram: 07/2015 LV EF: 60% -   65%  ------------------------------------------------------------------- Indications:      Shortness of breath (R06.02).  ------------------------------------------------------------------- History:   PMH:   Atrial fibrillation.  Coronary artery disease. Congestive heart failure.  Risk factors:  Sick sinus syndrome. Former tobacco use. Hypertension.  ------------------------------------------------------------------- Study Conclusions  - Left ventricle: The cavity size was normal. Systolic function was   normal. The estimated ejection fraction was in the range of 60%   to 65%. Wall motion was normal; there were no regional wall   motion abnormalities. - Aorta: Ascending aortic diameter: 38 mm (S). - Ascending aorta: The ascending aorta was mildly dilated. - Mitral valve: There was mild regurgitation. - Left atrium: The atrium was mildly dilated. - Pulmonary arteries: Systolic pressure  was mildly increased. PA   peak pressure: 41 mm Hg (S). - Pericardium, extracardiac: A trivial pericardial effusion was   identified posterior to the heart. There  was no evidence of   hemodynamic compromise.  Impressions:  - Compared to the prior study, there has been no significant   interval change.   Cardiolite (7/16) with EF 64%, no ischemia or infarction.     ASSESSMENT & PLAN:   1. PAF - Multiple breakthrough recently requiring dose adjusted of amiodarone due to bradycardia. She is off nebivolol due to bradycardia (past). Noted HR of 37 during office visit with Dr. Alvy Bimler 11/03/15.  - Today she is in afib at rate of 47 bpm.  Feels weak and "no engery", likely her symptoms due to both bradycardia and afib. No other symptoms.  - Will decrease to Amiodarone 100mg  qd per Dr. Claris Gladden recommendation. She need to have check her pulse and blood pressure daily and call reading to Dr. Aundra Dubin. If stays low might consider 24/48 hours holder to determine baseline rate and r/o any significant bradycardia.  - EF of 60-65% on last echo 07/2015 without WM abnormality.   2. Bradycardia - As above  3. CAD - No chest pain.  7/16 Cardiolite showed no ischemia or infarction.   4. HTN - BP of 110/52 today. Continue lasix.   5. Chronic diastolic CHF - Euvolemic. Continue Lasix 80mg  AM and 60mg  PM.    Medication Adjustments/Labs and Tests Ordered: Current medicines are reviewed at length with the patient today.  Concerns regarding medicines are outlined above.  Medication changes, Labs and Tests ordered today are listed in the Patient Instructions below. Patient Instructions  Medication Instructions:  Your physician recommends that you continue on your current medications as directed. Please refer to the Current Medication list given to you today.   Labwork: None ordered  Testing/Procedures: None ordered  Follow-Up: Your physician recommends that you schedule a follow-up  appointment in: WITH DR. Aundra Dubin AS PLANNED   Any Other Special Instructions Will Be Listed Below (If Applicable).  HAVE YOUR BLOOD PRESSURE AND PULSE CHECKED DAILY AND RECORD THEM AND CALL INTO THE OFFICE NEXT WEEK AND REPORT TO DR. Aundra Dubin   If you need a refill on your cardiac medications before your next appointment, please call your pharmacy.      Jarrett Soho, [place]  11/07/2015 2:06 PM    [facility] [place], [place], [place]  [address] Phone: [phone]; Fax: [phone]

## 2015-11-07 NOTE — Progress Notes (Signed)
Let's have her see EP for any other options.

## 2015-11-07 NOTE — Telephone Encounter (Signed)
Amiodarone decreased at time of office visit today.

## 2015-11-07 NOTE — Telephone Encounter (Signed)
Shellee Milo with Dr. Larey Dresser office ([facility]) called and stated that Dr. Aundra Dubin adjusted patient's medication according to Dr. Cyndi Lennert OV note yesterday. Decreased her Amiodarone to 100mg  daily due to Bradycardia. Medication list updated. Shellee Milo has spoken with [name] at [facility] regarding medication change and appointment patient has today at Dr. Claris Gladden office to be seen.

## 2015-11-08 NOTE — Progress Notes (Signed)
Please refer this patient to EP per Dr. Aundra Dubin for evaluation of afib 1 weeks or first available

## 2015-11-10 ENCOUNTER — Telehealth: Payer: Self-pay | Admitting: Cardiology

## 2015-11-10 DIAGNOSIS — M62561 Muscle wasting and atrophy, not elsewhere classified, right lower leg: Secondary | ICD-10-CM | POA: Diagnosis not present

## 2015-11-10 DIAGNOSIS — R600 Localized edema: Secondary | ICD-10-CM | POA: Diagnosis not present

## 2015-11-10 DIAGNOSIS — S72001S Fracture of unspecified part of neck of right femur, sequela: Secondary | ICD-10-CM | POA: Diagnosis not present

## 2015-11-10 DIAGNOSIS — R2689 Other abnormalities of gait and mobility: Secondary | ICD-10-CM | POA: Diagnosis not present

## 2015-11-10 DIAGNOSIS — M25551 Pain in right hip: Secondary | ICD-10-CM | POA: Diagnosis not present

## 2015-11-10 DIAGNOSIS — M629 Disorder of muscle, unspecified: Secondary | ICD-10-CM | POA: Diagnosis not present

## 2015-11-10 DIAGNOSIS — R278 Other lack of coordination: Secondary | ICD-10-CM | POA: Diagnosis not present

## 2015-11-10 DIAGNOSIS — Z4789 Encounter for other orthopedic aftercare: Secondary | ICD-10-CM | POA: Diagnosis not present

## 2015-11-10 DIAGNOSIS — R2681 Unsteadiness on feet: Secondary | ICD-10-CM | POA: Diagnosis not present

## 2015-11-10 DIAGNOSIS — M79604 Pain in right leg: Secondary | ICD-10-CM | POA: Diagnosis not present

## 2015-11-10 NOTE — Telephone Encounter (Signed)
New message      Calling to get clarification on amiodarone medication

## 2015-11-10 NOTE — Telephone Encounter (Signed)
Patient wanted to make sure she was taking Amiodarone correctly.  States she is taking 100 mg once daily.  Patient instructed that this was correct dosage for her to be taking.  She thanks [place] for calling her back.

## 2015-11-12 ENCOUNTER — Telehealth: Payer: Self-pay | Admitting: Cardiology

## 2015-11-12 MED ORDER — HYDRALAZINE HCL 25 MG PO TABS: 25.0000 mg | ORAL_TABLET | Freq: Three times a day (TID) | ORAL | 1 refills | Status: DC

## 2015-11-12 NOTE — Telephone Encounter (Signed)
I spoke to patient.  She states she has been dizzy today and has headache.  She states last BP check was 160/70 w/ pulse of 50, this was at noon. She states BP medication was dc'd a couple of weeks age, she states she has taken all other medication as directed.

## 2015-11-12 NOTE — Telephone Encounter (Signed)
Pt is aware of Dr Claris Gladden recommendation to start hydralazine 25mg  tid. Do not hold unless SBP< 95. Pt verbalized understanding. Pt will check BP and heart rate daily.

## 2015-11-12 NOTE — Telephone Encounter (Signed)
Needs to start 25 mg tid hydralazine.  Do not hold unless SBP < 95.

## 2015-11-12 NOTE — Telephone Encounter (Signed)
Sarah Mays is calling about Sarah Mays Bp and it was 160/80 this morning and she rechecked after a hour and it was 160/70 and her pulse 50. Please call

## 2015-11-13 ENCOUNTER — Ambulatory Visit (INDEPENDENT_AMBULATORY_CARE_PROVIDER_SITE_OTHER): Payer: Medicare Other | Admitting: Internal Medicine

## 2015-11-13 ENCOUNTER — Encounter: Payer: Self-pay | Admitting: Internal Medicine

## 2015-11-13 VITALS — BP 120/68 | HR 54 | Ht 60.0 in | Wt 110.8 lb

## 2015-11-13 DIAGNOSIS — I2510 Atherosclerotic heart disease of native coronary artery without angina pectoris: Secondary | ICD-10-CM

## 2015-11-13 DIAGNOSIS — I480 Paroxysmal atrial fibrillation: Secondary | ICD-10-CM

## 2015-11-13 DIAGNOSIS — R001 Bradycardia, unspecified: Secondary | ICD-10-CM | POA: Diagnosis not present

## 2015-11-13 NOTE — Patient Instructions (Signed)
Medication Instructions:  Your physician recommends that you continue on your current medications as directed. Please refer to the Current Medication list given to you today.   Labwork: None ordered   Testing/Procedures: None ordered   Follow-Up:  Your physician recommends that you schedule a follow-up appointment in: 3 weeks with Dr Allred      Any Other Special Instructions Will Be Listed Below (If Applicable).     If you need a refill on your cardiac medications before your next appointment, please call your pharmacy.   

## 2015-11-14 NOTE — Progress Notes (Signed)
PCP:  Hollace Kinnier, DO Primary Cardiologist:  Dr Aundra Dubin  The patient presents today for routine electrophysiology followup.  I saw her last in 2015.  She did very well for 2 years. Earlier this year, she developed recurrent atrial arrhythmias for which her amiodarone was increased to 200mg  daily.  She has since maintained sinus rhythm.  Bradycardia has returned.  She has had some fatigue with this.  She denies any other symptoms. Today, she denies symptoms of palpitations, chest pain, shortness of breath, orthopnea, PND, lower extremity edema, dizziness, presyncope, syncope, or neurologic sequela.  The patient feels that she is tolerating medications without difficulties and is otherwise without complaint today.   Past Medical History:  Diagnosis Date  . Abnormal stress test    a. 11/2011 Ex MV: EF 80%, small, partially reversible anteroapical defect->mild ischemia vs attenuation-->med Rx.  [name] Kitchen Alopecia, unspecified   . Backache, unspecified   . Chronic diastolic CHF (congestive heart failure) ([place])    a. 11/2013 Echo: EF 60-65%, no rwma, mild TR, PASP 104mmHg.  . Closed fracture of lower end of radius with ulna   . Deafness    Left, s/p multiple surgeries  . Edema   . Essential hypertension   . Gouty arthropathy, unspecified   . Headache(784.0)   . Hypercalcemia   . Hyperpotassemia   . Hyposmolality and/or hyponatremia   . Hypothyroidism   . Insomnia, unspecified   . Macular degeneration    Left, s/p inj. tx  . Mitral valve disorders(424.0)   . Neck mass    right, w/u including MRI negative  . Other malaise and fatigue   . PAF (paroxysmal atrial fibrillation) ([facility])    a. on amio (02/2012 mild obstruction on PFT's) and xarelto.  . Polycythemia vera(238.4)   . Pure hypercholesterolemia   . Senile osteoporosis    Reclast in the past  . Tricuspid valve disorders, specified as nonrheumatic    Past Surgical History:  Procedure Laterality Date  . ABLATION  08/28/14   Back   .  BREAST BIOPSY  06/18/1998   left  . BREAST BIOPSY    . FEMUR IM NAIL Right 10/08/2015   Procedure: INTRAMEDULLARY (IM) NAIL FEMORAL RIGHT;  Surgeon: Paralee Cancel, MD;  Location: [facility];  Service: Orthopedics;  Laterality: Right;  . TONSILLECTOMY    . TOTAL ABDOMINAL HYSTERECTOMY    . TYMPANOPLASTY Bilateral     Current Outpatient Prescriptions  Medication Sig Dispense Refill  . amiodarone (PACERONE) 100 MG tablet Take 1 tablet (100 mg total) by mouth daily. 30 tablet 3  . bisacodyl (DULCOLAX) 10 MG suppository Place 1 suppository (10 mg total) rectally daily as needed for moderate constipation. 12 suppository 0  . cholecalciferol (VITAMIN D) 1000 UNITS tablet Take 2,000 Units by mouth daily.     [name] Kitchen docusate sodium (COLACE) 100 MG capsule Take 1 capsule (100 mg total) by mouth 2 (two) times daily. 10 capsule 0  . fentaNYL (DURAGESIC - DOSED MCG/HR) 25 MCG/HR patch Place 1 patch (25 mcg total) onto the skin every 3 (three) days. 3 patch 0  . furosemide (LASIX) 40 MG tablet Take 2 tablets (80 mg) by mouth in the morning and take 1.5 tablets (60 mg) in the evening    . gabapentin (NEURONTIN) 100 MG capsule Take 100 mg by mouth at bedtime.     [name] Kitchen guaiFENesin (MUCINEX) 600 MG 12 hr tablet Take 600 mg by mouth 2 (two) times daily as needed for cough or to loosen  phlegm.    . hydrALAZINE (APRESOLINE) 25 MG tablet Take 1 tablet (25 mg total) by mouth 3 (three) times daily. 270 tablet 1  . hydroxyurea (HYDREA) 500 MG capsule May take with food to minimize GI side effects. Take 500 mg daily from Mondays to Fridays and take 1000 mg on Saturdays and Sundays 40 capsule 9  . levothyroxine (SYNTHROID, LEVOTHROID) 112 MCG tablet Take 112 mcg by mouth daily before breakfast.    . Magnesium 250 MG TABS Take 250 mg by mouth daily.     . Melatonin 5 MG CAPS Take 5 mg by mouth at bedtime.     . Multiple Vitamins-Minerals (PRESERVISION AREDS 2) CAPS Take 1 capsule by mouth 2 (two) times daily.     . polycarbophil  (FIBERCON) 625 MG tablet Take 2 tablets (1,250 mg total) by mouth 2 (two) times daily. 60 tablet 0  . potassium chloride (K-DUR,KLOR-CON) 10 MEQ tablet Take 1 tablet (10 mEq total) by mouth 2 (two) times daily. 60 tablet 0  . Rivaroxaban (XARELTO) 15 MG TABS tablet Take 15 mg by mouth daily with supper.     No current facility-administered medications for this visit.     Allergies  Allergen Reactions  . Amlodipine Swelling and Other (See Comments)    Reaction:  Lower extremity swelling     Social History   Social History  . Marital status: Married    Spouse name: [name]  . Number of children: 2  . Years of education: 16   Occupational History  . Not on file.   Social History Main Topics  . Smoking status: Former Smoker    Types: Cigarettes    Quit date: 01/19/1984  . Smokeless tobacco: Never Used  . Alcohol use 0.6 oz/week    1 Glasses of wine per week     Comment: 2 per day/ 14 a week  . Drug use: No  . Sexual activity: Not Currently   Other Topics Concern  . Not on file   Social History Narrative   Patient is Married 1955. 2 kids. 40 grandkid-80 years old in 2016. College graduate [name] [place]).    Lives in apartment,  Independent Living  section at [place] since 02/2013.      Stay at home mother.       No Smoking history, Mod. alcohol use.   Patient has a living will, POA      Hobbies: CPU and painting             Family History  Problem Relation Age of Onset  . Hypertension Father   . Heart disease Father     patient does not know details.   . Ovarian cancer Mother 64  . Cancer Sister     colon  . Heart disease Sister   . Cancer Sister   . Cancer Sister     hodgkin disease  . Breast cancer Daughter   . Cancer Daughter     breast    ROS-  All systems are reviewed and are negative except as outlined in the HPI above  Physical Exam: Vitals:   11/13/15 1120  BP: 120/68  Pulse: (!) 54  Weight: 110 lb 12.8 oz (50.3  kg)  Height: 5' (1.524 m)    GEN- The patient is well appearing, alert and oriented x 3 today.   Head- normocephalic, atraumatic Eyes-  Sclera clear, conjunctiva pink Ears- hearing intact Oropharynx- clear Neck- supple, no JVP Lymph- no cervical  lymphadenopathy Lungs- Clear to ausculation bilaterally, normal work of breathing Heart- Regular rate and rhythm, no murmurs, rubs or gallops, PMI not laterally displaced GI- soft, NT, ND, + BS Extremities- no clubbing, cyanosis, or edema  ekg today reveals sinus bradycardia with PVCs, incomplete RBBB  Assessment and Plan:  1. Paroxysmal atrial fibrillation Presently maintaining sinus with amiodarone.  She is appropriately anticoagulated.  Her chads2vasc score is at least 4.   Amiodarone was recently reduced to 100mg  daily. No changes at this time  2. Sick sinus syndrome Presently, her heart rates are stable.  Her amiodarone has been reduced back to 100mg  daily.  We will continue to observe her heart rates.  She would like to avoid pacing at this time.  3. htn Stable No change required today  Return to see me in 3 weeks  Jeneen Rinks Curly Mackowski,MD

## 2015-11-17 DIAGNOSIS — R278 Other lack of coordination: Secondary | ICD-10-CM | POA: Diagnosis not present

## 2015-11-17 DIAGNOSIS — M79604 Pain in right leg: Secondary | ICD-10-CM | POA: Diagnosis not present

## 2015-11-17 DIAGNOSIS — R2689 Other abnormalities of gait and mobility: Secondary | ICD-10-CM | POA: Diagnosis not present

## 2015-11-17 DIAGNOSIS — M25551 Pain in right hip: Secondary | ICD-10-CM | POA: Diagnosis not present

## 2015-11-17 DIAGNOSIS — M62561 Muscle wasting and atrophy, not elsewhere classified, right lower leg: Secondary | ICD-10-CM | POA: Diagnosis not present

## 2015-11-17 DIAGNOSIS — M629 Disorder of muscle, unspecified: Secondary | ICD-10-CM | POA: Diagnosis not present

## 2015-11-18 ENCOUNTER — Telehealth: Payer: Self-pay | Admitting: Internal Medicine

## 2015-11-18 NOTE — Telephone Encounter (Signed)
New Message  Sarah Mays voiced from [facility] pts pulse rate 100 and usually it's 50.   Sarah Mays voiced she checked it three times and it was 100, pt came into their clinic today  Please f/u

## 2015-11-18 NOTE — Telephone Encounter (Signed)
Discussed with Dr Rayann Heman and he says to leave all medications as they are.  He is aware of the HR of 100 and does not want to make a change  I have left a message for Manuela Schwartz with this above information

## 2015-11-20 DIAGNOSIS — M84451A Pathological fracture, right femur, initial encounter for fracture: Secondary | ICD-10-CM | POA: Diagnosis not present

## 2015-11-20 DIAGNOSIS — Z4789 Encounter for other orthopedic aftercare: Secondary | ICD-10-CM | POA: Diagnosis not present

## 2015-11-21 ENCOUNTER — Other Ambulatory Visit: Payer: Self-pay | Admitting: *Deleted

## 2015-11-21 DIAGNOSIS — M629 Disorder of muscle, unspecified: Secondary | ICD-10-CM | POA: Diagnosis not present

## 2015-11-21 DIAGNOSIS — R2689 Other abnormalities of gait and mobility: Secondary | ICD-10-CM | POA: Diagnosis not present

## 2015-11-21 DIAGNOSIS — M25551 Pain in right hip: Secondary | ICD-10-CM | POA: Diagnosis not present

## 2015-11-21 DIAGNOSIS — M79604 Pain in right leg: Secondary | ICD-10-CM | POA: Diagnosis not present

## 2015-11-21 DIAGNOSIS — R278 Other lack of coordination: Secondary | ICD-10-CM | POA: Diagnosis not present

## 2015-11-21 DIAGNOSIS — M62561 Muscle wasting and atrophy, not elsewhere classified, right lower leg: Secondary | ICD-10-CM | POA: Diagnosis not present

## 2015-11-21 MED ORDER — FENTANYL 25 MCG/HR TD PT72: 25.0000 ug | MEDICATED_PATCH | TRANSDERMAL | 0 refills | Status: DC

## 2015-11-21 NOTE — Telephone Encounter (Signed)
Patient requested 

## 2015-11-24 ENCOUNTER — Other Ambulatory Visit: Payer: Self-pay | Admitting: Internal Medicine

## 2015-11-24 NOTE — Telephone Encounter (Signed)
[place] 

## 2015-11-25 ENCOUNTER — Telehealth: Payer: Self-pay | Admitting: Cardiology

## 2015-11-25 DIAGNOSIS — M79604 Pain in right leg: Secondary | ICD-10-CM | POA: Diagnosis not present

## 2015-11-25 DIAGNOSIS — M25551 Pain in right hip: Secondary | ICD-10-CM | POA: Diagnosis not present

## 2015-11-25 DIAGNOSIS — M629 Disorder of muscle, unspecified: Secondary | ICD-10-CM | POA: Diagnosis not present

## 2015-11-25 DIAGNOSIS — R278 Other lack of coordination: Secondary | ICD-10-CM | POA: Diagnosis not present

## 2015-11-25 DIAGNOSIS — R2689 Other abnormalities of gait and mobility: Secondary | ICD-10-CM | POA: Diagnosis not present

## 2015-11-25 DIAGNOSIS — M62561 Muscle wasting and atrophy, not elsewhere classified, right lower leg: Secondary | ICD-10-CM | POA: Diagnosis not present

## 2015-11-25 NOTE — Telephone Encounter (Signed)
Noted. No changes at this time.

## 2015-11-25 NOTE — Telephone Encounter (Signed)
New Message:    Nurse calling from [facility]. Pt heart rate is elevated.

## 2015-11-25 NOTE — Telephone Encounter (Signed)
I spoke with [name] at [facility]. She reports pt's heart rate was 47 yesterday. This AM heart rate was 110. Rechecked and was 100. BP 134/86.  Pt was feeling fine at that time. When therapist went to work with pt today heart rate was 116 prior to therapy. Heart rate was rechecked an hour later and it was 105.  Pt felt dizzy when therapist was there and was encouraged to use her walker.  Not complaining of dizziness at current time.  Will forward to Dr. Rayann Heman for review.

## 2015-11-26 NOTE — Telephone Encounter (Signed)
[name] advised no changes at this time per Dr Rayann Heman.

## 2015-12-01 ENCOUNTER — Encounter: Payer: Self-pay | Admitting: Internal Medicine

## 2015-12-01 ENCOUNTER — Ambulatory Visit (INDEPENDENT_AMBULATORY_CARE_PROVIDER_SITE_OTHER): Payer: Medicare Other | Admitting: Internal Medicine

## 2015-12-01 VITALS — BP 102/42 | HR 53 | Ht 60.0 in | Wt 114.2 lb

## 2015-12-01 DIAGNOSIS — I2510 Atherosclerotic heart disease of native coronary artery without angina pectoris: Secondary | ICD-10-CM

## 2015-12-01 DIAGNOSIS — I10 Essential (primary) hypertension: Secondary | ICD-10-CM

## 2015-12-01 DIAGNOSIS — I480 Paroxysmal atrial fibrillation: Secondary | ICD-10-CM | POA: Diagnosis not present

## 2015-12-01 DIAGNOSIS — R001 Bradycardia, unspecified: Secondary | ICD-10-CM | POA: Diagnosis not present

## 2015-12-01 NOTE — Progress Notes (Signed)
PCP:  Hollace Kinnier, DO Primary Cardiologist:  Dr Aundra Dubin  The patient presents today for electrophysiology followup.  She continues to have difficulty with recurrent symptomatic afib.  She has tachypalpitations with heart rates frequently greater than 100 bpm during afib.  She also has frequent sinus bradycardia on amiodarone.  She has had some fatigue with this.  She denies any other symptoms. Today, she denies symptoms of chest pain, shortness of breath, orthopnea, PND, lower extremity edema, dizziness, presyncope, syncope, or neurologic sequela.  The patient feels that she is tolerating medications without difficulties and is otherwise without complaint today.   Past Medical History:  Diagnosis Date  . Abnormal stress test    a. 11/2011 Ex MV: EF 80%, small, partially reversible anteroapical defect->mild ischemia vs attenuation-->med Rx.  [name] Kitchen Alopecia, unspecified   . Backache, unspecified   . Chronic diastolic CHF (congestive heart failure) ([place])    a. 11/2013 Echo: EF 60-65%, no rwma, mild TR, PASP 91mmHg.  . Closed fracture of lower end of radius with ulna   . Deafness    Left, s/p multiple surgeries  . Edema   . Essential hypertension   . Gouty arthropathy, unspecified   . Headache(784.0)   . Hypercalcemia   . Hyperpotassemia   . Hyposmolality and/or hyponatremia   . Hypothyroidism   . Insomnia, unspecified   . Macular degeneration    Left, s/p inj. tx  . Mitral valve disorders(424.0)   . Neck mass    right, w/u including MRI negative  . Other malaise and fatigue   . PAF (paroxysmal atrial fibrillation) (HCC)    a. on amio (02/2012 mild obstruction on PFT's) and xarelto.  . Polycythemia vera(238.4)   . Pure hypercholesterolemia   . Senile osteoporosis    Reclast in the past  . Tricuspid valve disorders, specified as nonrheumatic    Past Surgical History:  Procedure Laterality Date  . ABLATION  08/28/14   Back   . BREAST BIOPSY  06/18/1998   left  . BREAST BIOPSY      . FEMUR IM NAIL Right 10/08/2015   Procedure: INTRAMEDULLARY (IM) NAIL FEMORAL RIGHT;  Surgeon: Paralee Cancel, MD;  Location: [facility];  Service: Orthopedics;  Laterality: Right;  . TONSILLECTOMY    . TOTAL ABDOMINAL HYSTERECTOMY    . TYMPANOPLASTY Bilateral     Current Outpatient Prescriptions  Medication Sig Dispense Refill  . amiodarone (PACERONE) 100 MG tablet Take 1 tablet (100 mg total) by mouth daily. 30 tablet 3  . cholecalciferol (VITAMIN D) 1000 UNITS tablet Take 2,000 Units by mouth daily.     . fentaNYL (DURAGESIC - DOSED MCG/HR) 25 MCG/HR patch Place 1 patch (25 mcg total) onto the skin every 3 (three) days. 10 patch 0  . furosemide (LASIX) 40 MG tablet Take 2 tablets (80 mg) by mouth in the morning and take 1.5 tablets (60 mg) in the evening    . gabapentin (NEURONTIN) 100 MG capsule Take 100 mg by mouth at bedtime.     [name] Kitchen guaiFENesin (MUCINEX) 600 MG 12 hr tablet Take 600 mg by mouth 2 (two) times daily as needed for cough or to loosen phlegm.    . hydrALAZINE (APRESOLINE) 25 MG tablet Take 1 tablet (25 mg total) by mouth 3 (three) times daily. 270 tablet 1  . hydroxyurea (HYDREA) 500 MG capsule May take with food to minimize GI side effects. Take 500 mg daily from Mondays to Fridays and take 1000 mg on Saturdays and Sundays  40 capsule 9  . levothyroxine (SYNTHROID, LEVOTHROID) 112 MCG tablet Take 112 mcg by mouth daily before breakfast.    . Magnesium 250 MG TABS Take 250 mg by mouth daily.     . Melatonin 5 MG CAPS Take 5 mg by mouth at bedtime.     . Multiple Vitamins-Minerals (PRESERVISION AREDS 2) CAPS Take 1 capsule by mouth 2 (two) times daily.     . polycarbophil (FIBERCON) 625 MG tablet Take 2 tablets (1,250 mg total) by mouth 2 (two) times daily. 60 tablet 0  . potassium chloride (K-DUR,KLOR-CON) 10 MEQ tablet Take 1 tablet (10 mEq total) by mouth 2 (two) times daily. 60 tablet 0  . Rivaroxaban (XARELTO) 15 MG TABS tablet Take 15 mg by mouth daily with supper.    .  bisacodyl (DULCOLAX) 10 MG suppository Place 1 suppository (10 mg total) rectally daily as needed for moderate constipation. (Patient not taking: Reported on 12/01/2015) 12 suppository 0   No current facility-administered medications for this visit.     Allergies  Allergen Reactions  . Amlodipine Swelling and Other (See Comments)    Reaction:  Lower extremity swelling     Social History   Social History  . Marital status: Married    Spouse name: [name]  . Number of children: 2  . Years of education: 16   Occupational History  . Not on file.   Social History Main Topics  . Smoking status: Former Smoker    Types: Cigarettes    Quit date: 01/19/1984  . Smokeless tobacco: Never Used  . Alcohol use 0.6 oz/week    1 Glasses of wine per week     Comment: 2 per day/ 14 a week  . Drug use: No  . Sexual activity: Not Currently   Other Topics Concern  . Not on file   Social History Narrative   Patient is Married 1955. 2 kids. 63 grandkid-3 years old in 2016. College graduate [name] [place]).    Lives in apartment,  Independent Living  section at [place] since 02/2013.      Stay at home mother.       No Smoking history, Mod. alcohol use.   Patient has a living will, POA      Hobbies: CPU and painting             Family History  Problem Relation Age of Onset  . Hypertension Father   . Heart disease Father     patient does not know details.   . Ovarian cancer Mother 86  . Cancer Sister     colon  . Heart disease Sister   . Cancer Sister   . Cancer Sister     hodgkin disease  . Breast cancer Daughter   . Cancer Daughter     breast    ROS-  All systems are reviewed and are negative except as outlined in the HPI above  Physical Exam: Vitals:   12/01/15 1413  BP: (!) 102/42  Pulse: (!) 53  Weight: 114 lb 4 oz (51.8 kg)  Height: 5' (1.524 m)    GEN- The patient is well appearing, alert and oriented x 3 today.   Head- normocephalic,  atraumatic Eyes-  Sclera clear, conjunctiva pink Ears- hearing intact Oropharynx- clear Neck- supple, no JVP Lymph- no cervical lymphadenopathy Lungs- Clear to ausculation bilaterally, normal work of breathing Heart- Regular rate and rhythm, no murmurs, rubs or gallops, PMI not laterally displaced GI- soft, NT,  ND, + BS Extremities- no clubbing, cyanosis, or edema  ekg today reveals sinus rhythm 67 bpm, PR 258 msec, incomplete RBBB, nonspecific ST/T changes, LVH  Echo 08/05/15- EF 60%, mild MR, mild TR,  LA 71mm  Assessment and Plan:  1. Paroxysmal atrial fibrillation Recurrent symptomatic atrial fibrillation.  She has failed medical therapy with amiodarone.  She is appropriately anticoagulated.  Her chads2vasc score is at least 4.   CrCl 42.  She is on xarelto 15mg  daily appropriately.  Therapeutic strategies for afib including pacemaker implantation, medicine and ablation were discussed in detail with the patient today. Risk, benefits, and alternatives to EP study and radiofrequency ablation for afib were also discussed in detail today. These risks include but are not limited to stroke, bleeding, vascular damage, tamponade, perforation, damage to the esophagus, lungs, and other structures, pulmonary vein stenosis, worsening renal function, and death. The patient understands these risk and wishes to proceed.  We will therefore proceed with catheter ablation at the next available time.  Will plan cardiac CT within 1 week of ablation to evaluate for LAA thrombus.  2. Sick sinus syndrome Presently, her heart rates are stable. Hopefully we will be able to discontinue amiodarone post ablation.  I am optimistic that her heart rates may improve and we could thus avoid PPM.  3. htn Stable No change required today  Above discussed with patient's daughter by phone today who is in agreement with the plan.   Thompson Grayer MD, [facility] 12/01/2015

## 2015-12-01 NOTE — Patient Instructions (Signed)
Medication Instructions:  Your physician recommends that you continue on your current medications as directed. Please refer to the Current Medication list given to you today.   Labwork: Your physician recommends that you return for lab work on 12/22/15 at 2pm--you do not have to fast   Testing/Procedures: Your physician has requested that you have cardiac CT. Cardiac computed tomography (CT) is a painless test that uses an x-ray machine to take clear, detailed pictures of your heart. For further information please visit [facility]. Please follow instruction sheet as given.---week of 12/29/15 ---office will call you with instructions after scheduled    Your physician has recommended that you have an ablation. Catheter ablation is a medical procedure used to treat some cardiac arrhythmias (irregular heartbeats). During catheter ablation, a long, thin, flexible tube is put into a blood vessel in your groin (upper thigh), or neck. This tube is called an ablation catheter. It is then guided to your heart through the blood vessel. Radio frequency waves destroy small areas of heart tissue where abnormal heartbeats may cause an arrhythmia to start. Please see the instruction sheet given to you today.---01/08/16  Please arrive at [facility] at 5:30am Do not eat or drink after midnight the night prior to the procedure Do not take any medications the morning of the procedure Plan for one night stay and will need someone to drive you home from the hospital.      Follow-Up: Your physician recommends that you schedule a follow-up appointment in: 4 weeks from 01/08/16 with Roderic Palau, NP in [facility] and 3 months from 01/08/16 with Dr Rayann Heman

## 2015-12-02 ENCOUNTER — Encounter: Payer: Self-pay | Admitting: Internal Medicine

## 2015-12-04 DIAGNOSIS — Z23 Encounter for immunization: Secondary | ICD-10-CM | POA: Diagnosis not present

## 2015-12-05 ENCOUNTER — Other Ambulatory Visit: Payer: Self-pay | Admitting: Hematology and Oncology

## 2015-12-05 DIAGNOSIS — D45 Polycythemia vera: Secondary | ICD-10-CM

## 2015-12-08 ENCOUNTER — Telehealth: Payer: Self-pay | Admitting: Hematology and Oncology

## 2015-12-08 ENCOUNTER — Other Ambulatory Visit (HOSPITAL_BASED_OUTPATIENT_CLINIC_OR_DEPARTMENT_OTHER): Payer: Medicare Other

## 2015-12-08 ENCOUNTER — Encounter: Payer: Self-pay | Admitting: Hematology and Oncology

## 2015-12-08 ENCOUNTER — Ambulatory Visit (HOSPITAL_BASED_OUTPATIENT_CLINIC_OR_DEPARTMENT_OTHER): Payer: Medicare Other | Admitting: Hematology and Oncology

## 2015-12-08 ENCOUNTER — Telehealth: Payer: Self-pay | Admitting: Hematology

## 2015-12-08 VITALS — BP 130/51 | HR 51 | Temp 98.1°F | Resp 18 | Ht 60.0 in | Wt 114.7 lb

## 2015-12-08 DIAGNOSIS — D45 Polycythemia vera: Secondary | ICD-10-CM

## 2015-12-08 DIAGNOSIS — I480 Paroxysmal atrial fibrillation: Secondary | ICD-10-CM

## 2015-12-08 LAB — CBC WITH DIFFERENTIAL/PLATELET
BASO%: 1.1 % (ref 0.0–2.0)
BASOS ABS: 0.1 10*3/uL (ref 0.0–0.1)
EOS ABS: 0.2 10*3/uL (ref 0.0–0.5)
EOS%: 2.2 % (ref 0.0–7.0)
HEMATOCRIT: 43.2 % (ref 34.8–46.6)
HEMOGLOBIN: 14.1 g/dL (ref 11.6–15.9)
LYMPH#: 1.4 10*3/uL (ref 0.9–3.3)
LYMPH%: 15.2 % (ref 14.0–49.7)
MCH: 37.5 pg — AB (ref 25.1–34.0)
MCHC: 32.7 g/dL (ref 31.5–36.0)
MCV: 114.7 fL — AB (ref 79.5–101.0)
MONO#: 0.7 10*3/uL (ref 0.1–0.9)
MONO%: 7.5 % (ref 0.0–14.0)
NEUT#: 6.7 10*3/uL — ABNORMAL HIGH (ref 1.5–6.5)
NEUT%: 74 % (ref 38.4–76.8)
PLATELETS: 338 10*3/uL (ref 145–400)
RBC: 3.77 10*6/uL (ref 3.70–5.45)
RDW: 17.9 % — AB (ref 11.2–14.5)
WBC: 9.1 10*3/uL (ref 3.9–10.3)

## 2015-12-08 NOTE — Progress Notes (Signed)
[facility] OFFICE PROGRESS NOTE  Patient Care Team: Gayland Curry, DO as PCP - General (Geriatric Medicine) [facility] Hurman Horn, MD as Consulting Physician (Ophthalmology) Larey Dresser, MD as Consulting Physician (Cardiology) Heath Lark, MD as Consulting Physician (Hematology and Oncology) Clent Jacks, MD as Consulting Physician (Ophthalmology) Jerrell Belfast, MD as Consulting Physician (Otolaryngology)  SUMMARY OF ONCOLOGIC HISTORY:   Polycythemia vera ([place])   08/05/2011 Pathology Results    Peripheral blood JAK2 mutation was positive with low serum erythropoietin level. Bone marrow aspirate and biopsy was not performed.      08/12/2011 -  Chemotherapy    She is started on hydroxyurea with periodic phlebotomy.       INTERVAL HISTORY: Please see below for problem oriented charting. She returns for follow-up. She feels well. She is compliant taking hydroxyurea as instructed, 500 mg daily and 1000 mg on the weekend. She has planned procedure next month for her cardiac condition. She remain on chronic anticoagulation therapy. The patient denies any recent signs or symptoms of bleeding such as spontaneous epistaxis, hematuria or hematochezia.  REVIEW OF SYSTEMS:   Constitutional: Denies fevers, chills or abnormal weight loss Eyes: Denies blurriness of vision Ears, nose, mouth, throat, and face: Denies mucositis or sore throat Respiratory: Denies cough, dyspnea or wheezes Cardiovascular: Denies palpitation, chest discomfort or lower extremity swelling Gastrointestinal:  Denies nausea, heartburn or change in bowel habits Skin: Denies abnormal skin rashes Lymphatics: Denies new lymphadenopathy or easy bruising Neurological:Denies numbness, tingling or new weaknesses Behavioral/Psych: Mood is stable, no new changes  All other systems were reviewed with the patient and are negative.  I have reviewed the past medical history, past  surgical history, social history and family history with the patient and they are unchanged from previous note.  ALLERGIES:  is allergic to amlodipine.  MEDICATIONS:  Current Outpatient Prescriptions  Medication Sig Dispense Refill  . amiodarone (PACERONE) 100 MG tablet Take 1 tablet (100 mg total) by mouth daily. 30 tablet 3  . bisacodyl (DULCOLAX) 10 MG suppository Place 1 suppository (10 mg total) rectally daily as needed for moderate constipation. (Patient not taking: Reported on 12/01/2015) 12 suppository 0  . cholecalciferol (VITAMIN D) 1000 UNITS tablet Take 2,000 Units by mouth daily.     . fentaNYL (DURAGESIC - DOSED MCG/HR) 25 MCG/HR patch Place 1 patch (25 mcg total) onto the skin every 3 (three) days. 10 patch 0  . furosemide (LASIX) 40 MG tablet Take 2 tablets (80 mg) by mouth in the morning and take 1.5 tablets (60 mg) in the evening    . gabapentin (NEURONTIN) 100 MG capsule Take 100 mg by mouth at bedtime.     [name] Kitchen guaiFENesin (MUCINEX) 600 MG 12 hr tablet Take 600 mg by mouth 2 (two) times daily as needed for cough or to loosen phlegm.    . hydrALAZINE (APRESOLINE) 25 MG tablet Take 1 tablet (25 mg total) by mouth 3 (three) times daily. 270 tablet 1  . hydroxyurea (HYDREA) 500 MG capsule May take with food to minimize GI side effects. Take 500 mg daily from Mondays to Fridays and take 1000 mg on Saturdays and Sundays 40 capsule 9  . levothyroxine (SYNTHROID, LEVOTHROID) 112 MCG tablet Take 112 mcg by mouth daily before breakfast.    . Magnesium 250 MG TABS Take 250 mg by mouth daily.     . Melatonin 5 MG CAPS Take 5 mg by mouth at bedtime.     [name] Kitchen  Multiple Vitamins-Minerals (PRESERVISION AREDS 2) CAPS Take 1 capsule by mouth 2 (two) times daily.     . polycarbophil (FIBERCON) 625 MG tablet Take 2 tablets (1,250 mg total) by mouth 2 (two) times daily. 60 tablet 0  . potassium chloride (K-DUR,KLOR-CON) 10 MEQ tablet Take 1 tablet (10 mEq total) by mouth 2 (two) times daily. 60 tablet 0   . Rivaroxaban (XARELTO) 15 MG TABS tablet Take 15 mg by mouth daily with supper.     No current facility-administered medications for this visit.     PHYSICAL EXAMINATION: ECOG PERFORMANCE STATUS: 0 - Asymptomatic  Vitals:   12/08/15 1459  BP: (!) 130/51  Pulse: (!) 51  Resp: 18  Temp: 98.1 F (36.7 C)   Filed Weights   12/08/15 1459  Weight: 114 lb 11.2 oz (52 kg)    GENERAL:alert, no distress and comfortable SKIN: skin color, texture, turgor are normal, no rashes or significant lesions EYES: normal, Conjunctiva are pink and non-injected, sclera clear Musculoskeletal:no cyanosis of digits and no clubbing  NEURO: alert & oriented x 3 with fluent speech, no focal motor/sensory deficits  LABORATORY DATA:  I have reviewed the data as listed    Component Value Date/Time   NA 135 10/10/2015 0410   NA 139 06/20/2015 0903   K 4.4 10/10/2015 0410   CL 103 10/10/2015 0410   CO2 26 10/10/2015 0410   GLUCOSE 108 (H) 10/10/2015 0410   BUN 27 (H) 10/10/2015 0410   BUN 24 06/20/2015 0903   CREATININE 0.83 10/10/2015 0410   CREATININE 1.24 (H) 09/01/2015 1352   CALCIUM 9.4 10/10/2015 0410   PROT 6.5 10/06/2015 0408   PROT 6.2 06/20/2015 0903   ALBUMIN 4.3 10/06/2015 0408   ALBUMIN 4.6 06/20/2015 0903   AST 18 10/06/2015 0408   ALT 18 10/06/2015 0408   ALKPHOS 49 10/06/2015 0408   BILITOT 0.6 10/06/2015 0408   BILITOT 0.7 06/20/2015 0903   GFRNONAA >60 10/10/2015 0410   GFRAA >60 10/10/2015 0410    No results found for: SPEP, UPEP  Lab Results  Component Value Date   WBC 9.1 12/08/2015   NEUTROABS 6.7 (H) 12/08/2015   HGB 14.1 12/08/2015   HCT 43.2 12/08/2015   MCV 114.7 (H) 12/08/2015   PLT 338 12/08/2015      Chemistry      Component Value Date/Time   NA 135 10/10/2015 0410   NA 139 06/20/2015 0903   K 4.4 10/10/2015 0410   CL 103 10/10/2015 0410   CO2 26 10/10/2015 0410   BUN 27 (H) 10/10/2015 0410   BUN 24 06/20/2015 0903   CREATININE 0.83 10/10/2015  0410   CREATININE 1.24 (H) 09/01/2015 1352   GLU 80 10/29/2014      Component Value Date/Time   CALCIUM 9.4 10/10/2015 0410   ALKPHOS 49 10/06/2015 0408   AST 18 10/06/2015 0408   ALT 18 10/06/2015 0408   BILITOT 0.6 10/06/2015 0408   BILITOT 0.7 06/20/2015 0903       ASSESSMENT & PLAN:  Polycythemia vera She tolerated treatment well. She tolerated hydroxyurea dose at 1000 mg on Saturdays and Sundays and 500 mg daily from Monday to Friday She will continue Xarelto to prevent risk of thrombosis I will see her back in 3 months from now with repeat blood work.  Paroxysmal atrial fibrillation The patient is currently on amiodarone. She follows closely with cardiologist. She has planned procedure for ablation therapy next month I would defer to her cardiologist  for medication adjustment She is on chronic anticoagulation therapy to reduce risk of stroke There is no contraindication for her to proceed from the hematology standpoint   Orders Placed This Encounter  Procedures  . CBC with Differential/Platelet    Standing Status:   Future    Standing Expiration Date:   01/11/2017   All questions were answered. The patient knows to call the clinic with any problems, questions or concerns. No barriers to learning was detected. I spent 10 minutes counseling the patient face to face. The total time spent in the appointment was 15 minutes and more than 50% was on counseling and review of test results     Heath Lark, MD 12/08/2015 3:09 PM

## 2015-12-08 NOTE — Assessment & Plan Note (Signed)
The patient is currently on amiodarone. She follows closely with cardiologist. She has planned procedure for ablation therapy next month I would defer to her cardiologist for medication adjustment She is on chronic anticoagulation therapy to reduce risk of stroke There is no contraindication for her to proceed from the hematology standpoint

## 2015-12-08 NOTE — Assessment & Plan Note (Signed)
She tolerated treatment well. She tolerated hydroxyurea dose at 1000 mg on Saturdays and Sundays and 500 mg daily from Monday to Friday She will continue Xarelto to prevent risk of thrombosis I will see her back in 3 months from now with repeat blood work.

## 2015-12-08 NOTE — Telephone Encounter (Signed)
Appointments scheduled per 10/30 LOS. Patient given AVS report and calendars of next scheduled appointments.  °

## 2015-12-12 ENCOUNTER — Other Ambulatory Visit: Payer: Self-pay | Admitting: Hematology and Oncology

## 2015-12-15 ENCOUNTER — Telehealth: Payer: Self-pay | Admitting: Internal Medicine

## 2015-12-15 NOTE — Telephone Encounter (Signed)
Returned call to patient and let her know that she needs to be at the hospital at 5:30am and will not need to be monitored after her procedure when discharged

## 2015-12-15 NOTE — Telephone Encounter (Signed)
New Message:   Please call,question about ablation she is having on 01-08-16.

## 2015-12-17 ENCOUNTER — Non-Acute Institutional Stay: Payer: Medicare Other | Admitting: Internal Medicine

## 2015-12-17 ENCOUNTER — Encounter: Payer: Self-pay | Admitting: Internal Medicine

## 2015-12-17 VITALS — BP 128/60 | HR 90 | Temp 98.5°F | Wt 115.0 lb

## 2015-12-17 DIAGNOSIS — D45 Polycythemia vera: Secondary | ICD-10-CM

## 2015-12-17 DIAGNOSIS — G8929 Other chronic pain: Secondary | ICD-10-CM | POA: Diagnosis not present

## 2015-12-17 DIAGNOSIS — M810 Age-related osteoporosis without current pathological fracture: Secondary | ICD-10-CM | POA: Diagnosis not present

## 2015-12-17 DIAGNOSIS — M545 Low back pain, unspecified: Secondary | ICD-10-CM

## 2015-12-17 DIAGNOSIS — I495 Sick sinus syndrome: Secondary | ICD-10-CM

## 2015-12-17 DIAGNOSIS — S72301S Unspecified fracture of shaft of right femur, sequela: Secondary | ICD-10-CM | POA: Diagnosis not present

## 2015-12-17 DIAGNOSIS — I480 Paroxysmal atrial fibrillation: Secondary | ICD-10-CM | POA: Diagnosis not present

## 2015-12-17 DIAGNOSIS — I2510 Atherosclerotic heart disease of native coronary artery without angina pectoris: Secondary | ICD-10-CM | POA: Diagnosis not present

## 2015-12-17 DIAGNOSIS — E039 Hypothyroidism, unspecified: Secondary | ICD-10-CM | POA: Diagnosis not present

## 2015-12-17 NOTE — Progress Notes (Signed)
Location:  Occupational psychologist of Service:  Clinic (12)  Provider: Naiomy Watters L. Mariea Clonts, D.O., C.M.D.  Code Status: DNR  Goals of Care:  Advanced Directives 12/08/2015  Does patient have an advance directive? Yes  Type of Paramedic of [place];Living will  Does patient want to make changes to advanced directive? No - Patient declined  Copy of advanced directive(s) in chart? No - copy requested  Would patient like information on creating an advanced directive? -  Pre-existing out of facility DNR order (yellow form or pink MOST form) -     Chief Complaint  Patient presents with  . Medical Management of Chronic Issues    6 week follow-up    HPI: Patient is a 82 y.o. female seen today for medical management of chronic diseases.    Walking better with her hip now.  Says she is surprised how comfortable she is.  She insists upon calling it her leg not her hip.    Has plans for ablation on 11/30 for her afib--hopefully this will prevent the significant bradycardia she's been having at times with this.  Today her pulse was actually on the higher side instead at 90.  Low back pain:  Good and bad days.  Still has tramadol and will run out soon.  Takes it at bedtime.  Continues with the fentanyl patches with significant benefit.    Past Medical History:  Diagnosis Date  . Abnormal stress test    a. 11/2011 Ex MV: EF 80%, small, partially reversible anteroapical defect->mild ischemia vs attenuation-->med Rx.  [name] Kitchen Alopecia, unspecified   . Backache, unspecified   . Chronic diastolic CHF (congestive heart failure) ([place])    a. 11/2013 Echo: EF 60-65%, no rwma, mild TR, PASP 62mmHg.  . Closed fracture of lower end of radius with ulna   . Deafness    Left, s/p multiple surgeries  . Edema   . Essential hypertension   . Gouty arthropathy, unspecified   . Headache(784.0)   . Hypercalcemia   . Hyperpotassemia   . Hyposmolality and/or hyponatremia    . Hypothyroidism   . Insomnia, unspecified   . Macular degeneration    Left, s/p inj. tx  . Mitral valve disorders(424.0)   . Neck mass    right, w/u including MRI negative  . Other malaise and fatigue   . PAF (paroxysmal atrial fibrillation) ([facility])    a. on amio (02/2012 mild obstruction on PFT's) and xarelto.  . Polycythemia vera(238.4)   . Pure hypercholesterolemia   . Senile osteoporosis    Reclast in the past  . Tricuspid valve disorders, specified as nonrheumatic     Past Surgical History:  Procedure Laterality Date  . ABLATION  08/28/14   Back   . BREAST BIOPSY  06/18/1998   left  . BREAST BIOPSY    . FEMUR IM NAIL Right 10/08/2015   Procedure: INTRAMEDULLARY (IM) NAIL FEMORAL RIGHT;  Surgeon: Paralee Cancel, MD;  Location: [facility];  Service: Orthopedics;  Laterality: Right;  . TONSILLECTOMY    . TOTAL ABDOMINAL HYSTERECTOMY    . TYMPANOPLASTY Bilateral     Allergies  Allergen Reactions  . Amlodipine Swelling and Other (See Comments)    Reaction:  Lower extremity swelling       Medication List       Accurate as of 12/17/15  3:42 PM. Always use your most recent med list.          amiodarone 100  MG tablet Commonly known as:  PACERONE Take 1 tablet (100 mg total) by mouth daily.   cholecalciferol 1000 units tablet Commonly known as:  VITAMIN D Take 2,000 Units by mouth daily.   fentaNYL 25 MCG/HR patch Commonly known as:  DURAGESIC - dosed mcg/hr Place 1 patch (25 mcg total) onto the skin every 3 (three) days.   furosemide 40 MG tablet Commonly known as:  LASIX Take 2 tablets (80 mg) by mouth in the morning and take 1.5 tablets (60 mg) in the evening   gabapentin 100 MG capsule Commonly known as:  NEURONTIN Take 100 mg by mouth at bedtime.   guaiFENesin 600 MG 12 hr tablet Commonly known as:  MUCINEX Take 600 mg by mouth 2 (two) times daily as needed for cough or to loosen phlegm.   hydrALAZINE 25 MG tablet Commonly known as:  APRESOLINE Take 1  tablet (25 mg total) by mouth 3 (three) times daily.   hydroxyurea 500 MG capsule Commonly known as:  HYDREA TAKE 2 CAPSULES ON MONDAYS AND TAKE 1 CAPSULE ALL OTHER DAYS OF THE WEEK.   levothyroxine 112 MCG tablet Commonly known as:  SYNTHROID, LEVOTHROID Take 112 mcg by mouth daily before breakfast.   Magnesium 250 MG Tabs Take 250 mg by mouth daily.   Melatonin 5 MG Caps Take 5 mg by mouth at bedtime.   polycarbophil 625 MG tablet Commonly known as:  FIBERCON Take 2 tablets (1,250 mg total) by mouth 2 (two) times daily.   potassium chloride 10 MEQ tablet Commonly known as:  K-DUR,KLOR-CON Take 1 tablet (10 mEq total) by mouth 2 (two) times daily.   PRESERVISION AREDS 2 Caps Take 1 capsule by mouth 2 (two) times daily.   Rivaroxaban 15 MG Tabs tablet Commonly known as:  XARELTO Take 15 mg by mouth daily with supper.      Review of Systems:  Review of Systems  Constitutional: Negative for chills, fever and malaise/fatigue.  HENT: Positive for hearing loss. Negative for congestion.   Eyes: Negative for blurred vision.  Respiratory: Negative for shortness of breath.   Cardiovascular: Negative for chest pain, palpitations and leg swelling.  Gastrointestinal: Negative for abdominal pain.  Genitourinary: Negative for dysuria.  Musculoskeletal: Positive for back pain. Negative for falls and joint pain.  Skin: Negative for itching and rash.  Neurological: Negative for dizziness, loss of consciousness and weakness.  Endo/Heme/Allergies: Bruises/bleeds easily.  Psychiatric/Behavioral: Negative for depression and memory loss. The patient does not have insomnia.     Health Maintenance  Topic Date Due  . TETANUS/TDAP  08/03/1949  . INFLUENZA VACCINE  09/09/2015  . DEXA SCAN  Completed  . ZOSTAVAX  Completed  . PNA vac Low Risk Adult  Completed    Physical Exam: Vitals:   12/17/15 1527  BP: 128/60  Pulse: 90  Temp: 98.5 F (36.9 C)  TempSrc: Oral  SpO2: 95%    Weight: 115 lb (52.2 kg)   Body mass index is 22.46 kg/m. Physical Exam  Constitutional: She is oriented to person, place, and time. She appears well-developed and well-nourished. No distress.  Cardiovascular:  irreg irreg  Pulmonary/Chest: Effort normal and breath sounds normal. No respiratory distress.  Abdominal: Soft. Bowel sounds are normal. She exhibits no distension. There is no tenderness.  Musculoskeletal:  Walks with rollator walker; gait much improved from last visit  Neurological: She is alert and oriented to person, place, and time.  Skin: Skin is warm and dry.  Psychiatric: She has a  normal mood and affect.    Labs reviewed: Basic Metabolic Panel:  Recent Labs  07/18/15 1506  10/08/15 0412 10/09/15 0416 10/10/15 0410  NA  --   < > 141 139 135  K  --   < > 3.4* 4.2 4.4  CL  --   < > 101 103 103  CO2  --   < > 33* 29 26  GLUCOSE  --   < > 86 134* 108*  BUN  --   < > 18 20 27*  CREATININE  --   < > 0.80 0.80 0.83  CALCIUM  --   < > 10.0 9.1 9.4  TSH 2.21  --   --   --   --   < > = values in this interval not displayed. Liver Function Tests:  Recent Labs  06/20/15 0903 10/06/15 0408  AST 17 18  ALT 11 18  ALKPHOS 72 49  BILITOT 0.7 0.6  PROT 6.2 6.5  ALBUMIN 4.6 4.3   No results for input(s): LIPASE, AMYLASE in the last 8760 hours. No results for input(s): AMMONIA in the last 8760 hours. CBC:  Recent Labs  10/10/15 0410 11/03/15 1449 12/08/15 1447  WBC 13.0* 9.7 9.1  NEUTROABS 10.3* 7.3* 6.7*  HGB 12.4 13.9 14.1  HCT 37.8 43.5 43.2  MCV 116.3* 114.6* 114.7*  PLT 441* 620* 338   Lipid Panel:  Recent Labs  07/29/15 1200  CHOL 162  HDL 101*  LDLCALC 42  TRIG 100   Assessment/Plan 1. Paroxysmal atrial fibrillation (HCC) -has had difficulty with rate control--tachy/brady syndrome--on amiodarone 100mg  daily but off beta and ccb due to bradycardia -continues on xarelto for anticoagulation -now has plans for ablation 01/08/16  2.  Sick sinus syndrome ([facility]) -see#1  3. Senile osteoporosis -will resume prolia injections as it seems this was a typical osteoporotic fracture and not the effect of her bisphosphonate therapy (she was on it for too long)  4. Closed fracture of shaft of right femur, unspecified fracture morphology, sequela -seems this has healed and she is doing well  5. Polycythemia vera ([facility]) -cont increased dose of hydrea as per Dr. Alvy Bimler and monitor cbc  6. Chronic bilateral low back pain without sciatica -ongoing, continues to require fentanyl for chronic pain and tramadol just at night for this--has periods of worsened and improved pain  7. Hypothyroidism, unspecified type -cont levothyroxine 167mcg daily and monitor Lab Results  Component Value Date   TSH 2.21 07/18/2015   Labs/tests ordered:  No new Next appt:  12/31/2015 for prolia; 3 mos with me  Elad Macphail L. Mireya Meditz, D.O. [facility] [address], [place] [address] Cell Phone (Mon-Fri 8am-5pm):  [phone] On Call:  [phone] & follow prompts after 5pm & weekends Office Phone:  [phone] Office Fax:  [phone]

## 2015-12-22 ENCOUNTER — Other Ambulatory Visit: Payer: Medicare Other | Admitting: *Deleted

## 2015-12-22 ENCOUNTER — Other Ambulatory Visit: Payer: Self-pay | Admitting: Internal Medicine

## 2015-12-22 DIAGNOSIS — I480 Paroxysmal atrial fibrillation: Secondary | ICD-10-CM

## 2015-12-22 MED ORDER — FENTANYL 25 MCG/HR TD PT72: 25.0000 ug | MEDICATED_PATCH | TRANSDERMAL | 0 refills | Status: DC

## 2015-12-23 LAB — BASIC METABOLIC PANEL
BUN: 22 mg/dL (ref 7–25)
CHLORIDE: 97 mmol/L — AB (ref 98–110)
CO2: 31 mmol/L (ref 20–31)
Calcium: 10.6 mg/dL — ABNORMAL HIGH (ref 8.6–10.4)
Creat: 0.81 mg/dL (ref 0.60–0.88)
GLUCOSE: 82 mg/dL (ref 65–99)
POTASSIUM: 4.2 mmol/L (ref 3.5–5.3)
SODIUM: 138 mmol/L (ref 135–146)

## 2015-12-23 LAB — CBC WITH DIFFERENTIAL/PLATELET
Basophils Absolute: 65 cells/uL (ref 0–200)
Basophils Relative: 1 %
EOS ABS: 65 {cells}/uL (ref 15–500)
Eosinophils Relative: 1 %
HEMATOCRIT: 40.9 % (ref 35.0–45.0)
HEMOGLOBIN: 13.3 g/dL (ref 11.7–15.5)
LYMPHS ABS: 1170 {cells}/uL (ref 850–3900)
Lymphocytes Relative: 18 %
MCH: 36.9 pg — ABNORMAL HIGH (ref 27.0–33.0)
MCHC: 32.5 g/dL (ref 32.0–36.0)
MCV: 113.6 fL — AB (ref 80.0–100.0)
MONO ABS: 650 {cells}/uL (ref 200–950)
MPV: 10.8 fL (ref 7.5–12.5)
Monocytes Relative: 10 %
NEUTROS PCT: 70 %
Neutro Abs: 4550 cells/uL (ref 1500–7800)
Platelets: 388 10*3/uL (ref 140–400)
RBC: 3.6 MIL/uL — AB (ref 3.80–5.10)
RDW: 16.5 % — ABNORMAL HIGH (ref 11.0–15.0)
WBC: 6.5 10*3/uL (ref 3.8–10.8)

## 2015-12-30 ENCOUNTER — Encounter: Payer: Self-pay | Admitting: Internal Medicine

## 2015-12-30 DIAGNOSIS — H353221 Exudative age-related macular degeneration, left eye, with active choroidal neovascularization: Secondary | ICD-10-CM | POA: Diagnosis not present

## 2015-12-30 NOTE — Progress Notes (Signed)
Patient ID: Sarah Mays, female   DOB: [birth year], 82 y.o.   MRN: [id number] A user error has taken place. Opened in error

## 2015-12-31 ENCOUNTER — Ambulatory Visit: Payer: Medicare Other | Admitting: *Deleted

## 2015-12-31 DIAGNOSIS — M810 Age-related osteoporosis without current pathological fracture: Secondary | ICD-10-CM

## 2015-12-31 MED ORDER — DENOSUMAB 60 MG/ML ~~LOC~~ SOLN
60.0000 mg | Freq: Once | SUBCUTANEOUS | Status: AC
  Administered 2015-12-31: 60 mg via SUBCUTANEOUS

## 2016-01-05 ENCOUNTER — Encounter (HOSPITAL_COMMUNITY): Payer: Self-pay

## 2016-01-05 ENCOUNTER — Ambulatory Visit (HOSPITAL_COMMUNITY)
Admission: RE | Admit: 2016-01-05 | Discharge: 2016-01-05 | Disposition: A | Discharge status: Home / Self Care | Payer: Medicare Other | Source: Ambulatory Visit | Attending: Internal Medicine | Admitting: Internal Medicine

## 2016-01-05 DIAGNOSIS — R918 Other nonspecific abnormal finding of lung field: Secondary | ICD-10-CM | POA: Insufficient documentation

## 2016-01-05 DIAGNOSIS — D71 Functional disorders of polymorphonuclear neutrophils: Secondary | ICD-10-CM | POA: Insufficient documentation

## 2016-01-05 DIAGNOSIS — M4856XA Collapsed vertebra, not elsewhere classified, lumbar region, initial encounter for fracture: Secondary | ICD-10-CM | POA: Diagnosis not present

## 2016-01-05 DIAGNOSIS — I480 Paroxysmal atrial fibrillation: Secondary | ICD-10-CM

## 2016-01-05 DIAGNOSIS — I4891 Unspecified atrial fibrillation: Secondary | ICD-10-CM | POA: Diagnosis not present

## 2016-01-05 MED ORDER — IOPAMIDOL (ISOVUE-370) INJECTION 76%
INTRAVENOUS | Status: AC
Start: 2016-01-05 — End: 2016-01-05
  Administered 2016-01-05: 100 mL
  Filled 2016-01-05: qty 100

## 2016-01-05 NOTE — Progress Notes (Signed)
CT heart complete, VS stable, IV d/c. D/c home with family. No meds given for test

## 2016-01-06 ENCOUNTER — Encounter: Payer: Self-pay | Admitting: Internal Medicine

## 2016-01-06 DIAGNOSIS — R5383 Other fatigue: Secondary | ICD-10-CM | POA: Diagnosis not present

## 2016-01-06 DIAGNOSIS — Z79899 Other long term (current) drug therapy: Secondary | ICD-10-CM | POA: Diagnosis not present

## 2016-01-06 DIAGNOSIS — M810 Age-related osteoporosis without current pathological fracture: Secondary | ICD-10-CM | POA: Diagnosis not present

## 2016-01-08 ENCOUNTER — Ambulatory Visit (HOSPITAL_COMMUNITY): Payer: Medicare Other | Admitting: Anesthesiology

## 2016-01-08 ENCOUNTER — Encounter (HOSPITAL_COMMUNITY)
Admission: RE | Disposition: A | Discharge status: Home / Self Care | Payer: Self-pay | Source: Ambulatory Visit | Attending: Internal Medicine

## 2016-01-08 ENCOUNTER — Ambulatory Visit (HOSPITAL_COMMUNITY)
Admission: RE | Admit: 2016-01-08 | Discharge: 2016-01-09 | Disposition: A | Discharge status: Home / Self Care | Payer: Medicare Other | Source: Ambulatory Visit | Attending: Internal Medicine | Admitting: Internal Medicine

## 2016-01-08 ENCOUNTER — Ambulatory Visit (HOSPITAL_COMMUNITY): Payer: Medicare Other

## 2016-01-08 DIAGNOSIS — E7800 Pure hypercholesterolemia, unspecified: Secondary | ICD-10-CM | POA: Insufficient documentation

## 2016-01-08 DIAGNOSIS — I110 Hypertensive heart disease with heart failure: Secondary | ICD-10-CM | POA: Diagnosis not present

## 2016-01-08 DIAGNOSIS — D45 Polycythemia vera: Secondary | ICD-10-CM | POA: Diagnosis not present

## 2016-01-08 DIAGNOSIS — Z7901 Long term (current) use of anticoagulants: Secondary | ICD-10-CM | POA: Insufficient documentation

## 2016-01-08 DIAGNOSIS — Z87891 Personal history of nicotine dependence: Secondary | ICD-10-CM | POA: Diagnosis not present

## 2016-01-08 DIAGNOSIS — J811 Chronic pulmonary edema: Secondary | ICD-10-CM | POA: Diagnosis not present

## 2016-01-08 DIAGNOSIS — R0600 Dyspnea, unspecified: Secondary | ICD-10-CM

## 2016-01-08 DIAGNOSIS — E039 Hypothyroidism, unspecified: Secondary | ICD-10-CM | POA: Insufficient documentation

## 2016-01-08 DIAGNOSIS — I495 Sick sinus syndrome: Secondary | ICD-10-CM | POA: Insufficient documentation

## 2016-01-08 DIAGNOSIS — I509 Heart failure, unspecified: Secondary | ICD-10-CM | POA: Insufficient documentation

## 2016-01-08 DIAGNOSIS — I5032 Chronic diastolic (congestive) heart failure: Secondary | ICD-10-CM | POA: Diagnosis not present

## 2016-01-08 DIAGNOSIS — I480 Paroxysmal atrial fibrillation: Secondary | ICD-10-CM | POA: Diagnosis not present

## 2016-01-08 DIAGNOSIS — I4891 Unspecified atrial fibrillation: Secondary | ICD-10-CM | POA: Diagnosis present

## 2016-01-08 HISTORY — PX: ELECTROPHYSIOLOGIC STUDY: SHX172A

## 2016-01-08 LAB — POCT ACTIVATED CLOTTING TIME
ACTIVATED CLOTTING TIME: 202 s
ACTIVATED CLOTTING TIME: 224 s
Activated Clotting Time: 274 seconds
Activated Clotting Time: 180 seconds

## 2016-01-08 LAB — BASIC METABOLIC PANEL
Anion gap: 8 (ref 5–15)
BUN: 21 mg/dL — ABNORMAL HIGH (ref 6–20)
CHLORIDE: 99 mmol/L — AB (ref 101–111)
CO2: 29 mmol/L (ref 22–32)
Calcium: 10.8 mg/dL — ABNORMAL HIGH (ref 8.9–10.3)
Creatinine, Ser: 0.76 mg/dL (ref 0.44–1.00)
GFR calc non Af Amer: >60 mL/min (ref >60)
Glucose, Bld: 87 mg/dL (ref 65–99)
POTASSIUM: 4 mmol/L (ref 3.5–5.1)
SODIUM: 136 mmol/L (ref 135–145)

## 2016-01-08 LAB — CBC
HCT: 39.2 % (ref 36.0–46.0)
HEMOGLOBIN: 12.8 g/dL (ref 12.0–15.0)
MCH: 37 pg — ABNORMAL HIGH (ref 26.0–34.0)
MCHC: 32.7 g/dL (ref 30.0–36.0)
MCV: 113.3 fL — ABNORMAL HIGH (ref 78.0–100.0)
Platelets: 340 10*3/uL (ref 150–400)
RBC: 3.46 MIL/uL — AB (ref 3.87–5.11)
RDW: 16.3 % — ABNORMAL HIGH (ref 11.5–15.5)
WBC: 6.1 10*3/uL (ref 4.0–10.5)

## 2016-01-08 SURGERY — ATRIAL FIBRILLATION ABLATION
Anesthesia: General

## 2016-01-08 SURGERY — ABLATION, ARRHYTHMOGENIC FOCUS, FOR ATRIAL FLUTTER
Anesthesia: Choice

## 2016-01-08 MED ORDER — HEPARIN SODIUM (PORCINE) 1000 UNIT/ML IJ SOLN
INTRAMUSCULAR | Status: DC | PRN
  Administered 2016-01-08: 4000 [IU] via INTRAVENOUS
  Administered 2016-01-08: 5000 [IU] via INTRAVENOUS

## 2016-01-08 MED ORDER — BUPIVACAINE HCL (PF) 0.25 % IJ SOLN
INTRAMUSCULAR | Status: AC
  Filled 2016-01-08: qty 30

## 2016-01-08 MED ORDER — RIVAROXABAN 15 MG PO TABS: 15.0000 mg | ORAL_TABLET | Freq: Every day | ORAL | Status: DC

## 2016-01-08 MED ORDER — HEPARIN SODIUM (PORCINE) 1000 UNIT/ML IJ SOLN
INTRAMUSCULAR | Status: DC | PRN
  Administered 2016-01-08: 10000 [IU] via INTRAVENOUS
  Administered 2016-01-08: 1000 [IU] via INTRAVENOUS

## 2016-01-08 MED ORDER — HYDRALAZINE HCL 20 MG/ML IJ SOLN
INTRAMUSCULAR | Status: AC
  Filled 2016-01-08: qty 1

## 2016-01-08 MED ORDER — IOPAMIDOL (ISOVUE-370) INJECTION 76%
INTRAVENOUS | Status: AC
  Filled 2016-01-08: qty 50

## 2016-01-08 MED ORDER — HYDROCODONE-ACETAMINOPHEN 5-325 MG PO TABS
ORAL_TABLET | ORAL | Status: AC
  Filled 2016-01-08: qty 1

## 2016-01-08 MED ORDER — SODIUM CHLORIDE 0.9 % IV SOLN
INTRAVENOUS | Status: DC
  Administered 2016-01-08 (×2): via INTRAVENOUS

## 2016-01-08 MED ORDER — LEVOTHYROXINE SODIUM 112 MCG PO TABS
112.0000 ug | ORAL_TABLET | Freq: Every day | ORAL | Status: DC
  Administered 2016-01-09: 112 ug via ORAL
  Filled 2016-01-08: qty 1

## 2016-01-08 MED ORDER — MELATONIN 3 MG PO TABS
9.0000 mg | ORAL_TABLET | Freq: Every day | ORAL | Status: DC
  Administered 2016-01-08: 9 mg via ORAL
  Filled 2016-01-08: qty 3

## 2016-01-08 MED ORDER — SODIUM CHLORIDE 0.9 % IV SOLN: 250.0000 mL | INTRAVENOUS | Status: DC | PRN

## 2016-01-08 MED ORDER — ONDANSETRON HCL 4 MG/2ML IJ SOLN: 4.0000 mg | Freq: Four times a day (QID) | INTRAMUSCULAR | Status: DC | PRN

## 2016-01-08 MED ORDER — MORPHINE SULFATE (PF) 2 MG/ML IV SOLN
INTRAVENOUS | Status: AC
  Filled 2016-01-08: qty 1

## 2016-01-08 MED ORDER — MORPHINE SULFATE (PF) 2 MG/ML IV SOLN
2.0000 mg | Freq: Once | INTRAVENOUS | Status: AC
  Administered 2016-01-08: 2 mg via INTRAVENOUS

## 2016-01-08 MED ORDER — IOPAMIDOL (ISOVUE-370) INJECTION 76%
INTRAVENOUS | Status: DC | PRN
  Administered 2016-01-08 (×5): 3 mL via INTRAVENOUS

## 2016-01-08 MED ORDER — FENTANYL CITRATE (PF) 100 MCG/2ML IJ SOLN
INTRAMUSCULAR | Status: DC | PRN
  Administered 2016-01-08: 50 ug via INTRAVENOUS
  Administered 2016-01-08: 25 ug via INTRAVENOUS
  Administered 2016-01-08: 50 ug via INTRAVENOUS
  Administered 2016-01-08 (×3): 25 ug via INTRAVENOUS

## 2016-01-08 MED ORDER — HEPARIN SODIUM (PORCINE) 1000 UNIT/ML IJ SOLN
INTRAMUSCULAR | Status: AC
  Filled 2016-01-08: qty 1

## 2016-01-08 MED ORDER — ACETAMINOPHEN 325 MG PO TABS
ORAL_TABLET | ORAL | Status: AC
  Filled 2016-01-08: qty 2

## 2016-01-08 MED ORDER — ISOPROTERENOL HCL 0.2 MG/ML IJ SOLN
INTRAVENOUS | Status: DC | PRN
  Administered 2016-01-08: 10 ug/min via INTRAVENOUS

## 2016-01-08 MED ORDER — TRAMADOL HCL 50 MG PO TABS
50.0000 mg | ORAL_TABLET | Freq: Every day | ORAL | Status: DC
  Administered 2016-01-08: 21:00:00 50 mg via ORAL
  Filled 2016-01-08: qty 1

## 2016-01-08 MED ORDER — LIDOCAINE HCL (CARDIAC) 20 MG/ML IV SOLN
INTRAVENOUS | Status: DC | PRN
  Administered 2016-01-08: 20 mg via INTRAVENOUS

## 2016-01-08 MED ORDER — HYDRALAZINE HCL 20 MG/ML IJ SOLN
5.0000 mg | Freq: Four times a day (QID) | INTRAMUSCULAR | Status: DC | PRN
  Administered 2016-01-08: 19:00:00 5 mg via INTRAVENOUS

## 2016-01-08 MED ORDER — OFF THE BEAT BOOK
Freq: Once | Status: AC
  Administered 2016-01-08: 22:00:00
  Filled 2016-01-08: qty 1

## 2016-01-08 MED ORDER — GABAPENTIN 100 MG PO CAPS
100.0000 mg | ORAL_CAPSULE | Freq: Every day | ORAL | Status: DC
  Administered 2016-01-08: 100 mg via ORAL
  Filled 2016-01-08: qty 1

## 2016-01-08 MED ORDER — IPRATROPIUM-ALBUTEROL 0.5-2.5 (3) MG/3ML IN SOLN
3.0000 mL | Freq: Once | RESPIRATORY_TRACT | Status: AC
  Administered 2016-01-08: 3 mL via RESPIRATORY_TRACT

## 2016-01-08 MED ORDER — FUROSEMIDE 10 MG/ML IJ SOLN
20.0000 mg | Freq: Once | INTRAMUSCULAR | Status: AC
Start: 2016-01-08 — End: 2016-01-08
  Administered 2016-01-08: 20 mg via INTRAVENOUS
  Filled 2016-01-08: qty 2

## 2016-01-08 MED ORDER — SODIUM CHLORIDE 0.9% FLUSH: 3.0000 mL | INTRAVENOUS | Status: DC | PRN

## 2016-01-08 MED ORDER — PROPOFOL 500 MG/50ML IV EMUL
INTRAVENOUS | Status: DC | PRN
  Administered 2016-01-08: 60 ug/kg/min via INTRAVENOUS

## 2016-01-08 MED ORDER — ISOPROTERENOL HCL 0.2 MG/ML IJ SOLN
INTRAMUSCULAR | Status: AC
  Filled 2016-01-08: qty 5

## 2016-01-08 MED ORDER — PROTAMINE SULFATE 10 MG/ML IV SOLN
INTRAVENOUS | Status: DC | PRN
  Administered 2016-01-08: 20 mg via INTRAVENOUS

## 2016-01-08 MED ORDER — HYDRALAZINE HCL 25 MG PO TABS
25.0000 mg | ORAL_TABLET | ORAL | Status: AC
  Administered 2016-01-08: 25 mg via ORAL
  Filled 2016-01-08: qty 1

## 2016-01-08 MED ORDER — BUPIVACAINE HCL (PF) 0.25 % IJ SOLN
INTRAMUSCULAR | Status: DC | PRN
  Administered 2016-01-08: 20 mL

## 2016-01-08 MED ORDER — HYDROCODONE-ACETAMINOPHEN 5-325 MG PO TABS
1.0000 | ORAL_TABLET | ORAL | Status: DC | PRN
  Administered 2016-01-08 (×2): 1 via ORAL
  Administered 2016-01-08: 21:00:00 2 via ORAL
  Filled 2016-01-08 (×2): qty 2

## 2016-01-08 MED ORDER — PROPOFOL 10 MG/ML IV BOLUS
INTRAVENOUS | Status: DC | PRN
  Administered 2016-01-08: 20 mg via INTRAVENOUS
  Administered 2016-01-08: 10 mg via INTRAVENOUS
  Administered 2016-01-08: 20 mg via INTRAVENOUS
  Administered 2016-01-08: 30 mg via INTRAVENOUS

## 2016-01-08 MED ORDER — MIDAZOLAM HCL 5 MG/5ML IJ SOLN
INTRAMUSCULAR | Status: DC | PRN
  Administered 2016-01-08: 1 mg via INTRAVENOUS
  Administered 2016-01-08 (×2): .5 mg via INTRAVENOUS

## 2016-01-08 MED ORDER — SODIUM CHLORIDE 0.9% FLUSH
3.0000 mL | Freq: Two times a day (BID) | INTRAVENOUS | Status: DC
  Administered 2016-01-08: 3 mL via INTRAVENOUS
  Administered 2016-01-09: 10:00:00 10 mL via INTRAVENOUS

## 2016-01-08 MED ORDER — ACETAMINOPHEN 325 MG PO TABS
650.0000 mg | ORAL_TABLET | ORAL | Status: DC | PRN
  Administered 2016-01-08 – 2016-01-09 (×2): 650 mg via ORAL
  Filled 2016-01-08 (×3): qty 2

## 2016-01-08 MED ORDER — HYDRALAZINE HCL 25 MG PO TABS
25.0000 mg | ORAL_TABLET | Freq: Three times a day (TID) | ORAL | Status: DC
  Administered 2016-01-09: 25 mg via ORAL
  Filled 2016-01-08: qty 1

## 2016-01-08 MED ORDER — IPRATROPIUM-ALBUTEROL 0.5-2.5 (3) MG/3ML IN SOLN
RESPIRATORY_TRACT | Status: AC
  Administered 2016-01-08: 3 mL via RESPIRATORY_TRACT
  Filled 2016-01-08: qty 3

## 2016-01-08 SURGICAL SUPPLY — 23 items
BAG SNAP BAND KOVER 36X36 (MISCELLANEOUS) ×3 IMPLANT
BLANKET WARM UNDERBOD FULL ACC (MISCELLANEOUS) ×3 IMPLANT
CATH NAVISTAR SMARTTOUCH DF (ABLATOR) ×2 IMPLANT
CATH SOUNDSTAR 3D IMAGING (CATHETERS) ×3 IMPLANT
CATH VARIABLE LASSO NAV 2515 (CATHETERS) ×2 IMPLANT
CATH WEBSTER BI DIR CS D-F CRV (CATHETERS) ×3 IMPLANT
GUIDEWIRE INQWIRE 1.5J.035X260 (WIRE) ×1 IMPLANT
INQWIRE 1.5J .035X260CM (WIRE) ×3
NDL BAYLIS TRANSSEPTAL 71CM (NEEDLE) IMPLANT
NDL TRANSSPETAL BRK-XS 71CM (NEEDLE) ×1 IMPLANT
NEEDLE BAYLIS TRANSSEPTAL 71CM (NEEDLE) ×3 IMPLANT
NEEDLE TRANSEP BRK 71CM 407200 (NEEDLE) ×3 IMPLANT
NEEDLE TRANSSPETAL BRK-XS 71CM (NEEDLE) ×3
PACK EP LATEX FREE (CUSTOM PROCEDURE TRAY) ×3
PACK EP LF (CUSTOM PROCEDURE TRAY) ×1 IMPLANT
PAD DEFIB LIFELINK (PAD) ×3 IMPLANT
PATCH CARTO3 (PAD) ×2 IMPLANT
SHEATH AVANTI 11F 11CM (SHEATH) ×2 IMPLANT
SHEATH PINNACLE 7F 10CM (SHEATH) ×6 IMPLANT
SHEATH PINNACLE 9F 10CM (SHEATH) ×2 IMPLANT
SHEATH SWARTZ TS SL2 63CM 8.5F (SHEATH) ×3 IMPLANT
SHIELD RADPAD SCOOP 12X17 (MISCELLANEOUS) ×3 IMPLANT
TUBING SMART ABLATE COOLFLOW (TUBING) ×2 IMPLANT

## 2016-01-08 NOTE — Progress Notes (Signed)
Patient continues to c/o rt groin pain. Dr. Rayann Heman was in earlier to look at patient and rt groin. Dr. Rayann Heman paged and made aware of continued pain. Site level 0. Rt dp 2+. Medicated for pain.

## 2016-01-08 NOTE — Progress Notes (Signed)
Patient c/o right groin hurting. Level 0, no bruising however patient guards site when assessed. BRobinson in to assess and is holding manual pressure. No hematoma visible or palpable but area is painful.

## 2016-01-08 NOTE — Progress Notes (Signed)
Paged Harlan Stains, NP R/T worsening groin pain r/t sheath pull/hematoma and increasing shortness of breath and expiratory wheezing. Mancel Bale and Print production planner at bedside. CXR ordered will follow up, RT also called for stat neb.

## 2016-01-08 NOTE — Progress Notes (Signed)
Site area: rt groin 3 fv sheaths Site Prior to Removal:  Level 0 Pressure Applied For: 20 minutes Manual:   yes Patient Status During Pull:  stable Post Pull Site:  Level 0   Post Pull Instructions Given:  yes Post Pull Pulses Present: yes Dressing Applied:  Gauze/small tegaderm    Bedrest begins @ 1200 Comments:

## 2016-01-08 NOTE — Progress Notes (Signed)
Report called to [facility] on [place].  Patient's VSS, right femoral vascular site level 0.

## 2016-01-08 NOTE — Anesthesia Preprocedure Evaluation (Addendum)
Anesthesia Evaluation  Patient identified by MRN, date of birth, ID band Patient awake    Reviewed: Allergy & Precautions, NPO status , Patient's Chart, lab work & pertinent test results  Airway Mallampati: II  TM Distance: <3 FB Neck ROM: Full    Dental no notable dental hx. (+) Dental Advidsory Given, Teeth Intact   Pulmonary former smoker,    Pulmonary exam normal breath sounds clear to auscultation       Cardiovascular hypertension, + CAD and +CHF  + dysrhythmias Atrial Fibrillation  Rhythm:Irregular Rate:Normal     Neuro/Psych  Headaches, negative psych ROS   GI/Hepatic negative GI ROS, Neg liver ROS,   Endo/Other  Hypothyroidism   Renal/GU negative Renal ROS  negative genitourinary   Musculoskeletal negative musculoskeletal ROS (+)   Abdominal   Peds negative pediatric ROS (+)  Hematology negative hematology ROS (+)   Anesthesia Other Findings Study Conclusions  - Left ventricle: The cavity size was normal. Systolic function was   normal. The estimated ejection fraction was in the range of 60%   to 65%. Wall motion was normal; there were no regional wall   motion abnormalities. - Aorta: Ascending aortic diameter: 38 mm (S). - Ascending aorta: The ascending aorta was mildly dilated. - Mitral valve: There was mild regurgitation. - Left atrium: The atrium was mildly dilated. - Pulmonary arteries: Systolic pressure was mildly increased. PA   peak pressure: 41 mm Hg (S). - Pericardium, extracardiac: A trivial pericardial effusion was   identified posterior to the heart. There was no evidence of   hemodynamic compromise.  Reproductive/Obstetrics negative OB ROS                           Anesthesia Physical Anesthesia Plan  ASA: III  Anesthesia Plan: General   Post-op Pain Management:    Induction: Intravenous  Airway Management Planned: LMA and Oral ETT  Additional  Equipment:   Intra-op Plan:   Post-operative Plan: Extubation in OR  Informed Consent: I have reviewed the patients History and Physical, chart, labs and discussed the procedure including the risks, benefits and alternatives for the proposed anesthesia with the patient or authorized representative who has indicated his/her understanding and acceptance.   Dental Advisory Given  Plan Discussed with: CRNA and Surgeon  Anesthesia Plan Comments:        Anesthesia Quick Evaluation

## 2016-01-08 NOTE — Anesthesia Postprocedure Evaluation (Signed)
Anesthesia Post Note  Patient: Sarah Mays  Procedure(s) Performed: Procedure(s) (LRB): Atrial Fibrillation Ablation (N/A)  Patient location during evaluation: PACU Anesthesia Type: MAC Level of consciousness: awake and alert Pain management: pain level controlled Vital Signs Assessment: post-procedure vital signs reviewed and stable Respiratory status: spontaneous breathing, nonlabored ventilation and respiratory function stable Cardiovascular status: stable and blood pressure returned to baseline Anesthetic complications: no    Last Vitals:  Vitals:   01/08/16 1155 01/08/16 1200  BP: (!) 141/71 (!) 147/74  Pulse: 69 70  Resp: 15 17  Temp:      Last Pain:  Vitals:   01/08/16 0537  TempSrc: Oral                 Kirstie Larsen,W. EDMOND

## 2016-01-08 NOTE — H&P (Signed)
PCP:  Hollace Kinnier, DO Primary Cardiologist:  Dr Aundra Dubin  CC:AF  The patient presents today for afib ablation.  She continues to have difficulty with recurrent symptomatic afib.  She has tachypalpitations with heart rates frequently greater than 100 bpm during afib.  She also has frequent sinus bradycardia on amiodarone.  She has had some fatigue with this.  She denies any other symptoms. Today, she denies symptoms of chest pain, shortness of breath, orthopnea, PND, lower extremity edema, dizziness, presyncope, syncope, or neurologic sequela.  The patient feels that she is tolerating medications without difficulties and is otherwise without complaint today.       Past Medical History:  Diagnosis Date  . Abnormal stress test    a. 11/2011 Ex MV: EF 80%, small, partially reversible anteroapical defect->mild ischemia vs attenuation-->med Rx.  [name] Kitchen Alopecia, unspecified   . Backache, unspecified   . Chronic diastolic CHF (congestive heart failure) ([facility])    a. 11/2013 Echo: EF 60-65%, no rwma, mild TR, PASP 42mmHg.  . Closed fracture of lower end of radius with ulna   . Deafness    Left, s/p multiple surgeries  . Edema   . Essential hypertension   . Gouty arthropathy, unspecified   . Headache(784.0)   . Hypercalcemia   . Hyperpotassemia   . Hyposmolality and/or hyponatremia   . Hypothyroidism   . Insomnia, unspecified   . Macular degeneration    Left, s/p inj. tx  . Mitral valve disorders(424.0)   . Neck mass    right, w/u including MRI negative  . Other malaise and fatigue   . PAF (paroxysmal atrial fibrillation) (HCC)    a. on amio (02/2012 mild obstruction on PFT's) and xarelto.  . Polycythemia vera(238.4)   . Pure hypercholesterolemia   . Senile osteoporosis    Reclast in the past  . Tricuspid valve disorders, specified as nonrheumatic         Past Surgical History:  Procedure Laterality Date  . ABLATION  08/28/14   Back   . BREAST BIOPSY   06/18/1998   left  . BREAST BIOPSY    . FEMUR IM NAIL Right 10/08/2015   Procedure: INTRAMEDULLARY (IM) NAIL FEMORAL RIGHT;  Surgeon: Paralee Cancel, MD;  Location: [facility];  Service: Orthopedics;  Laterality: Right;  . TONSILLECTOMY    . TOTAL ABDOMINAL HYSTERECTOMY    . TYMPANOPLASTY Bilateral           Current Outpatient Prescriptions  Medication Sig Dispense Refill  . amiodarone (PACERONE) 100 MG tablet Take 1 tablet (100 mg total) by mouth daily. 30 tablet 3  . cholecalciferol (VITAMIN D) 1000 UNITS tablet Take 2,000 Units by mouth daily.     . fentaNYL (DURAGESIC - DOSED MCG/HR) 25 MCG/HR patch Place 1 patch (25 mcg total) onto the skin every 3 (three) days. 10 patch 0  . furosemide (LASIX) 40 MG tablet Take 2 tablets (80 mg) by mouth in the morning and take 1.5 tablets (60 mg) in the evening    . gabapentin (NEURONTIN) 100 MG capsule Take 100 mg by mouth at bedtime.     [name] Kitchen guaiFENesin (MUCINEX) 600 MG 12 hr tablet Take 600 mg by mouth 2 (two) times daily as needed for cough or to loosen phlegm.    . hydrALAZINE (APRESOLINE) 25 MG tablet Take 1 tablet (25 mg total) by mouth 3 (three) times daily. 270 tablet 1  . hydroxyurea (HYDREA) 500 MG capsule May take with food to minimize GI side effects. Take  500 mg daily from Mondays to Fridays and take 1000 mg on Saturdays and Sundays 40 capsule 9  . levothyroxine (SYNTHROID, LEVOTHROID) 112 MCG tablet Take 112 mcg by mouth daily before breakfast.    . Magnesium 250 MG TABS Take 250 mg by mouth daily.     . Melatonin 5 MG CAPS Take 5 mg by mouth at bedtime.     . Multiple Vitamins-Minerals (PRESERVISION AREDS 2) CAPS Take 1 capsule by mouth 2 (two) times daily.     . polycarbophil (FIBERCON) 625 MG tablet Take 2 tablets (1,250 mg total) by mouth 2 (two) times daily. 60 tablet 0  . potassium chloride (K-DUR,KLOR-CON) 10 MEQ tablet Take 1 tablet (10 mEq total) by mouth 2 (two) times daily. 60 tablet 0  . Rivaroxaban  (XARELTO) 15 MG TABS tablet Take 15 mg by mouth daily with supper.    . bisacodyl (DULCOLAX) 10 MG suppository Place 1 suppository (10 mg total) rectally daily as needed for moderate constipation. (Patient not taking: Reported on 12/01/2015) 12 suppository 0   No current facility-administered medications for this visit.          Allergies  Allergen Reactions  . Amlodipine Swelling and Other (See Comments)    Reaction:  Lower extremity swelling    Social History        Social History  . Marital status: Married    Spouse name: [name]  . Number of children: 2  . Years of education: 16      Occupational History  . Not on file.         Social History Main Topics  . Smoking status: Former Smoker    Types: Cigarettes    Quit date: 01/19/1984  . Smokeless tobacco: Never Used  . Alcohol use 0.6 oz/week    1 Glasses of wine per week     Comment: 2 per day/ 14 a week  . Drug use: No  . Sexual activity: Not Currently       Other Topics Concern  . Not on file      Social History Narrative   Patient is Married 1955. 2 kids. 32 grandkid-70 years old in 2016. College graduate [name] [place]).    Lives in apartment,  Independent Living  section at [facility] since 02/2013.      Stay at home mother.       No Smoking history, Mod. alcohol use.   Patient has a living will, POA      Hobbies: CPU and painting                   Family History  Problem Relation Age of Onset  . Hypertension Father   . Heart disease Father     patient does not know details.   . Ovarian cancer Mother 88  . Cancer Sister     colon  . Heart disease Sister   . Cancer Sister   . Cancer Sister     hodgkin disease  . Breast cancer Daughter   . Cancer Daughter     breast    ROS-  All systems are reviewed and are negative except as outlined in the HPI above  Physical Exam:    Vitals:   12/01/15 1413   BP: (!) 102/42  Pulse: (!) 53  Weight: 114 lb 4 oz (51.8 kg)  Height: 5' (1.524 m)    GEN- The patient is well appearing, alert and oriented x 3 today.  Head- normocephalic, atraumatic Eyes-  Sclera clear, conjunctiva pink Ears- hearing intact Oropharynx- clear Neck- supple, no JVP Lymph- no cervical lymphadenopathy Lungs- Clear to ausculation bilaterally, normal work of breathing Heart- Regular rate and rhythm, no murmurs, rubs or gallops, PMI not laterally displaced GI- soft, NT, ND, + BS Extremities- no clubbing, cyanosis, or edema  Cardiac CT reviewed  Assessment and Plan:  1. Paroxysmal atrial fibrillation Recurrent symptomatic atrial fibrillation.  She has failed medical therapy with amiodarone.  She is appropriately anticoagulated.  Her chads2vasc score is at least 4.   CrCl 42.  She is on xarelto 15mg  daily appropriately.  Therapeutic strategies for afib including pacemaker implantation, medicine and ablation were discussed in detail with the patient today. Risk, benefits, and alternatives to EP study and radiofrequency ablation for afib were also discussed in detail today. These risks include but are not limited to stroke, bleeding, vascular damage, tamponade, perforation, damage to the esophagus, lungs, and other structures, pulmonary vein stenosis, worsening renal function, and death. The patient understands these risk and wishes to proceed.  Cardiac CT is reviewed and reveals no LAA thrombus.  2. Sick sinus syndrome Presently, her heart rates are stable. Hopefully we will be able to discontinue amiodarone post ablation.  I am optimistic that her heart rates may improve and we could thus avoid PPM.  Thompson Grayer MD, [facility] 01/08/2016 7:16 AM

## 2016-01-08 NOTE — Transfer of Care (Signed)
Immediate Anesthesia Transfer of Care Note  Patient: Sarah Mays  Procedure(s) Performed: Procedure(s): Atrial Fibrillation Ablation (N/A)  Patient Location: Cath Lab  Anesthesia Type:MAC  Level of Consciousness: awake, alert  and oriented  Airway & Oxygen Therapy: Patient Spontanous Breathing  Post-op Assessment: Report given to RN, Post -op Vital signs reviewed and stable and Patient moving all extremities X 4  Post vital signs: Reviewed and stable  Last Vitals:  Vitals:   01/08/16 0537  BP: (!) 165/85  Pulse: 67  Resp: 18  Temp: 36.9 C    Last Pain:  Vitals:   01/08/16 0537  TempSrc: Oral         Complications: No apparent anesthesia complications

## 2016-01-08 NOTE — Progress Notes (Signed)
Called about patient experiencing dyspnea and right groin pain. Was given 2mg  morphine for groin pain. On exam she has noted expiratory wheezing. Vital signs stable no tachycardia or hypotension. Right groin site with small hematoma, but no active bleeding. Obtain CXR and give breathing treatment.

## 2016-01-08 NOTE — Anesthesia Procedure Notes (Signed)
Procedure Name: MAC Date/Time: 01/08/2016 8:00 AM Performed by: Neldon Newport Pre-anesthesia Checklist: Timeout performed, Patient being monitored, Suction available, Emergency Drugs available and Patient identified Patient Re-evaluated:Patient Re-evaluated prior to inductionOxygen Delivery Method: Simple face mask Placement Confirmation: positive ETCO2

## 2016-01-09 ENCOUNTER — Encounter (HOSPITAL_COMMUNITY): Payer: Self-pay | Admitting: Nurse Practitioner

## 2016-01-09 DIAGNOSIS — Z7901 Long term (current) use of anticoagulants: Secondary | ICD-10-CM | POA: Diagnosis not present

## 2016-01-09 DIAGNOSIS — I480 Paroxysmal atrial fibrillation: Secondary | ICD-10-CM | POA: Diagnosis not present

## 2016-01-09 DIAGNOSIS — E039 Hypothyroidism, unspecified: Secondary | ICD-10-CM | POA: Diagnosis not present

## 2016-01-09 DIAGNOSIS — I495 Sick sinus syndrome: Secondary | ICD-10-CM | POA: Diagnosis not present

## 2016-01-09 DIAGNOSIS — I5032 Chronic diastolic (congestive) heart failure: Secondary | ICD-10-CM | POA: Diagnosis not present

## 2016-01-09 DIAGNOSIS — I509 Heart failure, unspecified: Secondary | ICD-10-CM | POA: Diagnosis not present

## 2016-01-09 DIAGNOSIS — Z87891 Personal history of nicotine dependence: Secondary | ICD-10-CM | POA: Diagnosis not present

## 2016-01-09 DIAGNOSIS — E7800 Pure hypercholesterolemia, unspecified: Secondary | ICD-10-CM | POA: Diagnosis not present

## 2016-01-09 DIAGNOSIS — D45 Polycythemia vera: Secondary | ICD-10-CM | POA: Diagnosis not present

## 2016-01-09 DIAGNOSIS — I110 Hypertensive heart disease with heart failure: Secondary | ICD-10-CM | POA: Diagnosis not present

## 2016-01-09 LAB — CBC
HCT: 34.9 % — ABNORMAL LOW (ref 36.0–46.0)
Hemoglobin: 11.2 g/dL — ABNORMAL LOW (ref 12.0–15.0)
MCH: 37.2 pg — AB (ref 26.0–34.0)
MCHC: 32.1 g/dL (ref 30.0–36.0)
MCV: 115.9 fL — AB (ref 78.0–100.0)
PLATELETS: 254 10*3/uL (ref 150–400)
RBC: 3.01 MIL/uL — ABNORMAL LOW (ref 3.87–5.11)
RDW: 16.1 % — AB (ref 11.5–15.5)
WBC: 10.6 10*3/uL — ABNORMAL HIGH (ref 4.0–10.5)

## 2016-01-09 LAB — BASIC METABOLIC PANEL
Anion gap: 9 (ref 5–15)
BUN: 12 mg/dL (ref 6–20)
CALCIUM: 9.1 mg/dL (ref 8.9–10.3)
CHLORIDE: 101 mmol/L (ref 101–111)
CO2: 26 mmol/L (ref 22–32)
CREATININE: 0.86 mg/dL (ref 0.44–1.00)
GFR calc Af Amer: >60 mL/min (ref >60)
GFR calc non Af Amer: >60 mL/min (ref >60)
Glucose, Bld: 95 mg/dL (ref 65–99)
Potassium: 3.9 mmol/L (ref 3.5–5.1)
Sodium: 136 mmol/L (ref 135–145)

## 2016-01-09 MED ORDER — PANTOPRAZOLE SODIUM 40 MG PO TBEC: 40.0000 mg | DELAYED_RELEASE_TABLET | Freq: Every day | ORAL | 0 refills | Status: DC

## 2016-01-09 MED ORDER — FUROSEMIDE 10 MG/ML IJ SOLN
40.0000 mg | Freq: Once | INTRAMUSCULAR | Status: AC
  Administered 2016-01-09: 09:00:00 40 mg via INTRAVENOUS
  Filled 2016-01-09: qty 4

## 2016-01-09 NOTE — Care Management Note (Signed)
Case Management Note  Patient Details  Name: Sarah Mays MRN: [id number] Date of Birth: [birth year]  Subjective/Objective:    S/p ablation, lives alone, pta indep. She has a pcp, medication coverage and transportation at dc.  She is for dc today, no needs.                Action/Plan:   Expected Discharge Date:                  Expected Discharge Plan:  Home/Self Care  In-House Referral:     Discharge planning Services  CM Consult  Post Acute Care Choice:    Choice offered to:     DME Arranged:    DME Agency:     HH Arranged:    HH Agency:     Status of Service:  Completed, signed off  If discussed at [employer] of Stay Meetings, dates discussed:    Additional Comments:  [name], RN 01/09/2016, 9:12 AM

## 2016-01-09 NOTE — Discharge Summary (Signed)
ELECTROPHYSIOLOGY PROCEDURE DISCHARGE SUMMARY    Patient ID: Sarah Mays,  MRN: [id number], DOB/AGE: Apr 04, 1930 82 y.o.  Admit date: 01/08/2016 Discharge date: 01/09/2016  Primary Care Physician: Hollace Kinnier, DO Primary Cardiologist: Aundra Dubin Electrophysiologist: Thompson Grayer, MD  Primary Discharge Diagnosis:  1.  Paroxysmal atrial fibrillation status post ablation this admission  Secondary Discharge Diagnosis:  1.  Sick sinus syndrome 2.  Chronic diastolic heart failure 3.  HTN 4.  Polycythemia vera 5.  Hypothyroidism  Procedures This Admission:  1.  Electrophysiology study and radiofrequency catheter ablation on 01/08/16 by Dr Thompson Grayer.  This study demonstrated sinus rhythm upon presentation; rotational Angiography reveals a moderate sized left atrium with four separate pulmonary veins without evidence of pulmonary vein stenosis;  successful electrical isolation and anatomical encircling of all four pulmonary veins with radiofrequency current; no inducible arrhythmias following ablation both on and off of Isuprel;sinus node recovery time of 3.8 seconds, HV of 52 msec, and AVWCL of 600 msec at baseline.  No infrahisian block.  Likely a combination of age and amiodarone.  Though she may eventually require pacemaker, she has previously wished to avoid this.  I will therefore follow clinically off of amiodarone going forward; no early apparent complications..    Brief HPI: Sarah Mays is a 82 y.o. female with a history of paroxysmal atrial fibrillation.  They have failed medical therapy with amiodarone. Risks, benefits, and alternatives to catheter ablation of atrial fibrillation were reviewed with the patient who wished to proceed.  The patient underwent cardiac CT prior to the procedure which demonstrated no LAA thrombus.    Hospital Course:  The patient was admitted and underwent EPS/RFCA of atrial fibrillation with details as outlined above.  They were monitored on  telemetry overnight which demonstrated sinus rhythm with frequent PACs, PVCs.  Short sinus pauses were also noted.  Groin was without complication on the day of discharge.  She did have some SOB.  This resolved with resumption of her lasix.   The patient was examined and considered to be stable for discharge.  Wound care and restrictions were reviewed with the patient.  The patient will be seen back by Roderic Palau, NP in 4 weeks and Dr Rayann Heman in 12 weeks for post ablation follow up.   Physical Exam: Vitals:   01/08/16 2005 01/08/16 2032 01/08/16 2140 01/09/16 0335  BP:  (!) 169/68 139/60 138/63  Pulse:  83 80 (!) 55  Resp:  20 17 (!) 26  Temp:  97 F (36.1 C)  (!) 96.8 F (36 C)  TempSrc:  Oral  Axillary  SpO2: 96% 97% 96% 96%  Weight:    119 lb 7.8 oz (54.2 kg)  Height:        GEN- The patient is well appearing, alert and oriented x 3 today.   HEENT: normocephalic, atraumatic; sclera clear, conjunctiva pink; hearing intact; oropharynx clear; neck supple  Lungs- few basilar rales,  normal work of breathing.  No wheezes, rales, rhonchi Heart- Regular rate and rhythm with ectopy, no murmurs, rubs or gallops  GI- soft, non-tender, non-distended, bowel sounds present  Extremities- no clubbing, cyanosis, or edema; DP/PT/radial pulses 2+ bilaterally, groin without hematoma/bruit MS- no significant deformity or atrophy JVP elevated Skin- warm and dry, no rash or lesion Psych- euthymic mood, full affect Neuro- strength and sensation are intact   Labs:   Lab Results  Component Value Date   WBC 10.6 (H) 01/09/2016   HGB 11.2 (L) 01/09/2016  HCT 34.9 (L) 01/09/2016   MCV 115.9 (H) 01/09/2016   PLT 254 01/09/2016     Recent Labs Lab 01/08/16 0546  NA 136  K 4.0  CL 99*  CO2 29  BUN 21*  CREATININE 0.76  CALCIUM 10.8*  GLUCOSE 87     Discharge Medications:    Medication List    TAKE these medications   amiodarone 200 MG tablet Commonly known as:  PACERONE Take 100  mg by mouth daily.   cholecalciferol 1000 units tablet Commonly known as:  VITAMIN D Take 2,000 Units by mouth daily.   fentaNYL 25 MCG/HR patch Commonly known as:  DURAGESIC - dosed mcg/hr Place 1 patch (25 mcg total) onto the skin every 3 (three) days.   furosemide 40 MG tablet Commonly known as:  LASIX Take 2 tablets (80 mg) by mouth in the morning and take 1.5 tablets (60 mg) in the evening   gabapentin 100 MG capsule Commonly known as:  NEURONTIN Take 100 mg by mouth at bedtime.   hydrALAZINE 25 MG tablet Commonly known as:  APRESOLINE Take 1 tablet (25 mg total) by mouth 3 (three) times daily.   hydroxyurea 500 MG capsule Commonly known as:  HYDREA TAKE 2 CAPSULES ON MONDAYS AND TAKE 1 CAPSULE ALL OTHER DAYS OF THE WEEK. What changed:  See the new instructions.   levothyroxine 112 MCG tablet Commonly known as:  SYNTHROID, LEVOTHROID Take 112 mcg by mouth daily before breakfast.   Magnesium 250 MG Tabs Take 250 mg by mouth daily.   Melatonin 10 MG Tabs Take 10 mg by mouth at bedtime.   pantoprazole 40 MG tablet Commonly known as:  PROTONIX Take 1 tablet (40 mg total) by mouth daily.   polycarbophil 625 MG tablet Commonly known as:  FIBERCON Take 2 tablets (1,250 mg total) by mouth 2 (two) times daily.   potassium chloride 10 MEQ tablet Commonly known as:  K-DUR,KLOR-CON Take 1 tablet (10 mEq total) by mouth 2 (two) times daily.   PRESERVISION AREDS 2 Caps Take 2 capsules by mouth daily.   Rivaroxaban 15 MG Tabs tablet Commonly known as:  XARELTO Take 15 mg by mouth daily with supper.   traMADol 50 MG tablet Commonly known as:  ULTRAM Take 50 mg by mouth at bedtime.       Disposition:   Follow-up Information     [facility] Follow up on 02/05/2016.   Specialty:  Cardiology Why:  at [facility] information: [address] [id number] [place] [id number] [phone]       Thompson Grayer, MD  Follow up on 04/12/2016.   Specialty:  Cardiology Why:  at [facility] information: [place] [address] [id number] [phone]           Duration of Discharge Encounter: Greater than 30 minutes including physician time.  Army Fossa MD 01/09/2016 8:09 AM

## 2016-01-09 NOTE — Discharge Instructions (Signed)
No driving for 4 days. No lifting over 5 lbs for 1 week. No sexual activity for 1 week. You may return to work in 1 week. Keep procedure site clean & dry. If you notice increased pain, swelling, bleeding or pus, call/return!  You may shower, but no soaking baths/hot tubs/pools for 1 week.  ° ° °You have an appointment set up with the Atrial Fibrillation Clinic.  Multiple studies have shown that being followed by a dedicated atrial fibrillation clinic in addition to the standard care you receive from your other physicians improves health. We believe that enrollment in the atrial fibrillation clinic will allow us to better care for you.  ° °The phone number to the Atrial Fibrillation Clinic is [phone]. The clinic is staffed Monday through Friday from 8:30am to 5pm. ° °Parking Directions: The clinic is located in the Heart and Vascular Building connected to [facility]. °1)From [address] turn on to [address] and go to the 3rd entrance  (Heart and Vascular entrance) on the right. °2)Look to the right for Heart &Vascular Parking Garage. °3)A code for the entrance is required please call the clinic to receive this.   °4)Take the elevators to the 1st floor. Registration is in the room with the glass walls at the end of the hallway. ° °If you have any trouble parking or locating the clinic, please don’t hesitate to call [phone]. ° ° °

## 2016-01-09 NOTE — Progress Notes (Signed)
Discussed poc with Dr. Rayann Heman, pt and daughter.  IV Lasix given per MD order.  Will call daughter back when pt ready for d/c after Lasix works a couple hours.  Pt denies pain or SOB, up to brush teeth with walker, steady on feet.  AF 50-60's.  Dr. Rayann Heman aware pt had occas drop in HR<40 nonsustained.  Call light in reach, agrees to call for assist to bathroom.

## 2016-01-11 ENCOUNTER — Encounter: Payer: Self-pay | Admitting: Cardiology

## 2016-01-12 ENCOUNTER — Telehealth: Payer: Self-pay | Admitting: *Deleted

## 2016-01-12 ENCOUNTER — Encounter (HOSPITAL_COMMUNITY): Payer: Self-pay | Admitting: Nurse Practitioner

## 2016-01-12 ENCOUNTER — Other Ambulatory Visit: Payer: Self-pay | Admitting: Hematology and Oncology

## 2016-01-12 ENCOUNTER — Ambulatory Visit (HOSPITAL_COMMUNITY)
Admission: RE | Admit: 2016-01-12 | Discharge: 2016-01-12 | Disposition: A | Discharge status: Home / Self Care | Payer: Medicare Other | Source: Ambulatory Visit | Attending: Nurse Practitioner | Admitting: Nurse Practitioner

## 2016-01-12 ENCOUNTER — Telehealth: Payer: Self-pay

## 2016-01-12 VITALS — BP 156/82 | HR 74 | Ht 60.0 in | Wt 118.2 lb

## 2016-01-12 DIAGNOSIS — I110 Hypertensive heart disease with heart failure: Secondary | ICD-10-CM | POA: Diagnosis not present

## 2016-01-12 DIAGNOSIS — R221 Localized swelling, mass and lump, neck: Secondary | ICD-10-CM | POA: Diagnosis not present

## 2016-01-12 DIAGNOSIS — I4891 Unspecified atrial fibrillation: Secondary | ICD-10-CM | POA: Diagnosis present

## 2016-01-12 DIAGNOSIS — I5032 Chronic diastolic (congestive) heart failure: Secondary | ICD-10-CM | POA: Insufficient documentation

## 2016-01-12 DIAGNOSIS — R609 Edema, unspecified: Secondary | ICD-10-CM | POA: Diagnosis not present

## 2016-01-12 DIAGNOSIS — E039 Hypothyroidism, unspecified: Secondary | ICD-10-CM | POA: Diagnosis not present

## 2016-01-12 DIAGNOSIS — M25561 Pain in right knee: Secondary | ICD-10-CM | POA: Insufficient documentation

## 2016-01-12 DIAGNOSIS — E7800 Pure hypercholesterolemia, unspecified: Secondary | ICD-10-CM | POA: Insufficient documentation

## 2016-01-12 DIAGNOSIS — I498 Other specified cardiac arrhythmias: Secondary | ICD-10-CM | POA: Diagnosis not present

## 2016-01-12 DIAGNOSIS — M109 Gout, unspecified: Secondary | ICD-10-CM | POA: Diagnosis not present

## 2016-01-12 DIAGNOSIS — I480 Paroxysmal atrial fibrillation: Secondary | ICD-10-CM | POA: Diagnosis not present

## 2016-01-12 DIAGNOSIS — Z87891 Personal history of nicotine dependence: Secondary | ICD-10-CM | POA: Insufficient documentation

## 2016-01-12 DIAGNOSIS — Z9889 Other specified postprocedural states: Secondary | ICD-10-CM | POA: Diagnosis not present

## 2016-01-12 NOTE — Telephone Encounter (Signed)
Pt returned your call-pls call back at [phone]

## 2016-01-12 NOTE — Telephone Encounter (Signed)
I spoke with patient, see phone note dated 01/12/16

## 2016-01-12 NOTE — Telephone Encounter (Signed)
Follow up      Calling the nurse to report findings on exam of patient.  Please call

## 2016-01-12 NOTE — Telephone Encounter (Signed)
Pt called to say BP this morning by nurse at [facility] was 170/100, feels she needs office visit today.

## 2016-01-12 NOTE — Telephone Encounter (Signed)
Pt states she was discharged from hospital 01/09/16 after atrial fibrillation ablation.  Pt states while in the hospital she had edema from feet up to groin, this has improved some over the weekend but not completely resolved.  Pt states she does not weigh daily but also feels full in abdomen. Pt states she had some shortness of breath in the hospital and was given a breathing treatment that helped. Pt states her breathing is okay.  Pt did confirm her lasix dose in 2 of a 40mg  tablet in the AM and 1.5 of a 40mg  tablet in the PM. Pt states in the past she has increased her lasix dose for a few days and that has helped.   Pt advised I will forward to Dr Aundra Dubin for review. I offered to arrange appt for pt today, she states she is comfortable with forwarding information to Dr Aundra Dubin for review and recommendations.

## 2016-01-12 NOTE — Telephone Encounter (Signed)
Possible re-admission to facility. This is a patient you were seeing at [facility]. [facility] F/U is needed if patient was re-admitted to facility upon discharge. Hospital discharge from [facility] on 01/09/16. Pt had a electrophysiology catheter ablation. She does have a F/U with Dr. Rayann Heman on 02/05/16.

## 2016-01-12 NOTE — Telephone Encounter (Signed)
Pt has been scheduled to see Roderic Palau, NP today at [facility] for pt.

## 2016-01-12 NOTE — Patient Instructions (Signed)
Your physician has recommended you make the following change in your medication:  1)Increase lasix to 80mg  twice a day for 3 days only and then return to normal dosing.

## 2016-01-12 NOTE — Telephone Encounter (Signed)
Sarah Mays, would ultimately have her see me in CHF clinic.  I can work her in to my next available [employer] day though.

## 2016-01-12 NOTE — Progress Notes (Signed)
Primary Care Physician: Hollace Kinnier, DO Referring Physician: Self referred Sarah Mays is a 82 y.o. female with a h/o afib that had a afib ablation 11/30 and is in the afib clinic for f/u of edema for lower extremities.  She states she had shortness of breath after the procedure and given 40 mg lasix IV prior to d/c. She believes that she did not have her usual am/pm lasix day of procedure.  Her weight at d/c was 119 lbs and is 118 in the clinic but her usual dry weight is 114 lbs. She has also a large bruise of her rt thigh, looks to be resolving and has had a few stabbing pains in her rt knee which is improved with tylenol today.  She is in SR and has not noted any afib. Continues on xarelto.  Today, she denies symptoms of palpitations, chest pain,   orthopnea, PND,  dizziness, presyncope, syncope, or neurologic sequela. Positive for shortness of breath and lower extremity edema. The patient is tolerating medications without difficulties and is otherwise without complaint today.   Past Medical History:  Diagnosis Date  . Abnormal stress test    a. 11/2011 Ex MV: EF 80%, small, partially reversible anteroapical defect->mild ischemia vs attenuation-->med Rx.  Sarah Mays, unspecified   . Chronic diastolic CHF (congestive heart failure) ([facility])    a. 11/2013 Echo: EF 60-65%, no rwma, mild TR, PASP 46mmHg.  . Closed fracture of lower end of radius with ulna   . Deafness    Left, s/p multiple surgeries  . Essential hypertension   . Gouty arthropathy, unspecified   . Hypothyroidism   . Insomnia, unspecified   . Macular degeneration    Left, s/p inj. tx  . Mitral valve disorders(424.0)   . Neck mass    right, w/u including MRI negative  . PAF (paroxysmal atrial fibrillation) ([facility])    a. on amio (02/2012 mild obstruction on PFT's) and xarelto.  . Polycythemia vera(238.4)   . Pure hypercholesterolemia   . Senile osteoporosis    Reclast in the past  . Tricuspid  valve disorders, specified as nonrheumatic    Past Surgical History:  Procedure Laterality Date  . ABLATION  08/28/14   Back   . BREAST BIOPSY  06/18/1998   left  . BREAST BIOPSY    . ELECTROPHYSIOLOGIC STUDY N/A 01/08/2016   Procedure: Atrial Fibrillation Ablation;  Surgeon: Thompson Grayer, MD;  Location: [facility];  Service: Cardiovascular;  Laterality: N/A;  . FEMUR IM NAIL Right 10/08/2015   Procedure: INTRAMEDULLARY (IM) NAIL FEMORAL RIGHT;  Surgeon: Paralee Cancel, MD;  Location: [facility];  Service: Orthopedics;  Laterality: Right;  . TONSILLECTOMY    . TOTAL ABDOMINAL HYSTERECTOMY    . TYMPANOPLASTY Bilateral     Current Outpatient Prescriptions  Medication Sig Dispense Refill  . amiodarone (PACERONE) 200 MG tablet Take 100 mg by mouth daily.    . cholecalciferol (VITAMIN D) 1000 UNITS tablet Take 2,000 Units by mouth daily.     . fentaNYL (DURAGESIC - DOSED MCG/HR) 25 MCG/HR patch Place 1 patch (25 mcg total) onto the skin every 3 (three) days. 10 patch 0  . furosemide (LASIX) 40 MG tablet Take 2 tablets (80 mg) by mouth in the morning and take 1.5 tablets (60 mg) in the evening    . gabapentin (NEURONTIN) 100 MG capsule Take 100 mg by mouth at bedtime.     . hydrALAZINE (APRESOLINE) 25 MG  tablet Take 1 tablet (25 mg total) by mouth 3 (three) times daily. 270 tablet 1  . hydroxyurea (HYDREA) 500 MG capsule TAKE 2 CAPSULES ON MONDAYS AND TAKE 1 CAPSULE ALL OTHER DAYS OF THE WEEK. (Patient taking differently: TAKE 2 CAPSULES ON Saturday and Sunday  AND TAKE 1 CAPSULE ALL OTHER DAYS OF THE WEEK.) 35 capsule 0  . levothyroxine (SYNTHROID, LEVOTHROID) 112 MCG tablet Take 112 mcg by mouth daily before breakfast.    . Magnesium 250 MG TABS Take 250 mg by mouth daily.     . Melatonin 10 MG TABS Take 10 mg by mouth at bedtime.    . Multiple Vitamins-Minerals (PRESERVISION AREDS 2) CAPS Take 2 capsules by mouth daily.     . pantoprazole (PROTONIX) 40 MG tablet Take 1 tablet (40 mg total)  by mouth daily. 45 tablet 0  . polycarbophil (FIBERCON) 625 MG tablet Take 2 tablets (1,250 mg total) by mouth 2 (two) times daily. 60 tablet 0  . potassium chloride (K-DUR,KLOR-CON) 10 MEQ tablet Take 1 tablet (10 mEq total) by mouth 2 (two) times daily. 60 tablet 0  . Rivaroxaban (XARELTO) 15 MG TABS tablet Take 15 mg by mouth daily with supper.    . traMADol (ULTRAM) 50 MG tablet Take 50 mg by mouth at bedtime.     No current facility-administered medications for this encounter.     Allergies  Allergen Reactions  . Amlodipine Swelling and Other (See Comments)    Reaction:  Lower extremity swelling     Social History   Social History  . Marital status: Married    Spouse name: [name]  . Number of children: 2  . Years of education: 16   Occupational History  . Not on file.   Social History Main Topics  . Smoking status: Former Smoker    Types: Cigarettes    Quit date: 01/19/1984  . Smokeless tobacco: Never Used  . Alcohol use 0.6 oz/week    1 Glasses of wine per week     Comment: 2 per day/ 14 a week  . Drug use: No  . Sexual activity: Not Currently   Other Topics Concern  . Not on file   Social History Narrative   Patient is Married 1955. 2 kids. 81 grandkid-64 years old in 2016. College graduate [employer]. [employer] [place]).    Lives in apartment,  Independent Living  section at [facility] since 02/2013.      Stay at home mother.       No Smoking history, Mod. alcohol use.   Patient has a living will, POA      Hobbies: CPU and painting             Family History  Problem Relation Age of Onset  . Hypertension Father   . Heart disease Father     patient does not know details.   . Ovarian cancer Mother 57  . Cancer Sister     colon  . Heart disease Sister   . Cancer Sister   . Cancer Sister     hodgkin disease  . Breast cancer Daughter   . Cancer Daughter     breast    ROS- All systems are reviewed and negative except as per the HPI  above  Physical Exam: Vitals:   01/12/16 1513  BP: (!) 156/82  Pulse: 74  Weight: 118 lb 3.2 oz (53.6 kg)  Height: 5' (1.524 m)   Wt Readings from Last 3 Encounters:  01/12/16 118 lb 3.2 oz (53.6 kg)  01/09/16 119 lb 7.8 oz (54.2 kg)  12/17/15 115 lb (52.2 kg)    Labs: Lab Results  Component Value Date   NA 136 01/09/2016   K 3.9 01/09/2016   CL 101 01/09/2016   CO2 26 01/09/2016   GLUCOSE 95 01/09/2016   BUN 12 01/09/2016   CREATININE 0.86 01/09/2016   CALCIUM 9.1 01/09/2016   Lab Results  Component Value Date   INR 1.17 09/01/2014   Lab Results  Component Value Date   CHOL 162 07/29/2015   HDL 101 (A) 07/29/2015   LDLCALC 42 07/29/2015   TRIG 100 07/29/2015     GEN- The patient is well appearing, alert and oriented x 3 today.   Head- normocephalic, atraumatic Eyes-  Sclera clear, conjunctiva pink Ears- hearing intact Oropharynx- clear Neck- supple, no JVP Lymph- no cervical lymphadenopathy Lungs- Clear to ausculation bilaterally, normal work of breathing Heart- Regular rate and rhythm, no murmurs, rubs or gallops, PMI not laterally displaced GI- soft, NT, ND, + BS Extremities- no clubbing, cyanosis, 1+ edema left leg, 2+ rt leg(usually swells more on rt), purplish bruising pubic area and anterior rt thigh. No bruit. Sensation, color/warmth same in both legs. MS- no significant deformity or atrophy Skin- no rash or lesion Psych- euthymic mood, full affect Neuro- strength and sensation are intact  EKG- NSR at 74 bpm, Pr int 204 ms, qrs int 94 ms, qtc 448 ms Epic records reviewed   Assessment and Plan: 1. S/p afib ablation 11/30 Increase in LEE since procedure I think pt still has not diuresed extra IV fluid given at time of procedure Will increase lasix to 80 mg bid from 80 am and 60 pm  2. Rt thigh/knee pain Large bruise is present rt thigh Tylenol as needed Take it easy with activities Should improve with rest and over the next week If  continues to have issues, willl obtain U/S of rt groin  Recheck this Thursday in clinic, sooner if needed   Butch Penny C. Kymari Lollis, [facility] [address] [place], [place] [address] [phone]

## 2016-01-12 NOTE — Telephone Encounter (Signed)
Stacy with [facility] Clinic called and stated that patient was seen there today for Post OP issues after her Ablation. Stated that the patient is complaining of Constipation since Wednesday unrelieved by OTC medications. She would like your recommendation. Please Advise.   Would like for [place] to call patient back at [phone]

## 2016-01-12 NOTE — Telephone Encounter (Signed)
-----   Message -----  From: Dewaine Conger Essick  Sent: 01/11/2016 11:20 AM  To: [facility] Triage  Subject: Visit Follow-Up Question               Mom had a cardiac ablation last Thursday 11/30. Post-op she had SOB and very labored breathing, presumably from the IV fluids she was given to flush out the contrast plus the fact her am Lasix was held due to the procedure. She was given a breathing treatment and IV lasix that evening and the next morning before she was discharged and the next morning she was better. This morning, 3 days later, she still has edema in her feet particularly but also in legs, thighs, eyelids and her pants feel tighter in general.  In the past when she has had similar fluid/edema issues, she remembers you have upped her Lasix for ~5 days.  I've asked her to call your office in the am but wanted to provide some background as well by MyChart.   Mom's number is [phone]  Thanks!  Doristine Mango, her daughter  [phone] - mobile   01/12/16--I spoke with patient.

## 2016-01-12 NOTE — Telephone Encounter (Signed)
I spoke with [name], she is aware pt has appt today at 3PM with Roderic Palau, NP.

## 2016-01-12 NOTE — Telephone Encounter (Signed)
Follow Up Call   [name] [facility] ) is returning your call . Thanks

## 2016-01-12 NOTE — Telephone Encounter (Signed)
Pt lives in [place] so would need a clinic appt.

## 2016-01-13 NOTE — Telephone Encounter (Signed)
Patient notified and stated that her constipation has improved but she wants an appointment with Dr. Mariea Clonts to discuss the pain medications she is currently taking. Scheduled appointment for tomorrow at [facility].

## 2016-01-13 NOTE — Telephone Encounter (Signed)
I recommend she take senna s 2 tablets daily.  She may also use miralax short term on a daily basis until she is back to having regular bms, then change to PRN.

## 2016-01-14 ENCOUNTER — Non-Acute Institutional Stay: Payer: Medicare Other | Admitting: Internal Medicine

## 2016-01-14 ENCOUNTER — Encounter: Payer: Self-pay | Admitting: Internal Medicine

## 2016-01-14 VITALS — BP 110/70 | HR 62 | Temp 98.5°F | Wt 119.0 lb

## 2016-01-14 DIAGNOSIS — I495 Sick sinus syndrome: Secondary | ICD-10-CM | POA: Diagnosis not present

## 2016-01-14 DIAGNOSIS — K5903 Drug induced constipation: Secondary | ICD-10-CM | POA: Diagnosis not present

## 2016-01-14 DIAGNOSIS — I2510 Atherosclerotic heart disease of native coronary artery without angina pectoris: Secondary | ICD-10-CM | POA: Diagnosis not present

## 2016-01-14 DIAGNOSIS — M545 Low back pain, unspecified: Secondary | ICD-10-CM

## 2016-01-14 DIAGNOSIS — M810 Age-related osteoporosis without current pathological fracture: Secondary | ICD-10-CM

## 2016-01-14 DIAGNOSIS — G8929 Other chronic pain: Secondary | ICD-10-CM | POA: Diagnosis not present

## 2016-01-14 NOTE — Progress Notes (Addendum)
Location:  [facility] clinic Provider: Maysa Lynn L. Mariea Clonts, D.O., C.M.D.  Code Status: DNR Goals of Care:  Advanced Directives 01/08/2016  Does Patient Have a Medical Advance Directive? Yes  Type of Paramedic of [place];Living will  Does patient want to make changes to medical advance directive? No - Patient declined  Copy of [place] in Chart? No - copy requested  Would patient like information on creating a medical advance directive? -  Pre-existing out of facility DNR order (yellow form or pink MOST form) -   Chief Complaint  Patient presents with  . Acute Visit    discuss pain meds, constipation    HPI: Patient is a 82 y.o. female seen today for an acute visit for discussing pain meds and constipation.  She was hospitalized for her ablation procedure.  She got really fouled up with her bowels.  She's gotten it going, but does not want it   Was using fibercon and stool softener and still had some constipation.    Eats greens, broccoli, veggies.  Starts with raisin bran with blueberries each am.  Admits she should drink more water.  Says miralax has never helped her.    Ankles are very swollen especially right.  Cardiology is adjusting her lasix--she does not notice much change.  Wearing compression hose.    Past Medical History:  Diagnosis Date  . Abnormal stress test    a. 11/2011 Ex MV: EF 80%, small, partially reversible anteroapical defect->mild ischemia vs attenuation-->med Rx.  [name] Kitchen Alopecia, unspecified   . Chronic diastolic CHF (congestive heart failure) ([place])    a. 11/2013 Echo: EF 60-65%, no rwma, mild TR, PASP 70mmHg.  . Closed fracture of lower end of radius with ulna   . Deafness    Left, s/p multiple surgeries  . Essential hypertension   . Gouty arthropathy, unspecified   . Hypothyroidism   . Insomnia, unspecified   . Macular degeneration    Left, s/p inj. tx  . Mitral valve disorders(424.0)   . Neck mass    right, w/u  including MRI negative  . PAF (paroxysmal atrial fibrillation) (HCC)    a. on amio (02/2012 mild obstruction on PFT's) and xarelto.  . Polycythemia vera(238.4)   . Pure hypercholesterolemia   . Senile osteoporosis    Reclast in the past  . Tricuspid valve disorders, specified as nonrheumatic     Past Surgical History:  Procedure Laterality Date  . ABLATION  08/28/14   Back   . BREAST BIOPSY  06/18/1998   left  . BREAST BIOPSY    . ELECTROPHYSIOLOGIC STUDY N/A 01/08/2016   Procedure: Atrial Fibrillation Ablation;  Surgeon: Thompson Grayer, MD;  Location: [place] [facility];  Service: Cardiovascular;  Laterality: N/A;  . FEMUR IM NAIL Right 10/08/2015   Procedure: INTRAMEDULLARY (IM) NAIL FEMORAL RIGHT;  Surgeon: Paralee Cancel, MD;  Location: [facility];  Service: Orthopedics;  Laterality: Right;  . TONSILLECTOMY    . TOTAL ABDOMINAL HYSTERECTOMY    . TYMPANOPLASTY Bilateral     Allergies  Allergen Reactions  . Amlodipine Swelling and Other (See Comments)    Reaction:  Lower extremity swelling       Medication List       Accurate as of 01/14/16  3:57 PM. Always use your most recent med list.          amiodarone 200 MG tablet Commonly known as:  PACERONE Take 100 mg by mouth daily.   cholecalciferol  1000 units tablet Commonly known as:  VITAMIN D Take 2,000 Units by mouth daily.   fentaNYL 25 MCG/HR patch Commonly known as:  DURAGESIC - dosed mcg/hr Place 1 patch (25 mcg total) onto the skin every 3 (three) days.   furosemide 40 MG tablet Commonly known as:  LASIX Take 2 tablets (80 mg) by mouth in the morning and take 1.5 tablets (60 mg) in the evening   gabapentin 100 MG capsule Commonly known as:  NEURONTIN Take 100 mg by mouth at bedtime.   hydrALAZINE 25 MG tablet Commonly known as:  APRESOLINE Take 1 tablet (25 mg total) by mouth 3 (three) times daily.   hydroxyurea 500 MG capsule Commonly known as:  HYDREA TAKE 2 CAPSULES ON MONDAYS AND TAKE 1 CAPSULE ALL  OTHER DAYS OF THE WEEK.   levothyroxine 112 MCG tablet Commonly known as:  SYNTHROID, LEVOTHROID Take 112 mcg by mouth daily before breakfast.   Magnesium 250 MG Tabs Take 250 mg by mouth daily.   Melatonin 10 MG Tabs Take 10 mg by mouth at bedtime.   pantoprazole 40 MG tablet Commonly known as:  PROTONIX Take 1 tablet (40 mg total) by mouth daily.   polycarbophil 625 MG tablet Commonly known as:  FIBERCON Take 2 tablets (1,250 mg total) by mouth 2 (two) times daily.   potassium chloride 10 MEQ tablet Commonly known as:  K-DUR,KLOR-CON Take 1 tablet (10 mEq total) by mouth 2 (two) times daily.   PRESERVISION AREDS 2 Caps Take 2 capsules by mouth daily.   Rivaroxaban 15 MG Tabs tablet Commonly known as:  XARELTO Take 15 mg by mouth daily with supper.   traMADol 50 MG tablet Commonly known as:  ULTRAM Take 50 mg by mouth at bedtime.       Review of Systems:  Review of Systems  Constitutional: Negative for chills, fever and malaise/fatigue.  HENT: Positive for hearing loss.   Eyes: Negative for blurred vision.  Respiratory: Negative for shortness of breath.   Cardiovascular: Negative for chest pain, palpitations and leg swelling.  Gastrointestinal: Positive for constipation. Negative for abdominal pain, blood in stool, diarrhea and melena.  Genitourinary: Negative for dysuria.  Musculoskeletal: Positive for back pain and joint pain. Negative for falls.  Skin: Negative for rash.  Neurological: Negative for dizziness, loss of consciousness and weakness.  Endo/Heme/Allergies: Bruises/bleeds easily.    Health Maintenance  Topic Date Due  . TETANUS/TDAP  08/03/1949  . INFLUENZA VACCINE  Completed  . DEXA SCAN  Completed  . ZOSTAVAX  Completed  . PNA vac Low Risk Adult  Completed    Physical Exam: Vitals:   01/14/16 1531  BP: 110/70  Pulse: 62  Temp: 98.5 F (36.9 C)  TempSrc: Oral  SpO2: 97%  Weight: 119 lb (54 kg)   Body mass index is 23.24  kg/m. Physical Exam  Constitutional: She is oriented to person, place, and time. She appears well-developed and well-nourished. No distress.  Cardiovascular: Normal rate, regular rhythm, normal heart sounds and intact distal pulses.   Pulmonary/Chest: Effort normal and breath sounds normal.  Abdominal: Soft. Bowel sounds are normal. She exhibits distension. She exhibits no mass. There is no tenderness. There is no rebound and no guarding. No hernia.  Musculoskeletal:  Walks with limp since right hip surgery; uses rolling walker, lightweight  Neurological: She is alert and oriented to person, place, and time.  Skin: Skin is warm and dry. Capillary refill takes less than 2 seconds.  Right medial thigh  with ecchymoses and swelling s/p catheterization procedure for ablation  Psychiatric: She has a normal mood and affect.    Labs reviewed: Basic Metabolic Panel:  Recent Labs  07/18/15 1506  12/22/15 1407 01/08/16 0546 01/09/16 0628  NA  --   < > 138 136 136  K  --   < > 4.2 4.0 3.9  CL  --   < > 97* 99* 101  CO2  --   < > 31 29 26   GLUCOSE  --   < > 82 87 95  BUN  --   < > 22 21* 12  CREATININE  --   < > 0.81 0.76 0.86  CALCIUM  --   < > 10.6* 10.8* 9.1  TSH 2.21  --   --   --   --   < > = values in this interval not displayed. Liver Function Tests:  Recent Labs  06/20/15 0903 10/06/15 0408  AST 17 18  ALT 11 18  ALKPHOS 72 49  BILITOT 0.7 0.6  PROT 6.2 6.5  ALBUMIN 4.6 4.3   No results for input(s): LIPASE, AMYLASE in the last 8760 hours. No results for input(s): AMMONIA in the last 8760 hours. CBC:  Recent Labs  11/03/15 1449 12/08/15 1447 12/22/15 1407 01/08/16 0546 01/09/16 0628  WBC 9.7 9.1 6.5 6.1 10.6*  NEUTROABS 7.3* 6.7* 4,550  --   --   HGB 13.9 14.1 13.3 12.8 11.2*  HCT 43.5 43.2 40.9 39.2 34.9*  MCV 114.6* 114.7* 113.6* 113.3* 115.9*  PLT 620* 338 388 340 254   Lipid Panel:  Recent Labs  07/29/15 1200  CHOL 162  HDL 101*  LDLCALC 42   TRIG 100   Ionized calcium normal again at 5.5 on 11/28  Procedures since last visit: Dg Chest 1 View  Result Date: 01/08/2016 CLINICAL DATA:  Dyspnea EXAM: CHEST 1 VIEW COMPARISON:  Chest CT 01/05/2016 FINDINGS: Cardiac silhouette is enlarged. There are diffuse bilateral interstitial opacities, likely indicating mild interstitial edema. No focal airspace consolidation. No pneumothorax or sizable pleural effusion. IMPRESSION: Cardiomegaly and mild interstitial edema. Electronically Signed   By: Ulyses Jarred M.D.   On: 01/08/2016 22:08   Ct Cardiac Morph/pulm Vein W/cm&w/o Ca Score  Addendum Date: 01/05/2016   ADDENDUM REPORT: 01/05/2016 13:01 CLINICAL DATA:  Pre Ablation EXAM: Cardiac CTA MEDICATIONS: None TECHNIQUE: The patient was scanned on a Philips [id number] slice scanner. Gantry rotation speed was 270 msecs. Collimation was .93mm. A 100 kV prospective scan was triggered in the descending thoracic aorta at 111 HU's with 5% padding centered around 78% of the R-R interval. Average HR during the scan was 70 bpm. The 3D data set was interpreted on a dedicated work station using MPR, MIP and VRT modes. A total of 80cc of contrast was used. FINDINGS: Non-cardiac: See separate report from [facility]. Old granulomatous disease on limited lung and soft tissue windows. There was mild LAE and moderate RAE. The RV appeared dilated as was the main pulmonary artery at 3.9 cm. The ascending aortic root was dilated at 3.8 cm. The LAA had a cauliflower appearance with no LAA thrombus. The esophagus coursed closest to the LLPV orifice There was no ASD/VSD. There was no pericardial effusion. The pulmonary veins entered the LA with no anomalies. See measurements below LUPV:  Ostium 14.6 mm Area 1.5 cm2 LLPV:  Ostium 15.4 mm Area 1.9 cm2 RUPV:  Ostium 18.5 mm Area 2.6 cm2 RLPV:  Ostium 19 mm Area 2.9  cm2 IMPRESSION: 1) No LAA thrombus 2) Moderate RAE mild LAE 3) RV and mPA appear dilated 4) Mild aortic root  enlargement 3.8 cm 5) No ASD/PFO 6) Normal pulmonary veins with no anomaly see measurements above 7) Esophagus courses closest to the [place] Electronically Signed   By: Jenkins Rouge M.D.   On: 01/05/2016 13:01   Result Date: 01/05/2016 EXAM: OVER-READ INTERPRETATION  CT CHEST The following report is an over-read performed by radiologist Dr. Estanislado Pandy [facility] on 01/05/2016. This over-read does not include interpretation of cardiac or coronary anatomy or pathology. The coronary calcium score/coronary CTA interpretation by the cardiologist is attached. COMPARISON:  Chest radiograph 09/01/2014 FINDINGS: Cardiovascular: Mid ascending thoracic aorta measures up to 3.8 cm. There is no evidence to suggest an aortic dissection. Main pulmonary artery measures 3.7 cm. No evidence for a large central pulmonary embolism. No gross abnormality to left atrial appendage. Heart is large for size. The proximal celiac trunk and SMA are patent. Refer to the cardiology report for evaluation of the pulmonary veins. Mediastinum/Nodes: Calcifications in the left hilum are suggestive for old granulomatous disease. No significant chest lymphadenopathy. No significant pericardial fluid. Lungs/Pleura: Tiny nodular densities and tree-in-bud opacities in the posterior right upper lobe, best seen on sequence 204, image 8. Findings are suggestive for inflammatory changes of unknown age. There appears to be scarring or focal atelectasis in the medial right middle lobe. There is volume loss in left lower lobe. No significant pleural fluid. Few small nodules in the left upper lobe and lingular region. These small pulmonary nodules are less than 5 mm in size. Upper Abdomen: Punctate calcifications in the spleen compatible with old granulomatous disease. Fullness in left adrenal gland is nonspecific and could be related to hyperplasia. This area is incompletely evaluated. Musculoskeletal: The L1 vertebral body is only  partially visualized on the axial images but the topogram image demonstrates a severe compression deformity at L1. This is new from a lumbar spine MRI on 04/15/2014. IMPRESSION: Scattered punctate nodules and tree-in-bud opacities in the lungs, particularly in the right upper lobe. Findings are suggestive for an inflammatory or postinflammatory changes of unknown age. The nodular densities measure less than 5 mm. No follow-up needed if patient is low-risk (and has no known or suspected primary neoplasm). Non-contrast chest CT can be considered in 12 months if patient is high-risk. This recommendation follows the consensus statement: Guidelines for Management of Incidental Pulmonary Nodules Detected on CT Images: From the Fleischner Society 2017; Radiology 2017; 284:228-243. Evidence for old granulomatous disease. Fullness in the left adrenal gland may represent hyperplasia but incompletely evaluated. Volume loss in left lower lobe and right middle lobe. L1 compression fracture which is only partially visualized. This is new from a MRI on 04/15/2014. Electronically Signed: By: Markus Daft M.D. On: 01/05/2016 11:50    Assessment/Plan: 1. Constipation due to pain medication -advised she must hydrate better or her increased fiber is going to create a blockage instead of helping her to have a bm -also advised that if stool softeners, fluid, activity and fiber area all unsuccessful, then resort to miralax or when desperate, mag citrate but not to use it regularly  2. Senile osteoporosis -cont with prolia injections twice a year, vitamin D 2000 units daily, and weightbearing exercise, cannot take calcium due to elevated calcium levels intermittently previously worked up by endocrine before she moved to [place] (in [place])  3. Chronic bilateral low back pain without sciatica -ongoing, cont  fentanyl patch with tramadol at hs--only uses when she really needs it -hip has gotten much better, but still walks with a  limp using walker  4. Sick sinus syndrome [facility]) -s/p ablation, doing well so far, on amiodarone, xarelto, monitor and f/u with cardiology as planned  Labs/tests ordered:  No new Next appt:  03/17/2016  Lark Runk L. Annette Bertelson, D.O. [facility] [address], [place] [address] Cell Phone (Mon-Fri 8am-5pm):  [phone] On Call:  [phone] & follow prompts after 5pm & weekends Office Phone:  [phone] Office Fax:  [phone]

## 2016-01-15 ENCOUNTER — Ambulatory Visit (HOSPITAL_COMMUNITY)
Admission: RE | Admit: 2016-01-15 | Discharge: 2016-01-15 | Disposition: A | Discharge status: Home / Self Care | Payer: Medicare Other | Source: Ambulatory Visit | Attending: Nurse Practitioner | Admitting: Nurse Practitioner

## 2016-01-15 ENCOUNTER — Encounter (HOSPITAL_COMMUNITY): Payer: Self-pay | Admitting: Nurse Practitioner

## 2016-01-15 VITALS — BP 164/82 | HR 61 | Ht 60.0 in | Wt 117.0 lb

## 2016-01-15 DIAGNOSIS — E039 Hypothyroidism, unspecified: Secondary | ICD-10-CM | POA: Diagnosis not present

## 2016-01-15 DIAGNOSIS — I491 Atrial premature depolarization: Secondary | ICD-10-CM | POA: Diagnosis not present

## 2016-01-15 DIAGNOSIS — I110 Hypertensive heart disease with heart failure: Secondary | ICD-10-CM | POA: Diagnosis not present

## 2016-01-15 DIAGNOSIS — R609 Edema, unspecified: Secondary | ICD-10-CM | POA: Diagnosis not present

## 2016-01-15 DIAGNOSIS — I480 Paroxysmal atrial fibrillation: Secondary | ICD-10-CM | POA: Insufficient documentation

## 2016-01-15 DIAGNOSIS — M109 Gout, unspecified: Secondary | ICD-10-CM | POA: Insufficient documentation

## 2016-01-15 DIAGNOSIS — M25561 Pain in right knee: Secondary | ICD-10-CM | POA: Insufficient documentation

## 2016-01-15 DIAGNOSIS — Z9889 Other specified postprocedural states: Secondary | ICD-10-CM | POA: Insufficient documentation

## 2016-01-15 DIAGNOSIS — I5032 Chronic diastolic (congestive) heart failure: Secondary | ICD-10-CM | POA: Diagnosis not present

## 2016-01-15 DIAGNOSIS — H3530 Unspecified macular degeneration: Secondary | ICD-10-CM | POA: Diagnosis not present

## 2016-01-15 DIAGNOSIS — Z87891 Personal history of nicotine dependence: Secondary | ICD-10-CM | POA: Diagnosis not present

## 2016-01-15 DIAGNOSIS — E7800 Pure hypercholesterolemia, unspecified: Secondary | ICD-10-CM | POA: Insufficient documentation

## 2016-01-15 DIAGNOSIS — I4891 Unspecified atrial fibrillation: Secondary | ICD-10-CM | POA: Diagnosis present

## 2016-01-15 NOTE — Patient Instructions (Signed)
Your physician has recommended you make the following change in your medication:  1)keep lasix at 80mg  twice a day through Saturday evening

## 2016-01-15 NOTE — Progress Notes (Signed)
Primary Care Physician: Hollace Kinnier, DO Referring Physician: Self referred EP: Sarah Mays is a 82 y.o. female with a h/o afib that had a afib ablation 11/30 and is in the afib clinic for f/u of edema for lower extremities.  She states she had shortness of breath after the procedure and given 40 mg lasix IV prior to d/c. She believes that she did not have her usual am/pm lasix day of procedure.  Her weight at d/c was 119 lbs and is 118 in the clinic but her usual dry weight is 114 lbs. She has also a large bruise of her rt thigh, looks to be resolving and has had a few stabbing pains in her rt knee which is improved with tylenol today.  She is in SR and has not noted any afib. Continues on xarelto.  She returns 12/8 and is down one lb of fluid after increasing lasix to 80/80 bid from 80/60 bid. The bruising of her rt thigh has now migrated to mid line knee and calf area with less soreness anterior thigh but more soreness around the knee area. Less edema noted of the legs. Soreness at the groin area but no pain. NSR on ekg, no awareness of afib. She was having some constipation but this has improved.  Today, she denies symptoms of palpitations, chest pain,   orthopnea, PND,  dizziness, presyncope, syncope, or neurologic sequela. Positive for mild shortness of breath and lower extremity edema. Positive for soreness of rt inner knee area from where bruising has migrated .The patient is tolerating medications without difficulties and is otherwise without complaint today.   Past Medical History:  Diagnosis Date  . Abnormal stress test    a. 11/2011 Ex MV: EF 80%, small, partially reversible anteroapical defect->mild ischemia vs attenuation-->med Rx.  [name] Kitchen Alopecia, unspecified   . Chronic diastolic CHF (congestive heart failure) ([place])    a. 11/2013 Echo: EF 60-65%, no rwma, mild TR, PASP 61mmHg.  . Closed fracture of lower end of radius with ulna   . Deafness    Left, s/p  multiple surgeries  . Essential hypertension   . Gouty arthropathy, unspecified   . Hypothyroidism   . Insomnia, unspecified   . Macular degeneration    Left, s/p inj. tx  . Mitral valve disorders(424.0)   . Neck mass    right, w/u including MRI negative  . PAF (paroxysmal atrial fibrillation) (HCC)    a. on amio (02/2012 mild obstruction on PFT's) and xarelto.  . Polycythemia vera(238.4)   . Pure hypercholesterolemia   . Senile osteoporosis    Reclast in the past  . Tricuspid valve disorders, specified as nonrheumatic    Past Surgical History:  Procedure Laterality Date  . ABLATION  08/28/14   Back   . BREAST BIOPSY  06/18/1998   left  . BREAST BIOPSY    . ELECTROPHYSIOLOGIC STUDY N/A 01/08/2016   Procedure: Atrial Fibrillation Ablation;  Surgeon: Thompson Grayer, MD;  Location: [place] [facility];  Service: Cardiovascular;  Laterality: N/A;  . FEMUR IM NAIL Right 10/08/2015   Procedure: INTRAMEDULLARY (IM) NAIL FEMORAL RIGHT;  Surgeon: Paralee Cancel, MD;  Location: [facility];  Service: Orthopedics;  Laterality: Right;  . TONSILLECTOMY    . TOTAL ABDOMINAL HYSTERECTOMY    . TYMPANOPLASTY Bilateral     Current Outpatient Prescriptions  Medication Sig Dispense Refill  . amiodarone (PACERONE) 200 MG tablet Take 100 mg by mouth daily.    [name] Kitchen  cholecalciferol (VITAMIN D) 1000 UNITS tablet Take 2,000 Units by mouth daily.     . fentaNYL (DURAGESIC - DOSED MCG/HR) 25 MCG/HR patch Place 1 patch (25 mcg total) onto the skin every 3 (three) days. 10 patch 0  . furosemide (LASIX) 40 MG tablet Take 2 tablets (80 mg) by mouth in the morning and take 2 tablets in the evening    . gabapentin (NEURONTIN) 100 MG capsule Take 100 mg by mouth at bedtime.     . hydrALAZINE (APRESOLINE) 25 MG tablet Take 1 tablet (25 mg total) by mouth 3 (three) times daily. 270 tablet 1  . hydroxyurea (HYDREA) 500 MG capsule TAKE 2 CAPSULES ON MONDAYS AND TAKE 1 CAPSULE ALL OTHER DAYS OF THE WEEK. (Patient taking  differently: TAKE 2 CAPSULES ON Saturday and Sunday  AND TAKE 1 CAPSULE ALL OTHER DAYS OF THE WEEK.) 35 capsule 0  . levothyroxine (SYNTHROID, LEVOTHROID) 112 MCG tablet Take 112 mcg by mouth daily before breakfast.    . Magnesium 250 MG TABS Take 250 mg by mouth daily.     . Melatonin 10 MG TABS Take 10 mg by mouth at bedtime.    . Multiple Vitamins-Minerals (PRESERVISION AREDS 2) CAPS Take 2 capsules by mouth daily.     . pantoprazole (PROTONIX) 40 MG tablet Take 1 tablet (40 mg total) by mouth daily. 45 tablet 0  . polycarbophil (FIBERCON) 625 MG tablet Take 2 tablets (1,250 mg total) by mouth 2 (two) times daily. 60 tablet 0  . potassium chloride (K-DUR,KLOR-CON) 10 MEQ tablet Take 1 tablet (10 mEq total) by mouth 2 (two) times daily. 60 tablet 0  . Rivaroxaban (XARELTO) 15 MG TABS tablet Take 15 mg by mouth daily with supper.    . traMADol (ULTRAM) 50 MG tablet Take 50 mg by mouth at bedtime.     No current facility-administered medications for this encounter.     Allergies  Allergen Reactions  . Amlodipine Swelling and Other (See Comments)    Reaction:  Lower extremity swelling     Social History   Social History  . Marital status: Married    Spouse name: [name]  . Number of children: 2  . Years of education: 16   Occupational History  . Not on file.   Social History Main Topics  . Smoking status: Former Smoker    Types: Cigarettes    Quit date: 01/19/1984  . Smokeless tobacco: Never Used  . Alcohol use 0.6 oz/week    1 Glasses of wine per week     Comment: 2 per day/ 14 a week  . Drug use: No  . Sexual activity: Not Currently   Other Topics Concern  . Not on file   Social History Narrative   Patient is Married 1955. 2 kids. 50 grandkid-53 years old in 2016. College graduate [name]).    Lives in apartment,  Independent Living  section at [facility] since 02/2013.      Stay at home mother.       No Smoking history, Mod. alcohol use.    Patient has a living will, POA      Hobbies: CPU and painting             Family History  Problem Relation Age of Onset  . Hypertension Father   . Heart disease Father     patient does not know details.   . Ovarian cancer Mother 32  . Cancer Sister  colon  . Heart disease Sister   . Cancer Sister   . Cancer Sister     hodgkin disease  . Breast cancer Daughter   . Cancer Daughter     breast    ROS- All systems are reviewed and negative except as per the HPI above  Physical Exam: Vitals:   01/15/16 1338  BP: (!) 164/82  Pulse: 61  Weight: 117 lb (53.1 kg)  Height: 5' (1.524 m)   Wt Readings from Last 3 Encounters:  01/15/16 117 lb (53.1 kg)  01/14/16 119 lb (54 kg)  01/12/16 118 lb 3.2 oz (53.6 kg)    Labs: Lab Results  Component Value Date   NA 136 01/09/2016   K 3.9 01/09/2016   CL 101 01/09/2016   CO2 26 01/09/2016   GLUCOSE 95 01/09/2016   BUN 12 01/09/2016   CREATININE 0.86 01/09/2016   CALCIUM 9.1 01/09/2016   Lab Results  Component Value Date   INR 1.17 09/01/2014   Lab Results  Component Value Date   CHOL 162 07/29/2015   HDL 101 (A) 07/29/2015   LDLCALC 42 07/29/2015   TRIG 100 07/29/2015     GEN- The patient is well appearing, alert and oriented x 3 today.   Head- normocephalic, atraumatic Eyes-  Sclera clear, conjunctiva pink Ears- hearing intact Oropharynx- clear Neck- supple, no JVP Lymph- no cervical lymphadenopathy Lungs- Clear to ausculation bilaterally, normal work of breathing Heart- Regular rate and rhythm, no murmurs, rubs or gallops, PMI not laterally displaced GI- soft, NT, ND, + BS Extremities- no clubbing, cyanosis, 1+ edema left leg, 2+ rt leg(usually swells more on rt), purplish bruising pubic area and anterior rt thigh, clearing, now bruise around rt knee and upper leg.. No bruit. Sensation, color/warmth same in both legs. MS- no significant deformity or atrophy Skin- no rash or lesion Psych- euthymic mood,  full affect Neuro- strength and sensation are intact  EKG- NSR at 61 bpm, with first degree av block with PAC's Pr int 212 ms, qrs int 98 ms, qtc 448 ms Epic records reviewed   Assessment and Plan: 1. S/p afib ablation 11/30 Increase in LEE since procedure  Improved by one lb Continue lasix  80 mg bid  until Saturday, the return to usual dose Call on Monday if fluid status is not  Back to baseline  2. Rt thigh/knee pain Improving bruise of anterior thigh but now bruise migrated to rt knee Tylenol as needed Take it easy with activities Should improve with rest and over the next week Groin is sore but no pain F/u in afib clinc 12/28  Sarah Mays, [facility] [address] [place], [place] [address] [phone]

## 2016-01-19 ENCOUNTER — Telehealth (HOSPITAL_COMMUNITY): Payer: Self-pay | Admitting: *Deleted

## 2016-01-19 NOTE — Telephone Encounter (Signed)
Patient called in with report of continued swelling noted. Pt does not have access to a scale but "can tell shes still fluid overloaded." States swelling is no worse nor is shortness of breath but also no better. Pt continues to complain of bruising to leg from ablation and intermittent pain. Discussed with Roderic Palau NP will take lasix 80mg  BID today and reassess tomorrow. Pt verbalized understanding.

## 2016-01-20 ENCOUNTER — Encounter (HOSPITAL_COMMUNITY): Payer: Self-pay | Admitting: Nurse Practitioner

## 2016-01-20 ENCOUNTER — Ambulatory Visit (HOSPITAL_COMMUNITY)
Admission: RE | Admit: 2016-01-20 | Discharge: 2016-01-20 | Disposition: A | Discharge status: Home / Self Care | Payer: Medicare Other | Source: Ambulatory Visit | Attending: Nurse Practitioner | Admitting: Nurse Practitioner

## 2016-01-20 VITALS — BP 126/78 | HR 102 | Ht 60.0 in | Wt 118.0 lb

## 2016-01-20 DIAGNOSIS — E7800 Pure hypercholesterolemia, unspecified: Secondary | ICD-10-CM | POA: Diagnosis not present

## 2016-01-20 DIAGNOSIS — Z9889 Other specified postprocedural states: Secondary | ICD-10-CM | POA: Diagnosis not present

## 2016-01-20 DIAGNOSIS — E039 Hypothyroidism, unspecified: Secondary | ICD-10-CM | POA: Insufficient documentation

## 2016-01-20 DIAGNOSIS — M109 Gout, unspecified: Secondary | ICD-10-CM | POA: Insufficient documentation

## 2016-01-20 DIAGNOSIS — I110 Hypertensive heart disease with heart failure: Secondary | ICD-10-CM | POA: Diagnosis not present

## 2016-01-20 DIAGNOSIS — I5032 Chronic diastolic (congestive) heart failure: Secondary | ICD-10-CM | POA: Diagnosis not present

## 2016-01-20 DIAGNOSIS — H3530 Unspecified macular degeneration: Secondary | ICD-10-CM | POA: Diagnosis not present

## 2016-01-20 DIAGNOSIS — Z87891 Personal history of nicotine dependence: Secondary | ICD-10-CM | POA: Insufficient documentation

## 2016-01-20 DIAGNOSIS — H9190 Unspecified hearing loss, unspecified ear: Secondary | ICD-10-CM | POA: Insufficient documentation

## 2016-01-20 DIAGNOSIS — I480 Paroxysmal atrial fibrillation: Secondary | ICD-10-CM | POA: Diagnosis not present

## 2016-01-20 DIAGNOSIS — R609 Edema, unspecified: Secondary | ICD-10-CM

## 2016-01-20 DIAGNOSIS — I4891 Unspecified atrial fibrillation: Secondary | ICD-10-CM | POA: Diagnosis present

## 2016-01-20 LAB — BASIC METABOLIC PANEL
ANION GAP: 7 (ref 5–15)
BUN: 26 mg/dL — ABNORMAL HIGH (ref 6–20)
CALCIUM: 9.9 mg/dL (ref 8.9–10.3)
CHLORIDE: 98 mmol/L — AB (ref 101–111)
CO2: 31 mmol/L (ref 22–32)
CREATININE: 0.98 mg/dL (ref 0.44–1.00)
GFR calc Af Amer: >60 mL/min (ref >60)
GFR calc non Af Amer: 52 mL/min — ABNORMAL LOW (ref >60)
GLUCOSE: 92 mg/dL (ref 65–99)
Potassium: 4 mmol/L (ref 3.5–5.1)
Sodium: 136 mmol/L (ref 135–145)

## 2016-01-20 NOTE — Progress Notes (Signed)
Primary Care Physician: Hollace Kinnier, DO Referring Physician: Self referred EP: Dr. Molinda Bailiff is a 82 y.o. female with a h/o afib that had a afib ablation 11/30 and is in the afib clinic for f/u of edema for lower extremities.  She states she had shortness of breath after the procedure and given 40 mg lasix IV prior to d/c. She believes that she did not have her usual am/pm lasix day of procedure.  Her weight at d/c was 119 lbs and is 118 in the clinic but her usual dry weight is 114 lbs. She has also a large bruise of her rt thigh, looks to be resolving and has had a few stabbing pains in her rt knee which is improved with tylenol today.  She is in SR and has not noted any afib. Continues on xarelto.  She returns 12/8 and is down one lb of fluid after increasing lasix to 80/80 bid from 80/60 bid. The bruising of her rt thigh has now migrated to mid line knee and calf area with less soreness anterior thigh but more soreness around the knee area. Less edema noted of the legs. Soreness at the groin area but no pain. NSR on ekg, no awareness of afib. She was having some constipation but this has improved.  Returns to afib clinic 12/12 for c/o weight not back to baseline since ablation despite increase of lasix to 80 mg bid x 6 days and rt leg discomfort. Large hematoma seen after procedure around rt groin has moved to lower knee and foot area and is c/o of soreness in inner thigh area and tenderness of foot with bending of rt foot with more swelling with migration of bruise. Less swelling noted of left leg area and her rings are loose. Despite this, she still feels swollen in belly and weight is still at 118 lb, normal weight is around 114 lb, shortness of breath is at baseline.  Today, she denies symptoms of palpitations, chest pain,  orthopnea, PND,  dizziness, presyncope, syncope, or neurologic sequela. Positive for mild shortness of breath at baseline and lower extremity edema,  improved on left, worse on right due to bruising. Positive for soreness of rt inner knee area from where bruising has migrated .The patient is tolerating medications without difficulties and is otherwise without complaint today.   Past Medical History:  Diagnosis Date  . Abnormal stress test    a. 11/2011 Ex MV: EF 80%, small, partially reversible anteroapical defect->mild ischemia vs attenuation-->med Rx.  [name] Kitchen Alopecia, unspecified   . Chronic diastolic CHF (congestive heart failure) ([place])    a. 11/2013 Echo: EF 60-65%, no rwma, mild TR, PASP 33mmHg.  . Closed fracture of lower end of radius with ulna   . Deafness    Left, s/p multiple surgeries  . Essential hypertension   . Gouty arthropathy, unspecified   . Hypothyroidism   . Insomnia, unspecified   . Macular degeneration    Left, s/p inj. tx  . Mitral valve disorders(424.0)   . Neck mass    right, w/u including MRI negative  . PAF (paroxysmal atrial fibrillation) ([facility])    a. on amio (02/2012 mild obstruction on PFT's) and xarelto.  . Polycythemia vera(238.4)   . Pure hypercholesterolemia   . Senile osteoporosis    Reclast in the past  . Tricuspid valve disorders, specified as nonrheumatic    Past Surgical History:  Procedure Laterality Date  . ABLATION  08/28/14   Back   .  BREAST BIOPSY  06/18/1998   left  . BREAST BIOPSY    . ELECTROPHYSIOLOGIC STUDY N/A 01/08/2016   Procedure: Atrial Fibrillation Ablation;  Surgeon: Thompson Grayer, MD;  Location: [facility];  Service: Cardiovascular;  Laterality: N/A;  . FEMUR IM NAIL Right 10/08/2015   Procedure: INTRAMEDULLARY (IM) NAIL FEMORAL RIGHT;  Surgeon: Paralee Cancel, MD;  Location: [facility];  Service: Orthopedics;  Laterality: Right;  . TONSILLECTOMY    . TOTAL ABDOMINAL HYSTERECTOMY    . TYMPANOPLASTY Bilateral     Current Outpatient Prescriptions  Medication Sig Dispense Refill  . amiodarone (PACERONE) 200 MG tablet Take 100 mg by mouth daily.    . cholecalciferol  (VITAMIN D) 1000 UNITS tablet Take 2,000 Units by mouth daily.     . fentaNYL (DURAGESIC - DOSED MCG/HR) 25 MCG/HR patch Place 1 patch (25 mcg total) onto the skin every 3 (three) days. 10 patch 0  . furosemide (LASIX) 40 MG tablet Take 2 tablets (80 mg) by mouth in the morning and take 2 tablets in the evening    . gabapentin (NEURONTIN) 100 MG capsule Take 100 mg by mouth at bedtime.     . hydrALAZINE (APRESOLINE) 25 MG tablet Take 1 tablet (25 mg total) by mouth 3 (three) times daily. 270 tablet 1  . hydroxyurea (HYDREA) 500 MG capsule TAKE 2 CAPSULES ON MONDAYS AND TAKE 1 CAPSULE ALL OTHER DAYS OF THE WEEK. (Patient taking differently: TAKE 2 CAPSULES ON Saturday and Sunday  AND TAKE 1 CAPSULE ALL OTHER DAYS OF THE WEEK.) 35 capsule 0  . levothyroxine (SYNTHROID, LEVOTHROID) 112 MCG tablet Take 112 mcg by mouth daily before breakfast.    . Magnesium 250 MG TABS Take 250 mg by mouth daily.     . Melatonin 10 MG TABS Take 10 mg by mouth at bedtime.    . Multiple Vitamins-Minerals (PRESERVISION AREDS 2) CAPS Take 2 capsules by mouth daily.     . pantoprazole (PROTONIX) 40 MG tablet Take 1 tablet (40 mg total) by mouth daily. 45 tablet 0  . polycarbophil (FIBERCON) 625 MG tablet Take 2 tablets (1,250 mg total) by mouth 2 (two) times daily. 60 tablet 0  . potassium chloride (K-DUR,KLOR-CON) 10 MEQ tablet Take 1 tablet (10 mEq total) by mouth 2 (two) times daily. 60 tablet 0  . Rivaroxaban (XARELTO) 15 MG TABS tablet Take 15 mg by mouth daily with supper.    . traMADol (ULTRAM) 50 MG tablet Take 50 mg by mouth at bedtime.     No current facility-administered medications for this encounter.     Allergies  Allergen Reactions  . Amlodipine Swelling and Other (See Comments)    Reaction:  Lower extremity swelling     Social History   Social History  . Marital status: Married    Spouse name: [name]  . Number of children: 2  . Years of education: 16   Occupational History  . Not on file.    Social History Main Topics  . Smoking status: Former Smoker    Types: Cigarettes    Quit date: 01/19/1984  . Smokeless tobacco: Never Used  . Alcohol use 0.6 oz/week    1 Glasses of wine per week     Comment: 2 per day/ 14 a week  . Drug use: No  . Sexual activity: Not Currently   Other Topics Concern  . Not on file   Social History Narrative   Patient is Married 1955. 2 kids. 1 grandkid-27  years old in 2016. College graduate [name] [place]).    Lives in apartment,  Independent Living  section at [facility] since 02/2013.      Stay at home mother.       No Smoking history, Mod. alcohol use.   Patient has a living will, POA      Hobbies: CPU and painting             Family History  Problem Relation Age of Onset  . Hypertension Father   . Heart disease Father     patient does not know details.   . Ovarian cancer Mother 65  . Cancer Sister     colon  . Heart disease Sister   . Cancer Sister   . Cancer Sister     hodgkin disease  . Breast cancer Daughter   . Cancer Daughter     breast    ROS- All systems are reviewed and negative except as per the HPI above  Physical Exam: Vitals:   01/20/16 1440  BP: 126/78  Pulse: (!) 102  SpO2: 96%  Weight: 118 lb (53.5 kg)  Height: 5' (1.524 m)   Wt Readings from Last 3 Encounters:  01/20/16 118 lb (53.5 kg)  01/15/16 117 lb (53.1 kg)  01/14/16 119 lb (54 kg)    Labs: Lab Results  Component Value Date   NA 136 01/09/2016   K 3.9 01/09/2016   CL 101 01/09/2016   CO2 26 01/09/2016   GLUCOSE 95 01/09/2016   BUN 12 01/09/2016   CREATININE 0.86 01/09/2016   CALCIUM 9.1 01/09/2016   Lab Results  Component Value Date   INR 1.17 09/01/2014   Lab Results  Component Value Date   CHOL 162 07/29/2015   HDL 101 (A) 07/29/2015   LDLCALC 42 07/29/2015   TRIG 100 07/29/2015     GEN- The patient is well appearing, alert and oriented x 3 today.   Head- normocephalic, atraumatic Eyes-   Sclera clear, conjunctiva pink Ears- hearing intact Oropharynx- clear Neck- supple, no JVP Lymph- no cervical lymphadenopathy Lungs- Clear to ausculation bilaterally, normal work of breathing Heart- Regular rate and rhythm, no murmurs, rubs or gallops, PMI not laterally displaced GI- soft, NT, ND, + BS Extremities- no clubbing, cyanosis, 1+ edema left leg, 2+ rt leg(usually swells more on rt), purplish bruising pubic area and anterior rt thigh, clearing, now bruise around rt knee and upper leg.. No bruit. Sensation, color/warmth same in both legs. MS- no significant deformity or atrophy Skin- no rash or lesion Psych- euthymic mood, full affect Neuro- strength and sensation are intact  EKG- not done today Epic records reviewed   Assessment and Plan: 1. S/p afib ablation 11/30 Increase in LEE since procedure  Improved on left with increase of diuretics, but more swelling on rt  due to bruising Continue lasix  80 mg bid, bmet today   2. Rt thigh/knee pain Improving bruise of anterior thigh but now bruise migrated to rt knee and foot Dr. Rayann Heman in and examined leg and reassured bruise and discomfort  will improve with time Tylenol as needed Take it easy with activities Should improve with rest   over the next week Groin is sore but no pain  F/u in afib clinc 12/28 Dr. Aundra Dubin 1/10  Butch Penny C. Roshaun Pound, [facility] [address] [place], [place] [address] [phone]

## 2016-01-20 NOTE — Patient Instructions (Signed)
Your physician has recommended you make the following change in your medication:  1)Stay on lasix 80mg  twice a day until follow up with the heart failure clinic.

## 2016-01-21 ENCOUNTER — Telehealth: Payer: Self-pay

## 2016-01-21 ENCOUNTER — Other Ambulatory Visit: Payer: Self-pay | Admitting: *Deleted

## 2016-01-21 ENCOUNTER — Other Ambulatory Visit: Payer: Self-pay | Admitting: Internal Medicine

## 2016-01-21 MED ORDER — FENTANYL 25 MCG/HR TD PT72: 25.0000 ug | MEDICATED_PATCH | TRANSDERMAL | 0 refills | Status: DC

## 2016-01-21 MED ORDER — TRAMADOL HCL 50 MG PO TABS: 50.0000 mg | ORAL_TABLET | Freq: Every day | ORAL | 0 refills | Status: DC

## 2016-01-21 NOTE — Telephone Encounter (Signed)
Patient requested Rx. Patient also stated that Dr. Mariea Clonts told her to call when she ran low on Tramadol and she only has 2 left. Rx printed for pick up

## 2016-01-21 NOTE — Telephone Encounter (Signed)
Left voicemail for patient to pick up prescription.

## 2016-02-05 ENCOUNTER — Encounter (HOSPITAL_COMMUNITY): Payer: Self-pay | Admitting: Nurse Practitioner

## 2016-02-05 ENCOUNTER — Ambulatory Visit (HOSPITAL_COMMUNITY)
Admission: RE | Admit: 2016-02-05 | Discharge: 2016-02-05 | Disposition: A | Discharge status: Home / Self Care | Payer: Medicare Other | Source: Ambulatory Visit | Attending: Nurse Practitioner | Admitting: Nurse Practitioner

## 2016-02-05 VITALS — BP 130/68 | HR 45 | Ht 60.0 in | Wt 113.8 lb

## 2016-02-05 DIAGNOSIS — Z87891 Personal history of nicotine dependence: Secondary | ICD-10-CM | POA: Insufficient documentation

## 2016-02-05 DIAGNOSIS — I110 Hypertensive heart disease with heart failure: Secondary | ICD-10-CM | POA: Diagnosis not present

## 2016-02-05 DIAGNOSIS — G4700 Insomnia, unspecified: Secondary | ICD-10-CM | POA: Insufficient documentation

## 2016-02-05 DIAGNOSIS — E7800 Pure hypercholesterolemia, unspecified: Secondary | ICD-10-CM | POA: Diagnosis not present

## 2016-02-05 DIAGNOSIS — I517 Cardiomegaly: Secondary | ICD-10-CM | POA: Diagnosis not present

## 2016-02-05 DIAGNOSIS — I5032 Chronic diastolic (congestive) heart failure: Secondary | ICD-10-CM | POA: Insufficient documentation

## 2016-02-05 DIAGNOSIS — E039 Hypothyroidism, unspecified: Secondary | ICD-10-CM | POA: Diagnosis not present

## 2016-02-05 DIAGNOSIS — M109 Gout, unspecified: Secondary | ICD-10-CM | POA: Insufficient documentation

## 2016-02-05 DIAGNOSIS — H3530 Unspecified macular degeneration: Secondary | ICD-10-CM | POA: Insufficient documentation

## 2016-02-05 DIAGNOSIS — R001 Bradycardia, unspecified: Secondary | ICD-10-CM | POA: Insufficient documentation

## 2016-02-05 DIAGNOSIS — R9431 Abnormal electrocardiogram [ECG] [EKG]: Secondary | ICD-10-CM | POA: Diagnosis not present

## 2016-02-05 DIAGNOSIS — Z9889 Other specified postprocedural states: Secondary | ICD-10-CM | POA: Diagnosis not present

## 2016-02-05 DIAGNOSIS — H9190 Unspecified hearing loss, unspecified ear: Secondary | ICD-10-CM | POA: Diagnosis not present

## 2016-02-05 DIAGNOSIS — I480 Paroxysmal atrial fibrillation: Secondary | ICD-10-CM | POA: Insufficient documentation

## 2016-02-05 DIAGNOSIS — I4891 Unspecified atrial fibrillation: Secondary | ICD-10-CM | POA: Diagnosis present

## 2016-02-05 DIAGNOSIS — R609 Edema, unspecified: Secondary | ICD-10-CM | POA: Diagnosis not present

## 2016-02-05 LAB — BASIC METABOLIC PANEL
Anion gap: 10 (ref 5–15)
BUN: 17 mg/dL (ref 6–20)
CALCIUM: 10.1 mg/dL (ref 8.9–10.3)
CO2: 29 mmol/L (ref 22–32)
CREATININE: 0.76 mg/dL (ref 0.44–1.00)
Chloride: 98 mmol/L — ABNORMAL LOW (ref 101–111)
GLUCOSE: 91 mg/dL (ref 65–99)
Potassium: 3.7 mmol/L (ref 3.5–5.1)
Sodium: 137 mmol/L (ref 135–145)

## 2016-02-05 NOTE — Progress Notes (Signed)
Primary Care Physician: Hollace Kinnier, DO Referring Physician: Self referred EP: Dr. Molinda Bailiff is a 82 y.o. female with a h/o afib that had a afib ablation 11/30 and is in the afib clinic for f/u of edema for lower extremities.  She states she had shortness of breath after the procedure and given 40 mg lasix IV prior to d/c. She believes that she did not have her usual am/pm lasix day of procedure.  Her weight at d/c was 119 lbs and is 118 in the clinic but her usual dry weight is 114 lbs. She has also a large bruise of her rt thigh, looks to be resolving and has had a few stabbing pains in her rt knee which is improved with tylenol today.  She is in SR and has not noted any afib. Continues on xarelto.  She returns 12/8 and is down one lb of fluid after increasing lasix to 80/80 bid from 80/60 bid. The bruising of her rt thigh has now migrated to mid line knee and calf area with less soreness anterior thigh but more soreness around the knee area. Less edema noted of the legs. Soreness at the groin area but no pain. NSR on ekg, no awareness of afib. She was having some constipation but this has improved.  Returns to afib clinic 12/12 for c/o weight not back to baseline since ablation despite increase of lasix to 80 mg bid x 6 days and rt leg discomfort. Large hematoma seen after procedure around rt groin has moved to lower knee and foot area and is c/o of soreness in inner thigh area and tenderness of foot with bending of rt foot with more swelling with migration of bruise. Less swelling noted of left leg area and her rings are loose. Despite this, she still feels swollen in belly and weight is still at 118 lb, normal weight is around 114 lb, shortness of breath is at baseline.  F/u clinic 12/28, she is feeling stronger since ablation. Bruising has resolved except for a small amount in her rt foot, and small amount of swelling in her lateral rt ankle. Weight is down 5 lbs and she  continues on lasix 80 am and 80 mg pm. She is remaining in SR.   Today, she denies symptoms of palpitations, chest pain,  orthopnea, PND,  dizziness, presyncope, syncope, or neurologic sequela. Positive for mild shortness of breath at baseline and lower extremity edema, improved on left, worse on right due to bruising. Positive for soreness of rt inner knee area from where bruising has migrated .The patient is tolerating medications without difficulties and is otherwise without complaint today.   Past Medical History:  Diagnosis Date  . Abnormal stress test    a. 11/2011 Ex MV: EF 80%, small, partially reversible anteroapical defect->mild ischemia vs attenuation-->med Rx.  [name] Kitchen Alopecia, unspecified   . Chronic diastolic CHF (congestive heart failure) ([place] [facility])    a. 11/2013 Echo: EF 60-65%, no rwma, mild TR, PASP 15mmHg.  . Closed fracture of lower end of radius with ulna   . Deafness    Left, s/p multiple surgeries  . Essential hypertension   . Gouty arthropathy, unspecified   . Hypothyroidism   . Insomnia, unspecified   . Macular degeneration    Left, s/p inj. tx  . Mitral valve disorders(424.0)   . Neck mass    right, w/u including MRI negative  . PAF (paroxysmal atrial fibrillation) ([facility])    a. on amio (  02/2012 mild obstruction on PFT's) and xarelto.  . Polycythemia vera(238.4)   . Pure hypercholesterolemia   . Senile osteoporosis    Reclast in the past  . Tricuspid valve disorders, specified as nonrheumatic    Past Surgical History:  Procedure Laterality Date  . ABLATION  08/28/14   Back   . BREAST BIOPSY  06/18/1998   left  . BREAST BIOPSY    . ELECTROPHYSIOLOGIC STUDY N/A 01/08/2016   Procedure: Atrial Fibrillation Ablation;  Surgeon: Thompson Grayer, MD;  Location: [facility];  Service: Cardiovascular;  Laterality: N/A;  . FEMUR IM NAIL Right 10/08/2015   Procedure: INTRAMEDULLARY (IM) NAIL FEMORAL RIGHT;  Surgeon: Paralee Cancel, MD;  Location: [facility];  Service:  Orthopedics;  Laterality: Right;  . TONSILLECTOMY    . TOTAL ABDOMINAL HYSTERECTOMY    . TYMPANOPLASTY Bilateral     Current Outpatient Prescriptions  Medication Sig Dispense Refill  . amiodarone (PACERONE) 200 MG tablet Take 100 mg by mouth daily.    . cholecalciferol (VITAMIN D) 1000 UNITS tablet Take 2,000 Units by mouth daily.     . fentaNYL (DURAGESIC - DOSED MCG/HR) 25 MCG/HR patch Place 1 patch (25 mcg total) onto the skin every 3 (three) days. 10 patch 0  . furosemide (LASIX) 40 MG tablet Take 2 tablets (80 mg) by mouth in the morning and take 2 tablets in the evening    . gabapentin (NEURONTIN) 100 MG capsule Take 100 mg by mouth at bedtime.     . hydrALAZINE (APRESOLINE) 25 MG tablet Take 1 tablet (25 mg total) by mouth 3 (three) times daily. 270 tablet 1  . hydroxyurea (HYDREA) 500 MG capsule TAKE 2 CAPSULES ON MONDAYS AND TAKE 1 CAPSULE ALL OTHER DAYS OF THE WEEK. (Patient taking differently: TAKE 2 CAPSULES ON Saturday and Sunday  AND TAKE 1 CAPSULE ALL OTHER DAYS OF THE WEEK.) 35 capsule 0  . levothyroxine (SYNTHROID, LEVOTHROID) 112 MCG tablet Take 112 mcg by mouth daily before breakfast.    . Magnesium 250 MG TABS Take 250 mg by mouth daily.     . Melatonin 10 MG TABS Take 10 mg by mouth at bedtime.    . Multiple Vitamins-Minerals (PRESERVISION AREDS 2) CAPS Take 2 capsules by mouth daily.     . pantoprazole (PROTONIX) 40 MG tablet Take 1 tablet (40 mg total) by mouth daily. 45 tablet 0  . polycarbophil (FIBERCON) 625 MG tablet Take 2 tablets (1,250 mg total) by mouth 2 (two) times daily. 60 tablet 0  . potassium chloride (K-DUR) 10 MEQ tablet TAKE 1 TABLET TWICE DAILY. 60 tablet 5  . potassium chloride (K-DUR,KLOR-CON) 10 MEQ tablet Take 1 tablet (10 mEq total) by mouth 2 (two) times daily. 60 tablet 0  . Rivaroxaban (XARELTO) 15 MG TABS tablet Take 15 mg by mouth daily with supper.    . traMADol (ULTRAM) 50 MG tablet Take 1 tablet (50 mg total) by mouth at bedtime. 30 tablet  0   No current facility-administered medications for this encounter.     Allergies  Allergen Reactions  . Amlodipine Swelling and Other (See Comments)    Reaction:  Lower extremity swelling     Social History   Social History  . Marital status: Married    Spouse name: [name]  . Number of children: 2  . Years of education: 16   Occupational History  . Not on file.   Social History Main Topics  . Smoking status: Former Smoker  Types: Cigarettes    Quit date: 01/19/1984  . Smokeless tobacco: Never Used  . Alcohol use 0.6 oz/week    1 Glasses of wine per week     Comment: 2 per day/ 14 a week  . Drug use: No  . Sexual activity: Not Currently   Other Topics Concern  . Not on file   Social History Narrative   Patient is Married 1955. 2 kids. 31 grandkid-66 years old in 2016. College graduate [name] [place]).    Lives in apartment,  Independent Living  section at [place] since 02/2013.      Stay at home mother.       No Smoking history, Mod. alcohol use.   Patient has a living will, POA      Hobbies: CPU and painting             Family History  Problem Relation Age of Onset  . Hypertension Father   . Heart disease Father     patient does not know details.   . Ovarian cancer Mother 81  . Cancer Sister     colon  . Heart disease Sister   . Cancer Sister   . Cancer Sister     hodgkin disease  . Breast cancer Daughter   . Cancer Daughter     breast    ROS- All systems are reviewed and negative except as per the HPI above  Physical Exam: Vitals:   02/05/16 1359  BP: 130/68  Pulse: (!) 45  Weight: 113 lb 12.8 oz (51.6 kg)  Height: 5' (1.524 m)   Wt Readings from Last 3 Encounters:  02/05/16 113 lb 12.8 oz (51.6 kg)  01/20/16 118 lb (53.5 kg)  01/15/16 117 lb (53.1 kg)    Labs: Lab Results  Component Value Date   NA 137 02/05/2016   K 3.7 02/05/2016   CL 98 (L) 02/05/2016   CO2 29 02/05/2016   GLUCOSE 91 02/05/2016    BUN 17 02/05/2016   CREATININE 0.76 02/05/2016   CALCIUM 10.1 02/05/2016   Lab Results  Component Value Date   INR 1.17 09/01/2014   Lab Results  Component Value Date   CHOL 162 07/29/2015   HDL 101 (A) 07/29/2015   LDLCALC 42 07/29/2015   TRIG 100 07/29/2015     GEN- The patient is well appearing, alert and oriented x 3 today.   Head- normocephalic, atraumatic Eyes-  Sclera clear, conjunctiva pink Ears- hearing intact Oropharynx- clear Neck- supple, no JVP Lymph- no cervical lymphadenopathy Lungs- Clear to ausculation bilaterally, normal work of breathing Heart- Regular rate and rhythm, no murmurs, rubs or gallops, PMI not laterally displaced GI- soft, NT, ND, + BS Extremities- no clubbing, cyanosis, trace edema left leg,  Trace rt mostly rt lateral ankle,(usually swells more on rt), purplish bruising pubic area and anterior rt thigh, knee resolved. Small amount old bruising present foot area. No bruit. Sensation, color/warmth same in both legs. MS- no significant deformity or atrophy Skin- no rash or lesion Psych- euthymic mood, full affect Neuro- strength and sensation are intact  EKG- Sinus brady at 59 bpm, pr int 220 ms, qrs int 100 ms, qtc 477 ms Epic records reviewed   Assessment and Plan: 1. S/p afib ablation 11/30 LEE since procedure has almost resolved  Improved on left with increase of diuretics, but more swelling on rt  due to bruising, but resolving Continue lasix  80 mg bid, bmet today  2. Rt lower /  leg pain Improving with resolution of brusing  Dr. Aundra Dubin 1/10  Butch Penny C. Vamsi Apfel, [facility] [address] [place], [place] [address] [phone]

## 2016-02-06 ENCOUNTER — Other Ambulatory Visit (HOSPITAL_COMMUNITY): Payer: Self-pay | Admitting: *Deleted

## 2016-02-06 MED ORDER — FUROSEMIDE 40 MG PO TABS: 80.0000 mg | ORAL_TABLET | Freq: Two times a day (BID) | ORAL | 2 refills | Status: DC

## 2016-02-07 ENCOUNTER — Other Ambulatory Visit: Payer: Self-pay | Admitting: Hematology and Oncology

## 2016-02-10 ENCOUNTER — Other Ambulatory Visit: Payer: Self-pay | Admitting: Internal Medicine

## 2016-02-17 ENCOUNTER — Telehealth: Payer: Self-pay | Admitting: *Deleted

## 2016-02-17 NOTE — Telephone Encounter (Signed)
Patient called requesting to pick up her Rx for Fentanyl Patch there tomorrow from [facility]. Would you like for me to print here in office and you pick up or would you like to write it for patient to pick up. Please Advise.

## 2016-02-17 NOTE — Telephone Encounter (Signed)
I will write it out here and give to patient.

## 2016-02-18 ENCOUNTER — Ambulatory Visit (HOSPITAL_COMMUNITY)
Admission: RE | Admit: 2016-02-18 | Discharge: 2016-02-18 | Disposition: A | Discharge status: Home / Self Care | Payer: Medicare Other | Source: Ambulatory Visit | Attending: Cardiology | Admitting: Cardiology

## 2016-02-18 ENCOUNTER — Encounter (HOSPITAL_COMMUNITY): Payer: Self-pay

## 2016-02-18 ENCOUNTER — Other Ambulatory Visit: Payer: Self-pay | Admitting: *Deleted

## 2016-02-18 ENCOUNTER — Encounter (HOSPITAL_COMMUNITY): Payer: Medicare Other

## 2016-02-18 VITALS — BP 152/80 | HR 57 | Wt 113.0 lb

## 2016-02-18 DIAGNOSIS — Z8249 Family history of ischemic heart disease and other diseases of the circulatory system: Secondary | ICD-10-CM | POA: Insufficient documentation

## 2016-02-18 DIAGNOSIS — R001 Bradycardia, unspecified: Secondary | ICD-10-CM | POA: Insufficient documentation

## 2016-02-18 DIAGNOSIS — Z87891 Personal history of nicotine dependence: Secondary | ICD-10-CM | POA: Diagnosis not present

## 2016-02-18 DIAGNOSIS — Z950 Presence of cardiac pacemaker: Secondary | ICD-10-CM | POA: Insufficient documentation

## 2016-02-18 DIAGNOSIS — I5032 Chronic diastolic (congestive) heart failure: Secondary | ICD-10-CM | POA: Insufficient documentation

## 2016-02-18 DIAGNOSIS — I110 Hypertensive heart disease with heart failure: Secondary | ICD-10-CM | POA: Insufficient documentation

## 2016-02-18 DIAGNOSIS — Z9889 Other specified postprocedural states: Secondary | ICD-10-CM | POA: Diagnosis not present

## 2016-02-18 DIAGNOSIS — E039 Hypothyroidism, unspecified: Secondary | ICD-10-CM | POA: Diagnosis not present

## 2016-02-18 DIAGNOSIS — D45 Polycythemia vera: Secondary | ICD-10-CM | POA: Insufficient documentation

## 2016-02-18 DIAGNOSIS — I480 Paroxysmal atrial fibrillation: Secondary | ICD-10-CM

## 2016-02-18 DIAGNOSIS — H3530 Unspecified macular degeneration: Secondary | ICD-10-CM | POA: Insufficient documentation

## 2016-02-18 LAB — COMPREHENSIVE METABOLIC PANEL
ALBUMIN: 4.7 g/dL (ref 3.5–5.0)
ALT: 14 U/L (ref 14–54)
ANION GAP: 10 (ref 5–15)
AST: 18 U/L (ref 15–41)
Alkaline Phosphatase: 49 U/L (ref 38–126)
BUN: 16 mg/dL (ref 6–20)
CO2: 31 mmol/L (ref 22–32)
Calcium: 10.7 mg/dL — ABNORMAL HIGH (ref 8.9–10.3)
Chloride: 96 mmol/L — ABNORMAL LOW (ref 101–111)
Creatinine, Ser: 0.86 mg/dL (ref 0.44–1.00)
GFR calc Af Amer: >60 mL/min (ref >60)
GFR calc non Af Amer: >60 mL/min (ref >60)
GLUCOSE: 94 mg/dL (ref 65–99)
POTASSIUM: 3.8 mmol/L (ref 3.5–5.1)
SODIUM: 137 mmol/L (ref 135–145)
TOTAL PROTEIN: 6.7 g/dL (ref 6.5–8.1)
Total Bilirubin: 0.9 mg/dL (ref 0.3–1.2)

## 2016-02-18 LAB — TSH: TSH: 1.232 u[IU]/mL (ref 0.350–4.500)

## 2016-02-18 MED ORDER — TRAMADOL HCL 50 MG PO TABS: 50.0000 mg | ORAL_TABLET | Freq: Every day | ORAL | 0 refills | Status: DC

## 2016-02-18 MED ORDER — FENTANYL 25 MCG/HR TD PT72: 25.0000 ug | MEDICATED_PATCH | TRANSDERMAL | 0 refills | Status: DC

## 2016-02-18 MED ORDER — HYDRALAZINE HCL 50 MG PO TABS
50.0000 mg | ORAL_TABLET | Freq: Three times a day (TID) | ORAL | 1 refills | Status: DC
Start: 2016-02-18 — End: 2016-02-18

## 2016-02-18 MED ORDER — HYDRALAZINE HCL 50 MG PO TABS: 50.0000 mg | ORAL_TABLET | Freq: Three times a day (TID) | ORAL | 1 refills | Status: DC

## 2016-02-18 NOTE — Patient Instructions (Signed)
Increase Hydralazine to 50 mg Three times a day   Labs today  Your physician has recommended that you wear a holter monitor. Holter monitors are medical devices that record the heart's electrical activity. Doctors most often use these monitors to diagnose arrhythmias. Arrhythmias are problems with the speed or rhythm of the heartbeat. The monitor is a small, portable device. You can wear one while you do your normal daily activities. This is usually used to diagnose what is causing palpitations/syncope (passing out).  We will contact you in 3 months to schedule your next appointment.

## 2016-02-18 NOTE — Telephone Encounter (Signed)
Rx printed for [facility] to take to [facility] for patient to pick up

## 2016-02-19 ENCOUNTER — Ambulatory Visit (INDEPENDENT_AMBULATORY_CARE_PROVIDER_SITE_OTHER): Payer: Medicare Other

## 2016-02-19 DIAGNOSIS — I480 Paroxysmal atrial fibrillation: Secondary | ICD-10-CM | POA: Diagnosis not present

## 2016-02-19 NOTE — Progress Notes (Signed)
Patient ID: Sarah Mays, female   DOB: [birth year], 83 y.o.   MRN: [id number] PCP: Dr Hollace Kinnier Cardiology: Dr. Aundra Dubin  83 yo with paroxysmal atrial fibrillation and chronic diastolic CHF returns for cardiology evaluation.  She is on amiodarone to try to maintain NSR.  She is tolerating Xarelto without melena or BRBPR.  In 7/16, she was admitted with chest pain.  Cardiolite showed no ischemia or infarction.  Echo in 6/17 showed EF 60-65%.  She developed problems elevated HR when in atrial fibrillation but bradycardia when in NSR.  She underwent atrial fibrillation ablation in 11/17.  She does not feels palpitations.  Today, ECG shows run of atrial fibrillation ending in NSR.  She is short of breath with inclines, ok on flat ground.  No chest pain.  Using a walker because of back pain.  Weight down 2 lbs.  No melena/BRBPR.   Labs (7/13): K 4.7, creatinine 0.56 Labs (8/13): K 5.4, creatinine 0.69 Labs (11/13): TSH 6.45, K 5.4=>3.8, creatinine 1.5=>1.2, BUN 70=>35, HCT 33.2, BNP 866, AST 25, ALT 36 Labs (12/13): K 3.5 => 2.8, creatinine 1.2 => 1.33, BUN 56 Labs (1/14): LDL 85, HDL 86, K 3.9, creatinine 1.0, BNP 447 Labs (4/14): K 4.1, creatinine 0.9, BNP 293, LFTs normal, TSH normal Labs (8/14): K 4.3, creatinine 1.2, LFTs normal, TSH normal Labs (12/14): LFTs normal, TSH normal Labs (3/15): K 4.5, creatinine 1.3, HCT 36.2 Labs (10/15): K 5.1, creatinine 0.9, LFTs normal, TSH normal, pBNP 1893 Labs (3/16): K 3.9, creatinine 1.03, HCT 36.9, LFTs normal Labs (7/16): K 4, creatinine 0.94, calcium 10.8, LFTs normal Labs (9/16): K 4, creatinine 0.9, LDL 69, TSH normal, HCT 42.3 Labs (5/17): K 4.6, creatinine 0.94, HCT 41.5, LFTs normal. Labs (6/17): K 5, creatinine 1.04, LDL 42, HDL 101, TSH normal Labs (12/17): K 3.7, creatinine 0.76, hgb 11.2  ECG: atrial fibrillation rate 63 with 2 sinus beats at end of ECG  PMH: 1. Atrial fibrillation: Diagnosed initially in 10/13.  Holter monitor in  11/13 showed atrial fibrillation with average rate 65.  - Atrial fibrillation ablation in 11/17.  2. Chronic diastolic CHF: Echo ([id number]) with EF 65-70%, mild MR, moderate biatrial enlargement, moderate TR, PA systolic pressure 45 mmHg.   Echo (10/15): EF 60-65%.  Echo (7/16) with EF 60-65%, mild MR.  - Echo (6/17): EF 60-65%, PASP 41 mmHg.  3. ETT-Sestamibi (11/13): Exercised to stage II, small partially reversible anteroapical perfusion defect suggesting mild ischemia versus attenuation, EF 80%.  Cardiolite (7/16) with EF 64%, no ischemia or infarction.  4. HTN: Lower extremity swelling with amlodipine.  5. Polycythemia vera: Has had phlebotomy only once.  6. Hypothyroidism 7. Macular degeneration 8. TAH 1980 9. Familial hypocalciuric hypercalcemia 10. PFTs (amiodarone use) in 1/14 showed a mild obstructive defect.  11. Degenerative disc disease  SH: Prior smoker (years ago).  Married, lived in [place] but now at [place]  Occasional ETOH.  Daughter works for [employer].   FH: CAD  ROS: All systems reviewed and negative except as per HPI.   Current Outpatient Prescriptions  Medication Sig Dispense Refill  . amiodarone (PACERONE) 200 MG tablet Take 100 mg by mouth daily.    . cholecalciferol (VITAMIN D) 1000 UNITS tablet Take 2,000 Units by mouth daily.     . fentaNYL (DURAGESIC - DOSED MCG/HR) 25 MCG/HR patch Place 1 patch (25 mcg total) onto the skin every 3 (three) days. 10 patch 0  . furosemide (LASIX) 40 MG tablet Take 2 tablets (80  mg total) by mouth 2 (two) times daily. 360 tablet 2  . gabapentin (NEURONTIN) 100 MG capsule Take 100 mg by mouth at bedtime.     . hydroxyurea (HYDREA) 500 MG capsule TAKE 2 CAPSULES ON MONDAYS AND TAKE 1 CAPSULE ALL OTHER DAYS OF THE WEEK. 35 capsule 0  . levothyroxine (SYNTHROID, LEVOTHROID) 112 MCG tablet TAKE 1 TABLET EACH DAY. 30 tablet 2  . Magnesium 250 MG TABS Take 250 mg by mouth daily.     . Melatonin 10 MG TABS Take 10 mg by mouth at  bedtime.    . Multiple Vitamins-Minerals (PRESERVISION AREDS 2) CAPS Take 2 capsules by mouth daily.     . pantoprazole (PROTONIX) 40 MG tablet Take 1 tablet (40 mg total) by mouth daily. 45 tablet 0  . polycarbophil (FIBERCON) 625 MG tablet Take 2 tablets (1,250 mg total) by mouth 2 (two) times daily. 60 tablet 0  . potassium chloride (K-DUR,KLOR-CON) 10 MEQ tablet Take 1 tablet (10 mEq total) by mouth 2 (two) times daily. 60 tablet 0  . Rivaroxaban (XARELTO) 15 MG TABS tablet Take 15 mg by mouth daily with supper.    . traMADol (ULTRAM) 50 MG tablet Take 1 tablet (50 mg total) by mouth at bedtime. 30 tablet 0  . hydrALAZINE (APRESOLINE) 50 MG tablet Take 1 tablet (50 mg total) by mouth 3 (three) times daily. 270 tablet 1   No current facility-administered medications for this encounter.     BP (!) 152/80   Pulse (!) 57   Wt 113 lb (51.3 kg)   SpO2 97%   BMI 22.07 kg/m  General: NAD Neck: JVP 7 cm, no thyromegaly or thyroid nodule.  Lungs: CTAB CV: Nondisplaced PMI.  Heart regular S1/S2, no S3/S4, 1/6 early SEM.  No edema.  No carotid bruit.  Abdomen: Soft, nontender, no hepatosplenomegaly, no distention.  Neurologic: Alert and oriented x 3.  Psych: Normal affect. Extremities: No clubbing or cyanosis.   Assessment/Plan:  1. Atrial fibrillation: First noted in 10/13.  Atrial fibrillation has triggered acute on chronic diastolic CHF in the past.  CHADSVASC score is 40 (age, female gender, HTN, CHF).  She is anticoagulated with Xarelto 15 mg daily which is appropriate for her creatinine clearance.  She had atrial fibrillation ablation in 11/17, seems to have mainly been in NSR since then but run of atrial fibrillation noted today on ECG (ended in NSR).   - Will get Holter monitor to assess burden of atrial fibrillation.   - Continue amiodarone 100 mg daily.  Check LFTs/TSH today.  Given amiodarone use, she will need yearly eye exams.  Baseline PFTs showed a mild obstructive defect.   2.  CAD: No chest pain.  7/16 Cardiolite showed no ischemia or infarction.  3. Chronic diastolic CHF:  NYHA class II symptoms currently. Volume status improved on current Lasix dosing. - Continue Lasix 80 mg bid.  - BMET today.  4. HTN: BP elevated, increase hydralazine to 50 mg tid.    5. Bradycardia: HR ok today. As above, getting holter.    6. Hypercalcemia: Mild.  Has diagnosis of familial hypocalciuric hypercalcemia.   Followup 3 months.   Loralie Champagne 02/19/2016

## 2016-03-01 DIAGNOSIS — M47816 Spondylosis without myelopathy or radiculopathy, lumbar region: Secondary | ICD-10-CM | POA: Diagnosis not present

## 2016-03-02 DIAGNOSIS — H353221 Exudative age-related macular degeneration, left eye, with active choroidal neovascularization: Secondary | ICD-10-CM | POA: Diagnosis not present

## 2016-03-04 ENCOUNTER — Ambulatory Visit (HOSPITAL_BASED_OUTPATIENT_CLINIC_OR_DEPARTMENT_OTHER): Payer: Medicare Other | Admitting: Hematology and Oncology

## 2016-03-04 ENCOUNTER — Telehealth: Payer: Self-pay | Admitting: Hematology and Oncology

## 2016-03-04 ENCOUNTER — Other Ambulatory Visit (HOSPITAL_BASED_OUTPATIENT_CLINIC_OR_DEPARTMENT_OTHER): Payer: Medicare Other

## 2016-03-04 ENCOUNTER — Encounter: Payer: Self-pay | Admitting: Hematology and Oncology

## 2016-03-04 VITALS — BP 157/74 | HR 86 | Temp 98.5°F | Resp 18 | Ht 60.0 in | Wt 113.4 lb

## 2016-03-04 DIAGNOSIS — D45 Polycythemia vera: Secondary | ICD-10-CM

## 2016-03-04 DIAGNOSIS — I10 Essential (primary) hypertension: Secondary | ICD-10-CM

## 2016-03-04 LAB — CBC WITH DIFFERENTIAL/PLATELET
BASO%: 0.9 % (ref 0.0–2.0)
BASOS ABS: 0.1 10*3/uL (ref 0.0–0.1)
EOS%: 1.1 % (ref 0.0–7.0)
Eosinophils Absolute: 0.1 10*3/uL (ref 0.0–0.5)
HCT: 39.9 % (ref 34.8–46.6)
HGB: 13.2 g/dL (ref 11.6–15.9)
LYMPH%: 21.9 % (ref 14.0–49.7)
MCH: 38.7 pg — AB (ref 25.1–34.0)
MCHC: 33.1 g/dL (ref 31.5–36.0)
MCV: 117 fL — ABNORMAL HIGH (ref 79.5–101.0)
MONO#: 0.8 10*3/uL (ref 0.1–0.9)
MONO%: 11.2 % (ref 0.0–14.0)
NEUT#: 4.8 10*3/uL (ref 1.5–6.5)
NEUT%: 64.9 % (ref 38.4–76.8)
PLATELETS: 358 10*3/uL (ref 145–400)
RBC: 3.41 10*6/uL — AB (ref 3.70–5.45)
RDW: 15.7 % — ABNORMAL HIGH (ref 11.2–14.5)
WBC: 7.4 10*3/uL (ref 3.9–10.3)
lymph#: 1.6 10*3/uL (ref 0.9–3.3)

## 2016-03-04 MED ORDER — HYDROXYUREA 500 MG PO CAPS: ORAL_CAPSULE | ORAL | 11 refills | Status: DC

## 2016-03-04 NOTE — Assessment & Plan Note (Addendum)
she will continue current medical management. She requested me to relay this information to her cardiologist. I will comply.

## 2016-03-04 NOTE — Assessment & Plan Note (Signed)
She tolerated treatment well. She tolerated hydroxyurea dose at 1000 mg on Saturdays and Sundays and 500 mg daily from Monday to Friday She will continue Xarelto to prevent risk of thrombosis I will see her back in 4 months from now with repeat blood work.

## 2016-03-04 NOTE — Telephone Encounter (Signed)
Appointments scheduled per 1/25 LOS. Patient given AVS report and calendars with future scheduled appointments. °

## 2016-03-04 NOTE — Progress Notes (Signed)
[facility] OFFICE PROGRESS NOTE  Patient Care Team: Gayland Curry, DO as PCP - General (Geriatric Medicine) [facility] Hurman Horn, MD as Consulting Physician (Ophthalmology) Larey Dresser, MD as Consulting Physician (Cardiology) Heath Lark, MD as Consulting Physician (Hematology and Oncology) Clent Jacks, MD as Consulting Physician (Ophthalmology) Jerrell Belfast, MD as Consulting Physician (Otolaryngology)  SUMMARY OF ONCOLOGIC HISTORY:   Polycythemia vera ([place])   08/05/2011 Pathology Results    Peripheral blood JAK2 mutation was positive with low serum erythropoietin level. Bone marrow aspirate and biopsy was not performed.      08/12/2011 -  Chemotherapy    She is started on hydroxyurea with periodic phlebotomy.       INTERVAL HISTORY: Please see below for problem oriented charting. She returns for follow-up. She feels well. Denies recent infection. The patient denies any recent signs or symptoms of bleeding such as spontaneous epistaxis, hematuria or hematochezia.   REVIEW OF SYSTEMS:   Constitutional: Denies fevers, chills or abnormal weight loss Eyes: Denies blurriness of vision Ears, nose, mouth, throat, and face: Denies mucositis or sore throat Respiratory: Denies cough, dyspnea or wheezes Cardiovascular: Denies palpitation, chest discomfort or lower extremity swelling Gastrointestinal:  Denies nausea, heartburn or change in bowel habits Skin: Denies abnormal skin rashes Lymphatics: Denies new lymphadenopathy or easy bruising Neurological:Denies numbness, tingling or new weaknesses Behavioral/Psych: Mood is stable, no new changes  All other systems were reviewed with the patient and are negative.  I have reviewed the past medical history, past surgical history, social history and family history with the patient and they are unchanged from previous note.  ALLERGIES:  is allergic to amlodipine.  MEDICATIONS:  Current  Outpatient Prescriptions  Medication Sig Dispense Refill  . amiodarone (PACERONE) 200 MG tablet Take 100 mg by mouth daily.    . cholecalciferol (VITAMIN D) 1000 UNITS tablet Take 2,000 Units by mouth daily.     . fentaNYL (DURAGESIC - DOSED MCG/HR) 25 MCG/HR patch Place 1 patch (25 mcg total) onto the skin every 3 (three) days. 10 patch 0  . furosemide (LASIX) 40 MG tablet Take 2 tablets (80 mg total) by mouth 2 (two) times daily. 360 tablet 2  . gabapentin (NEURONTIN) 100 MG capsule Take 100 mg by mouth at bedtime.     . hydrALAZINE (APRESOLINE) 50 MG tablet Take 1 tablet (50 mg total) by mouth 3 (three) times daily. 270 tablet 1  . hydroxyurea (HYDREA) 500 MG capsule TAKE 2 CAPSULES ON MONDAYS AND TAKE 1 CAPSULE ALL OTHER DAYS OF THE WEEK. 35 capsule 11  . hydroxyurea (HYDREA) 500 MG capsule May take with food to minimize GI side effects. Take 2 capsules on Saturdays and Sundays and 1 capsule for the rest of the week 90 capsule 11  . levothyroxine (SYNTHROID, LEVOTHROID) 112 MCG tablet TAKE 1 TABLET EACH DAY. 30 tablet 2  . Magnesium 250 MG TABS Take 250 mg by mouth daily.     . Melatonin 10 MG TABS Take 10 mg by mouth at bedtime.    . Multiple Vitamins-Minerals (PRESERVISION AREDS 2) CAPS Take 2 capsules by mouth daily.     . pantoprazole (PROTONIX) 40 MG tablet Take 1 tablet (40 mg total) by mouth daily. 45 tablet 0  . polycarbophil (FIBERCON) 625 MG tablet Take 2 tablets (1,250 mg total) by mouth 2 (two) times daily. 60 tablet 0  . potassium chloride (K-DUR,KLOR-CON) 10 MEQ tablet Take 1 tablet (10 mEq total) by mouth  2 (two) times daily. 60 tablet 0  . Rivaroxaban (XARELTO) 15 MG TABS tablet Take 15 mg by mouth daily with supper.    . traMADol (ULTRAM) 50 MG tablet Take 1 tablet (50 mg total) by mouth at bedtime. 30 tablet 0   No current facility-administered medications for this visit.     PHYSICAL EXAMINATION: ECOG PERFORMANCE STATUS: 0 - Asymptomatic  Vitals:   03/04/16 1520   BP: (!) 157/74  Pulse: 86  Resp: 18  Temp: 98.5 F (36.9 C)   Filed Weights   03/04/16 1520  Weight: 113 lb 6.4 oz (51.4 kg)    GENERAL:alert, no distress and comfortable SKIN: skin color, texture, turgor are normal, no rashes or significant lesions EYES: normal, Conjunctiva are pink and non-injected, sclera clear Musculoskeletal:no cyanosis of digits and no clubbing  NEURO: alert & oriented x 3 with fluent speech, no focal motor/sensory deficits  LABORATORY DATA:  I have reviewed the data as listed    Component Value Date/Time   NA 137 02/18/2016 1514   NA 139 06/20/2015 0903   K 3.8 02/18/2016 1514   CL 96 (L) 02/18/2016 1514   CO2 31 02/18/2016 1514   GLUCOSE 94 02/18/2016 1514   BUN 16 02/18/2016 1514   BUN 24 06/20/2015 0903   CREATININE 0.86 02/18/2016 1514   CREATININE 0.81 12/22/2015 1407   CALCIUM 10.7 (H) 02/18/2016 1514   PROT 6.7 02/18/2016 1514   PROT 6.2 06/20/2015 0903   ALBUMIN 4.7 02/18/2016 1514   ALBUMIN 4.6 06/20/2015 0903   AST 18 02/18/2016 1514   ALT 14 02/18/2016 1514   ALKPHOS 49 02/18/2016 1514   BILITOT 0.9 02/18/2016 1514   BILITOT 0.7 06/20/2015 0903   GFRNONAA >60 02/18/2016 1514   GFRAA >60 02/18/2016 1514    No results found for: SPEP, UPEP  Lab Results  Component Value Date   WBC 7.4 03/04/2016   NEUTROABS 4.8 03/04/2016   HGB 13.2 03/04/2016   HCT 39.9 03/04/2016   MCV 117.0 (H) 03/04/2016   PLT 358 03/04/2016      Chemistry      Component Value Date/Time   NA 137 02/18/2016 1514   NA 139 06/20/2015 0903   K 3.8 02/18/2016 1514   CL 96 (L) 02/18/2016 1514   CO2 31 02/18/2016 1514   BUN 16 02/18/2016 1514   BUN 24 06/20/2015 0903   CREATININE 0.86 02/18/2016 1514   CREATININE 0.81 12/22/2015 1407   GLU 80 10/29/2014      Component Value Date/Time   CALCIUM 10.7 (H) 02/18/2016 1514   ALKPHOS 49 02/18/2016 1514   AST 18 02/18/2016 1514   ALT 14 02/18/2016 1514   BILITOT 0.9 02/18/2016 1514   BILITOT 0.7  06/20/2015 0903       ASSESSMENT & PLAN:  Polycythemia vera She tolerated treatment well. She tolerated hydroxyurea dose at 1000 mg on Saturdays and Sundays and 500 mg daily from Monday to Friday She will continue Xarelto to prevent risk of thrombosis I will see her back in 4 months from now with repeat blood work.  Essential hypertension she will continue current medical management. She requested me to relay this information to her cardiologist. I will comply.   No orders of the defined types were placed in this encounter.  All questions were answered. The patient knows to call the clinic with any problems, questions or concerns. No barriers to learning was detected. I spent 5 minutes counseling the patient face to face.  The total time spent in the appointment was 10 minutes and more than 50% was on counseling and review of test results     Heath Lark, MD 03/04/2016 5:57 PM

## 2016-03-17 ENCOUNTER — Encounter: Payer: Self-pay | Admitting: Internal Medicine

## 2016-03-17 ENCOUNTER — Non-Acute Institutional Stay: Payer: Medicare Other | Admitting: Internal Medicine

## 2016-03-17 VITALS — BP 100/60 | HR 63 | Temp 98.3°F | Wt 112.0 lb

## 2016-03-17 DIAGNOSIS — G8929 Other chronic pain: Secondary | ICD-10-CM

## 2016-03-17 DIAGNOSIS — I480 Paroxysmal atrial fibrillation: Secondary | ICD-10-CM

## 2016-03-17 DIAGNOSIS — M545 Low back pain, unspecified: Secondary | ICD-10-CM

## 2016-03-17 DIAGNOSIS — I10 Essential (primary) hypertension: Secondary | ICD-10-CM | POA: Diagnosis not present

## 2016-03-17 DIAGNOSIS — R51 Headache: Secondary | ICD-10-CM

## 2016-03-17 DIAGNOSIS — M810 Age-related osteoporosis without current pathological fracture: Secondary | ICD-10-CM

## 2016-03-17 DIAGNOSIS — R42 Dizziness and giddiness: Secondary | ICD-10-CM

## 2016-03-17 DIAGNOSIS — R519 Headache, unspecified: Secondary | ICD-10-CM

## 2016-03-17 NOTE — Progress Notes (Signed)
Location:  Occupational psychologist of Service:  Clinic (12)  Provider: Honey Zakarian L. Mariea Clonts, D.O., C.M.D.  Code Status: DNR Goals of Care:  Advanced Directives 03/17/2016  Does Patient Have a Medical Advance Directive? Yes  Type of Paramedic of [place];Living will  Does patient want to make changes to medical advance directive? -  Copy of [place] in Chart? Yes  Would patient like information on creating a medical advance directive? -  Pre-existing out of facility DNR order (yellow form or pink MOST form) -     Chief Complaint  Patient presents with  . Medical Management of Chronic Issues    21mth follow-up    HPI: Patient is a 83 y.o. female seen today for medical management of chronic diseases.    Reports headaches and dizziness sometimes.  bp great in the morning when she sees Bernadette to change fentanyl.  Usually 129/70 ish in am.   BP readings ok after the first pill.  Headache and dizziness starts late afternoon.    Saw Dr. Alvy Bimler and continues on hydrea--blood counts improved.    Low back pain:  About 1 year from ablation and pain is beginning to return slightly despite her fentanyl and tramadol.  AFib:  Had ablation with temporary success but now irregularly irregular again today.  Osteoporosis:  Is going to get her next prolia in late May--needs to clear with insurance.  Continues her exercise program.  No pain in right hip from fracture and insists upon calling it her leg not hip that was fractured.   Past Medical History:  Diagnosis Date  . Abnormal stress test    a. 11/2011 Ex MV: EF 80%, small, partially reversible anteroapical defect->mild ischemia vs attenuation-->med Rx.  [name] Kitchen Alopecia, unspecified   . Chronic diastolic CHF (congestive heart failure) ([place])    a. 11/2013 Echo: EF 60-65%, no rwma, mild TR, PASP 54mmHg.  . Closed fracture of lower end of radius with ulna   . Deafness    Left, s/p  multiple surgeries  . Essential hypertension   . Gouty arthropathy, unspecified   . Hypothyroidism   . Insomnia, unspecified   . Macular degeneration    Left, s/p inj. tx  . Mitral valve disorders(424.0)   . Neck mass    right, w/u including MRI negative  . PAF (paroxysmal atrial fibrillation) (HCC)    a. on amio (02/2012 mild obstruction on PFT's) and xarelto.  . Polycythemia vera(238.4)   . Pure hypercholesterolemia   . Senile osteoporosis    Reclast in the past  . Tricuspid valve disorders, specified as nonrheumatic     Past Surgical History:  Procedure Laterality Date  . ABLATION  08/28/14   Back   . BREAST BIOPSY  06/18/1998   left  . BREAST BIOPSY    . ELECTROPHYSIOLOGIC STUDY N/A 01/08/2016   Procedure: Atrial Fibrillation Ablation;  Surgeon: Thompson Grayer, MD;  Location: [facility];  Service: Cardiovascular;  Laterality: N/A;  . FEMUR IM NAIL Right 10/08/2015   Procedure: INTRAMEDULLARY (IM) NAIL FEMORAL RIGHT;  Surgeon: Paralee Cancel, MD;  Location: [facility];  Service: Orthopedics;  Laterality: Right;  . TONSILLECTOMY    . TOTAL ABDOMINAL HYSTERECTOMY    . TYMPANOPLASTY Bilateral     Allergies  Allergen Reactions  . Amlodipine Swelling and Other (See Comments)    Reaction:  Lower extremity swelling     Allergies as of 03/17/2016  Reactions   Amlodipine Swelling, Other (See Comments)   Reaction:  Lower extremity swelling      Medication List       Accurate as of 03/17/16  3:33 PM. Always use your most recent med list.          amiodarone 200 MG tablet Commonly known as:  PACERONE Take 100 mg by mouth daily.   cholecalciferol 1000 units tablet Commonly known as:  VITAMIN D Take 2,000 Units by mouth daily.   fentaNYL 25 MCG/HR patch Commonly known as:  DURAGESIC - dosed mcg/hr Place 1 patch (25 mcg total) onto the skin every 3 (three) days.   furosemide 40 MG tablet Commonly known as:  LASIX Take 2 tablets (80 mg total) by mouth 2 (two)  times daily.   gabapentin 100 MG capsule Commonly known as:  NEURONTIN Take 100 mg by mouth at bedtime.   hydrALAZINE 50 MG tablet Commonly known as:  APRESOLINE Take 1 tablet (50 mg total) by mouth 3 (three) times daily.   hydroxyurea 500 MG capsule Commonly known as:  HYDREA May take with food to minimize GI side effects. Take 2 capsules on Saturdays and Sundays and 1 capsule for the rest of the week   levothyroxine 112 MCG tablet Commonly known as:  SYNTHROID, LEVOTHROID TAKE 1 TABLET EACH DAY.   Magnesium 250 MG Tabs Take 250 mg by mouth daily.   Melatonin 10 MG Tabs Take 10 mg by mouth at bedtime.   pantoprazole 40 MG tablet Commonly known as:  PROTONIX Take 1 tablet (40 mg total) by mouth daily.   polycarbophil 625 MG tablet Commonly known as:  FIBERCON Take 2 tablets (1,250 mg total) by mouth 2 (two) times daily.   potassium chloride 10 MEQ tablet Commonly known as:  K-DUR,KLOR-CON Take 1 tablet (10 mEq total) by mouth 2 (two) times daily.   PRESERVISION AREDS 2 Caps Take 2 capsules by mouth daily.   Rivaroxaban 15 MG Tabs tablet Commonly known as:  XARELTO Take 15 mg by mouth daily with supper.   traMADol 50 MG tablet Commonly known as:  ULTRAM Take 1 tablet (50 mg total) by mouth at bedtime.       Review of Systems:  Review of Systems  Constitutional: Positive for malaise/fatigue. Negative for chills, fever and weight loss.  HENT: Positive for hearing loss.   Eyes: Negative for blurred vision.  Respiratory: Negative for shortness of breath.   Cardiovascular: Negative for chest pain, palpitations and leg swelling.  Gastrointestinal: Positive for constipation. Negative for abdominal pain.  Genitourinary: Negative for dysuria.  Musculoskeletal: Positive for back pain. Negative for falls and joint pain.  Skin: Negative for rash.  Neurological: Positive for dizziness and headaches. Negative for loss of consciousness and weakness.    Psychiatric/Behavioral: Negative for depression and memory loss. The patient is not nervous/anxious and does not have insomnia.     Health Maintenance  Topic Date Due  . TETANUS/TDAP  08/03/1949  . INFLUENZA VACCINE  Completed  . DEXA SCAN  Completed  . ZOSTAVAX  Completed  . PNA vac Low Risk Adult  Completed    Physical Exam: Vitals:   03/17/16 1518  BP: 100/60  Pulse: 63  Temp: 98.3 F (36.8 C)  TempSrc: Oral  SpO2: 94%  Weight: 112 lb (50.8 kg)   Body mass index is 21.87 kg/m. Physical Exam  Constitutional: She is oriented to person, place, and time. She appears well-developed and well-nourished. No distress.  Cardiovascular:  irreg irreg  Pulmonary/Chest: Effort normal and breath sounds normal. No respiratory distress.  Musculoskeletal:  Walks with lightweight walker  Neurological: She is alert and oriented to person, place, and time.  Skin: Skin is warm and dry.  Psychiatric: She has a normal mood and affect.    Labs reviewed: Basic Metabolic Panel:  Recent Labs  07/18/15 1506  01/20/16 1515 02/05/16 1427 02/18/16 1514  NA  --   < > 136 137 137  K  --   < > 4.0 3.7 3.8  CL  --   < > 98* 98* 96*  CO2  --   < > 31 29 31   GLUCOSE  --   < > 92 91 94  BUN  --   < > 26* 17 16  CREATININE  --   < > 0.98 0.76 0.86  CALCIUM  --   < > 9.9 10.1 10.7*  TSH 2.21  --   --   --  1.232  < > = values in this interval not displayed. Liver Function Tests:  Recent Labs  06/20/15 0903 10/06/15 0408 02/18/16 1514  AST 17 18 18   ALT 11 18 14   ALKPHOS 72 49 49  BILITOT 0.7 0.6 0.9  PROT 6.2 6.5 6.7  ALBUMIN 4.6 4.3 4.7   No results for input(s): LIPASE, AMYLASE in the last 8760 hours. No results for input(s): AMMONIA in the last 8760 hours. CBC:  Recent Labs  12/08/15 1447 12/22/15 1407 01/08/16 0546 01/09/16 0628 03/04/16 1416  WBC 9.1 6.5 6.1 10.6* 7.4  NEUTROABS 6.7* 4,550  --   --  4.8  HGB 14.1 13.3 12.8 11.2* 13.2  HCT 43.2 40.9 39.2 34.9* 39.9   MCV 114.7* 113.6* 113.3* 115.9* 117.0*  PLT 338 388 340 254 358   Lipid Panel:  Recent Labs  07/29/15 1200  CHOL 162  HDL 101*  LDLCALC 42  TRIG 100   Assessment/Plan 1. Paroxysmal atrial fibrillation (HCC) -rate controlled, but unfortunately seems to be back in afib at present after ablation despite amiodarone 200mg , is on xarelto anticoagulation with stable h/h and renal function  2. Essential hypertension -bp at goal here and on readings in am by [facility] clinic nurse, but she thinks it gets higher later in day so we will have her get it checked then  3. Dizziness -see #4  4. Generalized headaches -seems this may be due to afternoon hypertension--pt to check bp after her second hydralazine tablet; however, she had this dizziness and headache when she went into afib and seems to be back in it on exam today despite ablation  5. Chronic bilateral low back pain without sciatica -cont fentanyl patch and tramadol, soon due for another epidural if pain returns like it has previously  6. Senile osteoporosis -cont vit D and prolia due in end of May  Labs/tests ordered:  prolia in end of May Next appt:  F/u end of May  Jaquavian Firkus L. Tyresha Fede, D.O. [facility] [address], [place] [address] Cell Phone (Mon-Fri 8am-5pm):  [phone] On Call:  [phone] & follow prompts after 5pm & weekends Office Phone:  [phone] Office Fax:  [phone]

## 2016-03-17 NOTE — Patient Instructions (Addendum)
Get bp checked after your second hydralazine to make sure that's not why you are dizzy and having headaches in the afternoons.  I'm concerned it's more likely from your afib being back.

## 2016-03-19 ENCOUNTER — Other Ambulatory Visit: Payer: Self-pay | Admitting: *Deleted

## 2016-03-19 MED ORDER — FENTANYL 25 MCG/HR TD PT72: 25.0000 ug | MEDICATED_PATCH | TRANSDERMAL | 0 refills | Status: DC

## 2016-03-19 NOTE — Telephone Encounter (Signed)
Patient requested and will pick up 

## 2016-03-22 ENCOUNTER — Other Ambulatory Visit: Payer: Self-pay | Admitting: Internal Medicine

## 2016-03-22 NOTE — Telephone Encounter (Signed)
rx called into pharmacy

## 2016-03-25 ENCOUNTER — Other Ambulatory Visit: Payer: Self-pay | Admitting: Internal Medicine

## 2016-03-30 DIAGNOSIS — Z961 Presence of intraocular lens: Secondary | ICD-10-CM | POA: Diagnosis not present

## 2016-03-30 DIAGNOSIS — H02831 Dermatochalasis of right upper eyelid: Secondary | ICD-10-CM | POA: Diagnosis not present

## 2016-03-30 DIAGNOSIS — H353111 Nonexudative age-related macular degeneration, right eye, early dry stage: Secondary | ICD-10-CM | POA: Diagnosis not present

## 2016-03-30 DIAGNOSIS — H353221 Exudative age-related macular degeneration, left eye, with active choroidal neovascularization: Secondary | ICD-10-CM | POA: Diagnosis not present

## 2016-03-30 DIAGNOSIS — H02834 Dermatochalasis of left upper eyelid: Secondary | ICD-10-CM | POA: Diagnosis not present

## 2016-04-01 ENCOUNTER — Telehealth (HOSPITAL_COMMUNITY): Payer: Self-pay | Admitting: *Deleted

## 2016-04-01 NOTE — Telephone Encounter (Signed)
-----   Message from Scarlette Calico, RN sent at 03/30/2016  8:54 AM EST ----- Hey, see below DM wanted you guys to see her back, she is sch to see [facility] 3/5 so I don't know if that's ok if you guys want to bring her in sooner, she is in a nursing facility  ----- Message ----- From: Larey Dresser, MD Sent: 03/27/2016   3:23 PM To: Scarlette Calico, RN, [place]  Please make Mrs Wolsky an appointment in atrial fibrillation clinic for evaluation of rhythm.   ----- Message ----- From: Gayland Curry, DO Sent: 03/27/2016  12:41 PM To: Larey Dresser, MD  Mrs. Spratlin was having increased dizziness and headaches in the afternoon, irreg irreg on exam.

## 2016-04-01 NOTE — Telephone Encounter (Signed)
Called and checked on patient -- states she is feeling ok most days. She does have dizziness at times but it is not daily. She does not feel she is in afib and overall feels as though she can do her normal activities. Offered pt appt to come in and be checked out but patient states she doesn't feel this is necessary but will call if anything changes. She has appt with Dr. Rayann Heman on 3/5 but will call if needing to be seen earlier.

## 2016-04-08 ENCOUNTER — Encounter: Payer: Self-pay | Admitting: Nurse Practitioner

## 2016-04-08 ENCOUNTER — Ambulatory Visit (INDEPENDENT_AMBULATORY_CARE_PROVIDER_SITE_OTHER): Payer: Medicare Other | Admitting: Nurse Practitioner

## 2016-04-08 VITALS — BP 136/72 | HR 81 | Temp 98.1°F | Resp 18 | Wt 112.2 lb

## 2016-04-08 DIAGNOSIS — J0140 Acute pansinusitis, unspecified: Secondary | ICD-10-CM | POA: Diagnosis not present

## 2016-04-08 DIAGNOSIS — J302 Other seasonal allergic rhinitis: Secondary | ICD-10-CM

## 2016-04-08 MED ORDER — AMOXICILLIN 500 MG PO CAPS: 1000.0000 mg | ORAL_CAPSULE | Freq: Three times a day (TID) | ORAL | 0 refills | Status: DC

## 2016-04-08 MED ORDER — FLUTICASONE PROPIONATE 50 MCG/ACT NA SUSP: 2.0000 | Freq: Every day | NASAL | 6 refills | Status: DC

## 2016-04-08 NOTE — Progress Notes (Signed)
Careteam: Patient Care Team: Gayland Curry, DO as PCP - General (Geriatric Medicine) [facility] Hurman Horn, MD as Consulting Physician (Ophthalmology) Larey Dresser, MD as Consulting Physician (Cardiology) Heath Lark, MD as Consulting Physician (Hematology and Oncology) Clent Jacks, MD as Consulting Physician (Ophthalmology) Jerrell Belfast, MD as Consulting Physician (Otolaryngology)  Advanced Directive information Does Patient Have a Medical Advance Directive?: Yes, Type of Advance Directive: [place];Living will  Allergies  Allergen Reactions  . Amlodipine Swelling and Other (See Comments)    Reaction:  Lower extremity swelling     Chief Complaint  Patient presents with  . Acute Visit    Sinus infection      HPI: Patient is a 83 y.o. female seen in the office today due to not feeling well. Starting having headache yesterday. Feels like head in in a barrel. Can not hear. Using nasal spray that is out dated. It has been helpful.  1 week ago she started coughing up yellow sputum. No more cough.  Post nasal drip and runny nose.  No fevers or chills.  No chest congestion, shortness of breath or ongoing cough.  Review of Systems:  Review of Systems  Constitutional: Positive for activity change and fatigue. Negative for appetite change, fever and unexpected weight change.  HENT: Positive for congestion, hearing loss, postnasal drip, rhinorrhea and sinus pressure. Negative for sore throat.   Eyes: Negative for discharge.  Respiratory: Negative for cough, chest tightness, shortness of breath and wheezing.   Cardiovascular: Negative for chest pain, palpitations and leg swelling.  Skin: Negative for color change.  Allergic/Immunologic: Positive for environmental allergies (mild).  Neurological: Negative for dizziness and light-headedness.  Psychiatric/Behavioral: Negative for agitation, behavioral problems, confusion and sleep  disturbance. The patient is not nervous/anxious.     Past Medical History:  Diagnosis Date  . Abnormal stress test    a. 11/2011 Ex MV: EF 80%, small, partially reversible anteroapical defect->mild ischemia vs attenuation-->med Rx.  [name] Kitchen Alopecia, unspecified   . Chronic diastolic CHF (congestive heart failure) ([place])    a. 11/2013 Echo: EF 60-65%, no rwma, mild TR, PASP 50mmHg.  . Closed fracture of lower end of radius with ulna   . Deafness    Left, s/p multiple surgeries  . Essential hypertension   . Gouty arthropathy, unspecified   . Hypothyroidism   . Insomnia, unspecified   . Macular degeneration    Left, s/p inj. tx  . Mitral valve disorders(424.0)   . Neck mass    right, w/u including MRI negative  . PAF (paroxysmal atrial fibrillation) (HCC)    a. on amio (02/2012 mild obstruction on PFT's) and xarelto.  . Polycythemia vera(238.4)   . Pure hypercholesterolemia   . Senile osteoporosis    Reclast in the past  . Tricuspid valve disorders, specified as nonrheumatic    Past Surgical History:  Procedure Laterality Date  . ABLATION  08/28/14   Back   . BREAST BIOPSY  06/18/1998   left  . BREAST BIOPSY    . ELECTROPHYSIOLOGIC STUDY N/A 01/08/2016   Procedure: Atrial Fibrillation Ablation;  Surgeon: Thompson Grayer, MD;  Location: [place] [facility];  Service: Cardiovascular;  Laterality: N/A;  . FEMUR IM NAIL Right 10/08/2015   Procedure: INTRAMEDULLARY (IM) NAIL FEMORAL RIGHT;  Surgeon: Paralee Cancel, MD;  Location: [facility];  Service: Orthopedics;  Laterality: Right;  . TONSILLECTOMY    . TOTAL ABDOMINAL HYSTERECTOMY    . TYMPANOPLASTY Bilateral  Social History:   reports that she quit smoking about 32 years ago. Her smoking use included Cigarettes. She has never used smokeless tobacco. She reports that she drinks about 0.6 oz of alcohol per week . She reports that she does not use drugs.  Family History  Problem Relation Age of Onset  . Hypertension Father   . Heart disease  Father     patient does not know details.   . Ovarian cancer Mother 57  . Cancer Sister     colon  . Heart disease Sister   . Cancer Sister   . Cancer Sister     hodgkin disease  . Breast cancer Daughter   . Cancer Daughter     breast    Medications: Patient's Medications  New Prescriptions   No medications on file  Previous Medications   AMIODARONE (PACERONE) 200 MG TABLET    TAKE 1/2 TABLET EVERY DAY.   CHOLECALCIFEROL (VITAMIN D) 1000 UNITS TABLET    Take 2,000 Units by mouth daily.    FENTANYL (DURAGESIC - DOSED MCG/HR) 25 MCG/HR PATCH    Place 1 patch (25 mcg total) onto the skin every 3 (three) days.   FUROSEMIDE (LASIX) 40 MG TABLET    Take 2 tablets (80 mg total) by mouth 2 (two) times daily.   GABAPENTIN (NEURONTIN) 100 MG CAPSULE    Take 100 mg by mouth at bedtime.    HYDRALAZINE (APRESOLINE) 50 MG TABLET    Take 1 tablet (50 mg total) by mouth 3 (three) times daily.   HYDROXYUREA (HYDREA) 500 MG CAPSULE    May take with food to minimize GI side effects. Take 2 capsules on Saturdays and Sundays and 1 capsule for the rest of the week   LEVOTHYROXINE (SYNTHROID, LEVOTHROID) 112 MCG TABLET    TAKE 1 TABLET EACH DAY.   MAGNESIUM 250 MG TABS    Take 250 mg by mouth daily.    MELATONIN 10 MG TABS    Take 10 mg by mouth at bedtime.   MULTIPLE VITAMINS-MINERALS (PRESERVISION AREDS 2) CAPS    Take 2 capsules by mouth daily.    POLYCARBOPHIL (FIBERCON) 625 MG TABLET    Take 2 tablets (1,250 mg total) by mouth 2 (two) times daily.   POTASSIUM CHLORIDE (K-DUR,KLOR-CON) 10 MEQ TABLET    Take 1 tablet (10 mEq total) by mouth 2 (two) times daily.   RIVAROXABAN (XARELTO) 15 MG TABS TABLET    Take 15 mg by mouth daily with supper.   TRAMADOL (ULTRAM) 50 MG TABLET    TAKE ONE TABLET AT BEDTIME.  Modified Medications   No medications on file  Discontinued Medications   PANTOPRAZOLE (PROTONIX) 40 MG TABLET    Take 1 tablet (40 mg total) by mouth daily.     Physical Exam:  Vitals:    04/08/16 0945  BP: 136/72  Pulse: 81  Resp: 18  Temp: 98.1 F (36.7 C)  TempSrc: Oral  SpO2: 95%  Weight: 112 lb 3.2 oz (50.9 kg)   Body mass index is 21.91 kg/m.  Physical Exam  Constitutional: She is oriented to person, place, and time. She appears well-developed and well-nourished. No distress.  HENT:  Head: Normocephalic and atraumatic.  Right Ear: Hearing and external ear normal. No drainage or tenderness. Tympanic membrane is injected. A middle ear effusion is present.  Mouth/Throat: Uvula is midline and oropharynx is clear and moist. No oropharyngeal exudate.  Wax limiting visualization in left ear  Cardiovascular: Normal rate.  An irregularly irregular rhythm present.  irreg irreg  Pulmonary/Chest: Effort normal and breath sounds normal. No respiratory distress.  Musculoskeletal:  Walks with lightweight walker  Neurological: She is alert and oriented to person, place, and time.  Skin: Skin is warm and dry.  Psychiatric: She has a normal mood and affect.    Labs reviewed: Basic Metabolic Panel:  Recent Labs  07/18/15 1506  01/20/16 1515 02/05/16 1427 02/18/16 1514  NA  --   < > 136 137 137  K  --   < > 4.0 3.7 3.8  CL  --   < > 98* 98* 96*  CO2  --   < > 31 29 31   GLUCOSE  --   < > 92 91 94  BUN  --   < > 26* 17 16  CREATININE  --   < > 0.98 0.76 0.86  CALCIUM  --   < > 9.9 10.1 10.7*  TSH 2.21  --   --   --  1.232  < > = values in this interval not displayed. Liver Function Tests:  Recent Labs  06/20/15 0903 10/06/15 0408 02/18/16 1514  AST 17 18 18   ALT 11 18 14   ALKPHOS 72 49 49  BILITOT 0.7 0.6 0.9  PROT 6.2 6.5 6.7  ALBUMIN 4.6 4.3 4.7   No results for input(s): LIPASE, AMYLASE in the last 8760 hours. No results for input(s): AMMONIA in the last 8760 hours. CBC:  Recent Labs  12/08/15 1447 12/22/15 1407 01/08/16 0546 01/09/16 0628 03/04/16 1416  WBC 9.1 6.5 6.1 10.6* 7.4  NEUTROABS 6.7* 4,550  --   --  4.8  HGB 14.1 13.3 12.8  11.2* 13.2  HCT 43.2 40.9 39.2 34.9* 39.9  MCV 114.7* 113.6* 113.3* 115.9* 117.0*  PLT 338 388 340 254 358   Lipid Panel:  Recent Labs  07/29/15 1200  CHOL 162  HDL 101*  LDLCALC 42  TRIG 100   TSH:  Recent Labs  07/18/15 1506 02/18/16 1514  TSH 2.21 1.232   A1C: No results found for: HGBA1C   Assessment/Plan 1. Acute non-recurrent pansinusitis To use nettipot twice daily -cont to use flonase nasal spray daily which has helped -to use OTC for the next few days and if no relief or worsening of symptoms to start  amoxicillin (AMOXIL) 500 MG capsule; Take 2 capsules (1,000 mg total) by mouth 3 (three) times daily.  Dispense: 42 capsule; Refill: 0  2. Acute seasonal allergic rhinitis, unspecified trigger loratadine 10 mg daily  - fluticasone (FLONASE) 50 MCG/ACT nasal spray; Place 2 sprays into both nostrils daily.  Dispense: 16 g; Refill: 6   Advay Volante K. Harle Battiest  [facility] [phone] 8 am - 5 pm) [phone] (after hours)

## 2016-04-08 NOTE — Patient Instructions (Addendum)
Start Loratadine 10 mg daily for congestion (over the counter)   to use nettipot twice daily Cont to use nasal spray Fluticasone - 2 sprays to each nostril daily  Prescription for amoxicillin sent to pharmacy- only take if symptoms do not improve with above recommendations and  take full course of medication.    Sinusitis, Adult Sinusitis is soreness and inflammation of your sinuses. Sinuses are hollow spaces in the bones around your face. Your sinuses are located:  Around your eyes.  In the middle of your forehead.  Behind your nose.  In your cheekbones. Your sinuses and nasal passages are lined with a stringy fluid (mucus). Mucus normally drains out of your sinuses. When your nasal tissues become inflamed or swollen, the mucus can become trapped or blocked so air cannot flow through your sinuses. This allows bacteria, viruses, and funguses to grow, which leads to infection. Sinusitis can develop quickly and last for 7?10 days (acute) or for more than 12 weeks (chronic). Sinusitis often develops after a cold. What are the causes? This condition is caused by anything that creates swelling in the sinuses or stops mucus from draining, including:  Allergies.  Asthma.  Bacterial or viral infection.  Abnormally shaped bones between the nasal passages.  Nasal growths that contain mucus (nasal polyps).  Narrow sinus openings.  Pollutants, such as chemicals or irritants in the air.  A foreign object stuck in the nose.  A fungal infection. This is rare. What increases the risk? The following factors may make you more likely to develop this condition:  Having allergies or asthma.  Having had a recent cold or respiratory tract infection.  Having structural deformities or blockages in your nose or sinuses.  Having a weak immune system.  Doing a lot of swimming or diving.  Overusing nasal sprays.  Smoking. What are the signs or symptoms? The main symptoms of this condition  are pain and a feeling of pressure around the affected sinuses. Other symptoms include:  Upper toothache.  Earache.  Headache.  Bad breath.  Decreased sense of smell and taste.  A cough that may get worse at night.  Fatigue.  Fever.  Thick drainage from your nose. The drainage is often green and it may contain pus (purulent).  Stuffy nose or congestion.  Postnasal drip. This is when extra mucus collects in the throat or back of the nose.  Swelling and warmth over the affected sinuses.  Sore throat.  Sensitivity to light. How is this diagnosed? This condition is diagnosed based on symptoms, a medical history, and a physical exam. To find out if your condition is acute or chronic, your health care provider may:  Look in your nose for signs of nasal polyps.  Tap over the affected sinus to check for signs of infection.  View the inside of your sinuses using an imaging device that has a light attached (endoscope). If your health care provider suspects that you have chronic sinusitis, you may also:  Be tested for allergies.  Have a sample of mucus taken from your nose (nasal culture) and checked for bacteria.  Have a mucus sample examined to see if your sinusitis is related to an allergy. If your sinusitis does not respond to treatment and it lasts longer than 8 weeks, you may have an MRI or CT scan to check your sinuses. These scans also help to determine how severe your infection is. In rare cases, a bone biopsy may be done to rule out more serious  types of fungal sinus disease. How is this treated? Treatment for sinusitis depends on the cause and whether your condition is chronic or acute. If a virus is causing your sinusitis, your symptoms will go away on their own within 10 days. You may be given medicines to relieve your symptoms, including:  Topical nasal decongestants. They shrink swollen nasal passages and let mucus drain from your sinuses.  Antihistamines. These  drugs block inflammation that is triggered by allergies. This can help to ease swelling in your nose and sinuses.  Topical nasal corticosteroids. These are nasal sprays that ease inflammation and swelling in your nose and sinuses.  Nasal saline washes. These rinses can help to get rid of thick mucus in your nose. If your condition is caused by bacteria, you will be given an antibiotic medicine. If your condition is caused by a fungus, you will be given an antifungal medicine. Surgery may be needed to correct underlying conditions, such as narrow nasal passages. Surgery may also be needed to remove polyps. Follow these instructions at home: Medicines   Take, use, or apply over-the-counter and prescription medicines only as told by your health care provider. These may include nasal sprays.  If you were prescribed an antibiotic medicine, take it as told by your health care provider. Do not stop taking the antibiotic even if you start to feel better. Hydrate and Humidify   Drink enough water to keep your urine clear or pale yellow. Staying hydrated will help to thin your mucus.  Use a cool mist humidifier to keep the humidity level in your home above 50%.  Inhale steam for 10-15 minutes, 3-4 times a day or as told by your health care provider. You can do this in the bathroom while a hot shower is running.  Limit your exposure to cool or dry air. Rest   Rest as much as possible.  Sleep with your head raised (elevated).  Make sure to get enough sleep each night. General instructions   Apply a warm, moist washcloth to your face 3-4 times a day or as told by your health care provider. This will help with discomfort.  Wash your hands often with soap and water to reduce your exposure to viruses and other germs. If soap and water are not available, use hand sanitizer.  Do not smoke. Avoid being around people who are smoking (secondhand smoke).  Keep all follow-up visits as told by your  health care provider. This is important. Contact a health care provider if:  You have a fever.  Your symptoms get worse.  Your symptoms do not improve within 10 days. Get help right away if:  You have a severe headache.  You have persistent vomiting.  You have pain or swelling around your face or eyes.  You have vision problems.  You develop confusion.  Your neck is stiff.  You have trouble breathing. This information is not intended to replace advice given to you by your health care provider. Make sure you discuss any questions you have with your health care provider. Document Released: 01/25/2005 Document Revised: 09/21/2015 Document Reviewed: 11/20/2014 Elsevier Interactive Patient Education  2017 [employer].

## 2016-04-12 ENCOUNTER — Encounter: Payer: Self-pay | Admitting: Internal Medicine

## 2016-04-12 ENCOUNTER — Ambulatory Visit (INDEPENDENT_AMBULATORY_CARE_PROVIDER_SITE_OTHER): Payer: Medicare Other | Admitting: Internal Medicine

## 2016-04-12 VITALS — BP 142/74 | HR 60 | Ht 60.0 in | Wt 115.0 lb

## 2016-04-12 DIAGNOSIS — I495 Sick sinus syndrome: Secondary | ICD-10-CM

## 2016-04-12 DIAGNOSIS — I480 Paroxysmal atrial fibrillation: Secondary | ICD-10-CM | POA: Diagnosis not present

## 2016-04-12 DIAGNOSIS — I5032 Chronic diastolic (congestive) heart failure: Secondary | ICD-10-CM

## 2016-04-12 MED ORDER — FUROSEMIDE 40 MG PO TABS: ORAL_TABLET | ORAL | 2 refills | Status: DC

## 2016-04-12 NOTE — Progress Notes (Signed)
PCP:  Hollace Kinnier, DO Primary Cardiologist:  Dr Aundra Dubin  The patient presents today for electrophysiology followup.  She is doing well.  Her edema is improved.  SOB is improved.  She is unaware of afib.  Appears to be primarily in sinus rhythm.   Today, she denies symptoms of chest pain, shortness of breath, orthopnea, PND, dizziness, presyncope, syncope, or neurologic sequela.  The patient feels that she is tolerating medications without difficulties and is otherwise without complaint today.   Past Medical History:  Diagnosis Date  . Abnormal stress test    a. 11/2011 Ex MV: EF 80%, small, partially reversible anteroapical defect->mild ischemia vs attenuation-->med Rx.  [name] Kitchen Alopecia, unspecified   . Chronic diastolic CHF (congestive heart failure) ([place])    a. 11/2013 Echo: EF 60-65%, no rwma, mild TR, PASP 90mmHg.  . Closed fracture of lower end of radius with ulna   . Deafness    Left, s/p multiple surgeries  . Essential hypertension   . Gouty arthropathy, unspecified   . Hypothyroidism   . Insomnia, unspecified   . Macular degeneration    Left, s/p inj. tx  . Mitral valve disorders(424.0)   . Neck mass    right, w/u including MRI negative  . PAF (paroxysmal atrial fibrillation) (HCC)    a. on amio (02/2012 mild obstruction on PFT's) and xarelto.  . Polycythemia vera(238.4)   . Pure hypercholesterolemia   . Senile osteoporosis    Reclast in the past  . Tricuspid valve disorders, specified as nonrheumatic    Past Surgical History:  Procedure Laterality Date  . ABLATION  08/28/14   Back   . BREAST BIOPSY  06/18/1998   left  . BREAST BIOPSY    . ELECTROPHYSIOLOGIC STUDY N/A 01/08/2016   Procedure: Atrial Fibrillation Ablation;  Surgeon: Thompson Grayer, MD;  Location: [facility];  Service: Cardiovascular;  Laterality: N/A;  . FEMUR IM NAIL Right 10/08/2015   Procedure: INTRAMEDULLARY (IM) NAIL FEMORAL RIGHT;  Surgeon: Paralee Cancel, MD;  Location: [facility];  Service:  Orthopedics;  Laterality: Right;  . TONSILLECTOMY    . TOTAL ABDOMINAL HYSTERECTOMY    . TYMPANOPLASTY Bilateral     Current Outpatient Prescriptions  Medication Sig Dispense Refill  . amiodarone (PACERONE) 200 MG tablet TAKE 1/2 TABLET EVERY DAY. 15 tablet 5  . amoxicillin (AMOXIL) 500 MG capsule Take 2 capsules (1,000 mg total) by mouth 3 (three) times daily. 42 capsule 0  . cholecalciferol (VITAMIN D) 1000 UNITS tablet Take 2,000 Units by mouth daily.     . fentaNYL (DURAGESIC - DOSED MCG/HR) 25 MCG/HR patch Place 1 patch (25 mcg total) onto the skin every 3 (three) days. 10 patch 0  . fluticasone (FLONASE) 50 MCG/ACT nasal spray Place 2 sprays into both nostrils daily. 16 g 6  . furosemide (LASIX) 40 MG tablet Take 2 tablets (80 mg total) by mouth 2 (two) times daily. 360 tablet 2  . gabapentin (NEURONTIN) 100 MG capsule Take 100 mg by mouth at bedtime.     . hydrALAZINE (APRESOLINE) 50 MG tablet Take 1 tablet (50 mg total) by mouth 3 (three) times daily. 270 tablet 1  . hydroxyurea (HYDREA) 500 MG capsule May take with food to minimize GI side effects. Take 2 capsules on Saturdays and Sundays and 1 capsule for the rest of the week 90 capsule 11  . levothyroxine (SYNTHROID, LEVOTHROID) 112 MCG tablet Take 112 mcg by mouth daily.    . Magnesium 250 MG  TABS Take 250 mg by mouth daily.     . Melatonin 10 MG TABS Take 10 mg by mouth at bedtime.    . Multiple Vitamins-Minerals (PRESERVISION AREDS 2) CAPS Take 2 capsules by mouth daily.     . polycarbophil (FIBERCON) 625 MG tablet Take 2 tablets (1,250 mg total) by mouth 2 (two) times daily. 60 tablet 0  . potassium chloride (K-DUR,KLOR-CON) 10 MEQ tablet Take 1 tablet (10 mEq total) by mouth 2 (two) times daily. 60 tablet 0  . Rivaroxaban (XARELTO) 15 MG TABS tablet Take 15 mg by mouth daily with supper.    . traMADol (ULTRAM) 50 MG tablet Take 50 mg by mouth at bedtime.     No current facility-administered medications for this visit.      Allergies  Allergen Reactions  . Amlodipine Swelling and Other (See Comments)    Reaction:  Lower extremity swelling     Social History   Social History  . Marital status: Married    Spouse name: [name]  . Number of children: 2  . Years of education: 16   Occupational History  . Not on file.   Social History Main Topics  . Smoking status: Former Smoker    Types: Cigarettes    Quit date: 01/19/1984  . Smokeless tobacco: Never Used  . Alcohol use 0.6 oz/week    1 Glasses of wine per week     Comment: 2 per day/ 14 a week  . Drug use: No  . Sexual activity: Not Currently   Other Topics Concern  . Not on file   Social History Narrative   Patient is Married 1955. 2 kids. 62 grandkid-40 years old in 2016. [employer] graduate [name]).    Lives in apartment,  Independent Living  section at [place] since 02/2013.      Stay at home mother.       No Smoking history, Mod. alcohol use.   Patient has a living will, POA      Hobbies: CPU and painting             Family History  Problem Relation Age of Onset  . Hypertension Father   . Heart disease Father     patient does not know details.   . Ovarian cancer Mother 57  . Cancer Sister     colon  . Heart disease Sister   . Cancer Sister   . Cancer Sister     hodgkin disease  . Breast cancer Daughter   . Cancer Daughter     breast    ROS-  All systems are reviewed and are negative except as outlined in the HPI above  Physical Exam: Vitals:   04/12/16 1401  BP: (!) 142/74  Pulse: 60  SpO2: 97%  Weight: 115 lb (52.2 kg)  Height: 5' (1.524 m)    GEN- The patient is well appearing, alert and oriented x 3 today.   Head- normocephalic, atraumatic Eyes-  Sclera clear, conjunctiva pink Ears- hearing intact Oropharynx- clear Neck- supple, no JVP Lungs- Clear to ausculation bilaterally, normal work of breathing Heart- Regular rate and rhythm, no murmurs, rubs or gallops, PMI not  laterally displaced GI- soft, NT, ND, + BS Extremities- no clubbing, cyanosis, or edema  ekg today reveals sinus rhythm 60 bpm with some junctional beats also noted, LVH, repolarization abnormality  Echo 08/05/15- EF 60%, mild MR, mild TR,  LA 32mm  Assessment and Plan:  1. Paroxysmal atrial fibrillation  Doing well s/p ablation In sinus rhythm today Recent review of ekgs reveal primarily sinus rhythm with some junctional beats (which on exam may sound like afib).  I have reviewed monitor from January which reveals ERAF.  Monitoring during the first three months post ablation is typically not beneficial as afib is expected during this time. Her chads2vasc score is at least 4.   CrCl 42.  She is on xarelto 15mg  daily appropriately.  Stop amiodarone today Continue close outpatient follow-up with me  2. Sick sinus syndrome Presently, her heart rates are stable. Hopefully  her heart rates may improve off of amiodarone and we could avoid PPM.  3. htn Stable No change required today  4. Chronic diastolic dysfunction Improved with sinus rhythm Will reduce lasix to 80mg  qam and 40mg  qpm (we had increased this when in persistent afib and overloaded back in December).  Return to see me in 6 weeks   Thompson Grayer MD, [facility] 04/12/2016

## 2016-04-12 NOTE — Patient Instructions (Addendum)
Medication Instructions:  Your physician has recommended you make the following change in your medication:  1) Stop Amiodarone 2) Decrease Furosemide to 80 mg in the morning and 40 mg in the pm   Labwork: None ordered   Testing/Procedures: None ordered   Follow-Up: Your physician recommends that you schedule a follow-up appointment in: 6 weeks with Dr Rayann Heman   Any Other Special Instructions Will Be Listed Below (If Applicable).     If you need a refill on your cardiac medications before your next appointment, please call your pharmacy.

## 2016-04-15 ENCOUNTER — Telehealth: Payer: Self-pay | Admitting: Pharmacist

## 2016-04-15 NOTE — Telephone Encounter (Signed)
Received request from [facility] that pt will be scheduled for a lumbar spine radiofrequency denervation procedure/ablation. She takes Xarelto for afib with CHADS2 score of 3 (age, HTN, and CHF) and CHADS2VASC score of 54 (age, sex, CAD, HTN, and CHF). Ok to hold Xarelto for 3 days prior to procedure. Clearance faxed to [facility] [phone].

## 2016-04-20 ENCOUNTER — Telehealth: Payer: Self-pay | Admitting: Internal Medicine

## 2016-04-20 MED ORDER — FENTANYL 25 MCG/HR TD PT72: 25.0000 ug | MEDICATED_PATCH | TRANSDERMAL | 0 refills | Status: DC

## 2016-04-20 NOTE — Addendum Note (Signed)
Addended by: Despina Hidden on: 04/20/2016 03:16 PM   Modules accepted: Orders

## 2016-04-20 NOTE — Telephone Encounter (Signed)
spoke with patient and advised rx will be at front desk at healthcare  rx handwritted out on script pad due to no printing can be done at [facility] with our computers

## 2016-04-20 NOTE — Telephone Encounter (Signed)
Patient called wanting a refill of her fentanyl patch. States that she usually picks it up from Dr. Mariea Clonts on Wednesdays from [place].   Would you like me to print this off for you or are you wanting to take care of it there ?

## 2016-04-21 ENCOUNTER — Other Ambulatory Visit: Payer: Self-pay | Admitting: Internal Medicine

## 2016-05-04 ENCOUNTER — Telehealth (HOSPITAL_COMMUNITY): Payer: Self-pay | Admitting: *Deleted

## 2016-05-04 ENCOUNTER — Other Ambulatory Visit: Payer: Self-pay | Admitting: Internal Medicine

## 2016-05-04 DIAGNOSIS — Z7289 Other problems related to lifestyle: Secondary | ICD-10-CM | POA: Diagnosis not present

## 2016-05-04 DIAGNOSIS — Z87891 Personal history of nicotine dependence: Secondary | ICD-10-CM | POA: Diagnosis not present

## 2016-05-04 DIAGNOSIS — H90A12 Conductive hearing loss, unilateral, left ear with restricted hearing on the contralateral side: Secondary | ICD-10-CM | POA: Diagnosis not present

## 2016-05-04 DIAGNOSIS — H918X1 Other specified hearing loss, right ear: Secondary | ICD-10-CM | POA: Diagnosis not present

## 2016-05-04 DIAGNOSIS — H6122 Impacted cerumen, left ear: Secondary | ICD-10-CM | POA: Diagnosis not present

## 2016-05-04 DIAGNOSIS — H938X2 Other specified disorders of left ear: Secondary | ICD-10-CM | POA: Diagnosis not present

## 2016-05-04 NOTE — Telephone Encounter (Signed)
Received note from [facility], pt needs clearance to be off Xarelto 2 days for an radiofrequency ablation.    Per Dr Aundra Dubin Madaline Brilliant to hold 2 days"  Note faxed back to them at [phone]

## 2016-05-05 ENCOUNTER — Ambulatory Visit (INDEPENDENT_AMBULATORY_CARE_PROVIDER_SITE_OTHER): Payer: Medicare Other | Admitting: Sports Medicine

## 2016-05-05 ENCOUNTER — Ambulatory Visit (INDEPENDENT_AMBULATORY_CARE_PROVIDER_SITE_OTHER): Payer: Medicare Other

## 2016-05-05 DIAGNOSIS — G894 Chronic pain syndrome: Secondary | ICD-10-CM

## 2016-05-05 DIAGNOSIS — M25511 Pain in right shoulder: Secondary | ICD-10-CM

## 2016-05-05 DIAGNOSIS — M47812 Spondylosis without myelopathy or radiculopathy, cervical region: Secondary | ICD-10-CM | POA: Diagnosis not present

## 2016-05-05 DIAGNOSIS — M19011 Primary osteoarthritis, right shoulder: Secondary | ICD-10-CM | POA: Diagnosis not present

## 2016-05-05 DIAGNOSIS — M50321 Other cervical disc degeneration at C4-C5 level: Secondary | ICD-10-CM | POA: Diagnosis not present

## 2016-05-05 DIAGNOSIS — M1120 Other chondrocalcinosis, unspecified site: Secondary | ICD-10-CM

## 2016-05-05 DIAGNOSIS — M109 Gout, unspecified: Secondary | ICD-10-CM

## 2016-05-05 DIAGNOSIS — D45 Polycythemia vera: Secondary | ICD-10-CM | POA: Diagnosis not present

## 2016-05-05 NOTE — Assessment & Plan Note (Signed)
On hydroxyurea.  Patient wished to defer blood draw today given she was getting trigger point injections.  Consider uric acid check at next follow-up.  May need allopurinol prophylaxis

## 2016-05-05 NOTE — Assessment & Plan Note (Signed)
Significant calcific change noted within the periarticular spaces consideration for crystal arthropathy including gout versus chondrocalcinosis/CPPD should be considered.  Note she is on hydroxyurea which does increase the risk of gout but this is a chronic medication for her.  Consider uric acid check with next blood draw

## 2016-05-05 NOTE — Assessment & Plan Note (Signed)
Once again noted chondrocalcinosis on exam.  Consider colchicine if persistent symptoms.

## 2016-05-05 NOTE — Patient Instructions (Signed)
It was great to meet you  Please increase your Gabapentin to 300mg  (3 Tablets) at night for the next 3-4 days.  If you are better it is okay to go back to 1 tablet at night.

## 2016-05-05 NOTE — Assessment & Plan Note (Signed)
On a fentanyl patch.  Close follow-up if persistent ongoing symptoms for this breakthrough.  Followed at [place] for facet blocks and RFA.

## 2016-05-05 NOTE — Assessment & Plan Note (Signed)
Patient does have underlying cervical spondylosis and degenerative change of the shoulder however this seems to be most likely related to trigger points within the periscapular region.  No true radicular component.  Trigger point injections performed today but would consider systemic corticosteroids versus further evaluation with MRI of the cervical spine if persistent symptoms.

## 2016-05-05 NOTE — Progress Notes (Signed)
OFFICE VISIT NOTE Juanda Bond. [place], [place] at [facility] [phone]  Sarah Mays - 83 y.o. female MRN [id number]  Date of birth: 1930/07/28  Visit Date: 05/05/2016  PCP: Hollace Kinnier, DO   Referred by: Gayland Curry, DO  SUBJECTIVE:   Chief Complaint  Patient presents with  . Neck Pain    Sx started x2 days ago. Denies any injury. Pain does radiate to RT posterior shoulder. Increased gabapentin from 100mg  to 300mg  last PM with relief until about 3am. Descriibes the pain as constant and dull.   HPI:  Acute onset of severe right sided shoulder and neck pain that radiated into the posterior upper back.  This kept her awake throughout the evening.  Gabapentin 200 mg was insufficient in relieving her symptoms and she subsequently follow this with 300 mg dose which did improve her symptoms.  She is feeling strongly better at this time.  She has pain with cervical range of motion as well as with overhead lifting.  She has multiple medical problems as outlined below.  ROS: Denies fevers, chills, recent weight gain or weight loss.  No night sweats. No significant nighttime awakenings due to this issue.  Otherwise per HPI.  HISTORY & PERTINENT PRIOR DATA:  No specialty comments available. She reports that she quit smoking about 32 years ago. Her smoking use included Cigarettes. She has never used smokeless tobacco. No results for input(s): HGBA1C, LABURIC in the last 8760 hours. Medications & Allergies reviewed per EMR Patient Active Problem List   Diagnosis Date Noted  . Acute pain of right shoulder 05/05/2016  . Spondylosis of cervical region without myelopathy or radiculopathy 05/05/2016  . Chondrocalcinosis 05/05/2016  . Chronic pain disorder 05/05/2016  . A-fib ([place]) 01/08/2016  . Closed fracture of shaft of right femur, initial encounter ([place]) 10/25/2015  . Status post-operative repair of hip fracture 10/25/2015  . Greater  trochanteric bursitis of right hip 09/29/2015  . Asymptomatic cholelithiasis 05/09/2015  . Essential hypertension 11/07/2014  . Hair loss 11/07/2014  . Bilateral leg edema 11/07/2014  . Constipation due to pain medication 11/06/2014  . Hypotension due to drugs 11/06/2014  . Chest pain 09/01/2014  . Osteoporosis 01/04/2014  . Gout 01/04/2014  . Headache 01/04/2014  . Hypothyroidism 11/19/2013  . Hypercalcemia 10/11/2013  . Insomnia 05/16/2013  . Lumbar back pain 05/16/2013  . Polycythemia vera ([place])   . Neck mass   . Senile osteoporosis   . Macular degeneration   . Sick sinus syndrome ([place]) 03/09/2012  . CAD (coronary artery disease) 12/27/2011  . Paroxysmal atrial fibrillation ([place]) 12/27/2011  . Chronic diastolic CHF (congestive heart failure) ([place]) 12/27/2011  . HTN (hypertension) 12/27/2011   Past Medical History:  Diagnosis Date  . Abnormal stress test    a. 11/2011 Ex MV: EF 80%, small, partially reversible anteroapical defect->mild ischemia vs attenuation-->med Rx.  [name] Kitchen Alopecia, unspecified   . Chronic diastolic CHF (congestive heart failure) ([place])    a. 11/2013 Echo: EF 60-65%, no rwma, mild TR, PASP 59mmHg.  . Closed fracture of lower end of radius with ulna   . Deafness    Left, s/p multiple surgeries  . Essential hypertension   . Gouty arthropathy, unspecified   . Hypothyroidism   . Insomnia, unspecified   . Macular degeneration    Left, s/p inj. tx  . Mitral valve disorders(424.0)   . Neck mass    right, w/u including MRI negative  .  PAF (paroxysmal atrial fibrillation) (HCC)    a. on amio (02/2012 mild obstruction on PFT's) and xarelto.  . Polycythemia vera(238.4)   . Pure hypercholesterolemia   . Senile osteoporosis    Reclast in the past  . Tricuspid valve disorders, specified as nonrheumatic    Family History  Problem Relation Age of Onset  . Hypertension Father   . Heart disease Father     patient does not know details.   . Ovarian cancer Mother  9  . Cancer Sister     colon  . Heart disease Sister   . Cancer Sister   . Cancer Sister     hodgkin disease  . Breast cancer Daughter   . Cancer Daughter     breast   Past Surgical History:  Procedure Laterality Date  . ABLATION  08/28/14   Back   . BREAST BIOPSY  06/18/1998   left  . BREAST BIOPSY    . ELECTROPHYSIOLOGIC STUDY N/A 01/08/2016   Procedure: Atrial Fibrillation Ablation;  Surgeon: Thompson Grayer, MD;  Location: [place] [facility];  Service: Cardiovascular;  Laterality: N/A;  . FEMUR IM NAIL Right 10/08/2015   Procedure: INTRAMEDULLARY (IM) NAIL FEMORAL RIGHT;  Surgeon: Paralee Cancel, MD;  Location: [facility];  Service: Orthopedics;  Laterality: Right;  . TONSILLECTOMY    . TOTAL ABDOMINAL HYSTERECTOMY    . TYMPANOPLASTY Bilateral    Social History   Occupational History  . Not on file.   Social History Main Topics  . Smoking status: Former Smoker    Types: Cigarettes    Quit date: 01/19/1984  . Smokeless tobacco: Never Used  . Alcohol use 0.6 oz/week    1 Glasses of wine per week     Comment: 2 per day/ 14 a week  . Drug use: No  . Sexual activity: Not Currently    OBJECTIVE:  VS:  HT:    WT:   BMI:     BP:   HR: bpm  TEMP: ( )  RESP:  PHYSICAL EXAM: General:   WDWN, NAD, Non-toxic appearing Psych:  Alert & appropriately interactive  Not depressed or anxious appearing Upper Extremities:  No significant rashes/lesions/ulcerations overlying the arms.  No significant generalized UE edema.  No clubbing or cyanosis.  Radial pulses 2+/4.  Sensation intact to light touch. Neck:   Well aligned, no significant torticollis.  She has a large pedunculated nontender cystic subcutaneous lesion along the lateral aspect of her right neck.  No significant midline tenderness.    TTP over the insertion of the trapezius and most focally sore over the insertion of the levator scapula.  Palpable trigger points appreciated. ROM: Flexion: 70 Extension: 60     Right Left  Rotation: 60 70  Sidebending: 25 30   NEURAL TENSION SIGNS Right Left  Brachial Plexus Squeeze: Mildly tender  nontender  Arm Squeeze Test:  Non-tender  Non-tender  Spurling's  Compression Test:  Negative/  No radiation  Negative/  No radiation  Lhermitte's  Compression test:   Negative/  No radiation  Negative/  No radiation   REFLEXES Right Left   UMN - Hoffman's  Negative/Normal  Negative/Normal   Right Shoulder:   Normal alignment. No significant deformity.   Nontender over the [facility] joint and clavicle.  Tender over the trapezius.  No pain with axial load and circumduction  Normal IR/ER strength  IMAGING & PROCEDURES: Dg Cervical Spine 2 Or 3 Views  Result Date: 05/05/2016 CLINICAL DATA:  Neck  and right shoulder pain over the last 2 days, no injury EXAM: CERVICAL SPINE - 2-3 VIEW COMPARISON:  None. FINDINGS: The cervical vertebrae are in normal alignment. There is diffuse degenerative disc disease from C3-C7 with loss of disc space and sclerosis with spurring, most prominently at C4-5, C5-6, and C6-7 levels. No prevertebral soft tissue swelling is seen. The odontoid process is not well seen. The lung apices appear clear. IMPRESSION: Normal alignment with diffuse degenerative disc disease from C3 to the C7 as noted above. Electronically Signed   By: Ivar Drape M.D.   On: 05/05/2016 15:11   Dg Shoulder Right  Result Date: 05/05/2016 CLINICAL DATA:  Shoulder pain for 2 days EXAM: RIGHT SHOULDER - 2+ VIEW COMPARISON:  None. FINDINGS: The right humeral head is in normal position and there is moderate degenerative joint disease of the right shoulder. No acute fracture is seen. There does appear to be faint chondrocalcinosis which may indicate CPPD arthropathy. The right [facility] joint is normally aligned. IMPRESSION: 1. Moderate degenerative joint disease of the right shoulder. 2. Question chondrocalcinosis which may indicate CPPD arthropathy. Electronically Signed   By:  Ivar Drape M.D.   On: 05/05/2016 15:13   +++++++++++++++++++++++++++++++++++++++++++++++++++++++++++++++++++++++++++++ PROCEDURE NOTE: Right shoulder trigger point injection  The patients clinical condition is marked by substantial pain and/or significant functional disability. Other conservative therapy has not provided relief, is contraindicated, or not appropriate. There is a reasonable likelihood that injection will significantly improve the patients pain and/or functional impairment.  After discussing the risks, benefits and expected outcomes of the injection and all questions were reviewed and answered, the patient wished to undergo the above named procedure.  Verbal consent was obtained. After an appropriate time out was taken the target structure prepped and aspirated and injected as below: PREP: Alcohol, Ethel Chloride APPROACH: Trapezius and levator scapular trigger points performed today.,  3 separate injections performed with a in equal distribution into each site of medications below.,  25-gauge 1 inch INJECTATE: 1 mL of 1% lidocaine, 1 mL of half percent Marcaine, 1 mL of 40 mg Depo-Medrol ASPIRATE: N/A DRESSING: Band-Aids Post procedural instructions including recommending icing and warning signs for infection were reviewed.  This procedure was well tolerated and there were no complications.    ASSESSMENT & PLAN:  Visit Diagnoses:  1. Gout of multiple sites, unspecified cause, unspecified chronicity   2. Acute pain of right shoulder   3. Spondylosis of cervical region without myelopathy or radiculopathy   4. Chondrocalcinosis   5. Polycythemia vera ([place])   6. Chronic pain disorder    Meds: No orders of the defined types were placed in this encounter.   Orders:  Orders Placed This Encounter  Procedures  . DG Cervical Spine 2 or 3 views  . DG Shoulder Right    Follow-up: Return if symptoms worsen or fail to improve.  Otherwise please see problem oriented charting as  below.

## 2016-05-05 NOTE — Assessment & Plan Note (Signed)
Okay to titrate gabapentin short-term.  Once less symptomatic encouraged her to go back to lower dose.

## 2016-05-06 DIAGNOSIS — M47816 Spondylosis without myelopathy or radiculopathy, lumbar region: Secondary | ICD-10-CM | POA: Diagnosis not present

## 2016-05-11 ENCOUNTER — Other Ambulatory Visit: Payer: Self-pay | Admitting: Internal Medicine

## 2016-05-11 DIAGNOSIS — H353221 Exudative age-related macular degeneration, left eye, with active choroidal neovascularization: Secondary | ICD-10-CM | POA: Diagnosis not present

## 2016-05-11 DIAGNOSIS — H353124 Nonexudative age-related macular degeneration, left eye, advanced atrophic with subfoveal involvement: Secondary | ICD-10-CM | POA: Diagnosis not present

## 2016-05-11 DIAGNOSIS — H353112 Nonexudative age-related macular degeneration, right eye, intermediate dry stage: Secondary | ICD-10-CM | POA: Diagnosis not present

## 2016-05-18 ENCOUNTER — Telehealth: Payer: Self-pay | Admitting: *Deleted

## 2016-05-18 NOTE — Telephone Encounter (Signed)
Patient called and stated that she is due for her Fentanyl Patch on Thursday. Patient would like to pick it up at [facility].   Would you write and leave at [facility] and notify patient when it will be ready.

## 2016-05-21 MED ORDER — FENTANYL 25 MCG/HR TD PT72: 25.0000 ug | MEDICATED_PATCH | TRANSDERMAL | 0 refills | Status: DC

## 2016-05-21 NOTE — Telephone Encounter (Signed)
Pt came to [facility] and picked up rx

## 2016-05-24 ENCOUNTER — Ambulatory Visit (INDEPENDENT_AMBULATORY_CARE_PROVIDER_SITE_OTHER): Payer: Medicare Other | Admitting: Internal Medicine

## 2016-05-24 VITALS — BP 120/76 | HR 102 | Ht 60.0 in | Wt 112.1 lb

## 2016-05-24 DIAGNOSIS — I5032 Chronic diastolic (congestive) heart failure: Secondary | ICD-10-CM | POA: Diagnosis not present

## 2016-05-24 DIAGNOSIS — I495 Sick sinus syndrome: Secondary | ICD-10-CM | POA: Diagnosis not present

## 2016-05-24 DIAGNOSIS — I480 Paroxysmal atrial fibrillation: Secondary | ICD-10-CM

## 2016-05-24 MED ORDER — POTASSIUM CHLORIDE CRYS ER 10 MEQ PO TBCR: 10.0000 meq | EXTENDED_RELEASE_TABLET | Freq: Every day | ORAL | 0 refills | Status: DC

## 2016-05-24 MED ORDER — FUROSEMIDE 40 MG PO TABS: ORAL_TABLET | ORAL | 2 refills | Status: DC

## 2016-05-24 NOTE — Progress Notes (Signed)
PCP:  Hollace Kinnier, DO Primary Cardiologist:  Dr Aundra Dubin  The patient presents today for electrophysiology followup.  She is doing well.  Her edema and SOB are resolved.  She is unaware of afib.   Today, she denies symptoms of chest pain, shortness of breath, orthopnea, PND, dizziness, presyncope, syncope, or neurologic sequela.  The patient feels that she is tolerating medications without difficulties and is otherwise without complaint today.   Past Medical History:  Diagnosis Date  . Abnormal stress test    a. 11/2011 Ex MV: EF 80%, small, partially reversible anteroapical defect->mild ischemia vs attenuation-->med Rx.  [name] Kitchen Alopecia, unspecified   . Chronic diastolic CHF (congestive heart failure) ([place])    a. 11/2013 Echo: EF 60-65%, no rwma, mild TR, PASP 14mmHg.  . Closed fracture of lower end of radius with ulna   . Deafness    Left, s/p multiple surgeries  . Essential hypertension   . Gouty arthropathy, unspecified   . Hypothyroidism   . Insomnia, unspecified   . Macular degeneration    Left, s/p inj. tx  . Mitral valve disorders(424.0)   . Neck mass    right, w/u including MRI negative  . PAF (paroxysmal atrial fibrillation) (HCC)    a. on amio (02/2012 mild obstruction on PFT's) and xarelto.  . Polycythemia vera(238.4)   . Pure hypercholesterolemia   . Senile osteoporosis    Reclast in the past  . Tricuspid valve disorders, specified as nonrheumatic    Past Surgical History:  Procedure Laterality Date  . ABLATION  08/28/14   Back   . BREAST BIOPSY  06/18/1998   left  . BREAST BIOPSY    . ELECTROPHYSIOLOGIC STUDY N/A 01/08/2016   Procedure: Atrial Fibrillation Ablation;  Surgeon: Thompson Grayer, MD;  Location: [place] [facility];  Service: Cardiovascular;  Laterality: N/A;  . FEMUR IM NAIL Right 10/08/2015   Procedure: INTRAMEDULLARY (IM) NAIL FEMORAL RIGHT;  Surgeon: Paralee Cancel, MD;  Location: [facility];  Service: Orthopedics;  Laterality: Right;  . TONSILLECTOMY    .  TOTAL ABDOMINAL HYSTERECTOMY    . TYMPANOPLASTY Bilateral     Current Outpatient Prescriptions  Medication Sig Dispense Refill  . cholecalciferol (VITAMIN D) 1000 UNITS tablet Take 2,000 Units by mouth daily.     . fentaNYL (DURAGESIC - DOSED MCG/HR) 25 MCG/HR patch Place 1 patch (25 mcg total) onto the skin every 3 (three) days. 10 patch 0  . fluticasone (FLONASE) 50 MCG/ACT nasal spray Place 2 sprays into both nostrils daily. 16 g 6  . furosemide (LASIX) 40 MG tablet Take 80 mg by mouth every morning and 40 mg by mouth every pm 360 tablet 2  . gabapentin (NEURONTIN) 100 MG capsule Take 300 mg by mouth at bedtime.     . hydroxyurea (HYDREA) 500 MG capsule May take with food to minimize GI side effects. Take 2 capsules on Saturdays and Sundays and 1 capsule for the rest of the week 90 capsule 11  . levothyroxine (SYNTHROID, LEVOTHROID) 112 MCG tablet TAKE 1 TABLET EACH DAY. 30 tablet 0  . Magnesium 250 MG TABS Take 250 mg by mouth daily.     . Melatonin 10 MG TABS Take 10 mg by mouth at bedtime.    . Multiple Vitamins-Minerals (PRESERVISION AREDS 2) CAPS Take 2 capsules by mouth daily.     . polycarbophil (FIBERCON) 625 MG tablet Take 2 tablets (1,250 mg total) by mouth 2 (two) times daily. 60 tablet 0  . potassium  chloride (K-DUR,KLOR-CON) 10 MEQ tablet Take 1 tablet (10 mEq total) by mouth 2 (two) times daily. 60 tablet 0  . Rivaroxaban (XARELTO) 15 MG TABS tablet Take 15 mg by mouth daily with supper.    . traMADol (ULTRAM) 50 MG tablet TAKE ONE TABLET AT BEDTIME. 30 tablet 0  . hydrALAZINE (APRESOLINE) 50 MG tablet Take 1 tablet (50 mg total) by mouth 3 (three) times daily. 270 tablet 1   No current facility-administered medications for this visit.     Allergies  Allergen Reactions  . Amlodipine Swelling and Other (See Comments)    Reaction:  Lower extremity swelling     Social History   Social History  . Marital status: Married    Spouse name: [name]  . Number of children: 2    . Years of education: 16   Occupational History  . Not on file.   Social History Main Topics  . Smoking status: Former Smoker    Types: Cigarettes    Quit date: 01/19/1984  . Smokeless tobacco: Never Used  . Alcohol use 0.6 oz/week    1 Glasses of wine per week     Comment: 2 per day/ 14 a week  . Drug use: No  . Sexual activity: Not Currently   Other Topics Concern  . Not on file   Social History Narrative   Patient is Married 1955. 2 kids. 74 grandkid-76 years old in 2016. College graduate [name]).    Lives in apartment,  Independent Living  section at [place] since 02/2013.      Stay at home mother.       No Smoking history, Mod. alcohol use.   Patient has a living will, POA      Hobbies: CPU and painting             Family History  Problem Relation Age of Onset  . Hypertension Father   . Heart disease Father     patient does not know details.   . Ovarian cancer Mother 80  . Cancer Sister     colon  . Heart disease Sister   . Cancer Sister   . Cancer Sister     hodgkin disease  . Breast cancer Daughter   . Cancer Daughter     breast    ROS-  All systems are reviewed and are negative except as outlined in the HPI above  Physical Exam: Vitals:   05/24/16 1447  BP: 120/76  Pulse: (!) 102  SpO2: 93%  Weight: 112 lb 2 oz (50.9 kg)  Height: 5' (1.524 m)    GEN- The patient is well appearing, alert and oriented x 3 today.   Head- normocephalic, atraumatic Eyes-  Sclera clear, conjunctiva pink Ears- hearing intact Oropharynx- clear,  Neck- supple, no JVP,  Large palpable mass over R neck, likely a lipoma.  She has discussed with Dr Wilburn Cornelia (ENT) and they have elected to leave it alone at this time. Lungs- Clear to ausculation bilaterally, normal work of breathing Heart- Regular rate and rhythm with frequent ectopy, no murmurs, rubs or gallops, PMI not laterally displaced GI- soft, NT, ND, + BS Extremities- no  clubbing, cyanosis, or edema  ekg today reveals sinus rhythm 102 bpm with  PACs, PVCs noted, LVh Echo 08/05/15- EF 60%, mild MR, mild TR,  LA 54mm  Assessment and Plan:  1. Paroxysmal atrial fibrillation Doing well s/p ablation off of amiodarone In sinus rhythm today  Her chads2vasc  score is at least 4.   CrCl 42.  She is on xarelto 15mg  daily appropriately.  Will check BMET, CBC today  2. Sick sinus syndrome Appears resolved off of amiodarone  3. htn Stable No change required today  4. Chronic diastolic dysfunction Tachycardic today Slightly dry on exam Will reduce lasix to 80mg  daily today, reduce KDur to 33meq daily Daily weights bmet, cbc today to evaluate for other causes of tachycardia  Return to see me in 3 months   Thompson Grayer MD, [facility] 05/24/2016

## 2016-05-24 NOTE — Patient Instructions (Addendum)
Medication Instructions:  Your physician has recommended you make the following change in your medication:  1) Take your Furosemide 80 mg daily 2) Take Potassium 10 meq daily  Weigh yourself daily and record  Labwork: Your physician recommends that you return for lab work today: CBC/BMP   Testing/Procedures: None ordered   Follow-Up: Your physician recommends that you schedule a follow-up appointment in: 3 months with Dr Rayann Heman   Any Other Special Instructions Will Be Listed Below (If Applicable).     If you need a refill on your cardiac medications before your next appointment, please call your pharmacy.

## 2016-05-25 LAB — CBC WITH DIFFERENTIAL/PLATELET
BASOS ABS: 0.1 10*3/uL (ref 0.0–0.2)
Basos: 1 %
EOS (ABSOLUTE): 0.2 10*3/uL (ref 0.0–0.4)
Eos: 1 %
Hematocrit: 41.6 % (ref 34.0–46.6)
Hemoglobin: 14.1 g/dL (ref 11.1–15.9)
IMMATURE GRANS (ABS): 0 10*3/uL (ref 0.0–0.1)
IMMATURE GRANULOCYTES: 0 %
LYMPHS: 11 %
Lymphocytes Absolute: 1.3 10*3/uL (ref 0.7–3.1)
MCH: 38.3 pg — ABNORMAL HIGH (ref 26.6–33.0)
MCHC: 33.9 g/dL (ref 31.5–35.7)
MCV: 113 fL — ABNORMAL HIGH (ref 79–97)
Monocytes Absolute: 1.1 10*3/uL — ABNORMAL HIGH (ref 0.1–0.9)
Monocytes: 10 %
NEUTROS PCT: 77 %
Neutrophils Absolute: 8.7 10*3/uL — ABNORMAL HIGH (ref 1.4–7.0)
PLATELETS: 581 10*3/uL — AB (ref 150–379)
RBC: 3.68 x10E6/uL — AB (ref 3.77–5.28)
RDW: 15.4 % (ref 12.3–15.4)
WBC: 11.3 10*3/uL — ABNORMAL HIGH (ref 3.4–10.8)

## 2016-05-25 LAB — BASIC METABOLIC PANEL
BUN/Creatinine Ratio: 24 (ref 12–28)
BUN: 22 mg/dL (ref 8–27)
CALCIUM: 10.7 mg/dL — AB (ref 8.7–10.3)
CHLORIDE: 99 mmol/L (ref 96–106)
CO2: 28 mmol/L (ref 18–29)
Creatinine, Ser: 0.91 mg/dL (ref 0.57–1.00)
GFR calc Af Amer: 67 mL/min/{1.73_m2} (ref >59)
GFR calc non Af Amer: 59 mL/min/{1.73_m2} — ABNORMAL LOW (ref >59)
Glucose: 82 mg/dL (ref 65–99)
Potassium: 4.6 mmol/L (ref 3.5–5.2)
Sodium: 141 mmol/L (ref 134–144)

## 2016-05-27 ENCOUNTER — Other Ambulatory Visit: Payer: Self-pay | Admitting: Internal Medicine

## 2016-05-27 ENCOUNTER — Encounter: Payer: Self-pay | Admitting: *Deleted

## 2016-06-02 ENCOUNTER — Encounter: Payer: Self-pay | Admitting: Internal Medicine

## 2016-06-03 ENCOUNTER — Encounter: Payer: Self-pay | Admitting: Internal Medicine

## 2016-06-04 ENCOUNTER — Encounter: Payer: Self-pay | Admitting: Internal Medicine

## 2016-06-04 ENCOUNTER — Ambulatory Visit (INDEPENDENT_AMBULATORY_CARE_PROVIDER_SITE_OTHER): Payer: Medicare Other | Admitting: Internal Medicine

## 2016-06-04 VITALS — BP 138/78 | HR 75 | Temp 98.6°F | Wt 113.0 lb

## 2016-06-04 DIAGNOSIS — I5032 Chronic diastolic (congestive) heart failure: Secondary | ICD-10-CM | POA: Diagnosis not present

## 2016-06-04 DIAGNOSIS — M810 Age-related osteoporosis without current pathological fracture: Secondary | ICD-10-CM

## 2016-06-04 DIAGNOSIS — M545 Low back pain, unspecified: Secondary | ICD-10-CM

## 2016-06-04 DIAGNOSIS — I480 Paroxysmal atrial fibrillation: Secondary | ICD-10-CM | POA: Diagnosis not present

## 2016-06-04 DIAGNOSIS — D170 Benign lipomatous neoplasm of skin and subcutaneous tissue of head, face and neck: Secondary | ICD-10-CM

## 2016-06-04 NOTE — Patient Instructions (Addendum)
Hydrocortisone cream 1%  To red areas on neck (dermatitis/eczema)  Check the list of surgeons with your daughter and let me know if you would like to see one of them about the lipoma.  Glad your heart and swelling is better.  Let me know if you need your fentanyl reduced.

## 2016-06-04 NOTE — Progress Notes (Signed)
Location:  [facility] clinic Provider:  Vahe Pienta L. Mariea Clonts, D.O., C.M.D.  Code Status: DNR Goals of Care:  Advanced Directives 06/04/2016  Does Patient Have a Medical Advance Directive? Yes  Type of Paramedic of [place];Living will  Does patient want to make changes to medical advance directive? -  Copy of [place] in Chart? Yes  Would patient like information on creating a medical advance directive? -  Pre-existing out of facility DNR order (yellow form or pink MOST form) -     Chief Complaint  Patient presents with  . Follow-up    discuss stopping pain patch, check neck    HPI: Patient is a 83 y.o. female seen today for medical management of chronic diseases.    Back pain was better almost immediately after her back ablation.  But came back after she called to come about coming off of the pain patch.  Still using tramadol at night and back on tylenol.    She has had her neck mass looked at before and her daughter did not want her put to sleep for the surgery--I'm not sure there is an alternative.  It has grown on the right side inferior and posterior to her ear.  When Dr. Rayann Heman saw her, he asked her to ask me about her neck mass removal.  We discussed the option of a general surgery consult b/c she had seen an ENT before.  Seems too deep for a derm procedure also.  Pafib:  Better post cardioversion.  Lasix has been reduced.  She is up just one lb since.  No shortness of breath.  Never really felt palpitations.  Feels fine from this perspective.  Past Medical History:  Diagnosis Date  . Abnormal stress test    a. 11/2011 Ex MV: EF 80%, small, partially reversible anteroapical defect->mild ischemia vs attenuation-->med Rx.  [name] Kitchen Alopecia, unspecified   . Chronic diastolic CHF (congestive heart failure) ([place])    a. 11/2013 Echo: EF 60-65%, no rwma, mild TR, PASP 21mmHg.  . Closed fracture of lower end of radius with ulna   . Deafness    Left,  s/p multiple surgeries  . Essential hypertension   . Gouty arthropathy, unspecified   . Hypothyroidism   . Insomnia, unspecified   . Macular degeneration    Left, s/p inj. tx  . Mitral valve disorders(424.0)   . Neck mass    right, w/u including MRI negative  . PAF (paroxysmal atrial fibrillation) (HCC)    a. on amio (02/2012 mild obstruction on PFT's) and xarelto.  . Polycythemia vera(238.4)   . Pure hypercholesterolemia   . Senile osteoporosis    Reclast in the past  . Tricuspid valve disorders, specified as nonrheumatic     Past Surgical History:  Procedure Laterality Date  . ABLATION  08/28/14   Back   . BREAST BIOPSY  06/18/1998   left  . BREAST BIOPSY    . ELECTROPHYSIOLOGIC STUDY N/A 01/08/2016   Procedure: Atrial Fibrillation Ablation;  Surgeon: Thompson Grayer, MD;  Location: [place] [facility];  Service: Cardiovascular;  Laterality: N/A;  . FEMUR IM NAIL Right 10/08/2015   Procedure: INTRAMEDULLARY (IM) NAIL FEMORAL RIGHT;  Surgeon: Paralee Cancel, MD;  Location: [facility];  Service: Orthopedics;  Laterality: Right;  . TONSILLECTOMY    . TOTAL ABDOMINAL HYSTERECTOMY    . TYMPANOPLASTY Bilateral     Allergies  Allergen Reactions  . Amlodipine Swelling and Other (See Comments)  Reaction:  Lower extremity swelling     Allergies as of 06/04/2016      Reactions   Amlodipine Swelling, Other (See Comments)   Reaction:  Lower extremity swelling      Medication List       Accurate as of 06/04/16 11:46 AM. Always use your most recent med list.          cholecalciferol 1000 units tablet Commonly known as:  VITAMIN D Take 2,000 Units by mouth daily.   fentaNYL 25 MCG/HR patch Commonly known as:  DURAGESIC - dosed mcg/hr Place 1 patch (25 mcg total) onto the skin every 3 (three) days.   fluticasone 50 MCG/ACT nasal spray Commonly known as:  FLONASE Place 2 sprays into both nostrils 2 (two) times daily.   furosemide 40 MG tablet Commonly known as:  LASIX Take 80  mg by mouth every morning   gabapentin 100 MG capsule Commonly known as:  NEURONTIN Take 300 mg by mouth at bedtime.   hydrALAZINE 50 MG tablet Commonly known as:  APRESOLINE Take 1 tablet (50 mg total) by mouth 3 (three) times daily.   hydroxyurea 500 MG capsule Commonly known as:  HYDREA May take with food to minimize GI side effects. Take 2 capsules on Saturdays and Sundays and 1 capsule for the rest of the week   levothyroxine 112 MCG tablet Commonly known as:  SYNTHROID, LEVOTHROID TAKE 1 TABLET EACH DAY.   Magnesium 250 MG Tabs Take 250 mg by mouth daily.   Melatonin 10 MG Tabs Take 10 mg by mouth at bedtime.   polycarbophil 625 MG tablet Commonly known as:  FIBERCON Take 2 tablets (1,250 mg total) by mouth 2 (two) times daily.   PRESERVISION AREDS 2 Caps Take 2 capsules by mouth daily.   Rivaroxaban 15 MG Tabs tablet Commonly known as:  XARELTO Take 15 mg by mouth daily with supper.   traMADol 50 MG tablet Commonly known as:  ULTRAM TAKE ONE TABLET AT BEDTIME.       Review of Systems:  Review of Systems  Constitutional: Negative for chills, fever, malaise/fatigue and weight loss.  HENT: Positive for hearing loss.        Lipoma on right side of neck inferoposterior to her right ear  Eyes: Negative for blurred vision.  Respiratory: Negative for cough and shortness of breath.   Cardiovascular: Negative for chest pain, palpitations, orthopnea, claudication, leg swelling and PND.  Genitourinary: Negative for dysuria.  Musculoskeletal: Positive for back pain. Negative for falls.  Neurological: Negative for dizziness, loss of consciousness and weakness.  Psychiatric/Behavioral: Negative for depression and memory loss.    Health Maintenance  Topic Date Due  . TETANUS/TDAP  08/03/1949  . INFLUENZA VACCINE  09/08/2016  . DEXA SCAN  Completed  . PNA vac Low Risk Adult  Completed    Physical Exam: Vitals:   06/04/16 1133  BP: 138/78  Pulse: 75  Temp:  98.6 F (37 C)  TempSrc: Oral  SpO2: 95%  Weight: 113 lb (51.3 kg)   Body mass index is 22.07 kg/m. Physical Exam  Constitutional: She is oriented to person, place, and time. She appears well-developed and well-nourished. No distress.  HENT:  Head: Normocephalic and atraumatic.  Neck: Neck supple. No JVD present. No tracheal deviation present. No thyromegaly present.  Mobile, growing mass on right side of neck inferoposterior to her right ear, nontender, no telangiectasias around it, no warmth, no drainage   Cardiovascular: Normal rate, regular rhythm, normal heart  sounds and intact distal pulses.   No edema  Pulmonary/Chest: Effort normal and breath sounds normal. No stridor. No respiratory distress.  Musculoskeletal:  Ambulating with her lightweight walker   Lymphadenopathy:    She has no cervical adenopathy.  Neurological: She is alert and oriented to person, place, and time.  Skin: Skin is warm and dry. Capillary refill takes less than 2 seconds.  Psychiatric: She has a normal mood and affect.    Labs reviewed: Basic Metabolic Panel:  Recent Labs  07/18/15 1506  02/05/16 1427 02/18/16 1514 05/24/16 1527  NA  --   < > 137 137 141  K  --   < > 3.7 3.8 4.6  CL  --   < > 98* 96* 99  CO2  --   < > 29 31 28   GLUCOSE  --   < > 91 94 82  BUN  --   < > 17 16 22   CREATININE  --   < > 0.76 0.86 0.91  CALCIUM  --   < > 10.1 10.7* 10.7*  TSH 2.21  --   --  1.232  --   < > = values in this interval not displayed. Liver Function Tests:  Recent Labs  06/20/15 0903 10/06/15 0408 02/18/16 1514  AST 17 18 18   ALT 11 18 14   ALKPHOS 72 49 49  BILITOT 0.7 0.6 0.9  PROT 6.2 6.5 6.7  ALBUMIN 4.6 4.3 4.7   No results for input(s): LIPASE, AMYLASE in the last 8760 hours. No results for input(s): AMMONIA in the last 8760 hours. CBC:  Recent Labs  12/22/15 1407 01/08/16 0546 01/09/16 0628 03/04/16 1416 05/24/16 1527  WBC 6.5 6.1 10.6* 7.4 11.3*  NEUTROABS 4,550  --    --  4.8 8.7*  HGB 13.3 12.8 11.2* 13.2  --   HCT 40.9 39.2 34.9* 39.9 41.6  MCV 113.6* 113.3* 115.9* 117.0* 113*  PLT 388 340 254 358 581*   Lipid Panel:  Recent Labs  07/29/15 1200  CHOL 162  HDL 101*  LDLCALC 42  TRIG 100   No results found for: HGBA1C  Procedures since last visit: Dg Cervical Spine 2 Or 3 Views  Result Date: 05/05/2016 CLINICAL DATA:  Neck and right shoulder pain over the last 2 days, no injury EXAM: CERVICAL SPINE - 2-3 VIEW COMPARISON:  None. FINDINGS: The cervical vertebrae are in normal alignment. There is diffuse degenerative disc disease from C3-C7 with loss of disc space and sclerosis with spurring, most prominently at C4-5, C5-6, and C6-7 levels. No prevertebral soft tissue swelling is seen. The odontoid process is not well seen. The lung apices appear clear. IMPRESSION: Normal alignment with diffuse degenerative disc disease from C3 to the C7 as noted above. Electronically Signed   By: Ivar Drape M.D.   On: 05/05/2016 15:11   Dg Shoulder Right  Result Date: 05/05/2016 CLINICAL DATA:  Shoulder pain for 2 days EXAM: RIGHT SHOULDER - 2+ VIEW COMPARISON:  None. FINDINGS: The right humeral head is in normal position and there is moderate degenerative joint disease of the right shoulder. No acute fracture is seen. There does appear to be faint chondrocalcinosis which may indicate CPPD arthropathy. The right [facility] joint is normally aligned. IMPRESSION: 1. Moderate degenerative joint disease of the right shoulder. 2. Question chondrocalcinosis which may indicate CPPD arthropathy. Electronically Signed   By: Ivar Drape M.D.   On: 05/05/2016 15:13    Assessment/Plan 1. Lumbar back pain -was  better right after ablation this time, but then pain returned between calling for the appt to see me and actually seeing me today so cont fentanyl with tramadol at hs and tylenol otherwise as needed for breakthrough pain  2. Paroxysmal atrial fibrillation (HCC) -doing better from  this perspective, back in NSR and no symptoms of dizziness, weakness  3. Senile osteoporosis -continues on prolia injections twice a year, vitamin D  4. Chronic diastolic CHF (congestive heart failure) (HCC) -stable also, weight up only one lb, being closely monitored by pt and cardiology  5. Lipoma of neck -will refer to general surgery instead of ENT to see if it is possible to remove this lipoma (suspect) w/o GA--ENT was going to put her under for it -will await pt's decision as to which surgeon she'd like to see, list was printed for her today  Labs/tests ordered:  No orders of the defined types were placed in this encounter.   Next appt:  07/07/2016   Arista Kettlewell L. Giovonnie Trettel, D.O. [facility] [address], [place] [address] Cell Phone (Mon-Fri 8am-5pm):  [phone] On Call:  [phone] & follow prompts after 5pm & weekends Office Phone:  [phone] Office Fax:  [phone]

## 2016-06-11 ENCOUNTER — Other Ambulatory Visit: Payer: Self-pay | Admitting: Internal Medicine

## 2016-06-12 IMAGING — CR DG LUMBAR SPINE COMPLETE 4+V
5 series · 5 of 5 positions shown · non-contrast
Comparison: Lumbar spine films of 09/30/2008

CLINICAL DATA: Low back pain for 1 week, no acute injury

EXAM:
LUMBAR SPINE - COMPLETE 4+ VIEW

[view not recorded (1 of 5)]
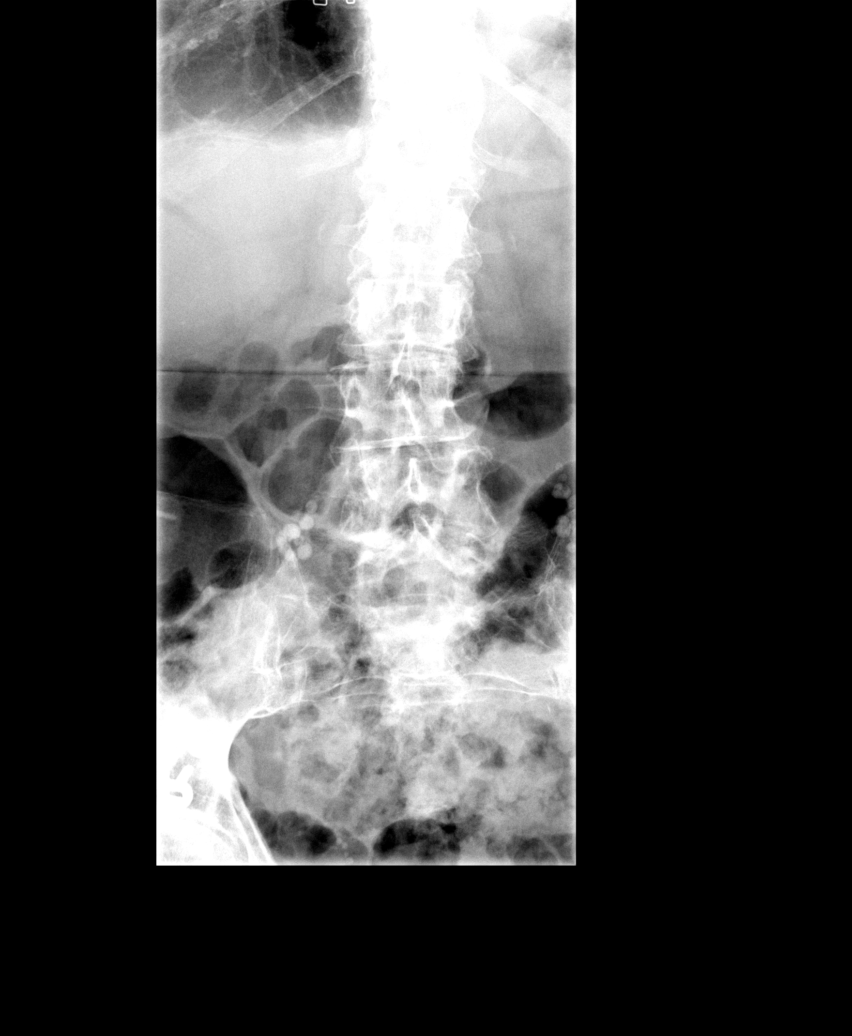

[view not recorded (2 of 5)]
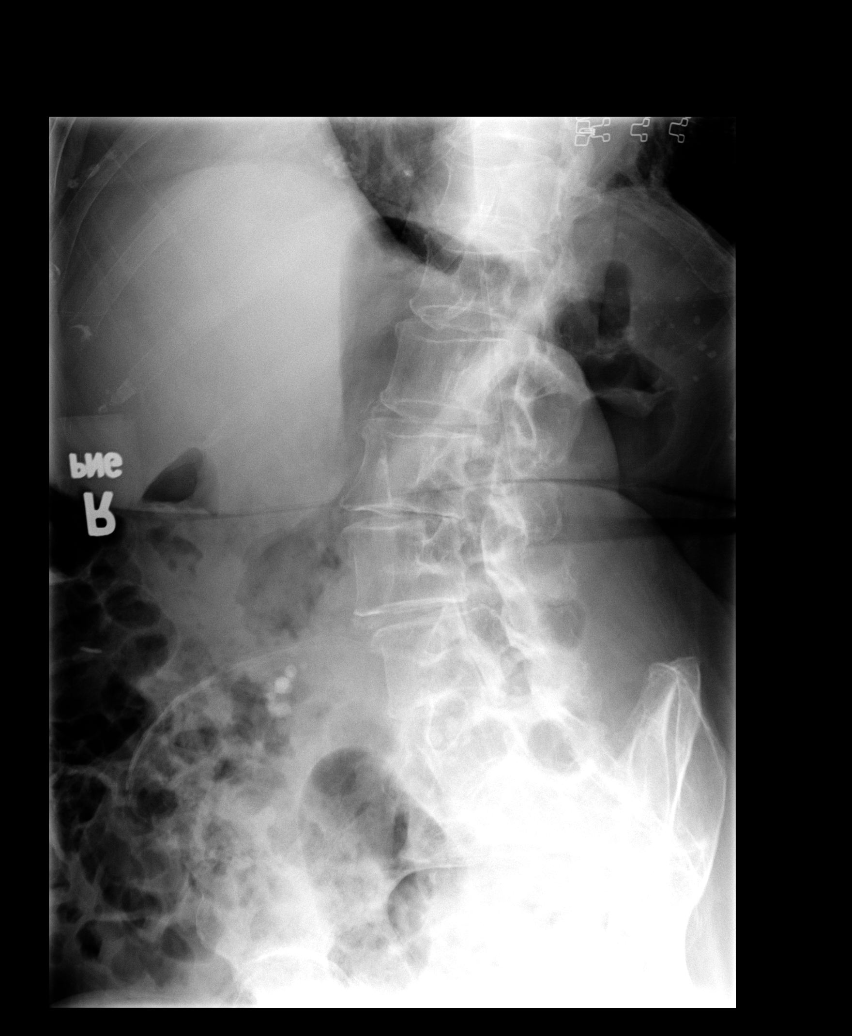

[view not recorded (3 of 5)]
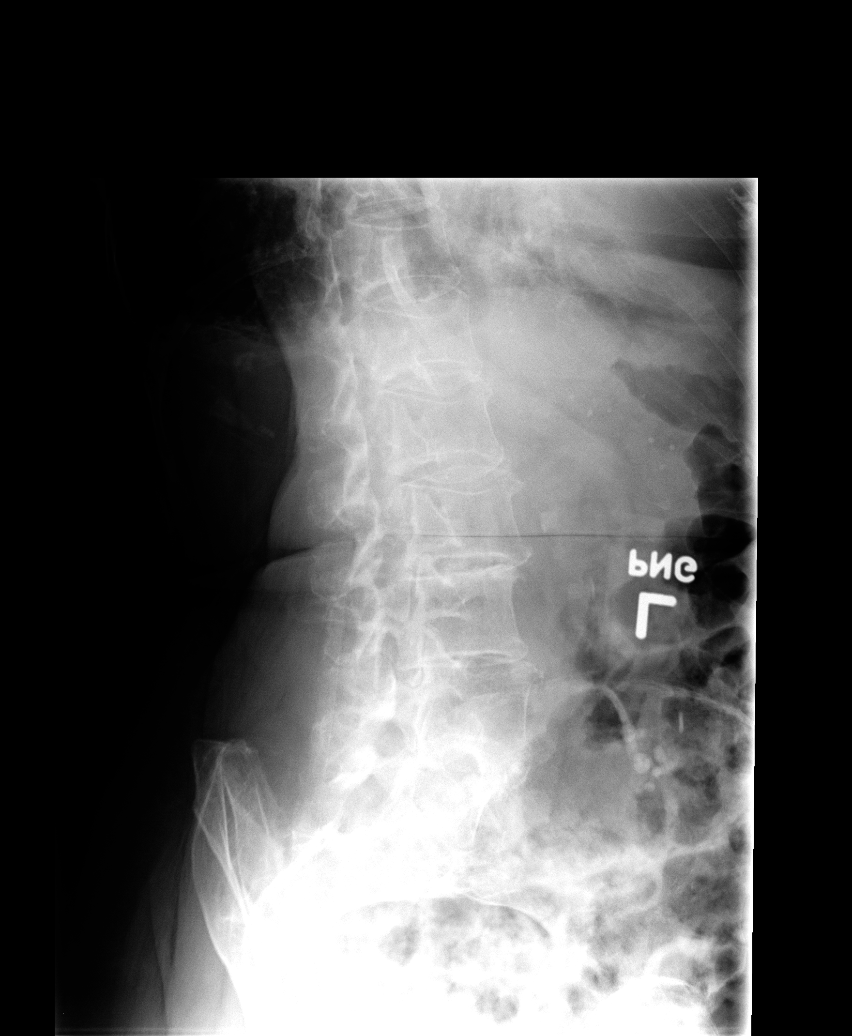

[view not recorded (4 of 5)]
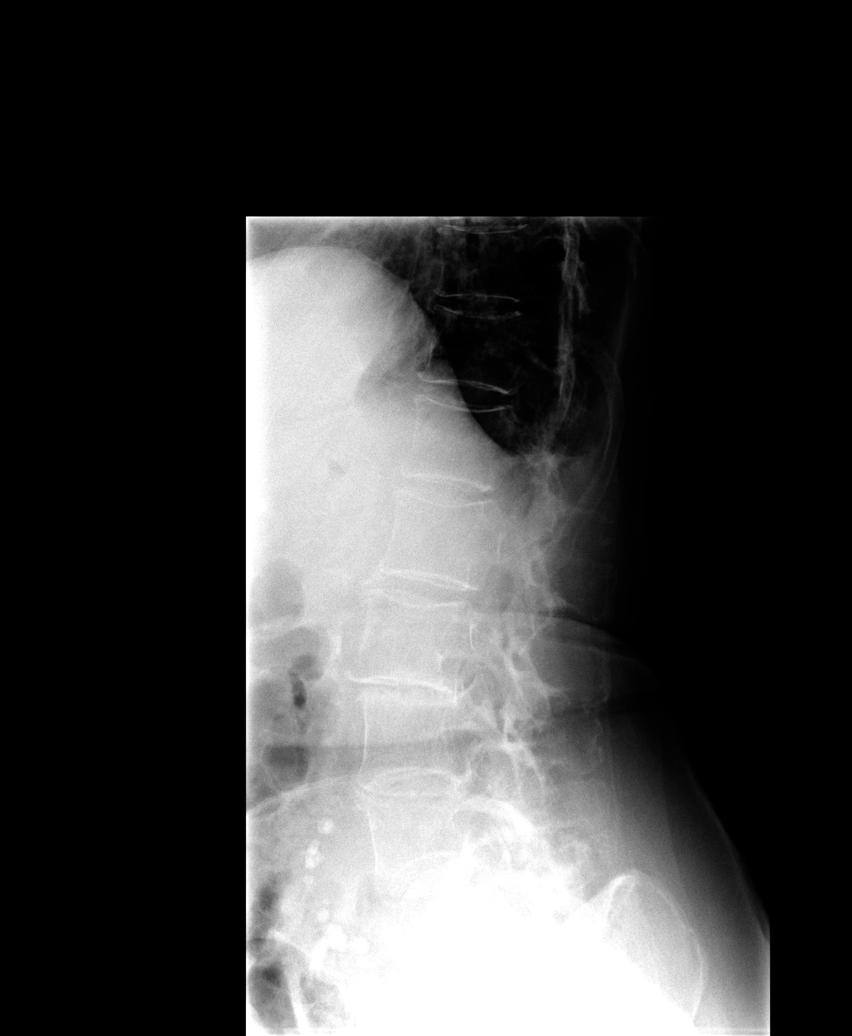

[view not recorded (5 of 5)]
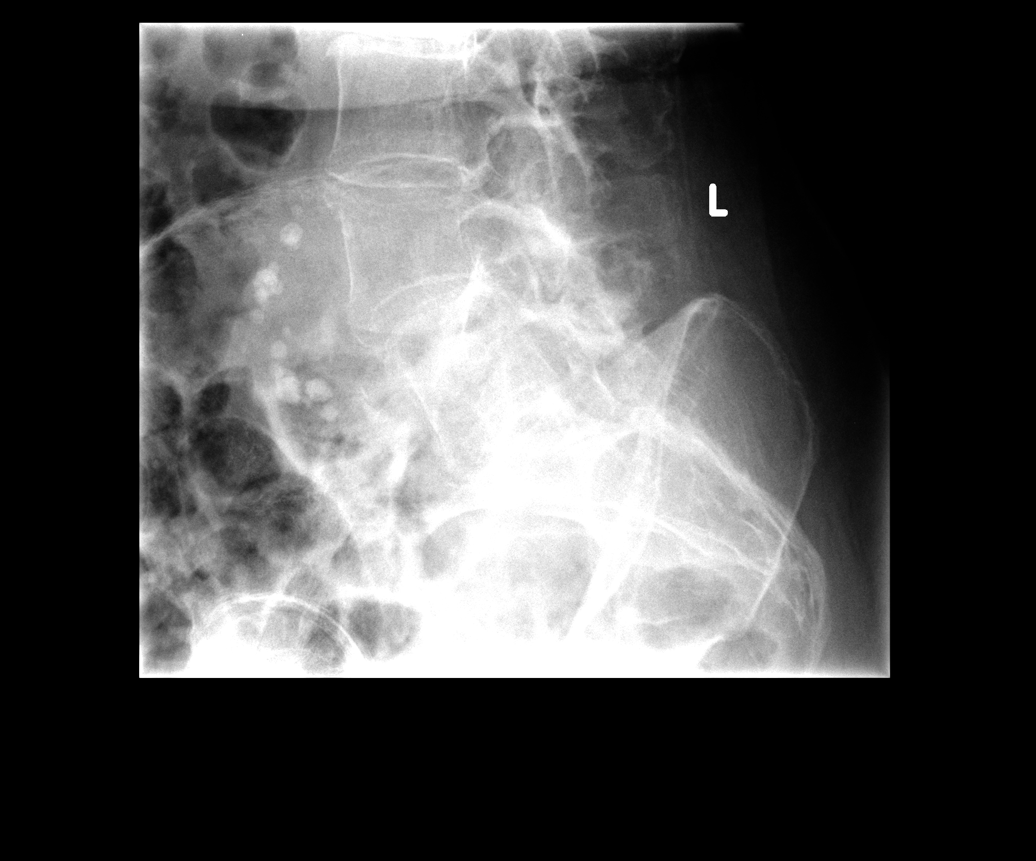

[5 of 5 positions shown; findings below may reference images not displayed]

FINDINGS: There is mild curvature of the lumbar spine convex to the right by
approximately 11 degrees. There has been progression of degenerative
disc disease at L2-3 where there is more loss of intervertebral disc
space with sclerosis and spurring. No compression deformity is seen.
Probable calcified lymph nodes are noted to the right of L5-S1.
IMPRESSION: 1. Progression of degenerative disc disease at L2-3.
2. Mild curvature of the lumbar spine convex to the right

## 2016-06-18 ENCOUNTER — Other Ambulatory Visit: Payer: Self-pay

## 2016-06-18 MED ORDER — FENTANYL 25 MCG/HR TD PT72: 25.0000 ug | MEDICATED_PATCH | TRANSDERMAL | 0 refills | Status: DC

## 2016-06-25 ENCOUNTER — Other Ambulatory Visit: Payer: Self-pay | Admitting: *Deleted

## 2016-06-25 DIAGNOSIS — M47816 Spondylosis without myelopathy or radiculopathy, lumbar region: Secondary | ICD-10-CM | POA: Diagnosis not present

## 2016-06-25 MED ORDER — TRAMADOL HCL 50 MG PO TABS: 50.0000 mg | ORAL_TABLET | Freq: Every day | ORAL | 0 refills | Status: DC

## 2016-06-25 NOTE — Telephone Encounter (Signed)
[facility]  

## 2016-06-28 ENCOUNTER — Encounter (HOSPITAL_COMMUNITY): Payer: Self-pay

## 2016-06-28 ENCOUNTER — Ambulatory Visit (HOSPITAL_COMMUNITY)
Admission: RE | Admit: 2016-06-28 | Discharge: 2016-06-28 | Disposition: A | Discharge status: Home / Self Care | Payer: Medicare Other | Source: Ambulatory Visit | Attending: Cardiology | Admitting: Cardiology

## 2016-06-28 VITALS — BP 164/78 | HR 85 | Ht 60.0 in | Wt 113.2 lb

## 2016-06-28 DIAGNOSIS — I5032 Chronic diastolic (congestive) heart failure: Secondary | ICD-10-CM | POA: Diagnosis not present

## 2016-06-28 DIAGNOSIS — I2510 Atherosclerotic heart disease of native coronary artery without angina pectoris: Secondary | ICD-10-CM | POA: Diagnosis not present

## 2016-06-28 DIAGNOSIS — I110 Hypertensive heart disease with heart failure: Secondary | ICD-10-CM | POA: Insufficient documentation

## 2016-06-28 DIAGNOSIS — I480 Paroxysmal atrial fibrillation: Secondary | ICD-10-CM | POA: Insufficient documentation

## 2016-06-28 DIAGNOSIS — R001 Bradycardia, unspecified: Secondary | ICD-10-CM | POA: Insufficient documentation

## 2016-06-28 DIAGNOSIS — I495 Sick sinus syndrome: Secondary | ICD-10-CM

## 2016-06-28 DIAGNOSIS — E039 Hypothyroidism, unspecified: Secondary | ICD-10-CM | POA: Insufficient documentation

## 2016-06-28 DIAGNOSIS — Z87891 Personal history of nicotine dependence: Secondary | ICD-10-CM | POA: Insufficient documentation

## 2016-06-28 DIAGNOSIS — I5033 Acute on chronic diastolic (congestive) heart failure: Secondary | ICD-10-CM | POA: Diagnosis not present

## 2016-06-28 MED ORDER — HYDRALAZINE HCL 50 MG PO TABS: 75.0000 mg | ORAL_TABLET | Freq: Three times a day (TID) | ORAL | 3 refills | Status: DC

## 2016-06-28 NOTE — Patient Instructions (Addendum)
INCREASE Hydralazine to 75mg  (1 & 1/2 tablets) three times daily.  Follow up with Dr.McLean in 6 months.

## 2016-06-29 NOTE — Progress Notes (Signed)
Patient ID: Sarah Mays, female   DOB: 07-03-1930, 83 y.o.   MRN: [id number] PCP: Dr Hollace Kinnier Cardiology: Dr. Aundra Dubin  83 yo with paroxysmal atrial fibrillation and chronic diastolic CHF returns for cardiology evaluation.  In 7/16, she was admitted with chest pain.  Cardiolite showed no ischemia or infarction.  Echo in 6/17 showed EF 60-65%.  She developed problems elevated HR when in atrial fibrillation but bradycardia when in NSR.  She underwent atrial fibrillation ablation in 11/17.  She is tolerating Xarelto without melena or BRBPR.  Dr. Rayann Heman had her stop amiodarone.    Today, she is in NSR.  She has not felt recent palpitations.  Uses walker because of back pain.  No significant exertional dyspnea though not very active.  Overall feels "ok."  No chest pain.  No orthopnea/PND.  BP is running high.   ECG (personally reviewed): NSR, nonspecific inferior T wave inversions, mildly prolonged QTc 483 msec.    Labs (7/13): K 4.7, creatinine 0.56 Labs (8/13): K 5.4, creatinine 0.69 Labs (11/13): TSH 6.45, K 5.4=>3.8, creatinine 1.5=>1.2, BUN 70=>35, HCT 33.2, BNP 866, AST 25, ALT 36 Labs (12/13): K 3.5 => 2.8, creatinine 1.2 => 1.33, BUN 56 Labs (1/14): LDL 85, HDL 86, K 3.9, creatinine 1.0, BNP 447 Labs (4/14): K 4.1, creatinine 0.9, BNP 293, LFTs normal, TSH normal Labs (8/14): K 4.3, creatinine 1.2, LFTs normal, TSH normal Labs (12/14): LFTs normal, TSH normal Labs (3/15): K 4.5, creatinine 1.3, HCT 36.2 Labs (10/15): K 5.1, creatinine 0.9, LFTs normal, TSH normal, pBNP 1893 Labs (3/16): K 3.9, creatinine 1.03, HCT 36.9, LFTs normal Labs (7/16): K 4, creatinine 0.94, calcium 10.8, LFTs normal Labs (9/16): K 4, creatinine 0.9, LDL 69, TSH normal, HCT 42.3 Labs (5/17): K 4.6, creatinine 0.94, HCT 41.5, LFTs normal. Labs (6/17): K 5, creatinine 1.04, LDL 42, HDL 101, TSH normal Labs (12/17): K 3.7, creatinine 0.76, hgb 11.2 Labs (4/18): K 4.6, creatinine 0.91  PMH: 1. Atrial  fibrillation: Diagnosed initially in 10/13.  Holter monitor in 11/13 showed atrial fibrillation with average rate 65.  - Atrial fibrillation ablation in 11/17.  2. Chronic diastolic CHF: Echo (29/51) with EF 65-70%, mild MR, moderate biatrial enlargement, moderate TR, PA systolic pressure 45 mmHg.   Echo (10/15): EF 60-65%.  Echo (7/16) with EF 60-65%, mild MR.  - Echo (6/17): EF 60-65%, PASP 41 mmHg.  3. ETT-Sestamibi (11/13): Exercised to stage II, small partially reversible anteroapical perfusion defect suggesting mild ischemia versus attenuation, EF 80%.  Cardiolite (7/16) with EF 64%, no ischemia or infarction.  4. HTN: Lower extremity swelling with amlodipine.  5. Polycythemia vera: Has had phlebotomy only once.  6. Hypothyroidism 7. Macular degeneration 8. TAH 1980 9. Familial hypocalciuric hypercalcemia 10. PFTs (amiodarone use) in 1/14 showed a mild obstructive defect.  11. Degenerative disc disease 12. Sick sinus syndrome: Has not required PPM.   SH: Prior smoker (years ago).  Married, lived in [place] but now at [place]  Occasional ETOH.  Daughter works for [employer].   FH: CAD  ROS: All systems reviewed and negative except as per HPI.   Current Outpatient Prescriptions  Medication Sig Dispense Refill  . cholecalciferol (VITAMIN D) 1000 UNITS tablet Take 2,000 Units by mouth daily.     . fentaNYL (DURAGESIC - DOSED MCG/HR) 25 MCG/HR patch Place 1 patch (25 mcg total) onto the skin every 3 (three) days. 10 patch 0  . fluticasone (FLONASE) 50 MCG/ACT nasal spray Place 2 sprays into both  nostrils 2 (two) times daily.    . furosemide (LASIX) 40 MG tablet Take 80 mg by mouth every morning 180 tablet 2  . gabapentin (NEURONTIN) 100 MG capsule Take 300 mg by mouth at bedtime.     . hydrALAZINE (APRESOLINE) 50 MG tablet Take 1.5 tablets (75 mg total) by mouth 3 (three) times daily. 135 tablet 3  . hydroxyurea (HYDREA) 500 MG capsule May take with food to minimize GI side  effects. Take 2 capsules on Saturdays and Sundays and 1 capsule for the rest of the week 90 capsule 11  . levothyroxine (SYNTHROID, LEVOTHROID) 112 MCG tablet TAKE 1 TABLET EACH DAY. 30 tablet 5  . Magnesium 250 MG TABS Take 250 mg by mouth daily.     . Melatonin 10 MG TABS Take 10 mg by mouth at bedtime.    . Multiple Vitamins-Minerals (PRESERVISION AREDS 2) CAPS Take 2 capsules by mouth daily.     . polycarbophil (FIBERCON) 625 MG tablet Take 2 tablets (1,250 mg total) by mouth 2 (two) times daily. 60 tablet 0  . Rivaroxaban (XARELTO) 15 MG TABS tablet Take 15 mg by mouth daily with supper.    . traMADol (ULTRAM) 50 MG tablet Take 1 tablet (50 mg total) by mouth at bedtime. 30 tablet 0   No current facility-administered medications for this encounter.     BP (!) 164/78   Pulse 85   Ht 5' (1.524 m)   Wt 113 lb 4 oz (51.4 kg)   SpO2 95%   BMI 22.12 kg/m  General: NAD Neck: JVP not elevated, no thyromegaly or thyroid nodule.  Lungs: Clear to auscultation bilaterally.  CV: Nondisplaced PMI.  Heart regular S1/S2, no S3/S4, 1/6 early SEM.  No edema.  No carotid bruit.  Abdomen: Soft, nontender, no hepatosplenomegaly, no distention.  Neurologic: Alert and oriented x 3.  Psych: Normal affect. Extremities: No clubbing or cyanosis.   Assessment/Plan:  1. Atrial fibrillation: First noted in 10/13.  Atrial fibrillation has triggered acute on chronic diastolic CHF in the past.  CHADSVASC score is 50 (age, female gender, HTN, CHF).  She is anticoagulated with Xarelto 15 mg daily which is appropriate for her creatinine clearance (reassessed today).  She had atrial fibrillation ablation in 11/17, seems to have mainly been in NSR since then.  She is now off amiodarone.   - Continue Xarelto.  - No nodal blockers at this time given sick sinus syndrome.  2. CAD: No chest pain.  7/16 Cardiolite showed no ischemia or infarction.  3. Chronic diastolic CHF:  NYHA class II symptoms currently. Volume status  improved on current Lasix dosing. - Continue Lasix 80 mg daily (has not needed as high a dose of Lasix when in NSR).  4. HTN: BP elevated, increase hydralazine to 75 mg tid.    5. Bradycardia: Suspect she has a degree of sick sinus syndrome.  HR ok today, off nodal blockers.   6. Hypercalcemia: Mild.  Has diagnosis of familial hypocalciuric hypercalcemia.   Followup 6 months.   Loralie Champagne 06/29/2016

## 2016-06-30 ENCOUNTER — Other Ambulatory Visit: Payer: Self-pay | Admitting: Hematology and Oncology

## 2016-06-30 DIAGNOSIS — D45 Polycythemia vera: Secondary | ICD-10-CM

## 2016-07-01 ENCOUNTER — Ambulatory Visit: Payer: Medicare Other | Admitting: Hematology and Oncology

## 2016-07-01 ENCOUNTER — Other Ambulatory Visit: Payer: Medicare Other

## 2016-07-01 ENCOUNTER — Encounter: Payer: Self-pay | Admitting: Hematology and Oncology

## 2016-07-01 DIAGNOSIS — M47816 Spondylosis without myelopathy or radiculopathy, lumbar region: Secondary | ICD-10-CM | POA: Diagnosis not present

## 2016-07-02 ENCOUNTER — Telehealth: Payer: Self-pay | Admitting: Hematology and Oncology

## 2016-07-02 NOTE — Telephone Encounter (Signed)
Patient called the morning to reschedule her appointment from yesterday... She had a procedure on her back and was not able to make the appointment.  The next available appointment is 6/18 at 9 am and she need after noon  The next after noon is 7/10 and didn't want to wait that long to see you.

## 2016-07-06 NOTE — Telephone Encounter (Signed)
I placed scheduling msg 

## 2016-07-06 NOTE — Telephone Encounter (Signed)
"  I called last week and have not heard anything yet.  I missed my appointment due to having to have a procedure done to my back.  I was not able to make it there in time or call to reschedule.  I do not want to wait to be seen with what I was told is available."    Advised a routine scheduling request has been sent by Dr. Alvy Bimler.  A scheduler will call.

## 2016-07-07 ENCOUNTER — Encounter: Payer: Medicare Other | Admitting: Internal Medicine

## 2016-07-08 ENCOUNTER — Telehealth: Payer: Self-pay | Admitting: Hematology and Oncology

## 2016-07-08 NOTE — Telephone Encounter (Signed)
Spoke with patient re 6/19 lab/fu.

## 2016-07-12 ENCOUNTER — Ambulatory Visit: Payer: Self-pay

## 2016-07-12 ENCOUNTER — Ambulatory Visit (INDEPENDENT_AMBULATORY_CARE_PROVIDER_SITE_OTHER): Payer: Medicare Other | Admitting: Sports Medicine

## 2016-07-12 ENCOUNTER — Encounter: Payer: Self-pay | Admitting: Sports Medicine

## 2016-07-12 VITALS — BP 122/80 | HR 99 | Ht 60.0 in | Wt 112.4 lb

## 2016-07-12 DIAGNOSIS — M25511 Pain in right shoulder: Secondary | ICD-10-CM | POA: Diagnosis not present

## 2016-07-12 DIAGNOSIS — M47812 Spondylosis without myelopathy or radiculopathy, cervical region: Secondary | ICD-10-CM | POA: Diagnosis not present

## 2016-07-12 NOTE — Assessment & Plan Note (Addendum)
Marked trigger points on exam today.  She does have underlying cervical spondylosis is focally sore over the rhomboids and levator scapula.  She had good relief following the prior trigger point injections and we will repeat these today.  We can consider referral to physical therapy if any intermediate improvement she can call if she is interested in this.  Patient is at [place] would like to come here for therapy if possible if it is indicated.  ++++++++++++++++++++++++++++++++++++++++++++ PROCEDURE NOTE -  ULTRASOUND GUIDEDINJECTION: Right Trigger Point injections Images were obtained and interpreted by myself, Teresa Coombs, DO  Images have been saved and stored to PACS system. Images obtained on: GE S7 Ultrasound machine  ULTRASOUND FINDINGS: N/a  DESCRIPTION OF PROCEDURE:  The patient's clinical condition is marked by substantial pain and/or significant functional disability. Other conservative therapy has not provided relief, is contraindicated, or not appropriate. There is a reasonable likelihood that injection will significantly improve the patient's pain and/or functional impairment. After discussing the risks, benefits and expected outcomes of the injection and all questions were reviewed and answered, the patient wished to undergo the above named procedure. Verbal consent was obtained. The ultrasound was used to identify the target structure and adjacent neurovascular structures. The skin was then prepped in sterile fashion and the target structure was injected under direct visualization using sterile technique as below: PREP: Alcohol, Ethel Chloride APPROACH: Single Injection, 25g 1.5" needle INJECTATE: 2 cc 0.5% marcaine, 1 cc 40mg  DepoMedrol ASPIRATE: N/A DRESSING: Band-Aids  Trigger points were palpated and ultrasound was used to visualize lung fields to remain out of them.  Care was taken with needle placement and myotonic twitch was noted prior to injection of 1 cc into all  3 trigger points noted.  Post procedural instructions including recommending icing and warning signs for infection were reviewed. This procedure was well tolerated and there were no complications.   IMPRESSION: Succesful Trigger Point Injections  Of note ultrasound guidance was not charged for this procedure given use only for educational purposes with Faustino Congress, DPT being present for trigger point injections

## 2016-07-12 NOTE — Progress Notes (Signed)
OFFICE VISIT NOTE Juanda Bond. [place], [place] at [facility] [phone]  GENNAVIEVE HUQ - 83 y.o. female MRN [id number]  Date of birth: 01/20/1931  Visit Date: 07/12/2016  PCP: Gayland Curry, DO   Referred by: Gayland Curry, DO  Burlene Arnt, CMA acting as scribe for Dr. Paulla Fore.  SUBJECTIVE:   Chief Complaint  Patient presents with  . pain in right shoulder   HPI: As below and per problem based documentation when appropriate.  Pt presents today in follow-up of right shoulder pain. She had xray of the right shoulder and c-spine 05/05/16.  Pt received a steroid injection 05/05/16. Pt did get relief with the injection. Pt reports that her pain in a little better today than it was yesterday because her daughter gave her a massage. She has also been putting "deep blue rub" and Tylenol with minimal relief. She has tried heat therapy with minimal relief. Pain is currently about 5/10. Pt reports that she takes a flexibility class and after the class the pain seems to be a little worse.   Pt denies fever, chills, night sweats, unintentional weight loss or gain.     Review of Systems  Constitutional: Negative for chills and fever.  Respiratory: Negative for shortness of breath and wheezing.   Cardiovascular: Negative for chest pain, palpitations and leg swelling.  Musculoskeletal: Positive for neck pain. Negative for falls.  Neurological: Negative for dizziness, tingling and headaches.  Endo/Heme/Allergies: Does not bruise/bleed easily.    Otherwise per HPI.  HISTORY & PERTINENT PRIOR DATA:  No specialty comments available. She reports that she quit smoking about 32 years ago. Her smoking use included Cigarettes. She has never used smokeless tobacco. No results for input(s): HGBA1C, LABURIC in the last 8760 hours. Medications & Allergies reviewed per EMR Patient Active Problem List   Diagnosis Date Noted  . Trigger point of right  shoulder region 05/05/2016  . Spondylosis of cervical region without myelopathy or radiculopathy 05/05/2016  . Chondrocalcinosis 05/05/2016  . Chronic pain disorder 05/05/2016  . A-fib ([place]) 01/08/2016  . Closed fracture of shaft of right femur, initial encounter ([place]) 10/25/2015  . Status post-operative repair of hip fracture 10/25/2015  . Greater trochanteric bursitis of right hip 09/29/2015  . Asymptomatic cholelithiasis 05/09/2015  . Essential hypertension 11/07/2014  . Hair loss 11/07/2014  . Bilateral leg edema 11/07/2014  . Constipation due to pain medication 11/06/2014  . Hypotension due to drugs 11/06/2014  . Chest pain 09/01/2014  . Osteoporosis 01/04/2014  . Gout 01/04/2014  . Headache 01/04/2014  . Hypothyroidism 11/19/2013  . Hypercalcemia 10/11/2013  . Insomnia 05/16/2013  . Lumbar back pain 05/16/2013  . Polycythemia vera ([place])   . Neck mass   . Senile osteoporosis   . Macular degeneration   . Sick sinus syndrome ([place]) 03/09/2012  . CAD (coronary artery disease) 12/27/2011  . Paroxysmal atrial fibrillation ([place]) 12/27/2011  . Chronic diastolic CHF (congestive heart failure) ([place]) 12/27/2011  . HTN (hypertension) 12/27/2011   Past Medical History:  Diagnosis Date  . Abnormal stress test    a. 11/2011 Ex MV: EF 80%, small, partially reversible anteroapical defect->mild ischemia vs attenuation-->med Rx.  [name] Kitchen Alopecia, unspecified   . Chronic diastolic CHF (congestive heart failure) ([place])    a. 11/2013 Echo: EF 60-65%, no rwma, mild TR, PASP 11mmHg.  . Closed fracture of lower end of radius with ulna   . Deafness    Left, s/p multiple  surgeries  . Essential hypertension   . Gouty arthropathy, unspecified   . Hypothyroidism   . Insomnia, unspecified   . Macular degeneration    Left, s/p inj. tx  . Mitral valve disorders(424.0)   . Neck mass    right, w/u including MRI negative  . PAF (paroxysmal atrial fibrillation) (HCC)    a. on amio (02/2012 mild  obstruction on PFT's) and xarelto.  . Polycythemia vera(238.4)   . Pure hypercholesterolemia   . Senile osteoporosis    Reclast in the past  . Tricuspid valve disorders, specified as nonrheumatic    Family History  Problem Relation Age of Onset  . Hypertension Father   . Heart disease Father        patient does not know details.   . Ovarian cancer Mother 71  . Cancer Sister        colon  . Heart disease Sister   . Cancer Sister   . Cancer Sister        hodgkin disease  . Breast cancer Daughter   . Cancer Daughter        breast   Past Surgical History:  Procedure Laterality Date  . ABLATION  08/28/14   Back   . BREAST BIOPSY  06/18/1998   left  . BREAST BIOPSY    . ELECTROPHYSIOLOGIC STUDY N/A 01/08/2016   Procedure: Atrial Fibrillation Ablation;  Surgeon: Thompson Grayer, MD;  Location: [facility];  Service: Cardiovascular;  Laterality: N/A;  . FEMUR IM NAIL Right 10/08/2015   Procedure: INTRAMEDULLARY (IM) NAIL FEMORAL RIGHT;  Surgeon: Paralee Cancel, MD;  Location: [facility];  Service: Orthopedics;  Laterality: Right;  . TONSILLECTOMY    . TOTAL ABDOMINAL HYSTERECTOMY    . TYMPANOPLASTY Bilateral    Social History   Occupational History  . Not on file.   Social History Main Topics  . Smoking status: Former Smoker    Types: Cigarettes    Quit date: 01/19/1984  . Smokeless tobacco: Never Used  . Alcohol use 0.6 oz/week    1 Glasses of wine per week     Comment: 2 per day/ 14 a week  . Drug use: No  . Sexual activity: Not Currently    OBJECTIVE:  VS:  HT:5' (152.4 cm)   WT:112 lb 6.4 oz (51 kg)  BMI:22    BP:122/80  HR:99bpm  TEMP: ( )  RESP:94 % EXAM: Findings:  Neck is overall well aligned.  She has limited cervical side bending and rotation.  Negative Spurling's compression test.  Pain with right sided brachial plexus squeeze but this is localized more to the musculature over the upper thoracic spine.  She has 3 trigger points that are appreciated today  in the rhomboid levator scapula and trapezius muscle.  There is positive twitch reflex with these.  She has good strength within the shoulder itself and sensation in bilateral upper extremities is intact.     No results found. ASSESSMENT & PLAN:   Problem List Items Addressed This Visit    Trigger point of right shoulder region - Primary    Marked trigger points on exam today.  She does have underlying cervical spondylosis is focally sore over the rhomboids and levator scapula.  She had good relief following the prior trigger point injections and we will repeat these today.  We can consider referral to physical therapy if any intermediate improvement she can call if she is interested in this.  Patient is at [place] would  like to come here for therapy if possible if it is indicated.  ++++++++++++++++++++++++++++++++++++++++++++ PROCEDURE NOTE -  ULTRASOUND GUIDEDINJECTION: Right Trigger Point injections Images were obtained and interpreted by myself, Teresa Coombs, DO  Images have been saved and stored to PACS system. Images obtained on: GE S7 Ultrasound machine  ULTRASOUND FINDINGS: N/a  DESCRIPTION OF PROCEDURE:  The patient's clinical condition is marked by substantial pain and/or significant functional disability. Other conservative therapy has not provided relief, is contraindicated, or not appropriate. There is a reasonable likelihood that injection will significantly improve the patient's pain and/or functional impairment. After discussing the risks, benefits and expected outcomes of the injection and all questions were reviewed and answered, the patient wished to undergo the above named procedure. Verbal consent was obtained. The ultrasound was used to identify the target structure and adjacent neurovascular structures. The skin was then prepped in sterile fashion and the target structure was injected under direct visualization using sterile technique as below: PREP: Alcohol, Ethel  Chloride APPROACH: Single Injection, 25g 1.5" needle INJECTATE: 2 cc 0.5% marcaine, 1 cc 40mg  DepoMedrol ASPIRATE: N/A DRESSING: Band-Aids  Trigger points were palpated and ultrasound was used to visualize lung fields to remain out of them.  Care was taken with needle placement and myotonic twitch was noted prior to injection of 1 cc into all 3 trigger points noted.  Post procedural instructions including recommending icing and warning signs for infection were reviewed. This procedure was well tolerated and there were no complications.   IMPRESSION: Succesful Trigger Point Injections  Of note ultrasound guidance was not charged for this procedure given use only for educational purposes with Faustino Congress, DPT being present for trigger point injections        Relevant Orders   US GUIDED NEEDLE PLACEMENT(NO LINKED CHARGES)   Spondylosis of cervical region without myelopathy or radiculopathy      Follow-up: Return if symptoms worsen or fail to improve.   CMA/ATC served as Education administrator during this visit. History, Physical, and Plan performed by medical provider. Documentation and orders reviewed and attested to.      Teresa Coombs, [facility] Physician

## 2016-07-13 ENCOUNTER — Telehealth: Payer: Self-pay | Admitting: *Deleted

## 2016-07-13 NOTE — Telephone Encounter (Signed)
Dee, please get this ready for her and I'll sign it.  Thanks.

## 2016-07-13 NOTE — Telephone Encounter (Signed)
Patient called requesting to pick up Fentanyl Patch Rx from [facility] while you are out there. Patient is not due until Friday 6/8 but patient wants to pick it up from [facility]. Please Advise.

## 2016-07-14 ENCOUNTER — Encounter: Payer: Self-pay | Admitting: Internal Medicine

## 2016-07-14 ENCOUNTER — Non-Acute Institutional Stay: Payer: Medicare Other | Admitting: Internal Medicine

## 2016-07-14 VITALS — BP 112/60 | HR 78 | Temp 98.3°F | Wt 111.0 lb

## 2016-07-14 DIAGNOSIS — M810 Age-related osteoporosis without current pathological fracture: Secondary | ICD-10-CM

## 2016-07-14 DIAGNOSIS — M25511 Pain in right shoulder: Secondary | ICD-10-CM

## 2016-07-14 DIAGNOSIS — I480 Paroxysmal atrial fibrillation: Secondary | ICD-10-CM

## 2016-07-14 DIAGNOSIS — M545 Low back pain, unspecified: Secondary | ICD-10-CM

## 2016-07-14 MED ORDER — DENOSUMAB 60 MG/ML ~~LOC~~ SOLN
60.0000 mg | Freq: Once | SUBCUTANEOUS | Status: AC
  Administered 2016-07-14: 60 mg via SUBCUTANEOUS

## 2016-07-14 NOTE — Progress Notes (Signed)
Location:  [facility] Provider:  Tishara Pizano L. Mariea Clonts, D.O., C.M.D.  Code Status: DNR Goals of Care:  Advanced Directives 07/14/2016  Does Patient Have a Medical Advance Directive? Yes  Type of Paramedic of [place];Living will  Does patient want to make changes to medical advance directive? -  Copy of [place] in Chart? Yes  Would patient like information on creating a medical advance directive? -  Pre-existing out of facility DNR order (yellow form or pink MOST form) -   Chief Complaint  Patient presents with  . Medical Management of Chronic Issues    14mth follow-up    HPI: Patient is a 83 y.o. female seen today for medical management of chronic diseases.    Had some steroid trigger point injections for a pinched nerve in her right shoulder.  Feels good today.  Afib and CHF:  Saw Dr. Rayann Heman last 5/21.  He increased her hydralazine, but was otherwise no changes.   BP much better.     Low back pain:  Ablation in back did not work.  Getting cortisone injections until she could have another procedure for her back.    Senile osteoporosis.  Had her prolia today.  Continues on vitamin D supplement and does walk for exercise and go to some exercise classes.    Is due for tdap--opted to wait till next visit due to other shots just received including prolia and back injections.  No new concerns.     Past Medical History:  Diagnosis Date  . Abnormal stress test    a. 11/2011 Ex MV: EF 80%, small, partially reversible anteroapical defect->mild ischemia vs attenuation-->med Rx.  [name] Kitchen Alopecia, unspecified   . Chronic diastolic CHF (congestive heart failure) ([place])    a. 11/2013 Echo: EF 60-65%, no rwma, mild TR, PASP 46mmHg.  . Closed fracture of lower end of radius with ulna   . Deafness    Left, s/p multiple surgeries  . Essential hypertension   . Gouty arthropathy, unspecified   . Hypothyroidism   . Insomnia, unspecified   . Macular  degeneration    Left, s/p inj. tx  . Mitral valve disorders(424.0)   . Neck mass    right, w/u including MRI negative  . PAF (paroxysmal atrial fibrillation) (HCC)    a. on amio (02/2012 mild obstruction on PFT's) and xarelto.  . Polycythemia vera(238.4)   . Pure hypercholesterolemia   . Senile osteoporosis    Reclast in the past  . Tricuspid valve disorders, specified as nonrheumatic     Past Surgical History:  Procedure Laterality Date  . ABLATION  08/28/14   Back   . BREAST BIOPSY  06/18/1998   left  . BREAST BIOPSY    . ELECTROPHYSIOLOGIC STUDY N/A 01/08/2016   Procedure: Atrial Fibrillation Ablation;  Surgeon: Thompson Grayer, MD;  Location: [facility];  Service: Cardiovascular;  Laterality: N/A;  . FEMUR IM NAIL Right 10/08/2015   Procedure: INTRAMEDULLARY (IM) NAIL FEMORAL RIGHT;  Surgeon: Paralee Cancel, MD;  Location: [facility];  Service: Orthopedics;  Laterality: Right;  . TONSILLECTOMY    . TOTAL ABDOMINAL HYSTERECTOMY    . TYMPANOPLASTY Bilateral     Allergies  Allergen Reactions  . Amlodipine Swelling and Other (See Comments)    Reaction:  Lower extremity swelling     Allergies as of 07/14/2016      Reactions   Amlodipine Swelling, Other (See Comments)   Reaction:  Lower extremity swelling  Medication List       Accurate as of 07/14/16  1:39 PM. Always use your most recent med list.          cholecalciferol 1000 units tablet Commonly known as:  VITAMIN D Take 2,000 Units by mouth daily.   fentaNYL 25 MCG/HR patch Commonly known as:  DURAGESIC - dosed mcg/hr Place 1 patch (25 mcg total) onto the skin every 3 (three) days.   fluticasone 50 MCG/ACT nasal spray Commonly known as:  FLONASE Place 2 sprays into both nostrils 2 (two) times daily.   furosemide 40 MG tablet Commonly known as:  LASIX Take 80 mg by mouth every morning   gabapentin 100 MG capsule Commonly known as:  NEURONTIN Take 300 mg by mouth at bedtime.   hydrALAZINE 50 MG  tablet Commonly known as:  APRESOLINE Take 1.5 tablets (75 mg total) by mouth 3 (three) times daily.   hydroxyurea 500 MG capsule Commonly known as:  HYDREA May take with food to minimize GI side effects. Take 2 capsules on Saturdays and Sundays and 1 capsule for the rest of the week   levothyroxine 112 MCG tablet Commonly known as:  SYNTHROID, LEVOTHROID TAKE 1 TABLET EACH DAY.   Magnesium 250 MG Tabs Take 250 mg by mouth daily.   Melatonin 10 MG Tabs Take 10 mg by mouth at bedtime.   polycarbophil 625 MG tablet Commonly known as:  FIBERCON Take 2 tablets (1,250 mg total) by mouth 2 (two) times daily.   PRESERVISION AREDS 2 Caps Take 2 capsules by mouth daily.   Rivaroxaban 15 MG Tabs tablet Commonly known as:  XARELTO Take 15 mg by mouth daily with supper.   traMADol 50 MG tablet Commonly known as:  ULTRAM Take 1 tablet (50 mg total) by mouth at bedtime.       Review of Systems:  Review of Systems  Constitutional: Negative for chills, fever and malaise/fatigue.  HENT: Negative for congestion and hearing loss.   Eyes: Negative for blurred vision.  Respiratory: Negative for cough and shortness of breath.   Cardiovascular: Negative for chest pain, palpitations and leg swelling.  Gastrointestinal: Negative for abdominal pain, blood in stool, constipation and melena.  Genitourinary: Negative for dysuria.  Musculoskeletal: Positive for back pain and myalgias. Negative for falls.  Skin: Negative for itching and rash.  Neurological: Negative for dizziness, loss of consciousness and weakness.  Psychiatric/Behavioral: Negative for depression and memory loss. The patient is not nervous/anxious and does not have insomnia.     Health Maintenance  Topic Date Due  . TETANUS/TDAP  08/03/1949  . INFLUENZA VACCINE  09/08/2016  . DEXA SCAN  Completed  . PNA vac Low Risk Adult  Completed    Physical Exam: Vitals:   07/14/16 1332  BP: 112/60  Pulse: 78  Temp: 98.3 F  (36.8 C)  TempSrc: Oral  SpO2: 96%  Weight: 111 lb (50.3 kg)   Body mass index is 21.68 kg/m. Physical Exam  Constitutional: She is oriented to person, place, and time. She appears well-developed and well-nourished. No distress.  HENT:  Head: Normocephalic and atraumatic.  Cardiovascular: Normal rate, regular rhythm, normal heart sounds and intact distal pulses.   Pulmonary/Chest: Effort normal and breath sounds normal. No respiratory distress.  Abdominal: Bowel sounds are normal.  Musculoskeletal:  Ambulates with rolling walker  Neurological: She is alert and oriented to person, place, and time.  Skin: Skin is warm and dry. Capillary refill takes less than 2 seconds.  Psychiatric:  She has a normal mood and affect.    Labs reviewed: Basic Metabolic Panel:  Recent Labs  07/18/15 1506  02/05/16 1427 02/18/16 1514 05/24/16 1527  NA  --   < > 137 137 141  K  --   < > 3.7 3.8 4.6  CL  --   < > 98* 96* 99  CO2  --   < > 29 31 28   GLUCOSE  --   < > 91 94 82  BUN  --   < > 17 16 22   CREATININE  --   < > 0.76 0.86 0.91  CALCIUM  --   < > 10.1 10.7* 10.7*  TSH 2.21  --   --  1.232  --   < > = values in this interval not displayed. Liver Function Tests:  Recent Labs  10/06/15 0408 02/18/16 1514  AST 18 18  ALT 18 14  ALKPHOS 49 49  BILITOT 0.6 0.9  PROT 6.5 6.7  ALBUMIN 4.3 4.7   No results for input(s): LIPASE, AMYLASE in the last 8760 hours. No results for input(s): AMMONIA in the last 8760 hours. CBC:  Recent Labs  12/22/15 1407  01/09/16 0628 03/04/16 1416 05/24/16 1527  WBC 6.5  < > 10.6* 7.4 11.3*  NEUTROABS 4,550  --   --  4.8 8.7*  HGB 13.3  < > 11.2* 13.2 14.1  HCT 40.9  < > 34.9* 39.9 41.6  MCV 113.6*  < > 115.9* 117.0* 113*  PLT 388  < > 254 358 581*  < > = values in this interval not displayed. Lipid Panel:  Recent Labs  07/29/15 1200  CHOL 162  HDL 101*  LDLCALC 42  TRIG 100  specialty notes reviewed  Assessment/Plan 1. 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  2. Paroxysmal atrial fibrillation (HCC) -cont to follow with Dr. Rayann Heman, just seen and stable as above -cont xarelto anticoagulant  3. Lumbar back pain -getting cortisone shots to tide her over to next epidural and continues on fentanyl patch with hs tramadol plus gabapentin at hs for radicular pain  4. Acute pain of right shoulder -better after trigger point injections with sports medicine  Labs/tests ordered:  No orders of the defined types were placed in this encounter.  Next appt:  6 mos med mgt  Sumaiyah Markert L. Quentez Lober, D.O. [facility] [address], [place] [address] Cell Phone (Mon-Fri 8am-5pm):  [phone] On Call:  [phone] & follow prompts after 5pm & weekends Office Phone:  [phone] Office Fax:  [phone]

## 2016-07-16 MED ORDER — FENTANYL 25 MCG/HR TD PT72: 25.0000 ug | MEDICATED_PATCH | TRANSDERMAL | 0 refills | Status: DC

## 2016-07-16 NOTE — Telephone Encounter (Signed)
Patient picked up paper script from [facility].

## 2016-07-19 ENCOUNTER — Other Ambulatory Visit: Payer: Self-pay

## 2016-07-19 ENCOUNTER — Telehealth: Payer: Self-pay | Admitting: Internal Medicine

## 2016-07-19 DIAGNOSIS — M25511 Pain in right shoulder: Secondary | ICD-10-CM

## 2016-07-19 NOTE — Telephone Encounter (Signed)
Referral placed for PT at [facility].

## 2016-07-19 NOTE — Telephone Encounter (Signed)
Patient is calling per Dr. Nicolasa Ducking request b/c she is still having right shoulder pain.  She said he mentioned Collet[place] on her last visit.  Please advise.   Thanks,  -LL

## 2016-07-19 NOTE — Telephone Encounter (Signed)
Pt has been scheduled and is aware of her appointment.

## 2016-07-21 ENCOUNTER — Ambulatory Visit (INDEPENDENT_AMBULATORY_CARE_PROVIDER_SITE_OTHER): Payer: Medicare Other | Admitting: Physical Therapy

## 2016-07-21 DIAGNOSIS — M791 Myalgia, unspecified site: Secondary | ICD-10-CM

## 2016-07-21 DIAGNOSIS — G8929 Other chronic pain: Secondary | ICD-10-CM | POA: Diagnosis not present

## 2016-07-21 DIAGNOSIS — M25511 Pain in right shoulder: Secondary | ICD-10-CM

## 2016-07-21 DIAGNOSIS — R293 Abnormal posture: Secondary | ICD-10-CM

## 2016-07-21 DIAGNOSIS — M6281 Muscle weakness (generalized): Secondary | ICD-10-CM

## 2016-07-21 NOTE — Patient Instructions (Signed)
Trigger Point Dry Needling  . What is Trigger Point Dry Needling (DN)? o DN is a physical therapy technique used to treat muscle pain and dysfunction. Specifically, DN helps deactivate muscle trigger points (muscle knots).  o A thin filiform needle is used to penetrate the skin and stimulate the underlying trigger point. The goal is for a local twitch response (LTR) to occur and for the trigger point to relax. No medication of any kind is injected during the procedure.   . What Does Trigger Point Dry Needling Feel Like?  o The procedure feels different for each individual patient. Some patients report that they do not actually feel the needle enter the skin and overall the process is not painful. Very mild bleeding may occur. However, many patients feel a deep cramping in the muscle in which the needle was inserted. This is the local twitch response.   [name] Kitchen How Will I feel after the treatment? o Soreness is normal, and the onset of soreness may not occur for a few hours. Typically this soreness does not last longer than two days.  o Bruising is uncommon, however; ice can be used to decrease any possible bruising.  o In rare cases feeling tired or nauseous after the treatment is normal. In addition, your symptoms may get worse before they get better, this period will typically not last longer than 24 hours.   . What Can I do After My Treatment? o Increase your hydration by drinking more water for the next 24 hours. o You may place ice or heat on the areas treated that have become sore, however, do not use heat on inflamed or bruised areas. Heat often brings more relief post needling. o You can continue your regular activities, but vigorous activity is not recommended initially after the treatment for 24 hours. o DN is best combined with other physical therapy such as strengthening, stretching, and other therapies.    Scapular Retraction (Standing)    With arms at sides, pinch shoulder blades  together.  Hold for 5 seconds. Repeat _10___ times per set. Do __1__ sets per session. Do _2-3___ sessions per day.  http://orth.exer.us/945   Copyright  VHI. All rights reserved.

## 2016-07-22 NOTE — Therapy (Addendum)
[facility] [address] [place], [place], [address] Phone: [phone]   Fax:  [phone]  Physical Therapy Evaluation  Patient Details  Name: Sarah Mays MRN: [id number] Date of Birth: 1930/04/15 Referring Provider: Dr. Teresa Coombs  Encounter Date: 07/21/2016      PT End of Session - 07/21/16 1558    Visit Number 1   Number of Visits 6   Date for PT Re-Evaluation 09/01/16   PT Start Time 1316   PT Stop Time 1356   PT Time Calculation (min) 40 min   Activity Tolerance Patient tolerated treatment well   Behavior During Therapy [facility] for tasks assessed/performed      Past Medical History:  Diagnosis Date  . Abnormal stress test    a. 11/2011 Ex MV: EF 80%, small, partially reversible anteroapical defect->mild ischemia vs attenuation-->med Rx.  [name] Kitchen Alopecia, unspecified   . Chronic diastolic CHF (congestive heart failure) ([place])    a. 11/2013 Echo: EF 60-65%, no rwma, mild TR, PASP 58mmHg.  . Closed fracture of lower end of radius with ulna   . Deafness    Left, s/p multiple surgeries  . Essential hypertension   . Gouty arthropathy, unspecified   . Hypothyroidism   . Insomnia, unspecified   . Macular degeneration    Left, s/p inj. tx  . Mitral valve disorders(424.0)   . Neck mass    right, w/u including MRI negative  . PAF (paroxysmal atrial fibrillation) (HCC)    a. on amio (02/2012 mild obstruction on PFT's) and xarelto.  . Polycythemia vera(238.4)   . Pure hypercholesterolemia   . Senile osteoporosis    Reclast in the past  . Tricuspid valve disorders, specified as nonrheumatic     Past Surgical History:  Procedure Laterality Date  . ABLATION  08/28/14   Back   . BREAST BIOPSY  06/18/1998   left  . BREAST BIOPSY    . ELECTROPHYSIOLOGIC STUDY N/A 01/08/2016   Procedure: Atrial Fibrillation Ablation;  Surgeon: Thompson Grayer, MD;  Location: [facility];  Service: Cardiovascular;  Laterality: N/A;  .  FEMUR IM NAIL Right 10/08/2015   Procedure: INTRAMEDULLARY (IM) NAIL FEMORAL RIGHT;  Surgeon: Paralee Cancel, MD;  Location: [facility];  Service: Orthopedics;  Laterality: Right;  . TONSILLECTOMY    . TOTAL ABDOMINAL HYSTERECTOMY    . TYMPANOPLASTY Bilateral     There were no vitals filed for this visit.       Subjective Assessment - 07/21/16 1519    Subjective Pt is an 83 y/o female who presents to OPPT for 3-4 month history of Rt neck and shoulder pain.  Pt unsure of cause of symptoms.  Pt has injection last week (07/12/16) and reports she had ~ 6-7 days relief, but then pain returned.     Limitations House hold activities   Patient Stated Goals improve pain and function   Currently in Pain? Yes   Pain Score 4   up to 8-9/10 yesterday   Pain Location Shoulder   Pain Orientation Right   Pain Descriptors / Indicators Aching;Dull   Pain Type Chronic pain   Pain Onset More than a month ago   Pain Frequency Intermittent   Aggravating Factors  increased activity (walking, being up)   Pain Relieving Factors injections, pain patch (for lower back)            [facility] PT Assessment - 07/21/16 1524      Assessment   Medical Diagnosis  Rt shoulder pain   Referring Provider Dr. Teresa Coombs   Onset Date/Surgical Date --  March 2018   Hand Dominance Right   Next MD Visit PRN   Prior Therapy n/a     Precautions   Precautions None     Restrictions   Weight Bearing Restrictions No     Balance Screen   Has the patient fallen in the past 6 months No   Has the patient had a decrease in activity level because of a fear of falling?  No   Is the patient reluctant to leave their home because of a fear of falling?  No     Home Environment   Living Environment Private residence   Living Arrangements Alone   Type of [place] Access Level entry;Elevator   [place] One level   Additional Comments lives in 2nd floor apartment at [place] Retired   Leisure was exercising regularly     Posture/Postural Control   Posture/Postural Control Postural limitations   Postural Limitations Rounded Shoulders;Forward head;Increased thoracic kyphosis   Posture Comments Rt scapular winging     ROM / Strength   AROM / PROM / Strength AROM;Strength     AROM   Overall AROM Comments bil shoulder WNL     Strength   Strength Assessment Site Shoulder   Right/Left Shoulder Right;Left   Right Shoulder Flexion 3/5   Right Shoulder ABduction 3/5   Right Shoulder Internal Rotation 4/5   Right Shoulder External Rotation 3/5   Left Shoulder Flexion 3/5   Left Shoulder ABduction 3/5   Left Shoulder Internal Rotation 4/5   Left Shoulder External Rotation 3+/5     Palpation   Patella mobility --   Palpation comment active trigger points Rt upper trap and levator; tightness noted infraspinatus and teres minor     Ambulation/Gait   Gait Comments amb mod I with RW            Objective measurements completed on examination: See above findings.          [facility] Adult PT Treatment/Exercise - 07/21/16 1524      Exercises   Exercises Shoulder     Shoulder Exercises: Standing   Retraction Both;5 reps   Retraction Limitations 5 sec hold     Manual Therapy   Manual Therapy Soft tissue mobilization   Soft tissue mobilization Rt levator scapula and upper trap; instructed in use of ball for self-mobilization          Trigger Point Dry Needling - 07/21/16 1557    Consent Given? Yes   Education Handout Provided Yes   Muscles Treated Upper Body Levator scapulae   Levator Scapulae Response Twitch response elicited;Palpable increased muscle length              PT Education - 07/21/16 1558    Education provided Yes   Education Details TDN, HEP   Person(s) Educated Patient   Methods Explanation;Handout;Demonstration   Comprehension Verbalized understanding;Returned demonstration              PT Long Term Goals - 07/22/16 0746      PT LONG TERM GOAL #1   Title independent with HEP (09/01/16)   Time 6   Period Weeks   Status New     PT LONG TERM GOAL #2   Title improve Rt shoulder strength to at least  4/5 for improved strength and function (09/01/16)   Time 6   Period Weeks   Status New     PT LONG TERM GOAL #3   Title verbalize understanding of postural awareness to decrease risk of reinjury (09/01/16)   Time 6   Period Weeks   Status New     PT LONG TERM GOAL #4   Title report pain < 4/10 for improved function and quality of life (09/01/16)   Time 6   Period Weeks   Status New                Plan - 07/22/16 0743    Clinical Impression Statement Pt is an 83 y/o female who presents to OPPT for 3 month hx of Rt shoulder and scapular pain.  Pt demonstrates postural dysfunctions, active trigger points and decreased strength all contributing to symptoms.  Pt will benefit from PT to address deficits.   Clinical Presentation Stable   Clinical Decision Making Low   Rehab Potential Good   PT Frequency 1x / week   PT Duration 6 weeks   PT Treatment/Interventions ADLs/Self Care Home Management;Moist Heat;Electrical Stimulation;Neuromuscular re-education;Therapeutic exercise;Patient/family education;Dry needling;Manual techniques   PT Next Visit Plan assess response to TDN, postural exercises to HEP, manual and modalities PRN   Consulted and Agree with Plan of Care Patient      Patient will benefit from skilled therapeutic intervention in order to improve the following deficits and impairments:  Increased muscle spasms, Increased fascial restricitons, Impaired UE functional use, Postural dysfunction, Decreased strength  Visit Diagnosis: Chronic right shoulder pain - Plan: PT plan of care cert/re-cert  Muscle weakness (generalized) - Plan: PT plan of care cert/re-cert  Abnormal posture - Plan: PT plan of care cert/re-cert  Myalgia - Plan: PT plan  of care cert/re-cert      Late Entry G Code: Functional Assessment Tool Used  clinical judgement   Functional Limitations Self care   Self Care Current Status (O6712) At least 20 percent but less than 40 percent impaired, limited or restricted   Self Care Goal Status (W5809) At least 1 percent but less than 20 percent impaired, limited or restricted   Laureen Abrahams, PT, DPT 08/09/16 4:45 PM    Problem List Patient Active Problem List   Diagnosis Date Noted  . Trigger point of right shoulder region 05/05/2016  . Spondylosis of cervical region without myelopathy or radiculopathy 05/05/2016  . Chondrocalcinosis 05/05/2016  . Chronic pain disorder 05/05/2016  . A-fib ([place]) 01/08/2016  . Closed fracture of shaft of right femur, initial encounter ([place]) 10/25/2015  . Status post-operative repair of hip fracture 10/25/2015  . Greater trochanteric bursitis of right hip 09/29/2015  . Asymptomatic cholelithiasis 05/09/2015  . Essential hypertension 11/07/2014  . Hair loss 11/07/2014  . Bilateral leg edema 11/07/2014  . Constipation due to pain medication 11/06/2014  . Hypotension due to drugs 11/06/2014  . Chest pain 09/01/2014  . Osteoporosis 01/04/2014  . Gout 01/04/2014  . Headache 01/04/2014  . Hypothyroidism 11/19/2013  . Hypercalcemia 10/11/2013  . Insomnia 05/16/2013  . Lumbar back pain 05/16/2013  . Polycythemia vera ([place])   . Neck mass   . Senile osteoporosis   . Macular degeneration   . Sick sinus syndrome ([place]) 03/09/2012  . CAD (coronary artery disease) 12/27/2011  . Paroxysmal atrial fibrillation ([place]) 12/27/2011  . Chronic diastolic CHF (congestive heart failure) ([place]) 12/27/2011  . HTN (hypertension) 12/27/2011      Laureen Abrahams,  PT, DPT 07/22/16 7:51 AM    [facility] [place], [place], [address] Phone: [phone]   Fax:  [phone]  Name: Sarah Mays MRN:  [id number] Date of Birth: [birth year]

## 2016-07-26 ENCOUNTER — Ambulatory Visit (INDEPENDENT_AMBULATORY_CARE_PROVIDER_SITE_OTHER): Payer: Medicare Other | Admitting: Physical Therapy

## 2016-07-26 DIAGNOSIS — G8929 Other chronic pain: Secondary | ICD-10-CM

## 2016-07-26 DIAGNOSIS — R293 Abnormal posture: Secondary | ICD-10-CM

## 2016-07-26 DIAGNOSIS — M6281 Muscle weakness (generalized): Secondary | ICD-10-CM | POA: Diagnosis not present

## 2016-07-26 DIAGNOSIS — M791 Myalgia, unspecified site: Secondary | ICD-10-CM

## 2016-07-26 DIAGNOSIS — M25511 Pain in right shoulder: Secondary | ICD-10-CM

## 2016-07-26 NOTE — Therapy (Signed)
[facility] [address] [place], [place], [address] Phone: [phone]   Fax:  [phone]  Physical Therapy Treatment  Patient Details  Name: Sarah Mays MRN: [id number] Date of Birth: 10/19/1930 Referring Provider: Dr. Teresa Coombs  Encounter Date: 07/26/2016      PT End of Session - 07/26/16 1648    Visit Number 2   Number of Visits 6   Date for PT Re-Evaluation 09/01/16   PT Start Time 6195   PT Stop Time 1445   PT Time Calculation (min) 43 min   Activity Tolerance Patient tolerated treatment well   Behavior During Therapy [facility] for tasks assessed/performed      Past Medical History:  Diagnosis Date  . Abnormal stress test    a. 11/2011 Ex MV: EF 80%, small, partially reversible anteroapical defect->mild ischemia vs attenuation-->med Rx.  [name] Kitchen Alopecia, unspecified   . Chronic diastolic CHF (congestive heart failure) ([place])    a. 11/2013 Echo: EF 60-65%, no rwma, mild TR, PASP 28mmHg.  . Closed fracture of lower end of radius with ulna   . Deafness    Left, s/p multiple surgeries  . Essential hypertension   . Gouty arthropathy, unspecified   . Hypothyroidism   . Insomnia, unspecified   . Macular degeneration    Left, s/p inj. tx  . Mitral valve disorders(424.0)   . Neck mass    right, w/u including MRI negative  . PAF (paroxysmal atrial fibrillation) (HCC)    a. on amio (02/2012 mild obstruction on PFT's) and xarelto.  . Polycythemia vera(238.4)   . Pure hypercholesterolemia   . Senile osteoporosis    Reclast in the past  . Tricuspid valve disorders, specified as nonrheumatic     Past Surgical History:  Procedure Laterality Date  . ABLATION  08/28/14   Back   . BREAST BIOPSY  06/18/1998   left  . BREAST BIOPSY    . ELECTROPHYSIOLOGIC STUDY N/A 01/08/2016   Procedure: Atrial Fibrillation Ablation;  Surgeon: Thompson Grayer, MD;  Location: [facility];  Service: Cardiovascular;  Laterality: N/A;  . FEMUR  IM NAIL Right 10/08/2015   Procedure: INTRAMEDULLARY (IM) NAIL FEMORAL RIGHT;  Surgeon: Paralee Cancel, MD;  Location[facility];  Service: Orthopedics;  Laterality: Right;  . TONSILLECTOMY    . TOTAL ABDOMINAL HYSTERECTOMY    . TYMPANOPLASTY Bilateral     There were no vitals filed for this visit.      Subjective Assessment - 07/26/16 1605    Subjective was pretty sore after last session, lasting until yesterday.  today is a good day.     Limitations House hold activities   Patient Stated Goals improve pain and function   Currently in Pain? No/denies  only some soreness with touching trigger point                         [facility] Adult PT Treatment/Exercise - 07/26/16 1624      Shoulder Exercises: Supine   Horizontal ABduction Both;10 reps;Theraband   Theraband Level (Shoulder Horizontal ABduction) Level 1 (Yellow)   External Rotation Both;10 reps;Theraband   Theraband Level (Shoulder External Rotation) Level 1 (Yellow)   Other Supine Exercises cervical retraction x 10 reps     Shoulder Exercises: Seated   Retraction Both;10 reps;Theraband   Theraband Level (Shoulder Retraction) Level 1 (Yellow)     Manual Therapy   Manual Therapy Myofascial release   Myofascial Release Trigger point  release to Rt upper trap, levator and cervical paraspinals                PT Education - 07/26/16 1648    Education provided Yes   Education Details HEP   Person(s) Educated Patient   Methods Explanation;Demonstration;Handout   Comprehension Verbalized understanding;Returned demonstration             PT Long Term Goals - 07/22/16 0746      PT LONG TERM GOAL #1   Title independent with HEP (09/01/16)   Time 6   Period Weeks   Status New     PT LONG TERM GOAL #2   Title improve Rt shoulder strength to at least 4/5 for improved strength and function (09/01/16)   Time 6   Period Weeks   Status New     PT LONG TERM GOAL #3   Title verbalize understanding of  postural awareness to decrease risk of reinjury (09/01/16)   Time 6   Period Weeks   Status New     PT LONG TERM GOAL #4   Title report pain < 4/10 for improved function and quality of life (09/01/16)   Time 6   Period Weeks   Status New               Plan - 07/26/16 1648    Clinical Impression Statement Pt reports improved pain following increased soreness x 3-4 days after DN.  Pt without pain today so deferred additional needling.  Added additional postural strengthening exercises to HEP today.  Will continue to benefit from PT to maximize function.   PT Treatment/Interventions ADLs/Self Care Home Management;Moist Heat;Electrical Stimulation;Neuromuscular re-education;Therapeutic exercise;Patient/family education;Dry needling;Manual techniques   PT Next Visit Plan review HEP, DN, manual and modalities PRN   Consulted and Agree with Plan of Care Patient      Patient will benefit from skilled therapeutic intervention in order to improve the following deficits and impairments:  Increased muscle spasms, Increased fascial restricitons, Impaired UE functional use, Postural dysfunction, Decreased strength  Visit Diagnosis: Chronic right shoulder pain  Muscle weakness (generalized)  Abnormal posture  Myalgia     Problem List Patient Active Problem List   Diagnosis Date Noted  . Trigger point of right shoulder region 05/05/2016  . Spondylosis of cervical region without myelopathy or radiculopathy 05/05/2016  . Chondrocalcinosis 05/05/2016  . Chronic pain disorder 05/05/2016  . A-fib ([place]) 01/08/2016  . Closed fracture of shaft of right femur, initial encounter ([place]) 10/25/2015  . Status post-operative repair of hip fracture 10/25/2015  . Greater trochanteric bursitis of right hip 09/29/2015  . Asymptomatic cholelithiasis 05/09/2015  . Essential hypertension 11/07/2014  . Hair loss 11/07/2014  . Bilateral leg edema 11/07/2014  . Constipation due to pain medication  11/06/2014  . Hypotension due to drugs 11/06/2014  . Chest pain 09/01/2014  . Osteoporosis 01/04/2014  . Gout 01/04/2014  . Headache 01/04/2014  . Hypothyroidism 11/19/2013  . Hypercalcemia 10/11/2013  . Insomnia 05/16/2013  . Lumbar back pain 05/16/2013  . Polycythemia vera ([place])   . Neck mass   . Senile osteoporosis   . Macular degeneration   . Sick sinus syndrome ([place]) 03/09/2012  . CAD (coronary artery disease) 12/27/2011  . Paroxysmal atrial fibrillation ([place]) 12/27/2011  . Chronic diastolic CHF (congestive heart failure) ([place]) 12/27/2011  . HTN (hypertension) 12/27/2011      Laureen Abrahams, PT, DPT 07/26/16 4:50 PM    [facility] [phone]  [place], [place], [address] Phone: [phone]   Fax:  [phone]  Name: ANAKAREN CAMPION MRN: [id number] Date of Birth: [birth year]

## 2016-07-26 NOTE — Patient Instructions (Signed)
Scapular Retraction: Rowing (Eccentric) - Arms - Side (Resistance Band)    Sit in a chair with band anchored around the door knob on the other side of the door.  Hold end of band in each hand. Pull back until elbows are even with trunk. Keep elbows by sides, thumbs up. Slowly release for 3-5 seconds. Use __yellow______ resistance band. _10__ reps per set, __1-2_ sets per day, _6-7__ days per week.   Resisted Horizontal Abduction: Bilateral    Lie on your back,  tubing in both hands, arms out in front. Keeping arms straight, pinch shoulder blades together and stretch arms out. Repeat __10__ times per set. Do _1___ sets per session. Do __1-2__ sessions per day.  Resisted External Rotation: in Neutral - Bilateral    Lie on your back, tubing in both hands, elbows at sides, bent to 90, forearms forward. Pinch shoulder blades together and rotate forearms out. Keep elbows at sides. Repeat __10__ times per set. Do __1__ sets per session. Do __1-2__ sessions per day.       Supine Push    Take a deep breath and exhale while pushing the back of neck down on the bed. Hold __5__ seconds. Repeat __10__ times. Do __1-2__ sessions per day.  http://gt2.exer.us/1   Copyright  VHI. All rights reserved.

## 2016-07-27 ENCOUNTER — Telehealth: Payer: Self-pay | Admitting: Hematology and Oncology

## 2016-07-27 ENCOUNTER — Other Ambulatory Visit (HOSPITAL_BASED_OUTPATIENT_CLINIC_OR_DEPARTMENT_OTHER): Payer: Medicare Other

## 2016-07-27 ENCOUNTER — Ambulatory Visit (HOSPITAL_BASED_OUTPATIENT_CLINIC_OR_DEPARTMENT_OTHER): Payer: Medicare Other | Admitting: Hematology and Oncology

## 2016-07-27 DIAGNOSIS — D45 Polycythemia vera: Secondary | ICD-10-CM | POA: Diagnosis not present

## 2016-07-27 DIAGNOSIS — I10 Essential (primary) hypertension: Secondary | ICD-10-CM

## 2016-07-27 LAB — CBC WITH DIFFERENTIAL/PLATELET
BASO%: 0.6 % (ref 0.0–2.0)
BASOS ABS: 0.1 10*3/uL (ref 0.0–0.1)
EOS%: 1.1 % (ref 0.0–7.0)
Eosinophils Absolute: 0.1 10*3/uL (ref 0.0–0.5)
HCT: 43 % (ref 34.8–46.6)
HEMOGLOBIN: 14.5 g/dL (ref 11.6–15.9)
LYMPH#: 1.6 10*3/uL (ref 0.9–3.3)
LYMPH%: 15.4 % (ref 14.0–49.7)
MCH: 39 pg — ABNORMAL HIGH (ref 25.1–34.0)
MCHC: 33.7 g/dL (ref 31.5–36.0)
MCV: 115.6 fL — ABNORMAL HIGH (ref 79.5–101.0)
MONO#: 0.7 10*3/uL (ref 0.1–0.9)
MONO%: 7.2 % (ref 0.0–14.0)
NEUT%: 75.7 % (ref 38.4–76.8)
NEUTROS ABS: 7.7 10*3/uL — AB (ref 1.5–6.5)
NRBC: 0 % (ref 0–0)
Platelets: 567 10*3/uL — ABNORMAL HIGH (ref 145–400)
RBC: 3.72 10*6/uL (ref 3.70–5.45)
RDW: 15.2 % — AB (ref 11.2–14.5)
WBC: 10.1 10*3/uL (ref 3.9–10.3)

## 2016-07-27 NOTE — Telephone Encounter (Signed)
Scheduled appt per 6/19 los - Gave patient AVS and calender .  

## 2016-07-28 ENCOUNTER — Encounter: Payer: Self-pay | Admitting: Hematology and Oncology

## 2016-07-28 NOTE — Progress Notes (Signed)
[facility] OFFICE PROGRESS NOTE  Patient Care Team: Gayland Curry, DO as PCP - General (Geriatric Medicine) Community, [facility], Clent Demark, MD as Consulting Physician (Ophthalmology) Larey Dresser, MD as Consulting Physician (Cardiology) Heath Lark, MD as Consulting Physician (Hematology and Oncology) Clent Jacks, MD as Consulting Physician (Ophthalmology) Jerrell Belfast, MD as Consulting Physician (Otolaryngology)  SUMMARY OF ONCOLOGIC HISTORY:   Polycythemia vera ([place])   08/05/2011 Pathology Results    Peripheral blood JAK2 mutation was positive with low serum erythropoietin level. Bone marrow aspirate and biopsy was not performed.      08/12/2011 -  Chemotherapy    She is started on hydroxyurea with periodic phlebotomy.       INTERVAL HISTORY: Please see below for problem oriented charting. She returns for further follow-up She missed her appointment recently. She is doing well Denies recent infection She stated she has been compliant taking hydroxyurea at 500 mg daily from Mondays to Fridays and to take 1000 mg on Saturdays and Sundays The patient denies any recent signs or symptoms of bleeding such as spontaneous epistaxis, hematuria or hematochezia.  REVIEW OF SYSTEMS:   Constitutional: Denies fevers, chills or abnormal weight loss Eyes: Denies blurriness of vision Ears, nose, mouth, throat, and face: Denies mucositis or sore throat Respiratory: Denies cough, dyspnea or wheezes Cardiovascular: Denies palpitation, chest discomfort or lower extremity swelling Gastrointestinal:  Denies nausea, heartburn or change in bowel habits Skin: Denies abnormal skin rashes Lymphatics: Denies new lymphadenopathy or easy bruising Neurological:Denies numbness, tingling or new weaknesses Behavioral/Psych: Mood is stable, no new changes  All other systems were reviewed with the patient and are negative.  I have reviewed the past medical  history, past surgical history, social history and family history with the patient and they are unchanged from previous note.  ALLERGIES:  is allergic to amlodipine.  MEDICATIONS:  Current Outpatient Prescriptions  Medication Sig Dispense Refill  . cholecalciferol (VITAMIN D) 1000 UNITS tablet Take 2,000 Units by mouth daily.     . fentaNYL (DURAGESIC - DOSED MCG/HR) 25 MCG/HR patch Place 1 patch (25 mcg total) onto the skin every 3 (three) days. 10 patch 0  . fluticasone (FLONASE) 50 MCG/ACT nasal spray Place 2 sprays into both nostrils 2 (two) times daily.    . furosemide (LASIX) 40 MG tablet Take 80 mg by mouth every morning 180 tablet 2  . gabapentin (NEURONTIN) 100 MG capsule Take 300 mg by mouth at bedtime.     . hydrALAZINE (APRESOLINE) 50 MG tablet Take 1.5 tablets (75 mg total) by mouth 3 (three) times daily. 135 tablet 3  . hydroxyurea (HYDREA) 500 MG capsule May take with food to minimize GI side effects. Take 2 capsules on Saturdays and Sundays and 1 capsule for the rest of the week 90 capsule 11  . levothyroxine (SYNTHROID, LEVOTHROID) 112 MCG tablet TAKE 1 TABLET EACH DAY. 30 tablet 5  . Magnesium 250 MG TABS Take 250 mg by mouth daily.     . Melatonin 10 MG TABS Take 10 mg by mouth at bedtime.    . Multiple Vitamins-Minerals (PRESERVISION AREDS 2) CAPS Take 2 capsules by mouth daily.     . polycarbophil (FIBERCON) 625 MG tablet Take 2 tablets (1,250 mg total) by mouth 2 (two) times daily. 60 tablet 0  . Rivaroxaban (XARELTO) 15 MG TABS tablet Take 15 mg by mouth daily with supper.    . traMADol (ULTRAM) 50 MG tablet Take 1 tablet (50  mg total) by mouth at bedtime. 30 tablet 0   No current facility-administered medications for this visit.     PHYSICAL EXAMINATION: ECOG PERFORMANCE STATUS: 0 - Asymptomatic  Vitals:   07/27/16 1459  BP: (!) 153/80  Pulse: 89  Resp: 18  Temp: 98.4 F (36.9 C)   Filed Weights   07/27/16 1459  Weight: 111 lb 12.8 oz (50.7 kg)     GENERAL:alert, no distress and comfortable SKIN: skin color, texture, turgor are normal, no rashes or significant lesions EYES: normal, Conjunctiva are pink and non-injected, sclera clear Musculoskeletal:no cyanosis of digits and no clubbing  NEURO: alert & oriented x 3 with fluent speech, no focal motor/sensory deficits  LABORATORY DATA:  I have reviewed the data as listed    Component Value Date/Time   NA 141 05/24/2016 1527   K 4.6 05/24/2016 1527   CL 99 05/24/2016 1527   CO2 28 05/24/2016 1527   GLUCOSE 82 05/24/2016 1527   GLUCOSE 94 02/18/2016 1514   BUN 22 05/24/2016 1527   CREATININE 0.91 05/24/2016 1527   CREATININE 0.81 12/22/2015 1407   CALCIUM 10.7 (H) 05/24/2016 1527   PROT 6.7 02/18/2016 1514   PROT 6.2 06/20/2015 0903   ALBUMIN 4.7 02/18/2016 1514   ALBUMIN 4.6 06/20/2015 0903   AST 18 02/18/2016 1514   ALT 14 02/18/2016 1514   ALKPHOS 49 02/18/2016 1514   BILITOT 0.9 02/18/2016 1514   BILITOT 0.7 06/20/2015 0903   GFRNONAA 59 (L) 05/24/2016 1527   GFRAA 67 05/24/2016 1527    No results found for: SPEP, UPEP  Lab Results  Component Value Date   WBC 10.1 07/27/2016   NEUTROABS 7.7 (H) 07/27/2016   HGB 14.5 07/27/2016   HCT 43.0 07/27/2016   MCV 115.6 (H) 07/27/2016   PLT 567 (H) 07/27/2016      Chemistry      Component Value Date/Time   NA 141 05/24/2016 1527   K 4.6 05/24/2016 1527   CL 99 05/24/2016 1527   CO2 28 05/24/2016 1527   BUN 22 05/24/2016 1527   CREATININE 0.91 05/24/2016 1527   CREATININE 0.81 12/22/2015 1407   GLU 80 10/29/2014      Component Value Date/Time   CALCIUM 10.7 (H) 05/24/2016 1527   ALKPHOS 49 02/18/2016 1514   AST 18 02/18/2016 1514   ALT 14 02/18/2016 1514   BILITOT 0.9 02/18/2016 1514   BILITOT 0.7 06/20/2015 0903       ASSESSMENT & PLAN:  Polycythemia vera Unfortunately, her CBC show mild worsening disease control The patient stated she has been compliant taking medications as directed I  recommend we modify the dose of hydroxyurea She would take 1000 mg on Mondays, Wednesdays and Fridays and to take 500 mg for the rest of the week She will continue to take Xarelto to prevent risk of blood clot.  Essential hypertension she will continue current medical management. I recommend close follow-up with primary care doctor for medication adjustment.    No orders of the defined types were placed in this encounter.  All questions were answered. The patient knows to call the clinic with any problems, questions or concerns. No barriers to learning was detected. I spent 10 minutes counseling the patient face to face. The total time spent in the appointment was 15 minutes and more than 50% was on counseling and review of test results     Heath Lark, MD 07/28/2016 4:16 PM

## 2016-07-28 NOTE — Assessment & Plan Note (Signed)
Unfortunately, her CBC show mild worsening disease control The patient stated she has been compliant taking medications as directed I recommend we modify the dose of hydroxyurea She would take 1000 mg on Mondays, Wednesdays and Fridays and to take 500 mg for the rest of the week She will continue to take Xarelto to prevent risk of blood clot.

## 2016-07-28 NOTE — Assessment & Plan Note (Signed)
she will continue current medical management. I recommend close follow-up with primary care doctor for medication adjustment.  

## 2016-07-29 ENCOUNTER — Other Ambulatory Visit: Payer: Self-pay | Admitting: Internal Medicine

## 2016-07-29 NOTE — Telephone Encounter (Signed)
rx called into pharmacy

## 2016-08-02 ENCOUNTER — Ambulatory Visit (INDEPENDENT_AMBULATORY_CARE_PROVIDER_SITE_OTHER): Payer: Medicare Other | Admitting: Physical Therapy

## 2016-08-02 DIAGNOSIS — M25511 Pain in right shoulder: Secondary | ICD-10-CM

## 2016-08-02 DIAGNOSIS — R293 Abnormal posture: Secondary | ICD-10-CM | POA: Diagnosis not present

## 2016-08-02 DIAGNOSIS — M791 Myalgia, unspecified site: Secondary | ICD-10-CM

## 2016-08-02 DIAGNOSIS — G8929 Other chronic pain: Secondary | ICD-10-CM

## 2016-08-02 DIAGNOSIS — M6281 Muscle weakness (generalized): Secondary | ICD-10-CM

## 2016-08-02 NOTE — Patient Instructions (Signed)
Flexibility: Upper Trapezius Stretch    Gently grasp right side of head while holding onto chair with other hand. Tilt head away until a gentle stretch is felt. Hold __30__ seconds. Repeat __2-3__ times per set. Do __1__ sets per session. Do __2-3__ sessions per day.   Levator Scapula Stretch, Sitting    Sit, one hand tucked under hip on side to be stretched, other hand over top of head. Turn head toward other side and look down. Use hand on head to gently stretch neck in that position. Hold _30__ seconds. Repeat _2-3__ times per session. Do _2-3__ sessions per day.  Copyright  VHI. All rights reserved.

## 2016-08-02 NOTE — Therapy (Addendum)
[facility] [address] [place], [place], [address] Phone: [phone]   Fax:  [phone]  Physical Therapy Treatment  Patient Details  Name: Sarah Mays MRN: [id number] Date of Birth: Jun 02, 1930 Referring Provider: Dr. Teresa Coombs  Encounter Date: 08/02/2016      PT End of Session - 08/02/16 1638    Visit Number 3   Number of Visits 6   Date for PT Re-Evaluation 09/01/16   PT Start Time 1400   PT Stop Time 1441   PT Time Calculation (min) 41 min   Activity Tolerance Patient tolerated treatment well   Behavior During Therapy [facility] for tasks assessed/performed      Past Medical History:  Diagnosis Date  . Abnormal stress test    a. 11/2011 Ex MV: EF 80%, small, partially reversible anteroapical defect->mild ischemia vs attenuation-->med Rx.  [name] Kitchen Alopecia, unspecified   . Chronic diastolic CHF (congestive heart failure) ([name])    a. 11/2013 Echo: EF 60-65%, no rwma, mild TR, PASP 8mmHg.  . Closed fracture of lower end of radius with ulna   . Deafness    Left, s/p multiple surgeries  . Essential hypertension   . Gouty arthropathy, unspecified   . Hypothyroidism   . Insomnia, unspecified   . Macular degeneration    Left, s/p inj. tx  . Mitral valve disorders(424.0)   . Neck mass    right, w/u including MRI negative  . PAF (paroxysmal atrial fibrillation) (HCC)    a. on amio (02/2012 mild obstruction on PFT's) and xarelto.  . Polycythemia vera(238.4)   . Pure hypercholesterolemia   . Senile osteoporosis    Reclast in the past  . Tricuspid valve disorders, specified as nonrheumatic     Past Surgical History:  Procedure Laterality Date  . ABLATION  08/28/14   Back   . BREAST BIOPSY  06/18/1998   left  . BREAST BIOPSY    . ELECTROPHYSIOLOGIC STUDY N/A 01/08/2016   Procedure: Atrial Fibrillation Ablation;  Surgeon: Thompson Grayer, MD;  Location: [facility];  Service: Cardiovascular;  Laterality: N/A;  . FEMUR  IM NAIL Right 10/08/2015   Procedure: INTRAMEDULLARY (IM) NAIL FEMORAL RIGHT;  Surgeon: Paralee Cancel, MD;  Location[facility];  Service: Orthopedics;  Laterality: Right;  . TONSILLECTOMY    . TOTAL ABDOMINAL HYSTERECTOMY    . TYMPANOPLASTY Bilateral     There were no vitals filed for this visit.                       [facility] Adult PT Treatment/Exercise - 08/02/16 1632      Modalities   Modalities Moist Heat     Moist Heat Therapy   Number Minutes Moist Heat 10 Minutes   Moist Heat Location Cervical     Manual Therapy   Manual Therapy Soft tissue mobilization;Myofascial release   Soft tissue mobilization Rt levator scapula and upper trap   Myofascial Release Trigger point release to Rt upper trap, levator and cervical paraspinals     Neck Exercises: Stretches   Upper Trapezius Stretch 3 reps;30 seconds   Upper Trapezius Stretch Limitations Rt   Levator Stretch 3 reps;30 seconds   Levator Stretch Limitations Rt       Addendum: Trigger Point Dry Needling.  Pt gave informed consent and handout provided prior to session. DN to levator scapulae and upper trap on Rt with twitch response noted and decreased tightness with increased muscle length.  Colletta [place]  Staci Righter, PT, DPT 08/10/16 2:21 PM          PT Education - 08/02/16 1637    Education provided Yes   Education Details HEP   Person(s) Educated Patient   Methods Explanation;Demonstration;Handout   Comprehension Verbalized understanding;Returned demonstration;Need further instruction             PT Long Term Goals - 07/22/16 0746      PT LONG TERM GOAL #1   Title independent with HEP (09/01/16)   Time 6   Period Weeks   Status New     PT LONG TERM GOAL #2   Title improve Rt shoulder strength to at least 4/5 for improved strength and function (09/01/16)   Time 6   Period Weeks   Status New     PT LONG TERM GOAL #3   Title verbalize understanding of postural awareness to decrease risk of  reinjury (09/01/16)   Time 6   Period Weeks   Status New     PT LONG TERM GOAL #4   Title report pain < 4/10 for improved function and quality of life (09/01/16)   Time 6   Period Weeks   Status New               Plan - 08/02/16 1639    Clinical Impression Statement Pt continues to have trigger points in Rt levator and upper trap with twitch responses in both muscles today.  Will continue to benefit from PT to maximize function and improve pain.   PT Treatment/Interventions Biofeedback   PT Next Visit Plan review HEP, DN, manual and modalities PRN   Consulted and Agree with Plan of Care Patient      Patient will benefit from skilled therapeutic intervention in order to improve the following deficits and impairments:  Increased muscle spasms, Increased fascial restricitons, Impaired UE functional use, Postural dysfunction, Decreased strength  Visit Diagnosis: Chronic right shoulder pain  Muscle weakness (generalized)  Abnormal posture  Myalgia     Problem List Patient Active Problem List   Diagnosis Date Noted  . Trigger point of right shoulder region 05/05/2016  . Spondylosis of cervical region without myelopathy or radiculopathy 05/05/2016  . Chondrocalcinosis 05/05/2016  . Chronic pain disorder 05/05/2016  . A-fib ([place]) 01/08/2016  . Closed fracture of shaft of right femur, initial encounter ([place]) 10/25/2015  . Status post-operative repair of hip fracture 10/25/2015  . Greater trochanteric bursitis of right hip 09/29/2015  . Asymptomatic cholelithiasis 05/09/2015  . Essential hypertension 11/07/2014  . Hair loss 11/07/2014  . Bilateral leg edema 11/07/2014  . Constipation due to pain medication 11/06/2014  . Hypotension due to drugs 11/06/2014  . Chest pain 09/01/2014  . Osteoporosis 01/04/2014  . Gout 01/04/2014  . Headache 01/04/2014  . Hypothyroidism 11/19/2013  . Hypercalcemia 10/11/2013  . Insomnia 05/16/2013  . Lumbar back pain 05/16/2013  .  Polycythemia vera ([place])   . Neck mass   . Senile osteoporosis   . Macular degeneration   . Sick sinus syndrome ([place]) 03/09/2012  . CAD (coronary artery disease) 12/27/2011  . Paroxysmal atrial fibrillation ([place]) 12/27/2011  . Chronic diastolic CHF (congestive heart failure) ([place]) 12/27/2011  . HTN (hypertension) 12/27/2011      Laureen Abrahams, PT, DPT 08/02/16 4:46 PM    [facility] [place], [place], [address] Phone: [phone]   Fax:  [phone]  Name: RUBYLEE ZAMARRIPA MRN: [id number] Date of Birth: 10-25-1930

## 2016-08-03 ENCOUNTER — Other Ambulatory Visit: Payer: Self-pay | Admitting: Cardiology

## 2016-08-09 ENCOUNTER — Ambulatory Visit (INDEPENDENT_AMBULATORY_CARE_PROVIDER_SITE_OTHER): Payer: Medicare Other | Admitting: Physical Therapy

## 2016-08-09 DIAGNOSIS — R293 Abnormal posture: Secondary | ICD-10-CM | POA: Diagnosis not present

## 2016-08-09 DIAGNOSIS — M6281 Muscle weakness (generalized): Secondary | ICD-10-CM | POA: Diagnosis not present

## 2016-08-09 DIAGNOSIS — M791 Myalgia, unspecified site: Secondary | ICD-10-CM

## 2016-08-09 DIAGNOSIS — G8929 Other chronic pain: Secondary | ICD-10-CM

## 2016-08-09 DIAGNOSIS — M25511 Pain in right shoulder: Secondary | ICD-10-CM | POA: Diagnosis not present

## 2016-08-09 NOTE — Therapy (Signed)
[facility] [address] [place], [place], [address] Phone: [phone]   Fax:  [phone]  Physical Therapy Treatment  Patient Details  Name: Sarah Mays MRN: [id number] Date of Birth: 08-19-1930 Referring Provider: Dr. Teresa Coombs  Encounter Date: 08/09/2016      PT End of Session - 08/09/16 1638    Visit Number 4   Number of Visits 6   Date for PT Re-Evaluation 09/01/16   PT Start Time 3419  hot pack   PT Stop Time 1644   PT Time Calculation (min) 40 min   Activity Tolerance Patient tolerated treatment well   Behavior During Therapy [facility] for tasks assessed/performed      Past Medical History:  Diagnosis Date  . Abnormal stress test    a. 11/2011 Ex MV: EF 80%, small, partially reversible anteroapical defect->mild ischemia vs attenuation-->med Rx.  [name] Kitchen Alopecia, unspecified   . Chronic diastolic CHF (congestive heart failure) ([place])    a. 11/2013 Echo: EF 60-65%, no rwma, mild TR, PASP 44mHg.  . Closed fracture of lower end of radius with ulna   . Deafness    Left, s/p multiple surgeries  . Essential hypertension   . Gouty arthropathy, unspecified   . Hypothyroidism   . Insomnia, unspecified   . Macular degeneration    Left, s/p inj. tx  . Mitral valve disorders(424.0)   . Neck mass    right, w/u including MRI negative  . PAF (paroxysmal atrial fibrillation) (H[facility])    a. on amio (02/2012 mild obstruction on PFT's) and xarelto.  . Polycythemia vera(238.4)   . Pure hypercholesterolemia   . Senile osteoporosis    Reclast in the past  . Tricuspid valve disorders, specified as nonrheumatic     Past Surgical History:  Procedure Laterality Date  . ABLATION  08/28/14   Back   . BREAST BIOPSY  06/18/1998   left  . BREAST BIOPSY    . ELECTROPHYSIOLOGIC STUDY N/A 01/08/2016   Procedure: Atrial Fibrillation Ablation;  Surgeon: JThompson Grayer MD;  [facility];  Service: Cardiovascular;  Laterality: N/A;   . FEMUR IM NAIL Right 10/08/2015   Procedure: INTRAMEDULLARY (IM) NAIL FEMORAL RIGHT;  Surgeon: MParalee Cancel MD;  [facility];  Service: Orthopedics;  Laterality: Right;  . TONSILLECTOMY    . TOTAL ABDOMINAL HYSTERECTOMY    . TYMPANOPLASTY Bilateral     There were no vitals filed for this visit.      Subjective Assessment - 08/09/16 1606    Subjective feels pretty good; thinks trigger points are still there but not as noticable.   Patient Stated Goals improve pain and function   Currently in Pain? No/denies                         [facility] - 08/09/16 1611      Shoulder Exercises: Supine   Horizontal ABduction Both;10 reps;Theraband   Theraband Level (Shoulder Horizontal ABduction) Level 2 (Red)   External Rotation Both;10 reps;Theraband   Theraband Level (Shoulder External Rotation) Level 2 (Red)   Flexion Both;10 reps;Theraband   Theraband Level (Shoulder Flexion) Level 2 (Red)   Other Supine Exercises cervical retraction x 10 reps     Shoulder Exercises: Standing   Horizontal ABduction Both;10 reps;Theraband   Theraband Level (Shoulder Horizontal ABduction) Level 2 (Red)   External Rotation Both;10 reps;Theraband   Theraband Level (Shoulder External Rotation) Level 2 (Red)  Retraction Both;15 reps;Theraband   Theraband Level (Shoulder Retraction) Level 2 (Red)     Modalities   Modalities Moist Heat     Moist Heat Therapy   Number Minutes Moist Heat 10 Minutes   Moist Heat Location Cervical     Manual Therapy   Manual Therapy Soft tissue mobilization;Myofascial release   Soft tissue mobilization Rt levator scapula and upper trap   Myofascial Release Trigger point release to Rt upper trap, levator and cervical paraspinals                PT Education - 08-31-2016 1637    Education provided Yes   Education Details reviewed HEP, instructed could perform horizontal ABD and er supine or standing   Person(s)  Educated Patient   Methods Explanation;Demonstration   Comprehension Verbalized understanding;Returned demonstration             PT Long Term Goals - Aug 31, 2016 1638      PT LONG TERM GOAL #1   Title independent with HEP (09/01/16)   Baseline 2016/08/31: met   Status Achieved     PT LONG TERM GOAL #2   Title improve Rt shoulder strength to at least 4/5 for improved strength and function (09/01/16)   Status On-going     PT LONG TERM GOAL #3   Title verbalize understanding of postural awareness to decrease risk of reinjury (09/01/16)   Status Achieved     PT LONG TERM GOAL #4   Title report pain < 4/10 for improved function and quality of life (09/01/16)   Baseline 08-31-2016: no pain x 5 days   Status Achieved               Plan - 08/31/16 1639    Clinical Impression Statement Pt reports she's been doing well without any disruption to ADLs x 1 week and is progressing well.  At this time she still has trigger points but currently latent.  Has HEP to address postural dysfunction and improve strength.  Plan to hold PT and pt will return if pain returns.   PT Next Visit Plan hold x 30 days; will need new gcode and recert if pt returns   Consulted and Agree with Plan of Care Patient      Patient will benefit from skilled therapeutic intervention in order to improve the following deficits and impairments:  Increased muscle spasms, Increased fascial restricitons, Impaired UE functional use, Postural dysfunction, Decreased strength  Visit Diagnosis: Chronic right shoulder pain  Muscle weakness (generalized)  Abnormal posture  Myalgia       [facility] PT PB G-CODES - Aug 31, 2016 1641    Functional Assessment Tool Used  clinical judgement   Functional Limitations Self care   Self Care Goal Status (O1308) At least 1 percent but less than 20 percent impaired, limited or restricted   Self Care Discharge Status [phone]) At least 1 percent but less than 20 percent impaired, limited or  restricted      Problem List Patient Active Problem List   Diagnosis Date Noted  . Trigger point of right shoulder region 05/05/2016  . Spondylosis of cervical region without myelopathy or radiculopathy 05/05/2016  . Chondrocalcinosis 05/05/2016  . Chronic pain disorder 05/05/2016  . A-fib ([place]) 01/08/2016  . Closed fracture of shaft of right femur, initial encounter ([place]) 10/25/2015  . Status post-operative repair of hip fracture 10/25/2015  . Greater trochanteric bursitis of right hip 09/29/2015  . Asymptomatic cholelithiasis 05/09/2015  . Essential hypertension 11/07/2014  .  Hair loss 11/07/2014  . Bilateral leg edema 11/07/2014  . Constipation due to pain medication 11/06/2014  . Hypotension due to drugs 11/06/2014  . Chest pain 09/01/2014  . Osteoporosis 01/04/2014  . Gout 01/04/2014  . Headache 01/04/2014  . Hypothyroidism 11/19/2013  . Hypercalcemia 10/11/2013  . Insomnia 05/16/2013  . Lumbar back pain 05/16/2013  . Polycythemia vera ([place])   . Neck mass   . Senile osteoporosis   . Macular degeneration   . Sick sinus syndrome ([place]) 03/09/2012  . CAD (coronary artery disease) 12/27/2011  . Paroxysmal atrial fibrillation ([place]) 12/27/2011  . Chronic diastolic CHF (congestive heart failure) ([place]) 12/27/2011  . HTN (hypertension) 12/27/2011      Laureen Abrahams, PT, DPT 08/09/16 4:43 PM    [place] [address] [place], [place], [address] Phone: [phone]   Fax:  [phone]  Name: Sarah Mays MRN: [id number] Date of Birth: 1931/01/31

## 2016-08-10 DIAGNOSIS — H353222 Exudative age-related macular degeneration, left eye, with inactive choroidal neovascularization: Secondary | ICD-10-CM | POA: Diagnosis not present

## 2016-08-10 DIAGNOSIS — H353124 Nonexudative age-related macular degeneration, left eye, advanced atrophic with subfoveal involvement: Secondary | ICD-10-CM | POA: Diagnosis not present

## 2016-08-10 DIAGNOSIS — H353112 Nonexudative age-related macular degeneration, right eye, intermediate dry stage: Secondary | ICD-10-CM | POA: Diagnosis not present

## 2016-08-10 DIAGNOSIS — H353212 Exudative age-related macular degeneration, right eye, with inactive choroidal neovascularization: Secondary | ICD-10-CM | POA: Diagnosis not present

## 2016-08-12 ENCOUNTER — Telehealth: Payer: Self-pay | Admitting: Physical Therapy

## 2016-08-12 NOTE — Telephone Encounter (Signed)
Patient requested a phone call from [facility] directly. Patient did not give any further information. Please call patient to advise.

## 2016-08-18 ENCOUNTER — Other Ambulatory Visit: Payer: Self-pay | Admitting: *Deleted

## 2016-08-18 MED ORDER — FENTANYL 25 MCG/HR TD PT72: 25.0000 ug | MEDICATED_PATCH | TRANSDERMAL | 0 refills | Status: DC

## 2016-08-18 NOTE — Telephone Encounter (Signed)
rx handwritten out on script pad and gave to patient at [facility]

## 2016-08-20 ENCOUNTER — Ambulatory Visit: Payer: Medicare Other | Admitting: Internal Medicine

## 2016-08-27 DIAGNOSIS — M47816 Spondylosis without myelopathy or radiculopathy, lumbar region: Secondary | ICD-10-CM | POA: Diagnosis not present

## 2016-08-30 ENCOUNTER — Other Ambulatory Visit: Payer: Self-pay | Admitting: Internal Medicine

## 2016-08-31 DIAGNOSIS — M47816 Spondylosis without myelopathy or radiculopathy, lumbar region: Secondary | ICD-10-CM | POA: Diagnosis not present

## 2016-09-01 ENCOUNTER — Other Ambulatory Visit: Payer: Self-pay | Admitting: Internal Medicine

## 2016-09-01 DIAGNOSIS — Z23 Encounter for immunization: Secondary | ICD-10-CM

## 2016-09-01 MED ORDER — ZOSTER VAC RECOMB ADJUVANTED 50 MCG/0.5ML IM SUSR: 0.5000 mL | Freq: Once | INTRAMUSCULAR | 1 refills | Status: AC

## 2016-09-01 NOTE — Progress Notes (Signed)
Pt requested her shingrix Rx be sent to pharmacy when she was here with her husband.

## 2016-09-06 ENCOUNTER — Ambulatory Visit (INDEPENDENT_AMBULATORY_CARE_PROVIDER_SITE_OTHER): Payer: Medicare Other | Admitting: Internal Medicine

## 2016-09-06 ENCOUNTER — Encounter: Payer: Self-pay | Admitting: Internal Medicine

## 2016-09-06 VITALS — BP 132/80 | HR 74 | Ht 60.0 in | Wt 112.0 lb

## 2016-09-06 DIAGNOSIS — I495 Sick sinus syndrome: Secondary | ICD-10-CM

## 2016-09-06 DIAGNOSIS — I5032 Chronic diastolic (congestive) heart failure: Secondary | ICD-10-CM

## 2016-09-06 DIAGNOSIS — I480 Paroxysmal atrial fibrillation: Secondary | ICD-10-CM

## 2016-09-06 NOTE — Progress Notes (Signed)
PCP: Gayland Curry, DO Primary Cardiologist: Dr Allie Bossier is a 83 y.o. female who presents today for routine electrophysiology followup.  Since last being seen in our clinic, the patient reports doing very well.  She is unaware of any afib episodes.  Very pleased with current health state. Today, she denies symptoms of palpitations, chest pain, shortness of breath,  lower extremity edema, dizziness, presyncope, or syncope.  The patient is otherwise without complaint today.   Past Medical History:  Diagnosis Date  . Abnormal stress test    a. 11/2011 Ex MV: EF 80%, small, partially reversible anteroapical defect->mild ischemia vs attenuation-->med Rx.  [name] Kitchen Alopecia, unspecified   . Chronic diastolic CHF (congestive heart failure) ([place])    a. 11/2013 Echo: EF 60-65%, no rwma, mild TR, PASP 24mmHg.  . Closed fracture of lower end of radius with ulna   . Deafness    Left, s/p multiple surgeries  . Essential hypertension   . Gouty arthropathy, unspecified   . Hypothyroidism   . Insomnia, unspecified   . Macular degeneration    Left, s/p inj. tx  . Mitral valve disorders(424.0)   . Neck mass    right, w/u including MRI negative  . PAF (paroxysmal atrial fibrillation) ([facility])    a. on amio (02/2012 mild obstruction on PFT's) and xarelto.  . Polycythemia vera(238.4)   . Pure hypercholesterolemia   . Senile osteoporosis    Reclast in the past  . Tricuspid valve disorders, specified as nonrheumatic    Past Surgical History:  Procedure Laterality Date  . ABLATION  08/28/14   Back   . BREAST BIOPSY  06/18/1998   left  . BREAST BIOPSY    . ELECTROPHYSIOLOGIC STUDY N/A 01/08/2016   Procedure: Atrial Fibrillation Ablation;  Surgeon: Thompson Grayer, MD;  Location: [facility];  Service: Cardiovascular;  Laterality: N/A;  . FEMUR IM NAIL Right 10/08/2015   Procedure: INTRAMEDULLARY (IM) NAIL FEMORAL RIGHT;  Surgeon: Paralee Cancel, MD;  [facility];  Service:  Orthopedics;  Laterality: Right;  . TONSILLECTOMY    . TOTAL ABDOMINAL HYSTERECTOMY    . TYMPANOPLASTY Bilateral     ROS- all systems are reviewed and negatives except as per HPI above  Current Outpatient Prescriptions  Medication Sig Dispense Refill  . cholecalciferol (VITAMIN D) 1000 UNITS tablet Take 2,000 Units by mouth daily.     . fentaNYL (DURAGESIC - DOSED MCG/HR) 25 MCG/HR patch Place 1 patch (25 mcg total) onto the skin every 3 (three) days. 10 patch 0  . fluticasone (FLONASE) 50 MCG/ACT nasal spray Place 2 sprays into both nostrils 2 (two) times daily.    . furosemide (LASIX) 40 MG tablet Take 80 mg by mouth every morning 180 tablet 2  . gabapentin (NEURONTIN) 100 MG capsule Take 300 mg by mouth at bedtime.     . hydrALAZINE (APRESOLINE) 50 MG tablet Take 1.5 tablets (75 mg total) by mouth 3 (three) times daily. 135 tablet 3  . hydroxyurea (HYDREA) 500 MG capsule May take with food to minimize GI side effects. Take 2 capsules by mouth on Mon, Wed, and Fri and take 1 capsule on all other days    . levothyroxine (SYNTHROID, LEVOTHROID) 112 MCG tablet TAKE 1 TABLET EACH DAY. 30 tablet 5  . Magnesium 250 MG TABS Take 250 mg by mouth daily.     . Melatonin 10 MG TABS Take 10 mg by mouth at bedtime.    . Multiple  Vitamins-Minerals (PRESERVISION AREDS 2) CAPS Take 2 capsules by mouth daily.     . polycarbophil (FIBERCON) 625 MG tablet Take 2 tablets (1,250 mg total) by mouth 2 (two) times daily. 60 tablet 0  . traMADol (ULTRAM) 50 MG tablet TAKE ONE TABLET AT BEDTIME. 30 tablet 0  . XARELTO 15 MG TABS tablet TAKE 1 TABLET ONCE DAILY. 30 tablet 6   No current facility-administered medications for this visit.     Physical Exam: Vitals:   09/06/16 1213  BP: 132/80  Pulse: 74  SpO2: 97%  Weight: 112 lb (50.8 kg)  Height: 5' (1.524 m)    GEN- The patient is well appearing, alert and oriented x 3 today.   Head- normocephalic, atraumatic Eyes-  Sclera clear, conjunctiva  pink Ears- hearing intact Oropharynx- clear Lungs- Clear to ausculation bilaterally, normal work of breathing Heart- Regular rate and rhythm, no murmurs, rubs or gallops, PMI not laterally displaced GI- soft, NT, ND, + BS Extremities- no clubbing, cyanosis, or edema  EKG tracing ordered today is personally reviewed and shows sinus rhythm with PACs, 74 bpm, PR 200 msec, incomplete RBBB, LVH  Assessment and Plan:  1. Paroxysmal atrial fibrillation Doing well s/p ablation off of amiodarone therapy chads2vasc score is 4.  Continue on xarelto  2. Sick sinus syndrome Much improved off of amiodarone No indication for pacing currently  3. HTN Stable No change required today  4. Chronic diastolic dysfunction euvolemic today No changes  Return to see me in 4 months unless problems arise   Thompson Grayer MD, [facility] 09/06/2016 12:30 PM

## 2016-09-06 NOTE — Patient Instructions (Signed)
Medication Instructions:  Your physician recommends that you continue on your current medications as directed. Please refer to the Current Medication list given to you today.  Labwork: None ordered.  Testing/Procedures: None ordered.  Follow-Up: Your physician wants you to follow-up in: 4 months with Dr. Allred.      Any Other Special Instructions Will Be Listed Below (If Applicable).  If you need a refill on your cardiac medications before your next appointment, please call your pharmacy.   

## 2016-09-06 NOTE — Progress Notes (Signed)
[facility] [place], [place] [address] Phone: [phone] Subjective:    I'm seeing this patient by the request  of:    CC: Right shoulder pain  HGD:[id number]  Sarah Mays is a 83 y.o. female coming in with complaint of right shoulder pain. Patient has had trigger point injections in the shoulder nearly 2 months ago. Patient states This all for some time. Worsening symptoms. Seems to be radiating up towards the neck and somewhat down the arm. Seems to be deeper in the shoulder this time. States that certain daily activities such as getting dress has become more difficult. Rates the severity pain is 8 out of 10.     Past Medical History:  Diagnosis Date  . Abnormal stress test    a. 11/2011 Ex MV: EF 80%, small, partially reversible anteroapical defect->mild ischemia vs attenuation-->med Rx.  [name] Kitchen Alopecia, unspecified   . Chronic diastolic CHF (congestive heart failure) ([place])    a. 11/2013 Echo: EF 60-65%, no rwma, mild TR, PASP 75mmHg.  . Closed fracture of lower end of radius with ulna   . Deafness    Left, s/p multiple surgeries  . Essential hypertension   . Gouty arthropathy, unspecified   . Hypothyroidism   . Insomnia, unspecified   . Macular degeneration    Left, s/p inj. tx  . Mitral valve disorders(424.0)   . Neck mass    right, w/u including MRI negative  . PAF (paroxysmal atrial fibrillation) (HCC)    a. on amio (02/2012 mild obstruction on PFT's) and xarelto.  . Polycythemia vera(238.4)   . Pure hypercholesterolemia   . Senile osteoporosis    Reclast in the past  . Tricuspid valve disorders, specified as nonrheumatic    Past Surgical History:  Procedure Laterality Date  . ABLATION  08/28/14   Back   . BREAST BIOPSY  06/18/1998   left  . BREAST BIOPSY    . ELECTROPHYSIOLOGIC STUDY N/A 01/08/2016   Procedure: Atrial Fibrillation Ablation;  Surgeon: Thompson Grayer, MD;  Location: [facility];  Service:  Cardiovascular;  Laterality: N/A;  . FEMUR IM NAIL Right 10/08/2015   Procedure: INTRAMEDULLARY (IM) NAIL FEMORAL RIGHT;  Surgeon: Paralee Cancel, MD;  Location: [facility];  Service: Orthopedics;  Laterality: Right;  . TONSILLECTOMY    . TOTAL ABDOMINAL HYSTERECTOMY    . TYMPANOPLASTY Bilateral    Social History   Social History  . Marital status: Married    Spouse name: [name]  . Number of children: 2  . Years of education: 29   Social History Main Topics  . Smoking status: Former Smoker    Types: Cigarettes    Quit date: 01/19/1984  . Smokeless tobacco: Never Used  . Alcohol use 0.6 oz/week    1 Glasses of wine per week     Comment: 2 per day/ 14 a week  . Drug use: No  . Sexual activity: Not Currently   Other Topics Concern  . Not on file   Social History Narrative   Patient is Married 1955. 2 kids. 50 grandkid-74 years old in 2016. College graduate [name] [place]).    Lives in apartment,  Independent Living  section at [facility] since 02/2013.      Stay at home mother.       No Smoking history, Mod. alcohol use.   Patient has a living will, POA      Hobbies: CPU and painting  Allergies  Allergen Reactions  . Amlodipine Swelling and Other (See Comments)    Reaction:  Lower extremity swelling    Family History  Problem Relation Age of Onset  . Hypertension Father   . Heart disease Father        patient does not know details.   . Ovarian cancer Mother 18  . Cancer Sister        colon  . Heart disease Sister   . Cancer Sister   . Cancer Sister        hodgkin disease  . Breast cancer Daughter   . Cancer Daughter        breast     Past medical history, social, surgical and family history all reviewed in electronic medical record.  No pertanent information unless stated regarding to the chief complaint.   Review of Systems:Review of systems updated and as accurate as of 09/06/16  No headache, visual changes, nausea, vomiting,  diarrhea, constipation, dizziness, abdominal pain, skin rash, fevers, chills, night sweats, weight loss, swollen lymph nodes, body aches, joint swelling, chest pain, shortness of breath, mood changes. Positive muscle aches  Objective  There were no vitals taken for this visit. Systems examined below as of 09/06/16   General: No apparent distress alert and oriented x3 mood and affect normal, dressed appropriately.  HEENT: Pupils equal, extraocular movements intact  Respiratory: Patient's speak in full sentences and does not appear short of breath  Cardiovascular: No lower extremity edema, non tender, no erythema  Skin: Warm dry intact with no signs of infection or rash on extremities or on axial skeleton.  Abdomen: Soft nontender  Neuro: Cranial nerves II through XII are intact, neurovascularly intact in all extremities with 2+ DTRs and 2+ pulses.  Lymph: No lymphadenopathy of posterior or anterior cervical chain or axillae bilaterally.  Gait normal with good balance and coordination.  MSK:  Non tender with full range of motion and good stability and symmetric strength and tone of  elbows, wrist, hip, knee and ankles bilaterally.  Shoulder: Right Inspection atrophy noted Palpation is normal with no tenderness over AC joint or bicipital groove. ROM 4 out of 5 strength and mild limitation.  signs of impingement with positive Neer and Hawkin's tests, but negative empty can sign. Speeds and Yergason's tests normal. No labral pathology noted with negative Obrien's, negative clunk and good stability. Normal scapular function observed. Positive painful arc No apprehension sign Contralateral shoulder unremarkable  After informed written and verbal consent, patient was seated on exam table. Right shoulder was prepped with alcohol swab and utilizing posterior approach, patient's right glenohumeral space was injected with 4:1  marcaine 0.5%: Kenalog 40mg /dL. Patient tolerated the procedure well  without immediate complications.       Impression and Recommendations:     This case required medical decision making of moderate complexity.      Note: This dictation was prepared with Dragon dictation along with smaller phrase technology. Any transcriptional errors that result from this process are unintentional.

## 2016-09-07 ENCOUNTER — Ambulatory Visit (INDEPENDENT_AMBULATORY_CARE_PROVIDER_SITE_OTHER): Payer: Medicare Other | Admitting: Physical Therapy

## 2016-09-07 DIAGNOSIS — M791 Myalgia, unspecified site: Secondary | ICD-10-CM

## 2016-09-07 DIAGNOSIS — R293 Abnormal posture: Secondary | ICD-10-CM

## 2016-09-07 DIAGNOSIS — G8929 Other chronic pain: Secondary | ICD-10-CM | POA: Diagnosis not present

## 2016-09-07 DIAGNOSIS — M6281 Muscle weakness (generalized): Secondary | ICD-10-CM

## 2016-09-07 DIAGNOSIS — M25511 Pain in right shoulder: Secondary | ICD-10-CM | POA: Diagnosis not present

## 2016-09-07 NOTE — Therapy (Addendum)
[facility] [address] [place], [place], [address] Phone: [phone]   Fax:  [phone]  Physical Therapy Treatment/Recertification  Patient Details  Name: Sarah Mays MRN: [id number] Date of Birth: [birth year] Referring Provider: Dr. Teresa Coombs  Encounter Date: 09/07/2016      PT End of Session - 09/07/16 1053    Visit Number 5   Number of Visits 9   Date for PT Re-Evaluation 10/05/16   PT Start Time 9528   PT Stop Time 1100   PT Time Calculation (min) 45 min   Activity Tolerance Patient tolerated treatment well   Behavior During Therapy [facility] for tasks assessed/performed      Past Medical History:  Diagnosis Date  . Abnormal stress test    a. 11/2011 Ex MV: EF 80%, small, partially reversible anteroapical defect->mild ischemia vs attenuation-->med Rx.  [name] Kitchen Alopecia, unspecified   . Chronic diastolic CHF (congestive heart failure) ([facility])    a. 11/2013 Echo: EF 60-65%, no rwma, mild TR, PASP 16mHg.  . Closed fracture of lower end of radius with ulna   . Deafness    Left, s/p multiple surgeries  . Essential hypertension   . Gouty arthropathy, unspecified   . Hypothyroidism   . Insomnia, unspecified   . Macular degeneration    Left, s/p inj. tx  . Mitral valve disorders(424.0)   . Neck mass    right, w/u including MRI negative  . PAF (paroxysmal atrial fibrillation) ([facility])    a. on amio (02/2012 mild obstruction on PFT's) and xarelto.  . Polycythemia vera(238.4)   . Pure hypercholesterolemia   . Senile osteoporosis    Reclast in the past  . Tricuspid valve disorders, specified as nonrheumatic     Past Surgical History:  Procedure Laterality Date  . ABLATION  08/28/14   Back   . BREAST BIOPSY  06/18/1998   left  . BREAST BIOPSY    . ELECTROPHYSIOLOGIC STUDY N/A 01/08/2016   Procedure: Atrial Fibrillation Ablation;  Surgeon: JThompson Grayer MD;  [facility];  Service: Cardiovascular;   Laterality: N/A;  . FEMUR IM NAIL Right 10/08/2015   Procedure: INTRAMEDULLARY (IM) NAIL FEMORAL RIGHT;  Surgeon: MParalee Cancel MD;  [facility];  Service: Orthopedics;  Laterality: Right;  . TONSILLECTOMY    . TOTAL ABDOMINAL HYSTERECTOMY    . TYMPANOPLASTY Bilateral     There were no vitals filed for this visit.      Subjective Assessment - 09/07/16 1050    Subjective doing really well until Friday-not sure what happened but she started having pain again.  has mostly subsided by now, but still some pain.   Patient Stated Goals improve pain and function   Pain Score --  did not rate   Pain Location Shoulder   Pain Orientation Right   Pain Descriptors / Indicators Aching   Pain Type Chronic pain   Pain Onset In the past 7 days   Pain Frequency Intermittent   Aggravating Factors  unknown at this time   Pain Relieving Factors injections, pain patch (for lower back)                         [facility] Adult PT Treatment/Exercise - 09/07/16 1051      Self-Care   Self-Care Other Self-Care Comments   Other Self-Care Comments  discussed current activities that could have possibly contributed to recent exacerbation: pt reports she has been doing a  lot of crafting looking down with minimal rest periods.  Recommended she take frequent rest breaks to help minimize risk of pain developing.  Pt understanding and in agreement.     Moist Heat Therapy   Number Minutes Moist Heat 15 Minutes   Moist Heat Location Cervical     Manual Therapy   Manual Therapy Soft tissue mobilization;Myofascial release   Soft tissue mobilization Rt levator scapula and upper trap   Myofascial Release Trigger point release to Rt upper trap, levator and cervical paraspinals          Trigger Point Dry Needling - October 03, 2016 1053    Consent Given? Yes   Education Handout Provided Yes  previous session   Muscles Treated Upper Body Levator scapulae;Upper trapezius   Upper Trapezius Response Twitch  reponse elicited;Palpable increased muscle length   Levator Scapulae Response Twitch response elicited;Palpable increased muscle length              PT Education - 2016-10-03 1053    Education provided Yes   Education Details see self care             PT Long Term Goals - 2016/10/03 1054      PT LONG TERM GOAL #1   Title independent with HEP (09/01/16)   Status Achieved     PT LONG TERM GOAL #2   Title improve Rt shoulder strength to at least 4/5 for improved strength and function   Time 4   Period Weeks   Status On-going   Target Date 10/05/16     PT LONG TERM GOAL #3   Title verbalize understanding of postural awareness to decrease risk of reinjury (09/01/16)   Status Achieved     PT LONG TERM GOAL #4   Title report pain < 2/10 for improved function and quality of life   Time 4   Period Weeks   Status Revised   Target Date 10/05/16               Plan - October 03, 2016 1055    Clinical Impression Statement Pt returns to PT after nearly 1 month of no increase in Rt shoulder pain but with recent exacerbation 4 days ago.  Pt demonstrates active trigger points in Rt levator and upper trap and good twitch responses noted with dry needling today in both locations.  At this time will plan to see up to 1x/wk x 4 weeks PRN for pain.     Clinical Presentation Stable   PT Treatment/Interventions ADLs/Self Care Home Management;Electrical Stimulation;Moist Heat;Cryotherapy;Therapeutic exercise;Therapeutic activities;Patient/family education;Manual techniques;Taping;Dry needling   PT Next Visit Plan reassess if pt returns   Consulted and Agree with Plan of Care Patient      Patient will benefit from skilled therapeutic intervention in order to improve the following deficits and impairments:  Increased muscle spasms, Increased fascial restricitons, Impaired UE functional use, Postural dysfunction, Decreased strength  Visit Diagnosis: Chronic right shoulder pain - Plan: PT plan  of care cert/re-cert  Muscle weakness (generalized) - Plan: PT plan of care cert/re-cert  Abnormal posture - Plan: PT plan of care cert/re-cert  Myalgia - Plan: PT plan of care cert/re-cert       [facility] PT PB G-CODES - 10-03-16 1058    Functional Assessment Tool Used  clinical judgement   Functional Limitations Carrying, moving and handling objects   Carrying, Moving and Handling Objects Current Status ([id number]) At least 1 percent but less than 20 percent impaired, limited or restricted  Carrying, Moving and Handling Objects Goal Status [phone]) At least 1 percent but less than 20 percent impaired, limited or restricted   Carrying, Moving and Handling Objects Discharge Status [phone]) At least 1 percent but less than 20 percent impaired, limited or restricted      Problem List Patient Active Problem List   Diagnosis Date Noted  . Trigger point of right shoulder region 05/05/2016  . Spondylosis of cervical region without myelopathy or radiculopathy 05/05/2016  . Chondrocalcinosis 05/05/2016  . Chronic pain disorder 05/05/2016  . A-fib ([facility]) 01/08/2016  . Closed fracture of shaft of right femur, initial encounter ([facility]) 10/25/2015  . Status post-operative repair of hip fracture 10/25/2015  . Greater trochanteric bursitis of right hip 09/29/2015  . Asymptomatic cholelithiasis 05/09/2015  . Essential hypertension 11/07/2014  . Hair loss 11/07/2014  . Bilateral leg edema 11/07/2014  . Constipation due to pain medication 11/06/2014  . Hypotension due to drugs 11/06/2014  . Chest pain 09/01/2014  . Osteoporosis 01/04/2014  . Gout 01/04/2014  . Headache 01/04/2014  . Hypothyroidism 11/19/2013  . Hypercalcemia 10/11/2013  . Insomnia 05/16/2013  . Lumbar back pain 05/16/2013  . Polycythemia vera ([facility])   . Neck mass   . Senile osteoporosis   . Macular degeneration   . Sick sinus syndrome ([facility]) 03/09/2012  . CAD (coronary artery disease) 12/27/2011  . Paroxysmal atrial fibrillation  ([facility]) 12/27/2011  . Chronic diastolic CHF (congestive heart failure) ([place]) 12/27/2011  . HTN (hypertension) 12/27/2011      Laureen Abrahams, PT, DPT 09/07/16 11:05 AM    [facility] [address] [place], [place], [address] Phone: [phone]   Fax:  [phone]  Name: Sarah Mays MRN: [id number] Date of Birth: 08/24/1930         PHYSICAL THERAPY DISCHARGE SUMMARY  Visits from Start of Care: 5  Current functional level related to goals / functional outcomes: See above   Remaining deficits: See above   Education / Equipment: HEP, TDN  Plan: Patient agrees to discharge.  Patient goals were partially met. Patient is being discharged due to being pleased with the current functional level.  ?????     Laureen Abrahams, PT, DPT 10/13/16 9:52 AM   [facility] [address] [place], [place], [address] Phone: [phone]  Fax: [phone]

## 2016-09-08 ENCOUNTER — Encounter: Payer: Self-pay | Admitting: Family Medicine

## 2016-09-08 ENCOUNTER — Ambulatory Visit (INDEPENDENT_AMBULATORY_CARE_PROVIDER_SITE_OTHER): Payer: Medicare Other | Admitting: Family Medicine

## 2016-09-08 ENCOUNTER — Telehealth: Payer: Self-pay | Admitting: Physical Therapy

## 2016-09-08 DIAGNOSIS — M12811 Other specific arthropathies, not elsewhere classified, right shoulder: Secondary | ICD-10-CM | POA: Insufficient documentation

## 2016-09-08 MED ORDER — ALLOPURINOL 100 MG PO TABS: 100.0000 mg | ORAL_TABLET | Freq: Every day | ORAL | 6 refills | Status: DC

## 2016-09-08 NOTE — Patient Instructions (Addendum)
Good to see you as always.  Ice is your friend.  Stay active.  Try to keep hands within peripheral vision allopurinol 100mg  daily to help with the potential pseudo gout.  Continue dry needling if you think it is helping.  See me again in 3 weeks and we will consider trigger points if needed.

## 2016-09-08 NOTE — Telephone Encounter (Signed)
Patient requesting that Colletta Maryland text her daughter Sarah Mays a picture of the ball from [employer]. [phone]. Please advise.

## 2016-09-08 NOTE — Assessment & Plan Note (Signed)
Patient given injection today and tolerated the procedure well. We discussed icing regimen and home exercises. We discussed objective is a doing which ones to avoid. Patient will increase activity as tolerated. Worsening symptoms we can consider further imaging but I do not think patient is a operative candidate. Trigger point injections could be beneficial.

## 2016-09-13 ENCOUNTER — Other Ambulatory Visit: Payer: Self-pay | Admitting: Hematology and Oncology

## 2016-09-13 ENCOUNTER — Other Ambulatory Visit: Payer: Self-pay | Admitting: Internal Medicine

## 2016-09-13 DIAGNOSIS — D45 Polycythemia vera: Secondary | ICD-10-CM

## 2016-09-14 ENCOUNTER — Ambulatory Visit (HOSPITAL_BASED_OUTPATIENT_CLINIC_OR_DEPARTMENT_OTHER): Payer: Medicare Other | Admitting: Hematology and Oncology

## 2016-09-14 ENCOUNTER — Telehealth: Payer: Self-pay | Admitting: *Deleted

## 2016-09-14 ENCOUNTER — Other Ambulatory Visit: Payer: Self-pay | Admitting: Internal Medicine

## 2016-09-14 ENCOUNTER — Telehealth: Payer: Self-pay

## 2016-09-14 ENCOUNTER — Other Ambulatory Visit (HOSPITAL_BASED_OUTPATIENT_CLINIC_OR_DEPARTMENT_OTHER): Payer: Medicare Other

## 2016-09-14 DIAGNOSIS — D45 Polycythemia vera: Secondary | ICD-10-CM | POA: Diagnosis not present

## 2016-09-14 LAB — COMPREHENSIVE METABOLIC PANEL
ALBUMIN: 4.6 g/dL (ref 3.5–5.0)
ALK PHOS: 62 U/L (ref 40–150)
ALT: 18 U/L (ref 0–55)
AST: 17 U/L (ref 5–34)
Anion Gap: 9 mEq/L (ref 3–11)
BUN: 14.7 mg/dL (ref 7.0–26.0)
CALCIUM: 11 mg/dL — AB (ref 8.4–10.4)
CHLORIDE: 97 meq/L — AB (ref 98–109)
CO2: 30 mEq/L — ABNORMAL HIGH (ref 22–29)
Creatinine: 0.8 mg/dL (ref 0.6–1.1)
EGFR: 67 mL/min/{1.73_m2} — AB (ref >90)
Glucose: 112 mg/dl (ref 70–140)
POTASSIUM: 4 meq/L (ref 3.5–5.1)
Sodium: 136 mEq/L (ref 136–145)
Total Bilirubin: 1 mg/dL (ref 0.20–1.20)
Total Protein: 7 g/dL (ref 6.4–8.3)

## 2016-09-14 LAB — CBC WITH DIFFERENTIAL/PLATELET
BASO%: 0.5 % (ref 0.0–2.0)
BASOS ABS: 0.1 10*3/uL (ref 0.0–0.1)
EOS ABS: 0.1 10*3/uL (ref 0.0–0.5)
EOS%: 1.2 % (ref 0.0–7.0)
HCT: 42.3 % (ref 34.8–46.6)
HEMOGLOBIN: 14.3 g/dL (ref 11.6–15.9)
LYMPH%: 17.3 % (ref 14.0–49.7)
MCH: 38.9 pg — AB (ref 25.1–34.0)
MCHC: 33.8 g/dL (ref 31.5–36.0)
MCV: 114.9 fL — AB (ref 79.5–101.0)
MONO#: 0.9 10*3/uL (ref 0.1–0.9)
MONO%: 8.3 % (ref 0.0–14.0)
NEUT#: 7.7 10*3/uL — ABNORMAL HIGH (ref 1.5–6.5)
NEUT%: 72.7 % (ref 38.4–76.8)
Platelets: 501 10*3/uL — ABNORMAL HIGH (ref 145–400)
RBC: 3.68 10*6/uL — ABNORMAL LOW (ref 3.70–5.45)
RDW: 14.7 % — AB (ref 11.2–14.5)
WBC: 10.6 10*3/uL — ABNORMAL HIGH (ref 3.9–10.3)
lymph#: 1.8 10*3/uL (ref 0.9–3.3)

## 2016-09-14 NOTE — Telephone Encounter (Signed)
-----   Message from Heath Lark, MD sent at 09/14/2016  4:00 PM EDT ----- Regarding: high calcium She has high calcium level again, cause unknown Please let her know Recommend PCP to see and assess or refer her to endocrinology. ----- Message ----- From: Interface, Lab In Three Zero One Sent: 09/14/2016   2:50 PM To: Heath Lark, MD

## 2016-09-14 NOTE — Telephone Encounter (Signed)
Patient called requested to pick up Fentanyl Rx from [facility]. Please call patient when ready #: [phone]

## 2016-09-14 NOTE — Telephone Encounter (Signed)
Notified of message below. Will follow up with PCP 

## 2016-09-14 NOTE — Telephone Encounter (Signed)
appts made and as pritned for patient  Sarah Mays

## 2016-09-15 MED ORDER — FENTANYL 25 MCG/HR TD PT72: 25.0000 ug | MEDICATED_PATCH | TRANSDERMAL | 0 refills | Status: DC

## 2016-09-15 NOTE — Telephone Encounter (Signed)
Handwritten script gave to patient at [facility].

## 2016-09-16 ENCOUNTER — Encounter: Payer: Self-pay | Admitting: Hematology and Oncology

## 2016-09-16 ENCOUNTER — Telehealth (HOSPITAL_COMMUNITY): Payer: Self-pay | Admitting: *Deleted

## 2016-09-16 NOTE — Telephone Encounter (Signed)
Spoke with patient and the rx was put in her mailbox.

## 2016-09-16 NOTE — Progress Notes (Signed)
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 Bone marrow aspirate and biopsy was not performed.      08/12/2011 -  Chemotherapy    She is started on hydroxyurea with periodic phlebotomy.       INTERVAL HISTORY: Please see below for problem oriented charting. She returns for further follow-up She feels well Denies recent infection She is compliant taking her medications as directed She remain on chronic anticoagulation therapy for chronic atrial fibrillation The patient denies any recent signs or symptoms of bleeding such as spontaneous epistaxis, hematuria or hematochezia.   REVIEW OF SYSTEMS:   Constitutional: Denies fevers, chills or abnormal weight loss Eyes: Denies blurriness of vision Ears, nose, mouth, throat, and face: Denies mucositis or sore throat Respiratory: Denies cough, dyspnea or wheezes Cardiovascular: Denies palpitation, chest discomfort or lower extremity swelling Gastrointestinal:  Denies nausea, heartburn or change in bowel habits Skin: Denies abnormal skin rashes Lymphatics: Denies new lymphadenopathy or easy bruising Neurological:Denies numbness, tingling or new weaknesses Behavioral/Psych: Mood is stable, no new changes  All other systems were reviewed with the patient and are negative.  I have reviewed the past medical history, past surgical history, social history and  family history with the patient and they are unchanged from previous note.  ALLERGIES:  is allergic to amlodipine.  MEDICATIONS:  Current Outpatient Prescriptions  Medication Sig Dispense Refill  . allopurinol (ZYLOPRIM) 100 MG tablet Take 1 tablet (100 mg total) by mouth daily. 30 tablet 6  . cholecalciferol (VITAMIN D) 1000 UNITS tablet Take 2,000 Units by mouth daily.     . fentaNYL (DURAGESIC - DOSED MCG/HR) 25 MCG/HR patch Place 1 patch (25 mcg total) onto the skin every 3 (three) days. 10 patch 0  . fluticasone (FLONASE) 50 MCG/ACT nasal spray Place 2 sprays into both nostrils 2 (two) times daily.    . furosemide (LASIX) 40 MG tablet Take 80 mg by mouth every morning 180 tablet 2  . gabapentin (NEURONTIN) 100 MG capsule Take 300 mg by mouth at bedtime.     . hydrALAZINE (APRESOLINE) 50 MG tablet Take 1.5 tablets (75 mg total) by mouth 3 (three) times daily. 135 tablet 3  . hydroxyurea (HYDREA) 500 MG capsule May take with food to minimize GI side effects. Take 2 capsules by mouth on Mon, Wed, and Fri and take 1 capsule on all other days    . levothyroxine (SYNTHROID, LEVOTHROID) 112 MCG tablet TAKE 1 TABLET EACH DAY. 30 tablet 5  . Magnesium 250 MG TABS Take 250 mg by mouth daily.     . Melatonin 10 MG TABS Take 10 mg by mouth at bedtime.    . Multiple Vitamins-Minerals (PRESERVISION AREDS 2) CAPS Take 2 capsules by mouth daily.     . polycarbophil (FIBERCON) 625 MG tablet Take 2 tablets (1,250 mg total) by mouth 2 (two) times daily. 60 tablet 0  . potassium chloride (K-DUR) 10 MEQ tablet TAKE 1 TABLET TWICE DAILY. 60 tablet 0  . traMADol (ULTRAM) 50  MG tablet TAKE ONE TABLET AT BEDTIME. 30 tablet 0  . XARELTO 15 MG TABS tablet TAKE 1 TABLET ONCE DAILY. 30 tablet 6   No current facility-administered medications for this visit.     PHYSICAL EXAMINATION: ECOG PERFORMANCE STATUS: 1 - Symptomatic but completely ambulatory  Vitals:   09/14/16 1442  BP: (!) 107/50  Pulse: 91  Resp:  20  Temp: 98 F (36.7 C)  SpO2: 98%   Filed Weights   09/14/16 1442  Weight: 114 lb 11.2 oz (52 kg)    GENERAL:alert, no distress and comfortable SKIN: skin color, texture, turgor are normal, no rashes or significant lesions EYES: normal, Conjunctiva are pink and non-injected, sclera clear OROPHARYNX:no exudate, no erythema and lips, buccal mucosa, and tongue normal  NECK: supple, thyroid normal size, non-tender, without nodularity LYMPH:  no palpable lymphadenopathy in the cervical, axillary or inguinal LUNGS: clear to auscultation and percussion with normal breathing effort HEART: Irregular rate and rhythm, no murmurs and no lower extremity edema ABDOMEN:abdomen soft, non-tender and normal bowel sounds Musculoskeletal:no cyanosis of digits and no clubbing  NEURO: alert & oriented x 3 with fluent speech, no focal motor/sensory deficits  LABORATORY DATA:  I have reviewed the data as listed    Component Value Date/Time   NA 136 09/14/2016 1428   K 4.0 09/14/2016 1428   CL 99 05/24/2016 1527   CO2 30 (H) 09/14/2016 1428   GLUCOSE 112 09/14/2016 1428   BUN 14.7 09/14/2016 1428   CREATININE 0.8 09/14/2016 1428   CALCIUM 11.0 (H) 09/14/2016 1428   PROT 7.0 09/14/2016 1428   ALBUMIN 4.6 09/14/2016 1428   AST 17 09/14/2016 1428   ALT 18 09/14/2016 1428   ALKPHOS 62 09/14/2016 1428   BILITOT 1.00 09/14/2016 1428   GFRNONAA 59 (L) 05/24/2016 1527   GFRAA 67 05/24/2016 1527    No results found for: SPEP, UPEP  Lab Results  Component Value Date   WBC 10.6 (H) 09/14/2016   NEUTROABS 7.7 (H) 09/14/2016   HGB 14.3 09/14/2016   HCT 42.3 09/14/2016   MCV 114.9 (H) 09/14/2016   PLT 501 (H) 09/14/2016      Chemistry      Component Value Date/Time   NA 136 09/14/2016 1428   K 4.0 09/14/2016 1428   CL 99 05/24/2016 1527   CO2 30 (H) 09/14/2016 1428   BUN 14.7 09/14/2016 1428   CREATININE 0.8 09/14/2016 1428   GLU 80 10/29/2014      Component Value Date/Time   CALCIUM  11.0 (H) 09/14/2016 1428   ALKPHOS 62 09/14/2016 1428   AST 17 09/14/2016 1428   ALT 18 09/14/2016 1428   BILITOT 1.00 09/14/2016 1428      ASSESSMENT & PLAN:  Polycythemia vera Her CBC is showing mild improvement The patient stated she has been compliant taking medications as directed She would take 1000 mg on Mondays, Wednesdays and Fridays and to take 500 mg for the rest of the week She will continue to take Xarelto to prevent risk of blood clot.  Hypercalcemia She has intermittent hypercalcemia with history of hyperparathyroidism I recommend follow-up with endocrinology   No orders of the defined types were placed in this encounter.  All questions were answered. The patient knows to call the clinic with any problems, questions or concerns. No barriers to learning was detected. I spent 10 minutes counseling the patient face to face. The total time spent in the appointment was 15 minutes and more than 50%  was on counseling and review of test results     Heath Lark, MD 09/16/2016 2:14 PM

## 2016-09-16 NOTE — Telephone Encounter (Signed)
Left message on machine that Rx was written out yesterday and gave to [facility] and she was going to get security to bring to patient.

## 2016-09-16 NOTE — Telephone Encounter (Signed)
Patient called back and said that her feet has started swelling since Tuesday and has had a 2 lb weight gain.  No increased shortness of breath.  I spoke with Jettie Booze, NP and she advises patient to increase her lasix to 80 mg BID today and tomorrow then resume her previous dose on Saturday and call us back if she doesn't see any changes on Monday.   Patient is agreeable with plan and no further questions.

## 2016-09-16 NOTE — Assessment & Plan Note (Signed)
She has intermittent hypercalcemia with history of hyperparathyroidism I recommend follow-up with endocrinology

## 2016-09-16 NOTE — Assessment & Plan Note (Signed)
Her CBC is showing mild improvement The patient stated she has been compliant taking medications as directed She would take 1000 mg on Mondays, Wednesdays and Fridays and to take 500 mg for the rest of the week She will continue to take Xarelto to prevent risk of blood clot.

## 2016-09-16 NOTE — Telephone Encounter (Signed)
Patient called and left message on triage line stating her feet are swelling.  I tried calling her back but had to leave message asking for her to call us back.

## 2016-09-16 NOTE — Telephone Encounter (Signed)
Opened in error

## 2016-09-16 NOTE — Telephone Encounter (Signed)
Patient called and left message on Clinical Intake today stating that tomorrow is her last Fentanyl patch and she would like a Rx. Stated that she called on Tuesday and have not received a Rx yet.   Tried calling patient back regarding message and NA.

## 2016-09-17 ENCOUNTER — Ambulatory Visit (HOSPITAL_COMMUNITY)
Admission: RE | Admit: 2016-09-17 | Discharge: 2016-09-17 | Disposition: A | Discharge status: Home / Self Care | Payer: Medicare Other | Source: Ambulatory Visit | Attending: Internal Medicine | Admitting: Internal Medicine

## 2016-09-17 ENCOUNTER — Telehealth (HOSPITAL_COMMUNITY): Payer: Self-pay | Admitting: *Deleted

## 2016-09-17 DIAGNOSIS — R9431 Abnormal electrocardiogram [ECG] [EKG]: Secondary | ICD-10-CM | POA: Insufficient documentation

## 2016-09-17 DIAGNOSIS — I491 Atrial premature depolarization: Secondary | ICD-10-CM | POA: Diagnosis not present

## 2016-09-17 DIAGNOSIS — I509 Heart failure, unspecified: Secondary | ICD-10-CM | POA: Diagnosis present

## 2016-09-17 DIAGNOSIS — I493 Ventricular premature depolarization: Secondary | ICD-10-CM | POA: Insufficient documentation

## 2016-09-17 NOTE — Telephone Encounter (Signed)
Patient called back today and said that her feet were still swelling, they had gone down a little but not a significant amount.  I spoke with Jettie Booze, NP and she wants her to come in for an EKG to see if patient is back in Afib.  Appointment scheduled and patient is aware.

## 2016-09-20 ENCOUNTER — Telehealth: Payer: Self-pay | Admitting: *Deleted

## 2016-09-20 NOTE — Telephone Encounter (Signed)
Spoke with patient and she will " just let it go". She understood and didn't have any questions.

## 2016-09-20 NOTE — Telephone Encounter (Signed)
-----   Message from Gayland Curry, DO sent at 09/16/2016  4:48 PM EDT ----- Please let Mrs. Haen know that I don't think she needs to f/u with endocrine.  She already knows why she has elevated pth and calcium from her familial hypercalcemic hypocalciuria.   Tiffany L. Reed, D.O. [facility] [address], [place] [address] Cell Phone (Mon-Fri 8am-5pm):  [phone] On Call:  [phone] & follow prompts after 5pm & weekends Office Phone:  [phone] Office Fax:  [phone]  ----- Message ----- From: Heath Lark, MD Sent: 09/16/2016   2:15 PM To: Gayland Curry, DO

## 2016-09-27 ENCOUNTER — Other Ambulatory Visit: Payer: Medicare Other

## 2016-09-27 NOTE — Telephone Encounter (Signed)
Spoke with patient concerning date and time change for November appointment was 11/6, and now reschedule for 11/5 @2 :45pm

## 2016-09-29 ENCOUNTER — Ambulatory Visit (INDEPENDENT_AMBULATORY_CARE_PROVIDER_SITE_OTHER): Payer: Medicare Other | Admitting: Family Medicine

## 2016-09-29 ENCOUNTER — Encounter: Payer: Self-pay | Admitting: Family Medicine

## 2016-09-29 DIAGNOSIS — M25511 Pain in right shoulder: Secondary | ICD-10-CM

## 2016-09-29 NOTE — Patient Instructions (Signed)
Good to see you  Ice is your friend If not better I do think a nerve is being pinched and likely we need to get the mass removed.

## 2016-09-29 NOTE — Assessment & Plan Note (Signed)
Given injections today and tolerated the procedure well. We discussed icing regimen and home exercises. We discussed objective is a doing which ones to avoid. Patient with continue to be active otherwise. If this pain corresponds to the presence of the mass on the side of the neck I do feel that this could be compressing the nerve. Patient is following up with ENT with the possibility of removal.

## 2016-09-29 NOTE — Progress Notes (Signed)
[facility] [address] [place], [place] [address] Phone: [phone] Subjective:    I'm seeing this patient by the request  of:    CC: Right shoulder pain  WIO:[id number]  Sarah Mays is a 83 y.o. female coming in with complaint of right shoulder pain. Patient was found to have more of a bursitis of the shoulder and the rotator cuff degenerative changes. Patient was given an injection 3 weeks ago. Patient states minimal improvement. States that for approximately 3 days seems to be making some improvement. More pain seems to be localized around the neck and the posterior aspect of the shoulder. More tightness. Was finding that she did have a trapezius muscle spasm at last exam.     Past Medical History:  Diagnosis Date  . Abnormal stress test    a. 11/2011 Ex MV: EF 80%, small, partially reversible anteroapical defect->mild ischemia vs attenuation-->med Rx.  [name] Kitchen Alopecia, unspecified   . Chronic diastolic CHF (congestive heart failure) ([facility])    a. 11/2013 Echo: EF 60-65%, no rwma, mild TR, PASP 30mmHg.  . Closed fracture of lower end of radius with ulna   . Deafness    Left, s/p multiple surgeries  . Essential hypertension   . Gouty arthropathy, unspecified   . Hypothyroidism   . Insomnia, unspecified   . Macular degeneration    Left, s/p inj. tx  . Mitral valve disorders(424.0)   . Neck mass    right, w/u including MRI negative  . PAF (paroxysmal atrial fibrillation) (HCC)    a. on amio (02/2012 mild obstruction on PFT's) and xarelto.  . Polycythemia vera(238.4)   . Pure hypercholesterolemia   . Senile osteoporosis    Reclast in the past  . Tricuspid valve disorders, specified as nonrheumatic    Past Surgical History:  Procedure Laterality Date  . ABLATION  08/28/14   Back   . BREAST BIOPSY  06/18/1998   left  . BREAST BIOPSY    . ELECTROPHYSIOLOGIC STUDY N/A 01/08/2016   Procedure: Atrial Fibrillation Ablation;  Surgeon: Thompson Grayer, MD;  Location: [facility];  Service: Cardiovascular;  Laterality: N/A;  . FEMUR IM NAIL Right 10/08/2015   Procedure: INTRAMEDULLARY (IM) NAIL FEMORAL RIGHT;  Surgeon: Paralee Cancel, MD;  Location: [facility];  Service: Orthopedics;  Laterality: Right;  . TONSILLECTOMY    . TOTAL ABDOMINAL HYSTERECTOMY    . TYMPANOPLASTY Bilateral    Social History   Social History  . Marital status: Married    Spouse name: Sarah Mays  . Number of children: 2  . Years of education: 57   Social History Main Topics  . Smoking status: Former Smoker    Types: Cigarettes    Quit date: 01/19/1984  . Smokeless tobacco: Never Used  . Alcohol use 0.6 oz/week    1 Glasses of wine per week     Comment: 2 per day/ 14 a week  . Drug use: No  . Sexual activity: Not Currently   Other Topics Concern  . 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 45 grandkid-24 years old in 2016. College graduate Sarah Mays. Of [place]).    Lives in apartment,  Independent Living  section at [facility] since 02/2013.      Stay at home mother.       No Smoking history, Mod. alcohol use.   Patient has a living will, POA  Hobbies: CPU and painting            Allergies  Allergen Reactions  . Amlodipine Swelling and Other (See Comments)    Reaction:  Lower extremity swelling    Family History  Problem Relation Age of Onset  . Hypertension Father   . Heart disease Father        patient does not know details.   . Ovarian cancer Mother 63  . Cancer Sister        colon  . Heart disease Sister   . Cancer Sister   . Cancer Sister        hodgkin disease  . Breast cancer Daughter   . Cancer Daughter        breast     Past medical history, social, surgical and family history all reviewed in electronic medical record.  No pertanent information unless stated regarding to the chief complaint.   Review of Systems: No headache, visual changes, nausea, vomiting, diarrhea,  constipation, dizziness, abdominal pain, skin rash, fevers, chills, night sweats, weight loss, swollen lymph nodes, body aches, joint swelling, muscle aches, chest pain, shortness of breath, mood changes.  Positive muscle aches  Objective  Blood pressure 122/72, pulse 89, height 5' (1.524 m), weight 113 lb (51.3 kg), SpO2 95 %. Systems examined below as of 09/29/16   General: No apparent distress alert and oriented x3 mood and affect normal, dressed appropriately.  HEENT: Pupils equal, extraocular movements intact  Respiratory: Patient's speak in full sentences and does not appear short of breath Patient does have a large neck mass noted that has increased in size from previous exam Cardiovascular: No lower extremity edema, non tender, no erythema  Skin: Warm dry intact with no signs of infection or rash on extremities or on axial skeleton.  Abdomen: Soft nontender  Neuro: Cranial nerves II through XII are intact, neurovascularly intact in all extremities with 2+ DTRs and 2+ pulses.  .  Gait normal but does walk with the aid of a walker MSK:  Mild tender with full range of motion and good stability and symmetric strength and tone of  elbows, wrist, hip, knee and ankles bilaterally. Arthritic changes of multiple joints Shoulder: Inspection reveals no abnormalities, atrophy or asymmetry. Palpation is normal with no tenderness over AC joint or bicipital groove. ROM is full in all planes. Rotator cuff strength normal throughout. Mild impingement Speeds and Yergason's tests normal. No labral pathology noted with negative Obrien's, negative clunk and good stability. Normal scapular function observed. No painful arc and no drop arm sign. No apprehension sign Increased tenderness and stiffness of the right trapezius  Procedure note After verbal consent patient was prepped with call swabs and with a 25-gauge half-inch and was injected with a total of 4 mL of 0.5% Marcaine and 1 mL of Kenalog 40  mg/dL in 3 distinct trigger points. No blood loss. Post injection instructions given.      Impression and Recommendations:     This case required medical decision making of moderate complexity.      Note: This dictation was prepared with Dragon dictation along with smaller phrase technology. Any transcriptional errors that result from this process are unintentional.

## 2016-09-30 ENCOUNTER — Other Ambulatory Visit: Payer: Self-pay | Admitting: Internal Medicine

## 2016-09-30 ENCOUNTER — Telehealth (HOSPITAL_COMMUNITY): Payer: Self-pay | Admitting: Cardiology

## 2016-09-30 NOTE — Telephone Encounter (Signed)
Patient returned call and is aware to increase x 2 days and only take day 3 IF NEEDED. Also advised she should wear compression hose more frequently.

## 2016-09-30 NOTE — Telephone Encounter (Signed)
Patient called with concerns regarding LE edema Reports edema is up to her calves. Weight today 113 Denies SOB or chest pains  Reports she was told to increase her lasix to 80 BID x 3 days in the past and this help a lot.  Advised it is ok to repeat this and return call on 8/27 if things don't get better. Also advised to keep legs elevated as much as possible through out the day.   Patient voiced understanding   Will forward to provider to evaluate urgency/neccesity  of EKG?

## 2016-09-30 NOTE — Telephone Encounter (Signed)
OK to repeat, but do 2 days and then only the third day if needed.    Should try compression hose if she has any. Please sent script if she does not.     Legrand Como [address]" [place], PA-C 09/30/2016 11:47 AM

## 2016-10-01 NOTE — Telephone Encounter (Signed)
rx called into pharmacy

## 2016-10-04 ENCOUNTER — Encounter: Payer: Self-pay | Admitting: Internal Medicine

## 2016-10-05 NOTE — Telephone Encounter (Signed)
Patient called back today to let us know that her swelling in her LE edema had gone down. She said she would continue wearing her compression hose.  No further questions.

## 2016-10-14 ENCOUNTER — Telehealth: Payer: Self-pay | Admitting: *Deleted

## 2016-10-14 NOTE — Telephone Encounter (Signed)
Patient called and stated that she wants to pick up her Rx next Wednesday at [facility]. Stated that her last patch she will put on Sunday. Would like a call once ready [phone]

## 2016-10-19 DIAGNOSIS — R221 Localized swelling, mass and lump, neck: Secondary | ICD-10-CM | POA: Diagnosis not present

## 2016-10-19 MED ORDER — FENTANYL 25 MCG/HR TD PT72: 25.0000 ug | MEDICATED_PATCH | TRANSDERMAL | 0 refills | Status: DC

## 2016-10-19 NOTE — Telephone Encounter (Signed)
Spoke with patient and advised rx ready for pick-up at [facility], will be at the front desk.

## 2016-10-20 ENCOUNTER — Other Ambulatory Visit: Payer: Self-pay | Admitting: Otolaryngology

## 2016-10-20 DIAGNOSIS — R221 Localized swelling, mass and lump, neck: Secondary | ICD-10-CM

## 2016-10-26 ENCOUNTER — Ambulatory Visit
Admission: RE | Admit: 2016-10-26 | Discharge: 2016-10-26 | Disposition: A | Discharge status: Home / Self Care | Payer: Medicare Other | Source: Ambulatory Visit | Attending: Otolaryngology | Admitting: Otolaryngology

## 2016-10-26 DIAGNOSIS — R221 Localized swelling, mass and lump, neck: Secondary | ICD-10-CM

## 2016-10-26 MED ORDER — IOPAMIDOL (ISOVUE-300) INJECTION 61%
75.0000 mL | Freq: Once | INTRAVENOUS | Status: AC | PRN
Start: 2016-10-26 — End: 2016-10-26
  Administered 2016-10-26: 75 mL via INTRAVENOUS

## 2016-11-01 DIAGNOSIS — R221 Localized swelling, mass and lump, neck: Secondary | ICD-10-CM | POA: Diagnosis not present

## 2016-11-02 ENCOUNTER — Other Ambulatory Visit: Payer: Self-pay | Admitting: Otolaryngology

## 2016-11-02 ENCOUNTER — Telehealth (HOSPITAL_COMMUNITY): Payer: Self-pay | Admitting: *Deleted

## 2016-11-02 DIAGNOSIS — H353112 Nonexudative age-related macular degeneration, right eye, intermediate dry stage: Secondary | ICD-10-CM | POA: Diagnosis not present

## 2016-11-02 DIAGNOSIS — H353212 Exudative age-related macular degeneration, right eye, with inactive choroidal neovascularization: Secondary | ICD-10-CM | POA: Diagnosis not present

## 2016-11-02 DIAGNOSIS — H353124 Nonexudative age-related macular degeneration, left eye, advanced atrophic with subfoveal involvement: Secondary | ICD-10-CM | POA: Diagnosis not present

## 2016-11-02 DIAGNOSIS — H353222 Exudative age-related macular degeneration, left eye, with inactive choroidal neovascularization: Secondary | ICD-10-CM | POA: Diagnosis not present

## 2016-11-02 NOTE — Telephone Encounter (Signed)
Request for surgical clearance for radiofrequency ablation.  Dr. Aundra Dubin says its ok to hold xarelto 2 days prior.  Clearance faxed today to [phone]

## 2016-11-04 ENCOUNTER — Other Ambulatory Visit: Payer: Self-pay | Admitting: Internal Medicine

## 2016-11-09 ENCOUNTER — Telehealth (HOSPITAL_COMMUNITY): Payer: Self-pay | Admitting: *Deleted

## 2016-11-09 NOTE — Telephone Encounter (Signed)
Received surgical clearance request from [facility] for patient to have excision right lateral neck mass.   Dr. Aundra Dubin advises patient to hold xarelto 3 days prior to surgery.  Clearance faxed today to [phone]

## 2016-11-10 ENCOUNTER — Encounter: Payer: Self-pay | Admitting: Internal Medicine

## 2016-11-10 ENCOUNTER — Telehealth (HOSPITAL_COMMUNITY): Payer: Self-pay | Admitting: *Deleted

## 2016-11-10 ENCOUNTER — Non-Acute Institutional Stay: Payer: Medicare Other | Admitting: Internal Medicine

## 2016-11-10 VITALS — BP 148/80 | HR 74 | Temp 98.6°F | Wt 113.0 lb

## 2016-11-10 DIAGNOSIS — I5032 Chronic diastolic (congestive) heart failure: Secondary | ICD-10-CM

## 2016-11-10 DIAGNOSIS — R1011 Right upper quadrant pain: Secondary | ICD-10-CM

## 2016-11-10 DIAGNOSIS — I480 Paroxysmal atrial fibrillation: Secondary | ICD-10-CM

## 2016-11-10 DIAGNOSIS — M7989 Other specified soft tissue disorders: Secondary | ICD-10-CM

## 2016-11-10 DIAGNOSIS — J0111 Acute recurrent frontal sinusitis: Secondary | ICD-10-CM | POA: Diagnosis not present

## 2016-11-10 MED ORDER — AMOXICILLIN-POT CLAVULANATE 875-125 MG PO TABS: 1.0000 | ORAL_TABLET | Freq: Two times a day (BID) | ORAL | 0 refills | Status: DC

## 2016-11-10 MED ORDER — SACCHAROMYCES BOULARDII 250 MG PO CAPS: 250.0000 mg | ORAL_CAPSULE | Freq: Two times a day (BID) | ORAL | 0 refills | Status: DC

## 2016-11-10 NOTE — Progress Notes (Signed)
Location:  [place] of Service:  Clinic (12)  Provider: Lillie Bollig L. Mariea Clonts, D.O., C.M.D.  Code Status: DNR Goals of Care:  Advanced Directives 07/21/2016  Does Patient Have a Medical Advance Directive? Yes  Type of Paramedic of [place];Living will  Does patient want to make changes to medical advance directive? -  Copy of [place] in Chart? -  Would patient like information on creating a medical advance directive? -  Pre-existing out of facility DNR order (yellow form or pink MOST form) -   Chief Complaint  Patient presents with  . Acute Visit    leg swelling    HPI: Patient is a 83 y.o. female seen today for an acute visit for abdominal pain yesterday am (right upper quadrant and lower abdomen that only lasted about 30 mins first thing yesterday am) which then resolved, then noted to have increased peripheral edema, headache, congestion and coughed up yellow sputum yesterday which did relieve her head pressure.  Listening to her today, she was tachycardic and irregular (has known afib).  She also was terribly dizzy yesterday, but is not this am.   EKG was done here.  She had been advised by the clinic nurse yesterday to see Dr. Aundra Dubin, but they called his office late yesterday afternoon.  Has chronic dyspnea on exertion, but reports no change.  Weight today:  113 lbs which is stable from September 29, 2016.  Past Medical History:  Diagnosis Date  . Abnormal stress test    a. 11/2011 Ex MV: EF 80%, small, partially reversible anteroapical defect->mild ischemia vs attenuation-->med Rx.  [name] Kitchen Alopecia, unspecified   . Chronic diastolic CHF (congestive heart failure) ([facility])    a. 11/2013 Echo: EF 60-65%, no rwma, mild TR, PASP 30mmHg.  . Closed fracture of lower end of radius with ulna   . Deafness    Left, s/p multiple surgeries  . Essential hypertension   . Gouty arthropathy, unspecified   . Hypothyroidism   . Insomnia,  unspecified   . Macular degeneration    Left, s/p inj. tx  . Mitral valve disorders(424.0)   . Neck mass    right, w/u including MRI negative  . PAF (paroxysmal atrial fibrillation) (HCC)    a. on amio (02/2012 mild obstruction on PFT's) and xarelto.  . Polycythemia vera(238.4)   . Pure hypercholesterolemia   . Senile osteoporosis    Reclast in the past  . Tricuspid valve disorders, specified as nonrheumatic     Past Surgical History:  Procedure Laterality Date  . ABLATION  08/28/14   Back   . BREAST BIOPSY  06/18/1998   left  . BREAST BIOPSY    . ELECTROPHYSIOLOGIC STUDY N/A 01/08/2016   Procedure: Atrial Fibrillation Ablation;  Surgeon: Thompson Grayer, MD;  Location: [facility];  Service: Cardiovascular;  Laterality: N/A;  . FEMUR IM NAIL Right 10/08/2015   Procedure: INTRAMEDULLARY (IM) NAIL FEMORAL RIGHT;  Surgeon: Paralee Cancel, MD;  [facility];  Service: Orthopedics;  Laterality: Right;  . TONSILLECTOMY    . TOTAL ABDOMINAL HYSTERECTOMY    . TYMPANOPLASTY Bilateral     Allergies  Allergen Reactions  . Amlodipine Swelling and Other (See Comments)    Reaction:  Lower extremity swelling     Outpatient Encounter Prescriptions as of 11/10/2016  Medication Sig  . allopurinol (ZYLOPRIM) 100 MG tablet Take 1 tablet (100 mg total) by mouth daily.  . cholecalciferol (VITAMIN D) 1000 UNITS  tablet Take 2,000 Units by mouth daily.   . fentaNYL (DURAGESIC - DOSED MCG/HR) 25 MCG/HR patch Place 1 patch (25 mcg total) onto the skin every 3 (three) days.  . fluticasone (FLONASE) 50 MCG/ACT nasal spray Place 2 sprays into both nostrils 2 (two) times daily.  . furosemide (LASIX) 40 MG tablet Take 80 mg by mouth every morning  . gabapentin (NEURONTIN) 100 MG capsule Take 300 mg by mouth at bedtime.   . hydrALAZINE (APRESOLINE) 50 MG tablet Take 1.5 tablets (75 mg total) by mouth 3 (three) times daily.  . hydroxyurea (HYDREA) 500 MG capsule May take with food to minimize GI side  effects. Take 2 capsules by mouth on Mon, Wed, and Fri and take 1 capsule on all other days  . levothyroxine (SYNTHROID, LEVOTHROID) 112 MCG tablet TAKE 1 TABLET EACH DAY.  . Magnesium 250 MG TABS Take 250 mg by mouth daily.   . Melatonin 10 MG TABS Take 10 mg by mouth at bedtime.  . Multiple Vitamins-Minerals (PRESERVISION AREDS 2) CAPS Take 2 capsules by mouth daily.   . polycarbophil (FIBERCON) 625 MG tablet Take 2 tablets (1,250 mg total) by mouth 2 (two) times daily.  . potassium chloride (K-DUR) 10 MEQ tablet TAKE 1 TABLET TWICE DAILY.  . traMADol (ULTRAM) 50 MG tablet TAKE ONE TABLET AT BEDTIME.  [name] Kitchen XARELTO 15 MG TABS tablet TAKE 1 TABLET ONCE DAILY.   No facility-administered encounter medications on file as of 11/10/2016.     Review of Systems:  Review of Systems  Constitutional: Positive for malaise/fatigue. Negative for chills and fever.  HENT: Positive for congestion and sinus pain.   Eyes: Negative for blurred vision.  Respiratory: Positive for cough, sputum production and shortness of breath.   Cardiovascular: Positive for leg swelling. Negative for chest pain and palpitations.  Gastrointestinal: Positive for abdominal pain.  Genitourinary: Negative for dysuria.  Musculoskeletal: Positive for back pain. Negative for falls.  Skin: Negative for itching and rash.  Neurological: Positive for dizziness and headaches. Negative for weakness.    Health Maintenance  Topic Date Due  . TETANUS/TDAP  08/03/1949  . INFLUENZA VACCINE  09/08/2016  . DEXA SCAN  Completed  . PNA vac Low Risk Adult  Completed    Physical Exam: Vitals:   11/10/16 0944  BP: (!) 148/80  Pulse: 74  Temp: 98.6 F (37 C)  TempSrc: Oral  SpO2: 96%  Weight: 113 lb (51.3 kg)   Body mass index is 22.07 kg/m. Physical Exam  Constitutional: She is oriented to person, place, and time. She appears well-developed and well-nourished. No distress.  HENT:  HOH  Eyes:  glasses  Cardiovascular: Intact  distal pulses.   irreg irreg, bilateral ankle edema wearing compression hose  Pulmonary/Chest: Effort normal and breath sounds normal. She has no rales.  Abdominal: Bowel sounds are normal. She exhibits no distension. There is no tenderness.  Musculoskeletal:  Walks with lightweight walker  Neurological: She is alert and oriented to person, place, and time.  Skin: Skin is warm and dry.  Psychiatric: She has a normal mood and affect.    Labs reviewed: Basic Metabolic Panel:  Recent Labs  02/05/16 1427 02/18/16 1514 05/24/16 1527 09/14/16 1428  NA 137 137 141 136  K 3.7 3.8 4.6 4.0  CL 98* 96* 99  --   CO2 29 31 28  30*  GLUCOSE 91 94 82 112  BUN 17 16 22  14.7  CREATININE 0.76 0.86 0.91 0.8  CALCIUM 10.1 10.7*  10.7* 11.0*  TSH  --  1.232  --   --    Liver Function Tests:  Recent Labs  02/18/16 1514 09/14/16 1428  AST 18 17  ALT 14 18  ALKPHOS 49 62  BILITOT 0.9 1.00  PROT 6.7 7.0  ALBUMIN 4.7 4.6   No results for input(s): LIPASE, AMYLASE in the last 8760 hours. No results for input(s): AMMONIA in the last 8760 hours. CBC:  Recent Labs  05/24/16 1527 07/27/16 1441 09/14/16 1428  WBC 11.3* 10.1 10.6*  NEUTROABS 8.7* 7.7* 7.7*  HGB 14.1 14.5 14.3  HCT 41.6 43.0 42.3  MCV 113* 115.6* 114.9*  PLT 581* 567* 501*   Lipid Panel: No results for input(s): CHOL, HDL, LDLCALC, TRIG, CHOLHDL, LDLDIRECT in the last 8760 hours. No results found for: HGBA1C  Procedures since last visit: Ct Soft Tissue Neck W Contrast  Result Date: 10/26/2016 CLINICAL DATA:  83 year old female with right side posterior neck mass, chronic but reportedly enlarging. Pain radiating to the right shoulder. EXAM: CT NECK WITH CONTRAST TECHNIQUE: Multidetector CT imaging of the neck was performed using the standard protocol following the bolus administration of intravenous contrast. CONTRAST:  76mL ISOVUE-300 IOPAMIDOL (ISOVUE-300) INJECTION 61% COMPARISON:  Cervical spine radiographs  05/05/2016. Head CT 06/16/2015. FINDINGS: Pharynx and larynx: Laryngeal and pharyngeal soft tissue contours are normal. Negative parapharyngeal spaces, aside from tortuosity of the ICA on the right. Negative retropharyngeal spaces aside from a partially retropharyngeal course of the left ICA. Salivary glands: Negative sublingual space. Bilateral submandibular and parotid glands are within normal limits. Thyroid: The thyroid parenchyma appears normal. However, in the right tracheoesophageal groove posterior to the right thyroid there is an oval 10 x 25 mm enhancing soft tissue nodule with a superior pole are vessel (series 6, image 50). No regional mass effect. Lymph nodes: Palpable abnormality marked along the right lateral neck on series 2, image 41. Subjacent and slightly posterior to the skin marker there is a round soft tissue mass which is expanding the posterior aspect of the right sternocleidomastoid muscle. The mass is mostly fat density, but does have a mild to moderate level of heterogeneity and occasional curvilinear vessels coursing through it. The lesion is 31 x 28 x 48 mm (AP by transverse by CC). No regional inflammation. No regional lymphadenopathy. Bilateral cervical lymph nodes are within normal limits. Vascular: Major vascular structures in the neck and at the skullbase are patent. Mild for age carotid bifurcation atherosclerosis. Limited intracranial: Negative. Visualized orbits: Stable and negative. Mastoids and visualized paranasal sinuses: Visualized paranasal sinuses are stable and well pneumatized. Chronic inferior right mastoid opacification appears stable. Skeleton: Moderate to severe cervical spine degeneration, including bulky ligamentous hypertrophy with dystrophic calcifications about the odontoid resulting in spinal stenosis at the cervicomedullary junction level (sagittal image 56). Osteopenia. Partially visible upper thoracic scoliosis. Chronic posterior right rib fractures. No  acute or suspicious osseous lesion identified. Upper chest: Mild apical lung scarring. Negative visualized superior mediastinum. IMPRESSION: 1. Palpable abnormality is a 4.8 cm heterogeneous fat density mass expanding the posterior margin of the right sternocleidomastoid muscle. This could be either a low grade liposarcoma or a benign lipoma. There is no regional lymphadenopathy. 2. A 2.5 cm probable parathyroid adenoma is incidentally discovered at the right tracheoesophageal groove posterior to the thyroid. Query hyperparathyroidism. 3. Advanced cervical spine degeneration. Degenerative stenosis at the cervicomedullary junction. Electronically Signed   By: Genevie Ann M.D.   On: 10/26/2016 13:24    Assessment/Plan 1. Leg swelling -  oddly weight stable and EKG unchanged, will send note and EKG to Dr. Harold Hedge also agreed she should not have her back ablation procedure when she is feeling so bad  - EKG 12-Lead with NSR, stable from 09/06/16  2. Right upper quadrant abdominal pain -has h/o cholelithiasis, resolved abdominal pain   3. Paroxysmal atrial fibrillation (HCC) -seemed to be in afib during exam, but EKG appears like those from the past  4. Chronic diastolic CHF (congestive heart failure) (HCC) -has edema suggestive of acute exacerbation, but no increase in dyspnea or change in weight, stable EKG  5. Acute recurrent frontal sinusitis - will treat due to significant dizziness yesterday and concern that she will have a fall if we do not get this taken care of quickly - amoxicillin-clavulanate (AUGMENTIN) 875-125 MG tablet; Take 1 tablet by mouth 2 (two) times daily.  Dispense: 20 tablet; Refill: 0 - saccharomyces boulardii (FLORASTOR) 250 MG capsule; Take 1 capsule (250 mg total) by mouth 2 (two) times daily.  Dispense: 20 capsule; Refill: 0  Labs/tests ordered:   Orders Placed This Encounter  Procedures  . EKG 12-Lead  see cardiology  Next appt:  01/26/2017 regular appt  Daysy Santini L.  Elenna Spratling, D.O. [facility] [address], [place] [address] Cell Phone (Mon-Fri 8am-5pm):  [phone] On Call:  [phone] & follow prompts after 5pm & weekends Office Phone:  [phone] Office Fax:  [phone]

## 2016-11-10 NOTE — Telephone Encounter (Signed)
Advanced Heart Failure Triage Encounter  Patient Name: Sarah Mays  Date of Call: 11/10/16  Problem:   Patient called and left message on triage line stating her BP sitting is 148/90 and standing is 150/90.  She started feeling dizzy all yesterday and had a headache. I tried calling her back but had to leave VM asking for her to return our call.   Plan:  Will wait for patient to call me back to get more information.   Darron Doom, RN

## 2016-11-10 NOTE — Telephone Encounter (Signed)
She can take an extra 40 mg Lasix in the afternoon along with her 80 mg in the am for 2 days.

## 2016-11-10 NOTE — Telephone Encounter (Signed)
Patient called back and said she had gone to see her PCP this morning and she is having sinus problems which may be causing her dizziness.  She did say that she is having a problem with her feet swelling since yesterday.  She tries to keep them elevated and she is wearing her compression hose.  Weight is stable.  Will send to Dr. Aundra Dubin to review and see if he wants patient to increase her lasix or not.

## 2016-11-10 NOTE — Telephone Encounter (Signed)
Called and patient is agreeable with plan.  If the swelling doesn't improve  she will call us back.

## 2016-11-12 ENCOUNTER — Telehealth (HOSPITAL_COMMUNITY): Payer: Self-pay

## 2016-11-12 NOTE — Telephone Encounter (Signed)
Pt. Has been made an appt to bee seen in APP on day that Sarah Mays is in clinic on 10/12 at 1500.

## 2016-11-12 NOTE — Progress Notes (Signed)
Set 10/12 at 3 pm

## 2016-11-15 ENCOUNTER — Other Ambulatory Visit: Payer: Self-pay | Admitting: Internal Medicine

## 2016-11-15 NOTE — Telephone Encounter (Signed)
Patient called and stated that she needs a refill on her Potassium. Stated that the Cardiologist changed her to One tablet once daily  Is this ok to changed the directions and fax to her pharmacy for a refill. Please Advise.

## 2016-11-15 NOTE — Telephone Encounter (Signed)
Patient also called back and requested a Rx for her Fentanyl Patch-wants to pick up at [facility] this week. LRF 10/19/16

## 2016-11-16 ENCOUNTER — Other Ambulatory Visit: Payer: Self-pay | Admitting: *Deleted

## 2016-11-16 MED ORDER — FENTANYL 25 MCG/HR TD PT72: 25.0000 ug | MEDICATED_PATCH | TRANSDERMAL | 0 refills | Status: DC

## 2016-11-16 NOTE — Telephone Encounter (Signed)
rx handwritten out at [facility] and gave to patient in person.

## 2016-11-19 ENCOUNTER — Telehealth (HOSPITAL_COMMUNITY): Payer: Self-pay

## 2016-11-19 ENCOUNTER — Ambulatory Visit (HOSPITAL_COMMUNITY)
Admission: RE | Admit: 2016-11-19 | Discharge: 2016-11-19 | Disposition: A | Discharge status: Home / Self Care | Payer: Medicare Other | Source: Ambulatory Visit | Attending: Cardiology | Admitting: Cardiology

## 2016-11-19 VITALS — BP 119/68 | HR 68 | Wt 112.0 lb

## 2016-11-19 DIAGNOSIS — I480 Paroxysmal atrial fibrillation: Secondary | ICD-10-CM | POA: Diagnosis not present

## 2016-11-19 DIAGNOSIS — I5032 Chronic diastolic (congestive) heart failure: Secondary | ICD-10-CM | POA: Insufficient documentation

## 2016-11-19 DIAGNOSIS — Z90710 Acquired absence of both cervix and uterus: Secondary | ICD-10-CM | POA: Diagnosis not present

## 2016-11-19 DIAGNOSIS — I110 Hypertensive heart disease with heart failure: Secondary | ICD-10-CM | POA: Diagnosis not present

## 2016-11-19 DIAGNOSIS — H3530 Unspecified macular degeneration: Secondary | ICD-10-CM | POA: Insufficient documentation

## 2016-11-19 DIAGNOSIS — R001 Bradycardia, unspecified: Secondary | ICD-10-CM | POA: Diagnosis not present

## 2016-11-19 DIAGNOSIS — I4891 Unspecified atrial fibrillation: Secondary | ICD-10-CM | POA: Insufficient documentation

## 2016-11-19 DIAGNOSIS — I2510 Atherosclerotic heart disease of native coronary artery without angina pectoris: Secondary | ICD-10-CM | POA: Diagnosis not present

## 2016-11-19 DIAGNOSIS — Z7901 Long term (current) use of anticoagulants: Secondary | ICD-10-CM | POA: Diagnosis not present

## 2016-11-19 DIAGNOSIS — I10 Essential (primary) hypertension: Secondary | ICD-10-CM

## 2016-11-19 DIAGNOSIS — E039 Hypothyroidism, unspecified: Secondary | ICD-10-CM | POA: Diagnosis not present

## 2016-11-19 DIAGNOSIS — I495 Sick sinus syndrome: Secondary | ICD-10-CM | POA: Diagnosis not present

## 2016-11-19 DIAGNOSIS — Z87891 Personal history of nicotine dependence: Secondary | ICD-10-CM | POA: Insufficient documentation

## 2016-11-19 DIAGNOSIS — Z8249 Family history of ischemic heart disease and other diseases of the circulatory system: Secondary | ICD-10-CM | POA: Insufficient documentation

## 2016-11-19 NOTE — Patient Instructions (Signed)
No changes today.  Follow up as scheduled with Dr. Aundra Dubin.  Take all medication as prescribed the day of your appointment. Bring all medications with you to your appointment.  Do the following things EVERYDAY: 1) Weigh yourself in the morning before breakfast. Write it down and keep it in a log. 2) Take your medicines as prescribed 3) Eat low salt foods-Limit salt (sodium) to 2000 mg per day.  4) Stay as active as you can everyday 5) Limit all fluids for the day to less than 2 liters

## 2016-11-19 NOTE — Telephone Encounter (Signed)
CHF Clinic appointment reminder call placed to patient for upcoming post-hospital follow up.  Does understand purpose of this appointment and where CHF Clinic is located? Yes  How is patient feeling? Well, no complaints  Does patient have all of their medications since their recent discharge? Yes  Patient also reminded to take all medications as prescribed on the day of his/her appointment and to bring all medications to this appointment.  Advised to call our office for tardiness or cancellations/rescheduling needs.  .Bradley, Megan Genevea  

## 2016-11-19 NOTE — Progress Notes (Signed)
Patient ID: Sarah Mays, female   DOB: 1930-09-09, 83 y.o.   MRN: [id number] PCP: Dr Hollace Kinnier Cardiology: Dr. Aundra Dubin  83 yo with paroxysmal atrial fibrillation and chronic diastolic CHF returns for cardiology evaluation.  In 7/16, she was admitted with chest pain.  Cardiolite showed no ischemia or infarction.  Echo in 6/17 showed EF 60-65%.  She developed problems elevated HR when in atrial fibrillation but bradycardia when in NSR.  She underwent atrial fibrillation ablation in 11/17.  She is tolerating Xarelto without melena or BRBPR.  Dr. Rayann Heman had her stop amiodarone.    Returns today for HF follow up. Says she feels great. Denies SOB. Notes that a few weeks ago she had intense stomach pain with nausea. Other than that no problems. Saw Dr. Mariea Clonts last week, also called our office and follow up appointment was reccommeded. She plans to have lipoma on her right neck removed next week. Denies chest pain, palpitations and orthopnea.   ECG (personally reviewed): NSR, nonspecific inferior T wave inversions, mildly prolonged QTc 483 msec.    Labs (7/13): K 4.7, creatinine 0.56 Labs (8/13): K 5.4, creatinine 0.69 Labs (11/13): TSH 6.45, K 5.4=>3.8, creatinine 1.5=>1.2, BUN 70=>35, HCT 33.2, BNP 866, AST 25, ALT 36 Labs (12/13): K 3.5 => 2.8, creatinine 1.2 => 1.33, BUN 56 Labs (1/14): LDL 85, HDL 86, K 3.9, creatinine 1.0, BNP 447 Labs (4/14): K 4.1, creatinine 0.9, BNP 293, LFTs normal, TSH normal Labs (8/14): K 4.3, creatinine 1.2, LFTs normal, TSH normal Labs (12/14): LFTs normal, TSH normal Labs (3/15): K 4.5, creatinine 1.3, HCT 36.2 Labs (10/15): K 5.1, creatinine 0.9, LFTs normal, TSH normal, pBNP 1893 Labs (3/16): K 3.9, creatinine 1.03, HCT 36.9, LFTs normal Labs (7/16): K 4, creatinine 0.94, calcium 10.8, LFTs normal Labs (9/16): K 4, creatinine 0.9, LDL 69, TSH normal, HCT 42.3 Labs (5/17): K 4.6, creatinine 0.94, HCT 41.5, LFTs normal. Labs (6/17): K 5, creatinine 1.04, LDL 42,  HDL 101, TSH normal Labs (12/17): K 3.7, creatinine 0.76, hgb 11.2 Labs (4/18): K 4.6, creatinine 0.91  PMH: 1. Atrial fibrillation: Diagnosed initially in 10/13.  Holter monitor in 11/13 showed atrial fibrillation with average rate 65.  - Atrial fibrillation ablation in 11/17.  2. Chronic diastolic CHF: Echo (52/84) with EF 65-70%, mild MR, moderate biatrial enlargement, moderate TR, PA systolic pressure 45 mmHg.   Echo (10/15): EF 60-65%.  Echo (7/16) with EF 60-65%, mild MR.  - Echo (6/17): EF 60-65%, PASP 41 mmHg.  3. ETT-Sestamibi (11/13): Exercised to stage II, small partially reversible anteroapical perfusion defect suggesting mild ischemia versus attenuation, EF 80%.  Cardiolite (7/16) with EF 64%, no ischemia or infarction.  4. HTN: Lower extremity swelling with amlodipine.  5. Polycythemia vera: Has had phlebotomy only once.  6. Hypothyroidism 7. Macular degeneration 8. TAH 1980 9. Familial hypocalciuric hypercalcemia 10. PFTs (amiodarone use) in 1/14 showed a mild obstructive defect.  11. Degenerative disc disease 12. Sick sinus syndrome: Has not required PPM.   SH: Prior smoker (years ago).  Married, lived in [place] but now at [place]  Occasional ETOH.  Daughter works for [employer].   FH: CAD  ROS: All systems reviewed and negative except as per HPI.   Current Outpatient Prescriptions  Medication Sig Dispense Refill  . allopurinol (ZYLOPRIM) 100 MG tablet Take 1 tablet (100 mg total) by mouth daily. 30 tablet 6  . amoxicillin-clavulanate (AUGMENTIN) 875-125 MG tablet Take 1 tablet by mouth 2 (two) times daily. 20 tablet  0  . cholecalciferol (VITAMIN D) 1000 UNITS tablet Take 2,000 Units by mouth daily.     . fentaNYL (DURAGESIC - DOSED MCG/HR) 25 MCG/HR patch Place 1 patch (25 mcg total) onto the skin every 3 (three) days. 10 patch 0  . fluticasone (FLONASE) 50 MCG/ACT nasal spray Place 2 sprays into both nostrils 2 (two) times daily as needed (sinus infection).      . furosemide (LASIX) 40 MG tablet Take 80 mg by mouth every morning 180 tablet 2  . gabapentin (NEURONTIN) 100 MG capsule Take 300 mg by mouth at bedtime.     . hydrALAZINE (APRESOLINE) 50 MG tablet Take 1.5 tablets (75 mg total) by mouth 3 (three) times daily. 135 tablet 3  . hydroxyurea (HYDREA) 500 MG capsule May take with food to minimize GI side effects. Take 2 capsules (1000 mg) by mouth on Mon, Wed, and Fri and take 1 capsule (500 mg) on all other days.    [name] Kitchen levothyroxine (SYNTHROID, LEVOTHROID) 112 MCG tablet TAKE 1 TABLET EACH DAY. (Patient taking differently: TAKE 1 TABLET (112 MCG) BY MOUTH EACH DAY.) 30 tablet 5  . Magnesium 250 MG TABS Take 250 mg by mouth daily.     . Melatonin 10 MG TABS Take 10 mg by mouth at bedtime.    . Multiple Vitamins-Minerals (PRESERVISION AREDS 2) CAPS Take 2 capsules by mouth daily.     . polycarbophil (FIBERCON) 625 MG tablet Take 2 tablets (1,250 mg total) by mouth 2 (two) times daily. 60 tablet 0  . potassium chloride (K-DUR) 10 MEQ tablet TAKE 1 TABLET TWICE DAILY. (Patient taking differently: TAKE (10 MEQ) TABLET BY MOUTH TWICE DAILY.) 60 tablet 0  . saccharomyces boulardii (FLORASTOR) 250 MG capsule Take 1 capsule (250 mg total) by mouth 2 (two) times daily. 20 capsule 0  . traMADol (ULTRAM) 50 MG tablet TAKE ONE TABLET AT BEDTIME. (Patient taking differently: TAKE 50 MG TABLET BY MOUTH AT BEDTIME.) 30 tablet 0  . XARELTO 15 MG TABS tablet TAKE 1 TABLET ONCE DAILY. 30 tablet 6   No current facility-administered medications for this encounter.     BP 119/68 (BP Location: Right Arm, Patient Position: Sitting, Cuff Size: Normal)   Pulse 68   Wt 112 lb (50.8 kg)   SpO2 98%   BMI 21.87 kg/m  General: Well appearing. No resp difficulty. HEENT: Normal Neck: Supple.Right neck lipoma.  JVP 5-6. Carotids 2+ bilat; no bruits. No thyromegaly or nodule noted. Cor: PMI nondisplaced. RRR. 1/6 SEM.  Lungs: CTAB, normal effort. Abdomen: Soft, non-tender,  non-distended, no HSM. No bruits or masses. +BS  Extremities: No cyanosis, clubbing, rash, R and LLE no edema.  Neuro: Alert & orientedx3, cranial nerves grossly intact. moves all 4 extremities w/o difficulty. Affect pleasant   Assessment/Plan:  1. Atrial fibrillation: First noted in 10/13.  Atrial fibrillation has triggered acute on chronic diastolic CHF in the past.  CHADSVASC score is 29 (age, female gender, HTN, CHF).  She is anticoagulated with Xarelto 15 mg daily which is appropriate for her creatinine clearance (reassessed today).  She had atrial fibrillation ablation in 11/17, seems to have mainly been in NSR since then.  She is now off amiodarone.   - No nodal blockers at this time given sick sinus syndrome.  - Continue Xarelto.   2. CAD: 7/16 Cardiolite showed no ischemia or infarction.  - Denies chest pain.  3. Chronic diastolic CHF:  NYHA class II symptoms currently. Volume status improved on current  Lasix dosing. - NYHA II - Volume stable. Continue Lasix 80 mg daily.  - Continue KCl 42mEq daily.  - patient knows to take an extra 40mg  of Lasix if her weight goes up by 3 pounds overnight.   4. HTN:  - Well controlled on current regimen.  - Continue hydralazine 75 mg TID.      5. Bradycardia: Suspect she has a degree of sick sinus syndrome.   - Stable.    6. Hypercalcemia: Mild.  Has diagnosis of familial hypocalciuric hypercalcemia.  - No change.   Spoke to Dr. Aundra Dubin, patient will keep her appt. With him in November.    Arbutus Leas 11/19/2016

## 2016-11-23 ENCOUNTER — Encounter (HOSPITAL_COMMUNITY): Payer: Self-pay

## 2016-11-23 ENCOUNTER — Encounter (HOSPITAL_COMMUNITY)
Admission: RE | Admit: 2016-11-23 | Discharge: 2016-11-23 | Disposition: A | Discharge status: Home / Self Care | Payer: Medicare Other | Source: Ambulatory Visit | Attending: Otolaryngology | Admitting: Otolaryngology

## 2016-11-23 DIAGNOSIS — M109 Gout, unspecified: Secondary | ICD-10-CM | POA: Diagnosis not present

## 2016-11-23 DIAGNOSIS — D170 Benign lipomatous neoplasm of skin and subcutaneous tissue of head, face and neck: Secondary | ICD-10-CM | POA: Diagnosis not present

## 2016-11-23 DIAGNOSIS — I5032 Chronic diastolic (congestive) heart failure: Secondary | ICD-10-CM | POA: Diagnosis not present

## 2016-11-23 DIAGNOSIS — I2510 Atherosclerotic heart disease of native coronary artery without angina pectoris: Secondary | ICD-10-CM | POA: Diagnosis not present

## 2016-11-23 DIAGNOSIS — E039 Hypothyroidism, unspecified: Secondary | ICD-10-CM | POA: Diagnosis not present

## 2016-11-23 DIAGNOSIS — I110 Hypertensive heart disease with heart failure: Secondary | ICD-10-CM | POA: Diagnosis not present

## 2016-11-23 DIAGNOSIS — Z7901 Long term (current) use of anticoagulants: Secondary | ICD-10-CM | POA: Diagnosis not present

## 2016-11-23 DIAGNOSIS — R221 Localized swelling, mass and lump, neck: Secondary | ICD-10-CM | POA: Diagnosis present

## 2016-11-23 DIAGNOSIS — Z79899 Other long term (current) drug therapy: Secondary | ICD-10-CM | POA: Diagnosis not present

## 2016-11-23 DIAGNOSIS — Z87891 Personal history of nicotine dependence: Secondary | ICD-10-CM | POA: Diagnosis not present

## 2016-11-23 HISTORY — DX: Personal history of urinary calculi: Z87.442

## 2016-11-23 HISTORY — DX: Unspecified osteoarthritis, unspecified site: M19.90

## 2016-11-23 LAB — BASIC METABOLIC PANEL
Anion gap: 7 (ref 5–15)
BUN: 12 mg/dL (ref 6–20)
CO2: 28 mmol/L (ref 22–32)
CREATININE: 0.62 mg/dL (ref 0.44–1.00)
Calcium: 10.7 mg/dL — ABNORMAL HIGH (ref 8.9–10.3)
Chloride: 98 mmol/L — ABNORMAL LOW (ref 101–111)
GFR calc Af Amer: >60 mL/min (ref >60)
GFR calc non Af Amer: >60 mL/min (ref >60)
Glucose, Bld: 93 mg/dL (ref 65–99)
Potassium: 4.5 mmol/L (ref 3.5–5.1)
SODIUM: 133 mmol/L — AB (ref 135–145)

## 2016-11-23 LAB — CBC
HCT: 37.3 % (ref 36.0–46.0)
Hemoglobin: 12.3 g/dL (ref 12.0–15.0)
MCH: 37.5 pg — AB (ref 26.0–34.0)
MCHC: 33 g/dL (ref 30.0–36.0)
MCV: 113.7 fL — ABNORMAL HIGH (ref 78.0–100.0)
PLATELETS: 400 10*3/uL (ref 150–400)
RBC: 3.28 MIL/uL — ABNORMAL LOW (ref 3.87–5.11)
RDW: 15.5 % (ref 11.5–15.5)
WBC: 6.8 10*3/uL (ref 4.0–10.5)

## 2016-11-23 NOTE — Pre-Procedure Instructions (Addendum)
[place]  11/23/2016      [place], [place] [address] Phone: [phone] Fax: [phone]    Your procedure is scheduled on November 26, 2016.  Report to [facility] Admitting at 745 AM.  Call this number if you have problems the morning of surgery:  [phone]   Remember:  Do not eat food or drink liquids after midnight.  Take these medicines the morning of surgery with A SIP OF WATER allopurinol (zyloprim), fluticasone (flonase) nasal spray-if needed,hydralazine(apresoline) Levothyroxine (synthroid), hydroxyurea (hydrea), -if able to take on an empty stomach.  Stop Xarelto as instructed by your surgeon (2 days before surgery)11/23/16 today last dose   Starting today 11/23/16 STOP taking any Aspirin, Aleve, Naproxen, Ibuprofen, Motrin, Advil, Goody's, BC's, all herbal medications, fish oil, and all vitamins,florestor, melatonin  Continue all other medications as instructed by your physician except follow the above medication instructions before surgery   Do not wear jewelry, make-up or nail polish.  Do not wear lotions, powders, or perfumes, or deoderant.  Do not shave 48 hours prior to surgery.  Men may shave face and neck.  Do not bring valuables to the hospital.  [facility] is not responsible for any belongings or valuables.  Contacts, dentures or bridgework may not be worn into surgery.  Leave your suitcase in the car.  After surgery it may be brought to your room.  For patients admitted to the hospital, discharge time will be determined by your treatment team.  Patients discharged the day of surgery will not be allowed to drive home.   Special instructions:  [place]- Preparing For Surgery  Before surgery, you can play an important role. Because skin is not sterile, your skin needs to be as free of germs as possible. You can reduce the number of germs on  your skin by washing with CHG (chlorahexidine gluconate) Soap before surgery.  CHG is an antiseptic cleaner which kills germs and bonds with the skin to continue killing germs even after washing.  Please do not use if you have an allergy to CHG or antibacterial soaps. If your skin becomes reddened/irritated stop using the CHG.  Do not shave (including legs and underarms) for at least 48 hours prior to first CHG shower. It is OK to shave your face.  Please follow these instructions carefully.   1. Shower the NIGHT BEFORE SURGERY and the MORNING OF SURGERY with CHG.   2. If you chose to wash your hair, wash your hair first as usual with your normal shampoo.  3. After you shampoo, rinse your hair and body thoroughly to remove the shampoo.  4. Use CHG as you would any other liquid soap. You can apply CHG directly to the skin and wash gently with a scrungie or a clean washcloth.   5. Apply the CHG Soap to your body ONLY FROM THE NECK DOWN.  Do not use on open wounds or open sores. Avoid contact with your eyes, ears, mouth and genitals (private parts). Wash Face and genitals (private parts)  with your normal soap.  6. Wash thoroughly, paying special attention to the area where your surgery will be performed.  7. Thoroughly rinse your body with warm water from the neck down.  8. DO NOT shower/wash with your normal soap after using and rinsing off the CHG Soap.  9. Pat yourself dry with a CLEAN TOWEL.  10. Wear CLEAN PAJAMAS to bed the night before surgery, wear comfortable clothes the morning of surgery  11. Place CLEAN SHEETS on your bed the night of your first shower and DO NOT SLEEP WITH PETS.    Day of Surgery: Do not apply any deodorants/lotions. Please wear clean clothes to the hospital/surgery center.     Please read over the  fact sheets that you were given.

## 2016-11-23 NOTE — Pre-Procedure Instructions (Signed)
[place]  11/23/2016      [place], [place] [address] Phone: [phone] Fax: [phone]    Your procedure is scheduled on November 26, 2016.  Report to [facility] Admitting at 745 AM.  Call this number if you have problems the morning of surgery:  [phone]   Remember:  Do not eat food or drink liquids after midnight.  Take these medicines the morning of surgery with A SIP OF WATER allopurinol (zyloprim), fluticasone (flonase) nasal spray-if needed, gabapentin (neurontin),  Levothyroxine (synthroid), hydroxyurea (hydrea), -if able to take on an empty stomach.  Stop Xarelto as instructed by your surgeon (2 days before surgery)   7 days prior to surgery STOP taking any Aspirin, Aleve, Naproxen, Ibuprofen, Motrin, Advil, Goody's, BC's, all herbal medications, fish oil, and all vitamins  Continue all other medications as instructed by your physician except follow the above medication instructions before surgery  Do not wear jewelry, make-up or nail polish.  Do not wear lotions, powders, or perfumes, or deoderant.  Do not shave 48 hours prior to surgery.  Men may shave face and neck.  Do not bring valuables to the hospital.  [facility] is not responsible for any belongings or valuables.  Contacts, dentures or bridgework may not be worn into surgery.  Leave your suitcase in the car.  After surgery it may be brought to your room.  For patients admitted to the hospital, discharge time will be determined by your treatment team.  Patients discharged the day of surgery will not be allowed to drive home.   Special instructions:  [place]- Preparing For Surgery  Before surgery, you can play an important role. Because skin is not sterile, your skin needs to be as free of germs as possible. You can reduce the number of germs on your skin by washing with CHG  (chlorahexidine gluconate) Soap before surgery.  CHG is an antiseptic cleaner which kills germs and bonds with the skin to continue killing germs even after washing.  Please do not use if you have an allergy to CHG or antibacterial soaps. If your skin becomes reddened/irritated stop using the CHG.  Do not shave (including legs and underarms) for at least 48 hours prior to first CHG shower. It is OK to shave your face.  Please follow these instructions carefully.   1. Shower the NIGHT BEFORE SURGERY and the MORNING OF SURGERY with CHG.   2. If you chose to wash your hair, wash your hair first as usual with your normal shampoo.  3. After you shampoo, rinse your hair and body thoroughly to remove the shampoo.  4. Use CHG as you would any other liquid soap. You can apply CHG directly to the skin and wash gently with a scrungie or a clean washcloth.   5. Apply the CHG Soap to your body ONLY FROM THE NECK DOWN.  Do not use on open wounds or open sores. Avoid contact with your eyes, ears, mouth and genitals (private parts). Wash Face and genitals (private parts)  with your normal soap.  6. Wash thoroughly, paying special attention to the area where your surgery will be performed.  7. Thoroughly rinse your body with warm water from the neck down.  8. DO NOT shower/wash with your normal soap after using and rinsing off the CHG Soap.  9. Pat yourself dry with a CLEAN TOWEL.  10. Wear CLEAN PAJAMAS to bed the night before surgery, wear comfortable clothes the morning of surgery  11. Place CLEAN SHEETS on your bed the night of your first shower and DO NOT SLEEP WITH PETS.    Day of Surgery: Do not apply any deodorants/lotions. Please wear clean clothes to the hospital/surgery center.     Please read over the following fact sheets that you were given.

## 2016-11-24 NOTE — Progress Notes (Signed)
Anesthesia Chart Review:  Pt is an 83 year old female scheduled for excision R lateral neck mass on 11/26/2016 with Jerrell Belfast, MD  - PCP is a Hollace Kinnier, DO - CHF cardiologist is Loralie Champagne, MD who cleared pt for surgery. Last office visit 11/19/16 with Jettie Booze, NP - EP cardiologist is Thompson Grayer, MD  PMH includes:  PAF (s/p ablation 01/08/16), chronic diastolic CHF, sick sinus syndrome (no indication for pacing yet per Dr. Rayann Heman 09/06/16), HTN, hyperlipidemia, hypothyroidism, polycytemia vera, post-op N/V. Deaf in L ear. Former smoker. BMI 22. S/p R femoral IM nail 10/08/15.   Medications include: fentanyl patch, lasix, hydralazine, hydroxyurea, levothyroxine, potassium, xarelto. Pt to hold xarelto 2 days before surgery.   BP (!) 157/73   Pulse 78   Temp 36.8 C   Resp 18   Ht 5' (1.524 m)   Wt 112 lb 6.4 oz (51 kg)   SpO2 97%   BMI 21.95 kg/m   Preoperative labs reviewed.    EKG 09/17/16: Sinus rhythm with 1st degree A-V block with occasional PVCs and PACs. Possible LA enlargement. Nonspecific T wave abnormality  Holter monitor 02/26/16:  - Sinus rhythm with frequent PACs and PVCs.  Suspect occasional type I 2nd degree AV block.  Short atrial runs.  - Atrial fibrillation noted at least once.   Echo 08/05/15:  - Left ventricle: The cavity size was normal. Systolic function was normal. The estimated ejection fraction was in the range of 60% to 65%. Wall motion was normal; there were no regional wall motion abnormalities. - Aorta: Ascending aortic diameter: 38 mm (S). - Ascending aorta: The ascending aorta was mildly dilated. - Mitral valve: There was mild regurgitation. - Left atrium: The atrium was mildly dilated. - Pulmonary arteries: Systolic pressure was mildly increased. PA peak pressure: 41 mm Hg (S). - Pericardium, extracardiac: A trivial pericardial effusion was identified posterior to the heart. There was no evidence of hemodynamic compromise. - Impressions:  Compared to the prior study, there has been no significant interval change.  Nuclear stress test 09/02/14:   There was no ST segment deviation noted during stress.  No T wave inversion was noted during stress.  The study is normal.  The left ventricular ejection fraction is normal (55-65%).  Nuclear stress EF: 64%. - Normal study, no evidence for ischemia or infarction.  If no changes, I anticipate pt can proceed with surgery as scheduled.   Willeen Cass, FNP-BC [facility]/Anesthesiology Phone: [phone] 11/24/2016 12:00 PM

## 2016-11-26 ENCOUNTER — Ambulatory Visit (HOSPITAL_COMMUNITY): Payer: Medicare Other | Admitting: Certified Registered Nurse Anesthetist

## 2016-11-26 ENCOUNTER — Encounter (HOSPITAL_COMMUNITY)
Admission: RE | Disposition: A | Discharge status: Home / Self Care | Payer: Self-pay | Source: Ambulatory Visit | Attending: Otolaryngology

## 2016-11-26 ENCOUNTER — Encounter (HOSPITAL_COMMUNITY): Payer: Self-pay | Admitting: General Practice

## 2016-11-26 ENCOUNTER — Ambulatory Visit (HOSPITAL_COMMUNITY): Payer: Medicare Other | Admitting: Emergency Medicine

## 2016-11-26 ENCOUNTER — Ambulatory Visit (HOSPITAL_COMMUNITY)
Admission: RE | Admit: 2016-11-26 | Discharge: 2016-11-26 | Disposition: A | Discharge status: Home / Self Care | Payer: Medicare Other | Source: Ambulatory Visit | Attending: Otolaryngology | Admitting: Otolaryngology

## 2016-11-26 DIAGNOSIS — M109 Gout, unspecified: Secondary | ICD-10-CM | POA: Diagnosis not present

## 2016-11-26 DIAGNOSIS — Z87891 Personal history of nicotine dependence: Secondary | ICD-10-CM | POA: Diagnosis not present

## 2016-11-26 DIAGNOSIS — I110 Hypertensive heart disease with heart failure: Secondary | ICD-10-CM | POA: Diagnosis not present

## 2016-11-26 DIAGNOSIS — E039 Hypothyroidism, unspecified: Secondary | ICD-10-CM | POA: Insufficient documentation

## 2016-11-26 DIAGNOSIS — D170 Benign lipomatous neoplasm of skin and subcutaneous tissue of head, face and neck: Secondary | ICD-10-CM | POA: Insufficient documentation

## 2016-11-26 DIAGNOSIS — Z7901 Long term (current) use of anticoagulants: Secondary | ICD-10-CM | POA: Diagnosis not present

## 2016-11-26 DIAGNOSIS — R221 Localized swelling, mass and lump, neck: Secondary | ICD-10-CM | POA: Diagnosis not present

## 2016-11-26 DIAGNOSIS — I5032 Chronic diastolic (congestive) heart failure: Secondary | ICD-10-CM | POA: Diagnosis not present

## 2016-11-26 DIAGNOSIS — I2510 Atherosclerotic heart disease of native coronary artery without angina pectoris: Secondary | ICD-10-CM | POA: Diagnosis not present

## 2016-11-26 DIAGNOSIS — I4891 Unspecified atrial fibrillation: Secondary | ICD-10-CM | POA: Diagnosis not present

## 2016-11-26 DIAGNOSIS — Z79899 Other long term (current) drug therapy: Secondary | ICD-10-CM | POA: Insufficient documentation

## 2016-11-26 HISTORY — PX: MASS EXCISION: SHX2000

## 2016-11-26 LAB — PROTIME-INR
INR: 1
PROTHROMBIN TIME: 13.1 s (ref 11.4–15.2)

## 2016-11-26 SURGERY — EXCISION MASS
Anesthesia: General | Site: Neck | Laterality: Right

## 2016-11-26 MED ORDER — CEFAZOLIN SODIUM-DEXTROSE 2-4 GM/100ML-% IV SOLN
INTRAVENOUS | Status: AC
  Filled 2016-11-26: qty 100

## 2016-11-26 MED ORDER — LIDOCAINE HCL (CARDIAC) 20 MG/ML IV SOLN
INTRAVENOUS | Status: DC | PRN
  Administered 2016-11-26: 20 mg via INTRATRACHEAL

## 2016-11-26 MED ORDER — ONDANSETRON HCL 4 MG/2ML IJ SOLN
INTRAMUSCULAR | Status: DC | PRN
  Administered 2016-11-26: 4 mg via INTRAVENOUS

## 2016-11-26 MED ORDER — SUCCINYLCHOLINE CHLORIDE 20 MG/ML IJ SOLN
INTRAMUSCULAR | Status: DC | PRN
  Administered 2016-11-26: 100 mg via INTRAVENOUS

## 2016-11-26 MED ORDER — LACTATED RINGERS IV SOLN
INTRAVENOUS | Status: DC
  Administered 2016-11-26: 09:00:00 via INTRAVENOUS

## 2016-11-26 MED ORDER — 0.9 % SODIUM CHLORIDE (POUR BTL) OPTIME
TOPICAL | Status: DC | PRN
  Administered 2016-11-26: 1000 mL

## 2016-11-26 MED ORDER — CEFAZOLIN SODIUM-DEXTROSE 2-4 GM/100ML-% IV SOLN
2.0000 g | INTRAVENOUS | Status: AC
  Administered 2016-11-26: 2 g via INTRAVENOUS

## 2016-11-26 MED ORDER — PROPOFOL 10 MG/ML IV BOLUS
INTRAVENOUS | Status: AC
  Filled 2016-11-26: qty 20

## 2016-11-26 MED ORDER — PHENYLEPHRINE HCL 10 MG/ML IJ SOLN
INTRAMUSCULAR | Status: DC | PRN
  Administered 2016-11-26: 80 ug via INTRAVENOUS
  Administered 2016-11-26: 120 ug via INTRAVENOUS
  Administered 2016-11-26 (×4): 80 ug via INTRAVENOUS

## 2016-11-26 MED ORDER — FENTANYL CITRATE (PF) 250 MCG/5ML IJ SOLN
INTRAMUSCULAR | Status: AC
  Filled 2016-11-26: qty 5

## 2016-11-26 MED ORDER — FENTANYL CITRATE (PF) 100 MCG/2ML IJ SOLN
INTRAMUSCULAR | Status: DC | PRN
  Administered 2016-11-26: 50 ug via INTRAVENOUS

## 2016-11-26 MED ORDER — HYDROCODONE-ACETAMINOPHEN 5-325 MG PO TABS: 1.0000 | ORAL_TABLET | Freq: Four times a day (QID) | ORAL | 0 refills | Status: DC | PRN

## 2016-11-26 MED ORDER — PROPOFOL 10 MG/ML IV BOLUS
INTRAVENOUS | Status: DC | PRN
  Administered 2016-11-26: 80 mg via INTRAVENOUS

## 2016-11-26 MED ORDER — DEXAMETHASONE SODIUM PHOSPHATE 10 MG/ML IJ SOLN
10.0000 mg | Freq: Once | INTRAMUSCULAR | Status: AC
  Administered 2016-11-26: 5 mg via INTRAVENOUS
  Filled 2016-11-26: qty 1

## 2016-11-26 MED ORDER — LIDOCAINE-EPINEPHRINE 1 %-1:100000 IJ SOLN
INTRAMUSCULAR | Status: AC
  Filled 2016-11-26: qty 1

## 2016-11-26 MED ORDER — CHLORHEXIDINE GLUCONATE CLOTH 2 % EX PADS: 6.0000 | MEDICATED_PAD | Freq: Once | CUTANEOUS | Status: DC

## 2016-11-26 MED ORDER — LIDOCAINE-EPINEPHRINE 1 %-1:100000 IJ SOLN
INTRAMUSCULAR | Status: DC | PRN
  Administered 2016-11-26: 1 mL

## 2016-11-26 MED ORDER — EPHEDRINE SULFATE 50 MG/ML IJ SOLN
INTRAMUSCULAR | Status: DC | PRN
  Administered 2016-11-26: 5 mg via INTRAVENOUS

## 2016-11-26 SURGICAL SUPPLY — 26 items
ADH SKN CLS APL DERMABOND .7 (GAUZE/BANDAGES/DRESSINGS) ×1
BLADE SURG 15 STRL LF DISP TIS (BLADE) ×1 IMPLANT
BLADE SURG 15 STRL SS (BLADE) ×3
CLEANER TIP ELECTROSURG 2X2 (MISCELLANEOUS) IMPLANT
COVER SURGICAL LIGHT HANDLE (MISCELLANEOUS) ×3 IMPLANT
DERMABOND ADVANCED (GAUZE/BANDAGES/DRESSINGS) ×2
DERMABOND ADVANCED .7 DNX12 (GAUZE/BANDAGES/DRESSINGS) ×1 IMPLANT
DRAPE HALF SHEET 40X57 (DRAPES) IMPLANT
ELECT COATED BLADE 2.86 ST (ELECTRODE) ×3 IMPLANT
ELECT REM PT RETURN 9FT ADLT (ELECTROSURGICAL)
ELECTRODE REM PT RTRN 9FT ADLT (ELECTROSURGICAL) IMPLANT
GAUZE SPONGE 4X4 16PLY XRAY LF (GAUZE/BANDAGES/DRESSINGS) ×3 IMPLANT
GLOVE BIOGEL M 7.0 STRL (GLOVE) ×3 IMPLANT
GOWN STRL REUS W/ TWL LRG LVL3 (GOWN DISPOSABLE) ×2 IMPLANT
GOWN STRL REUS W/TWL LRG LVL3 (GOWN DISPOSABLE) ×6
KIT BASIN OR (CUSTOM PROCEDURE TRAY) ×3 IMPLANT
KIT ROOM TURNOVER OR (KITS) ×3 IMPLANT
NEEDLE HYPO 25GX1X1/2 BEV (NEEDLE) ×3 IMPLANT
NS IRRIG 1000ML POUR BTL (IV SOLUTION) ×3 IMPLANT
PAD ARMBOARD 7.5X6 YLW CONV (MISCELLANEOUS) ×6 IMPLANT
PENCIL BUTTON HOLSTER BLD 10FT (ELECTRODE) IMPLANT
SUT VIC AB 4-0 PS2 18 (SUTURE) ×2 IMPLANT
SUT VICRYL 4-0 PS2 18IN ABS (SUTURE) IMPLANT
SYR CONTROL 10ML LL (SYRINGE) ×3 IMPLANT
TOWEL OR 17X24 6PK STRL BLUE (TOWEL DISPOSABLE) ×3 IMPLANT
TRAY ENT MC OR (CUSTOM PROCEDURE TRAY) ×3 IMPLANT

## 2016-11-26 NOTE — H&P (Signed)
Sarah Mays is an 83 y.o. female.   Chief Complaint: Right neck mass HPI: Prog enlarging right neck mass  Past Medical History:  Diagnosis Date  . Abnormal stress test    a. 11/2011 Ex MV: EF 80%, small, partially reversible anteroapical defect->mild ischemia vs attenuation-->med Rx.  [name] Kitchen Alopecia, unspecified   . Arthritis   . Chronic diastolic CHF (congestive heart failure) ([place])    a. 11/2013 Echo: EF 60-65%, no rwma, mild TR, PASP 31mmHg.  . Closed fracture of lower end of radius with ulna   . Deafness    Left, s/p multiple surgeries  . Essential hypertension   . Gouty arthropathy, unspecified   . History of kidney stones   . Hypothyroidism   . Insomnia, unspecified   . Macular degeneration    Left, s/p inj. tx  . Mitral valve disorders(424.0)   . Neck mass    right, w/u including MRI negative  . PAF (paroxysmal atrial fibrillation) (H[facility])    a. on amio (02/2012 mild obstruction on PFT's) and xarelto.  . Polycythemia vera(238.4)   . PONV (postoperative nausea and vomiting)    no problem last surgery  . Pure hypercholesterolemia   . Senile osteoporosis    Reclast in the past  . Tricuspid valve disorders, specified as nonrheumatic     Past Surgical History:  Procedure Laterality Date  . ABLATION  08/28/14   Back   . BREAST BIOPSY  06/18/1998   left  . BREAST BIOPSY Right   . ELECTROPHYSIOLOGIC STUDY N/A 01/08/2016   Procedure: Atrial Fibrillation Ablation;  Surgeon: Sarah Grayer, MD;  Location: [facility];  Service: Cardiovascular;  Laterality: N/A;  . FEMUR IM NAIL Right 10/08/2015   Procedure: INTRAMEDULLARY (IM) NAIL FEMORAL RIGHT;  Surgeon: Sarah Cancel, MD;  Location: [facility];  Service: Orthopedics;  Laterality: Right;  . TONSILLECTOMY    . TONSILLECTOMY    . TOTAL ABDOMINAL HYSTERECTOMY    . TYMPANOPLASTY Bilateral     Family History  Problem Relation Age of Onset  . Hypertension Father   . Heart disease Father        patient does not know  details.   . Ovarian cancer Mother 67  . Cancer Sister        colon  . Heart disease Sister   . Cancer Sister   . Cancer Sister        hodgkin disease  . Breast cancer Daughter   . Cancer Daughter        breast   Social History:  reports that she quit smoking about 32 years ago. Her smoking use included Cigarettes. She has never used smokeless tobacco. She reports that she drinks about 0.6 oz of alcohol per week . She reports that she does not use drugs.  Allergies:  Allergies  Allergen Reactions  . Amlodipine Swelling and Other (See Comments)    Reaction:  Lower extremity swelling     Medications Prior to Admission  Medication Sig Dispense Refill  . allopurinol (ZYLOPRIM) 100 MG tablet Take 1 tablet (100 mg total) by mouth daily. 30 tablet 6  . amoxicillin-clavulanate (AUGMENTIN) 875-125 MG tablet Take 1 tablet by mouth 2 (two) times daily. 20 tablet 0  . cholecalciferol (VITAMIN D) 1000 UNITS tablet Take 2,000 Units by mouth daily.     . fentaNYL (DURAGESIC - DOSED MCG/HR) 25 MCG/HR patch Place 1 patch (25 mcg total) onto the skin every 3 (three) days. 10 patch 0  .  fluticasone (FLONASE) 50 MCG/ACT nasal spray Place 2 sprays into both nostrils 2 (two) times daily as needed (sinus infection).     . furosemide (LASIX) 40 MG tablet Take 80 mg by mouth every morning 180 tablet 2  . gabapentin (NEURONTIN) 100 MG capsule Take 300 mg by mouth at bedtime.     . hydrALAZINE (APRESOLINE) 50 MG tablet Take 1.5 tablets (75 mg total) by mouth 3 (three) times daily. 135 tablet 3  . hydroxyurea (HYDREA) 500 MG capsule May take with food to minimize GI side effects. Take 2 capsules (1000 mg) by mouth on Mon, Wed, and Fri and take 1 capsule (500 mg) on all other days.    [name] Kitchen levothyroxine (SYNTHROID, LEVOTHROID) 112 MCG tablet TAKE 1 TABLET EACH DAY. (Patient taking differently: TAKE 1 TABLET (112 MCG) BY MOUTH EACH DAY.) 30 tablet 5  . Magnesium 250 MG TABS Take 250 mg by mouth daily.     .  Melatonin 10 MG TABS Take 10 mg by mouth at bedtime.    . Multiple Vitamins-Minerals (PRESERVISION AREDS 2) CAPS Take 2 capsules by mouth daily.     . polycarbophil (FIBERCON) 625 MG tablet Take 2 tablets (1,250 mg total) by mouth 2 (two) times daily. 60 tablet 0  . potassium chloride (K-DUR) 10 MEQ tablet TAKE 1 TABLET TWICE DAILY. (Patient taking differently: TAKE (10 MEQ) TABLET BY MOUTH TWICE DAILY.) 60 tablet 0  . traMADol (ULTRAM) 50 MG tablet TAKE ONE TABLET AT BEDTIME. (Patient taking differently: TAKE 50 MG TABLET BY MOUTH AT BEDTIME.) 30 tablet 0  . XARELTO 15 MG TABS tablet TAKE 1 TABLET ONCE DAILY. 30 tablet 6  . saccharomyces boulardii (FLORASTOR) 250 MG capsule Take 1 capsule (250 mg total) by mouth 2 (two) times daily. 20 capsule 0    Results for orders placed or performed during the hospital encounter of 11/26/16 (from the past 48 hour(s))  Protime-INR     Status: None   Collection Time: 11/26/16  7:53 AM  Result Value Ref Range   Prothrombin Time 13.1 11.4 - 15.2 seconds   INR 1.00    No results found.  Review of Systems  Constitutional: Negative.   HENT: Negative.   Respiratory: Negative.   Cardiovascular: Negative.     Blood pressure (!) 169/85, pulse 97, temperature 97.7 F (36.5 C), temperature source Oral, resp. rate 20, height 5' (1.524 m), weight 51 kg (112 lb 6.4 oz), SpO2 97 %. Physical Exam  Constitutional: She appears well-developed and well-nourished.  Neck: Normal range of motion. Neck supple.  Right lateral neck mass  Cardiovascular: Normal rate.   Respiratory: Effort normal.     Assessment/Plan Adm for Exc right neck mass under GA as OP  Sarah Marcantel, MD 11/26/2016, 9:26 AM

## 2016-11-26 NOTE — Brief Op Note (Signed)
11/26/2016  10:32 AM  PATIENT:  Sarah Mays  83 y.o. female  PRE-OPERATIVE DIAGNOSIS:  NECK MASS  POST-OPERATIVE DIAGNOSIS:  NECK MASS  PROCEDURE:  Procedure(s): EXCISION RIGHT LATERAL NECK MASS (Right)  SURGEON:  Surgeon(s) and Role:    Jerrell Belfast, MD - Primary  PHYSICIAN ASSISTANT: Etta Grandchild, PA  ASSISTANTS: none   ANESTHESIA:   general  EBL:  Minimal  BLOOD ADMINISTERED:none  DRAINS: none   LOCAL MEDICATIONS USED:  LIDOCAINE  and Amount: 2 ml  SPECIMEN:  Source of Specimen:  Right neck mass  DISPOSITION OF SPECIMEN:  PATHOLOGY  COUNTS:  YES  TOURNIQUET:  * No tourniquets in log *  DICTATION: .Other Dictation: Dictation Number [id number]  PLAN OF CARE: Discharge to home after PACU  PATIENT DISPOSITION:  PACU - hemodynamically stable.   Delay start of Pharmacological VTE agent (>24hrs) due to surgical blood loss or risk of bleeding: not applicable

## 2016-11-26 NOTE — Anesthesia Postprocedure Evaluation (Signed)
Anesthesia Post Note  Patient: Sarah Mays  Procedure(s) Performed: EXCISION RIGHT LATERAL NECK MASS (Right Neck)     Patient location during evaluation: PACU Anesthesia Type: General Level of consciousness: awake and sedated Pain management: pain level controlled Vital Signs Assessment: post-procedure vital signs reviewed and stable Respiratory status: spontaneous breathing, nonlabored ventilation, respiratory function stable and patient connected to nasal cannula oxygen Cardiovascular status: blood pressure returned to baseline and stable Postop Assessment: no apparent nausea or vomiting Anesthetic complications: no    Last Vitals:  Vitals:   11/26/16 1110 11/26/16 1125  BP: 122/63 117/69  Pulse: 92 96  Resp: 14 13  Temp:    SpO2: 100% 94%    Last Pain:  Vitals:   11/26/16 0810  TempSrc: Oral                 Hollie Wojahn,JAMES TERRILL

## 2016-11-26 NOTE — Transfer of Care (Signed)
Immediate Anesthesia Transfer of Care Note  Patient: Sarah Mays  Procedure(s) Performed: EXCISION RIGHT LATERAL NECK MASS (Right Neck)  Patient Location: PACU  Anesthesia Type:General  Level of Consciousness: awake, alert , oriented and patient cooperative  Airway & Oxygen Therapy: Patient Spontanous Breathing and Patient connected to nasal cannula oxygen  Post-op Assessment: Report given to RN and Post -op Vital signs reviewed and stable  Post vital signs: Reviewed and stable  Last Vitals:  Vitals:   11/26/16 0810 11/26/16 1039  BP: (!) 169/85 (!) 161/78  Pulse: 97 (!) 105  Resp: 20 20  Temp: 36.5 C 36.6 C  SpO2: 97% 94%    Last Pain:  Vitals:   11/26/16 0810  TempSrc: Oral      Patients Stated Pain Goal: 5 (51/70/01 7494)  Complications: No apparent anesthesia complications

## 2016-11-26 NOTE — Anesthesia Preprocedure Evaluation (Signed)
Anesthesia Evaluation  Patient identified by MRN, date of birth, ID band Patient awake    Reviewed: Allergy & Precautions, NPO status , Patient's Chart, lab work & pertinent test results  History of Anesthesia Complications (+) PONV  Airway Mallampati: II  TM Distance: >3 FB Neck ROM: Full    Dental no notable dental hx.    Pulmonary former smoker,    breath sounds clear to auscultation       Cardiovascular hypertension, + CAD and +CHF  + dysrhythmias Atrial Fibrillation  Rhythm:Regular Rate:Normal     Neuro/Psych    GI/Hepatic negative GI ROS, Neg liver ROS,   Endo/Other  Hypothyroidism   Renal/GU negative Renal ROS     Musculoskeletal   Abdominal   Peds  Hematology   Anesthesia Other Findings   Reproductive/Obstetrics                             Anesthesia Physical Anesthesia Plan  ASA: III  Anesthesia Plan: General   Post-op Pain Management:    Induction: Intravenous  PONV Risk Score and Plan: 4 or greater and Ondansetron, Dexamethasone, Midazolam, Scopolamine patch - Pre-op, Propofol infusion and Treatment may vary due to age or medical condition  Airway Management Planned: LMA  Additional Equipment:   Intra-op Plan:   Post-operative Plan: Extubation in OR  Informed Consent: I have reviewed the patients History and Physical, chart, labs and discussed the procedure including the risks, benefits and alternatives for the proposed anesthesia with the patient or authorized representative who has indicated his/her understanding and acceptance.     Plan Discussed with: CRNA  Anesthesia Plan Comments:         Anesthesia Quick Evaluation

## 2016-11-26 NOTE — Anesthesia Procedure Notes (Signed)
Procedure Name: Intubation Date/Time: 11/26/2016 9:39 AM Performed by: Shirlyn Goltz Pre-anesthesia Checklist: Patient identified, Emergency Drugs available, Suction available and Patient being monitored Patient Re-evaluated:Patient Re-evaluated prior to induction Oxygen Delivery Method: Circle system utilized Preoxygenation: Pre-oxygenation with 100% oxygen Induction Type: IV induction Ventilation: Mask ventilation without difficulty Laryngoscope Size: Mac and 3 Grade View: Grade IV Tube type: Oral Tube size: 7.0 mm Number of attempts: 1 Airway Equipment and Method: Stylet and LTA kit utilized Placement Confirmation: ETT inserted through vocal cords under direct vision,  positive ETCO2 and breath sounds checked- equal and bilateral Secured at: 21 cm Tube secured with: Tape Dental Injury: Teeth and Oropharynx as per pre-operative assessment

## 2016-11-27 ENCOUNTER — Encounter (HOSPITAL_COMMUNITY): Payer: Self-pay | Admitting: Otolaryngology

## 2016-11-27 ENCOUNTER — Other Ambulatory Visit (HOSPITAL_COMMUNITY): Payer: Self-pay | Admitting: Cardiology

## 2016-11-27 ENCOUNTER — Other Ambulatory Visit: Payer: Self-pay | Admitting: Hematology and Oncology

## 2016-11-29 NOTE — Op Note (Signed)
NAME:  Sarah Mays, Sarah Mays                  ACCOUNT NO.:  MEDICAL RECORD NO.:  [id number]  LOCATION:                                 FACILITY:  PHYSICIAN:  Early Chars. Wilburn Cornelia, M.D.DATE OF BIRTH:  01-19-1931  DATE OF PROCEDURE:  11/26/2016 DATE OF DISCHARGE:                              OPERATIVE REPORT   LOCATION:  [facility] Main OR.  PREOPERATIVE DIAGNOSIS:  Right posterior lateral neck mass.  POSTOPERATIVE DIAGNOSIS:  Right posterior lateral neck mass.  INDICATION FOR SURGERY:  Right posterior lateral neck mass.  SURGICAL PROCEDURE:  Wide local excision of deep right lateral neck mass measuring 4 x 6 cm.  ANESTHESIA:  General endotracheal.  SURGEON:  Early Chars. Wilburn Cornelia, MD.  COMPLICATIONS:  None.  ESTIMATED BLOOD LOSS:  Minimal.  The patient was transferred from the operating room to the recovery room in stable condition.  BRIEF HISTORY:  The patient is an 83 year old white female, who has been followed in our office with a history of enlarging mass involving the right posterior lateral neck.  The patient was initially diagnosed with a lipoma many years ago and over the ensuing years, the mass has gradually gotten bigger and starting to cause symptoms of discomfort and local pain extending to the right ear and scalp.  Imaging study was performed, which showed a soft tissue mass consistent with possible lipoma in the right deep lateral neck.  A needle biopsy was performed, which also appeared benign.  Concerns were raised regarding the increasing discomfort and sizing of the mass and I recommended surgical excision under general anesthesia as an outpatient.  Risks and benefits of procedure were discussed in detail with the patient and her daughter and they understood and agreed with our plan for surgery, which was scheduled on an elective basis at [facility].  DESCRIPTION OF PROCEDURE:  The patient was brought to the operating room on November 26, 2016, and placed in supine position on the operating table.  General endotracheal anesthesia was established without difficulty.  When the patient adequately was anesthetized, she was positioned in a left lateral position to allow access to the right lateral neck.  A surgical time-out was then performed with correct identification of the patient and the surgical procedure including the right laterality of the mass.  Her neck was then injected with a total of 2 mL of 1% lidocaine with 1:100,000 solution epinephrine, which was injected in a subcutaneous fashion in the proposed incision line overlying the soft tissue mass.  The patient was prepped, draped, and prepared for surgery.  An incision was made with a #15 scalpel in a pre-existing skin crease overlying the mass.  The incision was approximately 4 cm in length.  The immediate subcutaneous tissue was divided using Bovie electrocautery.  The mass was evident in the deep aspect of the right lateral neck.  Careful dissection using Bovie electrocautery and blunt and sharp dissection allowed complete removal of the mass, dissecting along the posterior aspect of the sternocleidomastoid muscle as well as the trapezius muscle posteriorly.  The entire mass was removed and sent to Pathology for gross microscopic evaluation.  The wound  itself was then thoroughly irrigated.  Hemostasis was maintained using Bovie cautery.  The incision was then closed in multiple layers beginning with deep closure with interrupted 4-0 Vicryl suture, immediate subcutaneous closure with interrupted 4-0 Vicryl suture, and skin edge closure with Dermabond surgical glue.  No drain was placed.  There was no active bleeding.  The patient was awake from anesthetic.  She was extubated and then transferred from the operating room to the recovery room in stable condition.          ______________________________ Early Chars Wilburn Cornelia, M.D.     DLS/MEDQ  D:   35/52/1747  T:  11/26/2016  Job:  [id number]

## 2016-11-30 ENCOUNTER — Other Ambulatory Visit: Payer: Self-pay | Admitting: Internal Medicine

## 2016-11-30 ENCOUNTER — Other Ambulatory Visit (HOSPITAL_COMMUNITY): Payer: Self-pay | Admitting: *Deleted

## 2016-11-30 ENCOUNTER — Telehealth: Payer: Self-pay | Admitting: Hematology and Oncology

## 2016-11-30 MED ORDER — HYDRALAZINE HCL 50 MG PO TABS: 75.0000 mg | ORAL_TABLET | Freq: Three times a day (TID) | ORAL | 3 refills | Status: DC

## 2016-11-30 NOTE — Telephone Encounter (Signed)
Scheduled appt per 10/22 sch message - patient is aware of appt time and date.

## 2016-12-01 ENCOUNTER — Other Ambulatory Visit (HOSPITAL_COMMUNITY): Payer: Self-pay | Admitting: *Deleted

## 2016-12-01 MED ORDER — HYDRALAZINE HCL 50 MG PO TABS: 75.0000 mg | ORAL_TABLET | Freq: Three times a day (TID) | ORAL | 3 refills | Status: DC

## 2016-12-02 DIAGNOSIS — M47816 Spondylosis without myelopathy or radiculopathy, lumbar region: Secondary | ICD-10-CM | POA: Diagnosis not present

## 2016-12-02 DIAGNOSIS — Z23 Encounter for immunization: Secondary | ICD-10-CM | POA: Diagnosis not present

## 2016-12-06 ENCOUNTER — Other Ambulatory Visit: Payer: Self-pay | Admitting: *Deleted

## 2016-12-06 MED ORDER — TRAMADOL HCL 50 MG PO TABS: 50.0000 mg | ORAL_TABLET | Freq: Every day | ORAL | 0 refills | Status: DC

## 2016-12-06 NOTE — Telephone Encounter (Signed)
Patient requested Refill. Phoned to [facility].

## 2016-12-10 ENCOUNTER — Other Ambulatory Visit: Payer: Self-pay | Admitting: Internal Medicine

## 2016-12-13 ENCOUNTER — Ambulatory Visit (HOSPITAL_BASED_OUTPATIENT_CLINIC_OR_DEPARTMENT_OTHER): Payer: Medicare Other | Admitting: Hematology and Oncology

## 2016-12-13 ENCOUNTER — Encounter: Payer: Self-pay | Admitting: Hematology and Oncology

## 2016-12-13 ENCOUNTER — Other Ambulatory Visit (HOSPITAL_BASED_OUTPATIENT_CLINIC_OR_DEPARTMENT_OTHER): Payer: Medicare Other

## 2016-12-13 ENCOUNTER — Other Ambulatory Visit: Payer: Self-pay | Admitting: Hematology and Oncology

## 2016-12-13 DIAGNOSIS — D45 Polycythemia vera: Secondary | ICD-10-CM | POA: Diagnosis not present

## 2016-12-13 LAB — CBC WITH DIFFERENTIAL/PLATELET
BASO%: 0.7 % (ref 0.0–2.0)
Basophils Absolute: 0.1 10*3/uL (ref 0.0–0.1)
EOS%: 1 % (ref 0.0–7.0)
Eosinophils Absolute: 0.1 10*3/uL (ref 0.0–0.5)
HEMATOCRIT: 40.4 % (ref 34.8–46.6)
HEMOGLOBIN: 13.5 g/dL (ref 11.6–15.9)
LYMPH#: 1.2 10*3/uL (ref 0.9–3.3)
LYMPH%: 16.8 % (ref 14.0–49.7)
MCH: 38.4 pg — ABNORMAL HIGH (ref 25.1–34.0)
MCHC: 33.4 g/dL (ref 31.5–36.0)
MCV: 115.1 fL — ABNORMAL HIGH (ref 79.5–101.0)
MONO#: 0.8 10*3/uL (ref 0.1–0.9)
MONO%: 11 % (ref 0.0–14.0)
NEUT#: 5.1 10*3/uL (ref 1.5–6.5)
NEUT%: 70.5 % (ref 38.4–76.8)
Platelets: 425 10*3/uL — ABNORMAL HIGH (ref 145–400)
RBC: 3.51 10*6/uL — ABNORMAL LOW (ref 3.70–5.45)
RDW: 16.6 % — AB (ref 11.2–14.5)
WBC: 7.2 10*3/uL (ref 3.9–10.3)

## 2016-12-13 NOTE — Progress Notes (Signed)
[facility] OFFICE PROGRESS NOTE  Patient Care Team: Gayland Curry, DO as PCP - General (Geriatric Medicine) Community, [facility], Clent Demark, MD as Consulting Physician (Ophthalmology) Larey Dresser, MD as Consulting Physician (Cardiology) Heath Lark, MD as Consulting Physician (Hematology and Oncology) Clent Jacks, MD as Consulting Physician (Ophthalmology) Jerrell Belfast, MD as Consulting Physician (Otolaryngology)  SUMMARY OF ONCOLOGIC HISTORY:   Polycythemia vera ([place])   08/05/2011 Pathology Results    Peripheral blood JAK2 mutation was positive with low serum erythropoietin level. Bone marrow aspirate and biopsy was not performed.      08/12/2011 -  Chemotherapy    She is started on hydroxyurea with periodic phlebotomy.       INTERVAL HISTORY: Please see below for problem oriented charting. She returns for further follow-up She had interruption of hydroxyurea for 2 days for lipoma surgery on her neck She is doing well with no complications She denies recent infection She is compliant taking her medications as directed The patient denies any recent signs or symptoms of bleeding such as spontaneous epistaxis, hematuria or hematochezia.   REVIEW OF SYSTEMS:   Constitutional: Denies fevers, chills or abnormal weight loss Eyes: Denies blurriness of vision Ears, nose, mouth, throat, and face: Denies mucositis or sore throat Respiratory: Denies cough, dyspnea or wheezes Cardiovascular: Denies palpitation, chest discomfort or lower extremity swelling Gastrointestinal:  Denies nausea, heartburn or change in bowel habits Skin: Denies abnormal skin rashes Lymphatics: Denies new lymphadenopathy or easy bruising Neurological:Denies numbness, tingling or new weaknesses Behavioral/Psych: Mood is stable, no new changes  All other systems were reviewed with the patient and are negative.  I have reviewed the past medical history, past surgical  history, social history and family history with the patient and they are unchanged from previous note.  ALLERGIES:  is allergic to amlodipine.  MEDICATIONS:  Current Outpatient Medications  Medication Sig Dispense Refill  . allopurinol (ZYLOPRIM) 100 MG tablet Take 1 tablet (100 mg total) by mouth daily. 30 tablet 6  . amoxicillin-clavulanate (AUGMENTIN) 875-125 MG tablet Take 1 tablet by mouth 2 (two) times daily. 20 tablet 0  . cholecalciferol (VITAMIN D) 1000 UNITS tablet Take 2,000 Units by mouth daily.     . fentaNYL (DURAGESIC - DOSED MCG/HR) 25 MCG/HR patch Place 1 patch (25 mcg total) onto the skin every 3 (three) days. 10 patch 0  . fluticasone (FLONASE) 50 MCG/ACT nasal spray Place 2 sprays into both nostrils 2 (two) times daily as needed (sinus infection).     . furosemide (LASIX) 40 MG tablet Take 80 mg by mouth every morning 180 tablet 2  . gabapentin (NEURONTIN) 100 MG capsule Take 300 mg by mouth at bedtime.     . hydrALAZINE (APRESOLINE) 50 MG tablet Take 1.5 tablets (75 mg total) by mouth 3 (three) times daily. 135 tablet 3  . HYDROcodone-acetaminophen (NORCO) 5-325 MG tablet Take 1-2 tablets by mouth every 6 (six) hours as needed. 20 tablet 0  . hydroxyurea (HYDREA) 500 MG capsule TAKE 2 CAPSULES ON SATURDAYS AND SUNDAY AND 1 CAPSULE ALL OTHER DAYS OF THE WEEK. 90 capsule 0  . levothyroxine (SYNTHROID, LEVOTHROID) 112 MCG tablet TAKE 1 TABLET EACH DAY. 30 tablet 0  . Magnesium 250 MG TABS Take 250 mg by mouth daily.     . Melatonin 10 MG TABS Take 10 mg by mouth at bedtime.    . Multiple Vitamins-Minerals (PRESERVISION AREDS 2) CAPS Take 2 capsules by mouth daily.     [name] Kitchen  polycarbophil (FIBERCON) 625 MG tablet Take 2 tablets (1,250 mg total) by mouth 2 (two) times daily. 60 tablet 0  . potassium chloride (K-DUR) 10 MEQ tablet TAKE 1 TABLET TWICE DAILY. (Patient taking differently: TAKE (10 MEQ) TABLET BY MOUTH TWICE DAILY.) 60 tablet 0  . saccharomyces boulardii (FLORASTOR) 250  MG capsule Take 1 capsule (250 mg total) by mouth 2 (two) times daily. 20 capsule 0  . traMADol (ULTRAM) 50 MG tablet Take 1 tablet (50 mg total) by mouth at bedtime. 30 tablet 0  . XARELTO 15 MG TABS tablet TAKE 1 TABLET ONCE DAILY. 30 tablet 6   No current facility-administered medications for this visit.     PHYSICAL EXAMINATION: ECOG PERFORMANCE STATUS: 0 - Asymptomatic  Vitals:   12/13/16 1518  BP: 131/71  Pulse: 86  Resp: 20  Temp: 98.2 F (36.8 C)  SpO2: 99%   Filed Weights   12/13/16 1518  Weight: 110 lb 6.4 oz (50.1 kg)    GENERAL:alert, no distress and comfortable SKIN: skin color, texture, turgor are normal, no rashes or significant lesions.  Well-healed surgical scar on her right side of the neck EYES: normal, Conjunctiva are pink and non-injected, sclera clear OROPHARYNX:no exudate, no erythema and lips, buccal mucosa, and tongue normal  NECK: supple, thyroid normal size, non-tender, without nodularity LYMPH:  no palpable lymphadenopathy in the cervical, axillary or inguinal LUNGS: clear to auscultation and percussion with normal breathing effort HEART: regular rate & rhythm and no murmurs and no lower extremity edema ABDOMEN:abdomen soft, non-tender and normal bowel sounds Musculoskeletal:no cyanosis of digits and no clubbing  NEURO: alert & oriented x 3 with fluent speech, no focal motor/sensory deficits  LABORATORY DATA:  I have reviewed the data as listed    Component Value Date/Time   NA 133 (L) 11/23/2016 1139   NA 136 09/14/2016 1428   K 4.5 11/23/2016 1139   K 4.0 09/14/2016 1428   CL 98 (L) 11/23/2016 1139   CO2 28 11/23/2016 1139   CO2 30 (H) 09/14/2016 1428   GLUCOSE 93 11/23/2016 1139   GLUCOSE 112 09/14/2016 1428   BUN 12 11/23/2016 1139   BUN 14.7 09/14/2016 1428   CREATININE 0.62 11/23/2016 1139   CREATININE 0.8 09/14/2016 1428   CALCIUM 10.7 (H) 11/23/2016 1139   CALCIUM 11.0 (H) 09/14/2016 1428   PROT 7.0 09/14/2016 1428   ALBUMIN  4.6 09/14/2016 1428   AST 17 09/14/2016 1428   ALT 18 09/14/2016 1428   ALKPHOS 62 09/14/2016 1428   BILITOT 1.00 09/14/2016 1428   GFRNONAA >60 11/23/2016 1139   GFRAA >60 11/23/2016 1139    No results found for: SPEP, UPEP  Lab Results  Component Value Date   WBC 7.2 12/13/2016   NEUTROABS 5.1 12/13/2016   HGB 13.5 12/13/2016   HCT 40.4 12/13/2016   MCV 115.1 (H) 12/13/2016   PLT 425 (H) 12/13/2016      Chemistry      Component Value Date/Time   NA 133 (L) 11/23/2016 1139   NA 136 09/14/2016 1428   K 4.5 11/23/2016 1139   K 4.0 09/14/2016 1428   CL 98 (L) 11/23/2016 1139   CO2 28 11/23/2016 1139   CO2 30 (H) 09/14/2016 1428   BUN 12 11/23/2016 1139   BUN 14.7 09/14/2016 1428   CREATININE 0.62 11/23/2016 1139   CREATININE 0.8 09/14/2016 1428   GLU 80 10/29/2014      Component Value Date/Time   CALCIUM 10.7 (H) 11/23/2016  1139   CALCIUM 11.0 (H) 09/14/2016 1428   ALKPHOS 62 09/14/2016 1428   AST 17 09/14/2016 1428   ALT 18 09/14/2016 1428   BILITOT 1.00 09/14/2016 1428       ASSESSMENT & PLAN:  Polycythemia vera Her CBC is showing mild improvement The patient stated she has been compliant taking medications as directed She would take 1000 mg on Mondays, Wednesdays and Fridays and to take 500 mg for the rest of the week She will continue to take Xarelto to prevent risk of blood clot. With stability of her blood count, I will see her back in 3 months Even though she had mild persistent thrombocytosis, I do not feel strongly she needs increased dose of hydroxyurea   No orders of the defined types were placed in this encounter.  All questions were answered. The patient knows to call the clinic with any problems, questions or concerns. No barriers to learning was detected. I spent 10 minutes counseling the patient face to face. The total time spent in the appointment was 15 minutes and more than 50% was on counseling and review of test results     Heath Lark, MD 12/13/2016 3:50 PM

## 2016-12-13 NOTE — Assessment & Plan Note (Signed)
Her CBC is showing mild improvement The patient stated she has been compliant taking medications as directed She would take 1000 mg on Mondays, Wednesdays and Fridays and to take 500 mg for the rest of the week She will continue to take Xarelto to prevent risk of blood clot. With stability of her blood count, I will see her back in 3 months Even though she had mild persistent thrombocytosis, I do not feel strongly she needs increased dose of hydroxyurea

## 2016-12-14 ENCOUNTER — Other Ambulatory Visit: Payer: Self-pay

## 2016-12-14 ENCOUNTER — Other Ambulatory Visit: Payer: Medicare Other

## 2016-12-14 ENCOUNTER — Non-Acute Institutional Stay: Payer: Medicare Other

## 2016-12-14 ENCOUNTER — Ambulatory Visit: Payer: Medicare Other | Admitting: Hematology and Oncology

## 2016-12-14 VITALS — BP 118/62 | HR 91 | Temp 98.2°F | Ht 60.0 in | Wt 111.0 lb

## 2016-12-14 DIAGNOSIS — Z0000 Encounter for general adult medical examination without abnormal findings: Secondary | ICD-10-CM | POA: Diagnosis not present

## 2016-12-14 MED ORDER — TETANUS-DIPHTH-ACELL PERTUSSIS 5-2.5-18.5 LF-MCG/0.5 IM SUSP: 0.5000 mL | Freq: Once | INTRAMUSCULAR | 0 refills | Status: AC

## 2016-12-14 NOTE — Telephone Encounter (Signed)
Dee, we can provide her Rx 11/7 to be filled 11/8.

## 2016-12-14 NOTE — Patient Instructions (Signed)
Sarah Mays , Thank you for taking time to come for your Medicare Wellness Visit. I appreciate your ongoing commitment to your health goals. Please review the following plan we discussed and let me know if I can assist you in the future.   Screening recommendations/referrals: Colonoscopy excluded, you are over age 81 Mammogram excluded, you are over age 52 Bone Density up to date Recommended yearly ophthalmology/optometry visit for glaucoma screening and checkup Recommended yearly dental visit for hygiene and checkup  Vaccinations: Influenza vaccine up to date. Due 2019 fall season Pneumococcal vaccine up to date Tdap vaccine due, prescription sent to pharmacy Shingles vaccine due, you are waiting for your pharmacy to get it in stock.    Advanced directives: In Chart  Conditions/risks identified: None  Next appointment: Dr. Mariea Clonts 01/26/2017 @ 1:30pm   Preventive Care 65 Years and Older, Female Preventive care refers to lifestyle choices and visits with your health care provider that can promote health and wellness. What does preventive care include?  A yearly physical exam. This is also called an annual well check.  Dental exams once or twice a year.  Routine eye exams. Ask your health care provider how often you should have your eyes checked.  Personal lifestyle choices, including:  Daily care of your teeth and gums.  Regular physical activity.  Eating a healthy diet.  Avoiding tobacco and drug use.  Limiting alcohol use.  Practicing safe sex.  Taking low-dose aspirin every day.  Taking vitamin and mineral supplements as recommended by your health care provider. What happens during an annual well check? The services and screenings done by your health care provider during your annual well check will depend on your age, overall health, lifestyle risk factors, and family history of disease. Counseling  Your health care provider may ask you questions about  your:  Alcohol use.  Tobacco use.  Drug use.  Emotional well-being.  Home and relationship well-being.  Sexual activity.  Eating habits.  History of falls.  Memory and ability to understand (cognition).  Work and work Statistician.  Reproductive health. Screening  You may have the following tests or measurements:  Height, weight, and BMI.  Blood pressure.  Lipid and cholesterol levels. These may be checked every 5 years, or more frequently if you are over 74 years old.  Skin check.  Lung cancer screening. You may have this screening every year starting at age 30 if you have a 30-pack-year history of smoking and currently smoke or have quit within the past 15 years.  Fecal occult blood test (FOBT) of the stool. You may have this test every year starting at age 11.  Flexible sigmoidoscopy or colonoscopy. You may have a sigmoidoscopy every 5 years or a colonoscopy every 10 years starting at age 82.  Hepatitis C blood test.  Hepatitis B blood test.  Sexually transmitted disease (STD) testing.  Diabetes screening. This is done by checking your blood sugar (glucose) after you have not eaten for a while (fasting). You may have this done every 1-3 years.  Bone density scan. This is done to screen for osteoporosis. You may have this done starting at age 68.  Mammogram. This may be done every 1-2 years. Talk to your health care provider about how often you should have regular mammograms. Talk with your health care provider about your test results, treatment options, and if necessary, the need for more tests. Vaccines  Your health care provider may recommend certain vaccines, such as:  Influenza vaccine.  This is recommended every year.  Tetanus, diphtheria, and acellular pertussis (Tdap, Td) vaccine. You may need a Td booster every 10 years.  Zoster vaccine. You may need this after age 68.  Pneumococcal 13-valent conjugate (PCV13) vaccine. One dose is recommended  after age 76.  Pneumococcal polysaccharide (PPSV23) vaccine. One dose is recommended after age 20. Talk to your health care provider about which screenings and vaccines you need and how often you need them. This information is not intended to replace advice given to you by your health care provider. Make sure you discuss any questions you have with your health care provider. Document Released: 02/21/2015 Document Revised: 10/15/2015 Document Reviewed: 11/26/2014 Elsevier Interactive Patient Education  2017 [place] Prevention in the Home Falls can cause injuries. They can happen to people of all ages. There are many things you can do to make your home safe and to help prevent falls. What can I do on the outside of my home?  Regularly fix the edges of walkways and driveways and fix any cracks.  Remove anything that might make you trip as you walk through a door, such as a raised step or threshold.  Trim any bushes or trees on the path to your home.  Use bright outdoor lighting.  Clear any walking paths of anything that might make someone trip, such as rocks or tools.  Regularly check to see if handrails are loose or broken. Make sure that both sides of any steps have handrails.  Any raised decks and porches should have guardrails on the edges.  Have any leaves, snow, or ice cleared regularly.  Use sand or salt on walking paths during winter.  Clean up any spills in your garage right away. This includes oil or grease spills. What can I do in the bathroom?  Use night lights.  Install grab bars by the toilet and in the tub and shower. Do not use towel bars as grab bars.  Use non-skid mats or decals in the tub or shower.  If you need to sit down in the shower, use a plastic, non-slip stool.  Keep the floor dry. Clean up any water that spills on the floor as soon as it happens.  Remove soap buildup in the tub or shower regularly.  Attach bath mats securely with  double-sided non-slip rug tape.  Do not have throw rugs and other things on the floor that can make you trip. What can I do in the bedroom?  Use night lights.  Make sure that you have a light by your bed that is easy to reach.  Do not use any sheets or blankets that are too big for your bed. They should not hang down onto the floor.  Have a firm chair that has side arms. You can use this for support while you get dressed.  Do not have throw rugs and other things on the floor that can make you trip. What can I do in the kitchen?  Clean up any spills right away.  Avoid walking on wet floors.  Keep items that you use a lot in easy-to-reach places.  If you need to reach something above you, use a strong step stool that has a grab bar.  Keep electrical cords out of the way.  Do not use floor polish or wax that makes floors slippery. If you must use wax, use non-skid floor wax.  Do not have throw rugs and other things on the floor that can make  you trip. What can I do with my stairs?  Do not leave any items on the stairs.  Make sure that there are handrails on both sides of the stairs and use them. Fix handrails that are broken or loose. Make sure that handrails are as long as the stairways.  Check any carpeting to make sure that it is firmly attached to the stairs. Fix any carpet that is loose or worn.  Avoid having throw rugs at the top or bottom of the stairs. If you do have throw rugs, attach them to the floor with carpet tape.  Make sure that you have a light switch at the top of the stairs and the bottom of the stairs. If you do not have them, ask someone to add them for you. What else can I do to help prevent falls?  Wear shoes that:  Do not have high heels.  Have rubber bottoms.  Are comfortable and fit you well.  Are closed at the toe. Do not wear sandals.  If you use a stepladder:  Make sure that it is fully opened. Do not climb a closed stepladder.  Make  sure that both sides of the stepladder are locked into place.  Ask someone to hold it for you, if possible.  Clearly mark and make sure that you can see:  Any grab bars or handrails.  First and last steps.  Where the edge of each step is.  Use tools that help you move around (mobility aids) if they are needed. These include:  Canes.  Walkers.  Scooters.  Crutches.  Turn on the lights when you go into a dark area. Replace any light bulbs as soon as they burn out.  Set up your furniture so you have a clear path. Avoid moving your furniture around.  If any of your floors are uneven, fix them.  If there are any pets around you, be aware of where they are.  Review your medicines with your doctor. Some medicines can make you feel dizzy. This can increase your chance of falling. Ask your doctor what other things that you can do to help prevent falls. This information is not intended to replace advice given to you by your health care provider. Make sure you discuss any questions you have with your health care provider. Document Released: 11/21/2008 Document Revised: 07/03/2015 Document Reviewed: 03/01/2014 Elsevier Interactive Patient Education  2017 [employer].

## 2016-12-14 NOTE — Progress Notes (Signed)
Subjective:   Sarah Mays is a 83 y.o. female who presents for Medicare Annual (Subsequent) preventive examination at [facility]       Objective:     Vitals: BP 118/62 (BP Location: Right Arm, Patient Position: Sitting)   Pulse 91   Temp 98.2 F (36.8 C) (Oral)   Ht 5' (1.524 m)   Wt 111 lb (50.3 kg)   SpO2 95%   BMI 21.68 kg/m   Body mass index is 21.68 kg/m.   Tobacco Social History   Tobacco Use  Smoking Status Former Smoker  . Types: Cigarettes  . Last attempt to quit: 01/19/1984  . Years since quitting: 32.9  Smokeless Tobacco Never Used     Counseling given: Not Answered   Past Medical History:  Diagnosis Date  . Abnormal stress test    a. 11/2011 Ex MV: EF 80%, small, partially reversible anteroapical defect->mild ischemia vs attenuation-->med Rx.  [name] Kitchen Alopecia, unspecified   . Arthritis   . Chronic diastolic CHF (congestive heart failure) ([place])    a. 11/2013 Echo: EF 60-65%, no rwma, mild TR, PASP 34mmHg.  . Closed fracture of lower end of radius with ulna   . Deafness    Left, s/p multiple surgeries  . Essential hypertension   . Gouty arthropathy, unspecified   . History of kidney stones   . Hypothyroidism   . Insomnia, unspecified   . Macular degeneration    Left, s/p inj. tx  . Mitral valve disorders(424.0)   . Neck mass    right, w/u including MRI negative  . PAF (paroxysmal atrial fibrillation) (H[facility])    a. on amio (02/2012 mild obstruction on PFT's) and xarelto.  . Polycythemia vera(238.4)   . PONV (postoperative nausea and vomiting)    no problem last surgery  . Pure hypercholesterolemia   . Senile osteoporosis    Reclast in the past  . Tricuspid valve disorders, specified as nonrheumatic    Past Surgical History:  Procedure Laterality Date  . ABLATION  08/28/14   Back   . BREAST BIOPSY  06/18/1998   left  . BREAST BIOPSY Right   . TONSILLECTOMY    . TONSILLECTOMY    . TOTAL ABDOMINAL HYSTERECTOMY      . TYMPANOPLASTY Bilateral    Family History  Problem Relation Age of Onset  . Hypertension Father   . Heart disease Father        patient does not know details.   . Ovarian cancer Mother 4  . Cancer Sister        colon  . Heart disease Sister   . Cancer Sister   . Cancer Sister        hodgkin disease  . Breast cancer Daughter   . Cancer Daughter        breast   Social History   Substance and Sexual Activity  Sexual Activity Not Currently    Outpatient Encounter Medications as of 12/14/2016  Medication Sig  . allopurinol (ZYLOPRIM) 100 MG tablet Take 1 tablet (100 mg total) by mouth daily.  . cholecalciferol (VITAMIN D) 1000 UNITS tablet Take 2,000 Units by mouth daily.   . fentaNYL (DURAGESIC - DOSED MCG/HR) 25 MCG/HR patch Place 1 patch (25 mcg total) onto the skin every 3 (three) days.  . fluticasone (FLONASE) 50 MCG/ACT nasal spray Place 2 sprays into both nostrils 2 (two) times daily as needed (sinus infection).   . furosemide (LASIX) 40 MG tablet Take 80  mg by mouth every morning  . gabapentin (NEURONTIN) 100 MG capsule Take 300 mg by mouth at bedtime.   . hydrALAZINE (APRESOLINE) 50 MG tablet Take 1.5 tablets (75 mg total) by mouth 3 (three) times daily.  . hydroxyurea (HYDREA) 500 MG capsule Take 500 mg daily by mouth. May take with food to minimize GI side effects. MWF twice a day. Rest of the week 1 time a day  . levothyroxine (SYNTHROID, LEVOTHROID) 112 MCG tablet TAKE 1 TABLET EACH DAY.  . Magnesium 250 MG TABS Take 250 mg by mouth daily.   . Melatonin 10 MG TABS Take 10 mg by mouth at bedtime.  . Multiple Vitamins-Minerals (PRESERVISION AREDS 2) CAPS Take 2 capsules by mouth daily.   . polycarbophil (FIBERCON) 625 MG tablet Take 2 tablets (1,250 mg total) by mouth 2 (two) times daily.  . traMADol (ULTRAM) 50 MG tablet Take 1 tablet (50 mg total) by mouth at bedtime.  Alveda Reasons 15 MG TABS tablet TAKE 1 TABLET ONCE DAILY.  [name] Kitchen potassium chloride (K-DUR) 10 MEQ  tablet TAKE 1 TABLET TWICE DAILY. (Patient taking differently: TAKE (10 MEQ) TABLET BY MOUTH TWICE DAILY.)  . [DISCONTINUED] amoxicillin-clavulanate (AUGMENTIN) 875-125 MG tablet Take 1 tablet by mouth 2 (two) times daily.  . [DISCONTINUED] HYDROcodone-acetaminophen (NORCO) 5-325 MG tablet Take 1-2 tablets by mouth every 6 (six) hours as needed.  . [DISCONTINUED] hydroxyurea (HYDREA) 500 MG capsule TAKE 2 CAPSULES ON SATURDAYS AND SUNDAY AND 1 CAPSULE ALL OTHER DAYS OF THE WEEK.  . [DISCONTINUED] saccharomyces boulardii (FLORASTOR) 250 MG capsule Take 1 capsule (250 mg total) by mouth 2 (two) times daily.   No facility-administered encounter medications on file as of 12/14/2016.     Activities of Daily Living In your present state of health, do you have any difficulty performing the following activities: 12/14/2016 11/23/2016  Hearing? Tempie Donning  Vision? N N  Difficulty concentrating or making decisions? N N  Walking or climbing stairs? Y Y  Dressing or bathing? N N  Doing errands, shopping? Y N  Preparing Food and eating ? N -  Using the Toilet? N -  In the past six months, have you accidently leaked urine? N -  Do you have problems with loss of bowel control? N -  Managing your Medications? N -  Managing your Finances? N -  Housekeeping or managing your Housekeeping? N -  Some recent data might be hidden    Patient Care Team: Gayland Curry, DO as PCP - General (Geriatric Medicine) [facility], Clent Demark, MD as Consulting Physician (Ophthalmology) Larey Dresser, MD as Consulting Physician (Cardiology) Heath Lark, MD as Consulting Physician (Hematology and Oncology) Clent Jacks, MD as Consulting Physician (Ophthalmology) Jerrell Belfast, MD as Consulting Physician (Otolaryngology)    Assessment:      Exercise Activities and Dietary recommendations Current Exercise Habits: The patient does not participate in regular exercise at present, Exercise  limited by: orthopedic condition(s)  Goals    None     Fall Risk Fall Risk  12/14/2016 07/14/2016 06/04/2016 04/08/2016 03/17/2016  Falls in the past year? No No No No No  Risk for fall due to : - - - - -   Depression Screen PHQ 2/9 Scores 12/14/2016 07/14/2016 06/04/2016 03/17/2016  PHQ - 2 Score 0 0 0 0     Cognitive Function MMSE - Mini Mental State Exam 12/14/2016 08/06/2015  Orientation to time 5 4  Orientation to Place 5 5  Registration 3 3  Attention/ Calculation 5 5  Recall 3 3  Language- name 2 objects 2 2  Language- repeat 1 1  Language- follow 3 step command 3 3  Language- read & follow direction 1 1  Write a sentence 1 1  Copy design 1 1  Total score 30 29        Immunization History  Administered Date(s) Administered  . Influenza Inj Mdck Quad Pf 11/27/2015  . Influenza,inj,Quad PF,6+ Mos 10/11/2013  . Influenza-Unspecified 11/08/2012, 11/28/2014  . Pneumococcal Conjugate-13 11/06/2014  . Pneumococcal Polysaccharide-23 10/18/2007  . Zoster 06/27/2006   Screening Tests Health Maintenance  Topic Date Due  . TETANUS/TDAP  08/03/1949  . INFLUENZA VACCINE  09/08/2016  . DEXA SCAN  Completed  . PNA vac Low Risk Adult  Completed      Plan:    I have personally reviewed and addressed the Medicare Annual Wellness questionnaire and have noted the following in the patient's chart:  A. Medical and social history B. Use of alcohol, tobacco or illicit drugs  C. Current medications and supplements D. Functional ability and status E.  Nutritional status F.  Physical activity G. Advance directives H. List of other physicians I.  Hospitalizations, surgeries, and ER visits in previous 12 months J.  [place] to include hearing, vision, cognitive, depression L. Referrals and appointments - none  In addition, I have reviewed and discussed with patient certain preventive protocols, quality metrics, and best practice recommendations. A written personalized care  plan for preventive services as well as general preventive health recommendations were provided to patient.  See attached scanned questionnaire for additional information.   Signed,   Rich Reining, RN Nurse Health Advisor   Quick Notes   Health Maintenance:      Abnormal Screen: MMSE 30/30. Passed clock drawing     Patient Concerns: Intermittent pain on left back side of head radiating to  Neck- will last for a few hours.      Nurse Concerns: None

## 2016-12-14 NOTE — Telephone Encounter (Signed)
Patient called requesting refill on her Fentanyl Patch. I informed patient that we will place her on the list for Thursday 12/16/16.   RX last filled on 11/16/16, patient states she had to use extra patches when in the hospital and needs rx filled now. I informed patient that she is not suppose to use extra patches without instructions from Dr.Reed to do so.  Patient states I understand all that, just ask Dr.Reed if she can give me my RX while she is here at [facility]  [place] Database verified and compliance confirmed   Dr.Reed please advise

## 2016-12-15 MED ORDER — FENTANYL 25 MCG/HR TD PT72: 25.0000 ug | MEDICATED_PATCH | TRANSDERMAL | 0 refills | Status: DC

## 2016-12-15 NOTE — Telephone Encounter (Signed)
Left message on machine that rx is ready for pick-up, and it will be at our front desk at [place]

## 2016-12-16 ENCOUNTER — Telehealth: Payer: Self-pay | Admitting: Internal Medicine

## 2016-12-16 NOTE — Telephone Encounter (Signed)
Pt seen 08/2016.  Was told to f/u in 4 months.  Has appt with [facility] 11/26 but needs to cancel due to another appt.  Pt wants to know if Dr. Rayann Heman is ok with her coming in January instead?  Advised I would send message to Dr. Rayann Heman to see since he wanted her back in 4 months.  Pt appreciative for assistance.

## 2016-12-16 NOTE — Telephone Encounter (Signed)
January is fine

## 2016-12-16 NOTE — Telephone Encounter (Signed)
New message  Pt verbalized that she is calling for the rn  Did not give resason

## 2016-12-17 NOTE — Telephone Encounter (Signed)
Spoke with patient and changed office visit appointment to February 18, 2017 @ 3:00 pm.

## 2016-12-27 ENCOUNTER — Encounter (HOSPITAL_COMMUNITY): Payer: Self-pay | Admitting: Cardiology

## 2016-12-27 ENCOUNTER — Ambulatory Visit (HOSPITAL_COMMUNITY)
Admission: RE | Admit: 2016-12-27 | Discharge: 2016-12-27 | Disposition: A | Discharge status: Home / Self Care | Payer: Medicare Other | Source: Ambulatory Visit | Attending: Cardiology | Admitting: Cardiology

## 2016-12-27 VITALS — BP 138/68 | HR 92 | Wt 110.4 lb

## 2016-12-27 DIAGNOSIS — I481 Persistent atrial fibrillation: Secondary | ICD-10-CM | POA: Insufficient documentation

## 2016-12-27 DIAGNOSIS — H3530 Unspecified macular degeneration: Secondary | ICD-10-CM | POA: Diagnosis not present

## 2016-12-27 DIAGNOSIS — Z79899 Other long term (current) drug therapy: Secondary | ICD-10-CM | POA: Insufficient documentation

## 2016-12-27 DIAGNOSIS — Z7901 Long term (current) use of anticoagulants: Secondary | ICD-10-CM | POA: Diagnosis not present

## 2016-12-27 DIAGNOSIS — Z8249 Family history of ischemic heart disease and other diseases of the circulatory system: Secondary | ICD-10-CM | POA: Diagnosis not present

## 2016-12-27 DIAGNOSIS — I5032 Chronic diastolic (congestive) heart failure: Secondary | ICD-10-CM | POA: Insufficient documentation

## 2016-12-27 DIAGNOSIS — Z9889 Other specified postprocedural states: Secondary | ICD-10-CM | POA: Diagnosis not present

## 2016-12-27 DIAGNOSIS — I110 Hypertensive heart disease with heart failure: Secondary | ICD-10-CM | POA: Insufficient documentation

## 2016-12-27 DIAGNOSIS — R001 Bradycardia, unspecified: Secondary | ICD-10-CM | POA: Insufficient documentation

## 2016-12-27 DIAGNOSIS — E039 Hypothyroidism, unspecified: Secondary | ICD-10-CM | POA: Insufficient documentation

## 2016-12-27 DIAGNOSIS — Z87891 Personal history of nicotine dependence: Secondary | ICD-10-CM | POA: Insufficient documentation

## 2016-12-27 DIAGNOSIS — I10 Essential (primary) hypertension: Secondary | ICD-10-CM | POA: Diagnosis not present

## 2016-12-27 DIAGNOSIS — R5383 Other fatigue: Secondary | ICD-10-CM | POA: Insufficient documentation

## 2016-12-27 DIAGNOSIS — I480 Paroxysmal atrial fibrillation: Secondary | ICD-10-CM | POA: Diagnosis not present

## 2016-12-27 LAB — CBC
HEMATOCRIT: 38.4 % (ref 36.0–46.0)
HEMOGLOBIN: 12.8 g/dL (ref 12.0–15.0)
MCH: 37.3 pg — AB (ref 26.0–34.0)
MCHC: 33.3 g/dL (ref 30.0–36.0)
MCV: 112 fL — AB (ref 78.0–100.0)
Platelets: 444 10*3/uL — ABNORMAL HIGH (ref 150–400)
RBC: 3.43 MIL/uL — AB (ref 3.87–5.11)
RDW: 15 % (ref 11.5–15.5)
WBC: 9.5 10*3/uL (ref 4.0–10.5)

## 2016-12-27 LAB — TSH: TSH: 0.194 u[IU]/mL — AB (ref 0.350–4.500)

## 2016-12-27 NOTE — Progress Notes (Signed)
Patient ID: Sarah Mays, female   DOB: Jan 16, 1931, 83 y.o.   MRN: [id number] PCP: Dr Hollace Kinnier Cardiology: Dr. Aundra Dubin  83 yo with paroxysmal atrial fibrillation and chronic diastolic CHF returns for cardiology evaluation.  In 7/16, she was admitted with chest pain.  Cardiolite showed no ischemia or infarction.  Echo in 6/17 showed EF 60-65%.  She developed problems elevated HR when in atrial fibrillation but bradycardia when in NSR.  She underwent atrial fibrillation ablation in 11/17.  She is tolerating Xarelto without melena or BRBPR.  Dr. Rayann Heman had her stop amiodarone.    She presents today for regular follow up of CHF and atrial fibrillation. She has been having nose bleeds. Most recently on 11/2, 11/4, and 11/17. The most recent one took an entire towel to stop. She had to call staff at [facility] to come and help her. She has been more fatigued recently. No chest pain.  No significant exertional dyspnea though not very active due to fatigue.    ECG: NSR, PVC, PAC, rSR' V1 (personally reviewed)  Labs (7/16): K 4, creatinine 0.94, calcium 10.8, LFTs normal Labs (9/16): K 4, creatinine 0.9, LDL 69, TSH normal, HCT 42.3 Labs (5/17): K 4.6, creatinine 0.94, HCT 41.5, LFTs normal. Labs (6/17): K 5, creatinine 1.04, LDL 42, HDL 101, TSH normal Labs (12/17): K 3.7, creatinine 0.76, hgb 11.2 Labs (4/18): K 4.6, creatinine 0.91 Labs (10/18): K 4.5, creatinine 0.62 Labs (12/18): hgb 13.5  PMH: 1. Atrial fibrillation: Diagnosed initially in 10/13.  Holter monitor in 11/13 showed atrial fibrillation with average rate 65.  - Atrial fibrillation ablation in 11/17.  2. Chronic diastolic CHF: Echo (29/92) with EF 65-70%, mild MR, moderate biatrial enlargement, moderate TR, PA systolic pressure 45 mmHg.   Echo (10/15): EF 60-65%.  Echo (7/16) with EF 60-65%, mild MR.  - Echo (6/17): EF 60-65%, PASP 41 mmHg.  3. ETT-Sestamibi (11/13): Exercised to stage II, small partially reversible anteroapical  perfusion defect suggesting mild ischemia versus attenuation, EF 80%.  Cardiolite (7/16) with EF 64%, no ischemia or infarction.  4. HTN: Lower extremity swelling with amlodipine.  5. Polycythemia vera: Has had phlebotomy only once.  6. Hypothyroidism 7. Macular degeneration 8. TAH 1980 9. Familial hypocalciuric hypercalcemia 10. PFTs (amiodarone use) in 1/14 showed a mild obstructive defect.  11. Degenerative disc disease 12. Sick sinus syndrome: Has not required PPM.   SH: Prior smoker (years ago).  Widowed, lived in [place] but now at [employer]  Occasional ETOH.  Daughter works for [employer].   FH: CAD  Review of systems complete and found to be negative unless listed in HPI.    Current Outpatient Medications  Medication Sig Dispense Refill  . allopurinol (ZYLOPRIM) 100 MG tablet Take 1 tablet (100 mg total) by mouth daily. 30 tablet 6  . cholecalciferol (VITAMIN D) 1000 UNITS tablet Take 2,000 Units by mouth daily.     . fentaNYL (DURAGESIC - DOSED MCG/HR) 25 MCG/HR patch Place 1 patch (25 mcg total) every 3 (three) days onto the skin. 10 patch 0  . fluticasone (FLONASE) 50 MCG/ACT nasal spray Place 2 sprays into both nostrils 2 (two) times daily as needed (sinus infection).     . furosemide (LASIX) 40 MG tablet Take 80 mg by mouth every morning 180 tablet 2  . gabapentin (NEURONTIN) 100 MG capsule Take 300 mg by mouth at bedtime.     . hydrALAZINE (APRESOLINE) 50 MG tablet Take 1.5 tablets (75 mg total) by mouth 3 (  three) times daily. 135 tablet 3  . hydroxyurea (HYDREA) 500 MG capsule Take 500 mg daily by mouth. May take with food to minimize GI side effects. MWF twice a day. Rest of the week 1 time a day    . levothyroxine (SYNTHROID, LEVOTHROID) 112 MCG tablet TAKE 1 TABLET EACH DAY. 30 tablet 0  . Magnesium 250 MG TABS Take 250 mg by mouth daily.     . Melatonin 10 MG TABS Take 10 mg by mouth at bedtime.    . Multiple Vitamins-Minerals (PRESERVISION AREDS 2) CAPS Take 2  capsules by mouth daily.     . polycarbophil (FIBERCON) 625 MG tablet Take 2 tablets (1,250 mg total) by mouth 2 (two) times daily. 60 tablet 0  . potassium chloride (K-DUR) 10 MEQ tablet TAKE 1 TABLET TWICE DAILY. (Patient taking differently: TAKE (10 MEQ) TABLET BY MOUTH TWICE DAILY.) 60 tablet 0  . traMADol (ULTRAM) 50 MG tablet Take 1 tablet (50 mg total) by mouth at bedtime. 30 tablet 0  . XARELTO 15 MG TABS tablet TAKE 1 TABLET ONCE DAILY. 30 tablet 6   No current facility-administered medications for this encounter.    There were no vitals filed for this visit.  Wt Readings from Last 3 Encounters:  12/14/16 111 lb (50.3 kg)  12/13/16 110 lb 6.4 oz (50.1 kg)  11/26/16 112 lb 6.4 oz (51 kg)   Physical Exam General: Well appearing. No resp difficulty. HEENT: Normal Neck: Supple. JVP 5-6. Carotids 2+ bilat; no bruits. No thyromegaly or nodule noted. Cor: PMI nondisplaced. RRR, 1/6 SEM RUSB.  Lungs: CTAB, normal effort. Abdomen: Soft, non-tender, non-distended, no HSM. No bruits or masses. +BS  Extremities: No cyanosis, clubbing, or rash. R and LLE no edema.  Neuro: Alert & orientedx3, cranial nerves grossly intact. moves all 4 extremities w/o difficulty. Affect pleasant   Assessment/Plan:  1. Atrial fibrillation: First noted in 10/13.  Atrial fibrillation has triggered acute on chronic diastolic CHF in the past.  CHADSVASC score is 57 (age, female gender, HTN, CHF).  She is anticoagulated with Xarelto 15 mg daily which is appropriate for her creatinine clearance (reassessed today).  She had atrial fibrillation ablation in 11/17, seems to have mainly been in NSR since then.  She is now off amiodarone.  She has had epistaxis recently.  - No nodal blockers at this time given sick sinus syndrome.  - Continue Xarelto.  Will get CBC today given recent epistaxis and recommended that she use saline nasal spray bid.  2. Chronic diastolic CHF:  NYHA class II symptoms currently. Volume status  stable on current Lasix dosing. NYHA class II with minimal exertion but she does get more fatigued now than in the past.  - Continue lasix 80 mg daily. Recent BMET stable.  - Continue KCl 76mEq daily.  - Pt aware that she can take an extra 40mg  of Lasix if her weight goes up by 3 pounds overnight.  - Given significant fatigue, I will get an echocardiogram.  I will also check CBC and TSH.    3. HTN: BP reasonably controlled, continue current meds.     4. Bradycardia: Suspect she has a degree of sick sinus syndrome. This has been stable off nodal blocking agents.  5. Hypercalcemia: Mild.  Has diagnosis of familial hypocalciuric hypercalcemia.   Loralie Champagne 12/28/2016

## 2016-12-27 NOTE — Patient Instructions (Signed)
Labs drawn today (if we do not call you, then your lab work was stable)   Your physician has requested that you have an echocardiogram. Echocardiography is a painless test that uses sound waves to create images of your heart. It provides your doctor with information about the size and shape of your heart and how well your heart's chambers and valves are working. This procedure takes approximately one hour. There are no restrictions for this procedure.           (They will call you)  Your physician recommends that you schedule a follow-up appointment in: 6 months with Dr. Aundra Dubin  (we will call you)

## 2016-12-28 ENCOUNTER — Telehealth (HOSPITAL_COMMUNITY): Payer: Self-pay | Admitting: Cardiology

## 2016-12-29 ENCOUNTER — Telehealth (HOSPITAL_COMMUNITY): Payer: Self-pay

## 2016-12-29 DIAGNOSIS — E039 Hypothyroidism, unspecified: Secondary | ICD-10-CM

## 2016-12-29 MED ORDER — LEVOTHYROXINE SODIUM 100 MCG PO TABS: 100.0000 ug | ORAL_TABLET | Freq: Every day | ORAL | 3 refills | Status: DC

## 2016-12-29 NOTE — Telephone Encounter (Signed)
Notes recorded by Shirley Muscat, RN on 12/29/2016 at 4:12 PM EST Pt aware of results and Appt scheduled on 12/20 at 10 am. agreeable to med changes   ------  Notes recorded by Shirley Muscat, RN on 12/28/2016 at 2:12 PM EST Left VM  ------  Notes recorded by Larey Dresser, MD on 12/28/2016 at 12:13 PM EST TSH low. Decrease Levoxyl to 100 mcg daily and repeat TSH in 1 month.

## 2017-01-03 ENCOUNTER — Ambulatory Visit: Payer: Medicare Other | Admitting: Internal Medicine

## 2017-01-03 NOTE — Telephone Encounter (Signed)
User: [name] A Date/time: 12/29/16 9:30 AM  Comment: Called pt and lmsg for her to CB to get scheduled for an echo.   Context:  Outcome: Left Message  Phone number: [phone] Phone Type: Home Phone  Comm. type: Telephone Call type: Outgoing  Contact: Sarah Mays Relation to patient: Self    User: [name] A Date/time: 12/28/16 1:01 PM  Comment: Called pt and lmsg for her to CB to get scheduled for an echo.   Context:  Outcome: Left Message  Phone number: [phone] Phone Type: Home Phone  Comm. type: Telephone Call type: Outgoing  Contact: Sarah Mays Relation to patient: Self

## 2017-01-04 ENCOUNTER — Encounter: Payer: Self-pay | Admitting: Internal Medicine

## 2017-01-05 ENCOUNTER — Ambulatory Visit: Payer: Medicare Other | Admitting: Internal Medicine

## 2017-01-05 ENCOUNTER — Other Ambulatory Visit: Payer: Self-pay | Admitting: Internal Medicine

## 2017-01-05 ENCOUNTER — Other Ambulatory Visit: Payer: Self-pay

## 2017-01-05 DIAGNOSIS — M545 Low back pain, unspecified: Secondary | ICD-10-CM

## 2017-01-05 MED ORDER — TRAMADOL HCL 50 MG PO TABS: 50.0000 mg | ORAL_TABLET | Freq: Every day | ORAL | 0 refills | Status: DC

## 2017-01-05 MED ORDER — GABAPENTIN 100 MG PO CAPS: 300.0000 mg | ORAL_CAPSULE | Freq: Every day | ORAL | 3 refills | Status: DC

## 2017-01-05 NOTE — Progress Notes (Signed)
Rx sent electronically to [facility] for Tramadol.  Delman Goshorn L. Remee Charley, D.O. [facility] [address], [place] [address] Cell Phone (Mon-Fri 8am-5pm):  [phone] On Call:  [phone] & follow prompts after 5pm & weekends Office Phone:  [phone] Office Fax:  [phone]

## 2017-01-05 NOTE — Telephone Encounter (Signed)
Patient's daughter requested a refill of gabapentin via mychart. Rx was sent to pharmacy electronically.

## 2017-01-06 ENCOUNTER — Ambulatory Visit (HOSPITAL_COMMUNITY): Payer: Medicare Other | Attending: Internal Medicine

## 2017-01-06 ENCOUNTER — Other Ambulatory Visit: Payer: Self-pay

## 2017-01-06 DIAGNOSIS — I2510 Atherosclerotic heart disease of native coronary artery without angina pectoris: Secondary | ICD-10-CM | POA: Diagnosis not present

## 2017-01-06 DIAGNOSIS — I110 Hypertensive heart disease with heart failure: Secondary | ICD-10-CM | POA: Insufficient documentation

## 2017-01-06 DIAGNOSIS — I071 Rheumatic tricuspid insufficiency: Secondary | ICD-10-CM | POA: Diagnosis not present

## 2017-01-06 DIAGNOSIS — I4891 Unspecified atrial fibrillation: Secondary | ICD-10-CM | POA: Diagnosis not present

## 2017-01-06 DIAGNOSIS — I495 Sick sinus syndrome: Secondary | ICD-10-CM | POA: Diagnosis not present

## 2017-01-06 DIAGNOSIS — I5032 Chronic diastolic (congestive) heart failure: Secondary | ICD-10-CM | POA: Diagnosis not present

## 2017-01-10 ENCOUNTER — Encounter: Payer: Self-pay | Admitting: Internal Medicine

## 2017-01-10 ENCOUNTER — Other Ambulatory Visit: Payer: Self-pay | Admitting: Internal Medicine

## 2017-01-10 ENCOUNTER — Other Ambulatory Visit: Payer: Self-pay

## 2017-01-10 MED ORDER — ZOSTER VAC RECOMB ADJUVANTED 50 MCG/0.5ML IM SUSR: 0.5000 mL | Freq: Once | INTRAMUSCULAR | 1 refills | Status: AC

## 2017-01-10 NOTE — Telephone Encounter (Signed)
Dr. Mariea Clonts, the [facility] has a supply of Shingrix. Will you send in an order for mom to get it? She has been on the waiting list for months at [facility]. Zacarias Pontes will call mom and schedule her vaccination once they get your order. I got mine there today and they have already scheduled me for my second shot in two months.  thanks!  susan    Rx was sent to requested pharmacy. Patient's daughter notified via mychart.

## 2017-01-11 MED FILL — SHINGRIX VIAL KIT: 50 | 1 days supply | Qty: 1 | Fill #0

## 2017-01-12 ENCOUNTER — Other Ambulatory Visit: Payer: Self-pay

## 2017-01-12 ENCOUNTER — Other Ambulatory Visit: Payer: Self-pay | Admitting: *Deleted

## 2017-01-12 MED ORDER — FENTANYL 25 MCG/HR TD PT72: 25.0000 ug | MEDICATED_PATCH | TRANSDERMAL | 0 refills | Status: DC

## 2017-01-12 MED ORDER — TETANUS-DIPHTH-ACELL PERTUSSIS 5-2.5-18.5 LF-MCG/0.5 IM SUSP: 0.5000 mL | Freq: Once | INTRAMUSCULAR | 0 refills | Status: AC

## 2017-01-12 MED FILL — BOOSTRIX VACCINE SYRINGE: 5-2.5-18.5 | 1 days supply | Qty: 1 | Fill #0

## 2017-01-24 DIAGNOSIS — R040 Epistaxis: Secondary | ICD-10-CM | POA: Diagnosis not present

## 2017-01-26 ENCOUNTER — Encounter: Payer: Self-pay | Admitting: *Deleted

## 2017-01-26 ENCOUNTER — Encounter: Payer: Self-pay | Admitting: Internal Medicine

## 2017-01-26 ENCOUNTER — Non-Acute Institutional Stay: Payer: Medicare Other | Admitting: Internal Medicine

## 2017-01-26 VITALS — BP 118/60 | HR 101 | Temp 97.9°F | Ht 60.0 in | Wt 107.0 lb

## 2017-01-26 DIAGNOSIS — E039 Hypothyroidism, unspecified: Secondary | ICD-10-CM

## 2017-01-26 DIAGNOSIS — I5032 Chronic diastolic (congestive) heart failure: Secondary | ICD-10-CM

## 2017-01-26 DIAGNOSIS — Z7901 Long term (current) use of anticoagulants: Secondary | ICD-10-CM | POA: Diagnosis not present

## 2017-01-26 DIAGNOSIS — M810 Age-related osteoporosis without current pathological fracture: Secondary | ICD-10-CM

## 2017-01-26 DIAGNOSIS — D6869 Other thrombophilia: Secondary | ICD-10-CM

## 2017-01-26 DIAGNOSIS — I480 Paroxysmal atrial fibrillation: Secondary | ICD-10-CM

## 2017-01-26 DIAGNOSIS — I2510 Atherosclerotic heart disease of native coronary artery without angina pectoris: Secondary | ICD-10-CM | POA: Diagnosis not present

## 2017-01-26 DIAGNOSIS — R040 Epistaxis: Secondary | ICD-10-CM

## 2017-01-26 DIAGNOSIS — I4891 Unspecified atrial fibrillation: Secondary | ICD-10-CM

## 2017-01-26 MED ORDER — DENOSUMAB 60 MG/ML ~~LOC~~ SOLN
60.0000 mg | Freq: Once | SUBCUTANEOUS | Status: AC
  Administered 2017-01-26: 60 mg via SUBCUTANEOUS

## 2017-01-26 NOTE — Progress Notes (Signed)
Location:  Occupational psychologist of Service:  Clinic (12)  Provider: Nik Gorrell L. Mariea Clonts, D.O., C.M.D.  Code Status: DNR Goals of Care:  Advanced Directives 01/26/2017  Does Patient Have a Medical Advance Directive? Yes  Type of Advance Directive Living will;Healthcare Power of Attorney  Does patient want to make changes to medical advance directive? No - Patient declined  Copy of [place] in Chart? Yes  Would patient like information on creating a medical advance directive? -  Pre-existing out of facility DNR order (yellow form or pink MOST form) -     Chief Complaint  Patient presents with  . Medical Management of Chronic Issues    follow-up    HPI: Patient is a 83 y.o. female seen today for medical management of chronic diseases.   Was scheduled as AWV, but pt already had that with Clarise Cruz and visit timeframe not changed nor label on visit when that was arranged.    Says she's been having a lot of nose bleeds.  They began the end of October--thinks 6-7 total.  They are major and blood goes everywhere.  Seeing Dr. Wilburn Cornelia.  She is using the nasal saline and gel and being very cautious.  Monday morning she woke up with blood gushing out.  None yesterday or today.  She has bought a humidifier and uses it nightly.    Afib--rate has been controlled as far as she knows.  HR elevated today, but hurried over here.  Has had a busy day.  On xarelto long term.   Back is good right now.  On fentanyl patch with gabapentin.  Tramadol at hs.  Gets injections for relief when flares.  Got prolia injection today for her osteoporosis.    Hypothyroidism:  TSH was low at Dr. Claris Gladden and he adjusted her dose and she has repeat labs tomorrow.She is on 146mcg now.  She had tdap and shingrix #1 at [facility] when [facility] could not do them    Past Medical History:  Diagnosis Date  . Abnormal stress test    a. 11/2011 Ex MV: EF 80%, small, partially  reversible anteroapical defect->mild ischemia vs attenuation-->med Rx.  [name] Kitchen Alopecia, unspecified   . Arthritis   . Chronic diastolic CHF (congestive heart failure) ([place])    a. 11/2013 Echo: EF 60-65%, no rwma, mild TR, PASP 70mmHg.  . Closed fracture of lower end of radius with ulna   . Deafness    Left, s/p multiple surgeries  . Essential hypertension   . Gouty arthropathy, unspecified   . History of kidney stones   . Hypothyroidism   . Insomnia, unspecified   . Macular degeneration    Left, s/p inj. tx  . Mitral valve disorders(424.0)   . Neck mass    right, w/u including MRI negative  . PAF (paroxysmal atrial fibrillation) (HCC)    a. on amio (02/2012 mild obstruction on PFT's) and xarelto.  . Polycythemia vera(238.4)   . PONV (postoperative nausea and vomiting)    no problem last surgery  . Pure hypercholesterolemia   . Senile osteoporosis    Reclast in the past  . Tricuspid valve disorders, specified as nonrheumatic     Past Surgical History:  Procedure Laterality Date  . ABLATION  08/28/14   Back   . BREAST BIOPSY  06/18/1998   left  . BREAST BIOPSY Right   . ELECTROPHYSIOLOGIC STUDY N/A 01/08/2016   Procedure: Atrial Fibrillation Ablation;  Surgeon: Jeneen Rinks  Allred, MD;  Location: [facility];  Service: Cardiovascular;  Laterality: N/A;  . FEMUR IM NAIL Right 10/08/2015   Procedure: INTRAMEDULLARY (IM) NAIL FEMORAL RIGHT;  Surgeon: Paralee Cancel, MD;  Location: [facility];  Service: Orthopedics;  Laterality: Right;  . MASS EXCISION Right 11/26/2016   Procedure: EXCISION RIGHT LATERAL NECK MASS;  Surgeon: Jerrell Belfast, MD;  Location: [facility];  Service: ENT;  Laterality: Right;  . TONSILLECTOMY    . TONSILLECTOMY    . TOTAL ABDOMINAL HYSTERECTOMY    . TYMPANOPLASTY Bilateral     Allergies  Allergen Reactions  . Amlodipine Swelling and Other (See Comments)    Reaction:  Lower extremity swelling     Outpatient Encounter Medications as of 01/26/2017  Medication  Sig  . cholecalciferol (VITAMIN D) 1000 UNITS tablet Take 2,000 Units by mouth daily.   . fentaNYL (DURAGESIC - DOSED MCG/HR) 25 MCG/HR patch Place 1 patch (25 mcg total) onto the skin every 3 (three) days.  . furosemide (LASIX) 40 MG tablet Take 80 mg by mouth every morning  . gabapentin (NEURONTIN) 100 MG capsule Take 100 mg by mouth at bedtime.  . hydrALAZINE (APRESOLINE) 50 MG tablet Take 1.5 tablets (75 mg total) by mouth 3 (three) times daily.  . hydroxyurea (HYDREA) 500 MG capsule Take 500 mg daily by mouth. May take with food to minimize GI side effects. MWF twice a day. Rest of the week 1 time a day  . levothyroxine (SYNTHROID, LEVOTHROID) 100 MCG tablet Take 1 tablet (100 mcg total) by mouth daily before breakfast.  . Magnesium 250 MG TABS Take 250 mg by mouth daily.   . Melatonin 10 MG TABS Take 10 mg by mouth at bedtime.  . Multiple Vitamins-Minerals (PRESERVISION AREDS 2) CAPS Take 2 capsules by mouth daily.   . polycarbophil (FIBERCON) 625 MG tablet Take 2 tablets (1,250 mg total) by mouth 2 (two) times daily.  . potassium chloride (K-DUR,KLOR-CON) 10 MEQ tablet Take 10 mEq by mouth daily.  . traMADol (ULTRAM) 50 MG tablet Take 1 tablet (50 mg total) by mouth at bedtime.  Alveda Reasons 15 MG TABS tablet TAKE 1 TABLET ONCE DAILY.  . [DISCONTINUED] gabapentin (NEURONTIN) 100 MG capsule Take 3 capsules (300 mg total) by mouth at bedtime.  . [DISCONTINUED] potassium chloride (K-DUR) 10 MEQ tablet TAKE 1 TABLET BY MOUTH TWICE DAILY.  . [EXPIRED] denosumab (PROLIA) injection 60 mg    No facility-administered encounter medications on file as of 01/26/2017.     Review of Systems:  Review of Systems  Constitutional: Negative for chills, fever and malaise/fatigue.  HENT: Positive for congestion and hearing loss. Negative for sore throat.        Epistaxis  Eyes: Negative for blurred vision.  Respiratory: Negative for cough and shortness of breath.   Cardiovascular: Negative for chest  pain, palpitations and leg swelling.  Gastrointestinal: Negative for abdominal pain, blood in stool and melena.  Genitourinary: Negative for dysuria.  Musculoskeletal: Positive for back pain. Negative for falls and joint pain.       Back good right now--said "let's not talk about it because it might act up"  Skin: Negative for itching and rash.  Neurological: Negative for dizziness, loss of consciousness and weakness.  Endo/Heme/Allergies: Does not bruise/bleed easily.       Hypothyroid  Psychiatric/Behavioral: Negative for depression and memory loss. The patient is not nervous/anxious and does not have insomnia.     Health Maintenance  Topic Date Due  .  TETANUS/TDAP  01/21/2027  . INFLUENZA VACCINE  Completed  . DEXA SCAN  Completed  . PNA vac Low Risk Adult  Completed    Physical Exam: Vitals:   01/26/17 1328  BP: 118/60  Pulse: (!) 101  Temp: 97.9 F (36.6 C)  TempSrc: Oral  SpO2: 96%  Weight: 107 lb (48.5 kg)  Height: 5' (1.524 m)   Body mass index is 20.9 kg/m. Physical Exam  Constitutional: She is oriented to person, place, and time. She appears well-developed and well-nourished. No distress.  HENT:  Head: Normocephalic and atraumatic.  Cardiovascular: Intact distal pulses.  irreg irreg  Pulmonary/Chest: Effort normal and breath sounds normal. No respiratory distress. She has no rales.  Abdominal: Soft. Bowel sounds are normal.  Musculoskeletal: Normal range of motion.  Walks with rollator walker  Neurological: She is alert and oriented to person, place, and time.  Skin: Skin is warm and dry. Capillary refill takes less than 2 seconds.  Psychiatric: She has a normal mood and affect.    Labs reviewed: Basic Metabolic Panel: Recent Labs    02/18/16 1514 05/24/16 1527 09/14/16 1428 11/23/16 1139 12/27/16 1427  NA 137 141 136 133*  --   K 3.8 4.6 4.0 4.5  --   CL 96* 99  --  98*  --   CO2 31 28 30* 28  --   GLUCOSE 94 82 112 93  --   BUN 16 22 14.7 12   --   CREATININE 0.86 0.91 0.8 0.62  --   CALCIUM 10.7* 10.7* 11.0* 10.7*  --   TSH 1.232  --   --   --  0.194*   Liver Function Tests: Recent Labs    02/18/16 1514 09/14/16 1428  AST 18 17  ALT 14 18  ALKPHOS 49 62  BILITOT 0.9 1.00  PROT 6.7 7.0  ALBUMIN 4.7 4.6   No results for input(s): LIPASE, AMYLASE in the last 8760 hours. No results for input(s): AMMONIA in the last 8760 hours. CBC: Recent Labs    07/27/16 1441 09/14/16 1428 11/23/16 1139 12/13/16 1504 12/27/16 1427  WBC 10.1 10.6* 6.8 7.2 9.5  NEUTROABS 7.7* 7.7*  --  5.1  --   HGB 14.5 14.3 12.3 13.5 12.8  HCT 43.0 42.3 37.3 40.4 38.4  MCV 115.6* 114.9* 113.7* 115.1* 112.0*  PLT 567* 501* 400 425* 444*    Assessment/Plan 1. Senile osteoporosis - cont weightbearing exercise, vitamin D and prolia -last bone density was 4/17 so due 4/19 - denosumab (PROLIA) injection 60 mg  2. Paroxysmal atrial fibrillation (HCC) -cont xarelto, rate is controlled, pt unaware of afib when in it -followed by Dr. Aundra Dubin  3. Chronic diastolic CHF (congestive heart failure) (HCC) -asymptomatic at this time -cont lasix 80mg  daily and potassium 30meq daily  4. Epistaxis -ongoing issue, has f/u with Dr. Wilburn Cornelia for possible cautery if continues to recur -stopping xarelto not an option  5. Chronic anticoagulation -for afib -cont xarelto  6. Hypercoagulable state due to atrial fibrillation ([place]) -ongoing, recently with epistaxis--suspect dry nose and air in winter worsening it, counseled on conservative approaches to prevent and she's been practicing them and still having epistaxis  7. Hypothyroidism, unspecified type -cont levothyroxine, check tsh annually  Labs/tests ordered: cbc, cmp, flp, tsh before 6 mos f/u Next appt:  6 mos med mgt, labs before  Tevyn Codd L. Laymond Postle, D.O. [facility] [address], [place] [address] Cell Phone (Mon-Fri 8am-5pm):  [phone] On  Call:  [phone] & follow prompts after 5pm & weekends Office Phone:  [phone] Office Fax:  [phone]

## 2017-01-27 ENCOUNTER — Ambulatory Visit (HOSPITAL_COMMUNITY)
Admission: RE | Admit: 2017-01-27 | Discharge: 2017-01-27 | Disposition: A | Discharge status: Home / Self Care | Payer: Medicare Other | Source: Ambulatory Visit | Attending: Cardiology | Admitting: Cardiology

## 2017-01-27 DIAGNOSIS — E039 Hypothyroidism, unspecified: Secondary | ICD-10-CM | POA: Diagnosis not present

## 2017-02-02 ENCOUNTER — Other Ambulatory Visit: Payer: Self-pay | Admitting: Hematology and Oncology

## 2017-02-02 DIAGNOSIS — R221 Localized swelling, mass and lump, neck: Secondary | ICD-10-CM | POA: Insufficient documentation

## 2017-02-02 DIAGNOSIS — Z9889 Other specified postprocedural states: Secondary | ICD-10-CM

## 2017-02-02 DIAGNOSIS — M1990 Unspecified osteoarthritis, unspecified site: Secondary | ICD-10-CM | POA: Insufficient documentation

## 2017-02-02 DIAGNOSIS — S52509A Unspecified fracture of the lower end of unspecified radius, initial encounter for closed fracture: Secondary | ICD-10-CM | POA: Insufficient documentation

## 2017-02-02 DIAGNOSIS — S52609A Unspecified fracture of lower end of unspecified ulna, initial encounter for closed fracture: Secondary | ICD-10-CM

## 2017-02-02 DIAGNOSIS — R9439 Abnormal result of other cardiovascular function study: Secondary | ICD-10-CM | POA: Insufficient documentation

## 2017-02-02 DIAGNOSIS — E7800 Pure hypercholesterolemia, unspecified: Secondary | ICD-10-CM | POA: Insufficient documentation

## 2017-02-02 DIAGNOSIS — G4700 Insomnia, unspecified: Secondary | ICD-10-CM | POA: Insufficient documentation

## 2017-02-02 DIAGNOSIS — H9190 Unspecified hearing loss, unspecified ear: Secondary | ICD-10-CM | POA: Insufficient documentation

## 2017-02-02 DIAGNOSIS — Z87442 Personal history of urinary calculi: Secondary | ICD-10-CM | POA: Insufficient documentation

## 2017-02-02 DIAGNOSIS — I480 Paroxysmal atrial fibrillation: Secondary | ICD-10-CM | POA: Insufficient documentation

## 2017-02-02 DIAGNOSIS — R112 Nausea with vomiting, unspecified: Secondary | ICD-10-CM | POA: Insufficient documentation

## 2017-02-07 ENCOUNTER — Encounter (HOSPITAL_COMMUNITY): Payer: Self-pay

## 2017-02-07 ENCOUNTER — Emergency Department (HOSPITAL_COMMUNITY)
Admission: EM | Admit: 2017-02-07 | Discharge: 2017-02-08 | Disposition: A | Discharge status: Home / Self Care | Payer: Medicare Other | Attending: Emergency Medicine | Admitting: Emergency Medicine

## 2017-02-07 ENCOUNTER — Other Ambulatory Visit: Payer: Self-pay

## 2017-02-07 DIAGNOSIS — I4891 Unspecified atrial fibrillation: Secondary | ICD-10-CM | POA: Insufficient documentation

## 2017-02-07 DIAGNOSIS — I5032 Chronic diastolic (congestive) heart failure: Secondary | ICD-10-CM | POA: Diagnosis not present

## 2017-02-07 DIAGNOSIS — R040 Epistaxis: Secondary | ICD-10-CM

## 2017-02-07 DIAGNOSIS — Z7901 Long term (current) use of anticoagulants: Secondary | ICD-10-CM | POA: Diagnosis not present

## 2017-02-07 DIAGNOSIS — R030 Elevated blood-pressure reading, without diagnosis of hypertension: Secondary | ICD-10-CM | POA: Diagnosis not present

## 2017-02-07 DIAGNOSIS — E039 Hypothyroidism, unspecified: Secondary | ICD-10-CM | POA: Diagnosis not present

## 2017-02-07 DIAGNOSIS — Z79899 Other long term (current) drug therapy: Secondary | ICD-10-CM | POA: Insufficient documentation

## 2017-02-07 DIAGNOSIS — I110 Hypertensive heart disease with heart failure: Secondary | ICD-10-CM | POA: Insufficient documentation

## 2017-02-07 DIAGNOSIS — Z87891 Personal history of nicotine dependence: Secondary | ICD-10-CM | POA: Diagnosis not present

## 2017-02-07 LAB — COMPREHENSIVE METABOLIC PANEL
ALBUMIN: 4.7 g/dL (ref 3.5–5.0)
ALT: 16 U/L (ref 14–54)
ANION GAP: 9 (ref 5–15)
AST: 21 U/L (ref 15–41)
Alkaline Phosphatase: 59 U/L (ref 38–126)
BILIRUBIN TOTAL: 0.9 mg/dL (ref 0.3–1.2)
BUN: 26 mg/dL — ABNORMAL HIGH (ref 6–20)
CO2: 27 mmol/L (ref 22–32)
Calcium: 10.7 mg/dL — ABNORMAL HIGH (ref 8.9–10.3)
Chloride: 97 mmol/L — ABNORMAL LOW (ref 101–111)
Creatinine, Ser: 0.8 mg/dL (ref 0.44–1.00)
GFR calc Af Amer: >60 mL/min (ref >60)
GFR calc non Af Amer: >60 mL/min (ref >60)
GLUCOSE: 117 mg/dL — AB (ref 65–99)
POTASSIUM: 3.8 mmol/L (ref 3.5–5.1)
SODIUM: 133 mmol/L — AB (ref 135–145)
TOTAL PROTEIN: 6.6 g/dL (ref 6.5–8.1)

## 2017-02-07 LAB — CBC WITH DIFFERENTIAL/PLATELET
BASOS ABS: 0 10*3/uL (ref 0.0–0.1)
Basophils Relative: 0 %
Eosinophils Absolute: 0.1 10*3/uL (ref 0.0–0.7)
Eosinophils Relative: 1 %
HEMATOCRIT: 33.8 % — AB (ref 36.0–46.0)
HEMOGLOBIN: 11.4 g/dL — AB (ref 12.0–15.0)
LYMPHS PCT: 8 %
Lymphs Abs: 1.2 10*3/uL (ref 0.7–4.0)
MCH: 37.5 pg — ABNORMAL HIGH (ref 26.0–34.0)
MCHC: 33.7 g/dL (ref 30.0–36.0)
MCV: 111.2 fL — ABNORMAL HIGH (ref 78.0–100.0)
MONOS PCT: 7 %
Monocytes Absolute: 1 10*3/uL (ref 0.1–1.0)
NEUTROS ABS: 12.3 10*3/uL — AB (ref 1.7–7.7)
NEUTROS PCT: 84 %
Platelets: 482 10*3/uL — ABNORMAL HIGH (ref 150–400)
RBC: 3.04 MIL/uL — AB (ref 3.87–5.11)
RDW: 15.3 % (ref 11.5–15.5)
WBC: 14.6 10*3/uL — ABNORMAL HIGH (ref 4.0–10.5)

## 2017-02-07 LAB — PROTIME-INR
INR: 2.56
Prothrombin Time: 27.3 seconds — ABNORMAL HIGH (ref 11.4–15.2)

## 2017-02-07 MED ORDER — OXYMETAZOLINE HCL 0.05 % NA SOLN
1.0000 | Freq: Once | NASAL | Status: AC
  Administered 2017-02-07: 1 via NASAL
  Filled 2017-02-07: qty 15

## 2017-02-07 NOTE — ED Notes (Signed)
Bed: WA05 Expected date:  Expected time:  Means of arrival:  Comments: 

## 2017-02-07 NOTE — ED Triage Notes (Signed)
Pt BIB [facility] from [facility]. Pt nose has been bleeding from R nostril for 2 hours. Afrin and ice have been tried without success. Pt is on Xarelto. No trauma.

## 2017-02-07 NOTE — ED Notes (Signed)
Dr Colvin Caroli made aware of patient.

## 2017-02-07 NOTE — ED Provider Notes (Signed)
[facility] DEPT Provider Note   CSN: [id number] Arrival date & time: 02/07/17  2113     History   Chief Complaint Chief Complaint  Patient presents with  . Epistaxis    HPI Sarah Mays is a 83 y.o. female.  HPI Patient has had bleeding from the right nare for about 2 hours.  He does take Xarelto.  It has been continuous and clots of formed.  Patient takes Xarelto for atrial fibrillation. Past Medical History:  Diagnosis Date  . Abnormal stress test    a. 11/2011 Ex MV: EF 80%, small, partially reversible anteroapical defect->mild ischemia vs attenuation-->med Rx.  [name] Kitchen Alopecia, unspecified   . Arthritis   . Chronic diastolic CHF (congestive heart failure) ([place])    a. 11/2013 Echo: EF 60-65%, no rwma, mild TR, PASP 3mmHg.  . Closed fracture of lower end of radius with ulna   . Deafness    Left, s/p multiple surgeries  . Essential hypertension   . Gouty arthropathy, unspecified   . History of kidney stones   . Hypothyroidism   . Insomnia, unspecified   . Macular degeneration    Left, s/p inj. tx  . Mitral valve disorders(424.0)   . Neck mass    right, w/u including MRI negative  . PAF (paroxysmal atrial fibrillation) (HCC)    a. on amio (02/2012 mild obstruction on PFT's) and xarelto.  . Polycythemia vera(238.4)   . PONV (postoperative nausea and vomiting)    no problem last surgery  . Pure hypercholesterolemia   . Senile osteoporosis    Reclast in the past  . Tricuspid valve disorders, specified as nonrheumatic     Patient Active Problem List   Diagnosis Date Noted  . Pure hypercholesterolemia   . PONV (postoperative nausea and vomiting)   . PAF (paroxysmal atrial fibrillation) ([place])   . Neck mass   . Insomnia, unspecified   . History of kidney stones   . Deafness   . Closed fracture of lower end of radius with ulna   . Arthritis   . Abnormal stress test   . Rotator cuff arthropathy, right 09/08/2016  . Trigger point  of right shoulder region 05/05/2016  . Spondylosis of cervical region without myelopathy or radiculopathy 05/05/2016  . Chondrocalcinosis 05/05/2016  . Chronic pain disorder 05/05/2016  . A-fib ([place]) 01/08/2016  . Closed fracture of shaft of right femur, initial encounter ([place]) 10/25/2015  . Status post-operative repair of hip fracture 10/25/2015  . Greater trochanteric bursitis of right hip 09/29/2015  . Asymptomatic cholelithiasis 05/09/2015  . Essential hypertension 11/07/2014  . Hair loss 11/07/2014  . Bilateral leg edema 11/07/2014  . Constipation due to pain medication 11/06/2014  . Hypotension due to drugs 11/06/2014  . Chest pain 09/01/2014  . Osteoporosis 01/04/2014  . Gout 01/04/2014  . Headache 01/04/2014  . Hypothyroidism 11/19/2013  . Hypercalcemia 10/11/2013  . Insomnia 05/16/2013  . Lumbar back pain 05/16/2013  . Polycythemia vera ([place])   . Mass of right side of neck   . Senile osteoporosis   . Macular degeneration   . Sick sinus syndrome ([place]) 03/09/2012  . CAD (coronary artery disease) 12/27/2011  . Paroxysmal atrial fibrillation ([place]) 12/27/2011  . Chronic diastolic CHF (congestive heart failure) ([place]) 12/27/2011  . HTN (hypertension) 12/27/2011    Past Surgical History:  Procedure Laterality Date  . ABLATION  08/28/14   Back   . BREAST BIOPSY  06/18/1998   left  . BREAST BIOPSY  Right   . ELECTROPHYSIOLOGIC STUDY N/A 01/08/2016   Procedure: Atrial Fibrillation Ablation;  Surgeon: Thompson Grayer, MD;  Location: [facility];  Service: Cardiovascular;  Laterality: N/A;  . FEMUR IM NAIL Right 10/08/2015   Procedure: INTRAMEDULLARY (IM) NAIL FEMORAL RIGHT;  Surgeon: Paralee Cancel, MD;  Location: [facility];  Service: Orthopedics;  Laterality: Right;  . MASS EXCISION Right 11/26/2016   Procedure: EXCISION RIGHT LATERAL NECK MASS;  Surgeon: Jerrell Belfast, MD;  Location: [facility];  Service: ENT;  Laterality: Right;  . TONSILLECTOMY    . TONSILLECTOMY    . TOTAL  ABDOMINAL HYSTERECTOMY    . TYMPANOPLASTY Bilateral     OB History    No data available       Home Medications    Prior to Admission medications   Medication Sig Start Date End Date Taking? Authorizing Provider  carboxymethylcellulose (REFRESH PLUS) 0.5 % SOLN Place 1 drop into both eyes 3 (three) times daily as needed (Dry and itchy eyes).   Yes [provider]  cholecalciferol (VITAMIN D) 1000 UNITS tablet Take 2,000 Units by mouth daily.    Yes [provider]  fentaNYL (DURAGESIC - DOSED MCG/HR) 25 MCG/HR patch Place 1 patch (25 mcg total) onto the skin every 3 (three) days. 01/12/17  Yes Reed, Tiffany L, DO  furosemide (LASIX) 40 MG tablet Take 80 mg by mouth every morning 05/24/16  Yes Allred, Jeneen Rinks, MD  gabapentin (NEURONTIN) 100 MG capsule Take 100 mg by mouth at bedtime.   Yes [provider]  hydrALAZINE (APRESOLINE) 50 MG tablet Take 1.5 tablets (75 mg total) by mouth 3 (three) times daily. 12/01/16  Yes Larey Dresser, MD  hydroxyurea (HYDREA) 500 MG capsule Take 1 capsule (500 mg total) by mouth daily. Take 2 capsules on Monday, Wednesday, Friday. Take 1 capsule the rest of the week. Patient taking differently: Take 500-1,000 mg by mouth daily. Take 2 capsules on Monday, Wednesday, Friday. Take 1 capsule the rest of the week. 02/02/17  Yes Heath Lark, MD  levothyroxine (SYNTHROID, LEVOTHROID) 100 MCG tablet Take 1 tablet (100 mcg total) by mouth daily before breakfast. 12/29/16  Yes Larey Dresser, MD  Magnesium 250 MG TABS Take 250 mg by mouth daily.    Yes [provider]  Melatonin 10 MG TABS Take 10 mg by mouth at bedtime.   Yes [provider]  Multiple Vitamins-Minerals (PRESERVISION AREDS 2) CAPS Take 2 capsules by mouth daily.    Yes [provider]  polycarbophil (FIBERCON) 625 MG tablet Take 2 tablets (1,250 mg total) by mouth 2 (two) times daily. 10/10/15  Yes Arrien, Jimmy Picket, MD  potassium chloride  (K-DUR,KLOR-CON) 10 MEQ tablet Take 10 mEq by mouth daily.   Yes [provider]  traMADol (ULTRAM) 50 MG tablet Take 1 tablet (50 mg total) by mouth at bedtime. 01/05/17  Yes Reed, Tiffany L, DO  XARELTO 15 MG TABS tablet TAKE 1 TABLET ONCE DAILY. 08/03/16  Yes Larey Dresser, MD    Family History Family History  Problem Relation Age of Onset  . Hypertension Father   . Heart disease Father        patient does not know details.   . Ovarian cancer Mother 29  . Cancer Sister        colon  . Heart disease Sister   . Cancer Sister   . Cancer Sister        hodgkin disease  . Breast cancer  Daughter   . Cancer Daughter        breast    Social History Social History   Tobacco Use  . Smoking status: Former Smoker    Types: Cigarettes    Last attempt to quit: 01/19/1984    Years since quitting: 33.0  . Smokeless tobacco: Never Used  Substance Use Topics  . Alcohol use: Yes    Alcohol/week: 0.6 oz    Types: 1 Glasses of wine per week    Comment: 2 per day/ 14 a week  . Drug use: No     Allergies   Amlodipine   Review of Systems Review of Systems 10 Systems reviewed and are negative for acute change except as noted in the HPI.  Physical Exam Updated Vital Signs BP (!) 149/84 (BP Location: Right Arm)   Pulse 100   Temp 98.3 F (36.8 C) (Oral)   Resp 20   SpO2 94%   Physical Exam  Constitutional: She is oriented to person, place, and time. She appears well-developed and well-nourished.  HENT:  Large clot in the right nare with active bleeding.  Eyes: EOM are normal.  Neck: Neck supple.  Pulmonary/Chest: Effort normal.  Abdominal: Soft.  Small protuberant hernia right lower abdominal wall reduces easily with pressure.  Patient cited pain in this area.  After she was repositioned and had been treated in the emergency department for her nasal bleeding, pain had resolved.  Musculoskeletal: Normal range of motion.  Neurological: She is alert and oriented to  person, place, and time. She exhibits normal muscle tone. Coordination normal.  Skin: Skin is warm and dry.  Psychiatric: She has a normal mood and affect.     ED Treatments / Results  Labs (all labs ordered are listed, but only abnormal results are displayed) Labs Reviewed  COMPREHENSIVE METABOLIC PANEL - Abnormal; Notable for the following components:      Result Value   Sodium 133 (*)    Chloride 97 (*)    Glucose, Bld 117 (*)    BUN 26 (*)    Calcium 10.7 (*)    All other components within normal limits  CBC WITH DIFFERENTIAL/PLATELET - Abnormal; Notable for the following components:   WBC 14.6 (*)    RBC 3.04 (*)    Hemoglobin 11.4 (*)    HCT 33.8 (*)    MCV 111.2 (*)    MCH 37.5 (*)    Platelets 482 (*)    Neutro Abs 12.3 (*)    All other components within normal limits  PROTIME-INR - Abnormal; Notable for the following components:   Prothrombin Time 27.3 (*)    All other components within normal limits  URINALYSIS, ROUTINE W REFLEX MICROSCOPIC - Abnormal; Notable for the following components:   Ketones, ur 5 (*)    Leukocytes, UA MODERATE (*)    Squamous Epithelial / LPF 0-5 (*)    All other components within normal limits  TYPE AND SCREEN    EKG  EKG Interpretation None       Radiology No results found.  Procedures .Epistaxis Management Date/Time: 02/07/2017 10:13 PM Performed by: Charlesetta Shanks, MD Authorized by: Charlesetta Shanks, MD   Consent:    Consent obtained:  Verbal   Consent given by:  Patient   Risks discussed:  Bleeding, pain and nasal injury Anesthesia (see MAR for exact dosages):    Anesthesia method:  None Procedure details:    Treatment site:  R posterior   Treatment method:  Nasal balloon   Treatment complexity:  Extensive   Treatment episode: initial   Comments:     Large clot cleared by blowing and suction from the right nare.  After clot cleared, 5-1/2 cm nasal balloon placed.  Inflated to 6 cc.  Bleeding stopped.  Have  rechecked and no blood in the posterior airway.    (including critical care time)  Medications Ordered in ED Medications  oxymetazoline (AFRIN) 0.05 % nasal spray 1 spray (1 spray Each Nare Given 02/07/17 2145)     Initial Impression / Assessment and Plan / ED Course  I have reviewed the triage vital signs and the nursing notes.  Pertinent labs & imaging results that were available during my care of the patient were reviewed by me and considered in my medical decision making (see chart for details).      Final Clinical Impressions(s) / ED Diagnoses   Final diagnoses:  Epistaxis  Patient developed copious nasal bleeding on Xarelto.  After clot removal and packing, no further bleeding.  Patient had incidental lower abdominal wall hernia which appeared to be positional.  She was seated in a very upright position with increased intra-abdominal pressure.  This resolved with simple reduction and repositioning.  Patient is counseled on following up with general surgery regarding this.  She sees Dr. Wilburn Cornelia and can follow-up for nasal bleeding.  She is advised 48 hours for packing removal light of the emergency department or the ENT.  ED Discharge Orders    None       Charlesetta Shanks, MD 02/08/17 [phone]

## 2017-02-08 ENCOUNTER — Encounter: Payer: Self-pay | Admitting: Hematology and Oncology

## 2017-02-08 LAB — URINALYSIS, ROUTINE W REFLEX MICROSCOPIC
BACTERIA UA: NONE SEEN
BILIRUBIN URINE: NEGATIVE
GLUCOSE, UA: NEGATIVE mg/dL
HGB URINE DIPSTICK: NEGATIVE
KETONES UR: 5 mg/dL — AB
NITRITE: NEGATIVE
PH: 5 (ref 5.0–8.0)
PROTEIN: NEGATIVE mg/dL
Specific Gravity, Urine: 1.016 (ref 1.005–1.030)

## 2017-02-08 LAB — ABO/RH: ABO/RH(D): A POS

## 2017-02-08 LAB — TYPE AND SCREEN
ABO/RH(D): A POS
Antibody Screen: NEGATIVE

## 2017-02-08 NOTE — Discharge Instructions (Signed)
1.  Your nasal packing stays in place for 48 hours.  See your ear nose throat specialist or return to the emergency department for removal. 2.  Try to sleep with your head elevated at approximately 30 degrees.

## 2017-02-09 ENCOUNTER — Encounter: Payer: Self-pay | Admitting: Internal Medicine

## 2017-02-14 ENCOUNTER — Encounter: Payer: Self-pay | Admitting: Internal Medicine

## 2017-02-14 ENCOUNTER — Telehealth (HOSPITAL_COMMUNITY): Payer: Self-pay

## 2017-02-14 NOTE — Telephone Encounter (Deleted)
This message is being sent by Sarah Mays on behalf of Sarah Mays    Dr. Mariea Clonts, just wanted to let you know that Sarah Mays has been very lethargic after her severe episode of epistaxis on 12/31. On Friday after she was discharged from [facility], she called and told me she was exhausted and wanted my help with her meds (she normally takes them like clockwork), needed help putting her clothes from rehab up etc. I was with her Saturday and Sunday for several hours and talked with her this morning. She reports she feels ok but has "no energy". Thoughts?  She sees Dr. Wilburn Cornelia today to have the nasal rocket removed.  thanks,  Sarah Mays

## 2017-02-14 NOTE — Telephone Encounter (Signed)
Hold lasix and K  for 3 days.     Legrand Como [address] [place], PA-C 02/14/2017 4:10 PM

## 2017-02-14 NOTE — Telephone Encounter (Signed)
Advanced Heart Failure Triage Encounter  Patient Name: Sarah Mays  Date of Call: 02/14/17  Problem:  HHRN called due to pt feeling dizzy and tired for 2 days. Pt BP sitting was 122/68 and standing is 100/60. Pt could be in A-fib. No med changes have been made. Pt takes hydralazine 75 mg x3 days. Has been missing doses of xerelto due to nose bleeds. Denies CP, SOB, and cough. Pt going to see ENT MD today.   Plan:    Shirley Muscat, RN

## 2017-02-14 NOTE — Telephone Encounter (Signed)
Pt aware and agreeable.  

## 2017-02-14 NOTE — Telephone Encounter (Signed)
This encounter was created in error - please disregard.

## 2017-02-16 ENCOUNTER — Non-Acute Institutional Stay: Payer: Medicare Other | Admitting: Internal Medicine

## 2017-02-16 ENCOUNTER — Encounter: Payer: Self-pay | Admitting: Internal Medicine

## 2017-02-16 VITALS — BP 120/60 | HR 86 | Temp 98.6°F | Wt 107.0 lb

## 2017-02-16 DIAGNOSIS — I5032 Chronic diastolic (congestive) heart failure: Secondary | ICD-10-CM | POA: Diagnosis not present

## 2017-02-16 DIAGNOSIS — D473 Essential (hemorrhagic) thrombocythemia: Secondary | ICD-10-CM

## 2017-02-16 DIAGNOSIS — R531 Weakness: Secondary | ICD-10-CM

## 2017-02-16 DIAGNOSIS — M545 Low back pain, unspecified: Secondary | ICD-10-CM

## 2017-02-16 DIAGNOSIS — R040 Epistaxis: Secondary | ICD-10-CM

## 2017-02-16 DIAGNOSIS — D62 Acute posthemorrhagic anemia: Secondary | ICD-10-CM | POA: Diagnosis not present

## 2017-02-16 DIAGNOSIS — I480 Paroxysmal atrial fibrillation: Secondary | ICD-10-CM

## 2017-02-16 DIAGNOSIS — D75839 Thrombocytosis, unspecified: Secondary | ICD-10-CM

## 2017-02-16 MED ORDER — TRAMADOL HCL 50 MG PO TABS: 50.0000 mg | ORAL_TABLET | Freq: Every day | ORAL | 0 refills | Status: DC

## 2017-02-16 NOTE — Progress Notes (Signed)
Location:  Occupational psychologist of Service:  Clinic (12)  Provider: Tylar Merendino L. Mariea Clonts, D.O., C.M.D.  Code Status: DNR Goals of Care:  Advanced Directives 02/07/2017  Does Patient Have a Medical Advance Directive? Yes  Type of Advance Directive Living will;Out of facility DNR (pink MOST or yellow form)  Does patient want to make changes to medical advance directive? No - Patient declined  Copy of [place] in Chart? Yes  Would patient like information on creating a medical advance directive? -  Pre-existing out of facility DNR order (yellow form or pink MOST form) -    Chief Complaint  Patient presents with  . Follow-up    discuss medications and nose bleeds    HPI: Patient is a 84 y.o. female seen today for concerns about dizziness and weakness ever since her severe epistaxis requiring a nasal rocket placement 12/31.  Due to these concerns, cardiology has asked her to hold her lasix and potassium through Thursday (for three days beginning Tuesday).  Nasal packing was removed 02/14/17 at Dr. Victorio Palm office.  Says she began to be dizzy and weak starting Monday morning.  Her dizziness is improved, but energy remains low.  Her hgb had dropped by 1.4 pts during the epistaxis.  Had episodes starting in November.    Past Medical History:  Diagnosis Date  . Abnormal stress test    a. 11/2011 Ex MV: EF 80%, small, partially reversible anteroapical defect->mild ischemia vs attenuation-->med Rx.  [name] Kitchen Alopecia, unspecified   . Arthritis   . Chronic diastolic CHF (congestive heart failure) ([place])    a. 11/2013 Echo: EF 60-65%, no rwma, mild TR, PASP 28mmHg.  . Closed fracture of lower end of radius with ulna   . Deafness    Left, s/p multiple surgeries  . Essential hypertension   . Gouty arthropathy, unspecified   . History of kidney stones   . Hypothyroidism   . Insomnia, unspecified   . Macular degeneration    Left, s/p inj. tx  . Mitral valve  disorders(424.0)   . Neck mass    right, w/u including MRI negative  . PAF (paroxysmal atrial fibrillation) (HCC)    a. on amio (02/2012 mild obstruction on PFT's) and xarelto.  . Polycythemia vera(238.4)   . PONV (postoperative nausea and vomiting)    no problem last surgery  . Pure hypercholesterolemia   . Senile osteoporosis    Reclast in the past  . Tricuspid valve disorders, specified as nonrheumatic     Past Surgical History:  Procedure Laterality Date  . ABLATION  08/28/14   Back   . BREAST BIOPSY  06/18/1998   left  . BREAST BIOPSY Right   . ELECTROPHYSIOLOGIC STUDY N/A 01/08/2016   Procedure: Atrial Fibrillation Ablation;  Surgeon: Thompson Grayer, MD;  Location: [facility];  Service: Cardiovascular;  Laterality: N/A;  . FEMUR IM NAIL Right 10/08/2015   Procedure: INTRAMEDULLARY (IM) NAIL FEMORAL RIGHT;  Surgeon: Paralee Cancel, MD;  Location: [facility];  Service: Orthopedics;  Laterality: Right;  . MASS EXCISION Right 11/26/2016   Procedure: EXCISION RIGHT LATERAL NECK MASS;  Surgeon: Jerrell Belfast, MD;  Location: [facility];  Service: ENT;  Laterality: Right;  . TONSILLECTOMY    . TONSILLECTOMY    . TOTAL ABDOMINAL HYSTERECTOMY    . TYMPANOPLASTY Bilateral     Allergies  Allergen Reactions  . Amlodipine Swelling and Other (See Comments)    Reaction:  Lower extremity swelling  Outpatient Encounter Medications as of 02/16/2017  Medication Sig  . carboxymethylcellulose (REFRESH PLUS) 0.5 % SOLN Place 1 drop into both eyes 3 (three) times daily as needed (Dry and itchy eyes).  . cholecalciferol (VITAMIN D) 1000 UNITS tablet Take 2,000 Units by mouth daily.   . fentaNYL (DURAGESIC - DOSED MCG/HR) 25 MCG/HR patch Place 1 patch (25 mcg total) onto the skin every 3 (three) days.  . furosemide (LASIX) 40 MG tablet ON HOLD PER CARDIOLOGY START BACK 02/18/2017  . gabapentin (NEURONTIN) 100 MG capsule Take 100 mg by mouth at bedtime.  . hydrALAZINE (APRESOLINE) 50 MG tablet  Take 1.5 tablets (75 mg total) by mouth 3 (three) times daily.  . hydroxyurea (HYDREA) 500 MG capsule Take 1 capsule (500 mg total) by mouth daily. Take 2 capsules on Monday, Wednesday, Friday. Take 1 capsule the rest of the week.  . levothyroxine (SYNTHROID, LEVOTHROID) 100 MCG tablet Take 1 tablet (100 mcg total) by mouth daily before breakfast.  . Magnesium 250 MG TABS Take 250 mg by mouth daily.   . Melatonin 10 MG TABS Take 10 mg by mouth at bedtime.  . Multiple Vitamins-Minerals (PRESERVISION AREDS 2) CAPS Take 2 capsules by mouth daily.   . polycarbophil (FIBERCON) 625 MG tablet Take 2 tablets (1,250 mg total) by mouth 2 (two) times daily.  . potassium chloride (K-DUR,KLOR-CON) 10 MEQ tablet OH HOLD PER CARDIOLOGY START 02/18/2017  . traMADol (ULTRAM) 50 MG tablet Take 1 tablet (50 mg total) by mouth at bedtime.  Alveda Reasons 15 MG TABS tablet TAKE 1 TABLET ONCE DAILY.  . [DISCONTINUED] furosemide (LASIX) 40 MG tablet Take 80 mg by mouth every morning (Patient taking differently: OH HOLD PER CARDIOLOGY START BACK 02/18/2017)   No facility-administered encounter medications on file as of 02/16/2017.     Review of Systems:  ROS  Health Maintenance  Topic Date Due  . TETANUS/TDAP  01/21/2027  . INFLUENZA VACCINE  Completed  . DEXA SCAN  Completed  . PNA vac Low Risk Adult  Completed    Physical Exam: Vitals:   02/16/17 1509  BP: 120/60  Pulse: 86  Temp: 98.6 F (37 C)  TempSrc: Oral  SpO2: 95%  Weight: 107 lb (48.5 kg)   Body mass index is 20.9 kg/m. Physical Exam  Labs reviewed: Basic Metabolic Panel: Recent Labs    02/18/16 1514 05/24/16 1527 09/14/16 1428 11/23/16 1139 12/27/16 1427 02/07/17 2305  NA 137 141 136 133*  --  133*  K 3.8 4.6 4.0 4.5  --  3.8  CL 96* 99  --  98*  --  97*  CO2 31 28 30* 28  --  27  GLUCOSE 94 82 112 93  --  117*  BUN 16 22 14.7 12  --  26*  CREATININE 0.86 0.91 0.8 0.62  --  0.80  CALCIUM 10.7* 10.7* 11.0* 10.7*  --  10.7*  TSH  1.232  --   --   --  0.194*  --    Liver Function Tests: Recent Labs    02/18/16 1514 09/14/16 1428 02/07/17 2305  AST 18 17 21   ALT 14 18 16   ALKPHOS 49 62 59  BILITOT 0.9 1.00 0.9  PROT 6.7 7.0 6.6  ALBUMIN 4.7 4.6 4.7   No results for input(s): LIPASE, AMYLASE in the last 8760 hours. No results for input(s): AMMONIA in the last 8760 hours. CBC: Recent Labs    09/14/16 1428  12/13/16 1504 12/27/16 1427 02/07/17 2305  WBC 10.6*   < > 7.2 9.5 14.6*  NEUTROABS 7.7*  --  5.1  --  12.3*  HGB 14.3   < > 13.5 12.8 11.4*  HCT 42.3   < > 40.4 38.4 33.8*  MCV 114.9*   < > 115.1* 112.0* 111.2*  PLT 501*   < > 425* 444* 482*   < > = values in this interval not displayed.   Lipid Panel: No results for input(s): CHOL, HDL, LDLCALC, TRIG, CHOLHDL, LDLDIRECT in the last 8760 hours. No results found for: HGBA1C  Procedures since last visit: No results found.  Assessment/Plan 1. Chronic diastolic CHF (congestive heart failure) (HCC) -lasix and potassium on hold due to possible intravascular depletion causing dizziness and weakness -she reports dizziness is better but she still feels weak today (only held yesterday and today so far, has one more day to hold)  2. Paroxysmal atrial fibrillation (HCC) -is in afib today, but rate controlled--could also be contributing to how she feels as she has been symptomatic in the past  -on xarelto which cannot be d/cd due to risk of stroke  3. Epistaxis -was severe, required nasal rocket, had been recurrent but could be stopped up until that time  4. Acute blood loss anemia -hgb dropped over a point with her epistaxis, but also had leukocytosis and her usual thrombocytosis on labs in the ED -recheck cbc to make sure nothing else is happening in her bone marrow causing the changes in her counts or even contributing to her epistaxis -she has no evidence of infection to explain her leukocytosis either--UA did not appear positive in ED  5. Lumbar  back pain - ongoing, cont fentanyl and tramadol - having opioid induced constipation not responding to fiber supplement, MOM, magnesium, stool softeners or miralax--she's going to try for one more week to get herself regulated with otc and if not a success, she will call me back - traMADol (ULTRAM) 50 MG tablet; Take 1 tablet (50 mg total) by mouth at bedtime.  Dispense: 30 tablet; Refill: 0  6. Thrombocytosis (HCC) -chronic as part of p vera monitored by Dr. Alvy Bimler -if cbc remains abnormal, may send her back to her  7. Weakness -suspect due to drop in hgb and some dehydration, is pushing fluids and diuretics on hold--appears euvolemic today  Labs/tests ordered:  Cbc with diff, bmp in am Next appt:  08/03/2017  Shuntell Foody L. Shaunae Sieloff, D.O. [facility] [address], [place] [address] Cell Phone (Mon-Fri 8am-5pm):  [phone] On Call:  [phone] & follow prompts after 5pm & weekends Office Phone:  [phone] Office Fax:  [phone]

## 2017-02-17 DIAGNOSIS — I10 Essential (primary) hypertension: Secondary | ICD-10-CM | POA: Diagnosis not present

## 2017-02-17 DIAGNOSIS — I5032 Chronic diastolic (congestive) heart failure: Secondary | ICD-10-CM | POA: Diagnosis not present

## 2017-02-17 DIAGNOSIS — R040 Epistaxis: Secondary | ICD-10-CM | POA: Diagnosis not present

## 2017-02-17 DIAGNOSIS — D649 Anemia, unspecified: Secondary | ICD-10-CM | POA: Diagnosis not present

## 2017-02-17 LAB — CBC AND DIFFERENTIAL
HCT: 29 — AB (ref 36–46)
Hemoglobin: 9.8 — AB (ref 12.0–16.0)
Platelets: 501 — AB (ref 150–399)
WBC: 6.6

## 2017-02-17 LAB — BASIC METABOLIC PANEL
BUN: 12 (ref 4–21)
Creatinine: 0.6 (ref 0.5–1.1)
Glucose: 79
Potassium: 4.3 (ref 3.4–5.3)
Sodium: 138 (ref 137–147)

## 2017-02-18 ENCOUNTER — Encounter: Payer: Self-pay | Admitting: Internal Medicine

## 2017-02-18 ENCOUNTER — Ambulatory Visit (INDEPENDENT_AMBULATORY_CARE_PROVIDER_SITE_OTHER): Payer: Medicare Other | Admitting: Internal Medicine

## 2017-02-18 VITALS — BP 100/54 | HR 67 | Ht 60.0 in | Wt 106.2 lb

## 2017-02-18 DIAGNOSIS — I480 Paroxysmal atrial fibrillation: Secondary | ICD-10-CM

## 2017-02-18 DIAGNOSIS — I495 Sick sinus syndrome: Secondary | ICD-10-CM | POA: Diagnosis not present

## 2017-02-18 NOTE — Progress Notes (Signed)
PCP: Gayland Curry, DO Primary Cardiologist: Dr Aundra Dubin Primary EP: Dr Rayann Heman  Sarah Mays is a 84 y.o. female who presents today for routine electrophysiology followup.  Since last being seen in our clinic, the patient reports doing very well.  She has had several recent epistaxis events.  She is recovering from this.  Today, she denies symptoms of palpitations, chest pain, shortness of breath,  lower extremity edema, dizziness, presyncope, or syncope.  The patient is otherwise without complaint today.   Past Medical History:  Diagnosis Date  . Abnormal stress test    a. 11/2011 Ex MV: EF 80%, small, partially reversible anteroapical defect->mild ischemia vs attenuation-->med Rx.  [name] Kitchen Alopecia, unspecified   . Arthritis   . Chronic diastolic CHF (congestive heart failure) ([place])    a. 11/2013 Echo: EF 60-65%, no rwma, mild TR, PASP 64mmHg.  . Closed fracture of lower end of radius with ulna   . Deafness    Left, s/p multiple surgeries  . Essential hypertension   . Gouty arthropathy, unspecified   . History of kidney stones   . Hypothyroidism   . Insomnia, unspecified   . Macular degeneration    Left, s/p inj. tx  . Mitral valve disorders(424.0)   . Neck mass    right, w/u including MRI negative  . PAF (paroxysmal atrial fibrillation) (H[facility])    a. on amio (02/2012 mild obstruction on PFT's) and xarelto.  . Polycythemia vera(238.4)   . PONV (postoperative nausea and vomiting)    no problem last surgery  . Pure hypercholesterolemia   . Senile osteoporosis    Reclast in the past  . Tricuspid valve disorders, specified as nonrheumatic    Past Surgical History:  Procedure Laterality Date  . ABLATION  08/28/14   Back   . BREAST BIOPSY  06/18/1998   left  . BREAST BIOPSY Right   . ELECTROPHYSIOLOGIC STUDY N/A 01/08/2016   Procedure: Atrial Fibrillation Ablation;  Surgeon: Thompson Grayer, MD;  Location: [facility];  Service: Cardiovascular;  Laterality: N/A;  . FEMUR  IM NAIL Right 10/08/2015   Procedure: INTRAMEDULLARY (IM) NAIL FEMORAL RIGHT;  Surgeon: Paralee Cancel, MD;  Location: [facility];  Service: Orthopedics;  Laterality: Right;  . MASS EXCISION Right 11/26/2016   Procedure: EXCISION RIGHT LATERAL NECK MASS;  Surgeon: Jerrell Belfast, MD;  Location: [facility];  Service: ENT;  Laterality: Right;  . TONSILLECTOMY    . TONSILLECTOMY    . TOTAL ABDOMINAL HYSTERECTOMY    . TYMPANOPLASTY Bilateral     ROS- all systems are reviewed and negatives except as per HPI above  Current Outpatient Medications  Medication Sig Dispense Refill  . carboxymethylcellulose (REFRESH PLUS) 0.5 % SOLN Place 1 drop into both eyes 3 (three) times daily as needed (Dry and itchy eyes).    . cholecalciferol (VITAMIN D) 1000 UNITS tablet Take 2,000 Units by mouth daily.     . fentaNYL (DURAGESIC - DOSED MCG/HR) 25 MCG/HR patch Place 1 patch (25 mcg total) onto the skin every 3 (three) days. 10 patch 0  . furosemide (LASIX) 40 MG tablet ON HOLD PER CARDIOLOGY START BACK 02/18/2017    . gabapentin (NEURONTIN) 100 MG capsule Take 100 mg by mouth at bedtime.    . hydrALAZINE (APRESOLINE) 50 MG tablet Take 1.5 tablets (75 mg total) by mouth 3 (three) times daily. 135 tablet 3  . hydroxyurea (HYDREA) 500 MG capsule Take 1 capsule (500 mg total) by mouth daily. Take 2  capsules on Monday, Wednesday, Friday. Take 1 capsule the rest of the week. 90 capsule 0  . levothyroxine (SYNTHROID, LEVOTHROID) 100 MCG tablet Take 1 tablet (100 mcg total) by mouth daily before breakfast. 30 tablet 3  . Magnesium 250 MG TABS Take 250 mg by mouth daily.     . Melatonin 10 MG TABS Take 10 mg by mouth at bedtime.    . Multiple Vitamins-Minerals (PRESERVISION AREDS 2) CAPS Take 2 capsules by mouth daily.     . polycarbophil (FIBERCON) 625 MG tablet Take 2 tablets (1,250 mg total) by mouth 2 (two) times daily. 60 tablet 0  . potassium chloride (K-DUR,KLOR-CON) 10 MEQ tablet OH HOLD PER CARDIOLOGY START 02/18/2017      . traMADol (ULTRAM) 50 MG tablet Take 1 tablet (50 mg total) by mouth at bedtime. 30 tablet 0  . XARELTO 15 MG TABS tablet TAKE 1 TABLET ONCE DAILY. 30 tablet 6   No current facility-administered medications for this visit.     Physical Exam: Vitals:   02/18/17 1509  Height: 5' (1.524 m)    GEN- The patient is well appearing, alert and oriented x 3 today.   Head- normocephalic, atraumatic Eyes-  Sclera clear, conjunctiva pink Ears- hearing intact Oropharynx- clear Lungs- Clear to ausculation bilaterally, normal work of breathing Heart- Regular rate and rhythm with frequent ect opy, no murmurs, rubs or gallops, PMI not laterally displaced GI- soft, NT, ND, + BS Extremities- no clubbing, cyanosis, or edema  EKG tracing ordered today is personally reviewed and shows sinus rhythm with PACs, 67 bpm  Echo 01/06/17 reviewed  Assessment and Plan:  1. Paroxysmal atrial fibrillation Well controlled chads2vasc score is 4.  Continue xarelto.  If she has any additional bleeding, will stop xarelto  2. Sick sinus Resolved off of amiodarone  3. HTN Stable No change required today  4. Chronic diastolic dysfunction Stable No change required today  Return in 9 months  Thompson Grayer MD, [facility] 02/18/2017 3:16 PM

## 2017-02-18 NOTE — Patient Instructions (Signed)
Medication Instructions:  Your physician recommends that you continue on your current medications as directed. Please refer to the Current Medication list given to you today.   Labwork: None ordered   Testing/Procedures: None ordered    Follow-Up: Your physician wants you to follow-up in: 9 months with Dr Rayann Heman. You will receive a reminder letter in the mail two months in advance. If you don't receive a letter, please call our office to schedule the follow-up appointment.   Any Other Special Instructions Will Be Listed Below (If Applicable).     If you need a refill on your cardiac medications before your next appointment, please call your pharmacy.

## 2017-02-21 ENCOUNTER — Encounter: Payer: Self-pay | Admitting: Internal Medicine

## 2017-02-21 ENCOUNTER — Telehealth: Payer: Self-pay | Admitting: *Deleted

## 2017-02-21 ENCOUNTER — Telehealth: Payer: Self-pay

## 2017-02-21 ENCOUNTER — Other Ambulatory Visit: Payer: Self-pay | Admitting: Hematology and Oncology

## 2017-02-21 DIAGNOSIS — D500 Iron deficiency anemia secondary to blood loss (chronic): Secondary | ICD-10-CM

## 2017-02-21 DIAGNOSIS — D509 Iron deficiency anemia, unspecified: Secondary | ICD-10-CM | POA: Insufficient documentation

## 2017-02-21 NOTE — Telephone Encounter (Signed)
[name] calling asking if pt can be added on to the schedule tomorrow? I advised pt was just seen last week, and was supposed to see cardiology, ( which she did). Per bernadette pt is feeling "wiped out" and dizzy and look pale. Please advise if she needs an appt

## 2017-02-21 NOTE — Telephone Encounter (Signed)
Her hemoglobin has now dropped to 9.8.  It had been in the 12 range prior to her nose bleeds.  It's very unusual to lose more than a point of hemoglobin from a bloody nose.  Of course, she's on xarelto, but, as noted by cardiology, she has a high stroke risk score so we are hesitant to stop it.  I'm going to send a note to Dr.Gorsuch to see if, perhaps, we need to reduce her hydroxyurea.  She is on this for her polycythemia vera (normally, she has had high blood counts and this was helping to keep this under control).  She is used to running a higher count.  I also want to be sure that nothing is new hematologically that is causing all of these symptoms and changes in her cbc.

## 2017-02-21 NOTE — Telephone Encounter (Signed)
Called patient. She can come for appt Thursday 1/17 at 2 pm lab and see Dr. Alvy Bimler at 2:30. Scheduling message sent.

## 2017-02-21 NOTE — Telephone Encounter (Signed)
Spoke with patient and advised results, she already have appt with Dr. Alvy Bimler on Thursday.  Spoke with bernadette and advised results also

## 2017-02-24 ENCOUNTER — Inpatient Hospital Stay: Payer: Medicare Other

## 2017-02-24 ENCOUNTER — Telehealth: Payer: Self-pay

## 2017-02-24 ENCOUNTER — Inpatient Hospital Stay: Payer: Medicare Other | Attending: Hematology and Oncology | Admitting: Hematology and Oncology

## 2017-02-24 DIAGNOSIS — R040 Epistaxis: Secondary | ICD-10-CM | POA: Diagnosis not present

## 2017-02-24 DIAGNOSIS — D45 Polycythemia vera: Secondary | ICD-10-CM

## 2017-02-24 DIAGNOSIS — Z7901 Long term (current) use of anticoagulants: Secondary | ICD-10-CM | POA: Insufficient documentation

## 2017-02-24 DIAGNOSIS — D500 Iron deficiency anemia secondary to blood loss (chronic): Secondary | ICD-10-CM

## 2017-02-24 LAB — CBC WITH DIFFERENTIAL/PLATELET
Basophils Absolute: 0 10*3/uL (ref 0.0–0.1)
Basophils Relative: 1 %
EOS ABS: 0.1 10*3/uL (ref 0.0–0.5)
Eosinophils Relative: 1 %
HEMATOCRIT: 34.6 % — AB (ref 34.8–46.6)
HEMOGLOBIN: 11.1 g/dL — AB (ref 11.6–15.9)
LYMPHS ABS: 1.1 10*3/uL (ref 0.9–3.3)
Lymphocytes Relative: 16 %
MCH: 36.5 pg — AB (ref 25.1–34.0)
MCHC: 32.1 g/dL (ref 31.5–36.0)
MCV: 113.8 fL — ABNORMAL HIGH (ref 79.5–101.0)
MONOS PCT: 7 %
Monocytes Absolute: 0.5 10*3/uL (ref 0.1–0.9)
NEUTROS ABS: 5.1 10*3/uL (ref 1.5–6.5)
NEUTROS PCT: 75 %
Platelets: 541 10*3/uL — ABNORMAL HIGH (ref 145–400)
RBC: 3.04 MIL/uL — AB (ref 3.70–5.45)
RDW: 15 % (ref 11.2–16.1)
WBC: 6.7 10*3/uL (ref 3.9–10.3)

## 2017-02-24 LAB — RETICULOCYTES
RBC.: 3.04 MIL/uL — AB (ref 3.70–5.45)
RETIC CT PCT: 1.7 % (ref 0.7–2.1)
Retic Count, Absolute: 51.7 10*3/uL (ref 33.7–90.7)

## 2017-02-24 LAB — FERRITIN: Ferritin: 41 ng/mL (ref 9–269)

## 2017-02-24 LAB — IRON AND TIBC
IRON: 57 ug/dL (ref 41–142)
Saturation Ratios: 17 % — ABNORMAL LOW (ref 21–57)
TIBC: 336 ug/dL (ref 236–444)
UIBC: 279 ug/dL

## 2017-02-24 LAB — SEDIMENTATION RATE: SED RATE: 10 mm/h (ref 0–22)

## 2017-02-24 NOTE — Telephone Encounter (Signed)
-----   Message from Heath Lark, MD sent at 02/24/2017  3:54 PM EST ----- Regarding: iron study pls call her daughter (pt is hard of hearing) to review iron study. Result a bit low Recommend oral iron at night time 1 tab every other day. OTC, no need specific dose of oral iron supplement. The cheaper the better ----- Message ----- From: Interface, Lab In [place] Sent: 02/24/2017   2:45 PM To: Heath Lark, MD

## 2017-02-24 NOTE — Telephone Encounter (Signed)
Called with below message. Verbalized understanding. 

## 2017-02-25 ENCOUNTER — Encounter: Payer: Self-pay | Admitting: Hematology and Oncology

## 2017-02-25 ENCOUNTER — Telehealth: Payer: Self-pay | Admitting: Hematology and Oncology

## 2017-02-25 DIAGNOSIS — R040 Epistaxis: Secondary | ICD-10-CM | POA: Insufficient documentation

## 2017-02-25 NOTE — Assessment & Plan Note (Signed)
Her CBC is stable except for mild anemia, likely due to recent bleeding The patient stated she has been compliant taking medications as directed She would take 1000 mg on Mondays, Wednesdays and Fridays and to take 500 mg for the rest of the week I suspect a mild elevated platelet count could be due to component of recent bleeding and mild iron deficiency I plan to see her back next month again for close follow-up

## 2017-02-25 NOTE — Progress Notes (Signed)
[place] OFFICE PROGRESS NOTE  Patient Care Team: Gayland Curry, DO as PCP - General (Geriatric Medicine) Larey Dresser, MD as PCP - Cardiology (Cardiology) [facility], Clent Demark, MD as Consulting Physician (Ophthalmology) Larey Dresser, MD as Consulting Physician (Cardiology) Heath Lark, MD as Consulting Physician (Hematology and Oncology) Clent Jacks, MD as Consulting Physician (Ophthalmology) Jerrell Belfast, MD as Consulting Physician (Otolaryngology)  SUMMARY OF ONCOLOGIC HISTORY:   Polycythemia vera ([place])   08/05/2011 Pathology Results    Peripheral blood JAK2 mutation was positive with low serum erythropoietin level. Bone marrow aspirate and biopsy was not performed.      08/12/2011 -  Chemotherapy    She is started on hydroxyurea with periodic phlebotomy.       INTERVAL HISTORY: Please see below for problem oriented charting. She was seen urgently due to his recent severe nasal bleeding/epistaxis requiring nasal packing Is stop after 5 days of nasal packing She have extensive discussion with her ENT doctor and cardiologist in regards to role of chronic anticoagulation therapy for stroke prevention She denies hematuria or hematochezia She continues taking hydroxyurea throughout the entire time  REVIEW OF SYSTEMS:   Constitutional: Denies fevers, chills or abnormal weight loss Eyes: Denies blurriness of vision Ears, nose, mouth, throat, and face: Denies mucositis or sore throat Respiratory: Denies cough, dyspnea or wheezes Cardiovascular: Denies palpitation, chest discomfort or lower extremity swelling Gastrointestinal:  Denies nausea, heartburn or change in bowel habits Skin: Denies abnormal skin rashes Lymphatics: Denies new lymphadenopathy or easy bruising Neurological:Denies numbness, tingling or new weaknesses Behavioral/Psych: Mood is stable, no new changes  All other systems were reviewed with the patient  and are negative.  I have reviewed the past medical history, past surgical history, social history and family history with the patient and they are unchanged from previous note.  ALLERGIES:  is allergic to amlodipine.  MEDICATIONS:  Current Outpatient Medications  Medication Sig Dispense Refill  . carboxymethylcellulose (REFRESH PLUS) 0.5 % SOLN Place 1 drop into both eyes 3 (three) times daily as needed (Dry and itchy eyes).    . cholecalciferol (VITAMIN D) 1000 UNITS tablet Take 2,000 Units by mouth daily.     . fentaNYL (DURAGESIC - DOSED MCG/HR) 25 MCG/HR patch Place 1 patch (25 mcg total) onto the skin every 3 (three) days. 10 patch 0  . furosemide (LASIX) 40 MG tablet Take 80 mg by mouth daily.     [name] Kitchen gabapentin (NEURONTIN) 100 MG capsule Take 100 mg by mouth at bedtime.    . hydrALAZINE (APRESOLINE) 50 MG tablet Take 1.5 tablets (75 mg total) by mouth 3 (three) times daily. 135 tablet 3  . hydroxyurea (HYDREA) 500 MG capsule Take 2 capsules by mouth on Monday, Wednesday, Friday. Take 1 capsule the rest of the week.    . levothyroxine (SYNTHROID, LEVOTHROID) 100 MCG tablet Take 1 tablet (100 mcg total) by mouth daily before breakfast. 30 tablet 3  . Magnesium 250 MG TABS Take 250 mg by mouth daily.     . Melatonin 10 MG TABS Take 10 mg by mouth at bedtime.    . Multiple Vitamins-Minerals (PRESERVISION AREDS 2) CAPS Take 2 capsules by mouth daily.     . polycarbophil (FIBERCON) 625 MG tablet Take 2 tablets (1,250 mg total) by mouth 2 (two) times daily. 60 tablet 0  . potassium chloride (K-DUR,KLOR-CON) 10 MEQ tablet Take 10 mEq by mouth daily.     . Rivaroxaban (XARELTO) 15  MG TABS tablet Take 15 mg by mouth daily.    . traMADol (ULTRAM) 50 MG tablet Take 1 tablet (50 mg total) by mouth at bedtime. 30 tablet 0   No current facility-administered medications for this visit.     PHYSICAL EXAMINATION: ECOG PERFORMANCE STATUS: 1 - Symptomatic but completely ambulatory  Vitals:    02/24/17 1439  BP: 135/63  Pulse: (!) 58  Resp: 18  Temp: 98.6 F (37 C)  SpO2: 100%   Filed Weights   02/24/17 1439  Weight: 105 lb 12.8 oz (48 kg)    GENERAL:alert, no distress and comfortable SKIN: skin color, texture, turgor are normal, no rashes or significant lesions EYES: normal, Conjunctiva are pink and non-injected, sclera clear OROPHARYNX:no exudate, no erythema and lips, buccal mucosa, and tongue normal  NECK: supple, thyroid normal size, non-tender, without nodularity LYMPH:  no palpable lymphadenopathy in the cervical, axillary or inguinal LUNGS: clear to auscultation and percussion with normal breathing effort HEART: regular rate & rhythm and no murmurs and no lower extremity edema ABDOMEN:abdomen soft, non-tender and normal bowel sounds Musculoskeletal:no cyanosis of digits and no clubbing  NEURO: alert & oriented x 3 with fluent speech, no focal motor/sensory deficits  LABORATORY DATA:  I have reviewed the data as listed    Component Value Date/Time   NA 138 02/17/2017 0300   NA 136 09/14/2016 1428   K 4.3 02/17/2017 0300   K 4.0 09/14/2016 1428   CL 97 (L) 02/07/2017 2305   CO2 27 02/07/2017 2305   CO2 30 (H) 09/14/2016 1428   GLUCOSE 117 (H) 02/07/2017 2305   GLUCOSE 112 09/14/2016 1428   BUN 12 02/17/2017 0300   BUN 14.7 09/14/2016 1428   CREATININE 0.6 02/17/2017 0300   CREATININE 0.80 02/07/2017 2305   CREATININE 0.8 09/14/2016 1428   CALCIUM 10.7 (H) 02/07/2017 2305   CALCIUM 11.0 (H) 09/14/2016 1428   PROT 6.6 02/07/2017 2305   PROT 7.0 09/14/2016 1428   ALBUMIN 4.7 02/07/2017 2305   ALBUMIN 4.6 09/14/2016 1428   AST 21 02/07/2017 2305   AST 17 09/14/2016 1428   ALT 16 02/07/2017 2305   ALT 18 09/14/2016 1428   ALKPHOS 59 02/07/2017 2305   ALKPHOS 62 09/14/2016 1428   BILITOT 0.9 02/07/2017 2305   BILITOT 1.00 09/14/2016 1428   GFRNONAA >60 02/07/2017 2305   GFRAA >60 02/07/2017 2305    No results found for: SPEP, UPEP  Lab Results   Component Value Date   WBC 6.7 02/24/2017   NEUTROABS 5.1 02/24/2017   HGB 11.1 (L) 02/24/2017   HCT 34.6 (L) 02/24/2017   MCV 113.8 (H) 02/24/2017   PLT 541 (H) 02/24/2017      Chemistry      Component Value Date/Time   NA 138 02/17/2017 0300   NA 136 09/14/2016 1428   K 4.3 02/17/2017 0300   K 4.0 09/14/2016 1428   CL 97 (L) 02/07/2017 2305   CO2 27 02/07/2017 2305   CO2 30 (H) 09/14/2016 1428   BUN 12 02/17/2017 0300   BUN 14.7 09/14/2016 1428   CREATININE 0.6 02/17/2017 0300   CREATININE 0.80 02/07/2017 2305   CREATININE 0.8 09/14/2016 1428   GLU 79 02/17/2017 0300      Component Value Date/Time   CALCIUM 10.7 (H) 02/07/2017 2305   CALCIUM 11.0 (H) 09/14/2016 1428   ALKPHOS 59 02/07/2017 2305   ALKPHOS 62 09/14/2016 1428   AST 21 02/07/2017 2305   AST 17 09/14/2016  1428   ALT 16 02/07/2017 2305   ALT 18 09/14/2016 1428   BILITOT 0.9 02/07/2017 2305   BILITOT 1.00 09/14/2016 1428      ASSESSMENT & PLAN:  Polycythemia vera Her CBC is stable except for mild anemia, likely due to recent bleeding The patient stated she has been compliant taking medications as directed She would take 1000 mg on Mondays, Wednesdays and Fridays and to take 500 mg for the rest of the week I suspect a mild elevated platelet count could be due to component of recent bleeding and mild iron deficiency I plan to see her back next month again for close follow-up  Iron deficiency anemia This is due to recent severe epistaxis I recommend low-dose iron supplement every other day  Recurrent epistaxis She had recent severe epistaxis likely precipitated by dry weather and anticoagulation therapy Her bleeding stopped subsequently after nasal packing She has seen her cardiologist recently but with further discussion about the role of chronic anticoagulation therapy I would defer to her cardiologist/ENT for further management In the meantime, I recommend conservative approach with moisturizer  to avoid excessive nasal dryness   No orders of the defined types were placed in this encounter.  All questions were answered. The patient knows to call the clinic with any problems, questions or concerns. No barriers to learning was detected. I spent 15 minutes counseling the patient face to face. The total time spent in the appointment was 20 minutes and more than 50% was on counseling and review of test results     Heath Lark, MD 02/25/2017 8:11 AM

## 2017-02-25 NOTE — Assessment & Plan Note (Signed)
This is due to recent severe epistaxis I recommend low-dose iron supplement every other day

## 2017-02-25 NOTE — Assessment & Plan Note (Signed)
She had recent severe epistaxis likely precipitated by dry weather and anticoagulation therapy Her bleeding stopped subsequently after nasal packing She has seen her cardiologist recently but with further discussion about the role of chronic anticoagulation therapy I would defer to her cardiologist/ENT for further management In the meantime, I recommend conservative approach with moisturizer to avoid excessive nasal dryness

## 2017-02-25 NOTE — Telephone Encounter (Signed)
No new orders or visits per 1/17 los.  °

## 2017-03-01 DIAGNOSIS — H353211 Exudative age-related macular degeneration, right eye, with active choroidal neovascularization: Secondary | ICD-10-CM | POA: Diagnosis not present

## 2017-03-01 DIAGNOSIS — H353112 Nonexudative age-related macular degeneration, right eye, intermediate dry stage: Secondary | ICD-10-CM | POA: Diagnosis not present

## 2017-03-01 DIAGNOSIS — H353124 Nonexudative age-related macular degeneration, left eye, advanced atrophic with subfoveal involvement: Secondary | ICD-10-CM | POA: Diagnosis not present

## 2017-03-01 DIAGNOSIS — H353222 Exudative age-related macular degeneration, left eye, with inactive choroidal neovascularization: Secondary | ICD-10-CM | POA: Diagnosis not present

## 2017-03-10 ENCOUNTER — Encounter: Payer: Self-pay | Admitting: Internal Medicine

## 2017-03-10 DIAGNOSIS — Z961 Presence of intraocular lens: Secondary | ICD-10-CM | POA: Diagnosis not present

## 2017-03-10 DIAGNOSIS — H353211 Exudative age-related macular degeneration, right eye, with active choroidal neovascularization: Secondary | ICD-10-CM | POA: Diagnosis not present

## 2017-03-10 DIAGNOSIS — H353222 Exudative age-related macular degeneration, left eye, with inactive choroidal neovascularization: Secondary | ICD-10-CM | POA: Diagnosis not present

## 2017-03-10 LAB — HM DIABETES EYE EXAM

## 2017-03-14 ENCOUNTER — Other Ambulatory Visit: Payer: Self-pay | Admitting: *Deleted

## 2017-03-14 MED ORDER — FENTANYL 25 MCG/HR TD PT72: 25.0000 ug | MEDICATED_PATCH | TRANSDERMAL | 0 refills | Status: DC

## 2017-03-14 NOTE — Telephone Encounter (Signed)
Patient requested [facility] Database Verified Pharmacy Confirmed Pended Rx and sent to Dr. Reed for approval.  

## 2017-03-15 ENCOUNTER — Telehealth: Payer: Self-pay | Admitting: Hematology and Oncology

## 2017-03-15 ENCOUNTER — Inpatient Hospital Stay: Payer: Medicare Other

## 2017-03-15 ENCOUNTER — Other Ambulatory Visit: Payer: Self-pay | Admitting: Internal Medicine

## 2017-03-15 ENCOUNTER — Inpatient Hospital Stay: Payer: Medicare Other | Attending: Hematology and Oncology | Admitting: Hematology and Oncology

## 2017-03-15 DIAGNOSIS — D509 Iron deficiency anemia, unspecified: Secondary | ICD-10-CM | POA: Insufficient documentation

## 2017-03-15 DIAGNOSIS — D45 Polycythemia vera: Secondary | ICD-10-CM

## 2017-03-15 DIAGNOSIS — D500 Iron deficiency anemia secondary to blood loss (chronic): Secondary | ICD-10-CM

## 2017-03-15 DIAGNOSIS — K5900 Constipation, unspecified: Secondary | ICD-10-CM | POA: Diagnosis not present

## 2017-03-15 LAB — IRON AND TIBC
Iron: 58 ug/dL (ref 41–142)
SATURATION RATIOS: 19 % — AB (ref 21–57)
TIBC: 310 ug/dL (ref 236–444)
UIBC: 253 ug/dL

## 2017-03-15 LAB — CBC WITH DIFFERENTIAL/PLATELET
Basophils Absolute: 0.1 10*3/uL (ref 0.0–0.1)
Basophils Relative: 1 %
EOS ABS: 0.1 10*3/uL (ref 0.0–0.5)
Eosinophils Relative: 1 %
HEMATOCRIT: 33.7 % — AB (ref 34.8–46.6)
HEMOGLOBIN: 11.1 g/dL — AB (ref 11.6–15.9)
Lymphocytes Relative: 17 %
Lymphs Abs: 1 10*3/uL (ref 0.9–3.3)
MCH: 36.2 pg — ABNORMAL HIGH (ref 25.1–34.0)
MCHC: 33.1 g/dL (ref 31.5–36.0)
MCV: 109.5 fL — ABNORMAL HIGH (ref 79.5–101.0)
MONOS PCT: 9 %
Monocytes Absolute: 0.5 10*3/uL (ref 0.1–0.9)
NEUTROS ABS: 4.3 10*3/uL (ref 1.5–6.5)
NEUTROS PCT: 72 %
Platelets: 478 10*3/uL — ABNORMAL HIGH (ref 145–400)
RBC: 3.07 MIL/uL — ABNORMAL LOW (ref 3.70–5.45)
RDW: 15.6 % — ABNORMAL HIGH (ref 11.2–14.5)
WBC: 5.9 10*3/uL (ref 3.9–10.3)

## 2017-03-15 LAB — FERRITIN: Ferritin: 39 ng/mL (ref 9–269)

## 2017-03-15 NOTE — Telephone Encounter (Signed)
Gave patient AVS and calendar of upcoming April appointments.  °

## 2017-03-16 ENCOUNTER — Encounter: Payer: Self-pay | Admitting: Hematology and Oncology

## 2017-03-16 MED FILL — SHINGRIX VIAL KIT: 50 | 1 days supply | Qty: 1 | Fill #1

## 2017-03-16 NOTE — Assessment & Plan Note (Signed)
The patient cannot tolerate oral iron supplement despite switching it to every other day She wants to stop Recent iron panel is stable I recommend iron rich diet such as dairy products and meat

## 2017-03-16 NOTE — Progress Notes (Signed)
[facility] OFFICE PROGRESS NOTE  Patient Care Team: Gayland Curry, DO as PCP - General (Geriatric Medicine) Larey Dresser, MD as PCP - Cardiology (Cardiology) [facility], Clent Demark, MD as Consulting Physician (Ophthalmology) Larey Dresser, MD as Consulting Physician (Cardiology) Heath Lark, MD as Consulting Physician (Hematology and Oncology) Clent Jacks, MD as Consulting Physician (Ophthalmology) Jerrell Belfast, MD as Consulting Physician (Otolaryngology)  SUMMARY OF ONCOLOGIC HISTORY:   Polycythemia vera ([place])   08/05/2011 Pathology Results    Peripheral blood JAK2 mutation was positive with low serum erythropoietin level. Bone marrow aspirate and biopsy was not performed.      08/12/2011 -  Chemotherapy    She is started on hydroxyurea with periodic phlebotomy.       INTERVAL HISTORY: Please see below for problem oriented charting. She returns for further follow-up She denies further nosebleeds since the last time I saw her Her energy level is fair She wants to stop iron supplement due to severe constipation despite taking a low-dose every other day  REVIEW OF SYSTEMS:   Constitutional: Denies fevers, chills or abnormal weight loss Eyes: Denies blurriness of vision Ears, nose, mouth, throat, and face: Denies mucositis or sore throat Respiratory: Denies cough, dyspnea or wheezes Cardiovascular: Denies palpitation, chest discomfort or lower extremity swelling Gastrointestinal:  Denies nausea, heartburn or change in bowel habits Skin: Denies abnormal skin rashes Lymphatics: Denies new lymphadenopathy or easy bruising Neurological:Denies numbness, tingling or new weaknesses Behavioral/Psych: Mood is stable, no new changes  All other systems were reviewed with the patient and are negative.  I have reviewed the past medical history, past surgical history, social history and family history with the patient and they are  unchanged from previous note.  ALLERGIES:  is allergic to amlodipine.  MEDICATIONS:  Current Outpatient Medications  Medication Sig Dispense Refill  . carboxymethylcellulose (REFRESH PLUS) 0.5 % SOLN Place 1 drop into both eyes 3 (three) times daily as needed (Dry and itchy eyes).    . cholecalciferol (VITAMIN D) 1000 UNITS tablet Take 2,000 Units by mouth daily.     . fentaNYL (DURAGESIC - DOSED MCG/HR) 25 MCG/HR patch Place 1 patch (25 mcg total) onto the skin every 3 (three) days. 10 patch 0  . furosemide (LASIX) 40 MG tablet Take 80 mg by mouth daily.     [name] Kitchen gabapentin (NEURONTIN) 100 MG capsule Take 100 mg by mouth at bedtime.    . hydrALAZINE (APRESOLINE) 50 MG tablet Take 1.5 tablets (75 mg total) by mouth 3 (three) times daily. 135 tablet 3  . hydroxyurea (HYDREA) 500 MG capsule Take 2 capsules by mouth on Monday, Wednesday, Friday. Take 1 capsule the rest of the week.    . levothyroxine (SYNTHROID, LEVOTHROID) 100 MCG tablet Take 1 tablet (100 mcg total) by mouth daily before breakfast. 30 tablet 3  . Magnesium 250 MG TABS Take 250 mg by mouth daily.     . Melatonin 10 MG TABS Take 10 mg by mouth at bedtime.    . Multiple Vitamins-Minerals (PRESERVISION AREDS 2) CAPS Take 2 capsules by mouth daily.     . polycarbophil (FIBERCON) 625 MG tablet Take 2 tablets (1,250 mg total) by mouth 2 (two) times daily. 60 tablet 0  . potassium chloride (K-DUR) 10 MEQ tablet TAKE 1 TABLET BY MOUTH TWICE DAILY. 60 tablet 0  . potassium chloride (K-DUR,KLOR-CON) 10 MEQ tablet Take 10 mEq by mouth daily.     . Rivaroxaban (XARELTO) 15  MG TABS tablet Take 15 mg by mouth daily.    . traMADol (ULTRAM) 50 MG tablet Take 1 tablet (50 mg total) by mouth at bedtime. 30 tablet 0   No current facility-administered medications for this visit.     PHYSICAL EXAMINATION: ECOG PERFORMANCE STATUS: 1 - Symptomatic but completely ambulatory  Vitals:   03/15/17 1448  BP: 128/66  Pulse: 64  Resp: 18  Temp: 98.6  F (37 C)  SpO2: 98%   Filed Weights   03/15/17 1448  Weight: 105 lb 9.6 oz (47.9 kg)    GENERAL:alert, no distress and comfortable SKIN: skin color, texture, turgor are normal, no rashes or significant lesions Musculoskeletal:no cyanosis of digits and no clubbing  NEURO: alert & oriented x 3 with fluent speech, no focal motor/sensory deficits  LABORATORY DATA:  I have reviewed the data as listed    Component Value Date/Time   NA 138 02/17/2017   NA 136 09/14/2016 1428   K 4.3 02/17/2017   K 4.0 09/14/2016 1428   CL 97 (L) 02/07/2017 2305   CO2 27 02/07/2017 2305   CO2 30 (H) 09/14/2016 1428   GLUCOSE 117 (H) 02/07/2017 2305   GLUCOSE 112 09/14/2016 1428   BUN 12 02/17/2017   BUN 14.7 09/14/2016 1428   CREATININE 0.6 02/17/2017   CREATININE 0.80 02/07/2017 2305   CREATININE 0.8 09/14/2016 1428   CALCIUM 10.7 (H) 02/07/2017 2305   CALCIUM 11.0 (H) 09/14/2016 1428   PROT 6.6 02/07/2017 2305   PROT 7.0 09/14/2016 1428   ALBUMIN 4.7 02/07/2017 2305   ALBUMIN 4.6 09/14/2016 1428   AST 21 02/07/2017 2305   AST 17 09/14/2016 1428   ALT 16 02/07/2017 2305   ALT 18 09/14/2016 1428   ALKPHOS 59 02/07/2017 2305   ALKPHOS 62 09/14/2016 1428   BILITOT 0.9 02/07/2017 2305   BILITOT 1.00 09/14/2016 1428   GFRNONAA >60 02/07/2017 2305   GFRAA >60 02/07/2017 2305    No results found for: SPEP, UPEP  Lab Results  Component Value Date   WBC 5.9 03/15/2017   NEUTROABS 4.3 03/15/2017   HGB 11.1 (L) 03/15/2017   HCT 33.7 (L) 03/15/2017   MCV 109.5 (H) 03/15/2017   PLT 478 (H) 03/15/2017      Chemistry      Component Value Date/Time   NA 138 02/17/2017   NA 136 09/14/2016 1428   K 4.3 02/17/2017   K 4.0 09/14/2016 1428   CL 97 (L) 02/07/2017 2305   CO2 27 02/07/2017 2305   CO2 30 (H) 09/14/2016 1428   BUN 12 02/17/2017   BUN 14.7 09/14/2016 1428   CREATININE 0.6 02/17/2017   CREATININE 0.80 02/07/2017 2305   CREATININE 0.8 09/14/2016 1428   GLU 79 02/17/2017       Component Value Date/Time   CALCIUM 10.7 (H) 02/07/2017 2305   CALCIUM 11.0 (H) 09/14/2016 1428   ALKPHOS 59 02/07/2017 2305   ALKPHOS 62 09/14/2016 1428   AST 21 02/07/2017 2305   AST 17 09/14/2016 1428   ALT 16 02/07/2017 2305   ALT 18 09/14/2016 1428   BILITOT 0.9 02/07/2017 2305   BILITOT 1.00 09/14/2016 1428       ASSESSMENT & PLAN:  Polycythemia vera Her CBC is stable except for mild anemia, likely due to recent bleeding The patient stated she has been compliant taking medications as directed She would take 1000 mg on Mondays, Wednesdays and Fridays and to take 500 mg for the rest of  the week I suspect a mild elevated platelet count could be due to component of recent bleeding and mild iron deficiency, slightly improved compared to before I plan to see her back in 2 months for close follow-up  Iron deficiency anemia The patient cannot tolerate oral iron supplement despite switching it to every other day She wants to stop Recent iron panel is stable I recommend iron rich diet such as dairy products and meat   No orders of the defined types were placed in this encounter.  All questions were answered. The patient knows to call the clinic with any problems, questions or concerns. No barriers to learning was detected. I spent 10 minutes counseling the patient face to face. The total time spent in the appointment was 15 minutes and more than 50% was on counseling and review of test results     Heath Lark, MD 03/16/2017 8:27 AM

## 2017-03-16 NOTE — Assessment & Plan Note (Signed)
Her CBC is stable except for mild anemia, likely due to recent bleeding The patient stated she has been compliant taking medications as directed She would take 1000 mg on Mondays, Wednesdays and Fridays and to take 500 mg for the rest of the week I suspect a mild elevated platelet count could be due to component of recent bleeding and mild iron deficiency, slightly improved compared to before I plan to see her back in 2 months for close follow-up

## 2017-03-19 ENCOUNTER — Other Ambulatory Visit (HOSPITAL_COMMUNITY): Payer: Self-pay | Admitting: Cardiology

## 2017-03-19 ENCOUNTER — Other Ambulatory Visit: Payer: Self-pay | Admitting: Internal Medicine

## 2017-03-19 DIAGNOSIS — M545 Low back pain, unspecified: Secondary | ICD-10-CM

## 2017-03-29 DIAGNOSIS — H353221 Exudative age-related macular degeneration, left eye, with active choroidal neovascularization: Secondary | ICD-10-CM | POA: Diagnosis not present

## 2017-03-29 DIAGNOSIS — H353124 Nonexudative age-related macular degeneration, left eye, advanced atrophic with subfoveal involvement: Secondary | ICD-10-CM | POA: Diagnosis not present

## 2017-03-29 DIAGNOSIS — H353211 Exudative age-related macular degeneration, right eye, with active choroidal neovascularization: Secondary | ICD-10-CM | POA: Diagnosis not present

## 2017-03-31 DIAGNOSIS — S0502XA Injury of conjunctiva and corneal abrasion without foreign body, left eye, initial encounter: Secondary | ICD-10-CM | POA: Diagnosis not present

## 2017-03-31 DIAGNOSIS — H353222 Exudative age-related macular degeneration, left eye, with inactive choroidal neovascularization: Secondary | ICD-10-CM | POA: Diagnosis not present

## 2017-03-31 DIAGNOSIS — H353211 Exudative age-related macular degeneration, right eye, with active choroidal neovascularization: Secondary | ICD-10-CM | POA: Diagnosis not present

## 2017-03-31 DIAGNOSIS — H353221 Exudative age-related macular degeneration, left eye, with active choroidal neovascularization: Secondary | ICD-10-CM | POA: Diagnosis not present

## 2017-04-05 ENCOUNTER — Other Ambulatory Visit: Payer: Self-pay | Admitting: Hematology and Oncology

## 2017-04-05 DIAGNOSIS — H353222 Exudative age-related macular degeneration, left eye, with inactive choroidal neovascularization: Secondary | ICD-10-CM | POA: Diagnosis not present

## 2017-04-05 DIAGNOSIS — H353221 Exudative age-related macular degeneration, left eye, with active choroidal neovascularization: Secondary | ICD-10-CM | POA: Diagnosis not present

## 2017-04-05 DIAGNOSIS — H353124 Nonexudative age-related macular degeneration, left eye, advanced atrophic with subfoveal involvement: Secondary | ICD-10-CM | POA: Diagnosis not present

## 2017-04-05 DIAGNOSIS — H353211 Exudative age-related macular degeneration, right eye, with active choroidal neovascularization: Secondary | ICD-10-CM | POA: Diagnosis not present

## 2017-04-12 ENCOUNTER — Other Ambulatory Visit: Payer: Self-pay | Admitting: *Deleted

## 2017-04-12 MED ORDER — FENTANYL 25 MCG/HR TD PT72: 25.0000 ug | MEDICATED_PATCH | TRANSDERMAL | 0 refills | Status: DC

## 2017-04-12 NOTE — Telephone Encounter (Signed)
Patient requested refill [facility] Verified Pharmacy Confirmed.  Pended Rx and sent to Dr. Reed for approval.  

## 2017-04-18 ENCOUNTER — Other Ambulatory Visit: Payer: Self-pay | Admitting: Internal Medicine

## 2017-04-18 DIAGNOSIS — M545 Low back pain, unspecified: Secondary | ICD-10-CM

## 2017-04-29 ENCOUNTER — Encounter: Payer: Self-pay | Admitting: Family Medicine

## 2017-04-30 ENCOUNTER — Other Ambulatory Visit (HOSPITAL_COMMUNITY): Payer: Self-pay | Admitting: Nurse Practitioner

## 2017-05-02 ENCOUNTER — Other Ambulatory Visit (HOSPITAL_COMMUNITY): Payer: Self-pay | Admitting: Cardiology

## 2017-05-02 ENCOUNTER — Encounter: Payer: Self-pay | Admitting: Family Medicine

## 2017-05-03 DIAGNOSIS — H353221 Exudative age-related macular degeneration, left eye, with active choroidal neovascularization: Secondary | ICD-10-CM | POA: Diagnosis not present

## 2017-05-03 DIAGNOSIS — H353211 Exudative age-related macular degeneration, right eye, with active choroidal neovascularization: Secondary | ICD-10-CM | POA: Diagnosis not present

## 2017-05-03 DIAGNOSIS — H353124 Nonexudative age-related macular degeneration, left eye, advanced atrophic with subfoveal involvement: Secondary | ICD-10-CM | POA: Diagnosis not present

## 2017-05-04 ENCOUNTER — Other Ambulatory Visit (HOSPITAL_COMMUNITY): Payer: Self-pay | Admitting: Cardiology

## 2017-05-04 NOTE — Progress Notes (Signed)
[facility] [address], [place] [address] Phone: [phone] Subjective:     CC: Right shoulder pain  [id number]  Sarah Mays is a 84 y.o. female coming in with complaint of right shoulder pain over the trap. The pain goes up into her neck. Her pain has been constant for 1 week. She has had some improvement but wanted to see what might be the cause. She does occasionally have stabbing pain. Denies any radiating symptoms.  Patient was last seen in August and had trigger point injections and tolerated the procedures well at that time.  Patient has started a new exercise class and thinks that that possibly could have started giving her some of the discomfort and pain.      Past Medical History:  Diagnosis Date  . Abnormal stress test    a. 11/2011 Ex MV: EF 80%, small, partially reversible anteroapical defect->mild ischemia vs attenuation-->med Rx.  [name] Kitchen Alopecia, unspecified   . Arthritis   . Chronic diastolic CHF (congestive heart failure) ([place])    a. 11/2013 Echo: EF 60-65%, no rwma, mild TR, PASP 5mmHg.  . Closed fracture of lower end of radius with ulna   . Deafness    Left, s/p multiple surgeries  . Essential hypertension   . Gouty arthropathy, unspecified   . History of kidney stones   . Hypothyroidism   . Insomnia, unspecified   . Macular degeneration    Left, s/p inj. tx  . Mitral valve disorders(424.0)   . Neck mass    right, w/u including MRI negative  . PAF (paroxysmal atrial fibrillation) ([facility])    a. on amio (02/2012 mild obstruction on PFT's) and xarelto.  . Polycythemia vera(238.4)   . PONV (postoperative nausea and vomiting)    no problem last surgery  . Pure hypercholesterolemia   . Senile osteoporosis    Reclast in the past  . Tricuspid valve disorders, specified as nonrheumatic    Past Surgical History:  Procedure Laterality Date  . ABLATION  08/28/14   Back   . BREAST BIOPSY  06/18/1998   left  . BREAST  BIOPSY Right   . ELECTROPHYSIOLOGIC STUDY N/A 01/08/2016   Procedure: Atrial Fibrillation Ablation;  Surgeon: Thompson Grayer, MD;  Location: [facility];  Service: Cardiovascular;  Laterality: N/A;  . FEMUR IM NAIL Right 10/08/2015   Procedure: INTRAMEDULLARY (IM) NAIL FEMORAL RIGHT;  Surgeon: Paralee Cancel, MD;  Location: [facility];  Service: Orthopedics;  Laterality: Right;  . MASS EXCISION Right 11/26/2016   Procedure: EXCISION RIGHT LATERAL NECK MASS;  Surgeon: Jerrell Belfast, MD;  Location: [facility];  Service: ENT;  Laterality: Right;  . TONSILLECTOMY    . TONSILLECTOMY    . TOTAL ABDOMINAL HYSTERECTOMY    . TYMPANOPLASTY Bilateral    Social History   Socioeconomic History  . Marital status: Married    Spouse name: [name]  . Number of children: 2  . Years of education: 75  . Highest education level: Not on file  Occupational History  . Not on file  Social Needs  . Financial resource strain: Not on file  . Food insecurity:    Worry: Not on file    Inability: Not on file  . Transportation needs:    Medical: Not on file    Non-medical: Not on file  Tobacco Use  . Smoking status: Former Smoker    Types: Cigarettes    Last attempt to quit: 01/19/1984  Years since quitting: 33.3  . Smokeless tobacco: Never Used  Substance and Sexual Activity  . Alcohol use: Yes    Alcohol/week: 0.6 oz    Types: 1 Glasses of wine per week    Comment: 2 per day/ 14 a week  . Drug use: No  . Sexual activity: Not Currently  Lifestyle  . Physical activity:    Days per week: Not on file    Minutes per session: Not on file  . Stress: Not on file  Relationships  . Social connections:    Talks on phone: Not on file    Gets together: Not on file    Attends religious service: Not on file    Active member of club or organization: Not on file    Attends meetings of clubs or organizations: Not on file    Relationship status: Not on file  Other Topics Concern  . Not on file  Social History  Narrative   Patient is Married 1955. 2 kids. 75 grandkid-82 years old in 2016. College graduate [name]).    Lives in apartment,  Independent Living  section at [facility] since 02/2013.      Stay at home mother.       No Smoking history, Mod. alcohol use.   Patient has a living will, POA      Hobbies: CPU and painting         Allergies  Allergen Reactions  . Amlodipine Swelling and Other (See Comments)    Reaction:  Lower extremity swelling    Family History  Problem Relation Age of Onset  . Hypertension Father   . Heart disease Father        patient does not know details.   . Ovarian cancer Mother 81  . Cancer Sister        colon  . Heart disease Sister   . Cancer Sister   . Cancer Sister        hodgkin disease  . Breast cancer Daughter   . Cancer Daughter        breast     Past medical history, social, surgical and family history all reviewed in electronic medical record.  No pertanent information unless stated regarding to the chief complaint.   Review of Systems:Review of systems updated and as accurate as of 05/05/17  No headache, visual changes, nausea, vomiting, diarrhea, constipation, dizziness, abdominal pain, skin rash, fevers, chills, night sweats, weight loss, swollen lymph nodes, body aches, joint swelling,  chest pain, shortness of breath, mood changes.  Positive muscle aches  Objective  Blood pressure 110/72, pulse 65, weight 104 lb (47.2 kg), SpO2 97 %. Systems examined below as of 05/05/17   General: No apparent distress alert and oriented x3 mood and affect normal, dressed appropriately.  Mild cachectic HEENT: Pupils equal, extraocular movements intact  Respiratory: Patient's speak in full sentences and does not appear short of breath  Cardiovascular: No lower extremity edema, non tender, no erythema  Skin: Warm dry intact with no signs of infection or rash on extremities or on axial skeleton.  Abdomen: Soft nontender    Neuro: Cranial nerves II through XII are intact, neurovascularly intact in all extremities with 2+ DTRs and 2+ pulses.  Lymph: No lymphadenopathy of posterior or anterior cervical chain or axillae bilaterally.  Gait antalgic, walking with the aid of a walker MSK:  tender with mild limitation in range of motion range of motion and good stability and symmetric strength  and tone of  elbows, wrist, hip, knee and ankles bilaterally.  Right shoulder does show some atrophy of the musculature but very similar to the contralateral side.  Mild impingement noted.  Severe trigger points noted in the right scapular region.  After verbal consent patient was prepped with alcohol swabs and with a 25-gauge half inch needle was injected in 4 distinct trigger points in the right shoulder region.  Total of 3 cc of 0.5% Marcaine and 1 cc of Kenalog 40 mg/mL used.  No blood loss.  Postinjection instructions given..    Impression and Recommendations:     This case required medical decision making of moderate complexity.      Note: This dictation was prepared with Dragon dictation along with smaller phrase technology. Any transcriptional errors that result from this process are unintentional.

## 2017-05-05 ENCOUNTER — Ambulatory Visit: Payer: Self-pay

## 2017-05-05 ENCOUNTER — Ambulatory Visit (INDEPENDENT_AMBULATORY_CARE_PROVIDER_SITE_OTHER): Payer: Medicare Other | Admitting: Family Medicine

## 2017-05-05 ENCOUNTER — Encounter: Payer: Self-pay | Admitting: Family Medicine

## 2017-05-05 VITALS — BP 110/72 | HR 65 | Wt 104.0 lb

## 2017-05-05 DIAGNOSIS — M25511 Pain in right shoulder: Secondary | ICD-10-CM

## 2017-05-05 NOTE — Assessment & Plan Note (Signed)
Patient given the injections and tolerated the procedures well.  We discussed icing regimen and home exercises.  Patient may need a intra-articular injection as well.  We will monitor.  Follow-up again 4 weeks

## 2017-05-10 ENCOUNTER — Inpatient Hospital Stay (HOSPITAL_BASED_OUTPATIENT_CLINIC_OR_DEPARTMENT_OTHER): Payer: Medicare Other | Admitting: Hematology and Oncology

## 2017-05-10 ENCOUNTER — Inpatient Hospital Stay: Payer: Medicare Other | Attending: Hematology and Oncology

## 2017-05-10 ENCOUNTER — Telehealth: Payer: Self-pay | Admitting: Hematology and Oncology

## 2017-05-10 ENCOUNTER — Ambulatory Visit: Payer: Medicare Other | Admitting: Family Medicine

## 2017-05-10 ENCOUNTER — Encounter: Payer: Self-pay | Admitting: Hematology and Oncology

## 2017-05-10 DIAGNOSIS — D45 Polycythemia vera: Secondary | ICD-10-CM

## 2017-05-10 DIAGNOSIS — Z79899 Other long term (current) drug therapy: Secondary | ICD-10-CM | POA: Insufficient documentation

## 2017-05-10 LAB — CBC WITH DIFFERENTIAL/PLATELET
Basophils Absolute: 0.1 10*3/uL (ref 0.0–0.1)
Basophils Relative: 1 %
EOS ABS: 0 10*3/uL (ref 0.0–0.5)
Eosinophils Relative: 1 %
HCT: 42.2 % (ref 34.8–46.6)
HEMOGLOBIN: 13.5 g/dL (ref 11.6–15.9)
Lymphocytes Relative: 13 %
Lymphs Abs: 1.1 10*3/uL (ref 0.9–3.3)
MCH: 34.5 pg — AB (ref 25.1–34.0)
MCHC: 32.1 g/dL (ref 31.5–36.0)
MCV: 107.3 fL — ABNORMAL HIGH (ref 79.5–101.0)
MONOS PCT: 6 %
Monocytes Absolute: 0.5 10*3/uL (ref 0.1–0.9)
NEUTROS PCT: 79 %
Neutro Abs: 6.4 10*3/uL (ref 1.5–6.5)
Platelets: 454 10*3/uL — ABNORMAL HIGH (ref 145–400)
RBC: 3.93 MIL/uL (ref 3.70–5.45)
RDW: 17.4 % — ABNORMAL HIGH (ref 11.2–14.5)
WBC: 8.1 10*3/uL (ref 3.9–10.3)

## 2017-05-10 NOTE — Progress Notes (Signed)
[facility] OFFICE PROGRESS NOTE  Patient Care Team: Gayland Curry, DO as PCP - General (Geriatric Medicine) Larey Dresser, MD as PCP - Cardiology (Cardiology) [facility], Clent Demark, MD as Consulting Physician (Ophthalmology) Larey Dresser, MD as Consulting Physician (Cardiology) Heath Lark, MD as Consulting Physician (Hematology and Oncology) Clent Jacks, MD as Consulting Physician (Ophthalmology) Jerrell Belfast, MD as Consulting Physician (Otolaryngology)  ASSESSMENT & PLAN:  Polycythemia vera Her CBC is improving The patient stated she has been compliant taking medications as directed She would take 1000 mg on Mondays, Wednesdays and Fridays and to take 500 mg for the rest of the week I suspect a mild elevated platelet count could be due to component of recent bleeding and mild iron deficiency, slightly improved compared to before I plan to see her back in 6 months for close follow-up   No orders of the defined types were placed in this encounter.   INTERVAL HISTORY: Please see below for problem oriented charting. She returns for further follow-up She is doing very well She is compliant taking medications as directed Denies recent bleeding No recent infection  SUMMARY OF ONCOLOGIC HISTORY:   Polycythemia vera ([place])   08/05/2011 Pathology Results    Peripheral blood JAK2 mutation was positive with low serum erythropoietin level. Bone marrow aspirate and biopsy was not performed.      08/12/2011 -  Chemotherapy    She is started on hydroxyurea with periodic phlebotomy.       REVIEW OF SYSTEMS:   Constitutional: Denies fevers, chills or abnormal weight loss Eyes: Denies blurriness of vision Ears, nose, mouth, throat, and face: Denies mucositis or sore throat Respiratory: Denies cough, dyspnea or wheezes Cardiovascular: Denies palpitation, chest discomfort or lower extremity swelling Gastrointestinal:  Denies  nausea, heartburn or change in bowel habits Skin: Denies abnormal skin rashes Lymphatics: Denies new lymphadenopathy or easy bruising Neurological:Denies numbness, tingling or new weaknesses Behavioral/Psych: Mood is stable, no new changes  All other systems were reviewed with the patient and are negative.  I have reviewed the past medical history, past surgical history, social history and family history with the patient and they are unchanged from previous note.  ALLERGIES:  is allergic to amlodipine.  MEDICATIONS:  Current Outpatient Medications  Medication Sig Dispense Refill  . carboxymethylcellulose (REFRESH PLUS) 0.5 % SOLN Place 1 drop into both eyes 3 (three) times daily as needed (Dry and itchy eyes).    . cholecalciferol (VITAMIN D) 1000 UNITS tablet Take 2,000 Units by mouth daily.     . fentaNYL (DURAGESIC - DOSED MCG/HR) 25 MCG/HR patch Place 1 patch (25 mcg total) onto the skin every 3 (three) days. 10 patch 0  . furosemide (LASIX) 40 MG tablet Take 2 tablets (80 mg total) by mouth daily. 60 tablet 3  . gabapentin (NEURONTIN) 100 MG capsule Take 100 mg by mouth at bedtime.    . hydrALAZINE (APRESOLINE) 50 MG tablet Take 1.5 tablets (75 mg total) by mouth 3 (three) times daily. 135 tablet 3  . hydroxyurea (HYDREA) 500 MG capsule TAKE 2 CAPSULES ON MON. , WED. AND FRI.[name] Kitchen 1 CAPSULE ALL OTHER DAYS OF THE WEEK. 90 capsule 0  . levothyroxine (SYNTHROID, LEVOTHROID) 100 MCG tablet TAKE 1 TABLET ONCE DAILY BEFORE BREAKFAST. 30 tablet 0  . Magnesium 250 MG TABS Take 250 mg by mouth daily.     . Melatonin 10 MG TABS Take 10 mg by mouth at bedtime.    [name] Kitchen  Multiple Vitamins-Minerals (PRESERVISION AREDS 2) CAPS Take 2 capsules by mouth daily.     . polycarbophil (FIBERCON) 625 MG tablet Take 2 tablets (1,250 mg total) by mouth 2 (two) times daily. 60 tablet 0  . potassium chloride (K-DUR) 10 MEQ tablet TAKE 1 TABLET BY MOUTH TWICE DAILY. 60 tablet 0  . potassium chloride (K-DUR,KLOR-CON) 10  MEQ tablet Take 10 mEq by mouth daily.     . traMADol (ULTRAM) 50 MG tablet TAKE ONE TABLET AT BEDTIME. 30 tablet 0  . XARELTO 15 MG TABS tablet TAKE 1 TABLET ONCE DAILY. 30 tablet 3   No current facility-administered medications for this visit.     PHYSICAL EXAMINATION: ECOG PERFORMANCE STATUS: 0 - Asymptomatic  Vitals:   05/10/17 1423  BP: (!) 113/53  Pulse: 75  Resp: 18  Temp: (!) 97.4 F (36.3 C)  SpO2: 98%   Filed Weights   05/10/17 1423  Weight: 106 lb 1.6 oz (48.1 kg)    GENERAL:alert, no distress and comfortable NEURO: alert & oriented x 3 with fluent speech, no focal motor/sensory deficits  LABORATORY DATA:  I have reviewed the data as listed    Component Value Date/Time   NA 138 02/17/2017   NA 136 09/14/2016 1428   K 4.3 02/17/2017   K 4.0 09/14/2016 1428   CL 97 (L) 02/07/2017 2305   CO2 27 02/07/2017 2305   CO2 30 (H) 09/14/2016 1428   GLUCOSE 117 (H) 02/07/2017 2305   GLUCOSE 112 09/14/2016 1428   BUN 12 02/17/2017   BUN 14.7 09/14/2016 1428   CREATININE 0.6 02/17/2017   CREATININE 0.80 02/07/2017 2305   CREATININE 0.8 09/14/2016 1428   CALCIUM 10.7 (H) 02/07/2017 2305   CALCIUM 11.0 (H) 09/14/2016 1428   PROT 6.6 02/07/2017 2305   PROT 7.0 09/14/2016 1428   ALBUMIN 4.7 02/07/2017 2305   ALBUMIN 4.6 09/14/2016 1428   AST 21 02/07/2017 2305   AST 17 09/14/2016 1428   ALT 16 02/07/2017 2305   ALT 18 09/14/2016 1428   ALKPHOS 59 02/07/2017 2305   ALKPHOS 62 09/14/2016 1428   BILITOT 0.9 02/07/2017 2305   BILITOT 1.00 09/14/2016 1428   GFRNONAA >60 02/07/2017 2305   GFRAA >60 02/07/2017 2305    No results found for: SPEP, UPEP  Lab Results  Component Value Date   WBC 8.1 05/10/2017   NEUTROABS 6.4 05/10/2017   HGB 13.5 05/10/2017   HCT 42.2 05/10/2017   MCV 107.3 (H) 05/10/2017   PLT 454 (H) 05/10/2017      Chemistry      Component Value Date/Time   NA 138 02/17/2017   NA 136 09/14/2016 1428   K 4.3 02/17/2017   K 4.0  09/14/2016 1428   CL 97 (L) 02/07/2017 2305   CO2 27 02/07/2017 2305   CO2 30 (H) 09/14/2016 1428   BUN 12 02/17/2017   BUN 14.7 09/14/2016 1428   CREATININE 0.6 02/17/2017   CREATININE 0.80 02/07/2017 2305   CREATININE 0.8 09/14/2016 1428   GLU 79 02/17/2017      Component Value Date/Time   CALCIUM 10.7 (H) 02/07/2017 2305   CALCIUM 11.0 (H) 09/14/2016 1428   ALKPHOS 59 02/07/2017 2305   ALKPHOS 62 09/14/2016 1428   AST 21 02/07/2017 2305   AST 17 09/14/2016 1428   ALT 16 02/07/2017 2305   ALT 18 09/14/2016 1428   BILITOT 0.9 02/07/2017 2305   BILITOT 1.00 09/14/2016 1428       RADIOGRAPHIC  STUDIES: I have personally reviewed the radiological images as listed and agreed with the findings in the report. [place] [employer] Structures Up Right  Result Date: 05/08/2017 No images saved    All questions were answered. The patient knows to call the clinic with any problems, questions or concerns. No barriers to learning was detected.  I spent 10 minutes counseling the patient face to face. The total time spent in the appointment was 15 minutes and more than 50% was on counseling and review of test results  Heath Lark, MD 05/10/2017 3:09 PM

## 2017-05-10 NOTE — Assessment & Plan Note (Signed)
Her CBC is improving The patient stated she has been compliant taking medications as directed She would take 1000 mg on Mondays, Wednesdays and Fridays and to take 500 mg for the rest of the week I suspect a mild elevated platelet count could be due to component of recent bleeding and mild iron deficiency, slightly improved compared to before I plan to see her back in 6 months for close follow-up

## 2017-05-10 NOTE — Telephone Encounter (Signed)
Gave avs and calendar ° °

## 2017-05-12 ENCOUNTER — Other Ambulatory Visit: Payer: Self-pay | Admitting: *Deleted

## 2017-05-12 MED ORDER — FENTANYL 25 MCG/HR TD PT72
25.0000 ug | MEDICATED_PATCH | TRANSDERMAL | 0 refills | Status: DC
Start: 2017-05-12 — End: 2017-06-10

## 2017-05-12 NOTE — Telephone Encounter (Signed)
Patient requested Narcotic Refill [place] Verified LR: 04/12/17 Pharmacy Confirmed Pended Rx and sent to Dr. Mariea Clonts for approval.

## 2017-05-16 ENCOUNTER — Other Ambulatory Visit: Payer: Self-pay | Admitting: Internal Medicine

## 2017-05-17 DIAGNOSIS — H353211 Exudative age-related macular degeneration, right eye, with active choroidal neovascularization: Secondary | ICD-10-CM | POA: Diagnosis not present

## 2017-05-17 DIAGNOSIS — H353222 Exudative age-related macular degeneration, left eye, with inactive choroidal neovascularization: Secondary | ICD-10-CM | POA: Diagnosis not present

## 2017-05-17 DIAGNOSIS — H353221 Exudative age-related macular degeneration, left eye, with active choroidal neovascularization: Secondary | ICD-10-CM | POA: Diagnosis not present

## 2017-05-17 DIAGNOSIS — H353124 Nonexudative age-related macular degeneration, left eye, advanced atrophic with subfoveal involvement: Secondary | ICD-10-CM | POA: Diagnosis not present

## 2017-05-19 ENCOUNTER — Other Ambulatory Visit: Payer: Self-pay | Admitting: Internal Medicine

## 2017-05-19 DIAGNOSIS — M545 Low back pain, unspecified: Secondary | ICD-10-CM

## 2017-05-20 DIAGNOSIS — M47816 Spondylosis without myelopathy or radiculopathy, lumbar region: Secondary | ICD-10-CM | POA: Diagnosis not present

## 2017-06-01 ENCOUNTER — Other Ambulatory Visit (HOSPITAL_COMMUNITY): Payer: Self-pay | Admitting: Cardiology

## 2017-06-02 ENCOUNTER — Other Ambulatory Visit: Payer: Self-pay | Admitting: Internal Medicine

## 2017-06-02 DIAGNOSIS — M47816 Spondylosis without myelopathy or radiculopathy, lumbar region: Secondary | ICD-10-CM | POA: Diagnosis not present

## 2017-06-03 ENCOUNTER — Other Ambulatory Visit: Payer: Self-pay | Admitting: Hematology and Oncology

## 2017-06-10 ENCOUNTER — Encounter: Payer: Self-pay | Admitting: Nurse Practitioner

## 2017-06-10 ENCOUNTER — Other Ambulatory Visit: Payer: Self-pay | Admitting: *Deleted

## 2017-06-10 ENCOUNTER — Ambulatory Visit (INDEPENDENT_AMBULATORY_CARE_PROVIDER_SITE_OTHER): Payer: Medicare Other | Admitting: Nurse Practitioner

## 2017-06-10 VITALS — BP 120/78 | HR 81 | Temp 98.2°F | Ht 66.0 in | Wt 107.0 lb

## 2017-06-10 DIAGNOSIS — R1011 Right upper quadrant pain: Secondary | ICD-10-CM

## 2017-06-10 MED ORDER — FENTANYL 25 MCG/HR TD PT72: 25.0000 ug | MEDICATED_PATCH | TRANSDERMAL | 0 refills | Status: DC

## 2017-06-10 NOTE — Progress Notes (Signed)
Careteam: Patient Care Team: Gayland Curry, DO as PCP - General (Geriatric Medicine) Larey Dresser, MD as PCP - Cardiology (Cardiology) Community, [facility], Clent Demark, MD as Consulting Physician (Ophthalmology) Larey Dresser, MD as Consulting Physician (Cardiology) Heath Lark, MD as Consulting Physician (Hematology and Oncology) Clent Jacks, MD as Consulting Physician (Ophthalmology) Jerrell Belfast, MD as Consulting Physician (Otolaryngology)  Advanced Directive information Does Patient Have a Medical Advance Directive?: Yes, Type of Advance Directive: Living will  Allergies  Allergen Reactions  . Amlodipine Swelling and Other (See Comments)    Reaction:  Lower extremity swelling     Chief Complaint  Patient presents with  . Acute Visit    Pt is being seen due to right abdominal pain a few days ago but [facility] nurse thought it needed to be checked out.      HPI: Patient is a 84 y.o. female seen in the office today due to pain in right abdomen area that lasted a few hours but resolved 2 days ago. Has not had any recurrent pain since. Reports hx of gallstones many years ago that she had evaluated but the surgeon did not wish to do any intervention at that time and has not had any problems since. Reports pain in right upper abdomen and lower abdomen. Reports it very uncomfortable but unable to describe pain. Did not radiate. Reports it was similar to gas pain but generally does not last that long. Pain was around a 4-5 at the most which did not last long.  No pain with urination.  Takes fiber supplement to help bowels move and they are regular.  No fevers or chills.  No nausea or vomiting.   Reports she was told in the past she has a lower right abdominal hernia, this does not bother her.  Review of Systems:  Review of Systems  Constitutional: Negative for chills, fever and malaise/fatigue.  HENT: Positive for hearing loss.   Eyes: Negative  for blurred vision.  Respiratory: Negative for cough and shortness of breath.   Cardiovascular: Negative for chest pain, palpitations and leg swelling.  Gastrointestinal: Negative for abdominal pain, blood in stool, constipation, diarrhea, heartburn, melena, nausea and vomiting.  Genitourinary: Negative for dysuria.  Musculoskeletal: Positive for back pain. Negative for falls and joint pain.  Skin: Negative for itching and rash.  Neurological: Negative for dizziness, loss of consciousness and weakness.  Endo/Heme/Allergies: Does not bruise/bleed easily.       Hypothyroid  Psychiatric/Behavioral: Negative for depression and memory loss. The patient is not nervous/anxious and does not have insomnia.     Past Medical History:  Diagnosis Date  . Abnormal stress test    a. 11/2011 Ex MV: EF 80%, small, partially reversible anteroapical defect->mild ischemia vs attenuation-->med Rx.  [name] Kitchen Alopecia, unspecified   . Arthritis   . Chronic diastolic CHF (congestive heart failure) ([place])    a. 11/2013 Echo: EF 60-65%, no rwma, mild TR, PASP 1mmHg.  . Closed fracture of lower end of radius with ulna   . Deafness    Left, s/p multiple surgeries  . Essential hypertension   . Gouty arthropathy, unspecified   . History of kidney stones   . Hypothyroidism   . Insomnia, unspecified   . Macular degeneration    Left, s/p inj. tx  . Mitral valve disorders(424.0)   . Neck mass    right, w/u including MRI negative  . PAF (paroxysmal atrial fibrillation) ([place])    a.  on amio (02/2012 mild obstruction on PFT's) and xarelto.  . Polycythemia vera(238.4)   . PONV (postoperative nausea and vomiting)    no problem last surgery  . Pure hypercholesterolemia   . Senile osteoporosis    Reclast in the past  . Tricuspid valve disorders, specified as nonrheumatic    Past Surgical History:  Procedure Laterality Date  . ABLATION  08/28/14   Back   . BREAST BIOPSY  06/18/1998   left  . BREAST BIOPSY Right   .  ELECTROPHYSIOLOGIC STUDY N/A 01/08/2016   Procedure: Atrial Fibrillation Ablation;  Surgeon: Thompson Grayer, MD;  Location: [facility];  Service: Cardiovascular;  Laterality: N/A;  . FEMUR IM NAIL Right 10/08/2015   Procedure: INTRAMEDULLARY (IM) NAIL FEMORAL RIGHT;  Surgeon: Paralee Cancel, MD;  Location: [facility];  Service: Orthopedics;  Laterality: Right;  . MASS EXCISION Right 11/26/2016   Procedure: EXCISION RIGHT LATERAL NECK MASS;  Surgeon: Jerrell Belfast, MD;  Location: [facility];  Service: ENT;  Laterality: Right;  . TONSILLECTOMY    . TONSILLECTOMY    . TOTAL ABDOMINAL HYSTERECTOMY    . TYMPANOPLASTY Bilateral    Social History:   reports that she quit smoking about 33 years ago. Her smoking use included cigarettes. She has never used smokeless tobacco. She reports that she drinks about 0.6 oz of alcohol per week. She reports that she does not use drugs.  Family History  Problem Relation Age of Onset  . Hypertension Father   . Heart disease Father        patient does not know details.   . Ovarian cancer Mother 33  . Cancer Sister        colon  . Heart disease Sister   . Cancer Sister   . Cancer Sister        hodgkin disease  . Breast cancer Daughter   . Cancer Daughter        breast    Medications: Patient's Medications  New Prescriptions   No medications on file  Previous Medications   CARBOXYMETHYLCELLULOSE (REFRESH PLUS) 0.5 % SOLN    Place 1 drop into both eyes 3 (three) times daily as needed (Dry and itchy eyes).   CHOLECALCIFEROL (VITAMIN D) 1000 UNITS TABLET    Take 2,000 Units by mouth daily.    FENTANYL (DURAGESIC - DOSED MCG/HR) 25 MCG/HR PATCH    Place 1 patch (25 mcg total) onto the skin every 3 (three) days.   FUROSEMIDE (LASIX) 40 MG TABLET    Take 2 tablets (80 mg total) by mouth daily.   GABAPENTIN (NEURONTIN) 100 MG CAPSULE    Take 100 mg by mouth at bedtime.   HYDRALAZINE (APRESOLINE) 50 MG TABLET    Take 1.5 tablets (75 mg total) by mouth 3 (three)  times daily.   HYDROXYUREA (HYDREA) 500 MG CAPSULE    TAKE 2 CAPSULES ON MON. , WED. AND FRI.[name] Kitchen 1 CAPSULE ALL OTHER DAYS OF THE WEEK.   LEVOTHYROXINE (SYNTHROID, LEVOTHROID) 100 MCG TABLET    TAKE 1 TABLET ONCE DAILY BEFORE BREAKFAST.   MAGNESIUM 250 MG TABS    Take 250 mg by mouth daily.    MELATONIN 10 MG TABS    Take 10 mg by mouth at bedtime.   MULTIPLE VITAMINS-MINERALS (PRESERVISION AREDS 2) CAPS    Take 2 capsules by mouth daily.    POLYCARBOPHIL (FIBERCON) 625 MG TABLET    Take 2 tablets (1,250 mg total) by mouth 2 (two) times daily.  POTASSIUM CHLORIDE (K-DUR) 10 MEQ TABLET    TAKE 1 TABLET BY MOUTH TWICE DAILY.   TRAMADOL (ULTRAM) 50 MG TABLET    Take 1 tablet (50 mg total) by mouth at bedtime.   XARELTO 15 MG TABS TABLET    TAKE 1 TABLET ONCE DAILY.  Modified Medications   No medications on file  Discontinued Medications   POTASSIUM CHLORIDE (K-DUR,KLOR-CON) 10 MEQ TABLET    Take 10 mEq by mouth daily.      Physical Exam:  Vitals:   06/10/17 1133  BP: 120/78  Pulse: 81  Temp: 98.2 F (36.8 C)  TempSrc: Oral  SpO2: 95%  Weight: 107 lb (48.5 kg)  Height: 5\' 6"  (1.676 m)   Body mass index is 17.27 kg/m.  Physical Exam  Constitutional: She is oriented to person, place, and time. She appears well-developed and well-nourished. No distress.  HENT:  Head: Normocephalic and atraumatic.  Cardiovascular: Intact distal pulses.  irreg irreg  Pulmonary/Chest: Effort normal and breath sounds normal. No respiratory distress. She has no rales.  Abdominal: Soft. Bowel sounds are normal. She exhibits no distension and no mass. There is no tenderness. There is no rebound and no guarding. A hernia is present. Hernia confirmed positive in the right inguinal area (soft, nontender).  Musculoskeletal: Normal range of motion.  Walks with rollator walker  Neurological: She is alert and oriented to person, place, and time.  Skin: Skin is warm and dry. Capillary refill takes less than 2  seconds.  Psychiatric: She has a normal mood and affect.    Labs reviewed: Basic Metabolic Panel: Recent Labs    09/14/16 1428 11/23/16 1139 12/27/16 1427 02/07/17 2305 02/17/17  NA 136 133*  --  133* 138  K 4.0 4.5  --  3.8 4.3  CL  --  98*  --  97*  --   CO2 30* 28  --  27  --   GLUCOSE 112 93  --  117*  --   BUN 14.7 12  --  26* 12  CREATININE 0.8 0.62  --  0.80 0.6  CALCIUM 11.0* 10.7*  --  10.7*  --   TSH  --   --  0.194*  --   --    Liver Function Tests: Recent Labs    09/14/16 1428 02/07/17 2305  AST 17 21  ALT 18 16  ALKPHOS 62 59  BILITOT 1.00 0.9  PROT 7.0 6.6  ALBUMIN 4.6 4.7   No results for input(s): LIPASE, AMYLASE in the last 8760 hours. No results for input(s): AMMONIA in the last 8760 hours. CBC: Recent Labs    02/24/17 1412 03/15/17 1406 05/10/17 1406  WBC 6.7 5.9 8.1  NEUTROABS 5.1 4.3 6.4  HGB 11.1* 11.1* 13.5  HCT 34.6* 33.7* 42.2  MCV 113.8* 109.5* 107.3*  PLT 541* 478* 454*   Lipid Panel: No results for input(s): CHOL, HDL, LDLCALC, TRIG, CHOLHDL, LDLDIRECT in the last 8760 hours. TSH: Recent Labs    12/27/16 1427  TSH 0.194*   A1C: No results found for: HGBA1C   Assessment/Plan 1. Right upper quadrant pain Resolved. Pt reports hx of gallstones several years ago but has not had recurrent episodes.  Will continue to monitor and notify if symptoms recur.   Next appt: 08/03/2017 Carlos American. [place], [facility] [phone]

## 2017-06-10 NOTE — Telephone Encounter (Signed)
Patient requested [place] Verified LR: 05/12/2017 Pharmacy Confirmed Pended Rx and sent to Dr. Mariea Clonts for approval.

## 2017-06-14 DIAGNOSIS — R040 Epistaxis: Secondary | ICD-10-CM | POA: Diagnosis not present

## 2017-06-15 DIAGNOSIS — Z7901 Long term (current) use of anticoagulants: Secondary | ICD-10-CM | POA: Diagnosis not present

## 2017-06-15 DIAGNOSIS — R040 Epistaxis: Secondary | ICD-10-CM | POA: Diagnosis not present

## 2017-06-16 ENCOUNTER — Other Ambulatory Visit: Payer: Self-pay | Admitting: Internal Medicine

## 2017-06-17 DIAGNOSIS — M47816 Spondylosis without myelopathy or radiculopathy, lumbar region: Secondary | ICD-10-CM | POA: Diagnosis not present

## 2017-06-21 ENCOUNTER — Other Ambulatory Visit: Payer: Self-pay | Admitting: Internal Medicine

## 2017-06-21 DIAGNOSIS — H353221 Exudative age-related macular degeneration, left eye, with active choroidal neovascularization: Secondary | ICD-10-CM | POA: Diagnosis not present

## 2017-06-21 DIAGNOSIS — H353124 Nonexudative age-related macular degeneration, left eye, advanced atrophic with subfoveal involvement: Secondary | ICD-10-CM | POA: Diagnosis not present

## 2017-06-21 DIAGNOSIS — H353222 Exudative age-related macular degeneration, left eye, with inactive choroidal neovascularization: Secondary | ICD-10-CM | POA: Diagnosis not present

## 2017-06-21 DIAGNOSIS — H353211 Exudative age-related macular degeneration, right eye, with active choroidal neovascularization: Secondary | ICD-10-CM | POA: Diagnosis not present

## 2017-06-21 DIAGNOSIS — M545 Low back pain, unspecified: Secondary | ICD-10-CM

## 2017-06-21 NOTE — Telephone Encounter (Signed)
A medication refill was received from pharmacy for tramadol 50 mg. Rx was called in to pharmacy after verifying last fill date, provider, and quantity on PMP [place] database.   

## 2017-06-22 DIAGNOSIS — S0501XA Injury of conjunctiva and corneal abrasion without foreign body, right eye, initial encounter: Secondary | ICD-10-CM | POA: Diagnosis not present

## 2017-06-30 ENCOUNTER — Other Ambulatory Visit: Payer: Self-pay | Admitting: Internal Medicine

## 2017-06-30 MED ORDER — LEVOTHYROXINE SODIUM 100 MCG PO TABS: 100.0000 ug | ORAL_TABLET | Freq: Every day | ORAL | 0 refills | Status: DC

## 2017-06-30 NOTE — Addendum Note (Signed)
Addended by: Despina Hidden on: 06/30/2017 10:45 AM   Modules accepted: Orders

## 2017-06-30 NOTE — Telephone Encounter (Addendum)
Please advise, no recent TSH Last TSH (hospital encounter) 01/05/18 was abnormal at [facility] can you check [facility] lab EMR system and see if patient with more recent TSH value, if yes can you abstract the TSH value in [facility] for future refill request

## 2017-07-01 DIAGNOSIS — M47816 Spondylosis without myelopathy or radiculopathy, lumbar region: Secondary | ICD-10-CM | POA: Diagnosis not present

## 2017-07-07 ENCOUNTER — Encounter (HOSPITAL_COMMUNITY): Payer: Self-pay

## 2017-07-07 ENCOUNTER — Encounter (HOSPITAL_COMMUNITY): Payer: Self-pay | Admitting: Cardiology

## 2017-07-07 ENCOUNTER — Ambulatory Visit (HOSPITAL_COMMUNITY)
Admission: RE | Admit: 2017-07-07 | Discharge: 2017-07-07 | Disposition: A | Discharge status: Home / Self Care | Payer: Medicare Other | Source: Ambulatory Visit | Attending: Cardiology | Admitting: Cardiology

## 2017-07-07 VITALS — BP 108/64 | HR 107 | Wt 107.8 lb

## 2017-07-07 DIAGNOSIS — Z8249 Family history of ischemic heart disease and other diseases of the circulatory system: Secondary | ICD-10-CM | POA: Diagnosis not present

## 2017-07-07 DIAGNOSIS — I4510 Unspecified right bundle-branch block: Secondary | ICD-10-CM | POA: Insufficient documentation

## 2017-07-07 DIAGNOSIS — D45 Polycythemia vera: Secondary | ICD-10-CM | POA: Insufficient documentation

## 2017-07-07 DIAGNOSIS — E039 Hypothyroidism, unspecified: Secondary | ICD-10-CM | POA: Diagnosis not present

## 2017-07-07 DIAGNOSIS — I110 Hypertensive heart disease with heart failure: Secondary | ICD-10-CM | POA: Insufficient documentation

## 2017-07-07 DIAGNOSIS — Z90710 Acquired absence of both cervix and uterus: Secondary | ICD-10-CM | POA: Diagnosis not present

## 2017-07-07 DIAGNOSIS — Z9889 Other specified postprocedural states: Secondary | ICD-10-CM | POA: Insufficient documentation

## 2017-07-07 DIAGNOSIS — I495 Sick sinus syndrome: Secondary | ICD-10-CM | POA: Insufficient documentation

## 2017-07-07 DIAGNOSIS — Z79890 Hormone replacement therapy (postmenopausal): Secondary | ICD-10-CM | POA: Insufficient documentation

## 2017-07-07 DIAGNOSIS — I5033 Acute on chronic diastolic (congestive) heart failure: Secondary | ICD-10-CM | POA: Insufficient documentation

## 2017-07-07 DIAGNOSIS — Z87891 Personal history of nicotine dependence: Secondary | ICD-10-CM | POA: Diagnosis not present

## 2017-07-07 DIAGNOSIS — I5032 Chronic diastolic (congestive) heart failure: Secondary | ICD-10-CM | POA: Diagnosis not present

## 2017-07-07 DIAGNOSIS — I480 Paroxysmal atrial fibrillation: Secondary | ICD-10-CM

## 2017-07-07 DIAGNOSIS — Z7901 Long term (current) use of anticoagulants: Secondary | ICD-10-CM | POA: Diagnosis not present

## 2017-07-07 DIAGNOSIS — Z79899 Other long term (current) drug therapy: Secondary | ICD-10-CM | POA: Insufficient documentation

## 2017-07-07 LAB — BASIC METABOLIC PANEL
Anion gap: 6 (ref 5–15)
BUN: 16 mg/dL (ref 6–20)
CHLORIDE: 102 mmol/L (ref 101–111)
CO2: 30 mmol/L (ref 22–32)
CREATININE: 0.82 mg/dL (ref 0.44–1.00)
Calcium: 10.7 mg/dL — ABNORMAL HIGH (ref 8.9–10.3)
GFR calc Af Amer: >60 mL/min (ref >60)
GFR calc non Af Amer: >60 mL/min (ref >60)
Glucose, Bld: 88 mg/dL (ref 65–99)
POTASSIUM: 3.8 mmol/L (ref 3.5–5.1)
Sodium: 138 mmol/L (ref 135–145)

## 2017-07-07 LAB — PROTIME-INR
INR: 1.12
Prothrombin Time: 14.4 seconds (ref 11.4–15.2)

## 2017-07-07 LAB — CBC
HEMATOCRIT: 36.3 % (ref 36.0–46.0)
Hemoglobin: 11.6 g/dL — ABNORMAL LOW (ref 12.0–15.0)
MCH: 35.8 pg — AB (ref 26.0–34.0)
MCHC: 32 g/dL (ref 30.0–36.0)
MCV: 112 fL — ABNORMAL HIGH (ref 78.0–100.0)
PLATELETS: 535 10*3/uL — AB (ref 150–400)
RBC: 3.24 MIL/uL — ABNORMAL LOW (ref 3.87–5.11)
RDW: 16.5 % — AB (ref 11.5–15.5)
WBC: 8.1 10*3/uL (ref 4.0–10.5)

## 2017-07-07 NOTE — Patient Instructions (Signed)
Your physician has recommended that you have a Cardioversion (DCCV). Electrical Cardioversion uses a jolt of electricity to your heart either through paddles or wired patches attached to your chest. This is a controlled, usually prescheduled, procedure. Defibrillation is done under light anesthesia in the hospital, and you usually go home the day of the procedure. This is done to get your heart back into a normal rhythm. You are not awake for the procedure. Please see the instruction sheet given to you today.  Labs drawn today (if we do not call you, then your lab work was stable)   Your physician recommends that you schedule a follow-up appointment in: 3 weeks in A-fib clinic  Your physician recommends that you schedule a follow-up appointment in: 3 months with Dr. Aundra Dubin

## 2017-07-09 NOTE — Progress Notes (Signed)
Patient ID: Sarah Mays, female   DOB: 07/18/1930, 84 y.o.   MRN: [id number] PCP: Dr Hollace Kinnier Cardiology: Dr. Aundra Dubin  84 yo with paroxysmal atrial fibrillation and chronic diastolic CHF returns for cardiology evaluation.  In 7/16, she was admitted with chest pain.  Cardiolite showed no ischemia or infarction.  Echo in 6/17 showed EF 60-65%.  She developed problems elevated HR when in atrial fibrillation but bradycardia when in NSR.  She underwent atrial fibrillation ablation in 11/17.  She is tolerating Xarelto without melena or BRBPR.  Dr. Rayann Heman had her stop amiodarone.    Echo in 11/18 showed EF 60-65%, PASP 47 mmHg.   She presents today for regular follow up of CHF and atrial fibrillation. Today, she is noted to be in atrial fibrillation again.  She reports increased fatigue over the last couple of weeks.  She is short of breath walking 75-100 yards.  She walks with a walker.  No orthopnea/PND.  No chest pain.  She takes Xarelto most of the time but missed 2 days of Xarelto a week ago because of epistaxis.   ECG: atrial fibrillation rate 95, right axis deviation (personally reviewed).   Labs (7/16): K 4, creatinine 0.94, calcium 10.8, LFTs normal Labs (9/16): K 4, creatinine 0.9, LDL 69, TSH normal, HCT 42.3 Labs (5/17): K 4.6, creatinine 0.94, HCT 41.5, LFTs normal. Labs (6/17): K 5, creatinine 1.04, LDL 42, HDL 101, TSH normal Labs (12/17): K 3.7, creatinine 0.76, hgb 11.2 Labs (4/18): K 4.6, creatinine 0.91 Labs (10/18): K 4.5, creatinine 0.62 Labs (12/18): hgb 13.5 Labs (1/19): K 4.3, creatinine 0.6 Labs (4/19): hgb 13.5  PMH: 1. Atrial fibrillation: Diagnosed initially in 10/13.  Holter monitor in 11/13 showed atrial fibrillation with average rate 65.  - Atrial fibrillation ablation in 11/17.  2. Chronic diastolic CHF: Echo (46/96) with EF 65-70%, mild MR, moderate biatrial enlargement, moderate TR, PA systolic pressure 45 mmHg.   Echo (10/15): EF 60-65%.  Echo (7/16) with  EF 60-65%, mild MR.  - Echo (6/17): EF 60-65%, PASP 41 mmHg.  - Echo (11/18): EF 60-65%, PASP 47 mmHg 3. ETT-Sestamibi (11/13): Exercised to stage II, small partially reversible anteroapical perfusion defect suggesting mild ischemia versus attenuation, EF 80%.  Cardiolite (7/16) with EF 64%, no ischemia or infarction.  4. HTN: Lower extremity swelling with amlodipine.  5. Polycythemia vera: Has had phlebotomy only once.  6. Hypothyroidism 7. Macular degeneration 8. TAH 1980 9. Familial hypocalciuric hypercalcemia 10. PFTs (amiodarone use) in 1/14 showed a mild obstructive defect.  11. Degenerative disc disease 12. Sick sinus syndrome: Has not required PPM.   SH: Prior smoker (years ago).  Widowed, lived in [place] but now at [place]  Occasional ETOH.  Daughter works for [employer].   FH: CAD  Review of systems complete and found to be negative unless listed in HPI.    Current Outpatient Medications  Medication Sig Dispense Refill  . carboxymethylcellulose (REFRESH PLUS) 0.5 % SOLN Place 1 drop into both eyes 3 (three) times daily as needed (Dry and itchy eyes).    . cholecalciferol (VITAMIN D) 1000 UNITS tablet Take 2,000 Units by mouth daily.     . fentaNYL (DURAGESIC - DOSED MCG/HR) 25 MCG/HR patch Place 1 patch (25 mcg total) onto the skin every 3 (three) days. 10 patch 0  . furosemide (LASIX) 40 MG tablet Take 2 tablets (80 mg total) by mouth daily. 60 tablet 3  . gabapentin (NEURONTIN) 100 MG capsule Take 100 mg by  mouth at bedtime.    . hydrALAZINE (APRESOLINE) 50 MG tablet Take 1.5 tablets (75 mg total) by mouth 3 (three) times daily. 135 tablet 3  . hydroxyurea (HYDREA) 500 MG capsule TAKE 2 CAPSULES ON MON. , WED. AND FRI.[name] Kitchen 1 CAPSULE ALL OTHER DAYS OF THE WEEK. 90 capsule 0  . levothyroxine (SYNTHROID, LEVOTHROID) 100 MCG tablet Take 1 tablet (100 mcg total) by mouth daily before breakfast. 90 tablet 0  . Magnesium 250 MG TABS Take 250 mg by mouth daily.     . Melatonin  10 MG TABS Take 10 mg by mouth at bedtime.    . Multiple Vitamins-Minerals (PRESERVISION AREDS 2) CAPS Take 2 capsules by mouth daily.     . polycarbophil (FIBERCON) 625 MG tablet Take 2 tablets (1,250 mg total) by mouth 2 (two) times daily. 60 tablet 0  . potassium chloride (K-DUR) 10 MEQ tablet TAKE 1 TABLET BY MOUTH TWICE DAILY. 60 tablet 0  . traMADol (ULTRAM) 50 MG tablet TAKE ONE TABLET AT BEDTIME. 30 tablet 0  . XARELTO 15 MG TABS tablet TAKE 1 TABLET ONCE DAILY. 30 tablet 3   No current facility-administered medications for this encounter.    Vitals:   07/07/17 1430  BP: 108/64  Pulse: (!) 107  SpO2: 94%  Weight: 107 lb 12.8 oz (48.9 kg)    Wt Readings from Last 3 Encounters:  07/07/17 107 lb 12.8 oz (48.9 kg)  06/10/17 107 lb (48.5 kg)  05/10/17 106 lb 1.6 oz (48.1 kg)   Physical Exam General: NAD Neck: No JVD, no thyromegaly or thyroid nodule.  Lungs: Clear to auscultation bilaterally with normal respiratory effort. CV: Nondisplaced PMI.  Heart irregular S1/S2, no S3/S4, no murmur.  No peripheral edema.  No carotid bruit.  Normal pedal pulses.  Abdomen: Soft, nontender, no hepatosplenomegaly, no distention.  Skin: Intact without lesions or rashes.  Neurologic: Alert and oriented x 3.  Psych: Normal affect. Extremities: No clubbing or cyanosis.  HEENT: Normal.   Assessment/Plan:  1. Atrial fibrillation: First noted in 10/13.  Atrial fibrillation has triggered acute on chronic diastolic CHF in the past.  CHADSVASC score is 38 (age, female gender, HTN, CHF).  She is anticoagulated with Xarelto 15 mg daily which is appropriate for her creatinine clearance.  She had atrial fibrillation ablation in 11/17.  She is now off amiodarone.  She has had epistaxis recently and missed 2 days of Xarelto about a week ago.  Today, she is back in atrial fibrillation.  Based on increased fatigue, I suspect she has been in atrial fibrillation for at least a week or two.  - She is not on nodal  blockers due to h/o sick sinus syndrome. HR is not significantly elevated today.  - Continue Xarelto.   - She tolerates atrial fibrillation poorly.  She has missed a couple of recent days of Xarelto. I am going to arrange for TEE-guided DCCV to try to get her back in NSR.  We discussed risks/benefits and she agrees to the procedure.  2. Chronic diastolic CHF:  NYHA class II symptoms currently. Volume status stable on current Lasix dosing.  - Continue lasix 80 mg daily. BMET today.  - Continue KCl 102mEq bid.  - Pt aware that she can take an extra 40mg  of Lasix if her weight goes up by 3 pounds overnight.    3. HTN: BP reasonably controlled, continue current meds.     4. Bradycardia: Suspect she has a degree of sick sinus syndrome.  This has been stable off nodal blocking agents.  5. Hypercalcemia: Mild.  Has diagnosis of familial hypocalciuric hypercalcemia.   Followup in 3 wks in afib clinic.  See me in 3 months.   Loralie Champagne 07/09/2017

## 2017-07-09 NOTE — H&P (View-Only) (Signed)
Patient ID: Sarah Mays, female   DOB: 12-23-1930, 84 y.o.   MRN: [id number] PCP: Dr Hollace Kinnier Cardiology: Dr. Aundra Dubin  84 yo with paroxysmal atrial fibrillation and chronic diastolic CHF returns for cardiology evaluation.  In 7/16, she was admitted with chest pain.  Cardiolite showed no ischemia or infarction.  Echo in 6/17 showed EF 60-65%.  She developed problems elevated HR when in atrial fibrillation but bradycardia when in NSR.  She underwent atrial fibrillation ablation in 11/17.  She is tolerating Xarelto without melena or BRBPR.  Dr. Rayann Heman had her stop amiodarone.    Echo in 11/18 showed EF 60-65%, PASP 47 mmHg.   She presents today for regular follow up of CHF and atrial fibrillation. Today, she is noted to be in atrial fibrillation again.  She reports increased fatigue over the last couple of weeks.  She is short of breath walking 75-100 yards.  She walks with a walker.  No orthopnea/PND.  No chest pain.  She takes Xarelto most of the time but missed 2 days of Xarelto a week ago because of epistaxis.   ECG: atrial fibrillation rate 95, right axis deviation (personally reviewed).   Labs (7/16): K 4, creatinine 0.94, calcium 10.8, LFTs normal Labs (9/16): K 4, creatinine 0.9, LDL 69, TSH normal, HCT 42.3 Labs (5/17): K 4.6, creatinine 0.94, HCT 41.5, LFTs normal. Labs (6/17): K 5, creatinine 1.04, LDL 42, HDL 101, TSH normal Labs (12/17): K 3.7, creatinine 0.76, hgb 11.2 Labs (4/18): K 4.6, creatinine 0.91 Labs (10/18): K 4.5, creatinine 0.62 Labs (12/18): hgb 13.5 Labs (1/19): K 4.3, creatinine 0.6 Labs (4/19): hgb 13.5  PMH: 1. Atrial fibrillation: Diagnosed initially in 10/13.  Holter monitor in 11/13 showed atrial fibrillation with average rate 65.  - Atrial fibrillation ablation in 11/17.  2. Chronic diastolic CHF: Echo (78/29) with EF 65-70%, mild MR, moderate biatrial enlargement, moderate TR, PA systolic pressure 45 mmHg.   Echo (10/15): EF 60-65%.  Echo (7/16) with  EF 60-65%, mild MR.  - Echo (6/17): EF 60-65%, PASP 41 mmHg.  - Echo (11/18): EF 60-65%, PASP 47 mmHg 3. ETT-Sestamibi (11/13): Exercised to stage II, small partially reversible anteroapical perfusion defect suggesting mild ischemia versus attenuation, EF 80%.  Cardiolite (7/16) with EF 64%, no ischemia or infarction.  4. HTN: Lower extremity swelling with amlodipine.  5. Polycythemia vera: Has had phlebotomy only once.  6. Hypothyroidism 7. Macular degeneration 8. TAH 1980 9. Familial hypocalciuric hypercalcemia 10. PFTs (amiodarone use) in 1/14 showed a mild obstructive defect.  11. Degenerative disc disease 12. Sick sinus syndrome: Has not required PPM.   SH: Prior smoker (years ago).  Widowed, lived in [place] but now at [place] [facility]  Occasional ETOH.  Daughter works for [employer].   FH: CAD  Review of systems complete and found to be negative unless listed in HPI.    Current Outpatient Medications  Medication Sig Dispense Refill  . carboxymethylcellulose (REFRESH PLUS) 0.5 % SOLN Place 1 drop into both eyes 3 (three) times daily as needed (Dry and itchy eyes).    . cholecalciferol (VITAMIN D) 1000 UNITS tablet Take 2,000 Units by mouth daily.     . fentaNYL (DURAGESIC - DOSED MCG/HR) 25 MCG/HR patch Place 1 patch (25 mcg total) onto the skin every 3 (three) days. 10 patch 0  . furosemide (LASIX) 40 MG tablet Take 2 tablets (80 mg total) by mouth daily. 60 tablet 3  . gabapentin (NEURONTIN) 100 MG capsule Take 100 mg by  mouth at bedtime.    . hydrALAZINE (APRESOLINE) 50 MG tablet Take 1.5 tablets (75 mg total) by mouth 3 (three) times daily. 135 tablet 3  . hydroxyurea (HYDREA) 500 MG capsule TAKE 2 CAPSULES ON MON. , WED. AND FRI.[name] Kitchen 1 CAPSULE ALL OTHER DAYS OF THE WEEK. 90 capsule 0  . levothyroxine (SYNTHROID, LEVOTHROID) 100 MCG tablet Take 1 tablet (100 mcg total) by mouth daily before breakfast. 90 tablet 0  . Magnesium 250 MG TABS Take 250 mg by mouth daily.     . Melatonin  10 MG TABS Take 10 mg by mouth at bedtime.    . Multiple Vitamins-Minerals (PRESERVISION AREDS 2) CAPS Take 2 capsules by mouth daily.     . polycarbophil (FIBERCON) 625 MG tablet Take 2 tablets (1,250 mg total) by mouth 2 (two) times daily. 60 tablet 0  . potassium chloride (K-DUR) 10 MEQ tablet TAKE 1 TABLET BY MOUTH TWICE DAILY. 60 tablet 0  . traMADol (ULTRAM) 50 MG tablet TAKE ONE TABLET AT BEDTIME. 30 tablet 0  . XARELTO 15 MG TABS tablet TAKE 1 TABLET ONCE DAILY. 30 tablet 3   No current facility-administered medications for this encounter.    Vitals:   07/07/17 1430  BP: 108/64  Pulse: (!) 107  SpO2: 94%  Weight: 107 lb 12.8 oz (48.9 kg)    Wt Readings from Last 3 Encounters:  07/07/17 107 lb 12.8 oz (48.9 kg)  06/10/17 107 lb (48.5 kg)  05/10/17 106 lb 1.6 oz (48.1 kg)   Physical Exam General: NAD Neck: No JVD, no thyromegaly or thyroid nodule.  Lungs: Clear to auscultation bilaterally with normal respiratory effort. CV: Nondisplaced PMI.  Heart irregular S1/S2, no S3/S4, no murmur.  No peripheral edema.  No carotid bruit.  Normal pedal pulses.  Abdomen: Soft, nontender, no hepatosplenomegaly, no distention.  Skin: Intact without lesions or rashes.  Neurologic: Alert and oriented x 3.  Psych: Normal affect. Extremities: No clubbing or cyanosis.  HEENT: Normal.   Assessment/Plan:  1. Atrial fibrillation: First noted in 10/13.  Atrial fibrillation has triggered acute on chronic diastolic CHF in the past.  CHADSVASC score is 3 (age, female gender, HTN, CHF).  She is anticoagulated with Xarelto 15 mg daily which is appropriate for her creatinine clearance.  She had atrial fibrillation ablation in 11/17.  She is now off amiodarone.  She has had epistaxis recently and missed 2 days of Xarelto about a week ago.  Today, she is back in atrial fibrillation.  Based on increased fatigue, I suspect she has been in atrial fibrillation for at least a week or two.  - She is not on nodal  blockers due to h/o sick sinus syndrome. HR is not significantly elevated today.  - Continue Xarelto.   - She tolerates atrial fibrillation poorly.  She has missed a couple of recent days of Xarelto. I am going to arrange for TEE-guided DCCV to try to get her back in NSR.  We discussed risks/benefits and she agrees to the procedure.  2. Chronic diastolic CHF:  NYHA class II symptoms currently. Volume status stable on current Lasix dosing.  - Continue lasix 80 mg daily. BMET today.  - Continue KCl 29mEq bid.  - Pt aware that she can take an extra 40mg  of Lasix if her weight goes up by 3 pounds overnight.    3. HTN: BP reasonably controlled, continue current meds.     4. Bradycardia: Suspect she has a degree of sick sinus syndrome.  This has been stable off nodal blocking agents.  5. Hypercalcemia: Mild.  Has diagnosis of familial hypocalciuric hypercalcemia.   Followup in 3 wks in afib clinic.  See me in 3 months.   Loralie Champagne 07/09/2017

## 2017-07-11 ENCOUNTER — Other Ambulatory Visit: Payer: Self-pay | Admitting: *Deleted

## 2017-07-11 MED ORDER — FENTANYL 25 MCG/HR TD PT72: 25.0000 ug | MEDICATED_PATCH | TRANSDERMAL | 0 refills | Status: DC

## 2017-07-11 NOTE — Telephone Encounter (Signed)
Patient requested [place] Verified LR: 06/10/17 Pharmacy Confirmed Pended Rx and sent to Dr. Mariea Clonts for approval.

## 2017-07-14 ENCOUNTER — Other Ambulatory Visit (HOSPITAL_COMMUNITY): Payer: Self-pay

## 2017-07-14 DIAGNOSIS — I480 Paroxysmal atrial fibrillation: Secondary | ICD-10-CM

## 2017-07-15 ENCOUNTER — Ambulatory Visit (HOSPITAL_BASED_OUTPATIENT_CLINIC_OR_DEPARTMENT_OTHER): Payer: Medicare Other

## 2017-07-15 ENCOUNTER — Other Ambulatory Visit: Payer: Self-pay

## 2017-07-15 ENCOUNTER — Ambulatory Visit (HOSPITAL_COMMUNITY)
Admission: RE | Admit: 2017-07-15 | Discharge: 2017-07-15 | Disposition: A | Discharge status: Home / Self Care | Payer: Medicare Other | Source: Ambulatory Visit | Attending: Cardiology | Admitting: Cardiology

## 2017-07-15 ENCOUNTER — Encounter (HOSPITAL_COMMUNITY)
Admission: RE | Disposition: A | Discharge status: Home / Self Care | Payer: Self-pay | Source: Ambulatory Visit | Attending: Cardiology

## 2017-07-15 ENCOUNTER — Encounter (HOSPITAL_COMMUNITY): Payer: Self-pay | Admitting: *Deleted

## 2017-07-15 ENCOUNTER — Ambulatory Visit (HOSPITAL_COMMUNITY): Payer: Medicare Other | Admitting: Certified Registered Nurse Anesthetist

## 2017-07-15 DIAGNOSIS — I480 Paroxysmal atrial fibrillation: Secondary | ICD-10-CM | POA: Insufficient documentation

## 2017-07-15 DIAGNOSIS — I4891 Unspecified atrial fibrillation: Secondary | ICD-10-CM

## 2017-07-15 DIAGNOSIS — I5032 Chronic diastolic (congestive) heart failure: Secondary | ICD-10-CM | POA: Insufficient documentation

## 2017-07-15 DIAGNOSIS — H3530 Unspecified macular degeneration: Secondary | ICD-10-CM | POA: Insufficient documentation

## 2017-07-15 DIAGNOSIS — Z8249 Family history of ischemic heart disease and other diseases of the circulatory system: Secondary | ICD-10-CM | POA: Diagnosis not present

## 2017-07-15 DIAGNOSIS — Z87891 Personal history of nicotine dependence: Secondary | ICD-10-CM | POA: Insufficient documentation

## 2017-07-15 DIAGNOSIS — D45 Polycythemia vera: Secondary | ICD-10-CM | POA: Diagnosis not present

## 2017-07-15 DIAGNOSIS — Z7901 Long term (current) use of anticoagulants: Secondary | ICD-10-CM | POA: Insufficient documentation

## 2017-07-15 DIAGNOSIS — I2510 Atherosclerotic heart disease of native coronary artery without angina pectoris: Secondary | ICD-10-CM | POA: Diagnosis not present

## 2017-07-15 DIAGNOSIS — E039 Hypothyroidism, unspecified: Secondary | ICD-10-CM | POA: Diagnosis not present

## 2017-07-15 DIAGNOSIS — I495 Sick sinus syndrome: Secondary | ICD-10-CM | POA: Diagnosis not present

## 2017-07-15 DIAGNOSIS — I110 Hypertensive heart disease with heart failure: Secondary | ICD-10-CM | POA: Diagnosis not present

## 2017-07-15 DIAGNOSIS — I340 Nonrheumatic mitral (valve) insufficiency: Secondary | ICD-10-CM

## 2017-07-15 HISTORY — PX: CARDIOVERSION: SHX1299

## 2017-07-15 HISTORY — PX: TEE WITHOUT CARDIOVERSION: SHX5443

## 2017-07-15 SURGERY — ECHOCARDIOGRAM, TRANSESOPHAGEAL
Anesthesia: Monitor Anesthesia Care

## 2017-07-15 MED ORDER — SODIUM CHLORIDE 0.9 % IV SOLN
INTRAVENOUS | Status: DC
  Administered 2017-07-15: 11:00:00 via INTRAVENOUS

## 2017-07-15 MED ORDER — PROPOFOL 10 MG/ML IV BOLUS
INTRAVENOUS | Status: DC | PRN
  Administered 2017-07-15 (×4): 20 mg via INTRAVENOUS

## 2017-07-15 MED ORDER — PROPOFOL 500 MG/50ML IV EMUL
INTRAVENOUS | Status: DC | PRN
  Administered 2017-07-15: 80 ug/kg/min via INTRAVENOUS

## 2017-07-15 NOTE — Interval H&P Note (Signed)
History and Physical Interval Note:  07/15/2017 12:32 PM  Sarah Mays  has presented today for surgery, with the diagnosis of afib  The various methods of treatment have been discussed with the patient and family. After consideration of risks, benefits and other options for treatment, the patient has consented to  Procedure(s): TRANSESOPHAGEAL ECHOCARDIOGRAM (TEE) (N/A) CARDIOVERSION (N/A) as a surgical intervention .  The patient's history has been reviewed, patient examined, no change in status, stable for surgery.  I have reviewed the patient's chart and labs.  Questions were answered to the patient's satisfaction.     Karanvir Balderston [employer]

## 2017-07-15 NOTE — Anesthesia Postprocedure Evaluation (Signed)
Anesthesia Post Note  Patient: Sarah Mays  Procedure(s) Performed: TRANSESOPHAGEAL ECHOCARDIOGRAM (TEE) (N/A ) CARDIOVERSION (N/A )     Patient location during evaluation: Endoscopy Anesthesia Type: MAC Level of consciousness: awake and alert Pain management: pain level controlled Vital Signs Assessment: post-procedure vital signs reviewed and stable Respiratory status: spontaneous breathing, nonlabored ventilation, respiratory function stable and patient connected to nasal cannula oxygen Cardiovascular status: stable and blood pressure returned to baseline Postop Assessment: no apparent nausea or vomiting Anesthetic complications: no    Last Vitals:  Vitals:   07/15/17 1320 07/15/17 1326  BP:  109/65  Pulse: 77 (!) 103  Resp: 18 17  Temp:    SpO2: 94% 93%    Last Pain:  Vitals:   07/15/17 1326  TempSrc:   PainSc: 0-No pain                 [name] COKER

## 2017-07-15 NOTE — Discharge Instructions (Signed)

## 2017-07-15 NOTE — CV Procedure (Signed)
Procedure: TEE  Indication: Atrial fibrillation  Sedation: Per anesthesiology  Findings: Please see echo section for full report.  Normal LV size with mild LV hypertrophy.  EF 55-60%, normal wall motion.  Mildly dilated RV with mildly decreased systolic function.  Moderate left atrial enlargement, no LA appendage thrombus.  Moderate right atrial enlargement.  No ASD/PFO by color doppler.  Mild TR, peak RV-RA gradient 40 mmHg.  The mitral valve was mildly thickened with bileaflet mild prolapse.  There was moderate-severe MR with ERO 0.37 cm^2 and flow reversal noted in the pulmonary vein systolic PW doppler pattern.  Trileaflet, mildly calcified aortic valve with no stenosis or regurgitation.  Normal caliber aorta with mild plaque.    Impression: No LA appendage thrombus, may proceed with DCCV.  Moderate to severe MR.   Loralie Champagne 07/15/2017 1:07 PM

## 2017-07-15 NOTE — Progress Notes (Signed)
  Echocardiogram Echocardiogram Transesophageal has been performed.  Sarah Mays 07/15/2017, 2:20 PM

## 2017-07-15 NOTE — Transfer of Care (Signed)
Immediate Anesthesia Transfer of Care Note  Patient: Sarah Mays  Procedure(s) Performed: TRANSESOPHAGEAL ECHOCARDIOGRAM (TEE) (N/A ) CARDIOVERSION (N/A )  Patient Location: Endoscopy Unit  Anesthesia Type:MAC  Level of Consciousness: awake, alert  and oriented  Airway & Oxygen Therapy: Patient Spontanous Breathing  Post-op Assessment: Report given to RN and Post -op Vital signs reviewed and stable  Post vital signs: Reviewed and stable  Last Vitals:  Vitals Value Taken Time  BP    Temp    Pulse    Resp    SpO2      Last Pain:  Vitals:   07/15/17 1059  TempSrc: Oral  PainSc: 0-No pain         Complications: No apparent anesthesia complications

## 2017-07-15 NOTE — Procedures (Signed)
Electrical Cardioversion Procedure Note Sarah Mays [id number] 1930/06/21  Procedure: Electrical Cardioversion Indications:  Atrial Fibrillation  Procedure Details Consent: Risks of procedure as well as the alternatives and risks of each were explained to the (patient/caregiver).  Consent for procedure obtained. Time Out: Verified patient identification, verified procedure, site/side was marked, verified correct patient position, special equipment/implants available, medications/allergies/relevent history reviewed, required imaging and test results available.  Performed  Patient placed on cardiac monitor, pulse oximetry, supplemental oxygen as necessary.  Sedation given: Propofol Pacer pads placed anterior and posterior chest.  Cardioverted 1 time(s).  Cardioverted at [place].  Evaluation Findings: Post procedure EKG shows: NSR Complications: None Patient did tolerate procedure well.   Loralie Champagne 07/15/2017, 1:08 PM

## 2017-07-15 NOTE — Anesthesia Preprocedure Evaluation (Signed)
Anesthesia Evaluation  Patient identified by MRN, date of birth, ID band Patient awake    Reviewed: Allergy & Precautions, NPO status , Patient's Chart, lab work & pertinent test results  History of Anesthesia Complications (+) PONV  Airway Mallampati: II  TM Distance: >3 FB Neck ROM: Full    Dental no notable dental hx.    Pulmonary former smoker,    breath sounds clear to auscultation       Cardiovascular hypertension, + CAD and +CHF  + dysrhythmias Atrial Fibrillation  Rhythm:Irregular Rate:Normal     Neuro/Psych    GI/Hepatic negative GI ROS, Neg liver ROS,   Endo/Other  Hypothyroidism   Renal/GU negative Renal ROS     Musculoskeletal   Abdominal   Peds  Hematology   Anesthesia Other Findings   Reproductive/Obstetrics                             Anesthesia Physical  Anesthesia Plan  ASA: III  Anesthesia Plan: MAC   Post-op Pain Management:    Induction: Intravenous  PONV Risk Score and Plan: 3 and Treatment may vary due to age or medical condition and Propofol infusion  Airway Management Planned: Nasal Cannula  Additional Equipment:   Intra-op Plan:   Post-operative Plan:   Informed Consent: I have reviewed the patients History and Physical, chart, labs and discussed the procedure including the risks, benefits and alternatives for the proposed anesthesia with the patient or authorized representative who has indicated his/her understanding and acceptance.     Plan Discussed with: CRNA  Anesthesia Plan Comments:         Anesthesia Quick Evaluation

## 2017-07-16 ENCOUNTER — Other Ambulatory Visit: Payer: Self-pay | Admitting: Internal Medicine

## 2017-07-17 ENCOUNTER — Encounter (HOSPITAL_COMMUNITY): Payer: Self-pay | Admitting: Cardiology

## 2017-07-19 ENCOUNTER — Encounter: Payer: Self-pay | Admitting: Internal Medicine

## 2017-07-19 ENCOUNTER — Other Ambulatory Visit: Payer: Self-pay | Admitting: Internal Medicine

## 2017-07-19 DIAGNOSIS — M545 Low back pain, unspecified: Secondary | ICD-10-CM

## 2017-07-19 DIAGNOSIS — M7911 Myalgia of mastication muscle: Secondary | ICD-10-CM | POA: Diagnosis not present

## 2017-07-19 DIAGNOSIS — M47816 Spondylosis without myelopathy or radiculopathy, lumbar region: Secondary | ICD-10-CM | POA: Diagnosis not present

## 2017-07-20 ENCOUNTER — Telehealth: Payer: Self-pay | Admitting: *Deleted

## 2017-07-20 NOTE — Telephone Encounter (Signed)
Patient called and stated she Was given injections yesterday for her shoulder and they told her it would take her 2 days to 2 weeks before it kicks in for the pain. Stated that she can take the pain during the day but at night it bothers her and she can't sleep. No relief at night with the Gabapentin or Tylenol. Patient requesting to speak with [facility]. Please Advise.

## 2017-07-20 NOTE — Telephone Encounter (Signed)
Spoke with patient it's more of a aching pain, per pt she doesn't do well with hydrocodone or oxycodone. Pt states she will keep using the tramadol and gabapentin and will call if the pain doesn't get any better.

## 2017-07-20 NOTE — Progress Notes (Signed)
[facility] [address] [place], [place] [address] Phone: [phone] Subjective:      CC: Right shoulder pain  [id number]  Sarah Mays is a 84 y.o. female coming in with complaint of right shoulder pain. Had an injection on Tuesday in her back and her shoulder. Pain in the cervical spine right side and it can radiate down into the right arm. Patient has been using tramodol, gabapentin, and tylenol at night.  Has responded well to trigger point injections previously.  Patient states that does not feel to be as much as the shoulder even though she does have known rotator cuff arthropathy.  Mild neck pain but overall does not think it is contributing.     Past Medical History:  Diagnosis Date  . Abnormal stress test    a. 11/2011 Ex MV: EF 80%, small, partially reversible anteroapical defect->mild ischemia vs attenuation-->med Rx.  [name] Kitchen Alopecia, unspecified   . Arthritis   . Chronic diastolic CHF (congestive heart failure) ([place])    a. 11/2013 Echo: EF 60-65%, no rwma, mild TR, PASP 92mmHg.  . Closed fracture of lower end of radius with ulna   . Deafness    Left, s/p multiple surgeries  . Essential hypertension   . Gouty arthropathy, unspecified   . History of kidney stones   . Hypothyroidism   . Insomnia, unspecified   . Macular degeneration    Left, s/p inj. tx  . Mitral valve disorders(424.0)   . Neck mass    right, w/u including MRI negative  . PAF (paroxysmal atrial fibrillation) ([facility])    a. on amio (02/2012 mild obstruction on PFT's) and xarelto.  . Polycythemia vera(238.4)   . PONV (postoperative nausea and vomiting)    no problem last surgery  . Pure hypercholesterolemia   . Senile osteoporosis    Reclast in the past  . Tricuspid valve disorders, specified as nonrheumatic    Past Surgical History:  Procedure Laterality Date  . ABLATION  08/28/14   Back   . BREAST BIOPSY  06/18/1998   left  . BREAST BIOPSY Right   .  CARDIOVERSION N/A 07/15/2017   Procedure: CARDIOVERSION;  Surgeon: Larey Dresser, MD;  Location: [facility] ENDOSCOPY;  Service: Cardiovascular;  Laterality: N/A;  . ELECTROPHYSIOLOGIC STUDY N/A 01/08/2016   Procedure: Atrial Fibrillation Ablation;  Surgeon: Thompson Grayer, MD;  Location: [facility] CV LAB;  Service: Cardiovascular;  Laterality: N/A;  . FEMUR IM NAIL Right 10/08/2015   Procedure: INTRAMEDULLARY (IM) NAIL FEMORAL RIGHT;  Surgeon: Paralee Cancel, MD;  Location: [facility] ORS;  Service: Orthopedics;  Laterality: Right;  . MASS EXCISION Right 11/26/2016   Procedure: EXCISION RIGHT LATERAL NECK MASS;  Surgeon: Jerrell Belfast, MD;  Location: [facility];  Service: ENT;  Laterality: Right;  . TEE WITHOUT CARDIOVERSION N/A 07/15/2017   Procedure: TRANSESOPHAGEAL ECHOCARDIOGRAM (TEE);  Surgeon: Larey Dresser, MD;  Location: [facility] ENDOSCOPY;  Service: Cardiovascular;  Laterality: N/A;  . TONSILLECTOMY    . TONSILLECTOMY    . TOTAL ABDOMINAL HYSTERECTOMY    . TYMPANOPLASTY Bilateral    Social History   Socioeconomic History  . Marital status: Married    Spouse name: Sarah Mays  . Number of children: 2  . Years of education: 63  . Highest education level: Not on file  Occupational History  . Not on file  Social Needs  . Financial resource strain: Not on file  . Food insecurity:    Worry: Not on file  Inability: Not on file  . Transportation needs:    Medical: Not on file    Non-medical: Not on file  Tobacco Use  . Smoking status: Former Smoker    Types: Cigarettes    Last attempt to quit: 01/19/1984    Years since quitting: 33.5  . Smokeless tobacco: Never Used  Substance and Sexual Activity  . Alcohol use: Yes    Alcohol/week: 0.6 oz    Types: 1 Glasses of wine per week    Comment: 2 per day/ 14 a week  . Drug use: No  . Sexual activity: Not Currently  Lifestyle  . Physical activity:    Days per week: Not on file    Minutes per session: Not on file  . Stress: Not on file  Relationships    . Social connections:    Talks on phone: Not on file    Gets together: Not on file    Attends religious service: Not on file    Active member of club or organization: Not on file    Attends meetings of clubs or organizations: Not on file    Relationship status: Not on file  Other Topics Concern  . Not on file  Social History Narrative   Patient is Married 1955. 2 kids. 44 grandkid-45 years old in 2016. College graduate [name]).    Lives in apartment,  Independent Living  section at [place] since 02/2013.      Stay at home mother.       No Smoking history, Mod. alcohol use.   Patient has a living will, POA      Hobbies: CPU and painting         Allergies  Allergen Reactions  . Amlodipine Swelling and Other (See Comments)    Reaction:  Lower extremity swelling    Family History  Problem Relation Age of Onset  . Hypertension Father   . Heart disease Father        patient does not know details.   . Ovarian cancer Mother 78  . Cancer Sister        colon  . Heart disease Sister   . Cancer Sister   . Cancer Sister        hodgkin disease  . Breast cancer Daughter   . Cancer Daughter        breast     Past medical history, social, surgical and family history all reviewed in electronic medical record.  No pertanent information unless stated regarding to the chief complaint.   Review of Systems:Review of systems updated and as accurate as of 07/21/17  No headache, visual changes, nausea, vomiting, diarrhea, constipation, dizziness, abdominal pain, skin rash, fevers, chills, night sweats, weight loss, swollen lymph nodes, body aches, joint swelling, , chest pain, shortness of breath, mood changes.  Positive muscle aches  Objective  Blood pressure 118/70, height 5' (1.524 m), weight 109 lb (49.4 kg), SpO2 93 %. Systems examined below as of 07/21/17   General: No apparent distress alert and oriented x3 mood and affect normal, dressed  appropriately.  HEENT: Pupils equal, extraocular movements intact  Respiratory: Patient's speak in full sentences and does not appear short of breath  Cardiovascular: No lower extremity edema, non tender, no erythema  Skin: Warm dry intact with no signs of infection or rash on extremities or on axial skeleton.  Abdomen: Soft nontender  Neuro: Cranial nerves II through XII are intact, neurovascularly intact in all extremities with  2+ DTRs and 2+ pulses.  Lymph: No lymphadenopathy of posterior or anterior cervical chain or axillae bilaterally.  Gait antalgic using the aid of a walker MSK: Mild tender with with mild limited range of motion and good stability and symmetric strength and tone of  elbows, wrist, hip, knee and ankles bilaterally.  Right shoulder shows some mild crepitus.  Does have near full range of motion.  Positive impingement.  Rotator cuff strength 4 out of 5.  Multiple trigger points of the posterior aspect of the shoulder.  After verbal consent patient was prepped with alcohol swab then injected in 3 distinct trigger points along the medial aspect of the scapula on the right side.  Total of 3 cc of 0.5% Marcaine and 1 cc of Kenalog 40 mg/mL   Impression and Recommendations:     This case required medical decision making of moderate complexity.      Note: This dictation was prepared with Dragon dictation along with smaller phrase technology. Any transcriptional errors that result from this process are unintentional.

## 2017-07-20 NOTE — Telephone Encounter (Signed)
Spoke with patient and she wants something for pain for short term, per pt she's taking Gabapentin and Tramadol every night and that's not helping. Please advise

## 2017-07-20 NOTE — Telephone Encounter (Signed)
If the pain is burning/tingling pain, we might try increasing the gabapentin to 300mg .  If it's an aching pain, we might stop tramadol temporarily in favor of hydrocodone or oxycodone.  I just worry about her having been woozy and weak and then starting her on a stronger pain medication in her [place] apt.

## 2017-07-21 ENCOUNTER — Encounter: Payer: Self-pay | Admitting: Family Medicine

## 2017-07-21 ENCOUNTER — Ambulatory Visit (INDEPENDENT_AMBULATORY_CARE_PROVIDER_SITE_OTHER): Payer: Medicare Other | Admitting: Family Medicine

## 2017-07-21 DIAGNOSIS — M25511 Pain in right shoulder: Secondary | ICD-10-CM

## 2017-07-21 NOTE — Assessment & Plan Note (Signed)
Worsening symptoms. Trigger point injections given again, discussed also shoulder injection which patient declined.  Discussed also cervical radiculopathy, did not want any change in management.  RTC in 4 weeks.

## 2017-07-21 NOTE — Patient Instructions (Signed)
Good to see you as always Ice is your friend Try increasing gabapentin short term to 2 pills at night Did some more trigger point injection today and hope it helps Concern could be more of the neck  See me again in 2 weeks just in case.

## 2017-07-22 ENCOUNTER — Ambulatory Visit (INDEPENDENT_AMBULATORY_CARE_PROVIDER_SITE_OTHER)
Admission: RE | Admit: 2017-07-22 | Discharge: 2017-07-22 | Disposition: A | Discharge status: Home / Self Care | Payer: Medicare Other | Source: Ambulatory Visit | Attending: Family Medicine | Admitting: Family Medicine

## 2017-07-22 ENCOUNTER — Other Ambulatory Visit: Payer: Self-pay | Admitting: Internal Medicine

## 2017-07-22 ENCOUNTER — Ambulatory Visit (INDEPENDENT_AMBULATORY_CARE_PROVIDER_SITE_OTHER): Payer: Medicare Other | Admitting: Family Medicine

## 2017-07-22 ENCOUNTER — Ambulatory Visit: Payer: Self-pay

## 2017-07-22 ENCOUNTER — Encounter: Payer: Self-pay | Admitting: Family Medicine

## 2017-07-22 VITALS — BP 146/68 | HR 68 | Ht 60.0 in | Wt 108.0 lb

## 2017-07-22 DIAGNOSIS — M545 Low back pain, unspecified: Secondary | ICD-10-CM

## 2017-07-22 DIAGNOSIS — R079 Chest pain, unspecified: Secondary | ICD-10-CM | POA: Diagnosis not present

## 2017-07-22 DIAGNOSIS — M25511 Pain in right shoulder: Secondary | ICD-10-CM | POA: Diagnosis not present

## 2017-07-22 DIAGNOSIS — G8929 Other chronic pain: Secondary | ICD-10-CM | POA: Diagnosis not present

## 2017-07-22 DIAGNOSIS — M12811 Other specific arthropathies, not elsewhere classified, right shoulder: Secondary | ICD-10-CM

## 2017-07-22 MED ORDER — PREDNISONE 20 MG PO TABS: 40.0000 mg | ORAL_TABLET | Freq: Every day | ORAL | 0 refills | Status: DC

## 2017-07-22 NOTE — Progress Notes (Signed)
[facility] [address] [place], [place] [address] Phone: [phone] Subjective:     CC: Continued right shoulder pain  [id number]  Sarah Mays is a 84 y.o. female coming in with complaint of patient does have more of a right shoulder pain.  Has had trigger point injections yesterday with no significant benefit.  Continues to have worsening pain.  Patient denies all shoulder pain she states.  Patient does not have known rotator cuff arthropathy.  Did have a left-sided neck mass that was completely removed and was benign.  States that she does not have any increasing neck pain.     Past Medical History:  Diagnosis Date  . Abnormal stress test    a. 11/2011 Ex MV: EF 80%, small, partially reversible anteroapical defect->mild ischemia vs attenuation-->med Rx.  [name] Kitchen Alopecia, unspecified   . Arthritis   . Chronic diastolic CHF (congestive heart failure) ([place])    a. 11/2013 Echo: EF 60-65%, no rwma, mild TR, PASP 31mmHg.  . Closed fracture of lower end of radius with ulna   . Deafness    Left, s/p multiple surgeries  . Essential hypertension   . Gouty arthropathy, unspecified   . History of kidney stones   . Hypothyroidism   . Insomnia, unspecified   . Macular degeneration    Left, s/p inj. tx  . Mitral valve disorders(424.0)   . Neck mass    right, w/u including MRI negative  . PAF (paroxysmal atrial fibrillation) ([facility])    a. on amio (02/2012 mild obstruction on PFT's) and xarelto.  . Polycythemia vera(238.4)   . PONV (postoperative nausea and vomiting)    no problem last surgery  . Pure hypercholesterolemia   . Senile osteoporosis    Reclast in the past  . Tricuspid valve disorders, specified as nonrheumatic    Past Surgical History:  Procedure Laterality Date  . ABLATION  08/28/14   Back   . BREAST BIOPSY  06/18/1998   left  . BREAST BIOPSY Right   . CARDIOVERSION N/A 07/15/2017   Procedure: CARDIOVERSION;  Surgeon: Larey Dresser, MD;  Location[facility] ENDOSCOPY;  Service: Cardiovascular;  Laterality: N/A;  . ELECTROPHYSIOLOGIC STUDY N/A 01/08/2016   Procedure: Atrial Fibrillation Ablation;  Surgeon: Thompson Grayer, MD;  Location: [place] CV LAB;  Service: Cardiovascular;  Laterality: N/A;  . FEMUR IM NAIL Right 10/08/2015   Procedure: INTRAMEDULLARY (IM) NAIL FEMORAL RIGHT;  Surgeon: Paralee Cancel, MD;  Location: [facility] ORS;  Service: Orthopedics;  Laterality: Right;  . MASS EXCISION Right 11/26/2016   Procedure: EXCISION RIGHT LATERAL NECK MASS;  Surgeon: Jerrell Belfast, MD;  Location: [place];  Service: ENT;  Laterality: Right;  . TEE WITHOUT CARDIOVERSION N/A 07/15/2017   Procedure: TRANSESOPHAGEAL ECHOCARDIOGRAM (TEE);  Surgeon: Larey Dresser, MD;  Location: [facility] ENDOSCOPY;  Service: Cardiovascular;  Laterality: N/A;  . TONSILLECTOMY    . TONSILLECTOMY    . TOTAL ABDOMINAL HYSTERECTOMY    . TYMPANOPLASTY Bilateral    Social History   Socioeconomic History  . Marital status: Married    Spouse name: Sarah Mays  . Number of children: 2  . Years of education: 6  . Highest education level: Not on file  Occupational History  . Not on file  Social Needs  . Financial resource strain: Not on file  . Food insecurity:    Worry: Not on file    Inability: Not on file  . Transportation needs:    Medical:  Not on file    Non-medical: Not on file  Tobacco Use  . Smoking status: Former Smoker    Types: Cigarettes    Last attempt to quit: 01/19/1984    Years since quitting: 33.5  . Smokeless tobacco: Never Used  Substance and Sexual Activity  . Alcohol use: Yes    Alcohol/week: 0.6 oz    Types: 1 Glasses of wine per week    Comment: 2 per day/ 14 a week  . Drug use: No  . Sexual activity: Not Currently  Lifestyle  . Physical activity:    Days per week: Not on file    Minutes per session: Not on file  . Stress: Not on file  Relationships  . Social connections:    Talks on phone: Not on file    Gets together: Not on  file    Attends religious service: Not on file    Active member of club or organization: Not on file    Attends meetings of clubs or organizations: Not on file    Relationship status: Not on file  Other Topics Concern  . Not on file  Social History Narrative   Patient is Married 1955. 2 kids. 64 grandkid-19 years old in 2016. College graduate [employer]. Of [place]).    Lives in apartment,  Independent Living  section at [place] since 02/2013.      Stay at home mother.       No Smoking history, Mod. alcohol use.   Patient has a living will, POA      Hobbies: CPU and painting         Allergies  Allergen Reactions  . Amlodipine Swelling and Other (See Comments)    Reaction:  Lower extremity swelling    Family History  Problem Relation Age of Onset  . Hypertension Father   . Heart disease Father        patient does not know details.   . Ovarian cancer Mother 54  . Cancer Sister        colon  . Heart disease Sister   . Cancer Sister   . Cancer Sister        hodgkin disease  . Breast cancer Daughter   . Cancer Daughter        breast     Past medical history, social, surgical and family history all reviewed in electronic medical record.  No pertanent information unless stated regarding to the chief complaint.   Review of Systems:Review of systems updated and as accurate as of 07/22/17  No headache, visual changes, nausea, vomiting, diarrhea, constipation, dizziness, abdominal pain, skin rash, fevers, chills, night sweats, weight loss, swollen lymph nodes, , chest pain, shortness of breath, mood changes.  Positive muscle aches, body aches  Objective  Blood pressure (!) 146/68, pulse 68, height 5' (1.524 m), weight 108 lb (49 kg), SpO2 93 %. Systems examined below as of 07/22/17   General: No apparent distress alert and oriented x3 mood and affect normal, dressed appropriately.  HEENT: Pupils equal, extraocular movements intact  Respiratory: Patient's  speak in full sentences and does not appear short of breath  Cardiovascular: No lower extremity edema, non tender, no erythema  Skin: Warm dry intact with no signs of infection or rash on extremities or on axial skeleton.  Abdomen: Soft nontender  Neuro: Cranial nerves II through XII are intact, neurovascularly intact in all extremities with 2+ DTRs and 2+ pulses.  Lymph: No lymphadenopathy of posterior or  anterior cervical chain or axillae bilaterally.  Gait antalgic gait MSK:  tender with limited range of motion and good stability and symmetric strength and tone of  elbows, wrist, hip, knee and ankles bilaterally.  Patient's right shoulder does have rotator cuff arthropathy.  Patient does have positive crepitus.  Patient has some mild limitation in range of motion.  Trigger point still noted of the right periscapular region.  Procedure: Real-time Ultrasound Guided Injection of right glenohumeral joint Device: GE Logiq Q7  Ultrasound guided injection is preferred based studies that show increased duration, increased effect, greater accuracy, decreased procedural pain, increased response rate with ultrasound guided versus blind injection.  Verbal informed consent obtained.  Time-out conducted.  Noted no overlying erythema, induration, or other signs of local infection.  Skin prepped in a sterile fashion.  Local anesthesia: Topical Ethyl chloride.  With sterile technique and under real time ultrasound guidance:  Joint visualized.  23g 1  inch needle inserted posterior approach. Pictures taken for needle placement. Patient did have injection of 2 cc of 1% lidocaine, 2 cc of 0.5% Marcaine, and 1.0 cc of Kenalog 40 mg/dL. Completed without difficulty  Pain immediately resolved suggesting accurate placement of the medication.  Advised to call if fevers/chills, erythema, induration, drainage, or persistent bleeding.  Images permanently stored and available for review in the ultrasound unit.    Impression: Technically successful ultrasound guided injection.    Impression and Recommendations:     This case required medical decision making of moderate complexity.      Note: This dictation was prepared with Dragon dictation along with smaller phrase technology. Any transcriptional errors that result from this process are unintentional.

## 2017-07-22 NOTE — Assessment & Plan Note (Signed)
Attempted injection.  Patient does have underlying chronic pain disorder.  Patient is already on chronic narcotics.  Patient had a neck mass and we discussed at this point we will get a neck and chest x-rays to further evaluate.  Patient will do icing regimen.  Patient given also prednisone.  Follow-up again in 4 weeks

## 2017-07-22 NOTE — Patient Instructions (Addendum)
I am sorry you are hurting.  We will get a chest xray today  I am hoping the shoulder injection helps a lot.  Prednisone daily for 5 days IF this does not work.  Make an appointment in 2 weeks just in case

## 2017-07-24 ENCOUNTER — Other Ambulatory Visit (HOSPITAL_COMMUNITY): Payer: Self-pay | Admitting: Cardiology

## 2017-07-25 ENCOUNTER — Encounter: Payer: Self-pay | Admitting: Family Medicine

## 2017-07-25 ENCOUNTER — Other Ambulatory Visit (INDEPENDENT_AMBULATORY_CARE_PROVIDER_SITE_OTHER): Payer: Medicare Other

## 2017-07-25 ENCOUNTER — Ambulatory Visit (INDEPENDENT_AMBULATORY_CARE_PROVIDER_SITE_OTHER): Payer: Medicare Other | Admitting: Family Medicine

## 2017-07-25 VITALS — BP 140/74 | HR 75 | Ht 60.0 in | Wt 108.0 lb

## 2017-07-25 DIAGNOSIS — G8929 Other chronic pain: Secondary | ICD-10-CM | POA: Diagnosis not present

## 2017-07-25 DIAGNOSIS — R079 Chest pain, unspecified: Secondary | ICD-10-CM | POA: Diagnosis not present

## 2017-07-25 DIAGNOSIS — R221 Localized swelling, mass and lump, neck: Secondary | ICD-10-CM

## 2017-07-25 DIAGNOSIS — M25511 Pain in right shoulder: Secondary | ICD-10-CM | POA: Diagnosis not present

## 2017-07-25 DIAGNOSIS — G894 Chronic pain syndrome: Secondary | ICD-10-CM

## 2017-07-25 DIAGNOSIS — R0602 Shortness of breath: Secondary | ICD-10-CM

## 2017-07-25 DIAGNOSIS — M47812 Spondylosis without myelopathy or radiculopathy, cervical region: Secondary | ICD-10-CM | POA: Diagnosis not present

## 2017-07-25 DIAGNOSIS — R634 Abnormal weight loss: Secondary | ICD-10-CM | POA: Diagnosis not present

## 2017-07-25 LAB — SEDIMENTATION RATE: SED RATE: 5 mm/h (ref 0–30)

## 2017-07-25 MED ORDER — KETOROLAC TROMETHAMINE 30 MG/ML IJ SOLN
30.0000 mg | Freq: Once | INTRAMUSCULAR | Status: AC
  Administered 2017-07-25: 30 mg via INTRAMUSCULAR

## 2017-07-25 MED ORDER — KETOROLAC TROMETHAMINE 60 MG/2ML IM SOLN: 60.0000 mg | Freq: Once | INTRAMUSCULAR | Status: DC

## 2017-07-25 MED ORDER — METHYLPREDNISOLONE ACETATE 40 MG/ML IJ SUSP
40.0000 mg | Freq: Once | INTRAMUSCULAR | Status: AC
  Administered 2017-07-25: 40 mg via INTRAMUSCULAR

## 2017-07-25 NOTE — Progress Notes (Signed)
[facility] [address] [place], [place] [address] Phone: [phone] Subjective:     CC: Right shoulder back pain  [id number]  Sarah Mays is a 84 y.o. female coming in with complaint of right shoulder back pain.  Has been seen previously.  Does have rotator cuff arthropathy and given injection 3 days ago with no improvement.  The day before that was given trigger point injection along improvement, seems to be worsening.  Rates the severity pain is 8 out of 10.  Patient is on a fentanyl patch, gabapentin, icing regimen.    Past Medical History:  Diagnosis Date  . Abnormal stress test    a. 11/2011 Ex MV: EF 80%, small, partially reversible anteroapical defect->mild ischemia vs attenuation-->med Rx.  [name] Kitchen Alopecia, unspecified   . Arthritis   . Chronic diastolic CHF (congestive heart failure) ([place])    a. 11/2013 Echo: EF 60-65%, no rwma, mild TR, PASP 18mmHg.  . Closed fracture of lower end of radius with ulna   . Deafness    Left, s/p multiple surgeries  . Essential hypertension   . Gouty arthropathy, unspecified   . History of kidney stones   . Hypothyroidism   . Insomnia, unspecified   . Macular degeneration    Left, s/p inj. tx  . Mitral valve disorders(424.0)   . Neck mass    right, w/u including MRI negative  . PAF (paroxysmal atrial fibrillation) ([facility])    a. on amio (02/2012 mild obstruction on PFT's) and xarelto.  . Polycythemia vera(238.4)   . PONV (postoperative nausea and vomiting)    no problem last surgery  . Pure hypercholesterolemia   . Senile osteoporosis    Reclast in the past  . Tricuspid valve disorders, specified as nonrheumatic    Past Surgical History:  Procedure Laterality Date  . ABLATION  08/28/14   Back   . BREAST BIOPSY  06/18/1998   left  . BREAST BIOPSY Right   . CARDIOVERSION N/A 07/15/2017   Procedure: CARDIOVERSION;  Surgeon: Larey Dresser, MD;  Location: [facility] ENDOSCOPY;  Service: Cardiovascular;   Laterality: N/A;  . ELECTROPHYSIOLOGIC STUDY N/A 01/08/2016   Procedure: Atrial Fibrillation Ablation;  Surgeon: Thompson Grayer, MD;  Location: [facility] CV LAB;  Service: Cardiovascular;  Laterality: N/A;  . FEMUR IM NAIL Right 10/08/2015   Procedure: INTRAMEDULLARY (IM) NAIL FEMORAL RIGHT;  Surgeon: Paralee Cancel, MD;  Location: [facility] OR[facility];  Service: Orthopedics;  Laterality: Right;  . MASS EXCISION Right 11/26/2016   Procedure: EXCISION RIGHT LATERAL NECK MASS;  Surgeon: Jerrell Belfast, MD;  Location: [facility];  Service: ENT;  Laterality: Right;  . TEE WITHOUT CARDIOVERSION N/A 07/15/2017   Procedure: TRANSESOPHAGEAL ECHOCARDIOGRAM (TEE);  Surgeon: Larey Dresser, MD;  Location: [facility] ENDOSCOPY;  Service: Cardiovascular;  Laterality: N/A;  . TONSILLECTOMY    . TONSILLECTOMY    . TOTAL ABDOMINAL HYSTERECTOMY    . TYMPANOPLASTY Bilateral    Social History   Socioeconomic History  . Marital status: Married    Spouse name: [name]  . Number of children: 2  . Years of education: 47  . Highest education level: Not on file  Occupational History  . Not on file  Social Needs  . Financial resource strain: Not on file  . Food insecurity:    Worry: Not on file    Inability: Not on file  . Transportation needs:    Medical: Not on file    Non-medical: Not on file  Tobacco Use  . Smoking status: Former Smoker    Types: Cigarettes    Last attempt to quit: 01/19/1984    Years since quitting: 33.5  . Smokeless tobacco: Never Used  Substance and Sexual Activity  . Alcohol use: Yes    Alcohol/week: 0.6 oz    Types: 1 Glasses of wine per week    Comment: 2 per day/ 14 a week  . Drug use: No  . Sexual activity: Not Currently  Lifestyle  . Physical activity:    Days per week: Not on file    Minutes per session: Not on file  . Stress: Not on file  Relationships  . Social connections:    Talks on phone: Not on file    Gets together: Not on file    Attends religious service: Not on file    Active  member of club or organization: Not on file    Attends meetings of clubs or organizations: Not on file    Relationship status: Not on file  Other Topics Concern  . Not on file  Social History Narrative   Patient is Married 1955. 2 kids. 7 grandkid-3 years old in 2016. College graduate Sarah M[employer]. Of [place]).    Lives in apartment,  Independent Living  section at [place] since 02/2013.      Stay at home mother.       No Smoking history, Mod. alcohol use.   Patient has a living will, POA      Hobbies: CPU and painting         Allergies  Allergen Reactions  . Amlodipine Swelling and Other (See Comments)    Reaction:  Lower extremity swelling    Family History  Problem Relation Age of Onset  . Hypertension Father   . Heart disease Father        patient does not know details.   . Ovarian cancer Mother 57  . Cancer Sister        colon  . Heart disease Sister   . Cancer Sister   . Cancer Sister        hodgkin disease  . Breast cancer Daughter   . Cancer Daughter        breast     Past medical history, social, surgical and family history all reviewed in electronic medical record.  No pertanent information unless stated regarding to the chief complaint.   Review of Systems:Review of systems updated and as accurate as of 07/25/17  No headache, visual changes, nausea, vomiting, diarrhea, constipation, dizziness, abdominal pain, skin rash, fevers, chills, night sweats, weight loss, swollen lymph nodes, body aches, joint swelling, , chest pain, shortness of breath, mood changes.  Positive muscle aches  Objective  Blood pressure 140/74, pulse 75, height 5' (1.524 m), weight 108 lb (49 kg), SpO2 96 %. Systems examined below as of 07/25/17   General: No apparent distress alert and oriented x3 mood and affect normal, dressed appropriately.  HEENT: Pupils equal, extraocular movements intact  Respiratory: Patient's speak in full sentences and does not appear  short of breath  Cardiovascular: No lower extremity edema, non tender, no erythema  Skin: Warm dry intact with no signs of infection or rash on extremities or on axial skeleton.  Abdomen: Soft nontender  Neuro: Cranial nerves II through XII are intact, neurovascularly intact in all extremities with 2+ DTRs and 2+ pulses.  Lymph: No lymphadenopathy of posterior or anterior cervical chain or axillae bilaterally.  Gait antalgic walking  with the aid of a walker MSK: Diffuse arthritic changes of multiple joints noted but seem to be symmetric strength. Right shoulder does show arthritic changes noted and crepitus.  Patient still has severe tenderness to palpation in the right soft tissue but no masses appreciated.  Neck exam shows significant crepitus.  Worsening pain of the neck and shoulder with extension of the neck.  Patient has minimal side bending and rotation to the right.  Patient does have some weakness in the C6 and C8 distribution of the right hand compared to the contralateral side.    Impression and Recommendations:     This case required medical decision making of moderate complexity.      Note: This dictation was prepared with Dragon dictation along with smaller phrase technology. Any transcriptional errors that result from this process are unintentional.

## 2017-07-25 NOTE — Assessment & Plan Note (Signed)
I believe the patient is having worsening symptoms.  CT scan ordered today.  We are going to do a CT scan because I also want a CT angiogram of the chest with and without contrast.  Patient has had this for quite some time and pain seems to be out of proportion to the amount of palpation.  Cervical radiculopathy is within the differential.  Discussed icing regimen.  Depending on imaging patient could be candidate for possible epidural but will have to be off her blood thinner.  We will have to discuss this with her cardiologist.  Has done this though previously for her back.

## 2017-07-25 NOTE — Patient Instructions (Signed)
Good to see yo u We will get ;labs today  Gave you injections today for the pain  We will get CT scan as well and see what we need to do  Depending on the imaging we will discuss next moves.

## 2017-07-25 NOTE — Assessment & Plan Note (Signed)
History of neck mass that needed removal that seem to be benign.  Area for patient has had difficulty and does have some weakness.  CT scan will be done.

## 2017-07-26 ENCOUNTER — Encounter: Payer: Self-pay | Admitting: Hematology and Oncology

## 2017-07-26 ENCOUNTER — Other Ambulatory Visit: Payer: Self-pay | Admitting: Hematology and Oncology

## 2017-07-26 ENCOUNTER — Encounter (HOSPITAL_COMMUNITY): Payer: Self-pay

## 2017-07-26 ENCOUNTER — Emergency Department (HOSPITAL_COMMUNITY)
Admission: EM | Admit: 2017-07-26 | Discharge: 2017-07-26 | Disposition: A | Discharge status: Home / Self Care | Payer: Medicare Other | Attending: Emergency Medicine | Admitting: Emergency Medicine

## 2017-07-26 DIAGNOSIS — I110 Hypertensive heart disease with heart failure: Secondary | ICD-10-CM | POA: Insufficient documentation

## 2017-07-26 DIAGNOSIS — I2510 Atherosclerotic heart disease of native coronary artery without angina pectoris: Secondary | ICD-10-CM | POA: Diagnosis not present

## 2017-07-26 DIAGNOSIS — R040 Epistaxis: Secondary | ICD-10-CM | POA: Diagnosis not present

## 2017-07-26 DIAGNOSIS — Z7901 Long term (current) use of anticoagulants: Secondary | ICD-10-CM | POA: Diagnosis not present

## 2017-07-26 DIAGNOSIS — D45 Polycythemia vera: Secondary | ICD-10-CM

## 2017-07-26 DIAGNOSIS — Z79899 Other long term (current) drug therapy: Secondary | ICD-10-CM | POA: Insufficient documentation

## 2017-07-26 DIAGNOSIS — R0902 Hypoxemia: Secondary | ICD-10-CM | POA: Diagnosis not present

## 2017-07-26 DIAGNOSIS — I10 Essential (primary) hypertension: Secondary | ICD-10-CM | POA: Diagnosis not present

## 2017-07-26 DIAGNOSIS — D500 Iron deficiency anemia secondary to blood loss (chronic): Secondary | ICD-10-CM

## 2017-07-26 DIAGNOSIS — I5032 Chronic diastolic (congestive) heart failure: Secondary | ICD-10-CM | POA: Diagnosis not present

## 2017-07-26 DIAGNOSIS — Z87891 Personal history of nicotine dependence: Secondary | ICD-10-CM | POA: Insufficient documentation

## 2017-07-26 DIAGNOSIS — E039 Hypothyroidism, unspecified: Secondary | ICD-10-CM | POA: Diagnosis not present

## 2017-07-26 DIAGNOSIS — D539 Nutritional anemia, unspecified: Secondary | ICD-10-CM | POA: Insufficient documentation

## 2017-07-26 LAB — COMPREHENSIVE METABOLIC PANEL
ALK PHOS: 47 U/L (ref 38–126)
ALT: 30 U/L (ref 14–54)
AST: 24 U/L (ref 15–41)
Albumin: 3.8 g/dL (ref 3.5–5.0)
Anion gap: 6 (ref 5–15)
BUN: 37 mg/dL — ABNORMAL HIGH (ref 6–20)
CALCIUM: 10.9 mg/dL — AB (ref 8.9–10.3)
CO2: 32 mmol/L (ref 22–32)
CREATININE: 0.83 mg/dL (ref 0.44–1.00)
Chloride: 102 mmol/L (ref 101–111)
GFR calc non Af Amer: >60 mL/min (ref >60)
GLUCOSE: 98 mg/dL (ref 65–99)
Potassium: 4.2 mmol/L (ref 3.5–5.1)
SODIUM: 140 mmol/L (ref 135–145)
Total Bilirubin: 0.5 mg/dL (ref 0.3–1.2)
Total Protein: 5.5 g/dL — ABNORMAL LOW (ref 6.5–8.1)

## 2017-07-26 LAB — CBC WITH DIFFERENTIAL/PLATELET
BASOS PCT: 0 %
Basophils Absolute: 0 10*3/uL (ref 0.0–0.1)
EOS ABS: 0 10*3/uL (ref 0.0–0.7)
EOS PCT: 0 %
HEMATOCRIT: 36.4 % (ref 36.0–46.0)
HEMOGLOBIN: 11.6 g/dL — AB (ref 12.0–15.0)
LYMPHS PCT: 8 %
Lymphs Abs: 0.9 10*3/uL (ref 0.7–4.0)
MCH: 35.8 pg — AB (ref 26.0–34.0)
MCHC: 31.9 g/dL (ref 30.0–36.0)
MCV: 112.3 fL — ABNORMAL HIGH (ref 78.0–100.0)
MONOS PCT: 8 %
Monocytes Absolute: 0.9 10*3/uL (ref 0.1–1.0)
Neutro Abs: 10 10*3/uL — ABNORMAL HIGH (ref 1.7–7.7)
Neutrophils Relative %: 84 %
Platelets: 523 10*3/uL — ABNORMAL HIGH (ref 150–400)
RBC: 3.24 MIL/uL — ABNORMAL LOW (ref 3.87–5.11)
RDW: 16.3 % — ABNORMAL HIGH (ref 11.5–15.5)
WBC: 11.8 10*3/uL — ABNORMAL HIGH (ref 4.0–10.5)

## 2017-07-26 LAB — D-DIMER, QUANTITATIVE: D-Dimer, Quant: 0.28 mcg/mL FEU (ref <0.50)

## 2017-07-26 MED ORDER — OXYMETAZOLINE HCL 0.05 % NA SOLN
1.0000 | Freq: Once | NASAL | Status: AC
  Administered 2017-07-26: 1 via NASAL

## 2017-07-26 NOTE — Discharge Instructions (Addendum)
Return if any problems. See ENT for recheck in 2-3 days.  Call ENT to be seen if any further bleeding

## 2017-07-26 NOTE — ED Provider Notes (Signed)
[facility] EMERGENCY DEPARTMENT Provider Note   CSN: [id number] Arrival date & time: 07/26/17  0920     History   Chief Complaint Chief Complaint  Patient presents with  . Epistaxis    HPI Sarah Mays is a 84 y.o. female.  The history is provided by the patient. No language interpreter was used.  Epistaxis   This is a new problem. The current episode started 3 to 5 hours ago. The problem occurs constantly. The problem has been gradually worsening. The bleeding has been from the right nare. She has tried nothing for the symptoms. The treatment provided no relief. Her past medical history is significant for frequent nosebleeds.  Pt is on xarelto for atrial fib   Past Medical History:  Diagnosis Date  . Abnormal stress test    a. 11/2011 Ex MV: EF 80%, small, partially reversible anteroapical defect->mild ischemia vs attenuation-->med Rx.  [name] Kitchen Alopecia, unspecified   . Arthritis   . Chronic diastolic CHF (congestive heart failure) ([place])    a. 11/2013 Echo: EF 60-65%, no rwma, mild TR, PASP 25mmHg.  . Closed fracture of lower end of radius with ulna   . Deafness    Left, s/p multiple surgeries  . Essential hypertension   . Gouty arthropathy, unspecified   . History of kidney stones   . Hypothyroidism   . Insomnia, unspecified   . Macular degeneration    Left, s/p inj. tx  . Mitral valve disorders(424.0)   . Neck mass    right, w/u including MRI negative  . PAF (paroxysmal atrial fibrillation) (HCC)    a. on amio (02/2012 mild obstruction on PFT's) and xarelto.  . Polycythemia vera(238.4)   . PONV (postoperative nausea and vomiting)    no problem last surgery  . Pure hypercholesterolemia   . Senile osteoporosis    Reclast in the past  . Tricuspid valve disorders, specified as nonrheumatic     Patient Active Problem List   Diagnosis Date Noted  . Recurrent epistaxis 02/25/2017  . Iron deficiency anemia 02/21/2017  . Pure hypercholesterolemia    . PONV (postoperative nausea and vomiting)   . PAF (paroxysmal atrial fibrillation) ([place])   . Neck mass   . Insomnia, unspecified   . History of kidney stones   . Deafness   . Closed fracture of lower end of radius with ulna   . Arthritis   . Abnormal stress test   . Rotator cuff arthropathy, right 09/08/2016  . Trigger point of right shoulder region 05/05/2016  . Spondylosis of cervical region without myelopathy or radiculopathy 05/05/2016  . Chondrocalcinosis 05/05/2016  . Chronic pain disorder 05/05/2016  . A-fib ([place]) 01/08/2016  . Closed fracture of shaft of right femur, initial encounter ([place]) 10/25/2015  . Status post-operative repair of hip fracture 10/25/2015  . Greater trochanteric bursitis of right hip 09/29/2015  . Asymptomatic cholelithiasis 05/09/2015  . Essential hypertension 11/07/2014  . Hair loss 11/07/2014  . Bilateral leg edema 11/07/2014  . Constipation due to pain medication 11/06/2014  . Hypotension due to drugs 11/06/2014  . Chest pain 09/01/2014  . Osteoporosis 01/04/2014  . Gout 01/04/2014  . Headache 01/04/2014  . Hypothyroidism 11/19/2013  . Hypercalcemia 10/11/2013  . Insomnia 05/16/2013  . Lumbar back pain 05/16/2013  . Polycythemia vera ([place])   . Mass of right side of neck   . Senile osteoporosis   . Macular degeneration   . Sick sinus syndrome ([place]) 03/09/2012  . CAD (coronary  artery disease) 12/27/2011  . Paroxysmal atrial fibrillation ([place]) 12/27/2011  . Chronic diastolic CHF (congestive heart failure) ([place]) 12/27/2011  . HTN (hypertension) 12/27/2011    Past Surgical History:  Procedure Laterality Date  . ABLATION  08/28/14   Back   . BREAST BIOPSY  06/18/1998   left  . BREAST BIOPSY Right   . CARDIOVERSION N/A 07/15/2017   Procedure: CARDIOVERSION;  Surgeon: Larey Dresser, MD;  Location: [facility] ENDOSCOPY;  Service: Cardiovascular;  Laterality: N/A;  . ELECTROPHYSIOLOGIC STUDY N/A 01/08/2016   Procedure: Atrial Fibrillation  Ablation;  Surgeon: Thompson [place]er, MD;  Location: [facility] CV LAB;  Service: Cardiovascular;  Laterality: N/A;  . FEMUR IM NAIL Right 10/08/2015   Procedure: INTRAMEDULLARY (IM) NAIL FEMORAL RIGHT;  Surgeon: Paralee Cancel, MD;  Location: [facility] OR[facility];  Service: Orthopedics;  Laterality: Right;  . MASS EXCISION Right 11/26/2016   Procedure: EXCISION RIGHT LATERAL NECK MASS;  Surgeon: Jerrell Belfast, MD;  Location: [facility];  Service: ENT;  Laterality: Right;  . TEE WITHOUT CARDIOVERSION N/A 07/15/2017   Procedure: TRANSESOPHAGEAL ECHOCARDIOGRAM (TEE);  Surgeon: Larey Dresser, MD;  Location[facility] ENDOSCOPY;  Service: Cardiovascular;  Laterality: N/A;  . TONSILLECTOMY    . TONSILLECTOMY    . TOTAL ABDOMINAL HYSTERECTOMY    . TYMPANOPLASTY Bilateral      OB History   None      Home Medications    Prior to Admission medications   Medication Sig Start Date End Date Taking? Authorizing Provider  Cholecalciferol (VITAMIN D) 2000 units CAPS Take 2,000 Units by mouth daily.    Yes [provider]  docusate sodium (COLACE) 50 MG capsule Take 100 mg by mouth 2 (two) times daily.   Yes [provider]  fentaNYL (DURAGESIC - DOSED MCG/HR) 25 MCG/HR patch Place 1 patch (25 mcg total) onto the skin every 3 (three) days. 07/11/17  Yes Reed, Tiffany L, DO  furosemide (LASIX) 40 MG tablet Take 2 tablets (80 mg total) by mouth daily. 05/02/17  Yes Larey Dresser, MD  gabapentin (NEURONTIN) 100 MG capsule Take 100 mg by mouth at bedtime.   Yes [provider]  hydrALAZINE (APRESOLINE) 50 MG tablet TAKE 1 1/2 TABLETS THREE TIMES DAILY. Patient taking differently: TAKE 75mg  BY MOUTH THREE TIMES DAILY. 07/25/17  Yes Larey Dresser, MD  hydroxyurea (HYDREA) 500 MG capsule TAKE 2 CAPSULES ON MON. , WED. AND FRI.[name] Kitchen 1 CAPSULE ALL OTHER DAYS OF THE WEEK. 06/06/17  Yes Gorsuch, Ni, MD  levothyroxine (SYNTHROID, LEVOTHROID) 100 MCG tablet Take 1 tablet (100 mcg total) by mouth daily before  breakfast. 06/30/17  Yes Reed, Tiffany L, DO  Magnesium 250 MG TABS Take 250 mg by mouth daily.    Yes [provider]  Melatonin 10 MG TABS Take 10 mg by mouth at bedtime.   Yes [provider]  Multiple Vitamins-Minerals (PRESERVISION AREDS 2) CAPS Take 1 capsule by mouth 2 (two) times daily.    Yes [provider]  polycarbophil (FIBERCON) 625 MG tablet Take 2 tablets (1,250 mg total) by mouth 2 (two) times daily. 10/10/15  Yes Arrien, Jimmy Picket, MD  potassium chloride (K-DUR) 10 MEQ tablet TAKE 1 TABLET BY MOUTH TWICE DAILY. 07/18/17  Yes Reed, Tiffany L, DO  predniSONE (DELTASONE) 20 MG tablet Take 2 tablets (40 mg total) by mouth daily with breakfast. 07/22/17  Yes Lyndal Pulley, DO  sodium chloride (OCEAN) 0.65 % SOLN nasal spray Place 2 sprays into both nostrils as  needed for congestion.   Yes [provider]  traMADol (ULTRAM) 50 MG tablet TAKE ONE TABLET AT BEDTIME. 07/22/17  Yes Reed, Tiffany L, DO  XARELTO 15 MG TABS tablet TAKE 1 TABLET ONCE DAILY. 07/19/17  Yes Larey Dresser, MD    Family History Family History  Problem Relation Age of Onset  . Hypertension Father   . Heart disease Father        patient does not know details.   . Ovarian cancer Mother 36  . Cancer Sister        colon  . Heart disease Sister   . Cancer Sister   . Cancer Sister        hodgkin disease  . Breast cancer Daughter   . Cancer Daughter        breast    Social History Social History   Tobacco Use  . Smoking status: Former Smoker    Types: Cigarettes    Last attempt to quit: 01/19/1984    Years since quitting: 33.5  . Smokeless tobacco: Never Used  Substance Use Topics  . Alcohol use: Yes    Alcohol/week: 0.6 oz    Types: 1 Glasses of wine per week    Comment: 2 per day/ 14 a week  . Drug use: No     Allergies   Amlodipine   Review of Systems Review of Systems  HENT: Positive for nosebleeds.   All other systems reviewed and are  negative.    Physical Exam Updated Vital Signs BP (!) 144/69 (BP Location: Right Arm)   Pulse 66   Temp 98.3 F (36.8 C) (Oral)   Resp 16   Ht 5' (1.524 m)   Wt 49 kg (108 lb)   SpO2 97%   BMI 21.09 kg/m   Physical Exam  Constitutional: She is oriented to person, place, and time. She appears well-developed and well-nourished.  HENT:  No bleeding on initial exam.  Pt observed,  Pt had more bleeding.   Cardiovascular: Normal rate.  Pulmonary/Chest: Effort normal.  Neurological: She is alert and oriented to person, place, and time.  Skin: Skin is warm.  Psychiatric: She has a normal mood and affect.  Nursing note and vitals reviewed.    ED Treatments / Results  Labs (all labs ordered are listed, but only abnormal results are displayed) Labs Reviewed  CBC WITH DIFFERENTIAL/PLATELET - Abnormal; Notable for the following components:      Result Value   WBC 11.8 (*)    RBC 3.24 (*)    Hemoglobin 11.6 (*)    MCV 112.3 (*)    MCH 35.8 (*)    RDW 16.3 (*)    Platelets 523 (*)    Neutro Abs 10.0 (*)    All other components within normal limits  COMPREHENSIVE METABOLIC PANEL - Abnormal; Notable for the following components:   BUN 37 (*)    Calcium 10.9 (*)    Total Protein 5.5 (*)    All other components within normal limits    EKG None  Radiology No results found.  Procedures .Epistaxis Management Date/Time: 07/26/2017 12:32 PM Performed by: Fransico Meadow, PA-C Authorized by: Fransico Meadow, PA-C   Consent:    Consent obtained:  Verbal   Consent given by:  Patient Anesthesia (see MAR for exact dosages):    Anesthesia method:  None Procedure details:    Treatment site:  R anterior   Treatment method:  Nasal balloon Post-procedure details:  Patient tolerance of procedure:  Tolerated well, no immediate complications   (including critical care time)  Medications Ordered in ED Medications  oxymetazoline (AFRIN) 0.05 % nasal spray 1 spray (has no  administration in time range)     Initial Impression / Assessment and Plan / ED Course  I have reviewed the triage vital signs and the nursing notes.  Pertinent labs & imaging results that were available during my care of the patient were reviewed by me and considered in my medical decision making (see chart for details).     I spoke to Dr. Wilburn Cornelia.  Pt advised to call office to go to his office for further bleeding.   Recheck in 2 days   Final Clinical Impressions(s) / ED Diagnoses   Final diagnoses:  Right-sided epistaxis    ED Discharge Orders    None    An After Visit Summary was printed and given to the patient.   Sidney Ace 07/26/17 1233    Carmin Muskrat, MD 07/26/17 [phone]

## 2017-07-26 NOTE — ED Notes (Signed)
Pt ambulated to bathroom with walker. No balance issues/weakness noted; Pt did not complain of dizziness or nausea.

## 2017-07-26 NOTE — ED Triage Notes (Signed)
Pt brought in by EMS due to having a nosebleed that lasted approximately 1 hour. Pt woke up at 7:15 and nose was bleeding. Facility gave pt 2 sprays of affrent. Pt has nosebleed frequently. Pt takes xarleto and last dose was yesterday at dinner.

## 2017-07-27 ENCOUNTER — Other Ambulatory Visit: Payer: Self-pay | Admitting: *Deleted

## 2017-07-27 ENCOUNTER — Telehealth: Payer: Self-pay | Admitting: *Deleted

## 2017-07-27 ENCOUNTER — Encounter (HOSPITAL_COMMUNITY): Payer: Self-pay | Admitting: Nurse Practitioner

## 2017-07-27 ENCOUNTER — Telehealth: Payer: Self-pay | Admitting: Family Medicine

## 2017-07-27 ENCOUNTER — Telehealth: Payer: Self-pay | Admitting: Hematology and Oncology

## 2017-07-27 ENCOUNTER — Ambulatory Visit (HOSPITAL_COMMUNITY)
Admission: RE | Admit: 2017-07-27 | Discharge: 2017-07-27 | Disposition: A | Discharge status: Home / Self Care | Payer: Medicare Other | Source: Ambulatory Visit | Attending: Nurse Practitioner | Admitting: Nurse Practitioner

## 2017-07-27 ENCOUNTER — Encounter: Payer: Self-pay | Admitting: Family Medicine

## 2017-07-27 VITALS — BP 124/72 | HR 67 | Ht 60.0 in | Wt 106.0 lb

## 2017-07-27 DIAGNOSIS — Z79899 Other long term (current) drug therapy: Secondary | ICD-10-CM | POA: Diagnosis not present

## 2017-07-27 DIAGNOSIS — E039 Hypothyroidism, unspecified: Secondary | ICD-10-CM | POA: Insufficient documentation

## 2017-07-27 DIAGNOSIS — I10 Essential (primary) hypertension: Secondary | ICD-10-CM | POA: Diagnosis not present

## 2017-07-27 DIAGNOSIS — Z7952 Long term (current) use of systemic steroids: Secondary | ICD-10-CM | POA: Insufficient documentation

## 2017-07-27 DIAGNOSIS — Z90710 Acquired absence of both cervix and uterus: Secondary | ICD-10-CM | POA: Insufficient documentation

## 2017-07-27 DIAGNOSIS — I110 Hypertensive heart disease with heart failure: Secondary | ICD-10-CM | POA: Insufficient documentation

## 2017-07-27 DIAGNOSIS — Z79890 Hormone replacement therapy (postmenopausal): Secondary | ICD-10-CM | POA: Diagnosis not present

## 2017-07-27 DIAGNOSIS — I5032 Chronic diastolic (congestive) heart failure: Secondary | ICD-10-CM | POA: Insufficient documentation

## 2017-07-27 DIAGNOSIS — R0789 Other chest pain: Secondary | ICD-10-CM

## 2017-07-27 DIAGNOSIS — I480 Paroxysmal atrial fibrillation: Secondary | ICD-10-CM | POA: Insufficient documentation

## 2017-07-27 DIAGNOSIS — E7800 Pure hypercholesterolemia, unspecified: Secondary | ICD-10-CM | POA: Insufficient documentation

## 2017-07-27 DIAGNOSIS — Z7901 Long term (current) use of anticoagulants: Secondary | ICD-10-CM | POA: Insufficient documentation

## 2017-07-27 DIAGNOSIS — M5412 Radiculopathy, cervical region: Secondary | ICD-10-CM

## 2017-07-27 DIAGNOSIS — Z87891 Personal history of nicotine dependence: Secondary | ICD-10-CM | POA: Diagnosis not present

## 2017-07-27 DIAGNOSIS — M109 Gout, unspecified: Secondary | ICD-10-CM | POA: Insufficient documentation

## 2017-07-27 DIAGNOSIS — R040 Epistaxis: Secondary | ICD-10-CM | POA: Insufficient documentation

## 2017-07-27 DIAGNOSIS — I4819 Other persistent atrial fibrillation: Secondary | ICD-10-CM

## 2017-07-27 DIAGNOSIS — R9431 Abnormal electrocardiogram [ECG] [EKG]: Secondary | ICD-10-CM | POA: Insufficient documentation

## 2017-07-27 DIAGNOSIS — I495 Sick sinus syndrome: Secondary | ICD-10-CM | POA: Diagnosis not present

## 2017-07-27 DIAGNOSIS — Z888 Allergy status to other drugs, medicaments and biological substances status: Secondary | ICD-10-CM | POA: Insufficient documentation

## 2017-07-27 DIAGNOSIS — I481 Persistent atrial fibrillation: Secondary | ICD-10-CM | POA: Diagnosis not present

## 2017-07-27 NOTE — Telephone Encounter (Signed)
Patient called and stated that she was scheduled for labs yesterday at [facility] but she was in the ER and couldn't have them done. Patient stated that she needs the labs rescheduled. Please call to reschedule lab appointment at [facility].

## 2017-07-27 NOTE — Telephone Encounter (Signed)
Called pt re apps that were added per 6/18 sch msg - spoke w/ pt re appts.

## 2017-07-27 NOTE — Telephone Encounter (Signed)
Copied from [place] [phone]. Topic: Quick Communication - See Telephone Encounter >> Jul 27, 2017  2:03 PM Boyd Kerbs wrote: CRM for notification. See Telephone encounter for: 07/27/17.  Prentiss Bells from [place] [phone] needing to have referral changed to CT scan w/o   can not do both.

## 2017-07-27 NOTE — Progress Notes (Signed)
Primary Care Physician: Gayland Curry, DO Primary Cardiologist: Aundra Dubin Primary Electrophysiologist: Rayann Heman  Sarah Mays is a 84 y.o. female with a history of persistent atrial fibrillation who presents for follow up in the [facility].  Since last being seen in clinic, the patient reports doing relatively well.  She underwent DCCV 07/15/17 after recurrence of AF.  She was in the ER last night for epistaxis and her nose was packed. Her Xarelto was held yesterday and today with plans to resume Xarelto tomorrow.   Today, she denies symptoms of palpitations, chest pain, shortness of breath, orthopnea, PND, lower extremity edema, dizziness, presyncope, syncope, snoring, daytime somnolence, bleeding, or neurologic sequela. The patient is tolerating medications without difficulties and is otherwise without complaint today.    Atrial Fibrillation Risk Factors:  she does not have symptoms or diagnosis of sleep apnea.  she does not have a history of rheumatic fever.  she does not have a history of alcohol use.  she has a BMI of Body mass index is 20.7 kg/m.[name] Kitchen Filed Weights   07/27/17 1442  Weight: 106 lb (48.1 kg)    LA size: 38   Atrial Fibrillation Management history:  Previous antiarrhythmic drugs: amiodarone  Previous cardioversions: 2019  Previous ablations: 2017  CHADS2VASC score: 4  Anticoagulation history: Xarelto   Past Medical History:  Diagnosis Date  . Abnormal stress test    a. 11/2011 Ex MV: EF 80%, small, partially reversible anteroapical defect->mild ischemia vs attenuation-->med Rx.  [name] Kitchen Alopecia, unspecified   . Arthritis   . Chronic diastolic CHF (congestive heart failure) ([place])    a. 11/2013 Echo: EF 60-65%, no rwma, mild TR, PASP 84mmHg.  . Closed fracture of lower end of radius with ulna   . Deafness    Left, s/p multiple surgeries  . Essential hypertension   . Gouty arthropathy, unspecified   . History of kidney  stones   . Hypothyroidism   . Insomnia, unspecified   . Macular degeneration    Left, s/p inj. tx  . Mitral valve disorders(424.0)   . Neck mass    right, w/u including MRI negative  . PAF (paroxysmal atrial fibrillation) (HCC)    a. on amio (02/2012 mild obstruction on PFT's) and xarelto.  . Polycythemia vera(238.4)   . Pure hypercholesterolemia   . Senile osteoporosis    Reclast in the past  . Tricuspid valve disorders, specified as nonrheumatic    Past Surgical History:  Procedure Laterality Date  . BREAST BIOPSY  06/18/1998   left  . CARDIOVERSION N/A 07/15/2017   Procedure: CARDIOVERSION;  Surgeon: Larey Dresser, MD;  Location: [facility] ENDOSCOPY;  Service: Cardiovascular;  Laterality: N/A;  . ELECTROPHYSIOLOGIC STUDY N/A 01/08/2016   Procedure: Atrial Fibrillation Ablation;  Surgeon: Thompson [place]er, MD;  Location: [facility] CV LAB;  Service: Cardiovascular;  Laterality: N/A;  . FEMUR IM NAIL Right 10/08/2015   Procedure: INTRAMEDULLARY (IM) NAIL FEMORAL RIGHT;  Surgeon: Paralee Cancel, MD;  Location: [facility] OR[facility];  Service: Orthopedics;  Laterality: Right;  . MASS EXCISION Right 11/26/2016   Procedure: EXCISION RIGHT LATERAL NECK MASS;  Surgeon: Jerrell Belfast, MD;  Location: [facility];  Service: ENT;  Laterality: Right;  . TEE WITHOUT CARDIOVERSION N/A 07/15/2017   Procedure: TRANSESOPHAGEAL ECHOCARDIOGRAM (TEE);  Surgeon: Larey Dresser, MD;  Location: [facility] ENDOSCOPY;  Service: Cardiovascular;  Laterality: N/A;  . TONSILLECTOMY    . TOTAL ABDOMINAL HYSTERECTOMY    . TYMPANOPLASTY Bilateral  Current Outpatient Medications  Medication Sig Dispense Refill  . Cholecalciferol (VITAMIN D) 2000 units CAPS Take 2,000 Units by mouth daily.     [name] Kitchen docusate sodium (COLACE) 50 MG capsule Take 100 mg by mouth 2 (two) times daily.    . fentaNYL (DURAGESIC - DOSED MCG/HR) 25 MCG/HR patch Place 1 patch (25 mcg total) onto the skin every 3 (three) days. 10 patch 0  . furosemide (LASIX) 40 MG tablet Take  2 tablets (80 mg total) by mouth daily. 60 tablet 3  . gabapentin (NEURONTIN) 100 MG capsule Take 100 mg by mouth at bedtime.    . hydrALAZINE (APRESOLINE) 50 MG tablet TAKE 1 1/2 TABLETS THREE TIMES DAILY. (Patient taking differently: TAKE 75mg  BY MOUTH THREE TIMES DAILY.) 135 tablet 2  . hydroxyurea (HYDREA) 500 MG capsule TAKE 2 CAPSULES ON MON. , WED. AND FRI.[name] Kitchen 1 CAPSULE ALL OTHER DAYS OF THE WEEK. 90 capsule 0  . levothyroxine (SYNTHROID, LEVOTHROID) 100 MCG tablet Take 1 tablet (100 mcg total) by mouth daily before breakfast. 90 tablet 0  . Magnesium 250 MG TABS Take 250 mg by mouth daily.     . Melatonin 10 MG TABS Take 10 mg by mouth at bedtime.    . Multiple Vitamins-Minerals (PRESERVISION AREDS 2) CAPS Take 1 capsule by mouth 2 (two) times daily.     . polycarbophil (FIBERCON) 625 MG tablet Take 2 tablets (1,250 mg total) by mouth 2 (two) times daily. 60 tablet 0  . potassium chloride (K-DUR) 10 MEQ tablet TAKE 1 TABLET BY MOUTH TWICE DAILY. 60 tablet 3  . predniSONE (DELTASONE) 20 MG tablet Take 2 tablets (40 mg total) by mouth daily with breakfast. 10 tablet 0  . sodium chloride (OCEAN) 0.65 % SOLN nasal spray Place 2 sprays into both nostrils as needed for congestion.    . traMADol (ULTRAM) 50 MG tablet TAKE ONE TABLET AT BEDTIME. 30 tablet 0  . XARELTO 15 MG TABS tablet TAKE 1 TABLET ONCE DAILY. (Patient not taking: Reported on 07/27/2017) 30 tablet 6   No current facility-administered medications for this encounter.     Allergies  Allergen Reactions  . Amlodipine Swelling and Other (See Comments)    Reaction:  Lower extremity swelling     Social History   Socioeconomic History  . Marital status: Married    Spouse name: [name]  . Number of children: 2  . Years of education: 52  . Highest education level: Not on file  Occupational History  . Not on file  Social Needs  . Financial resource strain: Not on file  . Food insecurity:    Worry: Not on file    Inability: Not  on file  . Transportation needs:    Medical: Not on file    Non-medical: Not on file  Tobacco Use  . Smoking status: Former Smoker    Types: Cigarettes    Last attempt to quit: 01/19/1984    Years since quitting: 33.5  . Smokeless tobacco: Never Used  Substance and Sexual Activity  . Alcohol use: Yes    Alcohol/week: 0.6 oz    Types: 1 Glasses of wine per week    Comment: 2 per day/ 14 a week  . Drug use: No  . Sexual activity: Not Currently  Lifestyle  . Physical activity:    Days per week: Not on file    Minutes per session: Not on file  . Stress: Not on file  Relationships  . Social connections:  Talks on phone: Not on file    Gets together: Not on file    Attends religious service: Not on file    Active member of club or organization: Not on file    Attends meetings of clubs or organizations: Not on file    Relationship status: Not on file  . Intimate partner violence:    Fear of current or ex partner: Not on file    Emotionally abused: Not on file    Physically abused: Not on file    Forced sexual activity: Not on file  Other Topics Concern  . Not on file  Social History Narrative   Patient is Married 1955. 2 kids. 30 grandkid-35 years old in 2016. College graduate [name] [place]).    Lives in apartment,  Independent Living  section at [facility] since 02/2013.      Stay at home mother.       No Smoking history, Mod. alcohol use.   Patient has a living will, POA      Hobbies: CPU and painting          Family History  Problem Relation Age of Onset  . Hypertension Father   . Heart disease Father        patient does not know details.   . Ovarian cancer Mother 66  . Cancer Sister        colon  . Heart disease Sister   . Cancer Sister   . Cancer Sister        hodgkin disease  . Breast cancer Daughter   . Cancer Daughter        breast    ROS- All systems are reviewed and negative except as per the HPI above.  Physical  Exam: Vitals:   07/27/17 1442  BP: 124/72  Pulse: 67  Weight: 106 lb (48.1 kg)  Height: 5' (1.524 m)    GEN- The patient is elderly appearing, alert and oriented x 3 today.   Head- normocephalic, atraumatic, +right nares packing Eyes-  Sclera clear, conjunctiva pink Ears- hearing intact Oropharynx- clear Neck- supple  Lungs- Clear to ausculation bilaterally, normal work of breathing Heart- Regular rate and rhythm  GI- soft, NT, ND, + BS Extremities- no clubbing, cyanosis, or edema MS- no significant deformity or atrophy Skin- no rash or lesion Psych- euthymic mood, full affect Neuro- strength and sensation are intact  Wt Readings from Last 3 Encounters:  07/27/17 106 lb (48.1 kg)  07/26/17 108 lb (49 kg)  07/25/17 108 lb (49 kg)    EKG today demonstrates sinus rhythm, rate 67, normal intervals  Epic records are reviewed at length today  Assessment and Plan:  1. Persistent atrial fibrillation Maintaining SR after recent cardioversion. She unfortunately has had recurrent epistaxis with nasal packing last night.  Xarelto held yesterday and today. She has follow up with ENT on Monday (Dr Wilburn Cornelia).  With recent cardioversion, we really need uninterrupted anticoagulation for 4 weeks. Restart Xarelto cautiously tomorrow with close ENT follow up.  Continue Xarelto for CHADS2VASC of 4 Dr Rayann Heman has considered stopping anticoagulation in the past for recurrent epistaxis per his notes.   2.  Sick sinus syndrome Resolved off amiodarone  3.  HTN Stable No change required today  Follow up with Dr Aundra Dubin and Dr Rayann Heman as scheduled.  I have asked her to call after seeing Dr Wilburn Cornelia with his recommendations.   Chanetta Marshall, NP 07/27/2017 3:28 PM

## 2017-07-27 NOTE — Progress Notes (Deleted)
[facility] [address] [place], [place] [address] Phone: [phone] Subjective:    I'm seeing this patient by the request  of:    CC:   [id number]  Sarah Mays is a 84 y.o. female coming in with complaint of ***  Onset-  Location Duration-  Character- Aggravating factors- Reliving factors-  Therapies tried-  Severity-     Past Medical History:  Diagnosis Date  . Abnormal stress test    a. 11/2011 Ex MV: EF 80%, small, partially reversible anteroapical defect->mild ischemia vs attenuation-->med Rx.  [name] Kitchen Alopecia, unspecified   . Arthritis   . Chronic diastolic CHF (congestive heart failure) ([facility])    a. 11/2013 Echo: EF 60-65%, no rwma, mild TR, PASP 67mmHg.  . Closed fracture of lower end of radius with ulna   . Deafness    Left, s/p multiple surgeries  . Essential hypertension   . Gouty arthropathy, unspecified   . History of kidney stones   . Hypothyroidism   . Insomnia, unspecified   . Macular degeneration    Left, s/p inj. tx  . Mitral valve disorders(424.0)   . Neck mass    right, w/u including MRI negative  . PAF (paroxysmal atrial fibrillation) (HCC)    a. on amio (02/2012 mild obstruction on PFT's) and xarelto.  . Polycythemia vera(238.4)   . PONV (postoperative nausea and vomiting)    no problem last surgery  . Pure hypercholesterolemia   . Senile osteoporosis    Reclast in the past  . Tricuspid valve disorders, specified as nonrheumatic    Past Surgical History:  Procedure Laterality Date  . ABLATION  08/28/14   Back   . BREAST BIOPSY  06/18/1998   left  . BREAST BIOPSY Right   . CARDIOVERSION N/A 07/15/2017   Procedure: CARDIOVERSION;  Surgeon: Larey Dresser, MD;  Location: [facility] ENDOSCOPY;  Service: Cardiovascular;  Laterality: N/A;  . ELECTROPHYSIOLOGIC STUDY N/A 01/08/2016   Procedure: Atrial Fibrillation Ablation;  Surgeon: Thompson Grayer, MD;  Location: [facility] CV LAB;  Service: Cardiovascular;   Laterality: N/A;  . FEMUR IM NAIL Right 10/08/2015   Procedure: INTRAMEDULLARY (IM) NAIL FEMORAL RIGHT;  Surgeon: Paralee Cancel, MD;  Location: [facility] ORS;  Service: Orthopedics;  Laterality: Right;  . MASS EXCISION Right 11/26/2016   Procedure: EXCISION RIGHT LATERAL NECK MASS;  Surgeon: Jerrell Belfast, MD;  Location: [facility];  Service: ENT;  Laterality: Right;  . TEE WITHOUT CARDIOVERSION N/A 07/15/2017   Procedure: TRANSESOPHAGEAL ECHOCARDIOGRAM (TEE);  Surgeon: Larey Dresser, MD;  Location: [facility] ENDOSCOPY;  Service: Cardiovascular;  Laterality: N/A;  . TONSILLECTOMY    . TONSILLECTOMY    . TOTAL ABDOMINAL HYSTERECTOMY    . TYMPANOPLASTY Bilateral    Social History   Socioeconomic History  . Marital status: Married    Spouse name: [name]  . Number of children: 2  . Years of education: 54  . Highest education level: Not on file  Occupational History  . Not on file  Social Needs  . Financial resource strain: Not on file  . Food insecurity:    Worry: Not on file    Inability: Not on file  . Transportation needs:    Medical: Not on file    Non-medical: Not on file  Tobacco Use  . Smoking status: Former Smoker    Types: Cigarettes    Last attempt to quit: 01/19/1984    Years since quitting: 33.5  . Smokeless tobacco: Never  Used  Substance and Sexual Activity  . Alcohol use: Yes    Alcohol/week: 0.6 oz    Types: 1 Glasses of wine per week    Comment: 2 per day/ 14 a week  . Drug use: No  . Sexual activity: Not Currently  Lifestyle  . Physical activity:    Days per week: Not on file    Minutes per session: Not on file  . Stress: Not on file  Relationships  . Social connections:    Talks on phone: Not on file    Gets together: Not on file    Attends religious service: Not on file    Active member of club or organization: Not on file    Attends meetings of clubs or organizations: Not on file    Relationship status: Not on file  Other Topics Concern  . Not on file  Social  History Narrative   Patient is Married 1955. 2 kids. 46 grandkid-39 years old in 2016. College graduate [employer] Mays. [employer] [place]).    Lives in apartment,  Independent Living  section at [facility] since 02/2013.      Stay at home mother.       No Smoking history, Mod. alcohol use.   Patient has a living will, POA      Hobbies: CPU and painting         Allergies  Allergen Reactions  . Amlodipine Swelling and Other (See Comments)    Reaction:  Lower extremity swelling    Family History  Problem Relation Age of Onset  . Hypertension Father   . Heart disease Father        patient does not know details.   . Ovarian cancer Mother 79  . Cancer Sister        colon  . Heart disease Sister   . Cancer Sister   . Cancer Sister        hodgkin disease  . Breast cancer Daughter   . Cancer Daughter        breast     Past medical history, social, surgical and family history all reviewed in electronic medical record.  No pertanent information unless stated regarding to the chief complaint.   Review of Systems:Review of systems updated and as accurate as of 07/27/17  No headache, visual changes, nausea, vomiting, diarrhea, constipation, dizziness, abdominal pain, skin rash, fevers, chills, night sweats, weight loss, swollen lymph nodes, body aches, joint swelling, muscle aches, chest pain, shortness of breath, mood changes.   Objective  There were no vitals taken for this visit. Systems examined below as of 07/27/17   General: No apparent distress alert and oriented x3 mood and affect normal, dressed appropriately.  HEENT: Pupils equal, extraocular movements intact  Respiratory: Patient's speak in full sentences and does not appear short of breath  Cardiovascular: No lower extremity edema, non tender, no erythema  Skin: Warm dry intact with no signs of infection or rash on extremities or on axial skeleton.  Abdomen: Soft nontender  Neuro: Cranial nerves II through XII are  intact, neurovascularly intact in all extremities with 2+ DTRs and 2+ pulses.  Lymph: No lymphadenopathy of posterior or anterior cervical chain or axillae bilaterally.  Gait normal with good balance and coordination.  MSK:  Non tender with full range of motion and good stability and symmetric strength and tone of shoulders, elbows, wrist, hip, knee and ankles bilaterally.     Impression and Recommendations:     This case  required medical decision making of moderate complexity.      Note: This dictation was prepared with Dragon dictation along with smaller phrase technology. Any transcriptional errors that result from this process are unintentional.

## 2017-07-27 NOTE — Telephone Encounter (Signed)
Spoke with patient and advised that she can come tomorrow morning here at [facility] to get labs done. 07/28/17 @ 8am

## 2017-07-27 NOTE — Telephone Encounter (Signed)
Order changed.

## 2017-07-28 ENCOUNTER — Ambulatory Visit (INDEPENDENT_AMBULATORY_CARE_PROVIDER_SITE_OTHER)
Admission: RE | Admit: 2017-07-28 | Discharge: 2017-07-28 | Disposition: A | Discharge status: Home / Self Care | Payer: Medicare Other | Source: Ambulatory Visit | Attending: Family Medicine | Admitting: Family Medicine

## 2017-07-28 ENCOUNTER — Ambulatory Visit: Payer: Medicare Other | Admitting: Family Medicine

## 2017-07-28 DIAGNOSIS — M50223 Other cervical disc displacement at C6-C7 level: Secondary | ICD-10-CM | POA: Diagnosis not present

## 2017-07-28 DIAGNOSIS — R0789 Other chest pain: Secondary | ICD-10-CM | POA: Diagnosis not present

## 2017-07-28 DIAGNOSIS — G8929 Other chronic pain: Secondary | ICD-10-CM | POA: Diagnosis not present

## 2017-07-28 DIAGNOSIS — R079 Chest pain, unspecified: Secondary | ICD-10-CM | POA: Diagnosis not present

## 2017-07-28 DIAGNOSIS — M4802 Spinal stenosis, cervical region: Secondary | ICD-10-CM | POA: Diagnosis not present

## 2017-07-28 DIAGNOSIS — E039 Hypothyroidism, unspecified: Secondary | ICD-10-CM | POA: Diagnosis not present

## 2017-07-28 DIAGNOSIS — M25511 Pain in right shoulder: Secondary | ICD-10-CM

## 2017-07-28 DIAGNOSIS — I10 Essential (primary) hypertension: Secondary | ICD-10-CM | POA: Diagnosis not present

## 2017-07-28 DIAGNOSIS — R739 Hyperglycemia, unspecified: Secondary | ICD-10-CM | POA: Diagnosis not present

## 2017-07-28 DIAGNOSIS — E785 Hyperlipidemia, unspecified: Secondary | ICD-10-CM | POA: Diagnosis not present

## 2017-07-28 LAB — HEPATIC FUNCTION PANEL
ALT: 30 (ref 7–35)
AST: 23 (ref 13–35)
Alkaline Phosphatase: 62 (ref 25–125)
Bilirubin, Total: 0.5

## 2017-07-28 LAB — BASIC METABOLIC PANEL
BUN: 38 — AB (ref 4–21)
Creatinine: 0.7 (ref 0.5–1.1)
Glucose: 87
Potassium: 5 (ref 3.4–5.3)
Sodium: 141 (ref 137–147)

## 2017-07-28 LAB — LIPID PANEL
Cholesterol: 194 (ref 0–200)
HDL: 72 — AB (ref 35–70)
LDL Cholesterol: 102
Triglycerides: 99 (ref 40–160)

## 2017-07-28 LAB — CBC AND DIFFERENTIAL
HCT: 41 (ref 36–46)
Hemoglobin: 13.4 (ref 12.0–16.0)
Platelets: 595 — AB (ref 150–399)
WBC: 15.3

## 2017-07-28 LAB — TSH: TSH: 1.37 (ref 0.41–5.90)

## 2017-07-29 ENCOUNTER — Inpatient Hospital Stay: Payer: Medicare Other

## 2017-07-29 ENCOUNTER — Telehealth: Payer: Self-pay

## 2017-07-29 ENCOUNTER — Inpatient Hospital Stay: Payer: Medicare Other | Attending: Hematology and Oncology | Admitting: Hematology and Oncology

## 2017-07-29 DIAGNOSIS — D539 Nutritional anemia, unspecified: Secondary | ICD-10-CM

## 2017-07-29 DIAGNOSIS — D500 Iron deficiency anemia secondary to blood loss (chronic): Secondary | ICD-10-CM

## 2017-07-29 DIAGNOSIS — D509 Iron deficiency anemia, unspecified: Secondary | ICD-10-CM | POA: Insufficient documentation

## 2017-07-29 DIAGNOSIS — D45 Polycythemia vera: Secondary | ICD-10-CM

## 2017-07-29 DIAGNOSIS — I480 Paroxysmal atrial fibrillation: Secondary | ICD-10-CM | POA: Diagnosis not present

## 2017-07-29 DIAGNOSIS — M25511 Pain in right shoulder: Secondary | ICD-10-CM | POA: Diagnosis not present

## 2017-07-29 DIAGNOSIS — G8929 Other chronic pain: Secondary | ICD-10-CM | POA: Diagnosis not present

## 2017-07-29 DIAGNOSIS — R040 Epistaxis: Secondary | ICD-10-CM

## 2017-07-29 LAB — CBC WITH DIFFERENTIAL/PLATELET
Basophils Absolute: 0 10*3/uL (ref 0.0–0.1)
Basophils Relative: 0 %
EOS ABS: 0.1 10*3/uL (ref 0.0–0.5)
Eosinophils Relative: 1 %
HEMATOCRIT: 38.2 % (ref 34.8–46.6)
HEMOGLOBIN: 12.4 g/dL (ref 11.6–15.9)
Lymphocytes Relative: 7 %
Lymphs Abs: 1 10*3/uL (ref 0.9–3.3)
MCH: 36.8 pg — ABNORMAL HIGH (ref 25.1–34.0)
MCHC: 32.5 g/dL (ref 31.5–36.0)
MCV: 113.4 fL — ABNORMAL HIGH (ref 79.5–101.0)
Monocytes Absolute: 1.1 10*3/uL — ABNORMAL HIGH (ref 0.1–0.9)
Monocytes Relative: 8 %
NEUTROS PCT: 84 %
Neutro Abs: 11 10*3/uL — ABNORMAL HIGH (ref 1.5–6.5)
Platelets: 543 10*3/uL — ABNORMAL HIGH (ref 145–400)
RBC: 3.37 MIL/uL — ABNORMAL LOW (ref 3.70–5.45)
RDW: 16.9 % — ABNORMAL HIGH (ref 11.2–14.5)
WBC: 13.2 10*3/uL — ABNORMAL HIGH (ref 3.9–10.3)

## 2017-07-29 LAB — IRON AND TIBC
Iron: 99 ug/dL (ref 41–142)
SATURATION RATIOS: 31 % (ref 21–57)
TIBC: 323 ug/dL (ref 236–444)
UIBC: 224 ug/dL

## 2017-07-29 LAB — VITAMIN B12: Vitamin B-12: 352 pg/mL (ref 180–914)

## 2017-07-29 LAB — SEDIMENTATION RATE: Sed Rate: 2 mm/hr (ref 0–22)

## 2017-07-29 LAB — FERRITIN: FERRITIN: 44 ng/mL (ref 9–269)

## 2017-07-29 NOTE — Telephone Encounter (Signed)
   [facility] Pre-operative Risk Assessment    Request for surgical clearance:  1. What type of surgery is being performed? "ESI"  2. When is this surgery scheduled? Pending   3. What type of clearance is required (medical clearance vs. Pharmacy clearance to hold med vs. Both)? both  4. Are there any medications that need to be held prior to surgery and how long? Xarelto x 1 day  5. Practice name and name of physician performing surgery? [facility], physician not listed   6. What is your office phone number [phone]   7.   What is your office fax number [phone]  8.   Anesthesia type (None, local, MAC, general)? Not listed    Joaquim Lai 07/29/2017, 4:29 PM  _________________________________________________________________   (provider comments below)

## 2017-08-01 ENCOUNTER — Encounter: Payer: Self-pay | Admitting: Hematology and Oncology

## 2017-08-01 DIAGNOSIS — R040 Epistaxis: Secondary | ICD-10-CM | POA: Diagnosis not present

## 2017-08-01 NOTE — Assessment & Plan Note (Signed)
She had recent severe epistaxis Her bleeding stopped subsequently after nasal packing She has seen her cardiologist recently for further discussion about the role of chronic anticoagulation therapy I would defer to her cardiologist/ENT for further management

## 2017-08-01 NOTE — Assessment & Plan Note (Addendum)
Her CBC is stable The patient stated she has been compliant taking medications as directed She would take 1000 mg on Mondays, Wednesdays and Fridays and to take 500 mg for the rest of the week I suspect a mild elevated platelet count could be due to component of recent bleeding, slightly improved compared to before I have instructed the patient to update me after her appointment to see ENT If she has no further bleeding, I plan to adjust the dose of hydroxyurea further

## 2017-08-01 NOTE — Progress Notes (Signed)
[place] OFFICE PROGRESS NOTE  Patient Care Team: Gayland Curry, DO as PCP - General (Geriatric Medicine) Larey Dresser, MD as PCP - Cardiology (Cardiology) Community, [facility], Clent Demark, MD as Consulting Physician (Ophthalmology) Larey Dresser, MD as Consulting Physician (Cardiology) Heath Lark, MD as Consulting Physician (Hematology and Oncology) Clent Jacks, MD as Consulting Physician (Ophthalmology) Jerrell Belfast, MD as Consulting Physician (Otolaryngology)  ASSESSMENT & PLAN:  Polycythemia vera Her CBC is stable The patient stated she has been compliant taking medications as directed She would take 1000 mg on Mondays, Wednesdays and Fridays and to take 500 mg for the rest of the week I suspect a mild elevated platelet count could be due to component of recent bleeding, slightly improved compared to before I have instructed the patient to update me after her appointment to see ENT If she has no further bleeding, I plan to adjust the dose of hydroxyurea further  Recurrent epistaxis She had recent severe epistaxis Her bleeding stopped subsequently after nasal packing She has seen her cardiologist recently for further discussion about the role of chronic anticoagulation therapy I would defer to her cardiologist/ENT for further management  Iron deficiency anemia She has history of recurrent iron deficiency anemia Repeat iron studies are adequate  Paroxysmal atrial fibrillation ([place]) Examination revealed occasional PVC but overall in sinus rhythm I would defer to cardiologist for further management   No orders of the defined types were placed in this encounter.   INTERVAL HISTORY: Please see below for problem oriented charting. She is seen urgently due to recurrent epistaxis She had received nasal packing with pending ENT follow-up The patient stated she still have abnormal taste sensation on the regular basis that she  suspects she is to have mild intermittent bleeding She denies dizziness, syncopal episode or shortness of breath She denies no other sites of bleeding She is taking hydroxyurea as directed  SUMMARY OF ONCOLOGIC HISTORY:   Polycythemia vera ([place])   08/05/2011 Pathology Results    Peripheral blood JAK2 mutation was positive with low serum erythropoietin level. Bone marrow aspirate and biopsy was not performed.      08/12/2011 -  Chemotherapy    She is started on hydroxyurea with periodic phlebotomy.       REVIEW OF SYSTEMS:   Constitutional: Denies fevers, chills or abnormal weight loss Eyes: Denies blurriness of vision Ears, nose, mouth, throat, and face: Denies mucositis or sore throat Respiratory: Denies cough, dyspnea or wheezes Cardiovascular: Denies palpitation, chest discomfort or lower extremity swelling Gastrointestinal:  Denies nausea, heartburn or change in bowel habits Skin: Denies abnormal skin rashes Lymphatics: Denies new lymphadenopathy or easy bruising Neurological:Denies numbness, tingling or new weaknesses Behavioral/Psych: Mood is stable, no new changes  All other systems were reviewed with the patient and are negative.  I have reviewed the past medical history, past surgical history, social history and family history with the patient and they are unchanged from previous note.  ALLERGIES:  is allergic to amlodipine.  MEDICATIONS:  Current Outpatient Medications  Medication Sig Dispense Refill  . Cholecalciferol (VITAMIN D) 2000 units CAPS Take 2,000 Units by mouth daily.     [name] Kitchen docusate sodium (COLACE) 50 MG capsule Take 100 mg by mouth 2 (two) times daily.    . fentaNYL (DURAGESIC - DOSED MCG/HR) 25 MCG/HR patch Place 1 patch (25 mcg total) onto the skin every 3 (three) days. 10 patch 0  . furosemide (LASIX) 40 MG tablet Take 2  tablets (80 mg total) by mouth daily. 60 tablet 3  . gabapentin (NEURONTIN) 100 MG capsule Take 100 mg by mouth at bedtime.    .  hydrALAZINE (APRESOLINE) 50 MG tablet TAKE 1 1/2 TABLETS THREE TIMES DAILY. (Patient taking differently: TAKE 75mg  BY MOUTH THREE TIMES DAILY.) 135 tablet 2  . hydroxyurea (HYDREA) 500 MG capsule TAKE 2 CAPSULES ON MON. , WED. AND FRI.[name] Kitchen 1 CAPSULE ALL OTHER DAYS OF THE WEEK. 90 capsule 0  . levothyroxine (SYNTHROID, LEVOTHROID) 100 MCG tablet Take 1 tablet (100 mcg total) by mouth daily before breakfast. 90 tablet 0  . Magnesium 250 MG TABS Take 250 mg by mouth daily.     . Melatonin 10 MG TABS Take 10 mg by mouth at bedtime.    . Multiple Vitamins-Minerals (PRESERVISION AREDS 2) CAPS Take 1 capsule by mouth 2 (two) times daily.     . polycarbophil (FIBERCON) 625 MG tablet Take 2 tablets (1,250 mg total) by mouth 2 (two) times daily. 60 tablet 0  . potassium chloride (K-DUR) 10 MEQ tablet TAKE 1 TABLET BY MOUTH TWICE DAILY. 60 tablet 3  . predniSONE (DELTASONE) 20 MG tablet Take 2 tablets (40 mg total) by mouth daily with breakfast. 10 tablet 0  . sodium chloride (OCEAN) 0.65 % SOLN nasal spray Place 2 sprays into both nostrils as needed for congestion.    . traMADol (ULTRAM) 50 MG tablet TAKE ONE TABLET AT BEDTIME. 30 tablet 0  . XARELTO 15 MG TABS tablet TAKE 1 TABLET ONCE DAILY. (Patient not taking: Reported on 07/27/2017) 30 tablet 6   No current facility-administered medications for this visit.     PHYSICAL EXAMINATION: ECOG PERFORMANCE STATUS: 1 - Symptomatic but completely ambulatory  Vitals:   07/29/17 1323  BP: (!) 152/75  Pulse: 69  Resp: 18  Temp: 97.8 F (36.6 C)  SpO2: 98%   Filed Weights   07/29/17 1323  Weight: 105 lb 14.4 oz (48 kg)    GENERAL:alert, no distress and comfortable SKIN: skin color, texture, turgor are normal, no rashes or significant lesions EYES: normal, Conjunctiva are pink and non-injected, sclera clear OROPHARYNX:no exudate, no erythema and lips, buccal mucosa, and tongue normal.  The nasal passages packed NECK: supple, thyroid normal size,  non-tender, without nodularity LYMPH:  no palpable lymphadenopathy in the cervical, axillary or inguinal LUNGS: clear to auscultation and percussion with normal breathing effort HEART: Noted occasional irregular heartbeat, no murmurs and no lower extremity edema ABDOMEN:abdomen soft, non-tender and normal bowel sounds Musculoskeletal:no cyanosis of digits and no clubbing  NEURO: alert & oriented x 3 with fluent speech, no focal motor/sensory deficits  LABORATORY DATA:  I have reviewed the data as listed    Component Value Date/Time   NA 140 07/26/2017 0954   NA 138 02/17/2017   NA 136 09/14/2016 1428   K 4.2 07/26/2017 0954   K 4.0 09/14/2016 1428   CL 102 07/26/2017 0954   CO2 32 07/26/2017 0954   CO2 30 (H) 09/14/2016 1428   GLUCOSE 98 07/26/2017 0954   GLUCOSE 112 09/14/2016 1428   BUN 37 (H) 07/26/2017 0954   BUN 12 02/17/2017   BUN 14.7 09/14/2016 1428   CREATININE 0.83 07/26/2017 0954   CREATININE 0.8 09/14/2016 1428   CALCIUM 10.9 (H) 07/26/2017 0954   CALCIUM 11.0 (H) 09/14/2016 1428   PROT 5.5 (L) 07/26/2017 0954   PROT 7.0 09/14/2016 1428   ALBUMIN 3.8 07/26/2017 0954   ALBUMIN 4.6 09/14/2016 1428  AST 24 07/26/2017 0954   AST 17 09/14/2016 1428   ALT 30 07/26/2017 0954   ALT 18 09/14/2016 1428   ALKPHOS 47 07/26/2017 0954   ALKPHOS 62 09/14/2016 1428   BILITOT 0.5 07/26/2017 0954   BILITOT 1.00 09/14/2016 1428   GFRNONAA >60 07/26/2017 0954   GFRAA >60 07/26/2017 0954    No results found for: SPEP, UPEP  Lab Results  Component Value Date   WBC 13.2 (H) 07/29/2017   NEUTROABS 11.0 (H) 07/29/2017   HGB 12.4 07/29/2017   HCT 38.2 07/29/2017   MCV 113.4 (H) 07/29/2017   PLT 543 (H) 07/29/2017      Chemistry      Component Value Date/Time   NA 140 07/26/2017 0954   NA 138 02/17/2017   NA 136 09/14/2016 1428   K 4.2 07/26/2017 0954   K 4.0 09/14/2016 1428   CL 102 07/26/2017 0954   CO2 32 07/26/2017 0954   CO2 30 (H) 09/14/2016 1428   BUN 37  (H) 07/26/2017 0954   BUN 12 02/17/2017   BUN 14.7 09/14/2016 1428   CREATININE 0.83 07/26/2017 0954   CREATININE 0.8 09/14/2016 1428   GLU 79 02/17/2017      Component Value Date/Time   CALCIUM 10.9 (H) 07/26/2017 0954   CALCIUM 11.0 (H) 09/14/2016 1428   ALKPHOS 47 07/26/2017 0954   ALKPHOS 62 09/14/2016 1428   AST 24 07/26/2017 0954   AST 17 09/14/2016 1428   ALT 30 07/26/2017 0954   ALT 18 09/14/2016 1428   BILITOT 0.5 07/26/2017 0954   BILITOT 1.00 09/14/2016 1428       RADIOGRAPHIC STUDIES: I have personally reviewed the radiological images as listed and agreed with the findings in the report. Dg Chest 2 View  Result Date: 07/22/2017 CLINICAL DATA:  Right posterior chest pain 1 week. EXAM: CHEST - 2 VIEW COMPARISON:  01/08/2016 and 09/01/2014 FINDINGS: Lungs are adequately inflated with stable mild elevation the right hemidiaphragm. Mild stable linear atelectasis/scarring over the left base. No acute airspace consolidation or effusion. Moderate stable cardiomegaly. Curvature of the spine unchanged. Two adjacent compression fractures in the region of the upper lumbar spine unchanged. IMPRESSION: No acute cardiopulmonary disease. Minimal left basilar scarring. Stable cardiomegaly. Two adjacent stable compression fractures over the lumbar spine. Electronically Signed   By: Marin Olp M.D.   On: 07/22/2017 13:48   Ct Chest Wo Contrast  Result Date: 07/28/2017 CLINICAL DATA:  Chest pain. EXAM: CT CHEST WITHOUT CONTRAST TECHNIQUE: Multidetector CT imaging of the chest was performed following the standard protocol without IV contrast. COMPARISON:  Radiographs of July 22, 2017. CT scan of January 05, 2016. FINDINGS: Cardiovascular: 4.1 cm ascending thoracic aortic aneurysm is noted. Mild cardiomegaly is noted. No pericardial effusion is noted. Mediastinum/Nodes: Thyroid gland and esophagus are unremarkable. Calcified left hilar adenopathy is noted most consistent with granulomatous  disease. Lungs/Pleura: No pneumothorax or pleural effusion is noted. Mild biapical scarring is noted. Mild right basilar subsegmental atelectasis or scarring is noted. Stable scarring is noted in the right upper lobe. No acute abnormality is noted. Upper Abdomen: Calcified splenic granulomata are noted. Left renal cysts are noted. Musculoskeletal: No chest wall mass or suspicious bone lesions identified. IMPRESSION: 4.1 cm ascending thoracic aortic aneurysm. Recommend annual imaging followup by CTA or MRA. This recommendation follows 2010 ACCF/AHA/AATS/ACR/ASA/SCA/SCAI/SIR/STS/SVM Guidelines for the Diagnosis and Management of Patients with Thoracic Aortic Disease. Circulation. 2010; 121: T419-Q222. Calcified left hilar adenopathy is noted most consistent with chronic  granulomatous disease or histoplasmosis. Stable scarring is seen in right upper lobe. No acute pulmonary parenchymal abnormality is noted. Electronically Signed   By: Marijo Conception, M.D.   On: 07/28/2017 16:07   Ct Cervical Spine Wo Contrast  Result Date: 07/28/2017 CLINICAL DATA:  Chronic right shoulder pain. Pain began 1 month ago. Radiculopathy. EXAM: CT CERVICAL SPINE WITHOUT CONTRAST TECHNIQUE: Multidetector CT imaging of the cervical spine was performed without intravenous contrast. Multiplanar CT image reconstructions were also generated. COMPARISON:  CT neck with contrast 10/26/2016 FINDINGS: Alignment: Grade 1 anterolisthesis is present at C7-T1. There is slight anterolisthesis at C2-3. AP alignment is otherwise anatomic. There straightening of the normal cervical lordosis. Skull base and vertebrae: The craniocervical junction demonstrates calcification of soft tissues about the dens. There is no erosion the dens. This calcification does narrow the spinal canal and impacts the upper cervical spinal cord. Soft tissues and spinal canal: Atherosclerotic calcifications are present at the carotid bifurcations bilaterally. No focal mass lesion  present. Previously noted fat containing lesion along the posterior right sternocleidomastoid muscle has been resected. Disc levels: C2-3: Asymmetric left-sided facet hypertrophy is present. There is no significant stenosis. C3-4: Asymmetric right-sided uncovertebral and facet disease is present. Mild right foraminal narrowing is present. Central canal is patent. C4-5: Uncovertebral and facet disease is present bilaterally. This results in moderate bilateral foraminal stenosis. C5-6: Uncovertebral and facet disease is present bilaterally. Moderate bilateral foraminal narrowing is present. Central canal is patent. C6-7: A broad-based disc osteophyte complex present. Uncovertebral spurring results in moderate right and mild left foraminal stenosis. C7-T1: Asymmetric right-sided facet hypertrophy and spurring is present. There is no focal disc protrusion or stenosis. Upper chest: Posterior scarring is present on the left. The lung apices are otherwise clear. Thoracic inlet is within normal limits. IMPRESSION: 1. Uncovertebral spurring and facet hypertrophy result in moderate foraminal stenosis bilaterally at C4-5 and C5-6. 2. Moderate right and mild left foraminal narrowing at C6-7. 3. Mild right foraminal narrowing at C3-4. 4. Asymmetric right-sided facet hypertrophy at C7-T1 without focal stenosis. 5. Atherosclerotic changes at the carotid bifurcations bilaterally. 6. Prominent calcification of soft tissue ligaments about the dens results in moderate central canal stenosis with distortion of the ventral surface of the spinal cord. Electronically Signed   By: San Morelle M.D.   On: 07/28/2017 16:12   [place] Limited Joint Space Structures Up Right  Result Date: 07/30/2017 Procedure: Real-time Ultrasound Guided Injection of right glenohumeral joint Device: GE Logiq Q7 Ultrasound guided injection is preferred based studies that show increased duration, increased effect, greater accuracy, decreased procedural  pain, increased response rate with ultrasound guided versus blind injection. Verbal informed consent obtained. Time-out conducted. Noted no overlying erythema, induration, or other signs of local infection. Skin prepped in a sterile fashion. Local anesthesia: Topical Ethyl chloride. With sterile technique and under real time ultrasound guidance: Joint visualized. 23g 1  inch needle inserted posterior approach. Pictures taken for needle placement. Patient did have injection of 2 cc of 1% lidocaine, 2 cc of 0.5% Marcaine, and 1.0 cc of Kenalog 40 mg/dL. Completed without difficulty Pain immediately resolved suggesting accurate placement of the medication. Advised to call if fevers/chills, erythema, induration, drainage, or persistent bleeding. Images permanently stored and available for review in the ultrasound unit. Impression: Technically successful ultrasound guided injection.    All questions were answered. The patient knows to call the clinic with any problems, questions or concerns. No barriers to learning was detected.  I spent 15 minutes  counseling the patient face to face. The total time spent in the appointment was 20 minutes and more than 50% was on counseling and review of test results  Heath Lark, MD 08/01/2017 8:04 AM

## 2017-08-01 NOTE — Assessment & Plan Note (Signed)
She has history of recurrent iron deficiency anemia Repeat iron studies are adequate

## 2017-08-01 NOTE — Assessment & Plan Note (Signed)
Examination revealed occasional PVC but overall in sinus rhythm I would defer to cardiologist for further management

## 2017-08-02 ENCOUNTER — Encounter: Payer: Self-pay | Admitting: Internal Medicine

## 2017-08-02 ENCOUNTER — Telehealth: Payer: Self-pay | Admitting: *Deleted

## 2017-08-02 ENCOUNTER — Telehealth (HOSPITAL_COMMUNITY): Payer: Self-pay

## 2017-08-02 NOTE — Telephone Encounter (Signed)
[name] from Dr. Elvis Coil office, ENT, called to let me know that she is sending Sarah Mays the progress note from the pts most recent visit and Dr. Elvis Coil recommendations.

## 2017-08-02 NOTE — Telephone Encounter (Addendum)
   Primary Cardiologist: Loralie Champagne, MD  Chart reviewed as part of pre-operative protocol coverage.   Patient is not cleared to stop Xarelto at this time. She had recent TEE with direct current cardioversion on 07/15/17 and should not interrupt anticoagulation for at least 4 weeks following this procedure - this would be through at least 08/12/17 and there appear to be ongoing questions about duration of anticoagulation regardless. She cannot stop this until the EP team weighs in.  Chanetta Marshall saw patient in atrial fib clinic 07/27/17 who noted issues with recurrent epistaxis. She also notes that Dr. Rayann Heman has previously considered stopping anticoagulation for recurrent epistaxis. The patient was going to be seeing ENT, so Amber had also wanted her to call her back after she saw Dr. Wilburn Cornelia with his recommendations so they could make a decision about long-term anticoagulation. She recommended to keep f/u with Dr. Rayann Heman and Dr. Aundra Dubin which are not scheduled until 10/2017.  Callback staff, can you please let patient and requesting surgeon know she is NOT cleared to proceed with epidural steroid injection at this time because she cannot stop the Xarelto for at least 4 weeks after cardioversion? Amber had also wanted the patient to call her back after she saw ENT with his recommendations so they could make a decision about long-term anticoagulation, so please find out what she has learned and forward this information to the EP team. I will remove this message from the APP pre-op pool as further management of her anticoagulation should come from her regular providers, so please route further updates to Amber and Dr. Rayann Heman.  It appears patient and [facility] have also simultaneously contacted advanced HF clinic, so will route to them as FYI as well.   Charlie Pitter, PA-C 08/02/2017, 3:32 PM

## 2017-08-02 NOTE — Telephone Encounter (Signed)
Spoke with pt re: cardiac clearance for her to have epideral injections.  Pt has been made aware that she cannot come off the Xarelto until at least 4 weeks after the Cardioversion, but also that [facility], NP, is still awaiting to hear back from her re: the recommendations on longterm anticoagulation from her ENT Dr. Abbott Pao advised she would contact her ENT and have them send Amber their notes. Pt verbalized understanding as far as the surgical clearance.

## 2017-08-02 NOTE — Telephone Encounter (Signed)
Left a detailed message for Roberta @ [facility], to let her know that pt cannot come off her Xarelto and if she has any questions, to call the office.

## 2017-08-02 NOTE — Telephone Encounter (Signed)
[facility] and patient both calling to follow up on cardiac clearance form faxed to our office last week. Unable to locate form in [place] folders or outgoing mail.  Nothing charted in epic that we received this.  Will forward to Dr. Claris Gladden RN Shirley Muscat to see if she has received this.  Renee Pain, RN

## 2017-08-03 ENCOUNTER — Encounter (HOSPITAL_COMMUNITY): Payer: Self-pay | Admitting: Cardiology

## 2017-08-03 ENCOUNTER — Encounter: Payer: Self-pay | Admitting: Internal Medicine

## 2017-08-03 ENCOUNTER — Telehealth: Payer: Self-pay | Admitting: *Deleted

## 2017-08-03 ENCOUNTER — Encounter: Payer: Self-pay | Admitting: Hematology and Oncology

## 2017-08-03 ENCOUNTER — Non-Acute Institutional Stay: Payer: Medicare Other | Admitting: Internal Medicine

## 2017-08-03 VITALS — BP 120/70 | HR 92 | Temp 98.2°F | Ht 60.0 in | Wt 104.0 lb

## 2017-08-03 DIAGNOSIS — R040 Epistaxis: Secondary | ICD-10-CM

## 2017-08-03 DIAGNOSIS — I480 Paroxysmal atrial fibrillation: Secondary | ICD-10-CM

## 2017-08-03 DIAGNOSIS — H9113 Presbycusis, bilateral: Secondary | ICD-10-CM | POA: Insufficient documentation

## 2017-08-03 DIAGNOSIS — M810 Age-related osteoporosis without current pathological fracture: Secondary | ICD-10-CM

## 2017-08-03 DIAGNOSIS — D45 Polycythemia vera: Secondary | ICD-10-CM

## 2017-08-03 DIAGNOSIS — Z7901 Long term (current) use of anticoagulants: Secondary | ICD-10-CM

## 2017-08-03 DIAGNOSIS — E039 Hypothyroidism, unspecified: Secondary | ICD-10-CM

## 2017-08-03 DIAGNOSIS — I5032 Chronic diastolic (congestive) heart failure: Secondary | ICD-10-CM

## 2017-08-03 MED ORDER — DENOSUMAB 60 MG/ML ~~LOC~~ SOSY
60.0000 mg | PREFILLED_SYRINGE | Freq: Once | SUBCUTANEOUS | Status: AC
  Administered 2017-08-03: 60 mg via SUBCUTANEOUS

## 2017-08-03 NOTE — Telephone Encounter (Signed)
-----   Message from Heath Lark, MD sent at 08/03/2017  8:00 AM EDT ----- Regarding: call daughter for update Can you call daughter see how she is doing? Any further bleeding?

## 2017-08-03 NOTE — Progress Notes (Signed)
Location:   [facility]   Place of Service:   clinic  Provider: Anniemae Haberkorn L. Mariea Clonts, D.O., C.M.D.  Code Status: DNR Goals of Care:  Advanced Directives 07/15/2017  Does Sarah Mays Have a Medical Advance Directive? Yes  Type of Paramedic of [place];Living will  Does Sarah Mays want to make changes to medical advance directive? -  Copy of [place] in Chart? No - copy requested  Would Sarah Mays like information on creating a medical advance directive? -  Pre-existing out of facility DNR order (yellow form or pink MOST form) -     Chief Complaint  Sarah Mays presents with  . Medical Management of Chronic Issues    29mth follow-up    HPI: Sarah Mays is a 84 y.o. female seen today for medical management of chronic diseases.  Says she feels ok.   Cannot get epidural b/c of her xarelto for her afib s/p cardioversion 07/15/17 She had gone for her injections in her back and neck with Dr. Jacelyn Grip, but felt awful after her second injection.  She was dizzy and weak.  She considered coming to rehab but felt better later and stayed.  Is tired of her bloody noses.  Last major nosebleed was a week ago.  Packing came out yesterday. No bleeding so far since.   Hydrea was adjusted by Dr. Alvy Bimler today--pt already took her usual dose so will adjust for Friday.   Weight down just a few lbs--fluctuates.   No shortness of breath or edema. She's very constipated with the pain medication.  Did have BM this morning after milk of mag last night.  Usually just takes the fibercon and colace.  Trying to drink.   Had to sleep in recliner with the packing for her nose, but did rest ok.    Past Medical History:  Diagnosis Date  . Abnormal stress test    a. 11/2011 Ex MV: EF 80%, small, partially reversible anteroapical defect->mild ischemia vs attenuation-->med Rx.  [name] Kitchen Alopecia, unspecified   . Arthritis   . Chronic diastolic CHF (congestive heart failure) ([place])    a. 11/2013 Echo: EF  60-65%, no rwma, mild TR, PASP 21mmHg.  . Closed fracture of lower end of radius with ulna   . Deafness    Left, s/p multiple surgeries  . Essential hypertension   . Gouty arthropathy, unspecified   . History of kidney stones   . Hypothyroidism   . Insomnia, unspecified   . Macular degeneration    Left, s/p inj. tx  . Mitral valve disorders(424.0)   . Neck mass    right, w/u including MRI negative  . PAF (paroxysmal atrial fibrillation) (HCC)    a. on amio (02/2012 mild obstruction on PFT's) and xarelto.  . Polycythemia vera(238.4)   . Pure hypercholesterolemia   . Senile osteoporosis    Reclast in the past  . Tricuspid valve disorders, specified as nonrheumatic     Past Surgical History:  Procedure Laterality Date  . BREAST BIOPSY  06/18/1998   left  . CARDIOVERSION N/A 07/15/2017   Procedure: CARDIOVERSION;  Surgeon: Larey Dresser, MD;  Location: [facility] ENDOSCOPY;  Service: Cardiovascular;  Laterality: N/A;  . ELECTROPHYSIOLOGIC STUDY N/A 01/08/2016   Procedure: Atrial Fibrillation Ablation;  Surgeon: Thompson Grayer, MD;  Location: [facility] CV LAB;  Service: Cardiovascular;  Laterality: N/A;  . FEMUR IM NAIL Right 10/08/2015   Procedure: INTRAMEDULLARY (IM) NAIL FEMORAL RIGHT;  Surgeon: Paralee Cancel, MD;  Location: [facility] ORS;  Service:  Orthopedics;  Laterality: Right;  . MASS EXCISION Right 11/26/2016   Procedure: EXCISION RIGHT LATERAL NECK MASS;  Surgeon: Jerrell Belfast, MD;  Location: [facility];  Service: ENT;  Laterality: Right;  . TEE WITHOUT CARDIOVERSION N/A 07/15/2017   Procedure: TRANSESOPHAGEAL ECHOCARDIOGRAM (TEE);  Surgeon: Larey Dresser, MD;  Location[facility] ENDOSCOPY;  Service: Cardiovascular;  Laterality: N/A;  . TONSILLECTOMY    . TOTAL ABDOMINAL HYSTERECTOMY    . TYMPANOPLASTY Bilateral     Allergies  Allergen Reactions  . Amlodipine Swelling and Other (See Comments)    Reaction:  Lower extremity swelling     Outpatient Encounter Medications as of 08/03/2017    Medication Sig  . Cholecalciferol (VITAMIN D) 2000 units CAPS Take 2,000 Units by mouth daily.   [name] Kitchen docusate sodium (COLACE) 50 MG capsule Take 100 mg by mouth 2 (two) times daily.  . fentaNYL (DURAGESIC - DOSED MCG/HR) 25 MCG/HR patch Place 1 patch (25 mcg total) onto the skin every 3 (three) days.  . furosemide (LASIX) 40 MG tablet Take 2 tablets (80 mg total) by mouth daily.  [name] Kitchen gabapentin (NEURONTIN) 100 MG capsule Take 100 mg by mouth at bedtime.  . hydrALAZINE (APRESOLINE) 50 MG tablet TAKE 1 1/2 TABLETS THREE TIMES DAILY.  . hydroxyurea (HYDREA) 500 MG capsule Take 500 mg on Monday Wednesday and Friday and take 1000 mg all the other days. May take with food to minimize GI side effects.  [name] Kitchen levothyroxine (SYNTHROID, LEVOTHROID) 100 MCG tablet Take 1 tablet (100 mcg total) by mouth daily before breakfast.  . Magnesium 250 MG TABS Take 250 mg by mouth daily.   . Melatonin 10 MG TABS Take 10 mg by mouth at bedtime.  . Multiple Vitamins-Minerals (PRESERVISION AREDS 2) CAPS Take 1 capsule by mouth 2 (two) times daily.   . polycarbophil (FIBERCON) 625 MG tablet Take 2 tablets (1,250 mg total) by mouth 2 (two) times daily.  . potassium chloride (K-DUR) 10 MEQ tablet TAKE 1 TABLET BY MOUTH TWICE DAILY.  . sodium chloride (OCEAN) 0.65 % SOLN nasal spray Place 2 sprays into both nostrils as needed for congestion.  . traMADol (ULTRAM) 50 MG tablet TAKE ONE TABLET AT BEDTIME.  [name] Kitchen XARELTO 15 MG TABS tablet TAKE 1 TABLET ONCE DAILY.  . [DISCONTINUED] hydroxyurea (HYDREA) 500 MG capsule TAKE 2 CAPSULES ON MON. , WED. AND FRI.[name] Kitchen 1 CAPSULE ALL OTHER DAYS OF THE WEEK.  . [DISCONTINUED] predniSONE (DELTASONE) 20 MG tablet Take 2 tablets (40 mg total) by mouth daily with breakfast.  . [EXPIRED] denosumab (PROLIA) injection 60 mg    No facility-administered encounter medications on file as of 08/03/2017.     Review of Systems:  Review of Systems  Constitutional: Positive for malaise/fatigue. Negative for  chills and fever.  HENT: Positive for hearing loss and nosebleeds. Negative for congestion.   Eyes: Negative for blurred vision.  Respiratory: Negative for cough and shortness of breath.   Cardiovascular: Negative for chest pain, palpitations and leg swelling.  Gastrointestinal: Negative for abdominal pain, blood in stool, constipation and melena.  Genitourinary: Negative for dysuria.  Musculoskeletal: Positive for back pain and myalgias. Negative for falls and joint pain.  Skin: Negative for itching and rash.  Neurological: Negative for dizziness.  Endo/Heme/Allergies: Bruises/bleeds easily.  Psychiatric/Behavioral: Negative for depression and memory loss. The Sarah Mays is nervous/anxious. The Sarah Mays does not have insomnia.     Health Maintenance  Topic Date Due  . INFLUENZA VACCINE  09/08/2017  . TETANUS/TDAP  01/21/2027  .  DEXA SCAN  Completed  . PNA vac Low Risk Adult  Completed    Physical Exam: Vitals:   08/03/17 1353  BP: 120/70  Pulse: 92  Temp: 98.2 F (36.8 C)  TempSrc: Oral  SpO2: 95%  Weight: 104 lb (47.2 kg)  Height: 5' (1.524 m)   Body mass index is 20.31 kg/m. Physical Exam  Constitutional: She is oriented to person, place, and time. She appears well-developed. No distress.  HENT:  Hearing loss worse  Cardiovascular: Normal rate, regular rhythm, normal heart sounds and intact distal pulses.  Pulmonary/Chest: Effort normal and breath sounds normal. No respiratory distress.  Abdominal: Bowel sounds are normal.  Musculoskeletal: Normal range of motion.  Neurological: She is alert and oriented to person, place, and time.  Skin: Skin is warm and dry.  Psychiatric:  Anxious and jittery today    Labs reviewed: Basic Metabolic Panel: Recent Labs    12/27/16 1427 02/07/17 2305  07/07/17 1528 07/26/17 0954 07/28/17 0800  NA  --  133*   < > 138 140 141  K  --  3.8   < > 3.8 4.2 5.0  CL  --  97*  --  102 102  --   CO2  --  27  --  30 32  --   GLUCOSE   --  117*  --  88 98  --   BUN  --  26*   < > 16 37* 38*  CREATININE  --  0.80   < > 0.82 0.83 0.7  CALCIUM  --  10.7*  --  10.7* 10.9*  --   TSH 0.194*  --   --   --   --  1.37   < > = values in this interval not displayed.   Liver Function Tests: Recent Labs    09/14/16 1428 02/07/17 2305 07/26/17 0954 07/28/17 0800  AST 17 21 24 23   ALT 18 16 30 30   ALKPHOS 62 59 47 62  BILITOT 1.00 0.9 0.5  --   PROT 7.0 6.6 5.5*  --   ALBUMIN 4.6 4.7 3.8  --    No results for input(s): LIPASE, AMYLASE in the last 8760 hours. No results for input(s): AMMONIA in the last 8760 hours. CBC: Recent Labs    05/10/17 1406 07/07/17 1528 07/26/17 0954 07/28/17 0800 07/29/17 1305  WBC 8.1 8.1 11.8* 15.3 13.2*  NEUTROABS 6.4  --  10.0*  --  11.0*  HGB 13.5 11.6* 11.6* 13.4 12.4  HCT 42.2 36.3 36.4 41 38.2  MCV 107.3* 112.0* 112.3*  --  113.4*  PLT 454* 535* 523* 595* 543*   Lipid Panel: Recent Labs    07/28/17 0800  CHOL 194  HDL 72*  LDLCALC 102  TRIG 99   No results found for: HGBA1C  Procedures since last visit: Dg Chest 2 View  Result Date: 07/22/2017 CLINICAL DATA:  Right posterior chest pain 1 week. EXAM: CHEST - 2 VIEW COMPARISON:  01/08/2016 and 09/01/2014 FINDINGS: Lungs are adequately inflated with stable mild elevation the right hemidiaphragm. Mild stable linear atelectasis/scarring over the left base. No acute airspace consolidation or effusion. Moderate stable cardiomegaly. Curvature of the spine unchanged. Two adjacent compression fractures in the region of the upper lumbar spine unchanged. IMPRESSION: No acute cardiopulmonary disease. Minimal left basilar scarring. Stable cardiomegaly. Two adjacent stable compression fractures over the lumbar spine. Electronically Signed   By: Marin Olp M.D.   On: 07/22/2017 13:48   Ct Chest Wo  Contrast  Result Date: 07/28/2017 CLINICAL DATA:  Chest pain. EXAM: CT CHEST WITHOUT CONTRAST TECHNIQUE: Multidetector CT imaging of the  chest was performed following the standard protocol without IV contrast. COMPARISON:  Radiographs of July 22, 2017. CT scan of January 05, 2016. FINDINGS: Cardiovascular: 4.1 cm ascending thoracic aortic aneurysm is noted. Mild cardiomegaly is noted. No pericardial effusion is noted. Mediastinum/Nodes: Thyroid gland and esophagus are unremarkable. Calcified left hilar adenopathy is noted most consistent with granulomatous disease. Lungs/Pleura: No pneumothorax or pleural effusion is noted. Mild biapical scarring is noted. Mild right basilar subsegmental atelectasis or scarring is noted. Stable scarring is noted in the right upper lobe. No acute abnormality is noted. Upper Abdomen: Calcified splenic granulomata are noted. Left renal cysts are noted. Musculoskeletal: No chest wall mass or suspicious bone lesions identified. IMPRESSION: 4.1 cm ascending thoracic aortic aneurysm. Recommend annual imaging followup by CTA or MRA. This recommendation follows 2010 ACCF/AHA/AATS/ACR/ASA/SCA/SCAI/SIR/STS/SVM Guidelines for the Diagnosis and Management of Patients with Thoracic Aortic Disease. Circulation. 2010; 121: P824-M353. Calcified left hilar adenopathy is noted most consistent with chronic granulomatous disease or histoplasmosis. Stable scarring is seen in right upper lobe. No acute pulmonary parenchymal abnormality is noted. Electronically Signed   By: Marijo Conception, M.D.   On: 07/28/2017 16:07   Ct Cervical Spine Wo Contrast  Result Date: 07/28/2017 CLINICAL DATA:  Chronic right shoulder pain. Pain began 1 month ago. Radiculopathy. EXAM: CT CERVICAL SPINE WITHOUT CONTRAST TECHNIQUE: Multidetector CT imaging of the cervical spine was performed without intravenous contrast. Multiplanar CT image reconstructions were also generated. COMPARISON:  CT neck with contrast 10/26/2016 FINDINGS: Alignment: Grade 1 anterolisthesis is present at C7-T1. There is slight anterolisthesis at C2-3. AP alignment is otherwise  anatomic. There straightening of the normal cervical lordosis. Skull base and vertebrae: The craniocervical junction demonstrates calcification of soft tissues about the dens. There is no erosion the dens. This calcification does narrow the spinal canal and impacts the upper cervical spinal cord. Soft tissues and spinal canal: Atherosclerotic calcifications are present at the carotid bifurcations bilaterally. No focal mass lesion present. Previously noted fat containing lesion along the posterior right sternocleidomastoid muscle has been resected. Disc levels: C2-3: Asymmetric left-sided facet hypertrophy is present. There is no significant stenosis. C3-4: Asymmetric right-sided uncovertebral and facet disease is present. Mild right foraminal narrowing is present. Central canal is patent. C4-5: Uncovertebral and facet disease is present bilaterally. This results in moderate bilateral foraminal stenosis. C5-6: Uncovertebral and facet disease is present bilaterally. Moderate bilateral foraminal narrowing is present. Central canal is patent. C6-7: A broad-based disc osteophyte complex present. Uncovertebral spurring results in moderate right and mild left foraminal stenosis. C7-T1: Asymmetric right-sided facet hypertrophy and spurring is present. There is no focal disc protrusion or stenosis. Upper chest: Posterior scarring is present on the left. The lung apices are otherwise clear. Thoracic inlet is within normal limits. IMPRESSION: 1. Uncovertebral spurring and facet hypertrophy result in moderate foraminal stenosis bilaterally at C4-5 and C5-6. 2. Moderate right and mild left foraminal narrowing at C6-7. 3. Mild right foraminal narrowing at C3-4. 4. Asymmetric right-sided facet hypertrophy at C7-T1 without focal stenosis. 5. Atherosclerotic changes at the carotid bifurcations bilaterally. 6. Prominent calcification of soft tissue ligaments about the dens results in moderate central canal stenosis with distortion of  the ventral surface of the spinal cord. Electronically Signed   By: San Morelle M.D.   On: 07/28/2017 16:12   [place] Limited Joint Space Structures Up Right  Result Date:  07/30/2017 Procedure: Real-time Ultrasound Guided Injection of right glenohumeral joint Device: GE Logiq Q7 Ultrasound guided injection is preferred based studies that show increased duration, increased effect, greater accuracy, decreased procedural pain, increased response rate with ultrasound guided versus blind injection. Verbal informed consent obtained. Time-out conducted. Noted no overlying erythema, induration, or other signs of local infection. Skin prepped in a sterile fashion. Local anesthesia: Topical Ethyl chloride. With sterile technique and under real time ultrasound guidance: Joint visualized. 23g 1  inch needle inserted posterior approach. Pictures taken for needle placement. Sarah Mays did have injection of 2 cc of 1% lidocaine, 2 cc of 0.5% Marcaine, and 1.0 cc of Kenalog 40 mg/dL. Completed without difficulty Pain immediately resolved suggesting accurate placement of the medication. Advised to call if fevers/chills, erythema, induration, drainage, or persistent bleeding. Images permanently stored and available for review in the ultrasound unit. Impression: Technically successful ultrasound guided injection.    Assessment/Plan 1. Senile osteoporosis -cont vitamin D therapy, weightbearing exercise - denosumab (PROLIA) injection 60 mg  2. Chronic diastolic CHF (congestive heart failure) (HCC) -controlled with lasix 80mg  po daily and potassium supplement  3. Recurrent epistaxis -frustrated with this  -Dr. Wilburn Cornelia following from ENT and did the last packing for her which was removed yesterday--so far so good--he is concerned xarelto is responsible, but pt just had her cardioversion this month so too early to stop it and also too early to stop it to get her epidural done that she needs badly for back pain  4.  Polycythemia vera ([place]) -blood counts remain high -Dr. Alvy Bimler adjusted her hydrea dose as of today to 500mg  mon/wed/fri and 1000mg  all other days  5. Paroxysmal atrial fibrillation (HCC) -cont xarelto at this point, is back in sinus s/p cardioversion 07/15/17  6. Chronic anticoagulation -ongoing xarelto therapy for stroke prevention  7. Hypothyroidism, unspecified type -cont current levothyroxine,  Lab Results  Component Value Date   TSH 1.37 07/28/2017  wnl  8. Presbycusis of both ears -needs hearing test and hearing aids, but has not had time to do this due to her many other recent appts and problems  Labs/tests ordered:  No new Next appt:  11/30/2017  Mekhi Lascola L. Nike Southers, D.O. [facility] [address], [place] [address] Cell Phone (Mon-Fri 8am-5pm):  [phone] On Call:  [phone] & follow prompts after 5pm & weekends Office Phone:  [phone] Office Fax:  [phone]

## 2017-08-04 ENCOUNTER — Ambulatory Visit: Payer: Medicare Other | Admitting: Family Medicine

## 2017-08-04 ENCOUNTER — Telehealth (HOSPITAL_COMMUNITY): Payer: Self-pay | Admitting: *Deleted

## 2017-08-04 ENCOUNTER — Telehealth: Payer: Self-pay | Admitting: Hematology and Oncology

## 2017-08-04 NOTE — Telephone Encounter (Signed)
Per [employer] below.  Patient cannot stop Xarelto until after 7/7 and then can hold for 2 days prior to procedure.  Spoke with [facility] and they're aware.  I have faxed copy of message below to their office at [phone].     Message   She cannot hold Xarelto until after 7/7 (1 month post DCCV). At that point, can hold Xarelto x 2 days prior to epidural, restart afterwards.     ----- Message -----  From: Darron Doom, RN  Sent: 08/03/2017 10:29 AM  To: Larey Dresser, MD  Subject: FW: Visit Follow-Up Question             Please see below and let me know so that I can call [facility]. Thanks  ----- Message -----  From: Dewaine Conger Medina  Sent: 08/03/2017  9:50 AM  To: [facility] Clinical Pool  Subject: Visit Follow-Up Question               ----- Message from Mychart, Generic sent at 08/03/2017 9:50 AM EDT -----    This message is being sent by Ofilia Neas on behalf of [employer]    [facility] is trying to schedule an epidural for mom's neck which has been very painful for the last several weeks. They need permission from your office for her to go off her Alen Blew prior to the epidural. They won't schedule it until they hear directly from your office. Thanks!

## 2017-08-04 NOTE — Telephone Encounter (Signed)
Mailed patient calendar of upcoming July appointments per 6/27 sch message

## 2017-08-08 ENCOUNTER — Emergency Department (HOSPITAL_COMMUNITY): Payer: Medicare Other

## 2017-08-08 ENCOUNTER — Telehealth: Payer: Self-pay | Admitting: *Deleted

## 2017-08-08 ENCOUNTER — Telehealth: Payer: Self-pay

## 2017-08-08 ENCOUNTER — Emergency Department (HOSPITAL_COMMUNITY)
Admission: EM | Admit: 2017-08-08 | Discharge: 2017-08-08 | Disposition: A | Discharge status: Home / Self Care | Payer: Medicare Other | Attending: Emergency Medicine | Admitting: Emergency Medicine

## 2017-08-08 ENCOUNTER — Other Ambulatory Visit: Payer: Self-pay | Admitting: Hematology and Oncology

## 2017-08-08 DIAGNOSIS — M25552 Pain in left hip: Secondary | ICD-10-CM | POA: Diagnosis not present

## 2017-08-08 DIAGNOSIS — W19XXXA Unspecified fall, initial encounter: Secondary | ICD-10-CM | POA: Diagnosis not present

## 2017-08-08 DIAGNOSIS — R51 Headache: Secondary | ICD-10-CM | POA: Insufficient documentation

## 2017-08-08 DIAGNOSIS — I440 Atrioventricular block, first degree: Secondary | ICD-10-CM | POA: Diagnosis not present

## 2017-08-08 DIAGNOSIS — I959 Hypotension, unspecified: Secondary | ICD-10-CM | POA: Diagnosis not present

## 2017-08-08 DIAGNOSIS — I5032 Chronic diastolic (congestive) heart failure: Secondary | ICD-10-CM | POA: Insufficient documentation

## 2017-08-08 DIAGNOSIS — M542 Cervicalgia: Secondary | ICD-10-CM | POA: Diagnosis not present

## 2017-08-08 DIAGNOSIS — Z7901 Long term (current) use of anticoagulants: Secondary | ICD-10-CM | POA: Insufficient documentation

## 2017-08-08 DIAGNOSIS — Z79899 Other long term (current) drug therapy: Secondary | ICD-10-CM | POA: Insufficient documentation

## 2017-08-08 DIAGNOSIS — Z87891 Personal history of nicotine dependence: Secondary | ICD-10-CM | POA: Insufficient documentation

## 2017-08-08 DIAGNOSIS — I2510 Atherosclerotic heart disease of native coronary artery without angina pectoris: Secondary | ICD-10-CM | POA: Insufficient documentation

## 2017-08-08 DIAGNOSIS — I110 Hypertensive heart disease with heart failure: Secondary | ICD-10-CM | POA: Insufficient documentation

## 2017-08-08 DIAGNOSIS — S79912A Unspecified injury of left hip, initial encounter: Secondary | ICD-10-CM | POA: Diagnosis not present

## 2017-08-08 DIAGNOSIS — E039 Hypothyroidism, unspecified: Secondary | ICD-10-CM | POA: Diagnosis not present

## 2017-08-08 DIAGNOSIS — I4430 Unspecified atrioventricular block: Secondary | ICD-10-CM | POA: Diagnosis not present

## 2017-08-08 DIAGNOSIS — S0990XA Unspecified injury of head, initial encounter: Secondary | ICD-10-CM | POA: Diagnosis not present

## 2017-08-08 MED ORDER — ACETAMINOPHEN 325 MG PO TABS
325.0000 mg | ORAL_TABLET | Freq: Once | ORAL | Status: AC
  Administered 2017-08-08: 325 mg via ORAL
  Filled 2017-08-08: qty 1

## 2017-08-08 NOTE — Discharge Instructions (Addendum)
Take home medications as prescribed.  Follow up with your primary care doctor for reevaluation in 3 days or as needed.   Return to the ER with any new or concerning symptoms.

## 2017-08-08 NOTE — Telephone Encounter (Signed)
Noted.  I'm sorry to hear this and I hope she's ok.

## 2017-08-08 NOTE — ED Notes (Signed)
Pt went to x-ray.

## 2017-08-08 NOTE — ED Provider Notes (Signed)
[facility] DEPART[facility] Provider Note   CSN: [id number] Arrival date & time: 08/08/17  1337     History   Chief Complaint Chief Complaint  Patient presents with  . Fall    HPI [name] is a 84 y.o. female presenting for evaluation after a fall.   Pt states she tripped over the rug, landing on her L hip and hitting her head. She denies LOC, she is on xarelto. Last took last evening. This happened just prior to arrival.  She reports pain in the left hip, no pain of her head.  She has been having some neck pain, is unsure if it is worse at this time.  She has not ambulated since.  She denies numbness or tingling.  She denies vision changes, slurred speech, chest pain, shortness breath, nausea, vomiting, abdominal pain, loss of bowel bladder control. She denies back pain, upper extremity pain, or R leg pain.   HPI  Past Medical History:  Diagnosis Date  . Abnormal stress test    a. 11/2011 Ex MV: EF 80%, small, partially reversible anteroapical defect->mild ischemia vs attenuation-->med Rx.  [name] Kitchen Alopecia, unspecified   . Arthritis   . Chronic diastolic CHF (congestive heart failure) ([place])    a. 11/2013 Echo: EF 60-65%, no rwma, mild TR, PASP 56mmHg.  . Closed fracture of lower end of radius with ulna   . Deafness    Left, s/p multiple surgeries  . Essential hypertension   . Gouty arthropathy, unspecified   . History of kidney stones   . Hypothyroidism   . Insomnia, unspecified   . Macular degeneration    Left, s/p inj. tx  . Mitral valve disorders(424.0)   . Neck mass    right, w/u including MRI negative  . PAF (paroxysmal atrial fibrillation) (HCC)    a. on amio (02/2012 mild obstruction on PFT's) and xarelto.  . Polycythemia vera(238.4)   . Pure hypercholesterolemia   . Senile osteoporosis    Reclast in the past  . Tricuspid valve disorders, specified as nonrheumatic     Patient Active Problem List   Diagnosis Date Noted  .  Presbycusis of both ears 08/03/2017  . Deficiency anemia 07/26/2017  . Recurrent epistaxis 02/25/2017  . Iron deficiency anemia 02/21/2017  . Pure hypercholesterolemia   . PONV (postoperative nausea and vomiting)   . PAF (paroxysmal atrial fibrillation) ([place])   . Neck mass   . Insomnia, unspecified   . History of kidney stones   . Deafness   . Closed fracture of lower end of radius with ulna   . Arthritis   . Abnormal stress test   . Rotator cuff arthropathy, right 09/08/2016  . Trigger point of right shoulder region 05/05/2016  . Spondylosis of cervical region without myelopathy or radiculopathy 05/05/2016  . Chondrocalcinosis 05/05/2016  . Chronic pain disorder 05/05/2016  . A-fib ([place]) 01/08/2016  . Closed fracture of shaft of right femur, initial encounter ([place]) 10/25/2015  . Status post-operative repair of hip fracture 10/25/2015  . Greater trochanteric bursitis of right hip 09/29/2015  . Asymptomatic cholelithiasis 05/09/2015  . Essential hypertension 11/07/2014  . Hair loss 11/07/2014  . Bilateral leg edema 11/07/2014  . Constipation due to pain medication 11/06/2014  . Hypotension due to drugs 11/06/2014  . Chest pain 09/01/2014  . Osteoporosis 01/04/2014  . Gout 01/04/2014  . Headache 01/04/2014  . Hypothyroidism 11/19/2013  . Hypercalcemia 10/11/2013  . Insomnia 05/16/2013  . Lumbar back pain 05/16/2013  .  Polycythemia vera ([place])   . Mass of right side of neck   . Senile osteoporosis   . Macular degeneration   . Sick sinus syndrome ([place]) 03/09/2012  . CAD (coronary artery disease) 12/27/2011  . Paroxysmal atrial fibrillation ([place]) 12/27/2011  . Chronic diastolic CHF (congestive heart failure) ([place]) 12/27/2011  . HTN (hypertension) 12/27/2011    Past Surgical History:  Procedure Laterality Date  . BREAST BIOPSY  06/18/1998   left  . CARDIOVERSION N/A 07/15/2017   Procedure: CARDIOVERSION;  Surgeon: Larey Dresser, MD;  Location: [facility] ENDOSCOPY;  Service:  Cardiovascular;  Laterality: N/A;  . ELECTROPHYSIOLOGIC STUDY N/A 01/08/2016   Procedure: Atrial Fibrillation Ablation;  Surgeon: [place] Grayer, MD;  Location: [facility] CV LAB;  Service: Cardiovascular;  Laterality: N/A;  . FEMUR IM NAIL Right 10/08/2015   Procedure: INTRAMEDULLARY (IM) NAIL FEMORAL RIGHT;  Surgeon: Paralee Cancel, MD;  Location: [facility] ORS;  Service: Orthopedics;  Laterality: Right;  . MASS EXCISION Right 11/26/2016   Procedure: EXCISION RIGHT LATERAL NECK MASS;  Surgeon: Jerrell Belfast, MD;  Location: [facility];  Service: ENT;  Laterality: Right;  . TEE WITHOUT CARDIOVERSION N/A 07/15/2017   Procedure: TRANSESOPHAGEAL ECHOCARDIOGRAM (TEE);  Surgeon: Larey Dresser, MD;  Location: [facility] ENDOSCOPY;  Service: Cardiovascular;  Laterality: N/A;  . TONSILLECTOMY    . TOTAL ABDOMINAL HYSTERECTOMY    . TYMPANOPLASTY Bilateral      OB History   None      Home Medications    Prior to Admission medications   Medication Sig Start Date End Date Taking? Authorizing Provider  Cholecalciferol (VITAMIN D) 2000 units CAPS Take 2,000 Units by mouth daily.     [provider]  docusate sodium (COLACE) 50 MG capsule Take 100 mg by mouth 2 (two) times daily.    [provider]  fentaNYL (DURAGESIC - DOSED MCG/HR) 25 MCG/HR patch Place 1 patch (25 mcg total) onto the skin every 3 (three) days. 07/11/17   Reed, Tiffany L, DO  furosemide (LASIX) 40 MG tablet Take 2 tablets (80 mg total) by mouth daily. 05/02/17   Larey Dresser, MD  gabapentin (NEURONTIN) 100 MG capsule Take 100 mg by mouth at bedtime.    [provider]  hydrALAZINE (APRESOLINE) 50 MG tablet TAKE 1 1/2 TABLETS THREE TIMES DAILY. 07/25/17   Larey Dresser, MD  hydroxyurea (HYDREA) 500 MG capsule 500 mg on Mondays, Wednesdays and Fridays and 1000 mg the other days of the week by mouth 08/08/17   Heath Lark, MD  levothyroxine (SYNTHROID, LEVOTHROID) 100 MCG tablet Take 1 tablet (100 mcg total) by mouth daily  before breakfast. 06/30/17   Reed, Tiffany L, DO  Magnesium 250 MG TABS Take 250 mg by mouth daily.     [provider]  Melatonin 10 MG TABS Take 10 mg by mouth at bedtime.    [provider]  Multiple Vitamins-Minerals (PRESERVISION AREDS 2) CAPS Take 1 capsule by mouth 2 (two) times daily.     [provider]  polycarbophil (FIBERCON) 625 MG tablet Take 2 tablets (1,250 mg total) by mouth 2 (two) times daily. 10/10/15   Arrien, Jimmy Picket, MD  potassium chloride (K-DUR) 10 MEQ tablet TAKE 1 TABLET BY MOUTH TWICE DAILY. 07/18/17   Reed, Tiffany L, DO  sodium chloride (OCEAN) 0.65 % SOLN nasal spray Place 2 sprays into both nostrils as needed for congestion.    [provider]  traMADol (ULTRAM) 50 MG tablet TAKE ONE TABLET AT  BEDTIME. 07/22/17   Reed, Tiffany L, DO  XARELTO 15 MG TABS tablet TAKE 1 TABLET ONCE DAILY. 07/19/17   Larey Dresser, MD    Family History Family History  Problem Relation Age of Onset  . Hypertension Father   . Heart disease Father        patient does not know details.   . Ovarian cancer Mother 11  . Cancer Sister        colon  . Heart disease Sister   . Cancer Sister   . Cancer Sister        hodgkin disease  . Breast cancer Daughter   . Cancer Daughter        breast    Social History Social History   Tobacco Use  . Smoking status: Former Smoker    Types: Cigarettes    Last attempt to quit: 01/19/1984    Years since quitting: 33.5  . Smokeless tobacco: Never Used  Substance Use Topics  . Alcohol use: Yes    Alcohol/week: 0.6 oz    Types: 1 Glasses of wine per week    Comment: 2 per day/ 14 a week  . Drug use: No     Allergies   Amlodipine   Review of Systems Review of Systems  Musculoskeletal: Positive for arthralgias.  Hematological: Bruises/bleeds easily.  All other systems reviewed and are negative.    Physical Exam Updated Vital Signs BP (!) 160/95 (BP Location: Right Arm)   Pulse (!) 102    Resp 18   SpO2 95%   Physical Exam  Constitutional: She is oriented to person, place, and time. She appears well-developed and well-nourished. No distress.  Elderly female in no distress  HENT:  Head: Normocephalic and atraumatic.  Mouth/Throat: Uvula is midline, oropharynx is clear and moist and mucous membranes are normal.  No TTP of the head. No hematoma or contusion,   Eyes: Pupils are equal, round, and reactive to light. Conjunctivae and EOM are normal.  EIOMI and PERRLA  Neck: Normal range of motion. Neck supple.  Cardiovascular: Normal rate, regular rhythm and intact distal pulses.  Pulmonary/Chest: Effort normal and breath sounds normal. No respiratory distress. She has no wheezes.  Abdominal: Soft. She exhibits no distension and no mass. There is no tenderness. There is no guarding.  Musculoskeletal: Normal range of motion.  Mild tenderness palpation of lateral left hip.  No obvious deformity.  No shortening or rotation of the leg.  Strength intact x4.  Sensation intact x4.  Radial pedal pulses intact bilaterally.  Neurological: She is alert and oriented to person, place, and time.  No obvious neurologic deficit.  Skin: Skin is warm and dry.  Psychiatric: She has a normal mood and affect.  Nursing note and vitals reviewed.    ED Treatments / Results  Labs (all labs ordered are listed, but only abnormal results are displayed) Labs Reviewed - No data to display  EKG None  Radiology Ct Head Wo Contrast  Result Date: 08/08/2017 CLINICAL DATA:  Mechanical fall. Hit left side of head. Complains of right-sided neck pain. On anticoagulation. EXAM: CT HEAD WITHOUT CONTRAST CT CERVICAL SPINE WITHOUT CONTRAST TECHNIQUE: Multidetector CT imaging of the head and cervical spine was performed following the standard protocol without intravenous contrast. Multiplanar CT image reconstructions of the cervical spine were also generated. COMPARISON:  CT cervical spine dated July 28, 2017.  CT head dated Jun 16, 2015. FINDINGS: CT HEAD FINDINGS Brain: No evidence of acute infarction, hemorrhage,  hydrocephalus, extra-axial collection or mass lesion/mass effect. Stable cerebral atrophy. Vascular: Atherosclerotic vascular calcification of the carotid siphons. No hyperdense vessel. Skull: Negative for fracture. Innumerable tiny lucent lesions throughout the skull are unchanged since 2017 and likely due to osteopenia. Sinuses/Orbits: No acute finding. Unchanged partial opacification of the right mastoid air cells. Small focal deformity along the left medial orbital wall is unchanged and remains consistent with prior trauma. Other: None. CT CERVICAL SPINE FINDINGS Alignment: Unchanged trace anterolisthesis at C2-C3 and 4 mm anterolisthesis at C7-T1. No traumatic malalignment. Straightening of the normal cervical lordosis. Skull base and vertebrae: No acute fracture. No primary bone lesion. Prominent calcified pannus about the dens with unchanged narrowing of the spinal canal and mass effect on the upper cervical spinal cord. Soft tissues and spinal canal: No prevertebral fluid or swelling. No visible canal hematoma. Disc levels: Moderate to severe degenerative disc disease and moderate facet uncovertebral hypertrophy throughout the cervical spine, similar to prior study. Upper chest: Scarring in the left lung apex, unchanged. Other: Bilateral carotid artery calcific atherosclerosis. IMPRESSION: 1.  No acute intracranial abnormality. 2.  No acute cervical spine fracture. 3. Prominent calcified pannus about the dens with unchanged narrowing of the spinal canal and mass effect on the upper cervical spinal cord, likely secondary to CPPD. 4. Moderate to severe degenerative changes of the cervical spine are similar to prior study. Electronically Signed   By: Titus Dubin M.D.   On: 08/08/2017 15:25   Ct Cervical Spine Wo Contrast  Result Date: 08/08/2017 CLINICAL DATA:  Mechanical fall. Hit left side of head.  Complains of right-sided neck pain. On anticoagulation. EXAM: CT HEAD WITHOUT CONTRAST CT CERVICAL SPINE WITHOUT CONTRAST TECHNIQUE: Multidetector CT imaging of the head and cervical spine was performed following the standard protocol without intravenous contrast. Multiplanar CT image reconstructions of the cervical spine were also generated. COMPARISON:  CT cervical spine dated July 28, 2017. CT head dated Jun 16, 2015. FINDINGS: CT HEAD FINDINGS Brain: No evidence of acute infarction, hemorrhage, hydrocephalus, extra-axial collection or mass lesion/mass effect. Stable cerebral atrophy. Vascular: Atherosclerotic vascular calcification of the carotid siphons. No hyperdense vessel. Skull: Negative for fracture. Innumerable tiny lucent lesions throughout the skull are unchanged since 2017 and likely due to osteopenia. Sinuses/Orbits: No acute finding. Unchanged partial opacification of the right mastoid air cells. Small focal deformity along the left medial orbital wall is unchanged and remains consistent with prior trauma. Other: None. CT CERVICAL SPINE FINDINGS Alignment: Unchanged trace anterolisthesis at C2-C3 and 4 mm anterolisthesis at C7-T1. No traumatic malalignment. Straightening of the normal cervical lordosis. Skull base and vertebrae: No acute fracture. No primary bone lesion. Prominent calcified pannus about the dens with unchanged narrowing of the spinal canal and mass effect on the upper cervical spinal cord. Soft tissues and spinal canal: No prevertebral fluid or swelling. No visible canal hematoma. Disc levels: Moderate to severe degenerative disc disease and moderate facet uncovertebral hypertrophy throughout the cervical spine, similar to prior study. Upper chest: Scarring in the left lung apex, unchanged. Other: Bilateral carotid artery calcific atherosclerosis. IMPRESSION: 1.  No acute intracranial abnormality. 2.  No acute cervical spine fracture. 3. Prominent calcified pannus about the dens with  unchanged narrowing of the spinal canal and mass effect on the upper cervical spinal cord, likely secondary to CPPD. 4. Moderate to severe degenerative changes of the cervical spine are similar to prior study. Electronically Signed   By: Titus Dubin M.D.   On: 08/08/2017 15:25  Dg Hip Unilat W Or Wo Pelvis 2-3 Views Left  Result Date: 08/08/2017 CLINICAL DATA:  Acute LEFT hip pain following fall today. Initial encounter. EXAM: DG HIP (WITH OR WITHOUT PELVIS) 2-3V LEFT COMPARISON:  10/05/2015 FINDINGS: No acute fracture, subluxation or dislocation. No suspicious focal bony lesions are present. ORIF proximal RIGHT femur noted. Degenerative changes in the visualized lumbar spine again noted. IMPRESSION: No acute LEFT hip abnormality. Electronically Signed   By: Margarette Canada M.D.   On: 08/08/2017 14:55    Procedures Procedures (including critical care time)  Medications Ordered in ED Medications  acetaminophen (TYLENOL) tablet 325 mg (325 mg Oral Given 08/08/17 1816)     Initial Impression / Assessment and Plan / ED Course  I have reviewed the triage vital signs and the nursing notes.  Pertinent labs & imaging results that were available during my care of the patient were reviewed by me and considered in my medical decision making (see chart for details).     Patient presenting for evaluation after mechanical fall.  Physical exam reassuring, she is neurovascularly intact.  No obvious neurologic deficits.  Tenderness palpation of the left hip.  Will obtain x-ray of the hip, and CT head and neck for further evaluation, as patient is on blood thinners.  Offered Tylenol, patient declined.  Patient currently has a fentanyl patch on.  Patient states she normally takes her blood pressure medicine 3 times a day, has only had her morning dose.  X-ray viewed interpreted by me, no fracture dislocation.  CT without bleed, fracture, or dislocation.  Patient ambulated in hall with her walker, which she uses  at baseline.  Able to ambulate with pain, although ambulating without apparent difficulty. Face to face ordered for home PT. patient to follow-up with her primary care doctor for further evaluation.  Continue fentanyl patch and Tylenol as needed.  At this time, patient appears safe for discharge.  Return precautions given.  Patient states she understands and agrees to plan.  Final Clinical Impressions(s) / ED Diagnoses   Final diagnoses:  Fall, initial encounter  Left hip pain    ED Discharge Orders    None       Franchot Heidelberg, PA-C 08/08/17 1840    Milton Ferguson, MD 08/11/17 1000

## 2017-08-08 NOTE — ED Notes (Signed)
Patient is ready for d/c. D/c discussed with patient. Coordinated transportation back to [facility] with facility staff

## 2017-08-08 NOTE — ED Notes (Signed)
Pt ambulated to and from bathroom with personal walker, but without any additional assistance (supervision only). Pt moved extremely stiffly on her left leg, but did not have any apparent balance or gait issues. Pt complained of pain. RN, [place] notified.

## 2017-08-08 NOTE — Telephone Encounter (Signed)
Sarah Mays from [facility] called and advised that she was sending patient to ER after a fall. She said she fell from a standing position, hit her head and also has left hip pain. She wanted you to be aware.

## 2017-08-08 NOTE — ED Triage Notes (Signed)
Patient is from [facility] via EMS for a fall this morning. She reports pain on her left hip and reports that she hit her head. Patient is on Xarelto, she has fentanyl patch on her R arm. Patient is A&O X4

## 2017-08-08 NOTE — Telephone Encounter (Signed)
She called and left a message to call her.  Called back and left message for her to call the nurse.

## 2017-08-09 ENCOUNTER — Non-Acute Institutional Stay (SKILLED_NURSING_FACILITY): Payer: Medicare Other | Admitting: Internal Medicine

## 2017-08-09 ENCOUNTER — Other Ambulatory Visit: Payer: Self-pay | Admitting: Internal Medicine

## 2017-08-09 ENCOUNTER — Ambulatory Visit
Admission: RE | Admit: 2017-08-09 | Discharge: 2017-08-09 | Disposition: A | Discharge status: Home / Self Care | Payer: Medicare Other | Source: Ambulatory Visit | Attending: Internal Medicine | Admitting: Internal Medicine

## 2017-08-09 ENCOUNTER — Encounter: Payer: Self-pay | Admitting: Internal Medicine

## 2017-08-09 DIAGNOSIS — Z9181 History of falling: Secondary | ICD-10-CM | POA: Diagnosis not present

## 2017-08-09 DIAGNOSIS — M25552 Pain in left hip: Secondary | ICD-10-CM

## 2017-08-09 DIAGNOSIS — R29898 Other symptoms and signs involving the musculoskeletal system: Secondary | ICD-10-CM

## 2017-08-09 DIAGNOSIS — R1030 Lower abdominal pain, unspecified: Secondary | ICD-10-CM | POA: Diagnosis not present

## 2017-08-09 DIAGNOSIS — R1032 Left lower quadrant pain: Secondary | ICD-10-CM

## 2017-08-09 DIAGNOSIS — D6869 Other thrombophilia: Secondary | ICD-10-CM | POA: Diagnosis not present

## 2017-08-09 DIAGNOSIS — M810 Age-related osteoporosis without current pathological fracture: Secondary | ICD-10-CM | POA: Diagnosis not present

## 2017-08-09 DIAGNOSIS — M5030 Other cervical disc degeneration, unspecified cervical region: Secondary | ICD-10-CM | POA: Insufficient documentation

## 2017-08-09 DIAGNOSIS — I480 Paroxysmal atrial fibrillation: Secondary | ICD-10-CM

## 2017-08-09 DIAGNOSIS — E039 Hypothyroidism, unspecified: Secondary | ICD-10-CM | POA: Diagnosis not present

## 2017-08-09 DIAGNOSIS — M545 Low back pain, unspecified: Secondary | ICD-10-CM

## 2017-08-09 DIAGNOSIS — R58 Hemorrhage, not elsewhere classified: Secondary | ICD-10-CM | POA: Diagnosis not present

## 2017-08-09 DIAGNOSIS — I4891 Unspecified atrial fibrillation: Secondary | ICD-10-CM

## 2017-08-09 DIAGNOSIS — N1339 Other hydronephrosis: Secondary | ICD-10-CM | POA: Diagnosis not present

## 2017-08-09 NOTE — Progress Notes (Signed)
Patient ID: Sarah Mays, female   DOB: 05/24/1930, 84 y.o.   MRN: [id number]  Provider:  Rexene Edison. Mariea Clonts, D.O., C.M.D. Location:  [place] Room Number: 159 rehab Place of Service:  SNF (31)  PCP: Gayland Curry, DO Patient Care Team: Gayland Curry, DO as PCP - General (Geriatric Medicine) Larey Dresser, MD as PCP - Cardiology (Cardiology) [facility], Clent Demark, MD as Consulting Physician (Ophthalmology) Larey Dresser, MD as Consulting Physician (Cardiology) Heath Lark, MD as Consulting Physician (Hematology and Oncology) Clent Jacks, MD as Consulting Physician (Ophthalmology) Jerrell Belfast, MD as Consulting Physician (Otolaryngology)  Extended Emergency Contact Information Primary Emergency Contact: [place] Phone: [phone] Mobile Phone: [phone] Relation: Daughter Secondary Emergency Contact: Dicioccio,[name] Address: [address], [place] [address] [place] Phone: [phone] Relation: Spouse  Code Status: DNR Goals of Care: Advanced Directive information Advanced Directives 08/09/2017  Does Patient Have a Medical Advance Directive? Yes  Type of Paramedic of [place];Living will;Out of facility DNR (pink MOST or yellow form)  Does patient want to make changes to medical advance directive? No - Patient declined  Copy of [place] in Chart? Yes  Would patient like information on creating a medical advance directive? -  Pre-existing out of facility DNR order (yellow form or pink MOST form) Yellow form placed in chart (order not valid for inpatient use)      Chief Complaint  Patient presents with  . New Admit To SNF    Rehab admission    HPI: Patient is a 84 y.o. female with h/o afib s/p cardioversion, senile osteoporosis on prolia  now, chronic diastolic chf, htn, gout, prior kidney stones, HOH, hypothyroidism seen today for admission to [facility] s/p ED visit on 7/1 with fall on left hip while taking things out of the refrigerator to make a sandwich--she tripped on her rug at the entry.  She says her pain is better than yesterday, but she's barely able to lift the left foot off the ground due to pain, wincing as she does so.  She is using her walker.    Past Medical History:  Diagnosis Date  . Abnormal stress test    a. 11/2011 Ex MV: EF 80%, small, partially reversible anteroapical defect->mild ischemia vs attenuation-->med Rx.  [name] Kitchen Alopecia, unspecified   . Arthritis   . Chronic diastolic CHF (congestive heart failure) ([facility])    a. 11/2013 Echo: EF 60-65%, no rwma, mild TR, PASP 55mmHg.  . Closed fracture of lower end of radius with ulna   . Deafness    Left, s/p multiple surgeries  . Essential hypertension   . Gouty arthropathy, unspecified   . History of kidney stones   . Hypothyroidism   . Insomnia, unspecified   . Macular degeneration    Left, s/p inj. tx  . Mitral valve disorders(424.0)   . Neck mass    right, w/u including MRI negative  . PAF (paroxysmal atrial fibrillation) (HCC)    a. on amio (02/2012 mild obstruction on PFT's) and xarelto.  . Polycythemia vera(238.4)   . Pure hypercholesterolemia   . Senile osteoporosis    Reclast in the past  . Tricuspid valve disorders, specified as nonrheumatic    Past Surgical History:  Procedure Laterality  Date  . BREAST BIOPSY  06/18/1998   left  . CARDIOVERSION N/A 07/15/2017   Procedure: CARDIOVERSION;  Surgeon: Larey Dresser, MD;  Location: [facility] ENDOSCOPY;  Service: Cardiovascular;  Laterality: N/A;  . ELECTROPHYSIOLOGIC STUDY N/A 01/08/2016   Procedure: Atrial Fibrillation Ablation;  Surgeon: Thompson Grayer, MD;  Location: [facility] CV LAB;  Service: Cardiovascular;  Laterality: N/A;  . FEMUR IM NAIL Right 10/08/2015   Procedure: INTRAMEDULLARY  (IM) NAIL FEMORAL RIGHT;  Surgeon: Paralee Cancel, MD;  Location: [facility] ORS;  Service: Orthopedics;  Laterality: Right;  . MASS EXCISION Right 11/26/2016   Procedure: EXCISION RIGHT LATERAL NECK MASS;  Surgeon: Jerrell Belfast, MD;  Location: [facility];  Service: ENT;  Laterality: Right;  . TEE WITHOUT CARDIOVERSION N/A 07/15/2017   Procedure: TRANSESOPHAGEAL ECHOCARDIOGRAM (TEE);  Surgeon: Larey Dresser, MD;  Location: [facility] ENDOSCOPY;  Service: Cardiovascular;  Laterality: N/A;  . TONSILLECTOMY    . TOTAL ABDOMINAL HYSTERECTOMY    . TYMPANOPLASTY Bilateral     reports that she quit smoking about 33 years ago. Her smoking use included cigarettes. She has never used smokeless tobacco. She reports that she drinks about 0.6 oz of alcohol per week. She reports that she does not use drugs. Social History   Socioeconomic History  . Marital status: Married    Spouse name: [name]  . Number of children: 2  . Years of education: 54  . Highest education level: Not on file  Occupational History  . Not on file  Social Needs  . Financial resource strain: Not on file  . Food insecurity:    Worry: Not on file    Inability: Not on file  . Transportation needs:    Medical: Not on file    Non-medical: Not on file  Tobacco Use  . Smoking status: Former Smoker    Types: Cigarettes    Last attempt to quit: 01/19/1984    Years since quitting: 33.5  . Smokeless tobacco: Never Used  Substance and Sexual Activity  . Alcohol use: Yes    Alcohol/week: 0.6 oz    Types: 1 Glasses of wine per week    Comment: 2 per day/ 14 a week  . Drug use: No  . Sexual activity: Not Currently  Lifestyle  . Physical activity:    Days per week: Not on file    Minutes per session: Not on file  . Stress: Not on file  Relationships  . Social connections:    Talks on phone: Not on file    Gets together: Not on file    Attends religious service: Not on file    Active member of club or organization: Not on file    Attends  meetings of clubs or organizations: Not on file    Relationship status: Not on file  . Intimate partner violence:    Fear of current or ex partner: Not on file    Emotionally abused: Not on file    Physically abused: Not on file    Forced sexual activity: Not on file  Other Topics Concern  . Not on file  Social History Narrative   Patient is Married 1955. 2 kids. 72 grandkid-53 years old in 2016. College graduate [name]. Of [place]).    Lives in apartment,  Independent Living  section at [facility] since 02/2013.      Stay at home mother.       No Smoking history, Mod. alcohol use.   Patient has a living  will, POA      Hobbies: CPU and painting          Functional Status Survey:    Family History  Problem Relation Age of Onset  . Hypertension Father   . Heart disease Father        patient does not know details.   . Ovarian cancer Mother 57  . Cancer Sister        colon  . Heart disease Sister   . Cancer Sister   . Cancer Sister        hodgkin disease  . Breast cancer Daughter   . Cancer Daughter        breast    Health Maintenance  Topic Date Due  . INFLUENZA VACCINE  09/08/2017  . TETANUS/TDAP  01/13/2027  . DEXA SCAN  Completed  . PNA vac Low Risk Adult  Completed    Allergies  Allergen Reactions  . Amlodipine Swelling and Other (See Comments)    Reaction:  Lower extremity swelling     Outpatient Encounter Medications as of 08/09/2017  Medication Sig  . Cholecalciferol (VITAMIN D) 2000 units CAPS Take 2,000 Units by mouth daily.   [name] Kitchen docusate sodium (COLACE) 50 MG capsule Take 100 mg by mouth 2 (two) times daily.  . fentaNYL (DURAGESIC - DOSED MCG/HR) 25 MCG/HR patch Place 1 patch (25 mcg total) onto the skin every 3 (three) days.  . furosemide (LASIX) 40 MG tablet Take 2 tablets (80 mg total) by mouth daily.  [name] Kitchen gabapentin (NEURONTIN) 100 MG capsule Take 100 mg by mouth at bedtime.  . hydrALAZINE (APRESOLINE) 50 MG tablet TAKE 1 1/2  TABLETS THREE TIMES DAILY.  . hydroxyurea (HYDREA) 500 MG capsule 500 mg on Mondays, Wednesdays and Fridays and 1000 mg the other days of the week by mouth  . levothyroxine (SYNTHROID, LEVOTHROID) 100 MCG tablet Take 1 tablet (100 mcg total) by mouth daily before breakfast.  . Magnesium 250 MG TABS Take 250 mg by mouth daily.   . Melatonin 10 MG TABS Take 10 mg by mouth at bedtime.  . Multiple Vitamins-Minerals (PRESERVISION AREDS 2) CAPS Take 1 capsule by mouth 2 (two) times daily.   . polycarbophil (FIBERCON) 625 MG tablet Take 2 tablets (1,250 mg total) by mouth 2 (two) times daily.  . potassium chloride (K-DUR) 10 MEQ tablet TAKE 1 TABLET BY MOUTH TWICE DAILY.  . sodium chloride (OCEAN) 0.65 % SOLN nasal spray Place 2 sprays into both nostrils as needed for congestion.  . traMADol (ULTRAM) 50 MG tablet TAKE ONE TABLET AT BEDTIME.  [name] Kitchen XARELTO 15 MG TABS tablet TAKE 1 TABLET ONCE DAILY.   No facility-administered encounter medications on file as of 08/09/2017.     Review of Systems  Constitutional: Negative for activity change, appetite change, chills and fever.  HENT: Positive for hearing loss. Negative for congestion and trouble swallowing.   Eyes: Negative for visual disturbance.  Respiratory: Negative for chest tightness, shortness of breath and wheezing.   Cardiovascular: Negative for chest pain, palpitations and leg swelling.  Gastrointestinal: Negative for abdominal distention, abdominal pain, constipation and diarrhea.  Genitourinary: Negative for dysuria.  Musculoskeletal: Positive for arthralgias, back pain, gait problem, myalgias and neck pain.       Severe right groin and right hip pain  Skin: Negative for color change.  Neurological: Negative for dizziness and weakness.  Hematological: Bruises/bleeds easily.  Psychiatric/Behavioral: Negative for confusion.    Vitals:   08/09/17 1001  BP: (!) 158/85  Pulse: 83  Resp: 18  Temp: 98.4 F (36.9 C)  TempSrc: Oral  SpO2:  96%  Weight: 101 lb (45.8 kg)  Height: 5' (1.524 m)   Body mass index is 19.73 kg/m. Physical Exam  Constitutional: She is oriented to person, place, and time. She appears well-developed and well-nourished.  Cardiovascular: Normal rate, regular rhythm, normal heart sounds and intact distal pulses.  Pulmonary/Chest: Effort normal and breath sounds normal. No respiratory distress.  Musculoskeletal:  Significant tenderness over left greater tronchanter/acetabulum region and left groin, limping and dragging left leg as walking with rolling walker with skis  Neurological: She is alert and oriented to person, place, and time.  Skin: Skin is warm and dry.  No visible ecchymoses  Psychiatric: She has a normal mood and affect.    Labs reviewed: Basic Metabolic Panel: Recent Labs    02/07/17 2305  07/07/17 1528 07/26/17 0954 07/28/17 0800  NA 133*   < > 138 140 141  K 3.8   < > 3.8 4.2 5.0  CL 97*  --  102 102  --   CO2 27  --  30 32  --   GLUCOSE 117*  --  88 98  --   BUN 26*   < > 16 37* 38*  CREATININE 0.80   < > 0.82 0.83 0.7  CALCIUM 10.7*  --  10.7* 10.9*  --    < > = values in this interval not displayed.   Liver Function Tests: Recent Labs    09/14/16 1428 02/07/17 2305 07/26/17 0954 07/28/17 0800  AST 17 21 24 23   ALT 18 16 30 30   ALKPHOS 62 59 47 62  BILITOT 1.00 0.9 0.5  --   PROT 7.0 6.6 5.5*  --   ALBUMIN 4.6 4.7 3.8  --    No results for input(s): LIPASE, AMYLASE in the last 8760 hours. No results for input(s): AMMONIA in the last 8760 hours. CBC: Recent Labs    05/10/17 1406 07/07/17 1528 07/26/17 0954 07/28/17 0800 07/29/17 1305  WBC 8.1 8.1 11.8* 15.3 13.2*  NEUTROABS 6.4  --  10.0*  --  11.0*  HGB 13.5 11.6* 11.6* 13.4 12.4  HCT 42.2 36.3 36.4 41 38.2  MCV 107.3* 112.0* 112.3*  --  113.4*  PLT 454* 535* 523* 595* 543*   Cardiac Enzymes: No results for input(s): CKTOTAL, CKMB, CKMBINDEX, TROPONINI in the last 8760 hours. BNP: Invalid  input(s): POCBNP No results found for: HGBA1C Lab Results  Component Value Date   TSH 1.37 07/28/2017   Lab Results  Component Value Date   VITAMINB12 352 07/29/2017   No results found for: FOLATE Lab Results  Component Value Date   IRON 99 07/29/2017   TIBC 323 07/29/2017   FERRITIN 44 07/29/2017    Imaging and Procedures obtained prior to SNF admission: Ct Head Wo Contrast  Result Date: 08/08/2017 CLINICAL DATA:  Mechanical fall. Hit left side of head. Complains of right-sided neck pain. On anticoagulation. EXAM: CT HEAD WITHOUT CONTRAST CT CERVICAL SPINE WITHOUT CONTRAST TECHNIQUE: Multidetector CT imaging of the head and cervical spine was performed following the standard protocol without intravenous contrast. Multiplanar CT image reconstructions of the cervical spine were also generated. COMPARISON:  CT cervical spine dated July 28, 2017. CT head dated Jun 16, 2015. FINDINGS: CT HEAD FINDINGS Brain: No evidence of acute infarction, hemorrhage, hydrocephalus, extra-axial collection or mass lesion/mass effect. Stable cerebral atrophy. Vascular: Atherosclerotic vascular calcification of the carotid siphons. No hyperdense  vessel. Skull: Negative for fracture. Innumerable tiny lucent lesions throughout the skull are unchanged since 2017 and likely due to osteopenia. Sinuses/Orbits: No acute finding. Unchanged partial opacification of the right mastoid air cells. Small focal deformity along the left medial orbital wall is unchanged and remains consistent with prior trauma. Other: None. CT CERVICAL SPINE FINDINGS Alignment: Unchanged trace anterolisthesis at C2-C3 and 4 mm anterolisthesis at C7-T1. No traumatic malalignment. Straightening of the normal cervical lordosis. Skull base and vertebrae: No acute fracture. No primary bone lesion. Prominent calcified pannus about the dens with unchanged narrowing of the spinal canal and mass effect on the upper cervical spinal cord. Soft tissues and spinal  canal: No prevertebral fluid or swelling. No visible canal hematoma. Disc levels: Moderate to severe degenerative disc disease and moderate facet uncovertebral hypertrophy throughout the cervical spine, similar to prior study. Upper chest: Scarring in the left lung apex, unchanged. Other: Bilateral carotid artery calcific atherosclerosis. IMPRESSION: 1.  No acute intracranial abnormality. 2.  No acute cervical spine fracture. 3. Prominent calcified pannus about the dens with unchanged narrowing of the spinal canal and mass effect on the upper cervical spinal cord, likely secondary to CPPD. 4. Moderate to severe degenerative changes of the cervical spine are similar to prior study. Electronically Signed   By: Titus Dubin M.D.   On: 08/08/2017 15:25   Ct Cervical Spine Wo Contrast  Result Date: 08/08/2017 CLINICAL DATA:  Mechanical fall. Hit left side of head. Complains of right-sided neck pain. On anticoagulation. EXAM: CT HEAD WITHOUT CONTRAST CT CERVICAL SPINE WITHOUT CONTRAST TECHNIQUE: Multidetector CT imaging of the head and cervical spine was performed following the standard protocol without intravenous contrast. Multiplanar CT image reconstructions of the cervical spine were also generated. COMPARISON:  CT cervical spine dated July 28, 2017. CT head dated Jun 16, 2015. FINDINGS: CT HEAD FINDINGS Brain: No evidence of acute infarction, hemorrhage, hydrocephalus, extra-axial collection or mass lesion/mass effect. Stable cerebral atrophy. Vascular: Atherosclerotic vascular calcification of the carotid siphons. No hyperdense vessel. Skull: Negative for fracture. Innumerable tiny lucent lesions throughout the skull are unchanged since 2017 and likely due to osteopenia. Sinuses/Orbits: No acute finding. Unchanged partial opacification of the right mastoid air cells. Small focal deformity along the left medial orbital wall is unchanged and remains consistent with prior trauma. Other: None. CT CERVICAL SPINE  FINDINGS Alignment: Unchanged trace anterolisthesis at C2-C3 and 4 mm anterolisthesis at C7-T1. No traumatic malalignment. Straightening of the normal cervical lordosis. Skull base and vertebrae: No acute fracture. No primary bone lesion. Prominent calcified pannus about the dens with unchanged narrowing of the spinal canal and mass effect on the upper cervical spinal cord. Soft tissues and spinal canal: No prevertebral fluid or swelling. No visible canal hematoma. Disc levels: Moderate to severe degenerative disc disease and moderate facet uncovertebral hypertrophy throughout the cervical spine, similar to prior study. Upper chest: Scarring in the left lung apex, unchanged. Other: Bilateral carotid artery calcific atherosclerosis. IMPRESSION: 1.  No acute intracranial abnormality. 2.  No acute cervical spine fracture. 3. Prominent calcified pannus about the dens with unchanged narrowing of the spinal canal and mass effect on the upper cervical spinal cord, likely secondary to CPPD. 4. Moderate to severe degenerative changes of the cervical spine are similar to prior study. Electronically Signed   By: Titus Dubin M.D.   On: 08/08/2017 15:25   Dg Hip Unilat W Or Wo Pelvis 2-3 Views Left  Result Date: 08/08/2017 CLINICAL DATA:  Acute LEFT hip  pain following fall today. Initial encounter. EXAM: DG HIP (WITH OR WITHOUT PELVIS) 2-3V LEFT COMPARISON:  10/05/2015 FINDINGS: No acute fracture, subluxation or dislocation. No suspicious focal bony lesions are present. ORIF proximal RIGHT femur noted. Degenerative changes in the visualized lumbar spine again noted. IMPRESSION: No acute LEFT hip abnormality. Electronically Signed   By: Margarette Canada M.D.   On: 08/08/2017 14:55    Assessment/Plan 1. Acute hip pain, left -had negative xrays in ED of left hip and pelvis (when opened images, pelvis was done), but pain seems too severe to be just muscular at this point and she's hardly able to bear weight which is  concerning with left groin pain  2. Severe left groin pain -as in #1, check CT pelvis -cont fentanyl patch, tramadol  3. History of fall within past 90 days -had ED workup as struck left temple region on floor also--revealed her chronic DDD of neck and some calcification suggesting CPPD/pseudogout, but no changes of head or neck  4. Senile osteoporosis -continues on prolia which she just got recently and vitamin D therapy  5. Paroxysmal atrial fibrillation (HCC) -cont current eliquis and rate is controlled--in sinus since cardioversion  6. Hypercoagulable state due to atrial fibrillation ([place]) -remains on eliquis, but was wanting to come off due to recurrent epistaxis---hopefully these episodes are resolved now  7. Lumbar back pain -chronically on fentanyl with tramadol at bedtime--uses more tramadol when in between injections  8. Hypothyroidism, unspecified type -cont levothyroxine at current dose,  Lab Results  Component Value Date   TSH 1.37 07/28/2017   9. DDD (degenerative disc disease), cervical -notable on neck xrays after fall, does get chronic neck pain also  Family/ staff Communication: discussed with rehab nursing and nurse manager  Labs/tests ordered: CT pelvis w/o contrast due to left groin pain, great difficulty bearing weight, dragging left leg  Sarah Mays, D.O. [facility] [address], [place] [address] Cell Phone (Mon-Fri 8am-5pm):  [phone] On Call:  [phone] & follow prompts after 5pm & weekends Office Phone:  [phone] Office Fax:  [phone]

## 2017-08-10 ENCOUNTER — Encounter (HOSPITAL_COMMUNITY): Payer: Self-pay | Admitting: Emergency Medicine

## 2017-08-10 ENCOUNTER — Other Ambulatory Visit: Payer: Self-pay

## 2017-08-10 ENCOUNTER — Emergency Department (HOSPITAL_COMMUNITY)
Admission: EM | Admit: 2017-08-10 | Discharge: 2017-08-10 | Disposition: A | Discharge status: Home / Self Care | Payer: Medicare Other | Attending: Emergency Medicine | Admitting: Emergency Medicine

## 2017-08-10 DIAGNOSIS — I2510 Atherosclerotic heart disease of native coronary artery without angina pectoris: Secondary | ICD-10-CM | POA: Insufficient documentation

## 2017-08-10 DIAGNOSIS — I5032 Chronic diastolic (congestive) heart failure: Secondary | ICD-10-CM | POA: Insufficient documentation

## 2017-08-10 DIAGNOSIS — Z87891 Personal history of nicotine dependence: Secondary | ICD-10-CM | POA: Insufficient documentation

## 2017-08-10 DIAGNOSIS — I110 Hypertensive heart disease with heart failure: Secondary | ICD-10-CM | POA: Insufficient documentation

## 2017-08-10 DIAGNOSIS — Z79899 Other long term (current) drug therapy: Secondary | ICD-10-CM | POA: Diagnosis not present

## 2017-08-10 DIAGNOSIS — E039 Hypothyroidism, unspecified: Secondary | ICD-10-CM | POA: Insufficient documentation

## 2017-08-10 DIAGNOSIS — R040 Epistaxis: Secondary | ICD-10-CM | POA: Diagnosis not present

## 2017-08-10 DIAGNOSIS — D649 Anemia, unspecified: Secondary | ICD-10-CM | POA: Diagnosis not present

## 2017-08-10 DIAGNOSIS — N390 Urinary tract infection, site not specified: Secondary | ICD-10-CM | POA: Diagnosis not present

## 2017-08-10 DIAGNOSIS — R319 Hematuria, unspecified: Secondary | ICD-10-CM | POA: Diagnosis not present

## 2017-08-10 LAB — CBC
HCT: 36.8 % (ref 36.0–46.0)
Hemoglobin: 12.1 g/dL (ref 12.0–15.0)
MCH: 37 pg — AB (ref 26.0–34.0)
MCHC: 32.9 g/dL (ref 30.0–36.0)
MCV: 112.5 fL — AB (ref 78.0–100.0)
PLATELETS: 358 10*3/uL (ref 150–400)
RBC: 3.27 MIL/uL — ABNORMAL LOW (ref 3.87–5.11)
RDW: 17.1 % — ABNORMAL HIGH (ref 11.5–15.5)
WBC: 9.4 10*3/uL (ref 4.0–10.5)

## 2017-08-10 LAB — BASIC METABOLIC PANEL
BUN: 20 (ref 4–21)
Creatinine: 0.5 (ref 0.5–1.1)
GLUCOSE: 76
POTASSIUM: 4.5 (ref 3.4–5.3)
Sodium: 136 — AB (ref 137–147)

## 2017-08-10 MED ORDER — OXYMETAZOLINE HCL 0.05 % NA SOLN
1.0000 | Freq: Once | NASAL | Status: DC
  Filled 2017-08-10: qty 15

## 2017-08-10 MED ORDER — TRANEXAMIC ACID 1000 MG/10ML IV SOLN
500.0000 mg | Freq: Once | INTRAVENOUS | Status: AC
  Administered 2017-08-10: 500 mg via TOPICAL
  Filled 2017-08-10 (×2): qty 10

## 2017-08-10 NOTE — ED Triage Notes (Signed)
Pt arriving via [facility] from [facility]. Pt reports she was about to go to bed when the right side of her nose started to bleed. Pt and facility were unable to stop the bleeding.  Pt arrived applying pressure to nose and bleed has slowed down. Pt has a hx of this and is currently on Xarelto.  Pt is hypertensive tonight even after taking her hydralazine.  VS per EMS 178/92, 82 P, 96 RA, 20 RR.  A&O x 4.

## 2017-08-10 NOTE — ED Notes (Addendum)
Report given to [place] at [facility]. Per Hermenia Fiscal from [facility] will be coming to pick pt up.

## 2017-08-10 NOTE — ED Notes (Signed)
Discharge instructions discussed with Pt. Pt verbalized understanding. Pt stable and leaving via [place] with [facility].

## 2017-08-10 NOTE — ED Provider Notes (Signed)
[facility] Provider Note   CSN: [id number] Arrival date & time: 08/10/17  0026     History   Chief Complaint Chief Complaint  Patient presents with  . Epistaxis    HPI Sarah Mays is a 84 y.o. female.  The history is provided by the patient.  Epistaxis   This is a recurrent problem. Episode onset: Prior to arrival. The problem occurs constantly. The problem has been resolved. The problem is associated with anticoagulants. The bleeding has been from the right nare. She has tried applying pressure for the symptoms. The treatment provided moderate relief.  Patient with history of CHF, atrial fibrillation on Xarelto presents with epistaxis.  She reports long history of epistaxis requiring packing.  Tonight her right nare began to bleed spontaneously.  No trauma to face.  No fevers or vomiting.  No new weakness is reported. she Has no other complaints  Past Medical History:  Diagnosis Date  . Abnormal stress test    a. 11/2011 Ex MV: EF 80%, small, partially reversible anteroapical defect->mild ischemia vs attenuation-->med Rx.  [name] Kitchen Alopecia, unspecified   . Arthritis   . Chronic diastolic CHF (congestive heart failure) ([place])    a. 11/2013 Echo: EF 60-65%, no rwma, mild TR, PASP 74mmHg.  . Closed fracture of lower end of radius with ulna   . Deafness    Left, s/p multiple surgeries  . Essential hypertension   . Gouty arthropathy, unspecified   . History of kidney stones   . Hypothyroidism   . Insomnia, unspecified   . Macular degeneration    Left, s/p inj. tx  . Mitral valve disorders(424.0)   . Neck mass    right, w/u including MRI negative  . PAF (paroxysmal atrial fibrillation) (H[facility])    a. on amio (02/2012 mild obstruction on PFT's) and xarelto.  . Polycythemia vera(238.4)   . Pure hypercholesterolemia   . Senile osteoporosis    Reclast in the past  . Tricuspid valve disorders, specified as nonrheumatic     Patient Active  Problem List   Diagnosis Date Noted  . DDD (degenerative disc disease), cervical 08/09/2017  . Hypercoagulable state due to atrial fibrillation ([place]) 08/09/2017  . Presbycusis of both ears 08/03/2017  . Deficiency anemia 07/26/2017  . Recurrent epistaxis 02/25/2017  . Iron deficiency anemia 02/21/2017  . Pure hypercholesterolemia   . PONV (postoperative nausea and vomiting)   . PAF (paroxysmal atrial fibrillation) ([place])   . Neck mass   . Insomnia, unspecified   . History of kidney stones   . Deafness   . Closed fracture of lower end of radius with ulna   . Arthritis   . Abnormal stress test   . Rotator cuff arthropathy, right 09/08/2016  . Trigger point of right shoulder region 05/05/2016  . Spondylosis of cervical region without myelopathy or radiculopathy 05/05/2016  . Chondrocalcinosis 05/05/2016  . Chronic pain disorder 05/05/2016  . A-fib ([place]) 01/08/2016  . Closed fracture of shaft of right femur, initial encounter ([place]) 10/25/2015  . Status post-operative repair of hip fracture 10/25/2015  . Greater trochanteric bursitis of right hip 09/29/2015  . Asymptomatic cholelithiasis 05/09/2015  . Essential hypertension 11/07/2014  . Hair loss 11/07/2014  . Bilateral leg edema 11/07/2014  . Constipation due to pain medication 11/06/2014  . Hypotension due to drugs 11/06/2014  . Chest pain 09/01/2014  . Osteoporosis 01/04/2014  . Gout 01/04/2014  . Headache 01/04/2014  . Hypothyroidism 11/19/2013  . Hypercalcemia  10/11/2013  . Insomnia 05/16/2013  . Lumbar back pain 05/16/2013  . Polycythemia vera ([place])   . Mass of right side of neck   . Senile osteoporosis   . Macular degeneration   . Sick sinus syndrome ([place]) 03/09/2012  . CAD (coronary artery disease) 12/27/2011  . Paroxysmal atrial fibrillation ([place]) 12/27/2011  . Chronic diastolic CHF (congestive heart failure) ([place]) 12/27/2011  . HTN (hypertension) 12/27/2011    Past Surgical History:  Procedure Laterality Date   . BREAST BIOPSY  06/18/1998   left  . CARDIOVERSION N/A 07/15/2017   Procedure: CARDIOVERSION;  Surgeon: Larey Dresser, MD;  Location: [facility] ENDOSCOPY;  Service: Cardiovascular;  Laterality: N/A;  . ELECTROPHYSIOLOGIC STUDY N/A 01/08/2016   Procedure: Atrial Fibrillation Ablation;  Surgeon: Thompson Grayer, MD;  Location: [facility] CV LAB;  Service: Cardiovascular;  Laterality: N/A;  . FEMUR IM NAIL Right 10/08/2015   Procedure: INTRAMEDULLARY (IM) NAIL FEMORAL RIGHT;  Surgeon: Paralee Cancel, MD;  Location: [facility] ORS;  Service: Orthopedics;  Laterality: Right;  . MASS EXCISION Right 11/26/2016   Procedure: EXCISION RIGHT LATERAL NECK MASS;  Surgeon: Jerrell Belfast, MD;  Location: [facility];  Service: ENT;  Laterality: Right;  . TEE WITHOUT CARDIOVERSION N/A 07/15/2017   Procedure: TRANSESOPHAGEAL ECHOCARDIOGRAM (TEE);  Surgeon: Larey Dresser, MD;  Location: [facility] ENDOSCOPY;  Service: Cardiovascular;  Laterality: N/A;  . TONSILLECTOMY    . TOTAL ABDOMINAL HYSTERECTOMY    . TYMPANOPLASTY Bilateral      OB History   None      Home Medications    Prior to Admission medications   Medication Sig Start Date End Date Taking? Authorizing Provider  Cholecalciferol (VITAMIN D) 2000 units CAPS Take 2,000 Units by mouth daily.     [provider]  docusate sodium (COLACE) 50 MG capsule Take 100 mg by mouth 2 (two) times daily.    [provider]  fentaNYL (DURAGESIC - DOSED MCG/HR) 25 MCG/HR patch Place 1 patch (25 mcg total) onto the skin every 3 (three) days. 07/11/17   Reed, Tiffany L, DO  furosemide (LASIX) 40 MG tablet Take 2 tablets (80 mg total) by mouth daily. 05/02/17   Larey Dresser, MD  gabapentin (NEURONTIN) 100 MG capsule Take 100 mg by mouth at bedtime.    [provider]  hydrALAZINE (APRESOLINE) 50 MG tablet TAKE 1 1/2 TABLETS THREE TIMES DAILY. 07/25/17   Larey Dresser, MD  hydroxyurea (HYDREA) 500 MG capsule 500 mg on Mondays, Wednesdays and Fridays and 1000 mg  the other days of the week by mouth 08/08/17   Heath Lark, MD  levothyroxine (SYNTHROID, LEVOTHROID) 100 MCG tablet Take 1 tablet (100 mcg total) by mouth daily before breakfast. 06/30/17   Reed, Tiffany L, DO  Magnesium 250 MG TABS Take 250 mg by mouth daily.     [provider]  Melatonin 10 MG TABS Take 10 mg by mouth at bedtime.    [provider]  Multiple Vitamins-Minerals (PRESERVISION AREDS 2) CAPS Take 1 capsule by mouth 2 (two) times daily.     [provider]  polycarbophil (FIBERCON) 625 MG tablet Take 2 tablets (1,250 mg total) by mouth 2 (two) times daily. 10/10/15   Arrien, Jimmy Picket, MD  potassium chloride (K-DUR) 10 MEQ tablet TAKE 1 TABLET BY MOUTH TWICE DAILY. 07/18/17   Reed, Tiffany L, DO  sodium chloride (OCEAN) 0.65 % SOLN nasal spray Place 2 sprays into both nostrils as needed for congestion.  [provider]  traMADol (ULTRAM) 50 MG tablet TAKE ONE TABLET AT BEDTIME. 07/22/17   Reed, Tiffany L, DO  XARELTO 15 MG TABS tablet TAKE 1 TABLET ONCE DAILY. 07/19/17   Larey Dresser, MD    Family History Family History  Problem Relation Age of Onset  . Hypertension Father   . Heart disease Father        patient does not know details.   . Ovarian cancer Mother 52  . Cancer Sister        colon  . Heart disease Sister   . Cancer Sister   . Cancer Sister        hodgkin disease  . Breast cancer Daughter   . Cancer Daughter        breast    Social History Social History   Tobacco Use  . Smoking status: Former Smoker    Types: Cigarettes    Last attempt to quit: 01/19/1984    Years since quitting: 33.5  . Smokeless tobacco: Never Used  Substance Use Topics  . Alcohol use: Yes    Alcohol/week: 0.6 oz    Types: 1 Glasses of wine per week    Comment: 2 per day/ 14 a week  . Drug use: No     Allergies   Amlodipine   Review of Systems Review of Systems  Constitutional: Negative for fever.  HENT: Positive for  nosebleeds.   Gastrointestinal: Negative for vomiting.  Neurological: Negative for weakness.  All other systems reviewed and are negative.    Physical Exam Updated Vital Signs BP 139/80   Pulse 80   Temp 98.2 F (36.8 C) (Oral)   Resp 16   Ht 1.524 m (5')   Wt 45.8 kg (101 lb)   SpO2 96%   BMI 19.73 kg/m   Physical Exam CONSTITUTIONAL: Elderly, no acute HEAD: Normocephalic/atraumatic EYES: EOMI ENMT: Mucous membranes moist dried blood in right nare, no hematoma, left nare clear, no blood in oropharynx NECK: supple no meningeal signs CV: Irregular LUNGS: Lungs are clear to auscultation bilaterally, no apparent distress ABDOMEN: soft, nontender NEURO: Pt is awake/alert/appropriate, moves all extremitiesx4.  No facial droop.   EXTREMITIES: pulses normal/equal SKIN: warm, color normal PSYCH: no abnormalities of mood noted, alert and oriented to situation   ED Treatments / Results  Labs (all labs ordered are listed, but only abnormal results are displayed) Labs Reviewed  CBC - Abnormal; Notable for the following components:      Result Value   RBC 3.27 (*)    MCV 112.5 (*)    MCH 37.0 (*)    RDW 17.1 (*)    All other components within normal limits    EKG None  Radiology   Procedures .Epistaxis Management Date/Time: 08/10/2017 3:26 AM Performed by: Ripley Fraise, MD Authorized by: Ripley Fraise, MD   Consent:    Consent obtained:  Verbal   Consent given by:  Patient Anesthesia (see MAR for exact dosages):    Anesthesia method:  None Procedure details:    Repair method: TXA placed to right nare for several minutes.   Treatment complexity:  Limited   Treatment episode: initial   Post-procedure details:    Assessment:  Bleeding stopped   Patient tolerance of procedure:  Tolerated well, no immediate complications   (including critical care time)  Medications Ordered in ED Medications  oxymetazoline (AFRIN) 0.05 % nasal spray 1 spray (1 spray  Each Nare Not Given 08/10/17 0208)  tranexamic acid (  CYKLOKAPRON) injection 500 mg (has no administration in time range)     Initial Impression / Assessment and Plan / ED Course  I have reviewed the triage vital signs and the nursing notes.  Pertinent labs  results that were available during my care of the patient were reviewed by me and considered in my medical decision making (see chart for details).     Patient presents with epistaxis.  This responded to pressure as well as TXA.  No packing required.  No bleeding at this time.  Will discharge  Final Clinical Impressions(s) / ED Diagnoses   Final diagnoses:  Right-sided epistaxis    ED Discharge Orders    None       Ripley Fraise, MD 08/10/17 [phone]

## 2017-08-12 ENCOUNTER — Encounter: Payer: Self-pay | Admitting: Hematology and Oncology

## 2017-08-12 ENCOUNTER — Encounter: Payer: Self-pay | Admitting: Internal Medicine

## 2017-08-12 ENCOUNTER — Other Ambulatory Visit: Payer: Self-pay | Admitting: Adult Health

## 2017-08-12 ENCOUNTER — Encounter (HOSPITAL_COMMUNITY): Payer: Self-pay | Admitting: Cardiology

## 2017-08-12 DIAGNOSIS — M545 Low back pain, unspecified: Secondary | ICD-10-CM

## 2017-08-12 MED ORDER — FENTANYL 25 MCG/HR TD PT72
25.0000 ug | MEDICATED_PATCH | TRANSDERMAL | 0 refills | Status: DC
Start: 2017-08-12 — End: 2017-09-12

## 2017-08-12 MED ORDER — TRAMADOL HCL 50 MG PO TABS: 50.0000 mg | ORAL_TABLET | Freq: Two times a day (BID) | ORAL | 0 refills | Status: DC

## 2017-08-15 ENCOUNTER — Inpatient Hospital Stay: Payer: Medicare Other | Admitting: Hematology and Oncology

## 2017-08-15 ENCOUNTER — Inpatient Hospital Stay: Payer: Medicare Other

## 2017-08-15 ENCOUNTER — Encounter: Payer: Self-pay | Admitting: Family Medicine

## 2017-08-15 ENCOUNTER — Other Ambulatory Visit: Payer: Medicare Other

## 2017-08-16 ENCOUNTER — Encounter: Payer: Self-pay | Admitting: Internal Medicine

## 2017-08-16 ENCOUNTER — Non-Acute Institutional Stay (SKILLED_NURSING_FACILITY): Payer: Medicare Other | Admitting: Internal Medicine

## 2017-08-16 DIAGNOSIS — M810 Age-related osteoporosis without current pathological fracture: Secondary | ICD-10-CM | POA: Diagnosis not present

## 2017-08-16 DIAGNOSIS — G8929 Other chronic pain: Secondary | ICD-10-CM | POA: Diagnosis not present

## 2017-08-16 DIAGNOSIS — R040 Epistaxis: Secondary | ICD-10-CM

## 2017-08-16 DIAGNOSIS — M6389 Disorders of muscle in diseases classified elsewhere, multiple sites: Secondary | ICD-10-CM | POA: Diagnosis not present

## 2017-08-16 DIAGNOSIS — R1030 Lower abdominal pain, unspecified: Secondary | ICD-10-CM

## 2017-08-16 DIAGNOSIS — R2689 Other abnormalities of gait and mobility: Secondary | ICD-10-CM | POA: Diagnosis not present

## 2017-08-16 DIAGNOSIS — M62552 Muscle wasting and atrophy, not elsewhere classified, left thigh: Secondary | ICD-10-CM | POA: Diagnosis not present

## 2017-08-16 DIAGNOSIS — M5442 Lumbago with sciatica, left side: Secondary | ICD-10-CM | POA: Diagnosis not present

## 2017-08-16 DIAGNOSIS — R0781 Pleurodynia: Secondary | ICD-10-CM | POA: Diagnosis not present

## 2017-08-16 DIAGNOSIS — Z9181 History of falling: Secondary | ICD-10-CM | POA: Diagnosis not present

## 2017-08-16 DIAGNOSIS — R1032 Left lower quadrant pain: Secondary | ICD-10-CM

## 2017-08-16 DIAGNOSIS — M545 Low back pain: Secondary | ICD-10-CM | POA: Diagnosis not present

## 2017-08-16 DIAGNOSIS — R278 Other lack of coordination: Secondary | ICD-10-CM | POA: Diagnosis not present

## 2017-08-16 DIAGNOSIS — M25552 Pain in left hip: Secondary | ICD-10-CM | POA: Diagnosis not present

## 2017-08-16 DIAGNOSIS — W01198A Fall on same level from slipping, tripping and stumbling with subsequent striking against other object, initial encounter: Secondary | ICD-10-CM | POA: Diagnosis not present

## 2017-08-16 DIAGNOSIS — M62562 Muscle wasting and atrophy, not elsewhere classified, left lower leg: Secondary | ICD-10-CM | POA: Diagnosis not present

## 2017-08-16 DIAGNOSIS — M62561 Muscle wasting and atrophy, not elsewhere classified, right lower leg: Secondary | ICD-10-CM | POA: Diagnosis not present

## 2017-08-16 MED ORDER — ACETAMINOPHEN 325 MG PO TABS: 650.0000 mg | ORAL_TABLET | ORAL | 0 refills | Status: DC | PRN

## 2017-08-16 MED ORDER — METHOCARBAMOL 500 MG PO TABS: 500.0000 mg | ORAL_TABLET | Freq: Two times a day (BID) | ORAL | 0 refills | Status: DC | PRN

## 2017-08-16 NOTE — Progress Notes (Signed)
Location:  [facility]   Place of Service:  SNF (31)  Provider: Avry Monteleone L. Mariea Clonts, D.O., C.M.D.  Code Status: DNR Goals of Care:  Advanced Directives 08/09/2017  Does Patient Have a Medical Advance Directive? Yes  Type of Paramedic of [place];Living will;Out of facility DNR (pink MOST or yellow form)  Does patient want to make changes to medical advance directive? No - Patient declined  Copy of [place] in Chart? Yes  Would patient like information on creating a medical advance directive? -  Pre-existing out of facility DNR order (yellow form or pink MOST form) Yellow form placed in chart (order not valid for inpatient use)    Chief Complaint  Patient presents with  . Acute Visit    persistent left groin and pubic pain with negative xrays of left hip and pelvis and negative CT    HPI: Patient is a 84 y.o. female seen today for an acute visit for persistent left groin and pubic pain. [place]. She had difficulty bearing weight on the left side with severe left groin pain when I saw her 7/2 so CT pelvis was ordered, but only significant abnormality was RIGHT hydronephrosis with UPJ obstruction.  Since then, she had to go back to the ED with right-sided epistaxis again. She has been avoiding tramadol bid and extra prn due to constipation; however, she's been using tylenol 650mg  and it's now ordered q8hr prn, but she feels like it would be better closer together.  She's used it 2-3 times per day sometimes as close as 3 hrs apart to as rarely as 12 hrs apart the past 6 days.  She reports she actually developed a new pain when she was getting in and out of bed in the past day.  It now hurts over her lower left posterior rib region--it's tender to touch and she put my hand over the sore area.  She has been getting cold compresses and the tylenol is helping it some.  Her groin pain had been better until  around the same time this new pain developed.  She's not sure if she's taken muscle relaxers before but would be willing to try for this new pain.  She feels best sitting up in the recliner.    Past Medical History:  Diagnosis Date  . Abnormal stress test    a. 11/2011 Ex MV: EF 80%, small, partially reversible anteroapical defect->mild ischemia vs attenuation-->med Rx.  [name] Kitchen Alopecia, unspecified   . Arthritis   . Chronic diastolic CHF (congestive heart failure) ([place])    a. 11/2013 Echo: EF 60-65%, no rwma, mild TR, PASP 60mmHg.  . Closed fracture of lower end of radius with ulna   . Deafness    Left, s/p multiple surgeries  . Essential hypertension   . Gouty arthropathy, unspecified   . History of kidney stones   . Hypothyroidism   . Insomnia, unspecified   . Macular degeneration    Left, s/p inj. tx  . Mitral valve disorders(424.0)   . Neck mass    right, w/u including MRI negative  . PAF (paroxysmal atrial fibrillation) ([facility])    a. on amio (02/2012 mild obstruction on PFT's) and xarelto.  . Polycythemia vera(238.4)   . Pure hypercholesterolemia   . Senile osteoporosis    Reclast in the past  . Tricuspid valve disorders, specified as nonrheumatic     Past Surgical History:  Procedure Laterality Date  . BREAST BIOPSY  06/18/1998   left  . CARDIOVERSION N/A 07/15/2017   Procedure: CARDIOVERSION;  Surgeon: Larey Dresser, MD;  Location: [facility] ENDOSCOPY;  Service: Cardiovascular;  Laterality: N/A;  . ELECTROPHYSIOLOGIC STUDY N/A 01/08/2016   Procedure: Atrial Fibrillation Ablation;  Surgeon: Thompson Grayer, MD;  Location: [facility] CV LAB;  Service: Cardiovascular;  Laterality: N/A;  . FEMUR IM NAIL Right 10/08/2015   Procedure: INTRAMEDULLARY (IM) NAIL FEMORAL RIGHT;  Surgeon: Paralee Cancel, MD;  Location: [facility] ORS;  Service: Orthopedics;  Laterality: Right;  . MASS EXCISION Right 11/26/2016   Procedure: EXCISION RIGHT LATERAL NECK MASS;  Surgeon: Jerrell Belfast, MD;  Location: [facility];  Service: ENT;  Laterality: Right;  . TEE WITHOUT CARDIOVERSION N/A 07/15/2017   Procedure: TRANSESOPHAGEAL ECHOCARDIOGRAM (TEE);  Surgeon: Larey Dresser, MD;  Location: [facility] ENDOSCOPY;  Service: Cardiovascular;  Laterality: N/A;  . TONSILLECTOMY    . TOTAL ABDOMINAL HYSTERECTOMY    . TYMPANOPLASTY Bilateral     Allergies  Allergen Reactions  . Amlodipine Swelling and Other (See Comments)    Reaction:  Lower extremity swelling     Outpatient Encounter Medications as of 08/16/2017  Medication Sig  . Cholecalciferol (VITAMIN D) 2000 units CAPS Take 2,000 Units by mouth daily.   [name] Kitchen docusate sodium (COLACE) 50 MG capsule Take 100 mg by mouth 2 (two) times daily.  . fentaNYL (DURAGESIC - DOSED MCG/HR) 25 MCG/HR patch Place 1 patch (25 mcg total) onto the skin every 3 (three) days.  . furosemide (LASIX) 40 MG tablet Take 2 tablets (80 mg total) by mouth daily.  [name] Kitchen gabapentin (NEURONTIN) 100 MG capsule Take 100 mg by mouth at bedtime.  . hydrALAZINE (APRESOLINE) 50 MG tablet TAKE 1 1/2 TABLETS THREE TIMES DAILY.  . hydroxyurea (HYDREA) 500 MG capsule 500 mg on Mondays, Wednesdays and Fridays and 1000 mg the other days of the week by mouth  . levothyroxine (SYNTHROID, LEVOTHROID) 100 MCG tablet Take 1 tablet (100 mcg total) by mouth daily before breakfast.  . Magnesium 250 MG TABS Take 250 mg by mouth daily.   . Melatonin 10 MG TABS Take 10 mg by mouth at bedtime.  . Multiple Vitamins-Minerals (PRESERVISION AREDS 2) CAPS Take 1 capsule by mouth 2 (two) times daily.   . polycarbophil (FIBERCON) 625 MG tablet Take 2 tablets (1,250 mg total) by mouth 2 (two) times daily.  . potassium chloride (K-DUR) 10 MEQ tablet TAKE 1 TABLET BY MOUTH TWICE DAILY.  . sodium chloride (OCEAN) 0.65 % SOLN nasal spray Place 2 sprays into both nostrils as needed for congestion.  . traMADol (ULTRAM) 50 MG tablet Take 1 tablet (50 mg total) by mouth 2 (two) times daily. And 1 tablet qd prn for breakthrough pain  .  XARELTO 15 MG TABS tablet TAKE 1 TABLET ONCE DAILY.   No facility-administered encounter medications on file as of 08/16/2017.     Review of Systems:  Review of Systems  Constitutional: Negative for chills and fever.  HENT: Positive for hearing loss.   Eyes: Negative for blurred vision.  Respiratory: Negative for shortness of breath.   Cardiovascular: Negative for chest pain and palpitations.  Gastrointestinal: Positive for constipation. Negative for abdominal pain.  Genitourinary: Negative for dysuria, flank pain, frequency, hematuria and urgency.  Musculoskeletal: Positive for back pain, falls and joint pain.  Neurological: Positive for weakness. Negative for dizziness.  Endo/Heme/Allergies: Bruises/bleeds easily.  Psychiatric/Behavioral: Negative for memory loss.    Health Maintenance  Topic Date Due  . INFLUENZA VACCINE  09/08/2017  . TETANUS/TDAP  01/13/2027  . DEXA SCAN  Completed  . PNA vac Low Risk Adult  Completed    Physical Exam: Vitals:   08/16/17 1151  BP: (!) 157/84  Pulse: 77  Resp: 20  Temp: 98.2 F (36.8 C)  SpO2: 93%  Weight: 102 lb 12.8 oz (46.6 kg)   Body mass index is 20.08 kg/m. Physical Exam  Constitutional: She is oriented to person, place, and time. She appears well-developed. No distress.  Cardiovascular: Normal rate, regular rhythm, normal heart sounds and intact distal pulses.  Pulmonary/Chest: Effort normal and breath sounds normal. No respiratory distress.  Abdominal: Bowel sounds are normal.  Musculoskeletal: Normal range of motion. She exhibits tenderness.  Tender in left medial groin and left posterior lower ribs/sacroiliac area  Neurological: She is alert and oriented to person, place, and time.  Skin: Skin is warm and dry.  Psychiatric: She has a normal mood and affect.    Labs reviewed: Basic Metabolic Panel: Recent Labs    12/27/16 1427 02/07/17 2305  07/07/17 1528 07/26/17 0954 07/28/17 0800  NA  --  133*   < > 138 140  141  K  --  3.8   < > 3.8 4.2 5.0  CL  --  97*  --  102 102  --   CO2  --  27  --  30 32  --   GLUCOSE  --  117*  --  88 98  --   BUN  --  26*   < > 16 37* 38*  CREATININE  --  0.80   < > 0.82 0.83 0.7  CALCIUM  --  10.7*  --  10.7* 10.9*  --   TSH 0.194*  --   --   --   --  1.37   < > = values in this interval not displayed.   Liver Function Tests: Recent Labs    09/14/16 1428 02/07/17 2305 07/26/17 0954 07/28/17 0800  AST 17 21 24 23   ALT 18 16 30 30   ALKPHOS 62 59 47 62  BILITOT 1.00 0.9 0.5  --   PROT 7.0 6.6 5.5*  --   ALBUMIN 4.6 4.7 3.8  --    No results for input(s): LIPASE, AMYLASE in the last 8760 hours. No results for input(s): AMMONIA in the last 8760 hours. CBC: Recent Labs    05/10/17 1406  07/26/17 0954 07/28/17 0800 07/29/17 1305 08/10/17 0135  WBC 8.1   < > 11.8* 15.3 13.2* 9.4  NEUTROABS 6.4  --  10.0*  --  11.0*  --   HGB 13.5   < > 11.6* 13.4 12.4 12.1  HCT 42.2   < > 36.4 41 38.2 36.8  MCV 107.3*   < > 112.3*  --  113.4* 112.5*  PLT 454*   < > 523* 595* 543* 358   < > = values in this interval not displayed.   Lipid Panel: Recent Labs    07/28/17 0800  CHOL 194  HDL 72*  LDLCALC 102  TRIG 99   No results found for: HGBA1C  Procedures since last visit: Dg Chest 2 View  Result Date: 07/22/2017 CLINICAL DATA:  Right posterior chest pain 1 week. EXAM: CHEST - 2 VIEW COMPARISON:  01/08/2016 and 09/01/2014 FINDINGS: Lungs are adequately inflated with stable mild elevation the right hemidiaphragm. Mild stable linear atelectasis/scarring over the left base. No acute airspace consolidation or effusion.  Moderate stable cardiomegaly. Curvature of the spine unchanged. Two adjacent compression fractures in the region of the upper lumbar spine unchanged. IMPRESSION: No acute cardiopulmonary disease. Minimal left basilar scarring. Stable cardiomegaly. Two adjacent stable compression fractures over the lumbar spine. Electronically Signed   By: Marin Olp M.D.   On: 07/22/2017 13:48   Ct Head Wo Contrast  Result Date: 08/08/2017 CLINICAL DATA:  Mechanical fall. Hit left side of head. Complains of right-sided neck pain. On anticoagulation. EXAM: CT HEAD WITHOUT CONTRAST CT CERVICAL SPINE WITHOUT CONTRAST TECHNIQUE: Multidetector CT imaging of the head and cervical spine was performed following the standard protocol without intravenous contrast. Multiplanar CT image reconstructions of the cervical spine were also generated. COMPARISON:  CT cervical spine dated July 28, 2017. CT head dated Jun 16, 2015. FINDINGS: CT HEAD FINDINGS Brain: No evidence of acute infarction, hemorrhage, hydrocephalus, extra-axial collection or mass lesion/mass effect. Stable cerebral atrophy. Vascular: Atherosclerotic vascular calcification of the carotid siphons. No hyperdense vessel. Skull: Negative for fracture. Innumerable tiny lucent lesions throughout the skull are unchanged since 2017 and likely due to osteopenia. Sinuses/Orbits: No acute finding. Unchanged partial opacification of the right mastoid air cells. Small focal deformity along the left medial orbital wall is unchanged and remains consistent with prior trauma. Other: None. CT CERVICAL SPINE FINDINGS Alignment: Unchanged trace anterolisthesis at C2-C3 and 4 mm anterolisthesis at C7-T1. No traumatic malalignment. Straightening of the normal cervical lordosis. Skull base and vertebrae: No acute fracture. No primary bone lesion. Prominent calcified pannus about the dens with unchanged narrowing of the spinal canal and mass effect on the upper cervical spinal cord. Soft tissues and spinal canal: No prevertebral fluid or swelling. No visible canal hematoma. Disc levels: Moderate to severe degenerative disc disease and moderate facet uncovertebral hypertrophy throughout the cervical spine, similar to prior study. Upper chest: Scarring in the left lung apex, unchanged. Other: Bilateral carotid artery calcific atherosclerosis.  IMPRESSION: 1.  No acute intracranial abnormality. 2.  No acute cervical spine fracture. 3. Prominent calcified pannus about the dens with unchanged narrowing of the spinal canal and mass effect on the upper cervical spinal cord, likely secondary to CPPD. 4. Moderate to severe degenerative changes of the cervical spine are similar to prior study. Electronically Signed   By: Titus Dubin M.D.   On: 08/08/2017 15:25   Ct Chest Wo Contrast  Result Date: 07/28/2017 CLINICAL DATA:  Chest pain. EXAM: CT CHEST WITHOUT CONTRAST TECHNIQUE: Multidetector CT imaging of the chest was performed following the standard protocol without IV contrast. COMPARISON:  Radiographs of July 22, 2017. CT scan of January 05, 2016. FINDINGS: Cardiovascular: 4.1 cm ascending thoracic aortic aneurysm is noted. Mild cardiomegaly is noted. No pericardial effusion is noted. Mediastinum/Nodes: Thyroid gland and esophagus are unremarkable. Calcified left hilar adenopathy is noted most consistent with granulomatous disease. Lungs/Pleura: No pneumothorax or pleural effusion is noted. Mild biapical scarring is noted. Mild right basilar subsegmental atelectasis or scarring is noted. Stable scarring is noted in the right upper lobe. No acute abnormality is noted. Upper Abdomen: Calcified splenic granulomata are noted. Left renal cysts are noted. Musculoskeletal: No chest wall mass or suspicious bone lesions identified. IMPRESSION: 4.1 cm ascending thoracic aortic aneurysm. Recommend annual imaging followup by CTA or MRA. This recommendation follows 2010 ACCF/AHA/AATS/ACR/ASA/SCA/SCAI/SIR/STS/SVM Guidelines for the Diagnosis and Management of Patients with Thoracic Aortic Disease. Circulation. 2010; 121: F093-A355. Calcified left hilar adenopathy is noted most consistent with chronic granulomatous disease or histoplasmosis. Stable scarring is seen in  right upper lobe. No acute pulmonary parenchymal abnormality is noted. Electronically Signed   By:  Marijo Conception, M.D.   On: 07/28/2017 16:07   Ct Cervical Spine Wo Contrast  Result Date: 08/08/2017 CLINICAL DATA:  Mechanical fall. Hit left side of head. Complains of right-sided neck pain. On anticoagulation. EXAM: CT HEAD WITHOUT CONTRAST CT CERVICAL SPINE WITHOUT CONTRAST TECHNIQUE: Multidetector CT imaging of the head and cervical spine was performed following the standard protocol without intravenous contrast. Multiplanar CT image reconstructions of the cervical spine were also generated. COMPARISON:  CT cervical spine dated July 28, 2017. CT head dated Jun 16, 2015. FINDINGS: CT HEAD FINDINGS Brain: No evidence of acute infarction, hemorrhage, hydrocephalus, extra-axial collection or mass lesion/mass effect. Stable cerebral atrophy. Vascular: Atherosclerotic vascular calcification of the carotid siphons. No hyperdense vessel. Skull: Negative for fracture. Innumerable tiny lucent lesions throughout the skull are unchanged since 2017 and likely due to osteopenia. Sinuses/Orbits: No acute finding. Unchanged partial opacification of the right mastoid air cells. Small focal deformity along the left medial orbital wall is unchanged and remains consistent with prior trauma. Other: None. CT CERVICAL SPINE FINDINGS Alignment: Unchanged trace anterolisthesis at C2-C3 and 4 mm anterolisthesis at C7-T1. No traumatic malalignment. Straightening of the normal cervical lordosis. Skull base and vertebrae: No acute fracture. No primary bone lesion. Prominent calcified pannus about the dens with unchanged narrowing of the spinal canal and mass effect on the upper cervical spinal cord. Soft tissues and spinal canal: No prevertebral fluid or swelling. No visible canal hematoma. Disc levels: Moderate to severe degenerative disc disease and moderate facet uncovertebral hypertrophy throughout the cervical spine, similar to prior study. Upper chest: Scarring in the left lung apex, unchanged. Other: Bilateral carotid artery  calcific atherosclerosis. IMPRESSION: 1.  No acute intracranial abnormality. 2.  No acute cervical spine fracture. 3. Prominent calcified pannus about the dens with unchanged narrowing of the spinal canal and mass effect on the upper cervical spinal cord, likely secondary to CPPD. 4. Moderate to severe degenerative changes of the cervical spine are similar to prior study. Electronically Signed   By: Titus Dubin M.D.   On: 08/08/2017 15:25   Ct Cervical Spine Wo Contrast  Result Date: 07/28/2017 CLINICAL DATA:  Chronic right shoulder pain. Pain began 1 month ago. Radiculopathy. EXAM: CT CERVICAL SPINE WITHOUT CONTRAST TECHNIQUE: Multidetector CT imaging of the cervical spine was performed without intravenous contrast. Multiplanar CT image reconstructions were also generated. COMPARISON:  CT neck with contrast 10/26/2016 FINDINGS: Alignment: Grade 1 anterolisthesis is present at C7-T1. There is slight anterolisthesis at C2-3. AP alignment is otherwise anatomic. There straightening of the normal cervical lordosis. Skull base and vertebrae: The craniocervical junction demonstrates calcification of soft tissues about the dens. There is no erosion the dens. This calcification does narrow the spinal canal and impacts the upper cervical spinal cord. Soft tissues and spinal canal: Atherosclerotic calcifications are present at the carotid bifurcations bilaterally. No focal mass lesion present. Previously noted fat containing lesion along the posterior right sternocleidomastoid muscle has been resected. Disc levels: C2-3: Asymmetric left-sided facet hypertrophy is present. There is no significant stenosis. C3-4: Asymmetric right-sided uncovertebral and facet disease is present. Mild right foraminal narrowing is present. Central canal is patent. C4-5: Uncovertebral and facet disease is present bilaterally. This results in moderate bilateral foraminal stenosis. C5-6: Uncovertebral and facet disease is present  bilaterally. Moderate bilateral foraminal narrowing is present. Central canal is patent. C6-7: A broad-based disc osteophyte complex present. Uncovertebral spurring  results in moderate right and mild left foraminal stenosis. C7-T1: Asymmetric right-sided facet hypertrophy and spurring is present. There is no focal disc protrusion or stenosis. Upper chest: Posterior scarring is present on the left. The lung apices are otherwise clear. Thoracic inlet is within normal limits. IMPRESSION: 1. Uncovertebral spurring and facet hypertrophy result in moderate foraminal stenosis bilaterally at C4-5 and C5-6. 2. Moderate right and mild left foraminal narrowing at C6-7. 3. Mild right foraminal narrowing at C3-4. 4. Asymmetric right-sided facet hypertrophy at C7-T1 without focal stenosis. 5. Atherosclerotic changes at the carotid bifurcations bilaterally. 6. Prominent calcification of soft tissue ligaments about the dens results in moderate central canal stenosis with distortion of the ventral surface of the spinal cord. Electronically Signed   By: San Morelle M.D.   On: 07/28/2017 16:12   Ct Pelvis Wo Contrast  Result Date: 08/09/2017 CLINICAL DATA:  Tenderness of LEFT greater trochanter EXAM: CT PELVIS WITHOUT CONTRAST TECHNIQUE: Multidetector CT imaging of the pelvis was performed following the standard protocol without intravenous contrast. Sagittal and coronal MPR images reconstructed from axial data set. COMPARISON:  None FINDINGS: Urinary Tract: Markedly dilated RIGHT renal collecting system with decompressed ureter. No ureteral calcifications or dilatation. Bladder decompressed though demonstrating low descent question cystocele. Bowel: Small-bowel loops unremarkable. Appendix not definitely visualized. Increased stool in colon. Vascular/Lymphatic: Atherosclerotic calcification aorta and iliac arteries. Multiple calcified retroperitoneal phleboliths. Reproductive: Uterus surgically absent. Nonvisualization of  ovaries. Other: No free air or free fluid. No hernia or inflammatory process. Musculoskeletal: Diffuse osseous demineralization. Degenerative disc and facet disease changes at visualized lower lumbar spine with levoconvex scoliosis and orthopedic hardware proximal RIGHT femur. Degenerative changes of pubic symphysis. No fracture or bone destruction. Old muscular planes are atrophic but otherwise symmetric and unremarkable. IMPRESSION: Osseous demineralization with degenerative disc and facet disease changes at visualized lower lumbar spine. No acute osseous abnormalities. Increased stool in colon. Marked RIGHT hydronephrosis with decompressed ureter consistent with UPJ obstruction. Cystocele. Electronically Signed   By: Lavonia Dana M.D.   On: 08/09/2017 18:40   [place] Limited Joint Space Structures Up Right  Result Date: 07/30/2017 Procedure: Real-time Ultrasound Guided Injection of right glenohumeral joint Device: GE Logiq Q7 Ultrasound guided injection is preferred based studies that show increased duration, increased effect, greater accuracy, decreased procedural pain, increased response rate with ultrasound guided versus blind injection. Verbal informed consent obtained. Time-out conducted. Noted no overlying erythema, induration, or other signs of local infection. Skin prepped in a sterile fashion. Local anesthesia: Topical Ethyl chloride. With sterile technique and under real time ultrasound guidance: Joint visualized. 23g 1  inch needle inserted posterior approach. Pictures taken for needle placement. Patient did have injection of 2 cc of 1% lidocaine, 2 cc of 0.5% Marcaine, and 1.0 cc of Kenalog 40 mg/dL. Completed without difficulty Pain immediately resolved suggesting accurate placement of the medication. Advised to call if fevers/chills, erythema, induration, drainage, or persistent bleeding. Images permanently stored and available for review in the ultrasound unit. Impression: Technically successful  ultrasound guided injection.   Dg Hip Unilat W Or Wo Pelvis 2-3 Views Left  Result Date: 08/08/2017 CLINICAL DATA:  Acute LEFT hip pain following fall today. Initial encounter. EXAM: DG HIP (WITH OR WITHOUT PELVIS) 2-3V LEFT COMPARISON:  10/05/2015 FINDINGS: No acute fracture, subluxation or dislocation. No suspicious focal bony lesions are present. ORIF proximal RIGHT femur noted. Degenerative changes in the visualized lumbar spine again noted. IMPRESSION: No acute LEFT hip abnormality. Electronically Signed   By: Dellis Filbert  Hu M.D.   On: 08/08/2017 14:55    Assessment/Plan 1. Severe left groin pain -ongoing, but did temporarily improve until she twisted getting in and out of bed yesterday -now worse again -make tylenol 650mg  q4h prn pain not to exceed 3g/day -cont tramadol, but not using it bid b/c of fear of constipation  2. Rib pain on left side --add robaxin 500mg  po bid prn muscle spasms -tylenol change as above -heat to the lower left posterior ribs   3. Recurrent epistaxis -resolved again, remains on xarelto for her p afib  Labs/tests ordered:  No labs Next appt:  11/30/2017  Lynnleigh Soden L. Ruthmary Occhipinti, D.O. [facility] [address], [place] [address] Cell Phone (Mon-Fri 8am-5pm):  [phone] On Call:  [phone] & follow prompts after 5pm & weekends Office Phone:  [phone] Office Fax:  [phone]

## 2017-08-16 NOTE — Progress Notes (Signed)
A user error has taken place: encounter opened in error, closed for administrative reasons.

## 2017-08-16 NOTE — Addendum Note (Signed)
Addended by: Despina Hidden on: 08/16/2017 01:02 PM   Modules accepted: Orders

## 2017-08-17 DIAGNOSIS — R278 Other lack of coordination: Secondary | ICD-10-CM | POA: Diagnosis not present

## 2017-08-17 DIAGNOSIS — M6389 Disorders of muscle in diseases classified elsewhere, multiple sites: Secondary | ICD-10-CM | POA: Diagnosis not present

## 2017-08-17 DIAGNOSIS — M62561 Muscle wasting and atrophy, not elsewhere classified, right lower leg: Secondary | ICD-10-CM | POA: Diagnosis not present

## 2017-08-17 DIAGNOSIS — M25552 Pain in left hip: Secondary | ICD-10-CM | POA: Diagnosis not present

## 2017-08-17 DIAGNOSIS — M62552 Muscle wasting and atrophy, not elsewhere classified, left thigh: Secondary | ICD-10-CM | POA: Diagnosis not present

## 2017-08-17 DIAGNOSIS — M545 Low back pain: Secondary | ICD-10-CM | POA: Diagnosis not present

## 2017-08-18 DIAGNOSIS — R278 Other lack of coordination: Secondary | ICD-10-CM | POA: Diagnosis not present

## 2017-08-18 DIAGNOSIS — M62561 Muscle wasting and atrophy, not elsewhere classified, right lower leg: Secondary | ICD-10-CM | POA: Diagnosis not present

## 2017-08-18 DIAGNOSIS — M62552 Muscle wasting and atrophy, not elsewhere classified, left thigh: Secondary | ICD-10-CM | POA: Diagnosis not present

## 2017-08-18 DIAGNOSIS — M25552 Pain in left hip: Secondary | ICD-10-CM | POA: Diagnosis not present

## 2017-08-18 DIAGNOSIS — M6389 Disorders of muscle in diseases classified elsewhere, multiple sites: Secondary | ICD-10-CM | POA: Diagnosis not present

## 2017-08-18 DIAGNOSIS — M545 Low back pain: Secondary | ICD-10-CM | POA: Diagnosis not present

## 2017-08-19 ENCOUNTER — Other Ambulatory Visit: Payer: Medicare Other

## 2017-08-19 DIAGNOSIS — M62561 Muscle wasting and atrophy, not elsewhere classified, right lower leg: Secondary | ICD-10-CM | POA: Diagnosis not present

## 2017-08-19 DIAGNOSIS — M545 Low back pain: Secondary | ICD-10-CM | POA: Diagnosis not present

## 2017-08-19 DIAGNOSIS — M62552 Muscle wasting and atrophy, not elsewhere classified, left thigh: Secondary | ICD-10-CM | POA: Diagnosis not present

## 2017-08-19 DIAGNOSIS — R278 Other lack of coordination: Secondary | ICD-10-CM | POA: Diagnosis not present

## 2017-08-19 DIAGNOSIS — M25552 Pain in left hip: Secondary | ICD-10-CM | POA: Diagnosis not present

## 2017-08-19 DIAGNOSIS — M6389 Disorders of muscle in diseases classified elsewhere, multiple sites: Secondary | ICD-10-CM | POA: Diagnosis not present

## 2017-08-22 ENCOUNTER — Non-Acute Institutional Stay (SKILLED_NURSING_FACILITY): Payer: Medicare Other | Admitting: Adult Health

## 2017-08-22 ENCOUNTER — Encounter: Payer: Self-pay | Admitting: Adult Health

## 2017-08-22 DIAGNOSIS — I480 Paroxysmal atrial fibrillation: Secondary | ICD-10-CM | POA: Diagnosis not present

## 2017-08-22 DIAGNOSIS — M5030 Other cervical disc degeneration, unspecified cervical region: Secondary | ICD-10-CM

## 2017-08-22 DIAGNOSIS — M25552 Pain in left hip: Secondary | ICD-10-CM | POA: Diagnosis not present

## 2017-08-22 DIAGNOSIS — Q6211 Congenital occlusion of ureteropelvic junction: Secondary | ICD-10-CM | POA: Diagnosis not present

## 2017-08-22 DIAGNOSIS — I10 Essential (primary) hypertension: Secondary | ICD-10-CM | POA: Diagnosis not present

## 2017-08-22 DIAGNOSIS — M62561 Muscle wasting and atrophy, not elsewhere classified, right lower leg: Secondary | ICD-10-CM | POA: Diagnosis not present

## 2017-08-22 DIAGNOSIS — K5903 Drug induced constipation: Secondary | ICD-10-CM | POA: Diagnosis not present

## 2017-08-22 DIAGNOSIS — I5032 Chronic diastolic (congestive) heart failure: Secondary | ICD-10-CM | POA: Diagnosis not present

## 2017-08-22 DIAGNOSIS — M62552 Muscle wasting and atrophy, not elsewhere classified, left thigh: Secondary | ICD-10-CM | POA: Diagnosis not present

## 2017-08-22 DIAGNOSIS — R278 Other lack of coordination: Secondary | ICD-10-CM | POA: Diagnosis not present

## 2017-08-22 DIAGNOSIS — M6389 Disorders of muscle in diseases classified elsewhere, multiple sites: Secondary | ICD-10-CM | POA: Diagnosis not present

## 2017-08-22 DIAGNOSIS — M545 Low back pain: Secondary | ICD-10-CM | POA: Diagnosis not present

## 2017-08-22 NOTE — Progress Notes (Signed)
Location:  Occupational psychologist of Service:  SNF (31)  Provider:  Cindi Carbon, ANP [place] [phone]   PCP: Gayland Curry, DO Patient Care Team: Gayland Curry, DO as PCP - General (Geriatric Medicine) Larey Dresser, MD as PCP - Cardiology (Cardiology) Community, [facility], Clent Demark, MD as Consulting Physician (Ophthalmology) Larey Dresser, MD as Consulting Physician (Cardiology) Heath Lark, MD as Consulting Physician (Hematology and Oncology) Clent Jacks, MD as Consulting Physician (Ophthalmology) Jerrell Belfast, MD as Consulting Physician (Otolaryngology)  Extended Emergency Contact Information Primary Emergency Contact: [place] Phone: [phone] Mobile Phone: [phone] Relation: Daughter Secondary Emergency Contact: Senters,[name] Address: [address], [place] [address] [place] Phone: [phone] Relation: Spouse  Code Status: DNR Goals of care:  Advanced Directive information Advanced Directives 08/09/2017  Does Patient Have a Medical Advance Directive? Yes  Type of Paramedic of [place];Living will;Out of facility DNR (pink MOST or yellow form)  Does patient want to make changes to medical advance directive? No - Patient declined  Copy of [place] in Chart? Yes  Would patient like information on creating a medical advance directive? -  Pre-existing out of facility DNR order (yellow form or pink MOST form) Yellow form placed in chart (order not valid for inpatient use)     Allergies  Allergen Reactions  . Amlodipine Swelling and Other (See Comments)    Reaction:  Lower extremity swelling     Chief Complaint  Patient presents with  . Discharge Note    HPI:  84 y.o. female seen for [facility] from [facility] [place]. She  was admitted on 08/08/17 due to left hip/groin pain after a fall with increased need for assistance. It was noted that she hit her head. She was seen in the ER and an xray of the left hip showed no fracture and CT of the head showed no bleed. She continued with severe left hip pain and left foot dragging. A CT of the pelvis was ordered which showed no acute fracture but osseous demineralization with DDD in the lumbar spine and increased stool in the colon. Also noted was marked right hydronephrosis with decompressed ureter c/w UPJ obstruction, as well as a cystocele. A BMP was obtained which showed no acute kidney issues. UA was ordered which showed no infection. She is currently asymptomatic in regards to the hydronephrosis. She has been using ultram and tylenol for pain in the left hip and working with therapy and she reports decreased pain and increased mobility and she is requesting to go home. For constipation she is now on senokot and reports regular BMs and a normal diet. Therapy reports she is ambulating without assistance and is no longer dragging her leg. During her stay she developed epistaxis and was sent to the ER 08/10/17 and was given tranexamic acid injection and afrin with pressure and the nasal bleed subsided.     Past Medical History:  Diagnosis Date  . Abnormal stress test    a. 11/2011 Ex MV: EF 80%, small, partially reversible anteroapical defect->mild ischemia vs attenuation-->med Rx.  [name] Kitchen Alopecia, unspecified   . Arthritis   . Chronic diastolic CHF (congestive heart failure) ([place])    a. 11/2013 Echo: EF 60-65%, no rwma, mild TR,  PASP 63mmHg.  . Closed fracture of lower end of radius with ulna   . Deafness    Left, s/p multiple surgeries  . Essential hypertension   . Gouty arthropathy, unspecified   . History of kidney stones   . Hypothyroidism   . Insomnia, unspecified   . Macular degeneration    Left, s/p inj. tx  . Mitral valve disorders(424.0)   . Neck mass    right, w/u  including MRI negative  . PAF (paroxysmal atrial fibrillation) (HCC)    a. on amio (02/2012 mild obstruction on PFT's) and xarelto.  . Polycythemia vera(238.4)   . Pure hypercholesterolemia   . Senile osteoporosis    Reclast in the past  . Tricuspid valve disorders, specified as nonrheumatic     Past Surgical History:  Procedure Laterality Date  . BREAST BIOPSY  06/18/1998   left  . CARDIOVERSION N/A 07/15/2017   Procedure: CARDIOVERSION;  Surgeon: Larey Dresser, MD;  Location: [facility] ENDOSCOPY;  Service: Cardiovascular;  Laterality: N/A;  . ELECTROPHYSIOLOGIC STUDY N/A 01/08/2016   Procedure: Atrial Fibrillation Ablation;  Surgeon: Thompson Grayer, MD;  Location: [facility] CV LAB;  Service: Cardiovascular;  Laterality: N/A;  . FEMUR IM NAIL Right 10/08/2015   Procedure: INTRAMEDULLARY (IM) NAIL FEMORAL RIGHT;  Surgeon: Paralee Cancel, MD;  Location: [facility] ORS;  Service: Orthopedics;  Laterality: Right;  . MASS EXCISION Right 11/26/2016   Procedure: EXCISION RIGHT LATERAL NECK MASS;  Surgeon: Jerrell Belfast, MD;  Location: [facility];  Service: ENT;  Laterality: Right;  . TEE WITHOUT CARDIOVERSION N/A 07/15/2017   Procedure: TRANSESOPHAGEAL ECHOCARDIOGRAM (TEE);  Surgeon: Larey Dresser, MD;  Location: [facility] ENDOSCOPY;  Service: Cardiovascular;  Laterality: N/A;  . TONSILLECTOMY    . TOTAL ABDOMINAL HYSTERECTOMY    . TYMPANOPLASTY Bilateral       reports that she quit smoking about 33 years ago. Her smoking use included cigarettes. She has never used smokeless tobacco. She reports that she drinks about 0.6 oz of alcohol per week. She reports that she does not use drugs. Social History   Socioeconomic History  . Marital status: Married    Spouse name: [name]  . Number of children: 2  . Years of education: 83  . Highest education level: Not on file  Occupational History  . Not on file  Social Needs  . Financial resource strain: Not on file  . Food insecurity:    Worry: Not on file    Inability:  Not on file  . Transportation needs:    Medical: Not on file    Non-medical: Not on file  Tobacco Use  . Smoking status: Former Smoker    Types: Cigarettes    Last attempt to quit: 01/19/1984    Years since quitting: 33.6  . Smokeless tobacco: Never Used  Substance and Sexual Activity  . Alcohol use: Yes    Alcohol/week: 0.6 oz    Types: 1 Glasses of wine per week    Comment: 2 per day/ 14 a week  . Drug use: No  . Sexual activity: Not Currently  Lifestyle  . Physical activity:    Days per week: Not on file    Minutes per session: Not on file  . Stress: Not on file  Relationships  . Social connections:    Talks on phone: Not on file    Gets together: Not on file    Attends religious service: Not on file    Active member of club or organization:  Not on file    Attends meetings of clubs or organizations: Not on file    Relationship status: Not on file  . Intimate partner violence:    Fear of current or ex partner: Not on file    Emotionally abused: Not on file    Physically abused: Not on file    Forced sexual activity: Not on file  Other Topics Concern  . Not on file  Social History Narrative   Patient is Married 1955. 2 kids. 18 grandkid-63 years old in 2016. College graduate [name]. Of [place]).    Lives in apartment,  Independent Living  section at [place] since 02/2013.      Stay at home mother.       No Smoking history, Mod. alcohol use.   Patient has a living will, POA      Hobbies: CPU and painting         Functional Status Survey:    Allergies  Allergen Reactions  . Amlodipine Swelling and Other (See Comments)    Reaction:  Lower extremity swelling     Pertinent  Health Maintenance Due  Topic Date Due  . INFLUENZA VACCINE  09/08/2017  . DEXA SCAN  Completed  . PNA vac Low Risk Adult  Completed    Medications: Outpatient Encounter Medications as of 08/22/2017  Medication Sig  . acetaminophen (TYLENOL) 325 MG tablet Take 2  tablets (650 mg total) by mouth every 4 (four) hours as needed for mild pain or moderate pain. Not to exceed 3g in 24h  . Cholecalciferol (VITAMIN D) 2000 units CAPS Take 2,000 Units by mouth daily.   . fentaNYL (DURAGESIC - DOSED MCG/HR) 25 MCG/HR patch Place 1 patch (25 mcg total) onto the skin every 3 (three) days.  . furosemide (LASIX) 40 MG tablet Take 2 tablets (80 mg total) by mouth daily.  [name] Kitchen gabapentin (NEURONTIN) 100 MG capsule Take 100 mg by mouth at bedtime.  . hydrALAZINE (APRESOLINE) 50 MG tablet TAKE 1 1/2 TABLETS THREE TIMES DAILY.  . hydroxyurea (HYDREA) 500 MG capsule 500 mg on Mondays, Wednesdays and Fridays and 1000 mg the other days of the week by mouth  . levothyroxine (SYNTHROID, LEVOTHROID) 100 MCG tablet Take 1 tablet (100 mcg total) by mouth daily before breakfast.  . Magnesium 250 MG TABS Take 250 mg by mouth daily.   . Melatonin 10 MG TABS Take 10 mg by mouth at bedtime.  . methocarbamol (ROBAXIN) 500 MG tablet Take 1 tablet (500 mg total) by mouth 2 (two) times daily as needed for muscle spasms.  . Multiple Vitamins-Minerals (PRESERVISION AREDS 2) CAPS Take 1 capsule by mouth 2 (two) times daily.   . potassium chloride (K-DUR) 10 MEQ tablet TAKE 1 TABLET BY MOUTH TWICE DAILY.  [name] Kitchen senna-docusate (SENOKOT-S) 8.6-50 MG tablet Take 2 tablets by mouth 2 (two) times daily.  . sodium chloride (OCEAN) 0.65 % SOLN nasal spray Place 2 sprays into both nostrils as needed for congestion.  . traMADol (ULTRAM) 50 MG tablet Take 50 mg by mouth 3 (three) times daily as needed.  Alveda Reasons 15 MG TABS tablet TAKE 1 TABLET ONCE DAILY.  . [DISCONTINUED] docusate sodium (COLACE) 50 MG capsule Take 100 mg by mouth 2 (two) times daily.  . [DISCONTINUED] polycarbophil (FIBERCON) 625 MG tablet Take 2 tablets (1,250 mg total) by mouth 2 (two) times daily.   No facility-administered encounter medications on file as of 08/22/2017.     Review of Systems  Constitutional:  Negative for activity  change, appetite change, chills, diaphoresis, fatigue, fever and unexpected weight change.  HENT: Negative for congestion.   Respiratory: Negative for cough, shortness of breath and wheezing.   Cardiovascular: Negative for chest pain, palpitations and leg swelling.  Gastrointestinal: Negative for abdominal distention, abdominal pain, constipation and diarrhea.  Genitourinary: Negative for difficulty urinating and dysuria.  Musculoskeletal: Positive for arthralgias, back pain and gait problem. Negative for joint swelling and myalgias.  Neurological: Negative for dizziness, tremors, seizures, syncope, facial asymmetry, speech difficulty, weakness, light-headedness, numbness and headaches.  Psychiatric/Behavioral: Negative for agitation, behavioral problems and confusion.    Vitals:   08/22/17 1132  BP: 121/66  Pulse: 82  Weight: 102 lb 12.8 oz (46.6 kg)   Body mass index is 20.08 kg/m. Physical Exam  Constitutional: She is oriented to person, place, and time. No distress.  HENT:  Head: Normocephalic and atraumatic.  Neck: No JVD present.  Cardiovascular: Normal rate.  No murmur heard. Irregular, no edema  Pulmonary/Chest: Effort normal and breath sounds normal. No respiratory distress. She has no wheezes.  Abdominal: Soft. Bowel sounds are normal.  No bladder tenderness or CVA tenderness  Musculoskeletal:  MAE, strength 5/5 to BLE  Neurological: She is alert and oriented to person, place, and time.  Skin: Skin is warm and dry. She is not diaphoretic.  Psychiatric: She has a normal mood and affect.  Nursing note and vitals reviewed.   Labs reviewed: Basic Metabolic Panel: Recent Labs    02/07/17 2305  07/07/17 1528 07/26/17 0954 07/28/17 08/10/17  NA 133*   < > 138 140 141 136*  K 3.8   < > 3.8 4.2 5.0 4.5  CL 97*  --  102 102  --   --   CO2 27  --  30 32  --   --   GLUCOSE 117*  --  88 98  --   --   BUN 26*   < > 16 37* 38* 20  CREATININE 0.80   < > 0.82 0.83 0.7 0.5    CALCIUM 10.7*  --  10.7* 10.9*  --   --    < > = values in this interval not displayed.   Liver Function Tests: Recent Labs    09/14/16 1428 02/07/17 2305 07/26/17 0954 07/28/17 0800  AST 17 21 24 23   ALT 18 16 30 30   ALKPHOS 62 59 47 62  BILITOT 1.00 0.9 0.5  --   PROT 7.0 6.6 5.5*  --   ALBUMIN 4.6 4.7 3.8  --    No results for input(s): LIPASE, AMYLASE in the last 8760 hours. No results for input(s): AMMONIA in the last 8760 hours. CBC: Recent Labs    05/10/17 1406  07/26/17 0954 07/28/17 07/29/17 1305 08/10/17 0135  WBC 8.1   < > 11.8* 15.3 13.2* 9.4  NEUTROABS 6.4  --  10.0*  --  11.0*  --   HGB 13.5   < > 11.6* 13.4 12.4 12.1  HCT 42.2   < > 36.4 41 38.2 36.8  MCV 107.3*   < > 112.3*  --  113.4* 112.5*  PLT 454*   < > 523* 595* 543* 358   < > = values in this interval not displayed.   Cardiac Enzymes: No results for input(s): CKTOTAL, CKMB, CKMBINDEX, TROPONINI in the last 8760 hours. BNP: Invalid input(s): POCBNP CBG: No results for input(s): GLUCAP in the last 8760 hours.  Procedures and Imaging Studies During Stay: Ct  Head Wo Contrast  Result Date: 08/08/2017 CLINICAL DATA:  Mechanical fall. Hit left side of head. Complains of right-sided neck pain. On anticoagulation. EXAM: CT HEAD WITHOUT CONTRAST CT CERVICAL SPINE WITHOUT CONTRAST TECHNIQUE: Multidetector CT imaging of the head and cervical spine was performed following the standard protocol without intravenous contrast. Multiplanar CT image reconstructions of the cervical spine were also generated. COMPARISON:  CT cervical spine dated July 28, 2017. CT head dated Jun 16, 2015. FINDINGS: CT HEAD FINDINGS Brain: No evidence of acute infarction, hemorrhage, hydrocephalus, extra-axial collection or mass lesion/mass effect. Stable cerebral atrophy. Vascular: Atherosclerotic vascular calcification of the carotid siphons. No hyperdense vessel. Skull: Negative for fracture. Innumerable tiny lucent lesions throughout  the skull are unchanged since 2017 and likely due to osteopenia. Sinuses/Orbits: No acute finding. Unchanged partial opacification of the right mastoid air cells. Small focal deformity along the left medial orbital wall is unchanged and remains consistent with prior trauma. Other: None. CT CERVICAL SPINE FINDINGS Alignment: Unchanged trace anterolisthesis at C2-C3 and 4 mm anterolisthesis at C7-T1. No traumatic malalignment. Straightening of the normal cervical lordosis. Skull base and vertebrae: No acute fracture. No primary bone lesion. Prominent calcified pannus about the dens with unchanged narrowing of the spinal canal and mass effect on the upper cervical spinal cord. Soft tissues and spinal canal: No prevertebral fluid or swelling. No visible canal hematoma. Disc levels: Moderate to severe degenerative disc disease and moderate facet uncovertebral hypertrophy throughout the cervical spine, similar to prior study. Upper chest: Scarring in the left lung apex, unchanged. Other: Bilateral carotid artery calcific atherosclerosis. IMPRESSION: 1.  No acute intracranial abnormality. 2.  No acute cervical spine fracture. 3. Prominent calcified pannus about the dens with unchanged narrowing of the spinal canal and mass effect on the upper cervical spinal cord, likely secondary to CPPD. 4. Moderate to severe degenerative changes of the cervical spine are similar to prior study. Electronically Signed   By: Titus Dubin M.D.   On: 08/08/2017 15:25   Ct Chest Wo Contrast  Result Date: 07/28/2017 CLINICAL DATA:  Chest pain. EXAM: CT CHEST WITHOUT CONTRAST TECHNIQUE: Multidetector CT imaging of the chest was performed following the standard protocol without IV contrast. COMPARISON:  Radiographs of July 22, 2017. CT scan of January 05, 2016. FINDINGS: Cardiovascular: 4.1 cm ascending thoracic aortic aneurysm is noted. Mild cardiomegaly is noted. No pericardial effusion is noted. Mediastinum/Nodes: Thyroid gland and  esophagus are unremarkable. Calcified left hilar adenopathy is noted most consistent with granulomatous disease. Lungs/Pleura: No pneumothorax or pleural effusion is noted. Mild biapical scarring is noted. Mild right basilar subsegmental atelectasis or scarring is noted. Stable scarring is noted in the right upper lobe. No acute abnormality is noted. Upper Abdomen: Calcified splenic granulomata are noted. Left renal cysts are noted. Musculoskeletal: No chest wall mass or suspicious bone lesions identified. IMPRESSION: 4.1 cm ascending thoracic aortic aneurysm. Recommend annual imaging followup by CTA or MRA. This recommendation follows 2010 ACCF/AHA/AATS/ACR/ASA/SCA/SCAI/SIR/STS/SVM Guidelines for the Diagnosis and Management of Patients with Thoracic Aortic Disease. Circulation. 2010; 121: G315-V761. Calcified left hilar adenopathy is noted most consistent with chronic granulomatous disease or histoplasmosis. Stable scarring is seen in right upper lobe. No acute pulmonary parenchymal abnormality is noted. Electronically Signed   By: Marijo Conception, M.D.   On: 07/28/2017 16:07   Ct Cervical Spine Wo Contrast  Result Date: 08/08/2017 CLINICAL DATA:  Mechanical fall. Hit left side of head. Complains of right-sided neck pain. On anticoagulation. EXAM: CT HEAD WITHOUT CONTRAST  CT CERVICAL SPINE WITHOUT CONTRAST TECHNIQUE: Multidetector CT imaging of the head and cervical spine was performed following the standard protocol without intravenous contrast. Multiplanar CT image reconstructions of the cervical spine were also generated. COMPARISON:  CT cervical spine dated July 28, 2017. CT head dated Jun 16, 2015. FINDINGS: CT HEAD FINDINGS Brain: No evidence of acute infarction, hemorrhage, hydrocephalus, extra-axial collection or mass lesion/mass effect. Stable cerebral atrophy. Vascular: Atherosclerotic vascular calcification of the carotid siphons. No hyperdense vessel. Skull: Negative for fracture. Innumerable tiny  lucent lesions throughout the skull are unchanged since 2017 and likely due to osteopenia. Sinuses/Orbits: No acute finding. Unchanged partial opacification of the right mastoid air cells. Small focal deformity along the left medial orbital wall is unchanged and remains consistent with prior trauma. Other: None. CT CERVICAL SPINE FINDINGS Alignment: Unchanged trace anterolisthesis at C2-C3 and 4 mm anterolisthesis at C7-T1. No traumatic malalignment. Straightening of the normal cervical lordosis. Skull base and vertebrae: No acute fracture. No primary bone lesion. Prominent calcified pannus about the dens with unchanged narrowing of the spinal canal and mass effect on the upper cervical spinal cord. Soft tissues and spinal canal: No prevertebral fluid or swelling. No visible canal hematoma. Disc levels: Moderate to severe degenerative disc disease and moderate facet uncovertebral hypertrophy throughout the cervical spine, similar to prior study. Upper chest: Scarring in the left lung apex, unchanged. Other: Bilateral carotid artery calcific atherosclerosis. IMPRESSION: 1.  No acute intracranial abnormality. 2.  No acute cervical spine fracture. 3. Prominent calcified pannus about the dens with unchanged narrowing of the spinal canal and mass effect on the upper cervical spinal cord, likely secondary to CPPD. 4. Moderate to severe degenerative changes of the cervical spine are similar to prior study. Electronically Signed   By: Titus Dubin M.D.   On: 08/08/2017 15:25   Ct Cervical Spine Wo Contrast  Result Date: 07/28/2017 CLINICAL DATA:  Chronic right shoulder pain. Pain began 1 month ago. Radiculopathy. EXAM: CT CERVICAL SPINE WITHOUT CONTRAST TECHNIQUE: Multidetector CT imaging of the cervical spine was performed without intravenous contrast. Multiplanar CT image reconstructions were also generated. COMPARISON:  CT neck with contrast 10/26/2016 FINDINGS: Alignment: Grade 1 anterolisthesis is present at  C7-T1. There is slight anterolisthesis at C2-3. AP alignment is otherwise anatomic. There straightening of the normal cervical lordosis. Skull base and vertebrae: The craniocervical junction demonstrates calcification of soft tissues about the dens. There is no erosion the dens. This calcification does narrow the spinal canal and impacts the upper cervical spinal cord. Soft tissues and spinal canal: Atherosclerotic calcifications are present at the carotid bifurcations bilaterally. No focal mass lesion present. Previously noted fat containing lesion along the posterior right sternocleidomastoid muscle has been resected. Disc levels: C2-3: Asymmetric left-sided facet hypertrophy is present. There is no significant stenosis. C3-4: Asymmetric right-sided uncovertebral and facet disease is present. Mild right foraminal narrowing is present. Central canal is patent. C4-5: Uncovertebral and facet disease is present bilaterally. This results in moderate bilateral foraminal stenosis. C5-6: Uncovertebral and facet disease is present bilaterally. Moderate bilateral foraminal narrowing is present. Central canal is patent. C6-7: A broad-based disc osteophyte complex present. Uncovertebral spurring results in moderate right and mild left foraminal stenosis. C7-T1: Asymmetric right-sided facet hypertrophy and spurring is present. There is no focal disc protrusion or stenosis. Upper chest: Posterior scarring is present on the left. The lung apices are otherwise clear. Thoracic inlet is within normal limits. IMPRESSION: 1. Uncovertebral spurring and facet hypertrophy result in moderate foraminal stenosis  bilaterally at C4-5 and C5-6. 2. Moderate right and mild left foraminal narrowing at C6-7. 3. Mild right foraminal narrowing at C3-4. 4. Asymmetric right-sided facet hypertrophy at C7-T1 without focal stenosis. 5. Atherosclerotic changes at the carotid bifurcations bilaterally. 6. Prominent calcification of soft tissue ligaments  about the dens results in moderate central canal stenosis with distortion of the ventral surface of the spinal cord. Electronically Signed   By: San Morelle M.D.   On: 07/28/2017 16:12   Ct Pelvis Wo Contrast  Result Date: 08/09/2017 CLINICAL DATA:  Tenderness of LEFT greater trochanter EXAM: CT PELVIS WITHOUT CONTRAST TECHNIQUE: Multidetector CT imaging of the pelvis was performed following the standard protocol without intravenous contrast. Sagittal and coronal MPR images reconstructed from axial data set. COMPARISON:  None FINDINGS: Urinary Tract: Markedly dilated RIGHT renal collecting system with decompressed ureter. No ureteral calcifications or dilatation. Bladder decompressed though demonstrating low descent question cystocele. Bowel: Small-bowel loops unremarkable. Appendix not definitely visualized. Increased stool in colon. Vascular/Lymphatic: Atherosclerotic calcification aorta and iliac arteries. Multiple calcified retroperitoneal phleboliths. Reproductive: Uterus surgically absent. Nonvisualization of ovaries. Other: No free air or free fluid. No hernia or inflammatory process. Musculoskeletal: Diffuse osseous demineralization. Degenerative disc and facet disease changes at visualized lower lumbar spine with levoconvex scoliosis and orthopedic hardware proximal RIGHT femur. Degenerative changes of pubic symphysis. No fracture or bone destruction. Old muscular planes are atrophic but otherwise symmetric and unremarkable. IMPRESSION: Osseous demineralization with degenerative disc and facet disease changes at visualized lower lumbar spine. No acute osseous abnormalities. Increased stool in colon. Marked RIGHT hydronephrosis with decompressed ureter consistent with UPJ obstruction. Cystocele. Electronically Signed   By: Lavonia Dana M.D.   On: 08/09/2017 18:40   Dg Hip Unilat W Or Wo Pelvis 2-3 Views Left  Result Date: 08/08/2017 CLINICAL DATA:  Acute LEFT hip pain following fall today.  Initial encounter. EXAM: DG HIP (WITH OR WITHOUT PELVIS) 2-3V LEFT COMPARISON:  10/05/2015 FINDINGS: No acute fracture, subluxation or dislocation. No suspicious focal bony lesions are present. ORIF proximal RIGHT femur noted. Degenerative changes in the visualized lumbar spine again noted. IMPRESSION: No acute LEFT hip abnormality. Electronically Signed   By: Margarette Canada M.D.   On: 08/08/2017 14:55    Assessment/Plan:   1. Left hip pain Significantly improved per resident due to fall and hx of DDD and osseous demineralization of the bone Continue prn tylenol and ultram May have robaxin 500 mg BID prn #20 given  2. Hydronephrosis with ureteropelvic junction (UPJ) obstruction Discussed with Dr. Mariea Clonts no referral needed at this time. Went over CT result with resident and she remains asymptomatic. She was educated on the s/s of UTI.  3. Paroxysmal atrial fibrillation (HCC) Rate controlled Continues on xarelto for CVA risk reduction  4. Chronic diastolic CHF (congestive heart failure) (HCC) Compensated Continue Lasix 80 mg qd, Kdur 10 meq BID  5. Essential hypertension Controlled, continue apresoline 50 mg 1.5 tab TID  6. Constipation due to pain medication Improved Continue senokot s 2 bid, may titrate as needed for 1 BM per day  7. DDD (degenerative disc disease), cervical She reports this is much improved and she cancelled her epidural apt Continues on fentanyl patch    Patient is being discharged with the following home health services:  PT and OT  Patient is being discharged with the following durable medical equipment:    Patient has been advised to f/u with their PCP in 1-2 weeks to for a transitions of care visit.  Social services at their facility was responsible for arranging this appointment.  Pt was provided with adequate prescriptions of noncontrolled medications to reach the scheduled appointment .  For controlled substances, a limited supply was provided as appropriate  for the individual patient.  If the pt normally receives these medications from a pain clinic or has a contract with another physician, these medications should be received from that clinic or physician only).    Future labs/tests needed:  NA

## 2017-08-23 DIAGNOSIS — M62561 Muscle wasting and atrophy, not elsewhere classified, right lower leg: Secondary | ICD-10-CM | POA: Diagnosis not present

## 2017-08-23 DIAGNOSIS — H353222 Exudative age-related macular degeneration, left eye, with inactive choroidal neovascularization: Secondary | ICD-10-CM | POA: Diagnosis not present

## 2017-08-23 DIAGNOSIS — M545 Low back pain: Secondary | ICD-10-CM | POA: Diagnosis not present

## 2017-08-23 DIAGNOSIS — R278 Other lack of coordination: Secondary | ICD-10-CM | POA: Diagnosis not present

## 2017-08-23 DIAGNOSIS — H353112 Nonexudative age-related macular degeneration, right eye, intermediate dry stage: Secondary | ICD-10-CM | POA: Diagnosis not present

## 2017-08-23 DIAGNOSIS — H353211 Exudative age-related macular degeneration, right eye, with active choroidal neovascularization: Secondary | ICD-10-CM | POA: Diagnosis not present

## 2017-08-23 DIAGNOSIS — M6389 Disorders of muscle in diseases classified elsewhere, multiple sites: Secondary | ICD-10-CM | POA: Diagnosis not present

## 2017-08-23 DIAGNOSIS — H353124 Nonexudative age-related macular degeneration, left eye, advanced atrophic with subfoveal involvement: Secondary | ICD-10-CM | POA: Diagnosis not present

## 2017-08-23 DIAGNOSIS — M25552 Pain in left hip: Secondary | ICD-10-CM | POA: Diagnosis not present

## 2017-08-23 DIAGNOSIS — M62552 Muscle wasting and atrophy, not elsewhere classified, left thigh: Secondary | ICD-10-CM | POA: Diagnosis not present

## 2017-08-24 DIAGNOSIS — M62561 Muscle wasting and atrophy, not elsewhere classified, right lower leg: Secondary | ICD-10-CM | POA: Diagnosis not present

## 2017-08-24 DIAGNOSIS — R278 Other lack of coordination: Secondary | ICD-10-CM | POA: Diagnosis not present

## 2017-08-24 DIAGNOSIS — M6389 Disorders of muscle in diseases classified elsewhere, multiple sites: Secondary | ICD-10-CM | POA: Diagnosis not present

## 2017-08-24 DIAGNOSIS — M62552 Muscle wasting and atrophy, not elsewhere classified, left thigh: Secondary | ICD-10-CM | POA: Diagnosis not present

## 2017-08-24 DIAGNOSIS — M545 Low back pain: Secondary | ICD-10-CM | POA: Diagnosis not present

## 2017-08-24 DIAGNOSIS — M25552 Pain in left hip: Secondary | ICD-10-CM | POA: Diagnosis not present

## 2017-08-27 ENCOUNTER — Encounter (HOSPITAL_COMMUNITY): Payer: Self-pay | Admitting: Cardiology

## 2017-08-29 MED ORDER — APIXABAN 2.5 MG PO TABS: 2.5000 mg | ORAL_TABLET | Freq: Two times a day (BID) | ORAL | 6 refills | Status: DC

## 2017-08-29 NOTE — Telephone Encounter (Signed)
Per Dr Aundra Dubin will need to switch from Xarelto to Eliquis, new script scent into pharm (correct eliquis dose reviewed with Bonnita Nasuti, Pharm D) Patient will need 2.5 mg BID,

## 2017-08-30 DIAGNOSIS — M62561 Muscle wasting and atrophy, not elsewhere classified, right lower leg: Secondary | ICD-10-CM | POA: Diagnosis not present

## 2017-08-30 DIAGNOSIS — R278 Other lack of coordination: Secondary | ICD-10-CM | POA: Diagnosis not present

## 2017-08-30 DIAGNOSIS — M62552 Muscle wasting and atrophy, not elsewhere classified, left thigh: Secondary | ICD-10-CM | POA: Diagnosis not present

## 2017-08-30 DIAGNOSIS — M25552 Pain in left hip: Secondary | ICD-10-CM | POA: Diagnosis not present

## 2017-08-30 DIAGNOSIS — M545 Low back pain: Secondary | ICD-10-CM | POA: Diagnosis not present

## 2017-08-30 DIAGNOSIS — M6389 Disorders of muscle in diseases classified elsewhere, multiple sites: Secondary | ICD-10-CM | POA: Diagnosis not present

## 2017-08-31 DIAGNOSIS — M62552 Muscle wasting and atrophy, not elsewhere classified, left thigh: Secondary | ICD-10-CM | POA: Diagnosis not present

## 2017-08-31 DIAGNOSIS — R278 Other lack of coordination: Secondary | ICD-10-CM | POA: Diagnosis not present

## 2017-08-31 DIAGNOSIS — M545 Low back pain: Secondary | ICD-10-CM | POA: Diagnosis not present

## 2017-08-31 DIAGNOSIS — M6389 Disorders of muscle in diseases classified elsewhere, multiple sites: Secondary | ICD-10-CM | POA: Diagnosis not present

## 2017-08-31 DIAGNOSIS — M25552 Pain in left hip: Secondary | ICD-10-CM | POA: Diagnosis not present

## 2017-08-31 DIAGNOSIS — M62561 Muscle wasting and atrophy, not elsewhere classified, right lower leg: Secondary | ICD-10-CM | POA: Diagnosis not present

## 2017-09-01 ENCOUNTER — Other Ambulatory Visit: Payer: Medicare Other

## 2017-09-01 DIAGNOSIS — M62552 Muscle wasting and atrophy, not elsewhere classified, left thigh: Secondary | ICD-10-CM | POA: Diagnosis not present

## 2017-09-01 DIAGNOSIS — M545 Low back pain: Secondary | ICD-10-CM | POA: Diagnosis not present

## 2017-09-01 DIAGNOSIS — M6389 Disorders of muscle in diseases classified elsewhere, multiple sites: Secondary | ICD-10-CM | POA: Diagnosis not present

## 2017-09-01 DIAGNOSIS — R278 Other lack of coordination: Secondary | ICD-10-CM | POA: Diagnosis not present

## 2017-09-01 DIAGNOSIS — M62561 Muscle wasting and atrophy, not elsewhere classified, right lower leg: Secondary | ICD-10-CM | POA: Diagnosis not present

## 2017-09-01 DIAGNOSIS — M25552 Pain in left hip: Secondary | ICD-10-CM | POA: Diagnosis not present

## 2017-09-02 DIAGNOSIS — M62552 Muscle wasting and atrophy, not elsewhere classified, left thigh: Secondary | ICD-10-CM | POA: Diagnosis not present

## 2017-09-02 DIAGNOSIS — R278 Other lack of coordination: Secondary | ICD-10-CM | POA: Diagnosis not present

## 2017-09-02 DIAGNOSIS — M25552 Pain in left hip: Secondary | ICD-10-CM | POA: Diagnosis not present

## 2017-09-02 DIAGNOSIS — M545 Low back pain: Secondary | ICD-10-CM | POA: Diagnosis not present

## 2017-09-02 DIAGNOSIS — M6389 Disorders of muscle in diseases classified elsewhere, multiple sites: Secondary | ICD-10-CM | POA: Diagnosis not present

## 2017-09-02 DIAGNOSIS — M62561 Muscle wasting and atrophy, not elsewhere classified, right lower leg: Secondary | ICD-10-CM | POA: Diagnosis not present

## 2017-09-05 DIAGNOSIS — M47816 Spondylosis without myelopathy or radiculopathy, lumbar region: Secondary | ICD-10-CM | POA: Diagnosis not present

## 2017-09-06 DIAGNOSIS — M6389 Disorders of muscle in diseases classified elsewhere, multiple sites: Secondary | ICD-10-CM | POA: Diagnosis not present

## 2017-09-06 DIAGNOSIS — M25552 Pain in left hip: Secondary | ICD-10-CM | POA: Diagnosis not present

## 2017-09-06 DIAGNOSIS — R278 Other lack of coordination: Secondary | ICD-10-CM | POA: Diagnosis not present

## 2017-09-06 DIAGNOSIS — M62552 Muscle wasting and atrophy, not elsewhere classified, left thigh: Secondary | ICD-10-CM | POA: Diagnosis not present

## 2017-09-06 DIAGNOSIS — M545 Low back pain: Secondary | ICD-10-CM | POA: Diagnosis not present

## 2017-09-06 DIAGNOSIS — M62561 Muscle wasting and atrophy, not elsewhere classified, right lower leg: Secondary | ICD-10-CM | POA: Diagnosis not present

## 2017-09-07 ENCOUNTER — Encounter: Payer: Self-pay | Admitting: Internal Medicine

## 2017-09-07 ENCOUNTER — Non-Acute Institutional Stay: Payer: Medicare Other | Admitting: Internal Medicine

## 2017-09-07 VITALS — BP 112/70 | HR 68 | Temp 98.4°F | Wt 105.0 lb

## 2017-09-07 DIAGNOSIS — M5030 Other cervical disc degeneration, unspecified cervical region: Secondary | ICD-10-CM | POA: Diagnosis not present

## 2017-09-07 DIAGNOSIS — I5032 Chronic diastolic (congestive) heart failure: Secondary | ICD-10-CM | POA: Diagnosis not present

## 2017-09-07 DIAGNOSIS — Q6211 Congenital occlusion of ureteropelvic junction: Secondary | ICD-10-CM

## 2017-09-07 DIAGNOSIS — M810 Age-related osteoporosis without current pathological fracture: Secondary | ICD-10-CM

## 2017-09-07 DIAGNOSIS — I480 Paroxysmal atrial fibrillation: Secondary | ICD-10-CM

## 2017-09-07 DIAGNOSIS — R1030 Lower abdominal pain, unspecified: Secondary | ICD-10-CM

## 2017-09-07 DIAGNOSIS — R1032 Left lower quadrant pain: Secondary | ICD-10-CM

## 2017-09-07 NOTE — Progress Notes (Signed)
Location:  [facility] clinic Provider:  Nyonna Hargrove L. Mariea Clonts, D.O., C.M.D.  Code Status: DNR Goals of Care:  Advanced Directives 09/07/2017  Does Patient Have a Medical Advance Directive? Yes  Type of Paramedic of [place];Living will;Out of facility DNR (pink MOST or yellow form)  Does patient want to make changes to medical advance directive? No - Patient declined  Copy of [place] in Chart? Yes  Would patient like information on creating a medical advance directive? -  Pre-existing out of facility DNR order (yellow form or pink MOST form) Yellow form placed in chart (order not valid for inpatient use)     Chief Complaint  Patient presents with  . Medical Management of Chronic Issues    follow-up from rehab    HPI: Patient is a 84 y.o. female seen today for medical management of chronic diseases.    She is now able to lift her leg on her own.  It's still sore in her groin.  Knows she had that fall.  No more falls.  The rug is out of her kitchen.    Back is not great b/c ablation was not effective.  Had cortisone injection, but next not due until end of august.  Is using salonpas which is helping.  NP gave her a muscle relaxer prescription.  Neither knock it out but they help.  It doesn't make her loopy.  Says things tend to work opposite for her and things that help sleep woke her up.    Still not noticing any arrhythmia concerns.  She's on eliquis in hopes for less epistaxis.    Hydronephrosis:  No symptoms, no bladder emptying issues, no related pain.    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    Her macular degeneration is worsening and she has a f/u appt.   Past Medical History:  Diagnosis Date  . Abnormal stress test    a. 11/2011 Ex MV: EF 80%, small, partially reversible anteroapical defect->mild ischemia vs attenuation-->med Rx.  [name] Kitchen Alopecia, unspecified   . Arthritis   . Chronic diastolic CHF (congestive heart  failure) ([place])    a. 11/2013 Echo: EF 60-65%, no rwma, mild TR, PASP 60mmHg.  . Closed fracture of lower end of radius with ulna   . Deafness    Left, s/p multiple surgeries  . Essential hypertension   . Gouty arthropathy, unspecified   . History of kidney stones   . Hypothyroidism   . Insomnia, unspecified   . Macular degeneration    Left, s/p inj. tx  . Mitral valve disorders(424.0)   . Neck mass    right, w/u including MRI negative  . PAF (paroxysmal atrial fibrillation) (HCC)    a. on amio (02/2012 mild obstruction on PFT's) and xarelto.  . Polycythemia vera(238.4)   . Pure hypercholesterolemia   . Senile osteoporosis    Reclast in the past  . Tricuspid valve disorders, specified as nonrheumatic     Past Surgical History:  Procedure Laterality Date  . BREAST BIOPSY  06/18/1998   left  . CARDIOVERSION N/A 07/15/2017   Procedure: CARDIOVERSION;  Surgeon: Larey Dresser, MD;  Location: [facility] ENDOSCOPY;  Service: Cardiovascular;  Laterality: N/A;  . ELECTROPHYSIOLOGIC STUDY N/A 01/08/2016   Procedure: Atrial Fibrillation Ablation;  Surgeon: Thompson Grayer, MD;  Location: [facility] CV LAB;  Service: Cardiovascular;  Laterality: N/A;  . FEMUR IM NAIL Right 10/08/2015   Procedure: INTRAMEDULLARY (IM) NAIL FEMORAL  RIGHT;  Surgeon: Paralee Cancel, MD;  Location: [facility] ORS;  Service: Orthopedics;  Laterality: Right;  . MASS EXCISION Right 11/26/2016   Procedure: EXCISION RIGHT LATERAL NECK MASS;  Surgeon: Jerrell Belfast, MD;  Location: [facility];  Service: ENT;  Laterality: Right;  . TEE WITHOUT CARDIOVERSION N/A 07/15/2017   Procedure: TRANSESOPHAGEAL ECHOCARDIOGRAM (TEE);  Surgeon: Larey Dresser, MD;  Location: [facility] ENDOSCOPY;  Service: Cardiovascular;  Laterality: N/A;  . TONSILLECTOMY    . TOTAL ABDOMINAL HYSTERECTOMY    . TYMPANOPLASTY Bilateral     Allergies  Allergen Reactions  . Amlodipine Swelling and Other (See Comments)    Reaction:  Lower extremity swelling     Outpatient  Encounter Medications as of 09/07/2017  Medication Sig  . acetaminophen (TYLENOL) 325 MG tablet Take 2 tablets (650 mg total) by mouth every 4 (four) hours as needed for mild pain or moderate pain. Not to exceed 3g in 24h  . apixaban (ELIQUIS) 2.5 MG TABS tablet Take 1 tablet (2.5 mg total) by mouth 2 (two) times daily.  . Cholecalciferol (VITAMIN D) 2000 units CAPS Take 2,000 Units by mouth daily.   . fentaNYL (DURAGESIC - DOSED MCG/HR) 25 MCG/HR patch Place 1 patch (25 mcg total) onto the skin every 3 (three) days.  . furosemide (LASIX) 40 MG tablet Take 2 tablets (80 mg total) by mouth daily.  [name] Kitchen gabapentin (NEURONTIN) 100 MG capsule Take 100 mg by mouth at bedtime.  . hydrALAZINE (APRESOLINE) 50 MG tablet TAKE 1 1/2 TABLETS THREE TIMES DAILY.  . hydroxyurea (HYDREA) 500 MG capsule 500 mg on Mondays, Wednesdays and Fridays and 1000 mg the other days of the week by mouth  . levothyroxine (SYNTHROID, LEVOTHROID) 100 MCG tablet Take 1 tablet (100 mcg total) by mouth daily before breakfast.  . Magnesium 250 MG TABS Take 250 mg by mouth daily.   . Melatonin 10 MG TABS Take 10 mg by mouth at bedtime.  . methocarbamol (ROBAXIN) 500 MG tablet Take 1 tablet (500 mg total) by mouth 2 (two) times daily as needed for muscle spasms.  . Multiple Vitamins-Minerals (PRESERVISION AREDS 2) CAPS Take 1 capsule by mouth 2 (two) times daily.   . potassium chloride (K-DUR) 10 MEQ tablet TAKE 1 TABLET BY MOUTH TWICE DAILY.  [name] Kitchen senna-docusate (SENOKOT-S) 8.6-50 MG tablet Take 2 tablets by mouth 2 (two) times daily.  . sodium chloride (OCEAN) 0.65 % SOLN nasal spray Place 2 sprays into both nostrils as needed for congestion.  . traMADol (ULTRAM) 50 MG tablet Take 50 mg by mouth 3 (three) times daily as needed.   No facility-administered encounter medications on file as of 09/07/2017.     Review of Systems:  Review of Systems  Constitutional: Negative for chills, diaphoresis, fever and malaise/fatigue.  HENT:  Positive for hearing loss. Negative for congestion.   Eyes: Negative for blurred vision.  Respiratory: Negative for cough and shortness of breath.   Cardiovascular: Negative for chest pain, palpitations and leg swelling.  Gastrointestinal: Negative for abdominal pain, blood in stool, constipation and melena.  Genitourinary: Negative for dysuria.  Musculoskeletal: Positive for back pain, joint pain and myalgias. Negative for falls.       No more falls since rehab; pain in left side of lower back and left groin persist but less severe  Skin: Negative for itching and rash.  Neurological: Negative for dizziness and loss of consciousness.  Psychiatric/Behavioral: Negative for depression and memory loss. The patient is not nervous/anxious and does not have  insomnia.     Health Maintenance  Topic Date Due  . INFLUENZA VACCINE  09/08/2017  . TETANUS/TDAP  01/13/2027  . DEXA SCAN  Completed  . PNA vac Low Risk Adult  Completed    Physical Exam: Vitals:   09/07/17 1524  BP: 112/70  Pulse: 68  Temp: 98.4 F (36.9 C)  TempSrc: Oral  SpO2: 96%  Weight: 105 lb (47.6 kg)   Body mass index is 20.51 kg/m. Physical Exam  Constitutional: She is oriented to person, place, and time. She appears well-developed and well-nourished. No distress.  HENT:  HOH  Cardiovascular: Normal rate, regular rhythm, normal heart sounds and intact distal pulses.  Pulmonary/Chest: Effort normal and breath sounds normal. No respiratory distress.  Musculoskeletal: Normal range of motion.  Neurological: She is alert and oriented to person, place, and time.  Skin: Skin is warm and dry.  Psychiatric: She has a normal mood and affect.    Labs reviewed: Basic Metabolic Panel: Recent Labs    12/27/16 1427 02/07/17 2305  07/07/17 1528 07/26/17 0954 07/28/17 08/10/17  NA  --  133*   < > 138 140 141 136*  K  --  3.8   < > 3.8 4.2 5.0 4.5  CL  --  97*  --  102 102  --   --   CO2  --  27  --  30 32  --   --     GLUCOSE  --  117*  --  88 98  --   --   BUN  --  26*   < > 16 37* 38* 20  CREATININE  --  0.80   < > 0.82 0.83 0.7 0.5  CALCIUM  --  10.7*  --  10.7* 10.9*  --   --   TSH 0.194*  --   --   --   --  1.37  --    < > = values in this interval not displayed.   Liver Function Tests: Recent Labs    09/14/16 1428 02/07/17 2305 07/26/17 0954 07/28/17 0800  AST 17 21 24 23   ALT 18 16 30 30   ALKPHOS 62 59 47 62  BILITOT 1.00 0.9 0.5  --   PROT 7.0 6.6 5.5*  --   ALBUMIN 4.6 4.7 3.8  --    No results for input(s): LIPASE, AMYLASE in the last 8760 hours. No results for input(s): AMMONIA in the last 8760 hours. CBC: Recent Labs    05/10/17 1406  07/26/17 0954 07/28/17 07/29/17 1305 08/10/17 0135  WBC 8.1   < > 11.8* 15.3 13.2* 9.4  NEUTROABS 6.4  --  10.0*  --  11.0*  --   HGB 13.5   < > 11.6* 13.4 12.4 12.1  HCT 42.2   < > 36.4 41 38.2 36.8  MCV 107.3*   < > 112.3*  --  113.4* 112.5*  PLT 454*   < > 523* 595* 543* 358   < > = values in this interval not displayed.   Lipid Panel: Recent Labs    07/28/17  CHOL 194  HDL 72*  LDLCALC 102  TRIG 99   No results found for: HGBA1C  Procedures since last visit: Ct Pelvis Wo Contrast  Result Date: 08/09/2017 CLINICAL DATA:  Tenderness of LEFT greater trochanter EXAM: CT PELVIS WITHOUT CONTRAST TECHNIQUE: Multidetector CT imaging of the pelvis was performed following the standard protocol without intravenous contrast. Sagittal and coronal MPR images reconstructed from axial  data set. COMPARISON:  None FINDINGS: Urinary Tract: Markedly dilated RIGHT renal collecting system with decompressed ureter. No ureteral calcifications or dilatation. Bladder decompressed though demonstrating low descent question cystocele. Bowel: Small-bowel loops unremarkable. Appendix not definitely visualized. Increased stool in colon. Vascular/Lymphatic: Atherosclerotic calcification aorta and iliac arteries. Multiple calcified retroperitoneal phleboliths.  Reproductive: Uterus surgically absent. Nonvisualization of ovaries. Other: No free air or free fluid. No hernia or inflammatory process. Musculoskeletal: Diffuse osseous demineralization. Degenerative disc and facet disease changes at visualized lower lumbar spine with levoconvex scoliosis and orthopedic hardware proximal RIGHT femur. Degenerative changes of pubic symphysis. No fracture or bone destruction. Old muscular planes are atrophic but otherwise symmetric and unremarkable. IMPRESSION: Osseous demineralization with degenerative disc and facet disease changes at visualized lower lumbar spine. No acute osseous abnormalities. Increased stool in colon. Marked RIGHT hydronephrosis with decompressed ureter consistent with UPJ obstruction. Cystocele. Electronically Signed   By: Lavonia Dana M.D.   On: 08/09/2017 18:40    Assessment/Plan 1. Severe left groin pain -improved after therapy  2. Hydronephrosis with ureteropelvic junction (UPJ) obstruction -unknown if this was new or not, asymptomatic and renal function was normal, urine output was normal  3. Paroxysmal atrial fibrillation (HCC) -cont eliquis, has been in sinus since last cardioversion  4. Chronic diastolic CHF (congestive heart failure) (HCC) -stable, asymptomatic, cont daily lasix  5. DDD (degenerative disc disease), cervical -ongoing, also in lumbar spine, gets injections, ablations, sometimes effective, other times not -using fentanyl, tramadol, robaxin, tylenol, salonpas  6. Senile osteoporosis -cont prolia and vitamin D therapy -next injection due in Jan  Labs/tests ordered:  No new Next appt:  11/30/2017--keep as scheduled   Jayquon Theiler L. Yakir Wenke, D.O. [facility] [address], [place] [address] Cell Phone (Mon-Fri 8am-5pm):  [phone] On Call:  [phone] & follow prompts after 5pm & weekends Office Phone:  [phone] Office Fax:  [phone]

## 2017-09-08 DIAGNOSIS — H353112 Nonexudative age-related macular degeneration, right eye, intermediate dry stage: Secondary | ICD-10-CM | POA: Diagnosis not present

## 2017-09-08 DIAGNOSIS — H353124 Nonexudative age-related macular degeneration, left eye, advanced atrophic with subfoveal involvement: Secondary | ICD-10-CM | POA: Diagnosis not present

## 2017-09-08 DIAGNOSIS — H353211 Exudative age-related macular degeneration, right eye, with active choroidal neovascularization: Secondary | ICD-10-CM | POA: Diagnosis not present

## 2017-09-08 DIAGNOSIS — H353222 Exudative age-related macular degeneration, left eye, with inactive choroidal neovascularization: Secondary | ICD-10-CM | POA: Diagnosis not present

## 2017-09-12 ENCOUNTER — Other Ambulatory Visit: Payer: Self-pay | Admitting: *Deleted

## 2017-09-12 MED ORDER — FENTANYL 25 MCG/HR TD PT72
25.0000 ug | MEDICATED_PATCH | TRANSDERMAL | 0 refills | Status: DC
Start: 2017-09-12 — End: 2017-10-11

## 2017-09-12 NOTE — Telephone Encounter (Signed)
Patient requested refill [place] Database Verified LR: 07/11/2017 Pharmacy Confirmed Pended Rx and sent to Dr. Mariea Clonts for approval.

## 2017-09-13 ENCOUNTER — Other Ambulatory Visit (HOSPITAL_COMMUNITY): Payer: Self-pay | Admitting: Cardiology

## 2017-09-13 ENCOUNTER — Other Ambulatory Visit: Payer: Self-pay

## 2017-09-13 ENCOUNTER — Encounter: Payer: Self-pay | Admitting: Family Medicine

## 2017-09-13 DIAGNOSIS — R2689 Other abnormalities of gait and mobility: Secondary | ICD-10-CM | POA: Diagnosis not present

## 2017-09-13 DIAGNOSIS — Z9181 History of falling: Secondary | ICD-10-CM | POA: Diagnosis not present

## 2017-09-13 DIAGNOSIS — M62552 Muscle wasting and atrophy, not elsewhere classified, left thigh: Secondary | ICD-10-CM | POA: Diagnosis not present

## 2017-09-13 DIAGNOSIS — M810 Age-related osteoporosis without current pathological fracture: Secondary | ICD-10-CM | POA: Diagnosis not present

## 2017-09-13 DIAGNOSIS — M25552 Pain in left hip: Secondary | ICD-10-CM | POA: Diagnosis not present

## 2017-09-13 DIAGNOSIS — M545 Low back pain: Secondary | ICD-10-CM | POA: Diagnosis not present

## 2017-09-13 DIAGNOSIS — M549 Dorsalgia, unspecified: Secondary | ICD-10-CM

## 2017-09-13 DIAGNOSIS — M5442 Lumbago with sciatica, left side: Secondary | ICD-10-CM | POA: Diagnosis not present

## 2017-09-13 DIAGNOSIS — M62561 Muscle wasting and atrophy, not elsewhere classified, right lower leg: Secondary | ICD-10-CM | POA: Diagnosis not present

## 2017-09-13 DIAGNOSIS — M62562 Muscle wasting and atrophy, not elsewhere classified, left lower leg: Secondary | ICD-10-CM | POA: Diagnosis not present

## 2017-09-13 DIAGNOSIS — G8929 Other chronic pain: Secondary | ICD-10-CM | POA: Diagnosis not present

## 2017-09-13 DIAGNOSIS — R278 Other lack of coordination: Secondary | ICD-10-CM | POA: Diagnosis not present

## 2017-09-21 ENCOUNTER — Other Ambulatory Visit: Payer: Self-pay

## 2017-09-21 ENCOUNTER — Ambulatory Visit: Payer: Medicare Other | Attending: Family Medicine

## 2017-09-21 DIAGNOSIS — R252 Cramp and spasm: Secondary | ICD-10-CM | POA: Insufficient documentation

## 2017-09-21 DIAGNOSIS — M6281 Muscle weakness (generalized): Secondary | ICD-10-CM | POA: Diagnosis not present

## 2017-09-21 DIAGNOSIS — G8929 Other chronic pain: Secondary | ICD-10-CM | POA: Insufficient documentation

## 2017-09-21 DIAGNOSIS — M545 Low back pain: Secondary | ICD-10-CM | POA: Diagnosis not present

## 2017-09-21 NOTE — Therapy (Signed)
[facility] [address], [place], [place], [address] Phone: [phone]   Fax:  [phone]  Physical Therapy Evaluation  Patient Details  Name: Sarah Mays MRN: [id number] Date of Birth: [birth year] Referring Provider: Hulan Saas, MD   Encounter Date: 09/21/2080  PT End of Session - 09/21/17 1003    Visit Number  1    Date for PT Re-Evaluation  11/16/17    Authorization Type  Medicare/BCBS    PT Start Time  0914    PT Stop Time  1004    PT Time Calculation (min)  50 min    Activity Tolerance  Patient tolerated treatment well    Behavior During Therapy  [facility] for tasks assessed/performed       Past Medical History:  Diagnosis Date  . Abnormal stress test    a. 11/2011 Ex MV: EF 80%, small, partially reversible anteroapical defect->mild ischemia vs attenuation-->med Rx.  [name] Kitchen Alopecia, unspecified   . Arthritis   . Chronic diastolic CHF (congestive heart failure) ([place])    a. 11/2013 Echo: EF 60-65%, no rwma, mild TR, PASP 81mmHg.  . Closed fracture of lower end of radius with ulna   . Deafness    Left, s/p multiple surgeries  . Essential hypertension   . Gouty arthropathy, unspecified   . History of kidney stones   . Hypothyroidism   . Insomnia, unspecified   . Macular degeneration    Left, s/p inj. tx  . Mitral valve disorders(424.0)   . Neck mass    right, w/u including MRI negative  . PAF (paroxysmal atrial fibrillation) (HCC)    a. on amio (02/2012 mild obstruction on PFT's) and xarelto.  . Polycythemia vera(238.4)   . Pure hypercholesterolemia   . Senile osteoporosis    Reclast in the past  . Tricuspid valve disorders, specified as nonrheumatic     Past Surgical History:  Procedure Laterality Date  . BREAST BIOPSY  06/18/1998   left  . CARDIOVERSION N/A 07/15/2017   Procedure: CARDIOVERSION;  Surgeon: Larey Dresser, MD;  Location: [facility] ENDOSCOPY;  Service: Cardiovascular;  Laterality: N/A;   . ELECTROPHYSIOLOGIC STUDY N/A 01/08/2016   Procedure: Atrial Fibrillation Ablation;  Surgeon: Thompson Grayer, MD;  Location: [place] CV LAB;  Service: Cardiovascular;  Laterality: N/A;  . FEMUR IM NAIL Right 10/08/2015   Procedure: INTRAMEDULLARY (IM) NAIL FEMORAL RIGHT;  Surgeon: Paralee Cancel, MD;  Location: [facility] ORS;  Service: Orthopedics;  Laterality: Right;  . MASS EXCISION Right 11/26/2016   Procedure: EXCISION RIGHT LATERAL NECK MASS;  Surgeon: Jerrell Belfast, MD;  Location: [place];  Service: ENT;  Laterality: Right;  . TEE WITHOUT CARDIOVERSION N/A 07/15/2017   Procedure: TRANSESOPHAGEAL ECHOCARDIOGRAM (TEE);  Surgeon: Larey Dresser, MD;  Location: [facility] ENDOSCOPY;  Service: Cardiovascular;  Laterality: N/A;  . TONSILLECTOMY    . TOTAL ABDOMINAL HYSTERECTOMY    . TYMPANOPLASTY Bilateral     There were no vitals filed for this visit.   Subjective Assessment - 09/21/17 0916    Subjective  Pt presents to PT with 3 year history of LBP.  Pt denies any PT for this.  Pt had one fall 08/08/17 and did PT for balance at [facility]-just concluded last week.      Pertinent History  1 fall- 08/08/17.  Osteoporosis    Limitations  Sitting    How long can you sit comfortably?  without back support    Diagnostic tests  none  Patient Stated Goals  reduce LBP    Currently in Pain?  Yes    Pain Score  0-No pain   0-6/10   Pain Location  Back    Pain Orientation  Lower;Left    Pain Descriptors / Indicators  Aching    Pain Type  Chronic pain    Pain Onset  More than a month ago    Pain Frequency  Intermittent    Aggravating Factors   sitting without back support, late in the day    Pain Relieving Factors  Tramadol at night, Salanpas patch         [facility] PT Assessment - 09/21/17 0001      Assessment   Medical Diagnosis  back pain, unspecified back location    Referring Provider  Hulan Saas, MD    Onset Date/Surgical Date  09/22/14    Next MD Visit  none    Prior Therapy  for balance.   Not for back pain      Precautions   Precautions  Fall    Precaution Comments  1 fall 08/08/17- pt has had balance training      Balance Screen   Has the patient fallen in the past 6 months  Yes    How many times?  1   Pt has received balance treatment at [facility]   Has the patient had a decrease in activity level because of a fear of falling?   Yes    Is the patient reluctant to leave their home because of a fear of falling?   No      Home Environment   Living Environment  Assisted living    [place] - 2 wheels;Grab bars - toilet;Grab bars - tub/shower    Additional Comments  Independent living at [facility]      Prior Function   Level of Independence  Independent;Requires assistive device for independence    Vocation  Retired    Leisure  crafts      Cognition   Overall Cognitive Status  Within Functional Limits for tasks assessed      Observation/Other Assessments   Focus on Therapeutic Outcomes (FOTO)   44% limitation      Posture/Postural Control   Posture/Postural Control  Postural limitations    Postural Limitations  Forward head;Rounded Shoulders;Flexed trunk      ROM / Strength   AROM / PROM / Strength  AROM;PROM;Strength      AROM   Overall AROM   Deficits    Overall AROM Comments  lumbar A/ROM is limited by 50% in all directions with limited segmental movement with A/ROM.  No pain reported.       PROM   Overall PROM   Deficits    Overall PROM Comments  hamstring flexibility limited by 50% bilaterally, Hip IR/ER limited by 25% bilaterally      Strength   Overall Strength  Deficits    Strength Assessment Site  Knee;Hip;Ankle    Right/Left Hip  Right;Left    Right Hip Flexion  4/5    Right Hip Extension  4/5    Right Hip ABduction  4/5    Left Hip Flexion  4/5    Left Hip Extension  4/5    Left Hip ABduction  4/5    Right/Left Knee  Right;Left    Right Knee Flexion  4+/5    Right Knee Extension  4+/5    Left Knee Flexion  4+/5    Left  Knee Extension  4+/5    Right/Left Ankle  Right;Left    Right Ankle Dorsiflexion  4+/5    Left Ankle Dorsiflexion  4+/5      Palpation   Spinal mobility  spinal mobility limited  by 75% in thoracic and lumbar spine.  Pain reported over L3-5    Palpation comment  marked palpable tenderness over bil proximal gluteals with trigger points and over bil lumbar paraspinals.        Ambulation/Gait   Ambulation/Gait  Yes    Assistive device  4-wheeled walker    Gait Pattern  Step-through pattern;Decreased stride length;Decreased trunk rotation;Right genu recurvatum;Left genu recurvatum                Objective measurements completed on examination: See above findings.              PT Education - 09/21/17 1028    Education Details  Access Code: [email or web address]    Person(s) Educated  Patient    Methods  Explanation;Demonstration;Handout    Comprehension  Verbalized understanding;Returned demonstration       PT Short Term Goals - 09/21/17 1010      PT SHORT TERM GOAL #1   Title  be independent in initial HEP    Time  4    Period  Weeks    Status  New    Target Date  10/19/17      PT SHORT TERM GOAL #2   Title  report a 25% reduction in LBP at the end of the day    Time  4    Period  Weeks    Status  New    Target Date  10/19/17      PT SHORT TERM GOAL #3   Title  verbalize and demonstrate understanding of fall prevention and osteoporosis precautions/mechanics modifications for osteoporosis    Time  4    Period  Weeks    Status  New    Target Date  10/19/17        PT Long Term Goals - 09/21/17 1011      PT LONG TERM GOAL #1   Title  independent with advanced HEP to include exercises for osteoporosis    Time  8    Period  Weeks    Status  New    Target Date  11/16/17      PT LONG TERM GOAL #2   Title  report a 50% reduction in LBP at the end of the day    Time  8    Period  Weeks    Status  New    Target Date  11/16/17      PT LONG TERM GOAL #3    Title  reduce FOTO to < or = to 36% limitation    Time  8    Period  Weeks    Status  New    Target Date  11/16/17      PT LONG TERM GOAL #4   Title  --             Plan - 09/21/17 1016    Clinical Impression Statement  Pt is an 84 y.o. female who presents to PT with chronic LBP.  Pt has had 1 fall 08/08/17 resulting in Lt hip pain. Pt was just discharged from balance training PT at [place].  Pt demonstrates significant limitation in lumbar segmental mobility, reduced hip and knee strength and improper mechanics with rolling in bed and  sit to stand.  Pt has osteoporosis and will benefit from education regarding exercises for strength and alignment.  Pt with gait abnormality with rolling walker.  Pt with trigger points and tension in bil lumbar paraspinals and proximal gluteals.  Pt with fair TA contraction.  Pt will benefit from skilled PT for education in osteoporosis, body mechanics, advancement of HEP for strength and core and manual/dry needling to gluteals and lumbar spine.      History and Personal Factors relevant to plan of care:  osteoporosis    Clinical Presentation  Stable    Clinical Decision Making  Low    Rehab Potential  Good    PT Frequency  2x / week    PT Duration  8 weeks    PT Treatment/Interventions  ADLs/Self Care Home Management;Cryotherapy;Occupational psychologist;Therapeutic exercise;Therapeutic activities;Neuromuscular re-education;Manual techniques;Passive range of motion;Dry needling;Taping    PT Next Visit Plan  dry needling to gluteals and bil lumbar paraspinals, scapular stabilzation with ab bracing, osteo education, kinesiotape    Consulted and Agree with Plan of Care  Patient       Patient will benefit from skilled therapeutic intervention in order to improve the following deficits and impairments:  Pain, Improper body mechanics, Increased muscle spasms, Postural dysfunction, Decreased strength, Decreased range of motion,  Impaired flexibility  Visit Diagnosis: Muscle weakness (generalized) - Plan: PT plan of care cert/re-cert  Cramp and spasm - Plan: PT plan of care cert/re-cert  Chronic bilateral low back pain without sciatica - Plan: PT plan of care cert/re-cert     Problem List Patient Active Problem List   Diagnosis Date Noted  . Hydronephrosis with ureteropelvic junction (UPJ) obstruction 08/22/2017  . DDD (degenerative disc disease), cervical 08/09/2017  . Hypercoagulable state due to atrial fibrillation ([place]) 08/09/2017  . Presbycusis of both ears 08/03/2017  . Deficiency anemia 07/26/2017  . Recurrent epistaxis 02/25/2017  . Iron deficiency anemia 02/21/2017  . Pure hypercholesterolemia   . PONV (postoperative nausea and vomiting)   . PAF (paroxysmal atrial fibrillation) ([place])   . Neck mass   . Insomnia, unspecified   . History of kidney stones   . Deafness   . Closed fracture of lower end of radius with ulna   . Arthritis   . Abnormal stress test   . Rotator cuff arthropathy, right 09/08/2016  . Trigger point of right shoulder region 05/05/2016  . Spondylosis of cervical region without myelopathy or radiculopathy 05/05/2016  . Chondrocalcinosis 05/05/2016  . Chronic pain disorder 05/05/2016  . A-fib ([place]) 01/08/2016  . Closed fracture of shaft of right femur, initial encounter ([place]) 10/25/2015  . Status post-operative repair of hip fracture 10/25/2015  . Greater trochanteric bursitis of right hip 09/29/2015  . Asymptomatic cholelithiasis 05/09/2015  . Essential hypertension 11/07/2014  . Hair loss 11/07/2014  . Bilateral leg edema 11/07/2014  . Constipation due to pain medication 11/06/2014  . Hypotension due to drugs 11/06/2014  . Chest pain 09/01/2014  . Osteoporosis 01/04/2014  . Gout 01/04/2014  . Headache 01/04/2014  . Hypothyroidism 11/19/2013  . Hypercalcemia 10/11/2013  . Insomnia 05/16/2013  . Lumbar back pain 05/16/2013  . Polycythemia vera ([place])   . Mass of  right side of neck   . Senile osteoporosis   . Macular degeneration   . Sick sinus syndrome ([place]) 03/09/2012  . CAD (coronary artery disease) 12/27/2011  . Paroxysmal atrial fibrillation ([place]) 12/27/2011  . Chronic diastolic CHF (congestive heart failure) ([place]) 12/27/2011  . HTN (hypertension)  12/27/2011   Sigurd Sos, PT 09/21/17 10:31 AM  [facility] [address] [place], [place], [address] Phone: [phone]   Fax:  [phone]  Name: KAILYNNE FERRINGTON MRN: [id number] Date of Birth: [birth year]

## 2017-09-21 NOTE — Patient Instructions (Addendum)
Access Code: [email or web address]  URL: [email or web address]  Date: 09/21/2017  Prepared by: Sigurd Sos   Exercises  Seated Transversus Abdominis Bracing - 10 reps - 5 hold - 4x daily - 7x weekly  Supine Transversus Abdominis Bracing - Hands on Stomach - 10 reps - 5 hold - 4x daily - 7x weekly  Seated Hamstring Stretch - 3 reps - 20 hold - 3x daily - 7x weekly  Clamshell - 10 reps - 2 sets - 2x daily - 7x weekly

## 2017-09-29 ENCOUNTER — Ambulatory Visit: Payer: Medicare Other

## 2017-09-29 DIAGNOSIS — G8929 Other chronic pain: Secondary | ICD-10-CM

## 2017-09-29 DIAGNOSIS — M6281 Muscle weakness (generalized): Secondary | ICD-10-CM | POA: Diagnosis not present

## 2017-09-29 DIAGNOSIS — R252 Cramp and spasm: Secondary | ICD-10-CM

## 2017-09-29 DIAGNOSIS — M545 Low back pain, unspecified: Secondary | ICD-10-CM

## 2017-09-29 NOTE — Therapy (Signed)
[facility] [address] [place], [place], [address] Phone: [phone]   Fax:  [phone]  Physical Therapy Treatment  Patient Details  Name: Sarah Mays MRN: [id number] Date of Birth: [birth year] Referring Provider: Hulan Saas, MD   Encounter Date: 09/29/2017  PT End of Session - 09/29/17 1118    Visit Number  2    Date for PT Re-Evaluation  11/16/17    Authorization Type  Medicare/BCBS    PT Start Time  1016    PT Stop Time  1112    PT Time Calculation (min)  56 min    Activity Tolerance  Patient tolerated treatment well    Behavior During Therapy  [facility] for tasks assessed/performed       Past Medical History:  Diagnosis Date  . Abnormal stress test    a. 11/2011 Ex MV: EF 80%, small, partially reversible anteroapical defect->mild ischemia vs attenuation-->med Rx.  [name] Kitchen Alopecia, unspecified   . Arthritis   . Chronic diastolic CHF (congestive heart failure) ([place])    a. 11/2013 Echo: EF 60-65%, no rwma, mild TR, PASP 3mmHg.  . Closed fracture of lower end of radius with ulna   . Deafness    Left, s/p multiple surgeries  . Essential hypertension   . Gouty arthropathy, unspecified   . History of kidney stones   . Hypothyroidism   . Insomnia, unspecified   . Macular degeneration    Left, s/p inj. tx  . Mitral valve disorders(424.0)   . Neck mass    right, w/u including MRI negative  . PAF (paroxysmal atrial fibrillation) (HCC)    a. on amio (02/2012 mild obstruction on PFT's) and xarelto.  . Polycythemia vera(238.4)   . Pure hypercholesterolemia   . Senile osteoporosis    Reclast in the past  . Tricuspid valve disorders, specified as nonrheumatic     Past Surgical History:  Procedure Laterality Date  . BREAST BIOPSY  06/18/1998   left  . CARDIOVERSION N/A 07/15/2017   Procedure: CARDIOVERSION;  Surgeon: Larey Dresser, MD;  Location: [facility] ENDOSCOPY;  Service: Cardiovascular;  Laterality: N/A;   . ELECTROPHYSIOLOGIC STUDY N/A 01/08/2016   Procedure: Atrial Fibrillation Ablation;  Surgeon: Thompson Grayer, MD;  Location: [place] CV LAB;  Service: Cardiovascular;  Laterality: N/A;  . FEMUR IM NAIL Right 10/08/2015   Procedure: INTRAMEDULLARY (IM) NAIL FEMORAL RIGHT;  Surgeon: Paralee Cancel, MD;  Location: [facility] ORS;  Service: Orthopedics;  Laterality: Right;  . MASS EXCISION Right 11/26/2016   Procedure: EXCISION RIGHT LATERAL NECK MASS;  Surgeon: Jerrell Belfast, MD;  Location: [place];  Service: ENT;  Laterality: Right;  . TEE WITHOUT CARDIOVERSION N/A 07/15/2017   Procedure: TRANSESOPHAGEAL ECHOCARDIOGRAM (TEE);  Surgeon: Larey Dresser, MD;  Location: [facility] ENDOSCOPY;  Service: Cardiovascular;  Laterality: N/A;  . TONSILLECTOMY    . TOTAL ABDOMINAL HYSTERECTOMY    . TYMPANOPLASTY Bilateral     There were no vitals filed for this visit.  Subjective Assessment - 09/29/17 1015    Subjective  I'm doing OK.    Patient Stated Goals  reduce LBP    Currently in Pain?  Yes    Pain Score  3     Pain Location  Back    Pain Orientation  Lower;Left    Pain Descriptors / Indicators  Aching    Pain Type  Chronic pain    Pain Onset  More than a month ago    Pain Frequency  Intermittent    Aggravating Factors   as the day progresses, sitting without back support    Pain Relieving Factors  Salanpas patch, Tramadol at night                       [facility] Adult PT Treatment/Exercise - 09/29/17 0001      Exercises   Exercises  Lumbar      Modalities   Modalities  Moist Heat      Moist Heat Therapy   Number Minutes Moist Heat  10 Minutes    Moist Heat Location  Lumbar Spine      Manual Therapy   Manual Therapy  Soft tissue mobilization    Manual therapy comments  elongation and trigger point release to Lt gluteals and bil lumbar paraspinals       Trigger Point Dry Needling - 09/29/17 1023    Consent Given?  Yes    Education Handout Provided  Yes    Muscles Treated Lower  Body  Gluteus minimus;Gluteus maximus   bil lumbar multifidi, Lt gluteals   Gluteus Maximus Response  Twitch response elicited;Palpable increased muscle length    Gluteus Minimus Response  Twitch response elicited;Palpable increased muscle length           PT Education - 09/29/17 1114    Education Details  DN info    Person(s) Educated  Patient    Methods  Explanation;Demonstration;Handout    Comprehension  Verbalized understanding;Returned demonstration       PT Short Term Goals - 09/21/17 1010      PT SHORT TERM GOAL #1   Title  be independent in initial HEP    Time  4    Period  Weeks    Status  New    Target Date  10/19/17      PT SHORT TERM GOAL #2   Title  report a 25% reduction in LBP at the end of the day    Time  4    Period  Weeks    Status  New    Target Date  10/19/17      PT SHORT TERM GOAL #3   Title  verbalize and demonstrate understanding of fall prevention and osteoporosis precautions/mechanics modifications for osteoporosis    Time  4    Period  Weeks    Status  New    Target Date  10/19/17        PT Long Term Goals - 09/21/17 1011      PT LONG TERM GOAL #1   Title  independent with advanced HEP to include exercises for osteoporosis    Time  8    Period  Weeks    Status  New    Target Date  11/16/17      PT LONG TERM GOAL #2   Title  report a 50% reduction in LBP at the end of the day    Time  8    Period  Weeks    Status  New    Target Date  11/16/17      PT LONG TERM GOAL #3   Title  reduce FOTO to < or = to 36% limitation    Time  8    Period  Weeks    Status  New    Target Date  11/16/17      PT LONG TERM GOAL #4   Title  --  Plan - 09/29/17 1116    Clinical Impression Statement  Pt with first time follow-up today and session focused on dry needling and manual therapy for Lt gluteals and bil lumbar paraspinals.  Pt with trigger points and tension in Lt glut med and demonstrated improved tissue mobility and  reduced sensitivity to palpation after dry needling today.  Pt reports moderate compliance with HEP and reports that she has been bracing her abdominals in standing and sitting.  Pt will continue to benefit from skilled PT for improved tissue mobility, core strength, flexibility and hip strength.      Rehab Potential  Good    PT Frequency  2x / week    PT Duration  8 weeks    PT Treatment/Interventions  ADLs/Self Care Home Management;Cryotherapy;Occupational psychologist;Therapeutic exercise;Therapeutic activities;Neuromuscular re-education;Manual techniques;Passive range of motion;Dry needling;Taping    PT Next Visit Plan  assess response to dry needling to gluteals and bil lumbar paraspinals, scapular stabilzation with ab bracing, osteo education, kinesiotape    Recommended Other Services  initial certification is signed    Consulted and Agree with Plan of Care  Patient       Patient will benefit from skilled therapeutic intervention in order to improve the following deficits and impairments:  Pain, Improper body mechanics, Increased muscle spasms, Postural dysfunction, Decreased strength, Decreased range of motion, Impaired flexibility  Visit Diagnosis: Muscle weakness (generalized)  Cramp and spasm  Chronic bilateral low back pain without sciatica     Problem List Patient Active Problem List   Diagnosis Date Noted  . Hydronephrosis with ureteropelvic junction (UPJ) obstruction 08/22/2017  . DDD (degenerative disc disease), cervical 08/09/2017  . Hypercoagulable state due to atrial fibrillation ([place]) 08/09/2017  . Presbycusis of both ears 08/03/2017  . Deficiency anemia 07/26/2017  . Recurrent epistaxis 02/25/2017  . Iron deficiency anemia 02/21/2017  . Pure hypercholesterolemia   . PONV (postoperative nausea and vomiting)   . PAF (paroxysmal atrial fibrillation) ([place])   . Neck mass   . Insomnia, unspecified   . History of kidney stones   . Deafness    . Closed fracture of lower end of radius with ulna   . Arthritis   . Abnormal stress test   . Rotator cuff arthropathy, right 09/08/2016  . Trigger point of right shoulder region 05/05/2016  . Spondylosis of cervical region without myelopathy or radiculopathy 05/05/2016  . Chondrocalcinosis 05/05/2016  . Chronic pain disorder 05/05/2016  . A-fib ([place]) 01/08/2016  . Closed fracture of shaft of right femur, initial encounter ([place]) 10/25/2015  . Status post-operative repair of hip fracture 10/25/2015  . Greater trochanteric bursitis of right hip 09/29/2015  . Asymptomatic cholelithiasis 05/09/2015  . Essential hypertension 11/07/2014  . Hair loss 11/07/2014  . Bilateral leg edema 11/07/2014  . Constipation due to pain medication 11/06/2014  . Hypotension due to drugs 11/06/2014  . Chest pain 09/01/2014  . Osteoporosis 01/04/2014  . Gout 01/04/2014  . Headache 01/04/2014  . Hypothyroidism 11/19/2013  . Hypercalcemia 10/11/2013  . Insomnia 05/16/2013  . Lumbar back pain 05/16/2013  . Polycythemia vera ([place])   . Mass of right side of neck   . Senile osteoporosis   . Macular degeneration   . Sick sinus syndrome ([place]) 03/09/2012  . CAD (coronary artery disease) 12/27/2011  . Paroxysmal atrial fibrillation ([place]) 12/27/2011  . Chronic diastolic CHF (congestive heart failure) ([place]) 12/27/2011  . HTN (hypertension) 12/27/2011     Sigurd Sos, PT 09/29/17 11:20 AM  [facility] [address] [place], [place], [address] Phone: [phone]   Fax:  [phone]  Name: Sarah Mays MRN: [id number] Date of Birth: Dec 27, 1930

## 2017-09-29 NOTE — Patient Instructions (Signed)

## 2017-10-02 ENCOUNTER — Encounter (HOSPITAL_COMMUNITY): Payer: Self-pay

## 2017-10-02 ENCOUNTER — Encounter: Payer: Self-pay | Admitting: Internal Medicine

## 2017-10-03 ENCOUNTER — Ambulatory Visit: Payer: Medicare Other

## 2017-10-03 DIAGNOSIS — R252 Cramp and spasm: Secondary | ICD-10-CM | POA: Diagnosis not present

## 2017-10-03 DIAGNOSIS — G8929 Other chronic pain: Secondary | ICD-10-CM

## 2017-10-03 DIAGNOSIS — M545 Low back pain: Secondary | ICD-10-CM | POA: Diagnosis not present

## 2017-10-03 DIAGNOSIS — M6281 Muscle weakness (generalized): Secondary | ICD-10-CM

## 2017-10-03 NOTE — Therapy (Signed)
[facility] [address], [place], [place], [address] Phone: [phone]   Fax:  [phone]  Physical Therapy Treatment  Patient Details  Name: Sarah Mays MRN: [id number] Date of Birth: 07-17-1930 Referring Provider: Hulan Saas, MD   Encounter Date: 10/03/2017  PT End of Session - 10/03/17 1444    Visit Number  3    Date for PT Re-Evaluation  11/16/17    Authorization Type  Medicare/BCBS    PT Start Time  1402    PT Stop Time  1452    PT Time Calculation (min)  50 min    Activity Tolerance  Patient tolerated treatment well    Behavior During Therapy  [facility] for tasks assessed/performed       Past Medical History:  Diagnosis Date  . Abnormal stress test    a. 11/2011 Ex MV: EF 80%, small, partially reversible anteroapical defect->mild ischemia vs attenuation-->med Rx.  [name] Kitchen Alopecia, unspecified   . Arthritis   . Chronic diastolic CHF (congestive heart failure) ([place])    a. 11/2013 Echo: EF 60-65%, no rwma, mild TR, PASP 15mmHg.  . Closed fracture of lower end of radius with ulna   . Deafness    Left, s/p multiple surgeries  . Essential hypertension   . Gouty arthropathy, unspecified   . History of kidney stones   . Hypothyroidism   . Insomnia, unspecified   . Macular degeneration    Left, s/p inj. tx  . Mitral valve disorders(424.0)   . Neck mass    right, w/u including MRI negative  . PAF (paroxysmal atrial fibrillation) (HCC)    a. on amio (02/2012 mild obstruction on PFT's) and xarelto.  . Polycythemia vera(238.4)   . Pure hypercholesterolemia   . Senile osteoporosis    Reclast in the past  . Tricuspid valve disorders, specified as nonrheumatic     Past Surgical History:  Procedure Laterality Date  . BREAST BIOPSY  06/18/1998   left  . CARDIOVERSION N/A 07/15/2017   Procedure: CARDIOVERSION;  Surgeon: Larey Dresser, MD;  Location: [facility] ENDOSCOPY;  Service: Cardiovascular;  Laterality: N/A;   . ELECTROPHYSIOLOGIC STUDY N/A 01/08/2016   Procedure: Atrial Fibrillation Ablation;  Surgeon: Thompson Grayer, MD;  Location: [facility] CV LAB;  Service: Cardiovascular;  Laterality: N/A;  . FEMUR IM NAIL Right 10/08/2015   Procedure: INTRAMEDULLARY (IM) NAIL FEMORAL RIGHT;  Surgeon: Paralee Cancel, MD;  Location: [facility] ORS;  Service: Orthopedics;  Laterality: Right;  . MASS EXCISION Right 11/26/2016   Procedure: EXCISION RIGHT LATERAL NECK MASS;  Surgeon: Jerrell Belfast, MD;  Location: [facility];  Service: ENT;  Laterality: Right;  . TEE WITHOUT CARDIOVERSION N/A 07/15/2017   Procedure: TRANSESOPHAGEAL ECHOCARDIOGRAM (TEE);  Surgeon: Larey Dresser, MD;  Location: [facility] ENDOSCOPY;  Service: Cardiovascular;  Laterality: N/A;  . TONSILLECTOMY    . TOTAL ABDOMINAL HYSTERECTOMY    . TYMPANOPLASTY Bilateral     There were no vitals filed for this visit.  Subjective Assessment - 10/03/17 1407    Subjective  I had a bad couple of days over the weekend.  My Lt leg was hurting, no known cause. I can go on Thursday to get an injection.      Currently in Pain?  Yes    Pain Score  6     Pain Location  Back    Pain Orientation  Left;Lower    Pain Descriptors / Indicators  Aching    Pain Type  Chronic  pain    Pain Onset  More than a month ago    Pain Frequency  Intermittent    Aggravating Factors   as the day progresses, somtimes just random    Pain Relieving Factors  salanpas patch, Tramadol                       [facility] Adult PT Treatment/Exercise - 10/03/17 0001      Exercises   Exercises  Knee/Hip      Knee/Hip Exercises: Aerobic   Nustep  Level 1x 6 minutes   PT present to discuss progress     Modalities   Modalities  Moist Heat      Moist Heat Therapy   Number Minutes Moist Heat  10 Minutes    Moist Heat Location  Lumbar Spine      Manual Therapy   Manual Therapy  Soft tissue mobilization    Manual therapy comments  Lt gluteals-trigger point release       Trigger  Point Dry Needling - 10/03/17 1442    Consent Given?  Yes    Muscles Treated Lower Body  Gluteus minimus;Gluteus maximus;Piriformis   Lt only   Gluteus Maximus Response  Palpable increased muscle length;Twitch response elicited    Gluteus Minimus Response  Twitch response elicited;Palpable increased muscle length    Piriformis Response  Twitch response elicited;Palpable increased muscle length             PT Short Term Goals - 09/21/17 1010      PT SHORT TERM GOAL #1   Title  be independent in initial HEP    Time  4    Period  Weeks    Status  New    Target Date  10/19/17      PT SHORT TERM GOAL #2   Title  report a 25% reduction in LBP at the end of the day    Time  4    Period  Weeks    Status  New    Target Date  10/19/17      PT SHORT TERM GOAL #3   Title  verbalize and demonstrate understanding of fall prevention and osteoporosis precautions/mechanics modifications for osteoporosis    Time  4    Period  Weeks    Status  New    Target Date  10/19/17        PT Long Term Goals - 09/21/17 1011      PT LONG TERM GOAL #1   Title  independent with advanced HEP to include exercises for osteoporosis    Time  8    Period  Weeks    Status  New    Target Date  11/16/17      PT LONG TERM GOAL #2   Title  report a 50% reduction in LBP at the end of the day    Time  8    Period  Weeks    Status  New    Target Date  11/16/17      PT LONG TERM GOAL #3   Title  reduce FOTO to < or = to 36% limitation    Time  8    Period  Weeks    Status  New    Target Date  11/16/17      PT LONG TERM GOAL #4   Title  --            Plan - 10/03/17 1416  Clinical Impression Statement  Pt had a flare-up of pain over the past few days without cause.  Pt reports that she was not doing her exercises due to pain.  Pt with tension and trigger points in Lt gluteals and demonstrated improved tissue mobility and reduced pain after treatment today. PT encouraged pt to use NuStep  at [facility] and return to balance exercises that she was doing at PT therer.  Pt will return to doing her exercises as able at home.  Pt will continue to benefit from skilled PT for hip and core strength and flexibility, dry needling as needed.      Rehab Potential  Good    PT Frequency  2x / week    PT Duration  8 weeks    PT Treatment/Interventions  ADLs/Self Care Home Management;Cryotherapy;Occupational psychologist;Therapeutic exercise;Therapeutic activities;Neuromuscular re-education;Manual techniques;Passive range of motion;Dry needling;Taping    PT Next Visit Plan  assess response to dry needling to gluteals and bil lumbar paraspinals, scapular stabilzation with ab bracing, osteo education, kinesiotape    Consulted and Agree with Plan of Care  Patient       Patient will benefit from skilled therapeutic intervention in order to improve the following deficits and impairments:  Pain, Improper body mechanics, Increased muscle spasms, Postural dysfunction, Decreased strength, Decreased range of motion, Impaired flexibility  Visit Diagnosis: Muscle weakness (generalized)  Cramp and spasm  Chronic bilateral low back pain without sciatica     Problem List Patient Active Problem List   Diagnosis Date Noted  . Hydronephrosis with ureteropelvic junction (UPJ) obstruction 08/22/2017  . DDD (degenerative disc disease), cervical 08/09/2017  . Hypercoagulable state due to atrial fibrillation ([place]) 08/09/2017  . Presbycusis of both ears 08/03/2017  . Deficiency anemia 07/26/2017  . Recurrent epistaxis 02/25/2017  . Iron deficiency anemia 02/21/2017  . Pure hypercholesterolemia   . PONV (postoperative nausea and vomiting)   . PAF (paroxysmal atrial fibrillation) ([place])   . Neck mass   . Insomnia, unspecified   . History of kidney stones   . Deafness   . Closed fracture of lower end of radius with ulna   . Arthritis   . Abnormal stress test   . Rotator cuff  arthropathy, right 09/08/2016  . Trigger point of right shoulder region 05/05/2016  . Spondylosis of cervical region without myelopathy or radiculopathy 05/05/2016  . Chondrocalcinosis 05/05/2016  . Chronic pain disorder 05/05/2016  . A-fib ([place]) 01/08/2016  . Closed fracture of shaft of right femur, initial encounter ([place]) 10/25/2015  . Status post-operative repair of hip fracture 10/25/2015  . Greater trochanteric bursitis of right hip 09/29/2015  . Asymptomatic cholelithiasis 05/09/2015  . Essential hypertension 11/07/2014  . Hair loss 11/07/2014  . Bilateral leg edema 11/07/2014  . Constipation due to pain medication 11/06/2014  . Hypotension due to drugs 11/06/2014  . Chest pain 09/01/2014  . Osteoporosis 01/04/2014  . Gout 01/04/2014  . Headache 01/04/2014  . Hypothyroidism 11/19/2013  . Hypercalcemia 10/11/2013  . Insomnia 05/16/2013  . Lumbar back pain 05/16/2013  . Polycythemia vera ([place])   . Mass of right side of neck   . Senile osteoporosis   . Macular degeneration   . Sick sinus syndrome ([place]) 03/09/2012  . CAD (coronary artery disease) 12/27/2011  . Paroxysmal atrial fibrillation ([place]) 12/27/2011  . Chronic diastolic CHF (congestive heart failure) ([place]) 12/27/2011  . HTN (hypertension) 12/27/2011    Sigurd Sos, PT 10/03/17 2:45 PM  [facility] [address], [place], [place], [address] Phone: [phone]   Fax:  [phone]  Name: Sarah Mays MRN: [id number] Date of Birth: 03-26-1930

## 2017-10-04 DIAGNOSIS — H353211 Exudative age-related macular degeneration, right eye, with active choroidal neovascularization: Secondary | ICD-10-CM | POA: Diagnosis not present

## 2017-10-04 DIAGNOSIS — H353112 Nonexudative age-related macular degeneration, right eye, intermediate dry stage: Secondary | ICD-10-CM | POA: Diagnosis not present

## 2017-10-06 ENCOUNTER — Telehealth (HOSPITAL_COMMUNITY): Payer: Self-pay

## 2017-10-06 DIAGNOSIS — M47816 Spondylosis without myelopathy or radiculopathy, lumbar region: Secondary | ICD-10-CM | POA: Diagnosis not present

## 2017-10-06 NOTE — Telephone Encounter (Signed)
Take Lasix 60 mg bid x 3 days, then back to 40 mg bid.  Raise legs when resting.

## 2017-10-06 NOTE — Telephone Encounter (Signed)
Pt called to inform that she has swelling in both feet. She is on 40 mg lasix BID. Pt reports no SOB and she has not checked her weight. Please advise.

## 2017-10-06 NOTE — Telephone Encounter (Signed)
Pt notified and verbalizes understanding.

## 2017-10-11 ENCOUNTER — Other Ambulatory Visit: Payer: Self-pay | Admitting: *Deleted

## 2017-10-11 MED ORDER — FENTANYL 25 MCG/HR TD PT72: 25.0000 ug | MEDICATED_PATCH | TRANSDERMAL | 0 refills | Status: DC

## 2017-10-11 NOTE — Telephone Encounter (Signed)
Patient requested refill NCCSRS Verified LR: 09/12/17 Pharmacy Confirmed Pended Rx and sent to Dr. Bubba Camp for approval.  (Dr. Cyndi Lennert patient)

## 2017-10-12 ENCOUNTER — Ambulatory Visit: Payer: Medicare Other | Attending: Family Medicine

## 2017-10-12 DIAGNOSIS — M6281 Muscle weakness (generalized): Secondary | ICD-10-CM | POA: Diagnosis not present

## 2017-10-12 DIAGNOSIS — R252 Cramp and spasm: Secondary | ICD-10-CM | POA: Insufficient documentation

## 2017-10-12 DIAGNOSIS — M545 Low back pain, unspecified: Secondary | ICD-10-CM

## 2017-10-12 DIAGNOSIS — G8929 Other chronic pain: Secondary | ICD-10-CM | POA: Diagnosis not present

## 2017-10-12 NOTE — Therapy (Signed)
[facility] [address], [place], [place], [address] Phone: [phone]   Fax:  [phone]  Physical Therapy Treatment  Patient Details  Name: Sarah Mays MRN: [id number] Date of Birth: 1930/07/21 Referring Provider: Hulan Saas, MD   Encounter Date: 10/12/2017  PT End of Session - 10/12/17 1526    Visit Number  4    Date for PT Re-Evaluation  11/16/17    Authorization Type  Medicare/BCBS    PT Start Time  1447   dry needling   PT Stop Time  1536    PT Time Calculation (min)  49 min    Activity Tolerance  Patient tolerated treatment well    Behavior During Therapy  [facility] for tasks assessed/performed       Past Medical History:  Diagnosis Date  . Abnormal stress test    a. 11/2011 Ex MV: EF 80%, small, partially reversible anteroapical defect->mild ischemia vs attenuation-->med Rx.  [name] Kitchen Alopecia, unspecified   . Arthritis   . Chronic diastolic CHF (congestive heart failure) ([place])    a. 11/2013 Echo: EF 60-65%, no rwma, mild TR, PASP 27mmHg.  . Closed fracture of lower end of radius with ulna   . Deafness    Left, s/p multiple surgeries  . Essential hypertension   . Gouty arthropathy, unspecified   . History of kidney stones   . Hypothyroidism   . Insomnia, unspecified   . Macular degeneration    Left, s/p inj. tx  . Mitral valve disorders(424.0)   . Neck mass    right, w/u including MRI negative  . PAF (paroxysmal atrial fibrillation) (HCC)    a. on amio (02/2012 mild obstruction on PFT's) and xarelto.  . Polycythemia vera(238.4)   . Pure hypercholesterolemia   . Senile osteoporosis    Reclast in the past  . Tricuspid valve disorders, specified as nonrheumatic     Past Surgical History:  Procedure Laterality Date  . BREAST BIOPSY  06/18/1998   left  . CARDIOVERSION N/A 07/15/2017   Procedure: CARDIOVERSION;  Surgeon: Larey Dresser, MD;  Location: [facility] ENDOSCOPY;  Service: Cardiovascular;   Laterality: N/A;  . ELECTROPHYSIOLOGIC STUDY N/A 01/08/2016   Procedure: Atrial Fibrillation Ablation;  Surgeon: Thompson Grayer, MD;  Location: [facility] CV LAB;  Service: Cardiovascular;  Laterality: N/A;  . FEMUR IM NAIL Right 10/08/2015   Procedure: INTRAMEDULLARY (IM) NAIL FEMORAL RIGHT;  Surgeon: Paralee Cancel, MD;  Location: [facility] ORS;  Service: Orthopedics;  Laterality: Right;  . MASS EXCISION Right 11/26/2016   Procedure: EXCISION RIGHT LATERAL NECK MASS;  Surgeon: Jerrell Belfast, MD;  Location: [facility];  Service: ENT;  Laterality: Right;  . TEE WITHOUT CARDIOVERSION N/A 07/15/2017   Procedure: TRANSESOPHAGEAL ECHOCARDIOGRAM (TEE);  Surgeon: Larey Dresser, MD;  [facility] END[facility];  Service: Cardiovascular;  Laterality: N/A;  . TONSILLECTOMY    . TOTAL ABDOMINAL HYSTERECTOMY    . TYMPANOPLASTY Bilateral     There were no vitals filed for this visit.  Subjective Assessment - 10/12/17 1450    Subjective  I got an injection last week and it didn't help at all.  I am not sure if the dry needling has helped.      Pertinent History  1 fall- 08/08/17.  Osteoporosis    Currently in Pain?  Yes    Pain Score  6     Pain Location  Back    Pain Orientation  Lower;Left    Pain Descriptors / Indicators  Aching    Pain Type  Chronic pain    Pain Onset  More than a month ago    Pain Frequency  Intermittent    Aggravating Factors   not sure, it just hurts    Pain Relieving Factors  Tylenol, medication, Salanpas as needed.                       [facility] Adult PT Treatment/Exercise - 10/12/17 0001      Exercises   Exercises  Knee/Hip      Knee/Hip Exercises: Stretches   Active Hamstring Stretch  Both;2 reps;20 seconds      Knee/Hip Exercises: Aerobic   Nustep  Level 1x 8 minutes   PT present to discuss progress     Knee/Hip Exercises: Sidelying   Clams  2x10 on Lt      Modalities   Modalities  Moist Heat      Moist Heat Therapy   Number Minutes Moist Heat  10 Minutes     Moist Heat Location  Lumbar Spine      Manual Therapy   Manual Therapy  Soft tissue mobilization    Manual therapy comments  Lt gluteals-trigger point release       Trigger Point Dry Needling - 10/12/17 1500    Consent Given?  Yes    Muscles Treated Lower Body  Gluteus minimus;Gluteus maximus;Piriformis   Lt only   Gluteus Maximus Response  Twitch response elicited;Palpable increased muscle length    Gluteus Minimus Response  Twitch response elicited;Palpable increased muscle length    Piriformis Response  Twitch response elicited;Palpable increased muscle length             PT Short Term Goals - 09/21/17 1010      PT SHORT TERM GOAL #1   Title  be independent in initial HEP    Time  4    Period  Weeks    Status  New    Target Date  10/19/17      PT SHORT TERM GOAL #2   Title  report a 25% reduction in LBP at the end of the day    Time  4    Period  Weeks    Status  New    Target Date  10/19/17      PT SHORT TERM GOAL #3   Title  verbalize and demonstrate understanding of fall prevention and osteoporosis precautions/mechanics modifications for osteoporosis    Time  4    Period  Weeks    Status  New    Target Date  10/19/17        PT Long Term Goals - 09/21/17 1011      PT LONG TERM GOAL #1   Title  independent with advanced HEP to include exercises for osteoporosis    Time  8    Period  Weeks    Status  New    Target Date  11/16/17      PT LONG TERM GOAL #2   Title  report a 50% reduction in LBP at the end of the day    Time  8    Period  Weeks    Status  New    Target Date  11/16/17      PT LONG TERM GOAL #3   Title  reduce FOTO to < or = to 36% limitation    Time  8    Period  Weeks    Status  New    Target Date  11/16/17      PT LONG TERM GOAL #4   Title  --            Plan - 10/12/17 1458    Clinical Impression Statement  Pt denies any change in lumbar symptoms since the start of care.  Pt reports 6/10 pain and pain has  remained unchanged since spinal injection last week.  PT reviewed HEP and she required verbal and tactile cues for technique.  Pt with tension and trigger points in Lt gluteals and lumbar multifidi and demonstrated improved tissue mobility after dry needling today.  PT will assess response to dry needling next session and if no change, will D/C dry needling.      Clinical Presentation  Stable    Rehab Potential  Good    PT Frequency  2x / week    PT Duration  8 weeks    PT Treatment/Interventions  ADLs/Self Care Home Management;Cryotherapy;Occupational psychologist;Therapeutic exercise;Therapeutic activities;Neuromuscular re-education;Manual techniques;Passive range of motion;Dry needling;Taping    PT Next Visit Plan  assess response to dry needling to gluteals and bil lumbar paraspinals, scapular stabilzation with ab bracing, osteo education, kinesiotape    Consulted and Agree with Plan of Care  Patient       Patient will benefit from skilled therapeutic intervention in order to improve the following deficits and impairments:  Pain, Improper body mechanics, Increased muscle spasms, Postural dysfunction, Decreased strength, Decreased range of motion, Impaired flexibility  Visit Diagnosis: Muscle weakness (generalized)  Cramp and spasm  Chronic bilateral low back pain without sciatica     Problem List Patient Active Problem List   Diagnosis Date Noted  . Hydronephrosis with ureteropelvic junction (UPJ) obstruction 08/22/2017  . DDD (degenerative disc disease), cervical 08/09/2017  . Hypercoagulable state due to atrial fibrillation ([place]) 08/09/2017  . Presbycusis of both ears 08/03/2017  . Deficiency anemia 07/26/2017  . Recurrent epistaxis 02/25/2017  . Iron deficiency anemia 02/21/2017  . Pure hypercholesterolemia   . PONV (postoperative nausea and vomiting)   . PAF (paroxysmal atrial fibrillation) ([place])   . Neck mass   . Insomnia, unspecified   .  History of kidney stones   . Deafness   . Closed fracture of lower end of radius with ulna   . Arthritis   . Abnormal stress test   . Rotator cuff arthropathy, right 09/08/2016  . Trigger point of right shoulder region 05/05/2016  . Spondylosis of cervical region without myelopathy or radiculopathy 05/05/2016  . Chondrocalcinosis 05/05/2016  . Chronic pain disorder 05/05/2016  . A-fib ([place]) 01/08/2016  . Closed fracture of shaft of right femur, initial encounter ([place]) 10/25/2015  . Status post-operative repair of hip fracture 10/25/2015  . Greater trochanteric bursitis of right hip 09/29/2015  . Asymptomatic cholelithiasis 05/09/2015  . Essential hypertension 11/07/2014  . Hair loss 11/07/2014  . Bilateral leg edema 11/07/2014  . Constipation due to pain medication 11/06/2014  . Hypotension due to drugs 11/06/2014  . Chest pain 09/01/2014  . Osteoporosis 01/04/2014  . Gout 01/04/2014  . Headache 01/04/2014  . Hypothyroidism 11/19/2013  . Hypercalcemia 10/11/2013  . Insomnia 05/16/2013  . Lumbar back pain 05/16/2013  . Polycythemia vera ([place])   . Mass of right side of neck   . Senile osteoporosis   . Macular degeneration   . Sick sinus syndrome ([place]) 03/09/2012  . CAD (coronary artery disease) 12/27/2011  . Paroxysmal atrial fibrillation ([place]) 12/27/2011  . Chronic  diastolic CHF (congestive heart failure) ([place]) 12/27/2011  . HTN (hypertension) 12/27/2011    Sigurd Sos, PT 10/12/17 3:28 PM  [facility] [address] [place], [place], [address] Phone: [phone]   Fax:  [phone]  Name: Sarah Mays MRN: [id number] Date of Birth: May 03, 1930

## 2017-10-13 ENCOUNTER — Encounter (HOSPITAL_COMMUNITY): Payer: Self-pay | Admitting: *Deleted

## 2017-10-13 MED ORDER — FUROSEMIDE 40 MG PO TABS: 60.0000 mg | ORAL_TABLET | Freq: Two times a day (BID) | ORAL | 3 refills | Status: DC

## 2017-10-13 NOTE — Telephone Encounter (Signed)
Called patient back and she is agreeable with plan.  Medication updated.

## 2017-10-13 NOTE — Telephone Encounter (Signed)
Patient called back and reported that while takiong the increased dose of 60 mg BID for 3 days the swelling went down in her legs/feet.  However since being back on her regular dose of 40 mg BID the swelling has come right back.  She has not been checking daily weights.  She comes to see Dr. Aundra Dubin next Tuesday and is asking if she can continue taking 60 mg BID.  Will forward to Dr. Aundra Dubin to review and will call patient back.

## 2017-10-13 NOTE — Progress Notes (Signed)
Charted on wrong patient, please disregard.

## 2017-10-13 NOTE — Telephone Encounter (Signed)
She can continue Lasix 60 bid.

## 2017-10-13 NOTE — Addendum Note (Signed)
Addended by: Darron Doom on: 10/13/2017 12:20 PM   Modules accepted: Orders

## 2017-10-13 NOTE — Progress Notes (Signed)
Received medical records request from [facility] , CASE # [id number]  Requested records faxed today to: [phone]   Original request will be scanned to patient's electronic medical record.

## 2017-10-17 ENCOUNTER — Encounter: Payer: Self-pay | Admitting: Internal Medicine

## 2017-10-18 ENCOUNTER — Ambulatory Visit (HOSPITAL_COMMUNITY)
Admission: RE | Admit: 2017-10-18 | Discharge: 2017-10-18 | Disposition: A | Discharge status: Home / Self Care | Payer: Medicare Other | Source: Ambulatory Visit | Attending: Cardiology | Admitting: Cardiology

## 2017-10-18 VITALS — BP 128/70 | HR 76 | Wt 106.4 lb

## 2017-10-18 DIAGNOSIS — I495 Sick sinus syndrome: Secondary | ICD-10-CM | POA: Diagnosis not present

## 2017-10-18 DIAGNOSIS — Z79899 Other long term (current) drug therapy: Secondary | ICD-10-CM | POA: Insufficient documentation

## 2017-10-18 DIAGNOSIS — E039 Hypothyroidism, unspecified: Secondary | ICD-10-CM | POA: Diagnosis not present

## 2017-10-18 DIAGNOSIS — I5033 Acute on chronic diastolic (congestive) heart failure: Secondary | ICD-10-CM | POA: Insufficient documentation

## 2017-10-18 DIAGNOSIS — I480 Paroxysmal atrial fibrillation: Secondary | ICD-10-CM | POA: Insufficient documentation

## 2017-10-18 DIAGNOSIS — Z09 Encounter for follow-up examination after completed treatment for conditions other than malignant neoplasm: Secondary | ICD-10-CM | POA: Insufficient documentation

## 2017-10-18 DIAGNOSIS — M7989 Other specified soft tissue disorders: Secondary | ICD-10-CM | POA: Insufficient documentation

## 2017-10-18 DIAGNOSIS — R9431 Abnormal electrocardiogram [ECG] [EKG]: Secondary | ICD-10-CM | POA: Diagnosis not present

## 2017-10-18 DIAGNOSIS — I5032 Chronic diastolic (congestive) heart failure: Secondary | ICD-10-CM | POA: Diagnosis not present

## 2017-10-18 DIAGNOSIS — Z79890 Hormone replacement therapy (postmenopausal): Secondary | ICD-10-CM | POA: Insufficient documentation

## 2017-10-18 DIAGNOSIS — I110 Hypertensive heart disease with heart failure: Secondary | ICD-10-CM | POA: Diagnosis not present

## 2017-10-18 DIAGNOSIS — Z87891 Personal history of nicotine dependence: Secondary | ICD-10-CM | POA: Insufficient documentation

## 2017-10-18 DIAGNOSIS — Z8249 Family history of ischemic heart disease and other diseases of the circulatory system: Secondary | ICD-10-CM | POA: Insufficient documentation

## 2017-10-18 LAB — BASIC METABOLIC PANEL
Anion gap: 9 (ref 5–15)
BUN: 18 mg/dL (ref 8–23)
CALCIUM: 11 mg/dL — AB (ref 8.9–10.3)
CHLORIDE: 100 mmol/L (ref 98–111)
CO2: 31 mmol/L (ref 22–32)
Creatinine, Ser: 0.65 mg/dL (ref 0.44–1.00)
GFR calc Af Amer: >60 mL/min (ref >60)
GFR calc non Af Amer: >60 mL/min (ref >60)
Glucose, Bld: 64 mg/dL — ABNORMAL LOW (ref 70–99)
POTASSIUM: 3.5 mmol/L (ref 3.5–5.1)
Sodium: 140 mmol/L (ref 135–145)

## 2017-10-18 NOTE — Patient Instructions (Signed)
Labs today (will call for abnormal results, otherwise no news is good news)  Please have labs drawn in 2 weeks (bmet)--see prescription.  Follow up in 3 months.

## 2017-10-19 ENCOUNTER — Ambulatory Visit: Payer: Medicare Other

## 2017-10-19 DIAGNOSIS — M545 Low back pain: Secondary | ICD-10-CM

## 2017-10-19 DIAGNOSIS — G8929 Other chronic pain: Secondary | ICD-10-CM

## 2017-10-19 DIAGNOSIS — R252 Cramp and spasm: Secondary | ICD-10-CM

## 2017-10-19 DIAGNOSIS — M6281 Muscle weakness (generalized): Secondary | ICD-10-CM

## 2017-10-19 NOTE — Progress Notes (Signed)
Patient ID: Sarah Mays, female   DOB: 02-06-1931, 84 y.o.   MRN: [id number] PCP: Dr Hollace Kinnier Cardiology: Dr. Aundra Dubin  84 y.o. with paroxysmal atrial fibrillation and chronic diastolic CHF returns for cardiology evaluation.  In 7/16, she was admitted with chest pain.  Cardiolite showed no ischemia or infarction.  Echo in 6/17 showed EF 60-65%.  She developed problems elevated HR when in atrial fibrillation but bradycardia when in NSR.  She underwent atrial fibrillation ablation in 11/17.  She is tolerating Xarelto without melena or BRBPR.  Dr. Rayann Heman had her stop amiodarone.    Echo in 11/18 showed EF 60-65%, PASP 47 mmHg.   TEE-guided DCCV back to NSR in 6/19.  TEE showed EF 55-60% with moderate-severe MR.   She developed multiple episodes of epistaxis with Xarelto use.  We have transitioned her to apixaban 2.5 mg bid and she has not had epistaxis in about a month.   She presents today for regular follow up of CHF and atrial fibrillation. She is in NSR today. Uses walker for balance.  She has peripheral edema.  No chest pain.  No dyspnea walking in the halls at [employer]  She is bothered most by low back pain.  No orthopnea/PND.    ECG: NSR, 1st degree AVB (personally reviewed).   Labs (7/16): K 4, creatinine 0.94, calcium 10.8, LFTs normal Labs (9/16): K 4, creatinine 0.9, LDL 69, TSH normal, HCT 42.3 Labs (5/17): K 4.6, creatinine 0.94, HCT 41.5, LFTs normal. Labs (6/17): K 5, creatinine 1.04, LDL 42, HDL 101, TSH normal Labs (12/17): K 3.7, creatinine 0.76, hgb 11.2 Labs (4/18): K 4.6, creatinine 0.91 Labs (10/18): K 4.5, creatinine 0.62 Labs (12/18): hgb 13.5 Labs (1/19): K 4.3, creatinine 0.6 Labs (4/19): hgb 13.5 Labs (7/19): K 4.5, creatinine 0.5, hgb 12.1  PMH: 1. Atrial fibrillation: Diagnosed initially in 10/13.  Holter monitor in 11/13 showed atrial fibrillation with average rate 65.  - Atrial fibrillation ablation in 11/17.  - TEE-guided DCCV in 6/19.  2. Chronic  diastolic CHF: Echo (08/67) with EF 65-70%, mild MR, moderate biatrial enlargement, moderate TR, PA systolic pressure 45 mmHg.   Echo (10/15): EF 60-65%.  Echo (7/16) with EF 60-65%, mild MR.  - Echo (6/17): EF 60-65%, PASP 41 mmHg.  - Echo (11/18): EF 60-65%, PASP 47 mmHg - TEE (6/19): EF 55-60%, mild LVH, mildly decreased RV systolic function, peak RV-RA gradient 40 mmHg, moderate-severe MR with bileaflet MVP, ERO 0.37 cm^2.  3. ETT-Sestamibi (11/13): Exercised to stage II, small partially reversible anteroapical perfusion defect suggesting mild ischemia versus attenuation, EF 80%.  Cardiolite (7/16) with EF 64%, no ischemia or infarction.  4. HTN: Lower extremity swelling with amlodipine.  5. Polycythemia vera: Has had phlebotomy only once.  6. Hypothyroidism 7. Macular degeneration 8. TAH 1980 9. Familial hypocalciuric hypercalcemia 10. PFTs (amiodarone use) in 1/14 showed a mild obstructive defect.  11. Degenerative disc disease 12. Sick sinus syndrome: Has not required PPM.  13. Mitral regurgitation: TEE (6/19) with moderate-severe MR with bileaflet MVP, ERO 0.37 cm^2.   SH: Prior smoker (years ago).  Widowed, lived in [place] but now at [employer]  Occasional ETOH.  Daughter works for [employer].   FH: CAD  Review of systems complete and found to be negative unless listed in HPI.    Current Outpatient Medications  Medication Sig Dispense Refill  . acetaminophen (TYLENOL) 325 MG tablet Take 2 tablets (650 mg total) by mouth every 4 (four) hours as needed for  mild pain or moderate pain. Not to exceed 3g in 24h 120 tablet 0  . apixaban (ELIQUIS) 2.5 MG TABS tablet Take 1 tablet (2.5 mg total) by mouth 2 (two) times daily. 60 tablet 6  . Cholecalciferol (VITAMIN D) 2000 units CAPS Take 2,000 Units by mouth daily.     . fentaNYL (DURAGESIC - DOSED MCG/HR) 25 MCG/HR patch Place 1 patch (25 mcg total) onto the skin every 3 (three) days. 10 patch 0  . furosemide (LASIX) 40 MG tablet Take  1.5 tablets (60 mg total) by mouth 2 (two) times daily. 90 tablet 3  . gabapentin (NEURONTIN) 100 MG capsule Take 100 mg by mouth at bedtime.    . hydrALAZINE (APRESOLINE) 50 MG tablet TAKE 1 1/2 TABLETS THREE TIMES DAILY. 135 tablet 2  . hydroxyurea (HYDREA) 500 MG capsule 500 mg on Mondays, Wednesdays and Fridays and 1000 mg the other days of the week by mouth 100 capsule 9  . levothyroxine (SYNTHROID, LEVOTHROID) 100 MCG tablet Take 1 tablet (100 mcg total) by mouth daily before breakfast. 90 tablet 0  . Magnesium 250 MG TABS Take 250 mg by mouth daily.     . Melatonin 10 MG TABS Take 10 mg by mouth at bedtime.    . methocarbamol (ROBAXIN) 500 MG tablet Take 1 tablet (500 mg total) by mouth 2 (two) times daily as needed for muscle spasms. 6 tablet 0  . Multiple Vitamins-Minerals (PRESERVISION AREDS 2) CAPS Take 1 capsule by mouth 2 (two) times daily.     . potassium chloride (K-DUR) 10 MEQ tablet TAKE 1 TABLET BY MOUTH TWICE DAILY. 60 tablet 3  . senna-docusate (SENOKOT-S) 8.6-50 MG tablet Take 2 tablets by mouth 2 (two) times daily.    . sodium chloride (OCEAN) 0.65 % SOLN nasal spray Place 2 sprays into both nostrils as needed for congestion.    . traMADol (ULTRAM) 50 MG tablet Take 50 mg by mouth 3 (three) times daily as needed.     No current facility-administered medications for this encounter.    Vitals:   10/18/17 1433  BP: 128/70  Pulse: 76  SpO2: 95%  Weight: 48.3 kg (106 lb 6.4 oz)    Wt Readings from Last 3 Encounters:  10/18/17 48.3 kg (106 lb 6.4 oz)  09/07/17 47.6 kg (105 lb)  08/22/17 46.6 kg (102 lb 12.8 oz)   Physical Exam General: NAD Neck: JVP 8 cm, no thyromegaly or thyroid nodule.  Lungs: Clear to auscultation bilaterally with normal respiratory effort. CV: Nondisplaced PMI.  Heart regular S1/S2, +S4, no murmur.  1+ ankle edema.  No carotid bruit.  Normal pedal pulses.  Abdomen: Soft, nontender, no hepatosplenomegaly, no distention.  Skin: Intact without  lesions or rashes.  Neurologic: Alert and oriented x 3.  Psych: Normal affect. Extremities: No clubbing or cyanosis.  HEENT: Normal.   Assessment/Plan: 1. Atrial fibrillation: First noted in 10/13.  Atrial fibrillation has triggered acute on chronic diastolic CHF in the past.  CHADSVASC score is 85 (age, female gender, HTN, CHF).  She is anticoagulated with Xarelto 15 mg daily which is appropriate for her creatinine clearance.  She had atrial fibrillation ablation in 11/17.  She is now off amiodarone.  TEE-guided DCCV in 6/19.  She is in NSR today.   - She is not on nodal blockers due to h/o sick sinus syndrome.  - Xarelto stopped and apixaban 2.5 mg bid started due to epistaxis, no epistaxis x 1 month now.  2. Chronic diastolic CHF:  NYHA class II symptoms currently. Mild volume overload on exam.  - Increase Lasix to 80 mg daily alternating with 80 qam/40 qpm.  BMET today and again in 2 wks.   - Continue KCl 31mEq bid.  3. HTN: BP reasonably controlled, continue current meds.     4. Bradycardia: Suspect she has a degree of sick sinus syndrome. This has been stable off nodal blocking agents.  5. Hypercalcemia: Mild.  Has diagnosis of familial hypocalciuric hypercalcemia.   Followup in 3 months.    Loralie Champagne 10/19/2017

## 2017-10-19 NOTE — Therapy (Signed)
[facility] [address] [place], [place], [address] Phone: [phone]   Fax:  [phone]  Physical Therapy Treatment  Patient Details  Name: Sarah Mays MRN: [id number] Date of Birth: [birth year] Referring Provider: Hulan Saas, MD   Encounter Date: 10/19/2017  PT End of Session - 10/19/17 1512    Visit Number  5    Date for PT Re-Evaluation  11/16/17    Authorization Type  Medicare/BCBS    PT Start Time  1447   dry needling   PT Stop Time  1523    PT Time Calculation (min)  36 min    Activity Tolerance  Patient tolerated treatment well    Behavior During Therapy  [facility] for tasks assessed/performed       Past Medical History:  Diagnosis Date  . Abnormal stress test    a. 11/2011 Ex MV: EF 80%, small, partially reversible anteroapical defect->mild ischemia vs attenuation-->med Rx.  [name] Kitchen Alopecia, unspecified   . Arthritis   . Chronic diastolic CHF (congestive heart failure) ([place])    a. 11/2013 Echo: EF 60-65%, no rwma, mild TR, PASP 40mmHg.  . Closed fracture of lower end of radius with ulna   . Deafness    Left, s/p multiple surgeries  . Essential hypertension   . Gouty arthropathy, unspecified   . History of kidney stones   . Hypothyroidism   . Insomnia, unspecified   . Macular degeneration    Left, s/p inj. tx  . Mitral valve disorders(424.0)   . Neck mass    right, w/u including MRI negative  . PAF (paroxysmal atrial fibrillation) (H[facility])    a. on amio (02/2012 mild obstruction on PFT's) and xarelto.  . Polycythemia vera(238.4)   . Pure hypercholesterolemia   . Senile osteoporosis    Reclast in the past  . Tricuspid valve disorders, specified as nonrheumatic     Past Surgical History:  Procedure Laterality Date  . BREAST BIOPSY  06/18/1998   left  . CARDIOVERSION N/A 07/15/2017   Procedure: CARDIOVERSION;  Surgeon: Larey Dresser, MD;  Location: [facility] ENDOSCOPY;  Service: Cardiovascular;   Laterality: N/A;  . ELECTROPHYSIOLOGIC STUDY N/A 01/08/2016   Procedure: Atrial Fibrillation Ablation;  Surgeon: Thompson Grayer, MD;  Location: [facility] LAB;  Service: Cardiovascular;  Laterality: N/A;  . FEMUR IM NAIL Right 10/08/2015   Procedure: INTRAMEDULLARY (IM) NAIL FEMORAL RIGHT;  Surgeon: Paralee Cancel, MD;  Location: [facility] ORS;  Service: Orthopedics;  Laterality: Right;  . MASS EXCISION Right 11/26/2016   Procedure: EXCISION RIGHT LATERAL NECK MASS;  Surgeon: Jerrell Belfast, MD;  Location: [facility];  Service: ENT;  Laterality: Right;  . TEE WITHOUT CARDIOVERSION N/A 07/15/2017   Procedure: TRANSESOPHAGEAL ECHOCARDIOGRAM (TEE);  Surgeon: Larey Dresser, MD;  Location: [facility] ENDOSCOPY;  Service: Cardiovascular;  Laterality: N/A;  . TONSILLECTOMY    . TOTAL ABDOMINAL HYSTERECTOMY    . TYMPANOPLASTY Bilateral     There were no vitals filed for this visit.  Subjective Assessment - 10/19/17 1446    Subjective  I had 2-3 days of feeling better and I am not sure it was dry needling or the injection.  Not doing exercises like I should and I havent been doing the exercises the therapist gave me for balance.      Currently in Pain?  Yes    Pain Score  5     Pain Location  Back    Pain Orientation  Left;Lower  Pain Descriptors / Indicators  Aching    Pain Onset  More than a month ago    Pain Frequency  Intermittent    Aggravating Factors   not sure, it just hurts    Pain Relieving Factors  Tylenol, medication, [place]                       [facility] Adult PT Treatment/Exercise - 10/19/17 0001      Modalities   Modalities  Moist Heat      Moist Heat Therapy   Number Minutes Moist Heat  10 Minutes    Moist Heat Location  Lumbar Spine      Manual Therapy   Manual Therapy  Soft tissue mobilization    Manual therapy comments  Lt gluteals-trigger point release       Trigger Point Dry Needling - 10/19/17 1451    Consent Given?  Yes    Muscles Treated Lower Body  Gluteus  minimus;Gluteus maximus;Piriformis   Lt only   Gluteus Maximus Response  Twitch response elicited;Palpable increased muscle length    Gluteus Minimus Response  Twitch response elicited;Palpable increased muscle length    Piriformis Response  Twitch response elicited;Palpable increased muscle length             PT Short Term Goals - 10/19/17 1448      PT SHORT TERM GOAL #1   Title  be independent in initial HEP    Status  Achieved      PT SHORT TERM GOAL #2   Title  report a 25% reduction in LBP at the end of the day    Time  4    Period  Weeks    Status  On-going        PT Long Term Goals - 09/21/17 1011      PT LONG TERM GOAL #1   Title  independent with advanced HEP to include exercises for osteoporosis    Time  8    Period  Weeks    Status  New    Target Date  11/16/17      PT LONG TERM GOAL #2   Title  report a 50% reduction in LBP at the end of the day    Time  8    Period  Weeks    Status  New    Target Date  11/16/17      PT LONG TERM GOAL #3   Title  reduce FOTO to < or = to 36% limitation    Time  8    Period  Weeks    Status  New    Target Date  11/16/17      PT LONG TERM GOAL #4   Title  --            Plan - 10/19/17 1517    Clinical Impression Statement  PT provided dry needling and soft tissue elongation only today as pt has HEP in place that she is not performing consistently.  PT encouraged to do both exercises issued for pain in addition to balance exercises issued at [place].  Pt reports 2-3 days of relief after dry needling last session and then pain returned.  Pt demonstrated improved tissue mobility and reduced pain after dry needling today.  Pt will attend 1 more session for manual to address pain and discuss need for further PT.      Rehab Potential  Good    PT Frequency  2x / week    PT Duration  8 weeks    PT Treatment/Interventions  ADLs/Self Care Home Management;Cryotherapy;Banker;Therapeutic exercise;Therapeutic activities;Neuromuscular re-education;Manual techniques;Passive range of motion;Dry needling;Taping    PT Next Visit Plan  discuss D/C vs continuation, dry needling to Lt gluteals.         Patient will benefit from skilled therapeutic intervention in order to improve the following deficits and impairments:  Pain, Improper body mechanics, Increased muscle spasms, Postural dysfunction, Decreased strength, Decreased range of motion, Impaired flexibility  Visit Diagnosis: Cramp and spasm  Chronic bilateral low back pain without sciatica  Muscle weakness (generalized)     Problem List Patient Active Problem List   Diagnosis Date Noted  . Hydronephrosis with ureteropelvic junction (UPJ) obstruction 08/22/2017  . DDD (degenerative disc disease), cervical 08/09/2017  . Hypercoagulable state due to atrial fibrillation ([place]) 08/09/2017  . Presbycusis of both ears 08/03/2017  . Deficiency anemia 07/26/2017  . Recurrent epistaxis 02/25/2017  . Iron deficiency anemia 02/21/2017  . Pure hypercholesterolemia   . PONV (postoperative nausea and vomiting)   . PAF (paroxysmal atrial fibrillation) ([place])   . Neck mass   . Insomnia, unspecified   . History of kidney stones   . Deafness   . Closed fracture of lower end of radius with ulna   . Arthritis   . Abnormal stress test   . Rotator cuff arthropathy, right 09/08/2016  . Trigger point of right shoulder region 05/05/2016  . Spondylosis of cervical region without myelopathy or radiculopathy 05/05/2016  . Chondrocalcinosis 05/05/2016  . Chronic pain disorder 05/05/2016  . A-fib ([place]) 01/08/2016  . Closed fracture of shaft of right femur, initial encounter ([place]) 10/25/2015  . Status post-operative repair of hip fracture 10/25/2015  . Greater trochanteric bursitis of right hip 09/29/2015  . Asymptomatic cholelithiasis 05/09/2015  . Essential hypertension 11/07/2014  . Hair loss 11/07/2014  .  Bilateral leg edema 11/07/2014  . Constipation due to pain medication 11/06/2014  . Hypotension due to drugs 11/06/2014  . Chest pain 09/01/2014  . Osteoporosis 01/04/2014  . Gout 01/04/2014  . Headache 01/04/2014  . Hypothyroidism 11/19/2013  . Hypercalcemia 10/11/2013  . Insomnia 05/16/2013  . Lumbar back pain 05/16/2013  . Polycythemia vera ([place])   . Mass of right side of neck   . Senile osteoporosis   . Macular degeneration   . Sick sinus syndrome ([place]) 03/09/2012  . CAD (coronary artery disease) 12/27/2011  . Paroxysmal atrial fibrillation ([place]) 12/27/2011  . Chronic diastolic CHF (congestive heart failure) ([place]) 12/27/2011  . HTN (hypertension) 12/27/2011    Sigurd Sos, PT 10/19/17 3:18 PM  [facility] [address] [place], [place], [address] Phone: [phone]   Fax:  [phone]  Name: DOLORES EWING MRN: [id number] Date of Birth: 13-Sep-1930

## 2017-10-24 ENCOUNTER — Other Ambulatory Visit: Payer: Self-pay | Admitting: Internal Medicine

## 2017-10-26 ENCOUNTER — Ambulatory Visit: Payer: Medicare Other

## 2017-10-26 DIAGNOSIS — R252 Cramp and spasm: Secondary | ICD-10-CM | POA: Diagnosis not present

## 2017-10-26 DIAGNOSIS — G8929 Other chronic pain: Secondary | ICD-10-CM

## 2017-10-26 DIAGNOSIS — M545 Low back pain: Secondary | ICD-10-CM

## 2017-10-26 DIAGNOSIS — M6281 Muscle weakness (generalized): Secondary | ICD-10-CM

## 2017-10-26 NOTE — Therapy (Signed)
[facility] [address], [place], [place], [address] Phone: [phone]   Fax:  [phone]  Physical Therapy Treatment  Patient Details  Name: Sarah Mays MRN: [id number] Date of Birth: 1930/07/19 Referring Provider: Hulan Saas, MD   Encounter Date: 10/26/2017  PT End of Session - 10/26/17 1519    Visit Number  6    Date for PT Re-Evaluation  11/16/17    Authorization Type  Medicare/BCBS    PT Start Time  1449    PT Stop Time  1529    PT Time Calculation (min)  40 min       Past Medical History:  Diagnosis Date  . Abnormal stress test    a. 11/2011 Ex MV: EF 80%, small, partially reversible anteroapical defect->mild ischemia vs attenuation-->med Rx.  [name] Kitchen Alopecia, unspecified   . Arthritis   . Chronic diastolic CHF (congestive heart failure) ([place])    a. 11/2013 Echo: EF 60-65%, no rwma, mild TR, PASP 3mmHg.  . Closed fracture of lower end of radius with ulna   . Deafness    Left, s/p multiple surgeries  . Essential hypertension   . Gouty arthropathy, unspecified   . History of kidney stones   . Hypothyroidism   . Insomnia, unspecified   . Macular degeneration    Left, s/p inj. tx  . Mitral valve disorders(424.0)   . Neck mass    right, w/u including MRI negative  . PAF (paroxysmal atrial fibrillation) (HCC)    a. on amio (02/2012 mild obstruction on PFT's) and xarelto.  . Polycythemia vera(238.4)   . Pure hypercholesterolemia   . Senile osteoporosis    Reclast in the past  . Tricuspid valve disorders, specified as nonrheumatic     Past Surgical History:  Procedure Laterality Date  . BREAST BIOPSY  06/18/1998   left  . CARDIOVERSION N/A 07/15/2017   Procedure: CARDIOVERSION;  Surgeon: Larey Dresser, MD;  Location: [facility] ENDOSCOPY;  Service: Cardiovascular;  Laterality: N/A;  . ELECTROPHYSIOLOGIC STUDY N/A 01/08/2016   Procedure: Atrial Fibrillation Ablation;  Surgeon: Thompson Grayer, MD;   Location: [place] CV LAB;  Service: Cardiovascular;  Laterality: N/A;  . FEMUR IM NAIL Right 10/08/2015   Procedure: INTRAMEDULLARY (IM) NAIL FEMORAL RIGHT;  Surgeon: Paralee Cancel, MD;  [facility];  Service: Orthopedics;  Laterality: Right;  . MASS EXCISION Right 11/26/2016   Procedure: EXCISION RIGHT LATERAL NECK MASS;  Surgeon: Jerrell Belfast, MD;  Location: [place];  Service: ENT;  Laterality: Right;  . TEE WITHOUT CARDIOVERSION N/A 07/15/2017   Procedure: TRANSESOPHAGEAL ECHOCARDIOGRAM (TEE);  Surgeon: Larey Dresser, MD;  Location: [facility] ENDOSCOPY;  Service: Cardiovascular;  Laterality: N/A;  . TONSILLECTOMY    . TOTAL ABDOMINAL HYSTERECTOMY    . TYMPANOPLASTY Bilateral     There were no vitals filed for this visit.  Subjective Assessment - 10/26/17 1452    Subjective  I am feeling better.  I feel 40% better.     Currently in Pain?  Yes    Pain Score  2     Pain Location  Back    Pain Orientation  Left;Lower    Pain Type  Chronic pain    Pain Onset  More than a month ago    Pain Frequency  Intermittent    Aggravating Factors   not sure, it just hurts    Pain Relieving Factors  Tylenol, medication, [place]  [facility] Adult PT Treatment/Exercise - 10/26/17 0001      Modalities   Modalities  Moist Heat      Moist Heat Therapy   Number Minutes Moist Heat  10 Minutes    Moist Heat Location  Lumbar Spine      Manual Therapy   Manual Therapy  Soft tissue mobilization    Manual therapy comments  Lt gluteals-trigger point release       Trigger Point Dry Needling - 10/26/17 1451    Consent Given?  Yes    Muscles Treated Lower Body  Gluteus minimus;Gluteus maximus   Lt gluteals and bil lumbar multifidi   Gluteus Maximus Response  Twitch response elicited;Palpable increased muscle length    Gluteus Minimus Response  Twitch response elicited;Palpable increased muscle length    Piriformis Response  Twitch response elicited;Palpable  increased muscle length             PT Short Term Goals - 10/19/17 1448      PT SHORT TERM GOAL #1   Title  be independent in initial HEP    Status  Achieved      PT SHORT TERM GOAL #2   Title  report a 25% reduction in LBP at the end of the day    Time  4    Period  Weeks    Status  On-going        PT Long Term Goals - 09/21/17 1011      PT LONG TERM GOAL #1   Title  independent with advanced HEP to include exercises for osteoporosis    Time  8    Period  Weeks    Status  New    Target Date  11/16/17      PT LONG TERM GOAL #2   Title  report a 50% reduction in LBP at the end of the day    Time  8    Period  Weeks    Status  New    Target Date  11/16/17      PT LONG TERM GOAL #3   Title  reduce FOTO to < or = to 36% limitation    Time  8    Period  Weeks    Status  New    Target Date  11/16/17      PT LONG TERM GOAL #4   Title  --            Plan - 10/26/17 1454    Clinical Impression Statement   PT provided dry needling and soft tissue elongation only today as pt has HEP in place and is performing with moderate compliance.  PT encouraged to do both exercises issued for pain in addition to balance exercises issued at [place].  Pt reports a longer period of relief after dry needling next session.  Pt demonstrated improved tissue mobility and reduced pain after dry needling today. Pt with 40% overall improvement overall and manual is helping to improve pain.  Pt will attend 1 more session for manual to address pain with probable D/C to HEP.      Rehab Potential  Good    PT Frequency  2x / week    PT Duration  8 weeks    PT Treatment/Interventions  ADLs/Self Care Home Management;Cryotherapy;Occupational psychologist;Therapeutic exercise;Therapeutic activities;Neuromuscular re-education;Manual techniques;Passive range of motion;Dry needling;Taping    PT Next Visit Plan  Probable D/C next session, 1 more session of dry needling.   FOTO and  final goal assessement    Consulted and Agree with Plan of Care  Patient       Patient will benefit from skilled therapeutic intervention in order to improve the following deficits and impairments:  Pain, Improper body mechanics, Increased muscle spasms, Postural dysfunction, Decreased strength, Decreased range of motion, Impaired flexibility  Visit Diagnosis: Cramp and spasm  Chronic bilateral low back pain without sciatica  Muscle weakness (generalized)     Problem List Patient Active Problem List   Diagnosis Date Noted  . Hydronephrosis with ureteropelvic junction (UPJ) obstruction 08/22/2017  . DDD (degenerative disc disease), cervical 08/09/2017  . Hypercoagulable state due to atrial fibrillation ([place]) 08/09/2017  . Presbycusis of both ears 08/03/2017  . Deficiency anemia 07/26/2017  . Recurrent epistaxis 02/25/2017  . Iron deficiency anemia 02/21/2017  . Pure hypercholesterolemia   . PONV (postoperative nausea and vomiting)   . PAF (paroxysmal atrial fibrillation) ([place])   . Neck mass   . Insomnia, unspecified   . History of kidney stones   . Deafness   . Closed fracture of lower end of radius with ulna   . Arthritis   . Abnormal stress test   . Rotator cuff arthropathy, right 09/08/2016  . Trigger point of right shoulder region 05/05/2016  . Spondylosis of cervical region without myelopathy or radiculopathy 05/05/2016  . Chondrocalcinosis 05/05/2016  . Chronic pain disorder 05/05/2016  . A-fib ([place]) 01/08/2016  . Closed fracture of shaft of right femur, initial encounter ([place]) 10/25/2015  . Status post-operative repair of hip fracture 10/25/2015  . Greater trochanteric bursitis of right hip 09/29/2015  . Asymptomatic cholelithiasis 05/09/2015  . Essential hypertension 11/07/2014  . Hair loss 11/07/2014  . Bilateral leg edema 11/07/2014  . Constipation due to pain medication 11/06/2014  . Hypotension due to drugs 11/06/2014  . Chest pain 09/01/2014   . Osteoporosis 01/04/2014  . Gout 01/04/2014  . Headache 01/04/2014  . Hypothyroidism 11/19/2013  . Hypercalcemia 10/11/2013  . Insomnia 05/16/2013  . Lumbar back pain 05/16/2013  . Polycythemia vera ([place])   . Mass of right side of neck   . Senile osteoporosis   . Macular degeneration   . Sick sinus syndrome ([place]) 03/09/2012  . CAD (coronary artery disease) 12/27/2011  . Paroxysmal atrial fibrillation ([place]) 12/27/2011  . Chronic diastolic CHF (congestive heart failure) ([place]) 12/27/2011  . HTN (hypertension) 12/27/2011     Sigurd Sos, PT 10/26/17 3:20 PM  [facility] [address], [place], [place], [address] Phone: [phone]   Fax:  [phone]  Name: Sarah Mays MRN: [id number] Date of Birth: [birth year]

## 2017-10-31 ENCOUNTER — Encounter: Payer: Self-pay | Admitting: Internal Medicine

## 2017-10-31 ENCOUNTER — Ambulatory Visit (INDEPENDENT_AMBULATORY_CARE_PROVIDER_SITE_OTHER): Payer: Medicare Other | Admitting: Internal Medicine

## 2017-10-31 VITALS — BP 104/66 | HR 84 | Ht 60.0 in | Wt 103.8 lb

## 2017-10-31 DIAGNOSIS — I480 Paroxysmal atrial fibrillation: Secondary | ICD-10-CM

## 2017-10-31 DIAGNOSIS — I495 Sick sinus syndrome: Secondary | ICD-10-CM

## 2017-10-31 DIAGNOSIS — I5032 Chronic diastolic (congestive) heart failure: Secondary | ICD-10-CM | POA: Diagnosis not present

## 2017-10-31 DIAGNOSIS — I10 Essential (primary) hypertension: Secondary | ICD-10-CM | POA: Diagnosis not present

## 2017-10-31 NOTE — Patient Instructions (Addendum)
Medication Instructions:  Your physician recommends that you continue on your current medications as directed. Please refer to the Current Medication list given to you today.  Labwork: None ordered.  Testing/Procedures: None ordered.  Follow-Up: Your physician wants you to follow-up in: one year with Renee Ursuy, PA.   You will receive a reminder letter in the mail two months in advance. If you don't receive a letter, please call our office to schedule the follow-up appointment.   Any Other Special Instructions Will Be Listed Below (If Applicable).  If you need a refill on your cardiac medications before your next appointment, please call your pharmacy.   

## 2017-10-31 NOTE — Progress Notes (Signed)
PCP: Gayland Curry, DO Primary Cardiologist: Dr Aundra Dubin Primary EP: Dr Rayann Heman  Sarah Mays is a 84 y.o. female who presents today for routine electrophysiology followup.  Since last being seen in our clinic, the patient reports doing very well.  Today, she denies symptoms of palpitations, chest pain, shortness of breath,  lower extremity edema, dizziness, presyncope, or syncope.  The patient is otherwise without complaint today.   Past Medical History:  Diagnosis Date  . Abnormal stress test    a. 11/2011 Ex MV: EF 80%, small, partially reversible anteroapical defect->mild ischemia vs attenuation-->med Rx.  [name] Kitchen Alopecia, unspecified   . Arthritis   . Chronic diastolic CHF (congestive heart failure) ([place])    a. 11/2013 Echo: EF 60-65%, no rwma, mild TR, PASP 110mmHg.  . Closed fracture of lower end of radius with ulna   . Deafness    Left, s/p multiple surgeries  . Essential hypertension   . Gouty arthropathy, unspecified   . History of kidney stones   . Hypothyroidism   . Insomnia, unspecified   . Macular degeneration    Left, s/p inj. tx  . Mitral valve disorders(424.0)   . Neck mass    right, w/u including MRI negative  . PAF (paroxysmal atrial fibrillation) (HCC)    a. on amio (02/2012 mild obstruction on PFT's) and xarelto.  . Polycythemia vera(238.4)   . Pure hypercholesterolemia   . Senile osteoporosis    Reclast in the past  . Tricuspid valve disorders, specified as nonrheumatic    Past Surgical History:  Procedure Laterality Date  . BREAST BIOPSY  06/18/1998   left  . CARDIOVERSION N/A 07/15/2017   Procedure: CARDIOVERSION;  Surgeon: Larey Dresser, MD;  Location: [facility] ENDOSCOPY;  Service: Cardiovascular;  Laterality: N/A;  . ELECTROPHYSIOLOGIC STUDY N/A 01/08/2016   Procedure: Atrial Fibrillation Ablation;  Surgeon: Thompson Grayer, MD;  Location: [place] CV LAB;  Service: Cardiovascular;  Laterality: N/A;  . FEMUR IM NAIL Right 10/08/2015   Procedure:  INTRAMEDULLARY (IM) NAIL FEMORAL RIGHT;  Surgeon: Paralee Cancel, MD;  [facility];  Service: Orthopedics;  Laterality: Right;  . MASS EXCISION Right 11/26/2016   Procedure: EXCISION RIGHT LATERAL NECK MASS;  Surgeon: Jerrell Belfast, MD;  Location: [place];  Service: ENT;  Laterality: Right;  . TEE WITHOUT CARDIOVERSION N/A 07/15/2017   Procedure: TRANSESOPHAGEAL ECHOCARDIOGRAM (TEE);  Surgeon: Larey Dresser, MD;  Location: [facility] ENDOSCOPY;  Service: Cardiovascular;  Laterality: N/A;  . TONSILLECTOMY    . TOTAL ABDOMINAL HYSTERECTOMY    . TYMPANOPLASTY Bilateral     ROS- all systems are reviewed and negatives except as per HPI above  Current Outpatient Medications  Medication Sig Dispense Refill  . acetaminophen (TYLENOL) 325 MG tablet Take 2 tablets (650 mg total) by mouth every 4 (four) hours as needed for mild pain or moderate pain. Not to exceed 3g in 24h 120 tablet 0  . apixaban (ELIQUIS) 2.5 MG TABS tablet Take 1 tablet (2.5 mg total) by mouth 2 (two) times daily. 60 tablet 6  . Cholecalciferol (VITAMIN D) 2000 units CAPS Take 2,000 Units by mouth daily.     . fentaNYL (DURAGESIC - DOSED MCG/HR) 25 MCG/HR patch Place 1 patch (25 mcg total) onto the skin every 3 (three) days. 10 patch 0  . furosemide (LASIX) 40 MG tablet Take 1.5 tablets (60 mg total) by mouth 2 (two) times daily. 90 tablet 3  . gabapentin (NEURONTIN) 100 MG capsule Take 100 mg by  mouth at bedtime.    . hydrALAZINE (APRESOLINE) 50 MG tablet TAKE 1 1/2 TABLETS THREE TIMES DAILY. 135 tablet 2  . hydroxyurea (HYDREA) 500 MG capsule 500 mg on Mondays, Wednesdays and Fridays and 1000 mg the other days of the week by mouth 100 capsule 9  . levothyroxine (SYNTHROID, LEVOTHROID) 100 MCG tablet TAKE 1 TABLET ONCE DAILY BEFORE BREAKFAST. 90 tablet 1  . Magnesium 250 MG TABS Take 250 mg by mouth daily.     . Melatonin 10 MG TABS Take 10 mg by mouth at bedtime.    . methocarbamol (ROBAXIN) 500 MG tablet Take 1 tablet (500 mg  total) by mouth 2 (two) times daily as needed for muscle spasms. 6 tablet 0  . Multiple Vitamins-Minerals (PRESERVISION AREDS 2) CAPS Take 1 capsule by mouth 2 (two) times daily.     . potassium chloride (K-DUR) 10 MEQ tablet TAKE 1 TABLET BY MOUTH TWICE DAILY. 60 tablet 3  . senna-docusate (SENOKOT-S) 8.6-50 MG tablet Take 2 tablets by mouth 2 (two) times daily.    . sodium chloride (OCEAN) 0.65 % SOLN nasal spray Place 2 sprays into both nostrils as needed for congestion.    . traMADol (ULTRAM) 50 MG tablet Take 50 mg by mouth 3 (three) times daily as needed.     No current facility-administered medications for this visit.     Physical Exam: Vitals:   10/31/17 1430  BP: 104/66  Pulse: 84  SpO2: 93%  Weight: 103 lb 12.8 oz (47.1 kg)  Height: 5' (1.524 m)    GEN- The patient is well appearing, alert and oriented x 3 today.   Head- normocephalic, atraumatic Eyes-  Sclera clear, conjunctiva pink Ears- hearing intact Oropharynx- clear Lungs- Clear to ausculation bilaterally, normal work of breathing Heart- Regular rate and rhythm, no murmurs, rubs or gallops, PMI not laterally displaced GI- soft, NT, ND, + BS Extremities- no clubbing, cyanosis, or edema  Wt Readings from Last 3 Encounters:  10/31/17 103 lb 12.8 oz (47.1 kg)  10/18/17 106 lb 6.4 oz (48.3 kg)  09/07/17 105 lb (47.6 kg)    EKG tracing ordered today is personally reviewed and shows sinus rhythm with PACs, PR 238 msec, QRS 100 msec, QTc 453 msec, nonspecific ST/T changes  Assessment and Plan:  1. Paroxysmal atrial fibrillation Well controlled chads2vasc score is 4.  On eliquis.  Epistaxis has resolved  2. Sick sinus syndrome Resolved off of amiodarone  3. Chronic diastolic dysfunction Stable No change required today  4. HTN Stable No change required today  Return to see EP PA in a year  Thompson Grayer MD, [facility] 10/31/2017 2:31 PM

## 2017-11-01 DIAGNOSIS — Z79899 Other long term (current) drug therapy: Secondary | ICD-10-CM | POA: Diagnosis not present

## 2017-11-01 NOTE — Addendum Note (Signed)
Addended by: Rose Phi on: 11/01/2017 04:59 PM   Modules accepted: Orders

## 2017-11-03 ENCOUNTER — Other Ambulatory Visit (HOSPITAL_COMMUNITY): Payer: Self-pay | Admitting: Cardiology

## 2017-11-07 ENCOUNTER — Ambulatory Visit: Payer: Medicare Other

## 2017-11-07 DIAGNOSIS — G8929 Other chronic pain: Secondary | ICD-10-CM

## 2017-11-07 DIAGNOSIS — R252 Cramp and spasm: Secondary | ICD-10-CM

## 2017-11-07 DIAGNOSIS — M545 Low back pain: Secondary | ICD-10-CM

## 2017-11-07 DIAGNOSIS — M6281 Muscle weakness (generalized): Secondary | ICD-10-CM

## 2017-11-07 NOTE — Therapy (Signed)
[facility] [address] [place], [place], [address] Phone: [phone]   Fax:  [phone]  Physical Therapy Treatment  Patient Details  Name: LENNOX LEIKAM MRN: [id number] Date of Birth: 09-21-1930 Referring Provider (PT): Hulan Saas, MD   Encounter Date: 11/07/2017  PT End of Session - 11/07/17 1434    Visit Number  7    Authorization Type  Medicare/BCBS    PT Start Time  2563    PT Stop Time  1441    PT Time Calculation (min)  36 min    Activity Tolerance  Patient tolerated treatment well    Behavior During Therapy  [facility] for tasks assessed/performed       Past Medical History:  Diagnosis Date  . Abnormal stress test    a. 11/2011 Ex MV: EF 80%, small, partially reversible anteroapical defect->mild ischemia vs attenuation-->med Rx.  [name] Kitchen Alopecia, unspecified   . Arthritis   . Chronic diastolic CHF (congestive heart failure) ([place])    a. 11/2013 Echo: EF 60-65%, no rwma, mild TR, PASP 40mHg.  . Closed fracture of lower end of radius with ulna   . Deafness    Left, s/p multiple surgeries  . Essential hypertension   . Gouty arthropathy, unspecified   . History of kidney stones   . Hypothyroidism   . Insomnia, unspecified   . Macular degeneration    Left, s/p inj. tx  . Mitral valve disorders(424.0)   . Neck mass    right, w/u including MRI negative  . PAF (paroxysmal atrial fibrillation) (HCC)    a. on amio (02/2012 mild obstruction on PFT's) and xarelto.  . Polycythemia vera(238.4)   . Pure hypercholesterolemia   . Senile osteoporosis    Reclast in the past  . Tricuspid valve disorders, specified as nonrheumatic     Past Surgical History:  Procedure Laterality Date  . BREAST BIOPSY  06/18/1998   left  . CARDIOVERSION N/A 07/15/2017   Procedure: CARDIOVERSION;  Surgeon: MLarey Dresser MD;  [facility];  Service: Cardiovascular;  Laterality: N/A;  . ELECTROPHYSIOLOGIC STUDY N/A  01/08/2016   Procedure: Atrial Fibrillation Ablation;  Surgeon: JThompson Grayer MD;  [facility];  Service: Cardiovascular;  Laterality: N/A;  . FEMUR IM NAIL Right 10/08/2015   Procedure: INTRAMEDULLARY (IM) NAIL FEMORAL RIGHT;  Surgeon: MParalee Cancel MD;  [facility];  Service: Orthopedics;  Laterality: Right;  . MASS EXCISION Right 11/26/2016   Procedure: EXCISION RIGHT LATERAL NECK MASS;  Surgeon: SJerrell Belfast MD;  [place]  Service: ENT;  Laterality: Right;  . TEE WITHOUT CARDIOVERSION N/A 07/15/2017   Procedure: TRANSESOPHAGEAL ECHOCARDIOGRAM (TEE);  Surgeon: MLarey Dresser MD;  [facility];  Service: Cardiovascular;  Laterality: N/A;  . TONSILLECTOMY    . TOTAL ABDOMINAL HYSTERECTOMY    . TYMPANOPLASTY Bilateral     There were no vitals filed for this visit.  Subjective Assessment - 11/07/17 1403    Subjective  Ready for D/C.  I am not hurting as constantly as I have been and I have been doing my exercises.    Pertinent History  1 fall- 08/08/17.  Osteoporosis    Currently in Pain?  Yes    Pain Score  5     Pain Location  Back    Pain Orientation  Left;Lower    Pain Descriptors / Indicators  Aching    Pain Type  Chronic pain    Pain Onset  More than a month ago    Pain Frequency  Intermittent         [facility] PT Assessment - 11/07/17 0001      Assessment   Medical Diagnosis  back pain, unspecified back location      Cognition   Overall Cognitive Status  Within Functional Limits for tasks assessed                   [facility] Adult PT Treatment/Exercise - 11/07/17 0001      Modalities   Modalities  Moist Heat      Moist Heat Therapy   Number Minutes Moist Heat  10 Minutes    Moist Heat Location  Lumbar Spine      Manual Therapy   Manual Therapy  Soft tissue mobilization    Manual therapy comments  Lt gluteals-trigger point release       Trigger Point Dry Needling - 11/07/17 1414    Consent Given?  Yes     Muscles Treated Lower Body  Gluteus maximus;Gluteus minimus;Piriformis   Lt only   Gluteus Maximus Response  Twitch response elicited;Palpable increased muscle length    Gluteus Minimus Response  Twitch response elicited;Palpable increased muscle length    Piriformis Response  Twitch response elicited;Palpable increased muscle length           PT Education - 11/07/17 1433    Education Details  verbal review of all HEP during manual therapy    Person(s) Educated  Patient    Methods  Explanation;Handout    Comprehension  Verbalized understanding       PT Short Term Goals - 10/19/17 1448      PT SHORT TERM GOAL #1   Title  be independent in initial HEP    Status  Achieved      PT SHORT TERM GOAL #2   Title  report a 25% reduction in LBP at the end of the day    Time  4    Period  Weeks    Status  On-going        PT Long Term Goals - 11/07/17 1408      PT LONG TERM GOAL #1   Title  independent with advanced HEP to include exercises for osteoporosis    Status  Achieved      PT LONG TERM GOAL #2   Title  report a 50% reduction in LBP at the end of the day    Baseline  40% better    Status  Partially Met      PT LONG TERM GOAL #3   Title  reduce FOTO to < or = to 36% limitation    Baseline  43% limitation    Status  Partially Met            Plan - 11/07/17 1436    Clinical Impression Statement  Pt reports ~40% reduction in Lt sided lumbar pain since the start of care.  Pt with reduced tension and trigger points in Lt gluteals and lumbar paraspinals overall since the start of care.  Improved tissue mobility after dry needling and manual therapy today.  PT reviewed all HEP verbally with pt today and she reports compliance.  Pt will follow-up with MD as needed and continue to HEP.     PT Next Visit Plan  D/C PT to HEP today    Consulted and Agree with Plan of Care  Patient       Patient will benefit  from skilled therapeutic intervention in order to improve the  following deficits and impairments:     Visit Diagnosis: Cramp and spasm  Chronic bilateral low back pain without sciatica  Muscle weakness (generalized)     Problem List Patient Active Problem List   Diagnosis Date Noted  . Hydronephrosis with ureteropelvic junction (UPJ) obstruction 08/22/2017  . DDD (degenerative disc disease), cervical 08/09/2017  . Hypercoagulable state due to atrial fibrillation ([place]) 08/09/2017  . Presbycusis of both ears 08/03/2017  . Deficiency anemia 07/26/2017  . Recurrent epistaxis 02/25/2017  . Iron deficiency anemia 02/21/2017  . Pure hypercholesterolemia   . PONV (postoperative nausea and vomiting)   . PAF (paroxysmal atrial fibrillation) ([place])   . Neck mass   . Insomnia, unspecified   . History of kidney stones   . Deafness   . Closed fracture of lower end of radius with ulna   . Arthritis   . Abnormal stress test   . Rotator cuff arthropathy, right 09/08/2016  . Trigger point of right shoulder region 05/05/2016  . Spondylosis of cervical region without myelopathy or radiculopathy 05/05/2016  . Chondrocalcinosis 05/05/2016  . Chronic pain disorder 05/05/2016  . A-fib ([place]) 01/08/2016  . Closed fracture of shaft of right femur, initial encounter ([place]) 10/25/2015  . Status post-operative repair of hip fracture 10/25/2015  . Greater trochanteric bursitis of right hip 09/29/2015  . Asymptomatic cholelithiasis 05/09/2015  . Essential hypertension 11/07/2014  . Hair loss 11/07/2014  . Bilateral leg edema 11/07/2014  . Constipation due to pain medication 11/06/2014  . Hypotension due to drugs 11/06/2014  . Chest pain 09/01/2014  . Osteoporosis 01/04/2014  . Gout 01/04/2014  . Headache 01/04/2014  . Hypothyroidism 11/19/2013  . Hypercalcemia 10/11/2013  . Insomnia 05/16/2013  . Lumbar back pain 05/16/2013  . Polycythemia vera ([place])   . Mass of right side of neck   . Senile osteoporosis   . Macular degeneration   . Sick sinus syndrome  ([place]) 03/09/2012  . CAD (coronary artery disease) 12/27/2011  . Paroxysmal atrial fibrillation ([place]) 12/27/2011  . Chronic diastolic CHF (congestive heart failure) ([place]) 12/27/2011  . HTN (hypertension) 12/27/2011   PHYSICAL THERAPY DISCHARGE SUMMARY  Visits from Start of Care: 7  Current functional level related to goals / functional outcomes: See above for current status.  Pt with variable Lt sided lumbar pain of a chronic nature.  Pt has HEP in place to address flexibility and strength.     Remaining deficits: See above.    Education / Equipment: HEP Plan: Patient agrees to discharge.  Patient goals were partially met. Patient is being discharged due to being pleased with the current functional level.  ?????        Sigurd Sos, PT 11/07/17 2:37 PM  [facility] [address] [place], [place], [address] Phone: [phone]   Fax:  [phone]  Name: KARCYN MENN MRN: [id number] Date of Birth: 1931/01/05

## 2017-11-08 ENCOUNTER — Other Ambulatory Visit (HOSPITAL_COMMUNITY): Payer: Self-pay | Admitting: Cardiology

## 2017-11-08 DIAGNOSIS — H353112 Nonexudative age-related macular degeneration, right eye, intermediate dry stage: Secondary | ICD-10-CM | POA: Diagnosis not present

## 2017-11-08 DIAGNOSIS — H353222 Exudative age-related macular degeneration, left eye, with inactive choroidal neovascularization: Secondary | ICD-10-CM | POA: Diagnosis not present

## 2017-11-08 DIAGNOSIS — H353211 Exudative age-related macular degeneration, right eye, with active choroidal neovascularization: Secondary | ICD-10-CM | POA: Diagnosis not present

## 2017-11-08 DIAGNOSIS — H353124 Nonexudative age-related macular degeneration, left eye, advanced atrophic with subfoveal involvement: Secondary | ICD-10-CM | POA: Diagnosis not present

## 2017-11-09 ENCOUNTER — Other Ambulatory Visit: Payer: Self-pay | Admitting: *Deleted

## 2017-11-09 ENCOUNTER — Telehealth: Payer: Self-pay | Admitting: Hematology and Oncology

## 2017-11-09 ENCOUNTER — Ambulatory Visit: Payer: Self-pay

## 2017-11-09 MED ORDER — FENTANYL 25 MCG/HR TD PT72: 25.0000 ug | MEDICATED_PATCH | TRANSDERMAL | 0 refills | Status: DC

## 2017-11-09 NOTE — Telephone Encounter (Signed)
Patient requested refill NCCSRS Database Verified LR: 10/11/2017 Pharmacy Confirmed Pended Rx and sent to Dr. Mariea Clonts for approval.

## 2017-11-09 NOTE — Telephone Encounter (Signed)
Patient called to reschedule  °

## 2017-11-10 ENCOUNTER — Inpatient Hospital Stay: Payer: Medicare Other | Admitting: Hematology and Oncology

## 2017-11-10 ENCOUNTER — Inpatient Hospital Stay: Payer: Medicare Other

## 2017-11-14 ENCOUNTER — Telehealth (HOSPITAL_COMMUNITY): Payer: Self-pay

## 2017-11-14 NOTE — Telephone Encounter (Signed)
Pt called to state that her feet are still swelling. Pt is currently on lasix 40 mg, taking (80 mg) daily alternating 80 qam/40 qpm which is not helping with her edema. I advised pt to take 80 mg twice a day for 2 days. Pt states she will call back if that does not work.

## 2017-11-17 ENCOUNTER — Encounter: Payer: Self-pay | Admitting: Hematology and Oncology

## 2017-11-17 ENCOUNTER — Telehealth: Payer: Self-pay | Admitting: Hematology and Oncology

## 2017-11-17 ENCOUNTER — Inpatient Hospital Stay (HOSPITAL_BASED_OUTPATIENT_CLINIC_OR_DEPARTMENT_OTHER): Payer: Medicare Other | Admitting: Hematology and Oncology

## 2017-11-17 ENCOUNTER — Inpatient Hospital Stay: Payer: Medicare Other | Attending: Hematology and Oncology

## 2017-11-17 DIAGNOSIS — Z79899 Other long term (current) drug therapy: Secondary | ICD-10-CM

## 2017-11-17 DIAGNOSIS — D539 Nutritional anemia, unspecified: Secondary | ICD-10-CM

## 2017-11-17 DIAGNOSIS — D45 Polycythemia vera: Secondary | ICD-10-CM | POA: Insufficient documentation

## 2017-11-17 DIAGNOSIS — D500 Iron deficiency anemia secondary to blood loss (chronic): Secondary | ICD-10-CM

## 2017-11-17 DIAGNOSIS — Z7901 Long term (current) use of anticoagulants: Secondary | ICD-10-CM | POA: Diagnosis not present

## 2017-11-17 LAB — CBC WITH DIFFERENTIAL/PLATELET
ABS IMMATURE GRANULOCYTES: 0.03 10*3/uL (ref 0.00–0.07)
BASOS ABS: 0.1 10*3/uL (ref 0.0–0.1)
Basophils Relative: 1 %
Eosinophils Absolute: 0.1 10*3/uL (ref 0.0–0.5)
Eosinophils Relative: 1 %
HCT: 37.2 % (ref 36.0–46.0)
HEMOGLOBIN: 12.2 g/dL (ref 12.0–15.0)
IMMATURE GRANULOCYTES: 0 %
LYMPHS PCT: 18 %
Lymphs Abs: 1.4 10*3/uL (ref 0.7–4.0)
MCH: 36.6 pg — ABNORMAL HIGH (ref 26.0–34.0)
MCHC: 32.8 g/dL (ref 30.0–36.0)
MCV: 111.7 fL — ABNORMAL HIGH (ref 80.0–100.0)
Monocytes Absolute: 0.7 10*3/uL (ref 0.1–1.0)
Monocytes Relative: 9 %
NEUTROS PCT: 71 %
Neutro Abs: 5.5 10*3/uL (ref 1.7–7.7)
PLATELETS: 458 10*3/uL — AB (ref 150–400)
RBC: 3.33 MIL/uL — AB (ref 3.87–5.11)
RDW: 15.3 % (ref 11.5–15.5)
WBC: 7.7 10*3/uL (ref 4.0–10.5)
nRBC: 0 % (ref 0.0–0.2)

## 2017-11-17 LAB — IRON AND TIBC
Iron: 92 ug/dL (ref 41–142)
Saturation Ratios: 29 % (ref 21–57)
TIBC: 316 ug/dL (ref 236–444)
UIBC: 225 ug/dL

## 2017-11-17 LAB — FERRITIN: Ferritin: 62 ng/mL (ref 11–307)

## 2017-11-17 LAB — SEDIMENTATION RATE: Sed Rate: 9 mm/hr (ref 0–22)

## 2017-11-17 LAB — VITAMIN B12: VITAMIN B 12: 332 pg/mL (ref 180–914)

## 2017-11-17 NOTE — Progress Notes (Signed)
[facility] OFFICE PROGRESS NOTE  Patient Care Team: Gayland Curry, DO as PCP - General (Geriatric Medicine) Larey Dresser, MD as PCP - Cardiology (Cardiology) Community, [facility], Clent Demark, MD as Consulting Physician (Ophthalmology) Larey Dresser, MD as Consulting Physician (Cardiology) Heath Lark, MD as Consulting Physician (Hematology and Oncology) Clent Jacks, MD as Consulting Physician (Ophthalmology) Jerrell Belfast, MD as Consulting Physician (Otolaryngology)  ASSESSMENT & PLAN:  Polycythemia vera Her CBC is stable The patient stated she has been compliant taking medications as directed She would take Hydroxyurea 500 mg on Mondays, Wednesdays and Fridays and to take 1000 mg for the rest of the week I suspect a mild elevated platelet count could be due to component of iron deficiency I do not plan to adjust the dose of her treatment Plan to see her back in 4 months for further follow-up She is educated to watch out for signs and symptoms of bleeding or infection   No orders of the defined types were placed in this encounter.   INTERVAL HISTORY: Please see below for problem oriented charting. She returns for further follow-up She is doing well She has no further bleeding complications since the last time she was seen No recent infection, fever or chills No recent falls. She denies recent chest pain or shortness of breath.  She is compliant taking hydroxyurea as directed  SUMMARY OF ONCOLOGIC HISTORY:   Polycythemia vera ([place])   08/05/2011 Pathology Results    Peripheral blood JAK2 mutation was positive with low serum erythropoietin level. Bone marrow aspirate and biopsy was not performed.    08/12/2011 -  Chemotherapy    She is started on hydroxyurea with periodic phlebotomy.     REVIEW OF SYSTEMS:   Constitutional: Denies fevers, chills or abnormal weight loss Eyes: Denies blurriness of vision Ears, nose, mouth, throat,  and face: Denies mucositis or sore throat Respiratory: Denies cough, dyspnea or wheezes Cardiovascular: Denies palpitation, chest discomfort or lower extremity swelling Gastrointestinal:  Denies nausea, heartburn or change in bowel habits Skin: Denies abnormal skin rashes Lymphatics: Denies new lymphadenopathy or easy bruising Neurological:Denies numbness, tingling or new weaknesses Behavioral/Psych: Mood is stable, no new changes  All other systems were reviewed with the patient and are negative.  I have reviewed the past medical history, past surgical history, social history and family history with the patient and they are unchanged from previous note.  ALLERGIES:  is allergic to amlodipine.  MEDICATIONS:  Current Outpatient Medications  Medication Sig Dispense Refill  . acetaminophen (TYLENOL) 325 MG tablet Take 2 tablets (650 mg total) by mouth every 4 (four) hours as needed for mild pain or moderate pain. Not to exceed 3g in 24h 120 tablet 0  . apixaban (ELIQUIS) 2.5 MG TABS tablet Take 1 tablet (2.5 mg total) by mouth 2 (two) times daily. 60 tablet 6  . Cholecalciferol (VITAMIN D) 2000 units CAPS Take 2,000 Units by mouth daily.     . fentaNYL (DURAGESIC - DOSED MCG/HR) 25 MCG/HR patch Place 1 patch (25 mcg total) onto the skin every 3 (three) days. 10 patch 0  . furosemide (LASIX) 40 MG tablet Take 1.5 tablets (60 mg total) by mouth 2 (two) times daily. 90 tablet 3  . gabapentin (NEURONTIN) 100 MG capsule Take 100 mg by mouth at bedtime.    . hydrALAZINE (APRESOLINE) 50 MG tablet TAKE 1 1/2 TABLETS THREE TIMES DAILY. 135 tablet 11  . hydroxyurea (HYDREA) 500 MG capsule 500  mg on Mondays, Wednesdays and Fridays and 1000 mg the other days of the week by mouth 100 capsule 9  . levothyroxine (SYNTHROID, LEVOTHROID) 100 MCG tablet TAKE 1 TABLET ONCE DAILY BEFORE BREAKFAST. 90 tablet 1  . Magnesium 250 MG TABS Take 250 mg by mouth daily.     . Melatonin 10 MG TABS Take 10 mg by mouth at  bedtime.    . methocarbamol (ROBAXIN) 500 MG tablet Take 1 tablet (500 mg total) by mouth 2 (two) times daily as needed for muscle spasms. 6 tablet 0  . Multiple Vitamins-Minerals (PRESERVISION AREDS 2) CAPS Take 1 capsule by mouth 2 (two) times daily.     . potassium chloride (K-DUR) 10 MEQ tablet TAKE 1 TABLET BY MOUTH TWICE DAILY. 60 tablet 3  . senna-docusate (SENOKOT-S) 8.6-50 MG tablet Take 2 tablets by mouth 2 (two) times daily.    . sodium chloride (OCEAN) 0.65 % SOLN nasal spray Place 2 sprays into both nostrils as needed for congestion.    . traMADol (ULTRAM) 50 MG tablet Take 50 mg by mouth 3 (three) times daily as needed.     No current facility-administered medications for this visit.     PHYSICAL EXAMINATION: ECOG PERFORMANCE STATUS: 1 - Symptomatic but completely ambulatory  Vitals:   11/17/17 1359  BP: 117/66  Pulse: (!) 57  Resp: 17  Temp: (!) 97.5 F (36.4 C)  SpO2: 98%   Filed Weights   11/17/17 1359  Weight: 106 lb (48.1 kg)    GENERAL:alert, no distress and comfortable SKIN: skin color, texture, turgor are normal, no rashes or significant lesions  NEURO: alert & oriented x 3 with fluent speech, no focal motor/sensory deficits  LABORATORY DATA:  I have reviewed the data as listed    Component Value Date/Time   NA 140 10/18/2017 1621   NA 136 (A) 08/10/2017   NA 136 09/14/2016 1428   K 3.5 10/18/2017 1621   K 4.0 09/14/2016 1428   CL 100 10/18/2017 1621   CO2 31 10/18/2017 1621   CO2 30 (H) 09/14/2016 1428   GLUCOSE 64 (L) 10/18/2017 1621   GLUCOSE 112 09/14/2016 1428   BUN 18 10/18/2017 1621   BUN 20 08/10/2017   BUN 14.7 09/14/2016 1428   CREATININE 0.65 10/18/2017 1621   CREATININE 0.8 09/14/2016 1428   CALCIUM 11.0 (H) 10/18/2017 1621   CALCIUM 11.0 (H) 09/14/2016 1428   PROT 5.5 (L) 07/26/2017 0954   PROT 7.0 09/14/2016 1428   ALBUMIN 3.8 07/26/2017 0954   ALBUMIN 4.6 09/14/2016 1428   AST 23 07/28/2017 0800   AST 17 09/14/2016 1428    ALT 30 07/28/2017 0800   ALT 18 09/14/2016 1428   ALKPHOS 62 07/28/2017 0800   ALKPHOS 62 09/14/2016 1428   BILITOT 0.5 07/26/2017 0954   BILITOT 1.00 09/14/2016 1428   GFRNONAA >60 10/18/2017 1621   GFRAA >60 10/18/2017 1621    No results found for: SPEP, UPEP  Lab Results  Component Value Date   WBC 7.7 11/17/2017   NEUTROABS 5.5 11/17/2017   HGB 12.2 11/17/2017   HCT 37.2 11/17/2017   MCV 111.7 (H) 11/17/2017   PLT 458 (H) 11/17/2017      Chemistry      Component Value Date/Time   NA 140 10/18/2017 1621   NA 136 (A) 08/10/2017   NA 136 09/14/2016 1428   K 3.5 10/18/2017 1621   K 4.0 09/14/2016 1428   CL 100 10/18/2017 1621  CO2 31 10/18/2017 1621   CO2 30 (H) 09/14/2016 1428   BUN 18 10/18/2017 1621   BUN 20 08/10/2017   BUN 14.7 09/14/2016 1428   CREATININE 0.65 10/18/2017 1621   CREATININE 0.8 09/14/2016 1428   GLU 76 08/10/2017      Component Value Date/Time   CALCIUM 11.0 (H) 10/18/2017 1621   CALCIUM 11.0 (H) 09/14/2016 1428   ALKPHOS 62 07/28/2017 0800   ALKPHOS 62 09/14/2016 1428   AST 23 07/28/2017 0800   AST 17 09/14/2016 1428   ALT 30 07/28/2017 0800   ALT 18 09/14/2016 1428   BILITOT 0.5 07/26/2017 0954   BILITOT 1.00 09/14/2016 1428     All questions were answered. The patient knows to call the clinic with any problems, questions or concerns. No barriers to learning was detected.  I spent 10 minutes counseling the patient face to face. The total time spent in the appointment was 15 minutes and more than 50% was on counseling and review of test results  Heath Lark, MD 11/17/2017 2:11 PM

## 2017-11-17 NOTE — Assessment & Plan Note (Signed)
Her CBC is stable The patient stated she has been compliant taking medications as directed She would take Hydroxyurea 500 mg on Mondays, Wednesdays and Fridays and to take 1000 mg for the rest of the week I suspect a mild elevated platelet count could be due to component of iron deficiency I do not plan to adjust the dose of her treatment Plan to see her back in 4 months for further follow-up She is educated to watch out for signs and symptoms of bleeding or infection

## 2017-11-17 NOTE — Telephone Encounter (Signed)
Gave pt avs and calendar  °

## 2017-11-24 ENCOUNTER — Encounter: Payer: Self-pay | Admitting: Podiatry

## 2017-11-24 ENCOUNTER — Ambulatory Visit (INDEPENDENT_AMBULATORY_CARE_PROVIDER_SITE_OTHER): Payer: Medicare Other | Admitting: Podiatry

## 2017-11-24 VITALS — BP 118/65 | HR 66 | Resp 16

## 2017-11-24 DIAGNOSIS — L600 Ingrowing nail: Secondary | ICD-10-CM | POA: Diagnosis not present

## 2017-11-24 MED ORDER — NEOMYCIN-POLYMYXIN-HC 3.5-10000-1 OT SOLN: OTIC | 0 refills | Status: DC

## 2017-11-24 NOTE — Progress Notes (Signed)
   Subjective:    Patient ID: Sarah Mays, female    DOB: 02/18/1930, 84 y.o.   MRN: [id number]  HPI    Review of Systems  HENT: Positive for nosebleeds.   All other systems reviewed and are negative.      Objective:   Physical Exam        Assessment & Plan:

## 2017-11-24 NOTE — Patient Instructions (Signed)

## 2017-11-25 ENCOUNTER — Telehealth: Payer: Self-pay | Admitting: *Deleted

## 2017-11-25 ENCOUNTER — Telehealth (HOSPITAL_COMMUNITY): Payer: Self-pay

## 2017-11-25 NOTE — Telephone Encounter (Signed)
Pt states her instructions state soak twice a day and she would swear Dr. Paulla Dolly said once a day.

## 2017-11-25 NOTE — Telephone Encounter (Signed)
Pt notified. Pt states that she does not have a way to weigh herself but stated that her clothes still fit the same. Pt also states that she cannot wear compression stockings at this time due to having her toenail worked on. Pt will increase lasix for 2 days and will call back next week if no change in her edema.

## 2017-11-25 NOTE — Telephone Encounter (Signed)
I informed pt for the 1st 2 weeks after the toenail procedure she should soak 2 time daily and apply the drops, but once it begins to dry she can begin soaking once daily and allow to air dry when going to bed. Pt states understanding.

## 2017-11-25 NOTE — Telephone Encounter (Signed)
Are her weights changing at all? Have her elevate her legs and wear compression stockings. She can try 80 mg BID x 2 days. Thanks.

## 2017-11-25 NOTE — Progress Notes (Signed)
Subjective:   Patient ID: Sarah Mays, female   DOB: 84 y.o.   MRN: [id number]   HPI Patient presents stating she is had a painful ingrown toenail on her right big toe is been hurting her for several months and she is tried to soak it trim it without relief and she needs something done to get her out of pain.  Patient does take a blood thinner but does not bruise badly and patient does not smoke and likes to be active  Review of Systems  All other systems reviewed and are negative.       Objective:  Physical Exam  Constitutional: She appears well-developed and well-nourished.  Cardiovascular: Intact distal pulses.  Pulmonary/Chest: Effort normal.  Musculoskeletal: Normal range of motion.  Neurological: She is alert.  Skin: Skin is warm.  Nursing note and vitals reviewed.   Neurovascular status was found to be intact with muscle strength adequate range of motion within normal limits.  I did note good digital perfusion and patient is well oriented in the medial border of the right hallux is incurvated in the corner and its tender when pressed make shoe gear difficult.  Patient does not have any current drainage associated with it or redness     Assessment:  Ingrown toenail deformity with quite a bit of pain right hallux heel border localized in nature     Plan:  H&P condition reviewed and recommended removal of the nail border.  I explained procedure and risk associated with that and she is willing to accept this and signed consent form.  I infiltrated the right hallux 60 mg like Marcaine mixture sterile prep applied and I then went ahead and removed the medial border exposed matrix and applied phenol 3 applications 30 seconds followed by alcohol lavage and sterile dressing.  Gave instructions on soaks and reappoint to check and advised on leaving dressing on 24 hours but if it should start to throb to take it off immediately.  Patient is encouraged to call with any questions  concerns

## 2017-11-25 NOTE — Telephone Encounter (Signed)
Pt called to state that her edema in her feet is no better all though we increase her lasix. Pt is currently taking 1.5 tab (60 mg) by mouth 2 times daily. Pt is concerned due to the weather getting colder and she can only wear her sandals. Please advise.

## 2017-11-29 ENCOUNTER — Other Ambulatory Visit: Payer: Self-pay | Admitting: Internal Medicine

## 2017-11-30 ENCOUNTER — Encounter: Payer: Self-pay | Admitting: Internal Medicine

## 2017-11-30 ENCOUNTER — Non-Acute Institutional Stay: Payer: Medicare Other | Admitting: Internal Medicine

## 2017-11-30 VITALS — BP 120/60 | HR 90 | Temp 98.3°F | Ht 60.0 in | Wt 106.0 lb

## 2017-11-30 DIAGNOSIS — K5903 Drug induced constipation: Secondary | ICD-10-CM | POA: Diagnosis not present

## 2017-11-30 DIAGNOSIS — Q6211 Congenital occlusion of ureteropelvic junction: Secondary | ICD-10-CM | POA: Diagnosis not present

## 2017-11-30 DIAGNOSIS — M545 Low back pain, unspecified: Secondary | ICD-10-CM

## 2017-11-30 DIAGNOSIS — I480 Paroxysmal atrial fibrillation: Secondary | ICD-10-CM

## 2017-11-30 DIAGNOSIS — Z7189 Other specified counseling: Secondary | ICD-10-CM

## 2017-11-30 MED ORDER — NALOXEGOL OXALATE 25 MG PO TABS: 25.0000 mg | ORAL_TABLET | Freq: Every day | ORAL | 3 refills | Status: DC

## 2017-11-30 NOTE — Progress Notes (Signed)
Location:  Occupational psychologist of Service:  Clinic (12)  Provider: Chyrel Taha L. Mariea Clonts, D.O., C.M.D.  Code Status: DNR Goals of Care:  Advanced Directives 09/21/2017  Does Patient Have a Medical Advance Directive? Yes  Type of Paramedic of [place];Living will;Out of facility DNR (pink MOST or yellow form)  Does patient want to make changes to medical advance directive? No - Patient declined  Copy of [place] in Chart? Yes  Would patient like information on creating a medical advance directive? -  Pre-existing out of facility DNR order (yellow form or pink MOST form) -     Chief Complaint  Patient presents with  . Medical Management of Chronic Issues    9mth follow-up    HPI: Patient is a 84 y.o. female seen today for medical management of chronic diseases.    Needs her hearing reassessed.  Back pain is worse.  Has her ablation tomorrow.  Sometimes it's pretty bad, sometimes it's not.  Not specific to what she's doing.    Not having any cardiac symptoms.    Constipation continues to be an issue.  We talked about a potential prescription.  Uses fibercon, stool softeners bid, eats veggies like mad and drinks water.  She might even have two leafy greens instead of one.  She even had some problem before her pain pills.  She may get gas cramps.  Has been doing a lot of suppositories.  Sometimes even MOM is not doing it.  Junie Panning suggested prune juice combined with MOM but her nauseates her.    Getting flu shot Friday.  Past Medical History:  Diagnosis Date  . Abnormal stress test    a. 11/2011 Ex MV: EF 80%, small, partially reversible anteroapical defect->mild ischemia vs attenuation-->med Rx.  [name] Kitchen Alopecia, unspecified   . Arthritis   . Chronic diastolic CHF (congestive heart failure) ([place])    a. 11/2013 Echo: EF 60-65%, no rwma, mild TR, PASP 82mmHg.  . Closed fracture of lower end of radius with ulna   . Deafness      Left, s/p multiple surgeries  . Essential hypertension   . Gouty arthropathy, unspecified   . History of kidney stones   . Hypothyroidism   . Insomnia, unspecified   . Macular degeneration    Left, s/p inj. tx  . Mitral valve disorders(424.0)   . Neck mass    right, w/u including MRI negative  . PAF (paroxysmal atrial fibrillation) (HCC)    a. on amio (02/2012 mild obstruction on PFT's) and xarelto.  . Polycythemia vera(238.4)   . Pure hypercholesterolemia   . Senile osteoporosis    Reclast in the past  . Tricuspid valve disorders, specified as nonrheumatic     Past Surgical History:  Procedure Laterality Date  . BREAST BIOPSY  06/18/1998   left  . CARDIOVERSION N/A 07/15/2017   Procedure: CARDIOVERSION;  Surgeon: Larey Dresser, MD;  Location[facility] ENDOSCOPY;  Service: Cardiovascular;  Laterality: N/A;  . ELECTROPHYSIOLOGIC STUDY N/A 01/08/2016   Procedure: Atrial Fibrillation Ablation;  Surgeon: Thompson Grayer, MD;  Location: [facility] CV LAB;  Service: Cardiovascular;  Laterality: N/A;  . FEMUR IM NAIL Right 10/08/2015   Procedure: INTRAMEDULLARY (IM) NAIL FEMORAL RIGHT;  Surgeon: Paralee Cancel, MD;  Location: [facility] ORS;  Service: Orthopedics;  Laterality: Right;  . MASS EXCISION Right 11/26/2016   Procedure: EXCISION RIGHT LATERAL NECK MASS;  Surgeon: Jerrell Belfast, MD;  Location: [facility];  Service: ENT;  Laterality: Right;  . TEE WITHOUT CARDIOVERSION N/A 07/15/2017   Procedure: TRANSESOPHAGEAL ECHOCARDIOGRAM (TEE);  Surgeon: Larey Dresser, MD;  Location: [facility] ENDOSCOPY;  Service: Cardiovascular;  Laterality: N/A;  . TONSILLECTOMY    . TOTAL ABDOMINAL HYSTERECTOMY    . TYMPANOPLASTY Bilateral     Allergies  Allergen Reactions  . Amlodipine Swelling and Other (See Comments)    Reaction:  Lower extremity swelling     Outpatient Encounter Medications as of 11/30/2017  Medication Sig  . acetaminophen (TYLENOL) 325 MG tablet Take 2 tablets (650 mg total) by mouth every 4  (four) hours as needed for mild pain or moderate pain. Not to exceed 3g in 24h  . apixaban (ELIQUIS) 2.5 MG TABS tablet Take 1 tablet (2.5 mg total) by mouth 2 (two) times daily.  . Cholecalciferol (VITAMIN D) 2000 units CAPS Take 2,000 Units by mouth daily.   . fentaNYL (DURAGESIC - DOSED MCG/HR) 25 MCG/HR patch Place 1 patch (25 mcg total) onto the skin every 3 (three) days.  . furosemide (LASIX) 40 MG tablet Take 1.5 tablets (60 mg total) by mouth 2 (two) times daily.  [name] Kitchen gabapentin (NEURONTIN) 100 MG capsule Take 100 mg by mouth at bedtime.  . hydrALAZINE (APRESOLINE) 50 MG tablet TAKE 1 1/2 TABLETS THREE TIMES DAILY.  . hydroxyurea (HYDREA) 500 MG capsule 500 mg on Mondays, Wednesdays and Fridays and 1000 mg the other days of the week by mouth  . levothyroxine (SYNTHROID, LEVOTHROID) 100 MCG tablet TAKE 1 TABLET ONCE DAILY BEFORE BREAKFAST.  . Magnesium 250 MG TABS Take 250 mg by mouth daily.   . Melatonin 10 MG TABS Take 10 mg by mouth at bedtime.  . Multiple Vitamins-Minerals (PRESERVISION AREDS 2) CAPS Take 1 capsule by mouth 2 (two) times daily.   [name] Kitchen neomycin-polymyxin-hydrocortisone (CORTISPORIN) OTIC solution Apply 1-2 drops to toe after soaking twice a day  . potassium chloride (K-DUR) 10 MEQ tablet TAKE 1 TABLET BY MOUTH TWICE DAILY.  [name] Kitchen senna-docusate (SENOKOT-S) 8.6-50 MG tablet Take 2 tablets by mouth 2 (two) times daily.  . sodium chloride (OCEAN) 0.65 % SOLN nasal spray Place 2 sprays into both nostrils as needed for congestion.  . traMADol (ULTRAM) 50 MG tablet Take 50 mg by mouth 3 (three) times daily as needed.  . [DISCONTINUED] methocarbamol (ROBAXIN) 500 MG tablet Take 1 tablet (500 mg total) by mouth 2 (two) times daily as needed for muscle spasms.   No facility-administered encounter medications on file as of 11/30/2017.     Review of Systems:  Review of Systems  Constitutional: Negative for chills and fever.  HENT: Positive for hearing loss.        Reports worsening,  but no time to get it checked b/c she's busy going to other appts  Eyes: Negative for blurred vision.  Respiratory: Negative for cough and shortness of breath.   Cardiovascular: Negative for chest pain, palpitations and leg swelling.  Gastrointestinal: Positive for constipation. Negative for abdominal pain, blood in stool, diarrhea and melena.  Genitourinary: Negative for dysuria.  Musculoskeletal: Positive for back pain. Negative for falls and joint pain.  Skin: Negative for itching and rash.  Neurological: Negative for dizziness and loss of consciousness.  Endo/Heme/Allergies: Bruises/bleeds easily.  Psychiatric/Behavioral: Negative for depression and memory loss. The patient is not nervous/anxious and does not have insomnia.     Health Maintenance  Topic Date Due  . INFLUENZA VACCINE  09/08/2017  . TETANUS/TDAP  01/13/2027  . DEXA SCAN  Completed  . PNA vac Low Risk Adult  Completed    Physical Exam: Vitals:   11/30/17 1357  BP: 120/60  Pulse: 90  Temp: 98.3 F (36.8 C)  TempSrc: Oral  SpO2: 97%  Weight: 106 lb (48.1 kg)  Height: 5' (1.524 m)   Body mass index is 20.7 kg/m. Physical Exam  Constitutional: She is oriented to person, place, and time. She appears well-developed and well-nourished. No distress.  HENT:  HOH  Cardiovascular: Intact distal pulses.  irreg irreg  Pulmonary/Chest: Effort normal and breath sounds normal. No respiratory distress.  Abdominal: Bowel sounds are normal.  Musculoskeletal: Normal range of motion.  Walks with walker  Neurological: She is alert and oriented to person, place, and time.  Skin: Skin is warm and dry. Capillary refill takes less than 2 seconds.  Psychiatric: She has a normal mood and affect.    Labs reviewed: Basic Metabolic Panel: Recent Labs    12/27/16 1427  07/07/17 1528 07/26/17 0954 07/28/17 08/10/17 10/18/17 1621  NA  --    < > 138 140 141 136* 140  K  --    < > 3.8 4.2 5.0 4.5 3.5  CL  --    < > 102 102   --   --  100  CO2  --    < > 30 32  --   --  31  GLUCOSE  --    < > 88 98  --   --  64*  BUN  --    < > 16 37* 38* 20 18  CREATININE  --    < > 0.82 0.83 0.7 0.5 0.65  CALCIUM  --    < > 10.7* 10.9*  --   --  11.0*  TSH 0.194*  --   --   --  1.37  --   --    < > = values in this interval not displayed.   Liver Function Tests: Recent Labs    02/07/17 2305 07/26/17 0954 07/28/17 0800  AST 21 24 23   ALT 16 30 30   ALKPHOS 59 47 62  BILITOT 0.9 0.5  --   PROT 6.6 5.5*  --   ALBUMIN 4.7 3.8  --    No results for input(s): LIPASE, AMYLASE in the last 8760 hours. No results for input(s): AMMONIA in the last 8760 hours. CBC: Recent Labs    07/26/17 0954  07/29/17 1305 08/10/17 0135 11/17/17 1327  WBC 11.8*   < > 13.2* 9.4 7.7  NEUTROABS 10.0*  --  11.0*  --  5.5  HGB 11.6*   < > 12.4 12.1 12.2  HCT 36.4   < > 38.2 36.8 37.2  MCV 112.3*  --  113.4* 112.5* 111.7*  PLT 523*   < > 543* 358 458*   < > = values in this interval not displayed.   Lipid Panel: Recent Labs    07/28/17  CHOL 194  HDL 72*  LDLCALC 102  TRIG 99   No results found for: HGBA1C  Procedures since last visit: No results found.  Assessment/Plan 1. Constipation due to pain medication - has gotten considerably worse lately and has needed her pain meds for her chronic back problems which wax and wane and we adjust her fentanyl dose accordingly and she gets injections and now ablations which sometimes work - OTC and nutritional approaches are failing - will try opioid-induced constipation medication for her - naloxegol oxalate (MOVANTIK) 25 MG TABS tablet;  Take 1 tablet (25 mg total) by mouth daily.  Dispense: 30 tablet; Refill: 3  2. Lumbar back pain -for ablation tomorrow and short stay in rehab for observation  3. Paroxysmal atrial fibrillation (HCC) -seems to be back in afib today but unaware -rate is controlled, on eliquis anticoagulation  4. Hydronephrosis with ureteropelvic junction (UPJ)  obstruction -no symptoms have developed, doing fine (had this noted when in rehab)  5.  ACP:  DNR reentered per her wishes and [place] as she was hospitalized once and full code was entered at that time  Labs/tests ordered:  No new Next appt:  F/u 06/08/18   Jeana Kersting L. Brentton Wardlow, D.O. [facility] [address], [place] [address] Cell Phone (Mon-Fri 8am-5pm):  [phone] On Call:  [phone] & follow prompts after 5pm & weekends Office Phone:  [phone] Office Fax:  [phone]

## 2017-12-01 DIAGNOSIS — M47816 Spondylosis without myelopathy or radiculopathy, lumbar region: Secondary | ICD-10-CM | POA: Diagnosis not present

## 2017-12-05 ENCOUNTER — Telehealth: Payer: Self-pay

## 2017-12-05 NOTE — Telephone Encounter (Signed)
Incoming fax received from [facility] stating  Sarah Mays is a non-formulary medication call [phone]  I initiated PA through [employer] Number [id number]  Key Number: [id number]   Awaiting Response from Insurance Carrier

## 2017-12-06 ENCOUNTER — Telehealth: Payer: Self-pay | Admitting: Podiatry

## 2017-12-06 NOTE — Telephone Encounter (Signed)
I called pt and informed she should soak the toe for 20 minutes twice daily and apply antibiotic drops with a bandaid for about 4-6 weeks until the area got a dry hard scab without redness, swelling or drainage. I told pt that she could test to see if the area developed a dry hard scab about the end of the 3rd week, by performing the last soak of the the day and if the area got a dry hard scab without redness, swelling or drainage she could stop the soaks and drops.

## 2017-12-06 NOTE — Telephone Encounter (Signed)
Dr. Paulla Dolly did a ingrown procedure on me. I wanted to know how long do I continue to soak in epsom salt? How long do I continue to use the drops? I've misplaced the piece of paper I was given. Please call me back at [phone]. Thank you.

## 2017-12-06 NOTE — Telephone Encounter (Signed)
I checked status of PA through CoverMyMeds and received the following notifcation  This request has received a Cancelled outcome. This may mean either your patient does not have active coverage with this plan, this authorization was processed as a duplicate request, or an authorization was not needed for this medication. Note any additional information provided by Sherre Poot P[place]an at the bottom of this request, and contact [employer] [place] directly for further

## 2017-12-07 NOTE — Telephone Encounter (Signed)
Left message on voicemail for [facility], I requested that they check patient's account and see if any action is required on our end to process refill request for Movantik. I informed pharmacist PA was initiated after receiving fax from them I advised of response I got from Covermymeds.  S.Chrae B/CMA

## 2017-12-08 ENCOUNTER — Telehealth: Payer: Self-pay | Admitting: *Deleted

## 2017-12-08 DIAGNOSIS — Z23 Encounter for immunization: Secondary | ICD-10-CM | POA: Diagnosis not present

## 2017-12-08 DIAGNOSIS — K5903 Drug induced constipation: Secondary | ICD-10-CM

## 2017-12-08 NOTE — Telephone Encounter (Signed)
Received call from [facility] stating patient is requiring a prior authorization for Movantik. Initiated through [employer]. IDU:[id number] OE:[id number] Went into determination.

## 2017-12-09 ENCOUNTER — Other Ambulatory Visit: Payer: Self-pay | Admitting: *Deleted

## 2017-12-09 MED ORDER — FENTANYL 25 MCG/HR TD PT72: 25.0000 ug | MEDICATED_PATCH | TRANSDERMAL | 0 refills | Status: DC

## 2017-12-09 NOTE — Telephone Encounter (Signed)
Database checked and verified Last filled 11/09/17

## 2017-12-13 DIAGNOSIS — H353124 Nonexudative age-related macular degeneration, left eye, advanced atrophic with subfoveal involvement: Secondary | ICD-10-CM | POA: Diagnosis not present

## 2017-12-13 DIAGNOSIS — H353112 Nonexudative age-related macular degeneration, right eye, intermediate dry stage: Secondary | ICD-10-CM | POA: Diagnosis not present

## 2017-12-13 DIAGNOSIS — H353211 Exudative age-related macular degeneration, right eye, with active choroidal neovascularization: Secondary | ICD-10-CM | POA: Diagnosis not present

## 2017-12-13 DIAGNOSIS — H353222 Exudative age-related macular degeneration, left eye, with inactive choroidal neovascularization: Secondary | ICD-10-CM | POA: Diagnosis not present

## 2017-12-13 NOTE — Telephone Encounter (Signed)
Refer to phone note dated 12/08/17

## 2017-12-13 NOTE — Telephone Encounter (Addendum)
Sent another prior auth to cover my meds with correct insurance info. Which was cancelled due to it being a duplicate request   "This request has received a Cancelled outcome.  This may mean either your patient does not have active coverage with this plan, this authorization was processed as a duplicate request, or an authorization was not needed for this medication.  Note any additional information provided by [facility] Garey at the bottom of this request, and contact [employer] [place] directly for further"

## 2017-12-15 ENCOUNTER — Telehealth (HOSPITAL_COMMUNITY): Payer: Self-pay

## 2017-12-15 NOTE — Telephone Encounter (Signed)
Pt called stating she has edema in her feet. I advised her to increase her furosemide to 80 mg twice a day for 2 days (from Oct note). Pt also states that she does this every time she has edema and it will help and then feet swells again. Pt cannot wear compression stockings due to toenail issues as she is soaking her foot twice a day. Patient has not being weighing herself. Please advise.

## 2017-12-15 NOTE — Telephone Encounter (Signed)
Please ask her to weigh daily. She can increase her lasix to 80 mg am, 40 mg pm every day (currently afternoon dose is only every other day). She needs to get a BMET in 1 week to check on her kidneys and potassium level. If she becomes dizzy, she needs to cut the afternoon dose back to every other day.

## 2017-12-15 NOTE — Telephone Encounter (Signed)
Pt notified Verbalizes understanding 

## 2017-12-21 ENCOUNTER — Other Ambulatory Visit: Payer: Self-pay | Admitting: Internal Medicine

## 2017-12-21 MED ORDER — LUBIPROSTONE 24 MCG PO CAPS: 24.0000 ug | ORAL_CAPSULE | Freq: Two times a day (BID) | ORAL | 3 refills | Status: DC

## 2017-12-21 NOTE — Telephone Encounter (Signed)
A medication refill was received from pharmacy for tramadol 50 mg. Rx was pended to provider for approval  after verifying last fill date, provider, and quantity on PMP [place] database   Last fill date was June 2019. Pended to provider due to being unsure if this was intended to be a long term medication

## 2017-12-21 NOTE — Telephone Encounter (Signed)
New Rx faxed to [facility] for [place] instead.  Glad we finally found out the results of the prior multiple prior auths we did.  This is crazy how long it's taken to get an answer.  I ordered her movantik on 11/30/17.

## 2017-12-21 NOTE — Addendum Note (Signed)
Addended by: Gayland Curry on: 12/21/2017 04:48 PM   Modules accepted: Orders

## 2017-12-21 NOTE — Telephone Encounter (Signed)
[place] with [facility] called checking on the status of the prior authorization for Movantik. [place] and spoke with Sharyn Lull [phone] and she stated that the Prior Authorization for Movantik was DENIED. Stated that patient would have to try and fail Amitza or Relistor. She stated that they would have to have a prior authorization also but they have to be tried and failed before they approve the Movantik.

## 2017-12-21 NOTE — Telephone Encounter (Signed)
Pt does not always need it.  She uses it when her epidurals wear off for her back.

## 2017-12-22 ENCOUNTER — Ambulatory Visit (HOSPITAL_COMMUNITY)
Admission: RE | Admit: 2017-12-22 | Discharge: 2017-12-22 | Disposition: A | Discharge status: Home / Self Care | Payer: Medicare Other | Source: Ambulatory Visit | Attending: Cardiology | Admitting: Cardiology

## 2017-12-22 ENCOUNTER — Telehealth: Payer: Self-pay | Admitting: *Deleted

## 2017-12-22 DIAGNOSIS — I5032 Chronic diastolic (congestive) heart failure: Secondary | ICD-10-CM | POA: Insufficient documentation

## 2017-12-22 LAB — BASIC METABOLIC PANEL
Anion gap: 9 (ref 5–15)
BUN: 22 mg/dL (ref 8–23)
CO2: 26 mmol/L (ref 22–32)
Calcium: 10.9 mg/dL — ABNORMAL HIGH (ref 8.9–10.3)
Chloride: 102 mmol/L (ref 98–111)
Creatinine, Ser: 0.95 mg/dL (ref 0.44–1.00)
GFR calc non Af Amer: 53 mL/min — ABNORMAL LOW (ref >60)
Glucose, Bld: 120 mg/dL — ABNORMAL HIGH (ref 70–99)
POTASSIUM: 3.7 mmol/L (ref 3.5–5.1)
Sodium: 137 mmol/L (ref 135–145)

## 2017-12-22 NOTE — Telephone Encounter (Signed)
Received fax from [place] needs Prior Authorization. Initiated through [employer] Key: [id number] Went into determination. Awaiting Response.

## 2017-12-23 ENCOUNTER — Telehealth: Payer: Self-pay

## 2017-12-23 NOTE — Telephone Encounter (Signed)
Received call from [place] that Prior authorization for Amitiza has been approved for 1 year . Pharmacy is aware

## 2017-12-27 DIAGNOSIS — H353211 Exudative age-related macular degeneration, right eye, with active choroidal neovascularization: Secondary | ICD-10-CM | POA: Diagnosis not present

## 2017-12-27 DIAGNOSIS — H353112 Nonexudative age-related macular degeneration, right eye, intermediate dry stage: Secondary | ICD-10-CM | POA: Diagnosis not present

## 2017-12-27 DIAGNOSIS — H353124 Nonexudative age-related macular degeneration, left eye, advanced atrophic with subfoveal involvement: Secondary | ICD-10-CM | POA: Diagnosis not present

## 2017-12-27 DIAGNOSIS — H353222 Exudative age-related macular degeneration, left eye, with inactive choroidal neovascularization: Secondary | ICD-10-CM | POA: Diagnosis not present

## 2018-01-02 DIAGNOSIS — Z961 Presence of intraocular lens: Secondary | ICD-10-CM | POA: Diagnosis not present

## 2018-01-03 DIAGNOSIS — M47816 Spondylosis without myelopathy or radiculopathy, lumbar region: Secondary | ICD-10-CM | POA: Diagnosis not present

## 2018-01-09 ENCOUNTER — Other Ambulatory Visit: Payer: Self-pay | Admitting: *Deleted

## 2018-01-09 MED ORDER — FENTANYL 25 MCG/HR TD PT72: 25.0000 ug | MEDICATED_PATCH | TRANSDERMAL | 0 refills | Status: DC

## 2018-01-09 NOTE — Telephone Encounter (Signed)
Patient requested refill [place]CC[place] Database Verified LR: 12/09/2017 Pended Rx and sent to Dr. Mariea Clonts for approval.

## 2018-01-10 DIAGNOSIS — H353124 Nonexudative age-related macular degeneration, left eye, advanced atrophic with subfoveal involvement: Secondary | ICD-10-CM | POA: Diagnosis not present

## 2018-01-10 DIAGNOSIS — H353211 Exudative age-related macular degeneration, right eye, with active choroidal neovascularization: Secondary | ICD-10-CM | POA: Diagnosis not present

## 2018-01-10 DIAGNOSIS — H353112 Nonexudative age-related macular degeneration, right eye, intermediate dry stage: Secondary | ICD-10-CM | POA: Diagnosis not present

## 2018-01-10 DIAGNOSIS — H353222 Exudative age-related macular degeneration, left eye, with inactive choroidal neovascularization: Secondary | ICD-10-CM | POA: Diagnosis not present

## 2018-01-11 ENCOUNTER — Telehealth: Payer: Self-pay | Admitting: *Deleted

## 2018-01-11 DIAGNOSIS — K5903 Drug induced constipation: Secondary | ICD-10-CM

## 2018-01-11 MED ORDER — NALDEMEDINE TOSYLATE 0.2 MG PO TABS: 1.0000 | ORAL_TABLET | Freq: Every day | ORAL | 0 refills | Status: DC

## 2018-01-11 NOTE — Telephone Encounter (Signed)
Patient returned call. Patient was unable to recall which medication she is referencing. Patient asked to call me back.  Patient returned call 2 min later and stated the Amitiza was the medication that cost $93.00 and didn't wok

## 2018-01-11 NOTE — Telephone Encounter (Signed)
I sent in symproic for just one week supply.  It's once a day for her constipation.

## 2018-01-11 NOTE — Telephone Encounter (Signed)
Pt left note in healthcare stating that the laxative cost her $93.00 and it didn't work. Pt would like something else, just a few days worth to see if it works first before getting a whole prescription. Need to know if she' talking about the Amitiza or Movantik???   .left message to have patient return my call.

## 2018-01-12 DIAGNOSIS — M545 Low back pain: Secondary | ICD-10-CM | POA: Diagnosis not present

## 2018-01-12 MED ORDER — NALOXEGOL OXALATE 25 MG PO TABS: 25.0000 mg | ORAL_TABLET | Freq: Every day | ORAL | 3 refills | Status: DC

## 2018-01-12 NOTE — Telephone Encounter (Signed)
.  left message to have patient return my call.  

## 2018-01-12 NOTE — Telephone Encounter (Signed)
Spoke with patient and advised results   

## 2018-01-12 NOTE — Addendum Note (Signed)
Addended by: Despina Hidden on: 01/12/2018 03:48 PM   Modules accepted: Orders

## 2018-01-16 DIAGNOSIS — M47816 Spondylosis without myelopathy or radiculopathy, lumbar region: Secondary | ICD-10-CM | POA: Diagnosis not present

## 2018-01-17 ENCOUNTER — Telehealth: Payer: Self-pay | Admitting: Internal Medicine

## 2018-01-17 NOTE — Telephone Encounter (Signed)
I called the patient to reschedule her AWV that was cancelled on 01/11/18.  Sarah Mays will be at [facility] on 02/07/18, but she said she has something else that morning and that afternoon. Will try again at another time. VDM (DD)

## 2018-01-20 ENCOUNTER — Ambulatory Visit (HOSPITAL_COMMUNITY)
Admission: RE | Admit: 2018-01-20 | Discharge: 2018-01-20 | Disposition: A | Discharge status: Home / Self Care | Payer: Medicare Other | Source: Ambulatory Visit | Attending: Cardiology | Admitting: Cardiology

## 2018-01-20 ENCOUNTER — Other Ambulatory Visit: Payer: Self-pay | Admitting: Internal Medicine

## 2018-01-20 ENCOUNTER — Encounter (HOSPITAL_COMMUNITY): Payer: Self-pay | Admitting: Cardiology

## 2018-01-20 VITALS — BP 122/66 | HR 60 | Wt 107.4 lb

## 2018-01-20 DIAGNOSIS — Z87891 Personal history of nicotine dependence: Secondary | ICD-10-CM | POA: Insufficient documentation

## 2018-01-20 DIAGNOSIS — Z79890 Hormone replacement therapy (postmenopausal): Secondary | ICD-10-CM | POA: Diagnosis not present

## 2018-01-20 DIAGNOSIS — I480 Paroxysmal atrial fibrillation: Secondary | ICD-10-CM | POA: Diagnosis not present

## 2018-01-20 DIAGNOSIS — I340 Nonrheumatic mitral (valve) insufficiency: Secondary | ICD-10-CM

## 2018-01-20 DIAGNOSIS — D45 Polycythemia vera: Secondary | ICD-10-CM | POA: Insufficient documentation

## 2018-01-20 DIAGNOSIS — I5033 Acute on chronic diastolic (congestive) heart failure: Secondary | ICD-10-CM | POA: Diagnosis not present

## 2018-01-20 DIAGNOSIS — I5032 Chronic diastolic (congestive) heart failure: Secondary | ICD-10-CM | POA: Diagnosis not present

## 2018-01-20 DIAGNOSIS — E039 Hypothyroidism, unspecified: Secondary | ICD-10-CM | POA: Insufficient documentation

## 2018-01-20 DIAGNOSIS — Z7901 Long term (current) use of anticoagulants: Secondary | ICD-10-CM | POA: Insufficient documentation

## 2018-01-20 DIAGNOSIS — Z79899 Other long term (current) drug therapy: Secondary | ICD-10-CM | POA: Diagnosis not present

## 2018-01-20 DIAGNOSIS — Z8249 Family history of ischemic heart disease and other diseases of the circulatory system: Secondary | ICD-10-CM | POA: Diagnosis not present

## 2018-01-20 DIAGNOSIS — I495 Sick sinus syndrome: Secondary | ICD-10-CM | POA: Insufficient documentation

## 2018-01-20 DIAGNOSIS — H3530 Unspecified macular degeneration: Secondary | ICD-10-CM | POA: Insufficient documentation

## 2018-01-20 DIAGNOSIS — I110 Hypertensive heart disease with heart failure: Secondary | ICD-10-CM | POA: Insufficient documentation

## 2018-01-20 DIAGNOSIS — Z950 Presence of cardiac pacemaker: Secondary | ICD-10-CM | POA: Diagnosis not present

## 2018-01-20 LAB — BASIC METABOLIC PANEL
ANION GAP: 13 (ref 5–15)
BUN: 26 mg/dL — ABNORMAL HIGH (ref 8–23)
CALCIUM: 11.2 mg/dL — AB (ref 8.9–10.3)
CO2: 26 mmol/L (ref 22–32)
Chloride: 99 mmol/L (ref 98–111)
Creatinine, Ser: 0.9 mg/dL (ref 0.44–1.00)
GFR, EST NON AFRICAN AMERICAN: 58 mL/min — AB (ref >60)
GLUCOSE: 99 mg/dL (ref 70–99)
POTASSIUM: 3.6 mmol/L (ref 3.5–5.1)
SODIUM: 138 mmol/L (ref 135–145)

## 2018-01-20 LAB — CBC
HCT: 37.7 % (ref 36.0–46.0)
HEMOGLOBIN: 12 g/dL (ref 12.0–15.0)
MCH: 36.5 pg — AB (ref 26.0–34.0)
MCHC: 31.8 g/dL (ref 30.0–36.0)
MCV: 114.6 fL — ABNORMAL HIGH (ref 80.0–100.0)
NRBC: 0 % (ref 0.0–0.2)
PLATELETS: 460 10*3/uL — AB (ref 150–400)
RBC: 3.29 MIL/uL — AB (ref 3.87–5.11)
RDW: 15.6 % — ABNORMAL HIGH (ref 11.5–15.5)
WBC: 7.6 10*3/uL (ref 4.0–10.5)

## 2018-01-20 NOTE — Patient Instructions (Signed)
Labs today We will only contact you if something comes back abnormal or we need to make some changes. Otherwise no news is good news!  Your physician recommends that you schedule a follow-up appointment in: 3 months with an ECHO  Your physician has requested that you have an echocardiogram. Echocardiography is a painless test that uses sound waves to create images of your heart. It provides your doctor with information about the size and shape of your heart and how well your heart's chambers and valves are working. This procedure takes approximately one hour. There are no restrictions for this procedure.

## 2018-01-22 NOTE — Progress Notes (Signed)
Patient ID: Sarah Mays, female   DOB: 11-15-1930, 84 y.o.   MRN: [id number] PCP: Dr Hollace Kinnier Cardiology: Dr. Aundra Dubin  84 y.o. with paroxysmal atrial fibrillation and chronic diastolic CHF returns for cardiology evaluation.  In 7/16, she was admitted with chest pain.  Cardiolite showed no ischemia or infarction.  Echo in 6/17 showed EF 60-65%.  She developed problems elevated HR when in atrial fibrillation but bradycardia when in NSR.  She underwent atrial fibrillation ablation in 11/17.  She is tolerating Xarelto without melena or BRBPR.  Dr. Rayann Heman had her stop amiodarone.    Echo in 11/18 showed EF 60-65%, PASP 47 mmHg.   TEE-guided DCCV back to NSR in 6/19.  TEE showed EF 55-60% with moderate-severe MR.   She developed multiple episodes of epistaxis with Xarelto use.  We have transitioned her to apixaban 2.5 mg bid and she has not had epistaxis since then.   She presents today for regular follow up of CHF and atrial fibrillation. She is not in atrial fibrillation today. No palpitations.  She is short of breath if she walks fast but no dyspnea walking at her normal pace. No orthopnea/PND.  No chest pain.  No BRBPR/melena.   ECG: wandering atrial pacemaker   Labs (7/16): K 4, creatinine 0.94, calcium 10.8, LFTs normal Labs (9/16): K 4, creatinine 0.9, LDL 69, TSH normal, HCT 42.3 Labs (5/17): K 4.6, creatinine 0.94, HCT 41.5, LFTs normal. Labs (6/17): K 5, creatinine 1.04, LDL 42, HDL 101, TSH normal Labs (12/17): K 3.7, creatinine 0.76, hgb 11.2 Labs (4/18): K 4.6, creatinine 0.91 Labs (10/18): K 4.5, creatinine 0.62 Labs (12/18): hgb 13.5 Labs (1/19): K 4.3, creatinine 0.6 Labs (4/19): hgb 13.5 Labs (7/19): K 4.5, creatinine 0.5, hgb 12.1 Labs (11/19): K 3.7, creatinine 0.95  PMH: 1. Atrial fibrillation: Diagnosed initially in 10/13.  Holter monitor in 11/13 showed atrial fibrillation with average rate 65.  - Atrial fibrillation ablation in 11/17.  - TEE-guided DCCV in  6/19.  2. Chronic diastolic CHF: Echo (74/12) with EF 65-70%, mild MR, moderate biatrial enlargement, moderate TR, PA systolic pressure 45 mmHg.   Echo (10/15): EF 60-65%.  Echo (7/16) with EF 60-65%, mild MR.  - Echo (6/17): EF 60-65%, PASP 41 mmHg.  - Echo (11/18): EF 60-65%, PASP 47 mmHg - TEE (6/19): EF 55-60%, mild LVH, mildly decreased RV systolic function, peak RV-RA gradient 40 mmHg, moderate-severe MR with bileaflet MVP, ERO 0.37 cm^2.  3. ETT-Sestamibi (11/13): Exercised to stage II, small partially reversible anteroapical perfusion defect suggesting mild ischemia versus attenuation, EF 80%.  Cardiolite (7/16) with EF 64%, no ischemia or infarction.  4. HTN: Lower extremity swelling with amlodipine.  5. Polycythemia vera: Has had phlebotomy only once.  6. Hypothyroidism 7. Macular degeneration 8. TAH 1980 9. Familial hypocalciuric hypercalcemia 10. PFTs (amiodarone use) in 1/14 showed a mild obstructive defect.  11. Degenerative disc disease 12. Sick sinus syndrome: Has not required PPM.  13. Mitral regurgitation: TEE (6/19) with moderate-severe MR with bileaflet MVP, ERO 0.37 cm^2.   SH: Prior smoker (years ago).  Widowed, lived in [place] but now at [employer]  Occasional ETOH.  Daughter works for [employer].   FH: CAD  Review of systems complete and found to be negative unless listed in HPI.    Current Outpatient Medications  Medication Sig Dispense Refill  . acetaminophen (TYLENOL) 325 MG tablet Take 2 tablets (650 mg total) by mouth every 4 (four) hours as needed for mild pain or  moderate pain. Not to exceed 3g in 24h 120 tablet 0  . apixaban (ELIQUIS) 2.5 MG TABS tablet Take 1 tablet (2.5 mg total) by mouth 2 (two) times daily. 60 tablet 6  . Cholecalciferol (VITAMIN D) 2000 units CAPS Take 2,000 Units by mouth daily.     . fentaNYL (DURAGESIC - DOSED MCG/HR) 25 MCG/HR patch Place 1 patch (25 mcg total) onto the skin every 3 (three) days. 10 patch 0  . furosemide  (LASIX) 40 MG tablet Take 1.5 tablets (60 mg total) by mouth 2 (two) times daily. 90 tablet 3  . gabapentin (NEURONTIN) 100 MG capsule TAKE THREE (3) CAPSULES AT BEDTIME. (Patient taking differently: Takes only one 100 mg a day not 300mg ) 90 capsule 0  . hydrALAZINE (APRESOLINE) 50 MG tablet TAKE 1 1/2 TABLETS THREE TIMES DAILY. 135 tablet 11  . hydroxyurea (HYDREA) 500 MG capsule 500 mg on Mondays, Wednesdays and Fridays and 1000 mg the other days of the week by mouth 100 capsule 9  . levothyroxine (SYNTHROID, LEVOTHROID) 100 MCG tablet TAKE 1 TABLET ONCE DAILY BEFORE BREAKFAST. 90 tablet 1  . Magnesium 250 MG TABS Take 250 mg by mouth daily.     . Melatonin 10 MG TABS Take 10 mg by mouth at bedtime.    . Multiple Vitamins-Minerals (PRESERVISION AREDS 2) CAPS Take 1 capsule by mouth 2 (two) times daily.     . naloxegol oxalate (MOVANTIK) 25 MG TABS tablet Take 1 tablet (25 mg total) by mouth daily. 30 tablet 3  . potassium chloride (K-DUR) 10 MEQ tablet TAKE 1 TABLET BY MOUTH TWICE DAILY. 60 tablet 6  . senna-docusate (SENOKOT-S) 8.6-50 MG tablet Take 2 tablets by mouth 2 (two) times daily.    . sodium chloride (OCEAN) 0.65 % SOLN nasal spray Place 2 sprays into both nostrils as needed for congestion.    . traMADol (ULTRAM) 50 MG tablet TAKE ONE TABLET AT BEDTIME. 30 tablet 0  . Naldemedine Tosylate (SYMPROIC) 0.2 MG TABS Take 1 tablet by mouth daily. (Patient not taking: Reported on 01/20/2018) 7 tablet 0  . neomycin-polymyxin-hydrocortisone (CORTISPORIN) OTIC solution Apply 1-2 drops to toe after soaking twice a day (Patient not taking: Reported on 01/20/2018) 10 mL 0   No current facility-administered medications for this encounter.    Vitals:   01/20/18 1439  BP: 122/66  Pulse: 60  SpO2: 97%  Weight: 48.7 kg (107 lb 6.4 oz)    Wt Readings from Last 3 Encounters:  01/20/18 48.7 kg (107 lb 6.4 oz)  11/30/17 48.1 kg (106 lb)  11/17/17 48.1 kg (106 lb)   Physical Exam General:  NAD Neck: No JVD, no thyromegaly or thyroid nodule.  Lungs: Clear to auscultation bilaterally with normal respiratory effort. CV: Nondisplaced PMI.  Heart irregular S1/S2, no S3/S4, 2/6 HSM LLSB/apex.  No peripheral edema.  No carotid bruit.  Normal pedal pulses.  Abdomen: Soft, nontender, no hepatosplenomegaly, no distention.  Skin: Intact without lesions or rashes.  Neurologic: Alert and oriented x 3.  Psych: Normal affect. Extremities: No clubbing or cyanosis.  HEENT: Normal.   Assessment/Plan: 1. Atrial fibrillation: First noted in 10/13.  Atrial fibrillation has triggered acute on chronic diastolic CHF in the past.  CHADSVASC score is 43 (age, female gender, HTN, CHF).  She is anticoagulated with Xarelto 15 mg daily which is appropriate for her creatinine clearance.  She had atrial fibrillation ablation in 11/17.  She is now off amiodarone.  TEE-guided DCCV in 6/19.  She appears  to have a wandering atrial pacemaker today.    - She is not on nodal blockers due to h/o sick sinus syndrome.  - Xarelto stopped and apixaban 2.5 mg bid started due to epistaxis, no epistaxis on apixaban.  2. Chronic diastolic CHF:  NYHA class II symptoms currently. She does not appear volume overloaded.  - Continue Lasix 60 mg bid. BMET today.   - Continue KCl 65mEq bid.  3. HTN: BP reasonably controlled, continue current meds.     4. Bradycardia: Suspect she has a degree of sick sinus syndrome. This has been stable off nodal blocking agents.  5. Mitral regurgitation: Moderate to severe MR in setting of bileaflet prolapse on TEE in 6/19.  - Repeat echo in 3 months with followup to reassess MR.  She may be a Mitraclip candidate in the future.   Followup in 3 months with echo.    Loralie Champagne 01/22/2018

## 2018-01-26 NOTE — Progress Notes (Signed)
[name] [address], [place] [address] Phone: [phone] Subjective:   Sarah Mays, am serving as a scribe for Dr. Hulan Saas.   CC: Back pain follow-up  QJJ:HERDEYCXKG  Sarah Mays is a 83 y.o. female coming in with complaint of lower back pain. Today she does not have pain. Pain is like a toothache when she does have pain. Left worse than right. Does wear patch on her back and takes tylenol which helps alleviate her pain. Mays trouble sleeping.  Patient recently did have another MRI from an outside facility.  This was independently visualized by me.  Patient has degenerative disc disease has progressed somewhat in patients degenerative scoliosis is worsening.  Significant facet arthropathy noted as well.  Very large severe compression fracture noted at L1 but appears to be chronic.    Past Medical History:  Diagnosis Date  . Abnormal stress test    a. 11/2011 Ex MV: EF 80%, small, partially reversible anteroapical defect->mild ischemia vs attenuation-->med Rx.  [name] Kitchen Alopecia, unspecified   . Arthritis   . Chronic diastolic CHF (congestive heart failure) ([place])    a. 11/2013 Echo: EF 60-65%, Mays rwma, mild TR, PASP 68mmHg.  . Closed fracture of lower end of radius with ulna   . Deafness    Left, s/p multiple surgeries  . Essential hypertension   . Gouty arthropathy, unspecified   . History of kidney stones   . Hypothyroidism   . Insomnia, unspecified   . Macular degeneration    Left, s/p inj. tx  . Mitral valve disorders(424.0)   . Neck mass    right, w/u including MRI negative  . PAF (paroxysmal atrial fibrillation) ([facility])    a. on amio (02/2012 mild obstruction on PFT's) and xarelto.  . Polycythemia vera(238.4)   . Pure hypercholesterolemia   . Senile osteoporosis    Reclast in the past  . Tricuspid valve disorders, specified as nonrheumatic    Past Surgical History:  Procedure Laterality Date  . BREAST BIOPSY  06/18/1998    left  . CARDIOVERSION N/A 07/15/2017   Procedure: CARDIOVERSION;  Surgeon: Larey Dresser, MD;  Location: [facility] ENDOSCOPY;  Service: Cardiovascular;  Laterality: N/A;  . ELECTROPHYSIOLOGIC STUDY N/A 01/08/2016   Procedure: Atrial Fibrillation Ablation;  Surgeon: Thompson Grayer, MD;  Location: [place] CV LAB;  Service: Cardiovascular;  Laterality: N/A;  . FEMUR IM NAIL Right 10/08/2015   Procedure: INTRAMEDULLARY (IM) NAIL FEMORAL RIGHT;  Surgeon: Paralee Cancel, MD;  Location: [facility] ORS;  Service: Orthopedics;  Laterality: Right;  . MASS EXCISION Right 11/26/2016   Procedure: EXCISION RIGHT LATERAL NECK MASS;  Surgeon: Jerrell Belfast, MD;  Location: [place];  Service: ENT;  Laterality: Right;  . TEE WITHOUT CARDIOVERSION N/A 07/15/2017   Procedure: TRANSESOPHAGEAL ECHOCARDIOGRAM (TEE);  Surgeon: Larey Dresser, MD;  Location: [facility] ENDOSCOPY;  Service: Cardiovascular;  Laterality: N/A;  . TONSILLECTOMY    . TOTAL ABDOMINAL HYSTERECTOMY    . TYMPANOPLASTY Bilateral    Social History   Socioeconomic History  . Marital status: Married    Spouse name: [name]  . Number of children: 2  . Years of education: 53  . Highest education level: Not on file  Occupational History  . Not on file  Social Needs  . Financial resource strain: Not on file  . Food insecurity:    Worry: Not on file    Inability: Not on file  . Transportation needs:  Medical: Not on file    Non-medical: Not on file  Tobacco Use  . Smoking status: Former Smoker    Types: Cigarettes    Last attempt to quit: 01/19/1984    Years since quitting: 34.0  . Smokeless tobacco: Never Used  Substance and Sexual Activity  . Alcohol use: Yes    Alcohol/week: 1.0 standard drinks    Types: 1 Glasses of wine per week    Comment: 2 per day/ 14 a week  . Drug use: Mays  . Sexual activity: Not Currently  Lifestyle  . Physical activity:    Days per week: Not on file    Minutes per session: Not on file  . Stress: Not on file    Relationships  . Social connections:    Talks on phone: Not on file    Gets together: Not on file    Attends religious service: Not on file    Active member of club or organization: Not on file    Attends meetings of clubs or organizations: Not on file    Relationship status: Not on file  Other Topics Concern  . Not on file  Social History Narrative   Patient is Married 1955. 2 kids. 31 grandkid-66 years old in 2016. College graduate [name] [place]).    Lives in apartment,  Independent Living  section at [place] since 02/2013.      Stay at home mother.       Mays Smoking history, Mod. alcohol use.   Patient has a living will, POA      Hobbies: CPU and painting         Allergies  Allergen Reactions  . Amlodipine Swelling and Other (See Comments)    Reaction:  Lower extremity swelling    Family History  Problem Relation Age of Onset  . Hypertension Father   . Heart disease Father        patient does not know details.   . Ovarian cancer Mother 28  . Cancer Sister        colon  . Heart disease Sister   . Cancer Sister   . Cancer Sister        hodgkin disease  . Breast cancer Daughter   . Cancer Daughter        breast    Current Outpatient Medications (Endocrine & Metabolic):  .  levothyroxine (SYNTHROID, LEVOTHROID) 100 MCG tablet, TAKE 1 TABLET ONCE DAILY BEFORE BREAKFAST. .  calcitonin, salmon, (MIACALCIN) 200 UNIT/ACT nasal spray, Place 1 spray into alternate nostrils daily.  Current Outpatient Medications (Cardiovascular):  .  furosemide (LASIX) 40 MG tablet, Take 1.5 tablets (60 mg total) by mouth 2 (two) times daily. .  hydrALAZINE (APRESOLINE) 50 MG tablet, TAKE 1 1/2 TABLETS THREE TIMES DAILY.  Current Outpatient Medications (Respiratory):  .  sodium chloride (OCEAN) 0.65 % SOLN nasal spray, Place 2 sprays into both nostrils as needed for congestion.  Current Outpatient Medications (Analgesics):  .  acetaminophen (TYLENOL) 325 MG  tablet, Take 2 tablets (650 mg total) by mouth every 4 (four) hours as needed for mild pain or moderate pain. Not to exceed 3g in 24h .  fentaNYL (DURAGESIC - DOSED MCG/HR) 25 MCG/HR patch, Place 1 patch (25 mcg total) onto the skin every 3 (three) days. .  traMADol (ULTRAM) 50 MG tablet, TAKE ONE TABLET AT BEDTIME.  Current Outpatient Medications (Hematological):  .  apixaban (ELIQUIS) 2.5 MG TABS tablet, Take 1 tablet (2.5 mg total)  by mouth 2 (two) times daily.  Current Outpatient Medications (Other):  [name] Kitchen  Cholecalciferol (VITAMIN D) 2000 units CAPS, Take 2,000 Units by mouth daily.  [name] Kitchen  gabapentin (NEURONTIN) 100 MG capsule, TAKE THREE (3) CAPSULES AT BEDTIME. (Patient taking differently: Takes only one 100 mg a day not 300mg ) .  hydroxyurea (HYDREA) 500 MG capsule, 500 mg on Mondays, Wednesdays and Fridays and 1000 mg the other days of the week by mouth .  Magnesium 250 MG TABS, Take 250 mg by mouth daily.  .  Melatonin 10 MG TABS, Take 10 mg by mouth at bedtime. .  Multiple Vitamins-Minerals (PRESERVISION AREDS 2) CAPS, Take 1 capsule by mouth 2 (two) times daily.  Melynda Ripple Tosylate (SYMPROIC) 0.2 MG TABS, Take 1 tablet by mouth daily. .  naloxegol oxalate (MOVANTIK) 25 MG TABS tablet, Take 1 tablet (25 mg total) by mouth daily. [name] Kitchen  neomycin-polymyxin-hydrocortisone (CORTISPORIN) OTIC solution, Apply 1-2 drops to toe after soaking twice a day .  potassium chloride (K-DUR) 10 MEQ tablet, TAKE 1 TABLET BY MOUTH TWICE DAILY. [name] Kitchen  senna-docusate (SENOKOT-S) 8.6-50 MG tablet, Take 2 tablets by mouth 2 (two) times daily. .  Vitamin D, Ergocalciferol, (DRISDOL) 1.25 MG (50000 UT) CAPS capsule, Take 1 capsule (50,000 Units total) by mouth every 7 (seven) days.    Past medical history, social, surgical and family history all reviewed in electronic medical record.  Mays pertanent information unless stated regarding to the chief complaint.   Review of Systems:  Mays headache, visual changes,  nausea, vomiting, diarrhea, constipation, dizziness, abdominal pain, skin rash, fevers, chills, night sweats, weight loss, swollen lymph nodes, , chest pain, shortness of breath, mood changes.  Positive muscle aches, body aches,  Objective  Blood pressure 128/72, pulse 64, height 5' (1.524 m), weight 109 lb (49.4 kg), SpO2 99 %.   General: Mays apparent distress alert and oriented x3 mood and affect normal, dressed appropriately.  HEENT: Pupils equal, extraocular movements intact  Respiratory: Patient's speak in full sentences and does not appear short of breath  Cardiovascular: Trace lower extremity edema, non tender, Mays erythema  Skin: Warm dry intact with Mays signs of infection or rash on extremities or on axial skeleton.  Abdomen: Soft nontender  Neuro: Cranial nerves II through XII are intact, neurovascularly intact in all extremities with 2+ DTRs and 2+ pulses.  Lymph: Mays lymphadenopathy of posterior or anterior cervical chain or axillae bilaterally.  Gait antalgic walking with the aid of a walker MSK:  tender with limited range of motion and stability and symmetric strength and tone of shoulders, elbows, wrist, hip, knee and ankles bilaterally.  Moderate to severe arthritic changes of multiple joints.  Rotator cuff arthropathy noted significant arthritic changes of the neck with significant crepitus Mild atrophy noted of the lower extremities bilaterally.  Degenerative scoliosis is severe noted. Moderate to severe tenderness diffusely of the paraspinal musculature of the lumbar in the thoracolumbar juncture.  4-5 strength in lower extremities.  Patient does have severe loss of motion in the back in multiple planes.   Impression and Recommendations:     This case required medical decision making of moderate complexity. The above documentation has been reviewed and is accurate and complete Lyndal Pulley, DO       Note: This dictation was prepared with Dragon dictation along with  smaller phrase technology. Any transcriptional errors that result from this process are unintentional.

## 2018-01-27 ENCOUNTER — Ambulatory Visit (INDEPENDENT_AMBULATORY_CARE_PROVIDER_SITE_OTHER): Payer: Medicare Other | Admitting: Family Medicine

## 2018-01-27 ENCOUNTER — Other Ambulatory Visit: Payer: Medicare Other

## 2018-01-27 ENCOUNTER — Encounter: Payer: Self-pay | Admitting: Family Medicine

## 2018-01-27 VITALS — BP 128/72 | HR 64 | Ht 60.0 in | Wt 109.0 lb

## 2018-01-27 DIAGNOSIS — M48062 Spinal stenosis, lumbar region with neurogenic claudication: Secondary | ICD-10-CM | POA: Diagnosis not present

## 2018-01-27 DIAGNOSIS — M2550 Pain in unspecified joint: Secondary | ICD-10-CM

## 2018-01-27 DIAGNOSIS — R040 Epistaxis: Secondary | ICD-10-CM

## 2018-01-27 DIAGNOSIS — M810 Age-related osteoporosis without current pathological fracture: Secondary | ICD-10-CM | POA: Diagnosis not present

## 2018-01-27 DIAGNOSIS — M545 Low back pain, unspecified: Secondary | ICD-10-CM

## 2018-01-27 DIAGNOSIS — D45 Polycythemia vera: Secondary | ICD-10-CM

## 2018-01-27 MED ORDER — VITAMIN D (ERGOCALCIFEROL) 1.25 MG (50000 UNIT) PO CAPS: 50000.0000 [IU] | ORAL_CAPSULE | ORAL | 0 refills | Status: DC

## 2018-01-27 MED ORDER — CALCITONIN (SALMON) 200 UNIT/ACT NA SOLN: 1.0000 | Freq: Every day | NASAL | 0 refills | Status: DC

## 2018-01-27 NOTE — Patient Instructions (Addendum)
Good to see you  Labs downstairs to look at the calcium level  Calcitonin 1 spray in nostril daily.  Once weekly vitamin D for 12 weeks See me again in 4 weeks Happy holidays!

## 2018-01-28 DIAGNOSIS — M48062 Spinal stenosis, lumbar region with neurogenic claudication: Secondary | ICD-10-CM | POA: Insufficient documentation

## 2018-01-28 NOTE — Assessment & Plan Note (Signed)
Moderate severe degenerative scoliosis of the lumbar spine.  Discussed icing regimen and home exercise.  Discussed which activities to do which wants to avoid.  Discussed monitoring closely about other different changes that could be done.  Follow-up with me again in 4 to 8 weeks.  Patient could be candidate for different injections but has not responded well to them in the past.  I believe that hypercalcemia can be contributing to some of the pain as well and further work-up with PTH is necessary.  Ambulatory referral for physical therapy at this time as well.  Patient as well as daughter is in agreement with the plan and will follow-up as stated above.

## 2018-01-28 NOTE — Assessment & Plan Note (Signed)
Discussed the possibility of calcitonin but history of epistaxis asked that discussed 3 different emergency room visits we will not do at this time

## 2018-01-28 NOTE — Assessment & Plan Note (Signed)
Encourage patient to continue supplementation with current treatment.  Once weekly vitamin D encourage as well.

## 2018-02-03 LAB — PTH, INTACT AND CALCIUM
Calcium: 11.8 mg/dL — ABNORMAL HIGH (ref 8.6–10.4)
PTH: 111 pg/mL — ABNORMAL HIGH (ref 14–64)

## 2018-02-03 LAB — PTH-RELATED PEPTIDE: PTH-RELATED PROTEIN (PTH-RP): 14 pg/mL (ref 14–27)

## 2018-02-03 LAB — CALCIUM, IONIZED: Calcium, Ion: 6.34 mg/dL — ABNORMAL HIGH (ref 4.8–5.6)

## 2018-02-06 ENCOUNTER — Encounter: Payer: Self-pay | Admitting: Family Medicine

## 2018-02-07 DIAGNOSIS — H353222 Exudative age-related macular degeneration, left eye, with inactive choroidal neovascularization: Secondary | ICD-10-CM | POA: Diagnosis not present

## 2018-02-07 DIAGNOSIS — H353124 Nonexudative age-related macular degeneration, left eye, advanced atrophic with subfoveal involvement: Secondary | ICD-10-CM | POA: Diagnosis not present

## 2018-02-09 ENCOUNTER — Other Ambulatory Visit: Payer: Self-pay | Admitting: *Deleted

## 2018-02-09 ENCOUNTER — Other Ambulatory Visit (HOSPITAL_COMMUNITY): Payer: Self-pay | Admitting: Cardiology

## 2018-02-09 DIAGNOSIS — H353211 Exudative age-related macular degeneration, right eye, with active choroidal neovascularization: Secondary | ICD-10-CM | POA: Diagnosis not present

## 2018-02-09 DIAGNOSIS — H353112 Nonexudative age-related macular degeneration, right eye, intermediate dry stage: Secondary | ICD-10-CM | POA: Diagnosis not present

## 2018-02-09 MED ORDER — FENTANYL 25 MCG/HR TD PT72: 25.0000 ug | MEDICATED_PATCH | TRANSDERMAL | 0 refills | Status: DC

## 2018-02-09 NOTE — Telephone Encounter (Signed)
Patient requested refill NCCSRS Database Verified LR: 01/20/2018 Pended Rx and sent to Dr. Mariea Clonts for approval.

## 2018-02-09 NOTE — Addendum Note (Signed)
Addended by: Rafael Bihari A on: 02/09/2018 09:49 AM   Modules accepted: Orders

## 2018-02-14 ENCOUNTER — Ambulatory Visit: Payer: Medicare Other

## 2018-02-15 ENCOUNTER — Ambulatory Visit: Payer: Medicare Other | Admitting: *Deleted

## 2018-02-15 DIAGNOSIS — M810 Age-related osteoporosis without current pathological fracture: Secondary | ICD-10-CM

## 2018-02-15 DIAGNOSIS — M8000XD Age-related osteoporosis with current pathological fracture, unspecified site, subsequent encounter for fracture with routine healing: Secondary | ICD-10-CM

## 2018-02-15 MED ORDER — DENOSUMAB 60 MG/ML ~~LOC~~ SOSY
60.0000 mg | PREFILLED_SYRINGE | Freq: Once | SUBCUTANEOUS | Status: AC
  Administered 2018-02-15: 60 mg via SUBCUTANEOUS

## 2018-02-20 ENCOUNTER — Other Ambulatory Visit: Payer: Self-pay | Admitting: Internal Medicine

## 2018-02-20 NOTE — Progress Notes (Signed)
[facility] [place], [place] [address] Phone: [phone] Subjective:    I'm seeing this patient by the request  of:    CC: Polyarthralgia follow-up  JIR:[id number]  TAYLORANN TKACH is a 85 y.o. female coming in with complaint of polyarthralgia. States that she is still experiencing pain.  Patient was found to have worsening hypercalcemia with a calcium level of 6.34.  Also an elevated PTH of 111.  Worsening MCV as well.  Has seen multiple providers previously.  Continues to have back pain in all of her pain.     Past Medical History:  Diagnosis Date  . Abnormal stress test    a. 11/2011 Ex MV: EF 80%, small, partially reversible anteroapical defect->mild ischemia vs attenuation-->med Rx.  [name] Kitchen Alopecia, unspecified   . Arthritis   . Chronic diastolic CHF (congestive heart failure) ([place])    a. 11/2013 Echo: EF 60-65%, no rwma, mild TR, PASP 45mmHg.  . Closed fracture of lower end of radius with ulna   . Deafness    Left, s/p multiple surgeries  . Essential hypertension   . Gouty arthropathy, unspecified   . History of kidney stones   . Hypothyroidism   . Insomnia, unspecified   . Macular degeneration    Left, s/p inj. tx  . Mitral valve disorders(424.0)   . Neck mass    right, w/u including MRI negative  . PAF (paroxysmal atrial fibrillation) (H[facility])    a. on amio (02/2012 mild obstruction on PFT's) and xarelto.  . Polycythemia vera(238.4)   . Pure hypercholesterolemia   . Senile osteoporosis    Reclast in the past  . Tricuspid valve disorders, specified as nonrheumatic    Past Surgical History:  Procedure Laterality Date  . BREAST BIOPSY  06/18/1998   left  . CARDIOVERSION N/A 07/15/2017   Procedure: CARDIOVERSION;  Surgeon: Larey Dresser, MD;  Location: [facility] ENDOSCOPY;  Service: Cardiovascular;  Laterality: N/A;  . ELECTROPHYSIOLOGIC STUDY N/A 01/08/2016   Procedure: Atrial Fibrillation Ablation;  Surgeon: Thompson Grayer, MD;   Location: [facility] LAB;  Service: Cardiovascular;  Laterality: N/A;  . FEMUR IM NAIL Right 10/08/2015   Procedure: INTRAMEDULLARY (IM) NAIL FEMORAL RIGHT;  Surgeon: Paralee Cancel, MD;  Location: [facility] ORS;  Service: Orthopedics;  Laterality: Right;  . MASS EXCISION Right 11/26/2016   Procedure: EXCISION RIGHT LATERAL NECK MASS;  Surgeon: Jerrell Belfast, MD;  Location: [facility];  Service: ENT;  Laterality: Right;  . TEE WITHOUT CARDIOVERSION N/A 07/15/2017   Procedure: TRANSESOPHAGEAL ECHOCARDIOGRAM (TEE);  Surgeon: Larey Dresser, MD;  Location: [facility] ENDOSCOPY;  Service: Cardiovascular;  Laterality: N/A;  . TONSILLECTOMY    . TOTAL ABDOMINAL HYSTERECTOMY    . TYMPANOPLASTY Bilateral    Social History   Socioeconomic History  . Marital status: Married    Spouse name: [name]  . Number of children: 2  . Years of education: 82  . Highest education level: Not on file  Occupational History  . Not on file  Social Needs  . Financial resource strain: Not on file  . Food insecurity:    Worry: Not on file    Inability: Not on file  . Transportation needs:    Medical: Not on file    Non-medical: Not on file  Tobacco Use  . Smoking status: Former Smoker    Types: Cigarettes    Last attempt to quit: 01/19/1984    Years since quitting: 34.1  . Smokeless tobacco:  Never Used  Substance and Sexual Activity  . Alcohol use: Yes    Alcohol/week: 1.0 standard drinks    Types: 1 Glasses of wine per week    Comment: 2 per day/ 14 a week  . Drug use: No  . Sexual activity: Not Currently  Lifestyle  . Physical activity:    Days per week: Not on file    Minutes per session: Not on file  . Stress: Not on file  Relationships  . Social connections:    Talks on phone: Not on file    Gets together: Not on file    Attends religious service: Not on file    Active member of club or organization: Not on file    Attends meetings of clubs or organizations: Not on file    Relationship status: Not on file    Other Topics Concern  . Not on file  Social History Narrative   Patient is Married 1955. 2 kids. 24 grandkid-68 years old in 2016. College graduate [name]).    Lives in apartment,  Independent Living  section at [place] since 02/2013.      Stay at home mother.       No Smoking history, Mod. alcohol use.   Patient has a living will, POA      Hobbies: CPU and painting         Allergies  Allergen Reactions  . Amlodipine Swelling and Other (See Comments)    Reaction:  Lower extremity swelling    Family History  Problem Relation Age of Onset  . Hypertension Father   . Heart disease Father        patient does not know details.   . Ovarian cancer Mother 3  . Cancer Sister        colon  . Heart disease Sister   . Cancer Sister   . Cancer Sister        hodgkin disease  . Breast cancer Daughter   . Cancer Daughter        breast    Current Outpatient Medications (Endocrine & Metabolic):  .  levothyroxine (SYNTHROID, LEVOTHROID) 100 MCG tablet, TAKE 1 TABLET ONCE DAILY BEFORE BREAKFAST.  Current Outpatient Medications (Cardiovascular):  .  furosemide (LASIX) 40 MG tablet, Take 1.5 tablets (60 mg total) by mouth 2 (two) times daily. .  hydrALAZINE (APRESOLINE) 50 MG tablet, TAKE 1 1/2 TABLETS THREE TIMES DAILY.  Current Outpatient Medications (Respiratory):  .  sodium chloride (OCEAN) 0.65 % SOLN nasal spray, Place 2 sprays into both nostrils as needed for congestion.  Current Outpatient Medications (Analgesics):  .  acetaminophen (TYLENOL) 325 MG tablet, Take 2 tablets (650 mg total) by mouth every 4 (four) hours as needed for mild pain or moderate pain. Not to exceed 3g in 24h .  fentaNYL (DURAGESIC - DOSED MCG/HR) 25 MCG/HR patch, Place 1 patch (25 mcg total) onto the skin every 3 (three) days. .  traMADol (ULTRAM) 50 MG tablet, TAKE ONE TABLET AT BEDTIME.  Current Outpatient Medications (Hematological):  .  apixaban (ELIQUIS) 2.5 MG  TABS tablet, Take 1 tablet (2.5 mg total) by mouth 2 (two) times daily.  Current Outpatient Medications (Other):  [name] Kitchen  Cholecalciferol (VITAMIN D) 2000 units CAPS, Take 2,000 Units by mouth daily.  [name] Kitchen  gabapentin (NEURONTIN) 100 MG capsule, TAKE THREE (3) CAPSULES AT BEDTIME. (Patient taking differently: Takes only one 100 mg a day not 300mg ) .  hydroxyurea (HYDREA) 500 MG capsule, 500  mg on Mondays, Wednesdays and Fridays and 1000 mg the other days of the week by mouth .  Magnesium 250 MG TABS, Take 250 mg by mouth daily.  .  Melatonin 10 MG TABS, Take 10 mg by mouth at bedtime. .  Multiple Vitamins-Minerals (PRESERVISION AREDS 2) CAPS, Take 1 capsule by mouth 2 (two) times daily.  Melynda Ripple Tosylate (SYMPROIC) 0.2 MG TABS, Take 1 tablet by mouth daily. .  naloxegol oxalate (MOVANTIK) 25 MG TABS tablet, Take 1 tablet (25 mg total) by mouth daily. .  potassium chloride (K-DUR) 10 MEQ tablet, TAKE 1 TABLET BY MOUTH TWICE DAILY. [name] Kitchen  senna-docusate (SENOKOT-S) 8.6-50 MG tablet, Take 2 tablets by mouth 2 (two) times daily. .  Vitamin D, Ergocalciferol, (DRISDOL) 1.25 MG (50000 UT) CAPS capsule, Take 1 capsule (50,000 Units total) by mouth every 7 (seven) days.    Past medical history, social, surgical and family history all reviewed in electronic medical record.  No pertanent information unless stated regarding to the chief complaint.   Review of Systems:  No headache, visual changes, nausea, vomiting, diarrhea, constipation, dizziness, abdominal pain, skin rash, fevers, chills, night sweats, weight loss, swollen lymph nodes,chest pain, shortness of breath, mood changes.  Positive muscle aches, body aches  Objective  Blood pressure 128/70, pulse 78, height 5' (1.524 m), weight 107 lb (48.5 kg), SpO2 90 %.  General: No apparent distress alert and oriented x3 mood and affect normal, dressed appropriately.  HEENT: Pupils equal, extraocular movements intact mild nodularity noted of the thyroid but  no significant mass felt Respiratory: Patient's speak in full sentences and does not appear short of breath  Cardiovascular: Trace lower extremity edema, non tender, no erythema  Skin: Warm dry intact with no signs of infection or rash on extremities or on axial skeleton.  Abdomen: Soft mildly distended Neuro: Cranial nerves II through XII are intact, neurovascularly intact in all extremities with 2+ DTRs and 2+ pulses.  Lymph: No lymphadenopathy of posterior or anterior cervical chain or axillae bilaterally.  Gait severely antalgic with gait with the aid of a cane MSK:  tender with limited range of motion and stability and strength and tone of shoulders, elbows, wrist, hip, knee and ankles bilaterally.  Right lower extremity does have weakness and seems to be 4-5 compared to the contralateral side.  Patient has some mild foot drop noted on the right side as well.  Seems to be though neurovascularly intact.   Impression and Recommendations:     This case required medical decision making of moderate complexity. The above documentation has been reviewed and is accurate and complete Lyndal Pulley, DO       Note: This dictation was prepared with Dragon dictation along with smaller phrase technology. Any transcriptional errors that result from this process are unintentional.

## 2018-02-21 ENCOUNTER — Encounter: Payer: Self-pay | Admitting: Family Medicine

## 2018-02-21 ENCOUNTER — Ambulatory Visit (INDEPENDENT_AMBULATORY_CARE_PROVIDER_SITE_OTHER): Payer: Medicare Other | Admitting: Family Medicine

## 2018-02-21 VITALS — BP 128/70 | HR 78 | Ht 60.0 in | Wt 107.0 lb

## 2018-02-21 DIAGNOSIS — M2550 Pain in unspecified joint: Secondary | ICD-10-CM | POA: Diagnosis not present

## 2018-02-21 DIAGNOSIS — R221 Localized swelling, mass and lump, neck: Secondary | ICD-10-CM

## 2018-02-21 DIAGNOSIS — M47812 Spondylosis without myelopathy or radiculopathy, cervical region: Secondary | ICD-10-CM

## 2018-02-21 NOTE — Patient Instructions (Signed)
Good to see you  I am sorry I do not have the magic wand.  pennsaid pinkie amount topically 2 times daily as needed.  I really want to recheck the calcium again around valentines day  Come to the basement and get it drawn  If still elevated we need to do a scan of the neck to see if we need to do anything with a glad you have there Otherwise keep trucking along for now.  We will be talking

## 2018-02-21 NOTE — Assessment & Plan Note (Signed)
Very concerned with patient having more of a hypercalcemia could be complained overall.  Both in addition to this patient has had increase in PTH and there is a possibility of a primary parathyroid hyper thyroidism.  Discussed with patient again at great length.  Patient wants to hold on any type of treatment at this time or any other further imaging.  Will repeat labs again in the near future.  No other change in management then.  Topical anti-inflammatories given.  Follow-up again 6 to 8 weeks

## 2018-02-21 NOTE — Assessment & Plan Note (Signed)
Severe.  Patient wants to hold on any type of injection and patient is on a blood thinner at this moment.  Wants to avoid any surgical intervention.  We will continue to monitor closely.  Encourage patient to take the gabapentin on a more regular basis.  Patient does not want to go up secondary to the side effects.  We will continue to monitor.  Follow-up again in 6 weeks

## 2018-02-24 ENCOUNTER — Ambulatory Visit: Payer: Medicare Other | Admitting: Family Medicine

## 2018-03-08 ENCOUNTER — Other Ambulatory Visit (HOSPITAL_COMMUNITY): Payer: Self-pay | Admitting: Cardiology

## 2018-03-09 ENCOUNTER — Other Ambulatory Visit: Payer: Self-pay | Admitting: *Deleted

## 2018-03-09 MED ORDER — FENTANYL 25 MCG/HR TD PT72: MEDICATED_PATCH | TRANSDERMAL | 0 refills | Status: DC

## 2018-03-09 NOTE — Telephone Encounter (Signed)
Patient requested refill NCCSRS Database Verified LR: 02/09/2018 Pended Rx and sent to Dr. Mariea Clonts for approval.

## 2018-03-14 DIAGNOSIS — H906 Mixed conductive and sensorineural hearing loss, bilateral: Secondary | ICD-10-CM | POA: Diagnosis not present

## 2018-03-14 DIAGNOSIS — H903 Sensorineural hearing loss, bilateral: Secondary | ICD-10-CM | POA: Diagnosis not present

## 2018-03-16 DIAGNOSIS — H353211 Exudative age-related macular degeneration, right eye, with active choroidal neovascularization: Secondary | ICD-10-CM | POA: Diagnosis not present

## 2018-03-22 ENCOUNTER — Other Ambulatory Visit: Payer: Self-pay | Admitting: *Deleted

## 2018-03-22 MED ORDER — TRAMADOL HCL 50 MG PO TABS: 50.0000 mg | ORAL_TABLET | Freq: Every day | ORAL | 0 refills | Status: DC

## 2018-03-22 NOTE — Telephone Encounter (Signed)
Patient requested refill.  [place] Verified LR: 02/20/2018 Phoned to pharmacy.

## 2018-03-24 ENCOUNTER — Other Ambulatory Visit: Payer: Medicare Other

## 2018-03-24 DIAGNOSIS — M2550 Pain in unspecified joint: Secondary | ICD-10-CM | POA: Diagnosis not present

## 2018-03-27 ENCOUNTER — Other Ambulatory Visit (HOSPITAL_COMMUNITY): Payer: Self-pay | Admitting: Cardiology

## 2018-03-28 LAB — PTH, INTACT AND CALCIUM
Calcium: 10.9 mg/dL — ABNORMAL HIGH (ref 8.6–10.4)
PTH: 171 pg/mL — ABNORMAL HIGH (ref 14–64)

## 2018-03-28 LAB — CALCIUM, IONIZED: Calcium, Ion: 5.93 mg/dL — ABNORMAL HIGH (ref 4.8–5.6)

## 2018-03-29 ENCOUNTER — Encounter: Payer: Self-pay | Admitting: Family Medicine

## 2018-03-29 ENCOUNTER — Ambulatory Visit: Payer: Medicare Other | Attending: Internal Medicine | Admitting: Occupational Therapy

## 2018-03-29 DIAGNOSIS — H53412 Scotoma involving central area, left eye: Secondary | ICD-10-CM | POA: Diagnosis not present

## 2018-03-29 NOTE — Patient Instructions (Signed)
Whenever you are reading remember to find the "sweet spot"  turn your head slightly to the right and raise your chin to locate your best vision.  You have been provided information regarding magnifiers from independent living aids. You may buy magnifiers from another source these are just an example.  You liked the 3x stand magnifier and the 4x handheld magnifier.   You have information regarding an ottlight with a 3x gooseneck magnifier for crafting.  You may want to consider a CCTV. Try to practice in the [place] at [place] to see if this would be a good purchase.  Consider a fine point sharpie or dark marker when writing. Printing may be easier to read.

## 2018-03-29 NOTE — Therapy (Signed)
[facility] [address] [place], [place], [address] Phone: [phone]   Fax:  [phone]  Occupational Therapy Treatment  Patient Details  Name: Sarah Mays MRN: [id number] Date of Birth: Feb 19, 1930 Referring Provider (OT): Dr. Zadie Rhine   Encounter Date: 03/29/2018  OT End of Session - 03/29/18 1225    Visit Number  1    Number of Visits  1    Authorization Type  Medicare, BCBS    OT Start Time  1761    OT Stop Time  1142    OT Time Calculation (min)  62 min    Activity Tolerance  Patient tolerated treatment well       Past Medical History:  Diagnosis Date  . Abnormal stress test    a. 11/2011 Ex MV: EF 80%, small, partially reversible anteroapical defect->mild ischemia vs attenuation-->med Rx.  [name] Kitchen Alopecia, unspecified   . Arthritis   . Chronic diastolic CHF (congestive heart failure) ([place])    a. 11/2013 Echo: EF 60-65%, no rwma, mild TR, PASP 43mmHg.  . Closed fracture of lower end of radius with ulna   . Deafness    Left, s/p multiple surgeries  . Essential hypertension   . Gouty arthropathy, unspecified   . History of kidney stones   . Hypothyroidism   . Insomnia, unspecified   . Macular degeneration    Left, s/p inj. tx  . Mitral valve disorders(424.0)   . Neck mass    right, w/u including MRI negative  . PAF (paroxysmal atrial fibrillation) (HCC)    a. on amio (02/2012 mild obstruction on PFT's) and xarelto.  . Polycythemia vera(238.4)   . Pure hypercholesterolemia   . Senile osteoporosis    Reclast in the past  . Tricuspid valve disorders, specified as nonrheumatic     Past Surgical History:  Procedure Laterality Date  . BREAST BIOPSY  06/18/1998   left  . CARDIOVERSION N/A 07/15/2017   Procedure: CARDIOVERSION;  Surgeon: Larey Dresser, MD;  Location: [facility] ENDOSCOPY;  Service: Cardiovascular;  Laterality: N/A;  . ELECTROPHYSIOLOGIC STUDY N/A 01/08/2016   Procedure: Atrial Fibrillation  Ablation;  Surgeon: Thompson Grayer, MD;  Location: [facility] LAB;  Service: Cardiovascular;  Laterality: N/A;  . FEMUR IM NAIL Right 10/08/2015   Procedure: INTRAMEDULLARY (IM) NAIL FEMORAL RIGHT;  Surgeon: Paralee Cancel, MD;  Location: [facility] ORS;  Service: Orthopedics;  Laterality: Right;  . MASS EXCISION Right 11/26/2016   Procedure: EXCISION RIGHT LATERAL NECK MASS;  Surgeon: Jerrell Belfast, MD;  Location: [facility];  Service: ENT;  Laterality: Right;  . TEE WITHOUT CARDIOVERSION N/A 07/15/2017   Procedure: TRANSESOPHAGEAL ECHOCARDIOGRAM (TEE);  Surgeon: Larey Dresser, MD;  Location: [facility] ENDOSCOPY;  Service: Cardiovascular;  Laterality: N/A;  . TONSILLECTOMY    . TOTAL ABDOMINAL HYSTERECTOMY    . TYMPANOPLASTY Bilateral     There were no vitals filed for this visit.  Subjective Assessment - 03/29/18 1034    Patient Stated Goals  to read easier/ better    Currently in Pain?  Yes    Pain Score  3     Pain Location  Back    Pain Orientation  Right    Pain Descriptors / Indicators  Aching    Pain Type  Chronic pain    Pain Onset  More than a month ago    Pain Frequency  Intermittent    Aggravating Factors   movement    Pain Relieving Factors  unknown -  OT will not address in the scope of this referral.        [facility] OT Assessment - 03/29/18 1039      Assessment   Medical Diagnosis  macular degeneration    Referring Provider (OT)  Dr. Zadie Rhine    Onset Date/Surgical Date  02/07/18      Precautions   Precautions  Fall      Balance Screen   Has the patient fallen in the past 6 months  No    Has the patient had a decrease in activity level because of a fear of falling?   No    Is the patient reluctant to leave their home because of a fear of falling?   No      Home  Environment   Family/patient expects to be discharged to:  --   Independent Living   Lives With  Alone      Prior Function   Level of Independence  Independent with basic ADLs;Independent with household mobility  with device    Leisure  painting      ADL   ADL comments  modified independent with all basic ADLS      IADL   Shopping  Assistance for transportation    [employer]  --   has assistance for cleaning   Meal Prep  Able to complete simple warm meal prep    Medication Management  Is responsible for taking medication in correct dosages at correct time    Financial Management  --   Pt's son handles     Mobility   Mobility Status  Independent   with rollator     Vision - History   Baseline Vision  Wears glasses all the time    Visual History  Macular degeneration    Patient Visual Report  Central vision impairment      Vision Assessment   Vision Assessment  Vision tested    Per MD/OD Report  OD 20/30-2, OS 20/200-1    Reading Acuity  (0.8)    Patient has diffculty with activities due to visual impairment  Reading bills   reading fine print, painting     Cognition   Overall Cognitive Status  Within Functional Limits for tasks assessed    Mini Mental State Exam   28/30               OT Treatments/Exercises (OP) - 03/29/18 0001      ADLs   ADL Comments  Pt was educated in eccentric viewing and use of 3x stand and 4x handheld magnifiers. Pt demonstrates ability to read continuous text following education. Pt was also educated regarding CCTV's, therapist recommends pt practices with use at [employer]. Pt verbalized understanding of all education. see pt instructions.              OT Education - 03/29/18 1223    Education Details  eccentric viewing, use of 3x stand and 4x hand held magnifier, recommendation for a gooseneck magnifier for crafting, information regarding CCTV's   Person(s) Educated  Patient    Methods  Explanation;Demonstration;Verbal cues;Handout    Comprehension  Verbalized understanding;Returned demonstration;Verbal cues required          OT Long Term Goals - 03/29/18 1231      OT LONG TERM GOAL #1   Title  n/a one time visit             Plan - 03/29/18 1226    Clinical Impression Statement  Pt  is a 85 y.o female with diagnosis of macular degeneration. Pt presents to occupational therapy with visual impairments which impede perfoemance of ADLS/ IADLs and reading ability. Pt can benefit from skilled occupational therapy to maximize pt's safety and I with ADLs/ IADLS and to improve reading ability. Pt was seen for education on day of evaluation and therefore additional visits are not recommended at this time.    Occupational Profile and client history currently impacting functional performance  Pt is retired and lives at [employer] in [employer]. See snapshot for PMH    Occupational performance deficits (Please refer to evaluation for details):  IADL's;ADL's;Leisure;Social Participation    Rehab Potential  Good    OT Frequency  One time visit    OT Duration  8 weeks    OT Treatment/Interventions  Self-care/ADL training;Visual/perceptual remediation/compensation;Patient/family education;DME and/or AE instruction    Plan  Pt was seen for education on day of evaluation, additional visits are not recommended at this time as pt demonstrates good understanding of all education. No goals were set .    Clinical Decision Making  Limited treatment options, no task modification necessary    Consulted and Agree with Plan of Care  Patient       Patient will benefit from skilled therapeutic intervention in order to improve the following deficits and impairments:  Impaired vision/preception  Visit Diagnosis: [place] involving central area, left eye - Plan: Ot plan of care cert/re-cert    Problem List Patient Active Problem List   Diagnosis Date Noted  . Neurogenic claudication due to lumbar spinal stenosis 01/28/2018  . Hydronephrosis with ureteropelvic junction (UPJ) obstruction 08/22/2017  . DDD (degenerative disc disease), cervical 08/09/2017  . Hypercoagulable state due to atrial fibrillation ([place]) 08/09/2017  .  Presbycusis of both ears 08/03/2017  . Deficiency anemia 07/26/2017  . Recurrent epistaxis 02/25/2017  . Iron deficiency anemia 02/21/2017  . Pure hypercholesterolemia   . PONV (postoperative nausea and vomiting)   . PAF (paroxysmal atrial fibrillation) ([place])   . Neck mass   . Insomnia, unspecified   . History of kidney stones   . Deafness   . Closed fracture of lower end of radius with ulna   . Arthritis   . Abnormal stress test   . Rotator cuff arthropathy, right 09/08/2016  . Trigger point of right shoulder region 05/05/2016  . Spondylosis of cervical region without myelopathy or radiculopathy 05/05/2016  . Chondrocalcinosis 05/05/2016  . Chronic pain disorder 05/05/2016  . A-fib ([place]) 01/08/2016  . Closed fracture of shaft of right femur, initial encounter ([place]) 10/25/2015  . Status post-operative repair of hip fracture 10/25/2015  . Greater trochanteric bursitis of right hip 09/29/2015  . Asymptomatic cholelithiasis 05/09/2015  . Essential hypertension 11/07/2014  . Hair loss 11/07/2014  . Bilateral leg edema 11/07/2014  . Constipation due to pain medication 11/06/2014  . Hypotension due to drugs 11/06/2014  . Chest pain 09/01/2014  . Osteoporosis 01/04/2014  . Gout 01/04/2014  . Headache 01/04/2014  . Hypothyroidism 11/19/2013  . Hypercalcemia 10/11/2013  . Insomnia 05/16/2013  . Lumbar back pain 05/16/2013  . Polycythemia vera ([place])   . Mass of right side of neck   . Senile osteoporosis   . Macular degeneration   . Sick sinus syndrome ([place]) 03/09/2012  . CAD (coronary artery disease) 12/27/2011  . Paroxysmal atrial fibrillation ([place]) 12/27/2011  . Chronic diastolic CHF (congestive heart failure) ([place]) 12/27/2011  . HTN (hypertension) 12/27/2011    RINE,KATHRYN 03/29/2018, 12:43 PM Curt Bears  Rine, OTR/L [phone] Phone: [phone] 12:43 PM 03/29/18 [facility] [address] [place], [place], [address] Phone: [phone]   Fax:  [phone]  Name: Sarah Mays MRN: [id number] Date of Birth: [birth year]

## 2018-03-30 ENCOUNTER — Other Ambulatory Visit: Payer: Self-pay

## 2018-03-30 DIAGNOSIS — M2550 Pain in unspecified joint: Secondary | ICD-10-CM

## 2018-03-30 DIAGNOSIS — E559 Vitamin D deficiency, unspecified: Secondary | ICD-10-CM

## 2018-04-03 ENCOUNTER — Encounter: Payer: Self-pay | Admitting: Internal Medicine

## 2018-04-03 ENCOUNTER — Ambulatory Visit (INDEPENDENT_AMBULATORY_CARE_PROVIDER_SITE_OTHER): Payer: Medicare Other | Admitting: Internal Medicine

## 2018-04-03 ENCOUNTER — Telehealth: Payer: Self-pay

## 2018-04-03 VITALS — BP 120/70 | HR 74 | Temp 98.1°F | Ht 60.0 in | Wt 102.0 lb

## 2018-04-03 DIAGNOSIS — H6122 Impacted cerumen, left ear: Secondary | ICD-10-CM | POA: Diagnosis not present

## 2018-04-03 DIAGNOSIS — J209 Acute bronchitis, unspecified: Secondary | ICD-10-CM | POA: Diagnosis not present

## 2018-04-03 MED ORDER — AZITHROMYCIN 250 MG PO TABS: ORAL_TABLET | ORAL | 0 refills | Status: DC

## 2018-04-03 MED ORDER — CEFTRIAXONE SODIUM 1 G IJ SOLR
1.0000 g | Freq: Once | INTRAMUSCULAR | Status: AC
  Administered 2018-04-03: 1 g via INTRAMUSCULAR

## 2018-04-03 NOTE — Telephone Encounter (Signed)
Patient was in office today and had left ear irrigated. We were unable to remove wax   Spoke with Leafy Ro at Dr.Shoemaker's office. Per patient request and Dr.Reed's directive patient needs to be seen today due to ear fullness altering hearing. Patient will see Dr.Rosen today @ 3:00 pm for Dr.Shoemaker is out of office until Wednesday.  Per Leafy Ro Dr.Rosen is on call and if he is called out of office patient will be contacted prior to appointment.   Patient aware of appointment date/time and circumstances or Dr.Rosen being on call   S.Chrae B/CMA

## 2018-04-03 NOTE — Progress Notes (Addendum)
Location:  [facility] clinic Provider:  Richardine Peppers L. Mariea Clonts, D.O., C.M.D.  Code Status: DNR Goals of Care:  Advanced Directives 03/29/2018  Does Patient Have a Medical Advance Directive? Yes  Type of Advance Directive Living will;Healthcare Power of Attorney  Does patient want to make changes to medical advance directive? Yes (Inpatient - patient requests chaplain consult to change a medical advance directive)  Copy of [place] in Chart? -  Would patient like information on creating a medical advance directive? -  Pre-existing out of facility DNR order (yellow form or pink MOST form) -     Chief Complaint  Patient presents with  . Acute Visit    cough, URI, Sinus congestion x5 days    HPI: Patient is a 85 y.o. female with h/o low back pain, hypothyroidism, chronic constipation (some opioid induced), hypertension, afib seen today for acute visit for cough, sinus congestion x 5 days and poor appetite.  It's unclear if she's taking gabapentin 100mg  at hs, 100mg  po tid or 300mg  at hs right now.  She's going to call back and tell us.    Frontal sinus congestion.  Started thursday last week--woke up hacking and thick yellow stuff came up that afternoon.  [place] recommended coricidin and mucinex.  Can't tell a difference.  Wakes up coughing, but it's not really keeping her awake.  Does not sleep soundly anyway.  Has had chills.  Has been afebrile.  Some pain in her chest.  Feels different.  It's a little pain here and there.  Has not analyzed it.  No appetite.  Lost weight and does not want to lose.  Down 5 lbs in a month at 102 lbs today.  Had been staying at 107.  No desire to eat.  Her daughter got her gatorade last night.  Food has no taste.     Past Medical History:  Diagnosis Date  . Abnormal stress test    a. 11/2011 Ex MV: EF 80%, small, partially reversible anteroapical defect->mild ischemia vs attenuation-->med Rx.  [name] Kitchen Alopecia, unspecified   . Arthritis   . Chronic  diastolic CHF (congestive heart failure) ([place])    a. 11/2013 Echo: EF 60-65%, no rwma, mild TR, PASP 12mmHg.  . Closed fracture of lower end of radius with ulna   . Deafness    Left, s/p multiple surgeries  . Essential hypertension   . Gouty arthropathy, unspecified   . History of kidney stones   . Hypothyroidism   . Insomnia, unspecified   . Macular degeneration    Left, s/p inj. tx  . Mitral valve disorders(424.0)   . Neck mass    right, w/u including MRI negative  . PAF (paroxysmal atrial fibrillation) (HCC)    a. on amio (02/2012 mild obstruction on PFT's) and xarelto.  . Polycythemia vera(238.4)   . Pure hypercholesterolemia   . Senile osteoporosis    Reclast in the past  . Tricuspid valve disorders, specified as nonrheumatic     Past Surgical History:  Procedure Laterality Date  . BREAST BIOPSY  06/18/1998   left  . CARDIOVERSION N/A 07/15/2017   Procedure: CARDIOVERSION;  Surgeon: Larey Dresser, MD;  Location: [facility] ENDOSCOPY;  Service: Cardiovascular;  Laterality: N/A;  . ELECTROPHYSIOLOGIC STUDY N/A 01/08/2016   Procedure: Atrial Fibrillation Ablation;  Surgeon: Thompson Grayer, MD;  Location: [place] CV LAB;  Service: Cardiovascular;  Laterality: N/A;  . FEMUR IM NAIL Right 10/08/2015   Procedure: INTRAMEDULLARY (IM) NAIL FEMORAL RIGHT;  Surgeon: Paralee Cancel, MD;  Location: [facility] ORS;  Service: Orthopedics;  Laterality: Right;  . MASS EXCISION Right 11/26/2016   Procedure: EXCISION RIGHT LATERAL NECK MASS;  Surgeon: Jerrell Belfast, MD;  Location: [place];  Service: ENT;  Laterality: Right;  . TEE WITHOUT CARDIOVERSION N/A 07/15/2017   Procedure: TRANSESOPHAGEAL ECHOCARDIOGRAM (TEE);  Surgeon: Larey Dresser, MD;  Location: [facility] ENDOSCOPY;  Service: Cardiovascular;  Laterality: N/A;  . TONSILLECTOMY    . TOTAL ABDOMINAL HYSTERECTOMY    . TYMPANOPLASTY Bilateral     Allergies  Allergen Reactions  . Amlodipine Swelling and Other (See Comments)    Reaction:  Lower  extremity swelling     Outpatient Encounter Medications as of 04/03/2018  Medication Sig  . acetaminophen (TYLENOL) 325 MG tablet Take 2 tablets (650 mg total) by mouth every 4 (four) hours as needed for mild pain or moderate pain. Not to exceed 3g in 24h  . Cholecalciferol (VITAMIN D) 2000 units CAPS Take 2,000 Units by mouth daily.   [name] Kitchen ELIQUIS 2.5 MG TABS tablet TAKE 1 TABLET BY MOUTH TWICE DAILY.  . fentaNYL (DURAGESIC) 25 MCG/HR Apply one patient onto skin every 3 (three) days for pain  . furosemide (LASIX) 40 MG tablet TAKE 1&1/2 TABLETS TWICE DAILY.  [name] Kitchen gabapentin (NEURONTIN) 100 MG capsule TAKE THREE (3) CAPSULES AT BEDTIME. (Patient taking differently: Takes only one 100 mg a day not 300mg )  . hydrALAZINE (APRESOLINE) 50 MG tablet TAKE 1 1/2 TABLETS THREE TIMES DAILY.  . hydroxyurea (HYDREA) 500 MG capsule 500 mg on Mondays, Wednesdays and Fridays and 1000 mg the other days of the week by mouth  . levothyroxine (SYNTHROID, LEVOTHROID) 100 MCG tablet TAKE 1 TABLET ONCE DAILY BEFORE BREAKFAST.  . Magnesium 250 MG TABS Take 250 mg by mouth daily.   . Melatonin 10 MG TABS Take 10 mg by mouth at bedtime.  . Multiple Vitamins-Minerals (PRESERVISION AREDS 2) CAPS Take 1 capsule by mouth 2 (two) times daily.   Melynda Ripple Tosylate (SYMPROIC) 0.2 MG TABS Take 1 tablet by mouth daily.  . naloxegol oxalate (MOVANTIK) 25 MG TABS tablet Take 1 tablet (25 mg total) by mouth daily.  . potassium chloride (K-DUR) 10 MEQ tablet TAKE 1 TABLET BY MOUTH TWICE DAILY.  [name] Kitchen senna-docusate (SENOKOT-S) 8.6-50 MG tablet Take 2 tablets by mouth 2 (two) times daily.  . sodium chloride (OCEAN) 0.65 % SOLN nasal spray Place 2 sprays into both nostrils as needed for congestion.  . traMADol (ULTRAM) 50 MG tablet Take 1 tablet (50 mg total) by mouth at bedtime.  . Vitamin D, Ergocalciferol, (DRISDOL) 1.25 MG (50000 UT) CAPS capsule Take 1 capsule (50,000 Units total) by mouth every 7 (seven) days.   No  facility-administered encounter medications on file as of 04/03/2018.     Review of Systems:  Review of Systems  Constitutional: Positive for chills, malaise/fatigue and weight loss. Negative for diaphoresis and fever.  HENT: Positive for congestion, hearing loss and sinus pain. Negative for ear pain and sore throat.        Ears feel blocked  Respiratory: Positive for cough and sputum production. Negative for shortness of breath and wheezing.   Cardiovascular: Positive for chest pain. Negative for palpitations and leg swelling.  Gastrointestinal: Positive for constipation. Negative for abdominal pain, diarrhea, nausea and vomiting.  Genitourinary: Negative for dysuria.  Musculoskeletal: Positive for back pain and myalgias. Negative for falls.  Skin: Negative for rash.  Neurological: Positive for headaches. Negative for dizziness and loss  of consciousness.  Endo/Heme/Allergies: Does not bruise/bleed easily.  Psychiatric/Behavioral: Negative for depression and memory loss. The patient is not nervous/anxious.        Some caregiver stress and seems down about husband's dementia and daughter-in-law's brain tumor    Health Maintenance  Topic Date Due  . INFLUENZA VACCINE  09/08/2017  . TETANUS/TDAP  01/13/2027  . DEXA SCAN  Completed  . PNA vac Low Risk Adult  Completed    Physical Exam: Vitals:   04/03/18 0907  BP: 120/70  Pulse: 74  Temp: 98.1 F (36.7 C)  TempSrc: Oral  SpO2: 95%  Weight: 102 lb (46.3 kg)  Height: 5' (1.524 m)   Body mass index is 19.92 kg/m. Physical Exam Vitals signs reviewed.  Constitutional:      Appearance: She is ill-appearing.     Comments: Has notably lost weight since I saw her with her husband a couple of weeks ago  HENT:     Head: Normocephalic and atraumatic.     Right Ear: Ear canal and external ear normal.     Left Ear: There is impacted cerumen.     Ears:     Comments: Right TM dull Cardiovascular:     Rate and Rhythm: Normal rate and  regular rhythm.     Pulses: Normal pulses.     Heart sounds: Normal heart sounds.  Pulmonary:     Effort: Pulmonary effort is normal.     Breath sounds: No rhonchi.  Musculoskeletal: Normal range of motion.  Lymphadenopathy:     Cervical: Cervical adenopathy present.  Skin:    General: Skin is warm and dry.  Neurological:     General: No focal deficit present.     Mental Status: She is alert and oriented to person, place, and time.     Comments: Ambulates with rolling walker  Psychiatric:        Mood and Affect: Mood normal.     Labs reviewed: Basic Metabolic Panel: Recent Labs    07/28/17  10/18/17 1621 12/22/17 1446 01/20/18 1545 01/27/18 1140 03/24/18 1410  NA 141   < > 140 137 138  --   --   K 5.0   < > 3.5 3.7 3.6  --   --   CL  --   --  100 102 99  --   --   CO2  --   --  31 26 26   --   --   GLUCOSE  --   --  64* 120* 99  --   --   BUN 38*   < > 18 22 26*  --   --   CREATININE 0.7   < > 0.65 0.95 0.90  --   --   CALCIUM  --   --  11.0* 10.9* 11.2* 11.8* 10.9*  TSH 1.37  --   --   --   --   --   --    < > = values in this interval not displayed.   Liver Function Tests: Recent Labs    07/26/17 0954 07/28/17 0800  AST 24 23  ALT 30 30  ALKPHOS 47 62  BILITOT 0.5  --   PROT 5.5*  --   ALBUMIN 3.8  --    No results for input(s): LIPASE, AMYLASE in the last 8760 hours. No results for input(s): AMMONIA in the last 8760 hours. CBC: Recent Labs    07/26/17 0954  07/29/17 1305 08/10/17 0135 11/17/17 1327 01/20/18  1545  WBC 11.8*   < > 13.2* 9.4 7.7 7.6  NEUTROABS 10.0*  --  11.0*  --  5.5  --   HGB 11.6*   < > 12.4 12.1 12.2 12.0  HCT 36.4   < > 38.2 36.8 37.2 37.7  MCV 112.3*  --  113.4* 112.5* 111.7* 114.6*  PLT 523*   < > 543* 358 458* 460*   < > = values in this interval not displayed.   Lipid Panel: Recent Labs    07/28/17  CHOL 194  HDL 72*  LDLCALC 102  TRIG 99   No results found for: HGBA1C  Procedures since last visit: No results  found.  Assessment/Plan 1. Acute bronchitis, unspecified organism - I'm concerned she is developing pneumonia with her severe malaise and fatigue and loss of appetite - I did not hear abnormal lung sounds, however -opted to treat her given 5 lb wt loss since I last saw her - azithromycin (ZITHROMAX) 250 MG tablet; 2 tablets today, then one daily for 4 days  Dispense: 6 tablet; Refill: 0 - cefTRIAXone (ROCEPHIN) injection 1 g -cont cough syrup and mucinex -push fluids -boost/ensure, gatorade  2.  Hearing loss left ear with cerumen impaction--rock hard piece of cerumen in canal--attempted both flushing with warm water and peroxide and manual dislodging with plastic curette and metal tweezers neither of which were successful--referred on to ENT at her request  Labs/tests ordered:  No orders of the defined types were placed in this encounter.  Next appt:  06/07/2018   Arish Redner L. Cinque Begley, D.O. [facility] [address], [place] [address] Cell Phone (Mon-Fri 8am-5pm):  [phone] On Call:  [phone] & follow prompts after 5pm & weekends Office Phone:  [phone] Office Fax:  [phone]

## 2018-04-03 NOTE — Patient Instructions (Addendum)
Call us back with how you are taking your gabapentin.  Drink plenty of fluids and rest.    Start the zpak antibiotic as soon as you receive it.  Eat yogurt daily while taking it.  Gatorade is ok to drink for some energy.  You could also take boost or ensure if you are unable to eat and can tolerate it.  Let me know if by next week your appetite remains so poor.

## 2018-04-04 ENCOUNTER — Inpatient Hospital Stay: Payer: Medicare Other | Admitting: Hematology and Oncology

## 2018-04-04 ENCOUNTER — Inpatient Hospital Stay: Payer: Medicare Other

## 2018-04-04 ENCOUNTER — Telehealth: Payer: Self-pay | Admitting: *Deleted

## 2018-04-04 MED ORDER — GABAPENTIN 100 MG PO CAPS: 100.0000 mg | ORAL_CAPSULE | Freq: Every day | ORAL | 1 refills | Status: DC

## 2018-04-04 NOTE — Telephone Encounter (Signed)
Patient called and stated that you wanted her to call back with her Gabapentin directions. She stated that she takes Gabapentin 100mg  once at bedtime.   Updated medication list.

## 2018-04-04 NOTE — Telephone Encounter (Signed)
Thank you :)

## 2018-04-05 DIAGNOSIS — H66012 Acute suppurative otitis media with spontaneous rupture of ear drum, left ear: Secondary | ICD-10-CM | POA: Diagnosis not present

## 2018-04-06 ENCOUNTER — Other Ambulatory Visit (HOSPITAL_COMMUNITY): Payer: Self-pay | Admitting: Cardiology

## 2018-04-10 ENCOUNTER — Other Ambulatory Visit: Payer: Self-pay | Admitting: *Deleted

## 2018-04-10 MED ORDER — FENTANYL 25 MCG/HR TD PT72: MEDICATED_PATCH | TRANSDERMAL | 0 refills | Status: DC

## 2018-04-10 NOTE — Telephone Encounter (Signed)
Patient requested refill NCCSRS Database Verified LR: 03/09/2018 Pended Rx and sent to Dr. Mariea Clonts for approval.

## 2018-04-11 DIAGNOSIS — H60502 Unspecified acute noninfective otitis externa, left ear: Secondary | ICD-10-CM | POA: Diagnosis not present

## 2018-04-11 DIAGNOSIS — Z974 Presence of external hearing-aid: Secondary | ICD-10-CM | POA: Diagnosis not present

## 2018-04-11 DIAGNOSIS — Z7289 Other problems related to lifestyle: Secondary | ICD-10-CM | POA: Diagnosis not present

## 2018-04-11 DIAGNOSIS — H903 Sensorineural hearing loss, bilateral: Secondary | ICD-10-CM | POA: Diagnosis not present

## 2018-04-11 DIAGNOSIS — Z87891 Personal history of nicotine dependence: Secondary | ICD-10-CM | POA: Diagnosis not present

## 2018-04-13 DIAGNOSIS — H353112 Nonexudative age-related macular degeneration, right eye, intermediate dry stage: Secondary | ICD-10-CM | POA: Diagnosis not present

## 2018-04-13 DIAGNOSIS — H353222 Exudative age-related macular degeneration, left eye, with inactive choroidal neovascularization: Secondary | ICD-10-CM | POA: Diagnosis not present

## 2018-04-13 DIAGNOSIS — H353211 Exudative age-related macular degeneration, right eye, with active choroidal neovascularization: Secondary | ICD-10-CM | POA: Diagnosis not present

## 2018-04-13 DIAGNOSIS — H353124 Nonexudative age-related macular degeneration, left eye, advanced atrophic with subfoveal involvement: Secondary | ICD-10-CM | POA: Diagnosis not present

## 2018-04-14 ENCOUNTER — Inpatient Hospital Stay (HOSPITAL_BASED_OUTPATIENT_CLINIC_OR_DEPARTMENT_OTHER): Payer: Medicare Other | Admitting: Hematology and Oncology

## 2018-04-14 ENCOUNTER — Inpatient Hospital Stay: Payer: Medicare Other | Attending: Hematology and Oncology

## 2018-04-14 ENCOUNTER — Other Ambulatory Visit: Payer: Self-pay | Admitting: Hematology and Oncology

## 2018-04-14 ENCOUNTER — Encounter: Payer: Self-pay | Admitting: Hematology and Oncology

## 2018-04-14 DIAGNOSIS — D45 Polycythemia vera: Secondary | ICD-10-CM | POA: Diagnosis not present

## 2018-04-14 DIAGNOSIS — D539 Nutritional anemia, unspecified: Secondary | ICD-10-CM

## 2018-04-14 DIAGNOSIS — D630 Anemia in neoplastic disease: Secondary | ICD-10-CM | POA: Insufficient documentation

## 2018-04-14 DIAGNOSIS — D500 Iron deficiency anemia secondary to blood loss (chronic): Secondary | ICD-10-CM

## 2018-04-14 LAB — CBC WITH DIFFERENTIAL/PLATELET
ABS IMMATURE GRANULOCYTES: 0.04 10*3/uL (ref 0.00–0.07)
BASOS ABS: 0 10*3/uL (ref 0.0–0.1)
BASOS PCT: 0 %
EOS ABS: 0 10*3/uL (ref 0.0–0.5)
Eosinophils Relative: 1 %
HEMATOCRIT: 35.9 % — AB (ref 36.0–46.0)
Hemoglobin: 11.6 g/dL — ABNORMAL LOW (ref 12.0–15.0)
IMMATURE GRANULOCYTES: 1 %
LYMPHS ABS: 1.2 10*3/uL (ref 0.7–4.0)
Lymphocytes Relative: 17 %
MCH: 36.9 pg — ABNORMAL HIGH (ref 26.0–34.0)
MCHC: 32.3 g/dL (ref 30.0–36.0)
MCV: 114.3 fL — AB (ref 80.0–100.0)
MONOS PCT: 9 %
Monocytes Absolute: 0.7 10*3/uL (ref 0.1–1.0)
NEUTROS ABS: 5.1 10*3/uL (ref 1.7–7.7)
NEUTROS PCT: 72 %
NRBC: 0 % (ref 0.0–0.2)
PLATELETS: 410 10*3/uL — AB (ref 150–400)
RBC: 3.14 MIL/uL — ABNORMAL LOW (ref 3.87–5.11)
RDW: 15.8 % — AB (ref 11.5–15.5)
WBC: 7.1 10*3/uL (ref 4.0–10.5)

## 2018-04-14 LAB — IRON AND TIBC
IRON: 98 ug/dL (ref 28–170)
SATURATION RATIOS: 34 % — AB (ref 10.4–31.8)
TIBC: 291 ug/dL (ref 250–450)
UIBC: 193 ug/dL

## 2018-04-14 LAB — VITAMIN B12: VITAMIN B 12: 364 pg/mL (ref 180–914)

## 2018-04-14 LAB — FERRITIN: Ferritin: 39 ng/mL (ref 11–307)

## 2018-04-14 LAB — SEDIMENTATION RATE: Sed Rate: 2 mm/hr (ref 0–22)

## 2018-04-14 NOTE — Assessment & Plan Note (Signed)
This is likely anemia of chronic disease. The patient denies recent history of bleeding such as epistaxis, hematuria or hematochezia. She is asymptomatic from the anemia. We will observe for now.  

## 2018-04-14 NOTE — Progress Notes (Signed)
[facility] OFFICE PROGRESS NOTE  Patient Care Team: Gayland Curry, DO as PCP - General (Geriatric Medicine) Larey Dresser, MD as PCP - Cardiology (Cardiology) [facility], Clent Demark, MD as Consulting Physician (Ophthalmology) Larey Dresser, MD as Consulting Physician (Cardiology) Heath Lark, MD as Consulting Physician (Hematology and Oncology) Clent Jacks, MD as Consulting Physician (Ophthalmology) Jerrell Belfast, MD as Consulting Physician (Otolaryngology)  ASSESSMENT & PLAN:  Polycythemia vera Her CBC is stable The patient stated Sarah Mays has been compliant taking medications as directed Sarah Mays would take Hydroxyurea 500 mg on Mondays, Wednesdays and Fridays and to take 1000 mg for the rest of the week Her platelet count has improved compared to previous visits I do not plan to adjust the dose of her treatment Plan to see her back in 4 months for further follow-up Sarah Mays is educated to watch out for signs and symptoms of bleeding or infection  Anemia in neoplastic disease This is likely anemia of chronic disease. The patient denies recent history of bleeding such as epistaxis, hematuria or hematochezia. Sarah Mays is asymptomatic from the anemia. We will observe for now.     No orders of the defined types were placed in this encounter.   INTERVAL HISTORY: Please see below for problem oriented charting. Sarah Mays returns for follow-up Sarah Mays was treated with antibiotics for sinusitis The patient denies any recent signs or symptoms of bleeding such as spontaneous epistaxis, hematuria or hematochezia. Sarah Mays is compliant taking Hydrea as directed  SUMMARY OF ONCOLOGIC HISTORY:   Polycythemia vera ([place])   08/05/2011 Pathology Results    Peripheral blood JAK2 mutation was positive with low serum erythropoietin level. Bone marrow aspirate and biopsy was not performed.    08/12/2011 -  Chemotherapy    Sarah Mays is started on hydroxyurea with periodic  phlebotomy.     REVIEW OF SYSTEMS:   Constitutional: Denies fevers, chills or abnormal weight loss Eyes: Denies blurriness of vision Ears, nose, mouth, throat, and face: Denies mucositis or sore throat Respiratory: Denies cough, dyspnea or wheezes Cardiovascular: Denies palpitation, chest discomfort or lower extremity swelling Gastrointestinal:  Denies nausea, heartburn or change in bowel habits Skin: Denies abnormal skin rashes Lymphatics: Denies new lymphadenopathy or easy bruising Neurological:Denies numbness, tingling or new weaknesses Behavioral/Psych: Mood is stable, no new changes  All other systems were reviewed with the patient and are negative.  I have reviewed the past medical history, past surgical history, social history and family history with the patient and they are unchanged from previous note.  ALLERGIES:  is allergic to amlodipine.  MEDICATIONS:  Current Outpatient Medications  Medication Sig Dispense Refill  . acetaminophen (TYLENOL) 325 MG tablet Take 2 tablets (650 mg total) by mouth every 4 (four) hours as needed for mild pain or moderate pain. Not to exceed 3g in 24h 120 tablet 0  . azithromycin (ZITHROMAX) 250 MG tablet 2 tablets today, then one daily for 4 days 6 tablet 0  . Cholecalciferol (VITAMIN D) 2000 units CAPS Take 2,000 Units by mouth daily.     [name] Kitchen ELIQUIS 2.5 MG TABS tablet TAKE 1 TABLET BY MOUTH TWICE DAILY. 60 tablet 0  . fentaNYL (DURAGESIC) 25 MCG/HR Apply one patient onto skin every 3 (three) days for pain 10 patch 0  . furosemide (LASIX) 40 MG tablet TAKE 1&1/2 TABLETS TWICE DAILY. 90 tablet 0  . gabapentin (NEURONTIN) 100 MG capsule Take 1 capsule (100 mg total) by mouth at bedtime. 30 capsule 1  .  hydrALAZINE (APRESOLINE) 50 MG tablet TAKE 1 1/2 TABLETS THREE TIMES DAILY. 135 tablet 0  . hydroxyurea (HYDREA) 500 MG capsule 500 mg on Mondays, Wednesdays and Fridays and 1000 mg the other days of the week by mouth 100 capsule 9  . levothyroxine  (SYNTHROID, LEVOTHROID) 100 MCG tablet TAKE 1 TABLET ONCE DAILY BEFORE BREAKFAST. 90 tablet 1  . Magnesium 250 MG TABS Take 250 mg by mouth daily.     . Melatonin 10 MG TABS Take 10 mg by mouth at bedtime.    . Multiple Vitamins-Minerals (PRESERVISION AREDS 2) CAPS Take 1 capsule by mouth 2 (two) times daily.     Melynda Ripple Tosylate (SYMPROIC) 0.2 MG TABS Take 1 tablet by mouth daily. 7 tablet 0  . naloxegol oxalate (MOVANTIK) 25 MG TABS tablet Take 1 tablet (25 mg total) by mouth daily. 30 tablet 3  . potassium chloride (K-DUR) 10 MEQ tablet TAKE 1 TABLET BY MOUTH TWICE DAILY. 60 tablet 6  . senna-docusate (SENOKOT-S) 8.6-50 MG tablet Take 2 tablets by mouth 2 (two) times daily.    . sodium chloride (OCEAN) 0.65 % SOLN nasal spray Place 2 sprays into both nostrils as needed for congestion.    . traMADol (ULTRAM) 50 MG tablet Take 1 tablet (50 mg total) by mouth at bedtime. 30 tablet 0  . Vitamin D, Ergocalciferol, (DRISDOL) 1.25 MG (50000 UT) CAPS capsule Take 1 capsule (50,000 Units total) by mouth every 7 (seven) days. 12 capsule 0   No current facility-administered medications for this visit.     PHYSICAL EXAMINATION: ECOG PERFORMANCE STATUS: 0 - Asymptomatic  Vitals:   04/14/18 1222  BP: 122/77  Pulse: 91  Resp: 18  Temp: 98 F (36.7 C)  SpO2: 99%   Filed Weights   04/14/18 1222  Weight: 106 lb (48.1 kg)    GENERAL:alert, no distress and comfortable SKIN: skin color, texture, turgor are normal, no rashes or significant lesions EYES: normal, Conjunctiva are pink and non-injected, sclera clear OROPHARYNX:no exudate, no erythema and lips, buccal mucosa, and tongue normal  NECK: supple, thyroid normal size, non-tender, without nodularity LYMPH:  no palpable lymphadenopathy in the cervical, axillary or inguinal LUNGS: clear to auscultation and percussion with normal breathing effort HEART: regular rate & rhythm and no murmurs and no lower extremity edema ABDOMEN:abdomen  soft, non-tender and normal bowel sounds Musculoskeletal:no cyanosis of digits and no clubbing  NEURO: alert & oriented x 3 with fluent speech, no focal motor/sensory deficits  LABORATORY DATA:  I have reviewed the data as listed    Component Value Date/Time   NA 138 01/20/2018 1545   NA 136 (A) 08/10/2017   NA 136 09/14/2016 1428   K 3.6 01/20/2018 1545   K 4.0 09/14/2016 1428   CL 99 01/20/2018 1545   CO2 26 01/20/2018 1545   CO2 30 (H) 09/14/2016 1428   GLUCOSE 99 01/20/2018 1545   GLUCOSE 112 09/14/2016 1428   BUN 26 (H) 01/20/2018 1545   BUN 20 08/10/2017   BUN 14.7 09/14/2016 1428   CREATININE 0.90 01/20/2018 1545   CREATININE 0.8 09/14/2016 1428   CALCIUM 10.9 (H) 03/24/2018 1410   CALCIUM 11.0 (H) 09/14/2016 1428   PROT 5.5 (L) 07/26/2017 0954   PROT 7.0 09/14/2016 1428   ALBUMIN 3.8 07/26/2017 0954   ALBUMIN 4.6 09/14/2016 1428   AST 23 07/28/2017 0800   AST 17 09/14/2016 1428   ALT 30 07/28/2017 0800   ALT 18 09/14/2016 1428   ALKPHOS  62 07/28/2017 0800   ALKPHOS 62 09/14/2016 1428   BILITOT 0.5 07/26/2017 0954   BILITOT 1.00 09/14/2016 1428   GFRNONAA 58 (L) 01/20/2018 1545   GFRAA >60 01/20/2018 1545    No results found for: SPEP, UPEP  Lab Results  Component Value Date   WBC 7.1 04/14/2018   NEUTROABS 5.1 04/14/2018   HGB 11.6 (L) 04/14/2018   HCT 35.9 (L) 04/14/2018   MCV 114.3 (H) 04/14/2018   PLT 410 (H) 04/14/2018      Chemistry      Component Value Date/Time   NA 138 01/20/2018 1545   NA 136 (A) 08/10/2017   NA 136 09/14/2016 1428   K 3.6 01/20/2018 1545   K 4.0 09/14/2016 1428   CL 99 01/20/2018 1545   CO2 26 01/20/2018 1545   CO2 30 (H) 09/14/2016 1428   BUN 26 (H) 01/20/2018 1545   BUN 20 08/10/2017   BUN 14.7 09/14/2016 1428   CREATININE 0.90 01/20/2018 1545   CREATININE 0.8 09/14/2016 1428   GLU 76 08/10/2017      Component Value Date/Time   CALCIUM 10.9 (H) 03/24/2018 1410   CALCIUM 11.0 (H) 09/14/2016 1428   ALKPHOS 62  07/28/2017 0800   ALKPHOS 62 09/14/2016 1428   AST 23 07/28/2017 0800   AST 17 09/14/2016 1428   ALT 30 07/28/2017 0800   ALT 18 09/14/2016 1428   BILITOT 0.5 07/26/2017 0954   BILITOT 1.00 09/14/2016 1428       All questions were answered. The patient knows to call the clinic with any problems, questions or concerns. No barriers to learning was detected.  I spent 10 minutes counseling the patient face to face. The total time spent in the appointment was 15 minutes and more than 50% was on counseling and review of test results  Heath Lark, MD 04/14/2018 2:19 PM

## 2018-04-14 NOTE — Assessment & Plan Note (Signed)
Her CBC is stable The patient stated she has been compliant taking medications as directed She would take Hydroxyurea 500 mg on Mondays, Wednesdays and Fridays and to take 1000 mg for the rest of the week Her platelet count has improved compared to previous visits I do not plan to adjust the dose of her treatment Plan to see her back in 4 months for further follow-up She is educated to watch out for signs and symptoms of bleeding or infection

## 2018-04-21 ENCOUNTER — Other Ambulatory Visit: Payer: Self-pay | Admitting: Internal Medicine

## 2018-04-24 DIAGNOSIS — H906 Mixed conductive and sensorineural hearing loss, bilateral: Secondary | ICD-10-CM | POA: Diagnosis not present

## 2018-04-26 ENCOUNTER — Other Ambulatory Visit (HOSPITAL_COMMUNITY): Payer: Self-pay | Admitting: Cardiology

## 2018-05-05 ENCOUNTER — Other Ambulatory Visit (HOSPITAL_COMMUNITY): Payer: Medicare Other

## 2018-05-05 ENCOUNTER — Encounter (HOSPITAL_COMMUNITY): Payer: Medicare Other | Admitting: Cardiology

## 2018-05-06 ENCOUNTER — Other Ambulatory Visit (HOSPITAL_COMMUNITY): Payer: Self-pay | Admitting: Cardiology

## 2018-05-08 ENCOUNTER — Other Ambulatory Visit: Payer: Self-pay | Admitting: *Deleted

## 2018-05-08 MED ORDER — FENTANYL 25 MCG/HR TD PT72: MEDICATED_PATCH | TRANSDERMAL | 0 refills | Status: DC

## 2018-05-08 NOTE — Telephone Encounter (Signed)
Patient requested refill NCCSRS Database Verified LR: 04/10/2018 Pended Rx and sent to Dr. Mariea Clonts for approval.

## 2018-05-22 ENCOUNTER — Other Ambulatory Visit: Payer: Self-pay | Admitting: Internal Medicine

## 2018-05-23 DIAGNOSIS — H353222 Exudative age-related macular degeneration, left eye, with inactive choroidal neovascularization: Secondary | ICD-10-CM | POA: Diagnosis not present

## 2018-05-23 DIAGNOSIS — H353112 Nonexudative age-related macular degeneration, right eye, intermediate dry stage: Secondary | ICD-10-CM | POA: Diagnosis not present

## 2018-05-23 DIAGNOSIS — H353211 Exudative age-related macular degeneration, right eye, with active choroidal neovascularization: Secondary | ICD-10-CM | POA: Diagnosis not present

## 2018-06-07 ENCOUNTER — Non-Acute Institutional Stay: Payer: Medicare Other | Admitting: Internal Medicine

## 2018-06-07 ENCOUNTER — Encounter: Payer: Self-pay | Admitting: Internal Medicine

## 2018-06-07 ENCOUNTER — Other Ambulatory Visit: Payer: Self-pay

## 2018-06-07 VITALS — BP 100/60 | HR 68 | Temp 98.6°F | Ht 60.0 in | Wt 107.0 lb

## 2018-06-07 DIAGNOSIS — H6592 Unspecified nonsuppurative otitis media, left ear: Secondary | ICD-10-CM | POA: Diagnosis not present

## 2018-06-07 DIAGNOSIS — I5032 Chronic diastolic (congestive) heart failure: Secondary | ICD-10-CM | POA: Diagnosis not present

## 2018-06-07 DIAGNOSIS — M8000XS Age-related osteoporosis with current pathological fracture, unspecified site, sequela: Secondary | ICD-10-CM | POA: Diagnosis not present

## 2018-06-07 DIAGNOSIS — K5903 Drug induced constipation: Secondary | ICD-10-CM

## 2018-06-07 DIAGNOSIS — M545 Low back pain, unspecified: Secondary | ICD-10-CM

## 2018-06-07 MED ORDER — FENTANYL 25 MCG/HR TD PT72: MEDICATED_PATCH | TRANSDERMAL | 0 refills | Status: DC

## 2018-06-07 MED ORDER — AMOXICILLIN-POT CLAVULANATE 875-125 MG PO TABS: 1.0000 | ORAL_TABLET | Freq: Two times a day (BID) | ORAL | 0 refills | Status: DC

## 2018-06-07 NOTE — Progress Notes (Signed)
Location:  Occupational psychologist of Service:  Clinic (12)  Provider: Anderson Middlebrooks L. Mariea Clonts, D.O., C.M.D.  Code Status: DNR Goals of Care:  Advanced Directives 06/07/2018  Does Patient Have a Medical Advance Directive? Yes  Type of Paramedic of [place];Living will;Out of facility DNR (pink MOST or yellow form)  Does patient want to make changes to medical advance directive? No - Patient declined  Copy of [place] in Chart? Yes - validated most recent copy scanned in chart (See row information)  Would patient like information on creating a medical advance directive? -  Pre-existing out of facility DNR order (yellow form or pink MOST form) Yellow form placed in chart (order not valid for inpatient use)     Chief Complaint  Patient presents with  . Medical Management of Chronic Issues    55mth follow-up    HPI: Patient is a 85 y.o. female seen today for medical management of chronic diseases.    Left ear hurt her for a day and a 1/2.  Took tylenol at 1am this am and it went away.  She had sinus infection--went to ENT and her ears had not drained, but built up wax.  She got up on ear drops.  After she got the wax out, the left ear healed.  Then the right ear hurt, she called.  She couldn't get in.  She was told to do the ear drops for the right ear.  No congestion now.  She did have some residual pain over her right eye, but no congestion.    Worries about her husband not eating well b/c he was telling her he had several meals in his fridge.  He also has been a lot more confused more often.  He's not getting out of his apt at all now which worries her.     Past Medical History:  Diagnosis Date  . Abnormal stress test    a. 11/2011 Ex MV: EF 80%, small, partially reversible anteroapical defect->mild ischemia vs attenuation-->med Rx.  [name] Kitchen Alopecia, unspecified   . Arthritis   . Chronic diastolic CHF (congestive heart failure)  ([place])    a. 11/2013 Echo: EF 60-65%, no rwma, mild TR, PASP 37mmHg.  . Closed fracture of lower end of radius with ulna   . Deafness    Left, s/p multiple surgeries  . Essential hypertension   . Gouty arthropathy, unspecified   . History of kidney stones   . Hypothyroidism   . Insomnia, unspecified   . Macular degeneration    Left, s/p inj. tx  . Mitral valve disorders(424.0)   . Neck mass    right, w/u including MRI negative  . PAF (paroxysmal atrial fibrillation) (HCC)    a. on amio (02/2012 mild obstruction on PFT's) and xarelto.  . Polycythemia vera(238.4)   . Pure hypercholesterolemia   . Senile osteoporosis    Reclast in the past  . Tricuspid valve disorders, specified as nonrheumatic     Past Surgical History:  Procedure Laterality Date  . BREAST BIOPSY  06/18/1998   left  . CARDIOVERSION N/A 07/15/2017   Procedure: CARDIOVERSION;  Surgeon: Larey Dresser, MD;  Location: [facility] ENDOSCOPY;  Service: Cardiovascular;  Laterality: N/A;  . ELECTROPHYSIOLOGIC STUDY N/A 01/08/2016   Procedure: Atrial Fibrillation Ablation;  Surgeon: Thompson Grayer, MD;  Location: [place] CV LAB;  Service: Cardiovascular;  Laterality: N/A;  . FEMUR IM NAIL Right 10/08/2015   Procedure: INTRAMEDULLARY (IM)  NAIL FEMORAL RIGHT;  Surgeon: Paralee Cancel, MD;  Location: [facility] ORS;  Service: Orthopedics;  Laterality: Right;  . MASS EXCISION Right 11/26/2016   Procedure: EXCISION RIGHT LATERAL NECK MASS;  Surgeon: Jerrell Belfast, MD;  Location: [place];  Service: ENT;  Laterality: Right;  . TEE WITHOUT CARDIOVERSION N/A 07/15/2017   Procedure: TRANSESOPHAGEAL ECHOCARDIOGRAM (TEE);  Surgeon: Larey Dresser, MD;  Location: [facility] ENDOSCOPY;  Service: Cardiovascular;  Laterality: N/A;  . TONSILLECTOMY    . TOTAL ABDOMINAL HYSTERECTOMY    . TYMPANOPLASTY Bilateral     Allergies  Allergen Reactions  . Amlodipine Swelling and Other (See Comments)    Reaction:  Lower extremity swelling     Outpatient Encounter  Medications as of 06/07/2018  Medication Sig  . acetaminophen (TYLENOL) 325 MG tablet Take 2 tablets (650 mg total) by mouth every 4 (four) hours as needed for mild pain or moderate pain. Not to exceed 3g in 24h  . Cholecalciferol (VITAMIN D) 2000 units CAPS Take 2,000 Units by mouth daily.   [name] Kitchen ELIQUIS 2.5 MG TABS tablet TAKE 1 TABLET BY MOUTH TWICE DAILY.  . fentaNYL (DURAGESIC) 25 MCG/HR Apply one patient onto skin every 3 (three) days for pain  . furosemide (LASIX) 40 MG tablet TAKE 1&1/2 TABLETS TWICE DAILY.  [name] Kitchen gabapentin (NEURONTIN) 100 MG capsule TAKE THREE (3) CAPSULES AT BEDTIME.  . hydrALAZINE (APRESOLINE) 50 MG tablet TAKE 1 1/2 TABLETS THREE TIMES DAILY.  . hydroxyurea (HYDREA) 500 MG capsule 500 mg on Mondays, Wednesdays and Fridays and 1000 mg the other days of the week by mouth  . levothyroxine (SYNTHROID, LEVOTHROID) 100 MCG tablet TAKE 1 TABLET ONCE DAILY BEFORE BREAKFAST.  . Magnesium 250 MG TABS Take 250 mg by mouth daily.   . Melatonin 10 MG TABS Take 10 mg by mouth at bedtime.  . Multiple Vitamins-Minerals (PRESERVISION AREDS 2) CAPS Take 1 capsule by mouth 2 (two) times daily.   Melynda Ripple Tosylate (SYMPROIC) 0.2 MG TABS Take 1 tablet by mouth daily.  . naloxegol oxalate (MOVANTIK) 25 MG TABS tablet Take 1 tablet (25 mg total) by mouth daily.  . potassium chloride (K-DUR) 10 MEQ tablet TAKE 1 TABLET BY MOUTH TWICE DAILY.  [name] Kitchen senna-docusate (SENOKOT-S) 8.6-50 MG tablet Take 2 tablets by mouth 2 (two) times daily.  . sodium chloride (OCEAN) 0.65 % SOLN nasal spray Place 2 sprays into both nostrils as needed for congestion.  . traMADol (ULTRAM) 50 MG tablet Take 1 tablet (50 mg total) by mouth at bedtime.  . Vitamin D, Ergocalciferol, (DRISDOL) 1.25 MG (50000 UT) CAPS capsule Take 1 capsule (50,000 Units total) by mouth every 7 (seven) days.  . [DISCONTINUED] fentaNYL (DURAGESIC) 25 MCG/HR Apply one patient onto skin every 3 (three) days for pain  . amoxicillin-clavulanate  (AUGMENTIN) 875-125 MG tablet Take 1 tablet by mouth 2 (two) times daily.  . [DISCONTINUED] azithromycin (ZITHROMAX) 250 MG tablet 2 tablets today, then one daily for 4 days   No facility-administered encounter medications on file as of 06/07/2018.     Review of Systems:  Review of Systems  Constitutional: Negative for chills, fever and malaise/fatigue.  HENT: Positive for ear pain and hearing loss.   Eyes: Positive for blurred vision.  Respiratory: Negative for cough and shortness of breath.   Cardiovascular: Negative for chest pain, palpitations and leg swelling.  Gastrointestinal: Positive for constipation. Negative for abdominal pain, blood in stool, diarrhea and melena.  Genitourinary: Negative for dysuria.  Musculoskeletal: Positive for back pain.  Negative for falls.  Skin: Negative for itching and rash.  Neurological: Negative for dizziness and loss of consciousness.  Endo/Heme/Allergies: Bruises/bleeds easily.  Psychiatric/Behavioral: Negative for depression and memory loss. The patient is not nervous/anxious and does not have insomnia.     Health Maintenance  Topic Date Due  . INFLUENZA VACCINE  09/09/2018  . TETANUS/TDAP  01/13/2027  . DEXA SCAN  Completed  . PNA vac Low Risk Adult  Completed    Physical Exam: Vitals:   06/07/18 1356  BP: 100/60  Pulse: 68  Temp: 98.6 F (37 C)  TempSrc: Oral  SpO2: 95%  Weight: 107 lb (48.5 kg)  Height: 5' (1.524 m)   Body mass index is 20.9 kg/m. Physical Exam Vitals signs reviewed.  Constitutional:      Appearance: Normal appearance.  HENT:     Head: Normocephalic and atraumatic.     Right Ear: External ear normal. There is no impacted cerumen.     Left Ear: External ear normal. There is no impacted cerumen.     Ears:     Comments: Left ear with visible fluid behind ear drum with bubbles Pulmonary:     Effort: Pulmonary effort is normal.  Musculoskeletal: Normal range of motion.     Comments: Walks with rolling  walker with skis  Skin:    General: Skin is warm and dry.  Neurological:     General: No focal deficit present.     Mental Status: She is alert and oriented to person, place, and time.     Labs reviewed: Basic Metabolic Panel: Recent Labs    07/28/17  10/18/17 1621 12/22/17 1446 01/20/18 1545 01/27/18 1140 03/24/18 1410  NA 141   < > 140 137 138  --   --   K 5.0   < > 3.5 3.7 3.6  --   --   CL  --   --  100 102 99  --   --   CO2  --   --  31 26 26   --   --   GLUCOSE  --   --  64* 120* 99  --   --   BUN 38*   < > 18 22 26*  --   --   CREATININE 0.7   < > 0.65 0.95 0.90  --   --   CALCIUM  --   --  11.0* 10.9* 11.2* 11.8* 10.9*  TSH 1.37  --   --   --   --   --   --    < > = values in this interval not displayed.   Liver Function Tests: Recent Labs    07/26/17 0954 07/28/17 0800  AST 24 23  ALT 30 30  ALKPHOS 47 62  BILITOT 0.5  --   PROT 5.5*  --   ALBUMIN 3.8  --    No results for input(s): LIPASE, AMYLASE in the last 8760 hours. No results for input(s): AMMONIA in the last 8760 hours. CBC: Recent Labs    07/29/17 1305  11/17/17 1327 01/20/18 1545 04/14/18 1205  WBC 13.2*   < > 7.7 7.6 7.1  NEUTROABS 11.0*  --  5.5  --  5.1  HGB 12.4   < > 12.2 12.0 11.6*  HCT 38.2   < > 37.2 37.7 35.9*  MCV 113.4*   < > 111.7* 114.6* 114.3*  PLT 543*   < > 458* 460* 410*   < > = values in this  interval not displayed.   Lipid Panel: Recent Labs    07/28/17  CHOL 194  HDL 72*  LDLCALC 102  TRIG 99   No results found for: HGBA1C  Procedures since last visit: No results found.  Assessment/Plan 1. Left otitis media with effusion -reports she has continued with problems with her ears lately - effusion noted today and has had some discomfort in the ear as well as over her right eye suggestive of residual sinus infection so will treat once more with oral abx -advised yogurt with antibiotics - amoxicillin-clavulanate (AUGMENTIN) 875-125 MG tablet; Take 1 tablet by  mouth 2 (two) times daily.  Dispense: 20 tablet; Refill: 0  2. Lumbar back pain - stable, cont chronic opioid therapy as other conservative therapies have not been helpful -was also getting injections for her back - did not c/o this today - fentaNYL (DURAGESIC) 25 MCG/HR; Apply one patient onto skin every 3 (three) days for pain  Dispense: 10 patch; Refill: 0  3. Age-related osteoporosis with current pathological fracture, sequela -cont prolia injections ongoing and vitamin D3; also regular weightbearing exercise  4. Constipation due to pain medication -currently med list has both movantik and symproic on it--not clear what she is using here for her bowels--tries to eat fiber, prunes, hydrate, and walk  5. Chronic diastolic CHF (congestive heart failure) (HCC) -stable, no signs of acute exacerbation, cont same meds  Labs/tests ordered:  No new Next appt:  08/22/2018  Rashawnda Gaba L. Matrice Herro, D.O. [facility] [address], [place] [address] Cell Phone (Mon-Fri 8am-5pm):  [phone] On Call:  [phone] & follow prompts after 5pm & weekends Office Phone:  [phone] Office Fax:  [phone]

## 2018-06-12 ENCOUNTER — Telehealth: Payer: Self-pay | Admitting: *Deleted

## 2018-06-12 MED ORDER — DOXYCYCLINE HYCLATE 100 MG PO CAPS: 100.0000 mg | ORAL_CAPSULE | Freq: Two times a day (BID) | ORAL | 0 refills | Status: DC

## 2018-06-12 NOTE — Telephone Encounter (Signed)
Ok.  Let's stop the augmentin and begin doxycycline 100mg  po bid for just 7 days since she took three days of augmentin.  She should avoid the sun while on the doxycycline.

## 2018-06-12 NOTE — Telephone Encounter (Signed)
Patient notified and agreed. Medication list updated. Rx faxed to [facility] per patient's request

## 2018-06-12 NOTE — Telephone Encounter (Signed)
Patient called and stated that the Amoxicillin caused bad stomach pain/cramps. No diarrhea. Stated that she took 3 pills and stopped because of the back stomach cramps. No other symptoms noted.  Stated that her ear still hurts and thinks she needs to be on something but can't take this. Please Advise.

## 2018-06-22 ENCOUNTER — Other Ambulatory Visit: Payer: Self-pay | Admitting: Internal Medicine

## 2018-06-22 NOTE — Telephone Encounter (Signed)
Last filled 05/22/2018   [facility] Database verified and compliance confirmed

## 2018-06-28 ENCOUNTER — Telehealth (HOSPITAL_COMMUNITY): Payer: Self-pay | Admitting: Cardiology

## 2018-06-28 ENCOUNTER — Other Ambulatory Visit: Payer: Self-pay | Admitting: Internal Medicine

## 2018-06-28 NOTE — Telephone Encounter (Signed)
Pt aware and voiced understanding 

## 2018-06-28 NOTE — Telephone Encounter (Signed)
Increase lasix to 80 mg twice a day for 2 days then back to 60 mg twice a day.       Adalberto Metzgar NP-C  3:16 PM

## 2018-06-28 NOTE — Telephone Encounter (Signed)
Patient called with concerns of increased LE edema patient reports it was very difficult to put on shoes this AM. Denies increase weight or SOB  Reports this has happened in the past and lasix was increased x 3-4 days and it took care of swelling  Advised to keep feet elevated,drink <2L daily and be sure to follow a  Low salt diet  Please advise further

## 2018-06-29 DIAGNOSIS — H353222 Exudative age-related macular degeneration, left eye, with inactive choroidal neovascularization: Secondary | ICD-10-CM | POA: Diagnosis not present

## 2018-06-29 DIAGNOSIS — H4051X3 Glaucoma secondary to other eye disorders, right eye, severe stage: Secondary | ICD-10-CM | POA: Diagnosis not present

## 2018-06-29 DIAGNOSIS — H353211 Exudative age-related macular degeneration, right eye, with active choroidal neovascularization: Secondary | ICD-10-CM | POA: Diagnosis not present

## 2018-07-06 ENCOUNTER — Telehealth (HOSPITAL_COMMUNITY): Payer: Self-pay | Admitting: *Deleted

## 2018-07-06 NOTE — Telephone Encounter (Signed)
Called pt back to advise and schedule appointments. No answer left VM.

## 2018-07-06 NOTE — Telephone Encounter (Signed)
Can increase Lasix to 80 mg bid and leave it at that dose.  Needs BMET 5 days. Needs office appt.

## 2018-07-06 NOTE — Telephone Encounter (Signed)
Pt left VM stating both of her feet are swollen and she is retaining fluid. Patient stated when her meds were increased last week swelling went down (see below for message from last week)  but has returned the last couple of days.  Message routed to [place] for advice  06/28/2018  Garlan Fair, [place]-  "Patient called with concerns of increased LE edema patient reports it was very difficult to put on shoes this AM. Denies increase weight or SOB  Reports this has happened in the past and lasix was increased x 3-4 days and it took care of swelling  Advised to keep feet elevated,drink <2L daily and be sure to follow a  Low salt diet  Please advise further"   Amy Clegg,NP-" Increase lasix to 80 mg twice a day for 2 days then back to 60 mg twice a day."

## 2018-07-07 ENCOUNTER — Telehealth (HOSPITAL_COMMUNITY): Payer: Self-pay | Admitting: Cardiology

## 2018-07-07 ENCOUNTER — Other Ambulatory Visit: Payer: Self-pay

## 2018-07-07 ENCOUNTER — Encounter (HOSPITAL_COMMUNITY): Payer: Self-pay | Admitting: Cardiology

## 2018-07-07 ENCOUNTER — Ambulatory Visit (HOSPITAL_COMMUNITY)
Admission: RE | Admit: 2018-07-07 | Discharge: 2018-07-07 | Disposition: A | Discharge status: Home / Self Care | Payer: Medicare Other | Source: Ambulatory Visit | Attending: Cardiology | Admitting: Cardiology

## 2018-07-07 ENCOUNTER — Other Ambulatory Visit: Payer: Self-pay | Admitting: *Deleted

## 2018-07-07 DIAGNOSIS — M545 Low back pain, unspecified: Secondary | ICD-10-CM

## 2018-07-07 DIAGNOSIS — I480 Paroxysmal atrial fibrillation: Secondary | ICD-10-CM

## 2018-07-07 DIAGNOSIS — I5032 Chronic diastolic (congestive) heart failure: Secondary | ICD-10-CM

## 2018-07-07 MED ORDER — FUROSEMIDE 80 MG PO TABS: 80.0000 mg | ORAL_TABLET | Freq: Two times a day (BID) | ORAL | 6 refills | Status: DC

## 2018-07-07 MED ORDER — FENTANYL 25 MCG/HR TD PT72: MEDICATED_PATCH | TRANSDERMAL | 0 refills | Status: DC

## 2018-07-07 MED ORDER — POTASSIUM CHLORIDE ER 10 MEQ PO TBCR: EXTENDED_RELEASE_TABLET | ORAL | 1 refills | Status: DC

## 2018-07-07 NOTE — Progress Notes (Signed)
Heart Failure TeleHealth Note  Due to national recommendations of social distancing due to [place] 19, Audio/video telehealth visit is felt to be most appropriate for this patient at this time.  See MyChart message from today for patient consent regarding telehealth for [facility].  Date:  07/07/2018   ID:  Sarah Mays, DOB 1930/12/20, MRN [id number]  Location: Home  Provider location: [facility] Type of Visit: Established patient   PCP:  Gayland Curry, DO  Cardiologist:  Loralie Champagne, MD  Chief Complaint: Foot swelling   History of Present Illness: Sarah Mays is a 85 y.o. female who presents via audio/video conferencing for a telehealth visit today.     she denies symptoms worrisome for COVID 19.   Patient has history of paroxysmal atrial fibrillation and chronic diastolic CHF.  In 7/16, she was admitted with chest pain.  Cardiolite showed no ischemia or infarction.  Echo in 6/17 showed EF 60-65%.  She developed problems elevated HR when in atrial fibrillation but bradycardia when in NSR.  She underwent atrial fibrillation ablation in 11/17.  She is tolerating Xarelto without melena or BRBPR.  Dr. Rayann Heman had her stop amiodarone.    Echo in 11/18 showed EF 60-65%, PASP 47 mmHg.   TEE-guided DCCV back to NSR in 6/19.  TEE showed EF 55-60% with moderate-severe MR.   She developed multiple episodes of epistaxis with Xarelto use.  We have transitioned her to apixaban 2.5 mg bid and she has not had epistaxis since then.   Recently, she has been noting significantly increased peripheral edema, especially the swelling in her left foot. Her home health nurse says that the left foot has 2+ edema and the right foot trace edema.  She says that her breathing is fairly stable, no dyspnea walking around her house though she has not been very active recently with coronavirus.  No orthopnea/PND.  She does not feel palpitations but her pulse registers as  irregular on her monitor, rate is in the 90s.  We had her increase Lasix to 80 mg bid 2 days ago, she says that this has not made much difference yet in her swelling.   Labs (7/16): K 4, creatinine 0.94, calcium 10.8, LFTs normal Labs (9/16): K 4, creatinine 0.9, LDL 69, TSH normal, HCT 42.3 Labs (5/17): K 4.6, creatinine 0.94, HCT 41.5, LFTs normal. Labs (6/17): K 5, creatinine 1.04, LDL 42, HDL 101, TSH normal Labs (12/17): K 3.7, creatinine 0.76, hgb 11.2 Labs (4/18): K 4.6, creatinine 0.91 Labs (10/18): K 4.5, creatinine 0.62 Labs (12/18): hgb 13.5 Labs (1/19): K 4.3, creatinine 0.6 Labs (4/19): hgb 13.5 Labs (7/19): K 4.5, creatinine 0.5, hgb 12.1 Labs (11/19): K 3.7, creatinine 0.95 Labs (12/19): K 3.6, creatinine 0.9, hgb 11.6  PMH: 1. Atrial fibrillation: Diagnosed initially in 10/13.  Holter monitor in 11/13 showed atrial fibrillation with average rate 65.  - Atrial fibrillation ablation in 11/17.  - TEE-guided DCCV in 6/19.  2. Chronic diastolic CHF: Echo (62/95) with EF 65-70%, mild MR, moderate biatrial enlargement, moderate TR, PA systolic pressure 45 mmHg.   Echo (10/15): EF 60-65%.  Echo (7/16) with EF 60-65%, mild MR.  - Echo (6/17): EF 60-65%, PASP 41 mmHg.  - Echo (11/18): EF 60-65%, PASP 47 mmHg - TEE (6/19): EF 55-60%, mild LVH, mildly decreased RV systolic function, peak RV-RA gradient 40 mmHg, moderate-severe MR with bileaflet MVP, ERO 0.37 cm^2.  3. ETT-Sestamibi (11/13): Exercised to stage II,  small partially reversible anteroapical perfusion defect suggesting mild ischemia versus attenuation, EF 80%.  Cardiolite (7/16) with EF 64%, no ischemia or infarction.  4. HTN: Lower extremity swelling with amlodipine.  5. Polycythemia vera: Has had phlebotomy only once.  6. Hypothyroidism 7. Macular degeneration 8. TAH 1980 9. Familial hypocalciuric hypercalcemia 10. PFTs (amiodarone use) in 1/14 showed a mild obstructive defect.  11. Degenerative disc disease 12.  Sick sinus syndrome: Has not required PPM.  13. Mitral regurgitation: TEE (6/19) with moderate-severe MR with bileaflet MVP, ERO 0.37 cm^2.   Current Outpatient Medications  Medication Sig Dispense Refill  . acetaminophen (TYLENOL) 325 MG tablet Take 2 tablets (650 mg total) by mouth every 4 (four) hours as needed for mild pain or moderate pain. Not to exceed 3g in 24h 120 tablet 0  . Cholecalciferol (VITAMIN D) 2000 units CAPS Take 2,000 Units by mouth daily.     [name] Kitchen doxycycline (VIBRAMYCIN) 100 MG capsule Take 1 capsule (100 mg total) by mouth 2 (two) times daily. 14 capsule 0  . ELIQUIS 2.5 MG TABS tablet TAKE 1 TABLET BY MOUTH TWICE DAILY. 180 tablet 0  . fentaNYL (DURAGESIC) 25 MCG/HR Apply one patient onto skin every 3 (three) days for pain 10 patch 0  . furosemide (LASIX) 80 MG tablet Take 1 tablet (80 mg total) by mouth 2 (two) times daily. 60 tablet 6  . gabapentin (NEURONTIN) 100 MG capsule TAKE THREE (3) CAPSULES AT BEDTIME. 90 capsule 0  . hydrALAZINE (APRESOLINE) 50 MG tablet TAKE 1 1/2 TABLETS THREE TIMES DAILY. 135 tablet 0  . hydroxyurea (HYDREA) 500 MG capsule 500 mg on Mondays, Wednesdays and Fridays and 1000 mg the other days of the week by mouth 100 capsule 9  . levothyroxine (SYNTHROID, LEVOTHROID) 100 MCG tablet TAKE 1 TABLET ONCE DAILY BEFORE BREAKFAST. 90 tablet 0  . Magnesium 250 MG TABS Take 250 mg by mouth daily.     . Melatonin 10 MG TABS Take 10 mg by mouth at bedtime.    . Multiple Vitamins-Minerals (PRESERVISION AREDS 2) CAPS Take 1 capsule by mouth 2 (two) times daily.     Melynda Ripple Tosylate (SYMPROIC) 0.2 MG TABS Take 1 tablet by mouth daily. 7 tablet 0  . naloxegol oxalate (MOVANTIK) 25 MG TABS tablet Take 1 tablet (25 mg total) by mouth daily. 30 tablet 3  . potassium chloride (K-DUR) 10 MEQ tablet Take 2 tablets (20 mEq total) by mouth every morning AND 1 tablet (10 mEq total) every evening. 90 tablet 1  . senna-docusate (SENOKOT-S) 8.6-50 MG tablet Take 2  tablets by mouth 2 (two) times daily.    . sodium chloride (OCEAN) 0.65 % SOLN nasal spray Place 2 sprays into both nostrils as needed for congestion.    . traMADol (ULTRAM) 50 MG tablet TAKE ONE TABLET AT BEDTIME. 30 tablet 0  . Vitamin D, Ergocalciferol, (DRISDOL) 1.25 MG (50000 UT) CAPS capsule Take 1 capsule (50,000 Units total) by mouth every 7 (seven) days. 12 capsule 0   No current facility-administered medications for this encounter.     Allergies:   Amlodipine and Augmentin [amoxicillin-pot clavulanate]   Social History:  The patient  reports that she quit smoking about 34 years ago. Her smoking use included cigarettes. She has never used smokeless tobacco. She reports current alcohol use of about 1.0 standard drinks of alcohol per week. She reports that she does not use drugs.   Family History:  The patient's family history includes Breast cancer in  her daughter; Cancer in her daughter, sister, sister, and sister; Heart disease in her father and sister; Hypertension in her father; Ovarian cancer (age of onset: 48) in her mother.   ROS:  Please see the history of present illness.   All other systems are personally reviewed and negative.   Exam:  (Video/Tele Health Call; Exam is subjective and or/visual.) BP 110/70, HR 90s irregular, oxygen saturation 93% room air General:  Speaks in full sentences. No resp difficulty. Lungs: Normal respiratory effort with conversation.  Abdomen: Non-distended per patient report Extremities: Pt denies edema. Neuro: Alert & oriented x 3.   Recent Labs: 07/28/2017: ALT 30; TSH 1.37 01/20/2018: BUN 26; Creatinine, Ser 0.90; Potassium 3.6; Sodium 138 04/14/2018: Hemoglobin 11.6; Platelets 410  Personally reviewed   Wt Readings from Last 3 Encounters:  06/07/18 48.5 kg (107 lb)  04/14/18 48.1 kg (106 lb)  04/03/18 46.3 kg (102 lb)      ASSESSMENT AND PLAN:  1. Atrial fibrillation: First noted in 10/13.  Atrial fibrillation has triggered acute  on chronic diastolic CHF in the past.  CHADSVASC score is 46 (age, female gender, HTN, CHF).  She is anticoagulated with apixaban 2.5 mg bid which is appropriate for her age/weight.  She had atrial fibrillation ablation in 11/17.  She is now off amiodarone.  TEE-guided DCCV in 6/19.  Her pulse has been registering as irregular on her HR monitor recently and she has developed significant LE edema.  ?Worsening of diastolic CHF with recurrent of atrial fibrillation.     - She is not on nodal blockers due to h/o sick sinus syndrome.  - Continue apixaban 2.5 mg bid.  I will arrange for CBC.  - She will need to come in the office for an ECG.  If she is in atrial fibrillation, will consider DCCV.  2. Chronic diastolic CHF:  Significant new swelling in her feet, R>L.  Symptomatically, she seems about the same, NYHA class II.  She may be back in atrial fibrillation, which could have triggered worsening of diastolic CHF.  Would also consider worsening of mitral regurgitation triggering both atrial fibrillation and CHF.  - Continue higher dose of Lasix, 80 mg bid. She will need BMET next week.    - Increase KCl to 20 qam/10 qpm.  - She will need echo next week to assess for worsening of mitral regurgitation.  - She is anticoagulated so unlikely, but she apparently has markedly asymmetric swelling of her R>L foot, so will arrange for venous dopplers to rule out DVT.   3. HTN: BP reasonably controlled, continue current meds.     4. Bradycardia: Suspect she has a degree of sick sinus syndrome. This has been stable off nodal blocking agents.  5. Mitral regurgitation: Moderate to severe MR in setting of bileaflet prolapse on TEE in 6/19.  - With suspected worsening heart failure, I will obtain repeat echo next week.   COVID screen The patient does not have any symptoms that suggest any further testing/ screening at this time.  Social distancing reinforced today.  Patient Risk: After full review of this patients  clinical status, I feel that they are at moderate risk for cardiac decompensation at this time.  Relevant cardiac medications were reviewed at length with the patient today. The patient does not have concerns regarding their medications at this time.   Recommended follow-up:  7-10 days in office.   Today, I have spent 21 minutes with the patient with telehealth technology discussing the  above issues .    Signed, Loralie Champagne, MD  07/07/2018   [facility] [address] [facility] [place] [address] [phone] (office) [phone] (fax)

## 2018-07-07 NOTE — Telephone Encounter (Signed)
1. Needs Echo for mitral regurgitation.  2. Needs lower extremity venous dopplers to look for DVT on left.  3. Keep Lasix at 80 mg bid.  4. Increase KCl to 20 qam/10 qpm.  5. Needs ECG.   Would arrange for echo, labs, venous doppler, and ECG all on the same day early next week. If she could see me later the same day, would be ideal. If not, see me in person the following week.     1. Order for echo placed 2. Order for vascular study placed 3. Medication updated 4. Mediation updated   - will attempt to have all procedures, labs ,and EKG done at the same time.  Return to office one week after procedures complete   [facility] for patient  My chart message sent

## 2018-07-07 NOTE — Patient Instructions (Addendum)
CONTINUE Lasix 80 mg, one tab twice daily  CHANGE Potassium to 20 meq in the and and 10 meq in the PM  Your physician has requested that you have a lower or upper extremity venous duplex. This test is an ultrasound of the veins in the legs or arms. It looks at venous blood flow that carries blood from the heart to the legs or arms. Allow one hour for a Lower Venous exam. Allow thirty minutes for an Upper Venous exam. There are no restrictions or special instructions.  Your physician has requested that you have an echocardiogram. Echocardiography is a painless test that uses sound waves to create images of your heart. It provides your doctor with information about the size and shape of your heart and how well your heart's chambers and valves are working. This procedure takes approximately one hour. There are no restrictions for this procedure.  Your physician wants you to have an EKG done at the time of your other procedures.   Labs needed in 1-2 weeks   Your physician recommends that you schedule a follow-up appointment in: 1 week after all procedures are complete

## 2018-07-07 NOTE — Telephone Encounter (Signed)
Patient requested refill NCCSRS Database Verified LR: 06/07/2018 Pended and sent to Dr. Mariea Clonts for approval.

## 2018-07-12 ENCOUNTER — Ambulatory Visit (HOSPITAL_COMMUNITY)
Admission: RE | Admit: 2018-07-12 | Discharge: 2018-07-12 | Disposition: A | Discharge status: Home / Self Care | Payer: Medicare Other | Source: Ambulatory Visit | Attending: Internal Medicine | Admitting: Internal Medicine

## 2018-07-12 ENCOUNTER — Other Ambulatory Visit: Payer: Self-pay

## 2018-07-12 ENCOUNTER — Ambulatory Visit (HOSPITAL_COMMUNITY)
Admission: RE | Admit: 2018-07-12 | Discharge: 2018-07-12 | Disposition: A | Discharge status: Home / Self Care | Payer: Medicare Other | Source: Ambulatory Visit | Attending: Cardiology | Admitting: Cardiology

## 2018-07-12 ENCOUNTER — Ambulatory Visit (HOSPITAL_BASED_OUTPATIENT_CLINIC_OR_DEPARTMENT_OTHER)
Admission: RE | Admit: 2018-07-12 | Discharge: 2018-07-12 | Disposition: A | Discharge status: Home / Self Care | Payer: Medicare Other | Source: Ambulatory Visit | Attending: Cardiology | Admitting: Cardiology

## 2018-07-12 DIAGNOSIS — I5032 Chronic diastolic (congestive) heart failure: Secondary | ICD-10-CM

## 2018-07-12 LAB — BASIC METABOLIC PANEL
Anion gap: 12 (ref 5–15)
BUN: 28 mg/dL — ABNORMAL HIGH (ref 8–23)
CO2: 29 mmol/L (ref 22–32)
Calcium: 10.8 mg/dL — ABNORMAL HIGH (ref 8.9–10.3)
Chloride: 100 mmol/L (ref 98–111)
Creatinine, Ser: 1.01 mg/dL — ABNORMAL HIGH (ref 0.44–1.00)
GFR calc Af Amer: 59 mL/min — ABNORMAL LOW (ref >60)
GFR calc non Af Amer: 51 mL/min — ABNORMAL LOW (ref >60)
Glucose, Bld: 65 mg/dL — ABNORMAL LOW (ref 70–99)
Potassium: 3.7 mmol/L (ref 3.5–5.1)
Sodium: 141 mmol/L (ref 135–145)

## 2018-07-12 NOTE — Progress Notes (Signed)
Lower extremity venous has been completed.   Preliminary results in CV Proc.   Abram Sander 07/12/2018 9:44 AM

## 2018-07-13 DIAGNOSIS — H9202 Otalgia, left ear: Secondary | ICD-10-CM | POA: Diagnosis not present

## 2018-07-13 DIAGNOSIS — H903 Sensorineural hearing loss, bilateral: Secondary | ICD-10-CM | POA: Diagnosis not present

## 2018-07-17 ENCOUNTER — Encounter (HOSPITAL_COMMUNITY): Payer: Self-pay | Admitting: Cardiology

## 2018-07-17 ENCOUNTER — Ambulatory Visit (HOSPITAL_COMMUNITY)
Admission: RE | Admit: 2018-07-17 | Discharge: 2018-07-17 | Disposition: A | Discharge status: Home / Self Care | Payer: Medicare Other | Source: Ambulatory Visit | Attending: Cardiology | Admitting: Cardiology

## 2018-07-17 ENCOUNTER — Encounter (HOSPITAL_COMMUNITY): Payer: Self-pay

## 2018-07-17 ENCOUNTER — Other Ambulatory Visit (HOSPITAL_COMMUNITY)
Admission: RE | Admit: 2018-07-17 | Discharge: 2018-07-17 | Disposition: A | Discharge status: Home / Self Care | Payer: Medicare Other | Source: Ambulatory Visit | Attending: Cardiology | Admitting: Cardiology

## 2018-07-17 ENCOUNTER — Other Ambulatory Visit: Payer: Self-pay

## 2018-07-17 VITALS — BP 121/80 | HR 114 | Wt 103.6 lb

## 2018-07-17 DIAGNOSIS — I495 Sick sinus syndrome: Secondary | ICD-10-CM | POA: Insufficient documentation

## 2018-07-17 DIAGNOSIS — I340 Nonrheumatic mitral (valve) insufficiency: Secondary | ICD-10-CM | POA: Diagnosis not present

## 2018-07-17 DIAGNOSIS — I5032 Chronic diastolic (congestive) heart failure: Secondary | ICD-10-CM | POA: Diagnosis not present

## 2018-07-17 DIAGNOSIS — Z79890 Hormone replacement therapy (postmenopausal): Secondary | ICD-10-CM | POA: Insufficient documentation

## 2018-07-17 DIAGNOSIS — Z87891 Personal history of nicotine dependence: Secondary | ICD-10-CM | POA: Diagnosis not present

## 2018-07-17 DIAGNOSIS — Z7901 Long term (current) use of anticoagulants: Secondary | ICD-10-CM | POA: Diagnosis not present

## 2018-07-17 DIAGNOSIS — I110 Hypertensive heart disease with heart failure: Secondary | ICD-10-CM | POA: Diagnosis not present

## 2018-07-17 DIAGNOSIS — Z8041 Family history of malignant neoplasm of ovary: Secondary | ICD-10-CM | POA: Insufficient documentation

## 2018-07-17 DIAGNOSIS — Z881 Allergy status to other antibiotic agents status: Secondary | ICD-10-CM | POA: Diagnosis not present

## 2018-07-17 DIAGNOSIS — Z803 Family history of malignant neoplasm of breast: Secondary | ICD-10-CM | POA: Diagnosis not present

## 2018-07-17 DIAGNOSIS — D45 Polycythemia vera: Secondary | ICD-10-CM | POA: Diagnosis not present

## 2018-07-17 DIAGNOSIS — E039 Hypothyroidism, unspecified: Secondary | ICD-10-CM | POA: Diagnosis not present

## 2018-07-17 DIAGNOSIS — I4819 Other persistent atrial fibrillation: Secondary | ICD-10-CM | POA: Diagnosis not present

## 2018-07-17 DIAGNOSIS — Z79899 Other long term (current) drug therapy: Secondary | ICD-10-CM | POA: Insufficient documentation

## 2018-07-17 DIAGNOSIS — I480 Paroxysmal atrial fibrillation: Secondary | ICD-10-CM | POA: Insufficient documentation

## 2018-07-17 DIAGNOSIS — Z8249 Family history of ischemic heart disease and other diseases of the circulatory system: Secondary | ICD-10-CM | POA: Diagnosis not present

## 2018-07-17 DIAGNOSIS — Z888 Allergy status to other drugs, medicaments and biological substances status: Secondary | ICD-10-CM | POA: Diagnosis not present

## 2018-07-17 DIAGNOSIS — Z1159 Encounter for screening for other viral diseases: Secondary | ICD-10-CM | POA: Insufficient documentation

## 2018-07-17 LAB — BASIC METABOLIC PANEL
Anion gap: 13 (ref 5–15)
BUN: 31 mg/dL — ABNORMAL HIGH (ref 8–23)
CO2: 26 mmol/L (ref 22–32)
Calcium: 11.6 mg/dL — ABNORMAL HIGH (ref 8.9–10.3)
Chloride: 101 mmol/L (ref 98–111)
Creatinine, Ser: 0.91 mg/dL (ref 0.44–1.00)
GFR calc Af Amer: >60 mL/min (ref >60)
GFR calc non Af Amer: 58 mL/min — ABNORMAL LOW (ref >60)
Glucose, Bld: 96 mg/dL (ref 70–99)
Potassium: 3.9 mmol/L (ref 3.5–5.1)
Sodium: 140 mmol/L (ref 135–145)

## 2018-07-17 MED ORDER — AMIODARONE HCL 200 MG PO TABS: ORAL_TABLET | ORAL | 3 refills | Status: DC

## 2018-07-17 NOTE — Progress Notes (Signed)
Date:  07/17/2018   ID:  [name], DOB [birth year], MRN [id number]  Location: Home  Provider location:  Advanced Heart Failure Type of Visit: Established patient   PCP:  Gayland Curry, DO  Cardiologist:  Loralie Champagne, MD   History of Present Illness: Sarah Mays is a 85 y.o. female with history of paroxysmal atrial fibrillation and chronic diastolic CHF who presents for followup of CHF and atrial fibrillation.  In 7/16, she was admitted with chest pain.  Cardiolite showed no ischemia or infarction.  Echo in 6/17 showed EF 60-65%.  She developed problems elevated HR when in atrial fibrillation but bradycardia when in NSR.  She underwent atrial fibrillation ablation in 11/17.  She is tolerating Xarelto without melena or BRBPR.  Dr. Rayann Heman had her stop amiodarone.    Echo in 11/18 showed EF 60-65%, PASP 47 mmHg.   TEE-guided DCCV back to NSR in 6/19.  TEE showed EF 55-60% with moderate-severe MR.   She developed multiple episodes of epistaxis with Xarelto use.  We have transitioned her to apixaban 2.5 mg bid and she has not had epistaxis since then.   Recently, she noted increased pedal edema and dyspnea.  I had her increase Lasix to 80 mg bid.  Weight has trended down and edema has resolved.  She is noted to be back in atrial fibrillation. Unsure for how long, she does not feel palpitations. She is still more short of breath than her baseline, dyspnea after walking for 5-10 minutes.  No orthopnea/PND. No chest pain. No lightheadedness.  I had her do lower extremity venous dopplers last week with asymmetric edema, no DVT noted. Echo in 6/20 showed EF 60-65%, normal RV, mild-moderate MR.   Labs (7/16): K 4, creatinine 0.94, calcium 10.8, LFTs normal Labs (9/16): K 4, creatinine 0.9, LDL 69, TSH normal, HCT 42.3 Labs (5/17): K 4.6, creatinine 0.94, HCT 41.5, LFTs normal. Labs (6/17): K 5, creatinine 1.04, LDL 42, HDL 101, TSH normal Labs (12/17): K 3.7,  creatinine 0.76, hgb 11.2 Labs (4/18): K 4.6, creatinine 0.91 Labs (10/18): K 4.5, creatinine 0.62 Labs (12/18): hgb 13.5 Labs (1/19): K 4.3, creatinine 0.6 Labs (4/19): hgb 13.5 Labs (7/19): K 4.5, creatinine 0.5, hgb 12.1 Labs (11/19): K 3.7, creatinine 0.95 Labs (12/19): K 3.6, creatinine 0.9, hgb 11.6 Labs (6/20): K 3.7, creatinine 1.01  ECG (personally reviewed): Atrial fibrillation, rate 104 (personally reviewed)  PMH: 1. Atrial fibrillation: Diagnosed initially in 10/13.  Holter monitor in 11/13 showed atrial fibrillation with average rate 65.  - Atrial fibrillation ablation in 11/17.  - TEE-guided DCCV in 6/19.  2. Chronic diastolic CHF: Echo (68/11) with EF 65-70%, mild MR, moderate biatrial enlargement, moderate TR, PA systolic pressure 45 mmHg.   Echo (10/15): EF 60-65%.  Echo (7/16) with EF 60-65%, mild MR.  - Echo (6/17): EF 60-65%, PASP 41 mmHg.  - Echo (11/18): EF 60-65%, PASP 47 mmHg - TEE (6/19): EF 55-60%, mild LVH, mildly decreased RV systolic function, peak RV-RA gradient 40 mmHg, moderate-severe MR with bileaflet MVP, ERO 0.37 cm^2.  - Echo (6/20): EF 60-65%, normal RV, mild-moderate MR, severe biatrial enlargement.  3. ETT-Sestamibi (11/13): Exercised to stage II, small partially reversible anteroapical perfusion defect suggesting mild ischemia versus attenuation, EF 80%.  Cardiolite (7/16) with EF 64%, no ischemia or infarction.  4. HTN: Lower extremity swelling with amlodipine.  5. Polycythemia vera: Has had phlebotomy only once.  6. Hypothyroidism 7. Macular degeneration 8. TAH  1980 9. Familial hypocalciuric hypercalcemia 10. PFTs (amiodarone use) in 1/14 showed a mild obstructive defect.  11. Degenerative disc disease 12. Sick sinus syndrome: Has not required PPM.  13. Mitral regurgitation: TEE (6/19) with moderate-severe MR with bileaflet MVP, ERO 0.37 cm^2.  - Echo (6/20) with mild-moderate MR.   Current Outpatient Medications  Medication Sig Dispense  Refill  . acetaminophen (TYLENOL) 325 MG tablet Take 2 tablets (650 mg total) by mouth every 4 (four) hours as needed for mild pain or moderate pain. Not to exceed 3g in 24h 120 tablet 0  . Cholecalciferol (VITAMIN D) 2000 units CAPS Take 2,000 Units by mouth daily.     [name] Kitchen doxycycline (VIBRAMYCIN) 100 MG capsule Take 1 capsule (100 mg total) by mouth 2 (two) times daily. 14 capsule 0  . ELIQUIS 2.5 MG TABS tablet TAKE 1 TABLET BY MOUTH TWICE DAILY. 180 tablet 0  . fentaNYL (DURAGESIC) 25 MCG/HR Apply one patient onto skin every 3 (three) days for pain 10 patch 0  . furosemide (LASIX) 80 MG tablet Take 1 tablet (80 mg total) by mouth 2 (two) times daily. 60 tablet 6  . gabapentin (NEURONTIN) 100 MG capsule TAKE THREE (3) CAPSULES AT BEDTIME. 90 capsule 0  . hydrALAZINE (APRESOLINE) 50 MG tablet TAKE 1 1/2 TABLETS THREE TIMES DAILY. 135 tablet 0  . hydroxyurea (HYDREA) 500 MG capsule 500 mg on Mondays, Wednesdays and Fridays and 1000 mg the other days of the week by mouth 100 capsule 9  . levothyroxine (SYNTHROID, LEVOTHROID) 100 MCG tablet TAKE 1 TABLET ONCE DAILY BEFORE BREAKFAST. 90 tablet 0  . Magnesium 250 MG TABS Take 250 mg by mouth daily.     . Melatonin 10 MG TABS Take 10 mg by mouth at bedtime.    . Multiple Vitamins-Minerals (PRESERVISION AREDS 2) CAPS Take 1 capsule by mouth 2 (two) times daily.     Melynda Ripple Tosylate (SYMPROIC) 0.2 MG TABS Take 1 tablet by mouth daily. 7 tablet 0  . potassium chloride (K-DUR) 10 MEQ tablet Take 2 tablets (20 mEq total) by mouth every morning AND 1 tablet (10 mEq total) every evening. 90 tablet 1  . senna-docusate (SENOKOT-S) 8.6-50 MG tablet Take 2 tablets by mouth 2 (two) times daily.    . sodium chloride (OCEAN) 0.65 % SOLN nasal spray Place 2 sprays into both nostrils as needed for congestion.    . traMADol (ULTRAM) 50 MG tablet TAKE ONE TABLET AT BEDTIME. 30 tablet 0  . Vitamin D, Ergocalciferol, (DRISDOL) 1.25 MG (50000 UT) CAPS capsule Take 1  capsule (50,000 Units total) by mouth every 7 (seven) days. 12 capsule 0  . amiodarone (PACERONE) 200 MG tablet Take 1 tablet (200 mg total) by mouth 2 (two) times daily for 14 days, THEN 1 tablet (200 mg total) daily. 90 tablet 3  . naloxegol oxalate (MOVANTIK) 25 MG TABS tablet Take 1 tablet (25 mg total) by mouth daily. (Patient not taking: Reported on 07/17/2018) 30 tablet 3   No current facility-administered medications for this encounter.     Allergies:   Amlodipine and Augmentin [amoxicillin-pot clavulanate]   Social History:  The patient  reports that she quit smoking about 34 years ago. Her smoking use included cigarettes. She has never used smokeless tobacco. She reports current alcohol use of about 1.0 standard drinks of alcohol per week. She reports that she does not use drugs.   Family History:  The patient's family history includes Breast cancer in her daughter; Cancer  in her daughter, sister, sister, and sister; Heart disease in her father and sister; Hypertension in her father; Ovarian cancer (age of onset: 67) in her mother.   ROS:  Please see the history of present illness.   All other systems are personally reviewed and negative.   Exam:   BP 121/80   Pulse (!) 114   Wt 47 kg (103 lb 9.6 oz)   SpO2 96%   BMI 20.23 kg/m   General: NAD Neck: JVP 8 cm with HJR, no thyromegaly or thyroid nodule.  Lungs: Clear to auscultation bilaterally with normal respiratory effort. CV: Nondisplaced PMI.  Heart irregular S1/S2, no S3/S4, no murmur.  No peripheral edema.  No carotid bruit.  Normal pedal pulses.  Abdomen: Soft, nontender, no hepatosplenomegaly, no distention.  Skin: Intact without lesions or rashes.  Neurologic: Alert and oriented x 3.  Psych: Normal affect. Extremities: No clubbing or cyanosis.  HEENT: Normal.    Recent Labs: 07/28/2017: ALT 30; TSH 1.37 04/14/2018: Hemoglobin 11.6; Platelets 410 07/17/2018: BUN 31; Creatinine, Ser 0.91; Potassium 3.9; Sodium 140   Personally reviewed   Wt Readings from Last 3 Encounters:  07/17/18 47 kg (103 lb 9.6 oz)  06/07/18 48.5 kg (107 lb)  04/14/18 48.1 kg (106 lb)      ASSESSMENT AND PLAN:  1. Atrial fibrillation: First noted in 10/13.  Atrial fibrillation has triggered acute on chronic diastolic CHF in the past.  CHADSVASC score is 51 (age, female gender, HTN, CHF).  She is anticoagulated with apixaban 2.5 mg bid which is appropriate for her age/weight.  She had atrial fibrillation ablation in 11/17.  She is now off amiodarone.  TEE-guided DCCV in 6/19.  She is now back in atrial fibrillation, not sure how long.  Think it probably triggered worsening of diastolic CHF and she had had increased edema for about a month.  With worsening HF, think we need to get her back into NSR.   - She is not on nodal blockers due to h/o sick sinus syndrome.  I am going to restart amiodarone at 200 mg bid x 2 wks then 200 mg daily.  Will have to watch closely for bradycardia when in NSR.  - Continue apixaban 2.5 mg bid.  - I will arrange for DCCV later this week.  We discussed risks/benefits and she agrees to procedure.   2. Chronic diastolic CHF:  Echo was done in 6/20 with EF 60-65%, normal RV, mild-moderate MR.  On exam, she is volume overloaded but she states that she has been improving since increasing Lasix to 80 mg bid.  Need to get her back into NSR . - Continue higher dose of Lasix, 80 mg bid. BMET today. .    - Continue KCl 20 qam/10 qpm.  3. HTN: BP reasonably controlled, continue current meds.     4. Bradycardia: Suspect she has a degree of sick sinus syndrome. This has been stable off nodal blocking agents.  Watch closely when she goes back into NSR as I have started amiodarone to try to maintain NSR.  5. Mitral regurgitation: Moderate to severe MR in setting of bileaflet prolapse on TEE in 6/19. However, echo in 6/20 showed only mild-moderate MR.   Recommended follow-up:  3-4 weeks.   Signed, Loralie Champagne, MD   07/17/2018   [place] [address] [facility] and [place] [address] [phone] (office) [phone] (fax)

## 2018-07-17 NOTE — H&P (View-Only) (Signed)
Date:  07/17/2018   ID:  Sarah Mays, DOB 03/29/1930, MRN [id number]  Location: Home  Provider location: [place] Advanced Heart Failure Type of Visit: Established patient   PCP:  Gayland Curry, DO  Cardiologist:  Loralie Champagne, MD   History of Present Illness: Sarah Mays is a 85 y.o. female with history of paroxysmal atrial fibrillation and chronic diastolic CHF who presents for followup of CHF and atrial fibrillation.  In 7/16, she was admitted with chest pain.  Cardiolite showed no ischemia or infarction.  Echo in 6/17 showed EF 60-65%.  She developed problems elevated HR when in atrial fibrillation but bradycardia when in NSR.  She underwent atrial fibrillation ablation in 11/17.  She is tolerating Xarelto without melena or BRBPR.  Dr. Rayann Heman had her stop amiodarone.    Echo in 11/18 showed EF 60-65%, PASP 47 mmHg.   TEE-guided DCCV back to NSR in 6/19.  TEE showed EF 55-60% with moderate-severe MR.   She developed multiple episodes of epistaxis with Xarelto use.  We have transitioned her to apixaban 2.5 mg bid and she has not had epistaxis since then.   Recently, she noted increased pedal edema and dyspnea.  I had her increase Lasix to 80 mg bid.  Weight has trended down and edema has resolved.  She is noted to be back in atrial fibrillation. Unsure for how long, she does not feel palpitations. She is still more short of breath than her baseline, dyspnea after walking for 5-10 minutes.  No orthopnea/PND. No chest pain. No lightheadedness.  I had her do lower extremity venous dopplers last week with asymmetric edema, no DVT noted. Echo in 6/20 showed EF 60-65%, normal RV, mild-moderate MR.   Labs (7/16): K 4, creatinine 0.94, calcium 10.8, LFTs normal Labs (9/16): K 4, creatinine 0.9, LDL 69, TSH normal, HCT 42.3 Labs (5/17): K 4.6, creatinine 0.94, HCT 41.5, LFTs normal. Labs (6/17): K 5, creatinine 1.04, LDL 42, HDL 101, TSH normal Labs (12/17): K 3.7,  creatinine 0.76, hgb 11.2 Labs (4/18): K 4.6, creatinine 0.91 Labs (10/18): K 4.5, creatinine 0.62 Labs (12/18): hgb 13.5 Labs (1/19): K 4.3, creatinine 0.6 Labs (4/19): hgb 13.5 Labs (7/19): K 4.5, creatinine 0.5, hgb 12.1 Labs (11/19): K 3.7, creatinine 0.95 Labs (12/19): K 3.6, creatinine 0.9, hgb 11.6 Labs (6/20): K 3.7, creatinine 1.01  ECG (personally reviewed): Atrial fibrillation, rate 104 (personally reviewed)  PMH: 1. Atrial fibrillation: Diagnosed initially in 10/13.  Holter monitor in 11/13 showed atrial fibrillation with average rate 65.  - Atrial fibrillation ablation in 11/17.  - TEE-guided DCCV in 6/19.  2. Chronic diastolic CHF: Echo (27/51) with EF 65-70%, mild MR, moderate biatrial enlargement, moderate TR, PA systolic pressure 45 mmHg.   Echo (10/15): EF 60-65%.  Echo (7/16) with EF 60-65%, mild MR.  - Echo (6/17): EF 60-65%, PASP 41 mmHg.  - Echo (11/18): EF 60-65%, PASP 47 mmHg - TEE (6/19): EF 55-60%, mild LVH, mildly decreased RV systolic function, peak RV-RA gradient 40 mmHg, moderate-severe MR with bileaflet MVP, ERO 0.37 cm^2.  - Echo (6/20): EF 60-65%, normal RV, mild-moderate MR, severe biatrial enlargement.  3. ETT-Sestamibi (11/13): Exercised to stage II, small partially reversible anteroapical perfusion defect suggesting mild ischemia versus attenuation, EF 80%.  Cardiolite (7/16) with EF 64%, no ischemia or infarction.  4. HTN: Lower extremity swelling with amlodipine.  5. Polycythemia vera: Has had phlebotomy only once.  6. Hypothyroidism 7. Macular degeneration 8. TAH  1980 9. Familial hypocalciuric hypercalcemia 10. PFTs (amiodarone use) in 1/14 showed a mild obstructive defect.  11. Degenerative disc disease 12. Sick sinus syndrome: Has not required PPM.  13. Mitral regurgitation: TEE (6/19) with moderate-severe MR with bileaflet MVP, ERO 0.37 cm^2.  - Echo (6/20) with mild-moderate MR.   Current Outpatient Medications  Medication Sig Dispense  Refill  . acetaminophen (TYLENOL) 325 MG tablet Take 2 tablets (650 mg total) by mouth every 4 (four) hours as needed for mild pain or moderate pain. Not to exceed 3g in 24h 120 tablet 0  . Cholecalciferol (VITAMIN D) 2000 units CAPS Take 2,000 Units by mouth daily.     [name] Kitchen doxycycline (VIBRAMYCIN) 100 MG capsule Take 1 capsule (100 mg total) by mouth 2 (two) times daily. 14 capsule 0  . ELIQUIS 2.5 MG TABS tablet TAKE 1 TABLET BY MOUTH TWICE DAILY. 180 tablet 0  . fentaNYL (DURAGESIC) 25 MCG/HR Apply one patient onto skin every 3 (three) days for pain 10 patch 0  . furosemide (LASIX) 80 MG tablet Take 1 tablet (80 mg total) by mouth 2 (two) times daily. 60 tablet 6  . gabapentin (NEURONTIN) 100 MG capsule TAKE THREE (3) CAPSULES AT BEDTIME. 90 capsule 0  . hydrALAZINE (APRESOLINE) 50 MG tablet TAKE 1 1/2 TABLETS THREE TIMES DAILY. 135 tablet 0  . hydroxyurea (HYDREA) 500 MG capsule 500 mg on Mondays, Wednesdays and Fridays and 1000 mg the other days of the week by mouth 100 capsule 9  . levothyroxine (SYNTHROID, LEVOTHROID) 100 MCG tablet TAKE 1 TABLET ONCE DAILY BEFORE BREAKFAST. 90 tablet 0  . Magnesium 250 MG TABS Take 250 mg by mouth daily.     . Melatonin 10 MG TABS Take 10 mg by mouth at bedtime.    . Multiple Vitamins-Minerals (PRESERVISION AREDS 2) CAPS Take 1 capsule by mouth 2 (two) times daily.     Melynda Ripple Tosylate (SYMPROIC) 0.2 MG TABS Take 1 tablet by mouth daily. 7 tablet 0  . potassium chloride (K-DUR) 10 MEQ tablet Take 2 tablets (20 mEq total) by mouth every morning AND 1 tablet (10 mEq total) every evening. 90 tablet 1  . senna-docusate (SENOKOT-S) 8.6-50 MG tablet Take 2 tablets by mouth 2 (two) times daily.    . sodium chloride (OCEAN) 0.65 % SOLN nasal spray Place 2 sprays into both nostrils as needed for congestion.    . traMADol (ULTRAM) 50 MG tablet TAKE ONE TABLET AT BEDTIME. 30 tablet 0  . Vitamin D, Ergocalciferol, (DRISDOL) 1.25 MG (50000 UT) CAPS capsule Take 1  capsule (50,000 Units total) by mouth every 7 (seven) days. 12 capsule 0  . amiodarone (PACERONE) 200 MG tablet Take 1 tablet (200 mg total) by mouth 2 (two) times daily for 14 days, THEN 1 tablet (200 mg total) daily. 90 tablet 3  . naloxegol oxalate (MOVANTIK) 25 MG TABS tablet Take 1 tablet (25 mg total) by mouth daily. (Patient not taking: Reported on 07/17/2018) 30 tablet 3   No current facility-administered medications for this encounter.     Allergies:   Amlodipine and Augmentin [amoxicillin-pot clavulanate]   Social History:  The patient  reports that she quit smoking about 34 years ago. Her smoking use included cigarettes. She has never used smokeless tobacco. She reports current alcohol use of about 1.0 standard drinks of alcohol per week. She reports that she does not use drugs.   Family History:  The patient's family history includes Breast cancer in her daughter; Cancer  in her daughter, sister, sister, and sister; Heart disease in her father and sister; Hypertension in her father; Ovarian cancer (age of onset: 41) in her mother.   ROS:  Please see the history of present illness.   All other systems are personally reviewed and negative.   Exam:   BP 121/80   Pulse (!) 114   Wt 47 kg (103 lb 9.6 oz)   SpO2 96%   BMI 20.23 kg/m   General: NAD Neck: JVP 8 cm with HJR, no thyromegaly or thyroid nodule.  Lungs: Clear to auscultation bilaterally with normal respiratory effort. CV: Nondisplaced PMI.  Heart irregular S1/S2, no S3/S4, no murmur.  No peripheral edema.  No carotid bruit.  Normal pedal pulses.  Abdomen: Soft, nontender, no hepatosplenomegaly, no distention.  Skin: Intact without lesions or rashes.  Neurologic: Alert and oriented x 3.  Psych: Normal affect. Extremities: No clubbing or cyanosis.  HEENT: Normal.    Recent Labs: 07/28/2017: ALT 30; TSH 1.37 04/14/2018: Hemoglobin 11.6; Platelets 410 07/17/2018: BUN 31; Creatinine, Ser 0.91; Potassium 3.9; Sodium 140   Personally reviewed   Wt Readings from Last 3 Encounters:  07/17/18 47 kg (103 lb 9.6 oz)  06/07/18 48.5 kg (107 lb)  04/14/18 48.1 kg (106 lb)      ASSESSMENT AND PLAN:  1. Atrial fibrillation: First noted in 10/13.  Atrial fibrillation has triggered acute on chronic diastolic CHF in the past.  CHADSVASC score is 55 (age, female gender, HTN, CHF).  She is anticoagulated with apixaban 2.5 mg bid which is appropriate for her age/weight.  She had atrial fibrillation ablation in 11/17.  She is now off amiodarone.  TEE-guided DCCV in 6/19.  She is now back in atrial fibrillation, not sure how long.  Think it probably triggered worsening of diastolic CHF and she had had increased edema for about a month.  With worsening HF, think we need to get her back into NSR.   - She is not on nodal blockers due to h/o sick sinus syndrome.  I am going to restart amiodarone at 200 mg bid x 2 wks then 200 mg daily.  Will have to watch closely for bradycardia when in NSR.  - Continue apixaban 2.5 mg bid.  - I will arrange for DCCV later this week.  We discussed risks/benefits and she agrees to procedure.   2. Chronic diastolic CHF:  Echo was done in 6/20 with EF 60-65%, normal RV, mild-moderate MR.  On exam, she is volume overloaded but she states that she has been improving since increasing Lasix to 80 mg bid.  Need to get her back into NSR . - Continue higher dose of Lasix, 80 mg bid. BMET today. .    - Continue KCl 20 qam/10 qpm.  3. HTN: BP reasonably controlled, continue current meds.     4. Bradycardia: Suspect she has a degree of sick sinus syndrome. This has been stable off nodal blocking agents.  Watch closely when she goes back into NSR as I have started amiodarone to try to maintain NSR.  5. Mitral regurgitation: Moderate to severe MR in setting of bileaflet prolapse on TEE in 6/19. However, echo in 6/20 showed only mild-moderate MR.   Recommended follow-up:  3-4 weeks.   Signed, Loralie Champagne, MD   07/17/2018   [place] [address] [facility] and [place] [address] [phone] (office) [phone] (fax)

## 2018-07-17 NOTE — Patient Instructions (Signed)
Labs were done today. We will call you with any ABNORMAL results. No news is good news!  BEGIN taking Amiodarone 200 mg tablet twice a day for 14 days.  After 14 days, take Amiodarone 200 mg tablet once a day.

## 2018-07-18 ENCOUNTER — Telehealth (HOSPITAL_COMMUNITY): Payer: Self-pay | Admitting: *Deleted

## 2018-07-18 ENCOUNTER — Telehealth: Payer: Self-pay | Admitting: Cardiology

## 2018-07-18 NOTE — Telephone Encounter (Signed)
Pt's daughter left a VM asking about reason for Covid testing and if she had to self quarantine.  Attempted to call back and left detailed mess that Covid test is being done b/c pt is sch for dccv and once she has the covid test she is supposed to self quarantine at home until she comes to the hospital for her procedure.  Advised if she had further questions to call us back.

## 2018-07-18 NOTE — Telephone Encounter (Signed)
Patient called and had questions about her upcoming procedure with Dr. Aundra Dubin.  1. How long will she be in quarantine 2. How long will the test take?  Please advise.

## 2018-07-18 NOTE — Telephone Encounter (Signed)
Hello  Yes, any patient coming into the hospital for a outpatient procedure is required to have a COVID screening done. Per the protocol patients will need to quarantine until they have completed the procedure in the hospital.  The test can take up to 48 to result.  Cardioversions are considered an outpatient procedure, She must arrive 2 before her scheduled procedure, the actual procedure can take between 45 minutes to an hour. And she will need to wait another 45 minutes to an hour post procedure before she can go home.   Hope all this helps, please feel free to reach back out if you have some more questions

## 2018-07-19 LAB — NOVEL CORONAVIRUS, NAA (HOSP ORDER, SEND-OUT TO REF LAB; TAT 18-24 HRS): SARS-CoV-2, NAA: NOT DETECTED

## 2018-07-20 ENCOUNTER — Other Ambulatory Visit (HOSPITAL_COMMUNITY): Payer: Self-pay

## 2018-07-20 ENCOUNTER — Other Ambulatory Visit: Payer: Self-pay | Admitting: Internal Medicine

## 2018-07-21 ENCOUNTER — Encounter (HOSPITAL_COMMUNITY): Payer: Self-pay

## 2018-07-21 ENCOUNTER — Encounter (HOSPITAL_COMMUNITY)
Admission: RE | Disposition: A | Discharge status: Home / Self Care | Payer: Self-pay | Source: Home / Self Care | Attending: Cardiology

## 2018-07-21 ENCOUNTER — Other Ambulatory Visit: Payer: Self-pay

## 2018-07-21 ENCOUNTER — Ambulatory Visit (HOSPITAL_COMMUNITY)
Admission: RE | Admit: 2018-07-21 | Discharge: 2018-07-21 | Disposition: A | Discharge status: Home / Self Care | Payer: Medicare Other | Attending: Cardiology | Admitting: Cardiology

## 2018-07-21 ENCOUNTER — Ambulatory Visit (HOSPITAL_COMMUNITY): Payer: Medicare Other | Admitting: Certified Registered Nurse Anesthetist

## 2018-07-21 DIAGNOSIS — I110 Hypertensive heart disease with heart failure: Secondary | ICD-10-CM | POA: Diagnosis not present

## 2018-07-21 DIAGNOSIS — I495 Sick sinus syndrome: Secondary | ICD-10-CM | POA: Diagnosis not present

## 2018-07-21 DIAGNOSIS — Z880 Allergy status to penicillin: Secondary | ICD-10-CM | POA: Diagnosis not present

## 2018-07-21 DIAGNOSIS — I5033 Acute on chronic diastolic (congestive) heart failure: Secondary | ICD-10-CM | POA: Diagnosis not present

## 2018-07-21 DIAGNOSIS — I480 Paroxysmal atrial fibrillation: Secondary | ICD-10-CM | POA: Diagnosis not present

## 2018-07-21 DIAGNOSIS — Z79899 Other long term (current) drug therapy: Secondary | ICD-10-CM | POA: Insufficient documentation

## 2018-07-21 DIAGNOSIS — Z79890 Hormone replacement therapy (postmenopausal): Secondary | ICD-10-CM | POA: Insufficient documentation

## 2018-07-21 DIAGNOSIS — Z888 Allergy status to other drugs, medicaments and biological substances status: Secondary | ICD-10-CM | POA: Diagnosis not present

## 2018-07-21 DIAGNOSIS — Z7901 Long term (current) use of anticoagulants: Secondary | ICD-10-CM | POA: Insufficient documentation

## 2018-07-21 DIAGNOSIS — H3530 Unspecified macular degeneration: Secondary | ICD-10-CM | POA: Insufficient documentation

## 2018-07-21 DIAGNOSIS — E039 Hypothyroidism, unspecified: Secondary | ICD-10-CM | POA: Diagnosis not present

## 2018-07-21 DIAGNOSIS — Z87891 Personal history of nicotine dependence: Secondary | ICD-10-CM | POA: Insufficient documentation

## 2018-07-21 DIAGNOSIS — I340 Nonrheumatic mitral (valve) insufficiency: Secondary | ICD-10-CM | POA: Diagnosis not present

## 2018-07-21 DIAGNOSIS — D45 Polycythemia vera: Secondary | ICD-10-CM | POA: Insufficient documentation

## 2018-07-21 DIAGNOSIS — I5032 Chronic diastolic (congestive) heart failure: Secondary | ICD-10-CM | POA: Diagnosis not present

## 2018-07-21 DIAGNOSIS — Z881 Allergy status to other antibiotic agents status: Secondary | ICD-10-CM | POA: Diagnosis not present

## 2018-07-21 DIAGNOSIS — I4891 Unspecified atrial fibrillation: Secondary | ICD-10-CM | POA: Diagnosis not present

## 2018-07-21 HISTORY — PX: CARDIOVERSION: SHX1299

## 2018-07-21 SURGERY — CARDIOVERSION
Anesthesia: General

## 2018-07-21 MED ORDER — SODIUM CHLORIDE 0.9 % IV SOLN
INTRAVENOUS | Status: DC | PRN
  Administered 2018-07-21: 10:00:00 via INTRAVENOUS

## 2018-07-21 MED ORDER — LIDOCAINE 2% (20 MG/ML) 5 ML SYRINGE
INTRAMUSCULAR | Status: DC | PRN
  Administered 2018-07-21: 60 mg via INTRAVENOUS

## 2018-07-21 MED ORDER — PROPOFOL 10 MG/ML IV BOLUS
INTRAVENOUS | Status: DC | PRN
  Administered 2018-07-21: 50 mg via INTRAVENOUS

## 2018-07-21 NOTE — Anesthesia Postprocedure Evaluation (Signed)
Anesthesia Post Note  Patient: Sarah Mays  Procedure(s) Performed: CARDIOVERSION (N/A )     Patient location during evaluation: PACU Anesthesia Type: General Level of consciousness: awake and alert Pain management: pain level controlled Vital Signs Assessment: post-procedure vital signs reviewed and stable Respiratory status: spontaneous breathing, nonlabored ventilation, respiratory function stable and patient connected to nasal cannula oxygen Cardiovascular status: blood pressure returned to baseline and stable Postop Assessment: no apparent nausea or vomiting Anesthetic complications: no    Last Vitals:  Vitals:   07/21/18 0957 07/21/18 1129  BP: 136/82 (!) 101/57  Pulse: 98 68  Resp: 15 16  Temp: 37.1 C 37 C  SpO2: 96% 98%    Last Pain:  Vitals:   07/21/18 1129  TempSrc: Oral  PainSc: 0-No pain                 Taraoluwa Thakur S

## 2018-07-21 NOTE — Anesthesia Preprocedure Evaluation (Signed)
Anesthesia Evaluation  Patient identified by MRN, date of birth, ID band Patient awake    Reviewed: Allergy & Precautions, NPO status , Patient's Chart, lab work & pertinent test results  Airway Mallampati: II  TM Distance: >3 FB Neck ROM: Full    Dental no notable dental hx.    Pulmonary neg pulmonary ROS, former smoker,    Pulmonary exam normal breath sounds clear to auscultation       Cardiovascular hypertension, Normal cardiovascular exam+ dysrhythmias Atrial Fibrillation  Rhythm:Regular Rate:Normal     Neuro/Psych negative neurological ROS  negative psych ROS   GI/Hepatic negative GI ROS, Neg liver ROS,   Endo/Other  Hypothyroidism   Renal/GU negative Renal ROS  negative genitourinary   Musculoskeletal negative musculoskeletal ROS (+)   Abdominal   Peds negative pediatric ROS (+)  Hematology negative hematology ROS (+)   Anesthesia Other Findings   Reproductive/Obstetrics negative OB ROS                             Anesthesia Physical Anesthesia Plan  ASA: III  Anesthesia Plan: General   Post-op Pain Management:    Induction: Intravenous  PONV Risk Score and Plan: 0  Airway Management Planned: Mask  Additional Equipment:   Intra-op Plan:   Post-operative Plan:   Informed Consent: I have reviewed the patients History and Physical, chart, labs and discussed the procedure including the risks, benefits and alternatives for the proposed anesthesia with the patient or authorized representative who has indicated his/her understanding and acceptance.     Dental advisory given  Plan Discussed with: CRNA and Surgeon  Anesthesia Plan Comments:         Anesthesia Quick Evaluation

## 2018-07-21 NOTE — Transfer of Care (Signed)
Immediate Anesthesia Transfer of Care Note  Patient: Sarah Mays  Procedure(s) Performed: CARDIOVERSION (N/A )  Patient Location: PACU  Anesthesia Type:General  Level of Consciousness: awake and patient cooperative  Airway & Oxygen Therapy: Patient Spontanous Breathing and Patient connected to face mask oxygen  Post-op Assessment: Report given to RN and Post -op Vital signs reviewed and stable  Post vital signs: Reviewed and stable  Last Vitals:  Vitals Value Taken Time  BP    Temp    Pulse    Resp    SpO2      Last Pain:  Vitals:   07/21/18 0957  TempSrc: Temporal  PainSc: 0-No pain         Complications: No apparent anesthesia complications

## 2018-07-21 NOTE — Interval H&P Note (Signed)
History and Physical Interval Note:  07/21/2018 11:17 AM  Sarah Mays  has presented today for surgery, with the diagnosis of A-FIB.  The various methods of treatment have been discussed with the patient and family. After consideration of risks, benefits and other options for treatment, the patient has consented to  Procedure(s): CARDIOVERSION (N/A) as a surgical intervention.  The patient's history has been reviewed, patient examined, no change in status, stable for surgery.  I have reviewed the patient's chart and labs.  Questions were answered to the patient's satisfaction.     Dalton [employer]

## 2018-07-21 NOTE — Procedures (Signed)
Electrical Cardioversion Procedure Note Sarah Mays [id number] 09-09-1930  Procedure: Electrical Cardioversion Indications:  Atrial Fibrillation  Procedure Details Consent: Risks of procedure as well as the alternatives and risks of each were explained to the (patient/caregiver).  Consent for procedure obtained. Time Out: Verified patient identification, verified procedure, site/side was marked, verified correct patient position, special equipment/implants available, medications/allergies/relevent history reviewed, required imaging and test results available.  Performed  Patient placed on cardiac monitor, pulse oximetry, supplemental oxygen as necessary.  Sedation given: Propofol per anesthesiology Pacer pads placed anterior and posterior chest.  Cardioverted 2 times.  Cardioverted at 200J, the 2nd time with sternal pressure.  Evaluation Findings: Post procedure EKG shows: NSR Complications: None Patient did tolerate procedure well.   Loralie Champagne 07/21/2018, 11:23 AM

## 2018-07-21 NOTE — Anesthesia Procedure Notes (Signed)
Procedure Name: General with mask airway Date/Time: 07/21/2018 11:21 AM Performed by: Orlie Dakin, CRNA Pre-anesthesia Checklist: Patient identified, Emergency Drugs available, Suction available and Patient being monitored Patient Re-evaluated:Patient Re-evaluated prior to induction Oxygen Delivery Method: Ambu bag Preoxygenation: Pre-oxygenation with 100% oxygen Induction Type: IV induction

## 2018-07-21 NOTE — Discharge Instructions (Signed)
Electrical Cardioversion, Care After °This sheet gives you information about how to care for yourself after your procedure. Your health care provider may also give you more specific instructions. If you have problems or questions, contact your health care provider. °What can I expect after the procedure? °After the procedure, it is common to have: °· Some redness on the skin where the shocks were given. °Follow these instructions at home: ° °· Do not drive for 24 hours if you were given a medicine to help you relax (sedative). °· Take over-the-counter and prescription medicines only as told by your health care provider. °· Ask your health care provider how to check your pulse. Check it often. °· Rest for 48 hours after the procedure or as told by your health care provider. °· Avoid or limit your caffeine use as told by your health care provider. °Contact a health care provider if: °· You feel like your heart is beating too quickly or your pulse is not regular. °· You have a serious muscle cramp that does not go away. °Get help right away if: ° °· You have discomfort in your chest. °· You are dizzy or you feel faint. °· You have trouble breathing or you are short of breath. °· Your speech is slurred. °· You have trouble moving an arm or leg on one side of your body. °· Your fingers or toes turn cold or blue. °This information is not intended to replace advice given to you by your health care provider. Make sure you discuss any questions you have with your health care provider. °Document Released: 11/15/2012 Document Revised: 08/29/2015 Document Reviewed: 08/01/2015 °Elsevier Interactive Patient Education © 2019 [employer] Inc[employer] ° °

## 2018-07-22 ENCOUNTER — Other Ambulatory Visit: Payer: Self-pay | Admitting: Internal Medicine

## 2018-07-23 ENCOUNTER — Telehealth: Payer: Self-pay | Admitting: Student

## 2018-07-23 ENCOUNTER — Encounter (HOSPITAL_COMMUNITY): Payer: Self-pay | Admitting: Cardiology

## 2018-07-23 ENCOUNTER — Telehealth: Payer: Self-pay | Admitting: Internal Medicine

## 2018-07-23 NOTE — Telephone Encounter (Signed)
Cardiology Moonlighter Note  Returned page from patient. Had a cardioversion 2 days ago. Today noticed her feet are swollen. Recently increased lasix to 2 tablets BID. Today feet very swollen. No SOB. Called earlier and was recommended to increase by 20mg  tonight. Took 2.5 tablets (100mg  total) tonight. Has not noticed an increase in UOP. Last urine episode was 3 hours ago. No SOB. No CP. No palpitations or any other symptoms.   Advised patient to take the other 20mg  tablet of lasix and continue to elevate feet. Continue to monitor symptoms. If no improvement by morning then contact her cardiologist. I advised her that it might be reasonable to consider switching to torsemide but will need lab monitoring if this is done. Patient in agreement. She will call back or come to the ED if she develops any symptoms of chest pain, shortness of breath, palpitations, syncope, or any other new or concerning symptom.  A copy of this note will be sent to the patient's outpatient cardiologist.  Sarah Mowers, MD Cardiology Fellow, PGY-6

## 2018-07-23 NOTE — Telephone Encounter (Signed)
    Patient called the after hours line reporting swelling along the top of her feet bilaterally for the past day. She does not have scales but feels like her weight has been stable. Denies any pain along her lower extremities (doppler study negative for DVT earlier this month). Breathing has been stable and she denies any dyspnea on exertion, orthopnea, or PND. No chest pain or palpitations.   Recommended she elevate her lower extremities today. She does not have compression stockings but reviewed these would be helpful. She reports good compliance with Lasix 80mg  BID. Recommended she take an extra 40mg  of Lasix this evening if symptoms do not improve with conservative measures.   She will call the office to make [place] aware if symptoms do not improve or change in the interim.   Signed, Erma Heritage, PA-C 07/23/2018, 10:58 AM Pager: [phone]

## 2018-07-24 ENCOUNTER — Other Ambulatory Visit: Payer: Self-pay | Admitting: Cardiology

## 2018-07-24 ENCOUNTER — Telehealth (HOSPITAL_COMMUNITY): Payer: Self-pay | Admitting: *Deleted

## 2018-07-24 NOTE — Telephone Encounter (Signed)
Pt called stating she called the after hours line b/c she had a DCCV last week and had swelling in her feet this week. Pt stated she called the after hours line and they advised her to take extra 20mg  of lasix. Pt stated she swelling went down but it took longer than normal for her to urinate after taking extra diuretic. Pt denies any symptoms at this time.

## 2018-07-24 NOTE — Telephone Encounter (Signed)
Please have her come in and see Amy this week if possible

## 2018-07-25 NOTE — Telephone Encounter (Signed)
Spoke with patient this morning, pt reporting improvement of her swollen feet. Pt reports feeling much better. Pt denies any HF symptoms and voiding well/adequate.  Appt made for June 29th and patient amenable to date and time. Advised patient should she experience any worsening symptoms to call office for a sooner appt.  Pt appreciative of call and will let us know if anything changes.

## 2018-07-28 ENCOUNTER — Telehealth (HOSPITAL_COMMUNITY): Payer: Self-pay

## 2018-07-28 NOTE — Telephone Encounter (Signed)
Pt complaining that her L ft is still swollen. States that she has no other complaints at this time. She did say that it is a lot better from before ( see Jasmine CMA note).

## 2018-07-28 NOTE — Telephone Encounter (Signed)
Spoke to pt and relayed message from Amy NP-C. Pt verbalized understanding.

## 2018-07-28 NOTE — Telephone Encounter (Signed)
Continue current regimen. Elevate lower extremities if she is able.   Please call.  Amy Clegg NP-C  11:53 AM

## 2018-07-31 DIAGNOSIS — H353211 Exudative age-related macular degeneration, right eye, with active choroidal neovascularization: Secondary | ICD-10-CM | POA: Diagnosis not present

## 2018-07-31 DIAGNOSIS — H353222 Exudative age-related macular degeneration, left eye, with inactive choroidal neovascularization: Secondary | ICD-10-CM | POA: Diagnosis not present

## 2018-08-01 DIAGNOSIS — S0501XA Injury of conjunctiva and corneal abrasion without foreign body, right eye, initial encounter: Secondary | ICD-10-CM | POA: Diagnosis not present

## 2018-08-01 DIAGNOSIS — H02052 Trichiasis without entropian right lower eyelid: Secondary | ICD-10-CM | POA: Diagnosis not present

## 2018-08-02 DIAGNOSIS — H903 Sensorineural hearing loss, bilateral: Secondary | ICD-10-CM | POA: Diagnosis not present

## 2018-08-02 DIAGNOSIS — Z8669 Personal history of other diseases of the nervous system and sense organs: Secondary | ICD-10-CM | POA: Diagnosis not present

## 2018-08-07 ENCOUNTER — Encounter (HOSPITAL_COMMUNITY): Payer: Self-pay

## 2018-08-07 ENCOUNTER — Other Ambulatory Visit: Payer: Self-pay

## 2018-08-07 ENCOUNTER — Ambulatory Visit (HOSPITAL_COMMUNITY)
Admission: RE | Admit: 2018-08-07 | Discharge: 2018-08-07 | Disposition: A | Discharge status: Home / Self Care | Payer: Medicare Other | Source: Ambulatory Visit | Attending: Cardiology | Admitting: Cardiology

## 2018-08-07 VITALS — BP 110/74 | HR 93 | Wt 110.4 lb

## 2018-08-07 DIAGNOSIS — I5033 Acute on chronic diastolic (congestive) heart failure: Secondary | ICD-10-CM | POA: Insufficient documentation

## 2018-08-07 DIAGNOSIS — I340 Nonrheumatic mitral (valve) insufficiency: Secondary | ICD-10-CM | POA: Diagnosis not present

## 2018-08-07 DIAGNOSIS — I495 Sick sinus syndrome: Secondary | ICD-10-CM | POA: Insufficient documentation

## 2018-08-07 DIAGNOSIS — I5032 Chronic diastolic (congestive) heart failure: Secondary | ICD-10-CM | POA: Diagnosis not present

## 2018-08-07 DIAGNOSIS — E039 Hypothyroidism, unspecified: Secondary | ICD-10-CM | POA: Insufficient documentation

## 2018-08-07 DIAGNOSIS — I4891 Unspecified atrial fibrillation: Secondary | ICD-10-CM | POA: Insufficient documentation

## 2018-08-07 DIAGNOSIS — Z8249 Family history of ischemic heart disease and other diseases of the circulatory system: Secondary | ICD-10-CM | POA: Insufficient documentation

## 2018-08-07 DIAGNOSIS — Z87891 Personal history of nicotine dependence: Secondary | ICD-10-CM | POA: Diagnosis not present

## 2018-08-07 DIAGNOSIS — I110 Hypertensive heart disease with heart failure: Secondary | ICD-10-CM | POA: Insufficient documentation

## 2018-08-07 DIAGNOSIS — Z79899 Other long term (current) drug therapy: Secondary | ICD-10-CM | POA: Diagnosis not present

## 2018-08-07 DIAGNOSIS — M7989 Other specified soft tissue disorders: Secondary | ICD-10-CM | POA: Diagnosis not present

## 2018-08-07 DIAGNOSIS — I480 Paroxysmal atrial fibrillation: Secondary | ICD-10-CM | POA: Diagnosis not present

## 2018-08-07 DIAGNOSIS — I10 Essential (primary) hypertension: Secondary | ICD-10-CM

## 2018-08-07 LAB — BASIC METABOLIC PANEL
Anion gap: 7 (ref 5–15)
BUN: 24 mg/dL — ABNORMAL HIGH (ref 8–23)
CO2: 32 mmol/L (ref 22–32)
Calcium: 10.9 mg/dL — ABNORMAL HIGH (ref 8.9–10.3)
Chloride: 99 mmol/L (ref 98–111)
Creatinine, Ser: 1.17 mg/dL — ABNORMAL HIGH (ref 0.44–1.00)
GFR calc Af Amer: 49 mL/min — ABNORMAL LOW (ref >60)
GFR calc non Af Amer: 42 mL/min — ABNORMAL LOW (ref >60)
Glucose, Bld: 76 mg/dL (ref 70–99)
Potassium: 4.1 mmol/L (ref 3.5–5.1)
Sodium: 138 mmol/L (ref 135–145)

## 2018-08-07 MED ORDER — AMIODARONE HCL 200 MG PO TABS: 200.0000 mg | ORAL_TABLET | Freq: Every day | ORAL | 3 refills | Status: DC

## 2018-08-07 NOTE — Patient Instructions (Signed)
Lab work done today. We will notify you of any abnormal lab work. No news is good news!  You have been referred to [facility].   Please follow up with the [facility] in 8 weeks.  At the [facility], you and your health needs are our priority. As part of our continuing mission to provide you with exceptional heart care, we have created designated Provider Care Teams. These Care Teams include your primary Cardiologist (physician) and Advanced Practice Providers (APPs- Physician Assistants and Nurse Practitioners) who all work together to provide you with the care you need, when you need it.   You may see any of the following providers on your designated Care Team at your next follow up: [name] Kitchen Dr Glori Bickers . Dr Loralie Champagne . Darrick Grinder, NP

## 2018-08-07 NOTE — Progress Notes (Signed)
Patient ID: Sarah Mays, female   DOB: Aug 17, 1930, 85 y.o.   MRN: [id number] PCP: Dr Hollace Kinnier Cardiology: Dr. Aundra Dubin  85 y.o. with paroxysmal atrial fibrillation and chronic diastolic CHF returns for cardiology evaluation.  In 7/16, she was admitted with chest pain.  Cardiolite showed no ischemia or infarction.  Echo in 6/17 showed EF 60-65%.  She developed problems elevated HR when in atrial fibrillation but bradycardia when in NSR.  She underwent atrial fibrillation ablation in 11/17.  She is tolerating Xarelto without melena or BRBPR.  Dr. Rayann Heman had her stop amiodarone.    Echo in 11/18 showed EF 60-65%, PASP 47 mmHg.   TEE-guided DCCV back to NSR in 6/19.  TEE showed EF 55-60% with moderate-severe MR.   She developed multiple episodes of epistaxis with Xarelto use.  We have transitioned her to apixaban 2.5 mg bid and she has not had epistaxis since then.   Today she returns for HF follow up due to LLE edema. On 07/21/18 she underwent succesful cardioversion. A few days later she noticed LLE edema. Lasix was increased to 80 mg twice a day. Overall feeling fine. Denies SOB/PND/Orthopnea. No bleeding issues. Appetite ok. No fever or chills. She does not weigh at home. Taking all medications. No bleeding issues.   ECG: A fib  83 bpm   Labs (7/16): K 4, creatinine 0.94, calcium 10.8, LFTs normal Labs (9/16): K 4, creatinine 0.9, LDL 69, TSH normal, HCT 42.3 Labs (5/17): K 4.6, creatinine 0.94, HCT 41.5, LFTs normal. Labs (6/17): K 5, creatinine 1.04, LDL 42, HDL 101, TSH normal Labs (12/17): K 3.7, creatinine 0.76, hgb 11.2 Labs (4/18): K 4.6, creatinine 0.91 Labs (10/18): K 4.5, creatinine 0.62 Labs (12/18): hgb 13.5 Labs (1/19): K 4.3, creatinine 0.6 Labs (4/19): hgb 13.5 Labs (7/19): K 4.5, creatinine 0.5, hgb 12.1 Labs (11/19): K 3.7, creatinine 0.95  PMH: 1. Atrial fibrillation: Diagnosed initially in 10/13.  Holter monitor in 11/13 showed atrial fibrillation with average  rate 65.  - Atrial fibrillation ablation in 11/17.  - TEE-guided DCCV in 6/19. - DC-CV 07/21/18 -->NSr   2. Chronic diastolic CHF: Echo (72/62) with EF 65-70%, mild MR, moderate biatrial enlargement, moderate TR, PA systolic pressure 45 mmHg.   Echo (10/15): EF 60-65%.  Echo (7/16) with EF 60-65%, mild MR.  - Echo (6/17): EF 60-65%, PASP 41 mmHg.  - Echo (11/18): EF 60-65%, PASP 47 mmHg - TEE (6/19): EF 55-60%, mild LVH, mildly decreased RV systolic function, peak RV-RA gradient 40 mmHg, moderate-severe MR with bileaflet MVP, ERO 0.37 cm^2.  3. ETT-Sestamibi (11/13): Exercised to stage II, small partially reversible anteroapical perfusion defect suggesting mild ischemia versus attenuation, EF 80%.  Cardiolite (7/16) with EF 64%, no ischemia or infarction.  4. HTN: Lower extremity swelling with amlodipine.  5. Polycythemia vera: Has had phlebotomy only once.  6. Hypothyroidism 7. Macular degeneration 8. TAH 1980 9. Familial hypocalciuric hypercalcemia 10. PFTs (amiodarone use) in 1/14 showed a mild obstructive defect.  11. Degenerative disc disease 12. Sick sinus syndrome: Has not required PPM.  13. Mitral regurgitation: TEE (6/19) with moderate-severe MR with bileaflet MVP, ERO 0.37 cm^2.   SH: Prior smoker (years ago).  Widowed, lived in [place] but now at [place]  Occasional ETOH.  Daughter works for [employer].   FH: CAD  Review of systems complete and found to be negative unless listed in HPI.    Current Outpatient Medications  Medication Sig Dispense Refill  . acetaminophen (TYLENOL) 325 MG  tablet Take 2 tablets (650 mg total) by mouth every 4 (four) hours as needed for mild pain or moderate pain. Not to exceed 3g in 24h 120 tablet 0  . amiodarone (PACERONE) 200 MG tablet Take 1 tablet (200 mg total) by mouth 2 (two) times daily for 14 days, THEN 1 tablet (200 mg total) daily. 90 tablet 3  . Cholecalciferol (VITAMIN D) 2000 units CAPS Take 2,000 Units by mouth daily.     [name] Kitchen  docusate sodium (COLACE) 100 MG capsule Take 200 mg by mouth 2 (two) times daily.    [name] Kitchen ELIQUIS 2.5 MG TABS tablet TAKE 1 TABLET BY MOUTH TWICE DAILY. 60 tablet 0  . fentaNYL (DURAGESIC) 25 MCG/HR Apply one patient onto skin every 3 (three) days for pain 10 patch 0  . furosemide (LASIX) 80 MG tablet Take 1 tablet (80 mg total) by mouth 2 (two) times daily. 60 tablet 6  . gabapentin (NEURONTIN) 100 MG capsule TAKE THREE (3) CAPSULES AT BEDTIME. 90 capsule 0  . hydrALAZINE (APRESOLINE) 50 MG tablet TAKE 1 1/2 TABLETS THREE TIMES DAILY. (Patient taking differently: Take 75 mg by mouth 3 (three) times daily. ) 135 tablet 0  . hydroxyurea (HYDREA) 500 MG capsule 500 mg on Mondays, Wednesdays and Fridays and 1000 mg the other days of the week by mouth (Patient taking differently: Take 500-1,000 mg by mouth See admin instructions. 500 mg on Mondays, Wednesdays and Fridays and 1000 mg the other days of the week by mouth) 100 capsule 9  . levothyroxine (SYNTHROID) 100 MCG tablet TAKE 1 TABLET ONCE DAILY BEFORE BREAKFAST. 90 tablet 0  . Magnesium 250 MG TABS Take 250 mg by mouth daily.     . Melatonin 10 MG TABS Take 10 mg by mouth at bedtime.    . Multiple Vitamins-Minerals (PRESERVISION AREDS 2) CAPS Take 1 capsule by mouth 2 (two) times daily.     . potassium chloride (K-DUR) 10 MEQ tablet Take 10-20 mEq by mouth See admin instructions. 2 tablets in the morning. 1 tablet in the evening    . sodium chloride (OCEAN) 0.65 % SOLN nasal spray Place 2 sprays into both nostrils as needed for congestion.    . traMADol (ULTRAM) 50 MG tablet TAKE ONE TABLET AT BEDTIME. 30 tablet 0   No current facility-administered medications for this encounter.    Vitals:   08/07/18 1411  BP: 110/74  Pulse: 93  SpO2: 94%  Weight: 50.1 kg (110 lb 6.4 oz)    Wt Readings from Last 3 Encounters:  08/07/18 50.1 kg (110 lb 6.4 oz)  07/21/18 47.6 kg (105 lb)  07/17/18 47 kg (103 lb 9.6 oz)   Physical Exam General:  Well  appearing. No resp difficulty HEENT: normal Neck: supple. JVP 6-7. Carotids 2+ bilat; no bruits. No lymphadenopathy or thryomegaly appreciated. Cor: PMI nondisplaced. Irregular Regular rate & rhythm. No rubs, gallops or murmurs. Lungs: clear Abdomen: soft, nontender, nondistended. No hepatosplenomegaly. No bruits or masses. Good bowel sounds. Extremities: no cyanosis, clubbing, rash, LLE trace edema by the ankle.  Neuro: alert & orientedx3, cranial nerves grossly intact. moves all 4 extremities w/o difficulty. Affect pleasant  EKG: A fib 83 bpm  Assessment/Plan: 1. Atrial fibrillation: First noted in 10/13.  Atrial fibrillation has triggered acute on chronic diastolic CHF in the past.  CHADSVASC score is 70 (age, female gender, HTN, CHF).  She is anticoagulated with Xarelto 15 mg daily which is appropriate for her creatinine clearance.  She had atrial  fibrillation ablation in 11/17.   TEE-guided DCCV in 6/19.     - She is not on nodal blockers due to h/o sick sinus syndrome.  - Xarelto stopped and apixaban 2.5 mg bid started due to epistaxis, no epistaxis on apixaban.  - On 6/12 she underwent cardioversion and was put on back on amio. Initially back in NSR but sound like she went back in A fib a few days later. Today she is in A fib.  Discussed with Dr Aundra Dubin. Refer to A fib clinic. ? Rate control.  2. Chronic diastolic CHF:  - NYHA II. Volume status stable. Continue current diuretic regimen.   - Continue KCl 6mEq bid.  3. HTN: Stable.  4. Bradycardia: Suspect she has a degree of sick sinus syndrome. This has been stable off nodal blocking agents.  5. Mitral regurgitation: Moderate to severe MR in setting of bileaflet prolapse on TEE in 6/19.  - 07/2018 MR mild-moderate on ECHO. Repeat ECHO in 1 year.    Refer to A fib clinic.  Follow up with Dr Aundra Dubin in 8 weeks.      Davonta Stroot NP-C  08/07/2018

## 2018-08-08 ENCOUNTER — Other Ambulatory Visit: Payer: Self-pay | Admitting: *Deleted

## 2018-08-08 DIAGNOSIS — M545 Low back pain, unspecified: Secondary | ICD-10-CM

## 2018-08-08 NOTE — Telephone Encounter (Signed)
Patient requested refill LR Epic 07/07/2018 Pended Rx and sent to Dr. Sheppard Coil for approval.   (Dr. Mariea Clonts out of office)

## 2018-08-09 ENCOUNTER — Telehealth: Payer: Self-pay

## 2018-08-09 MED ORDER — FENTANYL 25 MCG/HR TD PT72: MEDICATED_PATCH | TRANSDERMAL | 0 refills | Status: DC

## 2018-08-09 NOTE — Telephone Encounter (Signed)
-----   Message from Heath Lark, MD sent at 08/09/2018 11:27 AM EDT ----- Regarding: next week appt Instead of seeing me at 245 can she see me at 145 instead? She needs labs before appt

## 2018-08-09 NOTE — Telephone Encounter (Signed)
Called and given below message. She verbalized understanding. She will come in at 1:15 for labs and see Dr. Alvy Bimler at 1:45

## 2018-08-14 ENCOUNTER — Other Ambulatory Visit: Payer: Self-pay

## 2018-08-14 ENCOUNTER — Other Ambulatory Visit: Payer: Medicare Other

## 2018-08-14 ENCOUNTER — Encounter: Payer: Self-pay | Admitting: Hematology and Oncology

## 2018-08-14 ENCOUNTER — Ambulatory Visit: Payer: Medicare Other | Admitting: Hematology and Oncology

## 2018-08-14 ENCOUNTER — Inpatient Hospital Stay: Payer: Medicare Other | Attending: Hematology and Oncology | Admitting: Hematology and Oncology

## 2018-08-14 ENCOUNTER — Inpatient Hospital Stay: Payer: Medicare Other

## 2018-08-14 DIAGNOSIS — Z79899 Other long term (current) drug therapy: Secondary | ICD-10-CM | POA: Diagnosis not present

## 2018-08-14 DIAGNOSIS — Z880 Allergy status to penicillin: Secondary | ICD-10-CM

## 2018-08-14 DIAGNOSIS — I509 Heart failure, unspecified: Secondary | ICD-10-CM

## 2018-08-14 DIAGNOSIS — Z888 Allergy status to other drugs, medicaments and biological substances status: Secondary | ICD-10-CM | POA: Diagnosis not present

## 2018-08-14 DIAGNOSIS — Z7901 Long term (current) use of anticoagulants: Secondary | ICD-10-CM | POA: Diagnosis not present

## 2018-08-14 DIAGNOSIS — D45 Polycythemia vera: Secondary | ICD-10-CM | POA: Diagnosis not present

## 2018-08-14 DIAGNOSIS — D630 Anemia in neoplastic disease: Secondary | ICD-10-CM | POA: Diagnosis not present

## 2018-08-14 DIAGNOSIS — Z881 Allergy status to other antibiotic agents status: Secondary | ICD-10-CM | POA: Insufficient documentation

## 2018-08-14 LAB — CBC WITH DIFFERENTIAL/PLATELET
Abs Immature Granulocytes: 0.03 10*3/uL (ref 0.00–0.07)
Basophils Absolute: 0 10*3/uL (ref 0.0–0.1)
Basophils Relative: 0 %
Eosinophils Absolute: 0 10*3/uL (ref 0.0–0.5)
Eosinophils Relative: 1 %
HCT: 35.8 % — ABNORMAL LOW (ref 36.0–46.0)
Hemoglobin: 11.5 g/dL — ABNORMAL LOW (ref 12.0–15.0)
Immature Granulocytes: 0 %
Lymphocytes Relative: 18 %
Lymphs Abs: 1.3 10*3/uL (ref 0.7–4.0)
MCH: 36.9 pg — ABNORMAL HIGH (ref 26.0–34.0)
MCHC: 32.1 g/dL (ref 30.0–36.0)
MCV: 114.7 fL — ABNORMAL HIGH (ref 80.0–100.0)
Monocytes Absolute: 0.6 10*3/uL (ref 0.1–1.0)
Monocytes Relative: 8 %
Neutro Abs: 5.2 10*3/uL (ref 1.7–7.7)
Neutrophils Relative %: 73 %
Platelets: 360 10*3/uL (ref 150–400)
RBC: 3.12 MIL/uL — ABNORMAL LOW (ref 3.87–5.11)
RDW: 16.3 % — ABNORMAL HIGH (ref 11.5–15.5)
WBC: 7.1 10*3/uL (ref 4.0–10.5)
nRBC: 0 % (ref 0.0–0.2)

## 2018-08-14 NOTE — Progress Notes (Signed)
[facility] OFFICE PROGRESS NOTE  Patient Care Team: Gayland Curry, DO as PCP - General (Geriatric Medicine) Larey Dresser, MD as PCP - Cardiology (Cardiology) Community, [facility], Clent Demark, MD as Consulting Physician (Ophthalmology) Larey Dresser, MD as Consulting Physician (Cardiology) Heath Lark, MD as Consulting Physician (Hematology and Oncology) Clent Jacks, MD as Consulting Physician (Ophthalmology) Jerrell Belfast, MD as Consulting Physician (Otolaryngology)  ASSESSMENT & PLAN:  Polycythemia vera Her CBC is stable The patient stated she has been compliant taking medications as directed She would take Hydroxyurea 500 mg on Mondays, Wednesdays and Fridays and to take 1000 mg for the rest of the week Her platelet count has normalized and her mild chronic anemia is stable I do not plan to adjust the dose of her treatment Plan to see her back in 4 months for further follow-up She is educated to watch out for signs and symptoms of bleeding or infection  Anemia in neoplastic disease This is likely anemia of chronic disease. The patient denies recent history of bleeding such as epistaxis, hematuria or hematochezia. She is asymptomatic from the anemia. We will observe for now.     No orders of the defined types were placed in this encounter.   INTERVAL HISTORY: Please see below for problem oriented charting. She returns for further follow-up She was recently hospitalized for congestive heart failure She denies cough, chest pain or shortness of breath No recent bleeding.  SUMMARY OF ONCOLOGIC HISTORY: Oncology History  Polycythemia vera ([place])  08/05/2011 Pathology Results   Peripheral blood JAK2 mutation was positive with low serum erythropoietin level. Bone marrow aspirate and biopsy was not performed.   08/12/2011 -  Chemotherapy   She is started on hydroxyurea with periodic phlebotomy.     REVIEW OF SYSTEMS:   Constitutional:  Denies fevers, chills or abnormal weight loss Eyes: Denies blurriness of vision Ears, nose, mouth, throat, and face: Denies mucositis or sore throat Gastrointestinal:  Denies nausea, heartburn or change in bowel habits Skin: Denies abnormal skin rashes Lymphatics: Denies new lymphadenopathy or easy bruising Neurological:Denies numbness, tingling or new weaknesses Behavioral/Psych: Mood is stable, no new changes  All other systems were reviewed with the patient and are negative.  I have reviewed the past medical history, past surgical history, social history and family history with the patient and they are unchanged from previous note.  ALLERGIES:  is allergic to amlodipine and augmentin [amoxicillin-pot clavulanate].  MEDICATIONS:  Current Outpatient Medications  Medication Sig Dispense Refill  . acetaminophen (TYLENOL) 325 MG tablet Take 2 tablets (650 mg total) by mouth every 4 (four) hours as needed for mild pain or moderate pain. Not to exceed 3g in 24h 120 tablet 0  . amiodarone (PACERONE) 200 MG tablet Take 1 tablet (200 mg total) by mouth daily for 14 days. 90 tablet 3  . Cholecalciferol (VITAMIN D) 2000 units CAPS Take 2,000 Units by mouth daily.     [name] Kitchen docusate sodium (COLACE) 100 MG capsule Take 200 mg by mouth 2 (two) times daily.    [name] Kitchen ELIQUIS 2.5 MG TABS tablet TAKE 1 TABLET BY MOUTH TWICE DAILY. 60 tablet 0  . fentaNYL (DURAGESIC) 25 MCG/HR Apply one patient onto skin every 3 (three) days for pain 10 patch 0  . furosemide (LASIX) 80 MG tablet Take 1 tablet (80 mg total) by mouth 2 (two) times daily. 60 tablet 6  . gabapentin (NEURONTIN) 100 MG capsule TAKE THREE (3) CAPSULES AT BEDTIME. [place]  capsule 0  . hydrALAZINE (APRESOLINE) 50 MG tablet TAKE 1 1/2 TABLETS THREE TIMES DAILY. (Patient taking differently: Take 75 mg by mouth 3 (three) times daily. ) 135 tablet 0  . hydroxyurea (HYDREA) 500 MG capsule 500 mg on Mondays, Wednesdays and Fridays and 1000 mg the other days of the  week by mouth (Patient taking differently: Take 500-1,000 mg by mouth See admin instructions. 500 mg on Mondays, Wednesdays and Fridays and 1000 mg the other days of the week by mouth) 100 capsule 9  . levothyroxine (SYNTHROID) 100 MCG tablet TAKE 1 TABLET ONCE DAILY BEFORE BREAKFAST. 90 tablet 0  . Magnesium 250 MG TABS Take 250 mg by mouth daily.     . Melatonin 10 MG TABS Take 10 mg by mouth at bedtime.    . Multiple Vitamins-Minerals (PRESERVISION AREDS 2) CAPS Take 1 capsule by mouth 2 (two) times daily.     . potassium chloride (K-DUR) 10 MEQ tablet Take 10-20 mEq by mouth See admin instructions. 2 tablets in the morning. 1 tablet in the evening    . sodium chloride (OCEAN) 0.65 % SOLN nasal spray Place 2 sprays into both nostrils as needed for congestion.    . traMADol (ULTRAM) 50 MG tablet TAKE ONE TABLET AT BEDTIME. 30 tablet 0   No current facility-administered medications for this visit.     PHYSICAL EXAMINATION: ECOG PERFORMANCE STATUS: 1 - Symptomatic but completely ambulatory  Vitals:   08/14/18 1338  BP: 112/60  Pulse: 80  Resp: 17  Temp: 98.9 F (37.2 C)  SpO2: 96%   Filed Weights   08/14/18 1338  Weight: 111 lb (50.3 kg)    GENERAL:alert, no distress and comfortable Musculoskeletal:no cyanosis of digits and no clubbing  NEURO: alert & oriented x 3 with fluent speech, no focal motor/sensory deficits  LABORATORY DATA:  I have reviewed the data as listed    Component Value Date/Time   NA 138 08/07/2018 1501   NA 136 (A) 08/10/2017   NA 136 09/14/2016 1428   K 4.1 08/07/2018 1501   K 4.0 09/14/2016 1428   CL 99 08/07/2018 1501   CO2 32 08/07/2018 1501   CO2 30 (H) 09/14/2016 1428   GLUCOSE 76 08/07/2018 1501   GLUCOSE 112 09/14/2016 1428   BUN 24 (H) 08/07/2018 1501   BUN 20 08/10/2017   BUN 14.7 09/14/2016 1428   CREATININE 1.17 (H) 08/07/2018 1501   CREATININE 0.8 09/14/2016 1428   CALCIUM 10.9 (H) 08/07/2018 1501   CALCIUM 11.0 (H) 09/14/2016 1428    PROT 5.5 (L) 07/26/2017 0954   PROT 7.0 09/14/2016 1428   ALBUMIN 3.8 07/26/2017 0954   ALBUMIN 4.6 09/14/2016 1428   AST 23 07/28/2017 0800   AST 17 09/14/2016 1428   ALT 30 07/28/2017 0800   ALT 18 09/14/2016 1428   ALKPHOS 62 07/28/2017 0800   ALKPHOS 62 09/14/2016 1428   BILITOT 0.5 07/26/2017 0954   BILITOT 1.00 09/14/2016 1428   GFRNONAA 42 (L) 08/07/2018 1501   GFRAA 49 (L) 08/07/2018 1501    No results found for: SPEP, UPEP  Lab Results  Component Value Date   WBC 7.1 08/14/2018   NEUTROABS 5.2 08/14/2018   HGB 11.5 (L) 08/14/2018   HCT 35.8 (L) 08/14/2018   MCV 114.7 (H) 08/14/2018   PLT 360 08/14/2018      Chemistry      Component Value Date/Time   NA 138 08/07/2018 1501   NA 136 (A) 08/10/2017  NA 136 09/14/2016 1428   K 4.1 08/07/2018 1501   K 4.0 09/14/2016 1428   CL 99 08/07/2018 1501   CO2 32 08/07/2018 1501   CO2 30 (H) 09/14/2016 1428   BUN 24 (H) 08/07/2018 1501   BUN 20 08/10/2017   BUN 14.7 09/14/2016 1428   CREATININE 1.17 (H) 08/07/2018 1501   CREATININE 0.8 09/14/2016 1428   GLU 76 08/10/2017      Component Value Date/Time   CALCIUM 10.9 (H) 08/07/2018 1501   CALCIUM 11.0 (H) 09/14/2016 1428   ALKPHOS 62 07/28/2017 0800   ALKPHOS 62 09/14/2016 1428   AST 23 07/28/2017 0800   AST 17 09/14/2016 1428   ALT 30 07/28/2017 0800   ALT 18 09/14/2016 1428   BILITOT 0.5 07/26/2017 0954   BILITOT 1.00 09/14/2016 1428      All questions were answered. The patient knows to call the clinic with any problems, questions or concerns. No barriers to learning was detected.  I spent 10 minutes counseling the patient face to face. The total time spent in the appointment was 15 minutes and more than 50% was on counseling and review of test results  Heath Lark, MD 08/14/2018 1:45 PM

## 2018-08-14 NOTE — Assessment & Plan Note (Signed)
Her CBC is stable The patient stated she has been compliant taking medications as directed She would take Hydroxyurea 500 mg on Mondays, Wednesdays and Fridays and to take 1000 mg for the rest of the week Her platelet count has normalized and her mild chronic anemia is stable I do not plan to adjust the dose of her treatment Plan to see her back in 4 months for further follow-up She is educated to watch out for signs and symptoms of bleeding or infection

## 2018-08-14 NOTE — Assessment & Plan Note (Signed)
This is likely anemia of chronic disease. The patient denies recent history of bleeding such as epistaxis, hematuria or hematochezia. She is asymptomatic from the anemia. We will observe for now.  

## 2018-08-15 ENCOUNTER — Telehealth: Payer: Self-pay | Admitting: Hematology and Oncology

## 2018-08-15 NOTE — Telephone Encounter (Signed)
I talk with patient regarding schedule  

## 2018-08-21 ENCOUNTER — Telehealth: Payer: Self-pay | Admitting: *Deleted

## 2018-08-21 NOTE — Telephone Encounter (Signed)
Calling pt to reschedule Tuesday appt for Prolia because i'm not at [facility] on Tuesday, would need to schedule on Wednesday.

## 2018-08-22 ENCOUNTER — Ambulatory Visit (HOSPITAL_COMMUNITY)
Admission: RE | Admit: 2018-08-22 | Discharge: 2018-08-22 | Disposition: A | Discharge status: Home / Self Care | Payer: Medicare Other | Source: Ambulatory Visit | Attending: Nurse Practitioner | Admitting: Nurse Practitioner

## 2018-08-22 ENCOUNTER — Other Ambulatory Visit: Payer: Self-pay

## 2018-08-22 ENCOUNTER — Ambulatory Visit: Payer: Medicare Other

## 2018-08-22 ENCOUNTER — Encounter (HOSPITAL_COMMUNITY): Payer: Self-pay | Admitting: Nurse Practitioner

## 2018-08-22 ENCOUNTER — Other Ambulatory Visit (HOSPITAL_COMMUNITY): Payer: Self-pay | Admitting: Cardiology

## 2018-08-22 VITALS — BP 122/68 | HR 102 | Ht 60.0 in | Wt 112.0 lb

## 2018-08-22 DIAGNOSIS — Z7901 Long term (current) use of anticoagulants: Secondary | ICD-10-CM | POA: Insufficient documentation

## 2018-08-22 DIAGNOSIS — M109 Gout, unspecified: Secondary | ICD-10-CM | POA: Diagnosis not present

## 2018-08-22 DIAGNOSIS — M810 Age-related osteoporosis without current pathological fracture: Secondary | ICD-10-CM | POA: Diagnosis not present

## 2018-08-22 DIAGNOSIS — G4700 Insomnia, unspecified: Secondary | ICD-10-CM | POA: Insufficient documentation

## 2018-08-22 DIAGNOSIS — E039 Hypothyroidism, unspecified: Secondary | ICD-10-CM | POA: Insufficient documentation

## 2018-08-22 DIAGNOSIS — E7800 Pure hypercholesterolemia, unspecified: Secondary | ICD-10-CM | POA: Diagnosis not present

## 2018-08-22 DIAGNOSIS — Z79899 Other long term (current) drug therapy: Secondary | ICD-10-CM | POA: Insufficient documentation

## 2018-08-22 DIAGNOSIS — Z87891 Personal history of nicotine dependence: Secondary | ICD-10-CM | POA: Diagnosis not present

## 2018-08-22 DIAGNOSIS — I110 Hypertensive heart disease with heart failure: Secondary | ICD-10-CM | POA: Diagnosis not present

## 2018-08-22 DIAGNOSIS — I5032 Chronic diastolic (congestive) heart failure: Secondary | ICD-10-CM | POA: Diagnosis not present

## 2018-08-22 DIAGNOSIS — I480 Paroxysmal atrial fibrillation: Secondary | ICD-10-CM | POA: Diagnosis not present

## 2018-08-22 DIAGNOSIS — I4819 Other persistent atrial fibrillation: Secondary | ICD-10-CM | POA: Diagnosis not present

## 2018-08-22 DIAGNOSIS — H9192 Unspecified hearing loss, left ear: Secondary | ICD-10-CM | POA: Diagnosis not present

## 2018-08-22 MED ORDER — AMIODARONE HCL 200 MG PO TABS: ORAL_TABLET | ORAL | 3 refills | Status: DC

## 2018-08-22 NOTE — Progress Notes (Signed)
Primary Care Physician: Gayland Curry, DO Referring Physician: Dr. 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 female with a h/o paroxysmal atrial fibrillation, s/p ablation 08/2014, by Dr. Rayann Heman, being seen in the [place] clinic for recent successful cardioversion but ERAF. He was seen by Dr. Aundra Dubin early June and found to be in afib . He restarted  amiodarone at 200 mg bid x 2 weeks then 200 mg daily. She has been on this years ago but stopped as she was maintaining SR after ablation.She had successful cardioversion but ERAF. Today, she admits to being mildly short of breath with activities and some increased ankle edema. Her EKG shows afib at 102 bpm.   Today, she denies symptoms of palpitations, chest pain, shortness of breath, orthopnea, PND, lower extremity edema, dizziness, presyncope, syncope, or neurologic sequela. The patient is tolerating medications without difficulties and is otherwise without complaint today.   Past Medical History:  Diagnosis Date  . Abnormal stress test    a. 11/2011 Ex MV: EF 80%, small, partially reversible anteroapical defect->mild ischemia vs attenuation-->med Rx.  [name] Kitchen Alopecia, unspecified   . Arthritis   . Chronic diastolic CHF (congestive heart failure) ([place])    a. 11/2013 Echo: EF 60-65%, no rwma, mild TR, PASP 50mmHg.  . Closed fracture of lower end of radius with ulna   . Deafness    Left, s/p multiple surgeries  . Essential hypertension   . Gouty arthropathy, unspecified   . History of kidney stones   . Hypothyroidism   . Insomnia, unspecified   . Macular degeneration    Left, s/p inj. tx  . Mitral valve disorders(424.0)   . Neck mass    right, w/u including MRI negative  . PAF (paroxysmal atrial fibrillation) ([facility])    a. on amio (02/2012 mild obstruction on PFT's) and xarelto.  . Polycythemia vera(238.4)   . Pure hypercholesterolemia   . Senile osteoporosis    Reclast in the past  . Tricuspid valve disorders, specified as  nonrheumatic    Past Surgical History:  Procedure Laterality Date  . BREAST BIOPSY  06/18/1998   left  . CARDIOVERSION N/A 07/15/2017   Procedure: CARDIOVERSION;  Surgeon: Larey Dresser, MD;  Location: [facility] ENDOSCOPY;  Service: Cardiovascular;  Laterality: N/A;  . CARDIOVERSION N/A 07/21/2018   Procedure: CARDIOVERSION;  Surgeon: Larey Dresser, MD;  Location: [facility] ENDOSCOPY;  Service: Cardiovascular;  Laterality: N/A;  . ELECTROPHYSIOLOGIC STUDY N/A 01/08/2016   Procedure: Atrial Fibrillation Ablation;  Surgeon: Thompson Grayer, MD;  Location: [facility] CV LAB;  Service: Cardiovascular;  Laterality: N/A;  . FEMUR IM NAIL Right 10/08/2015   Procedure: INTRAMEDULLARY (IM) NAIL FEMORAL RIGHT;  Surgeon: Paralee Cancel, MD;  Location: [facility] ORS;  Service: Orthopedics;  Laterality: Right;  . MASS EXCISION Right 11/26/2016   Procedure: EXCISION RIGHT LATERAL NECK MASS;  Surgeon: Jerrell Belfast, MD;  Location: [place] [facility];  Service: ENT;  Laterality: Right;  . TEE WITHOUT CARDIOVERSION N/A 07/15/2017   Procedure: TRANSESOPHAGEAL ECHOCARDIOGRAM (TEE);  Surgeon: Larey Dresser, MD;  Location: [facility] ENDOSCOPY;  Service: Cardiovascular;  Laterality: N/A;  . TONSILLECTOMY    . TOTAL ABDOMINAL HYSTERECTOMY    . TYMPANOPLASTY Bilateral     Current Outpatient Medications  Medication Sig Dispense Refill  . acetaminophen (TYLENOL) 325 MG tablet Take 2 tablets (650 mg total) by mouth every 4 (four) hours as needed for mild pain or moderate pain. Not to exceed 3g in 24h 120 tablet  0  . amiodarone (PACERONE) 200 MG tablet Take 1 tablet twice a day for 2 weeks. 90 tablet 3  . Cholecalciferol (VITAMIN D) 2000 units CAPS Take 2,000 Units by mouth daily.     [name] Kitchen docusate sodium (COLACE) 100 MG capsule Take 200 mg by mouth 2 (two) times daily.    [name] Kitchen ELIQUIS 2.5 MG TABS tablet TAKE 1 TABLET BY MOUTH TWICE DAILY. 60 tablet 1  . fentaNYL (DURAGESIC) 25 MCG/HR Apply one patient onto skin every 3 (three) days for pain 10 patch 0  .  furosemide (LASIX) 80 MG tablet Take 1 tablet (80 mg total) by mouth 2 (two) times daily. 60 tablet 6  . gabapentin (NEURONTIN) 100 MG capsule TAKE THREE (3) CAPSULES AT BEDTIME. 90 capsule 0  . hydrALAZINE (APRESOLINE) 50 MG tablet TAKE 1 1/2 TABLETS THREE TIMES DAILY. (Patient taking differently: Take 75 mg by mouth 3 (three) times daily. ) 135 tablet 0  . hydroxyurea (HYDREA) 500 MG capsule 500 mg on Mondays, Wednesdays and Fridays and 1000 mg the other days of the week by mouth (Patient taking differently: Take 500-1,000 mg by mouth See admin instructions. 500 mg on Mondays, Wednesdays and Fridays and 1000 mg the other days of the week by mouth) 100 capsule 9  . levothyroxine (SYNTHROID) 100 MCG tablet TAKE 1 TABLET ONCE DAILY BEFORE BREAKFAST. 90 tablet 0  . Magnesium 250 MG TABS Take 250 mg by mouth daily.     . Melatonin 10 MG TABS Take 10 mg by mouth at bedtime.    . Multiple Vitamins-Minerals (PRESERVISION AREDS 2) CAPS Take 1 capsule by mouth 2 (two) times daily.     . potassium chloride (K-DUR) 10 MEQ tablet Take 10-20 mEq by mouth See admin instructions. 2 tablets in the morning. 1 tablet in the evening    . sodium chloride (OCEAN) 0.65 % SOLN nasal spray Place 2 sprays into both nostrils as needed for congestion.    . traMADol (ULTRAM) 50 MG tablet TAKE ONE TABLET AT BEDTIME. 30 tablet 0   No current facility-administered medications for this encounter.     Allergies  Allergen Reactions  . Amlodipine Swelling and Other (See Comments)    Reaction:  Lower extremity swelling   . Augmentin [Amoxicillin-Pot Clavulanate] Other (See Comments)    Stomach Pain/cramps    Social History   Socioeconomic History  . Marital status: Married    Spouse name: [name]  . Number of children: 2  . Years of education: 47  . Highest education level: Not on file  Occupational History  . Not on file  Social Needs  . Financial resource strain: Not on file  . Food insecurity    Worry: Not on file     Inability: Not on file  . Transportation needs    Medical: Not on file    Non-medical: Not on file  Tobacco Use  . Smoking status: Former Smoker    Types: Cigarettes    Quit date: 01/19/1984    Years since quitting: 34.6  . Smokeless tobacco: Never Used  Substance and Sexual Activity  . Alcohol use: Yes    Alcohol/week: 1.0 standard drinks    Types: 1 Glasses of wine per week    Comment: 2 per day/ 14 a week  . Drug use: No  . Sexual activity: Not Currently  Lifestyle  . Physical activity    Days per week: Not on file    Minutes per session: Not on file  .  Stress: Not on file  Relationships  . Social Herbalist on phone: Not on file    Gets together: Not on file    Attends religious service: Not on file    Active member of club or organization: Not on file    Attends meetings of clubs or organizations: Not on file    Relationship status: Not on file  . Intimate partner violence    Fear of current or ex partner: Not on file    Emotionally abused: Not on file    Physically abused: Not on file    Forced sexual activity: Not on file  Other Topics Concern  . Not on file  Social History Narrative   Patient is Married 1955. 2 kids. 32 grandkid-85 years old in 2016. College graduate [name] [place] [employer]).    Lives in apartment,  Independent Living  section at [facility] since 02/2013.      Stay at home mother.       No Smoking history, Mod. alcohol use.   Patient has a living will, POA      Hobbies: CPU and painting          Family History  Problem Relation Age of Onset  . Hypertension Father   . Heart disease Father        patient does not know details.   . Ovarian cancer Mother 61  . Cancer Sister        colon  . Heart disease Sister   . Cancer Sister   . Cancer Sister        hodgkin disease  . Breast cancer Daughter   . Cancer Daughter        breast    ROS- All systems are reviewed and negative except as per the HPI above   Physical Exam: Vitals:   08/22/18 1439  BP: 122/68  Pulse: (!) 102  Weight: 50.8 kg  Height: 5' (1.524 m)   Wt Readings from Last 3 Encounters:  08/22/18 50.8 kg  08/14/18 50.3 kg  08/07/18 50.1 kg    Labs: Lab Results  Component Value Date   NA 138 08/07/2018   K 4.1 08/07/2018   CL 99 08/07/2018   CO2 32 08/07/2018   GLUCOSE 76 08/07/2018   BUN 24 (H) 08/07/2018   CREATININE 1.17 (H) 08/07/2018   CALCIUM 10.9 (H) 08/07/2018   Lab Results  Component Value Date   INR 1.12 07/07/2017   Lab Results  Component Value Date   CHOL 194 07/28/2017   HDL 72 (A) 07/28/2017   LDLCALC 102 07/28/2017   TRIG 99 07/28/2017     GEN- The patient is well appearing, alert and oriented x 3 today.   Head- normocephalic, atraumatic Eyes-  Sclera clear, conjunctiva pink Ears- hearing intact Oropharynx- clear Neck- supple, no JVP Lymph- no cervical lymphadenopathy Lungs- Clear to ausculation bilaterally, normal work of breathing Heart- irregular rate and rhythm, no murmurs, rubs or gallops, PMI not laterally displaced GI- soft, NT, ND, + BS Extremities- no clubbing, cyanosis, or edema MS- no significant deformity or atrophy Skin- no rash or lesion Psych- euthymic mood, full affect Neuro- strength and sensation are intact  EKG-afib at 102 bpm, qrs int 98 ms, qtc 456 ms   Assessment and Plan: 1. Paroxysmal afib  Successful cardioversion but ERAF She is mildly symptomatic in afib and has the potential to worsen her HF I will increase amiodarone back  to 200 mg bid I  will bring back in 2 weeks and schedule anther DCCV Hopefully  this time, she will have enough amiodarone on board to hold her in rhythm  Continue eliquis 2.5 mg bid for a CHA2DS2VASc score of at least 5, reminded not to miss any doses  2. HTN Stable  3. CHF Weight stable Continue diuretic   Butch Penny C. Render Marley, [facility] [address] [place], [place] [address] [phone]

## 2018-08-22 NOTE — Patient Instructions (Signed)
Increase amiodarone to 200mg  twice a day until follow up.

## 2018-08-22 NOTE — Telephone Encounter (Signed)
Spoke with patient and advised results appt rescheduled

## 2018-08-23 ENCOUNTER — Ambulatory Visit (INDEPENDENT_AMBULATORY_CARE_PROVIDER_SITE_OTHER): Payer: Medicare Other | Admitting: *Deleted

## 2018-08-23 ENCOUNTER — Encounter (HOSPITAL_COMMUNITY): Payer: Self-pay | Admitting: Nurse Practitioner

## 2018-08-23 DIAGNOSIS — M8000XD Age-related osteoporosis with current pathological fracture, unspecified site, subsequent encounter for fracture with routine healing: Secondary | ICD-10-CM | POA: Diagnosis not present

## 2018-08-23 MED ORDER — DENOSUMAB 60 MG/ML ~~LOC~~ SOSY
60.0000 mg | PREFILLED_SYRINGE | Freq: Once | SUBCUTANEOUS | Status: AC
  Administered 2018-08-23: 60 mg via SUBCUTANEOUS

## 2018-08-24 ENCOUNTER — Ambulatory Visit (HOSPITAL_COMMUNITY): Payer: Medicare Other | Admitting: Nurse Practitioner

## 2018-08-24 ENCOUNTER — Other Ambulatory Visit: Payer: Self-pay | Admitting: Internal Medicine

## 2018-08-24 NOTE — Telephone Encounter (Signed)
Last filled in [place] on 07/24/2018. RX request sent to Gayland Curry, DO for [facility] database review and approval if necessary

## 2018-08-28 ENCOUNTER — Telehealth: Payer: Self-pay | Admitting: *Deleted

## 2018-08-28 ENCOUNTER — Other Ambulatory Visit (HOSPITAL_COMMUNITY): Payer: Self-pay | Admitting: *Deleted

## 2018-08-28 DIAGNOSIS — M545 Low back pain, unspecified: Secondary | ICD-10-CM

## 2018-08-28 MED ORDER — FENTANYL 12 MCG/HR TD PT72: 1.0000 | MEDICATED_PATCH | TRANSDERMAL | 0 refills | Status: DC

## 2018-08-28 NOTE — Telephone Encounter (Signed)
Pt calling asking if she can get a lower dose of Fentanyl patch? Pt wish she could get off the patch all together, but will try this lower dose first. Please advise

## 2018-08-28 NOTE — Progress Notes (Signed)
error 

## 2018-08-28 NOTE — Telephone Encounter (Signed)
She may try going to the fentanyl 68mcg instead--change every 3 days.

## 2018-09-04 DIAGNOSIS — H353112 Nonexudative age-related macular degeneration, right eye, intermediate dry stage: Secondary | ICD-10-CM | POA: Diagnosis not present

## 2018-09-04 DIAGNOSIS — H353211 Exudative age-related macular degeneration, right eye, with active choroidal neovascularization: Secondary | ICD-10-CM | POA: Diagnosis not present

## 2018-09-05 ENCOUNTER — Other Ambulatory Visit: Payer: Self-pay

## 2018-09-05 ENCOUNTER — Ambulatory Visit (HOSPITAL_COMMUNITY)
Admission: RE | Admit: 2018-09-05 | Discharge: 2018-09-05 | Disposition: A | Discharge status: Home / Self Care | Payer: Medicare Other | Source: Ambulatory Visit | Attending: Nurse Practitioner | Admitting: Nurse Practitioner

## 2018-09-05 ENCOUNTER — Encounter (HOSPITAL_COMMUNITY): Payer: Self-pay | Admitting: Nurse Practitioner

## 2018-09-05 VITALS — BP 134/76 | HR 99 | Ht 60.0 in | Wt 108.0 lb

## 2018-09-05 DIAGNOSIS — Z8249 Family history of ischemic heart disease and other diseases of the circulatory system: Secondary | ICD-10-CM | POA: Diagnosis not present

## 2018-09-05 DIAGNOSIS — Z7901 Long term (current) use of anticoagulants: Secondary | ICD-10-CM | POA: Diagnosis not present

## 2018-09-05 DIAGNOSIS — Z880 Allergy status to penicillin: Secondary | ICD-10-CM | POA: Diagnosis not present

## 2018-09-05 DIAGNOSIS — I5032 Chronic diastolic (congestive) heart failure: Secondary | ICD-10-CM | POA: Diagnosis not present

## 2018-09-05 DIAGNOSIS — I4819 Other persistent atrial fibrillation: Secondary | ICD-10-CM | POA: Diagnosis not present

## 2018-09-05 DIAGNOSIS — E039 Hypothyroidism, unspecified: Secondary | ICD-10-CM | POA: Insufficient documentation

## 2018-09-05 DIAGNOSIS — Z888 Allergy status to other drugs, medicaments and biological substances status: Secondary | ICD-10-CM | POA: Insufficient documentation

## 2018-09-05 DIAGNOSIS — I480 Paroxysmal atrial fibrillation: Secondary | ICD-10-CM | POA: Diagnosis not present

## 2018-09-05 DIAGNOSIS — Z79890 Hormone replacement therapy (postmenopausal): Secondary | ICD-10-CM | POA: Insufficient documentation

## 2018-09-05 DIAGNOSIS — Z87442 Personal history of urinary calculi: Secondary | ICD-10-CM | POA: Diagnosis not present

## 2018-09-05 DIAGNOSIS — G4700 Insomnia, unspecified: Secondary | ICD-10-CM | POA: Insufficient documentation

## 2018-09-05 DIAGNOSIS — Z79899 Other long term (current) drug therapy: Secondary | ICD-10-CM | POA: Diagnosis not present

## 2018-09-05 DIAGNOSIS — E7800 Pure hypercholesterolemia, unspecified: Secondary | ICD-10-CM | POA: Insufficient documentation

## 2018-09-05 DIAGNOSIS — I110 Hypertensive heart disease with heart failure: Secondary | ICD-10-CM | POA: Insufficient documentation

## 2018-09-05 DIAGNOSIS — Z87891 Personal history of nicotine dependence: Secondary | ICD-10-CM | POA: Diagnosis not present

## 2018-09-05 MED ORDER — AMIODARONE HCL 200 MG PO TABS: 200.0000 mg | ORAL_TABLET | Freq: Every day | ORAL | 3 refills | Status: DC

## 2018-09-05 NOTE — Progress Notes (Signed)
Primary Care Physician: Gayland Curry, DO Referring Physician: Dr. 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 female with a h/o paroxysmal atrial fibrillation, s/p ablation 08/2014, by Dr. Rayann Heman, being seen in the [place] clinic for recent successful cardioversion but ERAF. He was seen by Dr. Aundra Dubin early June and found to be in afib . He restarted  amiodarone at 200 mg bid x 2 weeks then 200 mg daily. She has been on this years ago but stopped as she was maintaining SR after ablation. . Today, she admits to being mildly short of breath with activities and some increased ankle edema. Her EKG shows afib at 102 bpm.   F/u in afib clinic, 7/28. I increased her amiodarone to 200 mg bid x 2 more weeks and today she returns in SR. She can't  tell a big difference other than her feet are not swelling as much any more. She is happy that she will not require another cardioversion.   Today, she denies symptoms of palpitations, chest pain, shortness of breath, orthopnea, PND, lower extremity edema, dizziness, presyncope, syncope, or neurologic sequela. The patient is tolerating medications without difficulties and is otherwise without complaint today.   Past Medical History:  Diagnosis Date  . Abnormal stress test    a. 11/2011 Ex MV: EF 80%, small, partially reversible anteroapical defect->mild ischemia vs attenuation-->med Rx.  [name] Kitchen Alopecia, unspecified   . Arthritis   . Chronic diastolic CHF (congestive heart failure) ([place])    a. 11/2013 Echo: EF 60-65%, no rwma, mild TR, PASP 26mmHg.  . Closed fracture of lower end of radius with ulna   . Deafness    Left, s/p multiple surgeries  . Essential hypertension   . Gouty arthropathy, unspecified   . History of kidney stones   . Hypothyroidism   . Insomnia, unspecified   . Macular degeneration    Left, s/p inj. tx  . Mitral valve disorders(424.0)   . Neck mass    right, w/u including MRI negative  . PAF (paroxysmal atrial fibrillation)  (HCC)    a. on amio (02/2012 mild obstruction on PFT's) and xarelto.  . Polycythemia vera(238.4)   . Pure hypercholesterolemia   . Senile osteoporosis    Reclast in the past  . Tricuspid valve disorders, specified as nonrheumatic    Past Surgical History:  Procedure Laterality Date  . BREAST BIOPSY  06/18/1998   left  . CARDIOVERSION N/A 07/15/2017   Procedure: CARDIOVERSION;  Surgeon: Larey Dresser, MD;  Location: [facility] ENDOSCOPY;  Service: Cardiovascular;  Laterality: N/A;  . CARDIOVERSION N/A 07/21/2018   Procedure: CARDIOVERSION;  Surgeon: Larey Dresser, MD;  Location: [facility] ENDOSCOPY;  Service: Cardiovascular;  Laterality: N/A;  . ELECTROPHYSIOLOGIC STUDY N/A 01/08/2016   Procedure: Atrial Fibrillation Ablation;  Surgeon: Thompson Grayer, MD;  Location: [place] CV LAB;  Service: Cardiovascular;  Laterality: N/A;  . FEMUR IM NAIL Right 10/08/2015   Procedure: INTRAMEDULLARY (IM) NAIL FEMORAL RIGHT;  Surgeon: Paralee Cancel, MD;  Location: [facility] ORS;  Service: Orthopedics;  Laterality: Right;  . MASS EXCISION Right 11/26/2016   Procedure: EXCISION RIGHT LATERAL NECK MASS;  Surgeon: Jerrell Belfast, MD;  Location: [place];  Service: ENT;  Laterality: Right;  . TEE WITHOUT CARDIOVERSION N/A 07/15/2017   Procedure: TRANSESOPHAGEAL ECHOCARDIOGRAM (TEE);  Surgeon: Larey Dresser, MD;  Location: [facility] ENDOSCOPY;  Service: Cardiovascular;  Laterality: N/A;  . TONSILLECTOMY    . TOTAL ABDOMINAL HYSTERECTOMY    .  TYMPANOPLASTY Bilateral     Current Outpatient Medications  Medication Sig Dispense Refill  . acetaminophen (TYLENOL) 325 MG tablet Take 2 tablets (650 mg total) by mouth every 4 (four) hours as needed for mild pain or moderate pain. Not to exceed 3g in 24h 120 tablet 0  . amiodarone (PACERONE) 200 MG tablet Take 1 tablet (200 mg total) by mouth daily. 90 tablet 3  . Cholecalciferol (VITAMIN D) 2000 units CAPS Take 2,000 Units by mouth daily.     [name] Kitchen docusate sodium (COLACE) 100 MG capsule Take  200 mg by mouth 2 (two) times daily.    [name] Kitchen ELIQUIS 2.5 MG TABS tablet TAKE 1 TABLET BY MOUTH TWICE DAILY. 60 tablet 1  . fentaNYL (DURAGESIC) 12 MCG/HR Place 1 patch onto the skin every 3 (three) days. 10 patch 0  . furosemide (LASIX) 80 MG tablet Take 1 tablet (80 mg total) by mouth 2 (two) times daily. 60 tablet 6  . gabapentin (NEURONTIN) 100 MG capsule TAKE THREE (3) CAPSULES AT BEDTIME. 90 capsule 0  . hydrALAZINE (APRESOLINE) 50 MG tablet TAKE 1 1/2 TABLETS THREE TIMES DAILY. (Patient taking differently: Take 75 mg by mouth 3 (three) times daily. ) 135 tablet 0  . hydroxyurea (HYDREA) 500 MG capsule 500 mg on Mondays, Wednesdays and Fridays and 1000 mg the other days of the week by mouth (Patient taking differently: Take 500-1,000 mg by mouth See admin instructions. 500 mg on Mondays, Wednesdays and Fridays and 1000 mg the other days of the week by mouth) 100 capsule 9  . levothyroxine (SYNTHROID) 100 MCG tablet TAKE 1 TABLET ONCE DAILY BEFORE BREAKFAST. 90 tablet 0  . Magnesium 250 MG TABS Take 250 mg by mouth daily.     . Melatonin 10 MG TABS Take 10 mg by mouth at bedtime.    . Multiple Vitamins-Minerals (PRESERVISION AREDS 2) CAPS Take 1 capsule by mouth 2 (two) times daily.     . potassium chloride (K-DUR) 10 MEQ tablet Take 10-20 mEq by mouth See admin instructions. 2 tablets in the morning. 1 tablet in the evening    . sodium chloride (OCEAN) 0.65 % SOLN nasal spray Place 2 sprays into both nostrils as needed for congestion.    . traMADol (ULTRAM) 50 MG tablet TAKE ONE TABLET AT BEDTIME. 30 tablet 0   No current facility-administered medications for this encounter.     Allergies  Allergen Reactions  . Amlodipine Swelling and Other (See Comments)    Reaction:  Lower extremity swelling   . Augmentin [Amoxicillin-Pot Clavulanate] Other (See Comments)    Stomach Pain/cramps    Social History   Socioeconomic History  . Marital status: Married    Spouse name: [name]  . Number of  children: 2  . Years of education: 65  . Highest education level: Not on file  Occupational History  . Not on file  Social Needs  . Financial resource strain: Not on file  . Food insecurity    Worry: Not on file    Inability: Not on file  . Transportation needs    Medical: Not on file    Non-medical: Not on file  Tobacco Use  . Smoking status: Former Smoker    Types: Cigarettes    Quit date: 01/19/1984    Years since quitting: 34.6  . Smokeless tobacco: Never Used  Substance and Sexual Activity  . Alcohol use: Yes    Alcohol/week: 1.0 standard drinks    Types: 1 Glasses of wine  per week    Comment: 2 per day/ 14 a week  . Drug use: No  . Sexual activity: Not Currently  Lifestyle  . Physical activity    Days per week: Not on file    Minutes per session: Not on file  . Stress: Not on file  Relationships  . Social Herbalist on phone: Not on file    Gets together: Not on file    Attends religious service: Not on file    Active member of club or organization: Not on file    Attends meetings of clubs or organizations: Not on file    Relationship status: Not on file  . Intimate partner violence    Fear of current or ex partner: Not on file    Emotionally abused: Not on file    Physically abused: Not on file    Forced sexual activity: Not on file  Other Topics Concern  . Not on file  Social History Narrative   Patient is Married 1955. 2 kids. 9 grandkid-12 years old in 2016. College graduate [name] [place]).    Lives in apartment,  Independent Living  section at [facility] since 02/2013.      Stay at home mother.       No Smoking history, Mod. alcohol use.   Patient has a living will, POA      Hobbies: CPU and painting          Family History  Problem Relation Age of Onset  . Hypertension Father   . Heart disease Father        patient does not know details.   . Ovarian cancer Mother 87  . Cancer Sister        colon  . Heart  disease Sister   . Cancer Sister   . Cancer Sister        hodgkin disease  . Breast cancer Daughter   . Cancer Daughter        breast    ROS- All systems are reviewed and negative except as per the HPI above  Physical Exam: Vitals:   09/05/18 1407  BP: 134/76  Pulse: 99  Weight: 49 kg  Height: 5' (1.524 m)   Wt Readings from Last 3 Encounters:  09/05/18 49 kg  08/22/18 50.8 kg  08/14/18 50.3 kg    Labs: Lab Results  Component Value Date   NA 138 08/07/2018   K 4.1 08/07/2018   CL 99 08/07/2018   CO2 32 08/07/2018   GLUCOSE 76 08/07/2018   BUN 24 (H) 08/07/2018   CREATININE 1.17 (H) 08/07/2018   CALCIUM 10.9 (H) 08/07/2018   Lab Results  Component Value Date   INR 1.12 07/07/2017   Lab Results  Component Value Date   CHOL 194 07/28/2017   HDL 72 (A) 07/28/2017   LDLCALC 102 07/28/2017   TRIG 99 07/28/2017     GEN- The patient is well appearing, alert and oriented x 3 today.   Head- normocephalic, atraumatic Eyes-  Sclera clear, conjunctiva pink Ears- hearing intact Oropharynx- clear Neck- supple, no JVP Lymph- no cervical lymphadenopathy Lungs- Clear to ausculation bilaterally, normal work of breathing Heart-  regular rate and rhythm, no murmurs, rubs or gallops, PMI not laterally displaced GI- soft, NT, ND, + BS Extremities- no clubbing, cyanosis, or edema MS- no significant deformity or atrophy Skin- no rash or lesion Psych- euthymic mood, full affect Neuro- strength and sensation are intact  EKG- NSR at 99 bpm, bpm, pr int 186 ms, qrs int 92 ms, qtc 508 ms   Assessment and Plan: 1. Paroxysmal afib  Successful cardioversion but ERAF She is mildly symptomatic in afib and has the potential to worsen her HF She has had return to SR with  increase of  amiodarone back  to 200 mg bid She can now reduce the dose of amiodarone back to 200 mg daily Hopefully  this time, she will have enough amiodarone on board to hold her in rhythm  Continue eliquis  2.5 mg bid for a CHA2DS2VASc score of at least 5, reminded not to miss any doses  2. HTN Stable  3. CHF Weight stable Continue diuretic   F/u with Dr. Mariea Clonts in August and Dr. Aundra Dubin early September [place] clinic as needed  Geroge Baseman. Kyona Chauncey, [facility] [address] [place], [employer] [address] [phone]

## 2018-09-05 NOTE — Patient Instructions (Signed)
Reduce amiodarone to 200 mg once a day.

## 2018-09-15 ENCOUNTER — Other Ambulatory Visit: Payer: Self-pay | Admitting: Hematology and Oncology

## 2018-09-19 ENCOUNTER — Other Ambulatory Visit (HOSPITAL_COMMUNITY): Payer: Self-pay | Admitting: Cardiology

## 2018-09-22 ENCOUNTER — Other Ambulatory Visit: Payer: Self-pay | Admitting: Internal Medicine

## 2018-09-22 NOTE — Telephone Encounter (Signed)
Love data base verified last refill 08/24/18, verified patient's pharmacy [facility].  Next appt 09/27/2018

## 2018-09-25 ENCOUNTER — Other Ambulatory Visit: Payer: Self-pay | Admitting: *Deleted

## 2018-09-25 DIAGNOSIS — M545 Low back pain, unspecified: Secondary | ICD-10-CM

## 2018-09-25 MED ORDER — FENTANYL 12 MCG/HR TD PT72: 1.0000 | MEDICATED_PATCH | TRANSDERMAL | 0 refills | Status: DC

## 2018-09-25 NOTE — Telephone Encounter (Signed)
Patient requested refill NCCSRS Database Verified LR: 08/28/2018 Pended Rx and sent to [facility] for approval (Dr. Mariea Clonts out of office)

## 2018-09-27 ENCOUNTER — Other Ambulatory Visit: Payer: Self-pay

## 2018-09-27 ENCOUNTER — Non-Acute Institutional Stay: Payer: Medicare Other | Admitting: Internal Medicine

## 2018-09-27 ENCOUNTER — Encounter: Payer: Self-pay | Admitting: Internal Medicine

## 2018-09-27 VITALS — BP 110/70 | HR 100 | Temp 98.3°F | Ht 60.0 in | Wt 109.0 lb

## 2018-09-27 DIAGNOSIS — H9113 Presbycusis, bilateral: Secondary | ICD-10-CM | POA: Diagnosis not present

## 2018-09-27 DIAGNOSIS — H9202 Otalgia, left ear: Secondary | ICD-10-CM | POA: Diagnosis not present

## 2018-09-27 DIAGNOSIS — M8000XD Age-related osteoporosis with current pathological fracture, unspecified site, subsequent encounter for fracture with routine healing: Secondary | ICD-10-CM | POA: Diagnosis not present

## 2018-09-27 DIAGNOSIS — R000 Tachycardia, unspecified: Secondary | ICD-10-CM | POA: Diagnosis not present

## 2018-09-27 DIAGNOSIS — E039 Hypothyroidism, unspecified: Secondary | ICD-10-CM

## 2018-09-27 DIAGNOSIS — M545 Low back pain, unspecified: Secondary | ICD-10-CM

## 2018-09-27 NOTE — Progress Notes (Signed)
Should get her a nursing visit with an ECG to look at her rhythm with persistent tachycardia.

## 2018-09-27 NOTE — H&P (View-Only) (Signed)
It is possible that her last ECG showed atypical flutter, not sinus. Need nursing visit to repeat.

## 2018-09-27 NOTE — Progress Notes (Signed)
It is possible that her last ECG showed atypical flutter, not sinus. Need nursing visit to repeat.

## 2018-09-27 NOTE — Progress Notes (Signed)
Location:  Occupational psychologist of Service:  Clinic (12)  Provider: Ethne Jeon L. Mariea Clonts, D.O., C.M.D.  Code Status: DNR Goals of Care:  Advanced Directives 07/21/2018  Does Patient Have a Medical Advance Directive? Yes  Type of Paramedic of [place];Living will  Does patient want to make changes to medical advance directive? -  Copy of [place] in Chart? -  Would patient like information on creating a medical advance directive? -  Pre-existing out of facility DNR order (yellow form or pink MOST form) -   Chief Complaint  Patient presents with  . Medical Management of Chronic Issues    29mth follow-up    HPI: Patient is a 85 y.o. female seen today for medical management of chronic diseases.    She is feeling ok.    Still every now and then, gets a pain above her eye.    The night before last, just for a very shorrt time, she had a couple of short pain in her left ear.  Happened again yesterday.  Has always had trouble with her ears.  She's had ear infections w/o symptoms.  She did not wear her hearing aids today.    Back is doing good lately.  She's on her lower dose of fentanyl.  She's at a tolerable pain level.    Was in afib again in mid July.  Was given a two week course of bid amiodarone.  Then she was back in NSR end of July though pulse still elevated.      Says that [name] is so confused.  She never knows what he'll say.  She and Manuela Schwartz are sometimes amused by what he's saying.  Manuela Schwartz was able to see him last week and again today.  He had said that he and his wife had not spoken in a couple of days.   They get to see each other every other week.    Past Medical History:  Diagnosis Date  . Abnormal stress test    a. 11/2011 Ex MV: EF 80%, small, partially reversible anteroapical defect->mild ischemia vs attenuation-->med Rx.  [name] Kitchen Alopecia, unspecified   . Arthritis   . Chronic diastolic CHF (congestive heart  failure) ([place])    a. 11/2013 Echo: EF 60-65%, no rwma, mild TR, PASP 33mmHg.  . Closed fracture of lower end of radius with ulna   . Deafness    Left, s/p multiple surgeries  . Essential hypertension   . Gouty arthropathy, unspecified   . History of kidney stones   . Hypothyroidism   . Insomnia, unspecified   . Macular degeneration    Left, s/p inj. tx  . Mitral valve disorders(424.0)   . Neck mass    right, w/u including MRI negative  . PAF (paroxysmal atrial fibrillation) (HCC)    a. on amio (02/2012 mild obstruction on PFT's) and xarelto.  . Polycythemia vera(238.4)   . Pure hypercholesterolemia   . Senile osteoporosis    Reclast in the past  . Tricuspid valve disorders, specified as nonrheumatic     Past Surgical History:  Procedure Laterality Date  . BREAST BIOPSY  06/18/1998   left  . CARDIOVERSION N/A 07/15/2017   Procedure: CARDIOVERSION;  Surgeon: Larey Dresser, MD;  Location: [facility] ENDOSCOPY;  Service: Cardiovascular;  Laterality: N/A;  . CARDIOVERSION N/A 07/21/2018   Procedure: CARDIOVERSION;  Surgeon: Larey Dresser, MD;  Location: [facility] ENDOSCOPY;  Service: Cardiovascular;  Laterality: N/A;  .  ELECTROPHYSIOLOGIC STUDY N/A 01/08/2016   Procedure: Atrial Fibrillation Ablation;  Surgeon: Thompson Grayer, MD;  Location: [place] CV LAB;  Service: Cardiovascular;  Laterality: N/A;  . FEMUR IM NAIL Right 10/08/2015   Procedure: INTRAMEDULLARY (IM) NAIL FEMORAL RIGHT;  Surgeon: Paralee Cancel, MD;  Location: [facility] ORS;  Service: Orthopedics;  Laterality: Right;  . MASS EXCISION Right 11/26/2016   Procedure: EXCISION RIGHT LATERAL NECK MASS;  Surgeon: Jerrell Belfast, MD;  Location: [place];  Service: ENT;  Laterality: Right;  . TEE WITHOUT CARDIOVERSION N/A 07/15/2017   Procedure: TRANSESOPHAGEAL ECHOCARDIOGRAM (TEE);  Surgeon: Larey Dresser, MD;  Location: [facility] ENDOSCOPY;  Service: Cardiovascular;  Laterality: N/A;  . TONSILLECTOMY    . TOTAL ABDOMINAL HYSTERECTOMY    .  TYMPANOPLASTY Bilateral     Allergies  Allergen Reactions  . Amlodipine Swelling and Other (See Comments)    Reaction:  Lower extremity swelling   . Augmentin [Amoxicillin-Pot Clavulanate] Other (See Comments)    Stomach Pain/cramps    Outpatient Encounter Medications as of 09/27/2018  Medication Sig  . acetaminophen (TYLENOL) 325 MG tablet Take 2 tablets (650 mg total) by mouth every 4 (four) hours as needed for mild pain or moderate pain. Not to exceed 3g in 24h  . amiodarone (PACERONE) 200 MG tablet Take 1 tablet (200 mg total) by mouth daily.  . Cholecalciferol (VITAMIN D) 2000 units CAPS Take 2,000 Units by mouth daily.   [name] Kitchen docusate sodium (COLACE) 100 MG capsule Take 200 mg by mouth 2 (two) times daily.  [name] Kitchen ELIQUIS 2.5 MG TABS tablet TAKE 1 TABLET BY MOUTH TWICE DAILY.  . fentaNYL (DURAGESIC) 12 MCG/HR Place 1 patch onto the skin every 3 (three) days.  . furosemide (LASIX) 80 MG tablet Take 1 tablet (80 mg total) by mouth 2 (two) times daily.  [name] Kitchen gabapentin (NEURONTIN) 100 MG capsule Take 100 mg by mouth at bedtime.  . hydrALAZINE (APRESOLINE) 50 MG tablet TAKE 1 1/2 TABLETS THREE TIMES DAILY.  . hydroxyurea (HYDREA) 500 MG capsule TAKE 1 CAPSULE ON MONDAY, WEDNESDAY,AND FRIDAY, AND 2 CAPSULES THE OTHER DAYS OF THE WEEK.  [name] Kitchen levothyroxine (SYNTHROID) 100 MCG tablet TAKE 1 TABLET ONCE DAILY BEFORE BREAKFAST.  . Magnesium 250 MG TABS Take 250 mg by mouth daily.   . Melatonin 10 MG TABS Take 10 mg by mouth at bedtime.  . Multiple Vitamins-Minerals (PRESERVISION AREDS 2) CAPS Take 1 capsule by mouth 2 (two) times daily.   . potassium chloride (K-DUR) 10 MEQ tablet TAKE 2 TABLETS IN THE MORNING AND 1 TABLET IN THE EVENING.  . sodium chloride (OCEAN) 0.65 % SOLN nasal spray Place 2 sprays into both nostrils as needed for congestion.  . traMADol (ULTRAM) 50 MG tablet TAKE ONE TABLET AT BEDTIME.  . [DISCONTINUED] gabapentin (NEURONTIN) 100 MG capsule TAKE THREE (3) CAPSULES AT BEDTIME.  .  [DISCONTINUED] potassium chloride (K-DUR) 10 MEQ tablet Take 10-20 mEq by mouth See admin instructions. 2 tablets in the morning. 1 tablet in the evening   No facility-administered encounter medications on file as of 09/27/2018.     Review of Systems:  Review of Systems  Constitutional: Negative for chills, fever and malaise/fatigue.  HENT: Positive for ear pain and hearing loss. Negative for congestion.   Respiratory: Negative for cough and shortness of breath.   Cardiovascular: Negative for chest pain, palpitations and leg swelling.  Gastrointestinal: Positive for constipation. Negative for abdominal pain, blood in stool, diarrhea and melena.  Genitourinary: Negative for dysuria.  Musculoskeletal:  Positive for back pain (doing better lately). Negative for falls.  Skin: Negative for itching and rash.  Neurological: Negative for dizziness and loss of consciousness.  Endo/Heme/Allergies: Bruises/bleeds easily.  Psychiatric/Behavioral: Positive for memory loss. Negative for depression. The patient is not nervous/anxious and does not have insomnia.        A small amount of memory loss--seems to mix things up occasionally when telling stories    Health Maintenance  Topic Date Due  . INFLUENZA VACCINE  09/09/2018  . TETANUS/TDAP  01/13/2027  . DEXA SCAN  Completed  . PNA vac Low Risk Adult  Completed    Physical Exam: Vitals:   09/27/18 1436  BP: 110/70  Pulse: 100  Temp: 98.3 F (36.8 C)  TempSrc: Oral  SpO2: 96%  Weight: 109 lb (49.4 kg)  Height: 5' (1.524 m)   Body mass index is 21.29 kg/m. Physical Exam Vitals signs reviewed.  Constitutional:      General: She is not in acute distress.    Appearance: Normal appearance. She is normal weight. She is not toxic-appearing.  HENT:     Head: Normocephalic and atraumatic.     Right Ear: Tympanic membrane, ear canal and external ear normal. There is no impacted cerumen.     Left Ear: Ear canal and external ear normal. There is  no impacted cerumen.     Ears:     Comments: Some cerumen bilaterally, but not obstructive; scarring of left TM, but no erythema or pain during exam Neck:     Musculoskeletal: Normal range of motion.  Cardiovascular:     Rate and Rhythm: Regular rhythm. Tachycardia present.     Pulses: Normal pulses.     Heart sounds: Normal heart sounds.  Pulmonary:     Effort: Pulmonary effort is normal.     Breath sounds: Normal breath sounds. No wheezing, rhonchi or rales.  Abdominal:     General: Bowel sounds are normal.  Musculoskeletal: Normal range of motion.     Comments: Uses lightweight rolling walker with skis  Skin:    General: Skin is warm and dry.     Capillary Refill: Capillary refill takes less than 2 seconds.  Neurological:     General: No focal deficit present.     Mental Status: She is alert and oriented to person, place, and time. Mental status is at baseline.  Psychiatric:        Mood and Affect: Mood normal.        Behavior: Behavior normal.     Labs reviewed: Basic Metabolic Panel: Recent Labs    07/12/18 1021 07/17/18 1302 08/07/18 1501  NA 141 140 138  K 3.7 3.9 4.1  CL 100 101 99  CO2 29 26 32  GLUCOSE 65* 96 76  BUN 28* 31* 24*  CREATININE 1.01* 0.91 1.17*  CALCIUM 10.8* 11.6* 10.9*   Liver Function Tests: No results for input(s): AST, ALT, ALKPHOS, BILITOT, PROT, ALBUMIN in the last 8760 hours. No results for input(s): LIPASE, AMYLASE in the last 8760 hours. No results for input(s): AMMONIA in the last 8760 hours. CBC: Recent Labs    11/17/17 1327 01/20/18 1545 04/14/18 1205 08/14/18 1305  WBC 7.7 7.6 7.1 7.1  NEUTROABS 5.5  --  5.1 5.2  HGB 12.2 12.0 11.6* 11.5*  HCT 37.2 37.7 35.9* 35.8*  MCV 111.7* 114.6* 114.3* 114.7*  PLT 458* 460* 410* 360   Lipid Panel: No results for input(s): CHOL, HDL, LDLCALC, TRIG, CHOLHDL, LDLDIRECT in the  last 8760 hours. No results found for: HGBA1C  Procedures since last visit: No results  found.  Assessment/Plan 1. Sinus tachycardia -notably keeps elevated heart rate even when back in sinus here the past several weeks -is on amiodarone ongoing and seen with similar HR in cardiology clinic 7/28 -no longer in afib and she is unaware of her high pulse -I'm just concerned with her running this high ongoing--will send my note to cardiology  2. Lumbar back pain -doing better lately -seems to be managing with less fentanyl and I don't get the impression she's hurting considerably to cause her tachycardia (does not technically meet criteria, but higher than preferable HR)  3. Age-related osteoporosis with current pathological fracture with routine healing, subsequent encounter -continues on prolia and vitamin D; has chronic familial hypercalcemia condition and we monitor her bmp which has not been worrisome  4. Left ear pain -no notable current evidence of infection  5. Presbycusis of both ears -not using hearing aids today so I did have to speak up considerably with our masks  6. Hypothyroidism, unspecified type -continue levothyroxine -will need tsh recheck with amiodarone use--not ordered today (discovered post-appt) Lab Results  Component Value Date   TSH 1.37 07/28/2017   Labs/tests ordered:  No new Next appt:  02/28/2019  Natane Heward L. Deny Chevez, D.O. [facility] [address], [place] [address] Cell Phone (Mon-Fri 8am-5pm):  [phone] On Call:  [phone] & follow prompts after 5pm & weekends Office Phone:  [phone] Office Fax:  [phone]

## 2018-09-28 ENCOUNTER — Telehealth (HOSPITAL_COMMUNITY): Payer: Self-pay

## 2018-09-28 ENCOUNTER — Encounter (HOSPITAL_COMMUNITY): Payer: Self-pay | Admitting: Adult Health

## 2018-09-28 ENCOUNTER — Ambulatory Visit (HOSPITAL_COMMUNITY)
Admission: RE | Admit: 2018-09-28 | Discharge: 2018-09-28 | Disposition: A | Discharge status: Home / Self Care | Payer: Medicare Other | Source: Ambulatory Visit | Attending: Internal Medicine | Admitting: Internal Medicine

## 2018-09-28 ENCOUNTER — Other Ambulatory Visit: Payer: Self-pay

## 2018-09-28 VITALS — BP 122/80 | HR 101

## 2018-09-28 DIAGNOSIS — I440 Atrioventricular block, first degree: Secondary | ICD-10-CM | POA: Insufficient documentation

## 2018-09-28 DIAGNOSIS — I4510 Unspecified right bundle-branch block: Secondary | ICD-10-CM | POA: Insufficient documentation

## 2018-09-28 DIAGNOSIS — R000 Tachycardia, unspecified: Secondary | ICD-10-CM | POA: Diagnosis not present

## 2018-09-28 DIAGNOSIS — I444 Left anterior fascicular block: Secondary | ICD-10-CM | POA: Diagnosis not present

## 2018-09-28 DIAGNOSIS — I471 Supraventricular tachycardia: Secondary | ICD-10-CM

## 2018-09-28 NOTE — H&P (View-Only) (Signed)
She in today for EKG.   Dr Aundra Dubin and Dr Haroldine Laws reviewed + EP. Possible aytpical a flutter.   Will need DC-CV. Will verify she has been taking eliquis 2.5 mg twice a day for a least 1 month.   We will contact and set up for next week.   Noelly Lasseigne NP-C  3:37 PM

## 2018-09-28 NOTE — Telephone Encounter (Signed)
Called patient as requested by physician to bring patient in for nursing visit. Pt requires an EKG to assess HR and rhythm.  Called patient and made her aware and she is amenable to plan.  She will come in this afternoon for an EKG.

## 2018-09-28 NOTE — Progress Notes (Signed)
She in today for EKG.   Dr Aundra Dubin and Dr Haroldine Laws reviewed + EP. Possible aytpical a flutter.   Will need DC-CV. Will verify she has been taking eliquis 2.5 mg twice a day for a least 1 month.   We will contact and set up for next week.   Amy Clegg NP-C  3:37 PM

## 2018-09-28 NOTE — Telephone Encounter (Signed)
-----   Message from Larey Dresser, MD sent at 09/27/2018  9:45 PM EDT -----   ----- Message ----- From: Gayland Curry, DO Sent: 09/27/2018   5:41 PM EDT To: Larey Dresser, MD

## 2018-09-28 NOTE — Telephone Encounter (Signed)
Called patient to discuss plan for DCCV for atrial flutter.  EKG reviewed again and per NP, pt is to arrange for DCCV next week with Dr. Aundra Dubin. LM on patient voicemail to call office to discuss.

## 2018-09-28 NOTE — Progress Notes (Signed)
Patient in office today for EKG per Dr Aundra Dubin.  EKG reviewed by NP Amy.  No changes noted as of now.  Will be further reviewed and update patient if necessary

## 2018-09-29 ENCOUNTER — Encounter (HOSPITAL_COMMUNITY): Payer: Medicare Other

## 2018-09-29 NOTE — Telephone Encounter (Signed)
Pt called back she is aware of DCCV on 8/28 covid screen on 8/25. Pt aware nothing to eat or drink after midnight before dccv, make sure to take eliquis, hold all other medication. Pt aware to arrive at main entrance of [facility] at 6:30am (per endo pt only needs to be here 1 hour early not 2). Pts daughter will drive her to and from procedure. Pt verbalized understanding.

## 2018-10-02 ENCOUNTER — Other Ambulatory Visit (HOSPITAL_COMMUNITY): Payer: Self-pay

## 2018-10-03 ENCOUNTER — Other Ambulatory Visit (HOSPITAL_COMMUNITY)
Admission: RE | Admit: 2018-10-03 | Discharge: 2018-10-03 | Disposition: A | Discharge status: Home / Self Care | Payer: Medicare Other | Source: Ambulatory Visit | Attending: Cardiology | Admitting: Cardiology

## 2018-10-03 DIAGNOSIS — Z01812 Encounter for preprocedural laboratory examination: Secondary | ICD-10-CM | POA: Insufficient documentation

## 2018-10-03 DIAGNOSIS — Z20828 Contact with and (suspected) exposure to other viral communicable diseases: Secondary | ICD-10-CM | POA: Insufficient documentation

## 2018-10-03 LAB — SARS CORONAVIRUS 2 (TAT 6-24 HRS): SARS Coronavirus 2: NEGATIVE

## 2018-10-05 NOTE — Anesthesia Preprocedure Evaluation (Signed)
Anesthesia Evaluation  Patient identified by MRN, date of birth, ID band Patient awake    Reviewed: Allergy & Precautions, NPO status , Patient's Chart, lab work & pertinent test results  Airway Mallampati: II  TM Distance: >3 FB Neck ROM: Full    Dental no notable dental hx.    Pulmonary neg pulmonary ROS, former smoker,    Pulmonary exam normal breath sounds clear to auscultation       Cardiovascular hypertension, Pt. on medications + CAD  Normal cardiovascular exam+ dysrhythmias Atrial Fibrillation  Rhythm:Regular Rate:Normal  Chronic diastolic CHF   [id number] Echo: EF 60-65%, no rwma, mild TR, PASP 44mmHg.    Neuro/Psych negative neurological ROS  negative psych ROS   GI/Hepatic negative GI ROS, Neg liver ROS,   Endo/Other  Hypothyroidism   Renal/GU negative Renal ROS  negative genitourinary   Musculoskeletal  (+) Arthritis , Osteoarthritis,    Abdominal   Peds negative pediatric ROS (+)  Hematology  (+) Blood dyscrasia, ,   Anesthesia Other Findings   Reproductive/Obstetrics negative OB ROS                             Anesthesia Physical  Anesthesia Plan  ASA: III  Anesthesia Plan: General   Post-op Pain Management:    Induction: Intravenous  PONV Risk Score and Plan: 0  Airway Management Planned: Mask  Additional Equipment:   Intra-op Plan:   Post-operative Plan:   Informed Consent: I have reviewed the patients History and Physical, chart, labs and discussed the procedure including the risks, benefits and alternatives for the proposed anesthesia with the patient or authorized representative who has indicated his/her understanding and acceptance.     Dental advisory given  Plan Discussed with: CRNA and Surgeon  Anesthesia Plan Comments:         Anesthesia Quick Evaluation

## 2018-10-05 NOTE — Progress Notes (Signed)
Spoke with patient, patient has quarantined without symptoms since covid 19 testing.  Aware to be NPO p MN, arrival time 0630 am.

## 2018-10-06 ENCOUNTER — Ambulatory Visit (HOSPITAL_COMMUNITY)
Admission: RE | Admit: 2018-10-06 | Discharge: 2018-10-06 | Disposition: A | Discharge status: Home / Self Care | Payer: Medicare Other | Attending: Cardiology | Admitting: Cardiology

## 2018-10-06 ENCOUNTER — Ambulatory Visit (HOSPITAL_COMMUNITY): Payer: Medicare Other | Admitting: Anesthesiology

## 2018-10-06 ENCOUNTER — Encounter (HOSPITAL_COMMUNITY): Payer: Self-pay | Admitting: *Deleted

## 2018-10-06 ENCOUNTER — Encounter (HOSPITAL_COMMUNITY)
Admission: RE | Disposition: A | Discharge status: Home / Self Care | Payer: Self-pay | Source: Home / Self Care | Attending: Cardiology

## 2018-10-06 ENCOUNTER — Other Ambulatory Visit: Payer: Self-pay

## 2018-10-06 DIAGNOSIS — I4892 Unspecified atrial flutter: Secondary | ICD-10-CM | POA: Diagnosis not present

## 2018-10-06 DIAGNOSIS — I110 Hypertensive heart disease with heart failure: Secondary | ICD-10-CM | POA: Diagnosis not present

## 2018-10-06 DIAGNOSIS — I484 Atypical atrial flutter: Secondary | ICD-10-CM | POA: Insufficient documentation

## 2018-10-06 DIAGNOSIS — I5032 Chronic diastolic (congestive) heart failure: Secondary | ICD-10-CM | POA: Insufficient documentation

## 2018-10-06 DIAGNOSIS — E039 Hypothyroidism, unspecified: Secondary | ICD-10-CM | POA: Diagnosis not present

## 2018-10-06 DIAGNOSIS — M109 Gout, unspecified: Secondary | ICD-10-CM | POA: Diagnosis not present

## 2018-10-06 DIAGNOSIS — E7800 Pure hypercholesterolemia, unspecified: Secondary | ICD-10-CM | POA: Insufficient documentation

## 2018-10-06 DIAGNOSIS — Z79899 Other long term (current) drug therapy: Secondary | ICD-10-CM | POA: Insufficient documentation

## 2018-10-06 DIAGNOSIS — Z87891 Personal history of nicotine dependence: Secondary | ICD-10-CM | POA: Insufficient documentation

## 2018-10-06 DIAGNOSIS — I480 Paroxysmal atrial fibrillation: Secondary | ICD-10-CM | POA: Insufficient documentation

## 2018-10-06 DIAGNOSIS — Z7901 Long term (current) use of anticoagulants: Secondary | ICD-10-CM | POA: Diagnosis not present

## 2018-10-06 DIAGNOSIS — I2510 Atherosclerotic heart disease of native coronary artery without angina pectoris: Secondary | ICD-10-CM | POA: Diagnosis not present

## 2018-10-06 DIAGNOSIS — Z79890 Hormone replacement therapy (postmenopausal): Secondary | ICD-10-CM | POA: Insufficient documentation

## 2018-10-06 DIAGNOSIS — M810 Age-related osteoporosis without current pathological fracture: Secondary | ICD-10-CM | POA: Insufficient documentation

## 2018-10-06 HISTORY — PX: CARDIOVERSION: SHX1299

## 2018-10-06 LAB — POCT I-STAT, CHEM 8
BUN: 43 mg/dL — ABNORMAL HIGH (ref 8–23)
Calcium, Ion: 1.35 mmol/L (ref 1.15–1.40)
Chloride: 98 mmol/L (ref 98–111)
Creatinine, Ser: 1.2 mg/dL — ABNORMAL HIGH (ref 0.44–1.00)
Glucose, Bld: 84 mg/dL (ref 70–99)
HCT: 41 % (ref 36.0–46.0)
Hemoglobin: 13.9 g/dL (ref 12.0–15.0)
Potassium: 3.9 mmol/L (ref 3.5–5.1)
Sodium: 141 mmol/L (ref 135–145)
TCO2: 34 mmol/L — ABNORMAL HIGH (ref 22–32)

## 2018-10-06 SURGERY — CARDIOVERSION
Anesthesia: General

## 2018-10-06 MED ORDER — LIDOCAINE 2% (20 MG/ML) 5 ML SYRINGE
INTRAMUSCULAR | Status: DC | PRN
  Administered 2018-10-06: 60 mg via INTRAVENOUS

## 2018-10-06 MED ORDER — PROPOFOL 10 MG/ML IV BOLUS
INTRAVENOUS | Status: DC | PRN
  Administered 2018-10-06: 40 mg via INTRAVENOUS

## 2018-10-06 MED ORDER — SODIUM CHLORIDE 0.9 % IV SOLN
INTRAVENOUS | Status: DC
  Administered 2018-10-06: 08:00:00 via INTRAVENOUS

## 2018-10-06 NOTE — Interval H&P Note (Signed)
History and Physical Interval Note:  10/06/2018 7:47 AM  Sarah Mays  has presented today for surgery, with the diagnosis of A-FLUTTER.  The various methods of treatment have been discussed with the patient and family. After consideration of risks, benefits and other options for treatment, the patient has consented to  Procedure(s): CARDIOVERSION (N/A) as a surgical intervention.  The patient's history has been reviewed, patient examined, no change in status, stable for surgery.  I have reviewed the patient's chart and labs.  Questions were answered to the patient's satisfaction.     Linh Johannes [employer]

## 2018-10-06 NOTE — Discharge Instructions (Signed)

## 2018-10-06 NOTE — Transfer of Care (Signed)
Immediate Anesthesia Transfer of Care Note  Patient: Sarah Mays  Procedure(s) Performed: CARDIOVERSION (N/A )  Patient Location: Endoscopy Unit  Anesthesia Type:General  Level of Consciousness: drowsy and responds to stimulation  Airway & Oxygen Therapy: Patient Spontanous Breathing  Post-op Assessment: Report given to RN and Post -op Vital signs reviewed and stable  Post vital signs: Reviewed and stable  Last Vitals:  Vitals Value Taken Time  BP 102/63 10/06/18 0746  Temp    Pulse 59 10/06/18 0748  Resp 18 10/06/18 0748  SpO2 99 % 10/06/18 0748    Last Pain:  Vitals:   10/06/18 0708  TempSrc: Oral  PainSc: 0-No pain         Complications: No apparent anesthesia complications

## 2018-10-06 NOTE — Anesthesia Procedure Notes (Signed)
Procedure Name: General with mask airway Date/Time: 10/06/2018 7:42 AM Performed by: Elayne Snare, CRNA Pre-anesthesia Checklist: Patient identified, Emergency Drugs available, Suction available and Patient being monitored Patient Re-evaluated:Patient Re-evaluated prior to induction Oxygen Delivery Method: Ambu bag Preoxygenation: Pre-oxygenation with 100% oxygen Induction Type: IV induction

## 2018-10-06 NOTE — Procedures (Signed)
Electrical Cardioversion Procedure Note TORANCE HEINLEN [id number] Nov 08, 1930  Procedure: Electrical Cardioversion Indications:  Atrial Flutter  Procedure Details Consent: Risks of procedure as well as the alternatives and risks of each were explained to the (patient/caregiver).  Consent for procedure obtained. Time Out: Verified patient identification, verified procedure, site/side was marked, verified correct patient position, special equipment/implants available, medications/allergies/relevent history reviewed, required imaging and test results available.  Performed  Patient placed on cardiac monitor, pulse oximetry, supplemental oxygen as necessary.  Sedation given: Propofol per anesthesiology. Pacer pads placed anterior and posterior chest.  Cardioverted 1 time(s).  Cardioverted at [place].  Evaluation Findings: Post procedure EKG shows: NSR Complications: None Patient did tolerate procedure well.   Loralie Champagne 10/06/2018, 7:48 AM

## 2018-10-06 NOTE — Interval H&P Note (Signed)
History and Physical Interval Note:  10/06/2018 7:47 AM  Sarah Mays  has presented today for surgery, with the diagnosis of A-FLUTTER.  The various methods of treatment have been discussed with the patient and family. After consideration of risks, benefits and other options for treatment, the patient has consented to  Procedure(s): CARDIOVERSION (N/A) as a surgical intervention.  The patient's history has been reviewed, patient examined, no change in status, stable for surgery.  I have reviewed the patient's chart and labs.  Questions were answered to the patient's satisfaction.     Delanie Tirrell [employer]

## 2018-10-06 NOTE — Anesthesia Postprocedure Evaluation (Signed)
Anesthesia Post Note  Patient: Toniah Wynkoop Bornhorst  Procedure(s) Performed: CARDIOVERSION (N/A )     Patient location during evaluation: PACU Anesthesia Type: General Level of consciousness: awake and alert Pain management: pain level controlled Vital Signs Assessment: post-procedure vital signs reviewed and stable Respiratory status: spontaneous breathing, nonlabored ventilation, respiratory function stable and patient connected to nasal cannula oxygen Cardiovascular status: blood pressure returned to baseline and stable Postop Assessment: no apparent nausea or vomiting Anesthetic complications: no    Last Vitals:  Vitals:   10/06/18 0805 10/06/18 0810  BP: (!) 107/55 108/69  Pulse: (!) 57 (!) 57  Resp: 16 12  Temp:    SpO2: 97% 96%    Last Pain:  Vitals:   10/06/18 0810  TempSrc:   PainSc: 0-No pain                 Caspar Favila

## 2018-10-08 ENCOUNTER — Encounter (HOSPITAL_COMMUNITY): Payer: Self-pay | Admitting: Cardiology

## 2018-10-10 DIAGNOSIS — Z20828 Contact with and (suspected) exposure to other viral communicable diseases: Secondary | ICD-10-CM | POA: Diagnosis not present

## 2018-10-13 ENCOUNTER — Ambulatory Visit (HOSPITAL_COMMUNITY)
Admission: RE | Admit: 2018-10-13 | Discharge: 2018-10-13 | Disposition: A | Discharge status: Home / Self Care | Payer: Medicare Other | Source: Ambulatory Visit | Attending: Cardiology | Admitting: Cardiology

## 2018-10-13 ENCOUNTER — Encounter (HOSPITAL_COMMUNITY): Payer: Self-pay | Admitting: Cardiology

## 2018-10-13 ENCOUNTER — Other Ambulatory Visit: Payer: Self-pay

## 2018-10-13 VITALS — BP 105/60 | HR 54 | Wt 112.6 lb

## 2018-10-13 DIAGNOSIS — E039 Hypothyroidism, unspecified: Secondary | ICD-10-CM | POA: Diagnosis not present

## 2018-10-13 DIAGNOSIS — I484 Atypical atrial flutter: Secondary | ICD-10-CM | POA: Diagnosis not present

## 2018-10-13 DIAGNOSIS — I480 Paroxysmal atrial fibrillation: Secondary | ICD-10-CM | POA: Diagnosis not present

## 2018-10-13 DIAGNOSIS — Z79890 Hormone replacement therapy (postmenopausal): Secondary | ICD-10-CM | POA: Insufficient documentation

## 2018-10-13 DIAGNOSIS — Z8249 Family history of ischemic heart disease and other diseases of the circulatory system: Secondary | ICD-10-CM | POA: Diagnosis not present

## 2018-10-13 DIAGNOSIS — Z79899 Other long term (current) drug therapy: Secondary | ICD-10-CM | POA: Insufficient documentation

## 2018-10-13 DIAGNOSIS — I213 ST elevation (STEMI) myocardial infarction of unspecified site: Secondary | ICD-10-CM | POA: Diagnosis not present

## 2018-10-13 DIAGNOSIS — Z8041 Family history of malignant neoplasm of ovary: Secondary | ICD-10-CM | POA: Diagnosis not present

## 2018-10-13 DIAGNOSIS — R9431 Abnormal electrocardiogram [ECG] [EKG]: Secondary | ICD-10-CM | POA: Insufficient documentation

## 2018-10-13 DIAGNOSIS — I110 Hypertensive heart disease with heart failure: Secondary | ICD-10-CM | POA: Insufficient documentation

## 2018-10-13 DIAGNOSIS — Z7901 Long term (current) use of anticoagulants: Secondary | ICD-10-CM | POA: Insufficient documentation

## 2018-10-13 DIAGNOSIS — Z888 Allergy status to other drugs, medicaments and biological substances status: Secondary | ICD-10-CM | POA: Diagnosis not present

## 2018-10-13 DIAGNOSIS — Z803 Family history of malignant neoplasm of breast: Secondary | ICD-10-CM | POA: Diagnosis not present

## 2018-10-13 DIAGNOSIS — I4510 Unspecified right bundle-branch block: Secondary | ICD-10-CM | POA: Diagnosis not present

## 2018-10-13 DIAGNOSIS — Z87891 Personal history of nicotine dependence: Secondary | ICD-10-CM | POA: Insufficient documentation

## 2018-10-13 DIAGNOSIS — I5032 Chronic diastolic (congestive) heart failure: Secondary | ICD-10-CM | POA: Diagnosis not present

## 2018-10-13 DIAGNOSIS — I5033 Acute on chronic diastolic (congestive) heart failure: Secondary | ICD-10-CM | POA: Diagnosis not present

## 2018-10-13 DIAGNOSIS — I340 Nonrheumatic mitral (valve) insufficiency: Secondary | ICD-10-CM | POA: Diagnosis not present

## 2018-10-13 DIAGNOSIS — Z881 Allergy status to other antibiotic agents status: Secondary | ICD-10-CM | POA: Diagnosis not present

## 2018-10-13 DIAGNOSIS — R001 Bradycardia, unspecified: Secondary | ICD-10-CM | POA: Insufficient documentation

## 2018-10-13 LAB — COMPREHENSIVE METABOLIC PANEL
ALT: 17 U/L (ref 0–44)
AST: 15 U/L (ref 15–41)
Albumin: 4.2 g/dL (ref 3.5–5.0)
Alkaline Phosphatase: 44 U/L (ref 38–126)
Anion gap: 9 (ref 5–15)
BUN: 24 mg/dL — ABNORMAL HIGH (ref 8–23)
CO2: 27 mmol/L (ref 22–32)
Calcium: 10.1 mg/dL (ref 8.9–10.3)
Chloride: 101 mmol/L (ref 98–111)
Creatinine, Ser: 1.17 mg/dL — ABNORMAL HIGH (ref 0.44–1.00)
GFR calc Af Amer: 49 mL/min — ABNORMAL LOW (ref >60)
GFR calc non Af Amer: 42 mL/min — ABNORMAL LOW (ref >60)
Glucose, Bld: 79 mg/dL (ref 70–99)
Potassium: 4.1 mmol/L (ref 3.5–5.1)
Sodium: 137 mmol/L (ref 135–145)
Total Bilirubin: 0.6 mg/dL (ref 0.3–1.2)
Total Protein: 6.4 g/dL — ABNORMAL LOW (ref 6.5–8.1)

## 2018-10-13 LAB — CBC
HCT: 37.1 % (ref 36.0–46.0)
Hemoglobin: 12.1 g/dL (ref 12.0–15.0)
MCH: 39 pg — ABNORMAL HIGH (ref 26.0–34.0)
MCHC: 32.6 g/dL (ref 30.0–36.0)
MCV: 119.7 fL — ABNORMAL HIGH (ref 80.0–100.0)
Platelets: 527 10*3/uL — ABNORMAL HIGH (ref 150–400)
RBC: 3.1 MIL/uL — ABNORMAL LOW (ref 3.87–5.11)
RDW: 16.3 % — ABNORMAL HIGH (ref 11.5–15.5)
WBC: 6.8 10*3/uL (ref 4.0–10.5)
nRBC: 0 % (ref 0.0–0.2)

## 2018-10-13 LAB — TSH: TSH: 3.622 u[IU]/mL (ref 0.350–4.500)

## 2018-10-13 LAB — MAGNESIUM: Magnesium: 2.4 mg/dL (ref 1.7–2.4)

## 2018-10-13 NOTE — Patient Instructions (Signed)
Labs today We will only contact you if something comes back abnormal or we need to make some changes. Otherwise no news is good news!  Your provider has recommended that  you wear a Zio Patch for 3 days.  This monitor will record your heart rhythm for our review.  IF you have any symptoms while wearing the monitor please press the button.  If you have any issues with the patch or you notice a red or orange light on it please call the company at [phone].  Once you remove the patch please mail it back to the company as soon as possible so we can get the results.  Your physician recommends that you schedule a follow-up appointment in: 3 months with Dr Aundra Dubin   At the [facility], you and your health needs are our priority. As part of our continuing mission to provide you with exceptional heart care, we have created designated Provider Care Teams. These Care Teams include your primary Cardiologist (physician) and Advanced Practice Providers (APPs- Physician Assistants and Nurse Practitioners) who all work together to provide you with the care you need, when you need it.   You may see any of the following providers on your designated Care Team at your next follow up: [name] Kitchen Dr Glori Bickers . Dr Loralie Champagne . Darrick Grinder, NP   Please be sure to bring in all your medications bottles to every appointment.

## 2018-10-13 NOTE — Progress Notes (Signed)
Zio patch placed onto patient.  All instructions and information reviewed with patient, they verbalize understanding with no questions. 

## 2018-10-15 ENCOUNTER — Other Ambulatory Visit: Payer: Self-pay | Admitting: Internal Medicine

## 2018-10-15 NOTE — Progress Notes (Signed)
Date:  10/15/2018   ID:  Sarah Mays, DOB 28-Apr-1930, MRN [id number]   Provider location: [facility] Type of Visit: Established patient   PCP:  Gayland Curry, DO  Cardiologist:  Loralie Champagne, MD   History of Present Illness: Sarah Mays is a 85 y.o. female with history of paroxysmal atrial fibrillation and chronic diastolic CHF who presents for followup of CHF and atrial fibrillation.  In 7/16, she was admitted with chest pain.  Cardiolite showed no ischemia or infarction.  Echo in 6/17 showed EF 60-65%.  She developed problems elevated HR when in atrial fibrillation but bradycardia when in NSR.  She underwent atrial fibrillation ablation in 11/17.  She is tolerating Xarelto without melena or BRBPR.  Dr. Rayann Heman had her stop amiodarone.    Echo in 11/18 showed EF 60-65%, PASP 47 mmHg.   TEE-guided DCCV back to NSR in 6/19.  TEE showed EF 55-60% with moderate-severe MR.   She developed multiple episodes of epistaxis with Xarelto use.  We have transitioned her to apixaban 2.5 mg bid and she has not had epistaxis since then.   Echo in 6/20 showed EF 60-65%, normal RV, mild-moderate MR.   She had recurrent atrial fibrillation with DCCV to NSR in 6/20.  She then had atypical flutter with DCCV to NSR in 8/20. Symptomatically worse when in atrial fibrillation/flutter.  She is in NSR today.   Today, she is in a junctional rhythm with rate 53.  No lightheadedness or falls.  She walks with a walker due to back pain.  No shortness of breath.  She fatigues easily. No chest pain.  No orthopnea/PND.   Labs (7/16): K 4, creatinine 0.94, calcium 10.8, LFTs normal Labs (9/16): K 4, creatinine 0.9, LDL 69, TSH normal, HCT 42.3 Labs (5/17): K 4.6, creatinine 0.94, HCT 41.5, LFTs normal. Labs (6/17): K 5, creatinine 1.04, LDL 42, HDL 101, TSH normal Labs (12/17): K 3.7, creatinine 0.76, hgb 11.2 Labs (4/18): K 4.6, creatinine 0.91 Labs (10/18): K 4.5, creatinine  0.62 Labs (12/18): hgb 13.5 Labs (1/19): K 4.3, creatinine 0.6 Labs (4/19): hgb 13.5 Labs (7/19): K 4.5, creatinine 0.5, hgb 12.1 Labs (11/19): K 3.7, creatinine 0.95 Labs (12/19): K 3.6, creatinine 0.9, hgb 11.6 Labs (6/20): K 3.7, creatinine 1.01 => 1.17  ECG (personally reviewed): junctional rhythm at 53, poor RWP, difficult study with baseline artifact, not sure QT interval is accurate.   PMH: 1. Atrial fibrillation/flutter: Diagnosed initially in 10/13.  Holter monitor in 11/13 showed atrial fibrillation with average rate 65.  - Atrial fibrillation ablation in 11/17.  - TEE-guided DCCV in 6/19.  - atrial fibrillation with DCCV to NSR in 6/20.  - atypical atrial flutter with DCCV to NSR in 8/20.  2. Chronic diastolic CHF: Echo ([id number]) with EF 65-70%, mild MR, moderate biatrial enlargement, moderate TR, PA systolic pressure 45 mmHg.   Echo (10/15): EF 60-65%.  Echo (7/16) with EF 60-65%, mild MR.  - Echo (6/17): EF 60-65%, PASP 41 mmHg.  - Echo (11/18): EF 60-65%, PASP 47 mmHg - TEE (6/19): EF 55-60%, mild LVH, mildly decreased RV systolic function, peak RV-RA gradient 40 mmHg, moderate-severe MR with bileaflet MVP, ERO 0.37 cm^2.  - Echo (6/20): EF 60-65%, normal RV, mild-moderate MR, severe biatrial enlargement.  3. ETT-Sestamibi (11/13): Exercised to stage II, small partially reversible anteroapical perfusion defect suggesting mild ischemia versus attenuation, EF 80%.  Cardiolite (7/16) with EF 64%, no ischemia or infarction.  4. HTN: Lower extremity swelling with amlodipine.  5. Polycythemia vera: Has had phlebotomy only once.  6. Hypothyroidism 7. Macular degeneration 8. TAH 1980 9. Familial hypocalciuric hypercalcemia 10. PFTs (amiodarone use) in 1/14 showed a mild obstructive defect.  11. Degenerative disc disease 12. Sick sinus syndrome: Has not required PPM.  13. Mitral regurgitation: TEE (6/19) with moderate-severe MR with bileaflet MVP, ERO 0.37 cm^2.  - Echo (6/20)  with mild-moderate MR.   Current Outpatient Medications  Medication Sig Dispense Refill  . amiodarone (PACERONE) 200 MG tablet Take 1 tablet (200 mg total) by mouth daily. 90 tablet 3  . Cholecalciferol (VITAMIN D) 2000 units CAPS Take 2,000 Units by mouth daily.     [name] Kitchen ELIQUIS 2.5 MG TABS tablet TAKE 1 TABLET BY MOUTH TWICE DAILY. (Patient taking differently: Take 2.5 mg by mouth 2 (two) times daily. ) 60 tablet 1  . fentaNYL (DURAGESIC) 12 MCG/HR Place 1 patch onto the skin every 3 (three) days. 10 patch 0  . furosemide (LASIX) 80 MG tablet Take 1 tablet (80 mg total) by mouth 2 (two) times daily. 60 tablet 6  . gabapentin (NEURONTIN) 100 MG capsule Take 100 mg by mouth at bedtime.    . hydrALAZINE (APRESOLINE) 50 MG tablet TAKE 1 1/2 TABLETS THREE TIMES DAILY. (Patient taking differently: Take 75 mg by mouth 3 (three) times daily. ) 135 tablet 0  . hydroxyurea (HYDREA) 500 MG capsule TAKE 1 CAPSULE ON MONDAY, WEDNESDAY,AND FRIDAY, AND 2 CAPSULES THE OTHER DAYS OF THE WEEK. (Patient taking differently: Take 500 mg by mouth See admin instructions. Take 500 mg daily on Mon, Wed, and Fri. Take 500 mg twice daily on Sun, Tues, Thur, and Sat) 100 capsule 0  . levothyroxine (SYNTHROID) 100 MCG tablet TAKE 1 TABLET ONCE DAILY BEFORE BREAKFAST. (Patient taking differently: Take 100 mcg by mouth daily before breakfast. ) 90 tablet 0  . Magnesium 250 MG TABS Take 250 mg by mouth daily.     . Melatonin 10 MG TABS Take 10 mg by mouth at bedtime.    . Multiple Vitamins-Minerals (PRESERVISION AREDS 2) CAPS Take 1 capsule by mouth 2 (two) times daily.     . potassium chloride (K-DUR) 10 MEQ tablet TAKE 2 TABLETS IN THE MORNING AND 1 TABLET IN THE EVENING. (Patient taking differently: Take 10-20 mEq by mouth See admin instructions. Take 20 meq in the morning and 10 meq in the evening) 90 tablet 0  . sennosides-docusate sodium (SENOKOT-S) 8.6-50 MG tablet Take 2 tablets by mouth 2 (two) times daily.    . sodium  chloride (OCEAN) 0.65 % SOLN nasal spray Place 2 sprays into both nostrils 2 (two) times daily.     . traMADol (ULTRAM) 50 MG tablet TAKE ONE TABLET AT BEDTIME. (Patient taking differently: Take 50 mg by mouth at bedtime. ) 30 tablet 0   No current facility-administered medications for this encounter.     Allergies:   Amlodipine and Augmentin [amoxicillin-pot clavulanate]   Social History:  The patient  reports that she quit smoking about 34 years ago. Her smoking use included cigarettes. She has never used smokeless tobacco. She reports current alcohol use of about 1.0 standard drinks of alcohol per week. She reports that she does not use drugs.   Family History:  The patient's family history includes Breast cancer in her daughter; Cancer in her daughter, sister, sister, and sister; Heart disease in her father and sister; Hypertension in her father; Ovarian cancer (age of onset:  88) in her mother.   ROS:  Please see the history of present illness.   All other systems are personally reviewed and negative.   Exam:   BP 105/60   Pulse (!) 54   Wt 51.1 kg (112 lb 9.6 oz)   SpO2 98%   BMI 21.99 kg/m   General: NAD Neck: JVP 8 cm, no thyromegaly or thyroid nodule.  Lungs: Clear to auscultation bilaterally with normal respiratory effort. CV: Nondisplaced PMI.  Heart regular S1/S2, no S3/S4, no murmur.  No peripheral edema.  No carotid bruit.  Normal pedal pulses.  Abdomen: Soft, nontender, no hepatosplenomegaly, no distention.  Skin: Intact without lesions or rashes.  Neurologic: Alert and oriented x 3.  Psych: Normal affect. Extremities: No clubbing or cyanosis.  HEENT: Normal.   Recent Labs: 10/13/2018: ALT 17; BUN 24; Creatinine, Ser 1.17; Hemoglobin 12.1; Magnesium 2.4; Platelets 527; Potassium 4.1; Sodium 137; TSH 3.622  Personally reviewed   Wt Readings from Last 3 Encounters:  10/13/18 51.1 kg (112 lb 9.6 oz)  10/06/18 50.8 kg (112 lb)  09/27/18 49.4 kg (109 lb)       ASSESSMENT AND PLAN:  1. Atrial fibrillation/flutter: First noted in 10/13.  Atrial fibrillation has triggered acute on chronic diastolic CHF in the past.  CHADSVASC score is 64 (age, female gender, HTN, CHF).  She is anticoagulated with apixaban 2.5 mg bid which is appropriate for her age/weight.  She had atrial fibrillation ablation in 11/17.  TEE-guided DCCV in 6/19.  Cardioversion in 6/20 for atrial fibrillation and again in 8/20 for atypical atrial flutter.  She is in NSR today.  She is more symptomatic (short of breath) when in atrial fibrillation.   - Continue amiodarone 200 mg daily.  CMET, TSH today.  She will need a regular eye exam.   - Continue apixaban 2.5 mg bid. CBC today.  2. Chronic diastolic CHF:  Echo was done in 6/20 with EF 60-65%, normal RV, mild-moderate MR.  She is not volume overloaded on exam. - Continue Lasix 80 mg bid. BMET today. .    - Continue KCl 20 qam/10 qpm.  3. HTN: BP reasonably controlled, continue current meds.     4. Bradycardia: Suspect she has a degree of sick sinus syndrome. This has been stable off nodal blocking agents.  She is in a junctional rhythm today, rate 50s.  No lightheadedness.  - Zio patch x 3 days to make sure there is no clinically significant bradycardia.  5. Mitral regurgitation: Moderate to severe MR in setting of bileaflet prolapse on TEE in 6/19. However, echo in 6/20 showed only mild-moderate MR.   Recommended follow-up:  3 months.   Signed, Loralie Champagne, MD  10/15/2018   [facility] [address] [facility] [place] [address] [phone] (office) [phone] (fax)

## 2018-10-17 DIAGNOSIS — H353222 Exudative age-related macular degeneration, left eye, with inactive choroidal neovascularization: Secondary | ICD-10-CM | POA: Diagnosis not present

## 2018-10-17 DIAGNOSIS — H353124 Nonexudative age-related macular degeneration, left eye, advanced atrophic with subfoveal involvement: Secondary | ICD-10-CM | POA: Diagnosis not present

## 2018-10-17 DIAGNOSIS — H353211 Exudative age-related macular degeneration, right eye, with active choroidal neovascularization: Secondary | ICD-10-CM | POA: Diagnosis not present

## 2018-10-18 ENCOUNTER — Other Ambulatory Visit (HOSPITAL_COMMUNITY): Payer: Self-pay | Admitting: Cardiology

## 2018-10-22 ENCOUNTER — Other Ambulatory Visit: Payer: Self-pay | Admitting: Internal Medicine

## 2018-10-22 ENCOUNTER — Other Ambulatory Visit (HOSPITAL_COMMUNITY): Payer: Self-pay | Admitting: Cardiology

## 2018-10-24 ENCOUNTER — Other Ambulatory Visit (HOSPITAL_COMMUNITY): Payer: Self-pay

## 2018-10-24 DIAGNOSIS — R001 Bradycardia, unspecified: Secondary | ICD-10-CM | POA: Diagnosis not present

## 2018-10-24 MED ORDER — APIXABAN 2.5 MG PO TABS: 2.5000 mg | ORAL_TABLET | Freq: Two times a day (BID) | ORAL | 3 refills | Status: DC

## 2018-10-25 ENCOUNTER — Other Ambulatory Visit: Payer: Self-pay | Admitting: *Deleted

## 2018-10-25 DIAGNOSIS — M545 Low back pain, unspecified: Secondary | ICD-10-CM

## 2018-10-25 MED ORDER — FENTANYL 12 MCG/HR TD PT72: 1.0000 | MEDICATED_PATCH | TRANSDERMAL | 0 refills | Status: DC

## 2018-10-25 NOTE — Telephone Encounter (Signed)
Patient requested refill [facility]CC[facility] Database Verified LR: 09/25/2018 Pended Rx and sent to Dr. Mariea Clonts for approval.

## 2018-11-04 ENCOUNTER — Other Ambulatory Visit (HOSPITAL_COMMUNITY): Payer: Self-pay | Admitting: Cardiology

## 2018-11-05 NOTE — Progress Notes (Signed)
Electrophysiology Office Note Date: 11/16/2018  ID:  Sarah Mays, DOB March 27, 1930, MRN [id number]  PCP: Gayland Curry, DO Primary Cardiologist: Aundra Dubin Electrophysiologist: Allred  CC: follow up  Sarah Mays is a 85 y.o. female seen today for Dr Rayann Heman.  She presents today for routine electrophysiology followup.  Since last being seen in our clinic, the patient reports doing reasonably well.  She has chronic exercise intolerance and shortness of breath with exertion. She denies chest pain, palpitations, PND, orthopnea, nausea, vomiting, dizziness, syncope, edema, weight gain, or early satiety.  Past Medical History:  Diagnosis Date  . Abnormal stress test    a. 11/2011 Ex MV: EF 80%, small, partially reversible anteroapical defect->mild ischemia vs attenuation-->med Rx.  [name] Kitchen Alopecia, unspecified   . Arthritis   . Chronic diastolic CHF (congestive heart failure) ([place])    a. 11/2013 Echo: EF 60-65%, no rwma, mild TR, PASP 64mmHg.  . Closed fracture of lower end of radius with ulna   . Deafness    Left, s/p multiple surgeries  . Essential hypertension   . Gouty arthropathy, unspecified   . History of kidney stones   . Hypothyroidism   . Insomnia, unspecified   . Macular degeneration    Left, s/p inj. tx  . Mitral valve disorders(424.0)   . Neck mass    right, w/u including MRI negative  . PAF (paroxysmal atrial fibrillation) ([facility])    a. on amio (02/2012 mild obstruction on PFT's) and xarelto.  . Polycythemia vera(238.4)   . Pure hypercholesterolemia   . Senile osteoporosis    Reclast in the past  . Tricuspid valve disorders, specified as nonrheumatic    Past Surgical History:  Procedure Laterality Date  . BREAST BIOPSY  06/18/1998   left  . CARDIOVERSION N/A 07/15/2017   Procedure: CARDIOVERSION;  Surgeon: Larey Dresser, MD;  Location: [facility] ENDOSCOPY;  Service: Cardiovascular;  Laterality: N/A;  . CARDIOVERSION N/A 07/21/2018   Procedure: CARDIOVERSION;   Surgeon: Larey Dresser, MD;  Location: [facility] ENDOSCOPY;  Service: Cardiovascular;  Laterality: N/A;  . CARDIOVERSION N/A 10/06/2018   Procedure: CARDIOVERSION;  Surgeon: Larey Dresser, MD;  Location: [facility] ENDOSCOPY;  Service: Cardiovascular;  Laterality: N/A;  . ELECTROPHYSIOLOGIC STUDY N/A 01/08/2016   Procedure: Atrial Fibrillation Ablation;  Surgeon: Thompson Grayer, MD;  Location: [place] CV LAB;  Service: Cardiovascular;  Laterality: N/A;  . FEMUR IM NAIL Right 10/08/2015   Procedure: INTRAMEDULLARY (IM) NAIL FEMORAL RIGHT;  Surgeon: Paralee Cancel, MD;  Location: [facility] ORS;  Service: Orthopedics;  Laterality: Right;  . MASS EXCISION Right 11/26/2016   Procedure: EXCISION RIGHT LATERAL NECK MASS;  Surgeon: Jerrell Belfast, MD;  Location: [place];  Service: ENT;  Laterality: Right;  . TEE WITHOUT CARDIOVERSION N/A 07/15/2017   Procedure: TRANSESOPHAGEAL ECHOCARDIOGRAM (TEE);  Surgeon: Larey Dresser, MD;  [facility] END[facility];  Service: Cardiovascular;  Laterality: N/A;  . TONSILLECTOMY    . TOTAL ABDOMINAL HYSTERECTOMY    . TYMPANOPLASTY Bilateral     Current Outpatient Medications  Medication Sig Dispense Refill  . amiodarone (PACERONE) 200 MG tablet Take 1 tablet (200 mg total) by mouth daily. 90 tablet 3  . apixaban (ELIQUIS) 2.5 MG TABS tablet Take 1 tablet (2.5 mg total) by mouth 2 (two) times daily. 180 tablet 3  . Cholecalciferol (VITAMIN D) 2000 units CAPS Take 2,000 Units by mouth daily.     . fentaNYL (DURAGESIC) 12 MCG/HR Place 1 patch onto the skin every 3 (  three) days. 10 patch 0  . furosemide (LASIX) 80 MG tablet Take 1 tablet (80 mg total) by mouth 2 (two) times daily. 60 tablet 6  . gabapentin (NEURONTIN) 100 MG capsule Take 100 mg by mouth daily.    . hydrALAZINE (APRESOLINE) 50 MG tablet TAKE 1 1/2 TABLETS THREE TIMES DAILY. 135 tablet 0  . hydroxyurea (HYDREA) 500 MG capsule TAKE 1 CAPSULE ON MONDAY, WEDNESDAY,AND FRIDAY, AND 2 CAPSULES THE OTHER DAYS OF THE WEEK. 100  capsule 0  . levothyroxine (SYNTHROID) 100 MCG tablet TAKE 1 TABLET ONCE DAILY BEFORE BREAKFAST. 90 tablet 0  . Magnesium 250 MG TABS Take 250 mg by mouth daily.     . Melatonin 10 MG TABS Take 10 mg by mouth at bedtime.    . Multiple Vitamins-Minerals (PRESERVISION AREDS 2) CAPS Take 1 capsule by mouth 2 (two) times daily.     . potassium chloride (K-DUR) 10 MEQ tablet TAKE 2 TABLETS IN THE MORNING AND 1 TABLET IN THE EVENING. 90 tablet 0  . sennosides-docusate sodium (SENOKOT-S) 8.6-50 MG tablet Take 2 tablets by mouth 2 (two) times daily.    . sodium chloride (OCEAN) 0.65 % SOLN nasal spray Place 2 sprays into both nostrils 2 (two) times daily.     . traMADol (ULTRAM) 50 MG tablet TAKE ONE TABLET AT BEDTIME. 30 tablet 0   No current facility-administered medications for this visit.     Allergies:   Amlodipine and Augmentin [amoxicillin-pot clavulanate]   Social History: Social History   Socioeconomic History  . Marital status: Married    Spouse name: [name]  . Number of children: 2  . Years of education: 20  . Highest education level: Not on file  Occupational History  . Not on file  Social Needs  . Financial resource strain: Not on file  . Food insecurity    Worry: Not on file    Inability: Not on file  . Transportation needs    Medical: Not on file    Non-medical: Not on file  Tobacco Use  . Smoking status: Former Smoker    Types: Cigarettes    Quit date: 01/19/1984    Years since quitting: 34.8  . Smokeless tobacco: Never Used  Substance and Sexual Activity  . Alcohol use: Yes    Alcohol/week: 1.0 standard drinks    Types: 1 Glasses of wine per week    Comment: 2 per day/ 14 a week  . Drug use: No  . Sexual activity: Not Currently  Lifestyle  . Physical activity    Days per week: Not on file    Minutes per session: Not on file  . Stress: Not on file  Relationships  . Social Herbalist on phone: Not on file    Gets together: Not on file    Attends  religious service: Not on file    Active member of club or organization: Not on file    Attends meetings of clubs or organizations: Not on file    Relationship status: Not on file  . Intimate partner violence    Fear of current or ex partner: Not on file    Emotionally abused: Not on file    Physically abused: Not on file    Forced sexual activity: Not on file  Other Topics Concern  . Not on file  Social History Narrative   Patient is Married 1955. 2 kids. 96 grandkid-34 years old in 2016. College graduate [name] [place]).  Lives in apartment,  Independent Living  section at [place] since 02/2013.      Stay at home mother.       No Smoking history, Mod. alcohol use.   Patient has a living will, POA      Hobbies: CPU and painting          Family History: Family History  Problem Relation Age of Onset  . Hypertension Father   . Heart disease Father        patient does not know details.   . Ovarian cancer Mother 71  . Cancer Sister        colon  . Heart disease Sister   . Cancer Sister   . Cancer Sister        hodgkin disease  . Breast cancer Daughter   . Cancer Daughter        breast    Review of Systems: All other systems reviewed and are otherwise negative except as noted above.   Physical Exam: VS:  BP 110/60   Pulse (!) 50   Ht 5' (1.524 m)   Wt 108 lb 9.6 oz (49.3 kg)   SpO2 95%   BMI 21.21 kg/m  , BMI Body mass index is 21.21 kg/m. Wt Readings from Last 3 Encounters:  11/06/18 108 lb 9.6 oz (49.3 kg)  10/13/18 112 lb 9.6 oz (51.1 kg)  10/06/18 112 lb (50.8 kg)    GEN- The patient is elderly and frail appearing, alert and oriented x 3 today.   HEENT: normocephalic, atraumatic; sclera clear, conjunctiva pink; hearing intact; oropharynx clear; neck supple Lungs- Clear to ausculation bilaterally, normal work of breathing.  No wheezes, rales, rhonchi Heart- Regular rate and rhythm GI- soft, non-tender, non-distended, bowel  sounds present Extremities- no clubbing, cyanosis, or edema MS- no significant deformity or atrophy Skin- warm and dry, no rash or lesion  Psych- euthymic mood, full affect Neuro- strength and sensation are intact   EKG:  EKG is not ordered today.  Recent Labs: 10/13/2018: ALT 17; BUN 24; Creatinine, Ser 1.17; Hemoglobin 12.1; Magnesium 2.4; Platelets 527; Potassium 4.1; Sodium 137; TSH 3.622    Other studies Reviewed: Additional studies/ records that were reviewed today include: Dr Rayann Heman and AHF notes   Assessment and Plan:  1. Paroxysmal atrial fibrillation Continue Eliquis for CHADS2VASC of 5 Dose adjusted Maintaining SR by symptoms Continue low dose amiodarone Recent holter monitor not yet read - will send message to HF clinic  2.  HTN Stable No change required today  3.  Chronic systolic heart failure Stable No change required today  4.  Junctional rhythm/bradycardia No symptoms of syncope/pre-syncope We reviewed warning signs today She would prefer a conservative approach Will review holter once available from AHF clinic    Current medicines are reviewed at length with the patient today.   The patient does not have concerns regarding her medicines.  The following changes were made today:  none  Labs/ tests ordered today include: none No orders of the defined types were placed in this encounter.    Disposition:   Follow up with Dr Rayann Heman in 1 year, AHF as scheduled   Signed, Chanetta Marshall, NP 11/16/2018 6:57 AM   [place] [address] [phone] (office) [phone] (fax)

## 2018-11-06 ENCOUNTER — Other Ambulatory Visit: Payer: Self-pay

## 2018-11-06 ENCOUNTER — Encounter: Payer: Self-pay | Admitting: Nurse Practitioner

## 2018-11-06 ENCOUNTER — Ambulatory Visit (INDEPENDENT_AMBULATORY_CARE_PROVIDER_SITE_OTHER): Payer: Medicare Other | Admitting: Nurse Practitioner

## 2018-11-06 VITALS — BP 110/60 | HR 50 | Ht 60.0 in | Wt 108.6 lb

## 2018-11-06 DIAGNOSIS — I10 Essential (primary) hypertension: Secondary | ICD-10-CM

## 2018-11-06 DIAGNOSIS — I5032 Chronic diastolic (congestive) heart failure: Secondary | ICD-10-CM

## 2018-11-06 DIAGNOSIS — I495 Sick sinus syndrome: Secondary | ICD-10-CM

## 2018-11-06 DIAGNOSIS — I480 Paroxysmal atrial fibrillation: Secondary | ICD-10-CM | POA: Diagnosis not present

## 2018-11-06 NOTE — Patient Instructions (Signed)
Medication Instructions:  none If you need a refill on your cardiac medications before your next appointment, please call your pharmacy.   Lab work: none If you have labs (blood work) drawn today and your tests are completely normal, you will receive your results only by: [name] Kitchen MyChart Message (if you have MyChart) OR . A paper copy in the mail If you have any lab test that is abnormal or we need to change your treatment, we will call you to review the results.  Testing/Procedures: none  Follow-Up: At [facility], you and your health needs are our priority.  As part of our continuing mission to provide you with exceptional heart care, we have created designated Provider Care Teams.  These Care Teams include your primary Cardiologist (physician) and Advanced Practice Providers (APPs -  Physician Assistants and Nurse Practitioners) who all work together to provide you with the care you need, when you need it. You will need a follow up appointment in 1 years.  Please call our office 2 months in advance to schedule this appointment.  You may see Dr Rayann Heman or one of the following Advanced Practice Providers on your designated Care Team:   Chanetta Marshall, NP . Tommye Standard, PA-C  Any Other Special Instructions Will Be Listed Below (If Applicable).

## 2018-11-07 NOTE — Addendum Note (Signed)
Encounter addended by: Micki Riley, RN on: 11/07/2018 11:01 AM  Actions taken: Imaging Exam ended

## 2018-11-15 ENCOUNTER — Encounter: Payer: Self-pay | Admitting: Family Medicine

## 2018-11-15 ENCOUNTER — Other Ambulatory Visit: Payer: Self-pay

## 2018-11-15 ENCOUNTER — Telehealth (HOSPITAL_COMMUNITY): Payer: Self-pay

## 2018-11-15 DIAGNOSIS — R29898 Other symptoms and signs involving the musculoskeletal system: Secondary | ICD-10-CM

## 2018-11-15 NOTE — Telephone Encounter (Signed)
-----   Message from Larey Dresser, MD sent at 11/12/2018 11:20 PM EDT ----- She has 51% atrial flutter though rate is generally controlled.  She had a 3.2 second pause but this was during the night and probably not clinically significant.

## 2018-11-15 NOTE — Telephone Encounter (Signed)
Pt aware of results and appreciative.  

## 2018-11-17 ENCOUNTER — Other Ambulatory Visit: Payer: Self-pay | Admitting: Hematology and Oncology

## 2018-11-17 ENCOUNTER — Other Ambulatory Visit (HOSPITAL_COMMUNITY): Payer: Self-pay | Admitting: Cardiology

## 2018-11-21 ENCOUNTER — Other Ambulatory Visit: Payer: Self-pay | Admitting: *Deleted

## 2018-11-21 DIAGNOSIS — M545 Low back pain, unspecified: Secondary | ICD-10-CM

## 2018-11-21 MED ORDER — TRAMADOL HCL 50 MG PO TABS: 50.0000 mg | ORAL_TABLET | Freq: Every day | ORAL | 0 refills | Status: DC

## 2018-11-23 DIAGNOSIS — Z23 Encounter for immunization: Secondary | ICD-10-CM | POA: Diagnosis not present

## 2018-11-24 ENCOUNTER — Other Ambulatory Visit: Payer: Self-pay | Admitting: *Deleted

## 2018-11-24 DIAGNOSIS — M545 Low back pain, unspecified: Secondary | ICD-10-CM

## 2018-11-24 MED ORDER — FENTANYL 12 MCG/HR TD PT72: 1.0000 | MEDICATED_PATCH | TRANSDERMAL | 0 refills | Status: DC

## 2018-11-24 NOTE — Telephone Encounter (Signed)
Patient requested refill [place] Database Verified LR: 10/25/2018 Pended Rx and sent to Dr. Mariea Clonts for approval.

## 2018-11-28 DIAGNOSIS — H353112 Nonexudative age-related macular degeneration, right eye, intermediate dry stage: Secondary | ICD-10-CM | POA: Diagnosis not present

## 2018-11-28 DIAGNOSIS — H353211 Exudative age-related macular degeneration, right eye, with active choroidal neovascularization: Secondary | ICD-10-CM | POA: Diagnosis not present

## 2018-11-28 DIAGNOSIS — H353222 Exudative age-related macular degeneration, left eye, with inactive choroidal neovascularization: Secondary | ICD-10-CM | POA: Diagnosis not present

## 2018-12-04 ENCOUNTER — Other Ambulatory Visit: Payer: Self-pay

## 2018-12-04 ENCOUNTER — Ambulatory Visit: Payer: Medicare Other | Attending: Physical Medicine & Rehabilitation

## 2018-12-04 DIAGNOSIS — M6281 Muscle weakness (generalized): Secondary | ICD-10-CM | POA: Diagnosis not present

## 2018-12-04 DIAGNOSIS — R2689 Other abnormalities of gait and mobility: Secondary | ICD-10-CM | POA: Diagnosis not present

## 2018-12-04 NOTE — Patient Instructions (Signed)
Access Code: [email or web address]  URL: [email or web address]  Date: 12/04/2018  Prepared by: Sigurd Sos   Exercises Seated Hamstring Stretch - 3 reps - 1 sets - 20 hold - 3x daily - 7x weekly Seated Long Arc Quad - 10 reps - 1 sets - 5 hold - 3x daily - 7x weekly Heel rises with counter support - 10 reps - 2 sets - 2x daily - 7x weekly

## 2018-12-04 NOTE — Therapy (Signed)
[facility] [address], [place], [place], [address] Phone: [phone]   Fax:  [phone]  Physical Therapy Evaluation  Patient Details  Name: Sarah Mays MRN: [id number] Date of Birth: [birth year] Referring Provider (PT): Charlann Boxer, MD   Encounter Date: 12/04/2018  PT End of Session - 12/04/18 1250    Visit Number  1    Date for PT Re-Evaluation  01/29/19    Authorization Type  Medicare    PT Start Time  1138    PT Stop Time  1221    PT Time Calculation (min)  43 min    Activity Tolerance  Patient tolerated treatment well    Behavior During Therapy  [facility] for tasks assessed/performed       Past Medical History:  Diagnosis Date  . Abnormal stress test    a. 11/2011 Ex MV: EF 80%, small, partially reversible anteroapical defect->mild ischemia vs attenuation-->med Rx.  [name] Kitchen Alopecia, unspecified   . Arthritis   . Chronic diastolic CHF (congestive heart failure) ([place])    a. 11/2013 Echo: EF 60-65%, no rwma, mild TR, PASP 38mmHg.  . Closed fracture of lower end of radius with ulna   . Deafness    Left, s/p multiple surgeries  . Essential hypertension   . Gouty arthropathy, unspecified   . History of kidney stones   . Hypothyroidism   . Insomnia, unspecified   . Macular degeneration    Left, s/p inj. tx  . Mitral valve disorders(424.0)   . Neck mass    right, w/u including MRI negative  . PAF (paroxysmal atrial fibrillation) (HCC)    a. on amio (02/2012 mild obstruction on PFT's) and xarelto.  . Polycythemia vera(238.4)   . Pure hypercholesterolemia   . Senile osteoporosis    Reclast in the past  . Tricuspid valve disorders, specified as nonrheumatic     Past Surgical History:  Procedure Laterality Date  . BREAST BIOPSY  06/18/1998   left  . CARDIOVERSION N/A 07/15/2017   Procedure: CARDIOVERSION;  Surgeon: Larey Dresser, MD;  Location: [facility] ENDOSCOPY;  Service: Cardiovascular;  Laterality: N/A;   . CARDIOVERSION N/A 07/21/2018   Procedure: CARDIOVERSION;  Surgeon: Larey Dresser, MD;  Location: [facility] ENDOSCOPY;  Service: Cardiovascular;  Laterality: N/A;  . CARDIOVERSION N/A 10/06/2018   Procedure: CARDIOVERSION;  Surgeon: Larey Dresser, MD;  Location: [facility] ENDOSCOPY;  Service: Cardiovascular;  Laterality: N/A;  . ELECTROPHYSIOLOGIC STUDY N/A 01/08/2016   Procedure: Atrial Fibrillation Ablation;  Surgeon: Thompson Grayer, MD;  Location: [facility] CV LAB;  Service: Cardiovascular;  Laterality: N/A;  . FEMUR IM NAIL Right 10/08/2015   Procedure: INTRAMEDULLARY (IM) NAIL FEMORAL RIGHT;  Surgeon: Paralee Cancel, MD;  Location: [facility] ORS;  Service: Orthopedics;  Laterality: Right;  . MASS EXCISION Right 11/26/2016   Procedure: EXCISION RIGHT LATERAL NECK MASS;  Surgeon: Jerrell Belfast, MD;  Location: [place];  Service: ENT;  Laterality: Right;  . TEE WITHOUT CARDIOVERSION N/A 07/15/2017   Procedure: TRANSESOPHAGEAL ECHOCARDIOGRAM (TEE);  Surgeon: Larey Dresser, MD;  Location: [facility] ENDOSCOPY;  Service: Cardiovascular;  Laterality: N/A;  . TONSILLECTOMY    . TOTAL ABDOMINAL HYSTERECTOMY    . TYMPANOPLASTY Bilateral     There were no vitals filed for this visit.   Subjective Assessment - 12/04/18 1142    Subjective  I want to get stronger because my daugther has a beach house and the steps killed my knees.  I was there a month  ago.    Pertinent History  osteoporosis, CHF, low back pain    Limitations  Standing;Walking    How long can you walk comfortably?  I get out of breath    Diagnostic tests  none    Patient Stated Goals  walk up/down the steps at my daugther's beach house    Currently in Pain?  Yes   Rt knee pain- with standing and walking up to 5/10   Pain Score  0-No pain         [facility] PT Assessment - 12/04/18 0001      Assessment   Medical Diagnosis  leg weakness, bilateral    Referring Provider (PT)  Charlann Boxer, MD    Onset Date/Surgical Date  12/03/16    Prior Therapy  not  for strength      Precautions   Precautions  Fall    Precaution Comments  osteoporosis      Restrictions   Weight Bearing Restrictions  No      Balance Screen   Has the patient fallen in the past 6 months  No    Has the patient had a decrease in activity level because of a fear of falling?   No    Is the patient reluctant to leave their home because of a fear of falling?   No      Home Environment   Living Environment  Assisted living   independent living   [place] - 2 wheels      Prior Function   Level of Independence  Independent    Vocation  Retired      Charity fundraiser Status  Within Functional Limits for tasks assessed      Posture/Postural Control   Posture/Postural Control  Postural limitations    Postural Limitations  Flexed trunk;Weight shift left      ROM / Strength   AROM / PROM / Strength  AROM;PROM;Strength      AROM   Overall AROM   Deficits    Overall AROM Comments  bil knee extension limited by 10 degrees as limited by hamstring length       PROM   Overall PROM   Deficits    Overall PROM Comments  hip flexibility limited by 25% bilaterally, knee extension limited by 10 degres      Strength   Overall Strength  Deficits    Strength Assessment Site  Knee;Hip    Right/Left Hip  Right;Left    Right/Left Knee  Right;Left      Palpation   Palpation comment  palpable tenderness over medial aspect of Rt knee       Transfers   Transfers  Sit to Stand;Stand to Sit    Sit to Stand  With upper extremity assist    Five time sit to stand comments   20.18 with uncontrolled descent    Stand to Sit  Without upper extremity assist      Ambulation/Gait   Ambulation/Gait  Yes    Assistive device  Rolling walker    Ambulation Surface  Level    Stairs  Yes    Stairs Assistance  5: Supervision    Stair Management Technique  Step to pattern;Two rails    Gait Comments  Rt knee hyperextension with gait                 Objective measurements completed on examination: See above findings.      [place]  Adult PT Treatment/Exercise - 12/04/18 0001      Exercises   Exercises  Knee/Hip;Lumbar      Lumbar Exercises: Aerobic   Nustep  Level 2x 4 minutes    seat 6- stopped due to fatigue and Rt knee pain            PT Education - 12/04/18 1209    Education Details  Access Code: [email or web address]    Person(s) Educated  Patient    Methods  Explanation;Demonstration;Handout    Comprehension  Verbalized understanding;Returned demonstration       PT Short Term Goals - 12/04/18 1138      PT SHORT TERM GOAL #1   Title  be independent in initial HEP    Time  4    Period  Weeks    Status  New    Target Date  01/01/19      PT SHORT TERM GOAL #2   Title  perform 5x sit to stand in < or = to 18 seconds without UE    Time  4    Period  Weeks    Status  New    Target Date  01/01/19      PT SHORT TERM GOAL #3   Title  improve LE strength to perform stand to sit with eccentric control without UE support    Time  4    Period  Weeks    Status  New    Target Date  01/01/19      PT SHORT TERM GOAL #4   Title  improve endurance to ride NuStep for 6-8 minutes without fatigue    Time  4    Period  Weeks    Status  New    Target Date  01/01/19        PT Long Term Goals - 12/04/18 1138      PT LONG TERM GOAL #1   Title  independent with advanced HEP for strength and endurance    Baseline  -    Time  8    Period  Weeks    Status  New    Target Date  01/29/19      PT LONG TERM GOAL #2   Title  perform 5x sit to stand in < or = to 15 seconds to improve balance    Baseline  --    Time  8    Period  Weeks    Status  New    Target Date  01/29/19      PT LONG TERM GOAL #3   Title  improve LE strength and endurance to walk for 5-8 minutes without fatigue    Baseline  --    Time  8    Period  Weeks    Status  New    Target Date  01/29/19      PT LONG TERM GOAL #4   Title   improve LE strength to negotiate steps with independence and use of 1 rail on the Lt to allow for visits to her daughter's house    Baseline  --    Time  8    Period  Weeks    Status  New    Target Date  01/29/19             Plan - 12/04/18 1236    Clinical Impression Statement  Pt presents to PT with chronic weakness and limited endurance needed for community ambulation.  Pt's daughter has a  beach house that she would like to visit and would like to improve her ability to negotiate steps with increased ease to improve safety to do so.  Pt demonstrates decreased LE strength, reduced hamstring length, Rt knee hyperextension with gait and uncontrolled descent with stand to sit transition.  Pt negotiates steps with step to step gait with use of 1 rail and supervision required for safety.  Pt reports Rt medial knee pain with standing and walking up to 5/10.  Pt performed 5x sit to stand in 20 seconds indicating a falls risk.  Pt will benefit from skilled PT for LE strength, flexibility and progression of endurance and mobility to improve safety and independence at home and in the community.    Personal Factors and Comorbidities  Age;Comorbidity 2    Comorbidities  osteoporosis, Rt knee OA, CHF    Examination-Activity Limitations  Stairs;Stand;Squat;Locomotion Level;Transfers    Examination-Participation Restrictions  Community Activity    Stability/Clinical Decision Making  Evolving/Moderate complexity    Clinical Decision Making  Moderate    Rehab Potential  Good    PT Frequency  2x / week    PT Duration  8 weeks    PT Treatment/Interventions  ADLs/Self Care Home Management;Cryotherapy;Electrical Stimulation;Ultrasound;Moist Heat;Gait training;Stair training;Functional mobility training;Therapeutic activities;Therapeutic exercise;Patient/family education;Neuromuscular re-education;Manual techniques;Passive range of motion;Taping    PT Next Visit Plan  hip and knee strength, functional  mobility, hamstring flexibility, gait, work on steps    Consulted and Agree with Plan of Care  Patient       Patient will benefit from skilled therapeutic intervention in order to improve the following deficits and impairments:  Abnormal gait, Decreased activity tolerance, Decreased mobility, Decreased range of motion, Difficulty walking, Hypomobility, Impaired flexibility, Improper body mechanics, Postural dysfunction, Pain  Visit Diagnosis: Muscle weakness (generalized) - Plan: PT plan of care cert/re-cert  Other abnormalities of gait and mobility - Plan: PT plan of care cert/re-cert     Problem List Patient Active Problem List   Diagnosis Date Noted  . Anemia in neoplastic disease 04/14/2018  . Neurogenic claudication due to lumbar spinal stenosis 01/28/2018  . Hydronephrosis with ureteropelvic junction (UPJ) obstruction 08/22/2017  . DDD (degenerative disc disease), cervical 08/09/2017  . Hypercoagulable state due to atrial fibrillation ([place]) 08/09/2017  . Presbycusis of both ears 08/03/2017  . Deficiency anemia 07/26/2017  . Recurrent epistaxis 02/25/2017  . Iron deficiency anemia 02/21/2017  . Pure hypercholesterolemia   . PONV (postoperative nausea and vomiting)   . PAF (paroxysmal atrial fibrillation) ([place])   . Neck mass   . Insomnia, unspecified   . History of kidney stones   . Deafness   . Closed fracture of lower end of radius with ulna   . Arthritis   . Abnormal stress test   . Rotator cuff arthropathy, right 09/08/2016  . Trigger point of right shoulder region 05/05/2016  . Spondylosis of cervical region without myelopathy or radiculopathy 05/05/2016  . Chondrocalcinosis 05/05/2016  . Chronic pain disorder 05/05/2016  . A-fib ([place]) 01/08/2016  . Closed fracture of shaft of right femur, initial encounter ([place]) 10/25/2015  . Status post-operative repair of hip fracture 10/25/2015  . Greater trochanteric bursitis of right hip 09/29/2015  . Asymptomatic  cholelithiasis 05/09/2015  . Essential hypertension 11/07/2014  . Hair loss 11/07/2014  . Bilateral leg edema 11/07/2014  . Constipation due to pain medication 11/06/2014  . Hypotension due to drugs 11/06/2014  . Chest pain 09/01/2014  . Osteoporosis 01/04/2014  . Gout 01/04/2014  .  Headache 01/04/2014  . Hypothyroidism 11/19/2013  . Hypercalcemia 10/11/2013  . Insomnia 05/16/2013  . Lumbar back pain 05/16/2013  . Polycythemia vera ([place])   . Mass of right side of neck   . Senile osteoporosis   . Macular degeneration   . Sick sinus syndrome ([place]) 03/09/2012  . CAD (coronary artery disease) 12/27/2011  . Paroxysmal atrial fibrillation ([place]) 12/27/2011  . Chronic diastolic CHF (congestive heart failure) ([place]) 12/27/2011  . HTN (hypertension) 12/27/2011     Sigurd Sos, PT 12/04/18 12:58 PM  [facility] [address], [place], [place], [address] Phone: [phone]   Fax:  [phone]  Name: BELLISSA TSCHIDA MRN: [id number] Date of Birth: 11/28/1930

## 2018-12-05 ENCOUNTER — Encounter: Payer: Self-pay | Admitting: Family

## 2018-12-05 ENCOUNTER — Ambulatory Visit (INDEPENDENT_AMBULATORY_CARE_PROVIDER_SITE_OTHER): Payer: Medicare Other | Admitting: Family

## 2018-12-05 DIAGNOSIS — Z0000 Encounter for general adult medical examination without abnormal findings: Secondary | ICD-10-CM

## 2018-12-05 NOTE — Progress Notes (Signed)
This service is provided via telemedicine  No vital signs collected/recorded due to the encounter was a telemedicine visit.   Location of patient (ex: home, work):  Home   Patient consents to a telephone visit:  Yes  Location of the provider (ex: office, home):  Office   Name of any referring provider: Hollace Kinnier, [place] of all persons participating in the telemedicine service and their role in the encounter:  Marlowe Sax, NP Ruthell Rummage CMA, and patient   Time spent on call:  Ruthell Rummage CMA spent 10 minutes on phone with patient.

## 2018-12-05 NOTE — Progress Notes (Signed)
Subjective:   Sarah Mays is a 85 y.o. female who presents for Medicare Annual (Subsequent) preventive examination.  Review of Systems:  Cardiac Risk Factors include: advanced age (>2men, >91 women);hypertension;smoking/ tobacco exposure;sedentary lifestyle     Objective:     Vitals: There were no vitals taken for this visit.  There is no height or weight on file to calculate BMI.  Advanced Directives 12/04/2018 07/21/2018 06/07/2018 03/29/2018 09/21/2017 09/07/2017 08/09/2017  Does Patient Have a Medical Advance Directive? Yes Yes Yes Yes Yes Yes Yes  Type of Paramedic of [place];Living will [place];Living will [place];Living will;Out of facility DNR (pink MOST or yellow form) Living will;Healthcare Power of [place];Living will;Out of facility DNR (pink MOST or yellow form) [place];Living will;Out of facility DNR (pink MOST or yellow form) [place];Living will;Out of facility DNR (pink MOST or yellow form)  Does patient want to make changes to medical advance directive? No - Patient declined - No - Patient declined Yes (Inpatient - patient requests chaplain consult to change a medical advance directive) No - Patient declined No - Patient declined No - Patient declined  Copy of [place] in Chart? No - copy requested - Yes - validated most recent copy scanned in chart (See row information) - Yes Yes Yes  Would patient like information on creating a medical advance directive? - - - - - - -  Pre-existing out of facility DNR order (yellow form or pink MOST form) - - Yellow form placed in chart (order not valid for inpatient use) - - Yellow form placed in chart (order not valid for inpatient use) Yellow form placed in chart (order not valid for inpatient use)    Tobacco Social History   Tobacco Use  Smoking Status Former Smoker  .  Types: Cigarettes  . Quit date: 01/19/1984  . Years since quitting: 34.9  Smokeless Tobacco Never Used     Counseling given: Not Answered   Clinical Intake:  Pre-visit preparation completed: No  Pain : No/denies pain     BMI - recorded: 21.29 Nutritional Status: BMI of 19-24  Normal Nutritional Risks: None Diabetes: No  How often do you need to have someone help you when you read instructions, pamphlets, or other written materials from your doctor or pharmacy?: 1 - Never What is the last grade level you completed in school?: Collge  Interpreter Needed?: No  Information entered by ::   FNP-C  Past Medical History:  Diagnosis Date  . Abnormal stress test    a. 11/2011 Ex MV: EF 80%, small, partially reversible anteroapical defect->mild ischemia vs attenuation-->med Rx.  [name] Kitchen Alopecia, unspecified   . Arthritis   . Chronic diastolic CHF (congestive heart failure) ([place])    a. 11/2013 Echo: EF 60-65%, no rwma, mild TR, PASP 31mmHg.  . Closed fracture of lower end of radius with ulna   . Deafness    Left, s/p multiple surgeries  . Essential hypertension   . Gouty arthropathy, unspecified   . History of kidney stones   . Hypothyroidism   . Insomnia, unspecified   . Macular degeneration    Left, s/p inj. tx  . Mitral valve disorders(424.0)   . Neck mass    right, w/u including MRI negative  . PAF (paroxysmal atrial fibrillation) (HCC)    a. on amio (02/2012 mild obstruction on PFT's) and xarelto.  . Polycythemia  vera(238.4)   . Pure hypercholesterolemia   . Senile osteoporosis    Reclast in the past  . Tricuspid valve disorders, specified as nonrheumatic    Past Surgical History:  Procedure Laterality Date  . BREAST BIOPSY  06/18/1998   left  . CARDIOVERSION N/A 07/15/2017   Procedure: CARDIOVERSION;  Surgeon: Larey Dresser, MD;  Location: [facility] ENDOSCOPY;  Service: Cardiovascular;  Laterality: N/A;  . CARDIOVERSION N/A 07/21/2018   Procedure:  CARDIOVERSION;  Surgeon: Larey Dresser, MD;  Location: [facility] ENDOSCOPY;  Service: Cardiovascular;  Laterality: N/A;  . CARDIOVERSION N/A 10/06/2018   Procedure: CARDIOVERSION;  Surgeon: Larey Dresser, MD;  Location: [facility] ENDOSCOPY;  Service: Cardiovascular;  Laterality: N/A;  . ELECTROPHYSIOLOGIC STUDY N/A 01/08/2016   Procedure: Atrial Fibrillation Ablation;  Surgeon: Thompson Grayer, MD;  Location: [place] CV LAB;  Service: Cardiovascular;  Laterality: N/A;  . FEMUR IM NAIL Right 10/08/2015   Procedure: INTRAMEDULLARY (IM) NAIL FEMORAL RIGHT;  Surgeon: Paralee Cancel, MD;  Location: [facility] ORS;  Service: Orthopedics;  Laterality: Right;  . MASS EXCISION Right 11/26/2016   Procedure: EXCISION RIGHT LATERAL NECK MASS;  Surgeon: Jerrell Belfast, MD;  Location: [place];  Service: ENT;  Laterality: Right;  . TEE WITHOUT CARDIOVERSION N/A 07/15/2017   Procedure: TRANSESOPHAGEAL ECHOCARDIOGRAM (TEE);  Surgeon: Larey Dresser, MD;  Location: [facility] ENDOSCOPY;  Service: Cardiovascular;  Laterality: N/A;  . TONSILLECTOMY    . TOTAL ABDOMINAL HYSTERECTOMY    . TYMPANOPLASTY Bilateral    Family History  Problem Relation Age of Onset  . Hypertension Father   . Heart disease Father        patient does not know details.   . Ovarian cancer Mother 24  . Cancer Sister        colon  . Heart disease Sister   . Cancer Sister   . Cancer Sister        hodgkin disease  . Breast cancer Daughter   . Cancer Daughter        breast   Social History   Socioeconomic History  . Marital status: Married    Spouse name: [name]  . Number of children: 2  . Years of education: 38  . Highest education level: Not on file  Occupational History  . Not on file  Social Needs  . Financial resource strain: Not on file  . Food insecurity    Worry: Not on file    Inability: Not on file  . Transportation needs    Medical: Not on file    Non-medical: Not on file  Tobacco Use  . Smoking status: Former Smoker    Types:  Cigarettes    Quit date: 01/19/1984    Years since quitting: 34.9  . Smokeless tobacco: Never Used  Substance and Sexual Activity  . Alcohol use: Not Currently    Alcohol/week: 1.0 standard drinks    Types: 1 Glasses of wine per week    Comment: 2 per day/ 14 a week  . Drug use: No  . Sexual activity: Not Currently  Lifestyle  . Physical activity    Days per week: Not on file    Minutes per session: Not on file  . Stress: Not on file  Relationships  . Social Herbalist on phone: Not on file    Gets together: Not on file    Attends religious service: Not on file    Active member of club or organization: Not on file  Attends meetings of clubs or organizations: Not on file    Relationship status: Not on file  Other Topics Concern  . Not on file  Social History Narrative   Patient is Married 1955. 2 kids. 70 grandkid-40 years old in 2016. College graduate [name] [place]).    Lives in apartment,  Independent Living  section at [place] since 02/2013.      Stay at home mother.       No Smoking history, Mod. alcohol use.   Patient has a living will, POA      Hobbies: CPU and painting          Outpatient Encounter Medications as of 12/05/2018  Medication Sig  . amiodarone (PACERONE) 200 MG tablet Take 1 tablet (200 mg total) by mouth daily.  [name] Kitchen apixaban (ELIQUIS) 2.5 MG TABS tablet Take 1 tablet (2.5 mg total) by mouth 2 (two) times daily.  . Cholecalciferol (VITAMIN D) 2000 units CAPS Take 2,000 Units by mouth daily.   . fentaNYL (DURAGESIC) 12 MCG/HR Place 1 patch onto the skin every 3 (three) days.  . furosemide (LASIX) 80 MG tablet Take 1 tablet (80 mg total) by mouth 2 (two) times daily.  [name] Kitchen gabapentin (NEURONTIN) 100 MG capsule Take 100 mg by mouth daily.  . hydrALAZINE (APRESOLINE) 50 MG tablet TAKE 1 1/2 TABLETS THREE TIMES DAILY.  . hydroxyurea (HYDREA) 500 MG capsule TAKE 1 CAPSULE ON MONDAY, WEDNESDAY,AND FRIDAY, AND 2 CAPSULES THE  OTHER DAYS OF THE WEEK.  [name] Kitchen levothyroxine (SYNTHROID) 100 MCG tablet TAKE 1 TABLET ONCE DAILY BEFORE BREAKFAST.  . Magnesium 250 MG TABS Take 250 mg by mouth daily.   . Melatonin 10 MG TABS Take 10 mg by mouth at bedtime.  . Multiple Vitamins-Minerals (PRESERVISION AREDS 2) CAPS Take 1 capsule by mouth 2 (two) times daily.   . potassium chloride (KLOR-CON) 10 MEQ tablet TAKE 2 TABLETS IN THE MORNING AND 1 TABLET IN THE EVENING.  . sennosides-docusate sodium (SENOKOT-S) 8.6-50 MG tablet Take 2 tablets by mouth 2 (two) times daily.  . sodium chloride (OCEAN) 0.65 % SOLN nasal spray Place 2 sprays into both nostrils 2 (two) times daily.   . traMADol (ULTRAM) 50 MG tablet Take 1 tablet (50 mg total) by mouth at bedtime.   No facility-administered encounter medications on file as of 12/05/2018.     Activities of Daily Living In your present state of health, do you have any difficulty performing the following activities: 12/05/2018  Hearing? Y  Comment wears hearing aids  Vision? Y  Comment MDR  Difficulty concentrating or making decisions? N  Walking or climbing stairs? Y  Comment limited due to knee  Dressing or bathing? N  Doing errands, shopping? N  Preparing Food and eating ? N  Comment food already prepared  Using the Toilet? N  In the past six months, have you accidently leaked urine? N  Do you have problems with loss of bowel control? N  Managing your Medications? N  Managing your Finances? Y  Comment son assist  Housekeeping or managing your Housekeeping? Y  Comment has assistance  Some recent data might be hidden    Patient Care Team: Gayland Curry, DO as PCP - General (Geriatric Medicine) Larey Dresser, MD as PCP - Cardiology (Cardiology) [facility], Clent Demark, MD as Consulting Physician (Ophthalmology) Larey Dresser, MD as Consulting Physician (Cardiology) Heath Lark, MD as Consulting Physician (Hematology and Oncology) Clent Jacks, MD  as Consulting Physician (Ophthalmology) Jerrell Belfast, MD as Consulting Physician (Otolaryngology)    Assessment:   This is a routine wellness examination for Sarah Mays.  Exercise Activities and Dietary recommendations Current Exercise Habits: The patient does not participate in regular exercise at present, Exercise limited by: Other - see comments(knee pain)  Goals    . Increase physical activity     Patient will increase exercise once back heals.       Fall Risk Fall Risk  12/05/2018 09/27/2018 06/07/2018 04/03/2018 11/30/2017  Falls in the past year? 0 0 0 0 Yes  Number falls in past yr: 0 0 0 0 1  Injury with Fall? 0 0 0 0 -  Risk for fall due to : - - - History of fall(s);Impaired balance/gait -  Follow up - - - Falls evaluation completed;Education provided;Falls prevention discussed;Follow up appointment -   Is the patient's home free of loose throw rugs in walkways, pet beds, electrical cords, etc?   no      Grab bars in the bathroom? yes      Handrails on the stairs?   no steps       Adequate lighting?   yes  Depression Screen PHQ 2/9 Scores 12/05/2018 09/27/2018 06/07/2018 04/03/2018  PHQ - 2 Score 0 0 0 0     Cognitive Function MMSE - Mini Mental State Exam 12/14/2016 08/06/2015  Orientation to time 5 4  Orientation to Place 5 5  Registration 3 3  Attention/ Calculation 5 5  Recall 3 3  Language- name 2 objects 2 2  Language- repeat 1 1  Language- follow 3 step command 3 3  Language- read & follow direction 1 1  Write a sentence 1 1  Copy design 1 1  Total score 30 29     6CIT Screen 12/05/2018  What Year? 0 points  What month? 0 points  What time? 0 points  Count back from 20 0 points  Months in reverse 2 points  Repeat phrase 2 points  Total Score 4    Immunization History  Administered Date(s) Administered  . Influenza Inj Mdck Quad Pf 11/27/2015  . Influenza, High Dose Seasonal PF 11/17/2018  . Influenza,inj,Quad PF,6+ Mos 10/11/2013  .  Influenza-Unspecified 11/08/2012, 11/28/2014, 11/29/2016  . Pneumococcal Conjugate-13 11/06/2014  . Pneumococcal Polysaccharide-23 10/18/2007  . Tdap 01/12/2017  . Zoster 06/27/2006  . Zoster Recombinat (Shingrix) 01/12/2017, 03/17/2017    Qualifies for Shingles Vaccine? Up to date   Screening Tests Health Maintenance  Topic Date Due  . TETANUS/TDAP  01/13/2027  . INFLUENZA VACCINE  Completed  . DEXA SCAN  Completed  . PNA vac Low Risk Adult  Completed    Cancer Screenings: Lung: Low Dose CT Chest recommended if Age 33-80 years, 30 pack-year currently smoking OR have quit w/in 15years. Patient does not qualify. Breast:  Up to date on Mammogram? No   Up to date of Bone Density/Dexa? No Colorectal: Age out   Additional Screenings: Hepatitis C Screening: Low Risk       Plan:   I have personally reviewed and noted the following in the patient's chart:   . Medical and social history . Use of alcohol, tobacco or illicit drugs  . Current medications and supplements . Functional ability and status . Nutritional status . Physical activity . Advanced directives . List of other physicians . Hospitalizations, surgeries, and ER visits in previous 12 months . Vitals . Screenings to include cognitive, depression, and falls . Referrals  and appointments  In addition, I have reviewed and discussed with patient certain preventive protocols, quality metrics, and best practice recommendations. A written personalized care plan for preventive services as well as general preventive health recommendations were provided to patient.  Sandrea Hughs, NP  12/05/2018

## 2018-12-05 NOTE — Patient Instructions (Signed)
Sarah Mays , Thank you for taking time to come for your Medicare Wellness Visit. I appreciate your ongoing commitment to your health goals. Please review the following plan we discussed and let me know if I can assist you in the future.   Screening recommendations/referrals: Colonoscopy: N/A  Mammogram: N/A  Bone Density: Up to date  Recommended yearly ophthalmology/optometry visit for glaucoma screening and checkup Recommended yearly dental visit for hygiene and checkup  Vaccinations: Influenza vaccine: Up to date  Pneumococcal vaccine : Up to date  Tdap vaccine : Up to date due 01/13/2027  Shingles vaccine : Up to date    Advanced directives: Yes   Conditions/risks identified: Advanced Age female > 83 yrs,Hypertension,sedentary lifestyle,Hx of smoking  Next appointment: 1 year    Preventive Care 83 Years and Older, Female Preventive care refers to lifestyle choices and visits with your health care provider that can promote health and wellness. What does preventive care include?  A yearly physical exam. This is also called an annual well check.  Dental exams once or twice a year.  Routine eye exams. Ask your health care provider how often you should have your eyes checked.  Personal lifestyle choices, including:  Daily care of your teeth and gums.  Regular physical activity.  Eating a healthy diet.  Avoiding tobacco and drug use.  Limiting alcohol use.  Practicing safe sex.  Taking low-dose aspirin every day.  Taking vitamin and mineral supplements as recommended by your health care provider. What happens during an annual well check? The services and screenings done by your health care provider during your annual well check will depend on your age, overall health, lifestyle risk factors, and family history of disease. Counseling  Your health care provider may ask you questions about your:  Alcohol use.  Tobacco use.  Drug use.  Emotional well-being.   Home and relationship well-being.  Sexual activity.  Eating habits.  History of falls.  Memory and ability to understand (cognition).  Work and work Statistician.  Reproductive health. Screening  You may have the following tests or measurements:  Height, weight, and BMI.  Blood pressure.  Lipid and cholesterol levels. These may be checked every 5 years, or more frequently if you are over 26 years old.  Skin check.  Lung cancer screening. You may have this screening every year starting at age 53 if you have a 30-pack-year history of smoking and currently smoke or have quit within the past 15 years.  Fecal occult blood test (FOBT) of the stool. You may have this test every year starting at age 10.  Flexible sigmoidoscopy or colonoscopy. You may have a sigmoidoscopy every 5 years or a colonoscopy every 10 years starting at age 66.  Hepatitis C blood test.  Hepatitis B blood test.  Sexually transmitted disease (STD) testing.  Diabetes screening. This is done by checking your blood sugar (glucose) after you have not eaten for a while (fasting). You may have this done every 1-3 years.  Bone density scan. This is done to screen for osteoporosis. You may have this done starting at age 53.  Mammogram. This may be done every 1-2 years. Talk to your health care provider about how often you should have regular mammograms. Talk with your health care provider about your test results, treatment options, and if necessary, the need for more tests. Vaccines  Your health care provider may recommend certain vaccines, such as:  Influenza vaccine. This is recommended every year.  Tetanus, diphtheria,  and acellular pertussis (Tdap, Td) vaccine. You may need a Td booster every 10 years.  Zoster vaccine. You may need this after age 50.  Pneumococcal 13-valent conjugate (PCV13) vaccine. One dose is recommended after age 24.  Pneumococcal polysaccharide (PPSV23) vaccine. One dose is  recommended after age 37. Talk to your health care provider about which screenings and vaccines you need and how often you need them. This information is not intended to replace advice given to you by your health care provider. Make sure you discuss any questions you have with your health care provider. Document Released: 02/21/2015 Document Revised: 10/15/2015 Document Reviewed: 11/26/2014 Elsevier Interactive Patient Education  2017 [employer] Prevention in the Home Falls can cause injuries. They can happen to people of all ages. There are many things you can do to make your home safe and to help prevent falls. What can I do on the outside of my home?  Regularly fix the edges of walkways and driveways and fix any cracks.  Remove anything that might make you trip as you walk through a door, such as a raised step or threshold.  Trim any bushes or trees on the path to your home.  Use bright outdoor lighting.  Clear any walking paths of anything that might make someone trip, such as rocks or tools.  Regularly check to see if handrails are loose or broken. Make sure that both sides of any steps have handrails.  Any raised decks and porches should have guardrails on the edges.  Have any leaves, snow, or ice cleared regularly.  Use sand or salt on walking paths during winter.  Clean up any spills in your garage right away. This includes oil or grease spills. What can I do in the bathroom?  Use night lights.  Install grab bars by the toilet and in the tub and shower. Do not use towel bars as grab bars.  Use non-skid mats or decals in the tub or shower.  If you need to sit down in the shower, use a plastic, non-slip stool.  Keep the floor dry. Clean up any water that spills on the floor as soon as it happens.  Remove soap buildup in the tub or shower regularly.  Attach bath mats securely with double-sided non-slip rug tape.  Do not have throw rugs and other things on  the floor that can make you trip. What can I do in the bedroom?  Use night lights.  Make sure that you have a light by your bed that is easy to reach.  Do not use any sheets or blankets that are too big for your bed. They should not hang down onto the floor.  Have a firm chair that has side arms. You can use this for support while you get dressed.  Do not have throw rugs and other things on the floor that can make you trip. What can I do in the kitchen?  Clean up any spills right away.  Avoid walking on wet floors.  Keep items that you use a lot in easy-to-reach places.  If you need to reach something above you, use a strong step stool that has a grab bar.  Keep electrical cords out of the way.  Do not use floor polish or wax that makes floors slippery. If you must use wax, use non-skid floor wax.  Do not have throw rugs and other things on the floor that can make you trip. What can I do with my  stairs?  Do not leave any items on the stairs.  Make sure that there are handrails on both sides of the stairs and use them. Fix handrails that are broken or loose. Make sure that handrails are as long as the stairways.  Check any carpeting to make sure that it is firmly attached to the stairs. Fix any carpet that is loose or worn.  Avoid having throw rugs at the top or bottom of the stairs. If you do have throw rugs, attach them to the floor with carpet tape.  Make sure that you have a light switch at the top of the stairs and the bottom of the stairs. If you do not have them, ask someone to add them for you. What else can I do to help prevent falls?  Wear shoes that:  Do not have high heels.  Have rubber bottoms.  Are comfortable and fit you well.  Are closed at the toe. Do not wear sandals.  If you use a stepladder:  Make sure that it is fully opened. Do not climb a closed stepladder.  Make sure that both sides of the stepladder are locked into place.  Ask someone to  hold it for you, if possible.  Clearly mark and make sure that you can see:  Any grab bars or handrails.  First and last steps.  Where the edge of each step is.  Use tools that help you move around (mobility aids) if they are needed. These include:  Canes.  Walkers.  Scooters.  Crutches.  Turn on the lights when you go into a dark area. Replace any light bulbs as soon as they burn out.  Set up your furniture so you have a clear path. Avoid moving your furniture around.  If any of your floors are uneven, fix them.  If there are any pets around you, be aware of where they are.  Review your medicines with your doctor. Some medicines can make you feel dizzy. This can increase your chance of falling. Ask your doctor what other things that you can do to help prevent falls. This information is not intended to replace advice given to you by your health care provider. Make sure you discuss any questions you have with your health care provider. Document Released: 11/21/2008 Document Revised: 07/03/2015 Document Reviewed: 03/01/2014 Elsevier Interactive Patient Education  2017 [employer].

## 2018-12-06 ENCOUNTER — Other Ambulatory Visit: Payer: Self-pay

## 2018-12-06 ENCOUNTER — Ambulatory Visit (INDEPENDENT_AMBULATORY_CARE_PROVIDER_SITE_OTHER): Payer: Medicare Other | Admitting: Family Medicine

## 2018-12-06 ENCOUNTER — Encounter: Payer: Self-pay | Admitting: Family Medicine

## 2018-12-06 ENCOUNTER — Ambulatory Visit: Payer: Self-pay

## 2018-12-06 VITALS — BP 144/70 | HR 41 | Ht 60.0 in | Wt 108.0 lb

## 2018-12-06 DIAGNOSIS — M7120 Synovial cyst of popliteal space [Baker], unspecified knee: Secondary | ICD-10-CM

## 2018-12-06 DIAGNOSIS — G8929 Other chronic pain: Secondary | ICD-10-CM

## 2018-12-06 DIAGNOSIS — M25562 Pain in left knee: Secondary | ICD-10-CM | POA: Diagnosis not present

## 2018-12-06 NOTE — Patient Instructions (Signed)
Good to see you  Drained both cysts in knee.  Should get a lot better Take the rest of the week off See me again in 4-8 weeks

## 2018-12-06 NOTE — Progress Notes (Signed)
[name] [address] [place], [place] [address] Phone: [phone] Subjective:   I Sarah Mays am serving as a Education administrator for Dr. Hulan Saas.  I'm seeing this patient by the request  of:    CC: Bilateral knee pain  [id number]  Sarah Mays is a 85 y.o. female coming in with complaint of bilateral knee pain. Left is doing a little better. Right knee is painful. Patient started with physical therapy and back at her nursing home, after 1 day of and had increasing discomfort and pain.    Past Medical History:  Diagnosis Date  . Abnormal stress test    a. 11/2011 Ex MV: EF 80%, small, partially reversible anteroapical defect->mild ischemia vs attenuation-->med Rx.  [name] Kitchen Alopecia, unspecified   . Arthritis   . Chronic diastolic CHF (congestive heart failure) ([place])    a. 11/2013 Echo: EF 60-65%, no rwma, mild TR, PASP 17mmHg.  . Closed fracture of lower end of radius with ulna   . Deafness    Left, s/p multiple surgeries  . Essential hypertension   . Gouty arthropathy, unspecified   . History of kidney stones   . Hypothyroidism   . Insomnia, unspecified   . Macular degeneration    Left, s/p inj. tx  . Mitral valve disorders(424.0)   . Neck mass    right, w/u including MRI negative  . PAF (paroxysmal atrial fibrillation) (HCC)    a. on amio (02/2012 mild obstruction on PFT's) and xarelto.  . Polycythemia vera(238.4)   . Pure hypercholesterolemia   . Senile osteoporosis    Reclast in the past  . Tricuspid valve disorders, specified as nonrheumatic    Past Surgical History:  Procedure Laterality Date  . BREAST BIOPSY  06/18/1998   left  . CARDIOVERSION N/A 07/15/2017   Procedure: CARDIOVERSION;  Surgeon: Larey Dresser, MD;  Location: [facility] ENDOSCOPY;  Service: Cardiovascular;  Laterality: N/A;  . CARDIOVERSION N/A 07/21/2018   Procedure: CARDIOVERSION;  Surgeon: Larey Dresser, MD;  Location: [facility] ENDOSCOPY;  Service: Cardiovascular;   Laterality: N/A;  . CARDIOVERSION N/A 10/06/2018   Procedure: CARDIOVERSION;  Surgeon: Larey Dresser, MD;  Location: [facility] ENDOSCOPY;  Service: Cardiovascular;  Laterality: N/A;  . ELECTROPHYSIOLOGIC STUDY N/A 01/08/2016   Procedure: Atrial Fibrillation Ablation;  Surgeon: Thompson Grayer, MD;  Location: [facility] CV LAB;  Service: Cardiovascular;  Laterality: N/A;  . FEMUR IM NAIL Right 10/08/2015   Procedure: INTRAMEDULLARY (IM) NAIL FEMORAL RIGHT;  Surgeon: Paralee Cancel, MD;  Location: [facility] ORS;  Service: Orthopedics;  Laterality: Right;  . MASS EXCISION Right 11/26/2016   Procedure: EXCISION RIGHT LATERAL NECK MASS;  Surgeon: Jerrell Belfast, MD;  Location: [facility];  Service: ENT;  Laterality: Right;  . TEE WITHOUT CARDIOVERSION N/A 07/15/2017   Procedure: TRANSESOPHAGEAL ECHOCARDIOGRAM (TEE);  Surgeon: Larey Dresser, MD;  Location: [facility] ENDOSCOPY;  Service: Cardiovascular;  Laterality: N/A;  . TONSILLECTOMY    . TOTAL ABDOMINAL HYSTERECTOMY    . TYMPANOPLASTY Bilateral    Social History   Socioeconomic History  . Marital status: Married    Spouse name: [name]  . Number of children: 2  . Years of education: 34  . Highest education level: Not on file  Occupational History  . Not on file  Social Needs  . Financial resource strain: Not on file  . Food insecurity    Worry: Not on file    Inability: Not on file  . Transportation needs  Medical: Not on file    Non-medical: Not on file  Tobacco Use  . Smoking status: Former Smoker    Types: Cigarettes    Quit date: 01/19/1984    Years since quitting: 34.9  . Smokeless tobacco: Never Used  Substance and Sexual Activity  . Alcohol use: Not Currently    Alcohol/week: 1.0 standard drinks    Types: 1 Glasses of wine per week    Comment: 2 per day/ 14 a week  . Drug use: No  . Sexual activity: Not Currently  Lifestyle  . Physical activity    Days per week: Not on file    Minutes per session: Not on file  . Stress: Not on file   Relationships  . Social Herbalist on phone: Not on file    Gets together: Not on file    Attends religious service: Not on file    Active member of club or organization: Not on file    Attends meetings of clubs or organizations: Not on file    Relationship status: Not on file  Other Topics Concern  . Not on file  Social History Narrative   Patient is Married 1955. 2 kids. 66 grandkid-73 years old in 2016. College graduate [name]).    Lives in apartment,  Independent Living  section at [facility] since 02/2013.      Stay at home mother.       No Smoking history, Mod. alcohol use.   Patient has a living will, POA      Hobbies: CPU and painting         Allergies  Allergen Reactions  . Amlodipine Swelling and Other (See Comments)    Lower extremity swelling   . Augmentin [Amoxicillin-Pot Clavulanate] Other (See Comments)    Stomach Pain/cramps Did it involve swelling of the face/tongue/throat, SOB, or low BP? No Did it involve sudden or severe rash/hives, skin peeling, or any reaction on the inside of your mouth or nose? No Did you need to seek medical attention at a hospital or doctor's office? Unknown When did it last happen?unknown If all above answers are "NO", may proceed with cephalosporin use.    Family History  Problem Relation Age of Onset  . Hypertension Father   . Heart disease Father        patient does not know details.   . Ovarian cancer Mother 62  . Cancer Sister        colon  . Heart disease Sister   . Cancer Sister   . Cancer Sister        hodgkin disease  . Breast cancer Daughter   . Cancer Daughter        breast    Current Outpatient Medications (Endocrine & Metabolic):  .  levothyroxine (SYNTHROID) 100 MCG tablet, TAKE 1 TABLET ONCE DAILY BEFORE BREAKFAST.  Current Outpatient Medications (Cardiovascular):  .  amiodarone (PACERONE) 200 MG tablet, Take 1 tablet (200 mg total) by mouth daily. .   furosemide (LASIX) 80 MG tablet, Take 1 tablet (80 mg total) by mouth 2 (two) times daily. .  hydrALAZINE (APRESOLINE) 50 MG tablet, TAKE 1 1/2 TABLETS THREE TIMES DAILY.  Current Outpatient Medications (Respiratory):  .  sodium chloride (OCEAN) 0.65 % SOLN nasal spray, Place 2 sprays into both nostrils 2 (two) times daily.   Current Outpatient Medications (Analgesics):  .  fentaNYL (DURAGESIC) 12 MCG/HR, Place 1 patch onto the skin every 3 (three)  days. .  traMADol (ULTRAM) 50 MG tablet, Take 1 tablet (50 mg total) by mouth at bedtime.  Current Outpatient Medications (Hematological):  .  apixaban (ELIQUIS) 2.5 MG TABS tablet, Take 1 tablet (2.5 mg total) by mouth 2 (two) times daily.  Current Outpatient Medications (Other):  [name] Kitchen  Cholecalciferol (VITAMIN D) 2000 units CAPS, Take 2,000 Units by mouth daily.  [name] Kitchen  gabapentin (NEURONTIN) 100 MG capsule, Take 100 mg by mouth daily. .  hydroxyurea (HYDREA) 500 MG capsule, TAKE 1 CAPSULE ON MONDAY, WEDNESDAY,AND FRIDAY, AND 2 CAPSULES THE OTHER DAYS OF THE WEEK. .  Magnesium 250 MG TABS, Take 250 mg by mouth daily.  .  Melatonin 10 MG TABS, Take 10 mg by mouth at bedtime. .  Multiple Vitamins-Minerals (PRESERVISION AREDS 2) CAPS, Take 1 capsule by mouth 2 (two) times daily.  .  potassium chloride (KLOR-CON) 10 MEQ tablet, TAKE 2 TABLETS IN THE MORNING AND 1 TABLET IN THE EVENING. .  sennosides-docusate sodium (SENOKOT-S) 8.6-50 MG tablet, Take 2 tablets by mouth 2 (two) times daily.    Past medical history, social, surgical and family history all reviewed in electronic medical record.  No pertanent information unless stated regarding to the chief complaint.   Review of Systems:  No headache, visual changes, nausea, vomiting, diarrhea, constipation, dizziness, abdominal pain, skin rash, fevers, chills, night sweats, weight loss, swollen lymph nodes, body aches, joint swelling, muscle aches, chest pain, shortness of breath, mood changes.    Objective  Blood pressure (!) 144/70, pulse (!) 41, height 5' (1.524 m), weight 108 lb (49 kg), SpO2 94 %.    General: No apparent distress alert and oriented x3 mood and affect normal, dressed appropriately.  HEENT: Pupils equal, extraocular movements intact  Respiratory: Patient's speak in full sentences and does not appear short of breath  Cardiovascular: No lower extremity edema, non tender, no erythema  Skin: Warm dry intact with no signs of infection or rash on extremities or on axial skeleton.  Abdomen: Soft nontender  Neuro: Cranial nerves II through XII are intact, neurovascularly intact in all extremities with 2+ DTRs and 2+ pulses.  Lymph: No lymphadenopathy of posterior or anterior cervical chain or axillae bilaterally.  Gait antalgic walking with the aid of a walker MSK:  tender with limited range of motion and good stability and symmetric strength and tone of shoulders, elbows, wrist, hip, and ankles bilaterally.  Severe arthritic changes of multiple joints Knee: valgus deformity noted.  No abnormal thigh to calf ratio.  Tender to palpation over medial and PF joint line.  Swelling noted in the popliteal area bilaterally ROM full in flexion and extension and lower leg rotation. instability with valgus force.  painful patellar compression. Patellar glide with moderate crepitus. Patellar and quadriceps tendons unremarkable. Hamstring and quadriceps strength is normal.   Procedure: Real-time Ultrasound Guided Injection of right knee Device: GE Logiq Q7 Ultrasound guided injection is preferred based studies that show increased duration, increased effect, greater accuracy, decreased procedural pain, increased response rate, and decreased cost with ultrasound guided versus blind injection.  Verbal informed consent obtained.  Time-out conducted.  Noted no overlying erythema, induration, or other signs of local infection.  Skin prepped in a sterile fashion.  Local anesthesia:  Topical Ethyl chloride.  With sterile technique and under real time ultrasound guidance: With a 22-gauge 2 inch needle patient was injected with 4 cc of 0.5% Marcaine and aspirated 10 cc of straw-colored fluid then injected 1 cc of Kenalog 40 mg/dL.  This was from a posterior approach approach.  Complex ganglion cyst noted Completed without difficulty  Pain immediately resolved suggesting accurate placement of the medication.  Advised to call if fevers/chills, erythema, induration, drainage, or persistent bleeding.  Images permanently stored and available for review in the ultrasound unit.  Impression: Technically successful ultrasound guided injection.  Procedure: Real-time Ultrasound Guided Injection of left knee Device: GE Logiq Q7 Ultrasound guided injection is preferred based studies that show increased duration, increased effect, greater accuracy, decreased procedural pain, increased response rate, and decreased cost with ultrasound guided versus blind injection.  Verbal informed consent obtained.  Time-out conducted.  Noted no overlying erythema, induration, or other signs of local infection.  Skin prepped in a sterile fashion.  Local anesthesia: Topical Ethyl chloride.  With sterile technique and under real time ultrasound guidance: With a 22-gauge 2 inch needle patient was injected with 4 cc of 0.5% Marcaine and aspirated 30 cc of straw-colored fluid 1 cc of Kenalog 40 mg/dL. This was from posterior approach.  Completed without difficulty  Pain immediately resolved suggesting accurate placement of the medication.  Advised to call if fevers/chills, erythema, induration, drainage, or persistent bleeding.  Images permanently stored and available for review in the ultrasound unit.  Impression: Technically successful ultrasound guided injection.   Impression and Recommendations:     This case required medical decision making of moderate complexity. The above documentation has been  reviewed and is accurate and complete Lyndal Pulley, DO       Note: This dictation was prepared with Dragon dictation along with smaller phrase technology. Any transcriptional errors that result from this process are unintentional.

## 2018-12-06 NOTE — Assessment & Plan Note (Signed)
Bilateral aspirations done today.  Patient does have underlying arthritic changes.  Discussed icing regimen and home exercise, patient will continue same medications, could be a candidate for viscosupplementation if needed.  Follow-up again in 4 to 8 weeks

## 2018-12-11 ENCOUNTER — Ambulatory Visit: Payer: Medicare Other

## 2018-12-14 ENCOUNTER — Telehealth: Payer: Self-pay | Admitting: Hematology and Oncology

## 2018-12-14 NOTE — Telephone Encounter (Signed)
Returned patient's phone call regarding rescheduling an appointment, per patient's request 11/06 appointment has been rescheduled to 11/13.

## 2018-12-15 ENCOUNTER — Other Ambulatory Visit: Payer: Medicare Other

## 2018-12-15 ENCOUNTER — Ambulatory Visit: Payer: Medicare Other | Admitting: Hematology and Oncology

## 2018-12-18 ENCOUNTER — Other Ambulatory Visit (HOSPITAL_COMMUNITY): Payer: Self-pay | Admitting: Cardiology

## 2018-12-18 ENCOUNTER — Other Ambulatory Visit: Payer: Self-pay

## 2018-12-18 ENCOUNTER — Ambulatory Visit: Payer: Medicare Other | Attending: Physical Medicine & Rehabilitation

## 2018-12-18 DIAGNOSIS — M6281 Muscle weakness (generalized): Secondary | ICD-10-CM | POA: Diagnosis not present

## 2018-12-18 DIAGNOSIS — G8929 Other chronic pain: Secondary | ICD-10-CM | POA: Diagnosis not present

## 2018-12-18 DIAGNOSIS — R2689 Other abnormalities of gait and mobility: Secondary | ICD-10-CM | POA: Insufficient documentation

## 2018-12-18 DIAGNOSIS — R252 Cramp and spasm: Secondary | ICD-10-CM | POA: Diagnosis not present

## 2018-12-18 DIAGNOSIS — M545 Low back pain: Secondary | ICD-10-CM | POA: Insufficient documentation

## 2018-12-18 DIAGNOSIS — H00012 Hordeolum externum right lower eyelid: Secondary | ICD-10-CM | POA: Diagnosis not present

## 2018-12-18 NOTE — Therapy (Signed)
[facility] [address] [place], [place], [address] Phone: [phone]   Fax:  [phone]  Physical Therapy Treatment  Patient Details  Name: Sarah Mays MRN: [id number] Date of Birth: 05-23-1930 Referring Provider (PT): Charlann Boxer, MD   Encounter Date: 12/18/2018  PT End of Session - 12/18/18 1337    Visit Number  2    Date for PT Re-Evaluation  01/29/19    Authorization Type  Medicare    PT Start Time  1152    PT Stop Time  1230    PT Time Calculation (min)  38 min    Activity Tolerance  Patient tolerated treatment well    Behavior During Therapy  [facility] for tasks assessed/performed       Past Medical History:  Diagnosis Date  . Abnormal stress test    a. 11/2011 Ex MV: EF 80%, small, partially reversible anteroapical defect->mild ischemia vs attenuation-->med Rx.  [name] Kitchen Alopecia, unspecified   . Arthritis   . Chronic diastolic CHF (congestive heart failure) ([place])    a. 11/2013 Echo: EF 60-65%, no rwma, mild TR, PASP 104mmHg.  . Closed fracture of lower end of radius with ulna   . Deafness    Left, s/p multiple surgeries  . Essential hypertension   . Gouty arthropathy, unspecified   . History of kidney stones   . Hypothyroidism   . Insomnia, unspecified   . Macular degeneration    Left, s/p inj. tx  . Mitral valve disorders(424.0)   . Neck mass    right, w/u including MRI negative  . PAF (paroxysmal atrial fibrillation) (HCC)    a. on amio (02/2012 mild obstruction on PFT's) and xarelto.  . Polycythemia vera(238.4)   . Pure hypercholesterolemia   . Senile osteoporosis    Reclast in the past  . Tricuspid valve disorders, specified as nonrheumatic     Past Surgical History:  Procedure Laterality Date  . BREAST BIOPSY  06/18/1998   left  . CARDIOVERSION N/A 07/15/2017   Procedure: CARDIOVERSION;  Surgeon: Larey Dresser, MD;  Location: [facility] ENDOSCOPY;  Service: Cardiovascular;  Laterality: N/A;  .  CARDIOVERSION N/A 07/21/2018   Procedure: CARDIOVERSION;  Surgeon: Larey Dresser, MD;  Location: [facility] ENDOSCOPY;  Service: Cardiovascular;  Laterality: N/A;  . CARDIOVERSION N/A 10/06/2018   Procedure: CARDIOVERSION;  Surgeon: Larey Dresser, MD;  Location: [facility] ENDOSCOPY;  Service: Cardiovascular;  Laterality: N/A;  . ELECTROPHYSIOLOGIC STUDY N/A 01/08/2016   Procedure: Atrial Fibrillation Ablation;  Surgeon: Thompson Grayer, MD;  Location: [facility] CV LAB;  Service: Cardiovascular;  Laterality: N/A;  . FEMUR IM NAIL Right 10/08/2015   Procedure: INTRAMEDULLARY (IM) NAIL FEMORAL RIGHT;  Surgeon: Paralee Cancel, MD;  Location: [facility] ORS;  Service: Orthopedics;  Laterality: Right;  . MASS EXCISION Right 11/26/2016   Procedure: EXCISION RIGHT LATERAL NECK MASS;  Surgeon: Jerrell Belfast, MD;  Location: [facility];  Service: ENT;  Laterality: Right;  . TEE WITHOUT CARDIOVERSION N/A 07/15/2017   Procedure: TRANSESOPHAGEAL ECHOCARDIOGRAM (TEE);  Surgeon: Larey Dresser, MD;  Location: [facility] ENDOSCOPY;  Service: Cardiovascular;  Laterality: N/A;  . TONSILLECTOMY    . TOTAL ABDOMINAL HYSTERECTOMY    . TYMPANOPLASTY Bilateral     There were no vitals filed for this visit.                    [facility] Adult PT Treatment/Exercise - 12/18/18 0001      Lumbar Exercises: Aerobic  Nustep  Level 2x 2 minutes stopped due to pain. Pt is unable to tolerate this at this time.    seat 6- stopped due to fatigue and Rt knee pain     Lumbar Exercises: Standing   Heel Raises  20 reps    Heel Raises Limitations  Bil UE support on rolling walker for heel raises due to balance deficits.     Other Standing Lumbar Exercises  Attempted sit<>stand with significant R knee valgus that pt reported was painful with correction. Performed 10x mini squat with UE support on rolling walker stopping movement before increased pain at the R knee and close stand by assist.       Lumbar Exercises: Seated   Long Arc Quad on Chair   2 sets;10 reps;Strengthening;Right;Left    LAQ on Chair Weights (lbs)  1    LAQ on Chair Limitations  VC for extending knee pain-free with demonstration for correct movement.     Other Seated Lumbar Exercises  hip abdct with green theraband 20x with VC to keep feet together.       Lumbar Exercises: Supine   Straight Leg Raise  10 reps    Straight Leg Raises Limitations  Bil LE 10 reps following quad set with tactile cueing using towel for quad contraction minimal ROM due to weakness.     Other Supine Lumbar Exercises  Quad set 10x 5 seconds with towel for tactile input for correct muscle contraction.       Knee/Hip Exercises: Seated   Abduction/Adduction   Both;1 set;20 reps    Abd/Adduction Limitations  Green theraband VC and demonstration for correct movement and to keep feet together in order to prevent compensation.              PT Education - 12/18/18 1337    Education Details  Pt will continue with current HEP. She will watch her R knee and try to keep it from falling in.    Person(s) Educated  Patient    Methods  Explanation;Demonstration;Tactile cues;Verbal cues    Comprehension  Verbalized understanding       PT Short Term Goals - 12/04/18 1138      PT SHORT TERM GOAL #1   Title  be independent in initial HEP    Time  4    Period  Weeks    Status  New    Target Date  01/01/19      PT SHORT TERM GOAL #2   Title  perform 5x sit to stand in < or = to 18 seconds without UE    Time  4    Period  Weeks    Status  New    Target Date  01/01/19      PT SHORT TERM GOAL #3   Title  improve LE strength to perform stand to sit with eccentric control without UE support    Time  4    Period  Weeks    Status  New    Target Date  01/01/19      PT SHORT TERM GOAL #4   Title  improve endurance to ride NuStep for 6-8 minutes without fatigue    Time  4    Period  Weeks    Status  New    Target Date  01/01/19        PT Long Term Goals - 12/04/18 1138      PT LONG  TERM GOAL #1   Title  independent  with advanced HEP for strength and endurance    Baseline  -    Time  8    Period  Weeks    Status  New    Target Date  01/29/19      PT LONG TERM GOAL #2   Title  perform 5x sit to stand in < or = to 15 seconds to improve balance    Baseline  --    Time  8    Period  Weeks    Status  New    Target Date  01/29/19      PT LONG TERM GOAL #3   Title  improve LE strength and endurance to walk for 5-8 minutes without fatigue    Baseline  --    Time  8    Period  Weeks    Status  New    Target Date  01/29/19      PT LONG TERM GOAL #4   Title  improve LE strength to negotiate steps with independence and use of 1 rail on the Lt to allow for visits to her daughter's house    Baseline  --    Time  8    Period  Weeks    Status  New    Target Date  01/29/19            Plan - 12/18/18 1338    Clinical Impression Statement  Pt requires extra time to perform activities due to stiffness and pain in Bil knees. She demonstrates significant weakness in the Bil quads as demonstrated by very limited mobiltiy with SLR and pt is unable to perform sit to stand with Bil UE support when forced to activate quads with physical therapist holding Bil knees out from touching during sit<>stand. Pt was able to perform mini-squats with very limited ROM pain-free. Pt has significant weakness in her Bil quads and hip abductors that are contributing to her Bil knee pain. Pt will benefit form continued POC in order to address the above deficits.    Personal Factors and Comorbidities  Age;Comorbidity 2    Comorbidities  osteoporosis, Rt knee OA, CHF    Examination-Activity Limitations  Stairs;Stand;Squat;Locomotion Level;Transfers    Examination-Participation Restrictions  Community Activity    PT Frequency  2x / week    PT Duration  8 weeks    PT Treatment/Interventions  ADLs/Self Care Home Management;Cryotherapy;Electrical Stimulation;Ultrasound;Moist Heat;Gait  training;Stair training;Functional mobility training;Therapeutic activities;Therapeutic exercise;Patient/family education;Neuromuscular re-education;Manual techniques;Passive range of motion;Taping    PT Next Visit Plan  hip and knee strength, functional mobility, hamstring flexibility, gait, work on steps    Consulted and Agree with Plan of Care  Patient       Patient will benefit from skilled therapeutic intervention in order to improve the following deficits and impairments:  Abnormal gait, Decreased activity tolerance, Decreased mobility, Decreased range of motion, Difficulty walking, Hypomobility, Impaired flexibility, Improper body mechanics, Postural dysfunction, Pain  Visit Diagnosis: Muscle weakness (generalized)  Other abnormalities of gait and mobility  Cramp and spasm  Chronic bilateral low back pain without sciatica     Problem List Patient Active Problem List   Diagnosis Date Noted  . Baker's cyst of knee, unspecified laterality 12/06/2018  . Anemia in neoplastic disease 04/14/2018  . Neurogenic claudication due to lumbar spinal stenosis 01/28/2018  . Hydronephrosis with ureteropelvic junction (UPJ) obstruction 08/22/2017  . DDD (degenerative disc disease), cervical 08/09/2017  . Hypercoagulable state due to atrial fibrillation ([place]) 08/09/2017  . Presbycusis  of both ears 08/03/2017  . Deficiency anemia 07/26/2017  . Recurrent epistaxis 02/25/2017  . Iron deficiency anemia 02/21/2017  . Pure hypercholesterolemia   . PONV (postoperative nausea and vomiting)   . PAF (paroxysmal atrial fibrillation) ([place])   . Neck mass   . Insomnia, unspecified   . History of kidney stones   . Deafness   . Closed fracture of lower end of radius with ulna   . Arthritis   . Abnormal stress test   . Rotator cuff arthropathy, right 09/08/2016  . Trigger point of right shoulder region 05/05/2016  . Spondylosis of cervical region without myelopathy or radiculopathy 05/05/2016  .  Chondrocalcinosis 05/05/2016  . Chronic pain disorder 05/05/2016  . A-fib ([place]) 01/08/2016  . Closed fracture of shaft of right femur, initial encounter ([place]) 10/25/2015  . Status post-operative repair of hip fracture 10/25/2015  . Greater trochanteric bursitis of right hip 09/29/2015  . Asymptomatic cholelithiasis 05/09/2015  . Essential hypertension 11/07/2014  . Hair loss 11/07/2014  . Bilateral leg edema 11/07/2014  . Constipation due to pain medication 11/06/2014  . Hypotension due to drugs 11/06/2014  . Chest pain 09/01/2014  . Osteoporosis 01/04/2014  . Gout 01/04/2014  . Headache 01/04/2014  . Hypothyroidism 11/19/2013  . Hypercalcemia 10/11/2013  . Insomnia 05/16/2013  . Lumbar back pain 05/16/2013  . Polycythemia vera ([place])   . Mass of right side of neck   . Senile osteoporosis   . Macular degeneration   . Sick sinus syndrome ([place]) 03/09/2012  . CAD (coronary artery disease) 12/27/2011  . Paroxysmal atrial fibrillation ([place]) 12/27/2011  . Chronic diastolic CHF (congestive heart failure) ([place]) 12/27/2011  . HTN (hypertension) 12/27/2011    Ander Purpura, PT 12/18/2018, 1:41 PM  [facility] [address] [place], [place], [address] Phone: [phone]   Fax:  [phone]  Name: Sarah Mays MRN: [id number] Date of Birth: 1930/05/25

## 2018-12-21 ENCOUNTER — Other Ambulatory Visit: Payer: Self-pay | Admitting: Internal Medicine

## 2018-12-21 DIAGNOSIS — M545 Low back pain, unspecified: Secondary | ICD-10-CM

## 2018-12-21 NOTE — Telephone Encounter (Signed)
Last filled 11/21/2018 in epic. RX request sent to [employer], DO to review [place] database and approve if necessary.

## 2018-12-22 ENCOUNTER — Telehealth: Payer: Self-pay | Admitting: Hematology and Oncology

## 2018-12-22 ENCOUNTER — Other Ambulatory Visit: Payer: Self-pay

## 2018-12-22 ENCOUNTER — Inpatient Hospital Stay (HOSPITAL_BASED_OUTPATIENT_CLINIC_OR_DEPARTMENT_OTHER): Payer: Medicare Other | Admitting: Hematology and Oncology

## 2018-12-22 ENCOUNTER — Encounter: Payer: Self-pay | Admitting: Hematology and Oncology

## 2018-12-22 ENCOUNTER — Inpatient Hospital Stay: Payer: Medicare Other | Attending: Hematology and Oncology

## 2018-12-22 DIAGNOSIS — Z7901 Long term (current) use of anticoagulants: Secondary | ICD-10-CM | POA: Insufficient documentation

## 2018-12-22 DIAGNOSIS — D45 Polycythemia vera: Secondary | ICD-10-CM

## 2018-12-22 DIAGNOSIS — Z79899 Other long term (current) drug therapy: Secondary | ICD-10-CM | POA: Diagnosis not present

## 2018-12-22 LAB — CBC WITH DIFFERENTIAL/PLATELET
Abs Immature Granulocytes: 0.05 10*3/uL (ref 0.00–0.07)
Basophils Absolute: 0.1 10*3/uL (ref 0.0–0.1)
Basophils Relative: 1 %
Eosinophils Absolute: 0.1 10*3/uL (ref 0.0–0.5)
Eosinophils Relative: 1 %
HCT: 37.4 % (ref 36.0–46.0)
Hemoglobin: 12.3 g/dL (ref 12.0–15.0)
Immature Granulocytes: 1 %
Lymphocytes Relative: 9 %
Lymphs Abs: 0.9 10*3/uL (ref 0.7–4.0)
MCH: 38.7 pg — ABNORMAL HIGH (ref 26.0–34.0)
MCHC: 32.9 g/dL (ref 30.0–36.0)
MCV: 117.6 fL — ABNORMAL HIGH (ref 80.0–100.0)
Monocytes Absolute: 0.9 10*3/uL (ref 0.1–1.0)
Monocytes Relative: 9 %
Neutro Abs: 7.8 10*3/uL — ABNORMAL HIGH (ref 1.7–7.7)
Neutrophils Relative %: 79 %
Platelets: 423 10*3/uL — ABNORMAL HIGH (ref 150–400)
RBC: 3.18 MIL/uL — ABNORMAL LOW (ref 3.87–5.11)
RDW: 17 % — ABNORMAL HIGH (ref 11.5–15.5)
WBC: 9.8 10*3/uL (ref 4.0–10.5)
nRBC: 0 % (ref 0.0–0.2)

## 2018-12-22 NOTE — Progress Notes (Signed)
[place] OFFICE PROGRESS NOTE  Patient Care Team: Gayland Curry, DO as PCP - General (Geriatric Medicine) Larey Dresser, MD as PCP - Cardiology (Cardiology) Community, [facility], Clent Demark, MD as Consulting Physician (Ophthalmology) Larey Dresser, MD as Consulting Physician (Cardiology) Heath Lark, MD as Consulting Physician (Hematology and Oncology) Clent Jacks, MD as Consulting Physician (Ophthalmology) Jerrell Belfast, MD as Consulting Physician (Otolaryngology)  ASSESSMENT & PLAN:  Polycythemia vera Her CBC is stable Even though her platelet count is marginally elevated, I do not plan to adjust the dose of hydroxyurea as it will probably induce pancytopenia in the process The patient stated she has been compliant taking medications as directed She would take Hydroxyurea 500 mg on Mondays, Wednesdays and Fridays and to take 1000 mg for the rest of the week I do not plan to adjust the dose of her treatment Plan to see her back in 4 months for further follow-up She is educated to watch out for signs and symptoms of bleeding or infection She will continue Eliquis for prevention against risk of blood clot   No orders of the defined types were placed in this encounter.   INTERVAL HISTORY: Please see below for problem oriented charting. She returns for further follow-up She denies nosebleed from recent anticoagulation therapy She is compliant taking hydroxyurea as directed She has numerous physician visits between her primary care doctor as well as her cardiologist Today, she denies shortness of breath No recent infection, fever or chills  SUMMARY OF ONCOLOGIC HISTORY: Oncology History  Polycythemia vera ([place])  08/05/2011 Pathology Results   Peripheral blood JAK2 mutation was positive with low serum erythropoietin level. Bone marrow aspirate and biopsy was not performed.   08/12/2011 -  Chemotherapy   She is started on hydroxyurea  with periodic phlebotomy.     REVIEW OF SYSTEMS:   Constitutional: Denies fevers, chills or abnormal weight loss Eyes: Denies blurriness of vision Ears, nose, mouth, throat, and face: Denies mucositis or sore throat Respiratory: Denies cough, dyspnea or wheezes Cardiovascular: Denies palpitation, chest discomfort or lower extremity swelling Gastrointestinal:  Denies nausea, heartburn or change in bowel habits Skin: Denies abnormal skin rashes Lymphatics: Denies new lymphadenopathy or easy bruising Neurological:Denies numbness, tingling or new weaknesses Behavioral/Psych: Mood is stable, no new changes  All other systems were reviewed with the patient and are negative.  I have reviewed the past medical history, past surgical history, social history and family history with the patient and they are unchanged from previous note.  ALLERGIES:  is allergic to amlodipine and augmentin [amoxicillin-pot clavulanate].  MEDICATIONS:  Current Outpatient Medications  Medication Sig Dispense Refill  . amiodarone (PACERONE) 200 MG tablet Take 1 tablet (200 mg total) by mouth daily. 90 tablet 3  . apixaban (ELIQUIS) 2.5 MG TABS tablet Take 1 tablet (2.5 mg total) by mouth 2 (two) times daily. 180 tablet 3  . Cholecalciferol (VITAMIN D) 2000 units CAPS Take 2,000 Units by mouth daily.     . fentaNYL (DURAGESIC) 12 MCG/HR Place 1 patch onto the skin every 3 (three) days. 10 patch 0  . furosemide (LASIX) 80 MG tablet Take 1 tablet (80 mg total) by mouth 2 (two) times daily. 60 tablet 6  . gabapentin (NEURONTIN) 100 MG capsule Take 100 mg by mouth daily.    . hydrALAZINE (APRESOLINE) 50 MG tablet TAKE 1 1/2 TABLETS THREE TIMES DAILY. 135 tablet 0  . hydroxyurea (HYDREA) 500 MG capsule TAKE 1 CAPSULE ON  MONDAY, WEDNESDAY,AND FRIDAY, AND 2 CAPSULES THE OTHER DAYS OF THE WEEK. 100 capsule 0  . levothyroxine (SYNTHROID) 100 MCG tablet TAKE 1 TABLET ONCE DAILY BEFORE BREAKFAST. 90 tablet 0  . Magnesium 250 MG  TABS Take 250 mg by mouth daily.     . Melatonin 10 MG TABS Take 10 mg by mouth at bedtime.    . Multiple Vitamins-Minerals (PRESERVISION AREDS 2) CAPS Take 1 capsule by mouth 2 (two) times daily.     . potassium chloride (KLOR-CON) 10 MEQ tablet TAKE 2 TABLETS IN THE MORNING AND 1 TABLET IN THE EVENING. 90 tablet 0  . sennosides-docusate sodium (SENOKOT-S) 8.6-50 MG tablet Take 2 tablets by mouth 2 (two) times daily.    . sodium chloride (OCEAN) 0.65 % SOLN nasal spray Place 2 sprays into both nostrils 2 (two) times daily.     . traMADol (ULTRAM) 50 MG tablet TAKE ONE TABLET AT BEDTIME. 30 tablet 0   No current facility-administered medications for this visit.     PHYSICAL EXAMINATION: ECOG PERFORMANCE STATUS: 1 - Symptomatic but completely ambulatory  Vitals:   12/22/18 1104  BP: 124/72  Pulse: (!) 52  Resp: 16  Temp: 98.2 F (36.8 C)  SpO2: 100%   Filed Weights   12/22/18 1104  Weight: 108 lb 6.4 oz (49.2 kg)    GENERAL:alert, no distress and comfortable Musculoskeletal:no cyanosis of digits and no clubbing  NEURO: alert & oriented x 3 with fluent speech, no focal motor/sensory deficits  LABORATORY DATA:  I have reviewed the data as listed    Component Value Date/Time   NA 137 10/13/2018 1144   NA 136 (A) 08/10/2017   NA 136 09/14/2016 1428   K 4.1 10/13/2018 1144   K 4.0 09/14/2016 1428   CL 101 10/13/2018 1144   CO2 27 10/13/2018 1144   CO2 30 (H) 09/14/2016 1428   GLUCOSE 79 10/13/2018 1144   GLUCOSE 112 09/14/2016 1428   BUN 24 (H) 10/13/2018 1144   BUN 20 08/10/2017   BUN 14.7 09/14/2016 1428   CREATININE 1.17 (H) 10/13/2018 1144   CREATININE 0.8 09/14/2016 1428   CALCIUM 10.1 10/13/2018 1144   CALCIUM 11.0 (H) 09/14/2016 1428   PROT 6.4 (L) 10/13/2018 1144   PROT 7.0 09/14/2016 1428   ALBUMIN 4.2 10/13/2018 1144   ALBUMIN 4.6 09/14/2016 1428   AST 15 10/13/2018 1144   AST 17 09/14/2016 1428   ALT 17 10/13/2018 1144   ALT 18 09/14/2016 1428    ALKPHOS 44 10/13/2018 1144   ALKPHOS 62 09/14/2016 1428   BILITOT 0.6 10/13/2018 1144   BILITOT 1.00 09/14/2016 1428   GFRNONAA 42 (L) 10/13/2018 1144   GFRAA 49 (L) 10/13/2018 1144    No results found for: SPEP, UPEP  Lab Results  Component Value Date   WBC 9.8 12/22/2018   NEUTROABS 7.8 (H) 12/22/2018   HGB 12.3 12/22/2018   HCT 37.4 12/22/2018   MCV 117.6 (H) 12/22/2018   PLT 423 (H) 12/22/2018      Chemistry      Component Value Date/Time   NA 137 10/13/2018 1144   NA 136 (A) 08/10/2017   NA 136 09/14/2016 1428   K 4.1 10/13/2018 1144   K 4.0 09/14/2016 1428   CL 101 10/13/2018 1144   CO2 27 10/13/2018 1144   CO2 30 (H) 09/14/2016 1428   BUN 24 (H) 10/13/2018 1144   BUN 20 08/10/2017   BUN 14.7 09/14/2016 1428   CREATININE  1.17 (H) 10/13/2018 1144   CREATININE 0.8 09/14/2016 1428   GLU 76 08/10/2017      Component Value Date/Time   CALCIUM 10.1 10/13/2018 1144   CALCIUM 11.0 (H) 09/14/2016 1428   ALKPHOS 44 10/13/2018 1144   ALKPHOS 62 09/14/2016 1428   AST 15 10/13/2018 1144   AST 17 09/14/2016 1428   ALT 17 10/13/2018 1144   ALT 18 09/14/2016 1428   BILITOT 0.6 10/13/2018 1144   BILITOT 1.00 09/14/2016 1428       RADIOGRAPHIC STUDIES: I have personally reviewed the radiological images as listed and agreed with the findings in the report. [place] Limited Joint Space Structures Low Left  Result Date: 12/09/2018 Procedure: Real-time Ultrasound Guided Injection of right knee Device: GE Logiq Q7 Ultrasound guided injection is preferred based studies that show increased duration, increased effect, greater accuracy, decreased procedural pain, increased response rate, and decreased cost with ultrasound guided versus blind injection. Verbal informed consent obtained. Time-out conducted. Noted no overlying erythema, induration, or other signs of local infection. Skin prepped in a sterile fashion. Local anesthesia: Topical Ethyl chloride. With sterile technique and  under real time ultrasound guidance: With a 22-gauge 2 inch needle patient was injected with 4 cc of 0.5% Marcaine and aspirated 10 cc of straw-colored fluid then injected 1 cc of Kenalog 40 mg/dL. This was from a posterior approach approach. Complex ganglion cyst noted Completed without difficulty Pain immediately resolved suggesting accurate placement of the medication. Advised to call if fevers/chills, erythema, induration, drainage, or persistent bleeding. Images permanently stored and available for review in the ultrasound unit. Impression: Technically successful ultrasound guided injection. Procedure: Real-time Ultrasound Guided Injection of left knee Device: GE Logiq Q7 Ultrasound guided injection is preferred based studies that show increased duration, increased effect, greater accuracy, decreased procedural pain, increased response rate, and decreased cost with ultrasound guided versus blind injection. Verbal informed consent obtained. Time-out conducted. Noted no overlying erythema, induration, or other signs of local infection. Skin prepped in a sterile fashion. Local anesthesia: Topical Ethyl chloride. With sterile technique and under real time ultrasound guidance: With a 22-gauge 2 inch needle patient was injected with 4 cc of 0.5% Marcaine and aspirated 30 cc of straw-colored fluid 1 cc of Kenalog 40 mg/dL. This was from posterior approach. Completed without difficulty Pain immediately resolved suggesting accurate placement of the medication. Advised to call if fevers/chills, erythema, induration, drainage, or persistent bleeding. Images permanently stored and available for review in the ultrasound unit. Impression: Technically successful ultrasound guided injection.    All questions were answered. The patient knows to call the clinic with any problems, questions or concerns. No barriers to learning was detected.  I spent 10 minutes counseling the patient face to face. The total time spent in the  appointment was 15 minutes and more than 50% was on counseling and review of test results  Heath Lark, MD 12/22/2018 11:30 AM

## 2018-12-22 NOTE — Telephone Encounter (Signed)
Scheduled appt per 11/13 sch message - pt is aware of appt date and time

## 2018-12-22 NOTE — Assessment & Plan Note (Signed)
Her CBC is stable Even though her platelet count is marginally elevated, I do not plan to adjust the dose of hydroxyurea as it will probably induce pancytopenia in the process The patient stated she has been compliant taking medications as directed She would take Hydroxyurea 500 mg on Mondays, Wednesdays and Fridays and to take 1000 mg for the rest of the week I do not plan to adjust the dose of her treatment Plan to see her back in 4 months for further follow-up She is educated to watch out for signs and symptoms of bleeding or infection She will continue Eliquis for prevention against risk of blood clot

## 2018-12-25 ENCOUNTER — Other Ambulatory Visit: Payer: Self-pay

## 2018-12-25 ENCOUNTER — Other Ambulatory Visit: Payer: Self-pay | Admitting: *Deleted

## 2018-12-25 ENCOUNTER — Ambulatory Visit: Payer: Medicare Other

## 2018-12-25 DIAGNOSIS — R2689 Other abnormalities of gait and mobility: Secondary | ICD-10-CM

## 2018-12-25 DIAGNOSIS — M545 Low back pain, unspecified: Secondary | ICD-10-CM

## 2018-12-25 DIAGNOSIS — M6281 Muscle weakness (generalized): Secondary | ICD-10-CM | POA: Diagnosis not present

## 2018-12-25 DIAGNOSIS — R252 Cramp and spasm: Secondary | ICD-10-CM | POA: Diagnosis not present

## 2018-12-25 DIAGNOSIS — G8929 Other chronic pain: Secondary | ICD-10-CM | POA: Diagnosis not present

## 2018-12-25 MED ORDER — FENTANYL 12 MCG/HR TD PT72: 1.0000 | MEDICATED_PATCH | TRANSDERMAL | 0 refills | Status: DC

## 2018-12-25 NOTE — Therapy (Signed)
[facility] [address] [place], [place], [address] Phone: [phone]   Fax:  [phone]  Physical Therapy Treatment  Patient Details  Name: Sarah Mays MRN: [id number] Date of Birth: [birth year] Referring Provider (PT): Charlann Boxer, MD   Encounter Date: 12/25/2018  PT End of Session - 12/25/18 1227    Visit Number  3    Date for PT Re-Evaluation  01/29/19    Authorization Type  Medicare    PT Start Time  1146    PT Stop Time  1226    PT Time Calculation (min)  40 min    Activity Tolerance  Patient tolerated treatment well    Behavior During Therapy  [facility] for tasks assessed/performed       Past Medical History:  Diagnosis Date  . Abnormal stress test    a. 11/2011 Ex MV: EF 80%, small, partially reversible anteroapical defect->mild ischemia vs attenuation-->med Rx.  [name] Kitchen Alopecia, unspecified   . Arthritis   . Chronic diastolic CHF (congestive heart failure) ([place])    a. 11/2013 Echo: EF 60-65%, no rwma, mild TR, PASP 59mmHg.  . Closed fracture of lower end of radius with ulna   . Deafness    Left, s/p multiple surgeries  . Essential hypertension   . Gouty arthropathy, unspecified   . History of kidney stones   . Hypothyroidism   . Insomnia, unspecified   . Macular degeneration    Left, s/p inj. tx  . Mitral valve disorders(424.0)   . Neck mass    right, w/u including MRI negative  . PAF (paroxysmal atrial fibrillation) ([facility])    a. on amio (02/2012 mild obstruction on PFT's) and xarelto.  . Polycythemia vera(238.4)   . Pure hypercholesterolemia   . Senile osteoporosis    Reclast in the past  . Tricuspid valve disorders, specified as nonrheumatic     Past Surgical History:  Procedure Laterality Date  . BREAST BIOPSY  06/18/1998   left  . CARDIOVERSION N/A 07/15/2017   Procedure: CARDIOVERSION;  Surgeon: Larey Dresser, MD;  Location: [facility] ENDOSCOPY;  Service: Cardiovascular;  Laterality: N/A;   . CARDIOVERSION N/A 07/21/2018   Procedure: CARDIOVERSION;  Surgeon: Larey Dresser, MD;  Location: [facility] ENDOSCOPY;  Service: Cardiovascular;  Laterality: N/A;  . CARDIOVERSION N/A 10/06/2018   Procedure: CARDIOVERSION;  Surgeon: Larey Dresser, MD;  Location: [facility] ENDOSCOPY;  Service: Cardiovascular;  Laterality: N/A;  . ELECTROPHYSIOLOGIC STUDY N/A 01/08/2016   Procedure: Atrial Fibrillation Ablation;  Surgeon: Thompson Grayer, MD;  Location: [place] CV LAB;  Service: Cardiovascular;  Laterality: N/A;  . FEMUR IM NAIL Right 10/08/2015   Procedure: INTRAMEDULLARY (IM) NAIL FEMORAL RIGHT;  Surgeon: Paralee Cancel, MD;  Location: [facility] ORS;  Service: Orthopedics;  Laterality: Right;  . MASS EXCISION Right 11/26/2016   Procedure: EXCISION RIGHT LATERAL NECK MASS;  Surgeon: Jerrell Belfast, MD;  Location: [place];  Service: ENT;  Laterality: Right;  . TEE WITHOUT CARDIOVERSION N/A 07/15/2017   Procedure: TRANSESOPHAGEAL ECHOCARDIOGRAM (TEE);  Surgeon: Larey Dresser, MD;  Location: [facility] ENDOSCOPY;  Service: Cardiovascular;  Laterality: N/A;  . TONSILLECTOMY    . TOTAL ABDOMINAL HYSTERECTOMY    . TYMPANOPLASTY Bilateral     There were no vitals filed for this visit.  Subjective Assessment - 12/25/18 1142    Subjective  My knee was sore after last session when I did the bike.    Currently in Pain?  Yes    Pain Score  3    LBP 5/10   Pain Location  Knee    Pain Orientation  Right    Pain Descriptors / Indicators  Aching    Pain Type  Chronic pain    Pain Onset  More than a month ago    Pain Frequency  Constant    Aggravating Factors   walking    Pain Relieving Factors  rest, sitting                       [facility] Adult PT Treatment/Exercise - 12/25/18 0001      Lumbar Exercises: Standing   Heel Raises  20 reps      Lumbar Exercises: Seated   Long Arc Quad on Chair  2 sets;10 reps;Strengthening;Right;Left    LAQ on Chair Weights (lbs)  1    Other Seated Lumbar Exercises  hip  abduction with green theraband 20x       Knee/Hip Exercises: Standing   Heel Raises  Both;2 sets;10 reps    Forward Step Up  Both;5 reps;3 sets;Step Height: 6";Right    Other Standing Knee Exercises  weight shifting 3 ways on balance pad x 1 minute each    Other Standing Knee Exercises  mini squats 2x5      Knee/Hip Exercises: Seated   Marching  Strengthening;Both;2 sets;10 reps;Weights    Marching Weights  1 lbs.    Hamstring Curl  Strengthening;Both;2 sets;10 reps               PT Short Term Goals - 12/04/18 1138      PT SHORT TERM GOAL #1   Title  be independent in initial HEP    Time  4    Period  Weeks    Status  New    Target Date  01/01/19      PT SHORT TERM GOAL #2   Title  perform 5x sit to stand in < or = to 18 seconds without UE    Time  4    Period  Weeks    Status  New    Target Date  01/01/19      PT SHORT TERM GOAL #3   Title  improve LE strength to perform stand to sit with eccentric control without UE support    Time  4    Period  Weeks    Status  New    Target Date  01/01/19      PT SHORT TERM GOAL #4   Title  improve endurance to ride NuStep for 6-8 minutes without fatigue    Time  4    Period  Weeks    Status  New    Target Date  01/01/19        PT Long Term Goals - 12/04/18 1138      PT LONG TERM GOAL #1   Title  independent with advanced HEP for strength and endurance    Baseline  -    Time  8    Period  Weeks    Status  New    Target Date  01/29/19      PT LONG TERM GOAL #2   Title  perform 5x sit to stand in < or = to 15 seconds to improve balance    Baseline  --    Time  8    Period  Weeks    Status  New    Target Date  01/29/19  PT LONG TERM GOAL #3   Title  improve LE strength and endurance to walk for 5-8 minutes without fatigue    Baseline  --    Time  8    Period  Weeks    Status  New    Target Date  01/29/19      PT LONG TERM GOAL #4   Title  improve LE strength to negotiate steps with independence  and use of 1 rail on the Lt to allow for visits to her daughter's house    Baseline  --    Time  8    Period  Weeks    Status  New    Target Date  01/29/19            Plan - 12/25/18 1207    Clinical Impression Statement  Pt with increased low back pain today so limited standing today.  Session focused on low level hip and knee strength to address bil knee OA.  Pt with valgus and wide base of support  with gait using rolling walker.  Pt was able to perform mini-squats with very limited ROM without increased knee pain. Pt has significant weakness in her Bil quads and hip abductors that are contributing to her Bil knee pain. Pt will benefit form continued POC in order to address these deficits    PT Frequency  2x / week    PT Duration  8 weeks    PT Treatment/Interventions  ADLs/Self Care Home Management;Cryotherapy;Electrical Stimulation;Ultrasound;Moist Heat;Gait training;Stair training;Functional mobility training;Therapeutic activities;Therapeutic exercise;Patient/family education;Neuromuscular re-education;Manual techniques;Passive range of motion;Taping    PT Next Visit Plan  hip and knee strength, functional mobility, hamstring flexibility, gait, work on steps    PT Home Exercise Plan  [id number]    Consulted and Agree with Plan of Care  Patient       Patient will benefit from skilled therapeutic intervention in order to improve the following deficits and impairments:  Abnormal gait, Decreased activity tolerance, Decreased mobility, Decreased range of motion, Difficulty walking, Hypomobility, Impaired flexibility, Improper body mechanics, Postural dysfunction, Pain  Visit Diagnosis: Other abnormalities of gait and mobility  Muscle weakness (generalized)     Problem List Patient Active Problem List   Diagnosis Date Noted  . Baker's cyst of knee, unspecified laterality 12/06/2018  . Anemia in neoplastic disease 04/14/2018  . Neurogenic claudication due to lumbar spinal  stenosis 01/28/2018  . Hydronephrosis with ureteropelvic junction (UPJ) obstruction 08/22/2017  . DDD (degenerative disc disease), cervical 08/09/2017  . Hypercoagulable state due to atrial fibrillation ([place]) 08/09/2017  . Presbycusis of both ears 08/03/2017  . Deficiency anemia 07/26/2017  . Pure hypercholesterolemia   . PONV (postoperative nausea and vomiting)   . PAF (paroxysmal atrial fibrillation) ([place])   . Neck mass   . Insomnia, unspecified   . History of kidney stones   . Deafness   . Closed fracture of lower end of radius with ulna   . Arthritis   . Abnormal stress test   . Rotator cuff arthropathy, right 09/08/2016  . Trigger point of right shoulder region 05/05/2016  . Spondylosis of cervical region without myelopathy or radiculopathy 05/05/2016  . Chondrocalcinosis 05/05/2016  . Chronic pain disorder 05/05/2016  . A-fib ([place]) 01/08/2016  . Closed fracture of shaft of right femur, initial encounter ([place]) 10/25/2015  . Status post-operative repair of hip fracture 10/25/2015  . Greater trochanteric bursitis of right hip 09/29/2015  . Asymptomatic cholelithiasis 05/09/2015  . Hair loss 11/07/2014  .  Bilateral leg edema 11/07/2014  . Constipation due to pain medication 11/06/2014  . Hypotension due to drugs 11/06/2014  . Chest pain 09/01/2014  . Osteoporosis 01/04/2014  . Gout 01/04/2014  . Headache 01/04/2014  . Hypothyroidism 11/19/2013  . Hypercalcemia 10/11/2013  . Insomnia 05/16/2013  . Lumbar back pain 05/16/2013  . Polycythemia vera ([place])   . Mass of right side of neck   . Senile osteoporosis   . Macular degeneration   . Sick sinus syndrome ([place]) 03/09/2012  . CAD (coronary artery disease) 12/27/2011  . Paroxysmal atrial fibrillation ([place]) 12/27/2011  . Chronic diastolic CHF (congestive heart failure) ([place]) 12/27/2011  . HTN (hypertension) 12/27/2011    Sarah Mays, PT 12/25/18 12:29 PM  [facility] [address] [place], [place], [address] Phone: [phone]   Fax:  [phone]  Name: Sarah Mays MRN: [id number] Date of Birth: 01-20-1931

## 2018-12-25 NOTE — Telephone Encounter (Signed)
Patient requested refill [place] Database Verified LR: 11/24/2018 Pended Rx and sent to Dr. Mariea Clonts for approval.   Needs Narcotic Contract signed. Added note to appointment scheduled 12/16

## 2019-01-02 DIAGNOSIS — K148 Other diseases of tongue: Secondary | ICD-10-CM | POA: Diagnosis not present

## 2019-01-02 DIAGNOSIS — H903 Sensorineural hearing loss, bilateral: Secondary | ICD-10-CM | POA: Diagnosis not present

## 2019-01-03 ENCOUNTER — Other Ambulatory Visit (HOSPITAL_COMMUNITY): Payer: Self-pay | Admitting: Cardiology

## 2019-01-08 ENCOUNTER — Ambulatory Visit: Payer: Medicare Other

## 2019-01-08 ENCOUNTER — Other Ambulatory Visit: Payer: Self-pay

## 2019-01-08 DIAGNOSIS — M6281 Muscle weakness (generalized): Secondary | ICD-10-CM | POA: Diagnosis not present

## 2019-01-08 DIAGNOSIS — R252 Cramp and spasm: Secondary | ICD-10-CM

## 2019-01-08 DIAGNOSIS — M545 Low back pain: Secondary | ICD-10-CM | POA: Diagnosis not present

## 2019-01-08 DIAGNOSIS — R2689 Other abnormalities of gait and mobility: Secondary | ICD-10-CM | POA: Diagnosis not present

## 2019-01-08 DIAGNOSIS — G8929 Other chronic pain: Secondary | ICD-10-CM | POA: Diagnosis not present

## 2019-01-08 NOTE — Therapy (Signed)
[facility] [address], [place], [place], [address] Phone: [phone]   Fax:  [phone]  Physical Therapy Treatment  Patient Details  Name: Sarah Mays MRN: [id number] Date of Birth: 09-09-1930 Referring Provider (PT): Charlann Boxer, MD   Encounter Date: 01/08/2019  PT End of Session - 01/08/19 1257    Visit Number  4    Date for PT Re-Evaluation  01/29/19    Authorization Type  Medicare    PT Start Time  1219    PT Stop Time  1258    PT Time Calculation (min)  39 min    Activity Tolerance  Patient tolerated treatment well    Behavior During Therapy  [facility] for tasks assessed/performed       Past Medical History:  Diagnosis Date  . Abnormal stress test    a. 11/2011 Ex MV: EF 80%, small, partially reversible anteroapical defect->mild ischemia vs attenuation-->med Rx.  [name] Kitchen Alopecia, unspecified   . Arthritis   . Chronic diastolic CHF (congestive heart failure) ([place])    a. 11/2013 Echo: EF 60-65%, no rwma, mild TR, PASP 13mmHg.  . Closed fracture of lower end of radius with ulna   . Deafness    Left, s/p multiple surgeries  . Essential hypertension   . Gouty arthropathy, unspecified   . History of kidney stones   . Hypothyroidism   . Insomnia, unspecified   . Macular degeneration    Left, s/p inj. tx  . Mitral valve disorders(424.0)   . Neck mass    right, w/u including MRI negative  . PAF (paroxysmal atrial fibrillation) (HCC)    a. on amio (02/2012 mild obstruction on PFT's) and xarelto.  . Polycythemia vera(238.4)   . Pure hypercholesterolemia   . Senile osteoporosis    Reclast in the past  . Tricuspid valve disorders, specified as nonrheumatic     Past Surgical History:  Procedure Laterality Date  . BREAST BIOPSY  06/18/1998   left  . CARDIOVERSION N/A 07/15/2017   Procedure: CARDIOVERSION;  Surgeon: Larey Dresser, MD;  Location: [facility] ENDOSCOPY;  Service: Cardiovascular;  Laterality: N/A;   . CARDIOVERSION N/A 07/21/2018   Procedure: CARDIOVERSION;  Surgeon: Larey Dresser, MD;  Location: [facility] ENDOSCOPY;  Service: Cardiovascular;  Laterality: N/A;  . CARDIOVERSION N/A 10/06/2018   Procedure: CARDIOVERSION;  Surgeon: Larey Dresser, MD;  Location: [facility] ENDOSCOPY;  Service: Cardiovascular;  Laterality: N/A;  . ELECTROPHYSIOLOGIC STUDY N/A 01/08/2016   Procedure: Atrial Fibrillation Ablation;  Surgeon: Thompson Grayer, MD;  Location: [place] CV LAB;  Service: Cardiovascular;  Laterality: N/A;  . FEMUR IM NAIL Right 10/08/2015   Procedure: INTRAMEDULLARY (IM) NAIL FEMORAL RIGHT;  Surgeon: Paralee Cancel, MD;  Location: [facility];  Service: Orthopedics;  Laterality: Right;  . MASS EXCISION Right 11/26/2016   Procedure: EXCISION RIGHT LATERAL NECK MASS;  Surgeon: Jerrell Belfast, MD;  Location: [place];  Service: ENT;  Laterality: Right;  . TEE WITHOUT CARDIOVERSION N/A 07/15/2017   Procedure: TRANSESOPHAGEAL ECHOCARDIOGRAM (TEE);  Surgeon: Larey Dresser, MD;  Location: [facility] ENDOSCOPY;  Service: Cardiovascular;  Laterality: N/A;  . TONSILLECTOMY    . TOTAL ABDOMINAL HYSTERECTOMY    . TYMPANOPLASTY Bilateral     There were no vitals filed for this visit.  Subjective Assessment - 01/08/19 1221    Subjective  My knees are OK.  I have not been doing my exercises.    Currently in Pain?  Yes    Pain Score  2     Pain Location  Knee    Pain Orientation  Right    Pain Onset  More than a month ago    Pain Frequency  Constant    Aggravating Factors   walking    Pain Relieving Factors  rest, sitting                       [facility] Adult PT Treatment/Exercise - 01/08/19 0001      Lumbar Exercises: Standing   Heel Raises  20 reps      Lumbar Exercises: Seated   Long Arc Quad on Chair  2 sets;10 reps;Strengthening;Right;Left    LAQ on Chair Weights (lbs)  1    Other Seated Lumbar Exercises  hip abduction with green theraband 20x       Knee/Hip Exercises: Standing   Heel  Raises  Both;2 sets;10 reps    Hip Abduction  Stengthening;Both;2 sets;10 reps    Other Standing Knee Exercises  weight shifting 3 ways on balance pad x 1 minute each    Other Standing Knee Exercises  alternating step taps on 6" step: 2x10 each      Knee/Hip Exercises: Seated   Marching  Strengthening;Both;2 sets;10 reps;Weights    Marching Weights  1 lbs.    Hamstring Curl  Strengthening;Both;2 sets;10 reps               PT Short Term Goals - 01/08/19 1259      PT SHORT TERM GOAL #1   Title  be independent in initial HEP    Time  4    Period  Weeks    Status  On-going      PT SHORT TERM GOAL #2   Title  perform 5x sit to stand in < or = to 18 seconds without UE    Time  4    Period  Weeks    Status  On-going      PT SHORT TERM GOAL #3   Title  improve LE strength to perform stand to sit with eccentric control without UE support    Baseline  improved control 50% of the time    Time  4    Period  Weeks    Status  On-going      PT SHORT TERM GOAL #4   Title  improve endurance to ride NuStep for 6-8 minutes without fatigue    Baseline  too much knee pain for this    Status  Deferred        PT Long Term Goals - 12/04/18 1138      PT LONG TERM GOAL #1   Title  independent with advanced HEP for strength and endurance    Baseline  -    Time  8    Period  Weeks    Status  New    Target Date  01/29/19      PT LONG TERM GOAL #2   Title  perform 5x sit to stand in < or = to 15 seconds to improve balance    Baseline  --    Time  8    Period  Weeks    Status  New    Target Date  01/29/19      PT LONG TERM GOAL #3   Title  improve LE strength and endurance to walk for 5-8 minutes without fatigue    Baseline  --    Time  8  Period  Weeks    Status  New    Target Date  01/29/19      PT LONG TERM GOAL #4   Title  improve LE strength to negotiate steps with independence and use of 1 rail on the Lt to allow for visits to her daughter's house    Baseline  --     Time  8    Period  Weeks    Status  New    Target Date  01/29/19            Plan - 01/08/19 1231    Clinical Impression Statement  Pt has had a lapse in treatment due to canceled appointment last week.  Pt reports non-compliance with HEP.  Session focused on low level strength exercises to pt tolerance.  PT was present to monitor for pain and to provide verbal cues for technique with exercise.  Pt with chronic and significant bil knee OA and will continue to benefit from skilled PT for strength, flexibility and endurance to improve safety and functional community mobility.    PT Treatment/Interventions  ADLs/Self Care Home Management;Cryotherapy;Electrical Stimulation;Ultrasound;Moist Heat;Gait training;Stair training;Functional mobility training;Therapeutic activities;Therapeutic exercise;Patient/family education;Neuromuscular re-education;Manual techniques;Passive range of motion;Taping    PT Next Visit Plan  hip and knee strength, functional mobility, hamstring flexibility, gait, work on steps    PT Home Exercise Plan  [id number]    Consulted and Agree with Plan of Care  Patient       Patient will benefit from skilled therapeutic intervention in order to improve the following deficits and impairments:  Abnormal gait, Decreased activity tolerance, Decreased mobility, Decreased range of motion, Difficulty walking, Hypomobility, Impaired flexibility, Improper body mechanics, Postural dysfunction, Pain  Visit Diagnosis: Muscle weakness (generalized)  Other abnormalities of gait and mobility  Cramp and spasm     Problem List Patient Active Problem List   Diagnosis Date Noted  . Baker's cyst of knee, unspecified laterality 12/06/2018  . Anemia in neoplastic disease 04/14/2018  . Neurogenic claudication due to lumbar spinal stenosis 01/28/2018  . Hydronephrosis with ureteropelvic junction (UPJ) obstruction 08/22/2017  . DDD (degenerative disc disease), cervical 08/09/2017  .  Hypercoagulable state due to atrial fibrillation ([place]) 08/09/2017  . Presbycusis of both ears 08/03/2017  . Deficiency anemia 07/26/2017  . Pure hypercholesterolemia   . PONV (postoperative nausea and vomiting)   . PAF (paroxysmal atrial fibrillation) ([place])   . Neck mass   . Insomnia, unspecified   . History of kidney stones   . Deafness   . Closed fracture of lower end of radius with ulna   . Arthritis   . Abnormal stress test   . Rotator cuff arthropathy, right 09/08/2016  . Trigger point of right shoulder region 05/05/2016  . Spondylosis of cervical region without myelopathy or radiculopathy 05/05/2016  . Chondrocalcinosis 05/05/2016  . Chronic pain disorder 05/05/2016  . A-fib ([place]) 01/08/2016  . Closed fracture of shaft of right femur, initial encounter ([place]) 10/25/2015  . Status post-operative repair of hip fracture 10/25/2015  . Greater trochanteric bursitis of right hip 09/29/2015  . Asymptomatic cholelithiasis 05/09/2015  . Hair loss 11/07/2014  . Bilateral leg edema 11/07/2014  . Constipation due to pain medication 11/06/2014  . Hypotension due to drugs 11/06/2014  . Chest pain 09/01/2014  . Osteoporosis 01/04/2014  . Gout 01/04/2014  . Headache 01/04/2014  . Hypothyroidism 11/19/2013  . Hypercalcemia 10/11/2013  . Insomnia 05/16/2013  . Lumbar back pain 05/16/2013  . Polycythemia vera ([place])   .  Mass of right side of neck   . Senile osteoporosis   . Macular degeneration   . Sick sinus syndrome ([place]) 03/09/2012  . CAD (coronary artery disease) 12/27/2011  . Paroxysmal atrial fibrillation ([place]) 12/27/2011  . Chronic diastolic CHF (congestive heart failure) ([place]) 12/27/2011  . HTN (hypertension) 12/27/2011     Sigurd Sos, PT 01/08/19 1:00 PM  [facility] [address] [place], [place], [address] Phone: [phone]   Fax:  [phone]  Name: JOYLYN BEZIO MRN: [id number] Date of Birth:  [birth year]

## 2019-01-09 DIAGNOSIS — H353222 Exudative age-related macular degeneration, left eye, with inactive choroidal neovascularization: Secondary | ICD-10-CM | POA: Diagnosis not present

## 2019-01-09 DIAGNOSIS — H353211 Exudative age-related macular degeneration, right eye, with active choroidal neovascularization: Secondary | ICD-10-CM | POA: Diagnosis not present

## 2019-01-09 DIAGNOSIS — H353124 Nonexudative age-related macular degeneration, left eye, advanced atrophic with subfoveal involvement: Secondary | ICD-10-CM | POA: Diagnosis not present

## 2019-01-10 DIAGNOSIS — S0501XA Injury of conjunctiva and corneal abrasion without foreign body, right eye, initial encounter: Secondary | ICD-10-CM | POA: Diagnosis not present

## 2019-01-11 ENCOUNTER — Ambulatory Visit: Payer: Medicare Other | Attending: Physical Medicine & Rehabilitation

## 2019-01-11 ENCOUNTER — Other Ambulatory Visit: Payer: Self-pay

## 2019-01-11 DIAGNOSIS — R2689 Other abnormalities of gait and mobility: Secondary | ICD-10-CM

## 2019-01-11 DIAGNOSIS — M6281 Muscle weakness (generalized): Secondary | ICD-10-CM | POA: Diagnosis not present

## 2019-01-11 NOTE — Therapy (Signed)
[facility] [address] [place], [place], [address] Phone: [phone]   Fax:  [phone]  Physical Therapy Treatment  Patient Details  Name: Sarah Mays MRN: [id number] Date of Birth: 21-Aug-1930 Referring Provider (PT): Charlann Boxer, MD   Encounter Date: 01/11/2019  PT End of Session - 01/11/19 1227    Visit Number  5    Date for PT Re-Evaluation  01/29/19    Authorization Type  Medicare    PT Start Time  1147    PT Stop Time  1225    PT Time Calculation (min)  38 min    Activity Tolerance  Patient tolerated treatment well    Behavior During Therapy  [facility] for tasks assessed/performed       Past Medical History:  Diagnosis Date  . Abnormal stress test    a. 11/2011 Ex MV: EF 80%, small, partially reversible anteroapical defect->mild ischemia vs attenuation-->med Rx.  [name] Kitchen Alopecia, unspecified   . Arthritis   . Chronic diastolic CHF (congestive heart failure) ([place])    a. 11/2013 Echo: EF 60-65%, no rwma, mild TR, PASP 9mmHg.  . Closed fracture of lower end of radius with ulna   . Deafness    Left, s/p multiple surgeries  . Essential hypertension   . Gouty arthropathy, unspecified   . History of kidney stones   . Hypothyroidism   . Insomnia, unspecified   . Macular degeneration    Left, s/p inj. tx  . Mitral valve disorders(424.0)   . Neck mass    right, w/u including MRI negative  . PAF (paroxysmal atrial fibrillation) (HCC)    a. on amio (02/2012 mild obstruction on PFT's) and xarelto.  . Polycythemia vera(238.4)   . Pure hypercholesterolemia   . Senile osteoporosis    Reclast in the past  . Tricuspid valve disorders, specified as nonrheumatic     Past Surgical History:  Procedure Laterality Date  . BREAST BIOPSY  06/18/1998   left  . CARDIOVERSION N/A 07/15/2017   Procedure: CARDIOVERSION;  Surgeon: Larey Dresser, MD;  Location: [facility] ENDOSCOPY;  Service: Cardiovascular;  Laterality: N/A;  .  CARDIOVERSION N/A 07/21/2018   Procedure: CARDIOVERSION;  Surgeon: Larey Dresser, MD;  Location: [facility] ENDOSCOPY;  Service: Cardiovascular;  Laterality: N/A;  . CARDIOVERSION N/A 10/06/2018   Procedure: CARDIOVERSION;  Surgeon: Larey Dresser, MD;  Location: [facility] ENDOSCOPY;  Service: Cardiovascular;  Laterality: N/A;  . ELECTROPHYSIOLOGIC STUDY N/A 01/08/2016   Procedure: Atrial Fibrillation Ablation;  Surgeon: Thompson Grayer, MD;  Location: [place] CV LAB;  Service: Cardiovascular;  Laterality: N/A;  . FEMUR IM NAIL Right 10/08/2015   Procedure: INTRAMEDULLARY (IM) NAIL FEMORAL RIGHT;  Surgeon: Paralee Cancel, MD;  Location: [facility];  Service: Orthopedics;  Laterality: Right;  . MASS EXCISION Right 11/26/2016   Procedure: EXCISION RIGHT LATERAL NECK MASS;  Surgeon: Jerrell Belfast, MD;  Location: [place];  Service: ENT;  Laterality: Right;  . TEE WITHOUT CARDIOVERSION N/A 07/15/2017   Procedure: TRANSESOPHAGEAL ECHOCARDIOGRAM (TEE);  Surgeon: Larey Dresser, MD;  Location: [facility] ENDOSCOPY;  Service: Cardiovascular;  Laterality: N/A;  . TONSILLECTOMY    . TOTAL ABDOMINAL HYSTERECTOMY    . TYMPANOPLASTY Bilateral     There were no vitals filed for this visit.  Subjective Assessment - 01/11/19 1156    Subjective  I did my exercises yesterday.    Currently in Pain?  No/denies         [facility] PT Assessment -  01/11/19 0001      Transfers   Five time sit to stand comments   19.82 using hands     Stand to Sit  With upper extremity assist                   [facility] Adult PT Treatment/Exercise - 01/11/19 0001      Lumbar Exercises: Standing   Heel Raises  20 reps      Lumbar Exercises: Seated   Long Arc Quad on Chair  2 sets;10 reps;Strengthening;Right;Left    Other Seated Lumbar Exercises  hip abduction with green theraband 20x       Knee/Hip Exercises: Standing   Heel Raises  Both;2 sets;10 reps    Hip Abduction  Stengthening;Both;2 sets;10 reps    Abduction Limitations  1# added     Forward Step Up  Both;5 reps;3 sets;Right;Step Height: 4"    Other Standing Knee Exercises  weight shifting 3 ways on balance pad x 1 minute each    Other Standing Knee Exercises  alternating step taps on 6" step: 2x10 each      Knee/Hip Exercises: Seated   Marching  Strengthening;Both;2 sets;10 reps;Weights    Marching Weights  1 lbs.    Hamstring Curl  Strengthening;Both;2 sets;10 reps               PT Short Term Goals - 01/11/19 1156      PT SHORT TERM GOAL #1   Title  be independent in initial HEP    Status  Achieved      PT SHORT TERM GOAL #3   Title  improve LE strength to perform stand to sit with eccentric control without UE support    Baseline  improved control 50% of the time.    Status  On-going      PT SHORT TERM GOAL #4   Title  improve endurance to ride NuStep for 6-8 minutes without fatigue    Baseline  too much knee pain for this    Status  Deferred        PT Long Term Goals - 12/04/18 1138      PT LONG TERM GOAL #1   Title  independent with advanced HEP for strength and endurance    Baseline  -    Time  8    Period  Weeks    Status  New    Target Date  01/29/19      PT LONG TERM GOAL #2   Title  perform 5x sit to stand in < or = to 15 seconds to improve balance    Baseline  --    Time  8    Period  Weeks    Status  New    Target Date  01/29/19      PT LONG TERM GOAL #3   Title  improve LE strength and endurance to walk for 5-8 minutes without fatigue    Baseline  --    Time  8    Period  Weeks    Status  New    Target Date  01/29/19      PT LONG TERM GOAL #4   Title  improve LE strength to negotiate steps with independence and use of 1 rail on the Lt to allow for visits to her daughter's house    Baseline  --    Time  8    Period  Weeks    Status  New  Target Date  01/29/19            Plan - 01/11/19 1220    Clinical Impression Statement  Pt continues to be challenged by low level exercise and has knee pain with  standing long periods.  Pt has uncontrolled descent with stand to sit transition and difficulty with performing sit to stand without UE support.  Pt with improved technique with step-up on 4" step leading with the Lt LE.  Pt requires stand by assistance to contact guard assistance for safety with standing exercise.  Pt will continue to benefit from skilled PT for strength, balance and endurance.    PT Frequency  2x / week    PT Duration  8 weeks    PT Treatment/Interventions  ADLs/Self Care Home Management;Cryotherapy;Electrical Stimulation;Ultrasound;Moist Heat;Gait training;Stair training;Functional mobility training;Therapeutic activities;Therapeutic exercise;Patient/family education;Neuromuscular re-education;Manual techniques;Passive range of motion;Taping    PT Next Visit Plan  hip and knee strength, functional mobility, hamstring flexibility, gait, work on steps    PT Home Exercise Plan  [id number]    Consulted and Agree with Plan of Care  Patient       Patient will benefit from skilled therapeutic intervention in order to improve the following deficits and impairments:  Abnormal gait, Decreased activity tolerance, Decreased mobility, Decreased range of motion, Difficulty walking, Hypomobility, Impaired flexibility, Improper body mechanics, Postural dysfunction, Pain  Visit Diagnosis: Muscle weakness (generalized)  Other abnormalities of gait and mobility     Problem List Patient Active Problem List   Diagnosis Date Noted  . Baker's cyst of knee, unspecified laterality 12/06/2018  . Anemia in neoplastic disease 04/14/2018  . Neurogenic claudication due to lumbar spinal stenosis 01/28/2018  . Hydronephrosis with ureteropelvic junction (UPJ) obstruction 08/22/2017  . DDD (degenerative disc disease), cervical 08/09/2017  . Hypercoagulable state due to atrial fibrillation ([place]) 08/09/2017  . Presbycusis of both ears 08/03/2017  . Deficiency anemia 07/26/2017  . Pure  hypercholesterolemia   . PONV (postoperative nausea and vomiting)   . PAF (paroxysmal atrial fibrillation) ([place])   . Neck mass   . Insomnia, unspecified   . History of kidney stones   . Deafness   . Closed fracture of lower end of radius with ulna   . Arthritis   . Abnormal stress test   . Rotator cuff arthropathy, right 09/08/2016  . Trigger point of right shoulder region 05/05/2016  . Spondylosis of cervical region without myelopathy or radiculopathy 05/05/2016  . Chondrocalcinosis 05/05/2016  . Chronic pain disorder 05/05/2016  . A-fib ([place]) 01/08/2016  . Closed fracture of shaft of right femur, initial encounter ([place]) 10/25/2015  . Status post-operative repair of hip fracture 10/25/2015  . Greater trochanteric bursitis of right hip 09/29/2015  . Asymptomatic cholelithiasis 05/09/2015  . Hair loss 11/07/2014  . Bilateral leg edema 11/07/2014  . Constipation due to pain medication 11/06/2014  . Hypotension due to drugs 11/06/2014  . Chest pain 09/01/2014  . Osteoporosis 01/04/2014  . Gout 01/04/2014  . Headache 01/04/2014  . Hypothyroidism 11/19/2013  . Hypercalcemia 10/11/2013  . Insomnia 05/16/2013  . Lumbar back pain 05/16/2013  . Polycythemia vera ([place])   . Mass of right side of neck   . Senile osteoporosis   . Macular degeneration   . Sick sinus syndrome ([place]) 03/09/2012  . CAD (coronary artery disease) 12/27/2011  . Paroxysmal atrial fibrillation ([place]) 12/27/2011  . Chronic diastolic CHF (congestive heart failure) ([place]) 12/27/2011  . HTN (hypertension) 12/27/2011     Sigurd Sos, PT  01/11/19 12:28 PM  [facility] [address] [place], [place], [address] Phone: [phone]   Fax:  [phone]  Name: AILINE RUESGA MRN: [id number] Date of Birth: 01-04-1931

## 2019-01-14 ENCOUNTER — Other Ambulatory Visit: Payer: Self-pay | Admitting: Internal Medicine

## 2019-01-15 ENCOUNTER — Other Ambulatory Visit: Payer: Self-pay

## 2019-01-15 ENCOUNTER — Ambulatory Visit: Payer: Medicare Other

## 2019-01-15 DIAGNOSIS — M6281 Muscle weakness (generalized): Secondary | ICD-10-CM

## 2019-01-15 DIAGNOSIS — R2689 Other abnormalities of gait and mobility: Secondary | ICD-10-CM

## 2019-01-15 NOTE — Therapy (Addendum)
[facility] [address] [place], [place], [address] Phone: [phone]   Fax:  [phone]  Physical Therapy Treatment  Patient Details  Name: Sarah Mays MRN: [id number] Date of Birth: 11/16/1930 Referring Provider (PT): Charlann Boxer, MD   Encounter Date: 01/15/2019  PT End of Session - 01/15/19 1226    Visit Number  6    Date for PT Re-Evaluation  01/29/19    Authorization Type  Medicare    PT Start Time  1146    PT Stop Time  1225    PT Time Calculation (min)  39 min    Activity Tolerance  Patient tolerated treatment well    Behavior During Therapy  [facility] for tasks assessed/performed       Past Medical History:  Diagnosis Date  . Abnormal stress test    a. 11/2011 Ex MV: EF 80%, small, partially reversible anteroapical defect->mild ischemia vs attenuation-->med Rx.  [name] Kitchen Alopecia, unspecified   . Arthritis   . Chronic diastolic CHF (congestive heart failure) ([place])    a. 11/2013 Echo: EF 60-65%, no rwma, mild TR, PASP 11mHg.  . Closed fracture of lower end of radius with ulna   . Deafness    Left, s/p multiple surgeries  . Essential hypertension   . Gouty arthropathy, unspecified   . History of kidney stones   . Hypothyroidism   . Insomnia, unspecified   . Macular degeneration    Left, s/p inj. tx  . Mitral valve disorders(424.0)   . Neck mass    right, w/u including MRI negative  . PAF (paroxysmal atrial fibrillation) (HCC)    a. on amio (02/2012 mild obstruction on PFT's) and xarelto.  . Polycythemia vera(238.4)   . Pure hypercholesterolemia   . Senile osteoporosis    Reclast in the past  . Tricuspid valve disorders, specified as nonrheumatic     Past Surgical History:  Procedure Laterality Date  . BREAST BIOPSY  06/18/1998   left  . CARDIOVERSION N/A 07/15/2017   Procedure: CARDIOVERSION;  Surgeon: MLarey Dresser MD;  [facility];  Service: Cardiovascular;  Laterality: N/A;  .  CARDIOVERSION N/A 07/21/2018   Procedure: CARDIOVERSION;  Surgeon: MLarey Dresser MD;  [facility];  Service: Cardiovascular;  Laterality: N/A;  . CARDIOVERSION N/A 10/06/2018   Procedure: CARDIOVERSION;  Surgeon: MLarey Dresser MD;  [facility];  Service: Cardiovascular;  Laterality: N/A;  . ELECTROPHYSIOLOGIC STUDY N/A 01/08/2016   Procedure: Atrial Fibrillation Ablation;  Surgeon: JThompson Grayer MD;  [facility];  Service: Cardiovascular;  Laterality: N/A;  . FEMUR IM NAIL Right 10/08/2015   Procedure: INTRAMEDULLARY (IM) NAIL FEMORAL RIGHT;  Surgeon: MParalee Cancel MD;  Location: [facility];  Service: Orthopedics;  Laterality: Right;  . MASS EXCISION Right 11/26/2016   Procedure: EXCISION RIGHT LATERAL NECK MASS;  Surgeon: SJerrell Belfast MD;  [facility]  Service: ENT;  Laterality: Right;  . TEE WITHOUT CARDIOVERSION N/A 07/15/2017   Procedure: TRANSESOPHAGEAL ECHOCARDIOGRAM (TEE);  Surgeon: MLarey Dresser MD;  [facility];  Service: Cardiovascular;  Laterality: N/A;  . TONSILLECTOMY    . TOTAL ABDOMINAL HYSTERECTOMY    . TYMPANOPLASTY Bilateral     There were no vitals filed for this visit.  Subjective Assessment - 01/15/19 1153    Subjective  I had soreness after I did my exercises on Friday so I didn't do any more.    Currently in Pain?  Yes  Pain Score  4     Pain Location  Knee    Pain Orientation  Right;Left    Pain Descriptors / Indicators  Aching    Pain Type  Chronic pain    Pain Onset  More than a month ago    Pain Frequency  Constant    Aggravating Factors   exercises some times, sit to stand    Pain Relieving Factors  rest, stretching                       [facility] Adult PT Treatment/Exercise - 01/15/19 0001      Lumbar Exercises: Standing   Heel Raises  20 reps      Lumbar Exercises: Seated   Long Arc Quad on Chair  2 sets;10 reps;Strengthening;Right;Left    LAQ on Chair Weights (lbs)  1     Other Seated Lumbar Exercises  hip abduction with green theraband 20x       Knee/Hip Exercises: Standing   Heel Raises  Both;2 sets;10 reps    Hip Abduction  Stengthening;Both;2 sets;10 reps    Abduction Limitations  1# added    Forward Step Up  Both;5 reps;3 sets;Right;Step Height: 4"    Wall Squat Limitations  mini squats x10    pain with this   Other Standing Knee Exercises  alternating step taps on 6" step: 2x10 each               PT Short Term Goals - 01/11/19 1156      PT SHORT TERM GOAL #1   Title  be independent in initial HEP    Status  Achieved      PT SHORT TERM GOAL #3   Title  improve LE strength to perform stand to sit with eccentric control without UE support    Baseline  improved control 50% of the time.    Status  On-going      PT SHORT TERM GOAL #4   Title  improve endurance to ride NuStep for 6-8 minutes without fatigue    Baseline  too much knee pain for this    Status  Deferred        PT Long Term Goals - 12/04/18 1138      PT LONG TERM GOAL #1   Title  independent with advanced HEP for strength and endurance    Baseline  -    Time  8    Period  Weeks    Status  New    Target Date  01/29/19      PT LONG TERM GOAL #2   Title  perform 5x sit to stand in < or = to 15 seconds to improve balance    Baseline  --    Time  8    Period  Weeks    Status  New    Target Date  01/29/19      PT LONG TERM GOAL #3   Title  improve LE strength and endurance to walk for 5-8 minutes without fatigue    Baseline  --    Time  8    Period  Weeks    Status  New    Target Date  01/29/19      PT LONG TERM GOAL #4   Title  improve LE strength to negotiate steps with independence and use of 1 rail on the Lt to allow for visits to her daughter's house    Baseline  --  Time  8    Period  Weeks    Status  New    Target Date  01/29/19            Plan - 01/15/19 1207    Clinical Impression Statement  Pt had increased pain after performing  exercises over the weekend and so didn't do additional exercises.  PT educated pt to continue to do exercises and modify for the pain if it occurs.  Pt tolerated all exercise in the clinic without increased pain today.  Pt required stand by assistance for safety with standing exercises and verbal cues for step-up on 4 inch step.  Pt with increased ease and improved technique with step-ups today.  Pt will continue to benefit from skilled PT to address LE strength to improve endurance and functional mobility.    PT Frequency  2x / week    PT Duration  8 weeks    PT Treatment/Interventions  ADLs/Self Care Home Management;Cryotherapy;Electrical Stimulation;Ultrasound;Moist Heat;Gait training;Stair training;Functional mobility training;Therapeutic activities;Therapeutic exercise;Patient/family education;Neuromuscular re-education;Manual techniques;Passive range of motion;Taping    PT Next Visit Plan  hip and knee strength, functional mobility, hamstring flexibility, gait, work on steps    PT Home Exercise Plan  [id number]    Consulted and Agree with Plan of Care  Patient       Patient will benefit from skilled therapeutic intervention in order to improve the following deficits and impairments:  Abnormal gait, Decreased activity tolerance, Decreased mobility, Decreased range of motion, Difficulty walking, Hypomobility, Impaired flexibility, Improper body mechanics, Postural dysfunction, Pain  Visit Diagnosis: Other abnormalities of gait and mobility  Muscle weakness (generalized)     Problem List Patient Active Problem List   Diagnosis Date Noted  . Baker's cyst of knee, unspecified laterality 12/06/2018  . Anemia in neoplastic disease 04/14/2018  . Neurogenic claudication due to lumbar spinal stenosis 01/28/2018  . Hydronephrosis with ureteropelvic junction (UPJ) obstruction 08/22/2017  . DDD (degenerative disc disease), cervical 08/09/2017  . Hypercoagulable state due to atrial fibrillation  ([place]) 08/09/2017  . Presbycusis of both ears 08/03/2017  . Deficiency anemia 07/26/2017  . Pure hypercholesterolemia   . PONV (postoperative nausea and vomiting)   . PAF (paroxysmal atrial fibrillation) ([place])   . Neck mass   . Insomnia, unspecified   . History of kidney stones   . Deafness   . Closed fracture of lower end of radius with ulna   . Arthritis   . Abnormal stress test   . Rotator cuff arthropathy, right 09/08/2016  . Trigger point of right shoulder region 05/05/2016  . Spondylosis of cervical region without myelopathy or radiculopathy 05/05/2016  . Chondrocalcinosis 05/05/2016  . Chronic pain disorder 05/05/2016  . A-fib ([place]) 01/08/2016  . Closed fracture of shaft of right femur, initial encounter ([place]) 10/25/2015  . Status post-operative repair of hip fracture 10/25/2015  . Greater trochanteric bursitis of right hip 09/29/2015  . Asymptomatic cholelithiasis 05/09/2015  . Hair loss 11/07/2014  . Bilateral leg edema 11/07/2014  . Constipation due to pain medication 11/06/2014  . Hypotension due to drugs 11/06/2014  . Chest pain 09/01/2014  . Osteoporosis 01/04/2014  . Gout 01/04/2014  . Headache 01/04/2014  . Hypothyroidism 11/19/2013  . Hypercalcemia 10/11/2013  . Insomnia 05/16/2013  . Lumbar back pain 05/16/2013  . Polycythemia vera ([place])   . Mass of right side of neck   . Senile osteoporosis   . Macular degeneration   . Sick sinus syndrome ([place]) 03/09/2012  . CAD (coronary  artery disease) 12/27/2011  . Paroxysmal atrial fibrillation ([place]) 12/27/2011  . Chronic diastolic CHF (congestive heart failure) ([place]) 12/27/2011  . HTN (hypertension) 12/27/2011    Sigurd Sos, PT 01/15/19 12:27 PM PHYSICAL THERAPY DISCHARGE SUMMARY  Visits from Start of Care: 6  Current functional level related to goals / functional outcomes: Pt canceled all remaining appts due to concerns regarding rising COVID numbers.  Pt will continue with HEP for strength and endurance  gains.     Remaining deficits: Bil knee pain, functional LE weakness that limits functional mobility.     Education / Equipment: HEP Plan: Patient agrees to discharge.  Patient goals were met. Patient is being discharged due to the patient's request.  ?????      Sigurd Sos, PT 01/22/19 10:12 AM   [facility] [address], [place], [place], [address] Phone: [phone]   Fax:  [phone]  Name: Sarah Mays MRN: [id number] Date of Birth: 1930/12/17

## 2019-01-18 ENCOUNTER — Other Ambulatory Visit (HOSPITAL_COMMUNITY): Payer: Self-pay | Admitting: Cardiology

## 2019-01-18 ENCOUNTER — Ambulatory Visit (HOSPITAL_COMMUNITY)
Admission: RE | Admit: 2019-01-18 | Discharge: 2019-01-18 | Disposition: A | Discharge status: Home / Self Care | Payer: Medicare Other | Source: Ambulatory Visit | Attending: Cardiology | Admitting: Cardiology

## 2019-01-18 ENCOUNTER — Other Ambulatory Visit: Payer: Self-pay

## 2019-01-18 DIAGNOSIS — I5032 Chronic diastolic (congestive) heart failure: Secondary | ICD-10-CM

## 2019-01-18 DIAGNOSIS — I480 Paroxysmal atrial fibrillation: Secondary | ICD-10-CM | POA: Diagnosis not present

## 2019-01-18 NOTE — Patient Instructions (Signed)
You are scheduled for a nursing visit on Monday December 14th, 2020 at [place] code [id number].  You will have an EKG, lab work and Reds clip to assess fluid on your lungs  Your physician recommends that you schedule a follow-up appointment in: 3 months on March 11th, 2020 Thursday at [place]  At the [facility], you and your health needs are our priority. As part of our continuing mission to provide you with exceptional heart care, we have created designated Provider Care Teams. These Care Teams include your primary Cardiologist (physician) and Advanced Practice Providers (APPs- Physician Assistants and Nurse Practitioners) who all work together to provide you with the care you need, when you need it.   You may see any of the following providers on your designated Care Team at your next follow up: [name] Kitchen Dr Glori Bickers . Dr Loralie Champagne . Darrick Grinder, NP . Lyda Jester, PA . Audry Riles, PharmD   Please be sure to bring in all your medications bottles to every appointment.

## 2019-01-18 NOTE — Progress Notes (Signed)
AVS discussed with patient, questions answered. AVS mailed.

## 2019-01-19 ENCOUNTER — Encounter: Payer: Self-pay | Admitting: *Deleted

## 2019-01-19 NOTE — Progress Notes (Signed)
Heart Failure TeleHealth Note  Due to national recommendations of social distancing due to [place] 19, Audio/video telehealth visit is felt to be most appropriate for this patient at this time.  See MyChart message from today for patient consent regarding telehealth for [facility].  Date:  01/19/2019   ID:  Sarah Mays, DOB 03-Jan-1931, MRN [id number]  Location: Home  Provider location: [place] [facility] Type of Visit: Established patient  PCP:  Gayland Curry, DO  Cardiologist:  Loralie Champagne, MD  Chief Complaint: Shortness of breath   History of Present Illness: Sarah Mays is a 85 y.o. female who presents via audio/video conferencing for a telehealth visit today.     she denies symptoms worrisome for COVID 19.   Patient has a history of paroxysmal atrial fibrillation and chronic diastolic CHF.  In 7/16, she was admitted with chest pain.  Cardiolite showed no ischemia or infarction.  Echo in 6/17 showed EF 60-65%.  She developed problems elevated HR when in atrial fibrillation but bradycardia when in NSR.  She underwent atrial fibrillation ablation in 11/17.  She is tolerating Xarelto without melena or BRBPR.  Dr. Rayann Heman had her stop amiodarone.    Echo in 11/18 showed EF 60-65%, PASP 47 mmHg.   TEE-guided DCCV back to NSR in 6/19.  TEE showed EF 55-60% with moderate-severe MR.   She developed multiple episodes of epistaxis with Xarelto use.  We have transitioned her to apixaban 2.5 mg bid and she has not had epistaxis since then.   Echo in 6/20 showed EF 60-65%, normal RV, mild-moderate MR.   She had recurrent atrial fibrillation with DCCV to NSR in 6/20.  She then had atypical flutter with DCCV to NSR in 8/20. Symptomatically worse when in atrial fibrillation/flutter.  At last visit, she was in junctional rhythm. Zio patch x 3 days in 9/20 showed 51% atrial flutter with controlled rate.   She denies palpitations but says that her HR has been  slow.  She is taking Lasix 80 mg bid, says that exertional dyspnea "comes and goes." Sometimes she will get short of breath walking to the dining room at [employer]  No lightheadedness/dizziness.  No chest pain. No orthopnea/PND.     Labs (7/16): K 4, creatinine 0.94, calcium 10.8, LFTs normal Labs (9/16): K 4, creatinine 0.9, LDL 69, TSH normal, HCT 42.3 Labs (5/17): K 4.6, creatinine 0.94, HCT 41.5, LFTs normal. Labs (6/17): K 5, creatinine 1.04, LDL 42, HDL 101, TSH normal Labs (12/17): K 3.7, creatinine 0.76, hgb 11.2 Labs (4/18): K 4.6, creatinine 0.91 Labs (10/18): K 4.5, creatinine 0.62 Labs (12/18): hgb 13.5 Labs (1/19): K 4.3, creatinine 0.6 Labs (4/19): hgb 13.5 Labs (7/19): K 4.5, creatinine 0.5, hgb 12.1 Labs (11/19): K 3.7, creatinine 0.95 Labs (12/19): K 3.6, creatinine 0.9, hgb 11.6 Labs (6/20): K 3.7, creatinine 1.01 => 1.17 Labs (9/20): K 4.1, creatinine 1.17  PMH: 1. Atrial fibrillation/flutter: Diagnosed initially in 10/13.  Holter monitor in 11/13 showed atrial fibrillation with average rate 65.  - Atrial fibrillation ablation in 11/17.  - TEE-guided DCCV in 6/19.  - atrial fibrillation with DCCV to NSR in 6/20.  - atypical atrial flutter with DCCV to NSR in 8/20.  - Zio patch x 3 days (9/20): 51% atrial flutter 2. Chronic diastolic CHF: Echo ([id number]) with EF 65-70%, mild MR, moderate biatrial enlargement, moderate TR, PA systolic pressure 45 mmHg.   Echo (10/15): EF 60-65%.  Echo (7/16)  with EF 60-65%, mild MR.  - Echo (6/17): EF 60-65%, PASP 41 mmHg.  - Echo (11/18): EF 60-65%, PASP 47 mmHg - TEE (6/19): EF 55-60%, mild LVH, mildly decreased RV systolic function, peak RV-RA gradient 40 mmHg, moderate-severe MR with bileaflet MVP, ERO 0.37 cm^2.  - Echo (6/20): EF 60-65%, normal RV, mild-moderate MR, severe biatrial enlargement.  3. ETT-Sestamibi (11/13): Exercised to stage II, small partially reversible anteroapical perfusion defect suggesting mild ischemia  versus attenuation, EF 80%.  Cardiolite (7/16) with EF 64%, no ischemia or infarction.  4. HTN: Lower extremity swelling with amlodipine.  5. Polycythemia vera: Has had phlebotomy only once.  6. Hypothyroidism 7. Macular degeneration 8. TAH 1980 9. Familial hypocalciuric hypercalcemia 10. PFTs (amiodarone use) in 1/14 showed a mild obstructive defect.  11. Degenerative disc disease 12. Sick sinus syndrome: Has not required PPM. Junctional rhythm has been noted in the past.  13. Mitral regurgitation: TEE (6/19) with moderate-severe MR with bileaflet MVP, ERO 0.37 cm^2.  - Echo (6/20) with mild-moderate MR.   Current Outpatient Medications  Medication Sig Dispense Refill  . amiodarone (PACERONE) 200 MG tablet Take 1 tablet (200 mg total) by mouth daily. 90 tablet 3  . apixaban (ELIQUIS) 2.5 MG TABS tablet Take 1 tablet (2.5 mg total) by mouth 2 (two) times daily. 180 tablet 3  . Cholecalciferol (VITAMIN D) 2000 units CAPS Take 2,000 Units by mouth daily.     . fentaNYL (DURAGESIC) 12 MCG/HR Place 1 patch onto the skin every 3 (three) days. 10 patch 0  . furosemide (LASIX) 80 MG tablet Take 1 tablet (80 mg total) by mouth 2 (two) times daily. 60 tablet 6  . gabapentin (NEURONTIN) 100 MG capsule Take 100 mg by mouth daily.    . hydrALAZINE (APRESOLINE) 50 MG tablet TAKE 1 1/2 TABLETS THREE TIMES DAILY. 135 tablet 0  . hydroxyurea (HYDREA) 500 MG capsule TAKE 1 CAPSULE ON MONDAY, WEDNESDAY,AND FRIDAY, AND 2 CAPSULES THE OTHER DAYS OF THE WEEK. 100 capsule 0  . levothyroxine (SYNTHROID) 100 MCG tablet TAKE 1 TABLET ONCE DAILY BEFORE BREAKFAST. 90 tablet 1  . Magnesium 250 MG TABS Take 250 mg by mouth daily.     . Melatonin 10 MG TABS Take 10 mg by mouth at bedtime.    . Multiple Vitamins-Minerals (PRESERVISION AREDS 2) CAPS Take 1 capsule by mouth 2 (two) times daily.     . potassium chloride (KLOR-CON) 10 MEQ tablet TAKE 2 TABLETS IN THE MORNING AND 1 TABLET IN THE EVENING. 90 tablet 6  .  sennosides-docusate sodium (SENOKOT-S) 8.6-50 MG tablet Take 2 tablets by mouth 2 (two) times daily.    . sodium chloride (OCEAN) 0.65 % SOLN nasal spray Place 2 sprays into both nostrils 2 (two) times daily.     . traMADol (ULTRAM) 50 MG tablet TAKE ONE TABLET AT BEDTIME. 30 tablet 0   No current facility-administered medications for this encounter.    Allergies:   Amlodipine and Augmentin [amoxicillin-pot clavulanate]   Social History:  The patient  reports that she quit smoking about 35 years ago. Her smoking use included cigarettes. She has never used smokeless tobacco. She reports previous alcohol use of about 1.0 standard drinks of alcohol per week. She reports that she does not use drugs.   Family History:  The patient's family history includes Breast cancer in her daughter; Cancer in her daughter, sister, sister, and sister; Heart disease in her father and sister; Hypertension in her father; Ovarian cancer (age  of onset: 34) in her mother.   ROS:  Please see the history of present illness.   All other systems are personally reviewed and negative.   Exam:   (Video/Tele Health Call; Exam is subjective and or/visual.) General:  Speaks in full sentences. No resp difficulty. Lungs: Normal respiratory effort with conversation.  Abdomen: Non-distended per patient report Extremities: Pt denies edema. Neuro: Alert & oriented x 3.   Recent Labs: 10/13/2018: ALT 17; BUN 24; Creatinine, Ser 1.17; Magnesium 2.4; Potassium 4.1; Sodium 137; TSH 3.622 12/22/2018: Hemoglobin 12.3; Platelets 423  Personally reviewed   Wt Readings from Last 3 Encounters:  12/22/18 49.2 kg (108 lb 6.4 oz)  12/06/18 49 kg (108 lb)  11/06/18 49.3 kg (108 lb 9.6 oz)      ASSESSMENT AND PLAN:  1. Atrial fibrillation/flutter: First noted in 10/13.  Atrial fibrillation has triggered acute on chronic diastolic CHF in the past.  CHADSVASC score is 13 (age, female gender, HTN, CHF).  She is anticoagulated with apixaban  2.5 mg bid which is appropriate for her age/weight.  She had atrial fibrillation ablation in 11/17.  TEE-guided DCCV in 6/19.  Cardioversion in 6/20 for atrial fibrillation and again in 8/20 for atypical atrial flutter.  Zio patch in 9/20 showed 51% atrial flutter, at last visit she was in a junctional rhythm.  She says that her HR has been slow recently (around 61), ?junctional rhythm.  - Continue amiodarone 200 mg daily.  Arrrange for CMET and TSH.  She will need a regular eye exam.   - Continue apixaban 2.5 mg bid.  - I will have her get an ECG to assess rhythm.   - I will have her followup with EP, if she is in flutter again on ECG, ?repeat ablation.  2. Chronic diastolic CHF:  Echo was done in 6/20 with EF 60-65%, normal RV, mild-moderate MR.  Mildly increased dyspnea recently, ?related to arrhythmias.  - Continue Lasix 80 mg bid. Arrange for BMET.     - Continue KCl 20 qam/10 qpm.  - When she comes for ECG and labs, I will get a REDS clip measurement.  3. HTN: BP has been reasonably controlled, continue current meds.     4. Bradycardia: Suspect she has a degree of sick sinus syndrome. This has been stable off nodal blocking agents.  She has been in a junctional rhythm in the past, no lightheadedness. - As above, coming for an ECG.  5. Mitral regurgitation: Moderate to severe MR in setting of bileaflet prolapse on TEE in 6/19. However, echo in 6/20 showed only mild-moderate MR.   COVID screen The patient does not have any symptoms that suggest any further testing/ screening at this time.  Social distancing reinforced today.  Patient Risk: After full review of this patients clinical status, I feel that they are at moderate risk for cardiac decompensation at this time.  Relevant cardiac medications were reviewed at length with the patient today. The patient does not have concerns regarding their medications at this time.   Recommended follow-up:  3 months  Today, I have spent 18 minutes  with the patient with telehealth technology discussing the above issues .    Signed, Loralie Champagne, MD  01/19/2019  [place] [address] [facility] [place] [address] [phone] (office) [phone] (fax)

## 2019-01-20 ENCOUNTER — Other Ambulatory Visit: Payer: Self-pay | Admitting: Hematology and Oncology

## 2019-01-22 ENCOUNTER — Ambulatory Visit (HOSPITAL_COMMUNITY)
Admission: RE | Admit: 2019-01-22 | Discharge: 2019-01-22 | Disposition: A | Discharge status: Home / Self Care | Payer: Medicare Other | Source: Ambulatory Visit | Attending: Cardiology | Admitting: Cardiology

## 2019-01-22 ENCOUNTER — Other Ambulatory Visit: Payer: Self-pay

## 2019-01-22 VITALS — HR 88 | Wt 107.0 lb

## 2019-01-22 DIAGNOSIS — I444 Left anterior fascicular block: Secondary | ICD-10-CM | POA: Diagnosis not present

## 2019-01-22 DIAGNOSIS — I4510 Unspecified right bundle-branch block: Secondary | ICD-10-CM | POA: Insufficient documentation

## 2019-01-22 DIAGNOSIS — R9431 Abnormal electrocardiogram [ECG] [EKG]: Secondary | ICD-10-CM | POA: Insufficient documentation

## 2019-01-22 DIAGNOSIS — I480 Paroxysmal atrial fibrillation: Secondary | ICD-10-CM | POA: Insufficient documentation

## 2019-01-22 DIAGNOSIS — I491 Atrial premature depolarization: Secondary | ICD-10-CM | POA: Diagnosis not present

## 2019-01-22 DIAGNOSIS — I4892 Unspecified atrial flutter: Secondary | ICD-10-CM | POA: Insufficient documentation

## 2019-01-22 DIAGNOSIS — I5032 Chronic diastolic (congestive) heart failure: Secondary | ICD-10-CM | POA: Insufficient documentation

## 2019-01-22 LAB — BASIC METABOLIC PANEL
Anion gap: 12 (ref 5–15)
BUN: 25 mg/dL — ABNORMAL HIGH (ref 8–23)
CO2: 29 mmol/L (ref 22–32)
Calcium: 10.2 mg/dL (ref 8.9–10.3)
Chloride: 98 mmol/L (ref 98–111)
Creatinine, Ser: 1.34 mg/dL — ABNORMAL HIGH (ref 0.44–1.00)
GFR calc Af Amer: 41 mL/min — ABNORMAL LOW (ref >60)
GFR calc non Af Amer: 36 mL/min — ABNORMAL LOW (ref >60)
Glucose, Bld: 77 mg/dL (ref 70–99)
Potassium: 4 mmol/L (ref 3.5–5.1)
Sodium: 139 mmol/L (ref 135–145)

## 2019-01-22 LAB — BRAIN NATRIURETIC PEPTIDE: B Natriuretic Peptide: 534.5 pg/mL — ABNORMAL HIGH (ref 0.0–100.0)

## 2019-01-22 NOTE — Progress Notes (Signed)
[facility] [place], [place] [address] Phone: [phone] Subjective:    I'm seeing this patient by the request  of:    CC: Left knee pain follow-up  [id number]   12/06/2018 Bilateral aspirations done today.  Patient does have underlying arthritic changes.  Discussed icing regimen and home exercise, patient will continue same medications, could be a candidate for viscosupplementation if needed.  Follow-up again in 4 to 8 weeks  Update 01/23/2019 Sarah Mays is a 85 y.o. female coming in with complaint of bilateral knee pain. Patient states that she has been doing well since last visit. Aspiration helped to alleviate her pain. Has had pain since then on and off. No pain today.    Past Medical History:  Diagnosis Date  . Abnormal stress test    a. 11/2011 Ex MV: EF 80%, small, partially reversible anteroapical defect->mild ischemia vs attenuation-->med Rx.  [name] Kitchen Alopecia, unspecified   . Arthritis   . Chronic diastolic CHF (congestive heart failure) ([place])    a. 11/2013 Echo: EF 60-65%, no rwma, mild TR, PASP 2mmHg.  . Closed fracture of lower end of radius with ulna   . Deafness    Left, s/p multiple surgeries  . Essential hypertension   . Gouty arthropathy, unspecified   . History of kidney stones   . Hypothyroidism   . Insomnia, unspecified   . Macular degeneration    Left, s/p inj. tx  . Mitral valve disorders(424.0)   . Neck mass    right, w/u including MRI negative  . PAF (paroxysmal atrial fibrillation) (HCC)    a. on amio (02/2012 mild obstruction on PFT's) and xarelto.  . Polycythemia vera(238.4)   . Pure hypercholesterolemia   . Senile osteoporosis    Reclast in the past  . Tricuspid valve disorders, specified as nonrheumatic    Past Surgical History:  Procedure Laterality Date  . BREAST BIOPSY  06/18/1998   left  . CARDIOVERSION N/A 07/15/2017   Procedure: CARDIOVERSION;  Surgeon: Larey Dresser, MD;   Location: [facility] ENDOSCOPY;  Service: Cardiovascular;  Laterality: N/A;  . CARDIOVERSION N/A 07/21/2018   Procedure: CARDIOVERSION;  Surgeon: Larey Dresser, MD;  Location: [facility] ENDOSCOPY;  Service: Cardiovascular;  Laterality: N/A;  . CARDIOVERSION N/A 10/06/2018   Procedure: CARDIOVERSION;  Surgeon: Larey Dresser, MD;  Location: [facility] ENDOSCOPY;  Service: Cardiovascular;  Laterality: N/A;  . ELECTROPHYSIOLOGIC STUDY N/A 01/08/2016   Procedure: Atrial Fibrillation Ablation;  Surgeon: Thompson Grayer, MD;  Location: [facility] CV LAB;  Service: Cardiovascular;  Laterality: N/A;  . FEMUR IM NAIL Right 10/08/2015   Procedure: INTRAMEDULLARY (IM) NAIL FEMORAL RIGHT;  Surgeon: Paralee Cancel, MD;  Location: [facility] ORS;  Service: Orthopedics;  Laterality: Right;  . MASS EXCISION Right 11/26/2016   Procedure: EXCISION RIGHT LATERAL NECK MASS;  Surgeon: Jerrell Belfast, MD;  Location: [facility];  Service: ENT;  Laterality: Right;  . TEE WITHOUT CARDIOVERSION N/A 07/15/2017   Procedure: TRANSESOPHAGEAL ECHOCARDIOGRAM (TEE);  Surgeon: Larey Dresser, MD;  Location: [facility] ENDOSCOPY;  Service: Cardiovascular;  Laterality: N/A;  . TONSILLECTOMY    . TOTAL ABDOMINAL HYSTERECTOMY    . TYMPANOPLASTY Bilateral    Social History   Socioeconomic History  . Marital status: Married    Spouse name: [name]  . Number of children: 2  . Years of education: 43  . Highest education level: Not on file  Occupational History  . Not on file  Tobacco Use  .  Smoking status: Former Smoker    Types: Cigarettes    Quit date: 01/19/1984    Years since quitting: 35.0  . Smokeless tobacco: Never Used  Substance and Sexual Activity  . Alcohol use: Not Currently    Alcohol/week: 1.0 standard drinks    Types: 1 Glasses of wine per week    Comment: 2 per day/ 14 a week  . Drug use: No  . Sexual activity: Not Currently  Other Topics Concern  . Not on file  Social History Narrative   Patient is Married 1955. 2 kids. 58 grandkid-33 years old  in 2016. College graduate [name] [place]).    Lives in apartment,  Independent Living  section at [place] since 02/2013.      Stay at home mother.       No Smoking history, Mod. alcohol use.   Patient has a living will, POA      Hobbies: CPU and painting         Social Determinants of Health   Financial Resource Strain:   . Difficulty of Paying Living Expenses: Not on file  Food Insecurity:   . Worried About Charity fundraiser in the Last Year: Not on file  . Ran Out of Food in the Last Year: Not on file  Transportation Needs:   . Lack of Transportation (Medical): Not on file  . Lack of Transportation (Non-Medical): Not on file  Physical Activity:   . Days of Exercise per Week: Not on file  . Minutes of Exercise per Session: Not on file  Stress:   . Feeling of Stress : Not on file  Social Connections:   . Frequency of Communication with Friends and Family: Not on file  . Frequency of Social Gatherings with Friends and Family: Not on file  . Attends Religious Services: Not on file  . Active Member of Clubs or Organizations: Not on file  . Attends Archivist Meetings: Not on file  . Marital Status: Not on file   Allergies  Allergen Reactions  . Amlodipine Swelling and Other (See Comments)    Lower extremity swelling   . Augmentin [Amoxicillin-Pot Clavulanate] Other (See Comments)    Stomach Pain/cramps Did it involve swelling of the face/tongue/throat, SOB, or low BP? No Did it involve sudden or severe rash/hives, skin peeling, or any reaction on the inside of your mouth or nose? No Did you need to seek medical attention at a hospital or doctor's office? Unknown When did it last happen?unknown If all above answers are "NO", may proceed with cephalosporin use.    Family History  Problem Relation Age of Onset  . Hypertension Father   . Heart disease Father        patient does not know details.   . Ovarian cancer Mother 64    . Cancer Sister        colon  . Heart disease Sister   . Cancer Sister   . Cancer Sister        hodgkin disease  . Breast cancer Daughter   . Cancer Daughter        breast    Current Outpatient Medications (Endocrine & Metabolic):  .  levothyroxine (SYNTHROID) 100 MCG tablet, TAKE 1 TABLET ONCE DAILY BEFORE BREAKFAST.  Current Outpatient Medications (Cardiovascular):  .  amiodarone (PACERONE) 200 MG tablet, Take 1 tablet (200 mg total) by mouth daily. .  furosemide (LASIX) 80 MG tablet, Take 1 tablet (80 mg total)  by mouth 2 (two) times daily. .  hydrALAZINE (APRESOLINE) 50 MG tablet, TAKE 1 1/2 TABLETS THREE TIMES DAILY.  Current Outpatient Medications (Respiratory):  .  sodium chloride (OCEAN) 0.65 % SOLN nasal spray, Place 2 sprays into both nostrils 2 (two) times daily.   Current Outpatient Medications (Analgesics):  .  fentaNYL (DURAGESIC) 12 MCG/HR, Place 1 patch onto the skin every 3 (three) days. .  traMADol (ULTRAM) 50 MG tablet, TAKE ONE TABLET AT BEDTIME.  Current Outpatient Medications (Hematological):  .  apixaban (ELIQUIS) 2.5 MG TABS tablet, Take 1 tablet (2.5 mg total) by mouth 2 (two) times daily.  Current Outpatient Medications (Other):  [name] Kitchen  Cholecalciferol (VITAMIN D) 2000 units CAPS, Take 2,000 Units by mouth daily.  [name] Kitchen  gabapentin (NEURONTIN) 100 MG capsule, Take 100 mg by mouth daily. .  hydroxyurea (HYDREA) 500 MG capsule, TAKE 1 CAPSULE ON MONDAY, WEDNESDAY,AND FRIDAY, AND 2 CAPSULES THE OTHER DAYS OF THE WEEK. .  Magnesium 250 MG TABS, Take 250 mg by mouth daily.  .  Melatonin 10 MG TABS, Take 10 mg by mouth at bedtime. .  Multiple Vitamins-Minerals (PRESERVISION AREDS 2) CAPS, Take 1 capsule by mouth 2 (two) times daily.  .  potassium chloride (KLOR-CON) 10 MEQ tablet, TAKE 2 TABLETS IN THE MORNING AND 1 TABLET IN THE EVENING. .  sennosides-docusate sodium (SENOKOT-S) 8.6-50 MG tablet, Take 2 tablets by mouth 2 (two) times daily.    Past medical  history, social, surgical and family history all reviewed in electronic medical record.  No pertanent information unless stated regarding to the chief complaint.   Review of Systems:  No headache, visual changes, nausea, vomiting, diarrhea, constipation, dizziness, abdominal pain, skin rash, fevers, chills, night sweats, weight loss, swollen lymph nodes, body aches, joint swelling, , chest pain, shortness of breath, mood changes.  Positive muscle aches  Objective  Blood pressure 120/60, pulse 75, height 5' (1.524 m), weight 110 lb (49.9 kg), SpO2 95 %. Syste   General: No apparent distress alert and oriented x3 mood and affect normal, dressed appropriately.  HEENT: Pupils equal, extraocular movements intact  Antalgic gait walking with the aid of a walker.  Significant scoliosis noted of the lumbar spine.  Patient's knees bilaterally do show some arthritic changes mostly in the medial compartment with some mild instability and some mild abnormal swelling still noted.  Patient no Baker's cyst is gone in the near full range of motion of the knees.    Impression and Recommendations:     . The above documentation has been reviewed and is accurate and complete Lyndal Pulley, DO       Note: This dictation was prepared with Dragon dictation along with smaller phrase technology. Any transcriptional errors that result from this process are unintentional.

## 2019-01-22 NOTE — Addendum Note (Signed)
Encounter addended by: Harvie Junior, CMA on: 01/22/2019 2:49 PM  Actions taken: Charge Capture section accepted

## 2019-01-22 NOTE — Progress Notes (Signed)
Patient seen in office for nurse visit. Per Dr.McLean patient needs an EKG, labs, and reds clip.  EKG showed a flutter, labs drawn, and reds clip was 24%.  Patient states she feels great/no complaints. Per Dr.McLean refer to EP for ablation. Pt aware and agreeable with plan. I informed her that EP would contact her to schedule appointment.

## 2019-01-23 ENCOUNTER — Other Ambulatory Visit: Payer: Self-pay

## 2019-01-23 ENCOUNTER — Other Ambulatory Visit: Payer: Self-pay | Admitting: Internal Medicine

## 2019-01-23 ENCOUNTER — Ambulatory Visit (INDEPENDENT_AMBULATORY_CARE_PROVIDER_SITE_OTHER): Payer: Medicare Other | Admitting: Family Medicine

## 2019-01-23 ENCOUNTER — Encounter: Payer: Self-pay | Admitting: Family Medicine

## 2019-01-23 DIAGNOSIS — M545 Low back pain, unspecified: Secondary | ICD-10-CM

## 2019-01-23 DIAGNOSIS — M7120 Synovial cyst of popliteal space [Baker], unspecified knee: Secondary | ICD-10-CM

## 2019-01-23 MED ORDER — FENTANYL 12 MCG/HR TD PT72: 1.0000 | MEDICATED_PATCH | TRANSDERMAL | 0 refills | Status: DC

## 2019-01-23 NOTE — Patient Instructions (Signed)
  [address] 1st floor [place], [place] [address] Phone [phone]

## 2019-01-23 NOTE — Assessment & Plan Note (Signed)
Patient is doing much better after the aspiration.  Has been doing formal physical therapy but encouraged her to discontinue secondary to the coronavirus outbreak at the moment.  Patient will continue with conservative therapy.  With the underlying arthritis patient could be a candidate for viscosupplementation and we can consider getting approval if necessary.  As long as patient does well follow-up with me as needed

## 2019-01-23 NOTE — Telephone Encounter (Signed)
RX last filled in epic on 12/25/2018  Pending appointment in January to update controlled substance contract

## 2019-01-23 NOTE — Telephone Encounter (Signed)
Last filled 12/10/2018

## 2019-01-24 ENCOUNTER — Encounter: Payer: Self-pay | Admitting: Internal Medicine

## 2019-01-31 ENCOUNTER — Other Ambulatory Visit (HOSPITAL_COMMUNITY): Payer: Self-pay | Admitting: Cardiology

## 2019-02-12 ENCOUNTER — Other Ambulatory Visit (HOSPITAL_COMMUNITY): Payer: Self-pay | Admitting: *Deleted

## 2019-02-12 ENCOUNTER — Encounter (HOSPITAL_COMMUNITY): Payer: Self-pay

## 2019-02-12 DIAGNOSIS — I480 Paroxysmal atrial fibrillation: Secondary | ICD-10-CM

## 2019-02-14 ENCOUNTER — Non-Acute Institutional Stay: Payer: Medicare Other | Admitting: Internal Medicine

## 2019-02-14 ENCOUNTER — Other Ambulatory Visit: Payer: Self-pay

## 2019-02-14 VITALS — BP 110/60 | HR 52 | Temp 97.9°F | Ht 60.0 in | Wt 110.0 lb

## 2019-02-14 DIAGNOSIS — I480 Paroxysmal atrial fibrillation: Secondary | ICD-10-CM

## 2019-02-14 DIAGNOSIS — K5903 Drug induced constipation: Secondary | ICD-10-CM | POA: Diagnosis not present

## 2019-02-14 DIAGNOSIS — Z66 Do not resuscitate: Secondary | ICD-10-CM

## 2019-02-14 DIAGNOSIS — I5032 Chronic diastolic (congestive) heart failure: Secondary | ICD-10-CM | POA: Diagnosis not present

## 2019-02-14 DIAGNOSIS — M545 Low back pain, unspecified: Secondary | ICD-10-CM

## 2019-02-14 NOTE — Progress Notes (Signed)
Location:  Occupational psychologist of Service:  Clinic (12)  Provider: Adwoa Axe L. Mariea Clonts, D.O., C.M.D.  Code Status: DNR Goals of Care:  Advanced Directives 12/04/2018  Does Patient Have a Medical Advance Directive? Yes  Type of Paramedic of [place];Living will  Does patient want to make changes to medical advance directive? No - Patient declined  Copy of [place] in Chart? No - copy requested  Would patient like information on creating a medical advance directive? -  Pre-existing out of facility DNR order (yellow form or pink MOST form) -     Chief Complaint  Patient presents with  . Medical Management of Chronic Issues    52month follow up/ narcotic contract     HPI: Patient is a 86 y.o. female seen today for medical management of chronic diseases.    She is doing ok except her left ear is bothering her.  It hurts on and off since 2 days ago.  She's not noticing a difference in hearing.  No ringing in ear.  No congestion, sore throat, cough.    Afib:  Recently running bradycardic.  She has a f/u with Dr. Rayann Heman.  HR 60.    She is not bothered by her back except if she's up and standing or walking too long.  It's bothering her some today b/c of being active this morning. She avoids using the tramadol and has been on chronic fentanyl for her pain.  We've lowered this as much as she tolerates.  We renewed her annual opioid controlled substance contract.    We discussed her husband's declining cognition.  She says it's sad.  He's having a lot more difficulty remembering after he has HD.  She has to reorient him again and again and staff actually had to bring him to the dining area b/c he was so confused recently.  We also spoke about his need to change positions to prevent his redness from worsening on his bottom.     Past Medical History:  Diagnosis Date  . Abnormal stress test    a. 11/2011 Ex MV: EF 80%, small,  partially reversible anteroapical defect->mild ischemia vs attenuation-->med Rx.  [name] Kitchen Alopecia, unspecified   . Arthritis   . Chronic diastolic CHF (congestive heart failure) ([place])    a. 11/2013 Echo: EF 60-65%, no rwma, mild TR, PASP 55mmHg.  . Closed fracture of lower end of radius with ulna   . Deafness    Left, s/p multiple surgeries  . Essential hypertension   . Gouty arthropathy, unspecified   . History of kidney stones   . Hypothyroidism   . Insomnia, unspecified   . Macular degeneration    Left, s/p inj. tx  . Mitral valve disorders(424.0)   . Neck mass    right, w/u including MRI negative  . PAF (paroxysmal atrial fibrillation) (HCC)    a. on amio (02/2012 mild obstruction on PFT's) and xarelto.  . Polycythemia vera(238.4)   . Pure hypercholesterolemia   . Senile osteoporosis    Reclast in the past  . Tricuspid valve disorders, specified as nonrheumatic     Past Surgical History:  Procedure Laterality Date  . BREAST BIOPSY  06/18/1998   left  . CARDIOVERSION N/A 07/15/2017   Procedure: CARDIOVERSION;  Surgeon: Larey Dresser, MD;  Location: [facility] ENDOSCOPY;  Service: Cardiovascular;  Laterality: N/A;  . CARDIOVERSION N/A 07/21/2018   Procedure: CARDIOVERSION;  Surgeon: Larey Dresser, MD;  Location: [facility] ENDOSCOPY;  Service: Cardiovascular;  Laterality: N/A;  . CARDIOVERSION N/A 10/06/2018   Procedure: CARDIOVERSION;  Surgeon: Larey Dresser, MD;  Location: [facility] ENDOSCOPY;  Service: Cardiovascular;  Laterality: N/A;  . ELECTROPHYSIOLOGIC STUDY N/A 01/08/2016   Procedure: Atrial Fibrillation Ablation;  Surgeon: Thompson Grayer, MD;  Location: [facility] CV LAB;  Service: Cardiovascular;  Laterality: N/A;  . FEMUR IM NAIL Right 10/08/2015   Procedure: INTRAMEDULLARY (IM) NAIL FEMORAL RIGHT;  Surgeon: Paralee Cancel, MD;  Location: [facility] ORS;  Service: Orthopedics;  Laterality: Right;  . MASS EXCISION Right 11/26/2016   Procedure: EXCISION RIGHT LATERAL NECK MASS;  Surgeon: Jerrell Belfast, MD;  Location: [facility];  Service: ENT;  Laterality: Right;  . TEE WITHOUT CARDIOVERSION N/A 07/15/2017   Procedure: TRANSESOPHAGEAL ECHOCARDIOGRAM (TEE);  Surgeon: Larey Dresser, MD;  Location: [facility] ENDOSCOPY;  Service: Cardiovascular;  Laterality: N/A;  . TONSILLECTOMY    . TOTAL ABDOMINAL HYSTERECTOMY    . TYMPANOPLASTY Bilateral     Allergies  Allergen Reactions  . Amlodipine Swelling and Other (See Comments)    Lower extremity swelling   . Augmentin [Amoxicillin-Pot Clavulanate] Other (See Comments)    Stomach Pain/cramps Did it involve swelling of the face/tongue/throat, SOB, or low BP? No Did it involve sudden or severe rash/hives, skin peeling, or any reaction on the inside of your mouth or nose? No Did you need to seek medical attention at a hospital or doctor's office? Unknown When did it last happen?unknown If all above answers are "NO", may proceed with cephalosporin use.     Outpatient Encounter Medications as of 02/14/2019  Medication Sig  . amiodarone (PACERONE) 200 MG tablet Take 1 tablet (200 mg total) by mouth daily.  [name] Kitchen apixaban (ELIQUIS) 2.5 MG TABS tablet Take 1 tablet (2.5 mg total) by mouth 2 (two) times daily.  . Cholecalciferol (VITAMIN D) 2000 units CAPS Take 2,000 Units by mouth daily.   . fentaNYL (DURAGESIC) 12 MCG/HR Place 1 patch onto the skin every 3 (three) days.  . furosemide (LASIX) 80 MG tablet Take 1 tablet (80 mg total) by mouth 2 (two) times daily.  [name] Kitchen gabapentin (NEURONTIN) 100 MG capsule TAKE THREE (3) CAPSULES AT BEDTIME.  . hydrALAZINE (APRESOLINE) 50 MG tablet TAKE 1 1/2 TABLETS THREE TIMES DAILY.  . hydroxyurea (HYDREA) 500 MG capsule TAKE 1 CAPSULE ON MONDAY, WEDNESDAY,AND FRIDAY, AND 2 CAPSULES THE OTHER DAYS OF THE WEEK.  [name] Kitchen levothyroxine (SYNTHROID) 100 MCG tablet TAKE 1 TABLET ONCE DAILY BEFORE BREAKFAST.  . Magnesium 250 MG TABS Take 250 mg by mouth daily.   . Melatonin 10 MG TABS Take 10 mg by mouth at bedtime.  . Multiple  Vitamins-Minerals (PRESERVISION AREDS 2) CAPS Take 1 capsule by mouth 2 (two) times daily.   . potassium chloride (KLOR-CON) 10 MEQ tablet TAKE 2 TABLETS IN THE MORNING AND 1 TABLET IN THE EVENING.  . sennosides-docusate sodium (SENOKOT-S) 8.6-50 MG tablet Take 2 tablets by mouth 2 (two) times daily.  . sodium chloride (OCEAN) 0.65 % SOLN nasal spray Place 2 sprays into both nostrils 2 (two) times daily.   . traMADol (ULTRAM) 50 MG tablet TAKE ONE TABLET AT BEDTIME.   No facility-administered encounter medications on file as of 02/14/2019.    Review of Systems:  Review of Systems  Constitutional: Negative for chills, fever and malaise/fatigue.  HENT: Positive for hearing loss. Negative for congestion and sore throat.   Eyes:       Vision declining  Respiratory: Negative  for cough and shortness of breath.   Cardiovascular: Negative for chest pain, palpitations and leg swelling.  Gastrointestinal: Positive for constipation. Negative for abdominal pain, blood in stool, diarrhea and melena.       Bowels moving better with fiber supplement suggested by clinic nurse--reports she is hydrating  Genitourinary: Negative for dysuria.  Musculoskeletal: Positive for back pain. Negative for falls.       Using rolling walker with skis  Skin: Negative for itching and rash.  Neurological: Negative for dizziness and loss of consciousness.  Endo/Heme/Allergies: Bruises/bleeds easily.  Psychiatric/Behavioral: Negative for depression and memory loss. The patient is not nervous/anxious and does not have insomnia.     Health Maintenance  Topic Date Due  . TETANUS/TDAP  01/13/2027  . INFLUENZA VACCINE  Completed  . DEXA SCAN  Completed  . PNA vac Low Risk Adult  Completed    Physical Exam: Vitals:   02/14/19 1144  BP: 110/60  Pulse: (!) 52  Temp: 97.9 F (36.6 C)  TempSrc: Oral  SpO2: 95%  Weight: 110 lb (49.9 kg)  Height: 5' (1.524 m)   Body mass index is 21.48 kg/m. Physical Exam Vitals  reviewed.  Constitutional:      Appearance: Normal appearance.  HENT:     Head: Normocephalic and atraumatic.  Cardiovascular:     Rate and Rhythm: Regular rhythm. Bradycardia present.     Heart sounds: Murmur present.  Neurological:     Mental Status: She is alert.     Labs reviewed: Basic Metabolic Panel: Recent Labs    08/07/18 1501 10/06/18 0708 10/13/18 1144 01/22/19 1430  NA 138 141 137 139  K 4.1 3.9 4.1 4.0  CL 99 98 101 98  CO2 32  --  27 29  GLUCOSE 76 84 79 77  BUN 24* 43* 24* 25*  CREATININE 1.17* 1.20* 1.17* 1.34*  CALCIUM 10.9*  --  10.1 10.2  MG  --   --  2.4  --   TSH  --   --  3.622  --    Liver Function Tests: Recent Labs    10/13/18 1144  AST 15  ALT 17  ALKPHOS 44  BILITOT 0.6  PROT 6.4*  ALBUMIN 4.2   No results for input(s): LIPASE, AMYLASE in the last 8760 hours. No results for input(s): AMMONIA in the last 8760 hours. CBC: Recent Labs    04/14/18 1205 08/14/18 1305 10/06/18 0708 10/13/18 1144 12/22/18 1036  WBC 7.1 7.1  --  6.8 9.8  NEUTROABS 5.1 5.2  --   --  7.8*  HGB 11.6* 11.5* 13.9 12.1 12.3  HCT 35.9* 35.8* 41.0 37.1 37.4  MCV 114.3* 114.7*  --  119.7* 117.6*  PLT 410* 360  --  527* 423*    Assessment/Plan 1. Lumbar back pain -continue her regular regimen of fentanyl and tramadol which has been effective and injections when needed for breakthrough pain  2. Constipation due to pain medication -doing well using fiber supplement, hydration encouraged  3. Paroxysmal atrial fibrillation (HCC) -has been in sinus as of late, on amiodarone, eliquis anticoagulation low dose with age and weight  4. Chronic diastolic CHF (congestive heart failure) (HCC) -euvolemic, continues on lasix 80mg  po bid and potassium 55meq in am and 63meq in pm  5. DNR (do not resuscitate) -order reentered for the year--wishes have not changed - DNR (Do Not Resuscitate)  Labs/tests ordered:  No new  Next appt:  6 mos med mgt; gets labs with  cardiology frequently  Karishma Unrein L. Jenniffer Vessels, D.O. [facility] [address], [place] [address] Cell Phone (Mon-Fri 8am-5pm):  [phone] On Call:  [phone] & follow prompts after 5pm & weekends Office Phone:  [phone] Office Fax:  [phone]

## 2019-02-15 ENCOUNTER — Other Ambulatory Visit: Payer: Self-pay | Admitting: Internal Medicine

## 2019-02-15 DIAGNOSIS — M8000XD Age-related osteoporosis with current pathological fracture, unspecified site, subsequent encounter for fracture with routine healing: Secondary | ICD-10-CM

## 2019-02-15 MED ORDER — DENOSUMAB 60 MG/ML ~~LOC~~ SOSY: 60.0000 mg | PREFILLED_SYRINGE | Freq: Once | SUBCUTANEOUS | 0 refills | Status: AC

## 2019-02-15 NOTE — Progress Notes (Signed)
Patient has been getting prolia at [facility], but per [facility] administrator, must now have prolia ordered through pharmacy and administered by [facility] nurse.  Rx sent to [facility].

## 2019-02-17 ENCOUNTER — Other Ambulatory Visit: Payer: Self-pay | Admitting: Internal Medicine

## 2019-02-19 ENCOUNTER — Encounter: Payer: Self-pay | Admitting: Internal Medicine

## 2019-02-20 DIAGNOSIS — Z23 Encounter for immunization: Secondary | ICD-10-CM | POA: Diagnosis not present

## 2019-02-22 ENCOUNTER — Other Ambulatory Visit: Payer: Self-pay | Admitting: *Deleted

## 2019-02-22 DIAGNOSIS — M545 Low back pain, unspecified: Secondary | ICD-10-CM

## 2019-02-22 MED ORDER — FENTANYL 12 MCG/HR TD PT72: 1.0000 | MEDICATED_PATCH | TRANSDERMAL | 0 refills | Status: DC

## 2019-02-22 NOTE — Telephone Encounter (Signed)
Patient requested refill [place] Database Verified LR: 01/23/2019 Pended Rx and sent to Dr. Mariea Clonts for approval.

## 2019-02-23 ENCOUNTER — Other Ambulatory Visit: Payer: Self-pay | Admitting: Internal Medicine

## 2019-02-23 DIAGNOSIS — M545 Low back pain, unspecified: Secondary | ICD-10-CM

## 2019-02-27 ENCOUNTER — Telehealth: Payer: Self-pay

## 2019-02-28 ENCOUNTER — Telehealth (INDEPENDENT_AMBULATORY_CARE_PROVIDER_SITE_OTHER): Payer: Medicare Other | Admitting: Internal Medicine

## 2019-02-28 ENCOUNTER — Other Ambulatory Visit (HOSPITAL_COMMUNITY): Payer: Self-pay | Admitting: Cardiology

## 2019-02-28 ENCOUNTER — Encounter: Payer: Self-pay | Admitting: Internal Medicine

## 2019-02-28 ENCOUNTER — Other Ambulatory Visit: Payer: Self-pay

## 2019-02-28 ENCOUNTER — Ambulatory Visit: Payer: Self-pay

## 2019-02-28 VITALS — Ht 60.0 in | Wt 108.0 lb

## 2019-02-28 DIAGNOSIS — I480 Paroxysmal atrial fibrillation: Secondary | ICD-10-CM | POA: Diagnosis not present

## 2019-02-28 DIAGNOSIS — D6869 Other thrombophilia: Secondary | ICD-10-CM | POA: Diagnosis not present

## 2019-02-28 DIAGNOSIS — R001 Bradycardia, unspecified: Secondary | ICD-10-CM

## 2019-02-28 DIAGNOSIS — I10 Essential (primary) hypertension: Secondary | ICD-10-CM

## 2019-02-28 NOTE — Progress Notes (Signed)
Electrophysiology TeleHealth Note  Due to national recommendations of social distancing due to [place] 19, an audio telehealth visit is felt to be most appropriate for this patient at this time.  Verbal consent was obtained by me for the telehealth visit today.  The patient does not have capability for a virtual visit.  A phone visit is therefore required today.   Date:  02/28/2019   ID:  Sarah Mays, DOB Jun 27, 1930, MRN [id number]  Location: patient's home  Provider location:  [facility]  Evaluation Performed: Follow-up visit  PCP:  Gayland Curry, DO   Electrophysiologist:  Dr Rayann Heman  Chief Complaint:  palpitations  History of Present Illness:    Sarah Mays is a 86 y.o. female who presents via telehealth conferencing today.  Since last being seen in our clinic, the patient reports doing very well.  She has occasional arrhythmias but is minimally symptomatic.  She is very active.  She has rare symptoms of afib.  + very rare dizziness and fatigue. Today, she denies symptoms of palpitations, chest pain,  lower extremity edema, presyncope, or syncope.  The patient is otherwise without complaint today.   Past Medical History:  Diagnosis Date  . Abnormal stress test    a. 11/2011 Ex MV: EF 80%, small, partially reversible anteroapical defect->mild ischemia vs attenuation-->med Rx.  [name] Kitchen Alopecia, unspecified   . Arthritis   . Chronic diastolic CHF (congestive heart failure) ([place])    a. 11/2013 Echo: EF 60-65%, no rwma, mild TR, PASP 72mmHg.  . Closed fracture of lower end of radius with ulna   . Deafness    Left, s/p multiple surgeries  . Essential hypertension   . Gouty arthropathy, unspecified   . History of kidney stones   . Hypothyroidism   . Insomnia, unspecified   . Macular degeneration    Left, s/p inj. tx  . Mitral valve disorders(424.0)   . Neck mass    right, w/u including MRI negative  . PAF (paroxysmal atrial fibrillation) (HCC)    a. on amio  (02/2012 mild obstruction on PFT's) and xarelto.  . Polycythemia vera(238.4)   . Pure hypercholesterolemia   . Senile osteoporosis    Reclast in the past  . Tricuspid valve disorders, specified as nonrheumatic     Past Surgical History:  Procedure Laterality Date  . BREAST BIOPSY  06/18/1998   left  . CARDIOVERSION N/A 07/15/2017   Procedure: CARDIOVERSION;  Surgeon: Larey Dresser, MD;  Location: [facility] ENDOSCOPY;  Service: Cardiovascular;  Laterality: N/A;  . CARDIOVERSION N/A 07/21/2018   Procedure: CARDIOVERSION;  Surgeon: Larey Dresser, MD;  Location: [facility] ENDOSCOPY;  Service: Cardiovascular;  Laterality: N/A;  . CARDIOVERSION N/A 10/06/2018   Procedure: CARDIOVERSION;  Surgeon: Larey Dresser, MD;  Location: [facility] ENDOSCOPY;  Service: Cardiovascular;  Laterality: N/A;  . ELECTROPHYSIOLOGIC STUDY N/A 01/08/2016   Procedure: Atrial Fibrillation Ablation;  Surgeon: Thompson Grayer, MD;  Location: [facility] CV LAB;  Service: Cardiovascular;  Laterality: N/A;  . FEMUR IM NAIL Right 10/08/2015   Procedure: INTRAMEDULLARY (IM) NAIL FEMORAL RIGHT;  Surgeon: Paralee Cancel, MD;  Location: [facility] ORS;  Service: Orthopedics;  Laterality: Right;  . MASS EXCISION Right 11/26/2016   Procedure: EXCISION RIGHT LATERAL NECK MASS;  Surgeon: Jerrell Belfast, MD;  Location: [place];  Service: ENT;  Laterality: Right;  . TEE WITHOUT CARDIOVERSION N/A 07/15/2017   Procedure: TRANSESOPHAGEAL ECHOCARDIOGRAM (TEE);  Surgeon: Larey Dresser, MD;  Location: [facility] ENDOSCOPY;  Service:  Cardiovascular;  Laterality: N/A;  . TONSILLECTOMY    . TOTAL ABDOMINAL HYSTERECTOMY    . TYMPANOPLASTY Bilateral     Current Outpatient Medications  Medication Sig Dispense Refill  . amiodarone (PACERONE) 200 MG tablet Take 1 tablet (200 mg total) by mouth daily. 90 tablet 3  . apixaban (ELIQUIS) 2.5 MG TABS tablet Take 1 tablet (2.5 mg total) by mouth 2 (two) times daily. 180 tablet 3  . Cholecalciferol (VITAMIN D) 2000 units CAPS Take  2,000 Units by mouth daily.     . fentaNYL (DURAGESIC) 12 MCG/HR Place 1 patch onto the skin every 3 (three) days. 10 patch 0  . furosemide (LASIX) 80 MG tablet TAKE 1 TABLET BY MOUTH TWICE DAILY. 60 tablet 0  . gabapentin (NEURONTIN) 100 MG capsule TAKE THREE (3) CAPSULES AT BEDTIME. 90 capsule 0  . hydrALAZINE (APRESOLINE) 50 MG tablet TAKE 1 1/2 TABLETS THREE TIMES DAILY. 135 tablet 0  . hydroxyurea (HYDREA) 500 MG capsule TAKE 1 CAPSULE ON MONDAY, WEDNESDAY,AND FRIDAY, AND 2 CAPSULES THE OTHER DAYS OF THE WEEK. 100 capsule 0  . levothyroxine (SYNTHROID) 100 MCG tablet TAKE 1 TABLET ONCE DAILY BEFORE BREAKFAST. 90 tablet 1  . Magnesium 250 MG TABS Take 250 mg by mouth daily.     . Melatonin 10 MG TABS Take 10 mg by mouth at bedtime.    . Multiple Vitamins-Minerals (PRESERVISION AREDS 2) CAPS Take 1 capsule by mouth 2 (two) times daily.     . potassium chloride (KLOR-CON) 10 MEQ tablet TAKE 2 TABLETS IN THE MORNING AND 1 TABLET IN THE EVENING. 90 tablet 6  . sennosides-docusate sodium (SENOKOT-S) 8.6-50 MG tablet Take 2 tablets by mouth 2 (two) times daily.    . sodium chloride (OCEAN) 0.65 % SOLN nasal spray Place 2 sprays into both nostrils 2 (two) times daily.     . traMADol (ULTRAM) 50 MG tablet TAKE ONE TABLET AT BEDTIME. 30 tablet 0   No current facility-administered medications for this visit.    Allergies:   Amlodipine and Augmentin [amoxicillin-pot clavulanate]   Social History:  The patient  reports that she quit smoking about 35 years ago. Her smoking use included cigarettes. She has never used smokeless tobacco. She reports previous alcohol use of about 1.0 standard drinks of alcohol per week. She reports that she does not use drugs.   Family History:  The patient's  family history includes Breast cancer in her daughter; Cancer in her daughter, sister, sister, and sister; Heart disease in her father and sister; Hypertension in her father; Ovarian cancer (age of onset: 45) in her  mother.   ROS:  Please see the history of present illness.   All other systems are personally reviewed and negative.    Exam:    Vital Signs:  Ht 5' (1.524 m)   Wt 108 lb (49 kg)   BMI 21.09 kg/m   Well sounding, alert and conversant   Labs/Other Tests and Data Reviewed:    Recent Labs: 10/13/2018: ALT 17; Magnesium 2.4; TSH 3.622 12/22/2018: Hemoglobin 12.3; Platelets 423 01/22/2019: B Natriuretic Peptide 534.5; BUN 25; Creatinine, Ser 1.34; Potassium 4.0; Sodium 139   Wt Readings from Last 3 Encounters:  02/28/19 108 lb (49 kg)  02/14/19 110 lb (49.9 kg)  01/23/19 110 lb (49.9 kg)      ASSESSMENT & PLAN:    1.  Paroxysmal atrial fibrillation/ atypical atrial flutter Reasonably well controlled Minimally symptomatic Would prefer rate control going forward We agree to  avoid ablation given risks, paucity of symptoms, and her advanced age. chads2vasc score is 5.  She is on eliquis  2. Bradycardia Asymptomatic No indication for pacing currently  3. HTN Stable No change required today  4. Mitral regurgitation Moderate to severe by prior TEE   Follow-up:  Return to see EP NP In 6 months   Patient Risk:  after full review of this patients clinical status, I feel that they are at moderate risk at this time.  Today, I have spent 15 minutes with the patient with telehealth technology discussing arrhythmia management .    SignedThompson Grayer, MD  02/28/2019 3:36 PM     [address] [place]  [phone] (office) [phone] (fax)

## 2019-03-01 DIAGNOSIS — H353211 Exudative age-related macular degeneration, right eye, with active choroidal neovascularization: Secondary | ICD-10-CM | POA: Diagnosis not present

## 2019-03-01 DIAGNOSIS — H353124 Nonexudative age-related macular degeneration, left eye, advanced atrophic with subfoveal involvement: Secondary | ICD-10-CM | POA: Diagnosis not present

## 2019-03-01 DIAGNOSIS — H353222 Exudative age-related macular degeneration, left eye, with inactive choroidal neovascularization: Secondary | ICD-10-CM | POA: Diagnosis not present

## 2019-03-01 DIAGNOSIS — H02052 Trichiasis without entropian right lower eyelid: Secondary | ICD-10-CM | POA: Diagnosis not present

## 2019-03-03 ENCOUNTER — Other Ambulatory Visit (HOSPITAL_COMMUNITY): Payer: Self-pay | Admitting: Cardiology

## 2019-03-05 ENCOUNTER — Other Ambulatory Visit: Payer: Self-pay | Admitting: Internal Medicine

## 2019-03-05 DIAGNOSIS — M8000XD Age-related osteoporosis with current pathological fracture, unspecified site, subsequent encounter for fracture with routine healing: Secondary | ICD-10-CM

## 2019-03-05 MED ORDER — DENOSUMAB 60 MG/ML ~~LOC~~ SOSY: 60.0000 mg | PREFILLED_SYRINGE | Freq: Once | SUBCUTANEOUS | 1 refills | Status: AC

## 2019-03-05 NOTE — Progress Notes (Signed)
Sent prolia to [employer] to check on coverage through pharmacy benefit.  If this does not work (unaffordable), she'll need to come to [facility] for the injections.

## 2019-03-12 DIAGNOSIS — H0102B Squamous blepharitis left eye, upper and lower eyelids: Secondary | ICD-10-CM | POA: Diagnosis not present

## 2019-03-12 DIAGNOSIS — H00024 Hordeolum internum left upper eyelid: Secondary | ICD-10-CM | POA: Diagnosis not present

## 2019-03-12 DIAGNOSIS — H0102A Squamous blepharitis right eye, upper and lower eyelids: Secondary | ICD-10-CM | POA: Diagnosis not present

## 2019-03-14 ENCOUNTER — Telehealth: Payer: Self-pay | Admitting: *Deleted

## 2019-03-14 NOTE — Telephone Encounter (Signed)
Bin: [id number] PCN: [place] [facility]: NCPARTD ID: [id number]

## 2019-03-14 NOTE — Telephone Encounter (Signed)
Received request from [place] requesting prior authorization for Prolia. Initiated through [facility]. Went into determination. Awaiting determination 24-72 hours.  Key: [id number]

## 2019-03-15 NOTE — Telephone Encounter (Signed)
Received call from [facility], Elmyra Ricks [phone] option 5,  and Prolia was APPROVED 03/14/19-03/13/20.

## 2019-03-20 ENCOUNTER — Telehealth: Payer: Self-pay | Admitting: *Deleted

## 2019-03-20 DIAGNOSIS — Z23 Encounter for immunization: Secondary | ICD-10-CM | POA: Diagnosis not present

## 2019-03-20 NOTE — Telephone Encounter (Signed)
After I ordered it, I never heard another thing from the pharmacy about it which is why there is no documentation.

## 2019-03-20 NOTE — Telephone Encounter (Signed)
-----   Message from Hart Robinsons sent at 03/20/2019  2:25 PM EST ----- Regarding: Prolia Injection/Covid Vaccine Hi Sarah Mays states that she received covid vaccine yesterday.  She wants to know if there is a wait time for getting her next Prolia after getting the covid vaccine.  She stated that she did not receive Prolia in January 2021.  I will message Lattie Haw as to when patient is next scheduled for Prolia. Ms. Rardon can be reached at [phone].  Thank you  [employer]

## 2019-03-20 NOTE — Telephone Encounter (Signed)
Sarah Mays.  Is pharmacy aware it was approved?  Was patient informed?  She would need someone to pick up the prolia for her (unless they can deliver it) so that clinic nurse can administer it.   Of course, it requires refrigeration.

## 2019-03-20 NOTE — Telephone Encounter (Signed)
Delaney Meigs, [place]; Tanna Savoy, CMA  When I spoke to Ms Joeann in late December 2020, she chose to continue getting Prolia at [facility]. Email was sent to Dr. Mariea Clonts & she ordered prolia 02/15/19, at that point, I don't have documentation to see what happen or why she didn't get her injection. She was due 02/28/19.   Her other concern is that, is there a "wait time" after the COVID vaccine, bc she got that injection yday?  Thanks, Lattie Haw       Previous Messages   ----- Message -----  From: Hart Robinsons  Sent: 03/20/2019  2:22 PM EST  To: Quay Burow  Subject: Prolia Injection                 HI Stephannie Peters Deininger has some questions regarding her Prolia injection. She states that she did not receive Prolia injection January 2021. Please give patient a call at [phone].   Thank you    [employer]

## 2019-03-20 NOTE — Telephone Encounter (Signed)
Dr. Mariea Clonts please see message from suzette

## 2019-03-20 NOTE — Telephone Encounter (Signed)
Dr. Mariea Clonts please see previous phone note on 03/14/2019, this was just approved.

## 2019-03-21 NOTE — Telephone Encounter (Signed)
Yes, Suzette spoke with Ms Demaris this morning regarding her prolia cost with pharmacy & confirming that she had transportation to [facility] to get inj here.   I ran & verified ins benefits to be [place] [employer] & bcbs 2nd with pt & [employer]. 0 copay.  Karena Addison has scheduled pt for 03/22/19 Thanks [facility] team:) Lattie Haw

## 2019-03-21 NOTE — Telephone Encounter (Signed)
Spoke with patient and she states [employer] stated her Prolia would be over $500.00. I then called [facility] and it will be over $500.00 and per pt she will not pay $500.00 for prolia.  Spoke to patient again and she is willing to come to [facility] main office to have prolia administered.

## 2019-03-21 NOTE — Telephone Encounter (Signed)
Well, that means we need to confirm that it is covered in the office setting and the cost.  Note patient has said she wants to come to [facility] for it b/c it is too expensive to get through the pharmacy.  This should not be changed back to going through the pharmacy.  If it is not covered in the clinic, we will have to stop it b/c it's too expensive.

## 2019-03-21 NOTE — Telephone Encounter (Signed)
Dr. Mariea Clonts, pt is coming to [facility] tomorrow for Prolia

## 2019-03-21 NOTE — Telephone Encounter (Signed)
Great.  Thank you all.

## 2019-03-22 ENCOUNTER — Ambulatory Visit (INDEPENDENT_AMBULATORY_CARE_PROVIDER_SITE_OTHER): Payer: Medicare Other

## 2019-03-22 ENCOUNTER — Other Ambulatory Visit: Payer: Self-pay

## 2019-03-22 DIAGNOSIS — M810 Age-related osteoporosis without current pathological fracture: Secondary | ICD-10-CM | POA: Diagnosis not present

## 2019-03-22 MED ORDER — DENOSUMAB 60 MG/ML ~~LOC~~ SOSY
60.0000 mg | PREFILLED_SYRINGE | Freq: Once | SUBCUTANEOUS | Status: AC
  Administered 2019-03-22: 60 mg via SUBCUTANEOUS

## 2019-03-23 ENCOUNTER — Other Ambulatory Visit: Payer: Self-pay | Admitting: *Deleted

## 2019-03-23 DIAGNOSIS — M545 Low back pain, unspecified: Secondary | ICD-10-CM

## 2019-03-23 MED ORDER — FENTANYL 12 MCG/HR TD PT72: 1.0000 | MEDICATED_PATCH | TRANSDERMAL | 0 refills | Status: DC

## 2019-03-23 NOTE — Telephone Encounter (Signed)
Patient requested refill [place] Verified LR: 02/23/2019 Pended Rx and sent to [place] for approval due to Dr. Mariea Clonts out of office.

## 2019-03-25 ENCOUNTER — Other Ambulatory Visit: Payer: Self-pay | Admitting: Hematology and Oncology

## 2019-03-25 ENCOUNTER — Other Ambulatory Visit: Payer: Self-pay | Admitting: Internal Medicine

## 2019-03-25 DIAGNOSIS — M545 Low back pain, unspecified: Secondary | ICD-10-CM

## 2019-03-26 NOTE — Telephone Encounter (Signed)
Prescription sent to Dr. Mariea Clonts for Approval

## 2019-03-27 DIAGNOSIS — H00024 Hordeolum internum left upper eyelid: Secondary | ICD-10-CM | POA: Diagnosis not present

## 2019-04-01 ENCOUNTER — Other Ambulatory Visit (HOSPITAL_COMMUNITY): Payer: Self-pay | Admitting: Cardiology

## 2019-04-02 DIAGNOSIS — H0102B Squamous blepharitis left eye, upper and lower eyelids: Secondary | ICD-10-CM | POA: Diagnosis not present

## 2019-04-02 DIAGNOSIS — H04123 Dry eye syndrome of bilateral lacrimal glands: Secondary | ICD-10-CM | POA: Diagnosis not present

## 2019-04-02 DIAGNOSIS — H16143 Punctate keratitis, bilateral: Secondary | ICD-10-CM | POA: Diagnosis not present

## 2019-04-02 DIAGNOSIS — H00024 Hordeolum internum left upper eyelid: Secondary | ICD-10-CM | POA: Diagnosis not present

## 2019-04-02 DIAGNOSIS — H0102A Squamous blepharitis right eye, upper and lower eyelids: Secondary | ICD-10-CM | POA: Diagnosis not present

## 2019-04-05 DIAGNOSIS — H353222 Exudative age-related macular degeneration, left eye, with inactive choroidal neovascularization: Secondary | ICD-10-CM | POA: Diagnosis not present

## 2019-04-05 DIAGNOSIS — H11012 Amyloid pterygium of left eye: Secondary | ICD-10-CM | POA: Diagnosis not present

## 2019-04-05 DIAGNOSIS — H353211 Exudative age-related macular degeneration, right eye, with active choroidal neovascularization: Secondary | ICD-10-CM | POA: Diagnosis not present

## 2019-04-05 DIAGNOSIS — H353124 Nonexudative age-related macular degeneration, left eye, advanced atrophic with subfoveal involvement: Secondary | ICD-10-CM | POA: Diagnosis not present

## 2019-04-06 ENCOUNTER — Telehealth: Payer: Self-pay | Admitting: *Deleted

## 2019-04-06 NOTE — Telephone Encounter (Signed)
Patient called and left message on clinical intake requesting a refill on her Fentanyl Patch.  NCCSRS Database Verified and LR was 03/23/2019. Patient is not due for refill until March 12.  Tried calling patient back and LMOM to RC.

## 2019-04-08 ENCOUNTER — Emergency Department (HOSPITAL_COMMUNITY)
Admission: EM | Admit: 2019-04-08 | Discharge: 2019-04-09 | Disposition: A | Discharge status: Home / Self Care | Payer: Medicare Other | Attending: Emergency Medicine | Admitting: Emergency Medicine

## 2019-04-08 ENCOUNTER — Other Ambulatory Visit: Payer: Self-pay

## 2019-04-08 DIAGNOSIS — K5900 Constipation, unspecified: Secondary | ICD-10-CM | POA: Diagnosis not present

## 2019-04-08 DIAGNOSIS — E039 Hypothyroidism, unspecified: Secondary | ICD-10-CM | POA: Diagnosis not present

## 2019-04-08 DIAGNOSIS — Z79899 Other long term (current) drug therapy: Secondary | ICD-10-CM | POA: Insufficient documentation

## 2019-04-08 DIAGNOSIS — K5903 Drug induced constipation: Secondary | ICD-10-CM

## 2019-04-08 DIAGNOSIS — R110 Nausea: Secondary | ICD-10-CM | POA: Diagnosis not present

## 2019-04-08 DIAGNOSIS — N1330 Unspecified hydronephrosis: Secondary | ICD-10-CM | POA: Diagnosis not present

## 2019-04-08 DIAGNOSIS — Z87891 Personal history of nicotine dependence: Secondary | ICD-10-CM | POA: Insufficient documentation

## 2019-04-08 DIAGNOSIS — I5032 Chronic diastolic (congestive) heart failure: Secondary | ICD-10-CM | POA: Insufficient documentation

## 2019-04-08 DIAGNOSIS — I10 Essential (primary) hypertension: Secondary | ICD-10-CM | POA: Diagnosis not present

## 2019-04-08 DIAGNOSIS — I110 Hypertensive heart disease with heart failure: Secondary | ICD-10-CM | POA: Insufficient documentation

## 2019-04-08 DIAGNOSIS — Z7901 Long term (current) use of anticoagulants: Secondary | ICD-10-CM | POA: Diagnosis not present

## 2019-04-08 DIAGNOSIS — I2510 Atherosclerotic heart disease of native coronary artery without angina pectoris: Secondary | ICD-10-CM | POA: Diagnosis not present

## 2019-04-08 DIAGNOSIS — R52 Pain, unspecified: Secondary | ICD-10-CM | POA: Diagnosis not present

## 2019-04-08 DIAGNOSIS — R1031 Right lower quadrant pain: Secondary | ICD-10-CM | POA: Diagnosis present

## 2019-04-08 DIAGNOSIS — R1084 Generalized abdominal pain: Secondary | ICD-10-CM | POA: Diagnosis not present

## 2019-04-08 DIAGNOSIS — R140 Abdominal distension (gaseous): Secondary | ICD-10-CM | POA: Diagnosis not present

## 2019-04-09 ENCOUNTER — Emergency Department (HOSPITAL_COMMUNITY): Payer: Medicare Other

## 2019-04-09 ENCOUNTER — Encounter (HOSPITAL_COMMUNITY): Payer: Self-pay | Admitting: Emergency Medicine

## 2019-04-09 DIAGNOSIS — K5900 Constipation, unspecified: Secondary | ICD-10-CM | POA: Diagnosis not present

## 2019-04-09 DIAGNOSIS — N1330 Unspecified hydronephrosis: Secondary | ICD-10-CM | POA: Diagnosis not present

## 2019-04-09 LAB — CBC
HCT: 44 % (ref 36.0–46.0)
Hemoglobin: 15 g/dL (ref 12.0–15.0)
MCH: 38.7 pg — ABNORMAL HIGH (ref 26.0–34.0)
MCHC: 34.1 g/dL (ref 30.0–36.0)
MCV: 113.4 fL — ABNORMAL HIGH (ref 80.0–100.0)
Platelets: 534 10*3/uL — ABNORMAL HIGH (ref 150–400)
RBC: 3.88 MIL/uL (ref 3.87–5.11)
RDW: 16.7 % — ABNORMAL HIGH (ref 11.5–15.5)
WBC: 10.6 10*3/uL — ABNORMAL HIGH (ref 4.0–10.5)
nRBC: 0 % (ref 0.0–0.2)

## 2019-04-09 LAB — COMPREHENSIVE METABOLIC PANEL
ALT: 16 U/L (ref 0–44)
AST: 20 U/L (ref 15–41)
Albumin: 4.9 g/dL (ref 3.5–5.0)
Alkaline Phosphatase: 86 U/L (ref 38–126)
Anion gap: 13 (ref 5–15)
BUN: 32 mg/dL — ABNORMAL HIGH (ref 8–23)
CO2: 24 mmol/L (ref 22–32)
Calcium: 9.5 mg/dL (ref 8.9–10.3)
Chloride: 97 mmol/L — ABNORMAL LOW (ref 98–111)
Creatinine, Ser: 1.27 mg/dL — ABNORMAL HIGH (ref 0.44–1.00)
GFR calc Af Amer: 44 mL/min — ABNORMAL LOW (ref >60)
GFR calc non Af Amer: 38 mL/min — ABNORMAL LOW (ref >60)
Glucose, Bld: 88 mg/dL (ref 70–99)
Potassium: 3.7 mmol/L (ref 3.5–5.1)
Sodium: 134 mmol/L — ABNORMAL LOW (ref 135–145)
Total Bilirubin: 1.1 mg/dL (ref 0.3–1.2)
Total Protein: 7.2 g/dL (ref 6.5–8.1)

## 2019-04-09 LAB — I-STAT CHEM 8, ED
BUN: 30 mg/dL — ABNORMAL HIGH (ref 8–23)
Calcium, Ion: 1.12 mmol/L — ABNORMAL LOW (ref 1.15–1.40)
Chloride: 97 mmol/L — ABNORMAL LOW (ref 98–111)
Creatinine, Ser: 1.5 mg/dL — ABNORMAL HIGH (ref 0.44–1.00)
Glucose, Bld: 97 mg/dL (ref 70–99)
HCT: 46 % (ref 36.0–46.0)
Hemoglobin: 15.6 g/dL — ABNORMAL HIGH (ref 12.0–15.0)
Potassium: 3.6 mmol/L (ref 3.5–5.1)
Sodium: 134 mmol/L — ABNORMAL LOW (ref 135–145)
TCO2: 27 mmol/L (ref 22–32)

## 2019-04-09 LAB — URINALYSIS, ROUTINE W REFLEX MICROSCOPIC
Bilirubin Urine: NEGATIVE
Glucose, UA: NEGATIVE mg/dL
Hgb urine dipstick: NEGATIVE
Ketones, ur: NEGATIVE mg/dL
Leukocytes,Ua: NEGATIVE
Nitrite: NEGATIVE
Protein, ur: NEGATIVE mg/dL
Specific Gravity, Urine: 1.009 (ref 1.005–1.030)
pH: 6 (ref 5.0–8.0)

## 2019-04-09 MED ORDER — POLYETHYLENE GLYCOL 3350 17 GM/SCOOP PO POWD: 17.0000 g | Freq: Two times a day (BID) | ORAL | 0 refills | Status: DC

## 2019-04-09 MED ORDER — FENTANYL CITRATE (PF) 100 MCG/2ML IJ SOLN
50.0000 ug | Freq: Once | INTRAMUSCULAR | Status: AC
  Administered 2019-04-09: 50 ug via INTRAVENOUS
  Filled 2019-04-09: qty 2

## 2019-04-09 MED ORDER — ONDANSETRON HCL 4 MG/2ML IJ SOLN
4.0000 mg | Freq: Once | INTRAMUSCULAR | Status: AC
  Administered 2019-04-09: 4 mg via INTRAVENOUS
  Filled 2019-04-09: qty 2

## 2019-04-09 MED ORDER — KETOROLAC TROMETHAMINE 30 MG/ML IJ SOLN
30.0000 mg | Freq: Once | INTRAMUSCULAR | Status: AC
  Administered 2019-04-09: 30 mg via INTRAVENOUS
  Filled 2019-04-09: qty 1

## 2019-04-09 NOTE — ED Notes (Signed)
Patient returned from CT

## 2019-04-09 NOTE — Telephone Encounter (Signed)
Patient called back and stated never mind she is not due for Refill yet.

## 2019-04-09 NOTE — ED Provider Notes (Signed)
[facility] DEPT Provider Note   CSN: [id number] Arrival date & time: 04/08/19  2350     History Chief Complaint  Patient presents with  . Abdominal Pain    constipated    Sarah Mays is a 86 y.o. female.  The history is provided by the patient.  Abdominal Pain Pain location:  RLQ Pain quality: sharp   Pain quality: not cramping   Pain radiates to:  Does not radiate Pain severity:  Severe Onset quality:  Gradual Duration:  1 day Timing:  Constant Progression:  Unchanged Chronicity:  New Context: laxative use   Context comment:  On fentanyl and tramadol for chronic pain and is constipated and used a fleets enema today and then pain strarted and only a little stool produced.  Relieved by:  Nothing Worsened by:  Nothing Ineffective treatments:  Acetaminophen Associated symptoms: constipation   Associated symptoms: no anorexia, no diarrhea, no dysuria, no fever, no flatus, no hematochezia, no shortness of breath and no vomiting   Risk factors: being elderly   Risk factors: no alcohol abuse        Past Medical History:  Diagnosis Date  . Abnormal stress test    a. 11/2011 Ex MV: EF 80%, small, partially reversible anteroapical defect->mild ischemia vs attenuation-->med Rx.  [name] Kitchen Alopecia, unspecified   . Arthritis   . Chronic diastolic CHF (congestive heart failure) ([place])    a. 11/2013 Echo: EF 60-65%, no rwma, mild TR, PASP 57mmHg.  . Closed fracture of lower end of radius with ulna   . Deafness    Left, s/p multiple surgeries  . Essential hypertension   . Gouty arthropathy, unspecified   . History of kidney stones   . Hypothyroidism   . Insomnia, unspecified   . Macular degeneration    Left, s/p inj. tx  . Mitral valve disorders(424.0)   . Neck mass    right, w/u including MRI negative  . PAF (paroxysmal atrial fibrillation) (HCC)    a. on amio (02/2012 mild obstruction on PFT's) and xarelto.  . Polycythemia vera(238.4)     . Pure hypercholesterolemia   . Senile osteoporosis    Reclast in the past  . Tricuspid valve disorders, specified as nonrheumatic     Patient Active Problem List   Diagnosis Date Noted  . Baker's cyst of knee, unspecified laterality 12/06/2018  . Anemia in neoplastic disease 04/14/2018  . Neurogenic claudication due to lumbar spinal stenosis 01/28/2018  . Hydronephrosis with ureteropelvic junction (UPJ) obstruction 08/22/2017  . DDD (degenerative disc disease), cervical 08/09/2017  . Hypercoagulable state due to atrial fibrillation ([place]) 08/09/2017  . Presbycusis of both ears 08/03/2017  . Deficiency anemia 07/26/2017  . Pure hypercholesterolemia   . PONV (postoperative nausea and vomiting)   . PAF (paroxysmal atrial fibrillation) ([place])   . Neck mass   . Insomnia, unspecified   . History of kidney stones   . Deafness   . Closed fracture of lower end of radius with ulna   . Arthritis   . Abnormal stress test   . Rotator cuff arthropathy, right 09/08/2016  . Trigger point of right shoulder region 05/05/2016  . Spondylosis of cervical region without myelopathy or radiculopathy 05/05/2016  . Chondrocalcinosis 05/05/2016  . Chronic pain disorder 05/05/2016  . A-fib ([place]) 01/08/2016  . Closed fracture of shaft of right femur, initial encounter ([place]) 10/25/2015  . Status post-operative repair of hip fracture 10/25/2015  . Greater trochanteric bursitis of right hip 09/29/2015  .  Asymptomatic cholelithiasis 05/09/2015  . Hair loss 11/07/2014  . Bilateral leg edema 11/07/2014  . Constipation due to pain medication 11/06/2014  . Hypotension due to drugs 11/06/2014  . Chest pain 09/01/2014  . Osteoporosis 01/04/2014  . Gout 01/04/2014  . Headache 01/04/2014  . Hypothyroidism 11/19/2013  . Hypercalcemia 10/11/2013  . Insomnia 05/16/2013  . Lumbar back pain 05/16/2013  . Polycythemia vera ([place])   . Mass of right side of neck   . Senile osteoporosis   . Macular degeneration    . Sick sinus syndrome ([place]) 03/09/2012  . CAD (coronary artery disease) 12/27/2011  . Paroxysmal atrial fibrillation ([place]) 12/27/2011  . Chronic diastolic CHF (congestive heart failure) ([place]) 12/27/2011  . HTN (hypertension) 12/27/2011    Past Surgical History:  Procedure Laterality Date  . BREAST BIOPSY  06/18/1998   left  . CARDIOVERSION N/A 07/15/2017   Procedure: CARDIOVERSION;  Surgeon: Larey Dresser, MD;  Location: [facility] ENDOSCOPY;  Service: Cardiovascular;  Laterality: N/A;  . CARDIOVERSION N/A 07/21/2018   Procedure: CARDIOVERSION;  Surgeon: Larey Dresser, MD;  Location: [facility] ENDOSCOPY;  Service: Cardiovascular;  Laterality: N/A;  . CARDIOVERSION N/A 10/06/2018   Procedure: CARDIOVERSION;  Surgeon: Larey Dresser, MD;  Location: [facility] ENDOSCOPY;  Service: Cardiovascular;  Laterality: N/A;  . ELECTROPHYSIOLOGIC STUDY N/A 01/08/2016   Procedure: Atrial Fibrillation Ablation;  Surgeon: Thompson Grayer, MD;  Location: [facility] CV LAB;  Service: Cardiovascular;  Laterality: N/A;  . FEMUR IM NAIL Right 10/08/2015   Procedure: INTRAMEDULLARY (IM) NAIL FEMORAL RIGHT;  Surgeon: Paralee Cancel, MD;  Location: [facility] ORS;  Service: Orthopedics;  Laterality: Right;  . MASS EXCISION Right 11/26/2016   Procedure: EXCISION RIGHT LATERAL NECK MASS;  Surgeon: Jerrell Belfast, MD;  Location: [facility];  Service: ENT;  Laterality: Right;  . TEE WITHOUT CARDIOVERSION N/A 07/15/2017   Procedure: TRANSESOPHAGEAL ECHOCARDIOGRAM (TEE);  Surgeon: Larey Dresser, MD;  Location: [facility] ENDOSCOPY;  Service: Cardiovascular;  Laterality: N/A;  . TONSILLECTOMY    . TOTAL ABDOMINAL HYSTERECTOMY    . TYMPANOPLASTY Bilateral      OB History   No obstetric history on file.     Family History  Problem Relation Age of Onset  . Hypertension Father   . Heart disease Father        patient does not know details.   . Ovarian cancer Mother 74  . Cancer Sister        colon  . Heart disease Sister   . Cancer Sister   .  Cancer Sister        hodgkin disease  . Breast cancer Daughter   . Cancer Daughter        breast    Social History   Tobacco Use  . Smoking status: Former Smoker    Types: Cigarettes    Quit date: 01/19/1984    Years since quitting: 35.2  . Smokeless tobacco: Never Used  Substance Use Topics  . Alcohol use: Not Currently    Alcohol/week: 1.0 standard drinks    Types: 1 Glasses of wine per week    Comment: 2 per day/ 14 a week  . Drug use: No    Home Medications Prior to Admission medications   Medication Sig Start Date End Date Taking? Authorizing Provider  traMADol (ULTRAM) 50 MG tablet TAKE ONE TABLET AT BEDTIME. 03/26/19   Reed, Tiffany L, DO  amiodarone (PACERONE) 200 MG tablet Take 1 tablet (200 mg total) by mouth daily. 09/05/18  Sherran Needs, NP  apixaban (ELIQUIS) 2.5 MG TABS tablet Take 1 tablet (2.5 mg total) by mouth 2 (two) times daily. 10/24/18   Larey Dresser, MD  Cholecalciferol (VITAMIN D) 2000 units CAPS Take 2,000 Units by mouth daily.     [provider]  fentaNYL (DURAGESIC) 12 MCG/HR Place 1 patch onto the skin every 3 (three) days. 03/23/19   Ngetich, Dinah C, NP  furosemide (LASIX) 80 MG tablet TAKE 1 TABLET BY MOUTH TWICE DAILY. 04/02/19   Larey Dresser, MD  gabapentin (NEURONTIN) 100 MG capsule TAKE THREE (3) CAPSULES AT BEDTIME. 02/19/19   Reed, Tiffany L, DO  hydrALAZINE (APRESOLINE) 50 MG tablet TAKE 1 1/2 TABLETS THREE TIMES DAILY. 04/02/19   Larey Dresser, MD  hydroxyurea (HYDREA) 500 MG capsule TAKE 1 CAPSULE ON MONDAY, WEDNESDAY,AND FRIDAY, AND 2 CAPSULES THE OTHER DAYS OF THE WEEK. 03/26/19   Heath Lark, MD  levothyroxine (SYNTHROID) 100 MCG tablet TAKE 1 TABLET ONCE DAILY BEFORE BREAKFAST. 01/15/19   Reed, Tiffany L, DO  Magnesium 250 MG TABS Take 250 mg by mouth daily.     [provider]  Melatonin 10 MG TABS Take 10 mg by mouth at bedtime.    [provider]  Multiple Vitamins-Minerals (PRESERVISION AREDS 2)  CAPS Take 1 capsule by mouth 2 (two) times daily.     [provider]  polyethylene glycol powder (MIRALAX) 17 GM/SCOOP powder Take 17 g by mouth in the morning and at bedtime. For 7 days 04/09/19   Roschelle Calandra, MD  potassium chloride (KLOR-CON) 10 MEQ tablet TAKE 2 TABLETS IN THE MORNING AND 1 TABLET IN THE EVENING. 01/18/19   Larey Dresser, MD  sennosides-docusate sodium (SENOKOT-S) 8.6-50 MG tablet Take 2 tablets by mouth 2 (two) times daily.    [provider]  sodium chloride (OCEAN) 0.65 % SOLN nasal spray Place 2 sprays into both nostrils 2 (two) times daily.     [provider]    Allergies    Amlodipine and Augmentin [amoxicillin-pot clavulanate]  Review of Systems   Review of Systems  Constitutional: Negative for fever.  HENT: Negative for congestion.   Eyes: Negative for visual disturbance.  Respiratory: Negative for shortness of breath.   Gastrointestinal: Positive for abdominal pain and constipation. Negative for anorexia, diarrhea, flatus, hematochezia and vomiting.  Genitourinary: Negative for dysuria.  Musculoskeletal: Negative for arthralgias.  Skin: Negative for rash.  Neurological: Negative for dizziness.  Psychiatric/Behavioral: Negative for agitation.  All other systems reviewed and are negative.   Physical Exam Updated Vital Signs BP 128/86   Pulse (!) 104   Temp 98.5 F (36.9 C) (Oral)   Resp 18   Ht 5' (1.524 m)   Wt 48.5 kg   SpO2 96%   BMI 20.90 kg/m   Physical Exam Vitals and nursing note reviewed.  Constitutional:      General: She is not in acute distress.    Appearance: She is normal weight.  HENT:     Head: Normocephalic and atraumatic.     Nose: Nose normal.  Eyes:     Conjunctiva/sclera: Conjunctivae normal.     Pupils: Pupils are equal, round, and reactive to light.  Cardiovascular:     Rate and Rhythm: Normal rate and regular rhythm.     Pulses: Normal pulses.     Heart sounds: Normal heart sounds.   Pulmonary:     Effort: Pulmonary effort is normal.     Breath sounds:  Normal breath sounds.  Abdominal:     Palpations: Abdomen is soft.     Tenderness: There is no abdominal tenderness. There is no guarding.     Comments: Gassy  Musculoskeletal:        General: Normal range of motion.     Cervical back: Normal range of motion and neck supple.  Skin:    General: Skin is warm and dry.     Capillary Refill: Capillary refill takes less than 2 seconds.  Neurological:     General: No focal deficit present.     Mental Status: She is alert and oriented to person, place, and time.     Deep Tendon Reflexes: Reflexes normal.  Psychiatric:        Mood and Affect: Mood normal.        Behavior: Behavior normal.     ED Results / Procedures / Treatments   Labs (all labs ordered are listed, but only abnormal results are displayed) Results for orders placed or performed during the hospital encounter of 04/08/19  CBC  Result Value Ref Range   WBC 10.6 (H) 4.0 - 10.5 K/uL   RBC 3.88 3.87 - 5.11 MIL/uL   Hemoglobin 15.0 12.0 - 15.0 g/dL   HCT 44.0 36.0 - 46.0 %   MCV 113.4 (H) 80.0 - 100.0 fL   MCH 38.7 (H) 26.0 - 34.0 pg   MCHC 34.1 30.0 - 36.0 g/dL   RDW 16.7 (H) 11.5 - 15.5 %   Platelets 534 (H) 150 - 400 K/uL   nRBC 0.0 0.0 - 0.2 %  Urinalysis, Routine w reflex microscopic  Result Value Ref Range   Color, Urine YELLOW YELLOW   APPearance CLEAR CLEAR   Specific Gravity, Urine 1.009 1.005 - 1.030   pH 6.0 5.0 - 8.0   Glucose, UA NEGATIVE NEGATIVE mg/dL   Hgb urine dipstick NEGATIVE NEGATIVE   Bilirubin Urine NEGATIVE NEGATIVE   Ketones, ur NEGATIVE NEGATIVE mg/dL   Protein, ur NEGATIVE NEGATIVE mg/dL   Nitrite NEGATIVE NEGATIVE   Leukocytes,Ua NEGATIVE NEGATIVE  Comprehensive metabolic panel  Result Value Ref Range   Sodium 134 (L) 135 - 145 mmol/L   Potassium 3.7 3.5 - 5.1 mmol/L   Chloride 97 (L) 98 - 111 mmol/L   CO2 24 22 - 32 mmol/L   Glucose, Bld 88 70 - 99 mg/dL    BUN 32 (H) 8 - 23 mg/dL   Creatinine, Ser 1.27 (H) 0.44 - 1.00 mg/dL   Calcium 9.5 8.9 - 10.3 mg/dL   Total Protein 7.2 6.5 - 8.1 g/dL   Albumin 4.9 3.5 - 5.0 g/dL   AST 20 15 - 41 U/L   ALT 16 0 - 44 U/L   Alkaline Phosphatase 86 38 - 126 U/L   Total Bilirubin 1.1 0.3 - 1.2 mg/dL   GFR calc non Af Amer 38 (L) >60 mL/min   GFR calc Af Amer 44 (L) >60 mL/min   Anion gap 13 5 - 15  I-stat chem 8, ED (not at [facility] or [facility])  Result Value Ref Range   Sodium 134 (L) 135 - 145 mmol/L   Potassium 3.6 3.5 - 5.1 mmol/L   Chloride 97 (L) 98 - 111 mmol/L   BUN 30 (H) 8 - 23 mg/dL   Creatinine, Ser 1.50 (H) 0.44 - 1.00 mg/dL   Glucose, Bld 97 70 - 99 mg/dL   Calcium, Ion 1.12 (L) 1.15 - 1.40 mmol/L   TCO2 27 22 - 32  mmol/L   Hemoglobin 15.6 (H) 12.0 - 15.0 g/dL   HCT 46.0 36.0 - 46.0 %   *Note: Due to a large number of results and/or encounters for the requested time period, some results have not been displayed. A complete set of results can be found in Results Review.   CT Renal Stone Study  Result Date: 04/09/2019 CLINICAL DATA:  Bilateral lower flank pain EXAM: CT ABDOMEN AND PELVIS WITHOUT CONTRAST TECHNIQUE: Multidetector CT imaging of the abdomen and pelvis was performed following the standard protocol without IV contrast. COMPARISON:  CT pelvis August 09, 2017 FINDINGS: Lower chest: There is moderate cardiomegaly. No hiatal hernia. Streaky atelectasis seen at both lung bases. A tortuous descending aorta is noted. Hepatobiliary: Although limited due to the lack of intravenous contrast, normal in appearance without gross focal abnormality. Layering calcified gallstones/sludge is present. Pancreas:  Unremarkable.  No surrounding inflammatory changes. Spleen: Normal in size. Although limited due to the lack of intravenous contrast, normal in appearance. Adrenals/Urinary Tract: Both adrenal glands appear normal. Again seen is marked right pelvicaliectasis to the level of the UPJ the mid to distal ureter  is decompressed. No renal calculi are noted. Multiple bilateral low-density lesions are seen within both kidneys. The largest within the midpole of the right kidney measuring 2 cm. The largest within the upper pole of left kidney is 4 cm. The bladder is unremarkable. Stomach/Bowel: The stomach, small bowel, and colon are normal in appearance. No inflammatory changes or obstructive findings. There is a moderate to large amount of right colonic stool present. Mildly prominent air-filled loops of transverse colon and descending colon is seen. Scattered colonic diverticula are noted. Vascular/Lymphatic: Again noted are multiple bilateral retroperitoneal coarse calcifications. There is scattered aortic atherosclerosis. Reproductive: The patient is status post hysterectomy. No adnexal masses or collections seen. Other: No evidence of abdominal wall mass or hernia. Musculoskeletal: No acute or significant osseous findings. IMPRESSION: 1. 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 No renal or collecting system calculi. 2. Moderate to large amount of right colonic stool with mildly prominent air-filled loops of transverse colon and descending colon. 3.  Cholelithiasis/layering sludge 4.  Aortic Atherosclerosis (ICD10-I70.0). 5. Moderate cardiomegaly Electronically Signed   By: Prudencio Pair M.D.   On: 04/09/2019 02:53    Radiology CT Renal Stone Study  Result Date: 04/09/2019 CLINICAL DATA:  Bilateral lower flank pain EXAM: CT ABDOMEN AND PELVIS WITHOUT CONTRAST TECHNIQUE: Multidetector CT imaging of the abdomen and pelvis was performed following the standard protocol without IV contrast. COMPARISON:  CT pelvis August 09, 2017 FINDINGS: Lower chest: There is moderate cardiomegaly. No hiatal hernia. Streaky atelectasis seen at both lung bases. A tortuous descending aorta is noted. Hepatobiliary: Although limited due to the lack of intravenous contrast, normal in appearance without  gross focal abnormality. Layering calcified gallstones/sludge is present. Pancreas:  Unremarkable.  No surrounding inflammatory changes. Spleen: Normal in size. Although limited due to the lack of intravenous contrast, normal in appearance. Adrenals/Urinary Tract: Both adrenal glands appear normal. Again seen is marked right pelvicaliectasis to the level of the UPJ the mid to distal ureter is decompressed. No renal calculi are noted. Multiple bilateral low-density lesions are seen within both kidneys. The largest within the midpole of the right kidney measuring 2 cm. The largest within the upper pole of left kidney is 4 cm. The bladder is unremarkable. Stomach/Bowel: The stomach, small bowel, and colon are normal in appearance. No inflammatory changes or obstructive findings. There  is a moderate to large amount of right colonic stool present. Mildly prominent air-filled loops of transverse colon and descending colon is seen. Scattered colonic diverticula are noted. Vascular/Lymphatic: Again noted are multiple bilateral retroperitoneal coarse calcifications. There is scattered aortic atherosclerosis. Reproductive: The patient is status post hysterectomy. No adnexal masses or collections seen. Other: No evidence of abdominal wall mass or hernia. Musculoskeletal: No acute or significant osseous findings. IMPRESSION: 1. 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 No renal or collecting system calculi. 2. Moderate to large amount of right colonic stool with mildly prominent air-filled loops of transverse colon and descending colon. 3.  Cholelithiasis/layering sludge 4.  Aortic Atherosclerosis (ICD10-I70.0). 5. Moderate cardiomegaly Electronically Signed   By: Prudencio Pair M.D.   On: 04/09/2019 02:53    Procedures Procedures (including critical care time)  Medications Ordered in ED Medications  fentaNYL (SUBLIMAZE) injection 50 mcg (50 mcg Intravenous Given 04/09/19 0226)   ondansetron (ZOFRAN) injection 4 mg (4 mg Intravenous Given 04/09/19 0226)  ketorolac (TORADOL) 30 MG/ML injection 30 mg (30 mg Intravenous Given 04/09/19 0346)    ED Course  I have reviewed the triage vital signs and the nursing notes.  Pertinent labs & imaging results that were available during my care of the patient were reviewed by me and considered in my medical decision making (see chart for details).    Patient has OI constipation.  It appears that the Fleets enema caused severe cramping and gas which is quite painful.  The CT and exam are consistent with this.  The fleets enema did not relieve the constipation as it is right sided and may have even been pre-cecal at the time it was instilled.  I have started the patient on BID miralax x 7 days and suggested a liquid diet of soup and juice and protein shakes x 2 days.  Speak with your doctor regarding medication for OIC.  Patient verbalizes understanding and agrees to follow up.   Daleisa Strege Petrizzo was evaluated in Emergency Department on 04/09/2019 for the symptoms described in the history of present illness. She was evaluated in the context of the global COVID-19 pandemic, which necessitated consideration that the patient might be at risk for infection with the SARS-CoV-2 virus that causes COVID-19. Institutional protocols and algorithms that pertain to the evaluation of patients at risk for COVID-19 are in a state of rapid change based on information released by regulatory bodies including the CDC and federal and state organizations. These policies and algorithms were followed during the patient's care in the ED.  Final Clinical Impression(s) / ED Diagnoses Final diagnoses:  Constipation, unspecified constipation type   Return for weakness, numbness, changes in vision or speech, fevers >100.4 unrelieved by medication, shortness of breath, intractable vomiting, or diarrhea, abdominal pain, Inability to tolerate liquids or food, cough,  altered mental status or any concerns. No signs of systemic illness or infection. The patient is nontoxic-appearing on exam and vital signs are within normal limits.   I have reviewed the triage vital signs and the nursing notes. Pertinent labs &imaging results that were available during my care of the patient were reviewed by me and considered in my medical decision making (see chart for details).  After history, exam, and medical workup I feel the patient has been appropriately medically screened and is safe for discharge home. Pertinent diagnoses were discussed with the patient. Patient was given return precautions  Rx / DC Orders ED Discharge Orders  Ordered    polyethylene glycol powder (MIRALAX) 17 GM/SCOOP powder  2 times daily     04/09/19 0258           Daxen Lanum, MD 04/09/19 0401

## 2019-04-10 ENCOUNTER — Telehealth (HOSPITAL_COMMUNITY): Payer: Self-pay | Admitting: Cardiology

## 2019-04-10 NOTE — Telephone Encounter (Signed)
Received vm with concerns of leg swelling   lmom

## 2019-04-11 NOTE — Telephone Encounter (Signed)
Patient reports R>L lower extremity swelling No weight to reports No increased SOB Reports medication compliance Reports sitting with her feet elevated through out the day  denies using compression stockings   Advised would forward to provider for further medication advised

## 2019-04-11 NOTE — Telephone Encounter (Signed)
She can take an extra 40 mg Lasix in the morning for 2 days.

## 2019-04-12 NOTE — Telephone Encounter (Signed)
Pt aware and voiced understanding 

## 2019-04-17 DIAGNOSIS — K148 Other diseases of tongue: Secondary | ICD-10-CM | POA: Diagnosis not present

## 2019-04-17 DIAGNOSIS — H903 Sensorineural hearing loss, bilateral: Secondary | ICD-10-CM | POA: Diagnosis not present

## 2019-04-18 ENCOUNTER — Non-Acute Institutional Stay: Payer: Medicare Other | Admitting: Internal Medicine

## 2019-04-18 ENCOUNTER — Other Ambulatory Visit: Payer: Self-pay

## 2019-04-18 ENCOUNTER — Encounter: Payer: Self-pay | Admitting: Internal Medicine

## 2019-04-18 ENCOUNTER — Telehealth (HOSPITAL_COMMUNITY): Payer: Self-pay

## 2019-04-18 VITALS — BP 110/62 | HR 97 | Temp 98.1°F | Ht 60.0 in | Wt 105.0 lb

## 2019-04-18 DIAGNOSIS — M8000XD Age-related osteoporosis with current pathological fracture, unspecified site, subsequent encounter for fracture with routine healing: Secondary | ICD-10-CM | POA: Diagnosis not present

## 2019-04-18 DIAGNOSIS — K5903 Drug induced constipation: Secondary | ICD-10-CM | POA: Diagnosis not present

## 2019-04-18 DIAGNOSIS — M545 Low back pain, unspecified: Secondary | ICD-10-CM

## 2019-04-18 DIAGNOSIS — T402X5A Adverse effect of other opioids, initial encounter: Secondary | ICD-10-CM

## 2019-04-18 NOTE — Progress Notes (Addendum)
Location:  Occupational psychologist of Service:  Clinic (12)  Provider: Dallyn Bergland L. Mariea Clonts, D.O., C.M.D.  Code Status: DNR Goals of Care:  Advanced Directives 04/18/2019  Does Patient Have a Medical Advance Directive? Yes  Type of Advance Directive Out of facility DNR (pink MOST or yellow form);Healthcare Power of Attorney  Does patient want to make changes to medical advance directive? No - Patient declined  Copy of [place] in Chart? -  Would patient like information on creating a medical advance directive? -  Pre-existing out of facility DNR order (yellow form or pink MOST form) -     Chief Complaint  Patient presents with  . Follow-up    discharged 04/09/19    HPI: Patient is a 86 y.o. female seen today for f/u after hospitalization 2/28-3/1 for opioid-induced constipation--had severe pain especially in the right lower quadrant hernia, but then spread to her whole abdomen.  She'd already been constipated that week.  She had plans and thought she'd do a fleets enema, but then pain came over her RLQ hernia and only a little stool came.  They had her do miralax bid for a week after she left the ED.  She wound up with diarrhea and horrible cramping.    She had two bms and did not take miralax that day.  She had one bm today and took one dose of miralax.  BM is now loose.  No residual pain or discomfort in her abdomen.  BMs are a normal color for her.  She does not see that well so she's not sure if there was any blood in it.  She has hemorrhoids but not bothersome.     Her daughter got her premier protein shakes when she was on the liquid diet post-ED and she says it does not taste bad either. No chalky aftertaste.  She's taking chocolate.    For her constipation, we previously tried symproic, movantik and amitiza for her in the past.  Cost was prohibitive and benefits were limited.  She would like to continue with miralax and just adjust according to  the consistency and frequency of her stools.  Past Medical History:  Diagnosis Date  . Abnormal stress test    a. 11/2011 Ex MV: EF 80%, small, partially reversible anteroapical defect->mild ischemia vs attenuation-->med Rx.  [name] Kitchen Alopecia, unspecified   . Arthritis   . Chronic diastolic CHF (congestive heart failure) ([facility])    a. 11/2013 Echo: EF 60-65%, no rwma, mild TR, PASP 61mmHg.  . Closed fracture of lower end of radius with ulna   . Deafness    Left, s/p multiple surgeries  . Essential hypertension   . Gouty arthropathy, unspecified   . History of kidney stones   . Hypothyroidism   . Insomnia, unspecified   . Macular degeneration    Left, s/p inj. tx  . Mitral valve disorders(424.0)   . Neck mass    right, w/u including MRI negative  . PAF (paroxysmal atrial fibrillation) (HCC)    a. on amio (02/2012 mild obstruction on PFT's) and xarelto.  . Polycythemia vera(238.4)   . Pure hypercholesterolemia   . Senile osteoporosis    Reclast in the past  . Tricuspid valve disorders, specified as nonrheumatic     Past Surgical History:  Procedure Laterality Date  . BREAST BIOPSY  06/18/1998   left  . CARDIOVERSION N/A 07/15/2017   Procedure: CARDIOVERSION;  Surgeon: Larey Dresser, MD;  Location:  [facility] ENDOSCOPY;  Service: Cardiovascular;  Laterality: N/A;  . CARDIOVERSION N/A 07/21/2018   Procedure: CARDIOVERSION;  Surgeon: Larey Dresser, MD;  Location: [facility] ENDOSCOPY;  Service: Cardiovascular;  Laterality: N/A;  . CARDIOVERSION N/A 10/06/2018   Procedure: CARDIOVERSION;  Surgeon: Larey Dresser, MD;  Location: [facility] ENDOSCOPY;  Service: Cardiovascular;  Laterality: N/A;  . ELECTROPHYSIOLOGIC STUDY N/A 01/08/2016   Procedure: Atrial Fibrillation Ablation;  Surgeon: Thompson Grayer, MD;  Location: [facility] CV LAB;  Service: Cardiovascular;  Laterality: N/A;  . FEMUR IM NAIL Right 10/08/2015   Procedure: INTRAMEDULLARY (IM) NAIL FEMORAL RIGHT;  Surgeon: Paralee Cancel, MD;  Location: [facility]  ORS;  Service: Orthopedics;  Laterality: Right;  . MASS EXCISION Right 11/26/2016   Procedure: EXCISION RIGHT LATERAL NECK MASS;  Surgeon: Jerrell Belfast, MD;  Location: [facility];  Service: ENT;  Laterality: Right;  . TEE WITHOUT CARDIOVERSION N/A 07/15/2017   Procedure: TRANSESOPHAGEAL ECHOCARDIOGRAM (TEE);  Surgeon: Larey Dresser, MD;  Location: [facility] ENDOSCOPY;  Service: Cardiovascular;  Laterality: N/A;  . TONSILLECTOMY    . TOTAL ABDOMINAL HYSTERECTOMY    . TYMPANOPLASTY Bilateral     Allergies  Allergen Reactions  . Amlodipine Swelling and Other (See Comments)    Lower extremity swelling   . Augmentin [Amoxicillin-Pot Clavulanate] Other (See Comments)    Stomach Pain/cramps Did it involve swelling of the face/tongue/throat, SOB, or low BP? No Did it involve sudden or severe rash/hives, skin peeling, or any reaction on the inside of your mouth or nose? No Did you need to seek medical attention at a hospital or doctor's office? Unknown When did it last happen?unknown If all above answers are "NO", may proceed with cephalosporin use.     Outpatient Encounter Medications as of 04/18/2019  Medication Sig  . amiodarone (PACERONE) 200 MG tablet Take 1 tablet (200 mg total) by mouth daily.  [name] Kitchen apixaban (ELIQUIS) 2.5 MG TABS tablet Take 1 tablet (2.5 mg total) by mouth 2 (two) times daily.  . Cholecalciferol (VITAMIN D) 2000 units CAPS Take 2,000 Units by mouth daily.   . fentaNYL (DURAGESIC) 12 MCG/HR Place 1 patch onto the skin every 3 (three) days.  . furosemide (LASIX) 80 MG tablet TAKE 1 TABLET BY MOUTH TWICE DAILY.  [name] Kitchen gabapentin (NEURONTIN) 100 MG capsule TAKE THREE (3) CAPSULES AT BEDTIME.  . hydrALAZINE (APRESOLINE) 50 MG tablet TAKE 1 1/2 TABLETS THREE TIMES DAILY.  . hydroxyurea (HYDREA) 500 MG capsule TAKE 1 CAPSULE ON MONDAY, WEDNESDAY,AND FRIDAY, AND 2 CAPSULES THE OTHER DAYS OF THE WEEK.  [name] Kitchen levothyroxine (SYNTHROID) 100 MCG tablet TAKE 1 TABLET ONCE DAILY BEFORE  BREAKFAST.  . Magnesium 250 MG TABS Take 250 mg by mouth daily.   . Melatonin 10 MG TABS Take 10 mg by mouth at bedtime.  . Multiple Vitamins-Minerals (PRESERVISION AREDS 2) CAPS Take 1 capsule by mouth 2 (two) times daily.   . polyethylene glycol powder (MIRALAX) 17 GM/SCOOP powder Take 17 g by mouth in the morning and at bedtime. For 7 days  . potassium chloride (KLOR-CON) 10 MEQ tablet TAKE 2 TABLETS IN THE MORNING AND 1 TABLET IN THE EVENING.  . sennosides-docusate sodium (SENOKOT-S) 8.6-50 MG tablet Take 2 tablets by mouth 2 (two) times daily.  . sodium chloride (OCEAN) 0.65 % SOLN nasal spray Place 2 sprays into both nostrils 2 (two) times daily.   . traMADol (ULTRAM) 50 MG tablet TAKE ONE TABLET AT BEDTIME.   No facility-administered encounter medications on file as of 04/18/2019.  Review of Systems:  Review of Systems  Constitutional: Negative for chills, fever and malaise/fatigue.  HENT: Positive for hearing loss.   Eyes: Negative for blurred vision.  Respiratory: Negative for cough and shortness of breath.   Cardiovascular: Negative for chest pain, palpitations and leg swelling.  Gastrointestinal: Positive for constipation. Negative for abdominal pain, blood in stool, diarrhea, heartburn, melena, nausea and vomiting.       Right inguinal/femoral hernia  Genitourinary: Negative for dysuria.  Musculoskeletal: Positive for back pain. Negative for falls.  Skin:       Tender area on right leg where there is prominent hyperpigmentation  Neurological: Negative for dizziness and loss of consciousness.  Endo/Heme/Allergies: Bruises/bleeds easily.  Psychiatric/Behavioral: Negative for depression and memory loss. The patient is not nervous/anxious and does not have insomnia.     Health Maintenance  Topic Date Due  . TETANUS/TDAP  01/13/2027  . INFLUENZA VACCINE  Completed  . DEXA SCAN  Completed  . PNA vac Low Risk Adult  Completed    Physical Exam: Vitals:   04/18/19 1131    BP: 110/62  Pulse: 97  Temp: 98.1 F (36.7 C)  TempSrc: Temporal  SpO2: 94%  Weight: 105 lb (47.6 kg)  Height: 5' (1.524 m)   Body mass index is 20.51 kg/m. Physical Exam Vitals reviewed.  Constitutional:      General: She is not in acute distress.    Appearance: Normal appearance. She is not ill-appearing or toxic-appearing.  HENT:     Head: Normocephalic and atraumatic.     Ears:     Comments: Hearing loss less noticeable today Eyes:     Comments: Vision poor  Cardiovascular:     Rate and Rhythm: Normal rate and regular rhythm.     Pulses: Normal pulses.     Heart sounds: Normal heart sounds.  Pulmonary:     Effort: Pulmonary effort is normal.     Breath sounds: Normal breath sounds. No wheezing, rhonchi or rales.  Abdominal:     General: Bowel sounds are normal. There is distension.     Palpations: Abdomen is soft. There is no mass.     Tenderness: There is no abdominal tenderness. There is no guarding or rebound.     Hernia: A hernia is present.     Comments: Right femoral Hernia is reducible, nontender at present; appears to have diasthesis recti with prominence of abdomen when lying flat and laughs and when goes to sit up (no outright ventral hernia)  Musculoskeletal:        General: Normal range of motion.     Right lower leg: No edema.     Left lower leg: No edema.     Comments: Using rolling walker with skis  Skin:    General: Skin is warm and dry.     Comments: Increased spider veins bilaterally with hyperpigmented area on right anterolateral shin with prominent "bubble" of vein that is tender amid the area  Neurological:     General: No focal deficit present.     Mental Status: She is alert and oriented to person, place, and time.  Psychiatric:        Mood and Affect: Mood normal.        Behavior: Behavior normal.        Thought Content: Thought content normal.        Judgment: Judgment normal.     Labs reviewed: Basic Metabolic Panel: Recent Labs     10/13/18 1144 10/13/18  1144 01/22/19 1430 04/09/19 0129 04/09/19 0157  NA 137   < > 139 134* 134*  K 4.1   < > 4.0 3.7 3.6  CL 101   < > 98 97* 97*  CO2 27  --  29 24  --   GLUCOSE 79   < > 77 88 97  BUN 24*   < > 25* 32* 30*  CREATININE 1.17*   < > 1.34* 1.27* 1.50*  CALCIUM 10.1  --  10.2 9.5  --   MG 2.4  --   --   --   --   TSH 3.622  --   --   --   --    < > = values in this interval not displayed.   Liver Function Tests: Recent Labs    10/13/18 1144 04/09/19 0129  AST 15 20  ALT 17 16  ALKPHOS 44 86  BILITOT 0.6 1.1  PROT 6.4* 7.2  ALBUMIN 4.2 4.9   No results for input(s): LIPASE, AMYLASE in the last 8760 hours. No results for input(s): AMMONIA in the last 8760 hours. CBC: Recent Labs    08/14/18 1305 10/06/18 0708 10/13/18 1144 10/13/18 1144 12/22/18 1036 04/09/19 0129 04/09/19 0157  WBC 7.1  --  6.8  --  9.8 10.6*  --   NEUTROABS 5.2  --   --   --  7.8*  --   --   HGB 11.5*   < > 12.1   < > 12.3 15.0 15.6*  HCT 35.8*   < > 37.1   < > 37.4 44.0 46.0  MCV 114.7*  --  119.7*  --  117.6* 113.4*  --   PLT 360  --  527*  --  423* 534*  --    < > = values in this interval not displayed.   Lipid Panel: No results for input(s): CHOL, HDL, LDLCALC, TRIG, CHOLHDL, LDLDIRECT in the last 8760 hours. No results found for: HGBA1C  Procedures since last visit: CT Renal Stone Study  Result Date: 04/09/2019 CLINICAL DATA:  Bilateral lower flank pain EXAM: CT ABDOMEN AND PELVIS WITHOUT CONTRAST TECHNIQUE: Multidetector CT imaging of the abdomen and pelvis was performed following the standard protocol without IV contrast. COMPARISON:  CT pelvis August 09, 2017 FINDINGS: Lower chest: There is moderate cardiomegaly. No hiatal hernia. Streaky atelectasis seen at both lung bases. A tortuous descending aorta is noted. Hepatobiliary: Although limited due to the lack of intravenous contrast, normal in appearance without gross focal abnormality. Layering calcified  gallstones/sludge is present. Pancreas:  Unremarkable.  No surrounding inflammatory changes. Spleen: Normal in size. Although limited due to the lack of intravenous contrast, normal in appearance. Adrenals/Urinary Tract: Both adrenal glands appear normal. Again seen is marked right pelvicaliectasis to the level of the UPJ the mid to distal ureter is decompressed. No renal calculi are noted. Multiple bilateral low-density lesions are seen within both kidneys. The largest within the midpole of the right kidney measuring 2 cm. The largest within the upper pole of left kidney is 4 cm. The bladder is unremarkable. Stomach/Bowel: The stomach, small bowel, and colon are normal in appearance. No inflammatory changes or obstructive findings. There is a moderate to large amount of right colonic stool present. Mildly prominent air-filled loops of transverse colon and descending colon is seen. Scattered colonic diverticula are noted. Vascular/Lymphatic: Again noted are multiple bilateral retroperitoneal coarse calcifications. There is scattered aortic atherosclerosis. Reproductive: The patient is status post hysterectomy. No adnexal masses  or collections seen. Other: No evidence of abdominal wall mass or hernia. Musculoskeletal: No acute or significant osseous findings. IMPRESSION: 1. 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 No renal or collecting system calculi. 2. Moderate to large amount of right colonic stool with mildly prominent air-filled loops of transverse colon and descending colon. 3.  Cholelithiasis/layering sludge 4.  Aortic Atherosclerosis (ICD10-I70.0). 5. Moderate cardiomegaly Electronically Signed   By: Prudencio Pair M.D.   On: 04/09/2019 02:53    Assessment/Plan 1. Therapeutic opioid-induced constipation (OIC) -cont using miralax--she is good with high fiber and water intake, walking for exercise -she is adjusting the miralax according to how her bowels move and  I asked her to keep track of her bms and miralax use in case she has a problem so we can tell her how to tweak what she is doing--she was not thrilled but agreeable to doing this -cost of prescription opioid induced constipation meds was prohibitive and benefit limited  2. Age-related osteoporosis with current pathological fracture with routine healing, subsequent encounter -cont prolia and vitamin D3 and weightbearing exercise  3. Lumbar back pain -continues with epidurals when needed, but chronic fentanyl and tramadol for pain--cannot manage without these, unfortunately  Medications were reconciled   Labs/tests ordered:  No new today Next appt:  08/15/2019  Gladie Gravette L. Thanya Cegielski, D.O. [facility] [address], [place] [address] Cell Phone (Mon-Fri 8am-5pm):  [phone] On Call:  [phone] & follow prompts after 5pm & weekends Office Phone:  [phone] Office Fax:  [phone]

## 2019-04-18 NOTE — Telephone Encounter (Signed)

## 2019-04-19 ENCOUNTER — Other Ambulatory Visit: Payer: Medicare Other

## 2019-04-19 ENCOUNTER — Ambulatory Visit (HOSPITAL_COMMUNITY)
Admission: RE | Admit: 2019-04-19 | Discharge: 2019-04-19 | Disposition: A | Discharge status: Home / Self Care | Payer: Medicare Other | Source: Ambulatory Visit | Attending: Cardiology | Admitting: Cardiology

## 2019-04-19 ENCOUNTER — Ambulatory Visit: Payer: Medicare Other | Admitting: Hematology and Oncology

## 2019-04-19 ENCOUNTER — Other Ambulatory Visit: Payer: Self-pay

## 2019-04-19 VITALS — BP 120/80 | HR 106 | Wt 107.8 lb

## 2019-04-19 DIAGNOSIS — Z87891 Personal history of nicotine dependence: Secondary | ICD-10-CM | POA: Diagnosis not present

## 2019-04-19 DIAGNOSIS — E039 Hypothyroidism, unspecified: Secondary | ICD-10-CM | POA: Insufficient documentation

## 2019-04-19 DIAGNOSIS — R001 Bradycardia, unspecified: Secondary | ICD-10-CM | POA: Insufficient documentation

## 2019-04-19 DIAGNOSIS — I340 Nonrheumatic mitral (valve) insufficiency: Secondary | ICD-10-CM | POA: Insufficient documentation

## 2019-04-19 DIAGNOSIS — Z880 Allergy status to penicillin: Secondary | ICD-10-CM | POA: Insufficient documentation

## 2019-04-19 DIAGNOSIS — I4819 Other persistent atrial fibrillation: Secondary | ICD-10-CM

## 2019-04-19 DIAGNOSIS — I5032 Chronic diastolic (congestive) heart failure: Secondary | ICD-10-CM | POA: Diagnosis not present

## 2019-04-19 DIAGNOSIS — I495 Sick sinus syndrome: Secondary | ICD-10-CM | POA: Insufficient documentation

## 2019-04-19 DIAGNOSIS — Z888 Allergy status to other drugs, medicaments and biological substances status: Secondary | ICD-10-CM | POA: Insufficient documentation

## 2019-04-19 DIAGNOSIS — I4891 Unspecified atrial fibrillation: Secondary | ICD-10-CM | POA: Insufficient documentation

## 2019-04-19 DIAGNOSIS — I110 Hypertensive heart disease with heart failure: Secondary | ICD-10-CM | POA: Insufficient documentation

## 2019-04-19 DIAGNOSIS — Z881 Allergy status to other antibiotic agents status: Secondary | ICD-10-CM | POA: Diagnosis not present

## 2019-04-19 DIAGNOSIS — I484 Atypical atrial flutter: Secondary | ICD-10-CM | POA: Diagnosis present

## 2019-04-19 MED ORDER — TORSEMIDE 20 MG PO TABS: 60.0000 mg | ORAL_TABLET | Freq: Every day | ORAL | 2 refills | Status: DC

## 2019-04-19 MED ORDER — METOPROLOL SUCCINATE ER 25 MG PO TB24: 25.0000 mg | ORAL_TABLET | Freq: Two times a day (BID) | ORAL | 6 refills | Status: DC

## 2019-04-19 NOTE — Progress Notes (Signed)
Date:  01/19/2019   ID:  Sarah Mays, DOB Mar 29, 1930, MRN [id number]   Provider location: [facility] Type of Visit: Established patient  PCP:  Gayland Curry, DO  Cardiologist:  Loralie Champagne, MD  Chief Complaint: Shortness of breath   History of Present Illness: Sarah Mays is a 86 y.o. female who has a history of paroxysmal atrial fibrillation and chronic diastolic CHF.  In 7/16, she was admitted with chest pain.  Cardiolite showed no ischemia or infarction.  Echo in 6/17 showed EF 60-65%.  She developed problems elevated HR when in atrial fibrillation but bradycardia when in NSR.  She underwent atrial fibrillation ablation in 11/17.  She is tolerating Xarelto without melena or BRBPR.  Dr. Rayann Heman had her stop amiodarone.    Echo in 11/18 showed EF 60-65%, PASP 47 mmHg.   TEE-guided DCCV back to NSR in 6/19.  TEE showed EF 55-60% with moderate-severe MR.   She developed multiple episodes of epistaxis with Xarelto use.  We have transitioned her to apixaban 2.5 mg bid and she has not had epistaxis since then.   Echo in 6/20 showed EF 60-65%, normal RV, mild-moderate MR.   She had recurrent atrial fibrillation with DCCV to NSR in 6/20.  She then had atypical flutter with DCCV to NSR in 8/20. Symptomatically worse when in atrial fibrillation/flutter.  At last visit, she was in junctional rhythm. Zio patch x 3 days in 9/20 showed 51% atrial flutter with controlled rate. She saw Dr. Rayann Heman and they decided against repeat ablation.   She returns for followup of CHF and atrial fibrillation/flutter.  She was in atypical atrial flutter in 12/20 and is still in atypical flutter today.  She walks with a walker for balance. She is short of breath after walking about 100 yards.  No chest pain.  No orthopnea/PND. She does not feel palpitations.   ECG (personally reviewed): atrial flutter rate 105  Labs (7/16): K 4, creatinine 0.94, calcium 10.8, LFTs  normal Labs (9/16): K 4, creatinine 0.9, LDL 69, TSH normal, HCT 42.3 Labs (5/17): K 4.6, creatinine 0.94, HCT 41.5, LFTs normal. Labs (6/17): K 5, creatinine 1.04, LDL 42, HDL 101, TSH normal Labs (12/17): K 3.7, creatinine 0.76, hgb 11.2 Labs (4/18): K 4.6, creatinine 0.91 Labs (10/18): K 4.5, creatinine 0.62 Labs (12/18): hgb 13.5 Labs (1/19): K 4.3, creatinine 0.6 Labs (4/19): hgb 13.5 Labs (7/19): K 4.5, creatinine 0.5, hgb 12.1 Labs (11/19): K 3.7, creatinine 0.95 Labs (12/19): K 3.6, creatinine 0.9, hgb 11.6 Labs (6/20): K 3.7, creatinine 1.01 => 1.17 Labs (9/20): K 4.1, creatinine 1.17 Labs (3/21): K 3.7, creatinine 1.27  PMH: 1. Atrial fibrillation/flutter: Diagnosed initially in 10/13.  Holter monitor in 11/13 showed atrial fibrillation with average rate 65.  - Atrial fibrillation ablation in 11/17.  - TEE-guided DCCV in 6/19.  - atrial fibrillation with DCCV to NSR in 6/20.  - atypical atrial flutter with DCCV to NSR in 8/20.  - Zio patch x 3 days (9/20): 51% atrial flutter 2. Chronic diastolic CHF: Echo ([id number]) with EF 65-70%, mild MR, moderate biatrial enlargement, moderate TR, PA systolic pressure 45 mmHg.   Echo (10/15): EF 60-65%.  Echo (7/16) with EF 60-65%, mild MR.  - Echo (6/17): EF 60-65%, PASP 41 mmHg.  - Echo (11/18): EF 60-65%, PASP 47 mmHg - TEE (6/19): EF 55-60%, mild LVH, mildly decreased RV systolic function, peak RV-RA gradient 40 mmHg, moderate-severe MR with  bileaflet MVP, ERO 0.37 cm^2.  - Echo (6/20): EF 60-65%, normal RV, mild-moderate MR, severe biatrial enlargement.  3. ETT-Sestamibi (11/13): Exercised to stage II, small partially reversible anteroapical perfusion defect suggesting mild ischemia versus attenuation, EF 80%.  Cardiolite (7/16) with EF 64%, no ischemia or infarction.  4. HTN: Lower extremity swelling with amlodipine.  5. Polycythemia vera: Has had phlebotomy only once.  6. Hypothyroidism 7. Macular degeneration 8. TAH 1980 9.  Familial hypocalciuric hypercalcemia 10. PFTs (amiodarone use) in 1/14 showed a mild obstructive defect.  11. Degenerative disc disease 12. Sick sinus syndrome: Has not required PPM. Junctional rhythm has been noted in the past.  13. Mitral regurgitation: TEE (6/19) with moderate-severe MR with bileaflet MVP, ERO 0.37 cm^2.  - Echo (6/20) with mild-moderate MR.   Current Outpatient Medications  Medication Sig Dispense Refill  . apixaban (ELIQUIS) 2.5 MG TABS tablet Take 1 tablet (2.5 mg total) by mouth 2 (two) times daily. 180 tablet 3  . Cholecalciferol (VITAMIN D) 2000 units CAPS Take 2,000 Units by mouth daily.     . fentaNYL (DURAGESIC) 12 MCG/HR Place 1 patch onto the skin every 3 (three) days. 10 patch 0  . gabapentin (NEURONTIN) 100 MG capsule TAKE THREE (3) CAPSULES AT BEDTIME. 90 capsule 0  . hydrALAZINE (APRESOLINE) 50 MG tablet TAKE 1 1/2 TABLETS THREE TIMES DAILY. 135 tablet 1  . hydroxyurea (HYDREA) 500 MG capsule TAKE 1 CAPSULE ON MONDAY, WEDNESDAY,AND FRIDAY, AND 2 CAPSULES THE OTHER DAYS OF THE WEEK. 100 capsule 0  . levothyroxine (SYNTHROID) 100 MCG tablet TAKE 1 TABLET ONCE DAILY BEFORE BREAKFAST. 90 tablet 1  . Magnesium 250 MG TABS Take 250 mg by mouth daily.     . Melatonin 10 MG TABS Take 10 mg by mouth at bedtime.    . Multiple Vitamins-Minerals (PRESERVISION AREDS 2) CAPS Take 1 capsule by mouth 2 (two) times daily.     . polyethylene glycol powder (MIRALAX) 17 GM/SCOOP powder Take 17 g by mouth in the morning and at bedtime. For 7 days 255 g 0  . potassium chloride (KLOR-CON) 10 MEQ tablet TAKE 2 TABLETS IN THE MORNING AND 1 TABLET IN THE EVENING. 90 tablet 6  . sennosides-docusate sodium (SENOKOT-S) 8.6-50 MG tablet Take 2 tablets by mouth 2 (two) times daily.    . sodium chloride (OCEAN) 0.65 % SOLN nasal spray Place 2 sprays into both nostrils 2 (two) times daily.     . traMADol (ULTRAM) 50 MG tablet TAKE ONE TABLET AT BEDTIME. 30 tablet 0  . metoprolol succinate  (TOPROL-XL) 25 MG 24 hr tablet Take 1 tablet (25 mg total) by mouth 2 (two) times daily. 60 tablet 6  . torsemide (DEMADEX) 20 MG tablet Take 3 tablets (60 mg total) by mouth daily. 276 tablet 2   No current facility-administered medications for this encounter.    Allergies:   Amlodipine and Augmentin [amoxicillin-pot clavulanate]   Social History:  The patient  reports that she quit smoking about 35 years ago. Her smoking use included cigarettes. She has never used smokeless tobacco. She reports previous alcohol use of about 1.0 standard drinks of alcohol per week. She reports that she does not use drugs.   Family History:  The patient's family history includes Breast cancer in her daughter; Cancer in her daughter, sister, sister, and sister; Heart disease in her father and sister; Hypertension in her father; Ovarian cancer (age of onset: 25) in her mother.   ROS:  Please see the  history of present illness.   All other systems are personally reviewed and negative.   Exam:   BP 120/80   Pulse (!) 106   Wt 48.9 kg (107 lb 12.8 oz)   SpO2 96%   BMI 21.05 kg/m  General: NAD Neck: JVP 8-9 cm, no thyromegaly or thyroid nodule.  Lungs: Clear to auscultation bilaterally with normal respiratory effort. CV: Nondisplaced PMI.  Heart irregular S1/S2, no S3/S4, no murmur.  No peripheral edema.  No carotid bruit.  Normal pedal pulses.  Abdomen: Soft, nontender, no hepatosplenomegaly, no distention.  Skin: Intact without lesions or rashes.  Neurologic: Alert and oriented x 3.  Psych: Normal affect. Extremities: No clubbing or cyanosis.  HEENT: Normal.    Recent Labs: 10/13/2018: Magnesium 2.4; TSH 3.622 01/22/2019: B Natriuretic Peptide 534.5 04/09/2019: ALT 16; BUN 30; Creatinine, Ser 1.50; Hemoglobin 15.6; Platelets 534; Potassium 3.6; Sodium 134  Personally reviewed   Wt Readings from Last 3 Encounters:  04/19/19 48.9 kg (107 lb 12.8 oz)  04/18/19 47.6 kg (105 lb)  04/09/19 48.5 kg (107  lb)      ASSESSMENT AND PLAN:  1. Atrial fibrillation/flutter: First noted in 10/13.  Atrial fibrillation has triggered acute on chronic diastolic CHF in the past.  CHADSVASC score is 13 (age, female gender, HTN, CHF).  She is anticoagulated with apixaban 2.5 mg bid which is appropriate for her age/weight.  She had atrial fibrillation ablation in 11/17.  TEE-guided DCCV in 6/19.  Cardioversion in 6/20 for atrial fibrillation and again in 8/20 for atypical atrial flutter.  Zio patch in 9/20 showed 51% atrial flutter.  Today and in 12/20, she has been in flutter.  She saw Dr. Rayann Heman who thought rate-control would probably be the best strategy, recommended against repeat ablation.   - Stop amiodarone as it is not keeping her in NSR.  I will have her start Toprol XL 25 mg bid for rate control.    - Continue apixaban 2.5 mg bid.  2. Chronic diastolic CHF:  Echo was done in 6/20 with EF 60-65%, normal RV, mild-moderate MR.  NYHA class II-III.  She is mildly volume overloaded on exam.   - Stop Lasix.  Start torsemide 60 qam/40 qpm x 3 days then torsemide 60 mg daily. BMET in 10 days.    - Continue KCl 20 qam/10 qpm.  3. HTN: BP has been reasonably controlled, continue current meds.     4. Bradycardia: Suspect she has a degree of sick sinus syndrome. Prior junctional rhythm.  However, it looks now like she is in persistent atypical atrial flutter.  - Follow rhythm closely on Toprol XL.   5. Mitral regurgitation: Moderate to severe MR in setting of bileaflet prolapse on TEE in 6/19. However, echo in 6/20 showed only mild-moderate MR.   Followup in 10 days with NP/PA.    Signed, Loralie Champagne, MD  04/19/2019  [facility] [address] [facility] [place] [address] [phone] (office) [phone] (fax)

## 2019-04-19 NOTE — Patient Instructions (Signed)
STOP Amiodarone     STOP Lasix   START Toprol XL 25mg  (1 tab) twice a day   START Torsemide 60mg  (3 tabs) in the morning and 40mg  (2 tabs) in the evening for 3 days THEN 60mg  (3 tabs) daily after that   Your physician recommends that you schedule a follow-up appointment in: 2 weeks with the Nurse Practitioner.   Please call office at [phone] option [phone] if you have any questions or concerns.     At the [facility], you and your health needs are our priority. As part of our continuing mission to provide you with exceptional heart care, we have created designated Provider Care Teams. These Care Teams include your primary Cardiologist (physician) and Advanced Practice Providers (APPs- Physician Assistants and Nurse Practitioners) who all work together to provide you with the care you need, when you need it.   You may see any of the following providers on your designated Care Team at your next follow up: [name] Kitchen Dr Glori Bickers . Dr Loralie Champagne . Darrick Grinder, NP . Lyda Jester, PA . Audry Riles, PharmD   Please be sure to bring in all your medications bottles to every appointment.

## 2019-04-19 NOTE — Addendum Note (Signed)
Addended by: Gayland Curry on: 04/19/2019 05:34 AM   Modules accepted: Level of Service

## 2019-04-25 ENCOUNTER — Other Ambulatory Visit: Payer: Self-pay

## 2019-04-25 DIAGNOSIS — M545 Low back pain, unspecified: Secondary | ICD-10-CM

## 2019-04-25 MED ORDER — FENTANYL 12 MCG/HR TD PT72: 1.0000 | MEDICATED_PATCH | TRANSDERMAL | 0 refills | Status: DC

## 2019-04-25 NOTE — Telephone Encounter (Signed)
Patient called and states that she used her last FentaNYL patch today and that she needs another patch for Saturday. Patient was last seen by you in nursing home on 04/18/2019. Opoid contract on file and was signed 02/20/2019. Patient states that pharmacy on file is fine. Please Advise.

## 2019-04-26 ENCOUNTER — Other Ambulatory Visit: Payer: Self-pay

## 2019-04-26 ENCOUNTER — Telehealth: Payer: Self-pay | Admitting: Hematology and Oncology

## 2019-04-26 ENCOUNTER — Other Ambulatory Visit: Payer: Self-pay | Admitting: Internal Medicine

## 2019-04-26 ENCOUNTER — Encounter: Payer: Self-pay | Admitting: Hematology and Oncology

## 2019-04-26 ENCOUNTER — Inpatient Hospital Stay (HOSPITAL_BASED_OUTPATIENT_CLINIC_OR_DEPARTMENT_OTHER): Payer: Medicare Other | Admitting: Hematology and Oncology

## 2019-04-26 ENCOUNTER — Telehealth: Payer: Self-pay

## 2019-04-26 ENCOUNTER — Inpatient Hospital Stay: Payer: Medicare Other | Attending: Hematology and Oncology

## 2019-04-26 DIAGNOSIS — Z9221 Personal history of antineoplastic chemotherapy: Secondary | ICD-10-CM | POA: Insufficient documentation

## 2019-04-26 DIAGNOSIS — M545 Low back pain, unspecified: Secondary | ICD-10-CM

## 2019-04-26 DIAGNOSIS — Z79899 Other long term (current) drug therapy: Secondary | ICD-10-CM | POA: Insufficient documentation

## 2019-04-26 DIAGNOSIS — D45 Polycythemia vera: Secondary | ICD-10-CM | POA: Insufficient documentation

## 2019-04-26 LAB — CBC WITH DIFFERENTIAL/PLATELET
Abs Immature Granulocytes: 0.04 10*3/uL (ref 0.00–0.07)
Basophils Absolute: 0.1 10*3/uL (ref 0.0–0.1)
Basophils Relative: 1 %
Eosinophils Absolute: 0.1 10*3/uL (ref 0.0–0.5)
Eosinophils Relative: 1 %
HCT: 38.6 % (ref 36.0–46.0)
Hemoglobin: 12.5 g/dL (ref 12.0–15.0)
Immature Granulocytes: 0 %
Lymphocytes Relative: 10 %
Lymphs Abs: 1.1 10*3/uL (ref 0.7–4.0)
MCH: 38.6 pg — ABNORMAL HIGH (ref 26.0–34.0)
MCHC: 32.4 g/dL (ref 30.0–36.0)
MCV: 119.1 fL — ABNORMAL HIGH (ref 80.0–100.0)
Monocytes Absolute: 0.8 10*3/uL (ref 0.1–1.0)
Monocytes Relative: 8 %
Neutro Abs: 8.4 10*3/uL — ABNORMAL HIGH (ref 1.7–7.7)
Neutrophils Relative %: 80 %
Platelets: 648 10*3/uL — ABNORMAL HIGH (ref 150–400)
RBC: 3.24 MIL/uL — ABNORMAL LOW (ref 3.87–5.11)
RDW: 17.6 % — ABNORMAL HIGH (ref 11.5–15.5)
WBC: 10.4 10*3/uL (ref 4.0–10.5)
nRBC: 0 % (ref 0.0–0.2)

## 2019-04-26 MED ORDER — HYDROXYUREA 500 MG PO CAPS: ORAL_CAPSULE | ORAL | 0 refills | Status: DC

## 2019-04-26 NOTE — Telephone Encounter (Signed)
Scheduled per 3/18 sch msg. Called and spoke with pt, confirmed 4/16 appt

## 2019-04-26 NOTE — Telephone Encounter (Signed)
She called and left a message to call her about a medication.  Called back. She wanted to clarify show to take medication but she found Dr. Alvy Bimler instructions is knows how to take now.

## 2019-04-26 NOTE — Assessment & Plan Note (Signed)
Her CBC shows platelets continue to trend up The patient stated she has been compliant taking medications as directed I recommend increasing the dose of hydroxyurea, from 11 capsules/week to 12 capsules/week I put in writing, for her to take hydroxyurea, 2 capsules daily from Mondays to Fridays and 1 capsule daily on Saturdays and Sunday I plan to see her again in the month for further follow-up She is educated to watch out for signs and symptoms of bleeding or infection She will continue Eliquis for prevention against risk of blood clot

## 2019-04-26 NOTE — Progress Notes (Signed)
Sarah Mays OFFICE PROGRESS NOTE  Patient Care Team: Gayland Curry, DO as PCP - General (Geriatric Medicine) Larey Dresser, MD as PCP - Cardiology (Cardiology) Community, [facility], Clent Demark, MD as Consulting Physician (Ophthalmology) Larey Dresser, MD as Consulting Physician (Cardiology) Heath Lark, MD as Consulting Physician (Hematology and Oncology) Clent Jacks, MD as Consulting Physician (Ophthalmology) Jerrell Belfast, MD as Consulting Physician (Otolaryngology)  ASSESSMENT & PLAN:  Polycythemia vera Her CBC shows platelets continue to trend up The patient stated she has been compliant taking medications as directed I recommend increasing the dose of hydroxyurea, from 11 capsules/week to 12 capsules/week I put in writing, for her to take hydroxyurea, 2 capsules daily from Mondays to Fridays and 1 capsule daily on Saturdays and Sunday I plan to see her again in the month for further follow-up She is educated to watch out for signs and symptoms of bleeding or infection She will continue Eliquis for prevention against risk of blood clot   No orders of the defined types were placed in this encounter.   All questions were answered. The patient knows to call the clinic with any problems, questions or concerns. The total time spent in the appointment was 15 minutes encounter with patients including review of chart and various tests results, discussions about plan of care and coordination of care plan   Heath Lark, MD 04/26/2019 2:54 PM  INTERVAL HISTORY: Please see below for problem oriented charting. She returns for further follow-up She stated she is compliant taking hydroxyurea as directed The patient denies any recent signs or symptoms of bleeding such as spontaneous epistaxis, hematuria or hematochezia.  SUMMARY OF ONCOLOGIC HISTORY: Oncology History  Polycythemia vera ([place])  08/05/2011 Pathology Results   Peripheral blood JAK2  mutation was positive with low serum erythropoietin level. Bone marrow aspirate and biopsy was not performed.   08/12/2011 -  Chemotherapy   She is started on hydroxyurea with periodic phlebotomy.     REVIEW OF SYSTEMS:   Constitutional: Denies fevers, chills or abnormal weight loss Eyes: Denies blurriness of vision Ears, nose, mouth, throat, and face: Denies mucositis or sore throat Respiratory: Denies cough, dyspnea or wheezes Cardiovascular: Denies palpitation, chest discomfort or lower extremity swelling Gastrointestinal:  Denies nausea, heartburn or change in bowel habits Skin: Denies abnormal skin rashes Lymphatics: Denies new lymphadenopathy or easy bruising Neurological:Denies numbness, tingling or new weaknesses Behavioral/Psych: Mood is stable, no new changes  All other systems were reviewed with the patient and are negative.  I have reviewed the past medical history, past surgical history, social history and family history with the patient and they are unchanged from previous note.  ALLERGIES:  is allergic to amlodipine and augmentin [amoxicillin-pot clavulanate].  MEDICATIONS:  Current Outpatient Medications  Medication Sig Dispense Refill  . apixaban (ELIQUIS) 2.5 MG TABS tablet Take 1 tablet (2.5 mg total) by mouth 2 (two) times daily. 180 tablet 3  . Cholecalciferol (VITAMIN D) 2000 units CAPS Take 2,000 Units by mouth daily.     . fentaNYL (DURAGESIC) 12 MCG/HR Place 1 patch onto the skin every 3 (three) days. 10 patch 0  . gabapentin (NEURONTIN) 100 MG capsule TAKE THREE (3) CAPSULES AT BEDTIME. 90 capsule 0  . hydrALAZINE (APRESOLINE) 50 MG tablet TAKE 1 1/2 TABLETS THREE TIMES DAILY. 135 tablet 1  . hydroxyurea (HYDREA) 500 MG capsule Take 2 capsules daily from Mondays to Fridays and 1 capsule daily on Saturdays and Sundays 100 capsule 0  .  levothyroxine (SYNTHROID) 100 MCG tablet TAKE 1 TABLET ONCE DAILY BEFORE BREAKFAST. 90 tablet 1  . Magnesium 250 MG TABS Take  250 mg by mouth daily.     . Melatonin 10 MG TABS Take 10 mg by mouth at bedtime.    . metoprolol succinate (TOPROL-XL) 25 MG 24 hr tablet Take 1 tablet (25 mg total) by mouth 2 (two) times daily. 60 tablet 6  . Multiple Vitamins-Minerals (PRESERVISION AREDS 2) CAPS Take 1 capsule by mouth 2 (two) times daily.     . polyethylene glycol powder (MIRALAX) 17 GM/SCOOP powder Take 17 g by mouth in the morning and at bedtime. For 7 days 255 g 0  . potassium chloride (KLOR-CON) 10 MEQ tablet TAKE 2 TABLETS IN THE MORNING AND 1 TABLET IN THE EVENING. 90 tablet 6  . sennosides-docusate sodium (SENOKOT-S) 8.6-50 MG tablet Take 2 tablets by mouth 2 (two) times daily.    . sodium chloride (OCEAN) 0.65 % SOLN nasal spray Place 2 sprays into both nostrils 2 (two) times daily.     [name] Kitchen torsemide (DEMADEX) 20 MG tablet Take 3 tablets (60 mg total) by mouth daily. 276 tablet 2  . traMADol (ULTRAM) 50 MG tablet TAKE ONE TABLET AT BEDTIME. 30 tablet 0   No current facility-administered medications for this visit.    PHYSICAL EXAMINATION: ECOG PERFORMANCE STATUS: 1 - Symptomatic but completely ambulatory  Vitals:   04/26/19 1117  BP: 111/71  Pulse: (!) 130  Resp: 18  Temp: 98.3 F (36.8 C)  SpO2: 95%   Filed Weights   04/26/19 1117  Weight: 105 lb (47.6 kg)    GENERAL:alert, no distress and comfortable NEURO: alert & oriented x 3 with fluent speech, no focal motor/sensory deficits  LABORATORY DATA:  I have reviewed the data as listed    Component Value Date/Time   NA 134 (L) 04/09/2019 0157   NA 136 (A) 08/10/2017 0000   NA 136 09/14/2016 1428   K 3.6 04/09/2019 0157   K 4.0 09/14/2016 1428   CL 97 (L) 04/09/2019 0157   CO2 24 04/09/2019 0129   CO2 30 (H) 09/14/2016 1428   GLUCOSE 97 04/09/2019 0157   GLUCOSE 112 09/14/2016 1428   BUN 30 (H) 04/09/2019 0157   BUN 20 08/10/2017 0000   BUN 14.7 09/14/2016 1428   CREATININE 1.50 (H) 04/09/2019 0157   CREATININE 0.8 09/14/2016 1428    CALCIUM 9.5 04/09/2019 0129   CALCIUM 11.0 (H) 09/14/2016 1428   PROT 7.2 04/09/2019 0129   PROT 7.0 09/14/2016 1428   ALBUMIN 4.9 04/09/2019 0129   ALBUMIN 4.6 09/14/2016 1428   AST 20 04/09/2019 0129   AST 17 09/14/2016 1428   ALT 16 04/09/2019 0129   ALT 18 09/14/2016 1428   ALKPHOS 86 04/09/2019 0129   ALKPHOS 62 09/14/2016 1428   BILITOT 1.1 04/09/2019 0129   BILITOT 1.00 09/14/2016 1428   GFRNONAA 38 (L) 04/09/2019 0129   GFRAA 44 (L) 04/09/2019 0129    No results found for: SPEP, UPEP  Lab Results  Component Value Date   WBC 10.4 04/26/2019   NEUTROABS 8.4 (H) 04/26/2019   HGB 12.5 04/26/2019   HCT 38.6 04/26/2019   MCV 119.1 (H) 04/26/2019   PLT 648 (H) 04/26/2019      Chemistry      Component Value Date/Time   NA 134 (L) 04/09/2019 0157   NA 136 (A) 08/10/2017 0000   NA 136 09/14/2016 1428   K 3.6 04/09/2019  0157   K 4.0 09/14/2016 1428   CL 97 (L) 04/09/2019 0157   CO2 24 04/09/2019 0129   CO2 30 (H) 09/14/2016 1428   BUN 30 (H) 04/09/2019 0157   BUN 20 08/10/2017 0000   BUN 14.7 09/14/2016 1428   CREATININE 1.50 (H) 04/09/2019 0157   CREATININE 0.8 09/14/2016 1428   GLU 76 08/10/2017 0000      Component Value Date/Time   CALCIUM 9.5 04/09/2019 0129   CALCIUM 11.0 (H) 09/14/2016 1428   ALKPHOS 86 04/09/2019 0129   ALKPHOS 62 09/14/2016 1428   AST 20 04/09/2019 0129   AST 17 09/14/2016 1428   ALT 16 04/09/2019 0129   ALT 18 09/14/2016 1428   BILITOT 1.1 04/09/2019 0129   BILITOT 1.00 09/14/2016 1428       RADIOGRAPHIC STUDIES: I have personally reviewed the radiological images as listed and agreed with the findings in the report. CT Renal Stone Study  Result Date: 04/09/2019 CLINICAL DATA:  Bilateral lower flank pain EXAM: CT ABDOMEN AND PELVIS WITHOUT CONTRAST TECHNIQUE: Multidetector CT imaging of the abdomen and pelvis was performed following the standard protocol without IV contrast. COMPARISON:  CT pelvis August 09, 2017 FINDINGS: Lower  chest: There is moderate cardiomegaly. No hiatal hernia. Streaky atelectasis seen at both lung bases. A tortuous descending aorta is noted. Hepatobiliary: Although limited due to the lack of intravenous contrast, normal in appearance without gross focal abnormality. Layering calcified gallstones/sludge is present. Pancreas:  Unremarkable.  No surrounding inflammatory changes. Spleen: Normal in size. Although limited due to the lack of intravenous contrast, normal in appearance. Adrenals/Urinary Tract: Both adrenal glands appear normal. Again seen is marked right pelvicaliectasis to the level of the UPJ the mid to distal ureter is decompressed. No renal calculi are noted. Multiple bilateral low-density lesions are seen within both kidneys. The largest within the midpole of the right kidney measuring 2 cm. The largest within the upper pole of left kidney is 4 cm. The bladder is unremarkable. Stomach/Bowel: The stomach, small bowel, and colon are normal in appearance. No inflammatory changes or obstructive findings. There is a moderate to large amount of right colonic stool present. Mildly prominent air-filled loops of transverse colon and descending colon is seen. Scattered colonic diverticula are noted. Vascular/Lymphatic: Again noted are multiple bilateral retroperitoneal coarse calcifications. There is scattered aortic atherosclerosis. Reproductive: The patient is status post hysterectomy. No adnexal masses or collections seen. Other: No evidence of abdominal wall mass or hernia. Musculoskeletal: No acute or significant osseous findings. IMPRESSION: 1. 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 No renal or collecting system calculi. 2. Moderate to large amount of right colonic stool with mildly prominent air-filled loops of transverse colon and descending colon. 3.  Cholelithiasis/layering sludge 4.  Aortic Atherosclerosis (ICD10-I70.0). 5. Moderate cardiomegaly  Electronically Signed   By: Prudencio Pair M.D.   On: 04/09/2019 02:53

## 2019-04-30 ENCOUNTER — Other Ambulatory Visit: Payer: Self-pay

## 2019-04-30 ENCOUNTER — Encounter (HOSPITAL_COMMUNITY): Payer: Self-pay

## 2019-04-30 ENCOUNTER — Ambulatory Visit (HOSPITAL_COMMUNITY)
Admission: RE | Admit: 2019-04-30 | Discharge: 2019-04-30 | Disposition: A | Discharge status: Home / Self Care | Payer: Medicare Other | Source: Ambulatory Visit | Attending: Adult Health | Admitting: Adult Health

## 2019-04-30 VITALS — BP 134/92 | HR 93 | Wt 110.0 lb

## 2019-04-30 DIAGNOSIS — Z888 Allergy status to other drugs, medicaments and biological substances status: Secondary | ICD-10-CM | POA: Insufficient documentation

## 2019-04-30 DIAGNOSIS — Z79890 Hormone replacement therapy (postmenopausal): Secondary | ICD-10-CM | POA: Insufficient documentation

## 2019-04-30 DIAGNOSIS — Z880 Allergy status to penicillin: Secondary | ICD-10-CM | POA: Diagnosis not present

## 2019-04-30 DIAGNOSIS — H3530 Unspecified macular degeneration: Secondary | ICD-10-CM | POA: Insufficient documentation

## 2019-04-30 DIAGNOSIS — Z881 Allergy status to other antibiotic agents status: Secondary | ICD-10-CM | POA: Diagnosis not present

## 2019-04-30 DIAGNOSIS — Z90710 Acquired absence of both cervix and uterus: Secondary | ICD-10-CM | POA: Insufficient documentation

## 2019-04-30 DIAGNOSIS — E039 Hypothyroidism, unspecified: Secondary | ICD-10-CM | POA: Diagnosis not present

## 2019-04-30 DIAGNOSIS — D45 Polycythemia vera: Secondary | ICD-10-CM | POA: Insufficient documentation

## 2019-04-30 DIAGNOSIS — I340 Nonrheumatic mitral (valve) insufficiency: Secondary | ICD-10-CM | POA: Insufficient documentation

## 2019-04-30 DIAGNOSIS — Z79899 Other long term (current) drug therapy: Secondary | ICD-10-CM | POA: Diagnosis not present

## 2019-04-30 DIAGNOSIS — R001 Bradycardia, unspecified: Secondary | ICD-10-CM | POA: Diagnosis not present

## 2019-04-30 DIAGNOSIS — I110 Hypertensive heart disease with heart failure: Secondary | ICD-10-CM | POA: Diagnosis not present

## 2019-04-30 DIAGNOSIS — Z8249 Family history of ischemic heart disease and other diseases of the circulatory system: Secondary | ICD-10-CM | POA: Diagnosis not present

## 2019-04-30 DIAGNOSIS — Z7901 Long term (current) use of anticoagulants: Secondary | ICD-10-CM | POA: Diagnosis not present

## 2019-04-30 DIAGNOSIS — Z87891 Personal history of nicotine dependence: Secondary | ICD-10-CM | POA: Diagnosis not present

## 2019-04-30 DIAGNOSIS — Z8543 Personal history of malignant neoplasm of ovary: Secondary | ICD-10-CM | POA: Diagnosis not present

## 2019-04-30 DIAGNOSIS — I10 Essential (primary) hypertension: Secondary | ICD-10-CM

## 2019-04-30 DIAGNOSIS — Z803 Family history of malignant neoplasm of breast: Secondary | ICD-10-CM | POA: Diagnosis not present

## 2019-04-30 DIAGNOSIS — I4892 Unspecified atrial flutter: Secondary | ICD-10-CM | POA: Diagnosis not present

## 2019-04-30 DIAGNOSIS — I5032 Chronic diastolic (congestive) heart failure: Secondary | ICD-10-CM | POA: Diagnosis not present

## 2019-04-30 DIAGNOSIS — I480 Paroxysmal atrial fibrillation: Secondary | ICD-10-CM | POA: Insufficient documentation

## 2019-04-30 DIAGNOSIS — R0600 Dyspnea, unspecified: Secondary | ICD-10-CM | POA: Diagnosis not present

## 2019-04-30 DIAGNOSIS — I482 Chronic atrial fibrillation, unspecified: Secondary | ICD-10-CM

## 2019-04-30 DIAGNOSIS — R0609 Other forms of dyspnea: Secondary | ICD-10-CM

## 2019-04-30 LAB — BASIC METABOLIC PANEL
Anion gap: 7 (ref 5–15)
BUN: 24 mg/dL — ABNORMAL HIGH (ref 8–23)
CO2: 29 mmol/L (ref 22–32)
Calcium: 9.9 mg/dL (ref 8.9–10.3)
Chloride: 101 mmol/L (ref 98–111)
Creatinine, Ser: 1.07 mg/dL — ABNORMAL HIGH (ref 0.44–1.00)
GFR calc Af Amer: 54 mL/min — ABNORMAL LOW (ref >60)
GFR calc non Af Amer: 47 mL/min — ABNORMAL LOW (ref >60)
Glucose, Bld: 99 mg/dL (ref 70–99)
Potassium: 4.6 mmol/L (ref 3.5–5.1)
Sodium: 137 mmol/L (ref 135–145)

## 2019-04-30 NOTE — Patient Instructions (Signed)
Labs done today, your results will be available in MyChart, we will contact you for abnormal readings.  Your physician recommends that you schedule a follow-up appointment in: 2-3 weeks  If you have any questions or concerns before your next appointment please send [place] a message through [place] or call our office at [phone].  At the [facility], you and your health needs are our priority. As part of our continuing mission to provide you with exceptional heart care, we have created designated Provider Care Teams. These Care Teams include your primary Cardiologist (physician) and Advanced Practice Providers (APPs- Physician Assistants and Nurse Practitioners) who all work together to provide you with the care you need, when you need it.   You may see any of the following providers on your designated Care Team at your next follow up: [name] Kitchen Dr Glori Bickers . Dr Loralie Champagne . Darrick Grinder, NP . Lyda Jester, PA . Audry Riles, PharmD   Please be sure to bring in all your medications bottles to every appointment.

## 2019-04-30 NOTE — Progress Notes (Signed)
ID:  JEENA MANU, DOB 1930-10-19, MRN [id number]   Provider location: [facility] Type of Visit: Established patient  PCP:  Gayland Curry, DO  Cardiologist:  Loralie Champagne, MD  Chief Complaint: Heart Failure    History of Present Illness: Sarah Mays is a 86 y.o. female who has a history of paroxysmal atrial fibrillation and chronic diastolic CHF.  In 7/16, she was admitted with chest pain.  Cardiolite showed no ischemia or infarction.  Echo in 6/17 showed EF 60-65%.  She developed problems elevated HR when in atrial fibrillation but bradycardia when in NSR.  She underwent atrial fibrillation ablation in 11/17.  She is tolerating Xarelto without melena or BRBPR.  Dr. Rayann Heman had her stop amiodarone.    Echo in 11/18 showed EF 60-65%, PASP 47 mmHg.   TEE-guided DCCV back to NSR in 6/19.  TEE showed EF 55-60% with moderate-severe MR.   She developed multiple episodes of epistaxis with Xarelto use.  We have transitioned her to apixaban 2.5 mg bid and she has not had epistaxis since then.   Echo in 6/20 showed EF 60-65%, normal RV, mild-moderate MR.   She had recurrent atrial fibrillation with DCCV to NSR in 6/20.  She then had atypical flutter with DCCV to NSR in 8/20. Symptomatically worse when in atrial fibrillation/flutter.  At last visit, she was in junctional rhythm. Zio patch x 3 days in 9/20 showed 51% atrial flutter with controlled rate. She saw Dr. Rayann Heman and they decided against repeat ablation.    Last visit 04/19/19 and at that time lasix was stopped and she was switched to torsemide 60 qam/40 qpm x 3 days then torsemide 60 mg daily. Amiodarone was stopped and toprol xl was started.   Today she returns for HF follow up.Overall feeling ok but remains SOB with exertion. Able to walk to get her meals with a walker.  Complaining of dry cough. Denies PND/Orthopnea. She does not weigh. No bleeding issues. Appetite ok. No fever or chills.  Taking  all medications. Lives at [facility]   Labs (7/16): K 4, creatinine 0.94, calcium 10.8, LFTs normal Labs (9/16): K 4, creatinine 0.9, LDL 69, TSH normal, HCT 42.3 Labs (5/17): K 4.6, creatinine 0.94, HCT 41.5, LFTs normal. Labs (6/17): K 5, creatinine 1.04, LDL 42, HDL 101, TSH normal Labs (12/17): K 3.7, creatinine 0.76, hgb 11.2 Labs (4/18): K 4.6, creatinine 0.91 Labs (10/18): K 4.5, creatinine 0.62 Labs (12/18): hgb 13.5 Labs (1/19): K 4.3, creatinine 0.6 Labs (4/19): hgb 13.5 Labs (7/19): K 4.5, creatinine 0.5, hgb 12.1 Labs (11/19): K 3.7, creatinine 0.95 Labs (12/19): K 3.6, creatinine 0.9, hgb 11.6 Labs (6/20): K 3.7, creatinine 1.01 => 1.17 Labs (9/20): K 4.1, creatinine 1.17 Labs (3/21): K 3.7, creatinine 1.27  PMH: 1. Atrial fibrillation/flutter: Diagnosed initially in 10/13.  Holter monitor in 11/13 showed atrial fibrillation with average rate 65.  - Atrial fibrillation ablation in 11/17.  - TEE-guided DCCV in 6/19.  - atrial fibrillation with DCCV to NSR in 6/20.  - atypical atrial flutter with DCCV to NSR in 8/20.  - Zio patch x 3 days (9/20): 51% atrial flutter 2. Chronic diastolic CHF: Echo ([id number]) with EF 65-70%, mild MR, moderate biatrial enlargement, moderate TR, PA systolic pressure 45 mmHg.   Echo (10/15): EF 60-65%.  Echo (7/16) with EF 60-65%, mild MR.  - Echo (6/17): EF 60-65%, PASP 41 mmHg.  - Echo (11/18): EF 60-65%, PASP  47 mmHg - TEE (6/19): EF 55-60%, mild LVH, mildly decreased RV systolic function, peak RV-RA gradient 40 mmHg, moderate-severe MR with bileaflet MVP, ERO 0.37 cm^2.  - Echo (6/20): EF 60-65%, normal RV, mild-moderate MR, severe biatrial enlargement.  3. ETT-Sestamibi (11/13): Exercised to stage II, small partially reversible anteroapical perfusion defect suggesting mild ischemia versus attenuation, EF 80%.  Cardiolite (7/16) with EF 64%, no ischemia or infarction.  4. HTN: Lower extremity swelling with amlodipine.  5. Polycythemia  vera: Has had phlebotomy only once.  6. Hypothyroidism 7. Macular degeneration 8. TAH 1980 9. Familial hypocalciuric hypercalcemia 10. PFTs (amiodarone use) in 1/14 showed a mild obstructive defect.  11. Degenerative disc disease 12. Sick sinus syndrome: Has not required PPM. Junctional rhythm has been noted in the past.  13. Mitral regurgitation: TEE (6/19) with moderate-severe MR with bileaflet MVP, ERO 0.37 cm^2.  - Echo (6/20) with mild-moderate MR.   Current Outpatient Medications  Medication Sig Dispense Refill  . apixaban (ELIQUIS) 2.5 MG TABS tablet Take 1 tablet (2.5 mg total) by mouth 2 (two) times daily. 180 tablet 3  . Cholecalciferol (VITAMIN D) 2000 units CAPS Take 2,000 Units by mouth daily.     . fentaNYL (DURAGESIC) 12 MCG/HR Place 1 patch onto the skin every 3 (three) days. 10 patch 0  . gabapentin (NEURONTIN) 100 MG capsule TAKE THREE (3) CAPSULES AT BEDTIME. 90 capsule 0  . hydrALAZINE (APRESOLINE) 50 MG tablet TAKE 1 1/2 TABLETS THREE TIMES DAILY. 135 tablet 1  . hydroxyurea (HYDREA) 500 MG capsule Take 2 capsules daily from Mondays to Fridays and 1 capsule daily on Saturdays and Sundays 100 capsule 0  . levothyroxine (SYNTHROID) 100 MCG tablet TAKE 1 TABLET ONCE DAILY BEFORE BREAKFAST. 90 tablet 1  . Magnesium 250 MG TABS Take 250 mg by mouth daily.     . Melatonin 10 MG TABS Take 10 mg by mouth at bedtime.    . metoprolol succinate (TOPROL-XL) 25 MG 24 hr tablet Take 1 tablet (25 mg total) by mouth 2 (two) times daily. 60 tablet 6  . Multiple Vitamins-Minerals (PRESERVISION AREDS 2) CAPS Take 1 capsule by mouth 2 (two) times daily.     . polyethylene glycol powder (MIRALAX) 17 GM/SCOOP powder Take 17 g by mouth in the morning and at bedtime. For 7 days 255 g 0  . potassium chloride (KLOR-CON) 10 MEQ tablet TAKE 2 TABLETS IN THE MORNING AND 1 TABLET IN THE EVENING. 90 tablet 6  . sennosides-docusate sodium (SENOKOT-S) 8.6-50 MG tablet Take 2 tablets by mouth 2 (two)  times daily.    . sodium chloride (OCEAN) 0.65 % SOLN nasal spray Place 2 sprays into both nostrils 2 (two) times daily.     [name] Kitchen torsemide (DEMADEX) 20 MG tablet Take 3 tablets (60 mg total) by mouth daily. 276 tablet 2  . traMADol (ULTRAM) 50 MG tablet TAKE ONE TABLET AT BEDTIME. 30 tablet 0   No current facility-administered medications for this encounter.    Allergies:   Amlodipine and Augmentin [amoxicillin-pot clavulanate]   Social History:  The patient  reports that she quit smoking about 35 years ago. Her smoking use included cigarettes. She has never used smokeless tobacco. She reports previous alcohol use of about 1.0 standard drinks of alcohol per week. She reports that she does not use drugs.   Family History:  The patient's family history includes Breast cancer in her daughter; Cancer in her daughter, sister, sister, and sister; Heart disease in her father  and sister; Hypertension in her father; Ovarian cancer (age of onset: 8) in her mother.   ROS:  Please see the history of present illness.   All other systems are personally reviewed and negative.  REDS CLIP 21% Exam:   BP (!) 134/92   Pulse 93   Wt 49.9 kg (110 lb)   SpO2 97%   BMI 21.48 kg/m  General:  Ambulated in the clinic with walker. No resp difficulty HEENT: normal Neck: supple. no JVD. Carotids 2+ bilat; no bruits. No lymphadenopathy or thryomegaly appreciated. Cor: PMI nondisplaced. Irregular rate & rhythm. No rubs, gallops or murmurs. Lungs: clear Abdomen: soft, nontender, nondistended. No hepatosplenomegaly. No bruits or masses. Good bowel sounds. Extremities: no cyanosis, clubbing, rash, trace lower extremity edema around the ankles.   Neuro: alert & orientedx3, cranial nerves grossly intact. moves all 4 extremities w/o difficulty. Affect pleasant  EKG A fib Fib 93 bpm   Recent Labs: 10/13/2018: Magnesium 2.4; TSH 3.622 01/22/2019: B Natriuretic Peptide 534.5 04/09/2019: ALT 16; BUN 30; Creatinine, Ser 1.50;  Potassium 3.6; Sodium 134 04/26/2019: Hemoglobin 12.5; Platelets 648  Personally reviewed   Wt Readings from Last 3 Encounters:  04/30/19 49.9 kg (110 lb)  04/26/19 47.6 kg (105 lb)  04/19/19 48.9 kg (107 lb 12.8 oz)    ASSESSMENT AND PLAN:  1. Atrial fibrillation/flutter: First noted in 10/13.  Atrial fibrillation has triggered acute on chronic diastolic CHF in the past.  CHADSVASC score is 50 (age, female gender, HTN, CHF).  She is anticoagulated with apixaban 2.5 mg bid which is appropriate for her age/weight.  She had atrial fibrillation ablation in 11/17.  TEE-guided DCCV in 6/19.  Cardioversion in 6/20 for atrial fibrillation and again in 8/20 for atypical atrial flutter.  Zio patch in 9/20 showed 51% atrial flutter.  Today and in 12/20, she has been in flutter.  She saw Dr. Rayann Heman who thought rate-control would probably be the best strategy, recommended against repeat ablation.   Now focusing on rate control. Rate controlled today.  - Continue Toprol XL 25 mg bid for rate control.    - Continue apixaban 2.5 mg bid.  2. Chronic diastolic CHF:  Echo was done in 6/20 with EF 60-65%, normal RV, mild-moderate MR.   -NYHA III. Reds Clip 21%. She is not volume overloaded.  - Continue torsemide 60 mg daily. May need to cut back if renal function is elevated.  3. HTN: Ok continue current regimen  4. Bradycardia: Suspect she has a degree of sick sinus syndrome. Prior junctional rhythm.  However, it looks now like she is in persistent atypical atrial flutter.  - Follow rhythm closely on Toprol XL.   5. Mitral regurgitation: Moderate to severe MR in setting of bileaflet prolapse on TEE in 6/19. However, echo in 6/20 showed only mild-moderate MR.   Discussed Reds Clip results. Check BMET today. Follow up in 2-3 weeks. If she remains SOB will need to obtain PFTs/CXR. ? Dyspnea is related to amiodarone.   Jeanmarie Hubert, NP  04/30/2019  [facility] [place] [address]  [facility] [place] [address] [phone] (office) [phone] (fax)

## 2019-04-30 NOTE — Progress Notes (Signed)
ReDS Vest / Clip - 04/30/19 1600      ReDS Vest / Clip   Station Marker  B    Ruler Value  26    ReDS Value Range  Low volume    ReDS Actual Value  21    Anatomical Comments  sitting

## 2019-05-03 ENCOUNTER — Telehealth (HOSPITAL_COMMUNITY): Payer: Self-pay | Admitting: *Deleted

## 2019-05-03 DIAGNOSIS — H0102A Squamous blepharitis right eye, upper and lower eyelids: Secondary | ICD-10-CM | POA: Diagnosis not present

## 2019-05-03 DIAGNOSIS — Z961 Presence of intraocular lens: Secondary | ICD-10-CM | POA: Diagnosis not present

## 2019-05-03 DIAGNOSIS — H353231 Exudative age-related macular degeneration, bilateral, with active choroidal neovascularization: Secondary | ICD-10-CM | POA: Diagnosis not present

## 2019-05-03 DIAGNOSIS — H0102B Squamous blepharitis left eye, upper and lower eyelids: Secondary | ICD-10-CM | POA: Diagnosis not present

## 2019-05-03 DIAGNOSIS — H16143 Punctate keratitis, bilateral: Secondary | ICD-10-CM | POA: Diagnosis not present

## 2019-05-03 DIAGNOSIS — H04123 Dry eye syndrome of bilateral lacrimal glands: Secondary | ICD-10-CM | POA: Diagnosis not present

## 2019-05-03 MED ORDER — TORSEMIDE 20 MG PO TABS: 80.0000 mg | ORAL_TABLET | Freq: Every day | ORAL | 2 refills | Status: DC

## 2019-05-03 NOTE — Telephone Encounter (Signed)
Pt called c/o swelling in feet and ankles also c/o shortness of breath. Pr Dr.McLean increase torsemide to 80mg  daily. Pt aware and agreeable with plan.

## 2019-05-17 ENCOUNTER — Other Ambulatory Visit: Payer: Self-pay

## 2019-05-17 ENCOUNTER — Ambulatory Visit (INDEPENDENT_AMBULATORY_CARE_PROVIDER_SITE_OTHER): Payer: Medicare Other | Admitting: Ophthalmology

## 2019-05-17 ENCOUNTER — Telehealth (HOSPITAL_COMMUNITY): Payer: Self-pay | Admitting: *Deleted

## 2019-05-17 ENCOUNTER — Encounter (INDEPENDENT_AMBULATORY_CARE_PROVIDER_SITE_OTHER): Payer: Self-pay | Admitting: Ophthalmology

## 2019-05-17 DIAGNOSIS — H353211 Exudative age-related macular degeneration, right eye, with active choroidal neovascularization: Secondary | ICD-10-CM

## 2019-05-17 DIAGNOSIS — H02052 Trichiasis without entropian right lower eyelid: Secondary | ICD-10-CM | POA: Insufficient documentation

## 2019-05-17 DIAGNOSIS — H4051X3 Glaucoma secondary to other eye disorders, right eye, severe stage: Secondary | ICD-10-CM | POA: Insufficient documentation

## 2019-05-17 MED ORDER — AFLIBERCEPT 2MG/0.05ML IZ SOLN FOR KALEIDOSCOPE
2.0000 mg | INTRAVITREAL | Status: AC | PRN
  Administered 2019-05-17: 17:00:00 2 mg via INTRAVITREAL

## 2019-05-17 NOTE — Patient Instructions (Signed)
The nature of wet macular degeneration was discussed with the patient.  Forms of therapy reviewed include the use of Anti-VEGF medications injected painlessly into the eye, as well as other possible treatment modalities, including thermal laser therapy. Fellow eye involvement and risks were discussed with the patient. Upon the finding of wet age related macular degeneration, treatment will be offered. The treatment regimen is on a treat as needed basis with the intent to treat if necessary and extend interval of exams when possible. On average 1 out of 6 patients do not need lifetime therapy. However, the risk of recurrent disease is high for a lifetime.  Initially monthly, then periodic, examinations and evaluations will determine whether the next treatment is required on the day of the examination.    The nature of posterior vitreous detachment was discussed with the patient as well as its physiology, its age prevalence, and its possible implication regarding retinal breaks and detachment.  An informational brochure was given to the patient.  All the patient's questions were answered.  The patient was asked to return if new or different flashes or floaters develops.   Patient was instructed to contact office immediately if any changes were noticed. I explained to the patient that vitreous inside the eye is similar to jello inside a bowl. As the jello melts it can start to pull away from the bowl, similarly the vitreous throughout our lives can begin to pull away from the retina. That process is called a posterior vitreous detachment. In some cases, the vitreous can tug hard enough on the retina to form a retinal tear. I discussed with the patient the signs and symptoms of a retinal detachment.  Do not rub the eye. 

## 2019-05-17 NOTE — Telephone Encounter (Signed)
Please ask to wear compression stockings daily. May remove at bedtime.   Rance Smithson NP-C  5:09 PM

## 2019-05-17 NOTE — Telephone Encounter (Signed)
Pt left VM stating her feet are still very swollen even though we increased torsemide to 80mg  daily.  Pt has an office visit schedule for 4/13 but wants to know if she needed to make medication changes until then.  Routed to [place] for advice

## 2019-05-17 NOTE — Progress Notes (Signed)
05/17/2019     CHIEF COMPLAINT Patient presents for Retina Follow Up   HISTORY OF PRESENT ILLNESS: Sarah Mays is a 86 y.o. female who presents to the clinic today for:   HPI    Retina Follow Up    Patient presents with  Wet AMD.  In right eye.  This started 6 weeks ago.  Severity is mild.  Duration of 6 weeks.  Since onset it is stable.          Comments    6 Week AMD F/U OD, poss Eylea OD       Last edited by Rockie Neighbours, [place] on 05/17/2019  2:48 PM. (History)      Referring physician: Gayland Curry, DO [place],  [place] [address]  HISTORICAL INFORMATION:   Selected notes from the [place]: No current outpatient medications on file. (Ophthalmic Drugs)   No current facility-administered medications for this visit. (Ophthalmic Drugs)   Current Outpatient Medications (Other)  Medication Sig  . apixaban (ELIQUIS) 2.5 MG TABS tablet Take 1 tablet (2.5 mg total) by mouth 2 (two) times daily.  . Cholecalciferol (VITAMIN D) 2000 units CAPS Take 2,000 Units by mouth daily.   . fentaNYL (DURAGESIC) 12 MCG/HR Place 1 patch onto the skin every 3 (three) days.  [name] Kitchen gabapentin (NEURONTIN) 100 MG capsule TAKE THREE (3) CAPSULES AT BEDTIME.  . hydrALAZINE (APRESOLINE) 50 MG tablet TAKE 1 1/2 TABLETS THREE TIMES DAILY.  . hydroxyurea (HYDREA) 500 MG capsule Take 2 capsules daily from Mondays to Fridays and 1 capsule daily on Saturdays and Sundays  . levothyroxine (SYNTHROID) 100 MCG tablet TAKE 1 TABLET ONCE DAILY BEFORE BREAKFAST.  . Magnesium 250 MG TABS Take 250 mg by mouth daily.   . Melatonin 10 MG TABS Take 10 mg by mouth at bedtime.  . metoprolol succinate (TOPROL-XL) 25 MG 24 hr tablet Take 1 tablet (25 mg total) by mouth 2 (two) times daily.  . Multiple Vitamins-Minerals (PRESERVISION AREDS 2) CAPS Take 1 capsule by mouth 2 (two) times daily.   . polyethylene glycol powder (MIRALAX) 17 GM/SCOOP powder Take 17 g by  mouth in the morning and at bedtime. For 7 days  . potassium chloride (KLOR-CON) 10 MEQ tablet TAKE 2 TABLETS IN THE MORNING AND 1 TABLET IN THE EVENING.  . sennosides-docusate sodium (SENOKOT-S) 8.6-50 MG tablet Take 2 tablets by mouth 2 (two) times daily.  . sodium chloride (OCEAN) 0.65 % SOLN nasal spray Place 2 sprays into both nostrils 2 (two) times daily.   [name] Kitchen torsemide (DEMADEX) 20 MG tablet Take 4 tablets (80 mg total) by mouth daily.  . traMADol (ULTRAM) 50 MG tablet TAKE ONE TABLET AT BEDTIME.   No current facility-administered medications for this visit. (Other)      REVIEW OF SYSTEMS:    ALLERGIES Allergies  Allergen Reactions  . Amlodipine Swelling and Other (See Comments)    Lower extremity swelling   . Augmentin [Amoxicillin-Pot Clavulanate] Other (See Comments)    Stomach Pain/cramps Did it involve swelling of the face/tongue/throat, SOB, or low BP? No Did it involve sudden or severe rash/hives, skin peeling, or any reaction on the inside of your mouth or nose? No Did you need to seek medical attention at a hospital or doctor's office? Unknown When did it last happen?unknown If all above answers are "NO", may proceed with cephalosporin use.     PAST MEDICAL HISTORY  Past Medical History:  Diagnosis Date  . Abnormal stress test    a. 11/2011 Ex MV: EF 80%, small, partially reversible anteroapical defect->mild ischemia vs attenuation-->med Rx.  [name] Kitchen Alopecia, unspecified   . Arthritis   . Chronic diastolic CHF (congestive heart failure) ([place])    a. 11/2013 Echo: EF 60-65%, no rwma, mild TR, PASP 60mmHg.  . Closed fracture of lower end of radius with ulna   . Deafness    Left, s/p multiple surgeries  . Essential hypertension   . Gouty arthropathy, unspecified   . History of kidney stones   . Hypothyroidism   . Insomnia, unspecified   . Macular degeneration    Left, s/p inj. tx  . Mitral valve disorders(424.0)   . Neck mass    right, w/u including MRI  negative  . PAF (paroxysmal atrial fibrillation) (H[facility])    a. on amio (02/2012 mild obstruction on PFT's) and xarelto.  . Polycythemia vera(238.4)   . Pure hypercholesterolemia   . Senile osteoporosis    Reclast in the past  . Tricuspid valve disorders, specified as nonrheumatic    Past Surgical History:  Procedure Laterality Date  . BREAST BIOPSY  06/18/1998   left  . CARDIOVERSION N/A 07/15/2017   Procedure: CARDIOVERSION;  Surgeon: Larey Dresser, MD;  Location[facility] ENDOSCOPY;  Service: Cardiovascular;  Laterality: N/A;  . CARDIOVERSION N/A 07/21/2018   Procedure: CARDIOVERSION;  Surgeon: Larey Dresser, MD;  Location[facility] ENDOSCOPY;  Service: Cardiovascular;  Laterality: N/A;  . CARDIOVERSION N/A 10/06/2018   Procedure: CARDIOVERSION;  Surgeon: Larey Dresser, MD;  Location: [facility] ENDOSCOPY;  Service: Cardiovascular;  Laterality: N/A;  . ELECTROPHYSIOLOGIC STUDY N/A 01/08/2016   Procedure: Atrial Fibrillation Ablation;  Surgeon: Thompson Grayer, MD;  Location: [place] CV LAB;  Service: Cardiovascular;  Laterality: N/A;  . FEMUR IM NAIL Right 10/08/2015   Procedure: INTRAMEDULLARY (IM) NAIL FEMORAL RIGHT;  Surgeon: Paralee Cancel, MD;  Location: [facility] ORS;  Service: Orthopedics;  Laterality: Right;  . MASS EXCISION Right 11/26/2016   Procedure: EXCISION RIGHT LATERAL NECK MASS;  Surgeon: Jerrell Belfast, MD;  Location: [place];  Service: ENT;  Laterality: Right;  . TEE WITHOUT CARDIOVERSION N/A 07/15/2017   Procedure: TRANSESOPHAGEAL ECHOCARDIOGRAM (TEE);  Surgeon: Larey Dresser, MD;  Location: [facility] ENDOSCOPY;  Service: Cardiovascular;  Laterality: N/A;  . TONSILLECTOMY    . TOTAL ABDOMINAL HYSTERECTOMY    . TYMPANOPLASTY Bilateral     FAMILY HISTORY Family History  Problem Relation Age of Onset  . Hypertension Father   . Heart disease Father        patient does not know details.   . Ovarian cancer Mother 45  . Cancer Sister        colon  . Heart disease Sister   . Cancer Sister   .  Cancer Sister        hodgkin disease  . Breast cancer Daughter   . Cancer Daughter        breast    SOCIAL HISTORY Social History   Tobacco Use  . Smoking status: Former Smoker    Types: Cigarettes    Quit date: 01/19/1984    Years since quitting: 35.3  . Smokeless tobacco: Never Used  Substance Use Topics  . Alcohol use: Not Currently    Alcohol/week: 1.0 standard drinks    Types: 1 Glasses of wine per week    Comment: 2 per day/ 14 a week  . Drug use: No  OPHTHALMIC EXAM:  Not recorded      IMAGING AND PROCEDURES  Imaging and Procedures for @TODAY @           ASSESSMENT/PLAN:  @PROBAPNOTE @  No diagnosis found.  1.  2.  3.  Ophthalmic Meds Ordered this visit:  No orders of the defined types were placed in this encounter.      No follow-ups on file.  There are no Patient Instructions on file for this visit.   Explained the diagnoses, plan, and follow up with the patient and they expressed understanding.  Patient expressed understanding of the importance of proper follow up care.   Clent Demark Zeenat Jeanbaptiste M.D. Diseases & Surgery of the Retina and Vitreous Retina & Diabetic [place] @TODAY @     Abbreviations: M myopia (nearsighted); A astigmatism; H hyperopia (farsighted); P presbyopia; Mrx spectacle prescription;  CTL contact lenses; OD right eye; OS left eye; OU both eyes  XT exotropia; ET esotropia; PEK punctate epithelial keratitis; PEE punctate epithelial erosions; DES dry eye syndrome; MGD meibomian gland dysfunction; ATs artificial tears; PFAT's preservative free artificial tears; [place] nuclear sclerotic cataract; PSC posterior subcapsular cataract; ERM epi-retinal membrane; PVD posterior vitreous detachment; RD retinal detachment; DM diabetes mellitus; DR diabetic retinopathy; NPDR non-proliferative diabetic retinopathy; PDR proliferative diabetic retinopathy; CSME clinically significant macular edema; DME diabetic macular edema; dbh dot  blot hemorrhages; CWS cotton wool spot; POAG primary open angle glaucoma; C/D cup-to-disc ratio; HVF humphrey visual field; GVF goldmann visual field; OCT optical coherence tomography; IOP intraocular pressure; BRVO Branch retinal vein occlusion; CRVO central retinal vein occlusion; CRAO central retinal artery occlusion; BRAO branch retinal artery occlusion; RT retinal tear; SB scleral buckle; PPV pars plana vitrectomy; VH Vitreous hemorrhage; PRP panretinal laser photocoagulation; IVK intravitreal kenalog; VMT vitreomacular traction; MH Macular hole;  NVD neovascularization of the disc; NVE neovascularization elsewhere; AREDS age related eye disease study; ARMD age related macular degeneration; POAG primary open angle glaucoma; EBMD epithelial/anterior basement membrane dystrophy; ACIOL anterior chamber intraocular lens; IOL intraocular lens; PCIOL posterior chamber intraocular lens; Phaco/IOL phacoemulsification with intraocular lens placement; [place] photorefractive keratectomy; LASIK laser assisted in situ keratomileusis; HTN hypertension; DM diabetes mellitus; COPD chronic obstructive pulmonary disease

## 2019-05-18 NOTE — Telephone Encounter (Signed)
Called pt no answer/left vm for pt to return my call.  

## 2019-05-22 ENCOUNTER — Encounter (HOSPITAL_COMMUNITY): Payer: Self-pay

## 2019-05-22 ENCOUNTER — Other Ambulatory Visit: Payer: Self-pay

## 2019-05-22 ENCOUNTER — Ambulatory Visit (HOSPITAL_COMMUNITY)
Admission: RE | Admit: 2019-05-22 | Discharge: 2019-05-22 | Disposition: A | Discharge status: Home / Self Care | Payer: Medicare Other | Source: Ambulatory Visit | Attending: Cardiology | Admitting: Cardiology

## 2019-05-22 VITALS — BP 142/98 | HR 89 | Wt 110.6 lb

## 2019-05-22 DIAGNOSIS — E039 Hypothyroidism, unspecified: Secondary | ICD-10-CM | POA: Diagnosis not present

## 2019-05-22 DIAGNOSIS — Z888 Allergy status to other drugs, medicaments and biological substances status: Secondary | ICD-10-CM | POA: Diagnosis not present

## 2019-05-22 DIAGNOSIS — Z87891 Personal history of nicotine dependence: Secondary | ICD-10-CM | POA: Diagnosis not present

## 2019-05-22 DIAGNOSIS — Z881 Allergy status to other antibiotic agents status: Secondary | ICD-10-CM | POA: Insufficient documentation

## 2019-05-22 DIAGNOSIS — I10 Essential (primary) hypertension: Secondary | ICD-10-CM | POA: Diagnosis not present

## 2019-05-22 DIAGNOSIS — Z880 Allergy status to penicillin: Secondary | ICD-10-CM | POA: Diagnosis not present

## 2019-05-22 DIAGNOSIS — I482 Chronic atrial fibrillation, unspecified: Secondary | ICD-10-CM | POA: Insufficient documentation

## 2019-05-22 DIAGNOSIS — I484 Atypical atrial flutter: Secondary | ICD-10-CM | POA: Insufficient documentation

## 2019-05-22 DIAGNOSIS — I5032 Chronic diastolic (congestive) heart failure: Secondary | ICD-10-CM | POA: Diagnosis not present

## 2019-05-22 DIAGNOSIS — Z79899 Other long term (current) drug therapy: Secondary | ICD-10-CM | POA: Diagnosis not present

## 2019-05-22 DIAGNOSIS — I110 Hypertensive heart disease with heart failure: Secondary | ICD-10-CM | POA: Insufficient documentation

## 2019-05-22 DIAGNOSIS — R001 Bradycardia, unspecified: Secondary | ICD-10-CM | POA: Insufficient documentation

## 2019-05-22 DIAGNOSIS — Z8249 Family history of ischemic heart disease and other diseases of the circulatory system: Secondary | ICD-10-CM | POA: Diagnosis not present

## 2019-05-22 DIAGNOSIS — I340 Nonrheumatic mitral (valve) insufficiency: Secondary | ICD-10-CM | POA: Insufficient documentation

## 2019-05-22 LAB — BASIC METABOLIC PANEL
Anion gap: 10 (ref 5–15)
BUN: 30 mg/dL — ABNORMAL HIGH (ref 8–23)
CO2: 31 mmol/L (ref 22–32)
Calcium: 10.3 mg/dL (ref 8.9–10.3)
Chloride: 96 mmol/L — ABNORMAL LOW (ref 98–111)
Creatinine, Ser: 1.25 mg/dL — ABNORMAL HIGH (ref 0.44–1.00)
GFR calc Af Amer: 45 mL/min — ABNORMAL LOW (ref >60)
GFR calc non Af Amer: 39 mL/min — ABNORMAL LOW (ref >60)
Glucose, Bld: 104 mg/dL — ABNORMAL HIGH (ref 70–99)
Potassium: 4.2 mmol/L (ref 3.5–5.1)
Sodium: 137 mmol/L (ref 135–145)

## 2019-05-22 MED ORDER — SPIRONOLACTONE 25 MG PO TABS: 12.5000 mg | ORAL_TABLET | Freq: Every day | ORAL | 3 refills | Status: DC

## 2019-05-22 NOTE — Patient Instructions (Signed)
Stop Potassium  Start Spironolactone 12.5 mg (1/2 tab) daily  Labs done today, your results will be available in MyChart, we will contact you for abnormal readings.  Labs needed in 1 week  Your physician recommends that you schedule a follow-up appointment in: 4 weeks  If you have any questions or concerns before your next appointment please send [place] a message through [place] or call our office at [phone].  At the [facility], you and your health needs are our priority. As part of our continuing mission to provide you with exceptional heart care, we have created designated Provider Care Teams. These Care Teams include your primary Cardiologist (physician) and Advanced Practice Providers (APPs- Physician Assistants and Nurse Practitioners) who all work together to provide you with the care you need, when you need it.   You may see any of the following providers on your designated Care Team at your next follow up: [name] Kitchen Dr Glori Bickers . Dr Loralie Champagne . Darrick Grinder, NP . Lyda Jester, PA . Audry Riles, PharmD   Please be sure to bring in all your medications bottles to every appointment.

## 2019-05-22 NOTE — Progress Notes (Signed)
ReDS Vest / Clip - 05/22/19 1500      ReDS Vest / Clip   Station Marker  B    Ruler Value  26    ReDS Value Range  Low volume    ReDS Actual Value  29    Anatomical Comments  sitting

## 2019-05-22 NOTE — Progress Notes (Signed)
ID:  ALANDREA SULEMAN, DOB 24-Sep-1930, MRN [id number]   Provider location: [facility] Type of Visit: Established patient  PCP:  Gayland Curry, DO  Cardiologist:  Loralie Champagne, MD  Reason for Visit: F/u for Diastolic Heart Failure    History of Present Illness: Sarah Mays is a 86 y.o. female who has a history of paroxysmal atrial fibrillation and chronic diastolic CHF.  In 7/16, she was admitted with chest pain.  Cardiolite showed no ischemia or infarction.  Echo in 6/17 showed EF 60-65%.  She developed problems elevated HR when in atrial fibrillation but bradycardia when in NSR.  She underwent atrial fibrillation ablation in 11/17.  She is tolerating Xarelto without melena or BRBPR.  Dr. Rayann Heman had her stop amiodarone.    Echo in 11/18 showed EF 60-65%, PASP 47 mmHg.   TEE-guided DCCV back to NSR in 6/19.  TEE showed EF 55-60% with moderate-severe MR.   She developed multiple episodes of epistaxis with Xarelto use.  We have transitioned her to apixaban 2.5 mg bid and she has not had epistaxis since then.   Echo in 6/20 showed EF 60-65%, normal RV, mild-moderate MR.   She had recurrent atrial fibrillation with DCCV to NSR in 6/20.  She then had atypical flutter with DCCV to NSR in 8/20. Symptomatically worse when in atrial fibrillation/flutter.  In 9/20 she was in junctional rhythm. Zio patch x 3 days in 9/20 showed 51% atrial flutter with controlled rate. She saw Dr. Rayann Heman and they decided against repeat ablation.    Recent visit 04/19/19 and at that time lasix was stopped and she was switched to torsemide 60 qam/40 qpm x 3 days then torsemide 60 mg daily. Amiodarone was stopped and toprol xl was started.   Returns for f/u. Continues to endorse bilateral ankle edema that is bothersome for her. Also reports exertional dyspnea. No resting symptoms. Wt is up 5 lb from 1 month ago, from 105 lb to 110 lb. ReDs Clip however only 29%.     Labs (7/16):  K 4, creatinine 0.94, calcium 10.8, LFTs normal Labs (9/16): K 4, creatinine 0.9, LDL 69, TSH normal, HCT 42.3 Labs (5/17): K 4.6, creatinine 0.94, HCT 41.5, LFTs normal. Labs (6/17): K 5, creatinine 1.04, LDL 42, HDL 101, TSH normal Labs (12/17): K 3.7, creatinine 0.76, hgb 11.2 Labs (4/18): K 4.6, creatinine 0.91 Labs (10/18): K 4.5, creatinine 0.62 Labs (12/18): hgb 13.5 Labs (1/19): K 4.3, creatinine 0.6 Labs (4/19): hgb 13.5 Labs (7/19): K 4.5, creatinine 0.5, hgb 12.1 Labs (11/19): K 3.7, creatinine 0.95 Labs (12/19): K 3.6, creatinine 0.9, hgb 11.6 Labs (6/20): K 3.7, creatinine 1.01 => 1.17 Labs (9/20): K 4.1, creatinine 1.17 Labs (3/21): K 3.7, creatinine 1.27  PMH: 1. Atrial fibrillation/flutter: Diagnosed initially in 10/13.  Holter monitor in 11/13 showed atrial fibrillation with average rate 65.  - Atrial fibrillation ablation in 11/17.  - TEE-guided DCCV in 6/19.  - atrial fibrillation with DCCV to NSR in 6/20.  - atypical atrial flutter with DCCV to NSR in 8/20.  - Zio patch x 3 days (9/20): 51% atrial flutter 2. Chronic diastolic CHF: Echo ([id number]) with EF 65-70%, mild MR, moderate biatrial enlargement, moderate TR, PA systolic pressure 45 mmHg.   Echo (10/15): EF 60-65%.  Echo (7/16) with EF 60-65%, mild MR.  - Echo (6/17): EF 60-65%, PASP 41 mmHg.  - Echo (11/18): EF 60-65%, PASP 47 mmHg - TEE (6/19): EF  55-60%, mild LVH, mildly decreased RV systolic function, peak RV-RA gradient 40 mmHg, moderate-severe MR with bileaflet MVP, ERO 0.37 cm^2.  - Echo (6/20): EF 60-65%, normal RV, mild-moderate MR, severe biatrial enlargement.  3. ETT-Sestamibi (11/13): Exercised to stage II, small partially reversible anteroapical perfusion defect suggesting mild ischemia versus attenuation, EF 80%.  Cardiolite (7/16) with EF 64%, no ischemia or infarction.  4. HTN: Lower extremity swelling with amlodipine.  5. Polycythemia vera: Has had phlebotomy only once.  6. Hypothyroidism 7.  Macular degeneration 8. TAH 1980 9. Familial hypocalciuric hypercalcemia 10. PFTs (amiodarone use) in 1/14 showed a mild obstructive defect.  11. Degenerative disc disease 12. Sick sinus syndrome: Has not required PPM. Junctional rhythm has been noted in the past.  13. Mitral regurgitation: TEE (6/19) with moderate-severe MR with bileaflet MVP, ERO 0.37 cm^2.  - Echo (6/20) with mild-moderate MR.   Current Outpatient Medications  Medication Sig Dispense Refill  . apixaban (ELIQUIS) 2.5 MG TABS tablet Take 1 tablet (2.5 mg total) by mouth 2 (two) times daily. 180 tablet 3  . Cholecalciferol (VITAMIN D) 2000 units CAPS Take 2,000 Units by mouth daily.     . fentaNYL (DURAGESIC) 12 MCG/HR Place 1 patch onto the skin every 3 (three) days. 10 patch 0  . gabapentin (NEURONTIN) 100 MG capsule TAKE THREE (3) CAPSULES AT BEDTIME. 90 capsule 0  . hydrALAZINE (APRESOLINE) 50 MG tablet TAKE 1 1/2 TABLETS THREE TIMES DAILY. 135 tablet 1  . hydroxyurea (HYDREA) 500 MG capsule Take 2 capsules daily from Mondays to Fridays and 1 capsule daily on Saturdays and Sundays 100 capsule 0  . levothyroxine (SYNTHROID) 100 MCG tablet TAKE 1 TABLET ONCE DAILY BEFORE BREAKFAST. 90 tablet 1  . Magnesium 250 MG TABS Take 250 mg by mouth daily.     . Melatonin 10 MG TABS Take 10 mg by mouth at bedtime.    . metoprolol succinate (TOPROL-XL) 25 MG 24 hr tablet Take 1 tablet (25 mg total) by mouth 2 (two) times daily. 60 tablet 6  . Multiple Vitamins-Minerals (PRESERVISION AREDS 2) CAPS Take 1 capsule by mouth 2 (two) times daily.     . polyethylene glycol powder (MIRALAX) 17 GM/SCOOP powder Take 17 g by mouth in the morning and at bedtime. For 7 days 255 g 0  . potassium chloride (KLOR-CON) 10 MEQ tablet TAKE 2 TABLETS IN THE MORNING AND 1 TABLET IN THE EVENING. 90 tablet 6  . sennosides-docusate sodium (SENOKOT-S) 8.6-50 MG tablet Take 2 tablets by mouth 2 (two) times daily.    . sodium chloride (OCEAN) 0.65 % SOLN nasal  spray Place 2 sprays into both nostrils 2 (two) times daily.     [name] Kitchen torsemide (DEMADEX) 20 MG tablet Take 4 tablets (80 mg total) by mouth daily. 276 tablet 2  . traMADol (ULTRAM) 50 MG tablet TAKE ONE TABLET AT BEDTIME. 30 tablet 0   No current facility-administered medications for this encounter.    Allergies:   [place] [lidocaine], Amlodipine, and Augmentin [amoxicillin-pot clavulanate]   Social History:  The patient  reports that she quit smoking about 35 years ago. Her smoking use included cigarettes. She has never used smokeless tobacco. She reports previous alcohol use of about 1.0 standard drinks of alcohol per week. She reports that she does not use drugs.   Family History:  The patient's family history includes Breast cancer in her daughter; Cancer in her daughter, sister, sister, and sister; Heart disease in her father and sister; Hypertension in  her father; Ovarian cancer (age of onset: 70) in her mother.   ROS:  Please see the history of present illness.   All other systems are personally reviewed and negative.   REDS CLIP 29%  Vital Signs:   BP (!) 142/98   Pulse 89   Wt 50.2 kg (110 lb 9.6 oz)   SpO2 97%   BMI 21.60 kg/m    PHYSICAL EXAM: General:  Well appearing, elderly WF ambulating w/ walker. No respiratory difficulty HEENT: normal Neck: supple. no JVD. Carotids 2+ bilat; no bruits. No lymphadenopathy or thyromegaly appreciated. Cor: PMI nondisplaced. Irregular rhythm, regular rate. No rubs, gallops or murmurs. Lungs: clear Abdomen: soft, nontender, nondistended. No hepatosplenomegaly. No bruits or masses. Good bowel sounds. Extremities: no cyanosis, clubbing, rash, trace bilateral LE edema Neuro: alert & oriented x 3, cranial nerves grossly intact. moves all 4 extremities w/o difficulty. Affect pleasant.  Recent Labs: 10/13/2018: Magnesium 2.4; TSH 3.622 01/22/2019: B Natriuretic Peptide 534.5 04/09/2019: ALT 16 04/26/2019: Hemoglobin 12.5; Platelets 648 04/30/2019:  BUN 24; Creatinine, Ser 1.07; Potassium 4.6; Sodium 137  Personally reviewed   Wt Readings from Last 3 Encounters:  05/22/19 50.2 kg (110 lb 9.6 oz)  04/30/19 49.9 kg (110 lb)  04/26/19 47.6 kg (105 lb)    ASSESSMENT AND PLAN:  1. Chronic Atrial fibrillation/flutter: First noted in 10/13.  Atrial fibrillation has triggered acute on chronic diastolic CHF in the past.  CHADSVASC score is 72 (age, female gender, HTN, CHF).  She is anticoagulated with apixaban 2.5 mg bid which is appropriate for her age/weight.  She had atrial fibrillation ablation in 11/17.  TEE-guided DCCV in 6/19.  Cardioversion in 6/20 for atrial fibrillation and again in 8/20 for atypical atrial flutter.  Zio patch in 9/20 showed 51% atrial flutter. She saw Dr. Rayann Heman who thought rate-control would probably be the best strategy, recommended against repeat ablation.   Now focusing on rate control stragegy. Rate controlled today in the 80s.  - Continue Toprol XL 25 mg bid for rate control.    - Continue apixaban 2.5 mg bid. Denies abnormal bleeding 2. Chronic diastolic CHF:  Echo was done in 6/20 with EF 60-65%, normal RV, mild-moderate MR.   -NYHA III. Reds Clip 29%. Trace bilateral LEE on exam, she is very preoccupied over edema  - Continue torsemide 80 mg daily.  - Start low dose spironolactone, 12.5 mg daily  - Stop KCl - Check BMP today and again in 7 days - Continue w/ compression stockings  3. HTN: BP elevated at 142/98 - Add 12.5 of spironolactone daily. Check BMP today and again in 7 days  - Continue metoprolol, torsemide and hydralazine  4. Bradycardia: Suspect she has a degree of sick sinus syndrome. Prior junctional rhythm.  However, it looks now like she is in persistent atypical atrial flutter.  - HR in the 80s on Toprol XL.  Continue current dose. No change.  5. Mitral regurgitation: Moderate to severe MR in setting of bileaflet prolapse on TEE in 6/19. However, echo in 6/20 showed only mild-moderate MR.    F/u BMP in 1 week. Return f/u w/ APP in 4 weeks.   Signed, Sarah Jester, PA-C  05/22/2019  [place] [address] [facility] [place] [address] [phone] (office) [phone] (fax)

## 2019-05-23 ENCOUNTER — Non-Acute Institutional Stay (SKILLED_NURSING_FACILITY): Payer: Medicare Other | Admitting: Internal Medicine

## 2019-05-23 ENCOUNTER — Encounter: Payer: Self-pay | Admitting: Internal Medicine

## 2019-05-23 DIAGNOSIS — Z66 Do not resuscitate: Secondary | ICD-10-CM | POA: Diagnosis not present

## 2019-05-23 DIAGNOSIS — I4891 Unspecified atrial fibrillation: Secondary | ICD-10-CM | POA: Diagnosis not present

## 2019-05-23 DIAGNOSIS — I10 Essential (primary) hypertension: Secondary | ICD-10-CM

## 2019-05-23 DIAGNOSIS — R519 Headache, unspecified: Secondary | ICD-10-CM

## 2019-05-23 DIAGNOSIS — D6869 Other thrombophilia: Secondary | ICD-10-CM | POA: Diagnosis not present

## 2019-05-23 NOTE — Progress Notes (Signed)
Provider:  Rexene Edison. Mariea Clonts, D.O., C.M.D. Location:  [facility] Room Number: 110 Place of Service:  SNF (31)  PCP: Gayland Curry, DO Patient Care Team: Gayland Curry, DO as PCP - General (Geriatric Medicine) Larey Dresser, MD as PCP - Cardiology (Cardiology) [facility], Clent Demark, MD as Consulting Physician (Ophthalmology) Larey Dresser, MD as Consulting Physician (Cardiology) Heath Lark, MD as Consulting Physician (Hematology and Oncology) Clent Jacks, MD as Consulting Physician (Ophthalmology) Jerrell Belfast, MD as Consulting Physician (Otolaryngology)  Extended Emergency Contact Information Primary Emergency Contact: [place] of [place] Phone: [phone] Mobile Phone: [phone] Relation: Daughter Secondary Emergency Contact: Carrigg,[name] Address: [address] [place], [place] [address] [place] Phone: [phone] Relation: Spouse  Code Status: DNR Goals of Care: Advanced Directive information Advanced Directives 05/23/2019  Does Patient Have a Medical Advance Directive? Yes  Type of Advance Directive Out of facility DNR (pink MOST or yellow form);Healthcare Power of Attorney  Does patient want to make changes to medical advance directive? No - Patient declined  Copy of Katie in Chart? Yes - validated most recent copy scanned in chart (See row information)  Would patient like information on creating a medical advance directive? -  Pre-existing out of facility DNR order (yellow form or pink MOST form) Pink MOST/Yellow Form most recent copy in chart - Physician notified to receive inpatient order   Chief Complaint  Patient presents with  . New Admit To SNF    afib with RVR this am    HPI: Patient is a 86 y.o. female seen today for admission to [facility] rehab for observation  due to headache, rapid heart rate, hypertension this am.  Nurse had gone to her home early am and at that time, she declined coming over the snf, but she continued to feel bad and when Nira Conn visited her again and called me with her vitals, she was agreeable to coming over for observation.  She had just begun her aldactone and potassium this morning, but had not yet taken her routine morning meds including her toprol and eliquis.  I had asked for an EKG to be done and for her to get her morning pills.  Tylenol was given for her headache.    Per nursing notes:   Res requested that I come over to check her b/p. Stated that her head was killing her" X 2 measurements, one 138/100 and 140/96 LA. Res had just seen a PA today that she stated adjusted some of her meds but she was waiting for them to come from the px. Res took 2 regular strength tyl and stated she was going to "call it a night" I informed her to also make the room as dark as possible to mitigate the h/a. Shanon Brow)  Urgent call from apartment. Headache is horrible. Please come check BP. BP 150/100 (without AM blood pressure meds), T=99.3, P=66-105 (Afib), Resp=labored 28, O2=91% on room air. Expiratory wheezing. Resident saw cardiologist yesterday and prescribed new spironolactone. Sent security to receive it from [facility]. Resident took hydralazine at 5:00 am, and tylenol at 4:45 am. Called Dr. Mariea Clonts. Resident admitted to rehab for a few days. Writer went to transport resident to Healthcare via wheelchair. Security delivered a prescription to resident as we were getting ready  to leave her apartment. Resident stated, "I need to take that medication, which my cardiology PA prescribed yesterday". Writer assisted resident in taking 1/2 tablet Spironolactone 12.5 mg, as directed. Upon reviewing resident's paperwork from cardiology, Dr. Claris Gladden office, and he wanted her to stop taking her Potassium, which she had already taken this  morning. Made copies of resident's visit to cardiology office, and provided to Dr. Mariea Clonts. Dr. Cyndi Lennert orders on arrival at 10:00 AM to Room 110: Admit to Rehab room for Observation; EKG to assess atrial fibrillation; Regular diet; Meds as taken at home; activity as tolerated. EKG completed and results shown to Dr. Mariea Clonts, who stated resident's heartrate had improved, but she wanted to see resident before making a decision about admitting her or sending her back home today. Resident's vital signs are stable. She reported her headache had improved some. Able to be up to bathroom and in room, independently, with her walker. Writer made resident aware of Dr. Cyndi Lennert feedback, and is willing to wait until Dr. Mariea Clonts can see her. Nira Conn) Dr. Mariea Clonts saw res this afternoon. Res stated she feels much better, except her light headache never go away. Res wants to go home. New orders: Tylenol one tablet now for res headache; res may have dinner time meds, have dinner here, then D/C home to [place]. given res one tab Tylenol. ([place])  When I saw Mrs. Lenis, her vitals were all improved in the afternoon, but she was beginning to get another headache so the tylenol was given as above.  Discussed importance of taking the am meds at a consistent time including the metoprolol to prevent the heart rate from escalating.  She was interested in going back to her apt rather than staying in snf.    Past Medical History:  Diagnosis Date  . Abnormal stress test    a. 11/2011 Ex MV: EF 80%, small, partially reversible anteroapical defect->mild ischemia vs attenuation-->med Rx.  [name] Kitchen Alopecia, unspecified   . Arthritis   . Chronic diastolic CHF (congestive heart failure) ([facility])    a. 11/2013 Echo: EF 60-65%, no rwma, mild TR, PASP 2mmHg.  . Closed fracture of lower end of radius with ulna   . Deafness    Left, s/p multiple surgeries  . Essential hypertension   . Gouty arthropathy, unspecified   . History of kidney stones   .  Hypothyroidism   . Insomnia, unspecified   . Macular degeneration    Left, s/p inj. tx  . Mitral valve disorders(424.0)   . Neck mass    right, w/u including MRI negative  . PAF (paroxysmal atrial fibrillation) (HCC)    a. on amio (02/2012 mild obstruction on PFT's) and xarelto.  . Polycythemia vera(238.4)   . Pure hypercholesterolemia   . Senile osteoporosis    Reclast in the past  . Tricuspid valve disorders, specified as nonrheumatic    Past Surgical History:  Procedure Laterality Date  . BREAST BIOPSY  06/18/1998   left  . CARDIOVERSION N/A 07/15/2017   Procedure: CARDIOVERSION;  Surgeon: Larey Dresser, MD;  Location: [facility] ENDOSCOPY;  Service: Cardiovascular;  Laterality: N/A;  . CARDIOVERSION N/A 07/21/2018   Procedure: CARDIOVERSION;  Surgeon: Larey Dresser, MD;  Location: [facility] ENDOSCOPY;  Service: Cardiovascular;  Laterality: N/A;  . CARDIOVERSION N/A 10/06/2018   Procedure: CARDIOVERSION;  Surgeon: Larey Dresser, MD;  Location: [facility] ENDOSCOPY;  Service: Cardiovascular;  Laterality: N/A;  . ELECTROPHYSIOLOGIC STUDY N/A 01/08/2016   Procedure: Atrial Fibrillation Ablation;  Surgeon: Thompson Grayer,  MD;  Location: [facility] CV LAB;  Service: Cardiovascular;  Laterality: N/A;  . FEMUR IM NAIL Right 10/08/2015   Procedure: INTRAMEDULLARY (IM) NAIL FEMORAL RIGHT;  Surgeon: Paralee Cancel, MD;  Location: [facility] ORS;  Service: Orthopedics;  Laterality: Right;  . MASS EXCISION Right 11/26/2016   Procedure: EXCISION RIGHT LATERAL NECK MASS;  Surgeon: Jerrell Belfast, MD;  Location: [facility];  Service: ENT;  Laterality: Right;  . TEE WITHOUT CARDIOVERSION N/A 07/15/2017   Procedure: TRANSESOPHAGEAL ECHOCARDIOGRAM (TEE);  Surgeon: Larey Dresser, MD;  Location: [facility] ENDOSCOPY;  Service: Cardiovascular;  Laterality: N/A;  . TONSILLECTOMY    . TOTAL ABDOMINAL HYSTERECTOMY    . TYMPANOPLASTY Bilateral     Social History   Socioeconomic History  . Marital status: Married    Spouse name: [name]  .  Number of children: 2  . Years of education: 25  . Highest education level: Not on file  Occupational History  . Not on file  Tobacco Use  . Smoking status: Former Smoker    Types: Cigarettes    Quit date: 01/19/1984    Years since quitting: 35.3  . Smokeless tobacco: Never Used  Substance and Sexual Activity  . Alcohol use: Not Currently    Alcohol/week: 1.0 standard drinks    Types: 1 Glasses of wine per week    Comment: 2 per day/ 14 a week  . Drug use: No  . Sexual activity: Not Currently  Other Topics Concern  . Not on file  Social History Narrative   Patient is Married 1955. 2 kids. 14 grandkid-67 years old in 2016. College graduate [name] [place]).    Lives in apartment,  Independent Living  section at [facility] since 02/2013.      Stay at home mother.       No Smoking history, Mod. alcohol use.   Patient has a living will, POA      Hobbies: CPU and painting         Social Determinants of Health   Financial Resource Strain:   . Difficulty of Paying Living Expenses:   Food Insecurity:   . Worried About Charity fundraiser in the Last Year:   . Arboriculturist in the Last Year:   Transportation Needs:   . Film/video editor (Medical):   [name] Kitchen Lack of Transportation (Non-Medical):   Physical Activity:   . Days of Exercise per Week:   . Minutes of Exercise per Session:   Stress:   . Feeling of Stress :   Social Connections:   . Frequency of Communication with Friends and Family:   . Frequency of Social Gatherings with Friends and Family:   . Attends Religious Services:   . Active Member of Clubs or Organizations:   . Attends Archivist Meetings:   [name] Kitchen Marital Status:     reports that she quit smoking about 35 years ago. Her smoking use included cigarettes. She has never used smokeless tobacco. She reports previous alcohol use of about 1.0 standard drinks of alcohol per week. She reports that she does not use  drugs.  Functional Status Survey:    Family History  Problem Relation Age of Onset  . Hypertension Father   . Heart disease Father        patient does not know details.   . Ovarian cancer Mother 15  . Cancer Sister        colon  . Heart disease Sister   .  Cancer Sister   . Cancer Sister        hodgkin disease  . Breast cancer Daughter   . Cancer Daughter        breast    Health Maintenance  Topic Date Due  . COVID-19 Vaccine (1) Never done  . INFLUENZA VACCINE  09/09/2019  . TETANUS/TDAP  01/13/2027  . DEXA SCAN  Completed  . PNA vac Low Risk Adult  Completed    Allergies  Allergen Reactions  . Akten [Lidocaine]   . Amlodipine Swelling and Other (See Comments)    Lower extremity swelling   . Augmentin [Amoxicillin-Pot Clavulanate] Other (See Comments)    Stomach Pain/cramps Did it involve swelling of the face/tongue/throat, SOB, or low BP? No Did it involve sudden or severe rash/hives, skin peeling, or any reaction on the inside of your mouth or nose? No Did you need to seek medical attention at a hospital or doctor's office? Unknown When did it last happen?unknown If all above answers are "NO", may proceed with cephalosporin use.     Outpatient Encounter Medications as of 05/23/2019  Medication Sig  . acetaminophen (TYLENOL) 325 MG tablet Take 650 mg by mouth every 4 (four) hours as needed.  [name] Kitchen apixaban (ELIQUIS) 2.5 MG TABS tablet Take 1 tablet (2.5 mg total) by mouth 2 (two) times daily.  . furosemide (LASIX) 40 MG tablet Take 40 mg by mouth 2 (two) times daily. Take 1 1/2 tablet (60mg ) PO 2 times a daily.  [name] Kitchen gabapentin (NEURONTIN) 100 MG capsule TAKE THREE (3) CAPSULES AT BEDTIME.  [name] Kitchen levothyroxine (SYNTHROID) 100 MCG tablet TAKE 1 TABLET ONCE DAILY BEFORE BREAKFAST.  . Magnesium 250 MG TABS Take 250 mg by mouth daily.   . Melatonin 10 MG TABS Take 10 mg by mouth at bedtime.  . metoprolol succinate (TOPROL-XL) 25 MG 24 hr tablet Take 1 tablet (25 mg  total) by mouth 2 (two) times daily.  . Multiple Vitamins-Minerals (PRESERVISION AREDS 2) CAPS Take 1 capsule by mouth 2 (two) times daily.   . polyethylene glycol powder (MIRALAX) 17 GM/SCOOP powder Take 17 g by mouth in the morning and at bedtime. For 7 days  . potassium chloride (KLOR-CON) 10 MEQ tablet Take 10 mEq by mouth daily.  . sennosides-docusate sodium (SENOKOT-S) 8.6-50 MG tablet Take 2 tablets by mouth 2 (two) times daily.  . sodium chloride (OCEAN) 0.65 % SOLN nasal spray Place 2 sprays into both nostrils 2 (two) times daily.   . [DISCONTINUED] Cholecalciferol (VITAMIN D) 2000 units CAPS Take 2,000 Units by mouth daily.   . [DISCONTINUED] hydrALAZINE (APRESOLINE) 50 MG tablet TAKE 1 1/2 TABLETS THREE TIMES DAILY.  . [DISCONTINUED] hydroxyurea (HYDREA) 500 MG capsule Take 2 capsules daily from Mondays to Fridays and 1 capsule daily on Saturdays and Sundays  . [DISCONTINUED] traMADol (ULTRAM) 50 MG tablet TAKE ONE TABLET AT BEDTIME.  . [DISCONTINUED] fentaNYL (DURAGESIC) 12 MCG/HR Place 1 patch onto the skin every 3 (three) days.  . [DISCONTINUED] spironolactone (ALDACTONE) 25 MG tablet Take 0.5 tablets (12.5 mg total) by mouth daily.  . [DISCONTINUED] torsemide (DEMADEX) 20 MG tablet Take 4 tablets (80 mg total) by mouth daily.   No facility-administered encounter medications on file as of 05/23/2019.    Review of Systems  Constitutional: Negative for chills, fever and malaise/fatigue.  HENT: Positive for hearing loss. Negative for sinus pain and sore throat.   Eyes: Negative for blurred vision.  Respiratory: Negative for cough and shortness of breath.  Cardiovascular: Negative for chest pain, palpitations and leg swelling.  Gastrointestinal: Negative for abdominal pain.  Genitourinary: Negative for dysuria.  Musculoskeletal: Negative for falls.  Neurological: Positive for headaches. Negative for dizziness, tingling, sensory change, speech change, focal weakness and loss of  consciousness.  Endo/Heme/Allergies: Bruises/bleeds easily.  Psychiatric/Behavioral: Negative for depression and memory loss. The patient is nervous/anxious.     Vitals:   05/23/19 1525  BP: 127/71  Pulse: 80  Temp: (!) 97.2 F (36.2 C)  SpO2: 92%  Weight: 102 lb 12.8 oz (46.6 kg)  Height: 5' (1.524 m)   Body mass index is 20.08 kg/m. Physical Exam Vitals reviewed.  Constitutional:      General: She is not in acute distress.    Appearance: Normal appearance. She is not ill-appearing or toxic-appearing.  HENT:     Head: Normocephalic and atraumatic.  Cardiovascular:     Rate and Rhythm: Rhythm irregular.  Pulmonary:     Effort: Pulmonary effort is normal.     Breath sounds: Normal breath sounds. No rales.  Musculoskeletal:        General: Normal range of motion.     Right lower leg: No edema.     Left lower leg: No edema.  Skin:    General: Skin is warm and dry.     Capillary Refill: Capillary refill takes less than 2 seconds.  Neurological:     General: No focal deficit present.     Mental Status: She is alert and oriented to person, place, and time.     Cranial Nerves: No cranial nerve deficit.     Sensory: No sensory deficit.     Motor: No weakness.     Coordination: Coordination normal.  Psychiatric:        Mood and Affect: Mood normal.     Comments: A bit anxious     Labs reviewed: Basic Metabolic Panel: Recent Labs    10/13/18 1144 01/22/19 1430 04/30/19 1619 05/22/19 1546 05/29/19 1433  NA 137   < > 137 137 138  K 4.1   < > 4.6 4.2 3.9  CL 101   < > 101 96* 96*  CO2 27   < > 29 31 33*  GLUCOSE 79   < > 99 104* 103*  BUN 24*   < > 24* 30* 40*  CREATININE 1.17*   < > 1.07* 1.25* 1.14*  CALCIUM 10.1   < > 9.9 10.3 10.8*  MG 2.4  --   --   --   --    < > = values in this interval not displayed.   Liver Function Tests: Recent Labs    10/13/18 1144 04/09/19 0129  AST 15 20  ALT 17 16  ALKPHOS 44 86  BILITOT 0.6 1.1  PROT 6.4* 7.2  ALBUMIN  4.2 4.9   No results for input(s): LIPASE, AMYLASE in the last 8760 hours. No results for input(s): AMMONIA in the last 8760 hours. CBC: Recent Labs    12/22/18 1036 12/22/18 1036 04/09/19 0129 04/09/19 0129 04/09/19 0157 04/26/19 1032 05/25/19 1118  WBC 9.8   < > 10.6*  --   --  10.4 8.9  NEUTROABS 7.8*  --   --   --   --  8.4* 6.9  HGB 12.3   < > 15.0   < > 15.6* 12.5 11.5*  HCT 37.4   < > 44.0   < > 46.0 38.6 34.5*  MCV 117.6*   < > 113.4*  --   --  119.1* 115.4*  PLT 423*   < > 534*  --   --  648* 450*   < > = values in this interval not displayed.   Cardiac Enzymes: No results for input(s): CKTOTAL, CKMB, CKMBINDEX, TROPONINI in the last 8760 hours. BNP: Invalid input(s): POCBNP No results found for: HGBA1C Lab Results  Component Value Date   TSH 3.622 10/13/2018   Lab Results  Component Value Date   VITAMINB12 364 04/14/2018   No results found for: FOLATE Lab Results  Component Value Date   IRON 98 04/14/2018   TIBC 291 04/14/2018   FERRITIN 39 04/14/2018    Imaging and Procedures obtained prior to SNF admission: EKG revealed afib with RVR but rate had already improved after she'd had her am meds  Assessment/Plan 1. Atrial fibrillation with rapid ventricular response (HCC) -I could not determine for certain if this occurred due to not having her usual morning pills as early (had only taken her new meds--aldactone and potassium) -rapid rate resolved after am meds were into her system and bp improved, headache got better -cont toprol xl bid  2. Hypercoagulable state due to atrial fibrillation (HCC) -cont eliquis 2.5mg  po bid  3. Nonintractable episodic headache, unspecified headache type -suspect due to bp at the time, but recurred later when bp normal so may have been related to sinus pressure -she had no focal neuro deficits on my exam -tylenol administered and resident did feel comfortable returning to her apt -to call me if headaches persist  4.  Essential hypertension -bp elevated this am in context of afib with RVR but improved with resolution of rapid rate -to continue bp checks with clinic nurse   5. DNR (do not resuscitate) - Do not attempt resuscitation (DNR) order again reentered into epic after removal during an ED or cardioversion visit  Family/ staff Communication: discussed with clinic nurse, snf nurse and resident  Labs/tests ordered: no new Keep all cardiology f/u visits as planned  She has had both of her covid vaccines  Robbi Spells L. Cassidi Modesitt, D.O. [facility] [employer] [address], [place] [address] Cell Phone (Mon-Fri 8am-5pm):  [phone] On Call:  [phone] & follow prompts after 5pm & weekends Office Phone:  [phone] Office Fax:  [phone]

## 2019-05-24 ENCOUNTER — Other Ambulatory Visit: Payer: Self-pay | Admitting: Hematology and Oncology

## 2019-05-24 ENCOUNTER — Other Ambulatory Visit: Payer: Self-pay | Admitting: *Deleted

## 2019-05-24 DIAGNOSIS — M545 Low back pain, unspecified: Secondary | ICD-10-CM

## 2019-05-24 MED ORDER — FENTANYL 12 MCG/HR TD PT72: 1.0000 | MEDICATED_PATCH | TRANSDERMAL | 0 refills | Status: DC

## 2019-05-24 NOTE — Telephone Encounter (Signed)
Patient is calling the office requesting a refill on her Fentanyl Patch. Patient was seen yesterday as a new admit to SNF at [facility]. Medication was discontinued at that time but patient is requesting a refill. Please Advise.

## 2019-05-24 NOTE — Telephone Encounter (Signed)
That's always a problem in the epic system.  The one you find is correct.

## 2019-05-24 NOTE — Telephone Encounter (Signed)
Its weird, when you click on the medication in Epic stating Fentanyl 12.46mcg it came up in the medication list as 67mcg.   Pended and sent to Dr. Mariea Clonts for approval.

## 2019-05-24 NOTE — Telephone Encounter (Signed)
Fentanyl should not have been discontinued when she was admitted.  Please add back and I'll sign off on it.

## 2019-05-24 NOTE — Telephone Encounter (Signed)
I am not getting the Fentanyl 12 mcg/hr patch to come up in medications that patient was currently taking. Only getting the Fentanyl 12.5 mcg/hr patch. Please Advise.   [facility] Database Confirmed Fentanyl 12 Mcg/hr patch LR: 04/25/2019

## 2019-05-25 ENCOUNTER — Encounter (HOSPITAL_COMMUNITY): Payer: Self-pay

## 2019-05-25 ENCOUNTER — Telehealth (HOSPITAL_COMMUNITY): Payer: Self-pay | Admitting: *Deleted

## 2019-05-25 ENCOUNTER — Other Ambulatory Visit: Payer: Self-pay | Admitting: Internal Medicine

## 2019-05-25 ENCOUNTER — Other Ambulatory Visit: Payer: Self-pay

## 2019-05-25 ENCOUNTER — Inpatient Hospital Stay: Payer: Medicare Other | Attending: Hematology and Oncology | Admitting: Hematology and Oncology

## 2019-05-25 ENCOUNTER — Encounter: Payer: Self-pay | Admitting: Hematology and Oncology

## 2019-05-25 ENCOUNTER — Inpatient Hospital Stay: Payer: Medicare Other

## 2019-05-25 DIAGNOSIS — D45 Polycythemia vera: Secondary | ICD-10-CM | POA: Insufficient documentation

## 2019-05-25 DIAGNOSIS — Z79899 Other long term (current) drug therapy: Secondary | ICD-10-CM | POA: Diagnosis not present

## 2019-05-25 DIAGNOSIS — I10 Essential (primary) hypertension: Secondary | ICD-10-CM | POA: Diagnosis not present

## 2019-05-25 DIAGNOSIS — D649 Anemia, unspecified: Secondary | ICD-10-CM | POA: Diagnosis not present

## 2019-05-25 DIAGNOSIS — R040 Epistaxis: Secondary | ICD-10-CM | POA: Diagnosis not present

## 2019-05-25 DIAGNOSIS — Z7901 Long term (current) use of anticoagulants: Secondary | ICD-10-CM | POA: Diagnosis not present

## 2019-05-25 DIAGNOSIS — D630 Anemia in neoplastic disease: Secondary | ICD-10-CM | POA: Diagnosis not present

## 2019-05-25 DIAGNOSIS — M545 Low back pain, unspecified: Secondary | ICD-10-CM

## 2019-05-25 LAB — CBC WITH DIFFERENTIAL/PLATELET
Abs Immature Granulocytes: 0.02 10*3/uL (ref 0.00–0.07)
Basophils Absolute: 0.1 10*3/uL (ref 0.0–0.1)
Basophils Relative: 1 %
Eosinophils Absolute: 0.1 10*3/uL (ref 0.0–0.5)
Eosinophils Relative: 1 %
HCT: 34.5 % — ABNORMAL LOW (ref 36.0–46.0)
Hemoglobin: 11.5 g/dL — ABNORMAL LOW (ref 12.0–15.0)
Immature Granulocytes: 0 %
Lymphocytes Relative: 11 %
Lymphs Abs: 1 10*3/uL (ref 0.7–4.0)
MCH: 38.5 pg — ABNORMAL HIGH (ref 26.0–34.0)
MCHC: 33.3 g/dL (ref 30.0–36.0)
MCV: 115.4 fL — ABNORMAL HIGH (ref 80.0–100.0)
Monocytes Absolute: 0.8 10*3/uL (ref 0.1–1.0)
Monocytes Relative: 9 %
Neutro Abs: 6.9 10*3/uL (ref 1.7–7.7)
Neutrophils Relative %: 78 %
Platelets: 450 10*3/uL — ABNORMAL HIGH (ref 150–400)
RBC: 2.99 MIL/uL — ABNORMAL LOW (ref 3.87–5.11)
RDW: 17.2 % — ABNORMAL HIGH (ref 11.5–15.5)
WBC: 8.9 10*3/uL (ref 4.0–10.5)
nRBC: 0 % (ref 0.0–0.2)

## 2019-05-25 NOTE — Assessment & Plan Note (Signed)
With recent dose adjustment, her platelet count has improved but she is somewhat a little bit anemic The patient stated she has been compliant taking medications as directed For now, I do not plan to adjust the dose of hydroxyurea, and she will continue taking 2 capsules daily from Mondays to Fridays and 1 capsule daily on Saturdays and Sunday I plan to see her again in the month for further follow-up She is educated to watch out for signs and symptoms of bleeding or infection

## 2019-05-25 NOTE — Assessment & Plan Note (Signed)
She is concerned about her low blood pressure recently I recommend she consult with her cardiologist and primary care doctor for medication adjustment

## 2019-05-25 NOTE — Assessment & Plan Note (Signed)
She has recurrent nosebleeds recently She is taking chronic anticoagulation therapy Her bleeding has stopped today I recommend ENT consult for further management She needs to also touch base with her cardiologist to see if her blood thinner can be stopped

## 2019-05-25 NOTE — Assessment & Plan Note (Signed)
The cause of her anemia is multifactorial, likely secondary to recent bleeding and side effects of hydroxyurea She is not symptomatic Observe for now

## 2019-05-25 NOTE — Telephone Encounter (Signed)
Left message to return call,mychart message sent with directions

## 2019-05-25 NOTE — Telephone Encounter (Signed)
Pt left VM at 2:51pm stating she has had two major nose bleeds this week and was advised by oncologist to contact our office regarding any changes to eliquis.    Routed to [place] for advice CC'd Philicia Branch,CMA as I will be out of the office the rest of the afternoon

## 2019-05-25 NOTE — Progress Notes (Signed)
[facility] OFFICE PROGRESS NOTE  Patient Care Team: Gayland Curry, DO as PCP - General (Geriatric Medicine) Larey Dresser, MD as PCP - Cardiology (Cardiology) [facility], Clent Demark, MD as Consulting Physician (Ophthalmology) Larey Dresser, MD as Consulting Physician (Cardiology) Heath Lark, MD as Consulting Physician (Hematology and Oncology) Clent Jacks, MD as Consulting Physician (Ophthalmology) Jerrell Belfast, MD as Consulting Physician (Otolaryngology)  ASSESSMENT & PLAN:  Polycythemia vera With recent dose adjustment, her platelet count has improved but she is somewhat a little bit anemic The patient stated she has been compliant taking medications as directed For now, I do not plan to adjust the dose of hydroxyurea, and she will continue taking 2 capsules daily from Mondays to Fridays and 1 capsule daily on Saturdays and Sunday I plan to see her again in the month for further follow-up She is educated to watch out for signs and symptoms of bleeding or infection  Anemia in neoplastic disease The cause of her anemia is multifactorial, likely secondary to recent bleeding and side effects of hydroxyurea She is not symptomatic Observe for now  HTN (hypertension) She is concerned about her low blood pressure recently I recommend she consult with her cardiologist and primary care doctor for medication adjustment  Epistaxis, recurrent She has recurrent nosebleeds recently She is taking chronic anticoagulation therapy Her bleeding has stopped today I recommend ENT consult for further management She needs to also touch base with her cardiologist to see if her blood thinner can be stopped   No orders of the defined types were placed in this encounter.   All questions were answered. The patient knows to call the clinic with any problems, questions or concerns. The total time spent in the appointment was 15 minutes encounter  with patients including review of chart and various tests results, discussions about plan of care and coordination of care plan   Heath Lark, MD 05/25/2019 1:16 PM  INTERVAL HISTORY: Please see below for problem oriented charting. She returns with her caregiver for further follow-up She had one episode of nosebleed this morning but it has resolved This is her second episode of nosebleed She is also concerned about recent findings of borderline low blood pressure Currently, she is not symptomatic She is compliant taking hydroxyurea as directed  SUMMARY OF ONCOLOGIC HISTORY: Oncology History  Polycythemia vera ([place])  08/05/2011 Pathology Results   Peripheral blood JAK2 mutation was positive with low serum erythropoietin level. Bone marrow aspirate and biopsy was not performed.   08/12/2011 -  Chemotherapy   She is started on hydroxyurea with periodic phlebotomy.     REVIEW OF SYSTEMS:   Constitutional: Denies fevers, chills or abnormal weight loss Eyes: Denies blurriness of vision Ears, nose, mouth, throat, and face: Denies mucositis or sore throat Respiratory: Denies cough, dyspnea or wheezes Cardiovascular: Denies palpitation, chest discomfort or lower extremity swelling Gastrointestinal:  Denies nausea, heartburn or change in bowel habits Skin: Denies abnormal skin rashes Lymphatics: Denies new lymphadenopathy or easy bruising Neurological:Denies numbness, tingling or new weaknesses Behavioral/Psych: Mood is stable, no new changes  All other systems were reviewed with the patient and are negative.  I have reviewed the past medical history, past surgical history, social history and family history with the patient and they are unchanged from previous note.  ALLERGIES:  is allergic to akten [lidocaine]; amlodipine; and augmentin [amoxicillin-pot clavulanate].  MEDICATIONS:  Current Outpatient Medications  Medication Sig Dispense Refill  . acetaminophen (TYLENOL) 325  MG tablet  Take 650 mg by mouth every 4 (four) hours as needed.    [name] Kitchen apixaban (ELIQUIS) 2.5 MG TABS tablet Take 1 tablet (2.5 mg total) by mouth 2 (two) times daily. 180 tablet 3  . fentaNYL (DURAGESIC) 12 MCG/HR Place 1 patch onto the skin every 3 (three) days. 10 patch 0  . furosemide (LASIX) 40 MG tablet Take 40 mg by mouth 2 (two) times daily. Take 1 1/2 tablet (60mg ) PO 2 times a daily.    [name] Kitchen gabapentin (NEURONTIN) 100 MG capsule TAKE THREE (3) CAPSULES AT BEDTIME. 90 capsule 0  . hydrALAZINE (APRESOLINE) 50 MG tablet TAKE 1 1/2 TABLETS THREE TIMES DAILY. 135 tablet 1  . hydroxyurea (HYDREA) 500 MG capsule TAKE 1 CAPSULE ON MONDAY, WEDNESDAY,AND FRIDAY, AND 2 CAPSULES THE OTHER DAYS OF THE WEEK. 100 capsule 0  . levothyroxine (SYNTHROID) 100 MCG tablet TAKE 1 TABLET ONCE DAILY BEFORE BREAKFAST. 90 tablet 1  . Magnesium 250 MG TABS Take 250 mg by mouth daily.     . Melatonin 10 MG TABS Take 10 mg by mouth at bedtime.    . metoprolol succinate (TOPROL-XL) 25 MG 24 hr tablet Take 1 tablet (25 mg total) by mouth 2 (two) times daily. 60 tablet 6  . Multiple Vitamins-Minerals (PRESERVISION AREDS 2) CAPS Take 1 capsule by mouth 2 (two) times daily.     . polyethylene glycol powder (MIRALAX) 17 GM/SCOOP powder Take 17 g by mouth in the morning and at bedtime. For 7 days 255 g 0  . potassium chloride (KLOR-CON) 10 MEQ tablet Take 10 mEq by mouth daily.    . sennosides-docusate sodium (SENOKOT-S) 8.6-50 MG tablet Take 2 tablets by mouth 2 (two) times daily.    . sodium chloride (OCEAN) 0.65 % SOLN nasal spray Place 2 sprays into both nostrils 2 (two) times daily.     . traMADol (ULTRAM) 50 MG tablet TAKE ONE TABLET AT BEDTIME. 30 tablet 0   No current facility-administered medications for this visit.    PHYSICAL EXAMINATION: ECOG PERFORMANCE STATUS: 1 - Symptomatic but completely ambulatory  Vitals:   05/25/19 1151  BP: 119/82  Pulse: 68  Resp: 18  Temp: 98.7 F (37.1 C)  SpO2: 95%   Filed Weights    05/25/19 1151  Weight: 109 lb 12.8 oz (49.8 kg)    GENERAL:alert, no distress and comfortable NEURO: alert & oriented x 3 with fluent speech, no focal motor/sensory deficits  LABORATORY DATA:  I have reviewed the data as listed    Component Value Date/Time   NA 137 05/22/2019 1546   NA 136 (A) 08/10/2017 0000   NA 136 09/14/2016 1428   K 4.2 05/22/2019 1546   K 4.0 09/14/2016 1428   CL 96 (L) 05/22/2019 1546   CO2 31 05/22/2019 1546   CO2 30 (H) 09/14/2016 1428   GLUCOSE 104 (H) 05/22/2019 1546   GLUCOSE 112 09/14/2016 1428   BUN 30 (H) 05/22/2019 1546   BUN 20 08/10/2017 0000   BUN 14.7 09/14/2016 1428   CREATININE 1.25 (H) 05/22/2019 1546   CREATININE 0.8 09/14/2016 1428   CALCIUM 10.3 05/22/2019 1546   CALCIUM 11.0 (H) 09/14/2016 1428   PROT 7.2 04/09/2019 0129   PROT 7.0 09/14/2016 1428   ALBUMIN 4.9 04/09/2019 0129   ALBUMIN 4.6 09/14/2016 1428   AST 20 04/09/2019 0129   AST 17 09/14/2016 1428   ALT 16 04/09/2019 0129   ALT 18 09/14/2016 1428   ALKPHOS 86 04/09/2019  0129   ALKPHOS 62 09/14/2016 1428   BILITOT 1.1 04/09/2019 0129   BILITOT 1.00 09/14/2016 1428   GFRNONAA 39 (L) 05/22/2019 1546   GFRAA 45 (L) 05/22/2019 1546    No results found for: SPEP, UPEP  Lab Results  Component Value Date   WBC 8.9 05/25/2019   NEUTROABS 6.9 05/25/2019   HGB 11.5 (L) 05/25/2019   HCT 34.5 (L) 05/25/2019   MCV 115.4 (H) 05/25/2019   PLT 450 (H) 05/25/2019      Chemistry      Component Value Date/Time   NA 137 05/22/2019 1546   NA 136 (A) 08/10/2017 0000   NA 136 09/14/2016 1428   K 4.2 05/22/2019 1546   K 4.0 09/14/2016 1428   CL 96 (L) 05/22/2019 1546   CO2 31 05/22/2019 1546   CO2 30 (H) 09/14/2016 1428   BUN 30 (H) 05/22/2019 1546   BUN 20 08/10/2017 0000   BUN 14.7 09/14/2016 1428   CREATININE 1.25 (H) 05/22/2019 1546   CREATININE 0.8 09/14/2016 1428   GLU 76 08/10/2017 0000      Component Value Date/Time   CALCIUM 10.3 05/22/2019 1546   CALCIUM  11.0 (H) 09/14/2016 1428   ALKPHOS 86 04/09/2019 0129   ALKPHOS 62 09/14/2016 1428   AST 20 04/09/2019 0129   AST 17 09/14/2016 1428   ALT 16 04/09/2019 0129   ALT 18 09/14/2016 1428   BILITOT 1.1 04/09/2019 0129   BILITOT 1.00 09/14/2016 1428       RADIOGRAPHIC STUDIES: I have personally reviewed the radiological images as listed and agreed with the findings in the report. Intravitreal Injection, Pharmacologic Agent - OD - Right Eye  Result Date: 05/17/2019 Time Out 05/17/2019. 4:43 PM. Confirmed correct patient, procedure, site, and patient consented. Anesthesia Topical anesthesia was used. Anesthetic medications included Lidocaine 4%. Procedure Preparation included Tobramycin 0.3%, Ofloxacin . A 30 gauge needle was used. Injection: 2 mg aflibercept Alfonse Flavors) SOLN   NDC: [id number], Lot: [id number]   Route: Intravitreal, Site: Right Eye, Waste: 0 mg Post-op Post injection exam found visual acuity of at least counting fingers. The patient tolerated the procedure well. There were no complications. The patient received written and verbal post procedure care education. Post injection medications were not given.   OCT, Retina - OU - Both Eyes  Result Date: 05/17/2019 Right Eye Quality was good. Scan locations included subfoveal. Progression has improved. Findings include abnormal foveal contour, inner retinal atrophy, outer retinal atrophy, central retinal atrophy. Left Eye Quality was good. Scan locations included subfoveal. Findings include outer retinal tubulation, no IRF, no SRF, outer retinal atrophy, central retinal atrophy, inner retinal atrophy. Notes OD, much less perifoveal cystoid macular edema on Eylea.  We will repeat today.  Current interval is 6 weeks.  We will extend this to 7 weeks

## 2019-05-25 NOTE — Telephone Encounter (Signed)
Probably no Eliquis changes but needs CBC. Use saline nasal spray twice a day to keep nasal mucosa moist.

## 2019-05-28 NOTE — Telephone Encounter (Signed)
Pt aware and agreeable with plan. CBC was drawn Friday at her oncologist please see last labs from 05/25/2019.

## 2019-05-28 NOTE — Telephone Encounter (Signed)
Spoke with [facility] and they received the rx

## 2019-05-28 NOTE — Telephone Encounter (Signed)
[facility] calling stating that pt need a refill, I advised refill was sent 05/24/2019, per [place] they never received the rx, I advised we have a confirmed receipt. Per [facility] can this be sent again?

## 2019-05-28 NOTE — Telephone Encounter (Signed)
Not sure what's going on with this Rx.  I think I've sent it three times now.  The insurance is not wanting to cover the lowest dose which makes no sense to me.  It looks like it did go through from what I can tell though.

## 2019-05-28 NOTE — Addendum Note (Signed)
Addended by: Despina Hidden on: 05/28/2019 09:03 AM   Modules accepted: Orders

## 2019-05-29 ENCOUNTER — Other Ambulatory Visit (HOSPITAL_COMMUNITY): Payer: Self-pay

## 2019-05-29 ENCOUNTER — Other Ambulatory Visit: Payer: Self-pay

## 2019-05-29 ENCOUNTER — Ambulatory Visit (HOSPITAL_COMMUNITY)
Admission: RE | Admit: 2019-05-29 | Discharge: 2019-05-29 | Disposition: A | Discharge status: Home / Self Care | Payer: Medicare Other | Source: Ambulatory Visit | Attending: Cardiology | Admitting: Cardiology

## 2019-05-29 ENCOUNTER — Other Ambulatory Visit: Payer: Self-pay | Admitting: Internal Medicine

## 2019-05-29 DIAGNOSIS — I5032 Chronic diastolic (congestive) heart failure: Secondary | ICD-10-CM

## 2019-05-29 DIAGNOSIS — M545 Low back pain, unspecified: Secondary | ICD-10-CM

## 2019-05-29 LAB — BASIC METABOLIC PANEL
Anion gap: 9 (ref 5–15)
BUN: 40 mg/dL — ABNORMAL HIGH (ref 8–23)
CO2: 33 mmol/L — ABNORMAL HIGH (ref 22–32)
Calcium: 10.8 mg/dL — ABNORMAL HIGH (ref 8.9–10.3)
Chloride: 96 mmol/L — ABNORMAL LOW (ref 98–111)
Creatinine, Ser: 1.14 mg/dL — ABNORMAL HIGH (ref 0.44–1.00)
GFR calc Af Amer: 50 mL/min — ABNORMAL LOW (ref >60)
GFR calc non Af Amer: 44 mL/min — ABNORMAL LOW (ref >60)
Glucose, Bld: 103 mg/dL — ABNORMAL HIGH (ref 70–99)
Potassium: 3.9 mmol/L (ref 3.5–5.1)
Sodium: 138 mmol/L (ref 135–145)

## 2019-05-30 ENCOUNTER — Telehealth: Payer: Self-pay | Admitting: Hematology and Oncology

## 2019-05-30 NOTE — Telephone Encounter (Signed)
Scheduled per scheduled message, patient has been called and notified.

## 2019-05-31 ENCOUNTER — Telehealth (HOSPITAL_COMMUNITY): Payer: Self-pay | Admitting: *Deleted

## 2019-05-31 NOTE — Telephone Encounter (Signed)
Patient called in with issues with nosebleeds. She is able to get them to clot off but they just "pour" when it happens. She is using saline nasal spray frequently. Recommended calling ENT as she last saw one over a year ago for further assessment today. Pt reluctantly agreed to call ENT.

## 2019-06-01 ENCOUNTER — Other Ambulatory Visit (HOSPITAL_COMMUNITY): Payer: Self-pay | Admitting: Cardiology

## 2019-06-01 ENCOUNTER — Telehealth: Payer: Self-pay | Admitting: Internal Medicine

## 2019-06-01 ENCOUNTER — Encounter (HOSPITAL_COMMUNITY): Payer: Self-pay

## 2019-06-01 NOTE — Telephone Encounter (Signed)
Dr.McLean sent message to patient via mychart.

## 2019-06-01 NOTE — Telephone Encounter (Signed)
Patient's nurse called in and states that the patient had a nose bleed for 20-25 mins. She administered aspirin and nasal spray. About 30-45 mins later she was called back because the patient had another nose bleed that lasted 15 mins. She wanted to make Dr. Rayann Heman and his nurse aware, please advise.

## 2019-06-01 NOTE — Telephone Encounter (Signed)
Hold Eliquis for 3 days and followup with ENT please.

## 2019-06-01 NOTE — Telephone Encounter (Signed)
Mychart message received this morning about nose bleed and was routed to [place] for advice

## 2019-06-03 ENCOUNTER — Encounter: Payer: Self-pay | Admitting: Internal Medicine

## 2019-06-03 ENCOUNTER — Telehealth: Payer: Self-pay | Admitting: Nurse Practitioner

## 2019-06-03 NOTE — Telephone Encounter (Signed)
   Patient called with question about possibly resuming Eliquis.  She was told to hold it for Saturday, Sunday, and Monday.  She sees ENT tomorrow (Monday).  She did have another nosebleed this morning.  Last dose was in the evening on Friday, April 23.  I advised that as previously directed, she should continue to hold Eliquis until cleared by ENT.  Caller verbalized understanding and was grateful for the call back.  Murray Hodgkins, NP 06/03/2019, 10:56 AM

## 2019-06-04 DIAGNOSIS — J310 Chronic rhinitis: Secondary | ICD-10-CM | POA: Diagnosis not present

## 2019-06-04 DIAGNOSIS — R040 Epistaxis: Secondary | ICD-10-CM | POA: Diagnosis not present

## 2019-06-06 ENCOUNTER — Other Ambulatory Visit: Payer: Self-pay | Admitting: Internal Medicine

## 2019-06-06 MED ORDER — HYDROXYUREA 500 MG PO CAPS: ORAL_CAPSULE | ORAL | 0 refills | Status: DC

## 2019-06-12 ENCOUNTER — Telehealth (HOSPITAL_COMMUNITY): Payer: Self-pay | Admitting: *Deleted

## 2019-06-12 NOTE — Telephone Encounter (Signed)
Needs followup in office with PA/NP to assess

## 2019-06-12 NOTE — Telephone Encounter (Signed)
Pt has pending appt with app clinic 5/11.does she need to be seen sooner?

## 2019-06-12 NOTE — Telephone Encounter (Signed)
OK to keep 5/11 appt

## 2019-06-12 NOTE — Telephone Encounter (Signed)
Pt called stating her ankles were swollen and she had another nose bleed. Pt advised to contact ENT about nose bleed as they recently saw her for her nose bleeds and were treating her with nasal spray etc. I told pt I would follow up with Dr.McLean about the swelling in her ankles. Pt said she is wearing compression hose and keeping her feet elevated as much as possible.    Routed to [place] for advice on swelling

## 2019-06-19 ENCOUNTER — Ambulatory Visit (HOSPITAL_COMMUNITY)
Admission: RE | Admit: 2019-06-19 | Discharge: 2019-06-19 | Disposition: A | Discharge status: Home / Self Care | Payer: Medicare Other | Source: Ambulatory Visit | Attending: Cardiology | Admitting: Cardiology

## 2019-06-19 ENCOUNTER — Encounter (HOSPITAL_COMMUNITY): Payer: Self-pay

## 2019-06-19 ENCOUNTER — Other Ambulatory Visit: Payer: Self-pay

## 2019-06-19 VITALS — BP 102/88 | HR 65 | Wt 106.0 lb

## 2019-06-19 DIAGNOSIS — F17210 Nicotine dependence, cigarettes, uncomplicated: Secondary | ICD-10-CM | POA: Insufficient documentation

## 2019-06-19 DIAGNOSIS — I484 Atypical atrial flutter: Secondary | ICD-10-CM | POA: Diagnosis not present

## 2019-06-19 DIAGNOSIS — E039 Hypothyroidism, unspecified: Secondary | ICD-10-CM | POA: Diagnosis not present

## 2019-06-19 DIAGNOSIS — Z79899 Other long term (current) drug therapy: Secondary | ICD-10-CM | POA: Insufficient documentation

## 2019-06-19 DIAGNOSIS — I482 Chronic atrial fibrillation, unspecified: Secondary | ICD-10-CM | POA: Diagnosis not present

## 2019-06-19 DIAGNOSIS — R001 Bradycardia, unspecified: Secondary | ICD-10-CM | POA: Diagnosis not present

## 2019-06-19 DIAGNOSIS — Z7901 Long term (current) use of anticoagulants: Secondary | ICD-10-CM | POA: Insufficient documentation

## 2019-06-19 DIAGNOSIS — Z8249 Family history of ischemic heart disease and other diseases of the circulatory system: Secondary | ICD-10-CM | POA: Diagnosis not present

## 2019-06-19 DIAGNOSIS — I340 Nonrheumatic mitral (valve) insufficiency: Secondary | ICD-10-CM | POA: Diagnosis not present

## 2019-06-19 DIAGNOSIS — I110 Hypertensive heart disease with heart failure: Secondary | ICD-10-CM | POA: Insufficient documentation

## 2019-06-19 DIAGNOSIS — I5033 Acute on chronic diastolic (congestive) heart failure: Secondary | ICD-10-CM | POA: Insufficient documentation

## 2019-06-19 DIAGNOSIS — I5032 Chronic diastolic (congestive) heart failure: Secondary | ICD-10-CM

## 2019-06-19 LAB — CBC
HCT: 37.4 % (ref 36.0–46.0)
Hemoglobin: 11.8 g/dL — ABNORMAL LOW (ref 12.0–15.0)
MCH: 38.6 pg — ABNORMAL HIGH (ref 26.0–34.0)
MCHC: 31.6 g/dL (ref 30.0–36.0)
MCV: 122.2 fL — ABNORMAL HIGH (ref 80.0–100.0)
Platelets: 480 10*3/uL — ABNORMAL HIGH (ref 150–400)
RBC: 3.06 MIL/uL — ABNORMAL LOW (ref 3.87–5.11)
RDW: 16.2 % — ABNORMAL HIGH (ref 11.5–15.5)
WBC: 7.2 10*3/uL (ref 4.0–10.5)
nRBC: 0 % (ref 0.0–0.2)

## 2019-06-19 LAB — COMPREHENSIVE METABOLIC PANEL
ALT: 19 U/L (ref 0–44)
AST: 17 U/L (ref 15–41)
Albumin: 4.2 g/dL (ref 3.5–5.0)
Alkaline Phosphatase: 48 U/L (ref 38–126)
Anion gap: 11 (ref 5–15)
BUN: 50 mg/dL — ABNORMAL HIGH (ref 8–23)
CO2: 31 mmol/L (ref 22–32)
Calcium: 10.9 mg/dL — ABNORMAL HIGH (ref 8.9–10.3)
Chloride: 98 mmol/L (ref 98–111)
Creatinine, Ser: 1.41 mg/dL — ABNORMAL HIGH (ref 0.44–1.00)
GFR calc Af Amer: 39 mL/min — ABNORMAL LOW (ref >60)
GFR calc non Af Amer: 34 mL/min — ABNORMAL LOW (ref >60)
Glucose, Bld: 89 mg/dL (ref 70–99)
Potassium: 3.8 mmol/L (ref 3.5–5.1)
Sodium: 140 mmol/L (ref 135–145)
Total Bilirubin: 1.1 mg/dL (ref 0.3–1.2)
Total Protein: 6.6 g/dL (ref 6.5–8.1)

## 2019-06-19 LAB — BRAIN NATRIURETIC PEPTIDE: B Natriuretic Peptide: 549 pg/mL — ABNORMAL HIGH (ref 0.0–100.0)

## 2019-06-19 NOTE — Patient Instructions (Signed)
Routine lab work today. Will notify you of abnormal results  Follow up in 3-4 months.  

## 2019-06-19 NOTE — Progress Notes (Signed)
ID:  Sarah Mays, DOB August 24, 1930, MRN [id number]   Provider location: [facility] Type of Visit: Established patient  PCP:  Gayland Curry, DO  Cardiologist:  Loralie Champagne, MD  Reason for Visit: F/u for Diastolic Heart Failure, complains of subjective LEE and nose bleeds     History of Present Illness: Sarah Mays is a 86 y.o. female who has a history of paroxysmal atrial fibrillation and chronic diastolic CHF.  In 7/16, she was admitted with chest pain.  Cardiolite showed no ischemia or infarction.  Echo in 6/17 showed EF 60-65%.  She developed problems elevated HR when in atrial fibrillation but bradycardia when in NSR.  She underwent atrial fibrillation ablation in 11/17.  She is tolerating Xarelto without melena or BRBPR.  Dr. Rayann Heman had her stop amiodarone.    Echo in 11/18 showed EF 60-65%, PASP 47 mmHg.   TEE-guided DCCV back to NSR in 6/19.  TEE showed EF 55-60% with moderate-severe MR.   She developed multiple episodes of epistaxis with Xarelto use.  We have transitioned her to apixaban 2.5 mg bid and she has not had epistaxis since then.   Echo in 6/20 showed EF 60-65%, normal RV, mild-moderate MR.   She had recurrent atrial fibrillation with DCCV to NSR in 6/20.  She then had atypical flutter with DCCV to NSR in 8/20. Symptomatically worse when in atrial fibrillation/flutter.  In 9/20 she was in junctional rhythm. Zio patch x 3 days in 9/20 showed 51% atrial flutter with controlled rate. She saw Dr. Rayann Heman and they decided against repeat ablation.    Recent visit 04/19/19 and at that time lasix was stopped and she was switched to torsemide 60 qam/40 qpm x 3 days then torsemide 60 mg daily. Amiodarone was stopped and toprol xl was started.   Had return f/u in 4/21 and endorsed bilateral ankle edema that was bothersome for her (minimal on my exam_ Also reported exertional dyspnea. No resting symptoms. Her wt was up 5 lb from her previous  visit, however ReDs Clip was only 29%. I added low dose spironolactone 12.5 mg daily and encouraged elevation and use of compression stockings. BMP was followed and SCr and K remained stable.   She returns to clinic today f/u. She continues to complain of frequent bilateral LEE around her ankles. No edema present today but she has severe varicose veins bilaterally so suspect venous insuffiencey playing a role. She otherwise looks stable/ euvolemic on exam.  She also has had several minor nose bleeds since her last visit and is followed by ENT. She has an appt with them tomorrow. She has been using more nasal spray to keep nasal mucosa moist and has not had any recurrent nose bleeds in over a week. She remains on Eliquis for Afib.     Labs (7/16): K 4, creatinine 0.94, calcium 10.8, LFTs normal Labs (9/16): K 4, creatinine 0.9, LDL 69, TSH normal, HCT 42.3 Labs (5/17): K 4.6, creatinine 0.94, HCT 41.5, LFTs normal. Labs (6/17): K 5, creatinine 1.04, LDL 42, HDL 101, TSH normal Labs (12/17): K 3.7, creatinine 0.76, hgb 11.2 Labs (4/18): K 4.6, creatinine 0.91 Labs (10/18): K 4.5, creatinine 0.62 Labs (12/18): hgb 13.5 Labs (1/19): K 4.3, creatinine 0.6 Labs (4/19): hgb 13.5 Labs (7/19): K 4.5, creatinine 0.5, hgb 12.1 Labs (11/19): K 3.7, creatinine 0.95 Labs (12/19): K 3.6, creatinine 0.9, hgb 11.6 Labs (6/20): K 3.7, creatinine 1.01 => 1.17 Labs (9/20):  K 4.1, creatinine 1.17 Labs (3/21): K 3.7, creatinine 1.27 Labs (4/21): K 3.9, creatine 1.14   PMH: 1. Atrial fibrillation/flutter: Diagnosed initially in 10/13.  Holter monitor in 11/13 showed atrial fibrillation with average rate 65.  - Atrial fibrillation ablation in 11/17.  - TEE-guided DCCV in 6/19.  - atrial fibrillation with DCCV to NSR in 6/20.  - atypical atrial flutter with DCCV to NSR in 8/20.  - Zio patch x 3 days (9/20): 51% atrial flutter 2. Chronic diastolic CHF: Echo ([id number]) with EF 65-70%, mild MR, moderate biatrial  enlargement, moderate TR, PA systolic pressure 45 mmHg.   Echo (10/15): EF 60-65%.  Echo (7/16) with EF 60-65%, mild MR.  - Echo (6/17): EF 60-65%, PASP 41 mmHg.  - Echo (11/18): EF 60-65%, PASP 47 mmHg - TEE (6/19): EF 55-60%, mild LVH, mildly decreased RV systolic function, peak RV-RA gradient 40 mmHg, moderate-severe MR with bileaflet MVP, ERO 0.37 cm^2.  - Echo (6/20): EF 60-65%, normal RV, mild-moderate MR, severe biatrial enlargement.  3. ETT-Sestamibi (11/13): Exercised to stage II, small partially reversible anteroapical perfusion defect suggesting mild ischemia versus attenuation, EF 80%.  Cardiolite (7/16) with EF 64%, no ischemia or infarction.  4. HTN: Lower extremity swelling with amlodipine.  5. Polycythemia vera: Has had phlebotomy only once.  6. Hypothyroidism 7. Macular degeneration 8. TAH 1980 9. Familial hypocalciuric hypercalcemia 10. PFTs (amiodarone use) in 1/14 showed a mild obstructive defect.  11. Degenerative disc disease 12. Sick sinus syndrome: Has not required PPM. Junctional rhythm has been noted in the past.  13. Mitral regurgitation: TEE (6/19) with moderate-severe MR with bileaflet MVP, ERO 0.37 cm^2.  - Echo (6/20) with mild-moderate MR.   Current Outpatient Medications  Medication Sig Dispense Refill  . acetaminophen (TYLENOL) 325 MG tablet Take 650 mg by mouth every 4 (four) hours as needed.    [name] Kitchen apixaban (ELIQUIS) 2.5 MG TABS tablet Take 1 tablet (2.5 mg total) by mouth 2 (two) times daily. 180 tablet 3  . fentaNYL (DURAGESIC) 12 MCG/HR PLACE 1 PATCH ONTO THE SKIN EVERY 3 DAYS. REMOVE OLD PATCH. 10 patch 0  . furosemide (LASIX) 40 MG tablet Take 40 mg by mouth 2 (two) times daily. Take 1 1/2 tablet (60mg ) PO 2 times a daily.    [name] Kitchen gabapentin (NEURONTIN) 100 MG capsule TAKE THREE (3) CAPSULES AT BEDTIME. 90 capsule 0  . hydrALAZINE (APRESOLINE) 50 MG tablet TAKE 1 1/2 TABLETS THREE TIMES DAILY. 135 tablet 0  . hydroxyurea (HYDREA) 500 MG capsule TAKE 2  CAPSULEs ON Monday thru FRIDAY, AND 1 capsule sat and sun 100 capsule 0  . levothyroxine (SYNTHROID) 100 MCG tablet TAKE 1 TABLET ONCE DAILY BEFORE BREAKFAST. 90 tablet 1  . Magnesium 250 MG TABS Take 250 mg by mouth daily.     . Melatonin 10 MG TABS Take 10 mg by mouth at bedtime.    . metoprolol succinate (TOPROL-XL) 25 MG 24 hr tablet Take 1 tablet (25 mg total) by mouth 2 (two) times daily. 60 tablet 6  . Multiple Vitamins-Minerals (PRESERVISION AREDS 2) CAPS Take 1 capsule by mouth 2 (two) times daily.     . polyethylene glycol powder (MIRALAX) 17 GM/SCOOP powder Take 17 g by mouth in the morning and at bedtime. For 7 days 255 g 0  . potassium chloride (KLOR-CON) 10 MEQ tablet Take 10 mEq by mouth daily.    . sennosides-docusate sodium (SENOKOT-S) 8.6-50 MG tablet Take 2 tablets by mouth 2 (two) times daily.    [name] Kitchen  sodium chloride (OCEAN) 0.65 % SOLN nasal spray Place 2 sprays into both nostrils 2 (two) times daily.     . traMADol (ULTRAM) 50 MG tablet TAKE ONE TABLET AT BEDTIME. 30 tablet 0   No current facility-administered medications for this encounter.    Allergies:   Akten [lidocaine], Amlodipine, and Augmentin [amoxicillin-pot clavulanate]   Social History:  The patient  reports that she quit smoking about 35 years ago. Her smoking use included cigarettes. She has never used smokeless tobacco. She reports previous alcohol use of about 1.0 standard drinks of alcohol per week. She reports that she does not use drugs.   Family History:  The patient's family history includes Breast cancer in her daughter; Cancer in her daughter, sister, sister, and sister; Heart disease in her father and sister; Hypertension in her father; Ovarian cancer (age of onset: 77) in her mother.   ROS:  Please see the history of present illness.   All other systems are personally reviewed and negative.    Vital Signs:   BP 102/88   Pulse 65   Wt 48.1 kg (106 lb)   SpO2 95%   BMI 20.70 kg/m    PHYSICAL  EXAM: General:  Well appearing, thin elderly WF. No respiratory difficulty HEENT: normal Neck: supple. no JVD. Carotids 2+ bilat; no bruits. No lymphadenopathy or thyromegaly appreciated. Cor: PMI nondisplaced. Irregular rhythm, regular rate. No rubs, gallops or murmurs. Lungs: clear Abdomen: soft, nontender, nondistended. No hepatosplenomegaly. No bruits or masses. Good bowel sounds. Extremities: no cyanosis, clubbing, rash, no edema, bilateral varicose veins  Neuro: alert & oriented x 3, cranial nerves grossly intact. moves all 4 extremities w/o difficulty. Affect pleasant.  Recent Labs: 10/13/2018: Magnesium 2.4; TSH 3.622 01/22/2019: B Natriuretic Peptide 534.5 04/09/2019: ALT 16 05/25/2019: Hemoglobin 11.5; Platelets 450 05/29/2019: BUN 40; Creatinine, Ser 1.14; Potassium 3.9; Sodium 138  Personally reviewed   Wt Readings from Last 3 Encounters:  06/19/19 48.1 kg (106 lb)  05/25/19 49.8 kg (109 lb 12.8 oz)  05/23/19 46.6 kg (102 lb 12.8 oz)    ASSESSMENT AND PLAN:  1. Chronic Atrial fibrillation/flutter: First noted in 10/13.  Atrial fibrillation has triggered acute on chronic diastolic CHF in the past.  CHADSVASC score is 72 (age, female gender, HTN, CHF).  She is anticoagulated with apixaban 2.5 mg bid which is appropriate for her age/weight.  She had atrial fibrillation ablation in 11/17.  TEE-guided DCCV in 6/19.  Cardioversion in 6/20 for atrial fibrillation and again in 8/20 for atypical atrial flutter.  Zio patch in 9/20 showed 51% atrial flutter. She saw Dr. Rayann Heman who thought rate-control would probably be the best strategy, recommended against repeat ablation.   Now focusing on rate control stragegy. Rate controlled today in the 60s.  - Continue Toprol XL 25 mg bid for rate control.    - Continue apixaban 2.5 mg bid. Has had several nose bleeds, but becoming less frequent. Now followed closely by ENT. Will check CBC today 2. Chronic diastolic CHF:  Echo was done in 6/20 with EF  60-65%, normal RV, mild-moderate MR.   -NYHA II- III but euvolemic on exam  - Continue torsemide 80 mg daily.  - Continue low dose spironolactone, 12.5 mg daily  - repeat BMP today  - she complains of subjective LEE but no edema on exam today. Continue w/ compression stockings and elevate legs, when able, to help w/ edema (suspect chronic venous insuffiencey playing a role) 3. HTN: controlled on current regimen, denies  dizziness  - Continue Spironolactone, metoprolol, torsemide and hydralazine  - Check BMP today  4. Bradycardia: Suspect she has a degree of sick sinus syndrome. Prior junctional rhythm.  However, it looks now like she is in persistent atypical atrial flutter.  - HR in the 60s on Toprol XL.  Continue current dose. No change.  5. Mitral regurgitation: Moderate to severe MR in setting of bileaflet prolapse on TEE in 6/19. However, echo in 6/20 showed only mild-moderate MR.   F/u w/ Dr. Aundra Dubin in 3-4 months, or sooner if needed.   Signed, Lyda Jester, PA-C  06/19/2019  [facility] [address] [facility] [place] [address] [phone] (office) [phone] (fax)

## 2019-06-20 DIAGNOSIS — R040 Epistaxis: Secondary | ICD-10-CM | POA: Diagnosis not present

## 2019-06-20 DIAGNOSIS — J310 Chronic rhinitis: Secondary | ICD-10-CM | POA: Diagnosis not present

## 2019-06-22 ENCOUNTER — Telehealth: Payer: Self-pay | Admitting: Hematology and Oncology

## 2019-06-22 ENCOUNTER — Other Ambulatory Visit: Payer: Self-pay

## 2019-06-22 ENCOUNTER — Inpatient Hospital Stay (HOSPITAL_BASED_OUTPATIENT_CLINIC_OR_DEPARTMENT_OTHER): Payer: Medicare Other | Admitting: Hematology and Oncology

## 2019-06-22 ENCOUNTER — Inpatient Hospital Stay: Payer: Medicare Other | Attending: Hematology and Oncology

## 2019-06-22 ENCOUNTER — Encounter: Payer: Self-pay | Admitting: Hematology and Oncology

## 2019-06-22 DIAGNOSIS — D45 Polycythemia vera: Secondary | ICD-10-CM

## 2019-06-22 LAB — CBC WITH DIFFERENTIAL/PLATELET
Abs Immature Granulocytes: 0.03 10*3/uL (ref 0.00–0.07)
Basophils Absolute: 0.1 10*3/uL (ref 0.0–0.1)
Basophils Relative: 1 %
Eosinophils Absolute: 0.1 10*3/uL (ref 0.0–0.5)
Eosinophils Relative: 1 %
HCT: 37.2 % (ref 36.0–46.0)
Hemoglobin: 12.1 g/dL (ref 12.0–15.0)
Immature Granulocytes: 0 %
Lymphocytes Relative: 15 %
Lymphs Abs: 1.1 10*3/uL (ref 0.7–4.0)
MCH: 38.5 pg — ABNORMAL HIGH (ref 26.0–34.0)
MCHC: 32.5 g/dL (ref 30.0–36.0)
MCV: 118.5 fL — ABNORMAL HIGH (ref 80.0–100.0)
Monocytes Absolute: 0.8 10*3/uL (ref 0.1–1.0)
Monocytes Relative: 10 %
Neutro Abs: 5.4 10*3/uL (ref 1.7–7.7)
Neutrophils Relative %: 73 %
Platelets: 435 10*3/uL — ABNORMAL HIGH (ref 150–400)
RBC: 3.14 MIL/uL — ABNORMAL LOW (ref 3.87–5.11)
RDW: 16 % — ABNORMAL HIGH (ref 11.5–15.5)
WBC: 7.4 10*3/uL (ref 4.0–10.5)
nRBC: 0 % (ref 0.0–0.2)

## 2019-06-22 NOTE — Telephone Encounter (Signed)
Scheduled appt per 5/14 sch message - pt aware of appt date and time   

## 2019-06-22 NOTE — Assessment & Plan Note (Signed)
With recent dose adjustment, her platelet count has improved The patient stated she has been compliant taking medications as directed For now, I do not plan to adjust the dose of hydroxyurea, and she will continue taking 2 capsules daily from Mondays to Fridays and 1 capsule daily on Saturdays and Sunday I plan to see her again in 3 months for further follow-up

## 2019-06-22 NOTE — Progress Notes (Signed)
[place] OFFICE PROGRESS NOTE  Patient Care Team: Gayland Curry, DO as PCP - General (Geriatric Medicine) Larey Dresser, MD as PCP - Cardiology (Cardiology) Community, [facility], Clent Demark, MD as Consulting Physician (Ophthalmology) Larey Dresser, MD as Consulting Physician (Cardiology) Heath Lark, MD as Consulting Physician (Hematology and Oncology) Clent Jacks, MD as Consulting Physician (Ophthalmology) Jerrell Belfast, MD as Consulting Physician (Otolaryngology)  ASSESSMENT & PLAN:  Polycythemia vera With recent dose adjustment, her platelet count has improved The patient stated she has been compliant taking medications as directed For now, I do not plan to adjust the dose of hydroxyurea, and she will continue taking 2 capsules daily from Mondays to Fridays and 1 capsule daily on Saturdays and Sunday I plan to see her again in 3 months for further follow-up   No orders of the defined types were placed in this encounter.   All questions were answered. The patient knows to call the clinic with any problems, questions or concerns. The total time spent in the appointment was 10 minutes encounter with patients including review of chart and various tests results, discussions about plan of care and coordination of care plan   Heath Lark, MD 06/22/2019 10:35 AM  INTERVAL HISTORY: Please see below for problem oriented charting. She returns for further chemotherapy and follow-up She is doing well No recent infection, fever or chills No recent bleeding She is compliant taking hydroxyurea as directed  SUMMARY OF ONCOLOGIC HISTORY: Oncology History  Polycythemia vera ([place])  08/05/2011 Pathology Results   Peripheral blood JAK2 mutation was positive with low serum erythropoietin level. Bone marrow aspirate and biopsy was not performed.   08/12/2011 -  Chemotherapy   She is started on hydroxyurea with periodic phlebotomy.     REVIEW OF  SYSTEMS:   Constitutional: Denies fevers, chills or abnormal weight loss Eyes: Denies blurriness of vision Ears, nose, mouth, throat, and face: Denies mucositis or sore throat Respiratory: Denies cough, dyspnea or wheezes Cardiovascular: Denies palpitation, chest discomfort or lower extremity swelling Gastrointestinal:  Denies nausea, heartburn or change in bowel habits Skin: Denies abnormal skin rashes Lymphatics: Denies new lymphadenopathy or easy bruising Neurological:Denies numbness, tingling or new weaknesses Behavioral/Psych: Mood is stable, no new changes  All other systems were reviewed with the patient and are negative.  I have reviewed the past medical history, past surgical history, social history and family history with the patient and they are unchanged from previous note.  ALLERGIES:  is allergic to akten [lidocaine]; amlodipine; and augmentin [amoxicillin-pot clavulanate].  MEDICATIONS:  Current Outpatient Medications  Medication Sig Dispense Refill  . acetaminophen (TYLENOL) 325 MG tablet Take 650 mg by mouth every 4 (four) hours as needed.    [name] Kitchen apixaban (ELIQUIS) 2.5 MG TABS tablet Take 1 tablet (2.5 mg total) by mouth 2 (two) times daily. 180 tablet 3  . fentaNYL (DURAGESIC) 12 MCG/HR PLACE 1 PATCH ONTO THE SKIN EVERY 3 DAYS. REMOVE OLD PATCH. 10 patch 0  . furosemide (LASIX) 40 MG tablet Take 40 mg by mouth 2 (two) times daily. Take 1 1/2 tablet (60mg ) PO 2 times a daily.    [name] Kitchen gabapentin (NEURONTIN) 100 MG capsule TAKE THREE (3) CAPSULES AT BEDTIME. 90 capsule 0  . hydrALAZINE (APRESOLINE) 50 MG tablet TAKE 1 1/2 TABLETS THREE TIMES DAILY. 135 tablet 0  . hydroxyurea (HYDREA) 500 MG capsule TAKE 2 CAPSULEs ON Monday thru FRIDAY, AND 1 capsule sat and sun 100 capsule 0  .  levothyroxine (SYNTHROID) 100 MCG tablet TAKE 1 TABLET ONCE DAILY BEFORE BREAKFAST. 90 tablet 1  . Magnesium 250 MG TABS Take 250 mg by mouth daily.     . Melatonin 10 MG TABS Take 10 mg by mouth at  bedtime.    . metoprolol succinate (TOPROL-XL) 25 MG 24 hr tablet Take 1 tablet (25 mg total) by mouth 2 (two) times daily. 60 tablet 6  . Multiple Vitamins-Minerals (PRESERVISION AREDS 2) CAPS Take 1 capsule by mouth 2 (two) times daily.     . polyethylene glycol powder (MIRALAX) 17 GM/SCOOP powder Take 17 g by mouth in the morning and at bedtime. For 7 days 255 g 0  . potassium chloride (KLOR-CON) 10 MEQ tablet Take 10 mEq by mouth daily.    . sennosides-docusate sodium (SENOKOT-S) 8.6-50 MG tablet Take 2 tablets by mouth 2 (two) times daily.    . sodium chloride (OCEAN) 0.65 % SOLN nasal spray Place 2 sprays into both nostrils 2 (two) times daily.     . traMADol (ULTRAM) 50 MG tablet TAKE ONE TABLET AT BEDTIME. 30 tablet 0   No current facility-administered medications for this visit.    PHYSICAL EXAMINATION: ECOG PERFORMANCE STATUS: 2 - Symptomatic, <50% confined to bed  Vitals:   06/22/19 1013  BP: (!) 100/57  Pulse: 66  Resp: 18  Temp: 98.3 F (36.8 C)  SpO2: 97%   Filed Weights   06/22/19 1013  Weight: 106 lb 6.4 oz (48.3 kg)    GENERAL:alert, no distress and comfortable NEURO: alert & oriented x 3 with fluent speech, no focal motor/sensory deficits  LABORATORY DATA:  I have reviewed the data as listed    Component Value Date/Time   NA 140 06/19/2019 1435   NA 136 (A) 08/10/2017 0000   NA 136 09/14/2016 1428   K 3.8 06/19/2019 1435   K 4.0 09/14/2016 1428   CL 98 06/19/2019 1435   CO2 31 06/19/2019 1435   CO2 30 (H) 09/14/2016 1428   GLUCOSE 89 06/19/2019 1435   GLUCOSE 112 09/14/2016 1428   BUN 50 (H) 06/19/2019 1435   BUN 20 08/10/2017 0000   BUN 14.7 09/14/2016 1428   CREATININE 1.41 (H) 06/19/2019 1435   CREATININE 0.8 09/14/2016 1428   CALCIUM 10.9 (H) 06/19/2019 1435   CALCIUM 11.0 (H) 09/14/2016 1428   PROT 6.6 06/19/2019 1435   PROT 7.0 09/14/2016 1428   ALBUMIN 4.2 06/19/2019 1435   ALBUMIN 4.6 09/14/2016 1428   AST 17 06/19/2019 1435   AST 17  09/14/2016 1428   ALT 19 06/19/2019 1435   ALT 18 09/14/2016 1428   ALKPHOS 48 06/19/2019 1435   ALKPHOS 62 09/14/2016 1428   BILITOT 1.1 06/19/2019 1435   BILITOT 1.00 09/14/2016 1428   GFRNONAA 34 (L) 06/19/2019 1435   GFRAA 39 (L) 06/19/2019 1435    No results found for: SPEP, UPEP  Lab Results  Component Value Date   WBC 7.4 06/22/2019   NEUTROABS 5.4 06/22/2019   HGB 12.1 06/22/2019   HCT 37.2 06/22/2019   MCV 118.5 (H) 06/22/2019   PLT 435 (H) 06/22/2019      Chemistry      Component Value Date/Time   NA 140 06/19/2019 1435   NA 136 (A) 08/10/2017 0000   NA 136 09/14/2016 1428   K 3.8 06/19/2019 1435   K 4.0 09/14/2016 1428   CL 98 06/19/2019 1435   CO2 31 06/19/2019 1435   CO2 30 (H) 09/14/2016 1428  BUN 50 (H) 06/19/2019 1435   BUN 20 08/10/2017 0000   BUN 14.7 09/14/2016 1428   CREATININE 1.41 (H) 06/19/2019 1435   CREATININE 0.8 09/14/2016 1428   GLU 76 08/10/2017 0000      Component Value Date/Time   CALCIUM 10.9 (H) 06/19/2019 1435   CALCIUM 11.0 (H) 09/14/2016 1428   ALKPHOS 48 06/19/2019 1435   ALKPHOS 62 09/14/2016 1428   AST 17 06/19/2019 1435   AST 17 09/14/2016 1428   ALT 19 06/19/2019 1435   ALT 18 09/14/2016 1428   BILITOT 1.1 06/19/2019 1435   BILITOT 1.00 09/14/2016 1428

## 2019-06-25 ENCOUNTER — Other Ambulatory Visit: Payer: Self-pay | Admitting: *Deleted

## 2019-06-25 DIAGNOSIS — M545 Low back pain, unspecified: Secondary | ICD-10-CM

## 2019-06-25 MED ORDER — FENTANYL 12 MCG/HR TD PT72: MEDICATED_PATCH | TRANSDERMAL | 0 refills | Status: DC

## 2019-06-25 NOTE — Telephone Encounter (Signed)
Patient requested refill NCCSRS Database Verified LR: 05/28/2019 Pended Rx and sent to Dr. Mariea Clonts for approval.

## 2019-06-26 ENCOUNTER — Other Ambulatory Visit: Payer: Self-pay | Admitting: Internal Medicine

## 2019-06-26 DIAGNOSIS — M545 Low back pain, unspecified: Secondary | ICD-10-CM

## 2019-06-26 NOTE — Telephone Encounter (Signed)
Routed to Dr. Reed for approval  

## 2019-06-28 ENCOUNTER — Telehealth (HOSPITAL_COMMUNITY): Payer: Self-pay | Admitting: *Deleted

## 2019-06-28 NOTE — Telephone Encounter (Signed)
Pt left VM stating she had another major nose bleed yesterday. I called pt back to see if she had contacted her ENT as she is being closely followed by them for her nose bleeds but patient did not answer/left vm requesting return call.

## 2019-07-02 ENCOUNTER — Other Ambulatory Visit: Payer: Self-pay | Admitting: Internal Medicine

## 2019-07-02 DIAGNOSIS — H61891 Other specified disorders of right external ear: Secondary | ICD-10-CM | POA: Diagnosis not present

## 2019-07-02 DIAGNOSIS — R040 Epistaxis: Secondary | ICD-10-CM | POA: Diagnosis not present

## 2019-07-03 ENCOUNTER — Other Ambulatory Visit (HOSPITAL_COMMUNITY): Payer: Self-pay | Admitting: *Deleted

## 2019-07-03 MED ORDER — HYDRALAZINE HCL 50 MG PO TABS: ORAL_TABLET | ORAL | 3 refills | Status: DC

## 2019-07-05 ENCOUNTER — Encounter (INDEPENDENT_AMBULATORY_CARE_PROVIDER_SITE_OTHER): Payer: Medicare Other | Admitting: Ophthalmology

## 2019-07-06 ENCOUNTER — Telehealth (HOSPITAL_COMMUNITY): Payer: Self-pay | Admitting: *Deleted

## 2019-07-06 NOTE — Telephone Encounter (Signed)
pts ENT doctor said she could restart eliquis on Monday since she had her Cauterization this week. Per Dr.McLean ok to resume eliquis on Monday. Pt aware.

## 2019-07-12 ENCOUNTER — Other Ambulatory Visit: Payer: Self-pay | Admitting: Internal Medicine

## 2019-07-17 ENCOUNTER — Other Ambulatory Visit: Payer: Self-pay

## 2019-07-17 ENCOUNTER — Encounter (INDEPENDENT_AMBULATORY_CARE_PROVIDER_SITE_OTHER): Payer: Self-pay | Admitting: Ophthalmology

## 2019-07-17 ENCOUNTER — Ambulatory Visit (INDEPENDENT_AMBULATORY_CARE_PROVIDER_SITE_OTHER): Payer: Medicare Other | Admitting: Ophthalmology

## 2019-07-17 DIAGNOSIS — H353124 Nonexudative age-related macular degeneration, left eye, advanced atrophic with subfoveal involvement: Secondary | ICD-10-CM | POA: Diagnosis not present

## 2019-07-17 DIAGNOSIS — H353222 Exudative age-related macular degeneration, left eye, with inactive choroidal neovascularization: Secondary | ICD-10-CM | POA: Diagnosis not present

## 2019-07-17 DIAGNOSIS — H353211 Exudative age-related macular degeneration, right eye, with active choroidal neovascularization: Secondary | ICD-10-CM

## 2019-07-17 DIAGNOSIS — H4051X3 Glaucoma secondary to other eye disorders, right eye, severe stage: Secondary | ICD-10-CM

## 2019-07-17 MED ORDER — AFLIBERCEPT 2MG/0.05ML IZ SOLN FOR KALEIDOSCOPE
2.0000 mg | INTRAVITREAL | Status: AC | PRN
  Administered 2019-07-17: 2 mg via INTRAVITREAL

## 2019-07-17 NOTE — Progress Notes (Signed)
07/17/2019     CHIEF COMPLAINT Patient presents for Retina Follow Up   HISTORY OF PRESENT ILLNESS: Sarah Mays is a 86 y.o. female who presents to the clinic today for:   HPI    Retina Follow Up    Patient presents with  Wet AMD.  In right eye.  Severity is moderate.  Duration of 8.5 weeks.  Since onset it is stable.  I, the attending physician,  performed the HPI with the patient and updated documentation appropriately.          Comments    8.5 Week AMD f\u OD. Possible Eylea OD. OCT  Pt states no changes in vision.        Last edited by Hurman Horn, MD on 07/17/2019  4:11 PM. (History)      Referring physician: Gayland Curry, DO [place],  [place] [id number]  HISTORICAL INFORMATION:   Selected notes from the [facility]: No current outpatient medications on file. (Ophthalmic Drugs)   No current facility-administered medications for this visit. (Ophthalmic Drugs)   Current Outpatient Medications (Other)  Medication Sig  . acetaminophen (TYLENOL) 325 MG tablet Take 650 mg by mouth every 4 (four) hours as needed.  [name] Kitchen apixaban (ELIQUIS) 2.5 MG TABS tablet Take 1 tablet (2.5 mg total) by mouth 2 (two) times daily.  . fentaNYL (DURAGESIC) 12 MCG/HR Place one patch onto the skin every 3 days. Remove old patch  . furosemide (LASIX) 40 MG tablet Take 40 mg by mouth 2 (two) times daily. Take 1 1/2 tablet (60mg ) PO 2 times a daily.  [name] Kitchen gabapentin (NEURONTIN) 100 MG capsule TAKE THREE (3) CAPSULES AT BEDTIME.  . hydrALAZINE (APRESOLINE) 50 MG tablet TAKE 1 1/2 TABLETS THREE TIMES DAILY.  . hydroxyurea (HYDREA) 500 MG capsule TAKE 2 CAPSULEs ON Monday thru FRIDAY, AND 1 capsule sat and sun  . levothyroxine (SYNTHROID) 100 MCG tablet TAKE 1 TABLET ONCE DAILY BEFORE BREAKFAST.  . Magnesium 250 MG TABS Take 250 mg by mouth daily.   . Melatonin 10 MG TABS Take 10 mg by mouth at bedtime.  . metoprolol succinate (TOPROL-XL) 25 MG  24 hr tablet Take 1 tablet (25 mg total) by mouth 2 (two) times daily.  . Multiple Vitamins-Minerals (PRESERVISION AREDS 2) CAPS Take 1 capsule by mouth 2 (two) times daily.   . polyethylene glycol powder (MIRALAX) 17 GM/SCOOP powder Take 17 g by mouth in the morning and at bedtime. For 7 days  . potassium chloride (KLOR-CON) 10 MEQ tablet Take 10 mEq by mouth daily.  . sennosides-docusate sodium (SENOKOT-S) 8.6-50 MG tablet Take 2 tablets by mouth 2 (two) times daily.  . sodium chloride (OCEAN) 0.65 % SOLN nasal spray Place 2 sprays into both nostrils 2 (two) times daily.   . traMADol (ULTRAM) 50 MG tablet TAKE ONE TABLET AT BEDTIME.   No current facility-administered medications for this visit. (Other)      REVIEW OF SYSTEMS:    ALLERGIES Allergies  Allergen Reactions  . Akten [Lidocaine]   . Amlodipine Swelling and Other (See Comments)    Lower extremity swelling   . Augmentin [Amoxicillin-Pot Clavulanate] Other (See Comments)    Stomach Pain/cramps Did it involve swelling of the face/tongue/throat, SOB, or low BP? No Did it involve sudden or severe rash/hives, skin peeling, or any reaction on the inside of your mouth or nose? No Did you need to seek medical  attention at a hospital or doctor's office? Unknown When did it last happen?unknown If all above answers are "NO", may proceed with cephalosporin use.     PAST MEDICAL HISTORY Past Medical History:  Diagnosis Date  . Abnormal stress test    a. 11/2011 Ex MV: EF 80%, small, partially reversible anteroapical defect->mild ischemia vs attenuation-->med Rx.  [name] Kitchen Alopecia, unspecified   . Arthritis   . Chronic diastolic CHF (congestive heart failure) ([place])    a. 11/2013 Echo: EF 60-65%, no rwma, mild TR, PASP 77mmHg.  . Closed fracture of lower end of radius with ulna   . Deafness    Left, s/p multiple surgeries  . Essential hypertension   . Gouty arthropathy, unspecified   . History of kidney stones   .  Hypothyroidism   . Insomnia, unspecified   . Macular degeneration    Left, s/p inj. tx  . Mitral valve disorders(424.0)   . Neck mass    right, w/u including MRI negative  . PAF (paroxysmal atrial fibrillation) (HCC)    a. on amio (02/2012 mild obstruction on PFT's) and xarelto.  . Polycythemia vera(238.4)   . Pure hypercholesterolemia   . Senile osteoporosis    Reclast in the past  . Tricuspid valve disorders, specified as nonrheumatic    Past Surgical History:  Procedure Laterality Date  . BREAST BIOPSY  06/18/1998   left  . CARDIOVERSION N/A 07/15/2017   Procedure: CARDIOVERSION;  Surgeon: Larey Dresser, MD;  Location: [facility] ENDOSCOPY;  Service: Cardiovascular;  Laterality: N/A;  . CARDIOVERSION N/A 07/21/2018   Procedure: CARDIOVERSION;  Surgeon: Larey Dresser, MD;  Location: [facility] ENDOSCOPY;  Service: Cardiovascular;  Laterality: N/A;  . CARDIOVERSION N/A 10/06/2018   Procedure: CARDIOVERSION;  Surgeon: Larey Dresser, MD;  Location: [facility] ENDOSCOPY;  Service: Cardiovascular;  Laterality: N/A;  . ELECTROPHYSIOLOGIC STUDY N/A 01/08/2016   Procedure: Atrial Fibrillation Ablation;  Surgeon: Thompson Grayer, MD;  Location: [facility] CV LAB;  Service: Cardiovascular;  Laterality: N/A;  . FEMUR IM NAIL Right 10/08/2015   Procedure: INTRAMEDULLARY (IM) NAIL FEMORAL RIGHT;  Surgeon: Paralee Cancel, MD;  Location: [facility] ORS;  Service: Orthopedics;  Laterality: Right;  . MASS EXCISION Right 11/26/2016   Procedure: EXCISION RIGHT LATERAL NECK MASS;  Surgeon: Jerrell Belfast, MD;  Location: [facility];  Service: ENT;  Laterality: Right;  . TEE WITHOUT CARDIOVERSION N/A 07/15/2017   Procedure: TRANSESOPHAGEAL ECHOCARDIOGRAM (TEE);  Surgeon: Larey Dresser, MD;  Location: [facility] ENDOSCOPY;  Service: Cardiovascular;  Laterality: N/A;  . TONSILLECTOMY    . TOTAL ABDOMINAL HYSTERECTOMY    . TYMPANOPLASTY Bilateral     FAMILY HISTORY Family History  Problem Relation Age of Onset  . Hypertension Father   .  Heart disease Father        patient does not know details.   . Ovarian cancer Mother 68  . Cancer Sister        colon  . Heart disease Sister   . Cancer Sister   . Cancer Sister        hodgkin disease  . Breast cancer Daughter   . Cancer Daughter        breast    SOCIAL HISTORY Social History   Tobacco Use  . Smoking status: Former Smoker    Types: Cigarettes    Quit date: 01/19/1984    Years since quitting: 35.5  . Smokeless tobacco: Never Used  Substance Use Topics  . Alcohol use: Not Currently    Alcohol/week:  1.0 standard drinks    Types: 1 Glasses of wine per week    Comment: 2 per day/ 14 a week  . Drug use: No         OPHTHALMIC EXAM:  Base Eye Exam    Visual Acuity (Snellen - Linear)      Right Left   Dist cc 20/50 + 20/400   Dist ph cc NI NI   Correction: Glasses       Tonometry (Tonopen, 2:56 PM)      Right Left   Pressure 14 14       Pupils      Dark Light Shape React APD   Right 4 3 Round Slow None   Left 4 3 Irregular Slow None       Visual Fields (Counting fingers)      Left Right    Full Full       Neuro/Psych    Oriented x3: Yes   Mood/Affect: Normal       Dilation    Right eye: 1.0% Mydriacyl, 2.5% Phenylephrine @ 2:56 PM        Slit Lamp and Fundus Exam    External Exam      Right Left   External Normal Normal       Slit Lamp Exam      Right Left   Lens Posterior chamber intraocular lens  sutured Posterior chamber intraocular lens   Anterior Vitreous  vitrectomized       Fundus Exam      Right Left   Posterior Vitreous Posterior vitreous detachment vitrectomized   Disc Normal    C/D Ratio 0.35    Macula Atrophy, Age related macular degeneration, Advanced age related macular degeneration, [place]    Vessels Normal    Periphery Normal           IMAGING AND PROCEDURES  Imaging and Procedures for 07/17/19  OCT, Retina - OU - Both Eyes       Right Eye Quality was good. Scan locations included subfoveal.  Central Foveal Thickness: 253. Progression has been stable. Findings include abnormal foveal contour, disciform scar, outer retinal atrophy, no SRF.   Left Eye Quality was good. Scan locations included subfoveal. Central Foveal Thickness: 292. Progression has been stable. Findings include abnormal foveal contour, disciform scar.        Intravitreal Injection, Pharmacologic Agent - OD - Right Eye       Time Out 07/17/2019. 4:15 PM. Confirmed correct patient, procedure, site, and patient consented.   Anesthesia Topical anesthesia was used. Anesthetic medications included Lidocaine 4%.   Procedure Preparation included Ofloxacin , 10% betadine to eyelids. A 30 gauge needle was used.   Injection:  2 mg aflibercept Alfonse Flavors) SOLN   NDC: [id number], Lot: [id number]   Route: Intravitreal, Site: Right Eye, Waste: 0 mg  Post-op Post injection exam found visual acuity of at least counting fingers. The patient tolerated the procedure well. There were no complications. The patient received written and verbal post procedure care education. Post injection medications were not given.                 ASSESSMENT/PLAN:  Exudative age-related macular degeneration of right eye with active choroidal neovascularization (HCC) The nature of wet macular degeneration was discussed with the patient.  Forms of therapy reviewed include the use of Anti-VEGF medications injected painlessly into the eye, as well as other possible treatment modalities, including thermal laser therapy. Fellow eye involvement and risks  were discussed with the patient. Upon the finding of wet age related macular degeneration, treatment will be offered. The treatment regimen is on a treat as needed basis with the intent to treat if necessary and extend interval of exams when possible. On average 1 out of 6 patients do not need lifetime therapy. However, the risk of recurrent disease is high for a lifetime.  Initially monthly, then  periodic, examinations and evaluations will determine whether the next treatment is required on the day of the examination.  OD, stable on 8-week interval examination and intravitreal Eylea, repeat today  Advanced nonexudative age-related macular degeneration of left eye with subfoveal involvement The nature of dry age related macular degeneration was discussed with the patient as well as its possible conversion to wet. The results of the AREDS 2 study was discussed with the patient. A diet rich in dark leafy green vegetables was advised and specific recommendations were made regarding supplements with AREDS 2 formulation . Control of hypertension and serum cholesterol may slow the disease. Smoking cessation is mandatory to slow the disease and diminish the risk of progressing to wet age related macular degeneration. The patient was instructed in the use of an [place] and was told to return immediately for any changes in the Grid. Stressed to the patient do not rub eyes  OS accounts for acuity      ICD-10-CM   1. Exudative age-related macular degeneration of right eye with active choroidal neovascularization (HCC)  H35.3211 OCT, Retina - OU - Both Eyes    Intravitreal Injection, Pharmacologic Agent - OD - Right Eye    aflibercept (EYLEA) SOLN 2 mg  2. Neovascular glaucoma, right eye, severe stage  H40.51X3   3. Advanced nonexudative age-related macular degeneration of left eye with subfoveal involvement  H35.3124   4. Exudative age-related macular degeneration of left eye with inactive choroidal neovascularization (HCC)  H35.3222     1.  2.  3.  Ophthalmic Meds Ordered this visit:  Meds ordered this encounter  Medications  . aflibercept (EYLEA) SOLN 2 mg       Return in about 8 weeks (around 09/11/2019) for OD, dilate, EYLEA OCT.  There are no Patient Instructions on file for this visit.   Explained the diagnoses, plan, and follow up with the patient and they expressed  understanding.  Patient expressed understanding of the importance of proper follow up care.   Clent Demark Maxwell Lemen M.D. Diseases & Surgery of the Retina and Vitreous Retina & Diabetic [place] 07/17/19     Abbreviations: M myopia (nearsighted); A astigmatism; H hyperopia (farsighted); P presbyopia; Mrx spectacle prescription;  CTL contact lenses; OD right eye; OS left eye; OU both eyes  XT exotropia; ET esotropia; PEK punctate epithelial keratitis; PEE punctate epithelial erosions; DES dry eye syndrome; MGD meibomian gland dysfunction; ATs artificial tears; PFAT's preservative free artificial tears; [place] nuclear sclerotic cataract; PSC posterior subcapsular cataract; ERM epi-retinal membrane; PVD posterior vitreous detachment; RD retinal detachment; DM diabetes mellitus; DR diabetic retinopathy; NPDR non-proliferative diabetic retinopathy; PDR proliferative diabetic retinopathy; CSME clinically significant macular edema; DME diabetic macular edema; dbh dot blot hemorrhages; CWS cotton wool spot; POAG primary open angle glaucoma; C/D cup-to-disc ratio; HVF humphrey visual field; GVF goldmann visual field; OCT optical coherence tomography; IOP intraocular pressure; BRVO Branch retinal vein occlusion; CRVO central retinal vein occlusion; CRAO central retinal artery occlusion; BRAO branch retinal artery occlusion; RT retinal tear; SB scleral buckle; PPV pars plana vitrectomy; VH Vitreous hemorrhage; PRP panretinal  laser photocoagulation; IVK intravitreal kenalog; VMT vitreomacular traction; MH Macular hole;  NVD neovascularization of the disc; NVE neovascularization elsewhere; AREDS age related eye disease study; ARMD age related macular degeneration; POAG primary open angle glaucoma; EBMD epithelial/anterior basement membrane dystrophy; ACIOL anterior chamber intraocular lens; IOL intraocular lens; PCIOL posterior chamber intraocular lens; Phaco/IOL phacoemulsification with intraocular lens placement; [place]  photorefractive keratectomy; LASIK laser assisted in situ keratomileusis; HTN hypertension; DM diabetes mellitus; COPD chronic obstructive pulmonary disease

## 2019-07-17 NOTE — Assessment & Plan Note (Signed)
The nature of dry age related macular degeneration was discussed with the patient as well as its possible conversion to wet. The results of the AREDS 2 study was discussed with the patient. A diet rich in dark leafy green vegetables was advised and specific recommendations were made regarding supplements with AREDS 2 formulation . Control of hypertension and serum cholesterol may slow the disease. Smoking cessation is mandatory to slow the disease and diminish the risk of progressing to wet age related macular degeneration. The patient was instructed in the use of an [place] and was told to return immediately for any changes in the Grid. Stressed to the patient do not rub eyes  OS accounts for acuity

## 2019-07-17 NOTE — Assessment & Plan Note (Signed)
The nature of wet macular degeneration was discussed with the patient.  Forms of therapy reviewed include the use of Anti-VEGF medications injected painlessly into the eye, as well as other possible treatment modalities, including thermal laser therapy. Fellow eye involvement and risks were discussed with the patient. Upon the finding of wet age related macular degeneration, treatment will be offered. The treatment regimen is on a treat as needed basis with the intent to treat if necessary and extend interval of exams when possible. On average 1 out of 6 patients do not need lifetime therapy. However, the risk of recurrent disease is high for a lifetime.  Initially monthly, then periodic, examinations and evaluations will determine whether the next treatment is required on the day of the examination.  OD, stable on 8-week interval examination and intravitreal Eylea, repeat today

## 2019-07-23 ENCOUNTER — Other Ambulatory Visit: Payer: Self-pay | Admitting: *Deleted

## 2019-07-23 ENCOUNTER — Other Ambulatory Visit: Payer: Self-pay | Admitting: Hematology and Oncology

## 2019-07-23 DIAGNOSIS — M545 Low back pain, unspecified: Secondary | ICD-10-CM

## 2019-07-23 MED ORDER — FENTANYL 12 MCG/HR TD PT72: MEDICATED_PATCH | TRANSDERMAL | 0 refills | Status: DC

## 2019-07-23 NOTE — Telephone Encounter (Signed)
Patient requested refill NCCSRS Database Verified LR: 06/25/2019 Pended Rx and sent to Dr. Mariea Clonts for approval.

## 2019-07-26 ENCOUNTER — Other Ambulatory Visit: Payer: Self-pay | Admitting: *Deleted

## 2019-07-26 ENCOUNTER — Other Ambulatory Visit: Payer: Self-pay | Admitting: Internal Medicine

## 2019-07-26 DIAGNOSIS — M545 Low back pain, unspecified: Secondary | ICD-10-CM

## 2019-07-26 MED ORDER — TRAMADOL HCL 50 MG PO TABS: 50.0000 mg | ORAL_TABLET | Freq: Every day | ORAL | 0 refills | Status: DC

## 2019-07-26 NOTE — Telephone Encounter (Signed)
Patient called requested refill NCCSRS Database Verified LR: 06/27/2019 Pended Rx and sent to Dr. Mariea Clonts for approval.

## 2019-08-01 ENCOUNTER — Ambulatory Visit: Payer: Self-pay | Admitting: Family

## 2019-08-01 ENCOUNTER — Telehealth: Payer: Self-pay

## 2019-08-01 ENCOUNTER — Encounter: Payer: Self-pay | Admitting: Family Medicine

## 2019-08-01 NOTE — Telephone Encounter (Signed)
Message given to bernadette and patient has an appointment for tomorrow at 2:45

## 2019-08-01 NOTE — Telephone Encounter (Signed)
How long has she had symptoms? Any cough? Recommended starting mucinex (DM if she has cough) by mouth twice daily with full glass of water. Okay to test for COVID  There are appts available tomorrow if needed

## 2019-08-01 NOTE — Telephone Encounter (Signed)
Bernadette from [employer], [employer]) called and is requesting a medication for upper respiratory for Sharma. She stated that patient is having a grayish thick mucus and has a low grade fever. She is also asking  For a verbal order to do a nasal swab for covid although the patient has been vaccinated. When she did the vital she noticed  No abnormal lungs, no wheezing or crackles. Please advise. She wanted her to come in however we are booked.  I can call her back with the verbal order/ and or I have attache a prescriber sheet on your desk.

## 2019-08-02 ENCOUNTER — Other Ambulatory Visit: Payer: Self-pay

## 2019-08-02 ENCOUNTER — Encounter: Payer: Self-pay | Admitting: Family Medicine

## 2019-08-02 ENCOUNTER — Ambulatory Visit (INDEPENDENT_AMBULATORY_CARE_PROVIDER_SITE_OTHER): Payer: Medicare Other | Admitting: Family Medicine

## 2019-08-02 ENCOUNTER — Ambulatory Visit: Payer: Self-pay | Admitting: Nurse Practitioner

## 2019-08-02 DIAGNOSIS — M1711 Unilateral primary osteoarthritis, right knee: Secondary | ICD-10-CM | POA: Diagnosis not present

## 2019-08-02 MED ORDER — DOXYCYCLINE HYCLATE 100 MG PO TABS
100.0000 mg | ORAL_TABLET | Freq: Two times a day (BID) | ORAL | 0 refills | Status: AC
Start: 2019-08-02 — End: 2019-08-09

## 2019-08-02 NOTE — Patient Instructions (Addendum)
Good to see you  Compression sleeve  Ask susan  Doxycycline 2 times a day for7 days  See me again in 4-6 weeks

## 2019-08-02 NOTE — Assessment & Plan Note (Signed)
Degenerative arthritis of the knee.  Known Baker's cyst.  Questionable aspiration would have been done today but unfortunately secondary to patient's pain we only did injection.  Hopefully will be beneficial.  May need to consider aspiration in the near future.  Patient is on a blood thinner but I did not see any true hemarthrosis today.  Discussed compression and how this will be beneficial.  Patient declining any type of stability brace.  Follow-up again in 4 to 6 weeks

## 2019-08-02 NOTE — Progress Notes (Signed)
[facility] [place] Phone: [phone] Subjective:   Sarah Mays, am serving as a scribe for Dr. Hulan Saas. This visit occurred during the SARS-CoV-2 public health emergency.  Safety protocols were in place, including screening questions prior to the visit, additional usage of staff PPE, and extensive cleaning of exam room while observing appropriate contact time as indicated for disinfecting solutions.   I'm seeing this patient by the request  of:  Mariea Clonts, Tiffany L, DO  CC: Right knee pain follow-up  [id number]   01/23/2019 Patient is doing much better after the aspiration.  Has been doing formal physical therapy but encouraged her to discontinue secondary to the coronavirus outbreak at the moment.  Patient will continue with conservative therapy.  With the underlying arthritis patient could be a candidate for viscosupplementation and we can consider getting approval if necessary.  As long as patient does well follow-up with me as needed  Update 08/02/2019 Sarah Mays is a 86 y.o. female coming in with complaint of right knee pain for 2 days. Fell asleep watching tv on the sofa and she had legs propped up. Pain over anterior medial aspect. Using ice and Tylenol.  Patient has had a Baker's cyst before as well as arthritic changes.  Patient states that this is starting to affect her daily activities and walking again.    Past Medical History:  Diagnosis Date  . Abnormal stress test    a. 11/2011 Ex MV: EF 80%, small, partially reversible anteroapical defect->mild ischemia vs attenuation-->med Rx.  [name] Kitchen Alopecia, unspecified   . Arthritis   . Chronic diastolic CHF (congestive heart failure) ([place])    a. 11/2013 Echo: EF 60-65%, Mays rwma, mild TR, PASP 19mmHg.  . Closed fracture of lower end of radius with ulna   . Deafness    Left, s/p multiple surgeries  . Essential hypertension   . Gouty arthropathy, unspecified    . History of kidney stones   . Hypothyroidism   . Insomnia, unspecified   . Macular degeneration    Left, s/p inj. tx  . Mitral valve disorders(424.0)   . Neck mass    right, w/u including MRI negative  . PAF (paroxysmal atrial fibrillation) (HCC)    a. on amio (02/2012 mild obstruction on PFT's) and xarelto.  . Polycythemia vera(238.4)   . Pure hypercholesterolemia   . Senile osteoporosis    Reclast in the past  . Tricuspid valve disorders, specified as nonrheumatic    Past Surgical History:  Procedure Laterality Date  . BREAST BIOPSY  06/18/1998   left  . CARDIOVERSION N/A 07/15/2017   Procedure: CARDIOVERSION;  Surgeon: Larey Dresser, MD;  Location: [facility] ENDOSCOPY;  Service: Cardiovascular;  Laterality: N/A;  . CARDIOVERSION N/A 07/21/2018   Procedure: CARDIOVERSION;  Surgeon: Larey Dresser, MD;  Location: [facility] ENDOSCOPY;  Service: Cardiovascular;  Laterality: N/A;  . CARDIOVERSION N/A 10/06/2018   Procedure: CARDIOVERSION;  Surgeon: Larey Dresser, MD;  Location: [facility] ENDOSCOPY;  Service: Cardiovascular;  Laterality: N/A;  . ELECTROPHYSIOLOGIC STUDY N/A 01/08/2016   Procedure: Atrial Fibrillation Ablation;  Surgeon: Thompson Grayer, MD;  Location: [place] CV LAB;  Service: Cardiovascular;  Laterality: N/A;  . FEMUR IM NAIL Right 10/08/2015   Procedure: INTRAMEDULLARY (IM) NAIL FEMORAL RIGHT;  Surgeon: Paralee Cancel, MD;  Location: [facility] ORS;  Service: Orthopedics;  Laterality: Right;  . MASS EXCISION Right 11/26/2016   Procedure: EXCISION RIGHT LATERAL NECK MASS;  Surgeon:  Jerrell Belfast, MD;  Location: [facility];  Service: ENT;  Laterality: Right;  . TEE WITHOUT CARDIOVERSION N/A 07/15/2017   Procedure: TRANSESOPHAGEAL ECHOCARDIOGRAM (TEE);  Surgeon: Larey Dresser, MD;  Location: [facility] ENDOSCOPY;  Service: Cardiovascular;  Laterality: N/A;  . TONSILLECTOMY    . TOTAL ABDOMINAL HYSTERECTOMY    . TYMPANOPLASTY Bilateral    Social History   Socioeconomic History  . Marital status:  Married    Spouse name: [name]  . Number of children: 2  . Years of education: 82  . Highest education level: Not on file  Occupational History  . Not on file  Tobacco Use  . Smoking status: Former Smoker    Types: Cigarettes    Quit date: 01/19/1984    Years since quitting: 35.5  . Smokeless tobacco: Never Used  Vaping Use  . Vaping Use: Never used  Substance and Sexual Activity  . Alcohol use: Not Currently    Alcohol/week: 1.0 standard drink    Types: 1 Glasses of wine per week    Comment: 2 per day/ 14 a week  . Drug use: Mays  . Sexual activity: Not Currently  Other Topics Concern  . Not on file  Social History Narrative   Patient is Married 1955. 2 kids. 61 grandkid-83 years old in 2016. College graduate [name] [place]).    Lives in apartment,  Independent Living  section at [place] since 02/2013.      Stay at home mother.       Mays Smoking history, Mod. alcohol use.   Patient has a living will, POA      Hobbies: CPU and painting         Social Determinants of Health   Financial Resource Strain:   . Difficulty of Paying Living Expenses:   Food Insecurity:   . Worried About Charity fundraiser in the Last Year:   . Arboriculturist in the Last Year:   Transportation Needs:   . Film/video editor (Medical):   [name] Kitchen Lack of Transportation (Non-Medical):   Physical Activity:   . Days of Exercise per Week:   . Minutes of Exercise per Session:   Stress:   . Feeling of Stress :   Social Connections:   . Frequency of Communication with Friends and Family:   . Frequency of Social Gatherings with Friends and Family:   . Attends Religious Services:   . Active Member of Clubs or Organizations:   . Attends Archivist Meetings:   [name] Kitchen Marital Status:    Allergies  Allergen Reactions  . Akten [Lidocaine]   . Amlodipine Swelling and Other (See Comments)    Lower extremity swelling   . Augmentin [Amoxicillin-Pot Clavulanate] Other (See  Comments)    Stomach Pain/cramps Did it involve swelling of the face/tongue/throat, SOB, or low BP? Mays Did it involve sudden or severe rash/hives, skin peeling, or any reaction on the inside of your mouth or nose? Mays Did you need to seek medical attention at a hospital or doctor's office? Unknown When did it last happen?unknown If all above answers are "Mays", may proceed with cephalosporin use.    Family History  Problem Relation Age of Onset  . Hypertension Father   . Heart disease Father        patient does not know details.   . Ovarian cancer Mother 69  . Cancer Sister        colon  . Heart disease Sister   .  Cancer Sister   . Cancer Sister        hodgkin disease  . Breast cancer Daughter   . Cancer Daughter        breast    Current Outpatient Medications (Endocrine & Metabolic):  .  levothyroxine (SYNTHROID) 100 MCG tablet, TAKE 1 TABLET ONCE DAILY BEFORE BREAKFAST.  Current Outpatient Medications (Cardiovascular):  .  furosemide (LASIX) 40 MG tablet, Take 40 mg by mouth 2 (two) times daily. Take 1 1/2 tablet (60mg ) PO 2 times a daily. .  hydrALAZINE (APRESOLINE) 50 MG tablet, TAKE 1 1/2 TABLETS THREE TIMES DAILY. .  metoprolol succinate (TOPROL-XL) 25 MG 24 hr tablet, Take 1 tablet (25 mg total) by mouth 2 (two) times daily.  Current Outpatient Medications (Respiratory):  .  sodium chloride (OCEAN) 0.65 % SOLN nasal spray, Place 2 sprays into both nostrils 2 (two) times daily.   Current Outpatient Medications (Analgesics):  .  acetaminophen (TYLENOL) 325 MG tablet, Take 650 mg by mouth every 4 (four) hours as needed. .  fentaNYL (DURAGESIC) 12 MCG/HR, Place one patch onto the skin every 3 days. Remove old patch .  traMADol (ULTRAM) 50 MG tablet, Take 1 tablet (50 mg total) by mouth at bedtime.  Current Outpatient Medications (Hematological):  .  apixaban (ELIQUIS) 2.5 MG TABS tablet, Take 1 tablet (2.5 mg total) by mouth 2 (two) times daily.  Current Outpatient  Medications (Other):  .  gabapentin (NEURONTIN) 100 MG capsule, TAKE THREE (3) CAPSULES AT BEDTIME. .  hydroxyurea (HYDREA) 500 MG capsule, TAKE 1 CAPSULE ON MONDAY, WEDNESDAY,AND FRIDAY, AND 2 CAPSULES THE OTHER DAYS OF THE WEEK. .  Magnesium 250 MG TABS, Take 250 mg by mouth daily.  .  Melatonin 10 MG TABS, Take 10 mg by mouth at bedtime. .  Multiple Vitamins-Minerals (PRESERVISION AREDS 2) CAPS, Take 1 capsule by mouth 2 (two) times daily.  .  polyethylene glycol powder (MIRALAX) 17 GM/SCOOP powder, Take 17 g by mouth in the morning and at bedtime. For 7 days .  potassium chloride (KLOR-CON) 10 MEQ tablet, Take 10 mEq by mouth daily. .  sennosides-docusate sodium (SENOKOT-S) 8.6-50 MG tablet, Take 2 tablets by mouth 2 (two) times daily. [name] Kitchen  doxycycline (VIBRA-TABS) 100 MG tablet, Take 1 tablet (100 mg total) by mouth 2 (two) times daily for 7 days.   Reviewed prior external information including notes and imaging from  primary care provider As well as notes that were available from care everywhere and other healthcare systems.  Past medical history, social, surgical and family history all reviewed in electronic medical record.  Mays pertanent information unless stated regarding to the chief complaint.   Review of Systems:  Mays headache, visual changes, nausea, vomiting, diarrhea, constipation, dizziness, abdominal pain, skin rash, fevers, chills, night sweats, weight loss, swollen lymph nodes, body aches,, chest pain, shortness of breath, mood changes. POSITIVE muscle aches, joint swelling  Objective  Blood pressure 118/84, height 5' (1.524 m), weight 110 lb (49.9 kg), SpO2 97 %.   General: Mays apparent distress alert and oriented x3 mood and affect normal, dressed appropriately.  Hard of hearing HEENT: Pupils equal, extraocular movements intact  Respiratory: Patient's speak in full sentences and does not appear short of breath  Cardiovascular: 2+ lower extremity edema, hemosiderin deposits  and tender to palpation Gait antalgic walking with the aid of a walker MSK: Knee: Right valgus deformity noted.  Abnormal thigh to calf ratio.  Tender to palpation over medial and PF joint line.  ROM full in flexion and extension and lower leg rotation. instability with valgus force.  painful patellar compression. Patellar glide with moderate crepitus. Patellar and quadriceps tendons unremarkable. Hamstring and quadriceps strength is normal. Contralateral knee shows arthritic changes but nontender  After informed written and verbal consent, patient was seated on exam table. Right knee was prepped with alcohol swab and utilizing anterolateral approach, patient's right knee space was injected with 4:1  marcaine 0.5%: Kenalog 40mg /dL. Patient tolerated the procedure well without immediate complications.    Impression and Recommendations:     The above documentation has been reviewed and is accurate and complete Lyndal Pulley, DO       Note: This dictation was prepared with Dragon dictation along with smaller phrase technology. Any transcriptional errors that result from this process are unintentional.

## 2019-08-05 ENCOUNTER — Encounter (HOSPITAL_COMMUNITY): Payer: Self-pay

## 2019-08-06 ENCOUNTER — Encounter (HOSPITAL_COMMUNITY): Payer: Self-pay

## 2019-08-14 ENCOUNTER — Encounter: Payer: Self-pay | Admitting: Family Medicine

## 2019-08-15 ENCOUNTER — Non-Acute Institutional Stay: Payer: Medicare Other | Admitting: Internal Medicine

## 2019-08-15 ENCOUNTER — Other Ambulatory Visit: Payer: Self-pay

## 2019-08-15 ENCOUNTER — Encounter: Payer: Self-pay | Admitting: Internal Medicine

## 2019-08-15 VITALS — BP 98/62 | HR 73 | Temp 96.9°F | Ht 60.0 in | Wt 110.6 lb

## 2019-08-15 DIAGNOSIS — I5033 Acute on chronic diastolic (congestive) heart failure: Secondary | ICD-10-CM | POA: Diagnosis not present

## 2019-08-15 DIAGNOSIS — H9113 Presbycusis, bilateral: Secondary | ICD-10-CM

## 2019-08-15 DIAGNOSIS — I480 Paroxysmal atrial fibrillation: Secondary | ICD-10-CM

## 2019-08-15 NOTE — Progress Notes (Signed)
Location:  Occupational psychologist of Service:  Clinic (12)  Provider: Laneshia Pina L. Mariea Clonts, D.O., C.M.D.  Code Status: DNR Goals of Care:  Advanced Directives 08/15/2019  Does Patient Have a Medical Advance Directive? Yes  Type of Paramedic of [place];Out of facility DNR (pink MOST or yellow form)  Does patient want to make changes to medical advance directive? No - Patient declined  Copy of [place] in Chart? Yes - validated most recent copy scanned in chart (See row information)  Would patient like information on creating a medical advance directive? -  Pre-existing out of facility DNR order (yellow form or pink MOST form) Yellow form placed in chart (order not valid for inpatient use)     Chief Complaint  Patient presents with  . Medical Management of Chronic Issues    6 month follow-up   . Foot Swelling    Bilateral foot swelling, right is worse     HPI: Patient is a 86 y.o. female seen today for medical management of chronic diseases.   Her LE edema persists with more on the right than left.   3-4+ right, only 1+ left right now.     She took 5 tablets a day for 2 days of torsemide 20mg  but that didn't help.  Is back on 4 per day so 80mg  torsemide daily.    Her home phone does not work.  We need to let Manuela Schwartz know instead about what she needs to do.    She now says she is sob a lot--she drove over today b/c of her swollen feet instead of walking over to clinic.  She's been wearing compression hose. She says she is on the move so does not elevate them much.  She's gone w/o her knee compression sleeve for 2 days w/o benefit or change with the foot b/c there was concern that it was contributing.  It looks like her baseline weight is around 106 but she's up over 110 recently.  She's had to buy new shoes with the tremendous swelling.  No nose bleeds--she crosses her fingers.    Past Medical History:  Diagnosis Date  .  Abnormal stress test    a. 11/2011 Ex MV: EF 80%, small, partially reversible anteroapical defect->mild ischemia vs attenuation-->med Rx.  [name] Kitchen Alopecia, unspecified   . Arthritis   . Chronic diastolic CHF (congestive heart failure) ([place])    a. 11/2013 Echo: EF 60-65%, no rwma, mild TR, PASP 32mmHg.  . Closed fracture of lower end of radius with ulna   . Deafness    Left, s/p multiple surgeries  . Essential hypertension   . Gouty arthropathy, unspecified   . History of kidney stones   . Hypothyroidism   . Insomnia, unspecified   . Macular degeneration    Left, s/p inj. tx  . Mitral valve disorders(424.0)   . Neck mass    right, w/u including MRI negative  . PAF (paroxysmal atrial fibrillation) (HCC)    a. on amio (02/2012 mild obstruction on PFT's) and xarelto.  . Polycythemia vera(238.4)   . Pure hypercholesterolemia   . Senile osteoporosis    Reclast in the past  . Tricuspid valve disorders, specified as nonrheumatic     Past Surgical History:  Procedure Laterality Date  . BREAST BIOPSY  06/18/1998   left  . CARDIOVERSION N/A 07/15/2017   Procedure: CARDIOVERSION;  Surgeon: Larey Dresser, MD;  Location: [facility] ENDOSCOPY;  Service: Cardiovascular;  Laterality: N/A;  . CARDIOVERSION N/A 07/21/2018   Procedure: CARDIOVERSION;  Surgeon: Larey Dresser, MD;  Location: [name] ENDOSCOPY;  Service: Cardiovascular;  Laterality: N/A;  . CARDIOVERSION N/A 10/06/2018   Procedure: CARDIOVERSION;  Surgeon: Larey Dresser, MD;  Location: [facility] ENDOSCOPY;  Service: Cardiovascular;  Laterality: N/A;  . ELECTROPHYSIOLOGIC STUDY N/A 01/08/2016   Procedure: Atrial Fibrillation Ablation;  Surgeon: Thompson Grayer, MD;  Location: [place] CV LAB;  Service: Cardiovascular;  Laterality: N/A;  . FEMUR IM NAIL Right 10/08/2015   Procedure: INTRAMEDULLARY (IM) NAIL FEMORAL RIGHT;  Surgeon: Paralee Cancel, MD;  Location: [facility] ORS;  Service: Orthopedics;  Laterality: Right;  . MASS EXCISION Right 11/26/2016    Procedure: EXCISION RIGHT LATERAL NECK MASS;  Surgeon: Jerrell Belfast, MD;  Location: [facility];  Service: ENT;  Laterality: Right;  . TEE WITHOUT CARDIOVERSION N/A 07/15/2017   Procedure: TRANSESOPHAGEAL ECHOCARDIOGRAM (TEE);  Surgeon: Larey Dresser, MD;  Location: [facility] ENDOSCOPY;  Service: Cardiovascular;  Laterality: N/A;  . TONSILLECTOMY    . TOTAL ABDOMINAL HYSTERECTOMY    . TYMPANOPLASTY Bilateral     Allergies  Allergen Reactions  . Akten [Lidocaine]   . Amlodipine Swelling and Other (See Comments)    Lower extremity swelling   . Augmentin [Amoxicillin-Pot Clavulanate] Other (See Comments)    Stomach Pain/cramps Did it involve swelling of the face/tongue/throat, SOB, or low BP? No Did it involve sudden or severe rash/hives, skin peeling, or any reaction on the inside of your mouth or nose? No Did you need to seek medical attention at a hospital or doctor's office? Unknown When did it last happen?unknown If all above answers are "NO", may proceed with cephalosporin use.     Outpatient Encounter Medications as of 08/15/2019  Medication Sig  . acetaminophen (TYLENOL) 325 MG tablet Take 650 mg by mouth every 4 (four) hours as needed.  [name] Kitchen apixaban (ELIQUIS) 2.5 MG TABS tablet Take 1 tablet (2.5 mg total) by mouth 2 (two) times daily.  . fentaNYL (DURAGESIC) 12 MCG/HR Place one patch onto the skin every 3 days. Remove old patch  . gabapentin (NEURONTIN) 100 MG capsule Take 100 mg by mouth at bedtime.  . hydrALAZINE (APRESOLINE) 50 MG tablet TAKE 1 1/2 TABLETS THREE TIMES DAILY.  . hydroxyurea (HYDREA) 500 MG capsule Take 500 mg by mouth as directed. 1 on Sat and Sun, 2 tablets all other days. May take with food to minimize GI side effects.  [name] Kitchen levothyroxine (SYNTHROID) 100 MCG tablet TAKE 1 TABLET ONCE DAILY BEFORE BREAKFAST.  . Magnesium 250 MG TABS Take 250 mg by mouth daily.   . Melatonin 10 MG TABS Take 10 mg by mouth at bedtime.  . metoprolol succinate (TOPROL-XL) 25 MG 24 hr  tablet Take 1 tablet (25 mg total) by mouth 2 (two) times daily.  . Multiple Vitamins-Minerals (PRESERVISION AREDS 2) CAPS Take 1 capsule by mouth 2 (two) times daily.   . sennosides-docusate sodium (SENOKOT-S) 8.6-50 MG tablet Take 2 tablets by mouth 2 (two) times daily.  . sodium chloride (OCEAN) 0.65 % SOLN nasal spray Place 2 sprays into both nostrils 2 (two) times daily.   . traMADol (ULTRAM) 50 MG tablet Take 1 tablet (50 mg total) by mouth at bedtime.  . [DISCONTINUED] torsemide (DEMADEX) 20 MG tablet Take 80 mg by mouth daily.  . [DISCONTINUED] furosemide (LASIX) 40 MG tablet Take 40 mg by mouth 2 (two) times daily. Take 1 1/2 tablet (60mg ) PO 2 times a daily.  . [DISCONTINUED]  gabapentin (NEURONTIN) 100 MG capsule TAKE THREE (3) CAPSULES AT BEDTIME.  . [DISCONTINUED] hydroxyurea (HYDREA) 500 MG capsule TAKE 1 CAPSULE ON MONDAY, WEDNESDAY,AND FRIDAY, AND 2 CAPSULES THE OTHER DAYS OF THE WEEK.  . [DISCONTINUED] polyethylene glycol powder (MIRALAX) 17 GM/SCOOP powder Take 17 g by mouth in the morning and at bedtime. For 7 days  . [DISCONTINUED] potassium chloride (KLOR-CON) 10 MEQ tablet Take 10 mEq by mouth daily.   No facility-administered encounter medications on file as of 08/15/2019.    Review of Systems:  Review of Systems  Constitutional: Negative for chills and fever.  HENT: Positive for hearing loss. Negative for congestion.   Respiratory: Positive for cough and shortness of breath.   Cardiovascular: Positive for leg swelling. Negative for chest pain, palpitations, orthopnea and PND.  Gastrointestinal: Negative for abdominal pain.  Genitourinary: Negative for dysuria and frequency.  Musculoskeletal: Negative for falls.  Neurological: Negative for dizziness and loss of consciousness.    Health Maintenance  Topic Date Due  . INFLUENZA VACCINE  09/09/2019  . TETANUS/TDAP  01/13/2027  . DEXA SCAN  Completed  . COVID-19 Vaccine  Completed  . PNA vac Low Risk Adult  Completed      Physical Exam: Vitals:   08/15/19 1539  BP: 98/62  Pulse: 73  Temp: (!) 96.9 F (36.1 C)  TempSrc: Temporal  SpO2: 93%  Weight: 110 lb 9.6 oz (50.2 kg)  Height: 5' (1.524 m)   Body mass index is 21.6 kg/m. Physical Exam Vitals reviewed.  Constitutional:      Appearance: Normal appearance.  HENT:     Ears:     Comments: Hearing loss progressively worse Cardiovascular:     Rate and Rhythm: Rhythm irregular.  Pulmonary:     Effort: Pulmonary effort is normal.     Breath sounds: Rales present.  Musculoskeletal:        General: Normal range of motion.     Comments: Marked swelling of R>L foot  Skin:    General: Skin is warm and dry.  Neurological:     General: No focal deficit present.     Mental Status: She is alert and oriented to person, place, and time.  Psychiatric:        Mood and Affect: Mood normal.     Labs reviewed: Basic Metabolic Panel: Recent Labs    10/13/18 1144 01/22/19 1430 05/29/19 1433 06/19/19 1435 08/20/19 1115  NA 137   < > 138 140 137  K 4.1   < > 3.9 3.8 3.9  CL 101   < > 96* 98 97*  CO2 27   < > 33* 31 29  GLUCOSE 79   < > 103* 89 83  BUN 24*   < > 40* 50* 37*  CREATININE 1.17*   < > 1.14* 1.41* 1.16*  CALCIUM 10.1   < > 10.8* 10.9* 10.5*  MG 2.4  --   --   --   --   TSH 3.622  --   --   --   --    < > = values in this interval not displayed.   Liver Function Tests: Recent Labs    10/13/18 1144 04/09/19 0129 06/19/19 1435  AST 15 20 17   ALT 17 16 19   ALKPHOS 44 86 48  BILITOT 0.6 1.1 1.1  PROT 6.4* 7.2 6.6  ALBUMIN 4.2 4.9 4.2   No results for input(s): LIPASE, AMYLASE in the last 8760 hours. No results for input(s): AMMONIA  in the last 8760 hours. CBC: Recent Labs    04/26/19 1032 04/26/19 1032 05/25/19 1118 06/19/19 1435 06/22/19 0948  WBC 10.4   < > 8.9 7.2 7.4  NEUTROABS 8.4*  --  6.9  --  5.4  HGB 12.5   < > 11.5* 11.8* 12.1  HCT 38.6   < > 34.5* 37.4 37.2  MCV 119.1*   < > 115.4* 122.2* 118.5*  PLT  648*   < > 450* 480* 435*   < > = values in this interval not displayed.   Lipid Panel: No results for input(s): CHOL, HDL, LDLCALC, TRIG, CHOLHDL, LDLDIRECT in the last 8760 hours. No results found for: HGBA1C  Procedures since last visit: No results found.  Assessment/Plan 1. CHF NYHA class III (symptoms with mildly strenuous activities), acute on chronic, diastolic (HCC) -due to rales on exam, considered portable xray in apt -put call into Dr. Aundra Dubin since adding extra torsemide as above had really NOT been effective for her and weight remains up 4-5 lbs from baseline of 106 and has persistent considerable R foot swelling and audible rales  2. Paroxysmal atrial fibrillation (HCC) -continues to cause her difficulty and contribute to CHF and has not been able to maintain NSR  3. Presbycusis of both ears -progressively worse and interferes with communication and affects function -she was not hearing her cell phone ringing today and I sat close to her to communicate with her so she could hear me with my mask on  Labs/tests ordered:  No new Next appt:  09/20/2019  Kloe Oates L. Ahmia Colford, D.O. [facility] [address], [place] [address] Cell Phone (Mon-Fri 8am-5pm):  [phone] On Call:  [phone] & follow prompts after 5pm & weekends Office Phone:  [phone] Office Fax:  [phone]

## 2019-08-16 ENCOUNTER — Telehealth (HOSPITAL_COMMUNITY): Payer: Self-pay | Admitting: *Deleted

## 2019-08-16 NOTE — Telephone Encounter (Signed)
I left a message with her PCP.

## 2019-08-16 NOTE — Telephone Encounter (Signed)
Dr.Tiffany Reed with [facility] left a VM requesting a return call from [place] regarding patients increased shortness of breath and swelling despite medication changes. Call back # [phone]  Routed to [place]

## 2019-08-17 ENCOUNTER — Telehealth: Payer: Self-pay | Admitting: *Deleted

## 2019-08-17 ENCOUNTER — Telehealth (HOSPITAL_COMMUNITY): Payer: Self-pay | Admitting: Cardiology

## 2019-08-17 NOTE — Telephone Encounter (Signed)
-----   Message from Larey Dresser, MD sent at 08/17/2019  4:00 PM EDT ----- Increase torsemide to 80 qam/40 qpm.  She will need BMET next week and appt with NP/PA clinic.

## 2019-08-17 NOTE — Telephone Encounter (Signed)
Attempted to contact patient with instructions-left message on machine  Daughter aware of changes and reports med changes were available via my chart,  Will have labs repeated at follow up 7/12

## 2019-08-17 NOTE — Telephone Encounter (Signed)
I heard back from Dr. Aundra Dubin yesterday--he left me a message and I heard it after hours--and he suggested she take an extra 40mg  of torsemide daily later in the day (I'd suggest if she takes the first 80 at breakfast, take the 40mg  at noon) for 3 days.   If she gets more short of breath over the weekend or the edema continues to get worse, she should pull her cord in her place at [place] for the nurse to check on her.  Please call her back and have her write down the directions.  Thanks.

## 2019-08-17 NOTE — Telephone Encounter (Signed)
Patient notified and agreed and wrote down directions.

## 2019-08-17 NOTE — Telephone Encounter (Signed)
Patient called and stated that she has not heard from anyone regarding her swelling of her Foot/Ankle. Stated that Dr. Mariea Clonts was going to call Dr. Aundra Dubin.  Patient would like for Dr. Mariea Clonts to call her [phone].

## 2019-08-20 ENCOUNTER — Other Ambulatory Visit: Payer: Self-pay

## 2019-08-20 ENCOUNTER — Encounter (HOSPITAL_COMMUNITY): Payer: Self-pay | Admitting: Cardiology

## 2019-08-20 ENCOUNTER — Ambulatory Visit (HOSPITAL_COMMUNITY)
Admission: RE | Admit: 2019-08-20 | Discharge: 2019-08-20 | Disposition: A | Discharge status: Home / Self Care | Payer: Medicare Other | Source: Ambulatory Visit | Attending: Cardiology | Admitting: Cardiology

## 2019-08-20 VITALS — BP 104/64 | HR 82 | Wt 108.0 lb

## 2019-08-20 DIAGNOSIS — I5032 Chronic diastolic (congestive) heart failure: Secondary | ICD-10-CM | POA: Insufficient documentation

## 2019-08-20 DIAGNOSIS — Z7901 Long term (current) use of anticoagulants: Secondary | ICD-10-CM | POA: Insufficient documentation

## 2019-08-20 DIAGNOSIS — I484 Atypical atrial flutter: Secondary | ICD-10-CM | POA: Diagnosis not present

## 2019-08-20 DIAGNOSIS — I110 Hypertensive heart disease with heart failure: Secondary | ICD-10-CM | POA: Insufficient documentation

## 2019-08-20 DIAGNOSIS — Z79890 Hormone replacement therapy (postmenopausal): Secondary | ICD-10-CM | POA: Insufficient documentation

## 2019-08-20 DIAGNOSIS — Z8249 Family history of ischemic heart disease and other diseases of the circulatory system: Secondary | ICD-10-CM | POA: Diagnosis not present

## 2019-08-20 DIAGNOSIS — E039 Hypothyroidism, unspecified: Secondary | ICD-10-CM | POA: Diagnosis not present

## 2019-08-20 DIAGNOSIS — I482 Chronic atrial fibrillation, unspecified: Secondary | ICD-10-CM | POA: Diagnosis not present

## 2019-08-20 DIAGNOSIS — I081 Rheumatic disorders of both mitral and tricuspid valves: Secondary | ICD-10-CM | POA: Insufficient documentation

## 2019-08-20 DIAGNOSIS — Z880 Allergy status to penicillin: Secondary | ICD-10-CM | POA: Insufficient documentation

## 2019-08-20 DIAGNOSIS — D45 Polycythemia vera: Secondary | ICD-10-CM | POA: Diagnosis not present

## 2019-08-20 DIAGNOSIS — Z87891 Personal history of nicotine dependence: Secondary | ICD-10-CM | POA: Insufficient documentation

## 2019-08-20 DIAGNOSIS — I495 Sick sinus syndrome: Secondary | ICD-10-CM | POA: Insufficient documentation

## 2019-08-20 DIAGNOSIS — Z79899 Other long term (current) drug therapy: Secondary | ICD-10-CM | POA: Diagnosis not present

## 2019-08-20 DIAGNOSIS — Z888 Allergy status to other drugs, medicaments and biological substances status: Secondary | ICD-10-CM | POA: Diagnosis not present

## 2019-08-20 DIAGNOSIS — Z881 Allergy status to other antibiotic agents status: Secondary | ICD-10-CM | POA: Diagnosis not present

## 2019-08-20 LAB — BASIC METABOLIC PANEL
Anion gap: 11 (ref 5–15)
BUN: 37 mg/dL — ABNORMAL HIGH (ref 8–23)
CO2: 29 mmol/L (ref 22–32)
Calcium: 10.5 mg/dL — ABNORMAL HIGH (ref 8.9–10.3)
Chloride: 97 mmol/L — ABNORMAL LOW (ref 98–111)
Creatinine, Ser: 1.16 mg/dL — ABNORMAL HIGH (ref 0.44–1.00)
GFR calc Af Amer: 49 mL/min — ABNORMAL LOW (ref >60)
GFR calc non Af Amer: 42 mL/min — ABNORMAL LOW (ref >60)
Glucose, Bld: 83 mg/dL (ref 70–99)
Potassium: 3.9 mmol/L (ref 3.5–5.1)
Sodium: 137 mmol/L (ref 135–145)

## 2019-08-20 MED ORDER — TORSEMIDE 20 MG PO TABS: ORAL_TABLET | ORAL | 3 refills | Status: DC

## 2019-08-20 NOTE — Progress Notes (Signed)
ID:  Sarah Mays, DOB 10-Aug-1930, MRN [id number]   Provider location: [place] Type of Visit: Established patient  PCP:  Gayland Curry, DO  Cardiologist:  Loralie Champagne, MD   History of Present Illness: Sarah Mays is a 86 y.o. female who has a history of paroxysmal atrial fibrillation and chronic diastolic CHF.  In 7/16, she was admitted with chest pain.  Cardiolite showed no ischemia or infarction.  Echo in 6/17 showed EF 60-65%.  She developed problems elevated HR when in atrial fibrillation but bradycardia when in NSR.  She underwent atrial fibrillation ablation in 11/17.  She is tolerating Xarelto without melena or BRBPR.    Echo in 11/18 showed EF 60-65%, PASP 47 mmHg.   TEE-guided DCCV back to NSR in 6/19.  TEE showed EF 55-60% with moderate-severe MR.   She developed multiple episodes of epistaxis with Xarelto use.  We have transitioned her to apixaban 2.5 mg bid and she has not had epistaxis since then.   Echo in 6/20 showed EF 60-65%, normal RV, mild-moderate MR.   She had recurrent atrial fibrillation with DCCV to NSR in 6/20.  She then had atypical flutter with DCCV to NSR in 8/20. Symptomatically worse when in atrial fibrillation/flutter.  At last visit, she was in junctional rhythm. Zio patch x 3 days in 9/20 showed 51% atrial flutter with controlled rate. She saw Dr. Rayann Heman and they decided against repeat ablation. She has been in atrial fibrillation and flutter chronically since then, amiodarone was stopped since it did not keep her NSR.  She returns for followup of CHF and atrial fibrillation/flutter.  She has had increased ankle swelling lately and has had increased shortness of breath for about a month, has to stop after about 100 feet.  Weight is up 2 lbs.  She is now in atrial fibrillation chronically.  She walks with a walker. No lightheadedness or falls.  2 days ago, she increased her torsemide to 80 qam/40 qpm.   ECG  (personally reviewed): atrial fibrillation, rate 79.  Labs (7/16): K 4, creatinine 0.94, calcium 10.8, LFTs normal Labs (9/16): K 4, creatinine 0.9, LDL 69, TSH normal, HCT 42.3 Labs (5/17): K 4.6, creatinine 0.94, HCT 41.5, LFTs normal. Labs (6/17): K 5, creatinine 1.04, LDL 42, HDL 101, TSH normal Labs (12/17): K 3.7, creatinine 0.76, hgb 11.2 Labs (4/18): K 4.6, creatinine 0.91 Labs (10/18): K 4.5, creatinine 0.62 Labs (12/18): hgb 13.5 Labs (1/19): K 4.3, creatinine 0.6 Labs (4/19): hgb 13.5 Labs (7/19): K 4.5, creatinine 0.5, hgb 12.1 Labs (11/19): K 3.7, creatinine 0.95 Labs (12/19): K 3.6, creatinine 0.9, hgb 11.6 Labs (6/20): K 3.7, creatinine 1.01 => 1.17 Labs (9/20): K 4.1, creatinine 1.17 Labs (3/21): K 3.7, creatinine 1.27 Labs (5/21): K 3.8, creatinine 1.4, hgb 10.8, bnp 549  PMH: 1. Atrial fibrillation/flutter: Diagnosed initially in 10/13.  Holter monitor in 11/13 showed atrial fibrillation with average rate 65.  - Atrial fibrillation ablation in 11/17.  - TEE-guided DCCV in 6/19.  - atrial fibrillation with DCCV to NSR in 6/20.  - atypical atrial flutter with DCCV to NSR in 8/20.  - Zio patch x 3 days (9/20): 51% atrial flutter - Now chronic 2. Chronic diastolic CHF: Echo (23/30) with EF 65-70%, mild MR, moderate biatrial enlargement, moderate TR, PA systolic pressure 45 mmHg.   Echo (10/15): EF 60-65%.  Echo (7/16) with EF 60-65%, mild MR.  - Echo (6/17): EF 60-65%, PASP 41  mmHg.  - Echo (11/18): EF 60-65%, PASP 47 mmHg - TEE (6/19): EF 55-60%, mild LVH, mildly decreased RV systolic function, peak RV-RA gradient 40 mmHg, moderate-severe MR with bileaflet MVP, ERO 0.37 cm^2.  - Echo (6/20): EF 60-65%, normal RV, mild-moderate MR, severe biatrial enlargement.  3. ETT-Sestamibi (11/13): Exercised to stage II, small partially reversible anteroapical perfusion defect suggesting mild ischemia versus attenuation, EF 80%.  Cardiolite (7/16) with EF 64%, no ischemia or  infarction.  4. HTN: Lower extremity swelling with amlodipine.  5. Polycythemia vera: Has had phlebotomy only once.  6. Hypothyroidism 7. Macular degeneration 8. TAH 1980 9. Familial hypocalciuric hypercalcemia 10. PFTs (amiodarone use) in 1/14 showed a mild obstructive defect.  11. Degenerative disc disease 12. Sick sinus syndrome: Has not required PPM. Junctional rhythm has been noted in the past.  13. Mitral regurgitation: TEE (6/19) with moderate-severe MR with bileaflet MVP, ERO 0.37 cm^2.  - Echo (6/20) with mild-moderate MR.   Current Outpatient Medications  Medication Sig Dispense Refill   acetaminophen (TYLENOL) 325 MG tablet Take 650 mg by mouth every 4 (four) hours as needed.     apixaban (ELIQUIS) 2.5 MG TABS tablet Take 1 tablet (2.5 mg total) by mouth 2 (two) times daily. 180 tablet 3   fentaNYL (DURAGESIC) 12 MCG/HR Place one patch onto the skin every 3 days. Remove old patch 10 patch 0   gabapentin (NEURONTIN) 100 MG capsule Take 100 mg by mouth at bedtime.     hydrALAZINE (APRESOLINE) 50 MG tablet TAKE 1 1/2 TABLETS THREE TIMES DAILY. 135 tablet 3   hydroxyurea (HYDREA) 500 MG capsule Take 500 mg by mouth as directed. 1 on Sat and Sun, 2 tablets all other days. May take with food to minimize GI side effects.     levothyroxine (SYNTHROID) 100 MCG tablet TAKE 1 TABLET ONCE DAILY BEFORE BREAKFAST. 90 tablet 0   Magnesium 250 MG TABS Take 250 mg by mouth daily.      Melatonin 10 MG TABS Take 10 mg by mouth at bedtime.     metoprolol succinate (TOPROL-XL) 25 MG 24 hr tablet Take 1 tablet (25 mg total) by mouth 2 (two) times daily. 60 tablet 6   Multiple Vitamins-Minerals (PRESERVISION AREDS 2) CAPS Take 1 capsule by mouth 2 (two) times daily.      sennosides-docusate sodium (SENOKOT-S) 8.6-50 MG tablet Take 2 tablets by mouth 2 (two) times daily.     sodium chloride (OCEAN) 0.65 % SOLN nasal spray Place 2 sprays into both nostrils 2 (two) times daily.       torsemide (DEMADEX) 20 MG tablet Take 4 tablets (80 mg total) by mouth in the morning AND 2 tablets (40 mg total) every evening. 180 tablet 3   traMADol (ULTRAM) 50 MG tablet Take 1 tablet (50 mg total) by mouth at bedtime. 30 tablet 0   No current facility-administered medications for this encounter.    Allergies:   Akten [lidocaine], Amlodipine, and Augmentin [amoxicillin-pot clavulanate]   Social History:  The patient  reports that she quit smoking about 35 years ago. Her smoking use included cigarettes. She has never used smokeless tobacco. She reports current alcohol use of about 1.0 standard drink of alcohol per week. She reports that she does not use drugs.   Family History:  The patient's family history includes Breast cancer in her daughter; Cancer in her daughter, sister, sister, and sister; Heart disease in her father and sister; Hypertension in her father; Ovarian cancer (age of  onset: 16) in her mother.   ROS:  Please see the history of present illness.   All other systems are personally reviewed and negative.   Exam:   BP 104/64    Pulse 82    Wt 49 kg (108 lb)    SpO2 93%    BMI 21.09 kg/m  General: NAD Neck: JVP 8 cm w/HJR, no thyromegaly or thyroid nodule.  Lungs: Slight crackles at bases CV: Nondisplaced PMI.  Heart irregular S1/S2, no S3/S4, no murmur.  1+ ankle edema.  No carotid bruit.  Normal pedal pulses.  Abdomen: Soft, nontender, no hepatosplenomegaly, no distention.  Skin: Intact without lesions or rashes.  Neurologic: Alert and oriented x 3.  Psych: Normal affect. Extremities: No clubbing or cyanosis.  HEENT: Normal.   Recent Labs: 10/13/2018: Magnesium 2.4; TSH 3.622 06/19/2019: ALT 19; B Natriuretic Peptide 549.0 06/22/2019: Hemoglobin 12.1; Platelets 435 08/20/2019: BUN 37; Creatinine, Ser 1.16; Potassium 3.9; Sodium 137  Personally reviewed   Wt Readings from Last 3 Encounters:  08/20/19 49 kg (108 lb)  08/15/19 50.2 kg (110 lb 9.6 oz)  08/02/19 49.9  kg (110 lb)      ASSESSMENT AND PLAN:  1. Atrial fibrillation/flutter: First noted in 10/13.  Atrial fibrillation has triggered acute on chronic diastolic CHF in the past.  CHADSVASC score is 3 (age, female gender, HTN, CHF).  She is anticoagulated with apixaban 2.5 mg bid which is appropriate for her age/weight.  She had atrial fibrillation ablation in 11/17.  TEE-guided DCCV in 6/19.  Cardioversion in 6/20 for atrial fibrillation and again in 8/20 for atypical atrial flutter.  Zio patch in 9/20 showed 51% atrial flutter. She saw Dr. Rayann Heman who thought rate-control would probably be the best strategy, recommended against repeat ablation.  She is now off amiodarone and in chronic atrial fibrillation.  - Adequate rate control with Toprol XL 25 mg bid.  - Continue apixaban 2.5 mg bid.  2. Chronic diastolic CHF:  Echo was done in 6/20 with EF 60-65%, normal RV, mild-moderate MR.  NYHA class III, worse recently.  She is mildly volume overloaded on exam.   - She increased torsemide just 2 days ago.  I will have her continue torsemide 80 qam/40 qpm long-term to see if this controls her volume.  - Wear compression stockings during the day.   - BMET today.  3. HTN: BP controlled.    4. Bradycardia: Suspect she has a degree of sick sinus syndrome. Prior junctional rhythm.  No recent bradyarrhythmias.  - Follow rhythm closely on Toprol XL.   5. Mitral regurgitation: Moderate to severe MR in setting of bileaflet prolapse on TEE in 6/19. However, echo in 6/20 showed only mild-moderate MR.   Followup in 1 month with NP/PA to reassess volume.   Signed, Loralie Champagne, MD  08/20/2019  [facility] [address] [facility] [place] [address] [phone] (office) [phone] (fax)

## 2019-08-20 NOTE — Patient Instructions (Signed)
INCREASE Torsemide to 80 mg (4 tabs) in the AM and 40 mg (2 tabs) in the PM  Labs today We will only contact you if something comes back abnormal or we need to make some changes. Otherwise no news is good news!  Your physician recommends that you schedule a follow-up appointment in: 4-6 weeks  in the Advanced Practitioners (PA/NP) Clinic    Do the following things EVERYDAY: 1) Weigh yourself in the morning before breakfast. Write it down and keep it in a log. 2) Take your medicines as prescribed 3) Eat low salt foods--Limit salt (sodium) to 2000 mg per day.  4) Stay as active as you can everyday 5) Limit all fluids for the day to less than 2 liters  At the [facility], you and your health needs are our priority. As part of our continuing mission to provide you with exceptional heart care, we have created designated Provider Care Teams. These Care Teams include your primary Cardiologist (physician) and Advanced Practice Providers (APPs- Physician Assistants and Nurse Practitioners) who all work together to provide you with the care you need, when you need it.   You may see any of the following providers on your designated Care Team at your next follow up: [name] Kitchen Dr Glori Bickers . Dr Loralie Champagne . Darrick Grinder, NP . Lyda Jester, PA . Audry Riles, PharmD   Please be sure to bring in all your medications bottles to every appointment.

## 2019-08-23 ENCOUNTER — Other Ambulatory Visit: Payer: Self-pay | Admitting: *Deleted

## 2019-08-23 DIAGNOSIS — M545 Low back pain, unspecified: Secondary | ICD-10-CM

## 2019-08-23 MED ORDER — FENTANYL 12 MCG/HR TD PT72: MEDICATED_PATCH | TRANSDERMAL | 0 refills | Status: DC

## 2019-08-23 NOTE — Telephone Encounter (Signed)
Patient requested refill Epic LR: 07/23/2019 Pended Rx and sent to Dr. Mariea Clonts for approval.

## 2019-08-29 ENCOUNTER — Other Ambulatory Visit: Payer: Self-pay | Admitting: Internal Medicine

## 2019-08-29 DIAGNOSIS — M545 Low back pain, unspecified: Secondary | ICD-10-CM

## 2019-08-29 NOTE — Telephone Encounter (Signed)
Controlled substance refill    Last OV 08/15/19 Labs done in hospital 08/20/19

## 2019-09-06 ENCOUNTER — Other Ambulatory Visit: Payer: Self-pay | Admitting: Cardiology

## 2019-09-06 ENCOUNTER — Ambulatory Visit (INDEPENDENT_AMBULATORY_CARE_PROVIDER_SITE_OTHER): Payer: Medicare Other | Admitting: Family Medicine

## 2019-09-06 ENCOUNTER — Other Ambulatory Visit: Payer: Self-pay

## 2019-09-06 ENCOUNTER — Ambulatory Visit: Payer: Self-pay

## 2019-09-06 ENCOUNTER — Encounter: Payer: Self-pay | Admitting: Family Medicine

## 2019-09-06 VITALS — BP 104/62 | HR 81 | Ht 60.0 in | Wt 106.0 lb

## 2019-09-06 DIAGNOSIS — M7120 Synovial cyst of popliteal space [Baker], unspecified knee: Secondary | ICD-10-CM | POA: Diagnosis not present

## 2019-09-06 DIAGNOSIS — G8929 Other chronic pain: Secondary | ICD-10-CM | POA: Diagnosis not present

## 2019-09-06 DIAGNOSIS — M25561 Pain in right knee: Secondary | ICD-10-CM

## 2019-09-06 NOTE — Progress Notes (Signed)
[place] Phone: [phone] Subjective:   Sarah Mays, am serving as a scribe for Dr. Hulan Saas. This visit occurred during the SARS-CoV-2 public health emergency.  Safety protocols were in place, including screening questions prior to the visit, additional usage of staff PPE, and extensive cleaning of exam room while observing appropriate contact time as indicated for disinfecting solutions.   I'm seeing this patient by the request  of:  Reed, Tiffany L, DO  CC: Left knee pain follow-up  LEX:[id number]   08/02/2019 Degenerative arthritis of the knee.  Known Baker's cyst.  Questionable aspiration would have been done today but unfortunately secondary to patient's pain we only did injection.  Hopefully will be beneficial.  May need to consider aspiration in the near future.  Patient is on a blood thinner but I did not see any true hemarthrosis today.  Discussed compression and how this will be beneficial.  Patient declining any type of stability brace.  Follow-up again in 4 to 6 weeks  Update 09/06/2019 Sarah Mays is a 86 y.o. female coming in with complaint of right knee pain. Pain increased last week.  Patient was seen previously and given an injection in the knee.  We discussed the also had a Baker's cyst.  Seem to be more of a rupture as we only did the steroid injection that did work initially.    Past Medical History:  Diagnosis Date  . Abnormal stress test    a. 11/2011 Ex MV: EF 80%, small, partially reversible anteroapical defect->mild ischemia vs attenuation-->med Rx.  [name] Kitchen Alopecia, unspecified   . Arthritis   . Chronic diastolic CHF (congestive heart failure) ([place])    a. 11/2013 Echo: EF 60-65%, Mays rwma, mild TR, PASP 42mmHg.  . Closed fracture of lower end of radius with ulna   . Deafness    Left, s/p multiple surgeries  . Essential hypertension   . Gouty arthropathy, unspecified   .  History of kidney stones   . Hypothyroidism   . Insomnia, unspecified   . Macular degeneration    Left, s/p inj. tx  . Mitral valve disorders(424.0)   . Neck mass    right, w/u including MRI negative  . PAF (paroxysmal atrial fibrillation) ([facility])    a. on amio (02/2012 mild obstruction on PFT's) and xarelto.  . Polycythemia vera(238.4)   . Pure hypercholesterolemia   . Senile osteoporosis    Reclast in the past  . Tricuspid valve disorders, specified as nonrheumatic    Past Surgical History:  Procedure Laterality Date  . BREAST BIOPSY  06/18/1998   left  . CARDIOVERSION N/A 07/15/2017   Procedure: CARDIOVERSION;  Surgeon: Larey Dresser, MD;  Location: [facility] ENDOSCOPY;  Service: Cardiovascular;  Laterality: N/A;  . CARDIOVERSION N/A 07/21/2018   Procedure: CARDIOVERSION;  Surgeon: Larey Dresser, MD;  Location: [facility] ENDOSCOPY;  Service: Cardiovascular;  Laterality: N/A;  . CARDIOVERSION N/A 10/06/2018   Procedure: CARDIOVERSION;  Surgeon: Larey Dresser, MD;  Location: [facility] ENDOSCOPY;  Service: Cardiovascular;  Laterality: N/A;  . ELECTROPHYSIOLOGIC STUDY N/A 01/08/2016   Procedure: Atrial Fibrillation Ablation;  Surgeon: Thompson Grayer, MD;  Location: [facility] CV LAB;  Service: Cardiovascular;  Laterality: N/A;  . FEMUR IM NAIL Right 10/08/2015   Procedure: INTRAMEDULLARY (IM) NAIL FEMORAL RIGHT;  Surgeon: Paralee Cancel, MD;  Location: [facility] ORS;  Service: Orthopedics;  Laterality: Right;  . MASS EXCISION Right 11/26/2016   Procedure:  EXCISION RIGHT LATERAL NECK MASS;  Surgeon: Jerrell Belfast, MD;  Location: [place];  Service: ENT;  Laterality: Right;  . TEE WITHOUT CARDIOVERSION N/A 07/15/2017   Procedure: TRANSESOPHAGEAL ECHOCARDIOGRAM (TEE);  Surgeon: Larey Dresser, MD;  Location: [facility] ENDOSCOPY;  Service: Cardiovascular;  Laterality: N/A;  . TONSILLECTOMY    . TOTAL ABDOMINAL HYSTERECTOMY    . TYMPANOPLASTY Bilateral    Social History   Socioeconomic History  . Marital status:  Married    Spouse name: [name]  . Number of children: 2  . Years of education: 28  . Highest education level: Not on file  Occupational History  . Not on file  Tobacco Use  . Smoking status: Former Smoker    Types: Cigarettes    Quit date: 01/19/1984    Years since quitting: 35.6  . Smokeless tobacco: Never Used  Vaping Use  . Vaping Use: Never used  Substance and Sexual Activity  . Alcohol use: Yes    Alcohol/week: 1.0 standard drink    Types: 1 Glasses of wine per week    Comment: Every evening   . Drug use: Mays  . Sexual activity: Not Currently  Other Topics Concern  . Not on file  Social History Narrative   Patient is Married 1955. 2 kids. 44 grandkid-69 years old in 2016. College graduate [name] [place]).    Lives in apartment,  Independent Living  section at [facility] since 02/2013.      Stay at home mother.       Mays Smoking history, Mod. alcohol use.   Patient has a living will, POA      Hobbies: CPU and painting         Social Determinants of Health   Financial Resource Strain:   . Difficulty of Paying Living Expenses:   Food Insecurity:   . Worried About Charity fundraiser in the Last Year:   . Arboriculturist in the Last Year:   Transportation Needs:   . Film/video editor (Medical):   [name] Kitchen Lack of Transportation (Non-Medical):   Physical Activity:   . Days of Exercise per Week:   . Minutes of Exercise per Session:   Stress:   . Feeling of Stress :   Social Connections:   . Frequency of Communication with Friends and Family:   . Frequency of Social Gatherings with Friends and Family:   . Attends Religious Services:   . Active Member of Clubs or Organizations:   . Attends Archivist Meetings:   [name] Kitchen Marital Status:    Allergies  Allergen Reactions  . Akten [Lidocaine]   . Amlodipine Swelling and Other (See Comments)    Lower extremity swelling   . Augmentin [Amoxicillin-Pot Clavulanate] Other (See Comments)     Stomach Pain/cramps Did it involve swelling of the face/tongue/throat, SOB, or low BP? Mays Did it involve sudden or severe rash/hives, skin peeling, or any reaction on the inside of your mouth or nose? Mays Did you need to seek medical attention at a hospital or doctor's office? Unknown When did it last happen?unknown If all above answers are "Mays", may proceed with cephalosporin use.    Family History  Problem Relation Age of Onset  . Hypertension Father   . Heart disease Father        patient does not know details.   . Ovarian cancer Mother 38  . Cancer Sister        colon  .  Heart disease Sister   . Cancer Sister   . Cancer Sister        hodgkin disease  . Breast cancer Daughter   . Cancer Daughter        breast    Current Outpatient Medications (Endocrine & Metabolic):  .  levothyroxine (SYNTHROID) 100 MCG tablet, TAKE 1 TABLET ONCE DAILY BEFORE BREAKFAST.  Current Outpatient Medications (Cardiovascular):  .  hydrALAZINE (APRESOLINE) 50 MG tablet, TAKE 1 1/2 TABLETS THREE TIMES DAILY. .  metoprolol succinate (TOPROL-XL) 25 MG 24 hr tablet, Take 1 tablet (25 mg total) by mouth 2 (two) times daily. [name] Kitchen  torsemide (DEMADEX) 20 MG tablet, Take 4 tablets (80 mg total) by mouth in the morning AND 2 tablets (40 mg total) every evening.  Current Outpatient Medications (Respiratory):  .  sodium chloride (OCEAN) 0.65 % SOLN nasal spray, Place 2 sprays into both nostrils 2 (two) times daily.   Current Outpatient Medications (Analgesics):  .  acetaminophen (TYLENOL) 325 MG tablet, Take 650 mg by mouth every 4 (four) hours as needed. .  fentaNYL (DURAGESIC) 12 MCG/HR, Place one patch onto the skin every 3 days. Remove old patch .  traMADol (ULTRAM) 50 MG tablet, TAKE ONE TABLET AT BEDTIME.  Current Outpatient Medications (Hematological):  .  apixaban (ELIQUIS) 2.5 MG TABS tablet, Take 1 tablet (2.5 mg total) by mouth 2 (two) times daily.  Current Outpatient Medications (Other):  .   gabapentin (NEURONTIN) 100 MG capsule, Take 100 mg by mouth at bedtime. .  hydroxyurea (HYDREA) 500 MG capsule, Take 500 mg by mouth as directed. 1 on Sat and Sun, 2 tablets all other days. May take with food to minimize GI side effects. .  Magnesium 250 MG TABS, Take 250 mg by mouth daily.  .  Melatonin 10 MG TABS, Take 10 mg by mouth at bedtime. .  Multiple Vitamins-Minerals (PRESERVISION AREDS 2) CAPS, Take 1 capsule by mouth 2 (two) times daily.  .  sennosides-docusate sodium (SENOKOT-S) 8.6-50 MG tablet, Take 2 tablets by mouth 2 (two) times daily.   Reviewed prior external information including notes and imaging from  primary care provider As well as notes that were available from care everywhere and other healthcare systems.  Past medical history, social, surgical and family history all reviewed in electronic medical record.  Mays pertanent information unless stated regarding to the chief complaint.   Review of Systems:  Mays headache, visual changes, nausea, vomiting, diarrhea, constipation, dizziness, abdominal pain, skin rash, fevers, chills, night sweats, weight loss, swollen lymph nodes, body aches, joint swelling, chest pain, shortness of breath, mood changes. POSITIVE muscle aches  Objective  Blood pressure (!) 104/62, pulse 81, height 5' (1.524 m), weight 106 lb (48.1 kg), SpO2 95 %.   General: Mays apparent distress alert and oriented x3 mood and affect normal, dressed appropriately.  HEENT: Pupils equal, extraocular movements intact  Respiratory: Patient's speak in full sentences and does not appear short of breath   Gait using a rolling walker weakness of the lower extremities noted Left knee exam shows significant arthritic changes medially.  Instability with valgus and varus force.  Significant enlargement of the cyst noted in the popliteal area.  Tender to palpation diffusely.  Lacks last 5 degrees of extension in the last 15 degrees of flexion  Limited musculoskeletal  ultrasound was performed and interpreted by Lyndal Pulley  Limited ultrasound of patient's left knee shows a loculated Baker's cyst noted.  Fairly large with greater than 8  cm in length.  Procedure: Real-time Ultrasound Guided Injection of left knee Device: GE Logiq Q7 Ultrasound guided injection is preferred based studies that show increased duration, increased effect, greater accuracy, decreased procedural pain, increased response rate, and decreased cost with ultrasound guided versus blind injection.  Verbal informed consent obtained.  Time-out conducted.  Noted Mays overlying erythema, induration, or other signs of local infection.  Skin prepped in a sterile fashion.  Local anesthesia: Topical Ethyl chloride.  With sterile technique and under real time ultrasound guidance: With a 22-gauge 2 inch needle patient was injected with 4 cc of 0.5% Marcaine and aspirated 35 cc systolic colored fluid then injected 1 cc of Kenalog 40 mg/dL. This was from a posterior approach.  Patient's left knee Baker's cyst does have multiple loculations so there is still significant amount of fluid remaining. Completed without difficulty  Pain immediately resolved suggesting accurate placement of the medication.  Advised to call if fevers/chills, erythema, induration, drainage, or persistent bleeding.  Images permanently stored and available for review in the ultrasound unit.  Impression: Technically successful ultrasound guided injection.   Impression and Recommendations:     The above documentation has been reviewed and is accurate and complete Lyndal Pulley, DO       Note: This dictation was prepared with Dragon dictation along with smaller phrase technology. Any transcriptional errors that result from this process are unintentional.

## 2019-09-06 NOTE — Assessment & Plan Note (Signed)
Recurrent aspiration done today.  This is a loculated Baker's cyst and unable to remove all the fluid.  Does have some mild normal mild increase Doppler flow surrounding the area but I believe it is secondary to more of the soft tissue expansion.  We discussed advanced imaging in this individual but I do not think it would change medical management.  Patient is to increase activity slowly over the course the next several weeks.  Discussed icing regimen.  Follow-up again 6 to 8 weeks.

## 2019-09-06 NOTE — Patient Instructions (Addendum)
Good to see you  Ice 20 minutes 2 times daily. Usually after activity and before bed. compression sleeve daily could help  We can try gel but I think this will help  See me again in 6-8 weeks

## 2019-09-07 ENCOUNTER — Other Ambulatory Visit: Payer: Self-pay | Admitting: Cardiology

## 2019-09-10 ENCOUNTER — Other Ambulatory Visit: Payer: Self-pay | Admitting: Cardiology

## 2019-09-10 ENCOUNTER — Other Ambulatory Visit (HOSPITAL_COMMUNITY): Payer: Self-pay | Admitting: *Deleted

## 2019-09-11 ENCOUNTER — Encounter (INDEPENDENT_AMBULATORY_CARE_PROVIDER_SITE_OTHER): Payer: Self-pay | Admitting: Ophthalmology

## 2019-09-11 ENCOUNTER — Ambulatory Visit (INDEPENDENT_AMBULATORY_CARE_PROVIDER_SITE_OTHER): Payer: Medicare Other | Admitting: Ophthalmology

## 2019-09-11 ENCOUNTER — Other Ambulatory Visit: Payer: Self-pay

## 2019-09-11 DIAGNOSIS — H353211 Exudative age-related macular degeneration, right eye, with active choroidal neovascularization: Secondary | ICD-10-CM | POA: Diagnosis not present

## 2019-09-11 MED ORDER — AFLIBERCEPT 2MG/0.05ML IZ SOLN FOR KALEIDOSCOPE
2.0000 mg | INTRAVITREAL | Status: AC | PRN
  Administered 2019-09-11: 2 mg via INTRAVITREAL

## 2019-09-11 NOTE — Assessment & Plan Note (Signed)
Active CNVM OD in the past, now stabilized and controlled at 8-week exam interval, status post injection Eylea.  We will repeat injection today and examination in 8 weeks in this  monocular patient

## 2019-09-11 NOTE — Progress Notes (Signed)
09/11/2019     CHIEF COMPLAINT Patient presents for Retina Follow Up   HISTORY OF PRESENT ILLNESS: Sarah Mays is a 86 y.o. female who presents to the clinic today for:   HPI    Retina Follow Up    Patient presents with  Wet AMD.  In right eye.  This started 8 weeks ago.  Severity is moderate.  Duration of 8 weeks.  Since onset it is stable.          Comments    8 Week AMD F/U OD, poss Eylea OD  Pt sts VA OD is "about the same" but "might be a little worse." VA stable OS. No other new symptoms reported OU.       Last edited by Rockie Neighbours, [place] on 09/11/2019  3:15 PM. (History)      Referring physician: Gayland Curry, DO [place],  [place] [address]  HISTORICAL INFORMATION:   Selected notes from the [place]: No current outpatient medications on file. (Ophthalmic Drugs)   No current facility-administered medications for this visit. (Ophthalmic Drugs)   Current Outpatient Medications (Other)  Medication Sig  . acetaminophen (TYLENOL) 325 MG tablet Take 650 mg by mouth every 4 (four) hours as needed.  [name] Kitchen apixaban (ELIQUIS) 2.5 MG TABS tablet Take 1 tablet (2.5 mg total) by mouth 2 (two) times daily.  . fentaNYL (DURAGESIC) 12 MCG/HR Place one patch onto the skin every 3 days. Remove old patch  . gabapentin (NEURONTIN) 100 MG capsule Take 100 mg by mouth at bedtime.  . hydrALAZINE (APRESOLINE) 50 MG tablet TAKE 1 1/2 TABLETS THREE TIMES DAILY.  . hydroxyurea (HYDREA) 500 MG capsule Take 500 mg by mouth as directed. 1 on Sat and Sun, 2 tablets all other days. May take with food to minimize GI side effects.  [name] Kitchen levothyroxine (SYNTHROID) 100 MCG tablet TAKE 1 TABLET ONCE DAILY BEFORE BREAKFAST.  . Magnesium 250 MG TABS Take 250 mg by mouth daily.   . Melatonin 10 MG TABS Take 10 mg by mouth at bedtime.  . metoprolol succinate (TOPROL-XL) 25 MG 24 hr tablet Take 1 tablet (25 mg total) by mouth 2 (two) times daily.  .  Multiple Vitamins-Minerals (PRESERVISION AREDS 2) CAPS Take 1 capsule by mouth 2 (two) times daily.   . sennosides-docusate sodium (SENOKOT-S) 8.6-50 MG tablet Take 2 tablets by mouth 2 (two) times daily.  . sodium chloride (OCEAN) 0.65 % SOLN nasal spray Place 2 sprays into both nostrils 2 (two) times daily.   [name] Kitchen torsemide (DEMADEX) 20 MG tablet Take 4 tablets (80 mg total) by mouth in the morning AND 2 tablets (40 mg total) every evening.  . traMADol (ULTRAM) 50 MG tablet TAKE ONE TABLET AT BEDTIME.   No current facility-administered medications for this visit. (Other)      REVIEW OF SYSTEMS:    ALLERGIES Allergies  Allergen Reactions  . Akten [Lidocaine]   . Amlodipine Swelling and Other (See Comments)    Lower extremity swelling   . Augmentin [Amoxicillin-Pot Clavulanate] Other (See Comments)    Stomach Pain/cramps Did it involve swelling of the face/tongue/throat, SOB, or low BP? No Did it involve sudden or severe rash/hives, skin peeling, or any reaction on the inside of your mouth or nose? No Did you need to seek medical attention at a hospital or doctor's office? Unknown When did it last happen?unknown If all above answers are "NO",  may proceed with cephalosporin use.     PAST MEDICAL HISTORY Past Medical History:  Diagnosis Date  . Abnormal stress test    a. 11/2011 Ex MV: EF 80%, small, partially reversible anteroapical defect->mild ischemia vs attenuation-->med Rx.  [name] Kitchen Alopecia, unspecified   . Arthritis   . Chronic diastolic CHF (congestive heart failure) ([facility])    a. 11/2013 Echo: EF 60-65%, no rwma, mild TR, PASP 41mmHg.  . Closed fracture of lower end of radius with ulna   . Deafness    Left, s/p multiple surgeries  . Essential hypertension   . Gouty arthropathy, unspecified   . History of kidney stones   . Hypothyroidism   . Insomnia, unspecified   . Macular degeneration    Left, s/p inj. tx  . Mitral valve disorders(424.0)   . Neck mass     right, w/u including MRI negative  . PAF (paroxysmal atrial fibrillation) (HCC)    a. on amio (02/2012 mild obstruction on PFT's) and xarelto.  . Polycythemia vera(238.4)   . Pure hypercholesterolemia   . Senile osteoporosis    Reclast in the past  . Tricuspid valve disorders, specified as nonrheumatic    Past Surgical History:  Procedure Laterality Date  . BREAST BIOPSY  06/18/1998   left  . CARDIOVERSION N/A 07/15/2017   Procedure: CARDIOVERSION;  Surgeon: Larey Dresser, MD;  Location: [facility] ENDOSCOPY;  Service: Cardiovascular;  Laterality: N/A;  . CARDIOVERSION N/A 07/21/2018   Procedure: CARDIOVERSION;  Surgeon: Larey Dresser, MD;  Location[facility] ENDOSCOPY;  Service: Cardiovascular;  Laterality: N/A;  . CARDIOVERSION N/A 10/06/2018   Procedure: CARDIOVERSION;  Surgeon: Larey Dresser, MD;  Location: [facility] ENDOSCOPY;  Service: Cardiovascular;  Laterality: N/A;  . ELECTROPHYSIOLOGIC STUDY N/A 01/08/2016   Procedure: Atrial Fibrillation Ablation;  Surgeon: Thompson Grayer, MD;  Location: [place] CV LAB;  Service: Cardiovascular;  Laterality: N/A;  . FEMUR IM NAIL Right 10/08/2015   Procedure: INTRAMEDULLARY (IM) NAIL FEMORAL RIGHT;  Surgeon: Paralee Cancel, MD;  Location: [facility] ORS;  Service: Orthopedics;  Laterality: Right;  . MASS EXCISION Right 11/26/2016   Procedure: EXCISION RIGHT LATERAL NECK MASS;  Surgeon: Jerrell Belfast, MD;  Location: [place];  Service: ENT;  Laterality: Right;  . TEE WITHOUT CARDIOVERSION N/A 07/15/2017   Procedure: TRANSESOPHAGEAL ECHOCARDIOGRAM (TEE);  Surgeon: Larey Dresser, MD;  Location: [facility] ENDOSCOPY;  Service: Cardiovascular;  Laterality: N/A;  . TONSILLECTOMY    . TOTAL ABDOMINAL HYSTERECTOMY    . TYMPANOPLASTY Bilateral     FAMILY HISTORY Family History  Problem Relation Age of Onset  . Hypertension Father   . Heart disease Father        patient does not know details.   . Ovarian cancer Mother 29  . Cancer Sister        colon  . Heart disease Sister    . Cancer Sister   . Cancer Sister        hodgkin disease  . Breast cancer Daughter   . Cancer Daughter        breast    SOCIAL HISTORY Social History   Tobacco Use  . Smoking status: Former Smoker    Types: Cigarettes    Quit date: 01/19/1984    Years since quitting: 35.6  . Smokeless tobacco: Never Used  Vaping Use  . Vaping Use: Never used  Substance Use Topics  . Alcohol use: Yes    Alcohol/week: 1.0 standard drink    Types: 1 Glasses of wine  per week    Comment: Every evening   . Drug use: No         OPHTHALMIC EXAM:  Base Eye Exam    Visual Acuity (ETDRS)      Right Left   Dist cc 20/40 CF @ 2'   Dist ph cc NI NI   Correction: Glasses       Tonometry (Tonopen, 3:19 PM)      Right Left   Pressure 17 19       Pupils      Pupils Dark Light Shape React APD   Right PERRL 4 3 Round Brisk None   Left PERRL 4 3 Round Brisk None       Visual Fields (Counting fingers)      Left Right    Full Full       Extraocular Movement      Right Left    Full Full       Neuro/Psych    Oriented x3: Yes   Mood/Affect: Normal       Dilation    Right eye: 1.0% Mydriacyl, 2.5% Phenylephrine @ 3:20 PM        Slit Lamp and Fundus Exam    External Exam      Right Left   External Normal Normal       Slit Lamp Exam      Right Left   Lids/Lashes Normal    Conjunctiva/Sclera White and quiet    Cornea Clear    Anterior Chamber Deep and quiet    Iris Round and reactive    Lens Posterior chamber intraocular lens  sutured Posterior chamber intraocular lens   Anterior Vitreous Normal vitrectomized       Fundus Exam      Right Left   Posterior Vitreous Posterior vitreous detachment vitrectomized   Disc Normal    C/D Ratio 0.45    Macula Atrophy, Age related macular degeneration, Advanced age related macular degeneration, [place], Geographic atrophy, no macular thickening, no hemorrhage    Vessels Normal    Periphery Normal           IMAGING AND  PROCEDURES  Imaging and Procedures for 09/11/19  OCT, Retina - OU - Both Eyes       Right Eye Quality was good. Scan locations included subfoveal. Central Foveal Thickness: 244. Progression has been stable. Findings include subretinal scarring, no SRF, no IRF, pigment epithelial detachment, disciform scar.   Left Eye Quality was good. Scan locations included subfoveal. Central Foveal Thickness: 284. Progression has been stable. Findings include abnormal foveal contour, disciform scar, no IRF, no SRF.   Notes No foveal atrophy, centrally, yet with preserved visual acuity.  Prior active CNVM now disciform scar temporal and nasal to the fovea OD       Intravitreal Injection, Pharmacologic Agent - OD - Right Eye       Time Out 09/11/2019. 4:21 PM. Confirmed correct patient, procedure, site, and patient consented.   Anesthesia Topical anesthesia was used. Anesthetic medications included Lidocaine 4%.   Procedure Preparation included Ofloxacin , 10% betadine to eyelids. A 30 gauge needle was used.   Injection:  2 mg aflibercept Alfonse Flavors) SOLN   NDC: [id number], Lot: [id number]   Route: Intravitreal, Site: Right Eye, Waste: 0 mg  Post-op Post injection exam found visual acuity of at least counting fingers. The patient tolerated the procedure well. There were no complications. The patient received written and verbal post procedure care education.  Notes Lidocaine topically applied inferotemporal with sterile Q-tip.                ASSESSMENT/PLAN:  Exudative age-related macular degeneration of right eye with active choroidal neovascularization (HCC) Active CNVM OD in the past, now stabilized and controlled at 8-week exam interval, status post injection Eylea.  We will repeat injection today and examination in 8 weeks in this  monocular patient      ICD-10-CM   1. Exudative age-related macular degeneration of right eye with active choroidal neovascularization (HCC)   H35.3211 OCT, Retina - OU - Both Eyes    Intravitreal Injection, Pharmacologic Agent - OD - Right Eye    aflibercept (EYLEA) SOLN 2 mg    1.  Repeat injection intravitreal Eylea today at 8-week interval in this monocular patient  2.  Disciform scar is now controlled with no active areas.  3.  Ophthalmic Meds Ordered this visit:  Meds ordered this encounter  Medications  . aflibercept (EYLEA) SOLN 2 mg       Return for DILATE OU, EYLEA OCT, OD.  Patient Instructions  Patient instructed to return promptly for new visual acuity decline, or visual acuity distortions    Explained the diagnoses, plan, and follow up with the patient and they expressed understanding.  Patient expressed understanding of the importance of proper follow up care.   Clent Demark Patsie Mccardle M.D. Diseases & Surgery of the Retina and Vitreous Retina & Diabetic [place] 09/11/19     Abbreviations: M myopia (nearsighted); A astigmatism; H hyperopia (farsighted); P presbyopia; Mrx spectacle prescription;  CTL contact lenses; OD right eye; OS left eye; OU both eyes  XT exotropia; ET esotropia; PEK punctate epithelial keratitis; PEE punctate epithelial erosions; DES dry eye syndrome; MGD meibomian gland dysfunction; ATs artificial tears; PFAT's preservative free artificial tears; Strang nuclear sclerotic cataract; PSC posterior subcapsular cataract; ERM epi-retinal membrane; PVD posterior vitreous detachment; RD retinal detachment; DM diabetes mellitus; DR diabetic retinopathy; NPDR non-proliferative diabetic retinopathy; PDR proliferative diabetic retinopathy; CSME clinically significant macular edema; DME diabetic macular edema; dbh dot blot hemorrhages; CWS cotton wool spot; POAG primary open angle glaucoma; C/D cup-to-disc ratio; HVF humphrey visual field; GVF goldmann visual field; OCT optical coherence tomography; IOP intraocular pressure; BRVO Branch retinal vein occlusion; CRVO central retinal vein occlusion; CRAO  central retinal artery occlusion; BRAO branch retinal artery occlusion; RT retinal tear; SB scleral buckle; PPV pars plana vitrectomy; VH Vitreous hemorrhage; PRP panretinal laser photocoagulation; IVK intravitreal kenalog; VMT vitreomacular traction; MH Macular hole;  NVD neovascularization of the disc; NVE neovascularization elsewhere; AREDS age related eye disease study; ARMD age related macular degeneration; POAG primary open angle glaucoma; EBMD epithelial/anterior basement membrane dystrophy; ACIOL anterior chamber intraocular lens; IOL intraocular lens; PCIOL posterior chamber intraocular lens; Phaco/IOL phacoemulsification with intraocular lens placement; [place] photorefractive keratectomy; LASIK laser assisted in situ keratomileusis; HTN hypertension; DM diabetes mellitus; COPD chronic obstructive pulmonary disease

## 2019-09-11 NOTE — Patient Instructions (Signed)
Patient instructed to return promptly for new visual acuity decline, or visual acuity distortions

## 2019-09-19 ENCOUNTER — Other Ambulatory Visit: Payer: Self-pay | Admitting: Hematology and Oncology

## 2019-09-20 ENCOUNTER — Ambulatory Visit (INDEPENDENT_AMBULATORY_CARE_PROVIDER_SITE_OTHER): Payer: Medicare Other | Admitting: *Deleted

## 2019-09-20 ENCOUNTER — Other Ambulatory Visit: Payer: Self-pay | Admitting: *Deleted

## 2019-09-20 ENCOUNTER — Other Ambulatory Visit: Payer: Self-pay

## 2019-09-20 DIAGNOSIS — M545 Low back pain, unspecified: Secondary | ICD-10-CM

## 2019-09-20 DIAGNOSIS — M810 Age-related osteoporosis without current pathological fracture: Secondary | ICD-10-CM | POA: Diagnosis not present

## 2019-09-20 MED ORDER — DENOSUMAB 60 MG/ML ~~LOC~~ SOSY
60.0000 mg | PREFILLED_SYRINGE | Freq: Once | SUBCUTANEOUS | Status: AC
  Administered 2019-09-20: 60 mg via SUBCUTANEOUS

## 2019-09-20 MED ORDER — FENTANYL 12 MCG/HR TD PT72: MEDICATED_PATCH | TRANSDERMAL | 0 refills | Status: DC

## 2019-09-20 NOTE — Telephone Encounter (Signed)
Patient requested refill Epic LR: 08/23/2019 Pended Rx and sent to Dr. Mariea Clonts for approval.

## 2019-09-25 ENCOUNTER — Other Ambulatory Visit: Payer: Self-pay

## 2019-09-25 ENCOUNTER — Encounter: Payer: Self-pay | Admitting: Hematology and Oncology

## 2019-09-25 ENCOUNTER — Inpatient Hospital Stay: Payer: Medicare Other | Attending: Hematology and Oncology | Admitting: Hematology and Oncology

## 2019-09-25 ENCOUNTER — Inpatient Hospital Stay: Payer: Medicare Other

## 2019-09-25 DIAGNOSIS — D45 Polycythemia vera: Secondary | ICD-10-CM | POA: Insufficient documentation

## 2019-09-25 LAB — CBC WITH DIFFERENTIAL/PLATELET
Abs Immature Granulocytes: 0.05 10*3/uL (ref 0.00–0.07)
Basophils Absolute: 0 10*3/uL (ref 0.0–0.1)
Basophils Relative: 0 %
Eosinophils Absolute: 0.1 10*3/uL (ref 0.0–0.5)
Eosinophils Relative: 1 %
HCT: 37.8 % (ref 36.0–46.0)
Hemoglobin: 12.4 g/dL (ref 12.0–15.0)
Immature Granulocytes: 1 %
Lymphocytes Relative: 11 %
Lymphs Abs: 0.8 10*3/uL (ref 0.7–4.0)
MCH: 38.3 pg — ABNORMAL HIGH (ref 26.0–34.0)
MCHC: 32.8 g/dL (ref 30.0–36.0)
MCV: 116.7 fL — ABNORMAL HIGH (ref 80.0–100.0)
Monocytes Absolute: 0.6 10*3/uL (ref 0.1–1.0)
Monocytes Relative: 8 %
Neutro Abs: 6 10*3/uL (ref 1.7–7.7)
Neutrophils Relative %: 79 %
Platelets: 372 10*3/uL (ref 150–400)
RBC: 3.24 MIL/uL — ABNORMAL LOW (ref 3.87–5.11)
RDW: 16.9 % — ABNORMAL HIGH (ref 11.5–15.5)
WBC: 7.6 10*3/uL (ref 4.0–10.5)
nRBC: 0 % (ref 0.0–0.2)

## 2019-09-25 MED ORDER — HYDROXYUREA 500 MG PO CAPS: ORAL_CAPSULE | ORAL | 3 refills | Status: AC

## 2019-09-25 NOTE — Progress Notes (Signed)
[facility] OFFICE PROGRESS NOTE  Patient Care Team: Gayland Curry, DO as PCP - General (Geriatric Medicine) Larey Dresser, MD as PCP - Cardiology (Cardiology) [facility], Clent Demark, MD as Consulting Physician (Ophthalmology) Larey Dresser, MD as Consulting Physician (Cardiology) Heath Lark, MD as Consulting Physician (Hematology and Oncology) Clent Jacks, MD as Consulting Physician (Ophthalmology) Jerrell Belfast, MD as Consulting Physician (Otolaryngology)  ASSESSMENT & PLAN:  Polycythemia vera With recent dose adjustment, her platelet count has improved In fact, her CBC today is normal The patient stated she has been compliant taking medications as directed For now, I do not plan to adjust the dose of hydroxyurea, and she will continue taking 2 capsules daily from Mondays to Fridays and 1 capsule daily on Saturdays and Sunday I plan to see her again in 3 months for further follow-up   No orders of the defined types were placed in this encounter.   All questions were answered. The patient knows to call the clinic with any problems, questions or concerns. The total time spent in the appointment was 15 minutes encounter with patients including review of chart and various tests results, discussions about plan of care and coordination of care plan   Heath Lark, MD 09/25/2019 5:06 PM  INTERVAL HISTORY: Please see below for problem oriented charting. She returns for further follow-up She is doing very well No recent infection Denies bleeding She is compliant taking her medications as directed  SUMMARY OF ONCOLOGIC HISTORY: Oncology History  Polycythemia vera ([place])  08/05/2011 Pathology Results   Peripheral blood JAK2 mutation was positive with low serum erythropoietin level. Bone marrow aspirate and biopsy was not performed.   08/12/2011 -  Chemotherapy   She is started on hydroxyurea with periodic phlebotomy.     REVIEW  OF SYSTEMS:   Constitutional: Denies fevers, chills or abnormal weight loss Eyes: Denies blurriness of vision Ears, nose, mouth, throat, and face: Denies mucositis or sore throat Respiratory: Denies cough, dyspnea or wheezes Cardiovascular: Denies palpitation, chest discomfort or lower extremity swelling Gastrointestinal:  Denies nausea, heartburn or change in bowel habits Skin: Denies abnormal skin rashes Lymphatics: Denies new lymphadenopathy or easy bruising Neurological:Denies numbness, tingling or new weaknesses Behavioral/Psych: Mood is stable, no new changes  All other systems were reviewed with the patient and are negative.  I have reviewed the past medical history, past surgical history, social history and family history with the patient and they are unchanged from previous note.  ALLERGIES:  is allergic to akten [lidocaine], amlodipine, and augmentin [amoxicillin-pot clavulanate].  MEDICATIONS:  Current Outpatient Medications  Medication Sig Dispense Refill  . acetaminophen (TYLENOL) 325 MG tablet Take 650 mg by mouth every 4 (four) hours as needed.    [name] Kitchen apixaban (ELIQUIS) 2.5 MG TABS tablet Take 1 tablet (2.5 mg total) by mouth 2 (two) times daily. 180 tablet 3  . fentaNYL (DURAGESIC) 12 MCG/HR Place one patch onto the skin every 3 days. Remove old patch 10 patch 0  . gabapentin (NEURONTIN) 100 MG capsule Take 100 mg by mouth at bedtime.    . hydrALAZINE (APRESOLINE) 50 MG tablet TAKE 1 1/2 TABLETS THREE TIMES DAILY. 135 tablet 3  . hydroxyurea (HYDREA) 500 MG capsule 2 capsules daily from Mondays to Fridays and 1 capsule daily on Saturdays and Sunday 100 capsule 3  . levothyroxine (SYNTHROID) 100 MCG tablet TAKE 1 TABLET ONCE DAILY BEFORE BREAKFAST. 90 tablet 0  . Magnesium 250 MG TABS Take 250  mg by mouth daily.     . Melatonin 10 MG TABS Take 10 mg by mouth at bedtime.    . metoprolol succinate (TOPROL-XL) 25 MG 24 hr tablet Take 1 tablet (25 mg total) by mouth 2 (two)  times daily. 60 tablet 6  . Multiple Vitamins-Minerals (PRESERVISION AREDS 2) CAPS Take 1 capsule by mouth 2 (two) times daily.     . sennosides-docusate sodium (SENOKOT-S) 8.6-50 MG tablet Take 2 tablets by mouth 2 (two) times daily.    . sodium chloride (OCEAN) 0.65 % SOLN nasal spray Place 2 sprays into both nostrils 2 (two) times daily.     [name] Kitchen torsemide (DEMADEX) 20 MG tablet Take 4 tablets (80 mg total) by mouth in the morning AND 2 tablets (40 mg total) every evening. 180 tablet 3  . traMADol (ULTRAM) 50 MG tablet TAKE ONE TABLET AT BEDTIME. 30 tablet 0   No current facility-administered medications for this visit.    PHYSICAL EXAMINATION: ECOG PERFORMANCE STATUS: 0 - Asymptomatic  Vitals:   09/25/19 1340  BP: 116/81  Pulse: 79  Resp: 18  Temp: 98.3 F (36.8 C)  SpO2: 100%   Filed Weights   09/25/19 1340  Weight: 106 lb (48.1 kg)    GENERAL:alert, no distress and comfortable NEURO: alert & oriented x 3 with fluent speech, no focal motor/sensory deficits  LABORATORY DATA:  I have reviewed the data as listed    Component Value Date/Time   NA 137 08/20/2019 1115   NA 136 (A) 08/10/2017 0000   NA 136 09/14/2016 1428   K 3.9 08/20/2019 1115   K 4.0 09/14/2016 1428   CL 97 (L) 08/20/2019 1115   CO2 29 08/20/2019 1115   CO2 30 (H) 09/14/2016 1428   GLUCOSE 83 08/20/2019 1115   GLUCOSE 112 09/14/2016 1428   BUN 37 (H) 08/20/2019 1115   BUN 20 08/10/2017 0000   BUN 14.7 09/14/2016 1428   CREATININE 1.16 (H) 08/20/2019 1115   CREATININE 0.8 09/14/2016 1428   CALCIUM 10.5 (H) 08/20/2019 1115   CALCIUM 11.0 (H) 09/14/2016 1428   PROT 6.6 06/19/2019 1435   PROT 7.0 09/14/2016 1428   ALBUMIN 4.2 06/19/2019 1435   ALBUMIN 4.6 09/14/2016 1428   AST 17 06/19/2019 1435   AST 17 09/14/2016 1428   ALT 19 06/19/2019 1435   ALT 18 09/14/2016 1428   ALKPHOS 48 06/19/2019 1435   ALKPHOS 62 09/14/2016 1428   BILITOT 1.1 06/19/2019 1435   BILITOT 1.00 09/14/2016 1428    GFRNONAA 42 (L) 08/20/2019 1115   GFRAA 49 (L) 08/20/2019 1115    No results found for: SPEP, UPEP  Lab Results  Component Value Date   WBC 7.6 09/25/2019   NEUTROABS 6.0 09/25/2019   HGB 12.4 09/25/2019   HCT 37.8 09/25/2019   MCV 116.7 (H) 09/25/2019   PLT 372 09/25/2019      Chemistry      Component Value Date/Time   NA 137 08/20/2019 1115   NA 136 (A) 08/10/2017 0000   NA 136 09/14/2016 1428   K 3.9 08/20/2019 1115   K 4.0 09/14/2016 1428   CL 97 (L) 08/20/2019 1115   CO2 29 08/20/2019 1115   CO2 30 (H) 09/14/2016 1428   BUN 37 (H) 08/20/2019 1115   BUN 20 08/10/2017 0000   BUN 14.7 09/14/2016 1428   CREATININE 1.16 (H) 08/20/2019 1115   CREATININE 0.8 09/14/2016 1428   GLU 76 08/10/2017 0000  Component Value Date/Time   CALCIUM 10.5 (H) 08/20/2019 1115   CALCIUM 11.0 (H) 09/14/2016 1428   ALKPHOS 48 06/19/2019 1435   ALKPHOS 62 09/14/2016 1428   AST 17 06/19/2019 1435   AST 17 09/14/2016 1428   ALT 19 06/19/2019 1435   ALT 18 09/14/2016 1428   BILITOT 1.1 06/19/2019 1435   BILITOT 1.00 09/14/2016 1428

## 2019-09-25 NOTE — Assessment & Plan Note (Signed)
With recent dose adjustment, her platelet count has improved In fact, her CBC today is normal The patient stated she has been compliant taking medications as directed For now, I do not plan to adjust the dose of hydroxyurea, and she will continue taking 2 capsules daily from Mondays to Fridays and 1 capsule daily on Saturdays and Sunday I plan to see her again in 3 months for further follow-up

## 2019-09-26 DIAGNOSIS — H3562 Retinal hemorrhage, left eye: Secondary | ICD-10-CM | POA: Diagnosis not present

## 2019-09-27 ENCOUNTER — Other Ambulatory Visit: Payer: Self-pay

## 2019-09-27 ENCOUNTER — Ambulatory Visit (INDEPENDENT_AMBULATORY_CARE_PROVIDER_SITE_OTHER): Payer: Medicare Other | Admitting: Ophthalmology

## 2019-09-27 ENCOUNTER — Encounter (INDEPENDENT_AMBULATORY_CARE_PROVIDER_SITE_OTHER): Payer: Self-pay | Admitting: Ophthalmology

## 2019-09-27 DIAGNOSIS — H3562 Retinal hemorrhage, left eye: Secondary | ICD-10-CM | POA: Insufficient documentation

## 2019-09-27 DIAGNOSIS — H353221 Exudative age-related macular degeneration, left eye, with active choroidal neovascularization: Secondary | ICD-10-CM | POA: Diagnosis not present

## 2019-09-27 DIAGNOSIS — H353211 Exudative age-related macular degeneration, right eye, with active choroidal neovascularization: Secondary | ICD-10-CM | POA: Diagnosis not present

## 2019-09-27 MED ORDER — BEVACIZUMAB CHEMO INJECTION 1.25MG/0.05ML SYRINGE FOR KALEIDOSCOPE
1.2500 mg | INTRAVITREAL | Status: AC | PRN
  Administered 2019-09-27: 1.25 mg via INTRAVITREAL

## 2019-09-27 NOTE — Progress Notes (Signed)
09/27/2019     CHIEF COMPLAINT Patient presents for Retina Evaluation   HISTORY OF PRESENT ILLNESS: Sarah Mays is a 86 y.o. female who presents to the clinic today for:   HPI    Retina Evaluation    In left eye.  This started 2 days ago.  Duration of 2 days.  Associated Symptoms Blind Spot.  Context:  distance vision, mid-range vision and near vision.  Treatments tried include no treatments.          Comments    WIP - Mac heme OS - Ref'd by Dr. Clent Jacks  Pt c/o blurry spot centrally OS x 2 days. Pt sts [facility] OD stable.         Last edited by Hurman Horn, MD on 09/27/2019  1:44 PM. (History)      Referring physician: Clent Jacks, MD [address] [place],  [place] [id number]  HISTORICAL INFORMATION:   Selected notes from the MEDICAL RECORD NUMBER       CURRENT MEDICATIONS: No current outpatient medications on file. (Ophthalmic Drugs)   No current facility-administered medications for this visit. (Ophthalmic Drugs)   Current Outpatient Medications (Other)  Medication Sig   acetaminophen (TYLENOL) 325 MG tablet Take 650 mg by mouth every 4 (four) hours as needed.   apixaban (ELIQUIS) 2.5 MG TABS tablet Take 1 tablet (2.5 mg total) by mouth 2 (two) times daily.   fentaNYL (DURAGESIC) 12 MCG/HR Place one patch onto the skin every 3 days. Remove old patch   gabapentin (NEURONTIN) 100 MG capsule Take 100 mg by mouth at bedtime.   hydrALAZINE (APRESOLINE) 50 MG tablet TAKE 1 1/2 TABLETS THREE TIMES DAILY.   hydroxyurea (HYDREA) 500 MG capsule 2 capsules daily from Mondays to Fridays and 1 capsule daily on Saturdays and Sunday   levothyroxine (SYNTHROID) 100 MCG tablet TAKE 1 TABLET ONCE DAILY BEFORE BREAKFAST.   Magnesium 250 MG TABS Take 250 mg by mouth daily.    Melatonin 10 MG TABS Take 10 mg by mouth at bedtime.   metoprolol succinate (TOPROL-XL) 25 MG 24 hr tablet Take 1 tablet (25 mg total) by mouth 2 (two) times daily.   Multiple  Vitamins-Minerals (PRESERVISION AREDS 2) CAPS Take 1 capsule by mouth 2 (two) times daily.    sennosides-docusate sodium (SENOKOT-S) 8.6-50 MG tablet Take 2 tablets by mouth 2 (two) times daily.   sodium chloride (OCEAN) 0.65 % SOLN nasal spray Place 2 sprays into both nostrils 2 (two) times daily.    torsemide (DEMADEX) 20 MG tablet Take 4 tablets (80 mg total) by mouth in the morning AND 2 tablets (40 mg total) every evening.   traMADol (ULTRAM) 50 MG tablet TAKE ONE TABLET AT BEDTIME.   No current facility-administered medications for this visit. (Other)      REVIEW OF SYSTEMS:    ALLERGIES Allergies  Allergen Reactions   Akten [Lidocaine]    Amlodipine Swelling and Other (See Comments)    Lower extremity swelling    Augmentin [Amoxicillin-Pot Clavulanate] Other (See Comments)    Stomach Pain/cramps Did it involve swelling of the face/tongue/throat, SOB, or low BP? No Did it involve sudden or severe rash/hives, skin peeling, or any reaction on the inside of your mouth or nose? No Did you need to seek medical attention at a hospital or doctor's office? Unknown When did it last happen?unknown If all above answers are "NO", may proceed with cephalosporin use.     PAST MEDICAL  HISTORY Past Medical History:  Diagnosis Date   Abnormal stress test    a. 11/2011 Ex MV: EF 80%, small, partially reversible anteroapical defect->mild ischemia vs attenuation-->med Rx.   Alopecia, unspecified    Arthritis    Chronic diastolic CHF (congestive heart failure) ([place])    a. 11/2013 Echo: EF 60-65%, no rwma, mild TR, PASP 73mHg.   Closed fracture of lower end of radius with ulna    Deafness    Left, s/p multiple surgeries   Essential hypertension    Gouty arthropathy, unspecified    History of kidney stones    Hypothyroidism    Insomnia, unspecified    Macular degeneration    Left, s/p inj. tx   Mitral valve disorders(424.0)    Neck mass    right, w/u  including MRI negative   PAF ([place]oxysmal atrial fibrillation) ([place]    a. on amio (02/2012 mild obstruction on PFT's) and xarelto.   Polycythemia vera(238.4)    Pure hypercholesterolemia    Senile osteoporosis    Reclast in the past   Tricuspid valve disorders, specified as nonrheumatic    Past Surgical History:  Procedure Laterality Date   BREAST BIOPSY  06/18/1998   left   CARDIOVERSION N/A 07/15/2017   Procedure: CARDIOVERSION;  Surgeon: MLarey Dresser MD;  [facility];  Service: Cardiovascular;  Laterality: N/A;   CARDIOVERSION N/A 07/21/2018   Procedure: CARDIOVERSION;  Surgeon: MLarey Dresser MD;  [facility];  Service: Cardiovascular;  Laterality: N/A;   CARDIOVERSION N/A 10/06/2018   Procedure: CARDIOVERSION;  Surgeon: MLarey Dresser MD;  [facility];  Service: Cardiovascular;  Laterality: N/A;   ELECTROPHYSIOLOGIC STUDY N/A 01/08/2016   Procedure: Atrial Fibrillation Ablation;  Surgeon: JThompson Grayer MD;  [facility];  Service: Cardiovascular;  Laterality: N/A;   FEMUR IM NAIL Right 10/08/2015   Procedure: INTRAMEDULLARY (IM) NAIL FEMORAL RIGHT;  Surgeon: MParalee Cancel MD;  Location[facility];  Service: Orthopedics;  Laterality: Right;   MASS EXCISION Right 11/26/2016   Procedure: EXCISION RIGHT LATERAL NECK MASS;  Surgeon: SJerrell Belfast MD;  [facility]  Service: ENT;  Laterality: Right;   TEE WITHOUT CARDIOVERSION N/A 07/15/2017   Procedure: TRANSESOPHAGEAL ECHOCARDIOGRAM (TEE);  Surgeon: MLarey Dresser MD;  [facility];  Service: Cardiovascular;  Laterality: N/A;   TONSILLECTOMY     TOTAL ABDOMINAL HYSTERECTOMY     TYMPANOPLASTY Bilateral     FAMILY HISTORY Family History  Problem Relation Age of Onset   Hypertension Father    Heart disease Father        patient does not know details.    Ovarian cancer Mother 667  Cancer Sister        colon   Heart disease Sister     Cancer Sister    Cancer Sister        hodgkin disease   Breast cancer Daughter    Cancer Daughter        breast    SOCIAL HISTORY Social History   Tobacco Use   Smoking status: Former Smoker    Types: Cigarettes    Quit date: 01/19/1984    Years since quitting: 35.7   Smokeless tobacco: Never Used  Vaping Use   Vaping Use: Never used  Substance Use Topics   Alcohol use: Yes    Alcohol/week: 1.0 standard drink    Types: 1 Glasses of wine per week    Comment: Every evening  Drug use: No         OPHTHALMIC EXAM: Base Eye Exam    Visual Acuity (ETDRS)      Right Left   Dist cc 20/40 CF @ 2'   Dist ph cc NI NI   Correction: Glasses       Tonometry (Tonopen, 12:58 PM)      Right Left   Pressure 15 13       Pupils      Pupils Dark Light Shape React APD   Right PERRL 4 3 Round Brisk None   Left PERRL 4 3 Round Brisk None       Visual Fields (Counting fingers)      Left Right    Full Full       Extraocular Movement      Right Left    Full Full       Neuro/Psych    Oriented x3: Yes   Mood/Affect: Normal       Dilation    Left eye: 1.0% Mydriacyl, 2.5% Phenylephrine @ 1:01 PM        Slit Lamp and Fundus Exam    External Exam      Right Left   External Normal Normal       Slit Lamp Exam      Right Left   Lids/Lashes Normal Normal   Conjunctiva/Sclera White and quiet White and quiet   Cornea Clear Clear   Anterior Chamber Deep and quiet Deep and quiet   Iris Round and reactive Round and reactive   Lens Posterior chamber intraocular lens  sutured Posterior chamber intraocular lens   Anterior Vitreous Normal vitrectomized       Fundus Exam      Right Left   Posterior Vitreous  vitrectomized   Disc  Normal   C/D Ratio  0.45   Macula  Disciform scar, inferior and peripheral to disciform scar is subretinal hemorrhage, which is approximately 3 disc diameters from the center of the foveal region and poses no risk  Centrally there is  a scar with a subretinal reddish hue in the foveal region, old CNVM or normal OD choroidal vessel          IMAGING AND PROCEDURES  Imaging and Procedures for 09/27/19  OCT, Retina - OU - Both Eyes       Right Eye Quality was good. Scan locations included subfoveal. Central Foveal Thickness: 194. Progression has been stable. Findings include subretinal hyper-reflective material, intraretinal hyper-reflective material, central retinal atrophy, outer retinal atrophy.   Left Eye Quality was good. Scan locations included subfoveal. Central Foveal Thickness: 343. Progression has improved. Findings include outer retinal atrophy, inner retinal atrophy.   Notes OD with ongoing persistent subretinal atrophy, subretinal hyperintense material with major disruption of the outer retinal structures no signs of CNVM  OS no interval change over the last 2 weeks with chronic disciform scar subfoveal and inferotemporal no intraretinal or subretinal fluid       Color Fundus Photography Optos - OU - Both Eyes       Right Eye Progression has been stable. Disc findings include normal observations. Macula : drusen, geographic atrophy.   Left Eye Progression has worsened. Disc findings include normal observations. Macula : drusen, geographic atrophy, hemorrhage. Vessels : normal observations. Periphery : normal observations.   Notes OD with extensive soft and hard drusen with areas of extensive geographic atrophy in a parafoveal region.  Small island of foveal pigment remains accounting for  the acuity.  No signs of active CNVM, OD.  Vastly improved from January 2021  OS, disciform macular scar inferotemporal with central geographic atrophy OS.  There is a new small area of subretinal fluid and hemorrhage approximately 2-1/2 cm diameters of the fovea inferotemporal.  Large region could represent an active lesion growth in addition to her use of Eliquis poses a risk for substantial subretinal hemorrhage  progression.   we will thus commence with intravitreal Avastin likely on a once quarterly basis initially                ASSESSMENT/PLAN:  Exudative age-related macular degeneration of left eye with active choroidal neovascularization ([place]) This new lesion is inferotemporal to the actual foveal region and is in fact temporal and inferior to a large  old disciform scar.  This does suggest an area of activity which could pose a risk for massive expansion since the patient is on blood thinners   Plan will be to initiate intravitreal Avastin OS today and examination in the left eye monthly or so conjunction with follow-up of the right eye.  Likely this will need once quarterly injection and perhaps 1 day will need to cease its use      ICD-10-CM   1. Exudative age-related macular degeneration of left eye with active choroidal neovascularization (HCC)  H35.3221 Color Fundus Photography Optos - OU - Both Eyes  2. Exudative age-related macular degeneration of right eye with active choroidal neovascularization (HCC)  H35.3211 OCT, Retina - OU - Both Eyes  3. Retinal hemorrhage, left  H35.62 Color Fundus Photography Optos - OU - Both Eyes    1.  We will commence intravitreal Avastin OS today and and quarterly examinations of left eye  2.  OD as scheduled with examination and possible intravitreal Avastin to maintain current visual acuity  3.  Ophthalmic Meds Ordered this visit:  No orders of the defined types were placed in this encounter.      Return in about 3 months (around 12/28/2019) for dilate, OS, AVASTIN OCT.  There are no Patient Instructions on file for this visit.   Explained the diagnoses, plan, and follow up with the patient and they expressed understanding.  Patient expressed understanding of the importance of proper follow up care.   Clent Demark Geeta Dworkin M.D. Diseases & Surgery of the Retina and Vitreous Retina & Diabetic [place] 09/27/19     Abbreviations: M  myopia (nearsighted); A astigmatism; H hyperopia (farsighted); P presbyopia; Mrx spectacle prescription;  CTL contact lenses; OD right eye; OS left eye; OU both eyes  XT exotropia; ET esotropia; PEK punctate epithelial keratitis; PEE punctate epithelial erosions; DES dry eye syndrome; MGD meibomian gland dysfunction; ATs artificial tears; PFAT's preservative free artificial tears; [place] nuclear sclerotic cataract; PSC posterior subcapsular cataract; ERM epi-retinal membrane; PVD posterior vitreous detachment; RD retinal detachment; DM diabetes mellitus; DR diabetic retinopathy; NPDR non-proliferative diabetic retinopathy; PDR proliferative diabetic retinopathy; CSME clinically significant macular edema; DME diabetic macular edema; dbh dot blot hemorrhages; CWS cotton wool spot; POAG primary open angle glaucoma; C/D cup-to-disc ratio; HVF humphrey visual field; GVF goldmann visual field; OCT optical coherence tomography; IOP intraocular pressure; BRVO Branch retinal vein occlusion; CRVO central retinal vein occlusion; CRAO central retinal artery occlusion; BRAO branch retinal artery occlusion; RT retinal tear; SB scleral buckle; PPV pars plana vitrectomy; VH Vitreous hemorrhage; PRP panretinal laser photocoagulation; IVK intravitreal kenalog; VMT vitreomacular traction; MH Macular hole;  NVD neovascularization of the disc; NVE neovascularization elsewhere; AREDS  age related eye disease study; ARMD age related macular degeneration; POAG primary open angle glaucoma; EBMD epithelial/anterior basement membrane dystrophy; ACIOL anterior chamber intraocular lens; IOL intraocular lens; PCIOL posterior chamber intraocular lens; Phaco/IOL phacoemulsification with intraocular lens placement; [place] photorefractive keratectomy; LASIK laser assisted in situ keratomileusis; HTN hypertension; DM diabetes mellitus; COPD chronic obstructive pulmonary disease

## 2019-09-27 NOTE — Assessment & Plan Note (Signed)
This new lesion is inferotemporal to the actual foveal region and is in fact temporal and inferior to a large  old disciform scar.  This does suggest an area of activity which could pose a risk for massive expansion since the patient is on blood thinners   Plan will be to initiate intravitreal Avastin OS today and examination in the left eye monthly or so conjunction with follow-up of the right eye.  Likely this will need once quarterly injection and perhaps 1 day will need to cease its use

## 2019-10-03 ENCOUNTER — Other Ambulatory Visit: Payer: Self-pay | Admitting: Internal Medicine

## 2019-10-03 DIAGNOSIS — M545 Low back pain, unspecified: Secondary | ICD-10-CM

## 2019-10-03 NOTE — Telephone Encounter (Signed)
Contract done 02/2019.  Last refill 08/29/19  Last OV 08/15/19

## 2019-10-05 ENCOUNTER — Other Ambulatory Visit: Payer: Self-pay

## 2019-10-05 ENCOUNTER — Ambulatory Visit (HOSPITAL_COMMUNITY)
Admission: RE | Admit: 2019-10-05 | Discharge: 2019-10-05 | Disposition: A | Discharge status: Home / Self Care | Payer: Medicare Other | Source: Ambulatory Visit | Attending: Cardiology | Admitting: Cardiology

## 2019-10-05 VITALS — BP 105/65 | HR 63 | Wt 106.2 lb

## 2019-10-05 DIAGNOSIS — Z87891 Personal history of nicotine dependence: Secondary | ICD-10-CM | POA: Diagnosis not present

## 2019-10-05 DIAGNOSIS — E039 Hypothyroidism, unspecified: Secondary | ICD-10-CM | POA: Diagnosis not present

## 2019-10-05 DIAGNOSIS — I110 Hypertensive heart disease with heart failure: Secondary | ICD-10-CM | POA: Insufficient documentation

## 2019-10-05 DIAGNOSIS — Z79890 Hormone replacement therapy (postmenopausal): Secondary | ICD-10-CM | POA: Diagnosis not present

## 2019-10-05 DIAGNOSIS — I340 Nonrheumatic mitral (valve) insufficiency: Secondary | ICD-10-CM | POA: Diagnosis not present

## 2019-10-05 DIAGNOSIS — I5032 Chronic diastolic (congestive) heart failure: Secondary | ICD-10-CM | POA: Insufficient documentation

## 2019-10-05 DIAGNOSIS — Z7901 Long term (current) use of anticoagulants: Secondary | ICD-10-CM | POA: Diagnosis not present

## 2019-10-05 DIAGNOSIS — Z888 Allergy status to other drugs, medicaments and biological substances status: Secondary | ICD-10-CM | POA: Diagnosis not present

## 2019-10-05 DIAGNOSIS — I495 Sick sinus syndrome: Secondary | ICD-10-CM | POA: Insufficient documentation

## 2019-10-05 DIAGNOSIS — Z79899 Other long term (current) drug therapy: Secondary | ICD-10-CM | POA: Insufficient documentation

## 2019-10-05 DIAGNOSIS — Z8249 Family history of ischemic heart disease and other diseases of the circulatory system: Secondary | ICD-10-CM | POA: Insufficient documentation

## 2019-10-05 DIAGNOSIS — I482 Chronic atrial fibrillation, unspecified: Secondary | ICD-10-CM | POA: Diagnosis not present

## 2019-10-05 DIAGNOSIS — Z79891 Long term (current) use of opiate analgesic: Secondary | ICD-10-CM | POA: Diagnosis not present

## 2019-10-05 LAB — BASIC METABOLIC PANEL
Anion gap: 10 (ref 5–15)
BUN: 40 mg/dL — ABNORMAL HIGH (ref 8–23)
CO2: 31 mmol/L (ref 22–32)
Calcium: 10.3 mg/dL (ref 8.9–10.3)
Chloride: 96 mmol/L — ABNORMAL LOW (ref 98–111)
Creatinine, Ser: 1.22 mg/dL — ABNORMAL HIGH (ref 0.44–1.00)
GFR calc Af Amer: 46 mL/min — ABNORMAL LOW (ref >60)
GFR calc non Af Amer: 40 mL/min — ABNORMAL LOW (ref >60)
Glucose, Bld: 80 mg/dL (ref 70–99)
Potassium: 3.6 mmol/L (ref 3.5–5.1)
Sodium: 137 mmol/L (ref 135–145)

## 2019-10-05 LAB — CBC
HCT: 34.3 % — ABNORMAL LOW (ref 36.0–46.0)
Hemoglobin: 11.3 g/dL — ABNORMAL LOW (ref 12.0–15.0)
MCH: 39.4 pg — ABNORMAL HIGH (ref 26.0–34.0)
MCHC: 32.9 g/dL (ref 30.0–36.0)
MCV: 119.5 fL — ABNORMAL HIGH (ref 80.0–100.0)
Platelets: 298 10*3/uL (ref 150–400)
RBC: 2.87 MIL/uL — ABNORMAL LOW (ref 3.87–5.11)
RDW: 16.6 % — ABNORMAL HIGH (ref 11.5–15.5)
WBC: 6 10*3/uL (ref 4.0–10.5)
nRBC: 0 % (ref 0.0–0.2)

## 2019-10-05 NOTE — Patient Instructions (Addendum)
Labs done today, your results will be available in MyChart, we will contact you for abnormal readings.   Your physician recommends that you follow up with our office in 3 months with an echocardiogram  Your physician has requested that you have an echocardiogram. Echocardiography is a painless test that uses sound waves to create images of your heart. It provides your doctor with information about the size and shape of your heart and how well your heart's chambers and valves are working. This procedure takes approximately one hour. There are no restrictions for this procedure.   If you have any questions or concerns before your next appointment please send [place] a message through [place] or call our office at [phone].    TO LEAVE A MESSAGE FOR THE NURSE SELECT OPTION 2, PLEASE LEAVE A MESSAGE INCLUDING: . YOUR NAME . DATE OF BIRTH . CALL BACK NUMBER . REASON FOR CALL**this is important as we prioritize the call backs  [place] AS LONG AS YOU CALL BEFORE 4:00 PM   At the [facility], you and your health needs are our priority. As part of our continuing mission to provide you with exceptional heart care, we have created designated Provider Care Teams. These Care Teams include your primary Cardiologist (physician) and Advanced Practice Providers (APPs- Physician Assistants and Nurse Practitioners) who all work together to provide you with the care you need, when you need it.   You may see any of the following providers on your designated Care Team at your next follow up: [name] Kitchen Dr Glori Bickers . Dr Loralie Champagne . Darrick Grinder, NP . Lyda Jester, PA . Audry Riles, PharmD   Please be sure to bring in all your medications bottles to every appointment.

## 2019-10-07 NOTE — Progress Notes (Signed)
ID:  Sarah Mays, DOB [birth year], MRN [id number]   Provider location: [facility] Type of Visit: Established patient  PCP:  Gayland Curry, DO  Cardiologist:  Loralie Champagne, MD   History of Present Illness: Sarah Mays is a 86 y.o. female who has a history of paroxysmal atrial fibrillation and chronic diastolic CHF.  In 7/16, she was admitted with chest pain.  Cardiolite showed no ischemia or infarction.  Echo in 6/17 showed EF 60-65%.  She developed problems elevated HR when in atrial fibrillation but bradycardia when in NSR.  She underwent atrial fibrillation ablation in 11/17.  She is tolerating Xarelto without melena or BRBPR.    Echo in 11/18 showed EF 60-65%, PASP 47 mmHg.   TEE-guided DCCV back to NSR in 6/19.  TEE showed EF 55-60% with moderate-severe MR.   She developed multiple episodes of epistaxis with Xarelto use.  We have transitioned her to apixaban 2.5 mg bid and she has not had epistaxis since then.   Echo in 6/20 showed EF 60-65%, normal RV, mild-moderate MR.   She had recurrent atrial fibrillation with DCCV to NSR in 6/20.  She then had atypical flutter with DCCV to NSR in 8/20. Symptomatically worse when in atrial fibrillation/flutter. At a prior visit, she was in junctional rhythm. Zio patch x 3 days in 9/20 showed 51% atrial flutter with controlled rate. She saw Dr. Rayann Heman and they decided against repeat ablation. She has been in atrial fibrillation and flutter chronically since then, amiodarone was stopped since it did not keep her NSR.  She returns for followup of CHF and atrial fibrillation/flutter.  She is stable clinically.  Able to walk into the office today with her walker without dyspnea.  Dyspnea with longer distances.  No chest pain.  No lightheadedness.  No orthopnea/PND.  Weight is down 2 lbs.    Labs (7/16): K 4, creatinine 0.94, calcium 10.8, LFTs normal Labs (9/16): K 4, creatinine 0.9, LDL 69, TSH normal, HCT  42.3 Labs (5/17): K 4.6, creatinine 0.94, HCT 41.5, LFTs normal. Labs (6/17): K 5, creatinine 1.04, LDL 42, HDL 101, TSH normal Labs (12/17): K 3.7, creatinine 0.76, hgb 11.2 Labs (4/18): K 4.6, creatinine 0.91 Labs (10/18): K 4.5, creatinine 0.62 Labs (12/18): hgb 13.5 Labs (1/19): K 4.3, creatinine 0.6 Labs (4/19): hgb 13.5 Labs (7/19): K 4.5, creatinine 0.5, hgb 12.1 Labs (11/19): K 3.7, creatinine 0.95 Labs (12/19): K 3.6, creatinine 0.9, hgb 11.6 Labs (6/20): K 3.7, creatinine 1.01 => 1.17 Labs (9/20): K 4.1, creatinine 1.17 Labs (3/21): K 3.7, creatinine 1.27 Labs (5/21): K 3.8, creatinine 1.4, hgb 10.8, bnp 549 Labs (7/21): K 3.9, creatinine 1.16  PMH: 1. Atrial fibrillation/flutter: Diagnosed initially in 10/13.  Holter monitor in 11/13 showed atrial fibrillation with average rate 65.  - Atrial fibrillation ablation in 11/17.  - TEE-guided DCCV in 6/19.  - atrial fibrillation with DCCV to NSR in 6/20.  - atypical atrial flutter with DCCV to NSR in 8/20.  - Zio patch x 3 days (9/20): 51% atrial flutter - Now chronic 2. Chronic diastolic CHF: Echo (99/83) with EF 65-70%, mild MR, moderate biatrial enlargement, moderate TR, PA systolic pressure 45 mmHg.   Echo (10/15): EF 60-65%.  Echo (7/16) with EF 60-65%, mild MR.  - Echo (6/17): EF 60-65%, PASP 41 mmHg.  - Echo (11/18): EF 60-65%, PASP 47 mmHg - TEE (6/19): EF 55-60%, mild LVH, mildly decreased RV systolic function, peak RV-RA  gradient 40 mmHg, moderate-severe MR with bileaflet MVP, ERO 0.37 cm^2.  - Echo (6/20): EF 60-65%, normal RV, mild-moderate MR, severe biatrial enlargement.  3. ETT-Sestamibi (11/13): Exercised to stage II, small partially reversible anteroapical perfusion defect suggesting mild ischemia versus attenuation, EF 80%.  Cardiolite (7/16) with EF 64%, no ischemia or infarction.  4. HTN: Lower extremity swelling with amlodipine.  5. Polycythemia vera: Has had phlebotomy only once.  6. Hypothyroidism 7.  Macular degeneration 8. TAH 1980 9. Familial hypocalciuric hypercalcemia 10. PFTs (amiodarone use) in 1/14 showed a mild obstructive defect.  11. Degenerative disc disease 12. Sick sinus syndrome: Has not required PPM. Junctional rhythm has been noted in the past.  13. Mitral regurgitation: TEE (6/19) with moderate-severe MR with bileaflet MVP, ERO 0.37 cm^2.  - Echo (6/20) with mild-moderate MR.   Current Outpatient Medications  Medication Sig Dispense Refill  . acetaminophen (TYLENOL) 325 MG tablet Take 650 mg by mouth every 4 (four) hours as needed.    [name] Kitchen apixaban (ELIQUIS) 2.5 MG TABS tablet Take 1 tablet (2.5 mg total) by mouth 2 (two) times daily. 180 tablet 3  . fentaNYL (DURAGESIC) 12 MCG/HR Place one patch onto the skin every 3 days. Remove old patch 10 patch 0  . gabapentin (NEURONTIN) 100 MG capsule Take 100 mg by mouth at bedtime.    . hydrALAZINE (APRESOLINE) 50 MG tablet TAKE 1 1/2 TABLETS THREE TIMES DAILY. 135 tablet 3  . hydroxyurea (HYDREA) 500 MG capsule 2 capsules daily from Mondays to Fridays and 1 capsule daily on Saturdays and Sunday 100 capsule 3  . levothyroxine (SYNTHROID) 100 MCG tablet TAKE 1 TABLET ONCE DAILY BEFORE BREAKFAST. 90 tablet 0  . Magnesium 250 MG TABS Take 250 mg by mouth daily.     . Melatonin 10 MG TABS Take 10 mg by mouth at bedtime.    . metoprolol succinate (TOPROL-XL) 25 MG 24 hr tablet Take 1 tablet (25 mg total) by mouth 2 (two) times daily. 60 tablet 6  . Multiple Vitamins-Minerals (PRESERVISION AREDS 2) CAPS Take 1 capsule by mouth 2 (two) times daily.     . sennosides-docusate sodium (SENOKOT-S) 8.6-50 MG tablet Take 2 tablets by mouth 2 (two) times daily.    . sodium chloride (OCEAN) 0.65 % SOLN nasal spray Place 2 sprays into both nostrils 2 (two) times daily.     [name] Kitchen torsemide (DEMADEX) 20 MG tablet Take 4 tablets (80 mg total) by mouth in the morning AND 2 tablets (40 mg total) every evening. 180 tablet 3  . traMADol (ULTRAM) 50 MG tablet  TAKE ONE TABLET AT BEDTIME. 30 tablet 5   No current facility-administered medications for this encounter.    Allergies:   Akten [lidocaine], Amlodipine, and Augmentin [amoxicillin-pot clavulanate]   Social History:  The patient  reports that she quit smoking about 35 years ago. Her smoking use included cigarettes. She has never used smokeless tobacco. She reports current alcohol use of about 1.0 standard drink of alcohol per week. She reports that she does not use drugs.   Family History:  The patient's family history includes Breast cancer in her daughter; Cancer in her daughter, sister, sister, and sister; Heart disease in her father and sister; Hypertension in her father; Ovarian cancer (age of onset: 22) in her mother.   ROS:  Please see the history of present illness.   All other systems are personally reviewed and negative.   Exam:   BP 105/65   Pulse 63  Wt 48.2 kg (106 lb 3.2 oz)   SpO2 96%   BMI 20.74 kg/m  General: NAD Neck: No JVD, no thyromegaly or thyroid nodule.  Lungs: mild rhonchi at bases.  CV: Nondisplaced PMI.  Heart irregular S1/S2, no S3/S4, no murmur. 1+ right ankle edema.  No carotid bruit.  Normal pedal pulses.  Abdomen: Soft, nontender, no hepatosplenomegaly, no distention.  Skin: Intact without lesions or rashes.  Neurologic: Alert and oriented x 3.  Psych: Normal affect. Extremities: No clubbing or cyanosis.  HEENT: Normal.   Recent Labs: 10/13/2018: Magnesium 2.4; TSH 3.622 06/19/2019: ALT 19; B Natriuretic Peptide 549.0 10/05/2019: BUN 40; Creatinine, Ser 1.22; Hemoglobin 11.3; Platelets 298; Potassium 3.6; Sodium 137  Personally reviewed   Wt Readings from Last 3 Encounters:  10/05/19 48.2 kg (106 lb 3.2 oz)  09/25/19 48.1 kg (106 lb)  09/06/19 48.1 kg (106 lb)      ASSESSMENT AND PLAN:  1. Atrial fibrillation/flutter: First noted in 10/13.  Atrial fibrillation has triggered acute on chronic diastolic CHF in the past.  CHADSVASC score is 91  (age, female gender, HTN, CHF).  She is anticoagulated with apixaban 2.5 mg bid which is appropriate for her age/weight.  She had atrial fibrillation ablation in 11/17.  TEE-guided DCCV in 6/19.  Cardioversion in 6/20 for atrial fibrillation and again in 8/20 for atypical atrial flutter.  Zio patch in 9/20 showed 51% atrial flutter. She saw Dr. Rayann Heman who thought rate-control would probably be the best strategy, recommended against repeat ablation.  She is now off amiodarone and in chronic atrial fibrillation.  - Adequate rate control with Toprol XL 25 mg bid.  - Continue apixaban 2.5 mg bid. CBC today.  2. Chronic diastolic CHF:  Echo was done in 6/20 with EF 60-65%, normal RV, mild-moderate MR.  NYHA class III, stable.  She is not volume overloaded on exam.   - Continue torsemide 80 qam/40 qpm, BMET today.   - Wear compression stockings during the day.   - Repeat echo at followup in 3 months.  3. HTN: BP controlled.    4. Bradycardia: Suspect she has a degree of sick sinus syndrome. Prior junctional rhythm.  No recent bradyarrhythmias.  - Follow rhythm closely on Toprol XL.   5. Mitral regurgitation: Moderate to severe MR in setting of bileaflet prolapse on TEE in 6/19. However, echo in 6/20 showed only mild-moderate MR.   Followup in 3 months with echo  Signed, Loralie Champagne, MD  10/07/2019  [facility] [address] [facility] [place] [address] [phone] (office) [phone] (fax)

## 2019-10-09 ENCOUNTER — Other Ambulatory Visit: Payer: Self-pay | Admitting: Internal Medicine

## 2019-10-11 ENCOUNTER — Encounter (HOSPITAL_COMMUNITY): Payer: Medicare Other

## 2019-10-16 DIAGNOSIS — H60502 Unspecified acute noninfective otitis externa, left ear: Secondary | ICD-10-CM | POA: Diagnosis not present

## 2019-10-18 ENCOUNTER — Encounter: Payer: Self-pay | Admitting: Family Medicine

## 2019-10-18 ENCOUNTER — Other Ambulatory Visit: Payer: Self-pay

## 2019-10-18 ENCOUNTER — Ambulatory Visit (INDEPENDENT_AMBULATORY_CARE_PROVIDER_SITE_OTHER): Payer: Medicare Other | Admitting: Family Medicine

## 2019-10-18 DIAGNOSIS — M1711 Unilateral primary osteoarthritis, right knee: Secondary | ICD-10-CM

## 2019-10-18 NOTE — Assessment & Plan Note (Signed)
Discussed with patient no significant reaccumulation of the Baker's cyst at the moment.  The only thing that would be curative with likely removal.  Patient does not want any surgical intervention.  Otherwise the only other thing we can do is every 10 to 12 weeks consider injections if needed.  Patient feels if she had that done she should do relatively well.

## 2019-10-18 NOTE — Patient Instructions (Signed)
Injected right knee today See me again in 3 months

## 2019-10-18 NOTE — Progress Notes (Signed)
[facility] [address] [place] Phone: [phone] Subjective:   I Sarah Mays am serving as a Education administrator for Dr. Hulan Saas.  This visit occurred during the SARS-CoV-2 public health emergency.  Safety protocols were in place, including screening questions prior to the visit, additional usage of staff PPE, and extensive cleaning of exam room while observing appropriate contact time as indicated for disinfecting solutions.   I'm seeing this patient by the request  of:  Reed, Tiffany L, DO  CC: Knee pain follow-up  [id number]   09/06/2019 Recurrent aspiration done today.  This is a loculated Baker's cyst and unable to remove all the fluid.  Does have some mild normal mild increase Doppler flow surrounding the area but I believe it is secondary to more of the soft tissue expansion.  We discussed advanced imaging in this individual but I do not think it would change medical management.  Patient is to increase activity slowly over the course the next several weeks.  Discussed icing regimen.  Follow-up again 6 to 8 weeks.  Update 10/18/2019 Sarah Mays is a 86 y.o. female coming in with complaint of right knee pain. Patient states the knee is doing ok. Still somewhat painful. Would like injection.  Patient has had the pain for some time.  Not having as much trouble swallowing again.  Patient states that it is giving her more dull throbbing aching pain.  Wanting to know what else she can do to make it more curative without actually having to have true surgery.       Past Medical History:  Diagnosis Date  . Abnormal stress test    a. 11/2011 Ex MV: EF 80%, small, partially reversible anteroapical defect->mild ischemia vs attenuation-->med Rx.  [name] Kitchen Alopecia, unspecified   . Arthritis   . Chronic diastolic CHF (congestive heart failure) ([facility])    a. 11/2013 Echo: EF 60-65%, no rwma, mild TR, PASP 10mmHg.  . Closed fracture of lower end of  radius with ulna   . Deafness    Left, s/p multiple surgeries  . Essential hypertension   . Gouty arthropathy, unspecified   . History of kidney stones   . Hypothyroidism   . Insomnia, unspecified   . Macular degeneration    Left, s/p inj. tx  . Mitral valve disorders(424.0)   . Neck mass    right, w/u including MRI negative  . PAF (paroxysmal atrial fibrillation) ([facility])    a. on amio (02/2012 mild obstruction on PFT's) and xarelto.  . Polycythemia vera(238.4)   . Pure hypercholesterolemia   . Senile osteoporosis    Reclast in the past  . Tricuspid valve disorders, specified as nonrheumatic    Past Surgical History:  Procedure Laterality Date  . BREAST BIOPSY  06/18/1998   left  . CARDIOVERSION N/A 07/15/2017   Procedure: CARDIOVERSION;  Surgeon: Larey Dresser, MD;  Location[facility] ENDOSCOPY;  Service: Cardiovascular;  Laterality: N/A;  . CARDIOVERSION N/A 07/21/2018   Procedure: CARDIOVERSION;  Surgeon: Larey Dresser, MD;  Location: [facility] ENDOSCOPY;  Service: Cardiovascular;  Laterality: N/A;  . CARDIOVERSION N/A 10/06/2018   Procedure: CARDIOVERSION;  Surgeon: Larey Dresser, MD;  Location: [facility] ENDOSCOPY;  Service: Cardiovascular;  Laterality: N/A;  . ELECTROPHYSIOLOGIC STUDY N/A 01/08/2016   Procedure: Atrial Fibrillation Ablation;  Surgeon: Thompson Grayer, MD;  Location: [facility] CV LAB;  Service: Cardiovascular;  Laterality: N/A;  . FEMUR IM NAIL Right 10/08/2015   Procedure: INTRAMEDULLARY (IM)  NAIL FEMORAL RIGHT;  Surgeon: Paralee Cancel, MD;  Location: [facility] ORS;  Service: Orthopedics;  Laterality: Right;  . MASS EXCISION Right 11/26/2016   Procedure: EXCISION RIGHT LATERAL NECK MASS;  Surgeon: Jerrell Belfast, MD;  Location: [facility];  Service: ENT;  Laterality: Right;  . TEE WITHOUT CARDIOVERSION N/A 07/15/2017   Procedure: TRANSESOPHAGEAL ECHOCARDIOGRAM (TEE);  Surgeon: Larey Dresser, MD;  Location: [facility] ENDOSCOPY;  Service: Cardiovascular;  Laterality: N/A;  . TONSILLECTOMY    .  TOTAL ABDOMINAL HYSTERECTOMY    . TYMPANOPLASTY Bilateral    Social History   Socioeconomic History  . Marital status: Married    Spouse name: [name]  . Number of children: 2  . Years of education: 63  . Highest education level: Not on file  Occupational History  . Not on file  Tobacco Use  . Smoking status: Former Smoker    Types: Cigarettes    Quit date: 01/19/1984    Years since quitting: 35.7  . Smokeless tobacco: Never Used  Vaping Use  . Vaping Use: Never used  Substance and Sexual Activity  . Alcohol use: Yes    Alcohol/week: 1.0 standard drink    Types: 1 Glasses of wine per week    Comment: Every evening   . Drug use: No  . Sexual activity: Not Currently  Other Topics Concern  . Not on file  Social History Narrative   Patient is Married 1955. 2 kids. 46 grandkid-12 years old in 2016. College graduate [employer] [place]).    Lives in apartment,  Independent Living  section at [facility] since 02/2013.      Stay at home mother.       No Smoking history, Mod. alcohol use.   Patient has a living will, POA      Hobbies: CPU and painting         Social Determinants of Health   Financial Resource Strain:   . Difficulty of Paying Living Expenses: Not on file  Food Insecurity:   . Worried About Charity fundraiser in the Last Year: Not on file  . Ran Out of Food in the Last Year: Not on file  Transportation Needs:   . Lack of Transportation (Medical): Not on file  . Lack of Transportation (Non-Medical): Not on file  Physical Activity:   . Days of Exercise per Week: Not on file  . Minutes of Exercise per Session: Not on file  Stress:   . Feeling of Stress : Not on file  Social Connections:   . Frequency of Communication with Friends and Family: Not on file  . Frequency of Social Gatherings with Friends and Family: Not on file  . Attends Religious Services: Not on file  . Active Member of Clubs or Organizations: Not on file  . Attends English as a second language teacher Meetings: Not on file  . Marital Status: Not on file   Allergies  Allergen Reactions  . Akten [Lidocaine]   . Amlodipine Swelling and Other (See Comments)    Lower extremity swelling   . Augmentin [Amoxicillin-Pot Clavulanate] Other (See Comments)    Stomach Pain/cramps Did it involve swelling of the face/tongue/throat, SOB, or low BP? No Did it involve sudden or severe rash/hives, skin peeling, or any reaction on the inside of your mouth or nose? No Did you need to seek medical attention at a hospital or doctor's office? Unknown When did it last happen?unknown If all above answers are "NO", may proceed with cephalosporin  use.    Family History  Problem Relation Age of Onset  . Hypertension Father   . Heart disease Father        patient does not know details.   . Ovarian cancer Mother 51  . Cancer Sister        colon  . Heart disease Sister   . Cancer Sister   . Cancer Sister        hodgkin disease  . Breast cancer Daughter   . Cancer Daughter        breast    Current Outpatient Medications (Endocrine & Metabolic):  .  levothyroxine (SYNTHROID) 100 MCG tablet, TAKE 1 TABLET ONCE DAILY BEFORE BREAKFAST.  Current Outpatient Medications (Cardiovascular):  .  hydrALAZINE (APRESOLINE) 50 MG tablet, TAKE 1 1/2 TABLETS THREE TIMES DAILY. .  metoprolol succinate (TOPROL-XL) 25 MG 24 hr tablet, Take 1 tablet (25 mg total) by mouth 2 (two) times daily. [name] Kitchen  torsemide (DEMADEX) 20 MG tablet, Take 4 tablets (80 mg total) by mouth in the morning AND 2 tablets (40 mg total) every evening.  Current Outpatient Medications (Respiratory):  .  sodium chloride (OCEAN) 0.65 % SOLN nasal spray, Place 2 sprays into both nostrils 2 (two) times daily.   Current Outpatient Medications (Analgesics):  .  acetaminophen (TYLENOL) 325 MG tablet, Take 650 mg by mouth every 4 (four) hours as needed. .  fentaNYL (DURAGESIC) 12 MCG/HR, Place one patch onto the skin every 3 days.  Remove old patch .  traMADol (ULTRAM) 50 MG tablet, TAKE ONE TABLET AT BEDTIME.  Current Outpatient Medications (Hematological):  .  apixaban (ELIQUIS) 2.5 MG TABS tablet, Take 1 tablet (2.5 mg total) by mouth 2 (two) times daily.  Current Outpatient Medications (Other):  .  gabapentin (NEURONTIN) 100 MG capsule, Take 100 mg by mouth at bedtime. .  hydroxyurea (HYDREA) 500 MG capsule, 2 capsules daily from Mondays to Fridays and 1 capsule daily on Saturdays and Sunday .  Magnesium 250 MG TABS, Take 250 mg by mouth daily.  .  Melatonin 10 MG TABS, Take 10 mg by mouth at bedtime. .  Multiple Vitamins-Minerals (PRESERVISION AREDS 2) CAPS, Take 1 capsule by mouth 2 (two) times daily.  .  sennosides-docusate sodium (SENOKOT-S) 8.6-50 MG tablet, Take 2 tablets by mouth 2 (two) times daily.   Reviewed prior external information including notes and imaging from  primary care provider As well as notes that were available from care everywhere and other healthcare systems.  Past medical history, social, surgical and family history all reviewed in electronic medical record.  No pertanent information unless stated regarding to the chief complaint.   Review of Systems:  No headache, visual changes, nausea, vomiting, diarrhea, constipation, dizziness, abdominal pain, skin rash, fevers, chills, night sweats, weight loss, swollen lymph nodes, body aches, joint swelling, chest pain, shortness of breath, mood changes. POSITIVE muscle aches  Objective  Blood pressure 102/70, pulse 70, height 5' (1.524 m), weight 106 lb (48.1 kg), SpO2 90 %.   General: No apparent distress alert and oriented x3 mood and affect normal, dressed appropriately.  HEENT: Pupils equal, extraocular movements intact  Respiratory: Patient's speak in full sentences and does not appear short of breath   Gait antalgic walking with the aid of a rolling walker MSK: Right knee does have some mild instability noted.  Patient states that  the left knee seems to be doing relatively well he not has instability but none of the significant swelling that we have  seen previously.   After informed written and verbal consent, patient was seated on exam table. Right knee was prepped with alcohol swab and utilizing anterolateral approach, patient's right knee space was injected with 4:1  marcaine 0.5%: Kenalog 40mg /dL. Patient tolerated the procedure well without immediate complications.   Impression and Recommendations:     The above documentation has been reviewed and is accurate and complete Lyndal Pulley, DO       Note: This dictation was prepared with Dragon dictation along with smaller phrase technology. Any transcriptional errors that result from this process are unintentional.

## 2019-10-22 ENCOUNTER — Other Ambulatory Visit: Payer: Self-pay

## 2019-10-22 DIAGNOSIS — M545 Low back pain, unspecified: Secondary | ICD-10-CM

## 2019-10-22 MED ORDER — FENTANYL 12 MCG/HR TD PT72: MEDICATED_PATCH | TRANSDERMAL | 0 refills | Status: DC

## 2019-10-22 NOTE — Telephone Encounter (Signed)
Patient called requesting refill. Last refilled 09/20/19.

## 2019-10-28 ENCOUNTER — Other Ambulatory Visit (HOSPITAL_COMMUNITY): Payer: Self-pay | Admitting: Cardiology

## 2019-11-01 NOTE — Progress Notes (Signed)
[facility] Note  11/02/2019     CHIEF COMPLAINT Patient presents for Retina Evaluation   HISTORY OF PRESENT ILLNESS: Sarah Mays is a 86 y.o. female who presents to the clinic today for:   HPI    Retina Evaluation    In left eye.  This started 3 days ago.  Duration of 3 days.  Context:  distance vision and near vision.  I, the attending physician,  performed the HPI with the patient and updated documentation appropriately.          Comments    Pt states she has noticed a decrease in her vision over the last 3 days in the left eye only.  Patient reports treatment of IVA OS x 1 with Dr. Zadie Rhine.  Patient denies eye pain or discomfort and denies any new or worsening floaters or fol OU.       Last edited by Bernarda Caffey, MD on 11/02/2019  9:12 AM. (History)    pt is a Dr. Zadie Rhine pt seen on call coverage. c/o of decreased VA OS, pt states she saw him last month and started getting injections in her left eye, she states she has an appointment with him next month to start treatment in her right eye  Referring physician: Hurman Horn, MD [place],  [place] [address]  HISTORICAL INFORMATION:   Selected notes from the MEDICAL RECORD NUMBER Dr. Zadie Rhine pt c/o decreased VA OS LEE:  Ocular Hx- PMH-    CURRENT MEDICATIONS: No current outpatient medications on file. (Ophthalmic Drugs)   No current facility-administered medications for this visit. (Ophthalmic Drugs)   Current Outpatient Medications (Other)  Medication Sig  . acetaminophen (TYLENOL) 325 MG tablet Take 650 mg by mouth every 4 (four) hours as needed.  [name] Kitchen apixaban (ELIQUIS) 2.5 MG TABS tablet Take 1 tablet (2.5 mg total) by mouth 2 (two) times daily.  . fentaNYL (DURAGESIC) 12 MCG/HR Place one patch onto the skin every 3 days. Remove old patch  . gabapentin (NEURONTIN) 100 MG capsule Take 100 mg by mouth at bedtime.  . hydrALAZINE (APRESOLINE) 50 MG tablet Take 1.5 tablets  (75 mg total) by mouth 3 (three) times daily. TAKE 1 1/2 TABLETS THREE TIMES DAILY.  . hydroxyurea (HYDREA) 500 MG capsule 2 capsules daily from Mondays to Fridays and 1 capsule daily on Saturdays and Sunday  . levothyroxine (SYNTHROID) 100 MCG tablet TAKE 1 TABLET ONCE DAILY BEFORE BREAKFAST.  . Magnesium 250 MG TABS Take 250 mg by mouth daily.   . Melatonin 10 MG TABS Take 10 mg by mouth at bedtime.  . metoprolol succinate (TOPROL-XL) 25 MG 24 hr tablet Take 1 tablet (25 mg total) by mouth 2 (two) times daily.  . Multiple Vitamins-Minerals (PRESERVISION AREDS 2) CAPS Take 1 capsule by mouth 2 (two) times daily.   . sennosides-docusate sodium (SENOKOT-S) 8.6-50 MG tablet Take 2 tablets by mouth 2 (two) times daily.  . sodium chloride (OCEAN) 0.65 % SOLN nasal spray Place 2 sprays into both nostrils 2 (two) times daily.   [name] Kitchen torsemide (DEMADEX) 20 MG tablet Take 4 tablets (80 mg total) by mouth in the morning AND 2 tablets (40 mg total) every evening.  . traMADol (ULTRAM) 50 MG tablet TAKE ONE TABLET AT BEDTIME.   No current facility-administered medications for this visit. (Other)      REVIEW OF SYSTEMS: ROS    Negative for: Constitutional, Gastrointestinal, Neurological, Skin, Genitourinary, Musculoskeletal, HENT, Endocrine,  Cardiovascular, Eyes, Respiratory, Psychiatric, Allergic/Imm, Heme/Lymph   Last edited by Doneen Poisson on 11/02/2019  8:42 AM. (History)       ALLERGIES Allergies  Allergen Reactions  . Akten [Lidocaine]   . Amlodipine Swelling and Other (See Comments)    Lower extremity swelling   . Augmentin [Amoxicillin-Pot Clavulanate] Other (See Comments)    Stomach Pain/cramps Did it involve swelling of the face/tongue/throat, SOB, or low BP? No Did it involve sudden or severe rash/hives, skin peeling, or any reaction on the inside of your mouth or nose? No Did you need to seek medical attention at a hospital or doctor's office? Unknown When did it last  happen?unknown If all above answers are "NO", may proceed with cephalosporin use.     PAST MEDICAL HISTORY Past Medical History:  Diagnosis Date  . Abnormal stress test    a. 11/2011 Ex MV: EF 80%, small, partially reversible anteroapical defect->mild ischemia vs attenuation-->med Rx.  [name] Kitchen Alopecia, unspecified   . Arthritis   . Chronic diastolic CHF (congestive heart failure) ([place])    a. 11/2013 Echo: EF 60-65%, no rwma, mild TR, PASP 56mmHg.  . Closed fracture of lower end of radius with ulna   . Deafness    Left, s/p multiple surgeries  . Essential hypertension   . Gouty arthropathy, unspecified   . History of kidney stones   . Hypothyroidism   . Insomnia, unspecified   . Macular degeneration    Left, s/p inj. tx  . Mitral valve disorders(424.0)   . Neck mass    right, w/u including MRI negative  . PAF (paroxysmal atrial fibrillation) (H[facility])    a. on amio (02/2012 mild obstruction on PFT's) and xarelto.  . Polycythemia vera(238.4)   . Pure hypercholesterolemia   . Senile osteoporosis    Reclast in the past  . Tricuspid valve disorders, specified as nonrheumatic    Past Surgical History:  Procedure Laterality Date  . BREAST BIOPSY  06/18/1998   left  . CARDIOVERSION N/A 07/15/2017   Procedure: CARDIOVERSION;  Surgeon: Larey Dresser, MD;  Location: [facility] ENDOSCOPY;  Service: Cardiovascular;  Laterality: N/A;  . CARDIOVERSION N/A 07/21/2018   Procedure: CARDIOVERSION;  Surgeon: Larey Dresser, MD;  Location: [facility] ENDOSCOPY;  Service: Cardiovascular;  Laterality: N/A;  . CARDIOVERSION N/A 10/06/2018   Procedure: CARDIOVERSION;  Surgeon: Larey Dresser, MD;  Location: [facility] ENDOSCOPY;  Service: Cardiovascular;  Laterality: N/A;  . ELECTROPHYSIOLOGIC STUDY N/A 01/08/2016   Procedure: Atrial Fibrillation Ablation;  Surgeon: Thompson Grayer, MD;  Location: [facility] CV LAB;  Service: Cardiovascular;  Laterality: N/A;  . FEMUR IM NAIL Right 10/08/2015   Procedure: INTRAMEDULLARY  (IM) NAIL FEMORAL RIGHT;  Surgeon: Paralee Cancel, MD;  Location: [facility] ORS;  Service: Orthopedics;  Laterality: Right;  . MASS EXCISION Right 11/26/2016   Procedure: EXCISION RIGHT LATERAL NECK MASS;  Surgeon: Jerrell Belfast, MD;  Location: [place];  Service: ENT;  Laterality: Right;  . TEE WITHOUT CARDIOVERSION N/A 07/15/2017   Procedure: TRANSESOPHAGEAL ECHOCARDIOGRAM (TEE);  Surgeon: Larey Dresser, MD;  Location: [facility] ENDOSCOPY;  Service: Cardiovascular;  Laterality: N/A;  . TONSILLECTOMY    . TOTAL ABDOMINAL HYSTERECTOMY    . TYMPANOPLASTY Bilateral     FAMILY HISTORY Family History  Problem Relation Age of Onset  . Hypertension Father   . Heart disease Father        patient does not know details.   . Ovarian cancer Mother 34  . Cancer Sister  colon  . Heart disease Sister   . Cancer Sister   . Cancer Sister        hodgkin disease  . Breast cancer Daughter   . Cancer Daughter        breast    SOCIAL HISTORY Social History   Tobacco Use  . Smoking status: Former Smoker    Types: Cigarettes    Quit date: 01/19/1984    Years since quitting: 35.8  . Smokeless tobacco: Never Used  Vaping Use  . Vaping Use: Never used  Substance Use Topics  . Alcohol use: Yes    Alcohol/week: 1.0 standard drink    Types: 1 Glasses of wine per week    Comment: Every evening   . Drug use: No         OPHTHALMIC EXAM:  Base Eye Exam    Visual Acuity (Snellen - Linear)      Right Left   Dist [place] 20/80 -2 CF @ 2'   Dist ph [place] NI NI       Tonometry (Tonopen, 8:49 AM)      Right Left   Pressure 15 15       Pupils      Dark Light Shape React APD   Right 3 2 Round Brisk 0   Left 3 2 Irregular Minimal 0       Visual Fields      Left Right    Full Full       Extraocular Movement      Right Left    Full Full       Neuro/Psych    Oriented x3: Yes   Mood/Affect: Normal       Dilation    Both eyes: 1.0% Mydriacyl, 2.5% Phenylephrine @ 8:49 AM        Slit Lamp  and Fundus Exam    External Exam      Right Left   External Normal Normal       Slit Lamp Exam      Right Left   Lids/Lashes Dermatochalasis - upper lid, midl Meibomian gland dysfunction Dermatochalasis - upper lid, midl Meibomian gland dysfunction   Conjunctiva/Sclera Inferior Conjunctivochalasis Inferior Conjunctivochalasis   Cornea 1+ Punctate epithelial erosions, mild endo pigment 1+ Punctate epithelial erosions, Debris in tear film, mild endo pigment, well healed temporal cataract wounds   Anterior Chamber Deep and quiet Deep and quiet   Iris Round and dilated Round and moderately dilated, Transillumination defects and atrophy superiorly, +iridodenesis    Lens 3 pice Posterior chamber intraocular lens with mild temporal displacement, mild pseudophakodenesis Posterior chamber intraocular lens    Anterior Vitreous Vitreous syneresis post        Fundus Exam      Right Left   Disc Pink and Sharp Pink and Sharp   C/D Ratio 0.4 0.4   Macula Blunted foveal reflex, central Atrophy, refractile Drusen, Pigment clumping, no heme Blunted foveal reflex, RPE mottling, clumping and atrophy, Drusen, sub-retinal fibrosis and heme IT to fovea   Vessels attenuated, Tortuous attenuated, Tortuous   Periphery Attached, scattered drusen 360 Attached. 360 drusen           Refraction    Wearing Rx   Didn't bring       Manifest Refraction      Sphere Cylinder Axis Dist VA   Right -0.25 +1.50 015 20/50+1   Left NI +/-3.00             IMAGING  AND PROCEDURES  Imaging and Procedures for 11/02/2019  OCT, Retina - OU - Both Eyes       Right Eye Quality was good. Central Foveal Thickness: 228. Progression has no prior data. Findings include subretinal hyper-reflective material, intraretinal hyper-reflective material, central retinal atrophy, outer retinal atrophy, retinal drusen , normal foveal contour, intraretinal fluid, subretinal fluid (Central ORA and [facility]).   Left Eye Quality was good.  Central Foveal Thickness: 343. Progression has no prior data. Findings include outer retinal atrophy, inner retinal atrophy, abnormal foveal contour, no IRF, no SRF, subretinal hyper-reflective material (significant outer retinal atrophy and scarring; pocket of SRHM inf temp to fovea).   Notes *Images captured and stored on drive  Diagnosis / Impression:  Exudative ARMD OU OS with significant outer retinal atrophy and scarring; pocket of SRHM inf temp to fovea  Clinical management:  See below  Abbreviations: NFP - Normal foveal profile. CME - cystoid macular edema. PED - pigment epithelial detachment. IRF - intraretinal fluid. SRF - subretinal fluid. EZ - ellipsoid zone. ERM - epiretinal membrane. ORA - outer retinal atrophy. ORT - outer retinal tubulation. SRHM - subretinal hyper-reflective material. IRHM - intraretinal hyper-reflective material                 ASSESSMENT/PLAN:    ICD-10-CM   1. Exudative age-related macular degeneration of right eye with active choroidal neovascularization ([place])  H35.3211   2. Retinal edema  H35.81 OCT, Retina - OU - Both Eyes  3. Exudative age-related macular degeneration of left eye with active choroidal neovascularization ([place])  H35.3221   4. Retinal hemorrhage, left  H35.62   5. Neovascular glaucoma, right eye, severe stage  H40.51X3    **[place] pt w/ history of exudative ARMD OU seen today -- on call coverage** - pt presents with complaint of decreased vision OS x3 days  1-3. Exudative age related macular degeneration, OU  - Dr. Zadie Rhine pt, s/p IVA OS #1 (08.19.21)  - BCVA stable at CF 2' OS  - OCT shows OD: central ORA and SRHM; OS: significant ORA and scarring; pocket of SRHM IT to fovea  - OD getting regular IVE  - reassured patient that exam OU appears stable in comparison to Dr. Dahlia Bailiff most recent exam and findings  - f/u with Dr. Zadie Rhine as scheduled  4. Retinal hemorrhage OS  5. History of neovascular glaucoma OD  - IOP  15   Ophthalmic Meds Ordered this visit:  No orders of the defined types were placed in this encounter.      Return if symptoms worsen or fail to improve.  There are no Patient Instructions on file for this visit.   Explained the diagnoses, plan, and follow up with the patient and they expressed understanding.  Patient expressed understanding of the importance of proper follow up care.   This document serves as a record of services personally performed by Gardiner Sleeper, MD, PhD. It was created on their behalf by San Jetty. Owens Shark, OA an ophthalmic technician. The creation of this record is the provider's dictation and/or activities during the visit.    Electronically signed by: San Jetty. Owens Shark, [place] 09.23.2021 4:41 PM   Gardiner Sleeper, M.D., Ph.D. Diseases & Surgery of the Retina and Vitreous [facility]  I have reviewed the above documentation for accuracy and completeness, and I agree with the above. Gardiner Sleeper, M.D., Ph.D. 11/02/19 4:41 PM   Abbreviations: M myopia (nearsighted); A astigmatism; H hyperopia (  farsighted); P presbyopia; Mrx spectacle prescription;  CTL contact lenses; OD right eye; OS left eye; OU both eyes  XT exotropia; ET esotropia; PEK punctate epithelial keratitis; PEE punctate epithelial erosions; DES dry eye syndrome; MGD meibomian gland dysfunction; ATs artificial tears; PFAT's preservative free artificial tears; [place] nuclear sclerotic cataract; PSC posterior subcapsular cataract; ERM epi-retinal membrane; PVD posterior vitreous detachment; RD retinal detachment; DM diabetes mellitus; DR diabetic retinopathy; NPDR non-proliferative diabetic retinopathy; PDR proliferative diabetic retinopathy; CSME clinically significant macular edema; DME diabetic macular edema; dbh dot blot hemorrhages; CWS cotton wool spot; POAG primary open angle glaucoma; C/D cup-to-disc ratio; HVF humphrey visual field; GVF goldmann visual field; OCT optical  coherence tomography; IOP intraocular pressure; BRVO Branch retinal vein occlusion; CRVO central retinal vein occlusion; CRAO central retinal artery occlusion; BRAO branch retinal artery occlusion; RT retinal tear; SB scleral buckle; PPV pars plana vitrectomy; VH Vitreous hemorrhage; PRP panretinal laser photocoagulation; IVK intravitreal kenalog; VMT vitreomacular traction; MH Macular hole;  NVD neovascularization of the disc; NVE neovascularization elsewhere; AREDS age related eye disease study; ARMD age related macular degeneration; POAG primary open angle glaucoma; EBMD epithelial/anterior basement membrane dystrophy; ACIOL anterior chamber intraocular lens; IOL intraocular lens; PCIOL posterior chamber intraocular lens; Phaco/IOL phacoemulsification with intraocular lens placement; [place] photorefractive keratectomy; LASIK laser assisted in situ keratomileusis; HTN hypertension; DM diabetes mellitus; COPD chronic obstructive pulmonary disease

## 2019-11-02 ENCOUNTER — Other Ambulatory Visit: Payer: Self-pay

## 2019-11-02 ENCOUNTER — Ambulatory Visit (INDEPENDENT_AMBULATORY_CARE_PROVIDER_SITE_OTHER): Payer: Medicare Other | Admitting: Ophthalmology

## 2019-11-02 ENCOUNTER — Encounter (INDEPENDENT_AMBULATORY_CARE_PROVIDER_SITE_OTHER): Payer: Self-pay | Admitting: Ophthalmology

## 2019-11-02 DIAGNOSIS — H3562 Retinal hemorrhage, left eye: Secondary | ICD-10-CM

## 2019-11-02 DIAGNOSIS — H4051X3 Glaucoma secondary to other eye disorders, right eye, severe stage: Secondary | ICD-10-CM

## 2019-11-02 DIAGNOSIS — H353211 Exudative age-related macular degeneration, right eye, with active choroidal neovascularization: Secondary | ICD-10-CM

## 2019-11-02 DIAGNOSIS — H353221 Exudative age-related macular degeneration, left eye, with active choroidal neovascularization: Secondary | ICD-10-CM | POA: Diagnosis not present

## 2019-11-02 DIAGNOSIS — H3581 Retinal edema: Secondary | ICD-10-CM | POA: Diagnosis not present

## 2019-11-04 ENCOUNTER — Other Ambulatory Visit (HOSPITAL_COMMUNITY): Payer: Self-pay | Admitting: Cardiology

## 2019-11-06 ENCOUNTER — Ambulatory Visit (INDEPENDENT_AMBULATORY_CARE_PROVIDER_SITE_OTHER): Payer: Medicare Other | Admitting: Ophthalmology

## 2019-11-06 ENCOUNTER — Encounter (INDEPENDENT_AMBULATORY_CARE_PROVIDER_SITE_OTHER): Payer: Self-pay | Admitting: Ophthalmology

## 2019-11-06 ENCOUNTER — Other Ambulatory Visit: Payer: Self-pay

## 2019-11-06 DIAGNOSIS — H353211 Exudative age-related macular degeneration, right eye, with active choroidal neovascularization: Secondary | ICD-10-CM | POA: Diagnosis not present

## 2019-11-06 DIAGNOSIS — H353221 Exudative age-related macular degeneration, left eye, with active choroidal neovascularization: Secondary | ICD-10-CM

## 2019-11-06 MED ORDER — AFLIBERCEPT 2MG/0.05ML IZ SOLN FOR KALEIDOSCOPE
2.0000 mg | INTRAVITREAL | Status: AC | PRN
  Administered 2019-11-06: 2 mg via INTRAVITREAL

## 2019-11-06 NOTE — Assessment & Plan Note (Signed)
OS, much less active large CNVM and less hemorrhage inferotemporal, follow-up examination scheduled

## 2019-11-06 NOTE — Progress Notes (Signed)
11/06/2019     CHIEF COMPLAINT Patient presents for Retina Follow Up   HISTORY OF PRESENT ILLNESS: Sarah Mays is a 86 y.o. female who presents to the clinic today for:   HPI    Retina Follow Up    Patient presents with  Wet AMD.  In right eye.  This started 8 weeks ago.  Severity is mild.  Duration of 8 weeks.  Since onset it is stable.          Comments    8 Week AMD F/U OU, poss Eylea OD  Pt c/o blur OS. VA stable OD. Pt denies ocular pain, flashes of light, or floaters OU.         Last edited by Rockie Neighbours, [place] on 11/06/2019  3:30 PM. (History)      Referring physician: Gayland Curry, DO [place],   [id number]  HISTORICAL INFORMATION:   Selected notes from the [facility]: No current outpatient medications on file. (Ophthalmic Drugs)   No current facility-administered medications for this visit. (Ophthalmic Drugs)   Current Outpatient Medications (Other)  Medication Sig  . acetaminophen (TYLENOL) 325 MG tablet Take 650 mg by mouth every 4 (four) hours as needed.  [name] Kitchen ELIQUIS 2.5 MG TABS tablet TAKE 1 TABLET BY MOUTH TWICE DAILY.  . fentaNYL (DURAGESIC) 12 MCG/HR Place one patch onto the skin every 3 days. Remove old patch  . gabapentin (NEURONTIN) 100 MG capsule Take 100 mg by mouth at bedtime.  . hydrALAZINE (APRESOLINE) 50 MG tablet Take 1.5 tablets (75 mg total) by mouth 3 (three) times daily. TAKE 1 1/2 TABLETS THREE TIMES DAILY.  . hydroxyurea (HYDREA) 500 MG capsule 2 capsules daily from Mondays to Fridays and 1 capsule daily on Saturdays and Sunday  . levothyroxine (SYNTHROID) 100 MCG tablet TAKE 1 TABLET ONCE DAILY BEFORE BREAKFAST.  . Magnesium 250 MG TABS Take 250 mg by mouth daily.   . Melatonin 10 MG TABS Take 10 mg by mouth at bedtime.  . metoprolol succinate (TOPROL-XL) 25 MG 24 hr tablet Take 1 tablet (25 mg total) by mouth 2 (two) times daily.  . Multiple Vitamins-Minerals  (PRESERVISION AREDS 2) CAPS Take 1 capsule by mouth 2 (two) times daily.   . sennosides-docusate sodium (SENOKOT-S) 8.6-50 MG tablet Take 2 tablets by mouth 2 (two) times daily.  . sodium chloride (OCEAN) 0.65 % SOLN nasal spray Place 2 sprays into both nostrils 2 (two) times daily.   [name] Kitchen torsemide (DEMADEX) 20 MG tablet Take 4 tablets (80 mg total) by mouth in the morning AND 2 tablets (40 mg total) every evening.  . traMADol (ULTRAM) 50 MG tablet TAKE ONE TABLET AT BEDTIME.   No current facility-administered medications for this visit. (Other)      REVIEW OF SYSTEMS:    ALLERGIES Allergies  Allergen Reactions  . Akten [Lidocaine]   . Amlodipine Swelling and Other (See Comments)    Lower extremity swelling   . Augmentin [Amoxicillin-Pot Clavulanate] Other (See Comments)    Stomach Pain/cramps Did it involve swelling of the face/tongue/throat, SOB, or low BP? No Did it involve sudden or severe rash/hives, skin peeling, or any reaction on the inside of your mouth or nose? No Did you need to seek medical attention at a hospital or doctor's office? Unknown When did it last happen?unknown If all above answers are "NO", may proceed with cephalosporin use.  PAST MEDICAL HISTORY Past Medical History:  Diagnosis Date  . Abnormal stress test    a. 11/2011 Ex MV: EF 80%, small, partially reversible anteroapical defect->mild ischemia vs attenuation-->med Rx.  [name] Kitchen Alopecia, unspecified   . Arthritis   . Chronic diastolic CHF (congestive heart failure) ([place])    a. 11/2013 Echo: EF 60-65%, no rwma, mild TR, PASP 39mmHg.  . Closed fracture of lower end of radius with ulna   . Deafness    Left, s/p multiple surgeries  . Essential hypertension   . Gouty arthropathy, unspecified   . History of kidney stones   . Hypothyroidism   . Insomnia, unspecified   . Macular degeneration    Left, s/p inj. tx  . Mitral valve disorders(424.0)   . Neck mass    right, w/u including MRI  negative  . PAF (paroxysmal atrial fibrillation) (HCC)    a. on amio (02/2012 mild obstruction on PFT's) and xarelto.  . Polycythemia vera(238.4)   . Pure hypercholesterolemia   . Senile osteoporosis    Reclast in the past  . Tricuspid valve disorders, specified as nonrheumatic    Past Surgical History:  Procedure Laterality Date  . BREAST BIOPSY  06/18/1998   left  . CARDIOVERSION N/A 07/15/2017   Procedure: CARDIOVERSION;  Surgeon: Larey Dresser, MD;  Location: [facility] ENDOSCOPY;  Service: Cardiovascular;  Laterality: N/A;  . CARDIOVERSION N/A 07/21/2018   Procedure: CARDIOVERSION;  Surgeon: Larey Dresser, MD;  Location: [facility] ENDOSCOPY;  Service: Cardiovascular;  Laterality: N/A;  . CARDIOVERSION N/A 10/06/2018   Procedure: CARDIOVERSION;  Surgeon: Larey Dresser, MD;  Location: [facility] ENDOSCOPY;  Service: Cardiovascular;  Laterality: N/A;  . ELECTROPHYSIOLOGIC STUDY N/A 01/08/2016   Procedure: Atrial Fibrillation Ablation;  Surgeon: Thompson Grayer, MD;  Location: [facility] CV LAB;  Service: Cardiovascular;  Laterality: N/A;  . FEMUR IM NAIL Right 10/08/2015   Procedure: INTRAMEDULLARY (IM) NAIL FEMORAL RIGHT;  Surgeon: Paralee Cancel, MD;  Location: [facility] ORS;  Service: Orthopedics;  Laterality: Right;  . MASS EXCISION Right 11/26/2016   Procedure: EXCISION RIGHT LATERAL NECK MASS;  Surgeon: Jerrell Belfast, MD;  Location: [facility];  Service: ENT;  Laterality: Right;  . TEE WITHOUT CARDIOVERSION N/A 07/15/2017   Procedure: TRANSESOPHAGEAL ECHOCARDIOGRAM (TEE);  Surgeon: Larey Dresser, MD;  Location: [facility] ENDOSCOPY;  Service: Cardiovascular;  Laterality: N/A;  . TONSILLECTOMY    . TOTAL ABDOMINAL HYSTERECTOMY    . TYMPANOPLASTY Bilateral     FAMILY HISTORY Family History  Problem Relation Age of Onset  . Hypertension Father   . Heart disease Father        patient does not know details.   . Ovarian cancer Mother 34  . Cancer Sister        colon  . Heart disease Sister   . Cancer Sister   .  Cancer Sister        hodgkin disease  . Breast cancer Daughter   . Cancer Daughter        breast    SOCIAL HISTORY Social History   Tobacco Use  . Smoking status: Former Smoker    Types: Cigarettes    Quit date: 01/19/1984    Years since quitting: 35.8  . Smokeless tobacco: Never Used  Vaping Use  . Vaping Use: Never used  Substance Use Topics  . Alcohol use: Yes    Alcohol/week: 1.0 standard drink    Types: 1 Glasses of wine per week    Comment: Every evening   .  Drug use: No         OPHTHALMIC EXAM:  Base Eye Exam    Visual Acuity (ETDRS)      Right Left   Dist cc 20/50 +2 20/200   Dist ph cc NI NI   Correction: Glasses       Tonometry (Tonopen, 3:35 PM)      Right Left   Pressure 14 14       Pupils      Dark Light Shape React APD   Right 4 3 Round Slow None   Left 4 3 Irregular Slow None       Visual Fields (Counting fingers)      Left Right    Full Full       Extraocular Movement      Right Left    Full Full       Neuro/Psych    Oriented x3: Yes   Mood/Affect: Normal       Dilation    Both eyes: 1.0% Mydriacyl, 2.5% Phenylephrine @ 3:35 PM        Slit Lamp and Fundus Exam    External Exam      Right Left   External Normal Normal       Slit Lamp Exam      Right Left   Lids/Lashes Normal Normal   Conjunctiva/Sclera White and quiet White and quiet   Cornea Clear Clear   Anterior Chamber Deep and quiet Deep and quiet   Iris Round and reactive Round and reactive   Lens Posterior chamber intraocular lens  sutured Posterior chamber intraocular lens   Anterior Vitreous Normal vitrectomized       Fundus Exam      Right Left   Posterior Vitreous Posterior vitreous detachment vitrectomized   Disc Normal Normal   C/D Ratio 0.45 0.45   Macula Atrophy, Age related macular degeneration, Advanced age related macular degeneration, [place], Geographic atrophy, no macular thickening, no hemorrhage Disciform scar with less subretinal  hemorrhage inferiorly at the edge of the previous CNVM., Geographic atrophy accounts for the acuity   Vessels Normal Normal   Periphery Normal Normal          IMAGING AND PROCEDURES  Imaging and Procedures for 11/06/19  OCT, Retina - OU - Both Eyes       Right Eye Quality was good. Scan locations included subfoveal. Central Foveal Thickness: 217. Findings include disciform scar, subretinal scarring, outer retinal atrophy.   Left Eye Quality was good. Scan locations included subfoveal. Central Foveal Thickness: 345. Progression has been stable. Findings include disciform scar, choroidal neovascular membrane, outer retinal atrophy, central retinal atrophy.   Notes OD, region nasal and temporal to the fovea with much less intraretinal fluid.  Currently at 8 week follow-up.  OD  OS, disciform subfoveal scar with large region inferotemporal of subretinal fluid and hemorrhage now improved currently at 40 days post injection injection interval       Intravitreal Injection, Pharmacologic Agent - OD - Right Eye       Time Out 11/06/2019. 4:24 PM. Confirmed correct patient, procedure, site, and patient consented.   Anesthesia Topical anesthesia was used. Anesthetic medications included Lidocaine 4%.   Procedure Preparation included 10% betadine to eyelids, 5% betadine to ocular surface, Tobramycin 0.3%. A 30 gauge needle was used.   Injection:  2 mg aflibercept Alfonse Flavors) SOLN   NDC: [id number], Lot: [id number]   Route: Intravitreal, Site: Right Eye, Waste: 0 mg  Post-op Post injection  exam found visual acuity of at least counting fingers. The patient tolerated the procedure well. There were no complications. The patient received written and verbal post procedure care education.   Notes Lidocaine topically applied inferotemporal with sterile Q-tip.                ASSESSMENT/PLAN:  Exudative age-related macular degeneration of right eye with active choroidal  neovascularization (HCC) OD, improved over the last 8 weeks, repeat injection OD today intravitreal Eylea  Exudative age-related macular degeneration of left eye with active choroidal neovascularization (HCC) OS, much less active large CNVM and less hemorrhage inferotemporal, follow-up examination scheduled      ICD-10-CM   1. Exudative age-related macular degeneration of right eye with active choroidal neovascularization (HCC)  H35.3211 OCT, Retina - OU - Both Eyes    Intravitreal Injection, Pharmacologic Agent - OD - Right Eye    aflibercept (EYLEA) SOLN 2 mg  2. Exudative age-related macular degeneration of left eye with active choroidal neovascularization (HCC)  H35.3221 OCT, Retina - OU - Both Eyes    1.  OD much less involved and active CNVM now 8 weeks post injection intravitreal Eylea.  Repeat injection today  2.  OS, recent blurred vision, of uncertain etiology reminded standing due to central geographic atrophy limiting acuity.  Will follow up appointment left eye as scheduled in November 2021  3.  Follow-up OD in 8 weeks  Ophthalmic Meds Ordered this visit:  Meds ordered this encounter  Medications  . aflibercept (EYLEA) SOLN 2 mg       Return in about 8 weeks (around 01/01/2020) for dilate, OD, EYLEA OCT.  There are no Patient Instructions on file for this visit.   Explained the diagnoses, plan, and follow up with the patient and they expressed understanding.  Patient expressed understanding of the importance of proper follow up care.   Clent Demark Alyssia Heese M.D. Diseases & Surgery of the Retina and Vitreous Retina & Diabetic [place] 11/06/19     Abbreviations: M myopia (nearsighted); A astigmatism; H hyperopia (farsighted); P presbyopia; Mrx spectacle prescription;  CTL contact lenses; OD right eye; OS left eye; OU both eyes  XT exotropia; ET esotropia; PEK punctate epithelial keratitis; PEE punctate epithelial erosions; DES dry eye syndrome; MGD meibomian gland  dysfunction; ATs artificial tears; PFAT's preservative free artificial tears; [place] nuclear sclerotic cataract; PSC posterior subcapsular cataract; ERM epi-retinal membrane; PVD posterior vitreous detachment; RD retinal detachment; DM diabetes mellitus; DR diabetic retinopathy; NPDR non-proliferative diabetic retinopathy; PDR proliferative diabetic retinopathy; CSME clinically significant macular edema; DME diabetic macular edema; dbh dot blot hemorrhages; CWS cotton wool spot; POAG primary open angle glaucoma; C/D cup-to-disc ratio; HVF humphrey visual field; GVF goldmann visual field; OCT optical coherence tomography; IOP intraocular pressure; BRVO Branch retinal vein occlusion; CRVO central retinal vein occlusion; CRAO central retinal artery occlusion; BRAO branch retinal artery occlusion; RT retinal tear; SB scleral buckle; PPV pars plana vitrectomy; VH Vitreous hemorrhage; PRP panretinal laser photocoagulation; IVK intravitreal kenalog; VMT vitreomacular traction; MH Macular hole;  NVD neovascularization of the disc; NVE neovascularization elsewhere; AREDS age related eye disease study; ARMD age related macular degeneration; POAG primary open angle glaucoma; EBMD epithelial/anterior basement membrane dystrophy; ACIOL anterior chamber intraocular lens; IOL intraocular lens; PCIOL posterior chamber intraocular lens; Phaco/IOL phacoemulsification with intraocular lens placement; [place] photorefractive keratectomy; LASIK laser assisted in situ keratomileusis; HTN hypertension; DM diabetes mellitus; COPD chronic obstructive pulmonary disease

## 2019-11-06 NOTE — Assessment & Plan Note (Signed)
OD, improved over the last 8 weeks, repeat injection OD today intravitreal Eylea

## 2019-11-07 ENCOUNTER — Telehealth (HOSPITAL_COMMUNITY): Payer: Self-pay

## 2019-11-07 NOTE — Telephone Encounter (Signed)
Right-sided chest pain that is persistent is less likely to be cardiac ischemia.  Think it would be reasonable for her to get a CXR and ECG, may be able to do through the medical staff at [facility].

## 2019-11-07 NOTE — Telephone Encounter (Signed)
Patient called to report right sided chest pain. States pain started in AM when she stood up from sitting, and has persisted all day. Unable to describe pain, states it does not radiate. Denies nausea/vomiting and dyspnea, states nothing relieves or worsens the pain. Clinic nurse took vitals and they were stable: BP 122/80, P 79, Resp 20, Temp 98.1, O2 95%. Advised patient to report to ER for further evaluation. Patient states that she does not want to go there and "wait with everything going on". Pt states she will contact PCP for further instructions. Will also forward to Dr. Aundra Dubin for any other recommendations

## 2019-11-08 NOTE — Telephone Encounter (Signed)
Called patient and they stated that chest pain resolved and she is "back to normal". Pt refused EKG and CXR. Advised patient that if pain returned to call us. Pt verbalized understanding

## 2019-11-09 ENCOUNTER — Other Ambulatory Visit: Payer: Self-pay | Admitting: Internal Medicine

## 2019-11-09 DIAGNOSIS — H60502 Unspecified acute noninfective otitis externa, left ear: Secondary | ICD-10-CM | POA: Diagnosis not present

## 2019-11-09 DIAGNOSIS — R040 Epistaxis: Secondary | ICD-10-CM | POA: Diagnosis not present

## 2019-11-09 DIAGNOSIS — H903 Sensorineural hearing loss, bilateral: Secondary | ICD-10-CM | POA: Diagnosis not present

## 2019-11-09 NOTE — Telephone Encounter (Signed)
Patient returned call. She confirmed she takes 1 of the 100 mg Gabapentin capsules daily and 50 mg Tramadol tab 1 at bedtime.   [employer]

## 2019-11-09 NOTE — Telephone Encounter (Signed)
Left message on voicemail for patient to return call when available    Reason for call, need to clarify dose. Requested dose differs from current medication list

## 2019-11-15 ENCOUNTER — Other Ambulatory Visit (HOSPITAL_COMMUNITY): Payer: Self-pay

## 2019-11-15 ENCOUNTER — Other Ambulatory Visit (HOSPITAL_COMMUNITY): Payer: Self-pay | Admitting: Cardiology

## 2019-11-15 MED ORDER — METOPROLOL SUCCINATE ER 25 MG PO TB24
25.0000 mg | ORAL_TABLET | Freq: Two times a day (BID) | ORAL | 11 refills | Status: DC
Start: 2019-11-15 — End: 2020-03-28

## 2019-11-22 ENCOUNTER — Other Ambulatory Visit: Payer: Self-pay | Admitting: *Deleted

## 2019-11-22 DIAGNOSIS — M545 Low back pain, unspecified: Secondary | ICD-10-CM

## 2019-11-22 MED ORDER — FENTANYL 12 MCG/HR TD PT72: MEDICATED_PATCH | TRANSDERMAL | 0 refills | Status: DC

## 2019-11-22 NOTE — Telephone Encounter (Signed)
Patient requested refill Epic LR: 10/22/2019 Contract on File.  Pended Rx and sent to Dr. Mariea Clonts for approval.

## 2019-12-03 ENCOUNTER — Encounter: Payer: Self-pay | Admitting: Family Medicine

## 2019-12-05 ENCOUNTER — Non-Acute Institutional Stay: Payer: Medicare Other | Admitting: Internal Medicine

## 2019-12-05 ENCOUNTER — Encounter: Payer: Self-pay | Admitting: Internal Medicine

## 2019-12-05 ENCOUNTER — Other Ambulatory Visit: Payer: Self-pay

## 2019-12-05 VITALS — BP 122/82 | HR 74 | Temp 97.2°F | Ht 60.0 in | Wt 108.0 lb

## 2019-12-05 DIAGNOSIS — Z87898 Personal history of other specified conditions: Secondary | ICD-10-CM | POA: Diagnosis not present

## 2019-12-05 DIAGNOSIS — R1011 Right upper quadrant pain: Secondary | ICD-10-CM | POA: Diagnosis not present

## 2019-12-05 DIAGNOSIS — K4190 Unilateral femoral hernia, without obstruction or gangrene, not specified as recurrent: Secondary | ICD-10-CM

## 2019-12-05 NOTE — Progress Notes (Signed)
Location:  [facility] clinic Provider: Madysen Faircloth L. Mariea Clonts, D.O., C.M.D.  Code Status: DNR Goals of Care:  Advanced Directives 12/05/2019  Does Patient Have a Medical Advance Directive? Yes  Type of Advance Directive Living will;Out of facility DNR (pink MOST or yellow form)  Does patient want to make changes to medical advance directive? No - Patient declined  Copy of [place] in Chart? -  Would patient like information on creating a medical advance directive? -  Pre-existing out of facility DNR order (yellow form or pink MOST form) Pink MOST/Yellow Form most recent copy in chart - Physician notified to receive inpatient order     Chief Complaint  Patient presents with  . Acute Visit    Stomach pain close to top part of navel 2 days ago. 1 case of Diarrhea this week     HPI: Patient is a 86 y.o. female seen today for an acute visit for stomach pain near her navel "2 days ago".  Notes indicate 10/20, she had c/o Nausea, severe stomach pain & congestion, runny nose and body aches on 10/20--respiratory symptoms and aches went away after a day. COVID Rapid Test obtained and results were negative. Instructed resident to hydrate and get rest. Resident aware to call for increased pain or new changes in condition."  10/26:  "Resident states that she has been having severe stomach pain with nausea over past several days, it's a little improved today after taking several doses of tylenol.  Has appt with Dr. Mariea Clonts 10/27."  She had one diarrhea right when the pain started 2 days ago.  Pain continued until early morning day before yesterday, she thinks.  Timeline for this overall is not clear.   She's never had a colonoscopy and is not aware of ever having diverticulitis or osis.  She still has a gallbladder intact, as well.  She has some very minimal tenderness remaining in her RUQ just beneath her ribs.  B/w the bouts of pain and diarrhea, she's moved her bowels like normal.  She's "fine"  right now.    Past Medical History:  Diagnosis Date  . Abnormal stress test    a. 11/2011 Ex MV: EF 80%, small, partially reversible anteroapical defect->mild ischemia vs attenuation-->med Rx.  [name] Kitchen Alopecia, unspecified   . Arthritis   . Chronic diastolic CHF (congestive heart failure) ([place])    a. 11/2013 Echo: EF 60-65%, no rwma, mild TR, PASP 52mmHg.  . Closed fracture of lower end of radius with ulna   . Deafness    Left, s/p multiple surgeries  . Essential hypertension   . Gouty arthropathy, unspecified   . History of kidney stones   . Hypothyroidism   . Insomnia, unspecified   . Macular degeneration    Left, s/p inj. tx  . Mitral valve disorders(424.0)   . Neck mass    right, w/u including MRI negative  . PAF (paroxysmal atrial fibrillation) (HCC)    a. on amio (02/2012 mild obstruction on PFT's) and xarelto.  . Polycythemia vera(238.4)   . Pure hypercholesterolemia   . Senile osteoporosis    Reclast in the past  . Tricuspid valve disorders, specified as nonrheumatic     Past Surgical History:  Procedure Laterality Date  . BREAST BIOPSY  06/18/1998   left  . CARDIOVERSION N/A 07/15/2017   Procedure: CARDIOVERSION;  Surgeon: Larey Dresser, MD;  Location: [facility] ENDOSCOPY;  Service: Cardiovascular;  Laterality: N/A;  . CARDIOVERSION N/A 07/21/2018   Procedure:  CARDIOVERSION;  Surgeon: Larey Dresser, MD;  Location: [facility] ENDOSCOPY;  Service: Cardiovascular;  Laterality: N/A;  . CARDIOVERSION N/A 10/06/2018   Procedure: CARDIOVERSION;  Surgeon: Larey Dresser, MD;  Location: [facility] ENDOSCOPY;  Service: Cardiovascular;  Laterality: N/A;  . ELECTROPHYSIOLOGIC STUDY N/A 01/08/2016   Procedure: Atrial Fibrillation Ablation;  Surgeon: Thompson Grayer, MD;  Location: [place] CV LAB;  Service: Cardiovascular;  Laterality: N/A;  . FEMUR IM NAIL Right 10/08/2015   Procedure: INTRAMEDULLARY (IM) NAIL FEMORAL RIGHT;  Surgeon: Paralee Cancel, MD;  Location: [facility] ORS;  Service: Orthopedics;   Laterality: Right;  . MASS EXCISION Right 11/26/2016   Procedure: EXCISION RIGHT LATERAL NECK MASS;  Surgeon: Jerrell Belfast, MD;  Location: [place];  Service: ENT;  Laterality: Right;  . TEE WITHOUT CARDIOVERSION N/A 07/15/2017   Procedure: TRANSESOPHAGEAL ECHOCARDIOGRAM (TEE);  Surgeon: Larey Dresser, MD;  Location: [facility] ENDOSCOPY;  Service: Cardiovascular;  Laterality: N/A;  . TONSILLECTOMY    . TOTAL ABDOMINAL HYSTERECTOMY    . TYMPANOPLASTY Bilateral     Allergies  Allergen Reactions  . Akten [Lidocaine]   . Amlodipine Swelling and Other (See Comments)    Lower extremity swelling   . Augmentin [Amoxicillin-Pot Clavulanate] Other (See Comments)    Stomach Pain/cramps Did it involve swelling of the face/tongue/throat, SOB, or low BP? No Did it involve sudden or severe rash/hives, skin peeling, or any reaction on the inside of your mouth or nose? No Did you need to seek medical attention at a hospital or doctor's office? Unknown When did it last happen?unknown If all above answers are "NO", may proceed with cephalosporin use.     Outpatient Encounter Medications as of 12/05/2019  Medication Sig  . acetaminophen (TYLENOL) 325 MG tablet Take 650 mg by mouth every 4 (four) hours as needed.  [name] Kitchen ELIQUIS 2.5 MG TABS tablet TAKE 1 TABLET BY MOUTH TWICE DAILY.  . fentaNYL (DURAGESIC) 12 MCG/HR Place one patch onto the skin every 3 days. Remove old patch  . gabapentin (NEURONTIN) 100 MG capsule Take 1 capsule (100 mg total) by mouth daily.  . hydrALAZINE (APRESOLINE) 50 MG tablet Take 1.5 tablets (75 mg total) by mouth 3 (three) times daily. TAKE 1 1/2 TABLETS THREE TIMES DAILY.  . hydroxyurea (HYDREA) 500 MG capsule 2 capsules daily from Mondays to Fridays and 1 capsule daily on Saturdays and Sunday  . levothyroxine (SYNTHROID) 100 MCG tablet TAKE 1 TABLET ONCE DAILY BEFORE BREAKFAST.  . Magnesium 250 MG TABS Take 250 mg by mouth daily.   . Melatonin 10 MG TABS Take 10 mg by mouth  at bedtime.  . metoprolol succinate (TOPROL-XL) 25 MG 24 hr tablet Take 1 tablet (25 mg total) by mouth 2 (two) times daily.  . Multiple Vitamins-Minerals (PRESERVISION AREDS 2) CAPS Take 1 capsule by mouth 2 (two) times daily.   . sennosides-docusate sodium (SENOKOT-S) 8.6-50 MG tablet Take 2 tablets by mouth 2 (two) times daily.  . sodium chloride (OCEAN) 0.65 % SOLN nasal spray Place 2 sprays into both nostrils 2 (two) times daily.   [name] Kitchen torsemide (DEMADEX) 20 MG tablet Take 4 tablets (80 mg total) by mouth in the morning AND 2 tablets (40 mg total) every evening.  . traMADol (ULTRAM) 50 MG tablet TAKE ONE TABLET AT BEDTIME.   No facility-administered encounter medications on file as of 12/05/2019.    Review of Systems:  Review of Systems  Constitutional: Negative for chills, fever and malaise/fatigue.  HENT: Negative for congestion and sore  throat.   Eyes: Positive for blurred vision.  Respiratory: Negative for cough and shortness of breath.   Cardiovascular: Negative for chest pain, palpitations and leg swelling.  Gastrointestinal: Positive for abdominal pain, diarrhea and nausea. Negative for blood in stool, constipation, heartburn, melena and vomiting.  Genitourinary: Negative for dysuria.  Musculoskeletal: Positive for back pain.  Skin: Negative for itching and rash.  Neurological: Negative for dizziness and loss of consciousness.  Endo/Heme/Allergies: Bruises/bleeds easily.  Psychiatric/Behavioral: Negative for depression. The patient is not nervous/anxious and does not have insomnia.     Health Maintenance  Topic Date Due  . INFLUENZA VACCINE  09/09/2019  . TETANUS/TDAP  01/13/2027  . DEXA SCAN  Completed  . COVID-19 Vaccine  Completed  . PNA vac Low Risk Adult  Completed    Physical Exam: Vitals:   12/05/19 1454  BP: 122/82  Pulse: 74  Temp: (!) 97.2 F (36.2 C)  TempSrc: Temporal  SpO2: 93%  Weight: 108 lb (49 kg)  Height: 5' (1.524 m)   Body mass index is  21.09 kg/m. Physical Exam Vitals reviewed.  Constitutional:      General: She is not in acute distress.    Appearance: Normal appearance. She is not toxic-appearing.  HENT:     Head: Normocephalic and atraumatic.  Cardiovascular:     Rate and Rhythm: Rhythm irregular.  Pulmonary:     Effort: Pulmonary effort is normal.     Breath sounds: Normal breath sounds.  Abdominal:     General: Bowel sounds are normal. There is distension.     Palpations: Abdomen is soft.     Tenderness: There is abdominal tenderness. There is no right CVA tenderness, left CVA tenderness, guarding or rebound.     Hernia: A hernia is present.     Comments: Right femoral hernia, reducible, nontender normal in color; very slight tenderness just under right anterior ribs  Musculoskeletal:        General: Normal range of motion.     Right lower leg: No edema.     Left lower leg: No edema.     Comments: Ambulating with rolling walker with skis  Skin:    General: Skin is warm and dry.  Neurological:     General: No focal deficit present.     Mental Status: She is alert and oriented to person, place, and time. Mental status is at baseline.  Psychiatric:        Mood and Affect: Mood normal.        Behavior: Behavior normal.     Labs reviewed: Basic Metabolic Panel: Recent Labs    06/19/19 1435 08/20/19 1115 10/05/19 1243  NA 140 137 137  K 3.8 3.9 3.6  CL 98 97* 96*  CO2 31 29 31   GLUCOSE 89 83 80  BUN 50* 37* 40*  CREATININE 1.41* 1.16* 1.22*  CALCIUM 10.9* 10.5* 10.3   Liver Function Tests: Recent Labs    04/09/19 0129 06/19/19 1435  AST 20 17  ALT 16 19  ALKPHOS 86 48  BILITOT 1.1 1.1  PROT 7.2 6.6  ALBUMIN 4.9 4.2   No results for input(s): LIPASE, AMYLASE in the last 8760 hours. No results for input(s): AMMONIA in the last 8760 hours. CBC: Recent Labs    05/25/19 1118 06/19/19 1435 06/22/19 0948 09/25/19 1331 10/05/19 1243  WBC 8.9   < > 7.4 7.6 6.0  NEUTROABS 6.9  --  5.4  6.0  --   HGB 11.5*   < >  12.1 12.4 11.3*  HCT 34.5*   < > 37.2 37.8 34.3*  MCV 115.4*   < > 118.5* 116.7* 119.5*  PLT 450*   < > 435* 372 298   < > = values in this interval not displayed.   Lipid Panel: No results for input(s): CHOL, HDL, LDLCALC, TRIG, CHOLHDL, LDLDIRECT in the last 8760 hours. No results found for: HGBA1C  Procedures since last visit: Intravitreal Injection, Pharmacologic Agent - OD - Right Eye  Result Date: 11/06/2019 Time Out 11/06/2019. 4:24 PM. Confirmed correct patient, procedure, site, and patient consented. Anesthesia Topical anesthesia was used. Anesthetic medications included Lidocaine 4%. Procedure Preparation included 10% betadine to eyelids, 5% betadine to ocular surface, Tobramycin 0.3%. A 30 gauge needle was used. Injection: 2 mg aflibercept Alfonse Flavors) SOLN   NDC: [id number], Lot: [id number]   Route: Intravitreal, Site: Right Eye, Waste: 0 mg Post-op Post injection exam found visual acuity of at least counting fingers. The patient tolerated the procedure well. There were no complications. The patient received written and verbal post procedure care education. Notes Lidocaine topically applied inferotemporal with sterile Q-tip.  OCT, Retina - OU - Both Eyes  Result Date: 11/06/2019 Right Eye Quality was good. Scan locations included subfoveal. Central Foveal Thickness: 217. Findings include disciform scar, subretinal scarring, outer retinal atrophy. Left Eye Quality was good. Scan locations included subfoveal. Central Foveal Thickness: 345. Progression has been stable. Findings include disciform scar, choroidal neovascular membrane, outer retinal atrophy, central retinal atrophy. Notes OD, region nasal and temporal to the fovea with much less intraretinal fluid.  Currently at 8 week follow-up.  OD OS, disciform subfoveal scar with large region inferotemporal of subretinal fluid and hemorrhage now improved currently at 40 days post injection injection interval    Assessment/Plan 1. RUQ abdominal pain - only slight tenderness remains; however, she's got her gallbladder so don't want to miss any gallstones there - suspect problem may have been an episode of diverticulitis so obtain imaging to evaluate further in case this recurs - CT Abdomen Pelvis Wo Contrast; Future  2. H/O diarrhea - has resolved, ? Diverticular disease vs partial blockage that resolved (typically she's constipated) vs gallstones -hard to know when most symptoms resolved - CT Abdomen Pelvis Wo Contrast; Future  3. Femoral hernia of right side -not new, was not area of pain when she had it either, CT Abdomen Pelvis Wo Contrast; Future  Labs/tests ordered:  Cbc, bmp, hepatic functions, amylase and lipase in am Next appt:  12/25/2019  Marianne Golightly L. Rainee Sweatt, D.O. [facility] [address], [place] [address] Cell Phone (Mon-Fri 8am-5pm):  [phone] On Call:  [phone] & follow prompts after 5pm & weekends Office Phone:  [phone] Office Fax:  [phone]

## 2019-12-06 ENCOUNTER — Telehealth: Payer: Self-pay | Admitting: Internal Medicine

## 2019-12-06 ENCOUNTER — Encounter: Payer: Medicare Other | Admitting: Family

## 2019-12-06 DIAGNOSIS — E039 Hypothyroidism, unspecified: Secondary | ICD-10-CM | POA: Diagnosis not present

## 2019-12-06 DIAGNOSIS — G894 Chronic pain syndrome: Secondary | ICD-10-CM | POA: Diagnosis not present

## 2019-12-06 DIAGNOSIS — I10 Essential (primary) hypertension: Secondary | ICD-10-CM | POA: Diagnosis not present

## 2019-12-06 DIAGNOSIS — E7800 Pure hypercholesterolemia, unspecified: Secondary | ICD-10-CM | POA: Diagnosis not present

## 2019-12-06 LAB — CBC AND DIFFERENTIAL
HCT: 36 (ref 36–46)
Hemoglobin: 11.6 — AB (ref 12.0–16.0)
Platelets: 343 (ref 150–399)
WBC: 5.9

## 2019-12-06 LAB — COMPREHENSIVE METABOLIC PANEL: Calcium: 10.3 (ref 8.7–10.7)

## 2019-12-06 LAB — BASIC METABOLIC PANEL
BUN: 38 — AB (ref 4–21)
CO2: 30 — AB (ref 13–22)
Chloride: 95 — AB (ref 99–108)
Creatinine: 1.1 (ref 0.5–1.1)
Glucose: 106
Potassium: 3.8 (ref 3.4–5.3)
Sodium: 137 (ref 137–147)

## 2019-12-06 LAB — CBC: RBC: 3.03 — AB (ref 3.87–5.11)

## 2019-12-06 NOTE — Telephone Encounter (Signed)
If Monday is the soonest they can do and they have to put her as a walk-in, that's fine if that works for the patient.

## 2019-12-06 NOTE — Telephone Encounter (Signed)
Sarah Mays called asking an abdomin and pelvis without contrast and it was ordered for 10-29 but they do not have any appts for tomorrow. She said do you want them to just put her as a walk in or can they schedule her an appt for this coming up Monday?  [phone]

## 2019-12-07 ENCOUNTER — Encounter: Payer: Medicare Other | Admitting: Family

## 2019-12-14 ENCOUNTER — Ambulatory Visit
Admission: RE | Admit: 2019-12-14 | Discharge: 2019-12-14 | Disposition: A | Discharge status: Home / Self Care | Payer: Medicare Other | Source: Ambulatory Visit | Attending: Internal Medicine | Admitting: Internal Medicine

## 2019-12-14 ENCOUNTER — Other Ambulatory Visit: Payer: Self-pay

## 2019-12-14 DIAGNOSIS — N281 Cyst of kidney, acquired: Secondary | ICD-10-CM | POA: Diagnosis not present

## 2019-12-14 DIAGNOSIS — K439 Ventral hernia without obstruction or gangrene: Secondary | ICD-10-CM | POA: Diagnosis not present

## 2019-12-14 DIAGNOSIS — Z87898 Personal history of other specified conditions: Secondary | ICD-10-CM

## 2019-12-14 DIAGNOSIS — K4190 Unilateral femoral hernia, without obstruction or gangrene, not specified as recurrent: Secondary | ICD-10-CM

## 2019-12-14 DIAGNOSIS — R1011 Right upper quadrant pain: Secondary | ICD-10-CM

## 2019-12-14 DIAGNOSIS — K8020 Calculus of gallbladder without cholecystitis without obstruction: Secondary | ICD-10-CM | POA: Diagnosis not present

## 2019-12-14 DIAGNOSIS — N2889 Other specified disorders of kidney and ureter: Secondary | ICD-10-CM | POA: Diagnosis not present

## 2019-12-16 ENCOUNTER — Encounter (HOSPITAL_COMMUNITY): Payer: Self-pay

## 2019-12-17 ENCOUNTER — Encounter: Payer: Self-pay | Admitting: Internal Medicine

## 2019-12-17 NOTE — Progress Notes (Signed)
No acute problems in CT. Has stones in right kidney that are chronic. Does have some gallstones, but no inflammation. Hernia is stable. There are concerns about the base of her right lung.  Radiology is suggesting a chest xray be done next to be sure there is no pneumonia.  How is she feeling? Current cough? Shortness of breath?

## 2019-12-18 ENCOUNTER — Encounter (INDEPENDENT_AMBULATORY_CARE_PROVIDER_SITE_OTHER): Payer: Medicare Other | Admitting: Ophthalmology

## 2019-12-18 ENCOUNTER — Other Ambulatory Visit: Payer: Self-pay | Admitting: Internal Medicine

## 2019-12-18 ENCOUNTER — Encounter: Payer: Self-pay | Admitting: Internal Medicine

## 2019-12-18 DIAGNOSIS — R059 Cough, unspecified: Secondary | ICD-10-CM

## 2019-12-18 NOTE — Progress Notes (Signed)
Ideally, I think we should do the xray at [facility] b/c the portable xrays are not as good.  I will place an order for a 2-view CXR.  She can then "walk in" at [facility] at her earliest convenience.

## 2019-12-19 ENCOUNTER — Other Ambulatory Visit: Payer: Self-pay | Admitting: Internal Medicine

## 2019-12-19 ENCOUNTER — Other Ambulatory Visit: Payer: Self-pay

## 2019-12-19 ENCOUNTER — Ambulatory Visit
Admission: RE | Admit: 2019-12-19 | Discharge: 2019-12-19 | Disposition: A | Discharge status: Home / Self Care | Payer: Medicare Other | Source: Ambulatory Visit | Attending: Internal Medicine | Admitting: Internal Medicine

## 2019-12-19 DIAGNOSIS — R059 Cough, unspecified: Secondary | ICD-10-CM | POA: Diagnosis not present

## 2019-12-20 ENCOUNTER — Other Ambulatory Visit: Payer: Self-pay

## 2019-12-20 MED ORDER — AZITHROMYCIN 250 MG PO TABS
250.0000 mg | ORAL_TABLET | Freq: Every day | ORAL | 0 refills | Status: DC
Start: 2019-12-20 — End: 2020-01-01

## 2019-12-20 MED ORDER — CEFDINIR 300 MG PO CAPS
300.0000 mg | ORAL_CAPSULE | Freq: Two times a day (BID) | ORAL | 0 refills | Status: DC
Start: 2019-12-20 — End: 2020-01-01

## 2019-12-20 NOTE — Progress Notes (Signed)
Please send in: Cefdinir 300mg  po q12 h x 5 days Zithromax 250mg  2 tablets today, then 1 tablet daily x 4 more days. For pneumonia.

## 2019-12-24 ENCOUNTER — Other Ambulatory Visit: Payer: Self-pay | Admitting: *Deleted

## 2019-12-24 ENCOUNTER — Encounter: Payer: Self-pay | Admitting: Internal Medicine

## 2019-12-24 DIAGNOSIS — M545 Low back pain, unspecified: Secondary | ICD-10-CM

## 2019-12-24 MED ORDER — FENTANYL 12 MCG/HR TD PT72: MEDICATED_PATCH | TRANSDERMAL | 0 refills | Status: DC

## 2019-12-24 NOTE — Telephone Encounter (Signed)
Patient requested refill Epic LR: 11/22/2019 Contract on file Pended Rx and sent to Dr. Mariea Clonts for approval.

## 2019-12-25 ENCOUNTER — Other Ambulatory Visit: Payer: Self-pay

## 2019-12-25 ENCOUNTER — Encounter: Payer: Self-pay | Admitting: Nurse Practitioner

## 2019-12-25 ENCOUNTER — Ambulatory Visit (INDEPENDENT_AMBULATORY_CARE_PROVIDER_SITE_OTHER): Payer: Medicare Other | Admitting: Nurse Practitioner

## 2019-12-25 ENCOUNTER — Telehealth: Payer: Self-pay

## 2019-12-25 DIAGNOSIS — E2839 Other primary ovarian failure: Secondary | ICD-10-CM

## 2019-12-25 DIAGNOSIS — Z0000 Encounter for general adult medical examination without abnormal findings: Secondary | ICD-10-CM

## 2019-12-25 NOTE — Patient Instructions (Signed)
Sarah Mays , Thank you for taking time to come for your Medicare Wellness Visit. I appreciate your ongoing commitment to your health goals. Please review the following plan we discussed and let me know if I can assist you in the future.   Screening recommendations/referrals: Colonoscopy aged out Mammogram aged out Bone Density OVERDUE- call to schedule [phone] Recommended yearly ophthalmology/optometry visit for glaucoma screening and checkup Recommended yearly dental visit for hygiene and checkup  Vaccinations: Influenza vaccine up to date Pneumococcal vaccine up to date Tdap vaccine up to date Shingles vaccine up to date    Advanced directives: on file  Conditions/risks identified: debility, memory loss, hearing and vision loss  Next appointment: 1 year   Preventive Care 35 Years and Older, Female Preventive care refers to lifestyle choices and visits with your health care provider that can promote health and wellness. What does preventive care include?  A yearly physical exam. This is also called an annual well check.  Dental exams once or twice a year.  Routine eye exams. Ask your health care provider how often you should have your eyes checked.  Personal lifestyle choices, including:  Daily care of your teeth and gums.  Regular physical activity.  Eating a healthy diet.  Avoiding tobacco and drug use.  Limiting alcohol use.  Practicing safe sex.  Taking low-dose aspirin every day.  Taking vitamin and mineral supplements as recommended by your health care provider. What happens during an annual well check? The services and screenings done by your health care provider during your annual well check will depend on your age, overall health, lifestyle risk factors, and family history of disease. Counseling  Your health care provider may ask you questions about your:  Alcohol use.  Tobacco use.  Drug use.  Emotional well-being.  Home and  relationship well-being.  Sexual activity.  Eating habits.  History of falls.  Memory and ability to understand (cognition).  Work and work Statistician.  Reproductive health. Screening  You may have the following tests or measurements:  Height, weight, and BMI.  Blood pressure.  Lipid and cholesterol levels. These may be checked every 5 years, or more frequently if you are over 31 years old.  Skin check.  Lung cancer screening. You may have this screening every year starting at age 46 if you have a 30-pack-year history of smoking and currently smoke or have quit within the past 15 years.  Fecal occult blood test (FOBT) of the stool. You may have this test every year starting at age 70.  Flexible sigmoidoscopy or colonoscopy. You may have a sigmoidoscopy every 5 years or a colonoscopy every 10 years starting at age 33.  Hepatitis C blood test.  Hepatitis B blood test.  Sexually transmitted disease (STD) testing.  Diabetes screening. This is done by checking your blood sugar (glucose) after you have not eaten for a while (fasting). You may have this done every 1-3 years.  Bone density scan. This is done to screen for osteoporosis. You may have this done starting at age 20.  Mammogram. This may be done every 1-2 years. Talk to your health care provider about how often you should have regular mammograms. Talk with your health care provider about your test results, treatment options, and if necessary, the need for more tests. Vaccines  Your health care provider may recommend certain vaccines, such as:  Influenza vaccine. This is recommended every year.  Tetanus, diphtheria, and acellular pertussis (Tdap, Td) vaccine. You may need a  Td booster every 10 years.  Zoster vaccine. You may need this after age 18.  Pneumococcal 13-valent conjugate (PCV13) vaccine. One dose is recommended after age 86.  Pneumococcal polysaccharide (PPSV23) vaccine. One dose is recommended after  age 15. Talk to your health care provider about which screenings and vaccines you need and how often you need them. This information is not intended to replace advice given to you by your health care provider. Make sure you discuss any questions you have with your health care provider. Document Released: 02/21/2015 Document Revised: 10/15/2015 Document Reviewed: 11/26/2014 Elsevier Interactive Patient Education  2017 [employer] Prevention in the Home Falls can cause injuries. They can happen to people of all ages. There are many things you can do to make your home safe and to help prevent falls. What can I do on the outside of my home?  Regularly fix the edges of walkways and driveways and fix any cracks.  Remove anything that might make you trip as you walk through a door, such as a raised step or threshold.  Trim any bushes or trees on the path to your home.  Use bright outdoor lighting.  Clear any walking paths of anything that might make someone trip, such as rocks or tools.  Regularly check to see if handrails are loose or broken. Make sure that both sides of any steps have handrails.  Any raised decks and porches should have guardrails on the edges.  Have any leaves, snow, or ice cleared regularly.  Use sand or salt on walking paths during winter.  Clean up any spills in your garage right away. This includes oil or grease spills. What can I do in the bathroom?  Use night lights.  Install grab bars by the toilet and in the tub and shower. Do not use towel bars as grab bars.  Use non-skid mats or decals in the tub or shower.  If you need to sit down in the shower, use a plastic, non-slip stool.  Keep the floor dry. Clean up any water that spills on the floor as soon as it happens.  Remove soap buildup in the tub or shower regularly.  Attach bath mats securely with double-sided non-slip rug tape.  Do not have throw rugs and other things on the floor that can  make you trip. What can I do in the bedroom?  Use night lights.  Make sure that you have a light by your bed that is easy to reach.  Do not use any sheets or blankets that are too big for your bed. They should not hang down onto the floor.  Have a firm chair that has side arms. You can use this for support while you get dressed.  Do not have throw rugs and other things on the floor that can make you trip. What can I do in the kitchen?  Clean up any spills right away.  Avoid walking on wet floors.  Keep items that you use a lot in easy-to-reach places.  If you need to reach something above you, use a strong step stool that has a grab bar.  Keep electrical cords out of the way.  Do not use floor polish or wax that makes floors slippery. If you must use wax, use non-skid floor wax.  Do not have throw rugs and other things on the floor that can make you trip. What can I do with my stairs?  Do not leave any items on the stairs.  Make sure that there are handrails on both sides of the stairs and use them. Fix handrails that are broken or loose. Make sure that handrails are as long as the stairways.  Check any carpeting to make sure that it is firmly attached to the stairs. Fix any carpet that is loose or worn.  Avoid having throw rugs at the top or bottom of the stairs. If you do have throw rugs, attach them to the floor with carpet tape.  Make sure that you have a light switch at the top of the stairs and the bottom of the stairs. If you do not have them, ask someone to add them for you. What else can I do to help prevent falls?  Wear shoes that:  Do not have high heels.  Have rubber bottoms.  Are comfortable and fit you well.  Are closed at the toe. Do not wear sandals.  If you use a stepladder:  Make sure that it is fully opened. Do not climb a closed stepladder.  Make sure that both sides of the stepladder are locked into place.  Ask someone to hold it for you,  if possible.  Clearly mark and make sure that you can see:  Any grab bars or handrails.  First and last steps.  Where the edge of each step is.  Use tools that help you move around (mobility aids) if they are needed. These include:  Canes.  Walkers.  Scooters.  Crutches.  Turn on the lights when you go into a dark area. Replace any light bulbs as soon as they burn out.  Set up your furniture so you have a clear path. Avoid moving your furniture around.  If any of your floors are uneven, fix them.  If there are any pets around you, be aware of where they are.  Review your medicines with your doctor. Some medicines can make you feel dizzy. This can increase your chance of falling. Ask your doctor what other things that you can do to help prevent falls. This information is not intended to replace advice given to you by your health care provider. Make sure you discuss any questions you have with your health care provider. Document Released: 11/21/2008 Document Revised: 07/03/2015 Document Reviewed: 03/01/2014 Elsevier Interactive Patient Education  2017 [employer].

## 2019-12-25 NOTE — Progress Notes (Signed)
This service is provided via telemedicine  No vital signs collected/recorded due to the encounter was a telemedicine visit.   Location of patient (ex: home, work):  Home  Patient consents to a telephone visit:  Yes, see encounter dated 12/25/2019  Location of the provider (ex: office, home):  [place]  Name of any referring provider:  Hollace Kinnier, DO  Names of all persons participating in the telemedicine service and their role in the encounter:  Sherrie Mustache, Nurse Practitioner, Carroll Kinds, CMA, and patient.   Time spent on call:  10 minutes with medical assistant

## 2019-12-25 NOTE — Telephone Encounter (Signed)
[name], alling are scheduled for a virtual visit with your provider today.    Just as we do with appointments in the office, we must obtain your consent to participate.  Your consent will be active for this visit and any virtual visit you may have with one of our providers in the next 365 days.    If you have a MyChart account, I can also send a copy of this consent to you electronically.  All virtual visits are billed to your insurance company just like a traditional visit in the office.  As this is a virtual visit, video technology does not allow for your provider to perform a traditional examination.  This may limit your provider's ability to fully assess your condition.  If your provider identifies any concerns that need to be evaluated in person or the need to arrange testing such as labs, EKG, etc, we will make arrangements to do so.    Although advances in technology are sophisticated, we cannot ensure that it will always work on either your end or our end.  If the connection with a video visit is poor, we may have to switch to a telephone visit.  With either a video or telephone visit, we are not always able to ensure that we have a secure connection.   I need to obtain your verbal consent now.   Are you willing to proceed with your visit today?   Sarah Mays has provided verbal consent on 12/25/2019 for a virtual visit (video or telephone).   Sarah Mays, [facility] 12/25/2019  2:31 PM

## 2019-12-25 NOTE — Progress Notes (Signed)
Subjective:   Sarah Mays is a 86 y.o. female who presents for Medicare Annual (Subsequent) preventive examination.  Review of Systems     Cardiac Risk Factors include: advanced age (>72men, >95 women);hypertension;family history of premature cardiovascular disease     Objective:    There were no vitals filed for this visit. There is no height or weight on file to calculate BMI.  Advanced Directives 12/05/2019 08/15/2019 05/23/2019 04/18/2019 12/04/2018 07/21/2018 06/07/2018  Does Patient Have a Medical Advance Directive? Yes Yes Yes Yes Yes Yes Yes  Type of Advance Directive Living will;Out of facility DNR (pink MOST or yellow form) [place];Out of facility DNR (pink MOST or yellow form) Out of facility DNR (pink MOST or yellow form);Healthcare Power of [employer] of facility DNR (pink MOST or yellow form);Healthcare Power of [place];Living will [place];Living will [place];Living will;Out of facility DNR (pink MOST or yellow form)  Does patient want to make changes to medical advance directive? No - Patient declined No - Patient declined No - Patient declined No - Patient declined No - Patient declined - No - Patient declined  Copy of [place] in Chart? - Yes - validated most recent copy scanned in chart (See row information) Yes - validated most recent copy scanned in chart (See row information) - No - copy requested - Yes - validated most recent copy scanned in chart (See row information)  Would patient like information on creating a medical advance directive? - - - - - - -  Pre-existing out of facility DNR order (yellow form or pink MOST form) Pink MOST/Yellow Form most recent copy in chart - Physician notified to receive inpatient order Yellow form placed in chart (order not valid for inpatient use) Pink MOST/Yellow Form most recent copy in chart - Physician notified to  receive inpatient order - - - Yellow form placed in chart (order not valid for inpatient use)    Current Medications (verified) Outpatient Encounter Medications as of 12/25/2019  Medication Sig   acetaminophen (TYLENOL) 325 MG tablet Take 650 mg by mouth every 4 (four) hours as needed.   azithromycin (ZITHROMAX) 250 MG tablet Take 1 tablet (250 mg total) by mouth daily. 2 tablets today then 1 tablet daily x 4 more days   cefdinir (OMNICEF) 300 MG capsule Take 1 capsule (300 mg total) by mouth 2 (two) times daily. PO q 12 x 5 days   ELIQUIS 2.5 MG TABS tablet TAKE 1 TABLET BY MOUTH TWICE DAILY.   fentaNYL (DURAGESIC) 12 MCG/HR Place one patch onto the skin every 3 days. Remove old patch   gabapentin (NEURONTIN) 100 MG capsule Take 1 capsule (100 mg total) by mouth daily.   hydrALAZINE (APRESOLINE) 50 MG tablet Take 1.5 tablets (75 mg total) by mouth 3 (three) times daily. TAKE 1 1/2 TABLETS THREE TIMES DAILY.   hydroxyurea (HYDREA) 500 MG capsule 2 capsules daily from Mondays to Fridays and 1 capsule daily on Saturdays and Sunday   levothyroxine (SYNTHROID) 100 MCG tablet TAKE 1 TABLET ONCE DAILY BEFORE BREAKFAST.   Magnesium 250 MG TABS Take 250 mg by mouth daily.    Melatonin 10 MG TABS Take 10 mg by mouth at bedtime.   metoprolol succinate (TOPROL-XL) 25 MG 24 hr tablet Take 1 tablet (25 mg total) by mouth 2 (two) times daily.   Multiple Vitamins-Minerals (PRESERVISION AREDS 2) CAPS Take 1 capsule by mouth 2 (  two) times daily.    sennosides-docusate sodium (SENOKOT-S) 8.6-50 MG tablet Take 2 tablets by mouth 2 (two) times daily.   sodium chloride (OCEAN) 0.65 % SOLN nasal spray Place 2 sprays into both nostrils 2 (two) times daily.    torsemide (DEMADEX) 20 MG tablet Take 4 tablets (80 mg total) by mouth in the morning AND 2 tablets (40 mg total) every evening.   traMADol (ULTRAM) 50 MG tablet TAKE ONE TABLET AT BEDTIME.   No facility-administered encounter medications on  file as of 12/25/2019.    Allergies (verified) Akten [lidocaine], Amlodipine, and Augmentin [amoxicillin-pot clavulanate]   History: Past Medical History:  Diagnosis Date   Abnormal stress test    a. 11/2011 Ex MV: EF 80%, small, partially reversible anteroapical defect->mild ischemia vs attenuation-->med Rx.   Alopecia, unspecified    Arthritis    Chronic diastolic CHF (congestive heart failure) ([place])    a. 11/2013 Echo: EF 60-65%, no rwma, mild TR, PASP 33mmHg.   Closed fracture of lower end of radius with ulna    Deafness    Left, s/p multiple surgeries   Essential hypertension    Gouty arthropathy, unspecified    History of kidney stones    Hypothyroidism    Insomnia, unspecified    Macular degeneration    Left, s/p inj. tx   Mitral valve disorders(424.0)    Neck mass    right, w/u including MRI negative   PAF (paroxysmal atrial fibrillation) ([place])    a. on amio (02/2012 mild obstruction on PFT's) and xarelto.   Pneumonia    Polycythemia vera(238.4)    Pure hypercholesterolemia    Senile osteoporosis    Reclast in the past   Tricuspid valve disorders, specified as nonrheumatic    Past Surgical History:  Procedure Laterality Date   BREAST BIOPSY  06/18/1998   left   CARDIOVERSION N/A 07/15/2017   Procedure: CARDIOVERSION;  Surgeon: Larey Dresser, MD;  [facility] END[facility]COPY;  Service: Cardiovascular;  Laterality: N/A;   CARDIOVERSION N/A 07/21/2018   Procedure: CARDIOVERSION;  Surgeon: Larey Dresser, MD;  Location: [facility] ENDOSCOPY;  Service: Cardiovascular;  Laterality: N/A;   CARDIOVERSION N/A 10/06/2018   Procedure: CARDIOVERSION;  Surgeon: Larey Dresser, MD;  Location: [facility] ENDOSCOPY;  Service: Cardiovascular;  Laterality: N/A;   ELECTROPHYSIOLOGIC STUDY N/A 01/08/2016   Procedure: Atrial Fibrillation Ablation;  Surgeon: Thompson Grayer, MD;  Location: [place] CV LAB;  Service: Cardiovascular;  Laterality: N/A;   FEMUR IM NAIL Right  10/08/2015   Procedure: INTRAMEDULLARY (IM) NAIL FEMORAL RIGHT;  Surgeon: Paralee Cancel, MD;  Location: [facility] ORS;  Service: Orthopedics;  Laterality: Right;   MASS EXCISION Right 11/26/2016   Procedure: EXCISION RIGHT LATERAL NECK MASS;  Surgeon: Jerrell Belfast, MD;  Location: [place];  Service: ENT;  Laterality: Right;   TEE WITHOUT CARDIOVERSION N/A 07/15/2017   Procedure: TRANSESOPHAGEAL ECHOCARDIOGRAM (TEE);  Surgeon: Larey Dresser, MD;  Location: [facility] ENDOSCOPY;  Service: Cardiovascular;  Laterality: N/A;   TONSILLECTOMY     TOTAL ABDOMINAL HYSTERECTOMY     TYMPANOPLASTY Bilateral    Family History  Problem Relation Age of Onset   Hypertension Father    Heart disease Father        patient does not know details.    Ovarian cancer Mother 97   Cancer Sister        colon   Heart disease Sister    Cancer Sister    Cancer Sister  hodgkin disease   Breast cancer Daughter    Cancer Daughter        breast   Social History   Socioeconomic History   Marital status: Married    Spouse name: [name]   Number of children: 2   Years of education: 50   Highest education level: Not on file  Occupational History   Not on file  Tobacco Use   Smoking status: Former Smoker    Types: Cigarettes    Quit date: 01/19/1984    Years since quitting: 35.9   Smokeless tobacco: Never Used  Vaping Use   Vaping Use: Never used  Substance and Sexual Activity   Alcohol use: Yes    Alcohol/week: 1.0 standard drink    Types: 1 Glasses of wine per week    Comment: Every evening    Drug use: No   Sexual activity: Not Currently  Other Topics Concern   Not on file  Social History Narrative   Patient is Married 1955. 2 kids. 40 grandkid-36 years old in 2016. College graduate [name] [place]).    Lives in apartment,  Independent Living  section at [place] since 02/2013.      Stay at home mother.       No Smoking history, Mod. alcohol use.   Patient  has a living will, POA      Hobbies: CPU and painting         Social Determinants of Health   Financial Resource Strain:    Difficulty of Paying Living Expenses: Not on file  Food Insecurity:    Worried About Charity fundraiser in the Last Year: Not on file   [employer] of Food in the Last Year: Not on file  Transportation Needs:    Lack of Transportation (Medical): Not on file   Lack of Transportation (Non-Medical): Not on file  Physical Activity:    Days of Exercise per Week: Not on file   Minutes of Exercise per Session: Not on file  Stress:    Feeling of Stress : Not on file  Social Connections:    Frequency of Communication with Friends and Family: Not on file   Frequency of Social Gatherings with Friends and Family: Not on file   Attends Religious Services: Not on file   Active Member of Clubs or Organizations: Not on file   Attends Archivist Meetings: Not on file   Marital Status: Not on file    Tobacco Counseling Counseling given: Not Answered   Clinical Intake:  Pre-visit preparation completed: Yes  Pain : No/denies pain     BMI - recorded: 21 Nutritional Status: BMI of 19-24  Normal Nutritional Risks: None Diabetes: No  How often do you need to have someone help you when you read instructions, pamphlets, or other written materials from your doctor or pharmacy?: 1 - Never  Diabetic?no         Activities of Daily Living In your present state of health, do you have any difficulty performing the following activities: 12/25/2019  Hearing? Y  Vision? Y  Difficulty concentrating or making decisions? Y  Walking or climbing stairs? Y  Comment uses walker, does not climb stairs  Dressing or bathing? N  Doing errands, shopping? N  Preparing Food and eating ? N  Using the Toilet? N  In the past six months, have you accidently leaked urine? N  Do you have problems with loss of bowel control? N  Managing  your Medications? N    Managing your Finances? N  Housekeeping or managing your Housekeeping? N  Some recent data might be hidden    Patient Care Team: Gayland Curry, DO as PCP - General (Geriatric Medicine) Larey Dresser, MD as PCP - Cardiology (Cardiology) [facility], [facility], Clent Demark, MD as Consulting Physician (Ophthalmology) Larey Dresser, MD as Consulting Physician (Cardiology) Heath Lark, MD as Consulting Physician (Hematology and Oncology) Clent Jacks, MD as Consulting Physician (Ophthalmology) Jerrell Belfast, MD as Consulting Physician (Otolaryngology)  Indicate any recent Medical Services you may have received from other than [facility] providers in the past year (date may be approximate).     Assessment:   This is a routine wellness examination for [place].  Hearing/Vision screen  Hearing Screening   125Hz  250Hz  500Hz  1000Hz  2000Hz  3000Hz  4000Hz  6000Hz  8000Hz   Right ear:           Left ear:           Comments: Patient wears hearing aids  Vision Screening Comments: Patient wears reading glasses. Patient had recent eye exam. Patient has macular degeneration. Patient sees Dr. Katy Fitch and retina doctor Dr. Zadie Rhine  Dietary issues and exercise activities discussed: Current Exercise Habits: The patient does not participate in regular exercise at present  Goals   None    Depression Screen PHQ 2/9 Scores 12/25/2019 08/15/2019 04/18/2019 02/14/2019 12/05/2018 09/27/2018 06/07/2018  PHQ - 2 Score 0 0 0 0 0 0 0    Fall Risk Fall Risk  12/25/2019 12/05/2019 08/15/2019 04/18/2019 02/14/2019  Falls in the past year? 0 0 0 0 0  Number falls in past yr: 0 0 0 0 0  Injury with Fall? 0 0 0 - 0  Risk for fall due to : - - - - -  Follow up - - - - -    Any stairs in or around the home? No  If so, are there any without handrails? No  Home free of loose throw rugs in walkways, pet beds, electrical cords, etc? Yes  Adequate lighting in your home to reduce risk of falls? Yes   ASSISTIVE  DEVICES UTILIZED TO PREVENT FALLS:  Life alert? Yes  Use of a cane, walker or w/c? Yes  Grab bars in the bathroom? Yes  Shower chair or bench in shower? No  Elevated toilet seat or a handicapped toilet? No   TIMED UP AND GO:  Was the test performed? No .    Cognitive Function: MMSE - Mini Mental State Exam 12/14/2016 08/06/2015  Orientation to time 5 4  Orientation to Place 5 5  Registration 3 3  Attention/ Calculation 5 5  Recall 3 3  Language- name 2 objects 2 2  Language- repeat 1 1  Language- follow 3 step command 3 3  Language- read & follow direction 1 1  Write a sentence 1 1  Copy design 1 1  Total score 30 29     6CIT Screen 12/25/2019 12/05/2018  What Year? 0 points 0 points  What month? 0 points 0 points  What time? 0 points 0 points  Count back from 20 0 points 0 points  Months in reverse 2 points 2 points  Repeat phrase 2 points 2 points  Total Score 4 4    Immunizations Immunization History  Administered Date(s) Administered   Influenza Inj Mdck Quad Pf 11/27/2015   Influenza, High Dose Seasonal PF 11/17/2018, 12/07/2019   Influenza,inj,Quad PF,6+ Mos 10/11/2013  Influenza-Unspecified 11/08/2012, 11/28/2014, 11/29/2016   Moderna SARS-COVID-2 Vaccination 02/20/2019, 03/20/2019   Pneumococcal Conjugate-13 11/06/2014   Pneumococcal Polysaccharide-23 10/18/2007   Tdap 01/12/2017   Zoster 06/27/2006   Zoster Recombinat (Shingrix) 01/12/2017, 03/17/2017    TDAP status: Up to date Flu Vaccine status: Up to date Pneumococcal vaccine status: Up to date Covid-19 vaccine status: Completed vaccines  Qualifies for Shingles Vaccine? Yes   Zostavax completed Yes   Shingrix Completed?: Yes  Screening Tests Health Maintenance  Topic Date Due   TETANUS/TDAP  01/13/2027   INFLUENZA VACCINE  Completed   DEXA SCAN  Completed   COVID-19 Vaccine  Completed   PNA vac Low Risk Adult  Completed    Health Maintenance  There are no preventive  care reminders to display for this patient.  Colorectal cancer screening: No longer required.  Mammogram status: No longer required.  Bone Density status: Completed 2017. Results reflect: Bone density results: OSTEOPOROSIS. Repeat every 2 years.  Lung Cancer Screening: (Low Dose CT Chest recommended if Age 20-80 years, 30 pack-year currently smoking OR have quit w/in 15years.) does not qualify.   Lung Cancer Screening Referral: na  Additional Screening:  Hepatitis C Screening: does not qualify; Completed na  Vision Screening: Recommended annual ophthalmology exams for early detection of glaucoma and other disorders of the eye. Is the patient up to date with their annual eye exam?  Yes  Who is the provider or what is the name of the office in which the patient attends annual eye exams? [facility] If pt is not established with a provider, would they like to be referred to a provider to establish care? No .   Dental Screening: Recommended annual dental exams for proper oral hygiene  Community Resource Referral / Chronic Care Management: CRR required this visit?  No   CCM required this visit?  No      Plan:     I have personally reviewed and noted the following in the patients chart:    Medical and social history  Use of alcohol, tobacco or illicit drugs   Current medications and supplements  Functional ability and status  Nutritional status  Physical activity  Advanced directives  List of other physicians  Hospitalizations, surgeries, and ER visits in previous 12 months  Vitals  Screenings to include cognitive, depression, and falls  Referrals and appointments  In addition, I have reviewed and discussed with patient certain preventive protocols, quality metrics, and best practice recommendations. A written personalized care plan for preventive services as well as general preventive health recommendations were provided to patient.     Lauree Chandler,  NP   12/25/2019    Virtual Visit via Telephone Note  I connected with@ on 12/25/19 at  3:15 PM EST by telephone and verified that I am speaking with the correct person using two identifiers.  Location: Patient: home Provider: [facility]   I discussed the limitations, risks, security and privacy concerns of performing an evaluation and management service by telephone and the availability of in person appointments. I also discussed with the patient that there may be a patient responsible charge related to this service. The patient expressed understanding and agreed to proceed.   I discussed the assessment and treatment plan with the patient. The patient was provided an opportunity to ask questions and all were answered. The patient agreed with the plan and demonstrated an understanding of the instructions.   The patient was advised to call back or seek an in-person evaluation  if the symptoms worsen or if the condition fails to improve as anticipated.  I provided 16 minutes of non-face-to-face time during this encounter.  Carlos American. Harle Battiest Avs printed and mailed

## 2019-12-26 ENCOUNTER — Encounter (HOSPITAL_COMMUNITY): Payer: Self-pay

## 2019-12-27 ENCOUNTER — Encounter: Payer: Self-pay | Admitting: Internal Medicine

## 2019-12-27 ENCOUNTER — Other Ambulatory Visit: Payer: Self-pay

## 2019-12-27 ENCOUNTER — Other Ambulatory Visit (HOSPITAL_COMMUNITY): Payer: Self-pay | Admitting: Cardiology

## 2019-12-27 ENCOUNTER — Ambulatory Visit: Payer: Medicare Other | Admitting: Hematology and Oncology

## 2019-12-27 ENCOUNTER — Ambulatory Visit (INDEPENDENT_AMBULATORY_CARE_PROVIDER_SITE_OTHER): Payer: Medicare Other | Admitting: Internal Medicine

## 2019-12-27 ENCOUNTER — Other Ambulatory Visit: Payer: Medicare Other

## 2019-12-27 VITALS — BP 118/62 | HR 69 | Temp 97.5°F | Ht 61.0 in | Wt 110.4 lb

## 2019-12-27 DIAGNOSIS — J189 Pneumonia, unspecified organism: Secondary | ICD-10-CM | POA: Diagnosis not present

## 2019-12-27 DIAGNOSIS — I5033 Acute on chronic diastolic (congestive) heart failure: Secondary | ICD-10-CM

## 2019-12-27 DIAGNOSIS — R5383 Other fatigue: Secondary | ICD-10-CM

## 2019-12-27 NOTE — Progress Notes (Signed)
Location:  [facility] clinic Provider: Curtisha Bendix L. Mariea Clonts, D.O., C.M.D.  Code Status: DNR Goals of Care:  Advanced Directives 12/27/2019  Does Patient Have a Medical Advance Directive? Yes  Type of Paramedic of [place];Out of facility DNR (pink MOST or yellow form);Living will  Does patient want to make changes to medical advance directive? No - Patient declined  Copy of [place] in Chart? Yes - validated most recent copy scanned in chart (See row information)  Would patient like information on creating a medical advance directive? -  Pre-existing out of facility DNR order (yellow form or pink MOST form) Pink MOST/Yellow Form most recent copy in chart - Physician notified to receive inpatient order   Chief Complaint  Patient presents with  . Acute Visit    Follow  up on pneumonia  status. Patient states she feels wiped out     HPI: Patient is a 86 y.o. female seen today for an acute visit for f/u on pneumonia.  She is fatigued.   She is still very short of breath.  She has not had much of a cough, more of a hack.  She did cough up yellow junk 2-3 times and that's it.  Appetite ok--she's eating three meals per day.  Her daughter arrived and noted that she was looking better than the previous days.  She does have considerable edema of both ankles and feet to where her shoes barely can fit on.  Cardiology has already been contacted and her diuretics temporarily increased to address this (just beginning today).  We agreed to checking f/u labs today for wbc and cr.    Past Medical History:  Diagnosis Date  . Abnormal stress test    a. 11/2011 Ex MV: EF 80%, small, partially reversible anteroapical defect->mild ischemia vs attenuation-->med Rx.  [name] Kitchen Alopecia, unspecified   . Arthritis   . Chronic diastolic CHF (congestive heart failure) ([place])    a. 11/2013 Echo: EF 60-65%, no rwma, mild TR, PASP 46mHg.  . Closed fracture of lower end of radius with ulna    . Deafness    Left, s/p multiple surgeries  . Essential hypertension   . Gouty arthropathy, unspecified   . History of kidney stones   . Hypothyroidism   . Insomnia, unspecified   . Macular degeneration    Left, s/p inj. tx  . Mitral valve disorders(424.0)   . Neck mass    right, w/u including MRI negative  . PAF (paroxysmal atrial fibrillation) (HCC)    a. on amio (02/2012 mild obstruction on PFT's) and xarelto.  . Pneumonia   . Polycythemia vera(238.4)   . Pure hypercholesterolemia   . Senile osteoporosis    Reclast in the past  . Tricuspid valve disorders, specified as nonrheumatic     Past Surgical History:  Procedure Laterality Date  . BREAST BIOPSY  06/18/1998   left  . CARDIOVERSION N/A 07/15/2017   Procedure: CARDIOVERSION;  Surgeon: MLarey Dresser MD;  [facility];  Service: Cardiovascular;  Laterality: N/A;  . CARDIOVERSION N/A 07/21/2018   Procedure: CARDIOVERSION;  Surgeon: MLarey Dresser MD;  [facility];  Service: Cardiovascular;  Laterality: N/A;  . CARDIOVERSION N/A 10/06/2018   Procedure: CARDIOVERSION;  Surgeon: MLarey Dresser MD;  [facility];  Service: Cardiovascular;  Laterality: N/A;  . ELECTROPHYSIOLOGIC STUDY N/A 01/08/2016   Procedure: Atrial Fibrillation Ablation;  Surgeon: JThompson Grayer MD;  [facility];  Service: Cardiovascular;  Laterality: N/A;  . FEMUR IM NAIL Right 10/08/2015   Procedure: INTRAMEDULLARY (IM) NAIL FEMORAL RIGHT;  Surgeon: Paralee Cancel, MD;  Location: [facility];  Service: Orthopedics;  Laterality: Right;  . MASS EXCISION Right 11/26/2016   Procedure: EXCISION RIGHT LATERAL NECK MASS;  Surgeon: Jerrell Belfast, MD;  Location: [place];  Service: ENT;  Laterality: Right;  . TEE WITHOUT CARDIOVERSION N/A 07/15/2017   Procedure: TRANSESOPHAGEAL ECHOCARDIOGRAM (TEE);  Surgeon: Larey Dresser, MD;  Location: [facility] END[facility]COPY;  Service: Cardiovascular;  Laterality: N/A;  . TONSILLECTOMY    .  TOTAL ABDOMINAL HYSTERECTOMY    . TYMPANOPLASTY Bilateral     Allergies  Allergen Reactions  . Akten [Lidocaine]   . Amlodipine Swelling and Other (See Comments)    Lower extremity swelling   . Augmentin [Amoxicillin-Pot Clavulanate] Other (See Comments)    Stomach Pain/cramps Did it involve swelling of the face/tongue/throat, SOB, or low BP? No Did it involve sudden or severe rash/hives, skin peeling, or any reaction on the inside of your mouth or nose? No Did you need to seek medical attention at a hospital or doctor's office? Unknown When did it last happen?unknown If all above answers are "NO", may proceed with cephalosporin use.     Outpatient Encounter Medications as of 12/27/2019  Medication Sig  . acetaminophen (TYLENOL) 325 MG tablet Take 650 mg by mouth every 4 (four) hours as needed.  [name] Kitchen azithromycin (ZITHROMAX) 250 MG tablet Take 1 tablet (250 mg total) by mouth daily. 2 tablets today then 1 tablet daily x 4 more days  . cefdinir (OMNICEF) 300 MG capsule Take 1 capsule (300 mg total) by mouth 2 (two) times daily. PO q 12 x 5 days  . ELIQUIS 2.5 MG TABS tablet TAKE 1 TABLET BY MOUTH TWICE DAILY.  . fentaNYL (DURAGESIC) 12 MCG/HR Place one patch onto the skin every 3 days. Remove old patch  . gabapentin (NEURONTIN) 100 MG capsule Take 1 capsule (100 mg total) by mouth daily.  . hydrALAZINE (APRESOLINE) 50 MG tablet Take 1.5 tablets (75 mg total) by mouth 3 (three) times daily. TAKE 1 1/2 TABLETS THREE TIMES DAILY.  . hydroxyurea (HYDREA) 500 MG capsule 2 capsules daily from Mondays to Fridays and 1 capsule daily on Saturdays and Sunday  . levothyroxine (SYNTHROID) 100 MCG tablet TAKE 1 TABLET ONCE DAILY BEFORE BREAKFAST.  . Magnesium 250 MG TABS Take 250 mg by mouth daily.   . Melatonin 10 MG TABS Take 10 mg by mouth at bedtime.  . metoprolol succinate (TOPROL-XL) 25 MG 24 hr tablet Take 1 tablet (25 mg total) by mouth 2 (two) times daily.  . Multiple  Vitamins-Minerals (PRESERVISION AREDS 2) CAPS Take 1 capsule by mouth 2 (two) times daily.   . sennosides-docusate sodium (SENOKOT-S) 8.6-50 MG tablet Take 2 tablets by mouth 2 (two) times daily.  . sodium chloride (OCEAN) 0.65 % SOLN nasal spray Place 2 sprays into both nostrils 2 (two) times daily.   [name] Kitchen torsemide (DEMADEX) 20 MG tablet Take 4 tablets (80 mg total) by mouth in the morning AND 2 tablets (40 mg total) every evening.  . traMADol (ULTRAM) 50 MG tablet TAKE ONE TABLET AT BEDTIME.   No facility-administered encounter medications on file as of 12/27/2019.    Review of Systems:  Review of Systems  Constitutional: Positive for malaise/fatigue. Negative for chills, fever and weight loss.  HENT: Positive for hearing loss. Negative for congestion and sore throat.   Eyes: Negative for blurred vision (macular).  Respiratory: Positive for cough, sputum production and shortness of breath.   Cardiovascular: Positive for leg swelling. Negative for chest pain and palpitations.  Gastrointestinal: Negative for abdominal pain, blood in stool, constipation and melena.       Bowels doing ok  Genitourinary: Negative for dysuria.  Musculoskeletal: Positive for back pain. Negative for falls and joint pain.  Skin: Negative for itching and rash.  Neurological: Negative for dizziness and loss of consciousness.  Endo/Heme/Allergies: Bruises/bleeds easily.  Psychiatric/Behavioral: Negative for depression. The patient is not nervous/anxious.     Health Maintenance  Topic Date Due  . TETANUS/TDAP  01/13/2027  . INFLUENZA VACCINE  Completed  . DEXA SCAN  Completed  . COVID-19 Vaccine  Completed  . PNA vac Low Risk Adult  Completed    Physical Exam: Vitals:   12/27/19 1102  BP: 118/62  Pulse: 69  Temp: (!) 97.5 F (36.4 C)  TempSrc: Temporal  SpO2: 91%  Weight: 110 lb 6.4 oz (50.1 kg)  Height: _0  (1.549 m)   Body mass index is 20.86 kg/m. Physical Exam Vitals reviewed.    Constitutional:      General: She is not in acute distress.    Appearance: Normal appearance. She is not ill-appearing or toxic-appearing.  HENT:     Head: Normocephalic and atraumatic.     Ears:     Comments: HOH though better than many times today    Nose: No congestion.  Eyes:     Extraocular Movements: Extraocular movements intact.     Pupils: Pupils are equal, round, and reactive to light.  Cardiovascular:     Rate and Rhythm: Rhythm irregular.  Pulmonary:     Effort: Pulmonary effort is normal.     Breath sounds: No wheezing, rhonchi or rales.     Comments: Diminished breath sounds, but no audible rhonchi or rales Abdominal:     General: Bowel sounds are normal. There is distension.     Tenderness: There is no abdominal tenderness. There is no guarding or rebound.  Musculoskeletal:        General: Normal range of motion.     Right lower leg: Edema present.     Left lower leg: Edema present.     Comments: 3+ edema of feet and ankles  Skin:    General: Skin is warm.  Neurological:     General: No focal deficit present.     Mental Status: She is alert and oriented to person, place, and time.  Psychiatric:        Mood and Affect: Mood normal.        Behavior: Behavior normal.     Labs reviewed: Basic Metabolic Panel: Recent Labs    06/19/19 1435 06/19/19 1435 08/20/19 1115 10/05/19 1243 12/06/19 0000  NA 140   < > 137 137 137  K 3.8   < > 3.9 3.6 3.8  CL 98   < > 97* 96* 95*  CO2 31   < > 29 31 30*  GLUCOSE 89  --  83 80  --   BUN 50*   < > 37* 40* 38*  CREATININE 1.41*   < > 1.16* 1.22* 1.1  CALCIUM 10.9*   < > 10.5* 10.3 10.3   < > = values in this interval not displayed.   Liver Function Tests: Recent Labs    04/09/19 0129 06/19/19 1435  AST 20 17  ALT 16 19  ALKPHOS 86 48  BILITOT 1.1 1.1  PROT  7.2 6.6  ALBUMIN 4.9 4.2   No results for input(s): LIPASE, AMYLASE in the last 8760 hours. No results for input(s): AMMONIA in the last 8760  hours. CBC: Recent Labs    05/25/19 1118 06/19/19 1435 06/22/19 0948 06/22/19 0948 09/25/19 1331 10/05/19 1243 12/06/19 0000  WBC 8.9   < > 7.4   < > 7.6 6.0 5.9  NEUTROABS 6.9  --  5.4  --  6.0  --   --   HGB 11.5*   < > 12.1   < > 12.4 11.3* 11.6*  HCT 34.5*   < > 37.2   < > 37.8 34.3* 36  MCV 115.4*   < > 118.5*  --  116.7* 119.5*  --   PLT 450*   < > 435*   < > 372 298 343   < > = values in this interval not displayed.   Lipid Panel: No results for input(s): CHOL, HDL, LDLCALC, TRIG, CHOLHDL, LDLDIRECT in the last 8760 hours. No results found for: HGBA1C  Procedures since last visit: CT Abdomen Pelvis Wo Contrast  Result Date: 12/17/2019 CLINICAL DATA:  Diffuse abdominal pain. EXAM: CT ABDOMEN AND PELVIS WITHOUT CONTRAST TECHNIQUE: Multidetector CT imaging of the abdomen and pelvis was performed following the standard protocol without IV contrast. COMPARISON:  04/09/2019 from [facility] FINDINGS: Lower Chest: Stable cardiomegaly. New multifocal areas of patchy airspace disease and subpleural reticulonodular opacities are seen in right lower lung, most consistent with atypical infectious or inflammatory etiology. Hepatobiliary: No hepatic masses identified. Tiny calcified gallstones are again seen, without evidence of cholecystitis or biliary ductal dilatation. Pancreas:  No mass or inflammatory changes. Spleen: Within normal limits in size and appearance. Adrenals/Urinary Tract: Several bilateral renal cysts are again seen which remain stable. Severe right renal pelvicaliectasis remains stable, and there is no evidence of right ureteral dilatation or calculi. Unremarkable unopacified urinary bladder. Stomach/Bowel: No evidence of obstruction, inflammatory process or abnormal fluid collections. Vascular/Lymphatic: No pathologically enlarged lymph nodes. No abdominal aortic aneurysm. Aortic atherosclerotic calcification noted. Reproductive: Prior hysterectomy noted. Adnexal  regions are unremarkable in appearance. Other: A small right lower quadrant ventral abdominal wall hernia is again seen containing several small bowel loops. No evidence of strangulation or obstruction. Musculoskeletal: No suspicious bone lesions identified. Old compression fractures of the L1 and L2 vertebral bodies are unchanged. Internal fixation hardware again seen in the right hip. IMPRESSION: No acute findings within the abdomen or pelvis. Stable severe right renal pelvicaliectasis, without ureteral dilatation or calculi. 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 Cholelithiasis. No radiographic evidence of cholecystitis. Stable small right lower quadrant ventral hernia. New multifocal areas of patchy airspace disease and subpleural reticulonodular opacities in right lower lung, most consistent with atypical infectious or inflammatory etiology. Recommend further evaluation with chest radiograph and/or follow-up by CT. Electronically Signed   By: Marlaine Hind M.D.   On: 12/17/2019 10:16   DG Chest 2 View  Result Date: 12/19/2019 CLINICAL DATA:  Cough.  Abnormal CT abdomen/pelvis. EXAM: CHEST - 2 VIEW COMPARISON:  CT abdomen and pelvis December 14, 2019. Chest radiograph July 22, 2017. FINDINGS: Compared to chest radiograph July 22, 2017, there are new nodular opacities in the right mid and lower lung. Linear left basilar scar/atelectasis. No visible pleural effusions or pneumothorax. Right ventral abdominal wall hernia, better characterized on recent CT abdomen and pelvis. Similar enlarged cardiac silhouette. Similar compression fractures of the lumbar spine. Similar S-shaped thoracolumbar curvature. IMPRESSION: In  comparison to prior chest radiograph from July 22, 2017, new nodular opacities in the right mid and lower lung. When correlating with recent CT abdomen and pelvis, these most likely represent an atypical infectious or inflammatory process (including mycobacterium avium complex  infection). However, recommend short interval follow-up chest radiograph after treatment to ensure resolution. If not resolved at that time, chest CT is recommended to further evaluate. Electronically Signed   By: Margaretha Sheffield MD   On: 12/19/2019 15:47    Assessment/Plan 1. Community acquired pneumonia, unspecified laterality - appears to be clearing -completed abx with cefdinir and zpak - clinically improved here so f/u labs for wbc and ensure renal function ok with plans for aggressive outpatient diuresis per cardio over the weekend - Basic metabolic panel - Brain Natriuretic Peptide - CBC with Differential/Platelet  2. CHF NYHA class III (symptoms with mildly strenuous activities), acute on chronic, diastolic (HCC) - for torsemide 27m po bid until visit with Dr. MAundra Dubinand has labs in f/u afterward with him - Brain Natriuretic Peptide today  3. Other fatigue - due to pneumonia and now chf, as well, but is improved from a few days back - Basic metabolic panel - Brain Natriuretic Peptide - CBC with Differential/Platelet  Labs/tests ordered:   Orders Placed This Encounter  Procedures  . Basic metabolic panel    Order Specific Question:   Has the patient fasted?    Answer:   No    Order Specific Question:   Release to patient    Answer:   Immediate  . Brain Natriuretic Peptide  . CBC with Differential/Platelet    Next appt:  03/25/2020  Estiven Kohan L. Aithan Farrelly, D.O. [facility] [address] [place] [id number] Phone (Mon-Fri 8am-5pm):  [phone] Call:  [phone]& follow prompts after 5pm & weekends Office Phone:  [phone] Fax:  [phone]

## 2019-12-28 ENCOUNTER — Other Ambulatory Visit (HOSPITAL_COMMUNITY): Payer: Self-pay | Admitting: *Deleted

## 2019-12-28 LAB — BASIC METABOLIC PANEL
BUN/Creatinine Ratio: 40 (calc) — ABNORMAL HIGH (ref 6–22)
BUN: 52 mg/dL — ABNORMAL HIGH (ref 7–25)
CO2: 31 mmol/L (ref 20–32)
Calcium: 10.9 mg/dL — ABNORMAL HIGH (ref 8.6–10.4)
Chloride: 98 mmol/L (ref 98–110)
Creat: 1.3 mg/dL — ABNORMAL HIGH (ref 0.60–0.88)
Glucose, Bld: 84 mg/dL (ref 65–139)
Potassium: 4.4 mmol/L (ref 3.5–5.3)
Sodium: 139 mmol/L (ref 135–146)

## 2019-12-28 LAB — CBC WITH DIFFERENTIAL/PLATELET
Absolute Monocytes: 689 cells/uL (ref 200–950)
Basophils Absolute: 39 cells/uL (ref 0–200)
Basophils Relative: 0.6 %
Eosinophils Absolute: 39 cells/uL (ref 15–500)
Eosinophils Relative: 0.6 %
HCT: 31.5 % — ABNORMAL LOW (ref 35.0–45.0)
Hemoglobin: 10.8 g/dL — ABNORMAL LOW (ref 11.7–15.5)
Lymphs Abs: 1040 cells/uL (ref 850–3900)
MCH: 38.8 pg — ABNORMAL HIGH (ref 27.0–33.0)
MCHC: 34.3 g/dL (ref 32.0–36.0)
MCV: 113.3 fL — ABNORMAL HIGH (ref 80.0–100.0)
MPV: 10.5 fL (ref 7.5–12.5)
Monocytes Relative: 10.6 %
Neutro Abs: 4693 cells/uL (ref 1500–7800)
Neutrophils Relative %: 72.2 %
Platelets: 395 10*3/uL (ref 140–400)
RBC: 2.78 10*6/uL — ABNORMAL LOW (ref 3.80–5.10)
RDW: 13.7 % (ref 11.0–15.0)
Total Lymphocyte: 16 %
WBC: 6.5 10*3/uL (ref 3.8–10.8)

## 2019-12-28 LAB — BRAIN NATRIURETIC PEPTIDE: Brain Natriuretic Peptide: 542 pg/mL — ABNORMAL HIGH (ref <100)

## 2019-12-28 MED ORDER — TORSEMIDE 20 MG PO TABS
ORAL_TABLET | ORAL | 3 refills | Status: DC
Start: 2019-12-28 — End: 2020-01-01

## 2019-12-28 NOTE — Progress Notes (Signed)
Kidney function is a bit worse due antibiotics and diuretics.  There are plans for it to be rechecked after the weekend of diuresis due to her significant edema. BNP was elevated vs her baseline consistent with CHF though not terrible.  Anemia appears worse but some may be dilutional with her volume overload.  No elevation of white blood cells which is good suggesting resolution of the pneumonia (along with her slightly improved energy and appearance yesterday).

## 2019-12-31 ENCOUNTER — Encounter (INDEPENDENT_AMBULATORY_CARE_PROVIDER_SITE_OTHER): Payer: Medicare Other | Admitting: Ophthalmology

## 2020-01-01 ENCOUNTER — Other Ambulatory Visit: Payer: Self-pay

## 2020-01-01 ENCOUNTER — Ambulatory Visit (HOSPITAL_COMMUNITY)
Admission: RE | Admit: 2020-01-01 | Discharge: 2020-01-01 | Disposition: A | Discharge status: Home / Self Care | Payer: Medicare Other | Source: Ambulatory Visit | Attending: Cardiology | Admitting: Cardiology

## 2020-01-01 ENCOUNTER — Ambulatory Visit (HOSPITAL_BASED_OUTPATIENT_CLINIC_OR_DEPARTMENT_OTHER)
Admission: RE | Admit: 2020-01-01 | Discharge: 2020-01-01 | Disposition: A | Discharge status: Home / Self Care | Payer: Medicare Other | Source: Ambulatory Visit | Attending: Cardiology | Admitting: Cardiology

## 2020-01-01 ENCOUNTER — Encounter (HOSPITAL_COMMUNITY): Payer: Self-pay | Admitting: Cardiology

## 2020-01-01 VITALS — BP 120/80 | HR 82 | Wt 108.4 lb

## 2020-01-01 DIAGNOSIS — Z87891 Personal history of nicotine dependence: Secondary | ICD-10-CM | POA: Diagnosis not present

## 2020-01-01 DIAGNOSIS — I482 Chronic atrial fibrillation, unspecified: Secondary | ICD-10-CM | POA: Diagnosis not present

## 2020-01-01 DIAGNOSIS — I495 Sick sinus syndrome: Secondary | ICD-10-CM | POA: Insufficient documentation

## 2020-01-01 DIAGNOSIS — Z8249 Family history of ischemic heart disease and other diseases of the circulatory system: Secondary | ICD-10-CM | POA: Insufficient documentation

## 2020-01-01 DIAGNOSIS — I081 Rheumatic disorders of both mitral and tricuspid valves: Secondary | ICD-10-CM | POA: Insufficient documentation

## 2020-01-01 DIAGNOSIS — Z880 Allergy status to penicillin: Secondary | ICD-10-CM | POA: Diagnosis not present

## 2020-01-01 DIAGNOSIS — I110 Hypertensive heart disease with heart failure: Secondary | ICD-10-CM | POA: Diagnosis not present

## 2020-01-01 DIAGNOSIS — I5032 Chronic diastolic (congestive) heart failure: Secondary | ICD-10-CM | POA: Diagnosis not present

## 2020-01-01 DIAGNOSIS — Z7901 Long term (current) use of anticoagulants: Secondary | ICD-10-CM | POA: Diagnosis not present

## 2020-01-01 DIAGNOSIS — Z881 Allergy status to other antibiotic agents status: Secondary | ICD-10-CM | POA: Diagnosis not present

## 2020-01-01 DIAGNOSIS — Z79890 Hormone replacement therapy (postmenopausal): Secondary | ICD-10-CM | POA: Insufficient documentation

## 2020-01-01 DIAGNOSIS — Z888 Allergy status to other drugs, medicaments and biological substances status: Secondary | ICD-10-CM | POA: Insufficient documentation

## 2020-01-01 DIAGNOSIS — E785 Hyperlipidemia, unspecified: Secondary | ICD-10-CM | POA: Diagnosis not present

## 2020-01-01 DIAGNOSIS — Z79899 Other long term (current) drug therapy: Secondary | ICD-10-CM | POA: Diagnosis not present

## 2020-01-01 LAB — BASIC METABOLIC PANEL
Anion gap: 9 (ref 5–15)
BUN: 42 mg/dL — ABNORMAL HIGH (ref 8–23)
CO2: 31 mmol/L (ref 22–32)
Calcium: 10.3 mg/dL (ref 8.9–10.3)
Chloride: 101 mmol/L (ref 98–111)
Creatinine, Ser: 1.24 mg/dL — ABNORMAL HIGH (ref 0.44–1.00)
GFR, Estimated: 42 mL/min — ABNORMAL LOW (ref >60)
Glucose, Bld: 98 mg/dL (ref 70–99)
Potassium: 3.5 mmol/L (ref 3.5–5.1)
Sodium: 141 mmol/L (ref 135–145)

## 2020-01-01 LAB — ECHOCARDIOGRAM COMPLETE
Area-P 1/2: 5.39 cm2
Calc EF: 67.7 %
MV M vel: 5.11 m/s
MV Peak grad: 104.4 mmHg
Radius: 0.6 cm
S' Lateral: 2.2 cm
Single Plane A2C EF: 69.5 %
Single Plane A4C EF: 63.3 %

## 2020-01-01 MED ORDER — POTASSIUM CHLORIDE CRYS ER 20 MEQ PO TBCR: 20.0000 meq | EXTENDED_RELEASE_TABLET | ORAL | 3 refills | Status: AC

## 2020-01-01 MED ORDER — TORSEMIDE 20 MG PO TABS
80.0000 mg | ORAL_TABLET | Freq: Two times a day (BID) | ORAL | 3 refills | Status: DC
Start: 2020-01-01 — End: 2020-01-17

## 2020-01-01 MED ORDER — METOLAZONE 2.5 MG PO TABS: 2.5000 mg | ORAL_TABLET | ORAL | 3 refills | Status: DC

## 2020-01-01 NOTE — Progress Notes (Signed)
  Echocardiogram 2D Echocardiogram has been performed.  Sarah Mays 01/01/2020, 2:00 PM

## 2020-01-01 NOTE — Patient Instructions (Signed)
Continue Torsemide 80 mg (4 tabs) Twice daily   Take Metolazone 2.5 mg tomorrow morning and again on Saturday morning, then starting next week take Metolazone 2.5 mg EVERY Wednesday  Take Potassiu 20 meq (1 tab) tomorrow, Saturday, and then every Wednesday when you take Metolazone  Labs done today, your results will be available in MyChart, we will contact you for abnormal readings.  Your physician recommends that you schedule a follow-up appointment in: 2-3 weeks with lab work  If you have any questions or concerns before your next appointment please send [place] a message through [place] or call our office at [phone].    TO LEAVE A MESSAGE FOR THE NURSE SELECT OPTION 2, PLEASE LEAVE A MESSAGE INCLUDING: . YOUR NAME . DATE OF BIRTH . CALL BACK NUMBER . REASON FOR CALL**this is important as we prioritize the call backs  [place] AS LONG AS YOU CALL BEFORE 4:00 PM  At the [facility], you and your health needs are our priority. As part of our continuing mission to provide you with exceptional heart care, we have created designated Provider Care Teams. These Care Teams include your primary Cardiologist (physician) and Advanced Practice Providers (APPs- Physician Assistants and Nurse Practitioners) who all work together to provide you with the care you need, when you need it.   You may see any of the following providers on your designated Care Team at your next follow up: [name] Kitchen Dr Glori Bickers . Dr Loralie Champagne . Darrick Grinder, NP . Lyda Jester, PA . Audry Riles, PharmD   Please be sure to bring in all your medications bottles to every appointment.

## 2020-01-02 ENCOUNTER — Telehealth (HOSPITAL_COMMUNITY): Payer: Self-pay | Admitting: Licensed Clinical Social Worker

## 2020-01-02 NOTE — Progress Notes (Signed)
ID:  Sarah Mays, DOB [birth year], MRN [id number]   Provider location: [facility] Type of Visit: Established patient  PCP:  Gayland Curry, DO  Cardiologist:  Loralie Champagne, MD   History of Present Illness: Sarah Mays is a 86 y.o. female who has a history of paroxysmal atrial fibrillation and chronic diastolic CHF.  In 7/16, she was admitted with chest pain.  Cardiolite showed no ischemia or infarction.  Echo in 6/17 showed EF 60-65%.  She developed problems elevated HR when in atrial fibrillation but bradycardia when in NSR.  She underwent atrial fibrillation ablation in 11/17.  She is tolerating Xarelto without melena or BRBPR.    Echo in 11/18 showed EF 60-65%, PASP 47 mmHg.   TEE-guided DCCV back to NSR in 6/19.  TEE showed EF 55-60% with moderate-severe MR.   She developed multiple episodes of epistaxis with Xarelto use.  We have transitioned her to apixaban 2.5 mg bid and she has not had epistaxis since then.   Echo in 6/20 showed EF 60-65%, normal RV, mild-moderate MR.   She had recurrent atrial fibrillation with DCCV to NSR in 6/20.  She then had atypical flutter with DCCV to NSR in 8/20. Symptomatically worse when in atrial fibrillation/flutter. At a prior visit, she was in junctional rhythm. Zio patch x 3 days in 9/20 showed 51% atrial flutter with controlled rate. She saw Dr. Rayann Heman and they decided against repeat ablation. She has been in atrial fibrillation and flutter chronically since then, amiodarone was stopped since it did not keep her NSR.  Echo was done today and reviewed, EF 60-65%, mildly decreased RV systolic function with D-shaped interventricular septum, PASP 68, moderate MR, severe TR.   She returns for followup of CHF and atrial fibrillation. Weight is up 2 lbs.  Her feet are swollen.  She has been more short of breath recently, was dyspneic walking into the office.  She is on antibiotics for possible PNA, she was COVID-19  negative.  No fever.  No orthopnea/PND.  No lightheadedness.     Labs (7/16): K 4, creatinine 0.94, calcium 10.8, LFTs normal Labs (9/16): K 4, creatinine 0.9, LDL 69, TSH normal, HCT 42.3 Labs (5/17): K 4.6, creatinine 0.94, HCT 41.5, LFTs normal. Labs (6/17): K 5, creatinine 1.04, LDL 42, HDL 101, TSH normal Labs (12/17): K 3.7, creatinine 0.76, hgb 11.2 Labs (4/18): K 4.6, creatinine 0.91 Labs (10/18): K 4.5, creatinine 0.62 Labs (12/18): hgb 13.5 Labs (1/19): K 4.3, creatinine 0.6 Labs (4/19): hgb 13.5 Labs (7/19): K 4.5, creatinine 0.5, hgb 12.1 Labs (11/19): K 3.7, creatinine 0.95 Labs (12/19): K 3.6, creatinine 0.9, hgb 11.6 Labs (6/20): K 3.7, creatinine 1.01 => 1.17 Labs (9/20): K 4.1, creatinine 1.17 Labs (3/21): K 3.7, creatinine 1.27 Labs (5/21): K 3.8, creatinine 1.4, hgb 10.8, bnp 549 Labs (7/21): K 3.9, creatinine 1.16 Labs (11/21): K 4.4, creatinine 1.3, BNP 542  PMH: 1. Atrial fibrillation/flutter: Diagnosed initially in 10/13.  Holter monitor in 11/13 showed atrial fibrillation with average rate 65.  - Atrial fibrillation ablation in 11/17.  - TEE-guided DCCV in 6/19.  - atrial fibrillation with DCCV to NSR in 6/20.  - atypical atrial flutter with DCCV to NSR in 8/20.  - Zio patch x 3 days (9/20): 51% atrial flutter - Now chronic 2. Chronic diastolic CHF: Echo (98/92) with EF 65-70%, mild MR, moderate biatrial enlargement, moderate TR, PA systolic pressure 45 mmHg.   Echo (10/15): EF  60-65%.  Echo (7/16) with EF 60-65%, mild MR.  - Echo (6/17): EF 60-65%, PASP 41 mmHg.  - Echo (11/18): EF 60-65%, PASP 47 mmHg - TEE (6/19): EF 55-60%, mild LVH, mildly decreased RV systolic function, peak RV-RA gradient 40 mmHg, moderate-severe MR with bileaflet MVP, ERO 0.37 cm^2.  - Echo (6/20): EF 60-65%, normal RV, mild-moderate MR, severe biatrial enlargement.  - Echo (11/21): EF 60-65%, mildly decreased RV systolic function with D-shaped interventricular septum, PASP 68,  moderate MR, severe TR. 3. ETT-Sestamibi (11/13): Exercised to stage II, small partially reversible anteroapical perfusion defect suggesting mild ischemia versus attenuation, EF 80%.  Cardiolite (7/16) with EF 64%, no ischemia or infarction.  4. HTN: Lower extremity swelling with amlodipine.  5. Polycythemia vera: Has had phlebotomy only once.  6. Hypothyroidism 7. Macular degeneration 8. TAH 1980 9. Familial hypocalciuric hypercalcemia 10. PFTs (amiodarone use) in 1/14 showed a mild obstructive defect.  11. Degenerative disc disease 12. Sick sinus syndrome: Has not required PPM. Junctional rhythm has been noted in the past.  13. Mitral regurgitation: TEE (6/19) with moderate-severe MR with bileaflet MVP, ERO 0.37 cm^2.  - Echo (6/20) with mild-moderate MR.   Current Outpatient Medications  Medication Sig Dispense Refill  . acetaminophen (TYLENOL) 325 MG tablet Take 650 mg by mouth every 4 (four) hours as needed.    [name] Kitchen ELIQUIS 2.5 MG TABS tablet TAKE 1 TABLET BY MOUTH TWICE DAILY. 180 tablet 3  . fentaNYL (DURAGESIC) 12 MCG/HR Place one patch onto the skin every 3 days. Remove old patch 10 patch 0  . gabapentin (NEURONTIN) 100 MG capsule Take 1 capsule (100 mg total) by mouth daily. 90 capsule 3  . hydrALAZINE (APRESOLINE) 50 MG tablet Take 1.5 tablets (75 mg total) by mouth 3 (three) times daily. TAKE 1 1/2 TABLETS THREE TIMES DAILY. 405 tablet 3  . hydroxyurea (HYDREA) 500 MG capsule 2 capsules daily from Mondays to Fridays and 1 capsule daily on Saturdays and Sunday 100 capsule 3  . levothyroxine (SYNTHROID) 100 MCG tablet TAKE 1 TABLET ONCE DAILY BEFORE BREAKFAST. 90 tablet 0  . Magnesium 250 MG TABS Take 250 mg by mouth daily.     . Melatonin 10 MG TABS Take 10 mg by mouth at bedtime.    . metoprolol succinate (TOPROL-XL) 25 MG 24 hr tablet Take 1 tablet (25 mg total) by mouth 2 (two) times daily. 60 tablet 11  . Multiple Vitamins-Minerals (PRESERVISION AREDS 2) CAPS Take 1 capsule by  mouth 2 (two) times daily.     . sennosides-docusate sodium (SENOKOT-S) 8.6-50 MG tablet Take 2 tablets by mouth 2 (two) times daily.    . sodium chloride (OCEAN) 0.65 % SOLN nasal spray Place 2 sprays into both nostrils 2 (two) times daily.     [name] Kitchen torsemide (DEMADEX) 20 MG tablet Take 4 tablets (80 mg total) by mouth 2 (two) times daily. 240 tablet 3  . traMADol (ULTRAM) 50 MG tablet TAKE ONE TABLET AT BEDTIME. 30 tablet 5  . metolazone (ZAROXOLYN) 2.5 MG tablet Take 1 tablet (2.5 mg total) by mouth once a week. Every Wednesday 10 tablet 3  . potassium chloride SA (KLOR-CON) 20 MEQ tablet Take 1 tablet (20 mEq total) by mouth once a week. Every Wednesday when you take Metolazone 10 tablet 3   No current facility-administered medications for this encounter.    Allergies:   Akten [lidocaine], Amlodipine, and Augmentin [amoxicillin-pot clavulanate]   Social History:  The patient  reports that she quit  smoking about 35 years ago. Her smoking use included cigarettes. She has never used smokeless tobacco. She reports current alcohol use of about 1.0 standard drink of alcohol per week. She reports that she does not use drugs.   Family History:  The patient's family history includes Breast cancer in her daughter; Cancer in her daughter, sister, sister, and sister; Heart disease in her father and sister; Hypertension in her father; Ovarian cancer (age of onset: 52) in her mother.   ROS:  Please see the history of present illness.   All other systems are personally reviewed and negative.   Exam:   BP 120/80   Pulse 82   Wt 49.2 kg (108 lb 6.4 oz)   SpO2 92%   BMI 20.48 kg/m  General: NAD Neck: JVP 14 cm with prominent CV waves, no thyromegaly or thyroid nodule.  Lungs: Occasional rhonchi bilaterally CV: Nondisplaced PMI.  Heart irregular S1/S2, no S3/S4, 2/6 HSM LLSB.  2+ ankle edema.  No carotid bruit.  Normal pedal pulses.  Abdomen: Soft, nontender, no hepatosplenomegaly, no distention.  Skin:  Intact without lesions or rashes.  Neurologic: Alert and oriented x 3.  Psych: Normal affect. Extremities: No clubbing or cyanosis.  HEENT: Normal.   Recent Labs: 06/19/2019: ALT 19 12/27/2019: Brain Natriuretic Peptide 542; Hemoglobin 10.8; Platelets 395 01/01/2020: BUN 42; Creatinine, Ser 1.24; Potassium 3.5; Sodium 141  Personally reviewed   Wt Readings from Last 3 Encounters:  01/01/20 49.2 kg (108 lb 6.4 oz)  12/27/19 50.1 kg (110 lb 6.4 oz)  12/05/19 49 kg (108 lb)      ASSESSMENT AND PLAN:  1. Atrial fibrillation/flutter: First noted in 10/13.  Atrial fibrillation has triggered acute on chronic diastolic CHF in the past.  CHADSVASC score is 38 (age, female gender, HTN, CHF).  She is anticoagulated with apixaban 2.5 mg bid which is appropriate for her age/weight.  She had atrial fibrillation ablation in 11/17.  TEE-guided DCCV in 6/19.  Cardioversion in 6/20 for atrial fibrillation and again in 8/20 for atypical atrial flutter.  Zio patch in 9/20 showed 51% atrial flutter. She saw Dr. Rayann Heman who thought rate-control would probably be the best strategy, recommended against repeat ablation.  She is now off amiodarone and in chronic atrial fibrillation.  - Adequate rate control with Toprol XL 25 mg bid.  - Continue apixaban 2.5 mg bid.   2. Chronic diastolic CHF:  Echo in 19/50 shows EF 60-65%, mildly decreased RV systolic function with D-shaped interventricular septum, PASP 68, moderate MR, severe TR.   Symptomatically worse since she has been in permanent atrial fibrillation.  Has prominent RV dysfunction with severe TR.  NYHA class III symptoms, she is volume overloaded on exam.  - Torsemide recently increased to 80 mg bid.  Will keep at this dose. - She will take metolazone with am torsemide on Wednesday, again on Saturday, then every Wednesday after that (once weekly long-term).  Take KCl 20 when metolazone taken.  BMET today and in 10 days.   - Wear compression stockings during the  day.   3. HTN: BP controlled.    4. Bradycardia: Suspect she has a degree of sick sinus syndrome. Prior junctional rhythm.  No recent bradyarrhythmias.  - Follow rhythm closely on Toprol XL.   5. Mitral regurgitation: Moderate on echo today, possibly due to dilated MV annulus with chronic atrial fibrillation and LA dilation.  6. Tricuspid regurgitation: Severe.   Followup in 2-3 weeks with APP to reassess volume.  Signed, Loralie Champagne, MD  01/02/2020  [place] [address] [facility] [place] [address] [phone] (office) [phone] (fax)

## 2020-01-02 NOTE — Telephone Encounter (Signed)
Paramedicine Initial Assessment:  Housing:  In what kind of housing do you live? House/apt/trailer/shelter? apartment  Do you rent/pay a mortgage/own? rent  Do you live with anyone? Lives alone in apartment- husband at [facility] but in a higher level of care  Are you currently worried about losing your housing? no  Within the past 12 months have you ever stayed outside, in a car, tent, a shelter, or temporarily with someone? no  Within the past 12 months have you been unable to get utilities when it was really needed? no  Social:  What is your current marital status? married  Do you have any children? Daughter- Thad Ranger you have family or friends who live locally? No one but her daughter.  Food:  Within the past 12 months were you ever worried that food would run out before you got money to buy more? no  Within the past 28months have you run out of food and didn't have money to buy more? no  Income:  What is your current source of income? Retirement income  How hard is it for you to pay for the basics like food housing, medical care, and utilities? Not hard at all  Do you have outstanding medical bills? Not that she is aware of- her son helps her with finances  Insurance:  Are you currently insured? Yes Medicare and BCBS supplement  Do you have prescription coverage? yes  If no insurance, have you applied for coverage (Medicaid, disability, marketplace etc)? n/a  Transportation:  Do you have transportation to your medical appointments? Yes, gets transportation through [employer]  In the past 12 months has lack of transportation kept you from medical appts or from getting medications? no  In the past 12 months has lack of transportation kept you from meetings, work, or getting things you needed? no   Daily Health Needs: Do you have a working scale at home? Yes- weighs daily  How do you manage your medications at home? Takes out all her medications daily and puts them  in piles  Do you ever take your medications differently than prescribed? no  Do you have issues affording your medications? no  If yes, has this ever prevented you from obtaining medications? no  Do you have any concerns with mobility at home? No- can complete ADLs independently  Do you use any assistive devices at home or have PCS at home? walker  Do you have a PCP? Tiffany Reed   Are there any additional barriers you see to getting the care you need? no  CSW will continue to follow through paramedicine program and assist as needed.  Jorge Ny, LCSW Clinical Social Worker [facility] Desk#: [phone] Cell#: [phone]

## 2020-01-02 NOTE — Telephone Encounter (Signed)
CSW consulted to enroll patient in [employer].  Unable to reach- left VM requesting return call  Sarah Mays, [facility] Desk#: [phone] Cell#: [phone]

## 2020-01-07 ENCOUNTER — Inpatient Hospital Stay: Payer: Medicare Other | Admitting: Hematology and Oncology

## 2020-01-07 ENCOUNTER — Other Ambulatory Visit: Payer: Self-pay | Admitting: Internal Medicine

## 2020-01-07 ENCOUNTER — Inpatient Hospital Stay: Payer: Medicare Other

## 2020-01-07 NOTE — Telephone Encounter (Signed)
Last TSH greater than 1 year ago, will send to Gayland Curry, DO for review   Patient doesn't have a pending appointment with Gayland Curry, DO   Last routine visit 08/15/2019

## 2020-01-07 NOTE — Telephone Encounter (Signed)
She may have had a TSH that was not abstracted from [facility].  If not, we should do one at [facility] at her earliest convenience on a tues or thurs

## 2020-01-08 ENCOUNTER — Other Ambulatory Visit: Payer: Self-pay

## 2020-01-08 ENCOUNTER — Ambulatory Visit (INDEPENDENT_AMBULATORY_CARE_PROVIDER_SITE_OTHER): Payer: Medicare Other | Admitting: Ophthalmology

## 2020-01-08 ENCOUNTER — Encounter (INDEPENDENT_AMBULATORY_CARE_PROVIDER_SITE_OTHER): Payer: Self-pay | Admitting: Ophthalmology

## 2020-01-08 DIAGNOSIS — H353211 Exudative age-related macular degeneration, right eye, with active choroidal neovascularization: Secondary | ICD-10-CM | POA: Diagnosis not present

## 2020-01-08 DIAGNOSIS — H353221 Exudative age-related macular degeneration, left eye, with active choroidal neovascularization: Secondary | ICD-10-CM | POA: Diagnosis not present

## 2020-01-08 MED ORDER — AFLIBERCEPT 2MG/0.05ML IZ SOLN FOR KALEIDOSCOPE
2.0000 mg | INTRAVITREAL | Status: AC | PRN
  Administered 2020-01-08: 2 mg via INTRAVITREAL

## 2020-01-08 NOTE — Assessment & Plan Note (Signed)
Improved OD at 8-week follow-up, much less intraretinal fluid and CME as compared to August 2021.  Repeat injection today and examination OD in 10 weeks

## 2020-01-08 NOTE — Telephone Encounter (Signed)
I checked [facility], no TSH report available.  RX approved for 30 day supply  Order faxed to [facility] at [phone], Attention: Clinic Nurse requesting that they correlate lab draw with patient on their next schedule lab day.

## 2020-01-08 NOTE — Progress Notes (Signed)
01/08/2020     CHIEF COMPLAINT Patient presents for Retina Follow Up   HISTORY OF PRESENT ILLNESS: Sarah Mays is a 86 y.o. female who presents to the clinic today for:   HPI    Retina Follow Up    Patient presents with  Wet AMD.  In right eye.  This started 9 weeks ago.  Duration of 9 weeks.          Comments    9 WK F/U OD, POSS EYLEA OD   Pt reports stable vision, no new F/F, no pain or pressure.        Last edited by Nichola Sizer D on 01/08/2020  3:06 PM. (History)      Referring physician: Gayland Curry, DO [place],  [place] [address]  HISTORICAL INFORMATION:   Selected notes from the [facility]: No current outpatient medications on file. (Ophthalmic Drugs)   No current facility-administered medications for this visit. (Ophthalmic Drugs)   Current Outpatient Medications (Other)  Medication Sig  . acetaminophen (TYLENOL) 325 MG tablet Take 650 mg by mouth every 4 (four) hours as needed.  [name] Kitchen ELIQUIS 2.5 MG TABS tablet TAKE 1 TABLET BY MOUTH TWICE DAILY.  . fentaNYL (DURAGESIC) 12 MCG/HR Place one patch onto the skin every 3 days. Remove old patch  . gabapentin (NEURONTIN) 100 MG capsule Take 1 capsule (100 mg total) by mouth daily.  . hydrALAZINE (APRESOLINE) 50 MG tablet Take 1.5 tablets (75 mg total) by mouth 3 (three) times daily. TAKE 1 1/2 TABLETS THREE TIMES DAILY.  . hydroxyurea (HYDREA) 500 MG capsule 2 capsules daily from Mondays to Fridays and 1 capsule daily on Saturdays and Sunday  . levothyroxine (SYNTHROID) 100 MCG tablet Take 1 tablet daily before breakfast LABS OVERDUE  . Magnesium 250 MG TABS Take 250 mg by mouth daily.   . Melatonin 10 MG TABS Take 10 mg by mouth at bedtime.  . metolazone (ZAROXOLYN) 2.5 MG tablet Take 1 tablet (2.5 mg total) by mouth once a week. Every Wednesday  . metoprolol succinate (TOPROL-XL) 25 MG 24 hr tablet Take 1 tablet (25 mg total) by mouth 2 (two)  times daily.  . Multiple Vitamins-Minerals (PRESERVISION AREDS 2) CAPS Take 1 capsule by mouth 2 (two) times daily.   . potassium chloride SA (KLOR-CON) 20 MEQ tablet Take 1 tablet (20 mEq total) by mouth once a week. Every Wednesday when you take Metolazone  . sennosides-docusate sodium (SENOKOT-S) 8.6-50 MG tablet Take 2 tablets by mouth 2 (two) times daily.  . sodium chloride (OCEAN) 0.65 % SOLN nasal spray Place 2 sprays into both nostrils 2 (two) times daily.   [name] Kitchen torsemide (DEMADEX) 20 MG tablet Take 4 tablets (80 mg total) by mouth 2 (two) times daily.  . traMADol (ULTRAM) 50 MG tablet TAKE ONE TABLET AT BEDTIME.   No current facility-administered medications for this visit. (Other)      REVIEW OF SYSTEMS:    ALLERGIES Allergies  Allergen Reactions  . Akten [Lidocaine]   . Amlodipine Swelling and Other (See Comments)    Lower extremity swelling   . Augmentin [Amoxicillin-Pot Clavulanate] Other (See Comments)    Stomach Pain/cramps Did it involve swelling of the face/tongue/throat, SOB, or low BP? No Did it involve sudden or severe rash/hives, skin peeling, or any reaction on the inside of your mouth or nose? No Did you need to seek medical attention at a  hospital or doctor's office? Unknown When did it last happen?unknown If all above answers are "NO", may proceed with cephalosporin use.     PAST MEDICAL HISTORY Past Medical History:  Diagnosis Date  . Abnormal stress test    a. 11/2011 Ex MV: EF 80%, small, partially reversible anteroapical defect->mild ischemia vs attenuation-->med Rx.  [name] Kitchen Alopecia, unspecified   . Arthritis   . Chronic diastolic CHF (congestive heart failure) ([place])    a. 11/2013 Echo: EF 60-65%, no rwma, mild TR, PASP 19mmHg.  . Closed fracture of lower end of radius with ulna   . Deafness    Left, s/p multiple surgeries  . Essential hypertension   . Gouty arthropathy, unspecified   . History of kidney stones   . Hypothyroidism   .  Insomnia, unspecified   . Macular degeneration    Left, s/p inj. tx  . Mitral valve disorders(424.0)   . Neck mass    right, w/u including MRI negative  . PAF (paroxysmal atrial fibrillation) (HCC)    a. on amio (02/2012 mild obstruction on PFT's) and xarelto.  . Pneumonia   . Polycythemia vera(238.4)   . Pure hypercholesterolemia   . Senile osteoporosis    Reclast in the past  . Tricuspid valve disorders, specified as nonrheumatic    Past Surgical History:  Procedure Laterality Date  . BREAST BIOPSY  06/18/1998   left  . CARDIOVERSION N/A 07/15/2017   Procedure: CARDIOVERSION;  Surgeon: Larey Dresser, MD;  Location: [facility] ENDOSCOPY;  Service: Cardiovascular;  Laterality: N/A;  . CARDIOVERSION N/A 07/21/2018   Procedure: CARDIOVERSION;  Surgeon: Larey Dresser, MD;  Location: [facility] ENDOSCOPY;  Service: Cardiovascular;  Laterality: N/A;  . CARDIOVERSION N/A 10/06/2018   Procedure: CARDIOVERSION;  Surgeon: Larey Dresser, MD;  Location: [facility] ENDOSCOPY;  Service: Cardiovascular;  Laterality: N/A;  . ELECTROPHYSIOLOGIC STUDY N/A 01/08/2016   Procedure: Atrial Fibrillation Ablation;  Surgeon: Thompson Grayer, MD;  Location: [facility] CV LAB;  Service: Cardiovascular;  Laterality: N/A;  . FEMUR IM NAIL Right 10/08/2015   Procedure: INTRAMEDULLARY (IM) NAIL FEMORAL RIGHT;  Surgeon: Paralee Cancel, MD;  Location: [facility] ORS;  Service: Orthopedics;  Laterality: Right;  . MASS EXCISION Right 11/26/2016   Procedure: EXCISION RIGHT LATERAL NECK MASS;  Surgeon: Jerrell Belfast, MD;  Location: [facility];  Service: ENT;  Laterality: Right;  . TEE WITHOUT CARDIOVERSION N/A 07/15/2017   Procedure: TRANSESOPHAGEAL ECHOCARDIOGRAM (TEE);  Surgeon: Larey Dresser, MD;  Location: [facility] ENDOSCOPY;  Service: Cardiovascular;  Laterality: N/A;  . TONSILLECTOMY    . TOTAL ABDOMINAL HYSTERECTOMY    . TYMPANOPLASTY Bilateral     FAMILY HISTORY Family History  Problem Relation Age of Onset  . Hypertension Father   . Heart  disease Father        patient does not know details.   . Ovarian cancer Mother 70  . Cancer Sister        colon  . Heart disease Sister   . Cancer Sister   . Cancer Sister        hodgkin disease  . Breast cancer Daughter   . Cancer Daughter        breast    SOCIAL HISTORY Social History   Tobacco Use  . Smoking status: Former Smoker    Types: Cigarettes    Quit date: 01/19/1984    Years since quitting: 35.9  . Smokeless tobacco: Never Used  Vaping Use  . Vaping Use: Never used  Substance Use Topics  .  Alcohol use: Yes    Alcohol/week: 1.0 standard drink    Types: 1 Glasses of wine per week    Comment: Every evening   . Drug use: No         OPHTHALMIC EXAM: Base Eye Exam    Visual Acuity (ETDRS)      Right Left   Dist cc 20/50 -2 20/200   Dist ph cc 20/40 -2 NI   Correction: Glasses       Tonometry (Tonopen, 3:16 PM)      Right Left   Pressure 19 14       Pupils      Dark Light Shape React APD   Right 4 3 Round Slow None   Left 4 3 Irregular Slow None       Visual Fields (Counting fingers)      Left Right    Full    Restrictions  Partial outer inferior temporal, inferior nasal deficiencies       Extraocular Movement      Right Left    Full Full       Neuro/Psych    Oriented x3: Yes   Mood/Affect: Normal       Dilation    Right eye: 1.0% Mydriacyl, 2.5% Phenylephrine @ 3:16 PM        Slit Lamp and Fundus Exam    External Exam      Right Left   External Normal        Slit Lamp Exam      Right Left   Lids/Lashes Normal    Conjunctiva/Sclera White and quiet    Cornea Clear    Anterior Chamber Deep and quiet    Iris Round and reactive    Lens Posterior chamber intraocular lens     Anterior Vitreous Normal        Fundus Exam      Right Left   Posterior Vitreous Posterior vitreous detachment    Disc Normal    C/D Ratio 0.45    Macula Atrophy, Age related macular degeneration, Advanced age related macular degeneration, [place],  Geographic atrophy, no macular thickening, no hemorrhage    Vessels Normal    Periphery Normal           IMAGING AND PROCEDURES  Imaging and Procedures for 01/08/20  OCT, Retina - OU - Both Eyes       Right Eye Quality was good. Scan locations included subfoveal. Central Foveal Thickness: 230. Findings include central retinal atrophy, outer retinal atrophy, subretinal scarring.   Left Eye Quality was good. Scan locations included subfoveal. Central Foveal Thickness: 350. Findings include subretinal scarring, central retinal atrophy, outer retinal atrophy.   Notes Much less intraretinal fluid nasal to the fovea as compared to August 2021 on [facility], will repeat today at 8-week interval And examination again in 10 weeks OD       Intravitreal Injection, Pharmacologic Agent - OD - Right Eye       Time Out 01/08/2020. 4:21 PM. Confirmed correct patient, procedure, site, and patient consented.   Anesthesia Topical anesthesia was used. Anesthetic medications included Lidocaine 4%.   Procedure Preparation included 10% betadine to eyelids, 5% betadine to ocular surface, Tobramycin 0.3%. A 30 gauge needle was used.   Injection:  2 mg aflibercept Alfonse Flavors) SOLN   NDC: [id number], Lot: [id number]   Route: Intravitreal, Site: Right Eye, Waste: 0 mg  Post-op Post injection exam found visual acuity of at least counting fingers. The patient tolerated  the procedure well. There were no complications. The patient received written and verbal post procedure care education.   Notes Lidocaine topically applied inferotemporal with sterile Q-tip.                ASSESSMENT/PLAN:  Exudative age-related macular degeneration of right eye with active choroidal neovascularization ([place]) Improved OD at 8-week follow-up, much less intraretinal fluid and CME as compared to August 2021.  Repeat injection today and examination OD in 10 weeks  Exudative age-related macular degeneration of left  eye with active choroidal neovascularization ([place]) Left eye now 11-week status post Avastin and stable with significant RPE and retinal atrophy centrally.  No perifoveal activity.  Scheduled for follow-up visit next week.  We will delay this for 4 to 5 weeks      ICD-10-CM   1. Exudative age-related macular degeneration of right eye with active choroidal neovascularization (HCC)  H35.3211 OCT, Retina - OU - Both Eyes    Intravitreal Injection, Pharmacologic Agent - OD - Right Eye    aflibercept (EYLEA) SOLN 2 mg  2. Exudative age-related macular degeneration of left eye with active choroidal neovascularization ([place])  H35.3221     1.  Repeat injection intravitreal Eylea OD today, currently at 8-week follow-up, will extend interval examination, and follow-up OD in 10 weeks  2.  OS planned and scheduled for follow-up next week at 3 months, will delay this for 4 to 5 weeks with examination OS and possible injection at that time  3.  Ophthalmic Meds Ordered this visit:  Meds ordered this encounter  Medications  . aflibercept (EYLEA) SOLN 2 mg       Return in about 5 weeks (around 02/12/2020) for dilate, OS, OCT, AVASTIN OCT.  There are no Patient Instructions on file for this visit.   Explained the diagnoses, plan, and follow up with the patient and they expressed understanding.  Patient expressed understanding of the importance of proper follow up care.   Clent Demark Annaelle Kasel M.D. Diseases & Surgery of the Retina and Vitreous Retina [facility] Diabetic [place] 01/08/20     Abbreviations: M myopia (nearsighted); A astigmatism; H hyperopia (farsighted); P presbyopia; Mrx spectacle prescription;  CTL contact lenses; OD right eye; OS left eye; OU both eyes  XT exotropia; ET esotropia; PEK punctate epithelial keratitis; PEE punctate epithelial erosions; DES dry eye syndrome; MGD meibomian gland dysfunction; ATs artificial tears; PFAT's preservative free artificial tears; [place] nuclear sclerotic  cataract; PSC posterior subcapsular cataract; ERM epi-retinal membrane; PVD posterior vitreous detachment; RD retinal detachment; DM diabetes mellitus; DR diabetic retinopathy; NPDR non-proliferative diabetic retinopathy; PDR proliferative diabetic retinopathy; CSME clinically significant macular edema; DME diabetic macular edema; dbh dot blot hemorrhages; CWS cotton wool spot; POAG primary open angle glaucoma; C/D cup-to-disc ratio; HVF humphrey visual field; GVF goldmann visual field; OCT optical coherence tomography; IOP intraocular pressure; BRVO Branch retinal vein occlusion; CRVO central retinal vein occlusion; CRAO central retinal artery occlusion; BRAO branch retinal artery occlusion; RT retinal tear; SB scleral buckle; PPV pars plana vitrectomy; VH Vitreous hemorrhage; PRP panretinal laser photocoagulation; IVK intravitreal kenalog; VMT vitreomacular traction; MH Macular hole;  NVD neovascularization of the disc; NVE neovascularization elsewhere; AREDS age related eye disease study; ARMD age related macular degeneration; POAG primary open angle glaucoma; EBMD epithelial/anterior basement membrane dystrophy; ACIOL anterior chamber intraocular lens; IOL intraocular lens; PCIOL posterior chamber intraocular lens; Phaco/IOL phacoemulsification with intraocular lens placement; [place] photorefractive keratectomy; LASIK laser assisted in situ keratomileusis; HTN hypertension; DM diabetes mellitus; COPD chronic  obstructive pulmonary disease

## 2020-01-08 NOTE — Assessment & Plan Note (Signed)
Left eye now 11-week status post Avastin and stable with significant RPE and retinal atrophy centrally.  No perifoveal activity.  Scheduled for follow-up visit next week.  We will delay this for 4 to 5 weeks

## 2020-01-10 ENCOUNTER — Telehealth: Payer: Self-pay | Admitting: Hematology and Oncology

## 2020-01-10 ENCOUNTER — Telehealth: Payer: Self-pay | Admitting: Hematology

## 2020-01-10 ENCOUNTER — Inpatient Hospital Stay (HOSPITAL_BASED_OUTPATIENT_CLINIC_OR_DEPARTMENT_OTHER): Payer: Medicare Other | Admitting: Hematology and Oncology

## 2020-01-10 ENCOUNTER — Other Ambulatory Visit: Payer: Self-pay

## 2020-01-10 ENCOUNTER — Inpatient Hospital Stay: Payer: Medicare Other | Attending: Hematology and Oncology

## 2020-01-10 ENCOUNTER — Encounter: Payer: Self-pay | Admitting: Hematology and Oncology

## 2020-01-10 DIAGNOSIS — D45 Polycythemia vera: Secondary | ICD-10-CM

## 2020-01-10 DIAGNOSIS — D630 Anemia in neoplastic disease: Secondary | ICD-10-CM

## 2020-01-10 DIAGNOSIS — I952 Hypotension due to drugs: Secondary | ICD-10-CM | POA: Insufficient documentation

## 2020-01-10 DIAGNOSIS — D649 Anemia, unspecified: Secondary | ICD-10-CM | POA: Diagnosis not present

## 2020-01-10 LAB — CBC WITH DIFFERENTIAL/PLATELET
Abs Immature Granulocytes: 0.02 10*3/uL (ref 0.00–0.07)
Basophils Absolute: 0 10*3/uL (ref 0.0–0.1)
Basophils Relative: 1 %
Eosinophils Absolute: 0.1 10*3/uL (ref 0.0–0.5)
Eosinophils Relative: 1 %
HCT: 34.6 % — ABNORMAL LOW (ref 36.0–46.0)
Hemoglobin: 11.4 g/dL — ABNORMAL LOW (ref 12.0–15.0)
Immature Granulocytes: 0 %
Lymphocytes Relative: 19 %
Lymphs Abs: 1.1 10*3/uL (ref 0.7–4.0)
MCH: 38.4 pg — ABNORMAL HIGH (ref 26.0–34.0)
MCHC: 32.9 g/dL (ref 30.0–36.0)
MCV: 116.5 fL — ABNORMAL HIGH (ref 80.0–100.0)
Monocytes Absolute: 0.6 10*3/uL (ref 0.1–1.0)
Monocytes Relative: 11 %
Neutro Abs: 4.2 10*3/uL (ref 1.7–7.7)
Neutrophils Relative %: 68 %
Platelets: 393 10*3/uL (ref 150–400)
RBC: 2.97 MIL/uL — ABNORMAL LOW (ref 3.87–5.11)
RDW: 15.5 % (ref 11.5–15.5)
WBC: 6.1 10*3/uL (ref 4.0–10.5)
nRBC: 0 % (ref 0.0–0.2)

## 2020-01-10 NOTE — Assessment & Plan Note (Signed)
The cause of her anemia is multifactorial, likely secondary to mild renal failure and side effects of hydroxyurea She is not symptomatic Observe for now 

## 2020-01-10 NOTE — Assessment & Plan Note (Signed)
With recent dose adjustment, her platelet count is normal She is mildly anemic but likely related to other health issues such as mild chronic renal failure For now, I do not plan to change the dose of her hydroxyurea She will continue taking 2 capsules daily from Mondays to Fridays and 1 capsule daily on Saturdays and Sunday I plan to see her again in 3 months for further follow-up

## 2020-01-10 NOTE — Progress Notes (Signed)
[place] [facility] OFFICE PROGRESS NOTE  Patient Care Team: Gayland Curry, DO as PCP - General (Geriatric Medicine) Larey Dresser, MD as PCP - Cardiology (Cardiology) [facility], [facility], Clent Demark, MD as Consulting Physician (Ophthalmology) Larey Dresser, MD as Consulting Physician (Cardiology) Heath Lark, MD as Consulting Physician (Hematology and Oncology) Clent Jacks, MD as Consulting Physician (Ophthalmology) Jerrell Belfast, MD as Consulting Physician (Otolaryngology)  ASSESSMENT & PLAN:  Polycythemia vera With recent dose adjustment, her platelet count is normal She is mildly anemic but likely related to other health issues such as mild chronic renal failure For now, I do not plan to change the dose of her hydroxyurea She will continue taking 2 capsules daily from Mondays to Fridays and 1 capsule daily on Saturdays and Sunday I plan to see her again in 3 months for further follow-up  Hypotension due to drugs Her blood pressure is very low She is not dizzy or symptomatic I recommend close follow-up with primary care doctor and cardiologist She has appointment to see cardiologist next week for further checkup I will also alert her daughter about her low blood pressure so that she can keep a very close eye on the patient  Anemia in neoplastic disease The cause of her anemia is multifactorial, likely secondary to mild renal failure and side effects of hydroxyurea She is not symptomatic Observe for now   No orders of the defined types were placed in this encounter.   All questions were answered. The patient knows to call the clinic with any problems, questions or concerns. The total time spent in the appointment was 20 minutes encounter with patients including review of chart and various tests results, discussions about plan of care and coordination of care plan   Heath Lark, MD 01/10/2020 12:58 PM  INTERVAL HISTORY: Please see below  for problem oriented charting. She returns for further follow-up while on hydroxyurea She missed her appointment recently The patient denies any recent signs or symptoms of bleeding such as spontaneous epistaxis, hematuria or hematochezia. Her blood pressure is noted to be low and she is on 4 different antihypertensives According to the patient, the dose of her diuretic was recently increased because of leg swelling No recent infection, fever or chills She is up-to-date with her vaccinations   SUMMARY OF ONCOLOGIC HISTORY: Oncology History  Polycythemia vera ([place])  08/05/2011 Pathology Results   Peripheral blood JAK2 mutation was positive with low serum erythropoietin level. Bone marrow aspirate and biopsy was not performed.   08/12/2011 -  Chemotherapy   She is started on hydroxyurea with periodic phlebotomy.     REVIEW OF SYSTEMS:   Constitutional: Denies fevers, chills or abnormal weight loss Eyes: Denies blurriness of vision Ears, nose, mouth, throat, and face: Denies mucositis or sore throat Respiratory: Denies cough, dyspnea or wheezes Cardiovascular: Denies palpitation, chest discomfort  Gastrointestinal:  Denies nausea, heartburn or change in bowel habits Skin: Denies abnormal skin rashes Lymphatics: Denies new lymphadenopathy or easy bruising Neurological:Denies numbness, tingling or new weaknesses Behavioral/Psych: Mood is stable, no new changes  All other systems were reviewed with the patient and are negative.  I have reviewed the past medical history, past surgical history, social history and family history with the patient and they are unchanged from previous note.  ALLERGIES:  is allergic to akten [lidocaine], amlodipine, and augmentin [amoxicillin-pot clavulanate].  MEDICATIONS:  Current Outpatient Medications  Medication Sig Dispense Refill  . acetaminophen (TYLENOL) 325 MG tablet Take 650  mg by mouth every 4 (four) hours as needed.    [name] Kitchen ELIQUIS 2.5 MG TABS  tablet TAKE 1 TABLET BY MOUTH TWICE DAILY. 180 tablet 3  . fentaNYL (DURAGESIC) 12 MCG/HR Place one patch onto the skin every 3 days. Remove old patch 10 patch 0  . gabapentin (NEURONTIN) 100 MG capsule Take 1 capsule (100 mg total) by mouth daily. 90 capsule 3  . hydrALAZINE (APRESOLINE) 50 MG tablet Take 1.5 tablets (75 mg total) by mouth 3 (three) times daily. TAKE 1 1/2 TABLETS THREE TIMES DAILY. 405 tablet 3  . hydroxyurea (HYDREA) 500 MG capsule 2 capsules daily from Mondays to Fridays and 1 capsule daily on Saturdays and Sunday 100 capsule 3  . levothyroxine (SYNTHROID) 100 MCG tablet Take 1 tablet daily before breakfast LABS OVERDUE 30 tablet 0  . Magnesium 250 MG TABS Take 250 mg by mouth daily.     . Melatonin 10 MG TABS Take 10 mg by mouth at bedtime.    . metolazone (ZAROXOLYN) 2.5 MG tablet Take 1 tablet (2.5 mg total) by mouth once a week. Every Wednesday 10 tablet 3  . metoprolol succinate (TOPROL-XL) 25 MG 24 hr tablet Take 1 tablet (25 mg total) by mouth 2 (two) times daily. 60 tablet 11  . Multiple Vitamins-Minerals (PRESERVISION AREDS 2) CAPS Take 1 capsule by mouth 2 (two) times daily.     . potassium chloride SA (KLOR-CON) 20 MEQ tablet Take 1 tablet (20 mEq total) by mouth once a week. Every Wednesday when you take Metolazone 10 tablet 3  . sennosides-docusate sodium (SENOKOT-S) 8.6-50 MG tablet Take 2 tablets by mouth 2 (two) times daily.    . sodium chloride (OCEAN) 0.65 % SOLN nasal spray Place 2 sprays into both nostrils 2 (two) times daily.     [name] Kitchen torsemide (DEMADEX) 20 MG tablet Take 4 tablets (80 mg total) by mouth 2 (two) times daily. 240 tablet 3  . traMADol (ULTRAM) 50 MG tablet TAKE ONE TABLET AT BEDTIME. 30 tablet 5   No current facility-administered medications for this visit.    PHYSICAL EXAMINATION: ECOG PERFORMANCE STATUS: 2 - Symptomatic, <50% confined to bed  Vitals:   01/10/20 1212  BP: (!) 93/57  Pulse: 66  Resp: 18  Temp: 98.2 F (36.8 C)  SpO2:  96%   Filed Weights   01/10/20 1212  Weight: 104 lb 12.8 oz (47.5 kg)    GENERAL:alert, no distress and comfortable NEURO: alert & oriented x 3 with fluent speech, no focal motor/sensory deficits  LABORATORY DATA:  I have reviewed the data as listed    Component Value Date/Time   NA 141 01/01/2020 1456   NA 137 12/06/2019 0000   NA 136 09/14/2016 1428   K 3.5 01/01/2020 1456   K 4.0 09/14/2016 1428   CL 101 01/01/2020 1456   CO2 31 01/01/2020 1456   CO2 30 (H) 09/14/2016 1428   GLUCOSE 98 01/01/2020 1456   GLUCOSE 112 09/14/2016 1428   BUN 42 (H) 01/01/2020 1456   BUN 38 (A) 12/06/2019 0000   BUN 14.7 09/14/2016 1428   CREATININE 1.24 (H) 01/01/2020 1456   CREATININE 1.30 (H) 12/27/2019 1137   CREATININE 0.8 09/14/2016 1428   CALCIUM 10.3 01/01/2020 1456   CALCIUM 11.0 (H) 09/14/2016 1428   PROT 6.6 06/19/2019 1435   PROT 7.0 09/14/2016 1428   ALBUMIN 4.2 06/19/2019 1435   ALBUMIN 4.6 09/14/2016 1428   AST 17 06/19/2019 1435   AST 17 09/14/2016  1428   ALT 19 06/19/2019 1435   ALT 18 09/14/2016 1428   ALKPHOS 48 06/19/2019 1435   ALKPHOS 62 09/14/2016 1428   BILITOT 1.1 06/19/2019 1435   BILITOT 1.00 09/14/2016 1428   GFRNONAA 42 (L) 01/01/2020 1456   GFRAA 46 (L) 10/05/2019 1243    No results found for: SPEP, UPEP  Lab Results  Component Value Date   WBC 6.1 01/10/2020   NEUTROABS 4.2 01/10/2020   HGB 11.4 (L) 01/10/2020   HCT 34.6 (L) 01/10/2020   MCV 116.5 (H) 01/10/2020   PLT 393 01/10/2020      Chemistry      Component Value Date/Time   NA 141 01/01/2020 1456   NA 137 12/06/2019 0000   NA 136 09/14/2016 1428   K 3.5 01/01/2020 1456   K 4.0 09/14/2016 1428   CL 101 01/01/2020 1456   CO2 31 01/01/2020 1456   CO2 30 (H) 09/14/2016 1428   BUN 42 (H) 01/01/2020 1456   BUN 38 (A) 12/06/2019 0000   BUN 14.7 09/14/2016 1428   CREATININE 1.24 (H) 01/01/2020 1456   CREATININE 1.30 (H) 12/27/2019 1137   CREATININE 0.8 09/14/2016 1428   GLU 106  12/06/2019 0000      Component Value Date/Time   CALCIUM 10.3 01/01/2020 1456   CALCIUM 11.0 (H) 09/14/2016 1428   ALKPHOS 48 06/19/2019 1435   ALKPHOS 62 09/14/2016 1428   AST 17 06/19/2019 1435   AST 17 09/14/2016 1428   ALT 19 06/19/2019 1435   ALT 18 09/14/2016 1428   BILITOT 1.1 06/19/2019 1435   BILITOT 1.00 09/14/2016 1428       RADIOGRAPHIC STUDIES: I have personally reviewed the radiological images as listed and agreed with the findings in the report. CT Abdomen Pelvis Wo Contrast  Result Date: 12/17/2019 CLINICAL DATA:  Diffuse abdominal pain. EXAM: CT ABDOMEN AND PELVIS WITHOUT CONTRAST TECHNIQUE: Multidetector CT imaging of the abdomen and pelvis was performed following the standard protocol without IV contrast. COMPARISON:  04/09/2019 from [facility] FINDINGS: Lower Chest: Stable cardiomegaly. New multifocal areas of patchy airspace disease and subpleural reticulonodular opacities are seen in right lower lung, most consistent with atypical infectious or inflammatory etiology. Hepatobiliary: No hepatic masses identified. Tiny calcified gallstones are again seen, without evidence of cholecystitis or biliary ductal dilatation. Pancreas:  No mass or inflammatory changes. Spleen: Within normal limits in size and appearance. Adrenals/Urinary Tract: Several bilateral renal cysts are again seen which remain stable. Severe right renal pelvicaliectasis remains stable, and there is no evidence of right ureteral dilatation or calculi. Unremarkable unopacified urinary bladder. Stomach/Bowel: No evidence of obstruction, inflammatory process or abnormal fluid collections. Vascular/Lymphatic: No pathologically enlarged lymph nodes. No abdominal aortic aneurysm. Aortic atherosclerotic calcification noted. Reproductive: Prior hysterectomy noted. Adnexal regions are unremarkable in appearance. Other: A small right lower quadrant ventral abdominal wall hernia is again seen containing several  small bowel loops. No evidence of strangulation or obstruction. Musculoskeletal: No suspicious bone lesions identified. Old compression fractures of the L1 and L2 vertebral bodies are unchanged. Internal fixation hardware again seen in the right hip. IMPRESSION: No acute findings within the abdomen or pelvis. Stable severe right renal pelvicaliectasis, without ureteral dilatation or calculi. 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 Cholelithiasis. No radiographic evidence of cholecystitis. Stable small right lower quadrant ventral hernia. New multifocal areas of patchy airspace disease and subpleural reticulonodular opacities in right lower lung, most consistent with atypical infectious or inflammatory etiology.  Recommend further evaluation with chest radiograph and/or follow-up by CT. Electronically Signed   By: Marlaine Hind M.D.   On: 12/17/2019 10:16   DG Chest 2 View  Result Date: 12/19/2019 CLINICAL DATA:  Cough.  Abnormal CT abdomen/pelvis. EXAM: CHEST - 2 VIEW COMPARISON:  CT abdomen and pelvis December 14, 2019. Chest radiograph July 22, 2017. FINDINGS: Compared to chest radiograph July 22, 2017, there are new nodular opacities in the right mid and lower lung. Linear left basilar scar/atelectasis. No visible pleural effusions or pneumothorax. Right ventral abdominal wall hernia, better characterized on recent CT abdomen and pelvis. Similar enlarged cardiac silhouette. Similar compression fractures of the lumbar spine. Similar S-shaped thoracolumbar curvature. IMPRESSION: In comparison to prior chest radiograph from July 22, 2017, new nodular opacities in the right mid and lower lung. When correlating with recent CT abdomen and pelvis, these most likely represent an atypical infectious or inflammatory process (including mycobacterium avium complex infection). However, recommend short interval follow-up chest radiograph after treatment to ensure resolution. If not resolved at that time, chest  CT is recommended to further evaluate. Electronically Signed   By: Margaretha Sheffield MD   On: 12/19/2019 15:47   ECHOCARDIOGRAM COMPLETE  Result Date: 01/01/2020    ECHOCARDIOGRAM REPORT   Patient Name:   PANTERA WINTERROWD Date of Exam: 01/01/2020 Medical Rec #:  [id number]         Height:       61.0 in Accession #:    [id number]        Weight:       110.4 lb Date of Birth:  18-Mar-1930         BSA:          1.467 m Patient Age:    85 years          BP:           129/81 mmHg Patient Gender: F                 HR:           69 bpm. Exam Location:  Outpatient Procedure: 2D Echo, 3D Echo, Cardiac Doppler, Color Doppler and Strain Analysis Indications:    I50.40* Unspecified combined systolic (congestive) and diastolic                 (congestive) heart failure  History:        Patient has prior history of Echocardiogram examinations, most                 recent 07/12/2018. CHF, CAD, Abnormal ECG, Arrythmias:Atrial                 Fibrillation, Signs/Symptoms:Chest Pain; Risk                 Factors:Dyslipidemia and Hypertension. Sick sinus syndrome.  Sonographer:    Roseanna Rainbow Referring Phys: [place]  Sonographer Comments: Global longitudinal strain was attempted. Patient was extremely sensitive to pressure from probe. IMPRESSIONS  1. Left ventricular ejection fraction, by estimation, is 60 to 65%. The left ventricle has normal function. The left ventricle has no regional wall motion abnormalities. There is mild left ventricular hypertrophy. Left ventricular diastolic parameters are indeterminate.  2. Right ventricular systolic function is mildly reduced. The right ventricular size is mildly enlarged. There is severely elevated pulmonary artery systolic pressure. D-shaped interventricular septum suggestive of RV pressure/volume overload. PA systolic pressure 68 mmHg.  3. Left atrial size was severely  dilated.  4. Right atrial size was severely dilated.  5. Mild bileaflet mitral valve prolapse. Moderate  mitral valve regurgitation. No evidence of mitral stenosis.  6. The aortic valve is tricuspid. Aortic valve regurgitation is not visualized. No aortic stenosis is present.  7. Aortic dilatation noted. There is mild dilatation of the ascending aorta, measuring 41 mm.  8. Tricuspid valve regurgitation is severe.  9. The inferior vena cava is dilated in size with >50% respiratory variability, suggesting right atrial pressure of 8 mmHg. FINDINGS  Left Ventricle: Left ventricular ejection fraction, by estimation, is 60 to 65%. The left ventricle has normal function. The left ventricle has no regional wall motion abnormalities. The left ventricular internal cavity size was normal in size. There is  mild left ventricular hypertrophy. Left ventricular diastolic parameters are indeterminate. Right Ventricle: The right ventricular size is mildly enlarged. No increase in right ventricular wall thickness. Right ventricular systolic function is mildly reduced. There is severely elevated pulmonary artery systolic pressure. The tricuspid regurgitant velocity is 3.85 m/s, and with an assumed right atrial pressure of 8 mmHg, the estimated right ventricular systolic pressure is 71.2 mmHg. Left Atrium: Left atrial size was severely dilated. Right Atrium: Right atrial size was severely dilated. Pericardium: Trivial pericardial effusion is present. Mitral Valve: The mitral valve is abnormal. Moderate mitral valve regurgitation. No evidence of mitral valve stenosis. MV peak gradient, 10.1 mmHg. The mean mitral valve gradient is 3.0 mmHg. Tricuspid Valve: The tricuspid valve is normal in structure. Tricuspid valve regurgitation is severe. No evidence of tricuspid stenosis. Aortic Valve: The aortic valve is tricuspid. Aortic valve regurgitation is not visualized. No aortic stenosis is present. Pulmonic Valve: The pulmonic valve was normal in structure. Pulmonic valve regurgitation is trivial. Aorta: The aortic root is normal in size and  structure and aortic dilatation noted. There is mild dilatation of the ascending aorta, measuring 41 mm. Venous: The inferior vena cava is dilated in size with greater than 50% respiratory variability, suggesting right atrial pressure of 8 mmHg. IAS/Shunts: No atrial level shunt detected by color flow Doppler.  LEFT VENTRICLE PLAX 2D LVIDd:         3.70 cm LVIDs:         2.20 cm LV PW:         1.20 cm LV IVS:        1.20 cm LVOT diam:     1.90 cm LV SV:         39 LV SV Index:   27 LVOT Area:     2.84 cm  LV Volumes (MOD) LV vol d, MOD A2C: 66.8 ml LV vol d, MOD A4C: 46.9 ml LV vol s, MOD A2C: 20.4 ml LV vol s, MOD A4C: 17.2 ml LV SV MOD A2C:     46.4 ml LV SV MOD A4C:     46.9 ml LV SV MOD BP:      39.6 ml RIGHT VENTRICLE            IVC RV S prime:     8.38 cm/s  IVC diam: 2.80 cm TAPSE (M-mode): 1.6 cm LEFT ATRIUM             Index       RIGHT ATRIUM           Index LA diam:        4.70 cm 3.20 cm/m  RA Area:     24.50 cm LA Vol (A2C):   75.2 ml 51.25  ml/m RA Volume:   66.60 ml  45.39 ml/m LA Vol (A4C):   53.0 ml 36.12 ml/m LA Biplane Vol: 65.2 ml 44.44 ml/m  AORTIC VALVE LVOT Vmax:   83.00 cm/s LVOT Vmean:  53.500 cm/s LVOT VTI:    0.139 m  AORTA Ao Root diam: 3.50 cm Ao Asc diam:  4.10 cm MITRAL VALVE                 TRICUSPID VALVE MV Area (PHT): 5.39 cm      TR Peak grad:   59.3 mmHg MV Peak grad:  10.1 mmHg     TR Vmax:        385.00 cm/s MV Mean grad:  3.0 mmHg MV Vmax:       1.59 m/s      SHUNTS MV Vmean:      69.1 cm/s     Systemic VTI:  0.14 m MV Decel Time: 141 msec      Systemic Diam: 1.90 cm MR Peak grad:    104.4 mmHg MR Mean grad:    71.0 mmHg MR Vmax:         511.00 cm/s MR Vmean:        405.0 cm/s MR PISA:         2.26 cm MR PISA Eff ROA: 18 mm MR PISA Radius:  0.60 cm MV E velocity: 141.67 cm/s Loralie Champagne MD Electronically signed by Loralie Champagne MD Signature Date/Time: 01/01/2020/2:36:21 PM    Final    Intravitreal Injection, Pharmacologic Agent - OD - Right Eye  Result Date:  01/08/2020 Time Out 01/08/2020. 4:21 PM. Confirmed correct patient, procedure, site, and patient consented. Anesthesia Topical anesthesia was used. Anesthetic medications included Lidocaine 4%. Procedure Preparation included 10% betadine to eyelids, 5% betadine to ocular surface, Tobramycin 0.3%. A 30 gauge needle was used. Injection: 2 mg aflibercept Alfonse Flavors) SOLN   NDC: [id number], Lot: [id number]   Route: Intravitreal, Site: Right Eye, Waste: 0 mg Post-op Post injection exam found visual acuity of at least counting fingers. The patient tolerated the procedure well. There were no complications. The patient received written and verbal post procedure care education. Notes Lidocaine topically applied inferotemporal with sterile Q-tip.  OCT, Retina - OU - Both Eyes  Result Date: 01/08/2020 Right Eye Quality was good. Scan locations included subfoveal. Central Foveal Thickness: 230. Findings include central retinal atrophy, outer retinal atrophy, subretinal scarring. Left Eye Quality was good. Scan locations included subfoveal. Central Foveal Thickness: 350. Findings include subretinal scarring, central retinal atrophy, outer retinal atrophy. Notes Much less intraretinal fluid nasal to the fovea as compared to August 2021 on [facility], will repeat today at 8-week interval And examination again in 10 weeks OD

## 2020-01-10 NOTE — Assessment & Plan Note (Signed)
Her blood pressure is very low She is not dizzy or symptomatic I recommend close follow-up with primary care doctor and cardiologist She has appointment to see cardiologist next week for further checkup I will also alert her daughter about her low blood pressure so that she can keep a very close eye on the patient

## 2020-01-10 NOTE — Telephone Encounter (Signed)
Scheduled appts per 12/2 sch msg. Gave pt a printout of AVS.

## 2020-01-11 ENCOUNTER — Telehealth: Payer: Self-pay | Admitting: *Deleted

## 2020-01-11 NOTE — Telephone Encounter (Signed)
Stop hydralazine.  Would stop metolazone as well.  Needs BMET on Monday and BP check with nurse in office on Monday.

## 2020-01-11 NOTE — Telephone Encounter (Signed)
I've asked for her to hold hydralazine and torsemide tonight due to BP 98/62 up from 76/58 earlier.  Take half of her toprol xl tonight only to maintain her rate with her afib.  Asked for vitals to be rechecked in the am before anymore meds are given.  Nurse was waiting to hear back from cardiology.

## 2020-01-11 NOTE — Telephone Encounter (Signed)
Nonda Lou, Clinic Nurse called and stated that she just wanted to let you know that patient's blood pressure has been running low and having Dizziness since Dr. Aundra Dubin started patient on a new Peripheral Edema Medication.   Nurse checked patient BP today and it was 76/58. She had patient to hold her BP medication that she takes three times a daily until they hear back from Dr. Aundra Dubin and to increase her fluids.  Nurse stated that she has notified Dr. Claris Gladden office and awaiting a call back. She just wanted to make you aware.

## 2020-01-14 ENCOUNTER — Encounter (INDEPENDENT_AMBULATORY_CARE_PROVIDER_SITE_OTHER): Payer: Medicare Other | Admitting: Ophthalmology

## 2020-01-14 ENCOUNTER — Telehealth: Payer: Self-pay

## 2020-01-14 ENCOUNTER — Encounter: Payer: Self-pay | Admitting: Internal Medicine

## 2020-01-14 NOTE — Telephone Encounter (Signed)
Autumn left VM to schedule pt. I called her back at the number she left on the VM [phone] no answer and the voicemail box is full.

## 2020-01-14 NOTE — Progress Notes (Signed)
[name] [place] Phone: [phone] Subjective:   Rito Ehrlich, am serving as a scribe for Dr. Hulan Saas. This visit occurred during the SARS-CoV-2 public health emergency.  Safety protocols were in place, including screening questions prior to the visit, additional usage of staff PPE, and extensive cleaning of exam room while observing appropriate contact time as indicated for disinfecting solutions.   I'm seeing this patient by the request  of:  Mariea Clonts, Tiffany L, DO  CC: Knee pain follow-up  [id number]   10/18/2019 Discussed with patient no significant reaccumulation of the Baker's cyst at the moment.  The only thing that would be curative with likely removal.  Patient does not want any surgical intervention.  Otherwise the only other thing we can do is every 10 to 12 weeks consider injections if needed.  Patient feels if she had that done she should do relatively well.  Update 01/14/2020 ALDEAN SUDDETH is a 86 y.o. female coming in with complaint of R knee states the knee was better after with last injection in September. Patient states when sitting no issues, but when walking or sitting to standing or standing to sitting is when she has the pain.  Patient states no increase in instability.  Left knee is starting to give her some mild discomfort as well but still seems to be the right is the most worse.      Past Medical History:  Diagnosis Date  . Abnormal stress test    a. 11/2011 Ex MV: EF 80%, small, partially reversible anteroapical defect->mild ischemia vs attenuation-->med Rx.  [name] Kitchen Alopecia, unspecified   . Arthritis   . Chronic diastolic CHF (congestive heart failure) ([place])    a. 11/2013 Echo: EF 60-65%, no rwma, mild TR, PASP 68mmHg.  . Closed fracture of lower end of radius with ulna   . Deafness    Left, s/p multiple surgeries  . Essential hypertension   . Gouty arthropathy, unspecified   .  History of kidney stones   . Hypothyroidism   . Insomnia, unspecified   . Macular degeneration    Left, s/p inj. tx  . Mitral valve disorders(424.0)   . Neck mass    right, w/u including MRI negative  . PAF (paroxysmal atrial fibrillation) (HCC)    a. on amio (02/2012 mild obstruction on PFT's) and xarelto.  . Pneumonia   . Polycythemia vera(238.4)   . Pure hypercholesterolemia   . Senile osteoporosis    Reclast in the past  . Tricuspid valve disorders, specified as nonrheumatic    Past Surgical History:  Procedure Laterality Date  . BREAST BIOPSY  06/18/1998   left  . CARDIOVERSION N/A 07/15/2017   Procedure: CARDIOVERSION;  Surgeon: Larey Dresser, MD;  Location: [facility] ENDOSCOPY;  Service: Cardiovascular;  Laterality: N/A;  . CARDIOVERSION N/A 07/21/2018   Procedure: CARDIOVERSION;  Surgeon: Larey Dresser, MD;  Location: [facility] ENDOSCOPY;  Service: Cardiovascular;  Laterality: N/A;  . CARDIOVERSION N/A 10/06/2018   Procedure: CARDIOVERSION;  Surgeon: Larey Dresser, MD;  Location: [facility] ENDOSCOPY;  Service: Cardiovascular;  Laterality: N/A;  . ELECTROPHYSIOLOGIC STUDY N/A 01/08/2016   Procedure: Atrial Fibrillation Ablation;  Surgeon: Thompson Grayer, MD;  Location: [place] CV LAB;  Service: Cardiovascular;  Laterality: N/A;  . FEMUR IM NAIL Right 10/08/2015   Procedure: INTRAMEDULLARY (IM) NAIL FEMORAL RIGHT;  Surgeon: Paralee Cancel, MD;  Location: [facility] ORS;  Service: Orthopedics;  Laterality: Right;  . MASS  EXCISION Right 11/26/2016   Procedure: EXCISION RIGHT LATERAL NECK MASS;  Surgeon: Jerrell Belfast, MD;  Location: [place];  Service: ENT;  Laterality: Right;  . TEE WITHOUT CARDIOVERSION N/A 07/15/2017   Procedure: TRANSESOPHAGEAL ECHOCARDIOGRAM (TEE);  Surgeon: Larey Dresser, MD;  Location: [facility] ENDOSCOPY;  Service: Cardiovascular;  Laterality: N/A;  . TONSILLECTOMY    . TOTAL ABDOMINAL HYSTERECTOMY    . TYMPANOPLASTY Bilateral    Social History   Socioeconomic History  . Marital  status: Married    Spouse name: [name]  . Number of children: 2  . Years of education: 39  . Highest education level: Not on file  Occupational History  . Not on file  Tobacco Use  . Smoking status: Former Smoker    Types: Cigarettes    Quit date: 01/19/1984    Years since quitting: 36.0  . Smokeless tobacco: Never Used  Vaping Use  . Vaping Use: Never used  Substance and Sexual Activity  . Alcohol use: Yes    Alcohol/week: 1.0 standard drink    Types: 1 Glasses of wine per week    Comment: Every evening   . Drug use: No  . Sexual activity: Not Currently  Other Topics Concern  . Not on file  Social History Narrative   Patient is Married 1955. 2 kids. 70 grandkid-104 years old in 2016. College graduate [name] [place]).    Lives in apartment,  Independent Living  section at [facility] since 02/2013.      Stay at home mother.       No Smoking history, Mod. alcohol use.   Patient has a living will, POA      Hobbies: CPU and painting         Social Determinants of Health   Financial Resource Strain: Low Risk   . Difficulty of Paying Living Expenses: Not hard at all  Food Insecurity: No Food Insecurity  . Worried About Charity fundraiser in the Last Year: Never true  . Ran Out of Food in the Last Year: Never true  Transportation Needs: No Transportation Needs  . Lack of Transportation (Medical): No  . Lack of Transportation (Non-Medical): No  Physical Activity:   . Days of Exercise per Week: Not on file  . Minutes of Exercise per Session: Not on file  Stress:   . Feeling of Stress : Not on file  Social Connections:   . Frequency of Communication with Friends and Family: Not on file  . Frequency of Social Gatherings with Friends and Family: Not on file  . Attends Religious Services: Not on file  . Active Member of Clubs or Organizations: Not on file  . Attends Archivist Meetings: Not on file  . Marital Status: Not on file   Allergies   Allergen Reactions  . Akten [Lidocaine]   . Amlodipine Swelling and Other (See Comments)    Lower extremity swelling   . Augmentin [Amoxicillin-Pot Clavulanate] Other (See Comments)    Stomach Pain/cramps Did it involve swelling of the face/tongue/throat, SOB, or low BP? No Did it involve sudden or severe rash/hives, skin peeling, or any reaction on the inside of your mouth or nose? No Did you need to seek medical attention at a hospital or doctor's office? Unknown When did it last happen?unknown If all above answers are "NO", may proceed with cephalosporin use.    Family History  Problem Relation Age of Onset  . Hypertension Father   . Heart  disease Father        patient does not know details.   . Ovarian cancer Mother 44  . Cancer Sister        colon  . Heart disease Sister   . Cancer Sister   . Cancer Sister        hodgkin disease  . Breast cancer Daughter   . Cancer Daughter        breast    Current Outpatient Medications (Endocrine & Metabolic):  .  levothyroxine (SYNTHROID) 100 MCG tablet, Take 1 tablet daily before breakfast LABS OVERDUE  Current Outpatient Medications (Cardiovascular):  .  hydrALAZINE (APRESOLINE) 50 MG tablet, Take 1.5 tablets (75 mg total) by mouth 3 (three) times daily. TAKE 1 1/2 TABLETS THREE TIMES DAILY. .  metolazone (ZAROXOLYN) 2.5 MG tablet, Take 1 tablet (2.5 mg total) by mouth once a week. Every Wednesday .  metoprolol succinate (TOPROL-XL) 25 MG 24 hr tablet, Take 1 tablet (25 mg total) by mouth 2 (two) times daily. [name] Kitchen  torsemide (DEMADEX) 20 MG tablet, Take 4 tablets (80 mg total) by mouth 2 (two) times daily.  Current Outpatient Medications (Respiratory):  .  sodium chloride (OCEAN) 0.65 % SOLN nasal spray, Place 2 sprays into both nostrils 2 (two) times daily.   Current Outpatient Medications (Analgesics):  .  acetaminophen (TYLENOL) 325 MG tablet, Take 650 mg by mouth every 4 (four) hours as needed. .  fentaNYL (DURAGESIC)  12 MCG/HR, Place one patch onto the skin every 3 days. Remove old patch .  traMADol (ULTRAM) 50 MG tablet, TAKE ONE TABLET AT BEDTIME.  Current Outpatient Medications (Hematological):  [name] Kitchen  ELIQUIS 2.5 MG TABS tablet, TAKE 1 TABLET BY MOUTH TWICE DAILY.  Current Outpatient Medications (Other):  .  gabapentin (NEURONTIN) 100 MG capsule, Take 1 capsule (100 mg total) by mouth daily. .  hydroxyurea (HYDREA) 500 MG capsule, 2 capsules daily from Mondays to Fridays and 1 capsule daily on Saturdays and Sunday .  Magnesium 250 MG TABS, Take 250 mg by mouth daily.  .  Melatonin 10 MG TABS, Take 10 mg by mouth at bedtime. .  Multiple Vitamins-Minerals (PRESERVISION AREDS 2) CAPS, Take 1 capsule by mouth 2 (two) times daily.  .  potassium chloride SA (KLOR-CON) 20 MEQ tablet, Take 1 tablet (20 mEq total) by mouth once a week. Every Wednesday when you take Metolazone .  sennosides-docusate sodium (SENOKOT-S) 8.6-50 MG tablet, Take 2 tablets by mouth 2 (two) times daily.   Reviewed prior external information including notes and imaging from  primary care provider As well as notes that were available from care everywhere and other healthcare systems.  Past medical history, social, surgical and family history all reviewed in electronic medical record.  No pertanent information unless stated regarding to the chief complaint.   Review of Systems:  No headache, visual changes, nausea, vomiting, diarrhea, constipation, dizziness, abdominal pain, skin rash, fevers, chills, night sweats, weight loss, swollen lymph nodes, body aches, joint swelling, chest pain, shortness of breath, mood changes. POSITIVE muscle aches  Objective  Blood pressure 102/66, pulse 78, height 5\' 1"  (1.549 m), weight 106 lb (48.1 kg), SpO2 92 %.   General: No apparent distress alert and oriented x3 mood and affect normal, dressed appropriately.  HEENT: Pupils equal, extraocular movements intact  Respiratory: Patient's speak in full  sentences and does not appear short of breath  Cardiovascular: No lower extremity edema, non tender, no erythema  Patient ambulates with the aid of a  walker.  Does have significant arthritic changes. Noticed a mild tremor noted of the right upper extremity today. Right knee exam shows the patient lacks the last 5 degrees of extension and does have instability with valgus and varus force.  Lacks last 5 degrees of flexion.  Contralateral knee does have some mild tenderness to palpation as well.  After informed written and verbal consent, patient was seated on exam table. Right knee was prepped with alcohol swab and utilizing anterolateral approach, patient's right knee space was injected with 4:1  marcaine 0.5%: Kenalog 40mg /dL. Patient tolerated the procedure well without immediate complications.    Impression and Recommendations:     The above documentation has been reviewed and is accurate and complete Lyndal Pulley, DO

## 2020-01-14 NOTE — Telephone Encounter (Signed)
Message left on clinical intake voicemail:   Incoming call received from Autumn (clinic nurse) with [facility], requesting a return call regarding low blood pressure readings.   I returned call to Autumn and she has addressed concerned with patients cardiologist. Patient has an appointment tomorrow with Dr.Smith at 9:45 am and with Dr.Mclean at 10:30.  Patient will continue to Hold torsemide, hydralazine, and take 1//2 tablet of metoprolol as instructed by Dr.Reed on 01/11/2020.  I informed Autumn that patients daughter has also reached out to Dr.Reed via [employer].   Will forward to Dr.Reed as a [place]

## 2020-01-14 NOTE — Telephone Encounter (Signed)
Notified Autumn, Clinic Nurse, and she will contact Dr. Claris Gladden office for the BMET and BP Nurse Visit today.

## 2020-01-14 NOTE — Telephone Encounter (Signed)
Bp check and labs scheduled for tomorrow morning.

## 2020-01-15 ENCOUNTER — Other Ambulatory Visit: Payer: Self-pay

## 2020-01-15 ENCOUNTER — Encounter: Payer: Self-pay | Admitting: Family Medicine

## 2020-01-15 ENCOUNTER — Ambulatory Visit (HOSPITAL_COMMUNITY)
Admission: RE | Admit: 2020-01-15 | Discharge: 2020-01-15 | Disposition: A | Discharge status: Home / Self Care | Payer: Medicare Other | Source: Ambulatory Visit | Attending: Cardiology | Admitting: Cardiology

## 2020-01-15 ENCOUNTER — Ambulatory Visit (INDEPENDENT_AMBULATORY_CARE_PROVIDER_SITE_OTHER): Payer: Medicare Other | Admitting: Family Medicine

## 2020-01-15 ENCOUNTER — Encounter (INDEPENDENT_AMBULATORY_CARE_PROVIDER_SITE_OTHER): Payer: Medicare Other | Admitting: Ophthalmology

## 2020-01-15 VITALS — BP 98/64 | HR 68 | Wt 106.0 lb

## 2020-01-15 DIAGNOSIS — I5032 Chronic diastolic (congestive) heart failure: Secondary | ICD-10-CM | POA: Diagnosis not present

## 2020-01-15 DIAGNOSIS — M1711 Unilateral primary osteoarthritis, right knee: Secondary | ICD-10-CM

## 2020-01-15 LAB — BASIC METABOLIC PANEL
Anion gap: 9 (ref 5–15)
BUN: 78 mg/dL — ABNORMAL HIGH (ref 8–23)
CO2: 35 mmol/L — ABNORMAL HIGH (ref 22–32)
Calcium: 10.7 mg/dL — ABNORMAL HIGH (ref 8.9–10.3)
Chloride: 94 mmol/L — ABNORMAL LOW (ref 98–111)
Creatinine, Ser: 1.28 mg/dL — ABNORMAL HIGH (ref 0.44–1.00)
GFR, Estimated: 41 mL/min — ABNORMAL LOW (ref >60)
Glucose, Bld: 96 mg/dL (ref 70–99)
Potassium: 4 mmol/L (ref 3.5–5.1)
Sodium: 138 mmol/L (ref 135–145)

## 2020-01-15 NOTE — Assessment & Plan Note (Signed)
Patient given injection today and tolerated the procedure well, discussed icing regimen at home exercise, which activities to do which wants to avoid.  Discussed icing regimen.  Discussed topical anti-inflammatories.  Patient will increase activity slowly.  Patient will continue to walk with the aid of a walker.  Chronic problem with exacerbation.  Patient does not want any surgical intervention with the end-stage arthritic changes and we will monitor closely.  Follow-up with me again in 3 months

## 2020-01-15 NOTE — Progress Notes (Signed)
Patient seen in office for blood pressure check and lab work. Pt recently experiencing low blood pressure. Dr.Reed contacted on 12/3 and gave patient the following instructions "I've asked for her to hold hydralazine and torsemide tonight due to BP 98/62 up from 76/58 earlier.  Take half of her toprol xl tonight only to maintain her rate with her afib.  Asked for vitals to be rechecked in the am before anymore meds are given.  Nurse was waiting to hear back from cardiology." Pts bp today 98/64 per Dr.McLean stop hydralazine, take metoprolol 12.5mg  bid, hold torsemide today and tomorrow and restart at 80mg  daily may take an additional 40mg  daily as needed for weight gain/edema. Autumn,RN aware and will communicate with patient.

## 2020-01-15 NOTE — Patient Instructions (Signed)
STOP Hydralazine   DECREASE Metoprolol to 12.5mg  twice daily  HOLD Torsemide today and tomorrow then restart at 80mg  daily may take an additional 40mg  PRN for weight gain/edema.

## 2020-01-15 NOTE — Patient Instructions (Addendum)
Good to see you  Injection given today hope that helps [facility] You know where I am if you need me See me again in 3 months

## 2020-01-16 ENCOUNTER — Other Ambulatory Visit (HOSPITAL_COMMUNITY): Payer: Self-pay

## 2020-01-16 NOTE — Progress Notes (Signed)
Paramedicine Encounter    Patient ID: Sarah Mays, female    DOB: Feb 02, 1931, 86 y.o.   MRN: [id number]   Patient Care Team: Gayland Curry, DO as PCP - General (Geriatric Medicine) Larey Dresser, MD as PCP - Cardiology (Cardiology) [facility], Clent Demark, MD as Consulting Physician (Ophthalmology) Larey Dresser, MD as Consulting Physician (Cardiology) Heath Lark, MD as Consulting Physician (Hematology and Oncology) Clent Jacks, MD as Consulting Physician (Ophthalmology) Jerrell Belfast, MD as Consulting Physician (Otolaryngology)  Patient Active Problem List   Diagnosis Date Noted  . Exudative age-related macular degeneration of left eye with active choroidal neovascularization ([place]) 09/27/2019  . Retinal hemorrhage, left 09/27/2019  . Degenerative arthritis of right knee 08/02/2019  . Advanced nonexudative age-related macular degeneration of left eye with subfoveal involvement 07/17/2019  . Exudative age-related macular degeneration of left eye with inactive choroidal neovascularization ([place]) 07/17/2019  . Exudative age-related macular degeneration of right eye with active choroidal neovascularization ([place]) 05/17/2019  . Trichiasis without entropion of right lower eyelid 05/17/2019  . Neovascular glaucoma, right eye, severe stage 05/17/2019  . Baker's cyst of knee, unspecified laterality 12/06/2018  . Anemia in neoplastic disease 04/14/2018  . Neurogenic claudication due to lumbar spinal stenosis 01/28/2018  . Hydronephrosis with ureteropelvic junction (UPJ) obstruction 08/22/2017  . DDD (degenerative disc disease), cervical 08/09/2017  . Hypercoagulable state due to atrial fibrillation ([place]) 08/09/2017  . Presbycusis of both ears 08/03/2017  . Deficiency anemia 07/26/2017  . Epistaxis, recurrent 02/25/2017  . Pure hypercholesterolemia   . PONV (postoperative nausea and vomiting)   . PAF (paroxysmal atrial fibrillation) ([place])   . Neck  mass   . Insomnia, unspecified   . History of kidney stones   . Deafness   . Closed fracture of lower end of radius with ulna   . Arthritis   . Abnormal stress test   . Rotator cuff arthropathy, right 09/08/2016  . Trigger point of right shoulder region 05/05/2016  . Spondylosis of cervical region without myelopathy or radiculopathy 05/05/2016  . Chondrocalcinosis 05/05/2016  . Chronic pain disorder 05/05/2016  . A-fib ([place]) 01/08/2016  . Closed fracture of shaft of right femur, initial encounter ([place]) 10/25/2015  . Status post-operative repair of hip fracture 10/25/2015  . Greater trochanteric bursitis of right hip 09/29/2015  . Asymptomatic cholelithiasis 05/09/2015  . Hair loss 11/07/2014  . Bilateral leg edema 11/07/2014  . Constipation due to pain medication 11/06/2014  . Hypotension due to drugs 11/06/2014  . Chest pain 09/01/2014  . Osteoporosis 01/04/2014  . Gout 01/04/2014  . Headache 01/04/2014  . Hypothyroidism 11/19/2013  . Hypercalcemia 10/11/2013  . Insomnia 05/16/2013  . Lumbar back pain 05/16/2013  . Polycythemia vera ([place])   . Mass of right side of neck   . Senile osteoporosis   . Macular degeneration   . Sick sinus syndrome ([place]) 03/09/2012  . CAD (coronary artery disease) 12/27/2011  . Paroxysmal atrial fibrillation ([place]) 12/27/2011  . Chronic diastolic CHF (congestive heart failure) ([place]) 12/27/2011  . HTN (hypertension) 12/27/2011    Current Outpatient Medications:  .  acetaminophen (TYLENOL) 325 MG tablet, Take 650 mg by mouth every 4 (four) hours as needed., Disp: , Rfl:  .  ELIQUIS 2.5 MG TABS tablet, TAKE 1 TABLET BY MOUTH TWICE DAILY., Disp: 180 tablet, Rfl: 3 .  fentaNYL (DURAGESIC) 12 MCG/HR, Place one patch onto the skin every 3 days. Remove old patch, Disp: 10 patch, Rfl: 0 .  gabapentin (  NEURONTIN) 100 MG capsule, Take 1 capsule (100 mg total) by mouth daily., Disp: 90 capsule, Rfl: 3 .  hydroxyurea (HYDREA) 500 MG capsule, 2 capsules daily  from Mondays to Fridays and 1 capsule daily on Saturdays and Sunday, Disp: 100 capsule, Rfl: 3 .  levothyroxine (SYNTHROID) 100 MCG tablet, Take 1 tablet daily before breakfast LABS OVERDUE, Disp: 30 tablet, Rfl: 0 .  Magnesium 250 MG TABS, Take 250 mg by mouth daily. , Disp: , Rfl:  .  Melatonin 10 MG TABS, Take 10 mg by mouth at bedtime., Disp: , Rfl:  .  metolazone (ZAROXOLYN) 2.5 MG tablet, Take 1 tablet (2.5 mg total) by mouth once a week. Every Wednesday, Disp: 10 tablet, Rfl: 3 .  metoprolol succinate (TOPROL-XL) 25 MG 24 hr tablet, Take 1 tablet (25 mg total) by mouth 2 (two) times daily. (Patient taking differently: Take 25 mg by mouth 2 (two) times daily. ), Disp: 60 tablet, Rfl: 11 .  Multiple Vitamins-Minerals (PRESERVISION AREDS 2) CAPS, Take 1 capsule by mouth 2 (two) times daily. , Disp: , Rfl:  .  potassium chloride SA (KLOR-CON) 20 MEQ tablet, Take 1 tablet (20 mEq total) by mouth once a week. Every Wednesday when you take Metolazone, Disp: 10 tablet, Rfl: 3 .  sennosides-docusate sodium (SENOKOT-S) 8.6-50 MG tablet, Take 2 tablets by mouth 2 (two) times daily., Disp: , Rfl:  .  sodium chloride (OCEAN) 0.65 % SOLN nasal spray, Place 2 sprays into both nostrils 2 (two) times daily. , Disp: , Rfl:  .  torsemide (DEMADEX) 20 MG tablet, Take 4 tablets (80 mg total) by mouth 2 (two) times daily. (Patient taking differently: Take 80 mg by mouth 2 (two) times daily. ), Disp: 240 tablet, Rfl: 3 .  traMADol (ULTRAM) 50 MG tablet, TAKE ONE TABLET AT BEDTIME., Disp: 30 tablet, Rfl: 5 .  hydrALAZINE (APRESOLINE) 50 MG tablet, Take 1.5 tablets (75 mg total) by mouth 3 (three) times daily. TAKE 1 1/2 TABLETS THREE TIMES DAILY. (Patient not taking: Reported on 01/16/2020), Disp: 405 tablet, Rfl: 3 Allergies  Allergen Reactions  . Akten [Lidocaine]   . Amlodipine Swelling and Other (See Comments)    Lower extremity swelling   . Augmentin [Amoxicillin-Pot Clavulanate] Other (See Comments)     Stomach Pain/cramps Did it involve swelling of the face/tongue/throat, SOB, or low BP? No Did it involve sudden or severe rash/hives, skin peeling, or any reaction on the inside of your mouth or nose? No Did you need to seek medical attention at a hospital or doctor's office? Unknown When did it last happen?unknown If all above answers are "NO", may proceed with cephalosporin use.       Social History   Socioeconomic History  . Marital status: Married    Spouse name: [name]  . Number of children: 2  . Years of education: 21  . Highest education level: Not on file  Occupational History  . Not on file  Tobacco Use  . Smoking status: Former Smoker    Types: Cigarettes    Quit date: 01/19/1984    Years since quitting: 36.0  . Smokeless tobacco: Never Used  Vaping Use  . Vaping Use: Never used  Substance and Sexual Activity  . Alcohol use: Yes    Alcohol/week: 1.0 standard drink    Types: 1 Glasses of wine per week    Comment: Every evening   . Drug use: No  . Sexual activity: Not Currently  Other Topics Concern  .  Not on file  Social History Narrative   Patient is Married 1955. 2 kids. 52 grandkid-61 years old in 2016. College graduate [name] [place]).    Lives in apartment,  Independent Living  section at [facility] since 02/2013.      Stay at home mother.       No Smoking history, Mod. alcohol use.   Patient has a living will, POA      Hobbies: CPU and painting         Social Determinants of Health   Financial Resource Strain: Low Risk   . Difficulty of Paying Living Expenses: Not hard at all  Food Insecurity: No Food Insecurity  . Worried About Charity fundraiser in the Last Year: Never true  . Ran Out of Food in the Last Year: Never true  Transportation Needs: No Transportation Needs  . Lack of Transportation (Medical): No  . Lack of Transportation (Non-Medical): No  Physical Activity:   . Days of Exercise per Week: Not on file   . Minutes of Exercise per Session: Not on file  Stress:   . Feeling of Stress : Not on file  Social Connections:   . Frequency of Communication with Friends and Family: Not on file  . Frequency of Social Gatherings with Friends and Family: Not on file  . Attends Religious Services: Not on file  . Active Member of Clubs or Organizations: Not on file  . Attends Archivist Meetings: Not on file  . Marital Status: Not on file  Intimate Partner Violence:   . Fear of Current or Ex-Partner: Not on file  . Emotionally Abused: Not on file  . Physically Abused: Not on file  . Sexually Abused: Not on file    Physical Exam      Future Appointments  Date Time Provider [place]  01/17/2020 12:00 PM [facility] PA/NP [facility] None  02/12/2020  1:45 PM [place], Clent Demark, MD RDE-RDE None  03/18/2020  1:45 PM [place], Clent Demark, MD RDE-RDE None  03/25/2020  2:45 PM [facility] NURSE PSC-PSC None  04/10/2020 11:30 AM [facility] LAB [facility] None  04/10/2020 12:00 PM Heath Lark, MD [facility] None  04/15/2020 11:00 AM Lyndal Pulley, DO [facility] None    BP 118/80   Pulse (!) 20   Temp 98.7 F (37.1 C)   Resp 18   SpO2 96%   Weight yesterday-? Last visit weight-106   Last orders was to stop hydralazine, hold torsemide yesterday and today and restart on Thursday at 80mg  due to low b/p.  Metoprolol decreased to 12.5mg  BID Hold metolazone this week as well, resume next week.   Shes got a good grip on her meds. She has her own system and that works for her. She had nurse check her b/p earlier but she cant remember what she got.  She has her transportation in place, understands her meds. She has clinic in the facility that is available for her as well as the nurse comes around and checks her b/p and is available to her 24/7 and has made contact with the CHF clinic when needed.  She has all resources that is needed at the time.  I have advised her that if another visit was needed then I  would call her, but right now she does not appear to need paramedicine services but I will f/u after her clinic visit tomor.    Marylouise Stacks, [facility] Paramedic  01/16/20

## 2020-01-17 ENCOUNTER — Ambulatory Visit (HOSPITAL_COMMUNITY)
Admission: RE | Admit: 2020-01-17 | Discharge: 2020-01-17 | Disposition: A | Discharge status: Home / Self Care | Payer: Medicare Other | Source: Ambulatory Visit | Attending: Cardiology | Admitting: Cardiology

## 2020-01-17 ENCOUNTER — Encounter (HOSPITAL_COMMUNITY): Payer: Self-pay

## 2020-01-17 ENCOUNTER — Ambulatory Visit: Payer: Medicare Other | Admitting: Family Medicine

## 2020-01-17 ENCOUNTER — Other Ambulatory Visit: Payer: Self-pay

## 2020-01-17 VITALS — BP 137/95 | HR 97 | Wt 106.8 lb

## 2020-01-17 DIAGNOSIS — Z87891 Personal history of nicotine dependence: Secondary | ICD-10-CM | POA: Diagnosis not present

## 2020-01-17 DIAGNOSIS — Z79890 Hormone replacement therapy (postmenopausal): Secondary | ICD-10-CM | POA: Insufficient documentation

## 2020-01-17 DIAGNOSIS — I5032 Chronic diastolic (congestive) heart failure: Secondary | ICD-10-CM | POA: Diagnosis not present

## 2020-01-17 DIAGNOSIS — Z881 Allergy status to other antibiotic agents status: Secondary | ICD-10-CM | POA: Diagnosis not present

## 2020-01-17 DIAGNOSIS — I081 Rheumatic disorders of both mitral and tricuspid valves: Secondary | ICD-10-CM | POA: Insufficient documentation

## 2020-01-17 DIAGNOSIS — Z8249 Family history of ischemic heart disease and other diseases of the circulatory system: Secondary | ICD-10-CM | POA: Diagnosis not present

## 2020-01-17 DIAGNOSIS — I110 Hypertensive heart disease with heart failure: Secondary | ICD-10-CM | POA: Diagnosis not present

## 2020-01-17 DIAGNOSIS — I4821 Permanent atrial fibrillation: Secondary | ICD-10-CM | POA: Insufficient documentation

## 2020-01-17 DIAGNOSIS — Z7901 Long term (current) use of anticoagulants: Secondary | ICD-10-CM | POA: Insufficient documentation

## 2020-01-17 DIAGNOSIS — Z888 Allergy status to other drugs, medicaments and biological substances status: Secondary | ICD-10-CM | POA: Insufficient documentation

## 2020-01-17 DIAGNOSIS — Z880 Allergy status to penicillin: Secondary | ICD-10-CM | POA: Diagnosis not present

## 2020-01-17 DIAGNOSIS — Z79891 Long term (current) use of opiate analgesic: Secondary | ICD-10-CM | POA: Diagnosis not present

## 2020-01-17 DIAGNOSIS — R001 Bradycardia, unspecified: Secondary | ICD-10-CM | POA: Insufficient documentation

## 2020-01-17 DIAGNOSIS — Z79899 Other long term (current) drug therapy: Secondary | ICD-10-CM | POA: Insufficient documentation

## 2020-01-17 LAB — CBC
HCT: 34.9 % — ABNORMAL LOW (ref 36.0–46.0)
Hemoglobin: 11.2 g/dL — ABNORMAL LOW (ref 12.0–15.0)
MCH: 38.6 pg — ABNORMAL HIGH (ref 26.0–34.0)
MCHC: 32.1 g/dL (ref 30.0–36.0)
MCV: 120.3 fL — ABNORMAL HIGH (ref 80.0–100.0)
Platelets: 406 10*3/uL — ABNORMAL HIGH (ref 150–400)
RBC: 2.9 MIL/uL — ABNORMAL LOW (ref 3.87–5.11)
RDW: 15.7 % — ABNORMAL HIGH (ref 11.5–15.5)
WBC: 9.3 10*3/uL (ref 4.0–10.5)
nRBC: 0 % (ref 0.0–0.2)

## 2020-01-17 LAB — BASIC METABOLIC PANEL
Anion gap: 11 (ref 5–15)
BUN: 63 mg/dL — ABNORMAL HIGH (ref 8–23)
CO2: 32 mmol/L (ref 22–32)
Calcium: 11.2 mg/dL — ABNORMAL HIGH (ref 8.9–10.3)
Chloride: 98 mmol/L (ref 98–111)
Creatinine, Ser: 1.27 mg/dL — ABNORMAL HIGH (ref 0.44–1.00)
GFR, Estimated: 41 mL/min — ABNORMAL LOW (ref >60)
Glucose, Bld: 92 mg/dL (ref 70–99)
Potassium: 3.8 mmol/L (ref 3.5–5.1)
Sodium: 141 mmol/L (ref 135–145)

## 2020-01-17 MED ORDER — TORSEMIDE 20 MG PO TABS: 80.0000 mg | ORAL_TABLET | Freq: Every day | ORAL | 3 refills | Status: AC

## 2020-01-17 NOTE — Progress Notes (Signed)
ID:  Sarah Mays, DOB Sep 03, 1930, MRN [id number]   Provider location: [facility] Type of Visit: Established patient  PCP:  Gayland Curry, DO  Cardiologist:  Loralie Champagne, MD   History of Present Illness: Sarah Mays is a 86 y.o. female who has a history of paroxysmal atrial fibrillation and chronic diastolic CHF.  In 7/16, she was admitted with chest pain.  Cardiolite showed no ischemia or infarction.  Echo in 6/17 showed EF 60-65%.  She developed problems elevated HR when in atrial fibrillation but bradycardia when in NSR.  She underwent atrial fibrillation ablation in 11/17.  She is tolerating Xarelto without melena or BRBPR.    Echo in 11/18 showed EF 60-65%, PASP 47 mmHg.   TEE-guided DCCV back to NSR in 6/19.  TEE showed EF 55-60% with moderate-severe MR.   She developed multiple episodes of epistaxis with Xarelto use.  We have transitioned her to apixaban 2.5 mg bid and she has not had epistaxis since then.   Echo in 6/20 showed EF 60-65%, normal RV, mild-moderate MR.   She had recurrent atrial fibrillation with DCCV to NSR in 6/20.  She then had atypical flutter with DCCV to NSR in 8/20. Symptomatically worse when in atrial fibrillation/flutter. At a prior visit, she was in junctional rhythm. Zio patch x 3 days in 9/20 showed 51% atrial flutter with controlled rate. She saw Dr. Rayann Heman and they decided against repeat ablation. She has been in atrial fibrillation and flutter chronically since then, amiodarone was stopped since it did not keep her NSR.  Echo was done today and reviewed, EF 60-65%, mildly decreased RV systolic function with D-shaped interventricular septum, PASP 68, moderate MR, severe TR.   Recently seen 11/21 for f/u and wt was up 2 lb w/ bilateral LEE and increased DOE. She was instructed to take 2 doses of metolazone that week and continued on torsemide 80 mg bid. Thereafter, she was to continue metolazone once weekly.    Unfortunately, she developed hypotension w/ SBP in the 70s. She was instructed to stop weekly metolazone and also instructed to stop hydralazine. She was advised to hold torsemide x 1 day then to resume at lower dose of just 80 mg once daily w/ instructions to take an extra 40 mg PRN if any significant wt gain or edema.   She presents back for f/u. Wt is back down 2 lb since last OV. BP ok at 137/95. Pulse rate 97 bpm. She denies orthostatic symptoms. Notes chronic exertional dyspnea at baseline but not any worse after diuretic dose reduction. She is in chronic afib. No orthopnea/ PND. ReDs Clip normal at 34%.   Labs (7/16): K 4, creatinine 0.94, calcium 10.8, LFTs normal Labs (9/16): K 4, creatinine 0.9, LDL 69, TSH normal, HCT 42.3 Labs (5/17): K 4.6, creatinine 0.94, HCT 41.5, LFTs normal. Labs (6/17): K 5, creatinine 1.04, LDL 42, HDL 101, TSH normal Labs (12/17): K 3.7, creatinine 0.76, hgb 11.2 Labs (4/18): K 4.6, creatinine 0.91 Labs (10/18): K 4.5, creatinine 0.62 Labs (12/18): hgb 13.5 Labs (1/19): K 4.3, creatinine 0.6 Labs (4/19): hgb 13.5 Labs (7/19): K 4.5, creatinine 0.5, hgb 12.1 Labs (11/19): K 3.7, creatinine 0.95 Labs (12/19): K 3.6, creatinine 0.9, hgb 11.6 Labs (6/20): K 3.7, creatinine 1.01 => 1.17 Labs (9/20): K 4.1, creatinine 1.17 Labs (3/21): K 3.7, creatinine 1.27 Labs (5/21): K 3.8, creatinine 1.4, hgb 10.8, bnp 549 Labs (7/21): K 3.9, creatinine 1.16 Labs (  11/21): K 4.4, creatinine 1.3, BNP 542  PMH: 1. Atrial fibrillation/flutter: Diagnosed initially in 10/13.  Holter monitor in 11/13 showed atrial fibrillation with average rate 65.  - Atrial fibrillation ablation in 11/17.  - TEE-guided DCCV in 6/19.  - atrial fibrillation with DCCV to NSR in 6/20.  - atypical atrial flutter with DCCV to NSR in 8/20.  - Zio patch x 3 days (9/20): 51% atrial flutter - Now chronic 2. Chronic diastolic CHF: Echo (09/38) with EF 65-70%, mild MR, moderate biatrial  enlargement, moderate TR, PA systolic pressure 45 mmHg.   Echo (10/15): EF 60-65%.  Echo (7/16) with EF 60-65%, mild MR.  - Echo (6/17): EF 60-65%, PASP 41 mmHg.  - Echo (11/18): EF 60-65%, PASP 47 mmHg - TEE (6/19): EF 55-60%, mild LVH, mildly decreased RV systolic function, peak RV-RA gradient 40 mmHg, moderate-severe MR with bileaflet MVP, ERO 0.37 cm^2.  - Echo (6/20): EF 60-65%, normal RV, mild-moderate MR, severe biatrial enlargement.  - Echo (11/21): EF 60-65%, mildly decreased RV systolic function with D-shaped interventricular septum, PASP 68, moderate MR, severe TR. 3. ETT-Sestamibi (11/13): Exercised to stage II, small partially reversible anteroapical perfusion defect suggesting mild ischemia versus attenuation, EF 80%.  Cardiolite (7/16) with EF 64%, no ischemia or infarction.  4. HTN: Lower extremity swelling with amlodipine.  5. Polycythemia vera: Has had phlebotomy only once.  6. Hypothyroidism 7. Macular degeneration 8. TAH 1980 9. Familial hypocalciuric hypercalcemia 10. PFTs (amiodarone use) in 1/14 showed a mild obstructive defect.  11. Degenerative disc disease 12. Sick sinus syndrome: Has not required PPM. Junctional rhythm has been noted in the past.  13. Mitral regurgitation: TEE (6/19) with moderate-severe MR with bileaflet MVP, ERO 0.37 cm^2.  - Echo (6/20) with mild-moderate MR.   Current Outpatient Medications  Medication Sig Dispense Refill  . acetaminophen (TYLENOL) 325 MG tablet Take 650 mg by mouth every 4 (four) hours as needed.    [name] Kitchen ELIQUIS 2.5 MG TABS tablet TAKE 1 TABLET BY MOUTH TWICE DAILY. 180 tablet 3  . fentaNYL (DURAGESIC) 12 MCG/HR Place one patch onto the skin every 3 days. Remove old patch 10 patch 0  . gabapentin (NEURONTIN) 100 MG capsule Take 1 capsule (100 mg total) by mouth daily. 90 capsule 3  . hydrALAZINE (APRESOLINE) 50 MG tablet Take 1.5 tablets (75 mg total) by mouth 3 (three) times daily. TAKE 1 1/2 TABLETS THREE TIMES DAILY. (Patient  not taking: Reported on 01/16/2020) 405 tablet 3  . hydroxyurea (HYDREA) 500 MG capsule 2 capsules daily from Mondays to Fridays and 1 capsule daily on Saturdays and Sunday 100 capsule 3  . levothyroxine (SYNTHROID) 100 MCG tablet Take 1 tablet daily before breakfast LABS OVERDUE 30 tablet 0  . Magnesium 250 MG TABS Take 250 mg by mouth daily.     . Melatonin 10 MG TABS Take 10 mg by mouth at bedtime.    . metolazone (ZAROXOLYN) 2.5 MG tablet Take 1 tablet (2.5 mg total) by mouth once a week. Every Wednesday 10 tablet 3  . metoprolol succinate (TOPROL-XL) 25 MG 24 hr tablet Take 1 tablet (25 mg total) by mouth 2 (two) times daily. (Patient taking differently: Take 25 mg by mouth 2 (two) times daily. ) 60 tablet 11  . Multiple Vitamins-Minerals (PRESERVISION AREDS 2) CAPS Take 1 capsule by mouth 2 (two) times daily.     . potassium chloride SA (KLOR-CON) 20 MEQ tablet Take 1 tablet (20 mEq total) by mouth once a week.  Every Wednesday when you take Metolazone 10 tablet 3  . sennosides-docusate sodium (SENOKOT-S) 8.6-50 MG tablet Take 2 tablets by mouth 2 (two) times daily.    . sodium chloride (OCEAN) 0.65 % SOLN nasal spray Place 2 sprays into both nostrils 2 (two) times daily.     [name] Kitchen torsemide (DEMADEX) 20 MG tablet Take 4 tablets (80 mg total) by mouth 2 (two) times daily. (Patient taking differently: Take 80 mg by mouth 2 (two) times daily. ) 240 tablet 3  . traMADol (ULTRAM) 50 MG tablet TAKE ONE TABLET AT BEDTIME. 30 tablet 5   No current facility-administered medications for this encounter.    Allergies:   Akten [lidocaine], Amlodipine, and Augmentin [amoxicillin-pot clavulanate]   Social History:  The patient  reports that she quit smoking about 36 years ago. Her smoking use included cigarettes. She has never used smokeless tobacco. She reports current alcohol use of about 1.0 standard drink of alcohol per week. She reports that she does not use drugs.   Family History:  The patient's family  history includes Breast cancer in her daughter; Cancer in her daughter, sister, sister, and sister; Heart disease in her father and sister; Hypertension in her father; Ovarian cancer (age of onset: 70) in her mother.   ROS:  Please see the history of present illness.   All other systems are personally reviewed and negative.   Exam:   BP (!) 137/95   Pulse 97   Wt 48.4 kg (106 lb 12.8 oz)   SpO2 95%   BMI 20.18 kg/m  PHYSICAL EXAM: ReDS Clip 34% General:  Well appearing, elderly female. No respiratory difficulty HEENT: normal Neck: supple. no JVD. Carotids 2+ bilat; no bruits. No lymphadenopathy or thyromegaly appreciated. Cor: PMI nondisplaced. Irregularly irregular rhythm. No rubs, gallops or murmurs. Lungs: clear Abdomen: soft, nontender, nondistended. No hepatosplenomegaly. No bruits or masses. Good bowel sounds. Extremities: no cyanosis, clubbing, rash, edema + bilateral ted hoses  Neuro: alert & oriented x 3, cranial nerves grossly intact. moves all 4 extremities w/o difficulty. Affect pleasant.   Recent Labs: 06/19/2019: ALT 19 12/27/2019: Brain Natriuretic Peptide 542 01/10/2020: Hemoglobin 11.4; Platelets 393 01/15/2020: BUN 78; Creatinine, Ser 1.28; Potassium 4.0; Sodium 138  Personally reviewed   Wt Readings from Last 3 Encounters:  01/17/20 48.4 kg (106 lb 12.8 oz)  01/15/20 48.1 kg (106 lb)  01/15/20 48.1 kg (106 lb)      ASSESSMENT AND PLAN:  1. Atrial fibrillation/flutter: First noted in 10/13.  Atrial fibrillation has triggered acute on chronic diastolic CHF in the past.  CHADSVASC score is 25 (age, female gender, HTN, CHF).  She is anticoagulated with apixaban 2.5 mg bid which is appropriate for her age/weight.  She had atrial fibrillation ablation in 11/17.  TEE-guided DCCV in 6/19.  Cardioversion in 6/20 for atrial fibrillation and again in 8/20 for atypical atrial flutter.  Zio patch in 9/20 showed 51% atrial flutter. She saw Dr. Rayann Heman who thought rate-control  would probably be the best strategy, recommended against repeat ablation.  She is now off amiodarone and in chronic atrial fibrillation.  - Adequate rate control with Toprol XL 25 mg bid.  - Continue apixaban 2.5 mg bid. Denies abnormal bleeding. Will check CBC today  2. Chronic diastolic CHF:  Echo in 62/70 shows EF 60-65%, mildly decreased RV systolic function with D-shaped interventricular septum, PASP 68, moderate MR, severe TR.   Symptomatically worse since she has been in permanent atrial fibrillation.  Has prominent RV  dysfunction with severe TR.  NYHA class III symptoms, chronic. Volume status ok on exam today after diuretic dose adjustment (made due to hypotension). ReDs Clip normal at 34%.  - Continue on reduced dose of torsemide 80 mg once daily (may take an extra 40 mg PRN if wt gain/ redevelopment of LEE) - once weekly metolazone recently discontinued due to hypotenion   - Encouraged continued use of compression stockings during the day and leg elevation when possible to reduce risk of LEE (suspsect component of venous insuffiencey contributing)  3. HTN: BP improved/ controlled today. Recent issues w/ hypotension, suspected due to over diuresis on high dose diuretics.  - hypotension resolved w/ med dose reduction - check BMP to check renal indices and CBC to r/o anemia    4. Bradycardia: Suspect she has a degree of sick sinus syndrome. Prior junctional rhythm.  No recent bradyarrhythmias.  - Follow rhythm closely on Toprol XL. Will not increase dose given risk for bradycardia    5. Mitral regurgitation: Moderate on recent echo, possibly due to dilated MV annulus with chronic atrial fibrillation and LA dilation.  - continue to monitor  6. Tricuspid regurgitation: Severe.   F/u again in 4-6 weeks  Signed, Lyda Jester, PA-C  01/17/2020  [facility] [address] [facility] [place] [address] [phone]  (office) [phone] (fax)

## 2020-01-17 NOTE — Progress Notes (Signed)
ReDS Vest / Clip - 01/17/20 1200      ReDS Vest / Clip   Station Marker A    Ruler Value 27    ReDS Value Range Low volume    ReDS Actual Value 36    Anatomical Comments sitting

## 2020-01-17 NOTE — Patient Instructions (Signed)
It was great to see you today! No medication changes are needed at this time.   Labs today We will only contact you if something comes back abnormal or we need to make some changes. Otherwise no news is good news!  Your physician recommends that you schedule a follow-up appointment in: 6-8 weeks  If you have any questions or concerns before your next appointment please send [place] a message through [place] or call our office at [phone].    TO LEAVE A MESSAGE FOR THE NURSE SELECT OPTION 2, PLEASE LEAVE A MESSAGE INCLUDING: . YOUR NAME . DATE OF BIRTH . CALL BACK NUMBER . REASON FOR CALL**this is important as we prioritize the call backs  YOU WILL RECEIVE A CALL BACK THE SAME DAY AS LONG AS YOU CALL BEFORE 4:00 PM

## 2020-01-21 ENCOUNTER — Telehealth (HOSPITAL_COMMUNITY): Payer: Self-pay

## 2020-01-21 NOTE — Telephone Encounter (Signed)
Patient is now discharged from [facility].  Patient has/has not met the following goals:  Yes :Patient expresses basic understanding of medications and what they are for Yes :Patient able to verbalize heart failure specific dietary/fluid restrictions Yes :Patient is aware of who to call if they have medical concerns or if they need to schedule or change appts Yes :Patient has a scale for daily weights and weighs regularly Yes :Patient able to verbalize concerning symptoms when they should call the HF clinic (weight gain ranges, etc) Yes :Patient has a PCP and has seen within the past year or has upcoming appt Yes :Patient has reliable access to getting their medications Yes :Patient has shown they are able to reorder medications reliably No :Patient has had admission in past 30 days- if yes how many? No :Patient has had admission in past 90 days- if yes how many?  Discharge Comments:  [facility] is in retirement community that has a clinic open daily and also a nurse that is around very frequently and pt can call them at any time to come be checked on.  She showed great competence in her meds and the med changes that was done recently.  No need for paramedicine at this time.    Marylouise Stacks, EMT-Paramedic 01/21/20

## 2020-01-28 ENCOUNTER — Other Ambulatory Visit: Payer: Self-pay

## 2020-01-28 DIAGNOSIS — M545 Low back pain, unspecified: Secondary | ICD-10-CM

## 2020-01-28 MED ORDER — FENTANYL 12 MCG/HR TD PT72: MEDICATED_PATCH | TRANSDERMAL | 0 refills | Status: DC

## 2020-01-28 NOTE — Telephone Encounter (Signed)
Patient called to get a refill on her fentanyl patch because she was on her last one she said, refill request sent to Dr Mariea Clonts

## 2020-01-29 NOTE — Addendum Note (Signed)
Encounter addended by: Jorge Ny, LCSW on: 01/29/2020 8:44 AM  Actions taken: Flowsheet accepted

## 2020-02-10 ENCOUNTER — Other Ambulatory Visit: Payer: Self-pay | Admitting: Internal Medicine

## 2020-02-11 NOTE — Telephone Encounter (Signed)
rx sent to pharmacy by e-script  

## 2020-02-12 ENCOUNTER — Ambulatory Visit (INDEPENDENT_AMBULATORY_CARE_PROVIDER_SITE_OTHER): Payer: Medicare Other | Admitting: Ophthalmology

## 2020-02-12 ENCOUNTER — Other Ambulatory Visit: Payer: Self-pay

## 2020-02-12 ENCOUNTER — Encounter (INDEPENDENT_AMBULATORY_CARE_PROVIDER_SITE_OTHER): Payer: Self-pay | Admitting: Ophthalmology

## 2020-02-12 DIAGNOSIS — H353221 Exudative age-related macular degeneration, left eye, with active choroidal neovascularization: Secondary | ICD-10-CM | POA: Diagnosis not present

## 2020-02-12 DIAGNOSIS — H353211 Exudative age-related macular degeneration, right eye, with active choroidal neovascularization: Secondary | ICD-10-CM | POA: Diagnosis not present

## 2020-02-12 MED ORDER — BEVACIZUMAB 2.5 MG/0.1ML IZ SOSY
2.5000 mg | PREFILLED_SYRINGE | INTRAVITREAL | Status: AC | PRN
  Administered 2020-02-12: 2.5 mg via INTRAVITREAL

## 2020-02-12 NOTE — Assessment & Plan Note (Signed)
Stable OD currently at 5-week follow-up.  Post Avastin.  Scheduled for 8-week total follow-up late next as scheduled

## 2020-02-12 NOTE — Assessment & Plan Note (Signed)
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  Currently at 64-month follow-up.  Repeat injection today and examination in 4 months OU

## 2020-02-12 NOTE — Progress Notes (Signed)
02/12/2020     CHIEF COMPLAINT Patient presents for Retina Follow Up (4 Month F/U OS, poss Avastin OS//Pt denies noticeable changes to [facility] OU since last visit. Pt denies ocular pain, flashes of light, or floaters OU. //)   HISTORY OF PRESENT ILLNESS: Sarah Mays is a 87 y.o. female who presents to the clinic today for:   HPI    Retina Follow Up    Patient presents with  Wet AMD.  In left eye.  This started 4 months ago.  Severity is moderate.  Duration of 4 months.  Since onset it is stable. Additional comments: 4 Month F/U OS, poss Avastin OS  Pt denies noticeable changes to [facility] OU since last visit. Pt denies ocular pain, flashes of light, or floaters OU.          Last edited by Ileana RoupKennerly, Paige, COA on 02/12/2020  1:23 PM. (History)      Referring physician: Kermit Baloeed, Tiffany L, DO [address] [place] [id number]  HISTORICAL INFORMATION:   Selected notes from the MEDICAL RECORD NUMBER       CURRENT MEDICATIONS: No current outpatient medications on file. (Ophthalmic Drugs)   No current facility-administered medications for this visit. (Ophthalmic Drugs)   Current Outpatient Medications (Other)  Medication Sig  . acetaminophen (TYLENOL) 325 MG tablet Take 650 mg by mouth every 4 (four) hours as needed.  [name] Kitchen. ELIQUIS 2.5 MG TABS tablet TAKE 1 TABLET BY MOUTH TWICE DAILY.  . fentaNYL (DURAGESIC) 12 MCG/HR Place one patch onto the skin every 3 days. Remove old patch  . gabapentin (NEURONTIN) 100 MG capsule Take 1 capsule (100 mg total) by mouth daily.  . hydroxyurea (HYDREA) 500 MG capsule 2 capsules daily from Mondays to Fridays and 1 capsule daily on Saturdays and Sunday  . levothyroxine (SYNTHROID) 100 MCG tablet TAKE 1 TABLET ONCE DAILY BEFORE BREAKFAST.  . Magnesium 250 MG TABS Take 250 mg by mouth daily.   . Melatonin 10 MG TABS Take 10 mg by mouth at bedtime.  . metoprolol succinate (TOPROL-XL) 25 MG 24 hr tablet Take 1 tablet (25 mg total) by mouth 2 (two) times  daily. (Patient taking differently: Take 25 mg by mouth 2 (two) times daily.)  . Multiple Vitamins-Minerals (PRESERVISION AREDS 2) CAPS Take 1 capsule by mouth 2 (two) times daily.   . potassium chloride SA (KLOR-CON) 20 MEQ tablet Take 1 tablet (20 mEq total) by mouth once a week. Every Wednesday when you take Metolazone  . sennosides-docusate sodium (SENOKOT-S) 8.6-50 MG tablet Take 2 tablets by mouth 2 (two) times daily.  . sodium chloride (OCEAN) 0.65 % SOLN nasal spray Place 2 sprays into both nostrils 2 (two) times daily.  [name] Kitchen. torsemide (DEMADEX) 20 MG tablet Take 4 tablets (80 mg total) by mouth daily.  . traMADol (ULTRAM) 50 MG tablet TAKE ONE TABLET AT BEDTIME.   No current facility-administered medications for this visit. (Other)      REVIEW OF SYSTEMS:    ALLERGIES Allergies  Allergen Reactions  . Akten [Lidocaine]   . Amlodipine Swelling and Other (See Comments)    Lower extremity swelling   . Augmentin [Amoxicillin-Pot Clavulanate] Other (See Comments)    Stomach Pain/cramps Did it involve swelling of the face/tongue/throat, SOB, or low BP? No Did it involve sudden or severe rash/hives, skin peeling, or any reaction on the inside of your mouth or nose? No Did you need to seek medical attention at a hospital or doctor's  office? Unknown When did it last happen?unknown If all above answers are "NO", may proceed with cephalosporin use.     PAST MEDICAL HISTORY Past Medical History:  Diagnosis Date  . Abnormal stress test    a. 11/2011 Ex MV: EF 80%, small, partially reversible anteroapical defect->mild ischemia vs attenuation-->med Rx.  [name] Kitchen Alopecia, unspecified   . Arthritis   . Chronic diastolic CHF (congestive heart failure) ([place])    a. 11/2013 Echo: EF 60-65%, no rwma, mild TR, PASP 72mmHg.  . Closed fracture of lower end of radius with ulna   . Deafness    Left, s/p multiple surgeries  . Essential hypertension   . Gouty arthropathy, unspecified   .  History of kidney stones   . Hypothyroidism   . Insomnia, unspecified   . Macular degeneration    Left, s/p inj. tx  . Mitral valve disorders(424.0)   . Neck mass    right, w/u including MRI negative  . PAF (paroxysmal atrial fibrillation) (HCC)    a. on amio (02/2012 mild obstruction on PFT's) and xarelto.  . Pneumonia   . Polycythemia vera(238.4)   . Pure hypercholesterolemia   . Senile osteoporosis    Reclast in the past  . Tricuspid valve disorders, specified as nonrheumatic    Past Surgical History:  Procedure Laterality Date  . BREAST BIOPSY  06/18/1998   left  . CARDIOVERSION N/A 07/15/2017   Procedure: CARDIOVERSION;  Surgeon: Larey Dresser, MD;  Location: [facility] ENDOSCOPY;  Service: Cardiovascular;  Laterality: N/A;  . CARDIOVERSION N/A 07/21/2018   Procedure: CARDIOVERSION;  Surgeon: Larey Dresser, MD;  Location: [facility] ENDOSCOPY;  Service: Cardiovascular;  Laterality: N/A;  . CARDIOVERSION N/A 10/06/2018   Procedure: CARDIOVERSION;  Surgeon: Larey Dresser, MD;  Location: [facility] ENDOSCOPY;  Service: Cardiovascular;  Laterality: N/A;  . ELECTROPHYSIOLOGIC STUDY N/A 01/08/2016   Procedure: Atrial Fibrillation Ablation;  Surgeon: Thompson Grayer, MD;  Location: [place] CV LAB;  Service: Cardiovascular;  Laterality: N/A;  . FEMUR IM NAIL Right 10/08/2015   Procedure: INTRAMEDULLARY (IM) NAIL FEMORAL RIGHT;  Surgeon: Paralee Cancel, MD;  Location: [facility] ORS;  Service: Orthopedics;  Laterality: Right;  . MASS EXCISION Right 11/26/2016   Procedure: EXCISION RIGHT LATERAL NECK MASS;  Surgeon: Jerrell Belfast, MD;  Location: [place];  Service: ENT;  Laterality: Right;  . TEE WITHOUT CARDIOVERSION N/A 07/15/2017   Procedure: TRANSESOPHAGEAL ECHOCARDIOGRAM (TEE);  Surgeon: Larey Dresser, MD;  Location: [facility] ENDOSCOPY;  Service: Cardiovascular;  Laterality: N/A;  . TONSILLECTOMY    . TOTAL ABDOMINAL HYSTERECTOMY    . TYMPANOPLASTY Bilateral     FAMILY HISTORY Family History  Problem Relation  Age of Onset  . Hypertension Father   . Heart disease Father        patient does not know details.   . Ovarian cancer Mother 75  . Cancer Sister        colon  . Heart disease Sister   . Cancer Sister   . Cancer Sister        hodgkin disease  . Breast cancer Daughter   . Cancer Daughter        breast    SOCIAL HISTORY Social History   Tobacco Use  . Smoking status: Former Smoker    Types: Cigarettes    Quit date: 01/19/1984    Years since quitting: 36.0  . Smokeless tobacco: Never Used  Vaping Use  . Vaping Use: Never used  Substance Use Topics  . Alcohol  use: Yes    Alcohol/week: 1.0 standard drink    Types: 1 Glasses of wine per week    Comment: Every evening   . Drug use: No         OPHTHALMIC EXAM: Base Eye Exam    Visual Acuity (ETDRS)      Right Left   Dist cc 20/40 -1 20/400ecc   Dist ph cc NI NI   Correction: Glasses       Tonometry (Tonopen, 1:25 PM)      Right Left   Pressure 17 19       Pupils      Dark Light Shape React APD   Right 4 3 Round Brisk None   Left 4 4 Irregular Minimal None       Visual Fields (Counting fingers)      Left Right    Full    Restrictions  Partial outer inferior temporal, inferior nasal deficiencies       Extraocular Movement      Right Left    Full Full       Neuro/Psych    Oriented x3: Yes   Mood/Affect: Normal       Dilation    Left eye: 1.0% Mydriacyl, 2.5% Phenylephrine @ 1:33 PM        Slit Lamp and Fundus Exam    External Exam      Right Left   External Normal Normal       Slit Lamp Exam      Right Left   Lids/Lashes Normal Normal   Conjunctiva/Sclera White and quiet White and quiet   Cornea Clear Clear   Anterior Chamber Deep and quiet Deep and quiet   Iris Round and reactive Round and reactive   Lens Posterior chamber intraocular lens  sutured Posterior chamber intraocular lens   Anterior Vitreous Normal vitrectomized       Fundus Exam      Right Left   Posterior Vitreous   vitrectomized   Disc  Normal   C/D Ratio  0.45   Macula  Disciform scar with less subretinal hemorrhage inferiorly at the edge of the previous CNVM., Geographic atrophy accounts for the acuity   Vessels  Normal   Periphery  Normal          IMAGING AND PROCEDURES  Imaging and Procedures for 02/12/20  OCT, Retina - OU - Both Eyes       Right Eye Quality was good. Scan locations included subfoveal. Central Foveal Thickness: 224. Progression has been stable. Findings include central retinal atrophy, outer retinal atrophy.   Left Eye Quality was good. Scan locations included subfoveal. Central Foveal Thickness: 411. Progression has been stable. Findings include disciform scar, central retinal atrophy, outer retinal atrophy, subretinal scarring.        Intravitreal Injection, Pharmacologic Agent - OS - Left Eye       Time Out 02/12/2020. 3:02 PM. Confirmed correct patient, procedure, site, and patient consented.   Anesthesia Topical anesthesia was used. Anesthetic medications included Lidocaine 4%.   Procedure Preparation included 5% betadine to ocular surface, 10% betadine to eyelids. A 30 gauge needle was used.   Injection:  2.5 mg Bevacizumab (AVASTIN) 2.5mg /0.36mL SOSY   NDC: 40086-761-95, Lot: [id number]   Route: Intravitreal, Site: Left Eye  Post-op Post injection exam found visual acuity of at least counting fingers. The patient tolerated the procedure well. There were no complications. The patient received written and verbal post procedure care education. Post  injection medications were not given.                 ASSESSMENT/PLAN:  Exudative age-related macular degeneration of right eye with active choroidal neovascularization (HCC) Stable OD currently at 5-week follow-up.  Post Avastin.  Scheduled for 8-week total follow-up late next as scheduled  Exudative age-related macular degeneration of left eye with active choroidal neovascularization ([place]) Left eye  with large subfoveal disciform scar generalized atrophy in the macula.  Currently at 42-month follow-up.  Repeat injection today and examination in 4 months OU      ICD-10-CM   1. Exudative age-related macular degeneration of left eye with active choroidal neovascularization (HCC)  H35.3221 OCT, Retina - OU - Both Eyes    Intravitreal Injection, Pharmacologic Agent - OS - Left Eye    bevacizumab (AVASTIN) SOSY 2.5 mg  2. Exudative age-related macular degeneration of right eye with active choroidal neovascularization (HCC)  H35.3211     1.  OS, subfoveal disciform scar previous active edges controlled now at 5-month interval.  We will repeat injection today  2.  Dilate OD next early February 2022, at 8-week follow-up interval.  Likely injection for recent activity  3.  Ophthalmic Meds Ordered this visit:  Meds ordered this encounter  Medications  . bevacizumab (AVASTIN) SOSY 2.5 mg       Return in about 4 months (around 06/11/2020) for DILATE OU, COLOR FP, OCT, OS, no planned injection OS next.  There are no Patient Instructions on file for this visit.   Explained the diagnoses, plan, and follow up with the patient and they expressed understanding.  Patient expressed understanding of the importance of proper follow up care.   Clent Demark Renai Lopata M.D. Diseases & Surgery of the Retina and Vitreous Retina & Diabetic [place] 02/12/20     Abbreviations: M myopia (nearsighted); A astigmatism; H hyperopia (farsighted); P presbyopia; Mrx spectacle prescription;  CTL contact lenses; OD right eye; OS left eye; OU both eyes  XT exotropia; ET esotropia; PEK punctate epithelial keratitis; PEE punctate epithelial erosions; DES dry eye syndrome; MGD meibomian gland dysfunction; ATs artificial tears; PFAT's preservative free artificial tears; [place] nuclear sclerotic cataract; PSC posterior subcapsular cataract; ERM epi-retinal membrane; PVD posterior vitreous detachment; RD retinal detachment; DM  diabetes mellitus; DR diabetic retinopathy; NPDR non-proliferative diabetic retinopathy; PDR proliferative diabetic retinopathy; CSME clinically significant macular edema; DME diabetic macular edema; dbh dot blot hemorrhages; CWS cotton wool spot; POAG primary open angle glaucoma; C/D cup-to-disc ratio; HVF humphrey visual field; GVF goldmann visual field; OCT optical coherence tomography; IOP intraocular pressure; BRVO Branch retinal vein occlusion; CRVO central retinal vein occlusion; CRAO central retinal artery occlusion; BRAO branch retinal artery occlusion; RT retinal tear; SB scleral buckle; PPV pars plana vitrectomy; VH Vitreous hemorrhage; PRP panretinal laser photocoagulation; IVK intravitreal kenalog; VMT vitreomacular traction; MH Macular hole;  NVD neovascularization of the disc; NVE neovascularization elsewhere; AREDS age related eye disease study; ARMD age related macular degeneration; POAG primary open angle glaucoma; EBMD epithelial/anterior basement membrane dystrophy; ACIOL anterior chamber intraocular lens; IOL intraocular lens; PCIOL posterior chamber intraocular lens; Phaco/IOL phacoemulsification with intraocular lens placement; [place] photorefractive keratectomy; LASIK laser assisted in situ keratomileusis; HTN hypertension; DM diabetes mellitus; COPD chronic obstructive pulmonary disease

## 2020-02-27 ENCOUNTER — Other Ambulatory Visit: Payer: Self-pay | Admitting: *Deleted

## 2020-02-27 DIAGNOSIS — M545 Low back pain, unspecified: Secondary | ICD-10-CM

## 2020-02-27 MED ORDER — FENTANYL 12 MCG/HR TD PT72: MEDICATED_PATCH | TRANSDERMAL | 0 refills | Status: DC

## 2020-02-27 NOTE — Telephone Encounter (Signed)
Patient requested refill Epic LR: 01/28/2020 Pended Rx and sent to Dr. Mariea Clonts for approval.

## 2020-03-03 ENCOUNTER — Other Ambulatory Visit: Payer: Self-pay | Admitting: Nurse Practitioner

## 2020-03-03 DIAGNOSIS — E2839 Other primary ovarian failure: Secondary | ICD-10-CM

## 2020-03-05 ENCOUNTER — Non-Acute Institutional Stay: Payer: Medicare Other | Admitting: Internal Medicine

## 2020-03-05 ENCOUNTER — Other Ambulatory Visit: Payer: Self-pay

## 2020-03-05 ENCOUNTER — Encounter: Payer: Self-pay | Admitting: Internal Medicine

## 2020-03-05 VITALS — BP 118/62 | HR 73 | Temp 97.7°F | Ht 61.0 in | Wt 103.0 lb

## 2020-03-05 DIAGNOSIS — G44211 Episodic tension-type headache, intractable: Secondary | ICD-10-CM | POA: Diagnosis not present

## 2020-03-05 NOTE — Progress Notes (Signed)
Location:  [place] of Service:  Clinic (12)  Provider: [place] Sara L. Mariea Clonts, D.O., C.M.D.  Code Status: DNR Goals of Care:  Advanced Directives 03/05/2020  Does Patient Have a Medical Advance Directive? Yes  Type of Advance Directive Out of facility DNR (pink MOST or yellow form)  Does patient want to make changes to medical advance directive? No - Patient declined  Copy of [place] in Chart? -  Would patient like information on creating a medical advance directive? -  Pre-existing out of facility DNR order (yellow form or pink MOST form) Pink MOST/Yellow Form most recent copy in chart - Physician notified to receive inpatient order     Chief Complaint  Patient presents with  . Acute Visit    Headache x 1 week    HPI: Patient is a 87 y.o. female seen today for an acute visit for headache that moves for over a week.  Right now a band over her eyes and temples.  Sometimes it's on top of her head.  No sinus congestion.  No neck aches.  No nausea.  She does not think she's stressed.  She's not taken anything regularly for the headache.  Rarely will take a tylenol.  On fentanyl and tramadol at hs which does help her back.    Left eye chronically blurred.  No marked differences with vision.     The minute she starts to read anything, she gets sleepy and falls asleep.  If she sits down to do anything, she's out.  TSH has not been done since 9/20.    Bowels are not different.  Same old.    Past Medical History:  Diagnosis Date  . Abnormal stress test    a. 11/2011 Ex MV: EF 80%, small, partially reversible anteroapical defect->mild ischemia vs attenuation-->med Rx.  [name] Kitchen Alopecia, unspecified   . Arthritis   . Chronic diastolic CHF (congestive heart failure) ([place])    a. 11/2013 Echo: EF 60-65%, no rwma, mild TR, PASP 59mmHg.  . Closed fracture of lower end of radius with ulna   . Deafness    Left, s/p multiple surgeries  . Essential hypertension   .  Gouty arthropathy, unspecified   . History of kidney stones   . Hypothyroidism   . Insomnia, unspecified   . Macular degeneration    Left, s/p inj. tx  . Mitral valve disorders(424.0)   . Neck mass    right, w/u including MRI negative  . PAF (paroxysmal atrial fibrillation) (HCC)    a. on amio (02/2012 mild obstruction on PFT's) and xarelto.  . Pneumonia   . Polycythemia vera(238.4)   . Pure hypercholesterolemia   . Senile osteoporosis    Reclast in the past  . Tricuspid valve disorders, specified as nonrheumatic     Past Surgical History:  Procedure Laterality Date  . BREAST BIOPSY  06/18/1998   left  . CARDIOVERSION N/A 07/15/2017   Procedure: CARDIOVERSION;  Surgeon: Larey Dresser, MD;  Location: [facility] ENDOSCOPY;  Service: Cardiovascular;  Laterality: N/A;  . CARDIOVERSION N/A 07/21/2018   Procedure: CARDIOVERSION;  Surgeon: Larey Dresser, MD;  Location: [facility] ENDOSCOPY;  Service: Cardiovascular;  Laterality: N/A;  . CARDIOVERSION N/A 10/06/2018   Procedure: CARDIOVERSION;  Surgeon: Larey Dresser, MD;  Location: [facility] ENDOSCOPY;  Service: Cardiovascular;  Laterality: N/A;  . ELECTROPHYSIOLOGIC STUDY N/A 01/08/2016   Procedure: Atrial Fibrillation Ablation;  Surgeon: Thompson Grayer, MD;  Location: [place] CV LAB;  Service:  Cardiovascular;  Laterality: N/A;  . FEMUR IM NAIL Right 10/08/2015   Procedure: INTRAMEDULLARY (IM) NAIL FEMORAL RIGHT;  Surgeon: Paralee Cancel, MD;  Location: [facility] ORS;  Service: Orthopedics;  Laterality: Right;  . MASS EXCISION Right 11/26/2016   Procedure: EXCISION RIGHT LATERAL NECK MASS;  Surgeon: Jerrell Belfast, MD;  Location: [place];  Service: ENT;  Laterality: Right;  . TEE WITHOUT CARDIOVERSION N/A 07/15/2017   Procedure: TRANSESOPHAGEAL ECHOCARDIOGRAM (TEE);  Surgeon: Larey Dresser, MD;  Location: [facility] ENDOSCOPY;  Service: Cardiovascular;  Laterality: N/A;  . TONSILLECTOMY    . TOTAL ABDOMINAL HYSTERECTOMY    . TYMPANOPLASTY Bilateral     Allergies   Allergen Reactions  . Akten [Lidocaine]   . Amlodipine Swelling and Other (See Comments)    Lower extremity swelling   . Augmentin [Amoxicillin-Pot Clavulanate] Other (See Comments)    Stomach Pain/cramps Did it involve swelling of the face/tongue/throat, SOB, or low BP? No Did it involve sudden or severe rash/hives, skin peeling, or any reaction on the inside of your mouth or nose? No Did you need to seek medical attention at a hospital or doctor's office? Unknown When did it last happen?unknown If all above answers are "NO", may proceed with cephalosporin use.     Outpatient Encounter Medications as of 03/05/2020  Medication Sig  . acetaminophen (TYLENOL) 325 MG tablet Take 650 mg by mouth every 4 (four) hours as needed.  [name] Kitchen ELIQUIS 2.5 MG TABS tablet TAKE 1 TABLET BY MOUTH TWICE DAILY.  . fentaNYL (DURAGESIC) 12 MCG/HR Place one patch onto the skin every 3 days. Remove old patch  . gabapentin (NEURONTIN) 100 MG capsule Take 1 capsule (100 mg total) by mouth daily.  . hydroxyurea (HYDREA) 500 MG capsule 2 capsules daily from Mondays to Fridays and 1 capsule daily on Saturdays and Sunday  . levothyroxine (SYNTHROID) 100 MCG tablet TAKE 1 TABLET ONCE DAILY BEFORE BREAKFAST.  . Magnesium 250 MG TABS Take 250 mg by mouth daily.   . Melatonin 10 MG TABS Take 10 mg by mouth at bedtime.  . metoprolol succinate (TOPROL-XL) 25 MG 24 hr tablet Take 1 tablet (25 mg total) by mouth 2 (two) times daily. (Patient taking differently: Take 12.5 mg by mouth 2 (two) times daily.)  . Multiple Vitamins-Minerals (PRESERVISION AREDS 2) CAPS Take 1 capsule by mouth 2 (two) times daily.   . potassium chloride SA (KLOR-CON) 20 MEQ tablet Take 1 tablet (20 mEq total) by mouth once a week. Every Wednesday when you take Metolazone  . sennosides-docusate sodium (SENOKOT-S) 8.6-50 MG tablet Take 2 tablets by mouth 2 (two) times daily.  . sodium chloride (OCEAN) 0.65 % SOLN nasal spray Place 2 sprays into both  nostrils 2 (two) times daily.  [name] Kitchen torsemide (DEMADEX) 20 MG tablet Take 4 tablets (80 mg total) by mouth daily.  . traMADol (ULTRAM) 50 MG tablet TAKE ONE TABLET AT BEDTIME.   No facility-administered encounter medications on file as of 03/05/2020.    Review of Systems:  Review of Systems  Constitutional: Negative for chills and fever.  HENT: Positive for hearing loss and sinus pain. Negative for congestion, ear pain, sore throat and tinnitus.   Eyes: Positive for blurred vision.  Respiratory: Negative for cough and shortness of breath.   Cardiovascular: Negative for chest pain and leg swelling.  Gastrointestinal: Positive for constipation. Negative for abdominal pain.  Genitourinary: Negative for dysuria.  Musculoskeletal: Negative for falls and joint pain.       Uses rolling  walker  Skin: Negative for rash.  Neurological: Positive for headaches. Negative for dizziness, sensory change, speech change, focal weakness and loss of consciousness.  Endo/Heme/Allergies: Bruises/bleeds easily.    Health Maintenance  Topic Date Due  . COVID-19 Vaccine (3 - Moderna risk 4-dose series) 04/17/2019  . TETANUS/TDAP  01/13/2027  . INFLUENZA VACCINE  Completed  . DEXA SCAN  Completed  . PNA vac Low Risk Adult  Completed    Physical Exam: Vitals:   03/05/20 1542  BP: 118/62  Pulse: 73  Temp: 97.7 F (36.5 C)  SpO2: 97%  Weight: 103 lb (46.7 kg)  Height: 5\' 1"  (1.549 m)   Body mass index is 19.46 kg/m. Physical Exam Vitals reviewed.  Constitutional:      General: She is not in acute distress.    Appearance: Normal appearance. She is not ill-appearing.  HENT:     Head: Normocephalic and atraumatic.     Right Ear: Tympanic membrane, ear canal and external ear normal.     Left Ear: Tympanic membrane, ear canal and external ear normal.     Ears:     Comments: Hearing aids, still HOH    Nose: Nose normal. No congestion.     Comments: Not tender over sinuses    Mouth/Throat:      Pharynx: Oropharynx is clear.  Eyes:     Conjunctiva/sclera: Conjunctivae normal.     Pupils: Pupils are equal, round, and reactive to light.  Cardiovascular:     Rate and Rhythm: Rhythm irregular.     Heart sounds: No murmur heard.   Pulmonary:     Effort: Pulmonary effort is normal.     Breath sounds: Normal breath sounds. No wheezing, rhonchi or rales.  Abdominal:     General: Bowel sounds are normal.  Musculoskeletal:        General: Normal range of motion.     Right lower leg: No edema.     Left lower leg: No edema.  Neurological:     General: No focal deficit present.     Mental Status: She is alert and oriented to person, place, and time.     Cranial Nerves: No cranial nerve deficit.     Motor: No weakness.     Comments: Uses rolling walker  Psychiatric:        Mood and Affect: Mood normal.     Labs reviewed: Basic Metabolic Panel: Recent Labs    01/01/20 1456 01/15/20 1102 01/17/20 1234  NA 141 138 141  K 3.5 4.0 3.8  CL 101 94* 98  CO2 31 35* 32  GLUCOSE 98 96 92  BUN 42* 78* 63*  CREATININE 1.24* 1.28* 1.27*  CALCIUM 10.3 10.7* 11.2*   Liver Function Tests: Recent Labs    04/09/19 0129 06/19/19 1435  AST 20 17  ALT 16 19  ALKPHOS 86 48  BILITOT 1.1 1.1  PROT 7.2 6.6  ALBUMIN 4.9 4.2   No results for input(s): LIPASE, AMYLASE in the last 8760 hours. No results for input(s): AMMONIA in the last 8760 hours. CBC: Recent Labs    09/25/19 1331 10/05/19 1243 12/27/19 1137 01/10/20 1142 01/17/20 1234  WBC 7.6   < > 6.5 6.1 9.3  NEUTROABS 6.0  --  4,693 4.2  --   HGB 12.4   < > 10.8* 11.4* 11.2*  HCT 37.8   < > 31.5* 34.6* 34.9*  MCV 116.7*   < > 113.3* 116.5* 120.3*  PLT 372   < >  395 393 406*   < > = values in this interval not displayed.   Lipid Panel: No results for input(s): CHOL, HDL, LDLCALC, TRIG, CHOLHDL, LDLDIRECT in the last 8760 hours. No results found for: HGBA1C  Procedures since last visit: Intravitreal Injection,  Pharmacologic Agent - OS - Left Eye  Result Date: 02/12/2020 Time Out 02/12/2020. 3:02 PM. Confirmed correct patient, procedure, site, and patient consented. Anesthesia Topical anesthesia was used. Anesthetic medications included Lidocaine 4%. Procedure Preparation included 5% betadine to ocular surface, 10% betadine to eyelids. A 30 gauge needle was used. Injection: 2.5 mg Bevacizumab (AVASTIN) 2.5mg /0.19mL SOSY   NDC: [id number], [id number]   Route: Intravitreal, Site: Left Eye Post-op Post injection exam found visual acuity of at least counting fingers. The patient tolerated the procedure well. There were no complications. The patient received written and verbal post procedure care education. Post injection medications were not given.   OCT, Retina - OU - Both Eyes  Result Date: 02/12/2020 Right Eye Quality was good. Scan locations included subfoveal. Central Foveal Thickness: 224. Progression has been stable. Findings include central retinal atrophy, outer retinal atrophy. Left Eye Quality was good. Scan locations included subfoveal. Central Foveal Thickness: 411. Progression has been stable. Findings include disciform scar, central retinal atrophy, outer retinal atrophy, subretinal scarring.    Assessment/Plan 1. Intractable episodic tension-type headache -cause not clear, sometimes over frontal sinus area, sometimes pressure on top of head -seems like tension HA -has not consistently taken tylenol for it, just lives with it -last blood counts with mild anemia and platelets 406 early dec -has macular degeneration progressing -bp excellent and not dizzy -she's had periods of these before -advised to consistently use tylenol and call me back if still having HA frequently--has not had CT brain so may need to do this -also might consider recheck of cbc with diff  Labs/tests ordered:  None today Next appt:  03/25/2020  Katharin Schneider L. Arkeem Harts, D.O. [facility] [address], [place] [address] Cell Phone (Mon-Fri 8am-5pm):  [phone] On Call:  [phone] & follow prompts after 5pm & weekends Office Phone:  [phone] Office Fax:  [phone]

## 2020-03-06 DIAGNOSIS — E039 Hypothyroidism, unspecified: Secondary | ICD-10-CM | POA: Diagnosis not present

## 2020-03-06 LAB — TSH: TSH: 0.65 (ref 0.41–5.90)

## 2020-03-11 ENCOUNTER — Telehealth: Payer: Self-pay

## 2020-03-11 DIAGNOSIS — R519 Headache, unspecified: Secondary | ICD-10-CM

## 2020-03-11 NOTE — Telephone Encounter (Signed)
Incoming call received from patient stating Dr.Reed told her to call if headaches do not improve and they have not improved.  Last office visit: 03/05/2020  Please advise

## 2020-03-11 NOTE — Telephone Encounter (Signed)
Because she's continuing to get frequent headaches, she needs imaging of her brain--I have placed an order for CT brain at [facility] [address].

## 2020-03-11 NOTE — Telephone Encounter (Signed)
Patient is aware with Dr.Reeds course of action and will await call from [place] to schedule CT

## 2020-03-12 ENCOUNTER — Other Ambulatory Visit: Payer: Self-pay | Admitting: Internal Medicine

## 2020-03-12 NOTE — Telephone Encounter (Signed)
Patient has request refill on medication "Levothyroxine 100 mcg". Patient last refill was 02/11/2020 with 30 tablets to be taken once daily before breakfast. Patient last TSH was 10/13/2018. Medication pend and sent to PCP Mariea Clonts, Tiffany L, DO . Please Advise.

## 2020-03-18 ENCOUNTER — Other Ambulatory Visit: Payer: Self-pay

## 2020-03-18 ENCOUNTER — Ambulatory Visit (INDEPENDENT_AMBULATORY_CARE_PROVIDER_SITE_OTHER): Payer: Medicare Other | Admitting: Ophthalmology

## 2020-03-18 ENCOUNTER — Encounter (INDEPENDENT_AMBULATORY_CARE_PROVIDER_SITE_OTHER): Payer: Medicare Other | Admitting: Ophthalmology

## 2020-03-18 ENCOUNTER — Encounter (INDEPENDENT_AMBULATORY_CARE_PROVIDER_SITE_OTHER): Payer: Self-pay | Admitting: Ophthalmology

## 2020-03-18 DIAGNOSIS — H353221 Exudative age-related macular degeneration, left eye, with active choroidal neovascularization: Secondary | ICD-10-CM

## 2020-03-18 DIAGNOSIS — H353211 Exudative age-related macular degeneration, right eye, with active choroidal neovascularization: Secondary | ICD-10-CM | POA: Diagnosis not present

## 2020-03-18 DIAGNOSIS — H353113 Nonexudative age-related macular degeneration, right eye, advanced atrophic without subfoveal involvement: Secondary | ICD-10-CM | POA: Diagnosis not present

## 2020-03-18 MED ORDER — AFLIBERCEPT 2MG/0.05ML IZ SOLN FOR KALEIDOSCOPE
2.0000 mg | INTRAVITREAL | Status: AC | PRN
  Administered 2020-03-18: 2 mg via INTRAVITREAL

## 2020-03-18 NOTE — Assessment & Plan Note (Signed)
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

## 2020-03-18 NOTE — Assessment & Plan Note (Signed)
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  Repeat intravitreal Eylea OD today and examination again right IN 3 months

## 2020-03-18 NOTE — Progress Notes (Signed)
03/18/2020     CHIEF COMPLAINT Patient presents for Retina Follow Up (10 Week F/U OD, poss Eylea OD//Pt denies noticeable changes to [place] OU since last visit. Pt denies ocular pain, flashes of light, or floaters OU. /////)   HISTORY OF PRESENT ILLNESS: Sarah Mays is a 87 y.o. female who presents to the clinic today for:   HPI    Retina Follow Up    Patient presents with  Wet AMD.  In right eye.  This started 10 weeks ago.  Severity is mild.  Duration of 10 weeks.  Since onset it is stable. Additional comments: 10 Week F/U OD, poss Eylea OD  Pt denies noticeable changes to [place] OU since last visit. Pt denies ocular pain, flashes of light, or floaters OU.             Last edited by Rockie Neighbours, [place] on 03/18/2020 11:37 AM. (History)      Referring physician: Gayland Curry, DO [place],  [place] [id number]  HISTORICAL INFORMATION:   Selected notes from the [employer]: No current outpatient medications on file. (Ophthalmic Drugs)   No current facility-administered medications for this visit. (Ophthalmic Drugs)   Current Outpatient Medications (Other)  Medication Sig  . levothyroxine (SYNTHROID) 100 MCG tablet TAKE 1 TABLET ONCE DAILY BEFORE BREAKFAST.  [name] Kitchen acetaminophen (TYLENOL) 325 MG tablet Take 650 mg by mouth every 4 (four) hours as needed.  [name] Kitchen ELIQUIS 2.5 MG TABS tablet TAKE 1 TABLET BY MOUTH TWICE DAILY.  . fentaNYL (DURAGESIC) 12 MCG/HR Place one patch onto the skin every 3 days. Remove old patch  . gabapentin (NEURONTIN) 100 MG capsule Take 1 capsule (100 mg total) by mouth daily.  . hydroxyurea (HYDREA) 500 MG capsule 2 capsules daily from Mondays to Fridays and 1 capsule daily on Saturdays and Sunday  . Magnesium 250 MG TABS Take 250 mg by mouth daily.   . Melatonin 10 MG TABS Take 10 mg by mouth at bedtime.  . metoprolol succinate (TOPROL-XL) 25 MG 24 hr tablet Take 1 tablet (25 mg total) by mouth 2 (two) times  daily. (Patient taking differently: Take 12.5 mg by mouth 2 (two) times daily.)  . Multiple Vitamins-Minerals (PRESERVISION AREDS 2) CAPS Take 1 capsule by mouth 2 (two) times daily.   . potassium chloride SA (KLOR-CON) 20 MEQ tablet Take 1 tablet (20 mEq total) by mouth once a week. Every Wednesday when you take Metolazone  . sennosides-docusate sodium (SENOKOT-S) 8.6-50 MG tablet Take 2 tablets by mouth 2 (two) times daily.  . sodium chloride (OCEAN) 0.65 % SOLN nasal spray Place 2 sprays into both nostrils 2 (two) times daily.  [name] Kitchen torsemide (DEMADEX) 20 MG tablet Take 4 tablets (80 mg total) by mouth daily.  . traMADol (ULTRAM) 50 MG tablet TAKE ONE TABLET AT BEDTIME.   No current facility-administered medications for this visit. (Other)      REVIEW OF SYSTEMS:    ALLERGIES Allergies  Allergen Reactions  . Akten [Lidocaine]   . Amlodipine Swelling and Other (See Comments)    Lower extremity swelling   . Augmentin [Amoxicillin-Pot Clavulanate] Other (See Comments)    Stomach Pain/cramps Did it involve swelling of the face/tongue/throat, SOB, or low BP? No Did it involve sudden or severe rash/hives, skin peeling, or any reaction on the inside of your mouth or nose? No Did you need to seek medical attention at a hospital  or doctor's office? Unknown When did it last happen?unknown If all above answers are "NO", may proceed with cephalosporin use.     PAST MEDICAL HISTORY Past Medical History:  Diagnosis Date  . Abnormal stress test    a. 11/2011 Ex MV: EF 80%, small, partially reversible anteroapical defect->mild ischemia vs attenuation-->med Rx.  [name] Kitchen Alopecia, unspecified   . Arthritis   . Chronic diastolic CHF (congestive heart failure) ([place])    a. 11/2013 Echo: EF 60-65%, no rwma, mild TR, PASP 42mmHg.  . Closed fracture of lower end of radius with ulna   . Deafness    Left, s/p multiple surgeries  . Essential hypertension   . Gouty arthropathy, unspecified   .  History of kidney stones   . Hypothyroidism   . Insomnia, unspecified   . Macular degeneration    Left, s/p inj. tx  . Mitral valve disorders(424.0)   . Neck mass    right, w/u including MRI negative  . PAF (paroxysmal atrial fibrillation) (HCC)    a. on amio (02/2012 mild obstruction on PFT's) and xarelto.  . Pneumonia   . Polycythemia vera(238.4)   . Pure hypercholesterolemia   . Senile osteoporosis    Reclast in the past  . Tricuspid valve disorders, specified as nonrheumatic    Past Surgical History:  Procedure Laterality Date  . BREAST BIOPSY  06/18/1998   left  . CARDIOVERSION N/A 07/15/2017   Procedure: CARDIOVERSION;  Surgeon: Larey Dresser, MD;  Location: [facility] ENDOSCOPY;  Service: Cardiovascular;  Laterality: N/A;  . CARDIOVERSION N/A 07/21/2018   Procedure: CARDIOVERSION;  Surgeon: Larey Dresser, MD;  Location: [facility] ENDOSCOPY;  Service: Cardiovascular;  Laterality: N/A;  . CARDIOVERSION N/A 10/06/2018   Procedure: CARDIOVERSION;  Surgeon: Larey Dresser, MD;  Location: [facility] ENDOSCOPY;  Service: Cardiovascular;  Laterality: N/A;  . ELECTROPHYSIOLOGIC STUDY N/A 01/08/2016   Procedure: Atrial Fibrillation Ablation;  Surgeon: Thompson Grayer, MD;  Location: [place] CV LAB;  Service: Cardiovascular;  Laterality: N/A;  . FEMUR IM NAIL Right 10/08/2015   Procedure: INTRAMEDULLARY (IM) NAIL FEMORAL RIGHT;  Surgeon: Paralee Cancel, MD;  Location: [facility] ORS;  Service: Orthopedics;  Laterality: Right;  . MASS EXCISION Right 11/26/2016   Procedure: EXCISION RIGHT LATERAL NECK MASS;  Surgeon: Jerrell Belfast, MD;  Location: [facility];  Service: ENT;  Laterality: Right;  . TEE WITHOUT CARDIOVERSION N/A 07/15/2017   Procedure: TRANSESOPHAGEAL ECHOCARDIOGRAM (TEE);  Surgeon: Larey Dresser, MD;  Location: [facility] ENDOSCOPY;  Service: Cardiovascular;  Laterality: N/A;  . TONSILLECTOMY    . TOTAL ABDOMINAL HYSTERECTOMY    . TYMPANOPLASTY Bilateral     FAMILY HISTORY Family History  Problem Relation  Age of Onset  . Hypertension Father   . Heart disease Father        patient does not know details.   . Ovarian cancer Mother 76  . Cancer Sister        colon  . Heart disease Sister   . Cancer Sister   . Cancer Sister        hodgkin disease  . Breast cancer Daughter   . Cancer Daughter        breast    SOCIAL HISTORY Social History   Tobacco Use  . Smoking status: Former Smoker    Types: Cigarettes    Quit date: 01/19/1984    Years since quitting: 36.1  . Smokeless tobacco: Never Used  Vaping Use  . Vaping Use: Never used  Substance Use Topics  .  Alcohol use: Yes    Alcohol/week: 1.0 standard drink    Types: 1 Glasses of wine per week    Comment: Every evening   . Drug use: No         OPHTHALMIC EXAM:  Base Eye Exam    Visual Acuity (ETDRS)      Right Left   Dist cc 20/40 -2 20/400ecc   Dist ph cc NI NI   Correction: Glasses       Tonometry (Tonopen, 11:37 AM)      Right Left   Pressure 13 13       Pupils      Dark Light Shape React APD   Right 4 3 Round Brisk None   Left 4 4 Irregular Minimal None       Visual Fields (Counting fingers)      Left Right    Full    Restrictions  Partial outer inferior temporal, inferior nasal deficiencies       Extraocular Movement      Right Left    Full Full       Neuro/Psych    Oriented x3: Yes   Mood/Affect: Normal       Dilation    Right eye: 1.0% Mydriacyl, 2.5% Phenylephrine @ 11:41 AM        Slit Lamp and Fundus Exam    External Exam      Right Left   External Normal        Slit Lamp Exam      Right Left   Lids/Lashes Normal    Conjunctiva/Sclera White and quiet    Cornea Clear    Anterior Chamber Deep and quiet    Iris Round and reactive    Lens Posterior chamber intraocular lens     Anterior Vitreous Normal        Fundus Exam      Right Left   Posterior Vitreous Posterior vitreous detachment    Disc Normal    C/D Ratio 0.45    Macula Atrophy, Age related macular  degeneration, Advanced age related macular degeneration, [place], Geographic atrophy nearing the FAZ, no macular thickening, no hemorrhage    Vessels Normal    Periphery Normal           IMAGING AND PROCEDURES  Imaging and Procedures for 03/18/20  OCT, Retina - OU - Both Eyes       Right Eye Quality was good. Scan locations included subfoveal. Central Foveal Thickness: 207. Progression has been stable. Findings include central retinal atrophy, outer retinal atrophy.   Left Eye Quality was good. Scan locations included subfoveal. Central Foveal Thickness: 411. Progression has been stable. Findings include disciform scar, central retinal atrophy, outer retinal atrophy, subretinal scarring.   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  Repeat injection today and examination OD in 3 months  OS currently 5 weeks post most recent evaluation and injection antivegF.       Intravitreal Injection, Pharmacologic Agent - OD - Right Eye       Time Out 03/18/2020. 12:03 PM. Confirmed correct patient, procedure, site, and patient consented.   Anesthesia Topical anesthesia was used. Anesthetic medications included Lidocaine 4%.   Procedure Preparation included 10% betadine to eyelids, 5% betadine to ocular surface, Tobramycin 0.3%. A 30 gauge needle was used.   Injection:  2 mg aflibercept Alfonse Flavors) SOLN   NDC: 09326-712-45, Lot: [id number]   Route: Intravitreal, Site: Right Eye,  Waste: 0 mg  Post-op Post injection exam found visual acuity of at least counting fingers. The patient tolerated the procedure well. There were no complications. The patient received written and verbal post procedure care education.   Notes Lidocaine topically applied inferotemporal with sterile Q-tip.                ASSESSMENT/PLAN:  Advanced nonexudative age-related macular degeneration of right eye without subfoveal involvement [place] foveal  geographic atrophy as a consequence of having large subfoveal pigment epithelial detachments in the past  Exudative age-related macular degeneration of right eye with active choroidal neovascularization (HCC) Much improved macular anatomy, no signs of CNVM recurrence now extending interval 10 weeks.  Repeat intravitreal Eylea OD today and examination again right IN 3 months  Exudative age-related macular degeneration of left eye with active choroidal neovascularization ([place]) Dilate follow-up left eye as scheduled      ICD-10-CM   1. Exudative age-related macular degeneration of right eye with active choroidal neovascularization (HCC)  H35.3211 OCT, Retina - OU - Both Eyes    Intravitreal Injection, Pharmacologic Agent - OD - Right Eye    aflibercept (EYLEA) SOLN 2 mg  2. Advanced nonexudative age-related macular degeneration of right eye without subfoveal involvement  H35.3113   3. Exudative age-related macular degeneration of left eye with active choroidal neovascularization ([place])  H35.3221     1.  Repeat intravitreal Eylea OD today and examination repeat in 3 months  Stable acuity with central atrophy  2.  Follow-up dilated examination left eye as scheduled  3.  Ophthalmic Meds Ordered this visit:  Meds ordered this encounter  Medications  . aflibercept (EYLEA) SOLN 2 mg       Return in about 3 months (around 06/15/2020) for dilate, OD, EYLEA OCT.  There are no Patient Instructions on file for this visit.   Explained the diagnoses, plan, and follow up with the patient and they expressed understanding.  Patient expressed understanding of the importance of proper follow up care.   Clent Demark Sinclaire Artiga M.D. Diseases & Surgery of the Retina and Vitreous [facility] 03/18/20     Abbreviations: M myopia (nearsighted); A astigmatism; H hyperopia (farsighted); P presbyopia; Mrx spectacle prescription;  CTL contact lenses; OD right eye; OS left eye; OU both eyes  XT  exotropia; ET esotropia; PEK punctate epithelial keratitis; PEE punctate epithelial erosions; DES dry eye syndrome; MGD meibomian gland dysfunction; ATs artificial tears; PFAT's preservative free artificial tears; [place] nuclear sclerotic cataract; PSC posterior subcapsular cataract; ERM epi-retinal membrane; PVD posterior vitreous detachment; RD retinal detachment; DM diabetes mellitus; DR diabetic retinopathy; NPDR non-proliferative diabetic retinopathy; PDR proliferative diabetic retinopathy; CSME clinically significant macular edema; DME diabetic macular edema; dbh dot blot hemorrhages; CWS cotton wool spot; POAG primary open angle glaucoma; C/D cup-to-disc ratio; HVF humphrey visual field; GVF goldmann visual field; OCT optical coherence tomography; IOP intraocular pressure; BRVO Branch retinal vein occlusion; CRVO central retinal vein occlusion; CRAO central retinal artery occlusion; BRAO branch retinal artery occlusion; RT retinal tear; SB scleral buckle; PPV pars plana vitrectomy; VH Vitreous hemorrhage; PRP panretinal laser photocoagulation; IVK intravitreal kenalog; VMT vitreomacular traction; MH Macular hole;  NVD neovascularization of the disc; NVE neovascularization elsewhere; AREDS age related eye disease study; ARMD age related macular degeneration; POAG primary open angle glaucoma; EBMD epithelial/anterior basement membrane dystrophy; ACIOL anterior chamber intraocular lens; IOL intraocular lens; PCIOL posterior chamber intraocular lens; Phaco/IOL phacoemulsification with intraocular lens placement; PRK photorefractive keratectomy; LASIK laser assisted in  situ keratomileusis; HTN hypertension; DM diabetes mellitus; COPD chronic obstructive pulmonary disease

## 2020-03-18 NOTE — Assessment & Plan Note (Signed)
Dilate follow-up left eye as scheduled

## 2020-03-25 ENCOUNTER — Ambulatory Visit: Payer: Medicare Other

## 2020-03-27 ENCOUNTER — Ambulatory Visit
Admission: RE | Admit: 2020-03-27 | Discharge: 2020-03-27 | Disposition: A | Discharge status: Home / Self Care | Payer: Medicare Other | Source: Ambulatory Visit | Attending: Internal Medicine | Admitting: Internal Medicine

## 2020-03-27 ENCOUNTER — Other Ambulatory Visit: Payer: Self-pay

## 2020-03-27 DIAGNOSIS — R519 Headache, unspecified: Secondary | ICD-10-CM

## 2020-03-28 ENCOUNTER — Ambulatory Visit (HOSPITAL_COMMUNITY)
Admission: RE | Admit: 2020-03-28 | Discharge: 2020-03-28 | Disposition: A | Discharge status: Home / Self Care | Payer: Medicare Other | Source: Ambulatory Visit | Attending: Cardiology | Admitting: Cardiology

## 2020-03-28 ENCOUNTER — Encounter (HOSPITAL_COMMUNITY): Payer: Self-pay | Admitting: Cardiology

## 2020-03-28 ENCOUNTER — Other Ambulatory Visit: Payer: Self-pay | Admitting: Internal Medicine

## 2020-03-28 VITALS — BP 128/84 | HR 71 | Wt 100.4 lb

## 2020-03-28 DIAGNOSIS — I110 Hypertensive heart disease with heart failure: Secondary | ICD-10-CM | POA: Insufficient documentation

## 2020-03-28 DIAGNOSIS — I081 Rheumatic disorders of both mitral and tricuspid valves: Secondary | ICD-10-CM | POA: Diagnosis not present

## 2020-03-28 DIAGNOSIS — Z8249 Family history of ischemic heart disease and other diseases of the circulatory system: Secondary | ICD-10-CM | POA: Diagnosis not present

## 2020-03-28 DIAGNOSIS — G44211 Episodic tension-type headache, intractable: Secondary | ICD-10-CM

## 2020-03-28 DIAGNOSIS — Z87891 Personal history of nicotine dependence: Secondary | ICD-10-CM | POA: Diagnosis not present

## 2020-03-28 DIAGNOSIS — I4821 Permanent atrial fibrillation: Secondary | ICD-10-CM | POA: Insufficient documentation

## 2020-03-28 DIAGNOSIS — Z79890 Hormone replacement therapy (postmenopausal): Secondary | ICD-10-CM | POA: Diagnosis not present

## 2020-03-28 DIAGNOSIS — Z7901 Long term (current) use of anticoagulants: Secondary | ICD-10-CM | POA: Insufficient documentation

## 2020-03-28 DIAGNOSIS — R001 Bradycardia, unspecified: Secondary | ICD-10-CM | POA: Diagnosis not present

## 2020-03-28 DIAGNOSIS — I5032 Chronic diastolic (congestive) heart failure: Secondary | ICD-10-CM | POA: Insufficient documentation

## 2020-03-28 DIAGNOSIS — I482 Chronic atrial fibrillation, unspecified: Secondary | ICD-10-CM

## 2020-03-28 DIAGNOSIS — Z79899 Other long term (current) drug therapy: Secondary | ICD-10-CM | POA: Diagnosis not present

## 2020-03-28 DIAGNOSIS — R519 Headache, unspecified: Secondary | ICD-10-CM

## 2020-03-28 DIAGNOSIS — Z79891 Long term (current) use of opiate analgesic: Secondary | ICD-10-CM | POA: Diagnosis not present

## 2020-03-28 NOTE — Progress Notes (Signed)
CT brain is normal.  No explanation for headaches.  They did not seem like migraines so those types of meds were not prescribed.  If these persist, she may need MRI and headache clinic referral.

## 2020-03-28 NOTE — Patient Instructions (Addendum)
Labs done today. We will contact you only if your labs are abnormal.  No medication changes were made. Please continue all current medications as prescribed.  Your physician recommends that you schedule a follow-up appointment in: 3 months with our [facility] here in office  If you have any questions or concerns before your next appointment please send [place] a message through [place] or call our office at [phone].    TO LEAVE A MESSAGE FOR THE NURSE SELECT OPTION 2, PLEASE LEAVE A MESSAGE INCLUDING: . YOUR NAME . DATE OF BIRTH . CALL BACK NUMBER . REASON FOR CALL**this is important as we prioritize the call backs  YOU WILL RECEIVE A CALL BACK THE SAME DAY AS LONG AS YOU CALL BEFORE 4:00 PM   Do the following things EVERYDAY: 1) Weigh yourself in the morning before breakfast. Write it down and keep it in a log. 2) Take your medicines as prescribed 3) Eat low salt foods--Limit salt (sodium) to 2000 mg per day.  4) Stay as active as you can everyday 5) Limit all fluids for the day to less than 2 liters   At the [facility], you and your health needs are our priority. As part of our continuing mission to provide you with exceptional heart care, we have created designated Provider Care Teams. These Care Teams include your primary Cardiologist (physician) and Advanced Practice Providers (APPs- Physician Assistants and Nurse Practitioners) who all work together to provide you with the care you need, when you need it.   You may see any of the following providers on your designated Care Team at your next follow up: [name] Kitchen Dr Glori Bickers . Dr Loralie Champagne . Darrick Grinder, NP . Lyda Jester, PA . Audry Riles, PharmD   Please be sure to bring in all your medications bottles to every appointment.

## 2020-03-29 NOTE — Progress Notes (Signed)
ID:  Sarah Mays, DOB May 05, 1930, MRN [id number]   Provider location: [facility] Type of Visit: Established patient  PCP:  Gayland Curry, DO  Cardiologist:  Loralie Champagne, MD   History of Present Illness: Sarah Mays is a 87 y.o. female who has a history of paroxysmal atrial fibrillation and chronic diastolic CHF.  In 7/16, she was admitted with chest pain.  Cardiolite showed no ischemia or infarction.  Echo in 6/17 showed EF 60-65%.  She developed problems elevated HR when in atrial fibrillation but bradycardia when in NSR.  She underwent atrial fibrillation ablation in 11/17.  She is tolerating Xarelto without melena or BRBPR.    Echo in 11/18 showed EF 60-65%, PASP 47 mmHg.   TEE-guided DCCV back to NSR in 6/19.  TEE showed EF 55-60% with moderate-severe MR.   She developed multiple episodes of epistaxis with Xarelto use.  We have transitioned her to apixaban 2.5 mg bid and she has not had epistaxis since then.   Echo in 6/20 showed EF 60-65%, normal RV, mild-moderate MR.   She had recurrent atrial fibrillation with DCCV to NSR in 6/20.  She then had atypical flutter with DCCV to NSR in 8/20. Symptomatically worse when in atrial fibrillation/flutter. At a prior visit, she was in junctional rhythm. Zio patch x 3 days in 9/20 showed 51% atrial flutter with controlled rate. She saw Dr. Rayann Heman and they decided against repeat ablation. She has been in atrial fibrillation and flutter chronically since then, amiodarone was stopped since it did not keep her NSR.  Echo in 11/21 showed EF 60-65%, mildly decreased RV systolic function with D-shaped interventricular septum, PASP 68, moderate MR, severe TR.   She returns for followup of CHF and atrial fibrillation. Weight is down 6 lbs.  No peripheral edema.  She tries to walk for exercise, uses walker. She is short of breath if she walks fast.  No orthopnea/PND.  No chest pain.  She continues to take once  weekly metolazone on Wednesdays with torsemide 80 mg daily.   Labs (7/16): K 4, creatinine 0.94, calcium 10.8, LFTs normal Labs (9/16): K 4, creatinine 0.9, LDL 69, TSH normal, HCT 42.3 Labs (5/17): K 4.6, creatinine 0.94, HCT 41.5, LFTs normal. Labs (6/17): K 5, creatinine 1.04, LDL 42, HDL 101, TSH normal Labs (12/17): K 3.7, creatinine 0.76, hgb 11.2 Labs (4/18): K 4.6, creatinine 0.91 Labs (10/18): K 4.5, creatinine 0.62 Labs (12/18): hgb 13.5 Labs (1/19): K 4.3, creatinine 0.6 Labs (4/19): hgb 13.5 Labs (7/19): K 4.5, creatinine 0.5, hgb 12.1 Labs (11/19): K 3.7, creatinine 0.95 Labs (12/19): K 3.6, creatinine 0.9, hgb 11.6 Labs (6/20): K 3.7, creatinine 1.01 => 1.17 Labs (9/20): K 4.1, creatinine 1.17 Labs (3/21): K 3.7, creatinine 1.27 Labs (5/21): K 3.8, creatinine 1.4, hgb 10.8, bnp 549 Labs (7/21): K 3.9, creatinine 1.16 Labs (11/21): K 4.4, creatinine 1.3, BNP 542 Labs (12/21): K 3.8, creatinine 1.27, hgb 11.2 Labs (1/22): TSH normal  PMH: 1. Atrial fibrillation/flutter: Diagnosed initially in 10/13.  Holter monitor in 11/13 showed atrial fibrillation with average rate 65.  - Atrial fibrillation ablation in 11/17.  - TEE-guided DCCV in 6/19.  - atrial fibrillation with DCCV to NSR in 6/20.  - atypical atrial flutter with DCCV to NSR in 8/20.  - Zio patch x 3 days (9/20): 51% atrial flutter - Now chronic 2. Chronic diastolic CHF: Echo (12/75) with EF 65-70%, mild MR, moderate biatrial enlargement, moderate  TR, PA systolic pressure 45 mmHg.   Echo (10/15): EF 60-65%.  Echo (7/16) with EF 60-65%, mild MR.  - Echo (6/17): EF 60-65%, PASP 41 mmHg.  - Echo (11/18): EF 60-65%, PASP 47 mmHg - TEE (6/19): EF 55-60%, mild LVH, mildly decreased RV systolic function, peak RV-RA gradient 40 mmHg, moderate-severe MR with bileaflet MVP, ERO 0.37 cm^2.  - Echo (6/20): EF 60-65%, normal RV, mild-moderate MR, severe biatrial enlargement.  - Echo (11/21): EF 60-65%, mildly decreased RV  systolic function with D-shaped interventricular septum, PASP 68, moderate MR, severe TR. 3. ETT-Sestamibi (11/13): Exercised to stage II, small partially reversible anteroapical perfusion defect suggesting mild ischemia versus attenuation, EF 80%.  Cardiolite (7/16) with EF 64%, no ischemia or infarction.  4. HTN: Lower extremity swelling with amlodipine.  5. Polycythemia vera: Has had phlebotomy only once.  6. Hypothyroidism 7. Macular degeneration 8. TAH 1980 9. Familial hypocalciuric hypercalcemia 10. PFTs (amiodarone use) in 1/14 showed a mild obstructive defect.  11. Degenerative disc disease 12. Sick sinus syndrome: Has not required PPM. Junctional rhythm has been noted in the past.  13. Mitral regurgitation: TEE (6/19) with moderate-severe MR with bileaflet MVP, ERO 0.37 cm^2.  - Echo (6/20) with mild-moderate MR.   Current Outpatient Medications  Medication Sig Dispense Refill  . acetaminophen (TYLENOL) 325 MG tablet Take 650 mg by mouth every 4 (four) hours as needed.    [name] Kitchen ELIQUIS 2.5 MG TABS tablet TAKE 1 TABLET BY MOUTH TWICE DAILY. 180 tablet 3  . fentaNYL (DURAGESIC) 12 MCG/HR Place one patch onto the skin every 3 days. Remove old patch 10 patch 0  . gabapentin (NEURONTIN) 100 MG capsule Take 1 capsule (100 mg total) by mouth daily. 90 capsule 3  . hydroxyurea (HYDREA) 500 MG capsule 2 capsules daily from Mondays to Fridays and 1 capsule daily on Saturdays and Sunday 100 capsule 3  . levothyroxine (SYNTHROID) 100 MCG tablet TAKE 1 TABLET ONCE DAILY BEFORE BREAKFAST. 30 tablet 5  . Magnesium 250 MG TABS Take 250 mg by mouth daily.     . Melatonin 10 MG TABS Take 10 mg by mouth at bedtime.    . metolazone (ZAROXOLYN) 2.5 MG tablet Take 2.5 mg by mouth once a week.    . metoprolol succinate (TOPROL-XL) 25 MG 24 hr tablet Take 12.5 mg by mouth in the morning and at bedtime.    . Multiple Vitamins-Minerals (PRESERVISION AREDS 2) CAPS Take 1 capsule by mouth 2 (two) times daily.      . potassium chloride SA (KLOR-CON) 20 MEQ tablet Take 1 tablet (20 mEq total) by mouth once a week. Every Wednesday when you take Metolazone 10 tablet 3  . sennosides-docusate sodium (SENOKOT-S) 8.6-50 MG tablet Take 2 tablets by mouth 2 (two) times daily.    . sodium chloride (OCEAN) 0.65 % SOLN nasal spray Place 2 sprays into both nostrils 2 (two) times daily.    [name] Kitchen torsemide (DEMADEX) 20 MG tablet Take 4 tablets (80 mg total) by mouth daily. 360 tablet 3  . traMADol (ULTRAM) 50 MG tablet TAKE ONE TABLET AT BEDTIME. 30 tablet 5   No current facility-administered medications for this encounter.    Allergies:   Akten [lidocaine], Amlodipine, and Augmentin [amoxicillin-pot clavulanate]   Social History:  The patient  reports that she quit smoking about 36 years ago. Her smoking use included cigarettes. She has never used smokeless tobacco. She reports current alcohol use of about 1.0 standard drink of alcohol per week.  She reports that she does not use drugs.   Family History:  The patient's family history includes Breast cancer in her daughter; Cancer in her daughter, sister, sister, and sister; Heart disease in her father and sister; Hypertension in her father; Ovarian cancer (age of onset: 71) in her mother.   ROS:  Please see the history of present illness.   All other systems are personally reviewed and negative.   Exam:   BP 128/84   Pulse 71   Wt 45.5 kg (100 lb 6.4 oz)   SpO2 94%   BMI 18.97 kg/m  General: NAD Neck: No JVD, no thyromegaly or thyroid nodule.  Lungs: Clear to auscultation bilaterally with normal respiratory effort. CV: Nondisplaced PMI.  Heart irregular S1/S2, no S3/S4, 2/6 HSM LLSB.  No peripheral edema.  No carotid bruit.  Normal pedal pulses.  Abdomen: Soft, nontender, no hepatosplenomegaly, no distention.  Skin: Intact without lesions or rashes.  Neurologic: Alert and oriented x 3.  Psych: Normal affect. Extremities: No clubbing or cyanosis.  HEENT: Normal.    Recent Labs: 06/19/2019: ALT 19 12/27/2019: Brain Natriuretic Peptide 542 01/17/2020: BUN 63; Creatinine, Ser 1.27; Hemoglobin 11.2; Platelets 406; Potassium 3.8; Sodium 141 03/06/2020: TSH 0.65  Personally reviewed   Wt Readings from Last 3 Encounters:  03/28/20 45.5 kg (100 lb 6.4 oz)  03/05/20 46.7 kg (103 lb)  01/17/20 48.4 kg (106 lb 12.8 oz)      ASSESSMENT AND PLAN:  1. Atrial fibrillation/flutter: First noted in 10/13.  Atrial fibrillation has triggered acute on chronic diastolic CHF in the past.  CHADSVASC score is 29 (age, female gender, HTN, CHF).  She is anticoagulated with apixaban 2.5 mg bid which is appropriate for her age/weight.  She had atrial fibrillation ablation in 11/17.  TEE-guided DCCV in 6/19.  Cardioversion in 6/20 for atrial fibrillation and again in 8/20 for atypical atrial flutter.  Zio patch in 9/20 showed 51% atrial flutter. She saw Dr. Rayann Heman who thought rate-control would probably be the best strategy, recommended against repeat ablation.  She is now off amiodarone and in chronic atrial fibrillation.  - Adequate rate control with Toprol XL 12.5 mg bid.  - Continue apixaban 2.5 mg bid.  CBC today.    2. Chronic diastolic CHF:  Echo in 37/04 shows EF 60-65%, mildly decreased RV systolic function with D-shaped interventricular septum, PASP 68, moderate MR, severe TR.   Symptomatically worse since she has been in permanent atrial fibrillation.  Has prominent RV dysfunction with severe TR.  NYHA class II-III symptoms, she is not volume overloaded on exam.  - Continue torsemide 80 mg daily with metolazone 2.5 on Wednesdays.  BMET today, if creatinine is significantly elevated, may stop metolazone.   - Wear compression stockings during the day.   3. HTN: BP controlled.    4. Bradycardia: Suspect she has a degree of sick sinus syndrome. Prior junctional rhythm.  No recent bradyarrhythmias.  - Follow rhythm closely on Toprol XL.   5. Mitral regurgitation: Moderate  last echo, possibly due to dilated MV annulus with chronic atrial fibrillation and LA dilation.  6. Tricuspid regurgitation: Severe.   Followup in 3 months with APP.   Signed, Loralie Champagne, MD  03/29/2020  [facility] [address] [facility] [place] [address] [phone] (office) [phone] (fax)

## 2020-03-31 ENCOUNTER — Encounter: Payer: Self-pay | Admitting: Internal Medicine

## 2020-03-31 ENCOUNTER — Other Ambulatory Visit: Payer: Self-pay

## 2020-03-31 ENCOUNTER — Ambulatory Visit (INDEPENDENT_AMBULATORY_CARE_PROVIDER_SITE_OTHER): Payer: Medicare Other | Admitting: *Deleted

## 2020-03-31 DIAGNOSIS — M810 Age-related osteoporosis without current pathological fracture: Secondary | ICD-10-CM | POA: Diagnosis not present

## 2020-03-31 MED ORDER — DENOSUMAB 60 MG/ML ~~LOC~~ SOSY
60.0000 mg | PREFILLED_SYRINGE | Freq: Once | SUBCUTANEOUS | Status: AC
  Administered 2020-03-31: 60 mg via SUBCUTANEOUS

## 2020-04-01 ENCOUNTER — Ambulatory Visit (INDEPENDENT_AMBULATORY_CARE_PROVIDER_SITE_OTHER): Payer: Medicare Other | Admitting: Family Medicine

## 2020-04-01 ENCOUNTER — Ambulatory Visit: Payer: Self-pay

## 2020-04-01 ENCOUNTER — Encounter: Payer: Self-pay | Admitting: Family Medicine

## 2020-04-01 VITALS — BP 110/76 | HR 68 | Ht 61.0 in | Wt 101.6 lb

## 2020-04-01 DIAGNOSIS — G8929 Other chronic pain: Secondary | ICD-10-CM | POA: Diagnosis not present

## 2020-04-01 DIAGNOSIS — M25561 Pain in right knee: Secondary | ICD-10-CM

## 2020-04-01 DIAGNOSIS — M1711 Unilateral primary osteoarthritis, right knee: Secondary | ICD-10-CM | POA: Diagnosis not present

## 2020-04-01 NOTE — Progress Notes (Signed)
   I, Peterson Lombard, LAT, ATC acting as a scribe for Lynne Leader, MD.  Sarah Mays is a 87 y.o. female who presents to [place] at [facility] today for f/u OA of R knee. Pt was last seen by Dr. Tamala Julian on 12/721 and was given a steroid injection in her R knee and advised on icing regimen, topical, NSAIDs, and to continue using her walker. Today, pt reports R knee has worsened over the last few weeks. Pt reports the prior steroid injection are helpful. Pt locates pain to medial aspect and deep into the knee joint.  Pertinent review of systems: No fevers or chills  Relevant historical information: Heart disease.     Exam:  Ht 5\' 1"  (1.549 m)   BMI 18.97 kg/m  General: Well Developed, well nourished, and in no acute distress.   MSK: Right knee normal-appearing Normal motion with crepitation.  Tender palpation medial joint line.    Lab and Radiology Results  Procedure: Real-time Ultrasound Guided Injection of right knee superior lateral patellar space Device: Philips Affiniti 50G Images permanently stored and available for review in PACS Verbal informed consent obtained.  Discussed risks and benefits of procedure. Warned about infection bleeding damage to structures skin hypopigmentation and fat atrophy among others. Patient expresses understanding and agreement Time-out conducted.   Noted no overlying erythema, induration, or other signs of local infection.   Skin prepped in a sterile fashion.   Local anesthesia: Topical Ethyl chloride.   With sterile technique and under real time ultrasound guidance:  40 mg of Kenalog and 2 mL of Marcaine injected into knee joint. Fluid seen entering the joint capsule.   Completed without difficulty   Pain immediately resolved suggesting accurate placement of the medication.   Advised to call if fevers/chills, erythema, induration, drainage, or persistent bleeding.   Images permanently stored and available for review in the  ultrasound unit.  Impression: Technically successful ultrasound guided injection.          Assessment and Plan: 87 y.o. female with knee pain due to DJD exacerbation.  Plan for steroid injection.  Patient is having repeated steroid injections due to DJD.  Today's steroid injection is a little under 3 months since last injection which is a little too soon.  We will work on authorizing hyaluronic acid so that if her pain returns sooner than 3 months we can be prepared for an alternative.     Discussed warning signs or symptoms. Please see discharge instructions. Patient expresses understanding.   The above documentation has been reviewed and is accurate and complete Lynne Leader, M.D.

## 2020-04-01 NOTE — Patient Instructions (Signed)
Thank you for coming in today.  Call or go to the ER if you develop a large red swollen joint with extreme pain or oozing puss.    We will work on the gel shots.   Recheck with Dr Tamala Julian or Dr Georgina Snell for the knee in the future if needed.

## 2020-04-01 NOTE — Progress Notes (Deleted)
   I, Peterson Lombard, LAT, ATC acting as a scribe for Lynne Leader, MD.  Sarah Mays is a 87 y.o. female who presents to [place] at [facility] today for f/u OA of R knee. Pt was last seen by Dr. Tamala Julian on 01/15/20 and was given a steroid injection in her R knee and advised on icing, HEP, and topicals. Today, pt reports   Pertinent review of systems: ***  Relevant historical information: ***   Exam:  There were no vitals taken for this visit. General: Well Developed, well nourished, and in no acute distress.   MSK: ***    Lab and Radiology Results No results found. However, due to the size of the patient record, not all encounters were searched. Please check Results Review for a complete set of results. No results found.     Assessment and Plan: 87 y.o. female with ***   PDMP not reviewed this encounter. No orders of the defined types were placed in this encounter.  No orders of the defined types were placed in this encounter.    Discussed warning signs or symptoms. Please see discharge instructions. Patient expresses understanding.   ***

## 2020-04-02 ENCOUNTER — Ambulatory Visit: Payer: Medicare Other | Admitting: Family Medicine

## 2020-04-07 ENCOUNTER — Other Ambulatory Visit: Payer: Self-pay | Admitting: *Deleted

## 2020-04-07 DIAGNOSIS — M545 Low back pain, unspecified: Secondary | ICD-10-CM

## 2020-04-07 MED ORDER — FENTANYL 12 MCG/HR TD PT72: MEDICATED_PATCH | TRANSDERMAL | 0 refills | Status: AC

## 2020-04-07 NOTE — Telephone Encounter (Signed)
Patient requested refill Epic LR: 02/27/2020 Contract needs updated, added note to upcoming appointment.  Pended Rx and sent to Dr. Mariea Clonts for approval.

## 2020-04-10 ENCOUNTER — Inpatient Hospital Stay: Payer: Medicare Other

## 2020-04-10 ENCOUNTER — Inpatient Hospital Stay: Payer: Medicare Other | Attending: Hematology and Oncology | Admitting: Hematology and Oncology

## 2020-04-10 ENCOUNTER — Other Ambulatory Visit: Payer: Self-pay

## 2020-04-10 ENCOUNTER — Encounter: Payer: Self-pay | Admitting: Hematology and Oncology

## 2020-04-10 DIAGNOSIS — D45 Polycythemia vera: Secondary | ICD-10-CM | POA: Insufficient documentation

## 2020-04-10 DIAGNOSIS — D630 Anemia in neoplastic disease: Secondary | ICD-10-CM

## 2020-04-10 DIAGNOSIS — I480 Paroxysmal atrial fibrillation: Secondary | ICD-10-CM | POA: Insufficient documentation

## 2020-04-10 DIAGNOSIS — Z9221 Personal history of antineoplastic chemotherapy: Secondary | ICD-10-CM | POA: Insufficient documentation

## 2020-04-10 DIAGNOSIS — Z79899 Other long term (current) drug therapy: Secondary | ICD-10-CM | POA: Diagnosis not present

## 2020-04-10 DIAGNOSIS — Z7901 Long term (current) use of anticoagulants: Secondary | ICD-10-CM | POA: Insufficient documentation

## 2020-04-10 DIAGNOSIS — D649 Anemia, unspecified: Secondary | ICD-10-CM | POA: Diagnosis not present

## 2020-04-10 LAB — CBC WITH DIFFERENTIAL/PLATELET
Abs Immature Granulocytes: 0.03 10*3/uL (ref 0.00–0.07)
Basophils Absolute: 0 10*3/uL (ref 0.0–0.1)
Basophils Relative: 0 %
Eosinophils Absolute: 0 10*3/uL (ref 0.0–0.5)
Eosinophils Relative: 0 %
HCT: 35.2 % — ABNORMAL LOW (ref 36.0–46.0)
Hemoglobin: 11.7 g/dL — ABNORMAL LOW (ref 12.0–15.0)
Immature Granulocytes: 0 %
Lymphocytes Relative: 17 %
Lymphs Abs: 1.2 10*3/uL (ref 0.7–4.0)
MCH: 38.4 pg — ABNORMAL HIGH (ref 26.0–34.0)
MCHC: 33.2 g/dL (ref 30.0–36.0)
MCV: 115.4 fL — ABNORMAL HIGH (ref 80.0–100.0)
Monocytes Absolute: 0.7 10*3/uL (ref 0.1–1.0)
Monocytes Relative: 10 %
Neutro Abs: 5.2 10*3/uL (ref 1.7–7.7)
Neutrophils Relative %: 73 %
Platelets: 423 10*3/uL — ABNORMAL HIGH (ref 150–400)
RBC: 3.05 MIL/uL — ABNORMAL LOW (ref 3.87–5.11)
RDW: 15.6 % — ABNORMAL HIGH (ref 11.5–15.5)
WBC: 7.3 10*3/uL (ref 4.0–10.5)
nRBC: 0 % (ref 0.0–0.2)

## 2020-04-10 NOTE — Progress Notes (Signed)
[place] OFFICE PROGRESS NOTE  Patient Care Team: Gayland Curry, DO as PCP - General (Geriatric Medicine) Larey Dresser, MD as PCP - Cardiology (Cardiology) [facility], [facility], Clent Demark, MD as Consulting Physician (Ophthalmology) Larey Dresser, MD as Consulting Physician (Cardiology) Heath Lark, MD as Consulting Physician (Hematology and Oncology) Clent Jacks, MD as Consulting Physician (Ophthalmology) Jerrell Belfast, MD as Consulting Physician (Otolaryngology)  ASSESSMENT & PLAN:  Polycythemia vera Her platelet count is slightly elevated and she has mild anemia Her anemia likely related to other health issues such as mild chronic renal failure For now, I do not plan to change the dose of her hydroxyurea She will continue taking 2 capsules daily from Mondays to Fridays and 1 capsule daily on Saturdays and Sunday I plan to see her again in 3 months for further follow-up  Anemia in neoplastic disease The cause of her anemia is multifactorial, likely secondary to mild renal failure and side effects of hydroxyurea She is not symptomatic Observe for now  Paroxysmal atrial fibrillation [facility]) She will continue anticoagulation therapy as directed by her cardiologist   No orders of the defined types were placed in this encounter.   All questions were answered. The patient knows to call the clinic with any problems, questions or concerns. The total time spent in the appointment was 20 minutes encounter with patients including review of chart and various tests results, discussions about plan of care and coordination of care plan   Heath Lark, MD 04/10/2020 1:27 PM  INTERVAL HISTORY: Please see below for problem oriented charting. She returns for further follow-up She is doing well No recent infection, fever or chills The patient denies any recent signs or symptoms of bleeding such as spontaneous epistaxis, hematuria or  hematochezia. She stated she is taking hydroxyurea as directed  SUMMARY OF ONCOLOGIC HISTORY: Oncology History  Polycythemia vera ([place])  08/05/2011 Pathology Results   Peripheral blood JAK2 mutation was positive with low serum erythropoietin level. Bone marrow aspirate and biopsy was not performed.   08/12/2011 -  Chemotherapy   She is started on hydroxyurea with periodic phlebotomy.     REVIEW OF SYSTEMS:   Constitutional: Denies fevers, chills or abnormal weight loss Eyes: Denies blurriness of vision Ears, nose, mouth, throat, and face: Denies mucositis or sore throat Respiratory: Denies cough, dyspnea or wheezes Cardiovascular: Denies palpitation, chest discomfort or lower extremity swelling Gastrointestinal:  Denies nausea, heartburn or change in bowel habits Skin: Denies abnormal skin rashes Lymphatics: Denies new lymphadenopathy or easy bruising Neurological:Denies numbness, tingling or new weaknesses Behavioral/Psych: Mood is stable, no new changes  All other systems were reviewed with the patient and are negative.  I have reviewed the past medical history, past surgical history, social history and family history with the patient and they are unchanged from previous note.  ALLERGIES:  is allergic to akten [lidocaine], amlodipine, and augmentin [amoxicillin-pot clavulanate].  MEDICATIONS:  Current Outpatient Medications  Medication Sig Dispense Refill  . acetaminophen (TYLENOL) 325 MG tablet Take 650 mg by mouth every 4 (four) hours as needed.    [name] Kitchen ELIQUIS 2.5 MG TABS tablet TAKE 1 TABLET BY MOUTH TWICE DAILY. 180 tablet 3  . fentaNYL (DURAGESIC) 12 MCG/HR Place one patch onto the skin every 3 days. Remove old patch 10 patch 0  . gabapentin (NEURONTIN) 100 MG capsule Take 1 capsule (100 mg total) by mouth daily. 90 capsule 3  . hydroxyurea (HYDREA) 500 MG capsule 2 capsules daily  from Mondays to Fridays and 1 capsule daily on Saturdays and Sunday 100 capsule 3  .  levothyroxine (SYNTHROID) 100 MCG tablet TAKE 1 TABLET ONCE DAILY BEFORE BREAKFAST. 30 tablet 5  . Magnesium 250 MG TABS Take 250 mg by mouth daily.     . Melatonin 10 MG TABS Take 10 mg by mouth at bedtime.    . metolazone (ZAROXOLYN) 2.5 MG tablet Take 2.5 mg by mouth once a week.    . metoprolol succinate (TOPROL-XL) 25 MG 24 hr tablet Take 12.5 mg by mouth in the morning and at bedtime.    . Multiple Vitamins-Minerals (PRESERVISION AREDS 2) CAPS Take 1 capsule by mouth 2 (two) times daily.     . potassium chloride SA (KLOR-CON) 20 MEQ tablet Take 1 tablet (20 mEq total) by mouth once a week. Every Wednesday when you take Metolazone 10 tablet 3  . sennosides-docusate sodium (SENOKOT-S) 8.6-50 MG tablet Take 2 tablets by mouth 2 (two) times daily.    . sodium chloride (OCEAN) 0.65 % SOLN nasal spray Place 2 sprays into both nostrils 2 (two) times daily.    [name] Kitchen torsemide (DEMADEX) 20 MG tablet Take 4 tablets (80 mg total) by mouth daily. 360 tablet 3  . traMADol (ULTRAM) 50 MG tablet TAKE ONE TABLET AT BEDTIME. 30 tablet 5   No current facility-administered medications for this visit.    PHYSICAL EXAMINATION: ECOG PERFORMANCE STATUS: 1 - Symptomatic but completely ambulatory  Vitals:   04/10/20 1234  BP: 104/64  Pulse: 65  Resp: 17  Temp: 97.8 F (36.6 C)  SpO2: 97%   Filed Weights   04/10/20 1234  Weight: 103 lb 3.2 oz (46.8 kg)    GENERAL:alert, no distress and comfortable NEURO: alert & oriented x 3 with fluent speech, no focal motor/sensory deficits  LABORATORY DATA:  I have reviewed the data as listed    Component Value Date/Time   NA 141 01/17/2020 1234   NA 137 12/06/2019 0000   NA 136 09/14/2016 1428   K 3.8 01/17/2020 1234   K 4.0 09/14/2016 1428   CL 98 01/17/2020 1234   CO2 32 01/17/2020 1234   CO2 30 (H) 09/14/2016 1428   GLUCOSE 92 01/17/2020 1234   GLUCOSE 112 09/14/2016 1428   BUN 63 (H) 01/17/2020 1234   BUN 38 (A) 12/06/2019 0000   BUN 14.7  09/14/2016 1428   CREATININE 1.27 (H) 01/17/2020 1234   CREATININE 1.30 (H) 12/27/2019 1137   CREATININE 0.8 09/14/2016 1428   CALCIUM 11.2 (H) 01/17/2020 1234   CALCIUM 11.0 (H) 09/14/2016 1428   PROT 6.6 06/19/2019 1435   PROT 7.0 09/14/2016 1428   ALBUMIN 4.2 06/19/2019 1435   ALBUMIN 4.6 09/14/2016 1428   AST 17 06/19/2019 1435   AST 17 09/14/2016 1428   ALT 19 06/19/2019 1435   ALT 18 09/14/2016 1428   ALKPHOS 48 06/19/2019 1435   ALKPHOS 62 09/14/2016 1428   BILITOT 1.1 06/19/2019 1435   BILITOT 1.00 09/14/2016 1428   GFRNONAA 41 (L) 01/17/2020 1234   GFRAA 46 (L) 10/05/2019 1243    No results found for: SPEP, UPEP  Lab Results  Component Value Date   WBC 7.3 04/10/2020   NEUTROABS 5.2 04/10/2020   HGB 11.7 (L) 04/10/2020   HCT 35.2 (L) 04/10/2020   MCV 115.4 (H) 04/10/2020   PLT 423 (H) 04/10/2020      Chemistry      Component Value Date/Time   NA 141 01/17/2020 1234  NA 137 12/06/2019 0000   NA 136 09/14/2016 1428   K 3.8 01/17/2020 1234   K 4.0 09/14/2016 1428   CL 98 01/17/2020 1234   CO2 32 01/17/2020 1234   CO2 30 (H) 09/14/2016 1428   BUN 63 (H) 01/17/2020 1234   BUN 38 (A) 12/06/2019 0000   BUN 14.7 09/14/2016 1428   CREATININE 1.27 (H) 01/17/2020 1234   CREATININE 1.30 (H) 12/27/2019 1137   CREATININE 0.8 09/14/2016 1428   GLU 106 12/06/2019 0000      Component Value Date/Time   CALCIUM 11.2 (H) 01/17/2020 1234   CALCIUM 11.0 (H) 09/14/2016 1428   ALKPHOS 48 06/19/2019 1435   ALKPHOS 62 09/14/2016 1428   AST 17 06/19/2019 1435   AST 17 09/14/2016 1428   ALT 19 06/19/2019 1435   ALT 18 09/14/2016 1428   BILITOT 1.1 06/19/2019 1435   BILITOT 1.00 09/14/2016 1428       RADIOGRAPHIC STUDIES: I have personally reviewed the radiological images as listed and agreed with the findings in the report. CT Head Wo Contrast  Result Date: 03/28/2020 CLINICAL DATA:  Headache chronic EXAM: CT HEAD WITHOUT CONTRAST TECHNIQUE: Contiguous axial  images were obtained from the base of the skull through the vertex without intravenous contrast. COMPARISON:  CT head 06/16/2015 FINDINGS: Brain: Cerebral ventricle sizes are concordant with the degree of cerebral volume loss. No evidence of large-territorial acute infarction. No parenchymal hemorrhage. No mass lesion. No extra-axial collection. No mass effect or midline shift. No hydrocephalus. Basilar cisterns are patent. Vascular: No hyperdense vessel. Atherosclerotic calcifications are present within the cavernous internal carotid and vertebral arteries. Skull: No acute fracture or focal lesion. Sinuses/Orbits: Paranasal sinuses and mastoid air cells are clear. Bilateral lens replacements. Otherwise the orbits are unremarkable. Other: None. IMPRESSION: No acute intracranial abnormality. Electronically Signed   By: Iven Finn M.D.   On: 03/28/2020 03:24   [place] LIMITED JOINT SPACE STRUCTURES LOW RIGHT(NO LINKED CHARGES)  Result Date: 04/08/2020 Procedure: Real-time Ultrasound Guided Injection of right knee superior lateral patellar space Device: Philips Affiniti 50G Images permanently stored and available for review in PACS Verbal informed consent obtained. Discussed risks and benefits of procedure. Warned about infection bleeding damage to structures skin hypopigmentation and fat atrophy among others. Patient expresses understanding and agreement Time-out conducted.  Noted no overlying erythema, induration, or other signs of local infection.  Skin prepped in a sterile fashion.  Local anesthesia: Topical Ethyl chloride.  With sterile technique and under real time ultrasound guidance: 40 mg of Kenalog and 2 mL of Marcaine injected into knee joint. Fluid seen entering the joint capsule.  Completed without difficulty  Pain immediately resolved suggesting accurate placement of the medication.  Advised to call if fevers/chills, erythema, induration, drainage, or persistent bleeding.  Images permanently  stored and available for review in the ultrasound unit. Impression: Technically successful ultrasound guided injection.  Intravitreal Injection, Pharmacologic Agent - OD - Right Eye  Result Date: 03/18/2020 Time Out 03/18/2020. 12:03 PM. Confirmed correct patient, procedure, site, and patient consented. Anesthesia Topical anesthesia was used. Anesthetic medications included Lidocaine 4%. Procedure Preparation included 10% betadine to eyelids, 5% betadine to ocular surface, Tobramycin 0.3%. A 30 gauge needle was used. Injection: 2 mg aflibercept Alfonse Flavors) SOLN   NDC: [id number], Lot: [id number]   Route: Intravitreal, Site: Right Eye, Waste: 0 mg Post-op Post injection exam found visual acuity of at least counting fingers. The patient tolerated the procedure well. There were no complications. The patient received  written and verbal post procedure care education. Notes Lidocaine topically applied inferotemporal with sterile Q-tip.  OCT, Retina - OU - Both Eyes  Result Date: 03/18/2020 Right Eye Quality was good. Scan locations included subfoveal. Central Foveal Thickness: 207. Progression has been stable. Findings include central retinal atrophy, outer retinal atrophy. Left Eye Quality was good. Scan locations included subfoveal. Central Foveal Thickness: 411. Progression has been stable. Findings include disciform scar, central retinal atrophy, outer retinal atrophy, subretinal scarring. 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  Repeat injection today and examination OD in 3 months OS currently 5 weeks post most recent evaluation and injection antivegF.

## 2020-04-10 NOTE — Assessment & Plan Note (Signed)
The cause of her anemia is multifactorial, likely secondary to mild renal failure and side effects of hydroxyurea She is not symptomatic Observe for now

## 2020-04-10 NOTE — Assessment & Plan Note (Addendum)
Her platelet count is slightly elevated and she has mild anemia Her anemia likely related to other health issues such as mild chronic renal failure For now, I do not plan to change the dose of her hydroxyurea She will continue taking 2 capsules daily from Mondays to Fridays and 1 capsule daily on Saturdays and Sunday I plan to see her again in 3 months for further follow-up

## 2020-04-10 NOTE — Assessment & Plan Note (Signed)
She will continue anticoagulation therapy as directed by her cardiologist

## 2020-04-14 ENCOUNTER — Telehealth: Payer: Self-pay | Admitting: Hematology and Oncology

## 2020-04-14 NOTE — Telephone Encounter (Signed)
Scheduled appointments per 03/03 schedule message. Contacted patient, patient is aware.

## 2020-04-15 ENCOUNTER — Ambulatory Visit: Payer: Medicare Other | Admitting: Family Medicine

## 2020-05-01 ENCOUNTER — Ambulatory Visit
Admission: RE | Admit: 2020-05-01 | Discharge: 2020-05-01 | Disposition: A | Discharge status: Home / Self Care | Payer: Medicare Other | Source: Ambulatory Visit | Attending: Internal Medicine | Admitting: Internal Medicine

## 2020-05-01 DIAGNOSIS — G44211 Episodic tension-type headache, intractable: Secondary | ICD-10-CM

## 2020-05-01 DIAGNOSIS — M2548 Effusion, other site: Secondary | ICD-10-CM | POA: Diagnosis not present

## 2020-05-01 DIAGNOSIS — R519 Headache, unspecified: Secondary | ICD-10-CM

## 2020-05-01 DIAGNOSIS — G44221 Chronic tension-type headache, intractable: Secondary | ICD-10-CM | POA: Diagnosis not present

## 2020-05-01 DIAGNOSIS — H748X1 Other specified disorders of right middle ear and mastoid: Secondary | ICD-10-CM | POA: Diagnosis not present

## 2020-05-02 ENCOUNTER — Encounter: Payer: Self-pay | Admitting: Internal Medicine

## 2020-05-02 NOTE — Telephone Encounter (Signed)
Message routed to Cindi Carbon, NP to review and reply directly back to patient/family

## 2020-05-05 DIAGNOSIS — R519 Headache, unspecified: Secondary | ICD-10-CM | POA: Diagnosis not present

## 2020-05-06 ENCOUNTER — Other Ambulatory Visit: Payer: Self-pay

## 2020-05-06 ENCOUNTER — Emergency Department (HOSPITAL_COMMUNITY): Payer: Medicare Other

## 2020-05-06 ENCOUNTER — Inpatient Hospital Stay (HOSPITAL_COMMUNITY)
Admission: EM | Admit: 2020-05-06 | Discharge: 2020-05-09 | DRG: 871 | Disposition: E | Payer: Medicare Other | Attending: Internal Medicine | Admitting: Internal Medicine

## 2020-05-06 DIAGNOSIS — I5032 Chronic diastolic (congestive) heart failure: Secondary | ICD-10-CM | POA: Diagnosis present

## 2020-05-06 DIAGNOSIS — J9811 Atelectasis: Secondary | ICD-10-CM | POA: Diagnosis not present

## 2020-05-06 DIAGNOSIS — R6520 Severe sepsis without septic shock: Secondary | ICD-10-CM | POA: Diagnosis not present

## 2020-05-06 DIAGNOSIS — A419 Sepsis, unspecified organism: Secondary | ICD-10-CM | POA: Diagnosis present

## 2020-05-06 DIAGNOSIS — I4811 Longstanding persistent atrial fibrillation: Secondary | ICD-10-CM

## 2020-05-06 DIAGNOSIS — M109 Gout, unspecified: Secondary | ICD-10-CM | POA: Diagnosis present

## 2020-05-06 DIAGNOSIS — Z20822 Contact with and (suspected) exposure to covid-19: Secondary | ICD-10-CM | POA: Diagnosis present

## 2020-05-06 DIAGNOSIS — M5030 Other cervical disc degeneration, unspecified cervical region: Secondary | ICD-10-CM | POA: Diagnosis present

## 2020-05-06 DIAGNOSIS — E872 Acidosis, unspecified: Secondary | ICD-10-CM

## 2020-05-06 DIAGNOSIS — Z79899 Other long term (current) drug therapy: Secondary | ICD-10-CM

## 2020-05-06 DIAGNOSIS — G9341 Metabolic encephalopathy: Secondary | ICD-10-CM | POA: Diagnosis present

## 2020-05-06 DIAGNOSIS — Z888 Allergy status to other drugs, medicaments and biological substances status: Secondary | ICD-10-CM

## 2020-05-06 DIAGNOSIS — I2510 Atherosclerotic heart disease of native coronary artery without angina pectoris: Secondary | ICD-10-CM | POA: Diagnosis present

## 2020-05-06 DIAGNOSIS — A4159 Other Gram-negative sepsis: Secondary | ICD-10-CM | POA: Diagnosis present

## 2020-05-06 DIAGNOSIS — G9340 Encephalopathy, unspecified: Secondary | ICD-10-CM | POA: Diagnosis not present

## 2020-05-06 DIAGNOSIS — I130 Hypertensive heart and chronic kidney disease with heart failure and stage 1 through stage 4 chronic kidney disease, or unspecified chronic kidney disease: Secondary | ICD-10-CM | POA: Diagnosis present

## 2020-05-06 DIAGNOSIS — G8929 Other chronic pain: Secondary | ICD-10-CM | POA: Diagnosis present

## 2020-05-06 DIAGNOSIS — Z87891 Personal history of nicotine dependence: Secondary | ICD-10-CM

## 2020-05-06 DIAGNOSIS — Z515 Encounter for palliative care: Secondary | ICD-10-CM

## 2020-05-06 DIAGNOSIS — I509 Heart failure, unspecified: Secondary | ICD-10-CM | POA: Diagnosis not present

## 2020-05-06 DIAGNOSIS — M1990 Unspecified osteoarthritis, unspecified site: Secondary | ICD-10-CM | POA: Diagnosis present

## 2020-05-06 DIAGNOSIS — E7800 Pure hypercholesterolemia, unspecified: Secondary | ICD-10-CM | POA: Diagnosis present

## 2020-05-06 DIAGNOSIS — I272 Pulmonary hypertension, unspecified: Secondary | ICD-10-CM | POA: Diagnosis present

## 2020-05-06 DIAGNOSIS — H9192 Unspecified hearing loss, left ear: Secondary | ICD-10-CM | POA: Diagnosis present

## 2020-05-06 DIAGNOSIS — E039 Hypothyroidism, unspecified: Secondary | ICD-10-CM | POA: Diagnosis present

## 2020-05-06 DIAGNOSIS — N182 Chronic kidney disease, stage 2 (mild): Secondary | ICD-10-CM | POA: Diagnosis present

## 2020-05-06 DIAGNOSIS — Z8249 Family history of ischemic heart disease and other diseases of the circulatory system: Secondary | ICD-10-CM

## 2020-05-06 DIAGNOSIS — Z8041 Family history of malignant neoplasm of ovary: Secondary | ICD-10-CM

## 2020-05-06 DIAGNOSIS — R0789 Other chest pain: Secondary | ICD-10-CM | POA: Diagnosis not present

## 2020-05-06 DIAGNOSIS — I517 Cardiomegaly: Secondary | ICD-10-CM | POA: Diagnosis not present

## 2020-05-06 DIAGNOSIS — R7881 Bacteremia: Secondary | ICD-10-CM

## 2020-05-06 DIAGNOSIS — N170 Acute kidney failure with tubular necrosis: Secondary | ICD-10-CM | POA: Diagnosis not present

## 2020-05-06 DIAGNOSIS — E162 Hypoglycemia, unspecified: Secondary | ICD-10-CM | POA: Diagnosis not present

## 2020-05-06 DIAGNOSIS — Z66 Do not resuscitate: Secondary | ICD-10-CM | POA: Diagnosis present

## 2020-05-06 DIAGNOSIS — E861 Hypovolemia: Secondary | ICD-10-CM | POA: Diagnosis present

## 2020-05-06 DIAGNOSIS — M810 Age-related osteoporosis without current pathological fracture: Secondary | ICD-10-CM | POA: Diagnosis present

## 2020-05-06 DIAGNOSIS — N136 Pyonephrosis: Secondary | ICD-10-CM | POA: Diagnosis present

## 2020-05-06 DIAGNOSIS — R0902 Hypoxemia: Secondary | ICD-10-CM | POA: Diagnosis not present

## 2020-05-06 DIAGNOSIS — N281 Cyst of kidney, acquired: Secondary | ICD-10-CM | POA: Diagnosis not present

## 2020-05-06 DIAGNOSIS — M549 Dorsalgia, unspecified: Secondary | ICD-10-CM | POA: Diagnosis present

## 2020-05-06 DIAGNOSIS — Z7901 Long term (current) use of anticoagulants: Secondary | ICD-10-CM

## 2020-05-06 DIAGNOSIS — Z881 Allergy status to other antibiotic agents status: Secondary | ICD-10-CM

## 2020-05-06 DIAGNOSIS — R6521 Severe sepsis with septic shock: Secondary | ICD-10-CM | POA: Diagnosis present

## 2020-05-06 DIAGNOSIS — R000 Tachycardia, unspecified: Secondary | ICD-10-CM | POA: Diagnosis not present

## 2020-05-06 DIAGNOSIS — Z803 Family history of malignant neoplasm of breast: Secondary | ICD-10-CM

## 2020-05-06 DIAGNOSIS — R4182 Altered mental status, unspecified: Secondary | ICD-10-CM | POA: Diagnosis not present

## 2020-05-06 DIAGNOSIS — D751 Secondary polycythemia: Secondary | ICD-10-CM | POA: Diagnosis present

## 2020-05-06 DIAGNOSIS — R54 Age-related physical debility: Secondary | ICD-10-CM | POA: Diagnosis present

## 2020-05-06 DIAGNOSIS — N179 Acute kidney failure, unspecified: Secondary | ICD-10-CM | POA: Diagnosis present

## 2020-05-06 DIAGNOSIS — E876 Hypokalemia: Secondary | ICD-10-CM | POA: Diagnosis present

## 2020-05-06 DIAGNOSIS — Z79890 Hormone replacement therapy (postmenopausal): Secondary | ICD-10-CM

## 2020-05-06 DIAGNOSIS — R531 Weakness: Secondary | ICD-10-CM | POA: Diagnosis not present

## 2020-05-06 DIAGNOSIS — R0602 Shortness of breath: Secondary | ICD-10-CM | POA: Diagnosis not present

## 2020-05-06 LAB — I-STAT VENOUS BLOOD GAS, ED
Acid-Base Excess: 7 mmol/L — ABNORMAL HIGH (ref 0.0–2.0)
Bicarbonate: 29.7 mmol/L — ABNORMAL HIGH (ref 20.0–28.0)
Calcium, Ion: 1.25 mmol/L (ref 1.15–1.40)
HCT: 38 % (ref 36.0–46.0)
Hemoglobin: 12.9 g/dL (ref 12.0–15.0)
O2 Saturation: 87 %
Potassium: 2.9 mmol/L — ABNORMAL LOW (ref 3.5–5.1)
Sodium: 138 mmol/L (ref 135–145)
TCO2: 31 mmol/L (ref 22–32)
pCO2, Ven: 36.3 mmHg — ABNORMAL LOW (ref 44.0–60.0)
pH, Ven: 7.521 — ABNORMAL HIGH (ref 7.250–7.430)
pO2, Ven: 47 mmHg — ABNORMAL HIGH (ref 32.0–45.0)

## 2020-05-06 LAB — AMMONIA: Ammonia: 44 umol/L — ABNORMAL HIGH (ref 9–35)

## 2020-05-06 LAB — CBC WITH DIFFERENTIAL/PLATELET
Abs Immature Granulocytes: 0 10*3/uL (ref 0.00–0.07)
Basophils Absolute: 0 10*3/uL (ref 0.0–0.1)
Basophils Relative: 0 %
Eosinophils Absolute: 0 10*3/uL (ref 0.0–0.5)
Eosinophils Relative: 0 %
HCT: 37.2 % (ref 36.0–46.0)
Hemoglobin: 12.5 g/dL (ref 12.0–15.0)
Lymphocytes Relative: 8 %
Lymphs Abs: 1.6 10*3/uL (ref 0.7–4.0)
MCH: 38.7 pg — ABNORMAL HIGH (ref 26.0–34.0)
MCHC: 33.6 g/dL (ref 30.0–36.0)
MCV: 115.2 fL — ABNORMAL HIGH (ref 80.0–100.0)
Monocytes Absolute: 1.9 10*3/uL — ABNORMAL HIGH (ref 0.1–1.0)
Monocytes Relative: 10 %
Neutro Abs: 15.9 10*3/uL — ABNORMAL HIGH (ref 1.7–7.7)
Neutrophils Relative %: 82 %
Platelets: 367 10*3/uL (ref 150–400)
RBC: 3.23 MIL/uL — ABNORMAL LOW (ref 3.87–5.11)
RDW: 16.6 % — ABNORMAL HIGH (ref 11.5–15.5)
WBC: 19.4 10*3/uL — ABNORMAL HIGH (ref 4.0–10.5)
nRBC: 0 /100 WBC
nRBC: 0.1 % (ref 0.0–0.2)

## 2020-05-06 LAB — PROTIME-INR
INR: 1.7 — ABNORMAL HIGH (ref 0.8–1.2)
Prothrombin Time: 19 seconds — ABNORMAL HIGH (ref 11.4–15.2)

## 2020-05-06 LAB — COMPREHENSIVE METABOLIC PANEL
ALT: 18 U/L (ref 0–44)
AST: 21 U/L (ref 15–41)
Albumin: 3.6 g/dL (ref 3.5–5.0)
Alkaline Phosphatase: 83 U/L (ref 38–126)
Anion gap: 16 — ABNORMAL HIGH (ref 5–15)
BUN: 90 mg/dL — ABNORMAL HIGH (ref 8–23)
CO2: 25 mmol/L (ref 22–32)
Calcium: 10.4 mg/dL — ABNORMAL HIGH (ref 8.9–10.3)
Chloride: 97 mmol/L — ABNORMAL LOW (ref 98–111)
Creatinine, Ser: 1.95 mg/dL — ABNORMAL HIGH (ref 0.44–1.00)
GFR, Estimated: 24 mL/min — ABNORMAL LOW (ref >60)
Glucose, Bld: 110 mg/dL — ABNORMAL HIGH (ref 70–99)
Potassium: 3.2 mmol/L — ABNORMAL LOW (ref 3.5–5.1)
Sodium: 138 mmol/L (ref 135–145)
Total Bilirubin: 1.4 mg/dL — ABNORMAL HIGH (ref 0.3–1.2)
Total Protein: 6.7 g/dL (ref 6.5–8.1)

## 2020-05-06 LAB — URINALYSIS, ROUTINE W REFLEX MICROSCOPIC
Bilirubin Urine: NEGATIVE
Glucose, UA: NEGATIVE mg/dL
Ketones, ur: NEGATIVE mg/dL
Nitrite: NEGATIVE
Protein, ur: 100 mg/dL — AB
Specific Gravity, Urine: 1.014 (ref 1.005–1.030)
WBC, UA: >50 WBC/hpf — ABNORMAL HIGH (ref 0–5)
pH: 5 (ref 5.0–8.0)

## 2020-05-06 LAB — RAPID URINE DRUG SCREEN, HOSP PERFORMED
Amphetamines: NOT DETECTED
Barbiturates: NOT DETECTED
Benzodiazepines: NOT DETECTED
Cocaine: NOT DETECTED
Opiates: NOT DETECTED
Tetrahydrocannabinol: NOT DETECTED

## 2020-05-06 LAB — LACTIC ACID, PLASMA
Lactic Acid, Venous: 2 mmol/L (ref 0.5–1.9)
Lactic Acid, Venous: 2 mmol/L (ref 0.5–1.9)

## 2020-05-06 LAB — RESP PANEL BY RT-PCR (FLU A&B, COVID) ARPGX2
Influenza A by PCR: NEGATIVE
Influenza B by PCR: NEGATIVE
SARS Coronavirus 2 by RT PCR: NEGATIVE

## 2020-05-06 LAB — MAGNESIUM: Magnesium: 2.3 mg/dL (ref 1.7–2.4)

## 2020-05-06 LAB — TROPONIN I (HIGH SENSITIVITY)
Troponin I (High Sensitivity): 82 ng/L — ABNORMAL HIGH (ref <18)
Troponin I (High Sensitivity): 78 ng/L — ABNORMAL HIGH (ref <18)

## 2020-05-06 LAB — LIPASE, BLOOD: Lipase: 34 U/L (ref 11–51)

## 2020-05-06 LAB — CK: Total CK: 103 U/L (ref 38–234)

## 2020-05-06 LAB — BRAIN NATRIURETIC PEPTIDE: B Natriuretic Peptide: 1137.6 pg/mL — ABNORMAL HIGH (ref 0.0–100.0)

## 2020-05-06 MED ORDER — LACTATED RINGERS IV BOLUS (SEPSIS)
1000.0000 mL | Freq: Once | INTRAVENOUS | Status: AC
  Administered 2020-05-06: 1000 mL via INTRAVENOUS

## 2020-05-06 MED ORDER — MELATONIN 5 MG PO TABS
10.0000 mg | ORAL_TABLET | Freq: Every day | ORAL | Status: DC
  Administered 2020-05-06 – 2020-05-07 (×2): 10 mg via ORAL
  Filled 2020-05-06 (×2): qty 2

## 2020-05-06 MED ORDER — TRAMADOL HCL 50 MG PO TABS
50.0000 mg | ORAL_TABLET | Freq: Every day | ORAL | Status: DC
  Administered 2020-05-06 – 2020-05-07 (×2): 50 mg via ORAL
  Filled 2020-05-06 (×2): qty 1

## 2020-05-06 MED ORDER — HYDROXYUREA 500 MG PO CAPS: 500.0000 mg | ORAL_CAPSULE | ORAL | Status: DC

## 2020-05-06 MED ORDER — VANCOMYCIN VARIABLE DOSE PER UNSTABLE RENAL FUNCTION (PHARMACIST DOSING): Status: DC

## 2020-05-06 MED ORDER — ONDANSETRON HCL 4 MG/2ML IJ SOLN: 4.0000 mg | Freq: Four times a day (QID) | INTRAMUSCULAR | Status: DC | PRN

## 2020-05-06 MED ORDER — GABAPENTIN 100 MG PO CAPS
100.0000 mg | ORAL_CAPSULE | Freq: Every day | ORAL | Status: DC
  Administered 2020-05-07: 100 mg via ORAL
  Filled 2020-05-06 (×2): qty 1

## 2020-05-06 MED ORDER — HYDROXYUREA 500 MG PO CAPS
1000.0000 mg | ORAL_CAPSULE | ORAL | Status: DC
  Administered 2020-05-07 – 2020-05-08 (×2): 1000 mg via ORAL
  Filled 2020-05-06 (×2): qty 2

## 2020-05-06 MED ORDER — SODIUM CHLORIDE 0.9 % IV SOLN
2.0000 g | Freq: Once | INTRAVENOUS | Status: AC
  Administered 2020-05-06: 2 g via INTRAVENOUS
  Filled 2020-05-06: qty 2

## 2020-05-06 MED ORDER — SODIUM CHLORIDE 0.9 % IV SOLN: 2.0000 g | INTRAVENOUS | Status: DC

## 2020-05-06 MED ORDER — LACTATED RINGERS IV SOLN: INTRAVENOUS | Status: DC

## 2020-05-06 MED ORDER — POTASSIUM CHLORIDE CRYS ER 20 MEQ PO TBCR
40.0000 meq | EXTENDED_RELEASE_TABLET | ORAL | Status: AC
  Administered 2020-05-06 (×2): 40 meq via ORAL
  Filled 2020-05-06 (×2): qty 2

## 2020-05-06 MED ORDER — METOPROLOL TARTRATE 5 MG/5ML IV SOLN
2.5000 mg | Freq: Four times a day (QID) | INTRAVENOUS | Status: DC | PRN
  Administered 2020-05-06 – 2020-05-07 (×3): 2.5 mg via INTRAVENOUS
  Filled 2020-05-06 (×3): qty 5

## 2020-05-06 MED ORDER — APIXABAN 2.5 MG PO TABS
2.5000 mg | ORAL_TABLET | Freq: Two times a day (BID) | ORAL | Status: DC
  Administered 2020-05-06 – 2020-05-08 (×4): 2.5 mg via ORAL
  Filled 2020-05-06 (×4): qty 1

## 2020-05-06 MED ORDER — LACTATED RINGERS IV BOLUS (SEPSIS)
500.0000 mL | Freq: Once | INTRAVENOUS | Status: AC
Start: 2020-05-06 — End: 2020-05-06
  Administered 2020-05-06: 500 mL via INTRAVENOUS

## 2020-05-06 MED ORDER — HYDROXYUREA 500 MG PO CAPS: 1000.0000 mg | ORAL_CAPSULE | Freq: Every day | ORAL | Status: DC

## 2020-05-06 MED ORDER — VANCOMYCIN HCL 1000 MG/200ML IV SOLN
1000.0000 mg | Freq: Once | INTRAVENOUS | Status: AC
  Administered 2020-05-06: 1000 mg via INTRAVENOUS
  Filled 2020-05-06: qty 200

## 2020-05-06 MED ORDER — SALINE SPRAY 0.65 % NA SOLN
2.0000 | Freq: Two times a day (BID) | NASAL | Status: DC
  Administered 2020-05-06 – 2020-05-08 (×3): 2 via NASAL
  Filled 2020-05-06: qty 44

## 2020-05-06 MED ORDER — LEVOTHYROXINE SODIUM 100 MCG PO TABS
100.0000 ug | ORAL_TABLET | Freq: Every day | ORAL | Status: DC
  Administered 2020-05-07 – 2020-05-08 (×2): 100 ug via ORAL
  Filled 2020-05-06 (×2): qty 1

## 2020-05-06 MED ORDER — METRONIDAZOLE IN NACL 5-0.79 MG/ML-% IV SOLN
500.0000 mg | Freq: Once | INTRAVENOUS | Status: AC
  Administered 2020-05-06: 500 mg via INTRAVENOUS
  Filled 2020-05-06: qty 100

## 2020-05-06 MED ORDER — HYDROXYUREA 500 MG PO CAPS: 1000.0000 mg | ORAL_CAPSULE | ORAL | Status: DC

## 2020-05-06 MED ORDER — ONDANSETRON HCL 4 MG PO TABS: 4.0000 mg | ORAL_TABLET | Freq: Four times a day (QID) | ORAL | Status: DC | PRN

## 2020-05-06 MED ORDER — SODIUM CHLORIDE 0.9 % IV SOLN
2.0000 g | Freq: Once | INTRAVENOUS | Status: DC
Start: 2020-05-06 — End: 2020-05-06

## 2020-05-06 MED ORDER — ACETAMINOPHEN 325 MG PO TABS
650.0000 mg | ORAL_TABLET | ORAL | Status: DC | PRN
  Administered 2020-05-06: 650 mg via ORAL
  Filled 2020-05-06: qty 2

## 2020-05-06 MED ORDER — SENNOSIDES-DOCUSATE SODIUM 8.6-50 MG PO TABS
2.0000 | ORAL_TABLET | Freq: Two times a day (BID) | ORAL | Status: DC
  Administered 2020-05-06 – 2020-05-08 (×4): 2 via ORAL
  Filled 2020-05-06 (×4): qty 2

## 2020-05-06 NOTE — ED Notes (Signed)
Lab called about delay on UA, states will receive sample and begin running now

## 2020-05-06 NOTE — ED Notes (Signed)
Lactic acid 2.0 per lab tech. ED provider to be made aware

## 2020-05-06 NOTE — H&P (Signed)
History and Physical    Sarah Mays XVQ:[id number] DOB: 1930/07/05 DOA: 04/11/2020  PCP: Royal Hawthorn, NP (Confirm with patient/family/NH records and if not entered, this has to be entered at [facility] point of entry) Patient coming from: [facility]  I have personally briefly reviewed patient's old medical records in [place]  Chief Complaint: fever, AMS, dirty urine  HPI: Sarah Mays is a 87 y.o. female with medical history significant of chronic diastolic CHF, PAF on Eliquis, polycythemia vera on hydroxyurea, severe pulmonary hypertension, hypothyroidism presented with UTI like symptoms.  Patient confused and lethargic, most history provided by patient daughter at bedside.  At baseline patient lives in senior assisted living facility, independent however the last 2 days it was found that patient became more lethargic and spiking low-grade fever and the facility staff reported "her urine smelled strong" ED Course: Spike low grade fever of 100.9 in ED, UA pending and Lactic acid 2.0 and WBC 19 with left shift.  Review of Systems: Unable to perform, patient lethargic  Past Medical History:  Diagnosis Date  . Abnormal stress test    a. 11/2011 Ex MV: EF 80%, small, partially reversible anteroapical defect->mild ischemia vs attenuation-->med Rx.  [name] Kitchen Alopecia, unspecified   . Arthritis   . Chronic diastolic CHF (congestive heart failure) ([place])    a. 11/2013 Echo: EF 60-65%, no rwma, mild TR, PASP 43mmHg.  . Closed fracture of lower end of radius with ulna   . Deafness    Left, s/p multiple surgeries  . Essential hypertension   . Gouty arthropathy, unspecified   . History of kidney stones   . Hypothyroidism   . Insomnia, unspecified   . Macular degeneration    Left, s/p inj. tx  . Mitral valve disorders(424.0)   . Neck mass    right, w/u including MRI negative  . PAF (paroxysmal atrial fibrillation) (HCC)    a. on amio (02/2012 mild obstruction on PFT's) and  xarelto.  . Pneumonia   . Polycythemia vera(238.4)   . Pure hypercholesterolemia   . Senile osteoporosis    Reclast in the past  . Tricuspid valve disorders, specified as nonrheumatic     Past Surgical History:  Procedure Laterality Date  . BREAST BIOPSY  06/18/1998   left  . CARDIOVERSION N/A 07/15/2017   Procedure: CARDIOVERSION;  Surgeon: Larey Dresser, MD;  Location: [facility] ENDOSCOPY;  Service: Cardiovascular;  Laterality: N/A;  . CARDIOVERSION N/A 07/21/2018   Procedure: CARDIOVERSION;  Surgeon: Larey Dresser, MD;  Location: [facility] ENDOSCOPY;  Service: Cardiovascular;  Laterality: N/A;  . CARDIOVERSION N/A 10/06/2018   Procedure: CARDIOVERSION;  Surgeon: Larey Dresser, MD;  Location: [facility] ENDOSCOPY;  Service: Cardiovascular;  Laterality: N/A;  . ELECTROPHYSIOLOGIC STUDY N/A 01/08/2016   Procedure: Atrial Fibrillation Ablation;  Surgeon: Thompson Grayer, MD;  Location: [place] CV LAB;  Service: Cardiovascular;  Laterality: N/A;  . FEMUR IM NAIL Right 10/08/2015   Procedure: INTRAMEDULLARY (IM) NAIL FEMORAL RIGHT;  Surgeon: Paralee Cancel, MD;  Location: [facility] ORS;  Service: Orthopedics;  Laterality: Right;  . MASS EXCISION Right 11/26/2016   Procedure: EXCISION RIGHT LATERAL NECK MASS;  Surgeon: Jerrell Belfast, MD;  Location: [place];  Service: ENT;  Laterality: Right;  . TEE WITHOUT CARDIOVERSION N/A 07/15/2017   Procedure: TRANSESOPHAGEAL ECHOCARDIOGRAM (TEE);  Surgeon: Larey Dresser, MD;  Location: [facility] ENDOSCOPY;  Service: Cardiovascular;  Laterality: N/A;  . TONSILLECTOMY    . TOTAL ABDOMINAL HYSTERECTOMY    . TYMPANOPLASTY Bilateral  reports that she quit smoking about 36 years ago. Her smoking use included cigarettes. She has never used smokeless tobacco. She reports current alcohol use of about 1.0 standard drink of alcohol per week. She reports that she does not use drugs.  Allergies  Allergen Reactions  . Akten [Lidocaine]   . Amlodipine Swelling and Other (See Comments)     Lower extremity swelling   . Augmentin [Amoxicillin-Pot Clavulanate] Other (See Comments)    Stomach Pain/cramps; has tolerated cefazolin, ceftriaxone, cefepime.     Family History  Problem Relation Age of Onset  . Hypertension Father   . Heart disease Father        patient does not know details.   . Ovarian cancer Mother 77  . Cancer Sister        colon  . Heart disease Sister   . Cancer Sister   . Cancer Sister        hodgkin disease  . Breast cancer Daughter   . Cancer Daughter        breast    Prior to Admission medications   Medication Sig Start Date End Date Taking? Authorizing Provider  acetaminophen (TYLENOL) 325 MG tablet Take 650 mg by mouth every 4 (four) hours as needed.    [provider]  ELIQUIS 2.5 MG TABS tablet TAKE 1 TABLET BY MOUTH TWICE DAILY. 11/06/19   Larey Dresser, MD  fentaNYL ([place]) 12 MCG/HR Place one patch onto the skin every 3 days. Remove old patch 04/07/20   Reed, Tiffany L, DO  gabapentin (NEURONTIN) 100 MG capsule Take 1 capsule (100 mg total) by mouth daily. 11/12/19   Reed, Tiffany L, DO  hydroxyurea (HYDREA) 500 MG capsule 2 capsules daily from Mondays to Fridays and 1 capsule daily on Saturdays and Sunday 09/25/19   Heath Lark, MD  levothyroxine (SYNTHROID) 100 MCG tablet TAKE 1 TABLET ONCE DAILY BEFORE BREAKFAST. 03/12/20   Reed, Tiffany L, DO  Magnesium 250 MG TABS Take 250 mg by mouth daily.     [provider]  Melatonin 10 MG TABS Take 10 mg by mouth at bedtime.    [provider]  metolazone (ZAROXOLYN) 2.5 MG tablet Take 2.5 mg by mouth once a week.    [provider]  metoprolol succinate (TOPROL-XL) 25 MG 24 hr tablet Take 12.5 mg by mouth in the morning and at bedtime.    [provider]  Multiple Vitamins-Minerals (PRESERVISION AREDS 2) CAPS Take 1 capsule by mouth 2 (two) times daily.     [provider]  potassium chloride SA (KLOR-CON) 20 MEQ tablet Take 1 tablet (20 mEq  total) by mouth once a week. Every Wednesday when you take Metolazone 01/01/20   Larey Dresser, MD  sennosides-docusate sodium (SENOKOT-S) 8.6-50 MG tablet Take 2 tablets by mouth 2 (two) times daily.    [provider]  sodium chloride (OCEAN) 0.65 % SOLN nasal spray Place 2 sprays into both nostrils 2 (two) times daily.    [provider]  torsemide (DEMADEX) 20 MG tablet Take 4 tablets (80 mg total) by mouth daily. 01/17/20   Lyda Jester M, PA-C  traMADol (ULTRAM) 50 MG tablet TAKE ONE TABLET AT BEDTIME. 10/03/19   Gayland Curry, DO    Physical Exam: Vitals:   04/13/2020 1430 04/28/2020 1515 04/29/2020 1600 04/29/2020 1645  BP: 96/63 115/84 100/60 121/88  Pulse: (!) 103 (!) 120 (!) 122 (!) 116  Resp: (!) 21 (!) 25 18 (!)  24  Temp:    97.8 F (36.6 C)  TempSrc:    Oral  SpO2: 99% 98% 98% 100%  Weight:      Height:        Constitutional: NAD, calm, comfortable Vitals:   04/13/2020 1430 04/22/2020 1515 04/19/2020 1600 04/28/2020 1645  BP: 96/63 115/84 100/60 121/88  Pulse: (!) 103 (!) 120 (!) 122 (!) 116  Resp: (!) 21 (!) 25 18 (!) 24  Temp:    97.8 F (36.6 C)  TempSrc:    Oral  SpO2: 99% 98% 98% 100%  Weight:      Height:       Eyes: PERRL, lids and conjunctivae normal ENMT: Mucous membranes are dry. Posterior pharynx clear of any exudate or lesions.Normal dentition.  Neck: normal, supple, no masses, no thyromegaly Respiratory: clear to auscultation bilaterally, no wheezing, no crackles. Normal respiratory effort. No accessory muscle use.  Cardiovascular: Irregular heart rate, no murmurs / rubs / gallops. No extremity edema. 2+ pedal pulses. No carotid bruits.  Abdomen: no tenderness, no masses palpated. No hepatosplenomegaly. Bowel sounds positive.  Musculoskeletal: no clubbing / cyanosis. No joint deformity upper and lower extremities. Good ROM, no contractures. Normal muscle tone.  Skin: no rashes, lesions, ulcers. No induration Neurologic: No facial droop,  opens eyes, moving limbs Psychiatric: Lethargic, confused.    Labs on Admission: I have personally reviewed following labs and imaging studies  CBC: Recent Labs  Lab 04/26/2020 1358 04/15/2020 1437  WBC  --  19.4*  NEUTROABS  --  15.9*  HGB 12.9 12.5  HCT 38.0 37.2  MCV  --  115.2*  PLT  --  497   Basic Metabolic Panel: Recent Labs  Lab 04/11/2020 1358 04/26/2020 1437  NA 138 138  K 2.9* 3.2*  CL  --  97*  CO2  --  25  GLUCOSE  --  110*  BUN  --  90*  CREATININE  --  1.95*  CALCIUM  --  10.4*  MG  --  2.3   GFR: Estimated Creatinine Clearance: 15 mL/min (A) (by C-G formula based on SCr of 1.95 mg/dL (H)). Liver Function Tests: Recent Labs  Lab 05/05/2020 1437  AST 21  ALT 18  ALKPHOS 83  BILITOT 1.4*  PROT 6.7  ALBUMIN 3.6   Recent Labs  Lab 04/26/2020 1437  LIPASE 34   Recent Labs  Lab 04/17/2020 1433  AMMONIA 44*   Coagulation Profile: Recent Labs  Lab 05/07/2020 1437  INR 1.7*   Cardiac Enzymes: Recent Labs  Lab 04/12/2020 1437  CKTOTAL 103   BNP (last 3 results) No results for input(s): PROBNP in the last 8760 hours. HbA1C: No results for input(s): HGBA1C in the last 72 hours. CBG: No results for input(s): GLUCAP in the last 168 hours. Lipid Profile: No results for input(s): CHOL, HDL, LDLCALC, TRIG, CHOLHDL, LDLDIRECT in the last 72 hours. Thyroid Function Tests: No results for input(s): TSH, T4TOTAL, FREET4, T3FREE, THYROIDAB in the last 72 hours. Anemia Panel: No results for input(s): VITAMINB12, FOLATE, FERRITIN, TIBC, IRON, RETICCTPCT in the last 72 hours. Urine analysis:    Component Value Date/Time   COLORURINE YELLOW 04/08/2019 0141   APPEARANCEUR CLEAR 04/08/2019 0141   LABSPEC 1.009 04/08/2019 0141   PHURINE 6.0 04/08/2019 0141   GLUCOSEU NEGATIVE 04/08/2019 0141   HGBUR NEGATIVE 04/08/2019 0141   BILIRUBINUR NEGATIVE 04/08/2019 0141   BILIRUBINUR n 11/19/2013 1502   KETONESUR NEGATIVE 04/08/2019 0141   PROTEINUR NEGATIVE  04/08/2019 0141  UROBILINOGEN 0.2 09/01/2014 2053   NITRITE NEGATIVE 04/08/2019 0141   LEUKOCYTESUR NEGATIVE 04/08/2019 0141    Radiological Exams on Admission: CT Head Wo Contrast  Result Date: 05/07/2020 CLINICAL DATA:  Mental status change, unknown cause. Additional provided: Altered mental status for approximately 2 days, dark and foul-smelling urine. EXAM: CT HEAD WITHOUT CONTRAST TECHNIQUE: Contiguous axial images were obtained from the base of the skull through the vertex without intravenous contrast. COMPARISON:  MRI 04/11/2020 head CT 03/27/2020. FINDINGS: Brain: Mild-to-moderate cerebral and cerebellar atrophy. Mild patchy and ill-defined hypoattenuation within the cerebral white matter is nonspecific, but compatible with chronic small vessel ischemic disease. There is no acute intracranial hemorrhage. No demarcated cortical infarct. No extra-axial fluid collection. No evidence of intracranial mass. No midline shift. Vascular: No hyperdense vessel.  Atherosclerotic calcifications Skull: Normal. Negative for fracture or focal suspicious osseous lesion. Sinuses/Orbits: Visualized orbits show no acute finding. Small mucous retention cysts within the right maxillary and left sphenoid sinuses. Other: Right mastoid effusion. IMPRESSION: No evidence of acute intracranial abnormality. Mild-to-moderate generalized parenchymal atrophy and mild cerebral white matter chronic small vessel ischemic disease, stable as compared to the brain MRI of 05/01/2020. Mild paranasal sinus disease as described. Right mastoid effusion. Electronically Signed   By: Kellie Simmering DO   On: 04/28/2020 15:14   CT CHEST WO CONTRAST  Result Date: 04/08/2020 CLINICAL DATA:  Altered mental status, sepsis, follow-up possible unresolved pneumonia EXAM: CT CHEST WITHOUT CONTRAST TECHNIQUE: Multidetector CT imaging of the chest was performed following the standard protocol without IV contrast. COMPARISON:  CT 07/28/2017 FINDINGS:  Cardiovascular: Suboptimal assessment the absence of contrast media. Cardiomegaly with mild mass effect upon the right by a pectus deformity of chest. No pericardial effusion. Ascending thoracic aorta is dilated to 4.3 cm returning to a more normal caliber of 2.9 cm by the distal aortic arch. Scant atherosclerotic plaque within the thoracic aorta. No gross periaortic abnormality is seen. Central pulmonary arteries are enlarged. Luminal evaluation precluded in the absence of contrast media. No major venous abnormalities are radiographically evident. Mediastinum/Nodes: No mediastinal fluid or gas. Normal thyroid gland and thoracic inlet. No acute abnormality of the trachea or esophagus. No worrisome mediastinal or axillary adenopathy. Hilar nodal evaluation is limited in the absence of intravenous contrast media though several stable calcified left hilar nodes are unchanged from comparison exam. Lungs/Pleura: Some reticular opacities, tree-in-bud nodularity and centrilobular ground-glass is present predominantly in the right upper and middle lobes, increased from priors. There is also marked pulmonary vascular congestion and redistribution with interlobular septal thickening and peribronchial cuffing. Background of low volumes and atelectasis. Some chronic bandlike scarring is noted in the right middle lobe and anterior basal segment right lower lobe with few bandlike opacities likely reflecting further subsegmental atelectasis or scarring elsewhere. A branching nodular opacity is seen in the anterior right middle near the right hemidiaphragm, measuring to 7 mm in size (8/85). Some additional scattered sub 5 mm subpleural nodules are seen elsewhere in the right lung (8/24, 43, 79) though likely part of the infectious or inflammatory process. Upper Abdomen: Punctate calcifications throughout the spleen and minimally within the liver parenchyma likely reflecting sequela of prior granulomatous disease within the abdomen.  No acute abnormalities present in the visualized portions of the upper abdomen. Musculoskeletal: Pectus deformity of the chest. Mild straightening of the normal thoracic kyphosis. Minimal anterolisthesis of T2 on T3. No acute osseous abnormality or suspicious osseous lesion. Multilevel degenerative changes are present in the imaged portions of the  spine. Additional degenerative changes the shoulders and spine. IMPRESSION: 1. Some reticular opacities, tree-in-bud nodularity and centrilobular ground-glass is present predominantly in the right upper and middle lobes, increased from priors. Findings could reflect an acute or chronic infectious or inflammatory process including atypical infection. With additional sequela of prior granulomatous disease in the chest and abdomen. 2. A branching nodular opacity in the anterior right middle lobe near the right hemidiaphragm, measuring to 7 mm in size. Some additional scattered sub 5 mm subpleural nodules are seen elsewhere in the right lung though likely part of the infectious or inflammatory process. Non-contrast chest CT at 3-6 months is recommended. If the nodules are stable at time of repeat CT, then future CT at 18-24 months (from today's scan) is considered optional for low-risk patients, but is recommended for high-risk patients. This recommendation follows the consensus statement: Guidelines for Management of Incidental Pulmonary Nodules Detected on CT Images: From the Fleischner Society 2017; Radiology 2017; 284:228-243. 3. Cardiomegaly with marked pulmonary vascular congestion and redistribution with interlobular septal thickening and peribronchial cuffing, suggestive of mild interstitial edema. 4. Ascending thoracic aortic aneurysm measuring up to 4.3 cm. Recommend annual imaging followup by CTA or MRA. This recommendation follows 2010 ACCF/AHA/AATS/ACR/ASA/SCA/SCAI/SIR/STS/SVM Guidelines for the Diagnosis and Management of Patients with Thoracic Aortic Disease.  Circulation. 2010; 121: A540-J811. Aortic aneurysm NOS (ICD10-I71.9) 5. Aortic Atherosclerosis (ICD10-I70.0). Electronically Signed   By: Lovena Le M.D.   On: 04/15/2020 16:58   DG Chest Port 1 View  Result Date: 04/20/2020 CLINICAL DATA:  Shortness of breath EXAM: PORTABLE CHEST 1 VIEW COMPARISON:  12/19/2019 FINDINGS: Mild right middle lobe scarring. No focal consolidation. No pleural effusion or pneumothorax. Stable cardiomegaly. No acute osseous abnormality. IMPRESSION: No active disease. Electronically Signed   By: Kathreen Devoid   On: 04/19/2020 14:28    EKG: Independently reviewed.  Rapid A. fib  Assessment/Plan Active Problems:   Sepsis ([place])  (please populate well all problems here in Problem List. (For example, if patient is on BP meds at home and you resume or decide to hold them, it is a problem that needs to be her. Same for CAD, COPD, HLD and so on)  Sepsis -Evidenced by fever, tachycardia and leukocytosis along with acute metabolic encephalopathy, source considered to be UTI.  Chest x-ray and CT chest does not show significant pneumonia. -Received 1.5 L of IV boluses, blood pressure on the low side, continue maintenance IV fluid rate to 100 mL/h given history of CHF. -Continue broad-spectrum antibiotics for now.  Acute metabolic encephalopathy -Secondary to sepsis, avoid sedation medications  AKI on CKD stage II -Secondary to sepsis and hypovolemia, continue IV fluid  A. fib with RVR -Secondary to hypovolemia, as needed metoprolol for now -Continue Eliquis, CT head reassuring.  Polycythemia -Continue hydroxyurea, outpatient hematology follow-up  Hypothyroidism -Check a TSH, continue Synthroid.  Hypokalemia -PO replace and recheck level in AM, Mg=2.3.  DVT prophylaxis: Eliquis Code Status: DNR Family Communication: Daughter at bedside Disposition Plan: Expect more than 2 midnight hospital stay, PT evaluation when more stable. Consults called: None Admission  status: PCU   Lequita Halt MD [facility] Pager [phone]  05/03/2020, 5:33 PM

## 2020-05-06 NOTE — Progress Notes (Signed)
Following for code sepsis 

## 2020-05-06 NOTE — ED Notes (Signed)
Placed patient on 2L [place] due to room air saturation 87%. Pt 95% on oxygen.

## 2020-05-06 NOTE — Progress Notes (Signed)
UA compatible with UTI, will D/C Vancomycin.

## 2020-05-06 NOTE — ED Triage Notes (Signed)
Pt BIBA from [facility] independent living facility for AMS for approx 2 days and dark, foul odor smelling urine. Pt is oriented to person. A-fib HR 127 BPM on monitor. Pt has no complaints at this time.

## 2020-05-06 NOTE — ED Provider Notes (Signed)
[facility] EMERGENCY DEPARTMENT Provider Note   CSN: [id number] Arrival date & time: 04/16/2020  1325     History Chief Complaint  Patient presents with  . Altered Mental Status    Sarah Mays is a 87 y.o. female.  HPI At baseline, patient lives in independent living at [facility].  Staff and the patient's daughter began to notice over the past 2 days subtle changes to the patient's mental status.  She began to seem mildly confused.  Her daughter checked in on her this morning to find that she had not taken her morning medications which was very atypical.  Patient also had not changed her fentanyl patch, she is on a very low-dose for chronic back pain, for a day over the regimented schedule.  Staff members noted that her urine seemed to be dark and malodorous with concern for possible UTI.  Patient is also found to be in rapid atrial fibrillation.  She is on Eliquis chronically for A. fib.  Patient does not vocalize any specific complaints.  She is very mildly confused.  She denies that she has any pain at this time.  She denies that she feels short of breath.  She really does not endorse any positives on review of systems.  No known falls or traumatic injuries over the past several days    Past Medical History:  Diagnosis Date  . Abnormal stress test    a. 11/2011 Ex MV: EF 80%, small, partially reversible anteroapical defect->mild ischemia vs attenuation-->med Rx.  [name] Kitchen Alopecia, unspecified   . Arthritis   . Chronic diastolic CHF (congestive heart failure) ([place])    a. 11/2013 Echo: EF 60-65%, no rwma, mild TR, PASP 67mmHg.  . Closed fracture of lower end of radius with ulna   . Deafness    Left, s/p multiple surgeries  . Essential hypertension   . Gouty arthropathy, unspecified   . History of kidney stones   . Hypothyroidism   . Insomnia, unspecified   . Macular degeneration    Left, s/p inj. tx  . Mitral valve disorders(424.0)   . Neck mass    right,  w/u including MRI negative  . PAF (paroxysmal atrial fibrillation) (HCC)    a. on amio (02/2012 mild obstruction on PFT's) and xarelto.  . Pneumonia   . Polycythemia vera(238.4)   . Pure hypercholesterolemia   . Senile osteoporosis    Reclast in the past  . Tricuspid valve disorders, specified as nonrheumatic     Patient Active Problem List   Diagnosis Date Noted  . Advanced nonexudative age-related macular degeneration of right eye without subfoveal involvement 03/18/2020  . Exudative age-related macular degeneration of left eye with active choroidal neovascularization ([place]) 09/27/2019  . Retinal hemorrhage, left 09/27/2019  . Degenerative arthritis of right knee 08/02/2019  . Advanced nonexudative age-related macular degeneration of left eye with subfoveal involvement 07/17/2019  . Exudative age-related macular degeneration of left eye with inactive choroidal neovascularization ([place]) 07/17/2019  . Exudative age-related macular degeneration of right eye with active choroidal neovascularization ([place]) 05/17/2019  . Trichiasis without entropion of right lower eyelid 05/17/2019  . Neovascular glaucoma, right eye, severe stage 05/17/2019  . Baker's cyst of knee, unspecified laterality 12/06/2018  . Anemia in neoplastic disease 04/14/2018  . Neurogenic claudication due to lumbar spinal stenosis 01/28/2018  . Hydronephrosis with ureteropelvic junction (UPJ) obstruction 08/22/2017  . DDD (degenerative disc disease), cervical 08/09/2017  . Hypercoagulable state due to atrial fibrillation ([place]) 08/09/2017  .  Presbycusis of both ears 08/03/2017  . Deficiency anemia 07/26/2017  . Epistaxis, recurrent 02/25/2017  . Pure hypercholesterolemia   . PONV (postoperative nausea and vomiting)   . PAF (paroxysmal atrial fibrillation) ([place])   . Neck mass   . Insomnia, unspecified   . History of kidney stones   . Deafness   . Closed fracture of lower end of radius with ulna   . Arthritis   . Abnormal  stress test   . Rotator cuff arthropathy, right 09/08/2016  . Trigger point of right shoulder region 05/05/2016  . Spondylosis of cervical region without myelopathy or radiculopathy 05/05/2016  . Chondrocalcinosis 05/05/2016  . Chronic pain disorder 05/05/2016  . A-fib ([place]) 01/08/2016  . Closed fracture of shaft of right femur, initial encounter ([place]) 10/25/2015  . Status post-operative repair of hip fracture 10/25/2015  . Greater trochanteric bursitis of right hip 09/29/2015  . Asymptomatic cholelithiasis 05/09/2015  . Hair loss 11/07/2014  . Bilateral leg edema 11/07/2014  . Constipation due to pain medication 11/06/2014  . Hypotension due to drugs 11/06/2014  . Chest pain 09/01/2014  . Osteoporosis 01/04/2014  . Gout 01/04/2014  . Headache 01/04/2014  . Hypothyroidism 11/19/2013  . Hypercalcemia 10/11/2013  . Insomnia 05/16/2013  . Lumbar back pain 05/16/2013  . Polycythemia vera ([place])   . Mass of right side of neck   . Senile osteoporosis   . Macular degeneration   . Sick sinus syndrome ([place]) 03/09/2012  . CAD (coronary artery disease) 12/27/2011  . Paroxysmal atrial fibrillation ([place]) 12/27/2011  . Chronic diastolic CHF (congestive heart failure) ([place]) 12/27/2011  . HTN (hypertension) 12/27/2011    Past Surgical History:  Procedure Laterality Date  . BREAST BIOPSY  06/18/1998   left  . CARDIOVERSION N/A 07/15/2017   Procedure: CARDIOVERSION;  Surgeon: Larey Dresser, MD;  Location: [facility] ENDOSCOPY;  Service: Cardiovascular;  Laterality: N/A;  . CARDIOVERSION N/A 07/21/2018   Procedure: CARDIOVERSION;  Surgeon: Larey Dresser, MD;  Location[facility] ENDOSCOPY;  Service: Cardiovascular;  Laterality: N/A;  . CARDIOVERSION N/A 10/06/2018   Procedure: CARDIOVERSION;  Surgeon: Larey Dresser, MD;  Location: [facility] ENDOSCOPY;  Service: Cardiovascular;  Laterality: N/A;  . ELECTROPHYSIOLOGIC STUDY N/A 01/08/2016   Procedure: Atrial Fibrillation Ablation;  Surgeon: Thompson Grayer, MD;   Location: [facility] [place] CV LAB;  Service: Cardiovascular;  Laterality: N/A;  . FEMUR IM NAIL Right 10/08/2015   Procedure: INTRAMEDULLARY (IM) NAIL FEMORAL RIGHT;  Surgeon: Paralee Cancel, MD;  Location: [facility] ORS;  Service: Orthopedics;  Laterality: Right;  . MASS EXCISION Right 11/26/2016   Procedure: EXCISION RIGHT LATERAL NECK MASS;  Surgeon: Jerrell Belfast, MD;  Location: [facility];  Service: ENT;  Laterality: Right;  . TEE WITHOUT CARDIOVERSION N/A 07/15/2017   Procedure: TRANSESOPHAGEAL ECHOCARDIOGRAM (TEE);  Surgeon: Larey Dresser, MD;  Location: [facility] ENDOSCOPY;  Service: Cardiovascular;  Laterality: N/A;  . TONSILLECTOMY    . TOTAL ABDOMINAL HYSTERECTOMY    . TYMPANOPLASTY Bilateral      OB History   No obstetric history on file.     Family History  Problem Relation Age of Onset  . Hypertension Father   . Heart disease Father        patient does not know details.   . Ovarian cancer Mother 27  . Cancer Sister        colon  . Heart disease Sister   . Cancer Sister   . Cancer Sister        hodgkin disease  .  Breast cancer Daughter   . Cancer Daughter        breast    Social History   Tobacco Use  . Smoking status: Former Smoker    Types: Cigarettes    Quit date: 01/19/1984    Years since quitting: 36.3  . Smokeless tobacco: Never Used  Vaping Use  . Vaping Use: Never used  Substance Use Topics  . Alcohol use: Yes    Alcohol/week: 1.0 standard drink    Types: 1 Glasses of wine per week    Comment: Every evening   . Drug use: No    Home Medications Prior to Admission medications   Medication Sig Start Date End Date Taking? Authorizing Provider  acetaminophen (TYLENOL) 325 MG tablet Take 650 mg by mouth every 4 (four) hours as needed.    [provider]  ELIQUIS 2.5 MG TABS tablet TAKE 1 TABLET BY MOUTH TWICE DAILY. 11/06/19   Larey Dresser, MD  fentaNYL ([place]) 12 MCG/HR Place one patch onto the skin every 3 days. Remove old patch 04/07/20   Reed,  Tiffany L, DO  gabapentin (NEURONTIN) 100 MG capsule Take 1 capsule (100 mg total) by mouth daily. 11/12/19   Reed, Tiffany L, DO  hydroxyurea (HYDREA) 500 MG capsule 2 capsules daily from Mondays to Fridays and 1 capsule daily on Saturdays and Sunday 09/25/19   Heath Lark, MD  levothyroxine (SYNTHROID) 100 MCG tablet TAKE 1 TABLET ONCE DAILY BEFORE BREAKFAST. 03/12/20   Reed, Tiffany L, DO  Magnesium 250 MG TABS Take 250 mg by mouth daily.     [provider]  Melatonin 10 MG TABS Take 10 mg by mouth at bedtime.    [provider]  metolazone (ZAROXOLYN) 2.5 MG tablet Take 2.5 mg by mouth once a week.    [provider]  metoprolol succinate (TOPROL-XL) 25 MG 24 hr tablet Take 12.5 mg by mouth in the morning and at bedtime.    [provider]  Multiple Vitamins-Minerals (PRESERVISION AREDS 2) CAPS Take 1 capsule by mouth 2 (two) times daily.     [provider]  potassium chloride SA (KLOR-CON) 20 MEQ tablet Take 1 tablet (20 mEq total) by mouth once a week. Every Wednesday when you take Metolazone 01/01/20   Larey Dresser, MD  sennosides-docusate sodium (SENOKOT-S) 8.6-50 MG tablet Take 2 tablets by mouth 2 (two) times daily.    [provider]  sodium chloride (OCEAN) 0.65 % SOLN nasal spray Place 2 sprays into both nostrils 2 (two) times daily.    [provider]  torsemide (DEMADEX) 20 MG tablet Take 4 tablets (80 mg total) by mouth daily. 01/17/20   Lyda Jester M, PA-C  traMADol (ULTRAM) 50 MG tablet TAKE ONE TABLET AT BEDTIME. 10/03/19   Reed, Tiffany L, DO    Allergies    Akten [lidocaine], Amlodipine, and Augmentin [amoxicillin-pot clavulanate]  Review of Systems   Review of Systems 10 systems reviewed and negative except as per HPI Physical Exam Updated Vital Signs BP 100/60   Pulse (!) 122   Temp (!) 100.9 F (38.3 C) (Rectal)   Resp 18   Ht 5\' 1"  (1.549 m)   Wt 46.8 kg   SpO2 98%   BMI 19.49 kg/m    Physical Exam Constitutional:      Comments: Patient is well-nourished well-developed.  She is not exhibiting respiratory distress at rest.  She is alert and appropriate although appears very mildly confused  HENT:  Head: Normocephalic and atraumatic.     Mouth/Throat:     Mouth: Mucous membranes are moist.     Pharynx: Oropharynx is clear.  Eyes:     Extraocular Movements: Extraocular movements intact.     Conjunctiva/sclera: Conjunctivae normal.  Cardiovascular:     Comments: Tachycardia, irregularly irregular.  No gross rub murmur gallop. Pulmonary:     Comments: Mild tachypnea.  Right lung is clear.  Left lung seems to have some expiratory wheeze and occasional crackle. Abdominal:     Comments: Abdomen soft.  Patient does not have any guarding.  She seems to have some tenderness in the lower abdomen, although she is denying it.  Musculoskeletal:        General: No swelling or tenderness. Normal range of motion.     Cervical back: Neck supple.     Right lower leg: No edema.     Left lower leg: No edema.     Comments: Patient does not have any significant peripheral edema.  The condition of her legs and feet is very good.  No skin wounds or appearance of cellulitis.  Skin:    General: Skin is warm and dry.  Neurological:     Comments: No focal motor deficits.  Patient does seem mildly confused.  She cannot recall the date or the year.  She cannot recall where she lives.  She recognizes that she should know this information but does not at this time.  She will follow commands to help sitting forward in the stretcher for exam.  She can move both lower extremities independently at request.  And elevate each lower extremity off the bed and hold although it is requiring significant effort for her     ED Results / Procedures / Treatments   Labs (all labs ordered are listed, but only abnormal results are displayed) Labs Reviewed  COMPREHENSIVE METABOLIC PANEL - Abnormal; Notable  for the following components:      Result Value   Potassium 3.2 (*)    Chloride 97 (*)    Glucose, Bld 110 (*)    BUN 90 (*)    Creatinine, Ser 1.95 (*)    Calcium 10.4 (*)    Total Bilirubin 1.4 (*)    GFR, Estimated 24 (*)    Anion gap 16 (*)    All other components within normal limits  BRAIN NATRIURETIC PEPTIDE - Abnormal; Notable for the following components:   B Natriuretic Peptide 1,137.6 (*)    All other components within normal limits  LACTIC ACID, PLASMA - Abnormal; Notable for the following components:   Lactic Acid, Venous 2.0 (*)    All other components within normal limits  CBC WITH DIFFERENTIAL/PLATELET - Abnormal; Notable for the following components:   WBC 19.4 (*)    RBC 3.23 (*)    MCV 115.2 (*)    MCH 38.7 (*)    RDW 16.6 (*)    Neutro Abs 15.9 (*)    Monocytes Absolute 1.9 (*)    All other components within normal limits  PROTIME-INR - Abnormal; Notable for the following components:   Prothrombin Time 19.0 (*)    INR 1.7 (*)    All other components within normal limits  AMMONIA - Abnormal; Notable for the following components:   Ammonia 44 (*)    All other components within normal limits  I-STAT VENOUS BLOOD GAS, ED - Abnormal; Notable for the following components:   pH, Ven 7.521 (*)    pCO2, Ven  36.3 (*)    pO2, Ven 47.0 (*)    Bicarbonate 29.7 (*)    Acid-Base Excess 7.0 (*)    Potassium 2.9 (*)    All other components within normal limits  TROPONIN I (HIGH SENSITIVITY) - Abnormal; Notable for the following components:   Troponin I (High Sensitivity) 82 (*)    All other components within normal limits  RESP PANEL BY RT-PCR (FLU A&B, COVID) ARPGX2  CULTURE, BLOOD (ROUTINE X 2)  CULTURE, BLOOD (ROUTINE X 2)  LIPASE, BLOOD  RAPID URINE DRUG SCREEN, HOSP PERFORMED  CK  MAGNESIUM  LACTIC ACID, PLASMA  URINALYSIS, ROUTINE W REFLEX MICROSCOPIC  BLOOD GAS, VENOUS  TSH  TROPONIN I (HIGH SENSITIVITY)    EKG EKG  Interpretation  Date/Time:  Tuesday May 06 2020 13:38:47 EDT Ventricular Rate:  132 PR Interval:    QRS Duration: 93 QT Interval:  342 QTC Calculation: 467 R Axis:   46 Text Interpretation: Atrial fibrillation Abnormal R-wave progression, late transition Repolarization abnormality, prob rate related similar to previous tracing with increased rate Confirmed by Charlesetta Shanks [phone]) on 04/24/2020 3:28:36 PM   Radiology CT Head Wo Contrast  Result Date: 04/14/2020 CLINICAL DATA:  Mental status change, unknown cause. Additional provided: Altered mental status for approximately 2 days, dark and foul-smelling urine. EXAM: CT HEAD WITHOUT CONTRAST TECHNIQUE: Contiguous axial images were obtained from the base of the skull through the vertex without intravenous contrast. COMPARISON:  MRI 04/11/2020 head CT 03/27/2020. FINDINGS: Brain: Mild-to-moderate cerebral and cerebellar atrophy. Mild patchy and ill-defined hypoattenuation within the cerebral white matter is nonspecific, but compatible with chronic small vessel ischemic disease. There is no acute intracranial hemorrhage. No demarcated cortical infarct. No extra-axial fluid collection. No evidence of intracranial mass. No midline shift. Vascular: No hyperdense vessel.  Atherosclerotic calcifications Skull: Normal. Negative for fracture or focal suspicious osseous lesion. Sinuses/Orbits: Visualized orbits show no acute finding. Small mucous retention cysts within the right maxillary and left sphenoid sinuses. Other: Right mastoid effusion. IMPRESSION: No evidence of acute intracranial abnormality. Mild-to-moderate generalized parenchymal atrophy and mild cerebral white matter chronic small vessel ischemic disease, stable as compared to the brain MRI of 05/01/2020. Mild paranasal sinus disease as described. Right mastoid effusion. Electronically Signed   By: Kellie Simmering DO   On: 05/01/2020 15:14   DG Chest Port 1 View  Result Date:  04/13/2020 CLINICAL DATA:  Shortness of breath EXAM: PORTABLE CHEST 1 VIEW COMPARISON:  12/19/2019 FINDINGS: Mild right middle lobe scarring. No focal consolidation. No pleural effusion or pneumothorax. Stable cardiomegaly. No acute osseous abnormality. IMPRESSION: No active disease. Electronically Signed   By: Kathreen Devoid   On: 04/30/2020 14:28    Procedures Procedures  CRITICAL CARE Performed by: Charlesetta Shanks   Total critical care time: 33 minutes  Critical care time was exclusive of separately billable procedures and treating other patients.  Critical care was necessary to treat or prevent imminent or life-threatening deterioration.  Critical care was time spent personally by me on the following activities: development of treatment plan with patient and/or surrogate as well as nursing, discussions with consultants, evaluation of patient's response to treatment, examination of patient, obtaining history from patient or surrogate, ordering and performing treatments and interventions, ordering and review of laboratory studies, ordering and review of radiographic studies, pulse oximetry and re-evaluation of patient's condition. Medications Ordered in ED Medications  lactated ringers infusion (has no administration in time range)  lactated ringers bolus 1,000 mL (0 mLs Intravenous Stopped 04/14/2020  1553)    And  lactated ringers bolus 500 mL (has no administration in time range)  vancomycin (VANCOREADY) IVPB 1000 mg/200 mL (1,000 mg Intravenous New Bag/Given 04/10/2020 1600)  ceFEPIme (MAXIPIME) 2 g in sodium chloride 0.9 % 100 mL IVPB (has no administration in time range)  vancomycin variable dose per unstable renal function (pharmacist dosing) (has no administration in time range)  metroNIDAZOLE (FLAGYL) IVPB 500 mg (0 mg Intravenous Stopped 04/09/2020 1601)  ceFEPIme (MAXIPIME) 2 g in sodium chloride 0.9 % 100 mL IVPB (0 g Intravenous Stopped 04/17/2020 1553)    ED Course  I have reviewed the  triage vital signs and the nursing notes.  Pertinent labs & imaging results that were available during my care of the patient were reviewed by me and considered in my medical decision making (see chart for details).    MDM Rules/Calculators/A&P                         Consult: Dr. Roosevelt Locks for admission.  Patient presents with altered mental status for about 2 days.  No focal symptoms but noted concern for UTI by staff members at assisted living.  Patient does have mild confusion and fever to 100.9.  She has significant leukocytosis.  Findings consistent with sepsis.  This time, sepsis protocol initiated with broad-spectrum antibiotics.  Patient does have history of congestive heart failure.  Will have risk of increasing respiratory distress.  At initial evaluation, chest x-ray is not showing vascular congestion.  She does have some wheeze on the left side of the chest but no significant crackles.  Will admit to medical service for ongoing diagnostic evaluation and treatment for sepsis of uncertain etiology. Final Clinical Impression(s) / ED Diagnoses Final diagnoses:  Sepsis with encephalopathy without septic shock, due to unspecified organism [facility])  Longstanding persistent atrial fibrillation ([place])    Rx / DC Orders ED Discharge Orders    None       Charlesetta Shanks, MD 05/07/20 1051

## 2020-05-06 NOTE — Progress Notes (Signed)
Pharmacy Antibiotic Note  Sarah Mays is a 87 y.o. female admitted on 04/27/2020 with sepsis.  Pharmacy has been consulted for vancomycin and cefepime dosing. Patient with AKI Scr 1.95 (BL ~1.1-1.3).  Plan: Vancomycin 1g IV x1, further doses based on renal function Cefepime 2g IV every 24 hours Monitor renal function, C&S, clinical status, vanc levels as indicated  Height: 5\' 1"  (154.9 cm) Weight: 46.8 kg (103 lb 2.8 oz) IBW/kg (Calculated) : 47.8  Temp (24hrs), Avg:100.9 F (38.3 C), Min:100.9 F (38.3 C), Max:100.9 F (38.3 C)  Recent Labs  Lab 05/04/2020 1432 04/20/2020 1437  WBC  --  19.4*  CREATININE  --  1.95*  LATICACIDVEN 2.0*  --     Estimated Creatinine Clearance: 15 mL/min (A) (by C-G formula based on SCr of 1.95 mg/dL (H)).    Allergies  Allergen Reactions  . Akten [Lidocaine]   . Amlodipine Swelling and Other (See Comments)    Lower extremity swelling   . Augmentin [Amoxicillin-Pot Clavulanate] Other (See Comments)    Stomach Pain/cramps Did it involve swelling of the face/tongue/throat, SOB, or low BP? No Did it involve sudden or severe rash/hives, skin peeling, or any reaction on the inside of your mouth or nose? No Did you need to seek medical attention at a hospital or doctor's office? Unknown When did it last happen?unknown If all above answers are "NO", may proceed with cephalosporin use.     Antimicrobials this admission: Vancomycin 3/29> Cefepime 3/29>  Dose adjustments this admission: N/A  Microbiology results: 3/29 Bcx: 3/29 Ucx:   Thank you for allowing pharmacy to be a part of this patient's care.  Romilda Garret, PharmD PGY1 Acute Care Pharmacy Resident 04/11/2020 3:43 PM  Please check [email or web address] for unit specific pharmacy phone numbers.

## 2020-05-07 ENCOUNTER — Inpatient Hospital Stay (HOSPITAL_COMMUNITY): Payer: Medicare Other

## 2020-05-07 DIAGNOSIS — N170 Acute kidney failure with tubular necrosis: Secondary | ICD-10-CM | POA: Diagnosis not present

## 2020-05-07 DIAGNOSIS — R6520 Severe sepsis without septic shock: Secondary | ICD-10-CM | POA: Diagnosis not present

## 2020-05-07 DIAGNOSIS — A419 Sepsis, unspecified organism: Secondary | ICD-10-CM | POA: Diagnosis not present

## 2020-05-07 LAB — BLOOD CULTURE ID PANEL (REFLEXED) - BCID2

## 2020-05-07 LAB — CBC
HCT: 34.7 % — ABNORMAL LOW (ref 36.0–46.0)
Hemoglobin: 11.7 g/dL — ABNORMAL LOW (ref 12.0–15.0)
MCH: 38.5 pg — ABNORMAL HIGH (ref 26.0–34.0)
MCHC: 33.7 g/dL (ref 30.0–36.0)
MCV: 114.1 fL — ABNORMAL HIGH (ref 80.0–100.0)
Platelets: 299 10*3/uL (ref 150–400)
RBC: 3.04 MIL/uL — ABNORMAL LOW (ref 3.87–5.11)
RDW: 16.7 % — ABNORMAL HIGH (ref 11.5–15.5)
WBC: 19 10*3/uL — ABNORMAL HIGH (ref 4.0–10.5)
nRBC: 0 % (ref 0.0–0.2)

## 2020-05-07 LAB — BLOOD GAS, VENOUS
Acid-Base Excess: 0.6 mmol/L (ref 0.0–2.0)
Bicarbonate: 25.2 mmol/L (ref 20.0–28.0)
Drawn by: 1381
O2 Saturation: 75.7 %
Patient temperature: 37
pCO2, Ven: 44 mmHg (ref 44.0–60.0)
pH, Ven: 7.376 (ref 7.250–7.430)
pO2, Ven: 44.8 mmHg (ref 32.0–45.0)

## 2020-05-07 LAB — BASIC METABOLIC PANEL
Anion gap: 13 (ref 5–15)
BUN: 88 mg/dL — ABNORMAL HIGH (ref 8–23)
CO2: 24 mmol/L (ref 22–32)
Calcium: 9.8 mg/dL (ref 8.9–10.3)
Chloride: 101 mmol/L (ref 98–111)
Creatinine, Ser: 1.75 mg/dL — ABNORMAL HIGH (ref 0.44–1.00)
GFR, Estimated: 28 mL/min — ABNORMAL LOW (ref >60)
Glucose, Bld: 113 mg/dL — ABNORMAL HIGH (ref 70–99)
Potassium: 4.3 mmol/L (ref 3.5–5.1)
Sodium: 138 mmol/L (ref 135–145)

## 2020-05-07 LAB — TSH: TSH: 0.214 u[IU]/mL — ABNORMAL LOW (ref 0.350–4.500)

## 2020-05-07 LAB — PHOSPHORUS: Phosphorus: 3.8 mg/dL (ref 2.5–4.6)

## 2020-05-07 MED ORDER — SODIUM CHLORIDE 0.9 % IV SOLN
2.0000 g | INTRAVENOUS | Status: DC
  Administered 2020-05-07 – 2020-05-08 (×2): 2 g via INTRAVENOUS
  Filled 2020-05-07 (×3): qty 20

## 2020-05-07 MED ORDER — METOPROLOL SUCCINATE ER 25 MG PO TB24
12.5000 mg | ORAL_TABLET | Freq: Two times a day (BID) | ORAL | Status: DC
  Administered 2020-05-07 – 2020-05-08 (×3): 12.5 mg via ORAL
  Filled 2020-05-07 (×3): qty 1

## 2020-05-07 MED ORDER — FENTANYL 12 MCG/HR TD PT72
1.0000 | MEDICATED_PATCH | TRANSDERMAL | Status: DC
  Administered 2020-05-07: 1 via TRANSDERMAL
  Filled 2020-05-07: qty 1

## 2020-05-07 NOTE — Evaluation (Signed)
Physical Therapy Evaluation Patient Details Name: Sarah Mays MRN: [id number] DOB: 19-Oct-1930 Today's Date: 05/07/2020   History of Present Illness  Pt is an 87 y/o female admitted 3/29 with UTI-like symptoms including fever, AMS and urine odor.  PMHx:  deafness L, gouty arthropathy, macular degeneration, valvular d/o's.  Clinical Impression  Pt admitted with/for UTI.  Pt needing minimal assist overall, with lower ability to focus, needing increased cuing..  Pt currently limited functionally due to the problems listed below.  (see problems list.)  Pt will benefit from PT to maximize function and safety to be able to get home safely with available assist.     Follow Up Recommendations Home health PT;Supervision/Assistance - 24 hour    Equipment Recommendations  None recommended by PT    Recommendations for Other Services       Precautions / Restrictions Precautions Precautions: Fall      Mobility  Bed Mobility Overal bed mobility: Needs Assistance Bed Mobility: Sit to Supine       Sit to supine: Mod assist   General bed mobility comments: pt needed light moderate assist to get LE's into bed.  Pt assisted with LE's to scoot up toward [place] in supine.    Transfers Overall transfer level: Needs assistance   Transfers: Sit to/from Stand Sit to Stand: Min assist         General transfer comment: cues for hand placement.  minimal forward and boost assist  Ambulation/Gait Ambulation/Gait assistance: Min assist;Min guard Gait Distance (Feet): 25 Feet (to the bathroom and 30 feet in a more circuitous route back to the bed.) Assistive device: Rolling walker (2 wheeled) Gait Pattern/deviations: Step-through pattern;Decreased step length - right;Decreased step length - left;Decreased stride length   Gait velocity interpretation: <1.31 ft/sec, indicative of household ambulator General Gait Details: slow, guarded, cues to stay focused on task with pt making frequent  pauses, needing cues to redirect.  Stairs            Wheelchair Mobility    Modified Rankin (Stroke Patients Only)       Balance Overall balance assessment: Needs assistance Sitting-balance support: No upper extremity supported Sitting balance-Leahy Scale: Fair     Standing balance support: No upper extremity supported;Bilateral upper extremity supported Standing balance-Leahy Scale: Fair Standing balance comment: reliant on UE's and AD in general.  Able to stand with AD and disinfect her hands with gel and no UE support.                             Pertinent Vitals/Pain Pain Assessment: Faces Faces Pain Scale: Hurts little more Pain Location: joint bain with LE MMT Pain Descriptors / Indicators: Grimacing;Guarding Pain Intervention(s): Monitored during session    Home Living Family/patient expects to be discharged to:: Assisted living               Home Equipment: Walker - 2 wheels      Prior Function Level of Independence: Needs assistance   Gait / Transfers Assistance Needed: pt reports uses the RW to go to the bathroom and dining hall without assist.           Hand Dominance        Extremity/Trunk Assessment   Upper Extremity Assessment Upper Extremity Assessment: Generalized weakness    Lower Extremity Assessment Lower Extremity Assessment: Generalized weakness       Communication   Communication: No difficulties  Cognition Arousal/Alertness: Awake/alert Behavior  During Therapy: WFL for tasks assessed/performed;Flat affect Overall Cognitive Status: Impaired/Different from baseline Area of Impairment: Attention;Safety/judgement;Awareness                   Current Attention Level: Sustained     Safety/Judgement: Decreased awareness of safety;Decreased awareness of deficits Awareness: Intellectual   General Comments: slowed responses      General Comments General comments (skin integrity, edema, etc.): Pt's  HR very tachy and sustained in the 140's up to max 157 bpm.  Pt not aware or overtly symptomatic.  SpO2 on RA  >=95%, but reapplied 1L  on leaving.    Exercises     Assessment/Plan    PT Assessment Patient needs continued PT services  PT Problem List Decreased strength;Decreased activity tolerance;Decreased balance;Decreased mobility;Decreased safety awareness       PT Treatment Interventions DME instruction;Gait training;Functional mobility training;Therapeutic activities;Balance training;Patient/family education    PT Goals (Current goals can be found in the Care Plan section)  Acute Rehab PT Goals Patient Stated Goal: pt agreed want to get back to her PLOF at "home" PT Goal Formulation: With patient Time For Goal Achievement: 05/21/20 Potential to Achieve Goals: Good    Frequency Min 3X/week   Barriers to discharge        Co-evaluation               AM-PAC PT "6 Clicks" Mobility  Outcome Measure Help needed turning from your back to your side while in a flat bed without using bedrails?: A Little Help needed moving from lying on your back to sitting on the side of a flat bed without using bedrails?: A Lot Help needed moving to and from a bed to a chair (including a wheelchair)?: A Little Help needed standing up from a chair using your arms (e.g., wheelchair or bedside chair)?: A Little Help needed to walk in hospital room?: A Little Help needed climbing 3-5 steps with a railing? : A Lot 6 Click Score: 16    End of Session Equipment Utilized During Treatment: Oxygen Activity Tolerance: Patient tolerated treatment well;Patient limited by fatigue Patient left: in bed;with call bell/phone within reach Nurse Communication: Mobility status PT Visit Diagnosis: Unsteadiness on feet (R26.81);Muscle weakness (generalized) (M62.81)    Time: 3662-9476 PT Time Calculation (min) (ACUTE ONLY): 28 min   Charges:   PT Evaluation $PT Eval Moderate Complexity: 1 Mod PT  Treatments $Gait Training: 8-22 mins        05/07/2020  Ginger Carne., PT Acute Rehabilitation Services [phone]  (pager) [phone]  (office)  Tessie Fass Kaely Hollan 05/07/2020, 11:59 AM

## 2020-05-07 NOTE — Progress Notes (Signed)
PHARMACY - PHYSICIAN COMMUNICATION CRITICAL VALUE ALERT - BLOOD CULTURE IDENTIFICATION (BCID)  Sarah Mays is an 87 y.o. female who presented to [facility] on 05/07/2020 with a chief complaint of UTI, fever  Assessment:  Pt with kleb pneumo (no resistance) in 4/4 blood cultures (suspected source -urine)  Name of physician (or Provider) Contacted: Dr. Myna Hidalgo  Current antibiotics: Cefepime  Changes to prescribed antibiotics recommended:  Narrow to Rocephin 2gm IV Q24h  Results for orders placed or performed during the hospital encounter of 05/05/2020  Blood Culture ID Panel (Reflexed) (Collected: 05/02/2020  1:39 PM)  Result Value Ref Range   Enterococcus faecalis NOT DETECTED NOT DETECTED   Enterococcus Faecium NOT DETECTED NOT DETECTED   Listeria monocytogenes NOT DETECTED NOT DETECTED   Staphylococcus species NOT DETECTED NOT DETECTED   Staphylococcus aureus (BCID) NOT DETECTED NOT DETECTED   Staphylococcus epidermidis NOT DETECTED NOT DETECTED   Staphylococcus lugdunensis NOT DETECTED NOT DETECTED   Streptococcus species NOT DETECTED NOT DETECTED   Streptococcus agalactiae NOT DETECTED NOT DETECTED   Streptococcus pneumoniae NOT DETECTED NOT DETECTED   Streptococcus pyogenes NOT DETECTED NOT DETECTED   A.calcoaceticus-baumannii NOT DETECTED NOT DETECTED   Bacteroides fragilis NOT DETECTED NOT DETECTED   Enterobacterales DETECTED (A) NOT DETECTED   Enterobacter cloacae complex NOT DETECTED NOT DETECTED   Escherichia coli NOT DETECTED NOT DETECTED   Klebsiella aerogenes NOT DETECTED NOT DETECTED   Klebsiella oxytoca NOT DETECTED NOT DETECTED   Klebsiella pneumoniae DETECTED (A) NOT DETECTED   Proteus species NOT DETECTED NOT DETECTED   Salmonella species NOT DETECTED NOT DETECTED   Serratia marcescens NOT DETECTED NOT DETECTED   Haemophilus influenzae NOT DETECTED NOT DETECTED   Neisseria meningitidis NOT DETECTED NOT DETECTED   Pseudomonas aeruginosa NOT DETECTED NOT  DETECTED   Stenotrophomonas maltophilia NOT DETECTED NOT DETECTED   Candida albicans NOT DETECTED NOT DETECTED   Candida auris NOT DETECTED NOT DETECTED   Candida glabrata NOT DETECTED NOT DETECTED   Candida krusei NOT DETECTED NOT DETECTED   Candida parapsilosis NOT DETECTED NOT DETECTED   Candida tropicalis NOT DETECTED NOT DETECTED   Cryptococcus neoformans/gattii NOT DETECTED NOT DETECTED   CTX-M ESBL NOT DETECTED NOT DETECTED   Carbapenem resistance IMP NOT DETECTED NOT DETECTED   Carbapenem resistance KPC NOT DETECTED NOT DETECTED   Carbapenem resistance NDM NOT DETECTED NOT DETECTED   Carbapenem resist OXA 48 LIKE NOT DETECTED NOT DETECTED   Carbapenem resistance VIM NOT DETECTED NOT DETECTED    Sherlon Handing, PharmD, BCPS Please see amion for complete clinical pharmacist phone list 05/07/2020  5:02 AM

## 2020-05-07 NOTE — Progress Notes (Signed)
PROGRESS NOTE    Sarah Mays  NTZ:[id number] DOB: 1930-04-09 DOA: 05/07/2020 PCP: Royal Hawthorn, NP    Brief Narrative:  Patient is 87 year old female with history of chronic diastolic heart failure, paroxysmal A. fib on Eliquis, polycythemia vera on hydroxyurea, severe pulmonary hypertension, hypothyroidism and lives in assisted living facility presented to the ER with confusion and lethargy for 2 days.  In the emergency room low-grade temperature 100.9, lactic acid of 2, WBC of 19 with left shift.  Admitted with sepsis, resuscitated and ultimately found to have Klebsiella bacteremia.   Assessment & Plan:   Active Problems:   Sepsis ([place])  Sepsis present on admission, gram-negative bacteremia suspected urinary source. Urine cultures no growth so far Blood culture with Klebsiella Initially treated with broad-spectrum antibiotics, currently hemodynamically stabilizing, narrowed to ceftriaxone that we will continue until final cultures. Rule out urinary obstruction, post void residual x3. Renal ultrasound today to rule out any hydronephrosis or obstructing stone. Discontinue IV fluids, adequately resuscitated to avoid fluid overload.  Acute metabolic encephalopathy: Secondary to sepsis.  Mental status improving.  Acute kidney injury on CKD stage II: Baseline creatinine about 1.2-1.3.  Treated with IV fluids.  We will recheck tomorrow morning.  Discontinue further fluids.  Chronic A. fib with RVR: Secondary to low-grade fever and hypovolemia.  Therapeutic on Eliquis.  Heart rate is 100-130s in A. fib.  Patient is asymptomatic.  Resume home dose of metoprolol.  Polycythemia vera: On hydroxyurea.  Hypothyroidism: TSH is appropriately suppressed.  Continue Synthroid.  Electrolyte abnormalities: Replace and monitor levels.   DVT prophylaxis: apixaban (ELIQUIS) tablet 2.5 mg Start: 04/22/2020 2200 apixaban (ELIQUIS) tablet 2.5 mg   Code Status: DNR Family Communication:  Patient's daughter on the phone Disposition Plan: Status is: Inpatient  Remains inpatient appropriate because:IV treatments appropriate due to intensity of illness or inability to take PO and Inpatient level of care appropriate due to severity of illness   Dispo: The patient is from: ALF              Anticipated d/c is to: ALF              Patient currently is not medically stable to d/c.   Difficult to place patient No         Consultants:   None  Procedures:   None  Antimicrobials:  Anti-infectives (From admission, onward)   Start     Dose/Rate Route Frequency Ordered Stop   05/07/20 1500  ceFEPIme (MAXIPIME) 2 g in sodium chloride 0.9 % 100 mL IVPB  Status:  Discontinued        2 g 200 mL/hr over 30 Minutes Intravenous Every 24 hours 04/19/2020 1543 05/07/20 0504   05/07/20 0600  cefTRIAXone (ROCEPHIN) 2 g in sodium chloride 0.9 % 100 mL IVPB        2 g 200 mL/hr over 30 Minutes Intravenous Every 24 hours 05/07/20 0504     04/24/2020 1543  vancomycin variable dose per unstable renal function (pharmacist dosing)  Status:  Discontinued         Does not apply See admin instructions 04/11/2020 1543 04/08/2020 1826   04/21/2020 1500  vancomycin (VANCOREADY) IVPB 1000 mg/200 mL        1,000 mg 200 mL/hr over 60 Minutes Intravenous  Once 04/24/2020 1437 04/19/2020 1726   05/02/2020 1500  ceFEPIme (MAXIPIME) 2 g in sodium chloride 0.9 % 100 mL IVPB        2 g 200 mL/hr over 30  Minutes Intravenous  Once 04/25/2020 1451 04/13/2020 1553   05/02/2020 1445  aztreonam (AZACTAM) 2 g in sodium chloride 0.9 % 100 mL IVPB  Status:  Discontinued        2 g 200 mL/hr over 30 Minutes Intravenous  Once 04/30/2020 1437 05/03/2020 1451   04/15/2020 1445  metroNIDAZOLE (FLAGYL) IVPB 500 mg        500 mg 100 mL/hr over 60 Minutes Intravenous  Once 04/27/2020 1437 04/20/2020 1601         Subjective: Patient seen and examined.  Poor historian.  She herself denies any complaints.  She did complain of pain on palpation  of the lower abdomen.  No other overnight events.  Telemetry shows A. fib with heart rate 100-130.  Patient denies any chest pain or palpitations.  Temperature 100 overnight.  Objective: Vitals:   05/07/20 0300 05/07/20 0800 05/07/20 0900 05/07/20 1202  BP: 97/78 (!) 114/95 (!) 133/109 (!) 122/102  Pulse: (!) 107 (!) 127 (!) 117   Resp: 16  19 20   Temp: 97.6 F (36.4 C)  98.1 F (36.7 C)   TempSrc: Oral  Oral   SpO2: 100%  100% 100%  Weight:      Height:        Intake/Output Summary (Last 24 hours) at 05/07/2020 1453 Last data filed at 04/24/2020 2053 Gross per 24 hour  Intake 1896.57 ml  Output 300 ml  Net 1596.57 ml   Filed Weights   04/22/2020 1411  Weight: 46.8 kg    Examination:  General exam: Appears calm and comfortable, not in any distress. Patient is poor historian.  She is alert oriented x2-3.  Flat affect. Respiratory system: Clear to auscultation. Respiratory effort normal. Cardiovascular system: S1 & S2 heard, irregularly irregular.  Tachycardic. Gastrointestinal system: Mild tenderness on palpation of suprapubic area.  No rigidity or guarding.. Central nervous system: Alert and oriented. No focal neurological deficits.  Moves all extremities. Extremities: Symmetric 5 x 5 power.      Data Reviewed: I have personally reviewed following labs and imaging studies  CBC: Recent Labs  Lab 04/09/2020 1358 04/30/2020 1437 05/07/20 0210  WBC  --  19.4* 19.0*  NEUTROABS  --  15.9*  --   HGB 12.9 12.5 11.7*  HCT 38.0 37.2 34.7*  MCV  --  115.2* 114.1*  PLT  --  367 811   Basic Metabolic Panel: Recent Labs  Lab 05/02/2020 1358 04/25/2020 1437 05/07/20 0210  NA 138 138 138  K 2.9* 3.2* 4.3  CL  --  97* 101  CO2  --  25 24  GLUCOSE  --  110* 113*  BUN  --  90* 88*  CREATININE  --  1.95* 1.75*  CALCIUM  --  10.4* 9.8  MG  --  2.3  --   PHOS  --   --  3.8   GFR: Estimated Creatinine Clearance: 16.7 mL/min (A) (by C-G formula based on SCr of 1.75 mg/dL  (H)). Liver Function Tests: Recent Labs  Lab 04/27/2020 1437  AST 21  ALT 18  ALKPHOS 83  BILITOT 1.4*  PROT 6.7  ALBUMIN 3.6   Recent Labs  Lab 05/07/2020 1437  LIPASE 34   Recent Labs  Lab 04/25/2020 1433  AMMONIA 44*   Coagulation Profile: Recent Labs  Lab 04/10/2020 1437  INR 1.7*   Cardiac Enzymes: Recent Labs  Lab 04/16/2020 1437  CKTOTAL 103   BNP (last 3 results) No results for input(s): PROBNP in  the last 8760 hours. HbA1C: No results for input(s): HGBA1C in the last 72 hours. CBG: No results for input(s): GLUCAP in the last 168 hours. Lipid Profile: No results for input(s): CHOL, HDL, LDLCALC, TRIG, CHOLHDL, LDLDIRECT in the last 72 hours. Thyroid Function Tests: Recent Labs    05/07/20 0210  TSH 0.214*   Anemia Panel: No results for input(s): VITAMINB12, FOLATE, FERRITIN, TIBC, IRON, RETICCTPCT in the last 72 hours. Sepsis Labs: Recent Labs  Lab 04/24/2020 1432 04/29/2020 1539  LATICACIDVEN 2.0* 2.0*    Recent Results (from the past 240 hour(s))  Culture, blood (routine x 2)     Status: None (Preliminary result)   Collection Time: 04/22/2020  1:39 PM   Specimen: BLOOD  Result Value Ref Range Status   Specimen Description BLOOD RIGHT ANTECUBITAL  Final   Special Requests   Final    BOTTLES DRAWN AEROBIC AND ANAEROBIC Blood Culture adequate volume   Culture  Setup Time   Final    IN BOTH AEROBIC AND ANAEROBIC BOTTLES GRAM NEGATIVE RODS Organism ID to follow CRITICAL RESULT CALLED TO, READ BACK BY AND VERIFIED WITH: C AMEND PHARMD 05/07/20 0453 JDW    Culture   Final    NO GROWTH < 24 HOURS Performed at [facility], [place] [address] [place], [place] [address]    Report Status PENDING  Incomplete  Blood Culture ID Panel (Reflexed)     Status: Abnormal   Collection Time: 05/04/2020  1:39 PM  Result Value Ref Range Status   Enterococcus faecalis NOT DETECTED NOT DETECTED Final   Enterococcus Faecium NOT DETECTED NOT DETECTED Final   Listeria  monocytogenes NOT DETECTED NOT DETECTED Final   Staphylococcus species NOT DETECTED NOT DETECTED Final   Staphylococcus aureus (BCID) NOT DETECTED NOT DETECTED Final   Staphylococcus epidermidis NOT DETECTED NOT DETECTED Final   Staphylococcus lugdunensis NOT DETECTED NOT DETECTED Final   Streptococcus species NOT DETECTED NOT DETECTED Final   Streptococcus agalactiae NOT DETECTED NOT DETECTED Final   Streptococcus pneumoniae NOT DETECTED NOT DETECTED Final   Streptococcus pyogenes NOT DETECTED NOT DETECTED Final   A.calcoaceticus-baumannii NOT DETECTED NOT DETECTED Final   Bacteroides fragilis NOT DETECTED NOT DETECTED Final   Enterobacterales DETECTED (A) NOT DETECTED Final    Comment: Enterobacterales represent a large order of gram negative bacteria, not a single organism. CRITICAL RESULT CALLED TO, READ BACK BY AND VERIFIED WITH: C AMEND PHARMD 05/07/20 0453 JDW    Enterobacter cloacae complex NOT DETECTED NOT DETECTED Final   Escherichia coli NOT DETECTED NOT DETECTED Final   Klebsiella aerogenes NOT DETECTED NOT DETECTED Final   Klebsiella oxytoca NOT DETECTED NOT DETECTED Final   Klebsiella pneumoniae DETECTED (A) NOT DETECTED Final    Comment: CRITICAL RESULT CALLED TO, READ BACK BY AND VERIFIED WITH: C AMEND PHARMD 05/07/20 0453 JDW    Proteus species NOT DETECTED NOT DETECTED Final   Salmonella species NOT DETECTED NOT DETECTED Final   Serratia marcescens NOT DETECTED NOT DETECTED Final   Haemophilus influenzae NOT DETECTED NOT DETECTED Final   Neisseria meningitidis NOT DETECTED NOT DETECTED Final   Pseudomonas aeruginosa NOT DETECTED NOT DETECTED Final   Stenotrophomonas maltophilia NOT DETECTED NOT DETECTED Final   Candida albicans NOT DETECTED NOT DETECTED Final   Candida auris NOT DETECTED NOT DETECTED Final   Candida glabrata NOT DETECTED NOT DETECTED Final   Candida krusei NOT DETECTED NOT DETECTED Final   Candida parapsilosis NOT DETECTED NOT DETECTED Final  Candida tropicalis NOT DETECTED NOT DETECTED Final   Cryptococcus neoformans/gattii NOT DETECTED NOT DETECTED Final   CTX-M ESBL NOT DETECTED NOT DETECTED Final   Carbapenem resistance IMP NOT DETECTED NOT DETECTED Final   Carbapenem resistance KPC NOT DETECTED NOT DETECTED Final   Carbapenem resistance NDM NOT DETECTED NOT DETECTED Final   Carbapenem resist OXA 48 LIKE NOT DETECTED NOT DETECTED Final   Carbapenem resistance VIM NOT DETECTED NOT DETECTED Final    Comment: Performed at [facility], [place] [address] [place], [place] [address]  Resp Panel by RT-PCR (Flu A&B, Covid) Nasopharyngeal Swab     Status: None   Collection Time: 04/29/2020  1:40 PM   Specimen: Nasopharyngeal Swab; Nasopharyngeal(NP) swabs in vial transport medium  Result Value Ref Range Status   SARS Coronavirus 2 by RT PCR NEGATIVE NEGATIVE Final    Comment: (NOTE) SARS-CoV-2 target nucleic acids are NOT DETECTED.  The SARS-CoV-2 RNA is generally detectable in upper respiratory specimens during the acute phase of infection. The lowest concentration of SARS-CoV-2 viral copies this assay can detect is 138 copies/mL. A negative result does not preclude SARS-Cov-2 infection and should not be used as the sole basis for treatment or other patient management decisions. A negative result may occur with  improper specimen collection/handling, submission of specimen other than nasopharyngeal swab, presence of viral mutation(s) within the areas targeted by this assay, and inadequate number of viral copies(<138 copies/mL). A negative result must be combined with clinical observations, patient history, and epidemiological information. The expected result is Negative.  Fact Sheet for Patients:  EntrepreneurPulse.com.au  Fact Sheet for Healthcare Providers:  IncredibleEmployment.be  This test is no t yet approved or cleared by the [place] FDA and  has been authorized for  detection and/or diagnosis of SARS-CoV-2 by FDA under an Emergency Use Authorization (EUA). This EUA will remain  in effect (meaning this test can be used) for the duration of the COVID-19 declaration under Section 564(b)(1) of the Act, 21 U.S.C.section 360bbb-3(b)(1), unless the authorization is terminated  or revoked sooner.       Influenza A by PCR NEGATIVE NEGATIVE Final   Influenza B by PCR NEGATIVE NEGATIVE Final    Comment: (NOTE) The Xpert Xpress SARS-CoV-2/FLU/RSV plus assay is intended as an aid in the diagnosis of influenza from Nasopharyngeal swab specimens and should not be used as a sole basis for treatment. Nasal washings and aspirates are unacceptable for Xpert Xpress SARS-CoV-2/FLU/RSV testing.  Fact Sheet for Patients: EntrepreneurPulse.com.au  Fact Sheet for Healthcare Providers: IncredibleEmployment.be  This test is not yet approved or cleared by the [place] FDA and has been authorized for detection and/or diagnosis of SARS-CoV-2 by FDA under an Emergency Use Authorization (EUA). This EUA will remain in effect (meaning this test can be used) for the duration of the COVID-19 declaration under Section 564(b)(1) of the Act, 21 U.S.C. section 360bbb-3(b)(1), unless the authorization is terminated or revoked.  Performed at [facility], [place] [address], [place], [place] [address]   Culture, blood (routine x 2)     Status: None (Preliminary result)   Collection Time: 05/07/2020  1:44 PM   Specimen: BLOOD  Result Value Ref Range Status   Specimen Description BLOOD RIGHT ANTECUBITAL  Final   Special Requests   Final    BOTTLES DRAWN AEROBIC AND ANAEROBIC Blood Culture adequate volume   Culture  Setup Time   Final    IN BOTH AEROBIC AND ANAEROBIC BOTTLES GRAM NEGATIVE  RODS CRITICAL VALUE NOTED.  VALUE IS CONSISTENT WITH PREVIOUSLY REPORTED AND CALLED VALUE.    Culture   Final    NO GROWTH < 24 HOURS Performed at  [facility], [place] [address] [place], [place] [address]    Report Status PENDING  Incomplete         Radiology Studies: CT Head Wo Contrast  Result Date: 05/02/2020 CLINICAL DATA:  Mental status change, unknown cause. Additional provided: Altered mental status for approximately 2 days, dark and foul-smelling urine. EXAM: CT HEAD WITHOUT CONTRAST TECHNIQUE: Contiguous axial images were obtained from the base of the skull through the vertex without intravenous contrast. COMPARISON:  MRI 04/11/2020 head CT 03/27/2020. FINDINGS: Brain: Mild-to-moderate cerebral and cerebellar atrophy. Mild patchy and ill-defined hypoattenuation within the cerebral white matter is nonspecific, but compatible with chronic small vessel ischemic disease. There is no acute intracranial hemorrhage. No demarcated cortical infarct. No extra-axial fluid collection. No evidence of intracranial mass. No midline shift. Vascular: No hyperdense vessel.  Atherosclerotic calcifications Skull: Normal. Negative for fracture or focal suspicious osseous lesion. Sinuses/Orbits: Visualized orbits show no acute finding. Small mucous retention cysts within the right maxillary and left sphenoid sinuses. Other: Right mastoid effusion. IMPRESSION: No evidence of acute intracranial abnormality. Mild-to-moderate generalized parenchymal atrophy and mild cerebral white matter chronic small vessel ischemic disease, stable as compared to the brain MRI of 05/01/2020. Mild paranasal sinus disease as described. Right mastoid effusion. Electronically Signed   By: Kellie Simmering DO   On: 04/18/2020 15:14   CT CHEST WO CONTRAST  Result Date: 04/09/2020 CLINICAL DATA:  Altered mental status, sepsis, follow-up possible unresolved pneumonia EXAM: CT CHEST WITHOUT CONTRAST TECHNIQUE: Multidetector CT imaging of the chest was performed following the standard protocol without IV contrast. COMPARISON:  CT 07/28/2017 FINDINGS: Cardiovascular: Suboptimal  assessment the absence of contrast media. Cardiomegaly with mild mass effect upon the right by a pectus deformity of chest. No pericardial effusion. Ascending thoracic aorta is dilated to 4.3 cm returning to a more normal caliber of 2.9 cm by the distal aortic arch. Scant atherosclerotic plaque within the thoracic aorta. No gross periaortic abnormality is seen. Central pulmonary arteries are enlarged. Luminal evaluation precluded in the absence of contrast media. No major venous abnormalities are radiographically evident. Mediastinum/Nodes: No mediastinal fluid or gas. Normal thyroid gland and thoracic inlet. No acute abnormality of the trachea or esophagus. No worrisome mediastinal or axillary adenopathy. Hilar nodal evaluation is limited in the absence of intravenous contrast media though several stable calcified left hilar nodes are unchanged from comparison exam. Lungs/Pleura: Some reticular opacities, tree-in-bud nodularity and centrilobular ground-glass is present predominantly in the right upper and middle lobes, increased from priors. There is also marked pulmonary vascular congestion and redistribution with interlobular septal thickening and peribronchial cuffing. Background of low volumes and atelectasis. Some chronic bandlike scarring is noted in the right middle lobe and anterior basal segment right lower lobe with few bandlike opacities likely reflecting further subsegmental atelectasis or scarring elsewhere. A branching nodular opacity is seen in the anterior right middle near the right hemidiaphragm, measuring to 7 mm in size (8/85). Some additional scattered sub 5 mm subpleural nodules are seen elsewhere in the right lung (8/24, 43, 79) though likely part of the infectious or inflammatory process. Upper Abdomen: Punctate calcifications throughout the spleen and minimally within the liver parenchyma likely reflecting sequela of prior granulomatous disease within the abdomen. No acute abnormalities  present in the visualized portions of the upper abdomen.  Musculoskeletal: Pectus deformity of the chest. Mild straightening of the normal thoracic kyphosis. Minimal anterolisthesis of T2 on T3. No acute osseous abnormality or suspicious osseous lesion. Multilevel degenerative changes are present in the imaged portions of the spine. Additional degenerative changes the shoulders and spine. IMPRESSION: 1. Some reticular opacities, tree-in-bud nodularity and centrilobular ground-glass is present predominantly in the right upper and middle lobes, increased from priors. Findings could reflect an acute or chronic infectious or inflammatory process including atypical infection. With additional sequela of prior granulomatous disease in the chest and abdomen. 2. A branching nodular opacity in the anterior right middle lobe near the right hemidiaphragm, measuring to 7 mm in size. Some additional scattered sub 5 mm subpleural nodules are seen elsewhere in the right lung though likely part of the infectious or inflammatory process. Non-contrast chest CT at 3-6 months is recommended. If the nodules are stable at time of repeat CT, then future CT at 18-24 months (from today's scan) is considered optional for low-risk patients, but is recommended for high-risk patients. This recommendation follows the consensus statement: Guidelines for Management of Incidental Pulmonary Nodules Detected on CT Images: From the Fleischner Society 2017; Radiology 2017; 284:228-243. 3. Cardiomegaly with marked pulmonary vascular congestion and redistribution with interlobular septal thickening and peribronchial cuffing, suggestive of mild interstitial edema. 4. Ascending thoracic aortic aneurysm measuring up to 4.3 cm. Recommend annual imaging followup by CTA or MRA. This recommendation follows 2010 ACCF/AHA/AATS/ACR/ASA/SCA/SCAI/SIR/STS/SVM Guidelines for the Diagnosis and Management of Patients with Thoracic Aortic Disease. Circulation. 2010; 121:  G921-J941. Aortic aneurysm NOS (ICD10-I71.9) 5. Aortic Atherosclerosis (ICD10-I70.0). Electronically Signed   By: Lovena Le M.D.   On: 05/04/2020 16:58   DG Chest Port 1 View  Result Date: 04/28/2020 CLINICAL DATA:  Shortness of breath EXAM: PORTABLE CHEST 1 VIEW COMPARISON:  12/19/2019 FINDINGS: Mild right middle lobe scarring. No focal consolidation. No pleural effusion or pneumothorax. Stable cardiomegaly. No acute osseous abnormality. IMPRESSION: No active disease. Electronically Signed   By: Kathreen Devoid   On: 04/20/2020 14:28        Scheduled Meds: . apixaban  2.5 mg Oral BID  . fentaNYL  1 patch Transdermal Q72H  . gabapentin  100 mg Oral Daily  . hydroxyurea  1,000 mg Oral Once per day on Mon Tue Wed Thu Fri   And  . [START ON 05/10/2020] hydroxyurea  500 mg Oral Once per day on Sun Sat  . levothyroxine  100 mcg Oral Q0600  . melatonin  10 mg Oral QHS  . metoprolol succinate  12.5 mg Oral BID  . senna-docusate  2 tablet Oral BID  . sodium chloride  2 spray Each Nare BID  . traMADol  50 mg Oral QHS   Continuous Infusions: . cefTRIAXone (ROCEPHIN)  IV 2 g (05/07/20 0646)     LOS: 1 day    Time spent: 30 minutes    Barb Merino, MD [facility] Pager [phone]

## 2020-05-08 ENCOUNTER — Inpatient Hospital Stay (HOSPITAL_COMMUNITY): Payer: Medicare Other

## 2020-05-08 DIAGNOSIS — E872 Acidosis, unspecified: Secondary | ICD-10-CM

## 2020-05-08 DIAGNOSIS — A419 Sepsis, unspecified organism: Secondary | ICD-10-CM | POA: Diagnosis not present

## 2020-05-08 DIAGNOSIS — I4811 Longstanding persistent atrial fibrillation: Secondary | ICD-10-CM | POA: Diagnosis not present

## 2020-05-08 DIAGNOSIS — R6520 Severe sepsis without septic shock: Secondary | ICD-10-CM

## 2020-05-08 DIAGNOSIS — R7881 Bacteremia: Secondary | ICD-10-CM

## 2020-05-08 DIAGNOSIS — N179 Acute kidney failure, unspecified: Secondary | ICD-10-CM

## 2020-05-08 DIAGNOSIS — G9340 Encephalopathy, unspecified: Secondary | ICD-10-CM

## 2020-05-08 LAB — CBC WITH DIFFERENTIAL/PLATELET
Abs Immature Granulocytes: 0.19 10*3/uL — ABNORMAL HIGH (ref 0.00–0.07)
Basophils Absolute: 0.1 10*3/uL (ref 0.0–0.1)
Basophils Relative: 1 %
Eosinophils Absolute: 0 10*3/uL (ref 0.0–0.5)
Eosinophils Relative: 0 %
HCT: 36 % (ref 36.0–46.0)
Hemoglobin: 11.9 g/dL — ABNORMAL LOW (ref 12.0–15.0)
Immature Granulocytes: 1 %
Lymphocytes Relative: 4 %
Lymphs Abs: 0.9 10*3/uL (ref 0.7–4.0)
MCH: 38.3 pg — ABNORMAL HIGH (ref 26.0–34.0)
MCHC: 33.1 g/dL (ref 30.0–36.0)
MCV: 115.8 fL — ABNORMAL HIGH (ref 80.0–100.0)
Monocytes Absolute: 1.9 10*3/uL — ABNORMAL HIGH (ref 0.1–1.0)
Monocytes Relative: 9 %
Neutro Abs: 17.6 10*3/uL — ABNORMAL HIGH (ref 1.7–7.7)
Neutrophils Relative %: 85 %
Platelets: 279 10*3/uL (ref 150–400)
RBC: 3.11 MIL/uL — ABNORMAL LOW (ref 3.87–5.11)
RDW: 16.7 % — ABNORMAL HIGH (ref 11.5–15.5)
WBC: 21.3 10*3/uL — ABNORMAL HIGH (ref 4.0–10.5)
nRBC: 1.4 % — ABNORMAL HIGH (ref 0.0–0.2)

## 2020-05-08 LAB — BLOOD GAS, ARTERIAL
Acid-base deficit: 20 mmol/L — ABNORMAL HIGH (ref 0.0–2.0)
Bicarbonate: 9.2 mmol/L — ABNORMAL LOW (ref 20.0–28.0)
FIO2: 100
O2 Saturation: 84.4 %
Patient temperature: 36.5
pCO2 arterial: 40.2 mmHg (ref 32.0–48.0)
pH, Arterial: 6.986 — CL (ref 7.350–7.450)
pO2, Arterial: 79.2 mmHg — ABNORMAL LOW (ref 83.0–108.0)

## 2020-05-08 LAB — LACTIC ACID, PLASMA: Lactic Acid, Venous: 10.4 mmol/L (ref 0.5–1.9)

## 2020-05-08 LAB — BASIC METABOLIC PANEL
Anion gap: 16 — ABNORMAL HIGH (ref 5–15)
BUN: 105 mg/dL — ABNORMAL HIGH (ref 8–23)
CO2: 20 mmol/L — ABNORMAL LOW (ref 22–32)
Calcium: 9.7 mg/dL (ref 8.9–10.3)
Chloride: 98 mmol/L (ref 98–111)
Creatinine, Ser: 1.98 mg/dL — ABNORMAL HIGH (ref 0.44–1.00)
GFR, Estimated: 24 mL/min — ABNORMAL LOW (ref >60)
Glucose, Bld: 97 mg/dL (ref 70–99)
Potassium: 4.9 mmol/L (ref 3.5–5.1)
Sodium: 134 mmol/L — ABNORMAL LOW (ref 135–145)

## 2020-05-08 LAB — MAGNESIUM: Magnesium: 2.6 mg/dL — ABNORMAL HIGH (ref 1.7–2.4)

## 2020-05-08 LAB — GLUCOSE, CAPILLARY
Glucose-Capillary: 47 mg/dL — ABNORMAL LOW (ref 70–99)
Glucose-Capillary: 81 mg/dL (ref 70–99)

## 2020-05-08 SURGERY — CYSTOSCOPY, WITH RETROGRADE PYELOGRAM AND URETERAL STENT INSERTION
Anesthesia: General | Laterality: Right

## 2020-05-08 MED ORDER — DEXTROSE 50 % IV SOLN: 12.5000 g | INTRAVENOUS | Status: AC

## 2020-05-08 MED ORDER — DEXTROSE 50 % IV SOLN
INTRAVENOUS | Status: AC
  Filled 2020-05-08: qty 50

## 2020-05-08 MED ORDER — LACTATED RINGERS IV BOLUS
2000.0000 mL | Freq: Once | INTRAVENOUS | Status: AC
  Administered 2020-05-08: 2000 mL via INTRAVENOUS

## 2020-05-08 MED ORDER — LACTATED RINGERS IV SOLN: INTRAVENOUS | Status: DC

## 2020-05-08 MED ORDER — LACTATED RINGERS IV BOLUS
500.0000 mL | Freq: Once | INTRAVENOUS | Status: AC
  Administered 2020-05-08: 500 mL via INTRAVENOUS

## 2020-05-08 MED ORDER — NALOXONE HCL 0.4 MG/ML IJ SOLN
0.4000 mg | Freq: Once | INTRAMUSCULAR | Status: AC
  Administered 2020-05-08: 0.4 mg via INTRAVENOUS

## 2020-05-08 MED ORDER — NALOXONE HCL 0.4 MG/ML IJ SOLN
INTRAMUSCULAR | Status: AC
  Filled 2020-05-08: qty 1

## 2020-05-08 MED ORDER — MORPHINE SULFATE (PF) 2 MG/ML IV SOLN
1.0000 mg | INTRAVENOUS | Status: DC | PRN
  Administered 2020-05-08: 2 mg via INTRAVENOUS
  Filled 2020-05-08: qty 1

## 2020-05-09 LAB — CULTURE, BLOOD (ROUTINE X 2)
Special Requests: ADEQUATE
Special Requests: ADEQUATE

## 2020-05-09 NOTE — Progress Notes (Signed)
Time of death 1229, pronounced by Olen Pel RN and Clyde Canterbury RN.

## 2020-05-09 NOTE — Code Documentation (Cosign Needed Addendum)
  Patient Name: Sarah Mays   MRN: [id number]   Date of Birth/ Sex: August 01, 1930 , female      Admission Date: 05/02/2020  Attending Provider: Barb Merino, MD  Primary Diagnosis: Sepsis   Indication: Pt was in her usual state of health until this AM, when she was noted to be unresponsive and hypoglycemic (46). Code blue was subsequently called. At the time of arrival on scene, ACLS protocol was underway.   Technical Description:  - CPR performance duration:  one minute  - Was defibrillation or cardioversion used? No   - Was external pacer placed? No  - Was patient intubated pre/post CPR? No   Medications Administered: Y = Yes; Blank = No Amiodarone    Atropine    Calcium    Epinephrine    Lidocaine    Magnesium    Norepinephrine    Phenylephrine    Sodium bicarbonate    Vasopressin     Post CPR evaluation:  - Final Status - Was patient successfully resuscitated ? Yes - What is current rhythm? Sinus tachycardia - What is current hemodynamic status? Stable  Miscellaneous Information:  - Labs sent, including: None  - Primary team notified?  Yes  - Family Notified? Yes  - Additional notes/ transfer status: IMTS code team responded to code blue. Patient was reportedly found to be unresponsive without pulse and hypoglycemic to 46. Patient received approximately one minute of CPR per nursing. She had ROSC and was able to communicate minimally to staff. Primary team at bedside who resumed care of the patient.     Cato Mulligan, MD  Jun 06, 2020, 12:01 PM

## 2020-05-09 NOTE — Progress Notes (Signed)
Page sent to Olevia Bowens, MD regarding patients BP 84/70 (74) and HR 110-130.  New orders: 500cc Bolus Lactated Ringers

## 2020-05-09 NOTE — Progress Notes (Signed)
[facility] MD notified of critical ABG results.  Results for ODDIE, KUHLMANN (MRN [id number]) as of 2020-05-13 11:42  Ref. Range May 13, 2020 11:06  Sample type Unknown ARTERIAL DRAW  FIO2 Unknown 100.00  pH, Arterial Latest Ref Range: 7.350 - 7.450  6.986 (LL)  pCO2 arterial Latest Ref Range: 32.0 - 48.0 mmHg 40.2  pO2, Arterial Latest Ref Range: 83.0 - 108.0 mmHg 79.2 (L)  Acid-base deficit Latest Ref Range: 0.0 - 2.0 mmol/L 20.0 (H)  Bicarbonate Latest Ref Range: 20.0 - 28.0 mmol/L 9.2 (L)  O2 Saturation Latest Units: % 84.4  Patient temperature Unknown 36.5  Collection site Unknown RIGHT BRACHIAL  Allens test (pass/fail) Latest Ref Range: PASS  BRACHIAL ARTERY (A)

## 2020-05-09 NOTE — Progress Notes (Signed)
Fentanyl patch removed from right chest, Bps very soft 91/66, reports no pain at this time, will hold off on placing a new patch.

## 2020-05-09 NOTE — Progress Notes (Signed)
Multiple rapid responses for severe sepsis , unresponsive and hypoglycemia. Daughter at the bedside.  Confirmed no CPR Wants to try conservative measures for now, not ready for comfort care  PCCM consulted to give their opinion if she can benefit with vasopressors and BIPAP.   Full note to follow

## 2020-05-09 NOTE — Progress Notes (Addendum)
Consulted regarding chronic severe right hydronephrosis in setting of UTI with bacteremia and sepsis. Hydro unchanged from CT 12/17/2019. Given sepsis, she may benefit from GU decompression with stent or PCN. I have posted her for cysto, RPG, R stent placement however multiple add-ons on the schedule. Dr. Sloan Leiter also advised that she is not a candidate for anesthesia at this point. She may benefit from R PCN; however, on eliquis for afib for which she received today. I am available for stenting when patient is appropriate to go to OR under anesthesia.  Full consult note to follow.  Matt R. [facility]  Pager: [phone]

## 2020-05-09 NOTE — Progress Notes (Signed)
   06/07/2020 1218  Clinical Encounter Type  Visited With Patient and family together  Visit Type Death  Referral From Nurse  Consult/Referral To Chaplain  Spiritual Encounters  Spiritual Needs Prayer;Emotional;Grief support  Stress Factors  Family Stress Factors Loss;Major life changes  Chaplain received a call from Nurse Orvil Feil that Sarah Mays daughter was at bedside and very teary.  Upon this call I was with another patient and as I was approaching Sarah Mays room [place] meet me and said Sarah Mays has come to end of life.  As I entered the room Sarah Mays was at bedside. Offered Love, Support and Comfort.    Chaplain Radwan Cowley [place]  [phone]

## 2020-05-09 NOTE — Progress Notes (Signed)
NT found patient with blue lips and unresponsive. Code Blue was called and cancelled after DNR was confirmed. CBG 46 was treated with D5. MD called and at bedside. Daughter called and updated.  Clyde Canterbury, RN

## 2020-05-09 NOTE — Consult Note (Signed)
NAME:  Sarah Mays, MRN:  [id number], DOB:  Sep 19, 1930, LOS: 2 ADMISSION DATE:  04/17/2020, CONSULTATION DATE:  06-07-2020 REFERRING MD:  TRH, CHIEF COMPLAINT:  Hypotension, sepsis    History of Present Illness:  87yo female with hx chronic dCHF, PAF on eliquis, polychythemia, severe pulm HTN who initially presented 3/29 from assisted living with lethargy. She was ultimately found to have sepsis r/t klebsiella bacteremia suspected r/t urinary source. She was admitted and treated with IV fluids, IV abx. Course c/b AFib with RVR and AMS r/t sepsis.  She was improving slowly but on 3/31 developed progressive hypotension, worsening AMS and PCCM consulted for ?ICU tx for pressors.  Note pt DNR.   After discussion with daughter outside of the room and seeing pt rapidly decline in front of [place] (worsened air hunger, mottling, cyanosis within 10 minutes of my arrival), decision was made to continue full DNR and consider comfort measures if she required multiple doses of morphine for air hunger.  Shortly after my evaluation and discussion with daughter, pt expired at 1.  Pertinent  Medical History   has a past medical history of Abnormal stress test, Alopecia, unspecified, Arthritis, Chronic diastolic CHF (congestive heart failure) ([place]), Closed fracture of lower end of radius with ulna, Deafness, Essential hypertension, Gouty arthropathy, unspecified, History of kidney stones, Hypothyroidism, Insomnia, unspecified, Macular degeneration, Mitral valve disorders(424.0), Neck mass, PAF (paroxysmal atrial fibrillation) ([place]), Pneumonia, Polycythemia vera(238.4), Pure hypercholesterolemia, Senile osteoporosis, and Tricuspid valve disorders, specified as nonrheumatic.   Significant Hospital Events: Including procedures, antibiotic start and stop dates in addition to other pertinent events   Vanc 3/29>>3/30 Cefepime 3/29>>3/30 Ceftriaxone 3/30>>>  BC x 2 3/29>>> Klebsiella>>>  Interim History /  Subjective:  During my evaluation, pt had eyes open but not following commands.  Skin mottled throughout, cool extremities.  Pt unfortunately expired shortly after my evaluation.  Objective   Blood pressure 103/84, pulse 88, temperature 98 F (36.7 C), temperature source Oral, resp. rate 20, height 5\' 1"  (1.549 m), weight 46.8 kg, SpO2 (!) 59 %.       No intake or output data in the 24 hours ending 06-07-20 1139 Filed Weights   05/07/2020 1411  Weight: 46.8 kg    Examination: General: Elderly female, critically ill. Neuro: Eyes open but not following commands. HEENT: [place]/AT. Sclerae anicteric. Lips cyanotic. Cardiovascular: IRIR, tachy, no M/R/G.  Lungs: Respirations shallow and labored.  Rhonchi bilaterally. Abdomen: BS x 4, soft, NT/ND.  Musculoskeletal: No gross deformities, no edema.  Skin: Cool extremities, mottled throughout.   Resolved Hospital Problem list     Assessment & Plan:   Septic shock - 2/2 Klebsiella bacteremia presumed in setting UTI. A.fib with RVR. Suspected cardiogenic shock. Acute metabolic encephalopathy. AKI. Lactic acidosis.  Discussion: PCCM called for consideration vasopressors.  I evaluated pt at bedside at roughly 1130.  Long discussion had with pt's daughter outside the room.  I recommended no vasopressors or aggressive measures given her multiorgan failure, advanced age, frail state.  Within 10 - 15 minutes, I re-examined pt and she was clearly worse with worsened respiratory effort, worsened mottling, cooler extremities. At this point, I informed daughter that pt was clearly suffering.  Daughter felt the same way and agreed that PRN morphine would be beneficial given her air hunger.  Daughter in agreement to transition to comfort care if pt got to the point where she was requiring multiple doses of morphine or looked to be in distress / suffering. Emotional support offered  to pt.  I informed her that she should call her brother (which she  attempted to do multiple times) as I did not feel that pt would survive the day.  I contacted primary team and informed them of discussion with daughter and updated them on plan of care.  I returned to complete this consult note at 1300 and upon chart review, noticed that pt had expired at 1229.    Labs   CBC: Recent Labs  Lab 04/12/2020 1358 04/12/2020 1437 05/07/20 0210 05/18/20 0109  WBC  --  19.4* 19.0* 21.3*  NEUTROABS  --  15.9*  --  17.6*  HGB 12.9 12.5 11.7* 11.9*  HCT 38.0 37.2 34.7* 36.0  MCV  --  115.2* 114.1* 115.8*  PLT  --  367 299 387    Basic Metabolic Panel: Recent Labs  Lab 05/05/2020 1358 04/13/2020 1437 05/07/20 0210 05/18/20 0109  NA 138 138 138 134*  K 2.9* 3.2* 4.3 4.9  CL  --  97* 101 98  CO2  --  25 24 20*  GLUCOSE  --  110* 113* 97  BUN  --  90* 88* 105*  CREATININE  --  1.95* 1.75* 1.98*  CALCIUM  --  10.4* 9.8 9.7  MG  --  2.3  --  2.6*  PHOS  --   --  3.8  --    GFR: Estimated Creatinine Clearance: 14.8 mL/min (A) (by C-G formula based on SCr of 1.98 mg/dL (H)). Recent Labs  Lab 04/16/2020 1432 05/07/2020 1437 05/07/2020 1539 05/07/20 0210 05-18-20 0109 May 18, 2020 0927  WBC  --  19.4*  --  19.0* 21.3*  --   LATICACIDVEN 2.0*  --  2.0*  --   --  10.4*    Liver Function Tests: Recent Labs  Lab 04/14/2020 1437  AST 21  ALT 18  ALKPHOS 83  BILITOT 1.4*  PROT 6.7  ALBUMIN 3.6   Recent Labs  Lab 04/15/2020 1437  LIPASE 34   Recent Labs  Lab 04/14/2020 1433  AMMONIA 44*    ABG    Component Value Date/Time   PHART 6.986 (LL) 2020-05-18 1106   PCO2ART 40.2 05/18/2020 1106   PO2ART 79.2 (L) May 18, 2020 1106   HCO3 9.2 (L) 18-May-2020 1106   TCO2 31 04/12/2020 1358   ACIDBASEDEF 20.0 (H) 05/18/20 1106   O2SAT 84.4 2020/05/18 1106     Coagulation Profile: Recent Labs  Lab 04/21/2020 1437  INR 1.7*    Cardiac Enzymes: Recent Labs  Lab 05/07/2020 1437  CKTOTAL 103    HbA1C: No results found for: HGBA1C  CBG: Recent Labs   Lab 2020-05-18 1016 05/18/2020 1026  GLUCAP 47* 81    Review of Systems:   As per HPI - All other systems reviewed and were neg.    Past Medical History:  She,  has a past medical history of Abnormal stress test, Alopecia, unspecified, Arthritis, Chronic diastolic CHF (congestive heart failure) ([place]), Closed fracture of lower end of radius with ulna, Deafness, Essential hypertension, Gouty arthropathy, unspecified, History of kidney stones, Hypothyroidism, Insomnia, unspecified, Macular degeneration, Mitral valve disorders(424.0), Neck mass, PAF (paroxysmal atrial fibrillation) ([place]), Pneumonia, Polycythemia vera(238.4), Pure hypercholesterolemia, Senile osteoporosis, and Tricuspid valve disorders, specified as nonrheumatic.   Surgical History:   Past Surgical History:  Procedure Laterality Date  . BREAST BIOPSY  06/18/1998   left  . CARDIOVERSION N/A 07/15/2017   Procedure: CARDIOVERSION;  Surgeon: Larey Dresser, MD;  [facility] ENDOSCOPY;  Service: Cardiovascular;  Laterality: N/A;  . CARDIOVERSION N/A 07/21/2018   Procedure: CARDIOVERSION;  Surgeon: Larey Dresser, MD;  Location: [facility] ENDOSCOPY;  Service: Cardiovascular;  Laterality: N/A;  . CARDIOVERSION N/A 10/06/2018   Procedure: CARDIOVERSION;  Surgeon: Larey Dresser, MD;  Location: [facility] ENDOSCOPY;  Service: Cardiovascular;  Laterality: N/A;  . ELECTROPHYSIOLOGIC STUDY N/A 01/08/2016   Procedure: Atrial Fibrillation Ablation;  Surgeon: Thompson Grayer, MD;  Location: [place] CV LAB;  Service: Cardiovascular;  Laterality: N/A;  . FEMUR IM NAIL Right 10/08/2015   Procedure: INTRAMEDULLARY (IM) NAIL FEMORAL RIGHT;  Surgeon: Paralee Cancel, MD;  Location: [facility] ORS;  Service: Orthopedics;  Laterality: Right;  . MASS EXCISION Right 11/26/2016   Procedure: EXCISION RIGHT LATERAL NECK MASS;  Surgeon: Jerrell Belfast, MD;  Location: [place];  Service: ENT;  Laterality: Right;  . TEE WITHOUT CARDIOVERSION N/A 07/15/2017   Procedure:  TRANSESOPHAGEAL ECHOCARDIOGRAM (TEE);  Surgeon: Larey Dresser, MD;  Location: [facility] ENDOSCOPY;  Service: Cardiovascular;  Laterality: N/A;  . TONSILLECTOMY    . TOTAL ABDOMINAL HYSTERECTOMY    . TYMPANOPLASTY Bilateral      Social History:   reports that she quit smoking about 36 years ago. Her smoking use included cigarettes. She has never used smokeless tobacco. She reports current alcohol use of about 1.0 standard drink of alcohol per week. She reports that she does not use drugs.   Family History:  Her family history includes Breast cancer in her daughter; Cancer in her daughter, sister, sister, and sister; Heart disease in her father and sister; Hypertension in her father; Ovarian cancer (age of onset: 66) in her mother.   Allergies Allergies  Allergen Reactions  . Akten [Lidocaine]   . Amlodipine Swelling and Other (See Comments)    Lower extremity swelling   . Augmentin [Amoxicillin-Pot Clavulanate] Other (See Comments)    Stomach Pain/cramps; has tolerated cefazolin, ceftriaxone, cefepime.      Home Medications  Prior to Admission medications   Medication Sig Start Date End Date Taking? Authorizing Provider  acetaminophen (TYLENOL) 325 MG tablet Take 650 mg by mouth every 4 (four) hours as needed.   Yes [provider]  ELIQUIS 2.5 MG TABS tablet TAKE 1 TABLET BY MOUTH TWICE DAILY. 11/06/19  Yes Larey Dresser, MD  fentaNYL ([place]) 12 MCG/HR Place one patch onto the skin every 3 days. Remove old patch 04/07/20  Yes Reed, Tiffany L, DO  gabapentin (NEURONTIN) 100 MG capsule Take 1 capsule (100 mg total) by mouth daily. 11/12/19  Yes Reed, Tiffany L, DO  hydroxyurea (HYDREA) 500 MG capsule 2 capsules daily from Mondays to Fridays and 1 capsule daily on Saturdays and Sunday Patient taking differently: Take 500 mg by mouth See admin instructions. 2 capsules by mouth daily on Mondays - Fridays and 1 capsule by mouth on Saturdays and Sunday 09/25/19  Yes Gorsuch, Ni, MD   levothyroxine (SYNTHROID) 100 MCG tablet TAKE 1 TABLET ONCE DAILY BEFORE BREAKFAST. Patient taking differently: Take 100 mcg by mouth daily before breakfast. TAKE 1 TABLET ONCE DAILY BEFORE BREAKFAST. 03/12/20  Yes Reed, Tiffany L, DO  Magnesium 250 MG TABS Take 250 mg by mouth daily.    Yes [provider]  Melatonin 10 MG TABS Take 10 mg by mouth at bedtime.   Yes [provider]  metolazone (ZAROXOLYN) 2.5 MG tablet Take 2.5 mg by mouth once a week.   Yes [provider]  metoprolol succinate (TOPROL-XL) 25 MG 24 hr tablet Take 12.5 mg by  mouth in the morning and at bedtime.   Yes [provider]  Multiple Vitamins-Minerals (PRESERVISION AREDS 2) CAPS Take 1 capsule by mouth 2 (two) times daily.    Yes [provider]  potassium chloride SA (KLOR-CON) 20 MEQ tablet Take 1 tablet (20 mEq total) by mouth once a week. Every Wednesday when you take Metolazone 01/01/20  Yes Larey Dresser, MD  sennosides-docusate sodium (SENOKOT-S) 8.6-50 MG tablet Take 2 tablets by mouth 2 (two) times daily.   Yes [provider]  sodium chloride (OCEAN) 0.65 % SOLN nasal spray Place 2 sprays into both nostrils 2 (two) times daily.   Yes [provider]  torsemide (DEMADEX) 20 MG tablet Take 4 tablets (80 mg total) by mouth daily. 01/17/20  Yes Simmons, Brittainy M, PA-C  traMADol (ULTRAM) 50 MG tablet TAKE ONE TABLET AT BEDTIME. Patient taking differently: Take 50 mg by mouth at bedtime. 10/03/19  Yes Reed, Tiffany L, DO     CC time: 35 min.   Montey Hora, [facility] For pager details, please see AMION or use Epic chat  After 1900, please call [facility] for cross coverage needs May 18, 2020, 1:13 PM

## 2020-05-09 NOTE — Progress Notes (Addendum)
This chaplain continued family spiritual care after Chaplain Vincella introduced the chaplain to the Pt. and Pt. daughter Sarah Mays.   The chaplain understands the Pt. death is unexpected.  The chaplain offered reflective listening as Sarah Mays and Dr. Jeanell Sparrow talked about next steps and coordinating family presence at the bedside.    Sarah Mays is grateful for Chaplain Vincella's prayer and presence.  This chaplain offered F/U spiritual care as needed.  *[id number] Chaplain F/U. Family no longer present

## 2020-05-09 NOTE — Progress Notes (Signed)
Paged Dr.Ghimire regarding pts low blood pressures, orders received for 2L LR bolus and provider to come to bedside.  Will CTM

## 2020-05-09 NOTE — Death Summary Note (Signed)
DEATH SUMMARY   Patient Details  Name: Sarah Mays MRN: [id number] DOB: May 25, 1930  Admission/Discharge Information   Admit Date:  2020/05/28  Date of Death:  05-30-20  Time of Death:  May 20, 1227  Length of Stay: 2  Referring Physician: Royal Hawthorn, NP   Reason(s) for Hospitalization  infection   Diagnoses  Preliminary cause of death:  Secondary Diagnoses (including complications and co-morbidities):  Principal Problem:   Severe sepsis with septic shock ([place]) Active Problems:   AKI (acute kidney injury) ([place])   Bacteremia   Lactic acidosis   Encephalopathy acute   Brief Hospital Course (including significant findings, care, treatment, and services provided and events leading to death)  Sarah Mays is a 87 y.o. year old female who has a history of chronic diastolic heart failure, paroxysmal A. fib on Eliquis and metoprolol, polycythemia vera on hydroxyurea, severe pulmonary hypertension, hypothyroidism who was living in an independent living facility presented to the ER with confusion and being lethargic for 2 days.  In the emergency room, she was found to have low-grade temperature 100.9, lactic acid of 2, WBC count of 19 with left shift.  She was admitted with sepsis, resuscitated and ultimately found to have Klebsiella bacteremia.  2020-05-28, admission to the hospital with sepsis, initially resuscitated and responded to IV fluid and antibiotics narrowed to ceftriaxone to cover for Klebsiella bacteremia. Renal ultrasound was consistent with severe right-sided hydronephrosis, also present on previous CT scans that was thought to be chronic.  3/30, patient was appropriately responding, mental status was improving all day.   31-May-2022, overnight, patient was hypotensive with intermittent response to IV fluid.  Early morning hours, she was noted to be more lethargic and confused along with hypotension and not making urine. Patient was attended multiple times, aggressive  resuscitation started with more IV fluids, Foley catheter was placed.  Case was discussed with urology for cystoscopy and or percutaneous nephrostomy. Despite aggressive resuscitation, patient continued to deteriorate very quickly. Patient had clear documentation about DNR, no CPR and no intubation. Critical care was called for help with possible vasopressor infusion to help with perfusion and improve blood pressures. During resuscitation and course of events, her clinical condition rapidly deteriorated.  She started developing air hunger, hypoxia and cyanosis.  Patient became uncomfortable. After detailed discussion with patient's daughter at the bedside, a comfort care approach was discussed, however while patient was actively getting resuscitated, she had rather rapid deterioration and she expired at 12:29 PM with daughter at the bedside and multiple providers at the bedside.  Severe sepsis with septic shock Gram negative bacteremia Paroxysmal Afib Diastolic heart failure     Pertinent Labs and Studies  Significant Diagnostic Studies CT Head Wo Contrast  Result Date: 2020-05-28 CLINICAL DATA:  Mental status change, unknown cause. Additional provided: Altered mental status for approximately 2 days, dark and foul-smelling urine. EXAM: CT HEAD WITHOUT CONTRAST TECHNIQUE: Contiguous axial images were obtained from the base of the skull through the vertex without intravenous contrast. COMPARISON:  MRI 04/11/2020 head CT 03/27/2020. FINDINGS: Brain: Mild-to-moderate cerebral and cerebellar atrophy. Mild patchy and ill-defined hypoattenuation within the cerebral white matter is nonspecific, but compatible with chronic small vessel ischemic disease. There is no acute intracranial hemorrhage. No demarcated cortical infarct. No extra-axial fluid collection. No evidence of intracranial mass. No midline shift. Vascular: No hyperdense vessel.  Atherosclerotic calcifications Skull: Normal. Negative for  fracture or focal suspicious osseous lesion. Sinuses/Orbits: Visualized orbits show no acute finding. Small mucous retention cysts within  the right maxillary and left sphenoid sinuses. Other: Right mastoid effusion. IMPRESSION: No evidence of acute intracranial abnormality. Mild-to-moderate generalized parenchymal atrophy and mild cerebral white matter chronic small vessel ischemic disease, stable as compared to the brain MRI of 05/01/2020. Mild paranasal sinus disease as described. Right mastoid effusion. Electronically Signed   By: Kellie Simmering DO   On: 04/10/2020 15:14   CT CHEST WO CONTRAST  Result Date: 05/04/2020 CLINICAL DATA:  Altered mental status, sepsis, follow-up possible unresolved pneumonia EXAM: CT CHEST WITHOUT CONTRAST TECHNIQUE: Multidetector CT imaging of the chest was performed following the standard protocol without IV contrast. COMPARISON:  CT 07/28/2017 FINDINGS: Cardiovascular: Suboptimal assessment the absence of contrast media. Cardiomegaly with mild mass effect upon the right by a pectus deformity of chest. No pericardial effusion. Ascending thoracic aorta is dilated to 4.3 cm returning to a more normal caliber of 2.9 cm by the distal aortic arch. Scant atherosclerotic plaque within the thoracic aorta. No gross periaortic abnormality is seen. Central pulmonary arteries are enlarged. Luminal evaluation precluded in the absence of contrast media. No major venous abnormalities are radiographically evident. Mediastinum/Nodes: No mediastinal fluid or gas. Normal thyroid gland and thoracic inlet. No acute abnormality of the trachea or esophagus. No worrisome mediastinal or axillary adenopathy. Hilar nodal evaluation is limited in the absence of intravenous contrast media though several stable calcified left hilar nodes are unchanged from comparison exam. Lungs/Pleura: Some reticular opacities, tree-in-bud nodularity and centrilobular ground-glass is present predominantly in the right upper  and middle lobes, increased from priors. There is also marked pulmonary vascular congestion and redistribution with interlobular septal thickening and peribronchial cuffing. Background of low volumes and atelectasis. Some chronic bandlike scarring is noted in the right middle lobe and anterior basal segment right lower lobe with few bandlike opacities likely reflecting further subsegmental atelectasis or scarring elsewhere. A branching nodular opacity is seen in the anterior right middle near the right hemidiaphragm, measuring to 7 mm in size (8/85). Some additional scattered sub 5 mm subpleural nodules are seen elsewhere in the right lung (8/24, 43, 79) though likely part of the infectious or inflammatory process. Upper Abdomen: Punctate calcifications throughout the spleen and minimally within the liver parenchyma likely reflecting sequela of prior granulomatous disease within the abdomen. No acute abnormalities present in the visualized portions of the upper abdomen. Musculoskeletal: Pectus deformity of the chest. Mild straightening of the normal thoracic kyphosis. Minimal anterolisthesis of T2 on T3. No acute osseous abnormality or suspicious osseous lesion. Multilevel degenerative changes are present in the imaged portions of the spine. Additional degenerative changes the shoulders and spine. IMPRESSION: 1. Some reticular opacities, tree-in-bud nodularity and centrilobular ground-glass is present predominantly in the right upper and middle lobes, increased from priors. Findings could reflect an acute or chronic infectious or inflammatory process including atypical infection. With additional sequela of prior granulomatous disease in the chest and abdomen. 2. A branching nodular opacity in the anterior right middle lobe near the right hemidiaphragm, measuring to 7 mm in size. Some additional scattered sub 5 mm subpleural nodules are seen elsewhere in the right lung though likely part of the infectious or  inflammatory process. Non-contrast chest CT at 3-6 months is recommended. If the nodules are stable at time of repeat CT, then future CT at 18-24 months (from today's scan) is considered optional for low-risk patients, but is recommended for high-risk patients. This recommendation follows the consensus statement: Guidelines for Management of Incidental Pulmonary Nodules Detected on CT Images: From the  Fleischner Society 2017; Radiology 2017; 284:228-243. 3. Cardiomegaly with marked pulmonary vascular congestion and redistribution with interlobular septal thickening and peribronchial cuffing, suggestive of mild interstitial edema. 4. Ascending thoracic aortic aneurysm measuring up to 4.3 cm. Recommend annual imaging followup by CTA or MRA. This recommendation follows 2010 ACCF/AHA/AATS/ACR/ASA/SCA/SCAI/SIR/STS/SVM Guidelines for the Diagnosis and Management of Patients with Thoracic Aortic Disease. Circulation. 2010; 121: V035-K093. Aortic aneurysm NOS (ICD10-I71.9) 5. Aortic Atherosclerosis (ICD10-I70.0). Electronically Signed   By: Lovena Le M.D.   On: 04/29/2020 16:58   MR Brain Wo Contrast  Result Date: 05/01/2020 CLINICAL DATA:  Frequent headaches. Intractable episodic tension-type headache. New feature or increased frequency. EXAM: MRI HEAD WITHOUT CONTRAST TECHNIQUE: Multiplanar, multiecho pulse sequences of the brain and surrounding structures were obtained without intravenous contrast. COMPARISON:  Head CT March 27, 2020. FINDINGS: Brain: No acute infarction, hemorrhage, hydrocephalus, extra-axial collection or mass lesion. Small amount of scattered foci of T2 hyperintensity are seen within the white matter of the cerebral hemispheres, nonspecific, most likely related to chronic small vessel ischemia. Tortuosity of the left vertebral artery with mass effect on the left anterolateral aspect of the medulla without parenchymal edema. Moderate parenchymal volume loss. Vascular: Normal flow voids. Skull  and upper cervical spine: Normal marrow signal. Sinuses/Orbits: Bilateral seen lens surgery. Paranasal sinuses are essentially clear. Other: Right mastoid effusion. IMPRESSION: 1. No acute intracranial abnormality. 2. Moderate parenchymal volume loss and mild chronic small vessel ischemia. 3. Right mastoid effusion. Electronically Signed   By: Pedro Earls M.D.   On: 05/01/2020 17:26   US RENAL  Result Date: 05/07/2020 CLINICAL DATA:  Bacteremia EXAM: RENAL / URINARY TRACT ULTRASOUND COMPLETE COMPARISON:  12/14/2019 FINDINGS: Right Kidney: Renal measurements: 11.8 x 6.4 x 6.2 cm. = volume: 243 mL. Severe chronic hydronephrosis is noted stable in appearance from the prior CT examination. Debris is noted within the urine likely representing an underlying urinary tract infection. 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 Scattered cysts are noted within the right kidney is well. Left Kidney: Renal measurements: 11.7 x 6.3 x 6.5 cm. = volume: 252 mL. Stable cysts are noted within the left kidney. The largest of these measures 5 cm in greatest dimension. Bladder: Appears normal for degree of bladder distention. Other: None. IMPRESSION: Severe hydronephrosis on the right of a chronic nature. Increased debris is noted within the urine which may be related to an underlying infection. Correlation with urinalysis is recommended. Bilateral renal cysts stable from the prior CT examination. Electronically Signed   By: Inez Catalina M.D.   On: 05/07/2020 19:11   DG CHEST PORT 1 VIEW  Result Date: 2020-05-10 CLINICAL DATA:  Chest discomfort and shortness of breath EXAM: PORTABLE CHEST 1 VIEW COMPARISON:  05/05/2020 FINDINGS: S shaped thoracolumbar spine curvature. Midline trachea. Moderate cardiomegaly. New small left and trace right pleural fluid. No pneumothorax. Lower lung predominant interstitial opacities are new. Left greater than right base airspace disease. IMPRESSION: Cardiomegaly with  development of congestive heart failure. Small, left larger than right pleural effusions with adjacent airspace disease, atelectasis versus concurrent infection. Electronically Signed   By: Abigail Miyamoto M.D.   On: May 10, 2020 12:30   DG Chest Port 1 View  Result Date: 04/12/2020 CLINICAL DATA:  Shortness of breath EXAM: PORTABLE CHEST 1 VIEW COMPARISON:  12/19/2019 FINDINGS: Mild right middle lobe scarring. No focal consolidation. No pleural effusion or pneumothorax. Stable cardiomegaly. No acute osseous abnormality. IMPRESSION: No active disease. Electronically Signed   By: Elbert Ewings  Patel   On: 05/02/2020 14:28    Microbiology Recent Results (from the past 240 hour(s))  Culture, blood (routine x 2)     Status: Abnormal (Preliminary result)   Collection Time: 04/25/2020  1:39 PM   Specimen: BLOOD  Result Value Ref Range Status   Specimen Description BLOOD RIGHT ANTECUBITAL  Final   Special Requests   Final    BOTTLES DRAWN AEROBIC AND ANAEROBIC Blood Culture adequate volume   Culture  Setup Time   Final    IN BOTH AEROBIC AND ANAEROBIC BOTTLES GRAM NEGATIVE RODS Organism ID to follow CRITICAL RESULT CALLED TO, READ BACK BY AND VERIFIED WITH: C AMEND PHARMD 05/07/20 0453 JDW Performed at [facility], [place] [address] [place], [place] [address]    Culture KLEBSIELLA PNEUMONIAE (A)  Final   Report Status PENDING  Incomplete  Blood Culture ID Panel (Reflexed)     Status: Abnormal   Collection Time: 04/15/2020  1:39 PM  Result Value Ref Range Status   Enterococcus faecalis NOT DETECTED NOT DETECTED Final   Enterococcus Faecium NOT DETECTED NOT DETECTED Final   Listeria monocytogenes NOT DETECTED NOT DETECTED Final   Staphylococcus species NOT DETECTED NOT DETECTED Final   Staphylococcus aureus (BCID) NOT DETECTED NOT DETECTED Final   Staphylococcus epidermidis NOT DETECTED NOT DETECTED Final   Staphylococcus lugdunensis NOT DETECTED NOT DETECTED Final   Streptococcus species NOT DETECTED  NOT DETECTED Final   Streptococcus agalactiae NOT DETECTED NOT DETECTED Final   Streptococcus pneumoniae NOT DETECTED NOT DETECTED Final   Streptococcus pyogenes NOT DETECTED NOT DETECTED Final   A.calcoaceticus-baumannii NOT DETECTED NOT DETECTED Final   Bacteroides fragilis NOT DETECTED NOT DETECTED Final   Enterobacterales DETECTED (A) NOT DETECTED Final    Comment: Enterobacterales represent a large order of gram negative bacteria, not a single organism. CRITICAL RESULT CALLED TO, READ BACK BY AND VERIFIED WITH: C AMEND PHARMD 05/07/20 0453 JDW    Enterobacter cloacae complex NOT DETECTED NOT DETECTED Final   Escherichia coli NOT DETECTED NOT DETECTED Final   Klebsiella aerogenes NOT DETECTED NOT DETECTED Final   Klebsiella oxytoca NOT DETECTED NOT DETECTED Final   Klebsiella pneumoniae DETECTED (A) NOT DETECTED Final    Comment: CRITICAL RESULT CALLED TO, READ BACK BY AND VERIFIED WITH: C AMEND PHARMD 05/07/20 0453 JDW    Proteus species NOT DETECTED NOT DETECTED Final   Salmonella species NOT DETECTED NOT DETECTED Final   Serratia marcescens NOT DETECTED NOT DETECTED Final   Haemophilus influenzae NOT DETECTED NOT DETECTED Final   Neisseria meningitidis NOT DETECTED NOT DETECTED Final   Pseudomonas aeruginosa NOT DETECTED NOT DETECTED Final   Stenotrophomonas maltophilia NOT DETECTED NOT DETECTED Final   Candida albicans NOT DETECTED NOT DETECTED Final   Candida auris NOT DETECTED NOT DETECTED Final   Candida glabrata NOT DETECTED NOT DETECTED Final   Candida krusei NOT DETECTED NOT DETECTED Final   Candida parapsilosis NOT DETECTED NOT DETECTED Final   Candida tropicalis NOT DETECTED NOT DETECTED Final   Cryptococcus neoformans/gattii NOT DETECTED NOT DETECTED Final   CTX-M ESBL NOT DETECTED NOT DETECTED Final   Carbapenem resistance IMP NOT DETECTED NOT DETECTED Final   Carbapenem resistance KPC NOT DETECTED NOT DETECTED Final   Carbapenem resistance NDM NOT DETECTED NOT  DETECTED Final   Carbapenem resist OXA 48 LIKE NOT DETECTED NOT DETECTED Final   Carbapenem resistance VIM NOT DETECTED NOT DETECTED Final    Comment: Performed at [facility], [address] [place], [place] [address]  Resp Panel by RT-PCR (Flu A&B, Covid) Nasopharyngeal Swab     Status: None   Collection Time: 04/10/2020  1:40 PM   Specimen: Nasopharyngeal Swab; Nasopharyngeal(NP) swabs in vial transport medium  Result Value Ref Range Status   SARS Coronavirus 2 by RT PCR NEGATIVE NEGATIVE Final    Comment: (NOTE) SARS-CoV-2 target nucleic acids are NOT DETECTED.  The SARS-CoV-2 RNA is generally detectable in upper respiratory specimens during the acute phase of infection. The lowest concentration of SARS-CoV-2 viral copies this assay can detect is 138 copies/mL. A negative result does not preclude SARS-Cov-2 infection and should not be used as the sole basis for treatment or other patient management decisions. A negative result may occur with  improper specimen collection/handling, submission of specimen other than nasopharyngeal swab, presence of viral mutation(s) within the areas targeted by this assay, and inadequate number of viral copies(<138 copies/mL). A negative result must be combined with clinical observations, patient history, and epidemiological information. The expected result is Negative.  Fact Sheet for Patients:  EntrepreneurPulse.com.au  Fact Sheet for Healthcare Providers:  IncredibleEmployment.be  This test is no t yet approved or cleared by the [place] FDA and  has been authorized for detection and/or diagnosis of SARS-CoV-2 by FDA under an Emergency Use Authorization (EUA). This EUA will remain  in effect (meaning this test can be used) for the duration of the COVID-19 declaration under Section 564(b)(1) of the Act, 21 U.S.C.section 360bbb-3(b)(1), unless the authorization is terminated  or revoked sooner.        Influenza A by PCR NEGATIVE NEGATIVE Final   Influenza B by PCR NEGATIVE NEGATIVE Final    Comment: (NOTE) The Xpert Xpress SARS-CoV-2/FLU/RSV plus assay is intended as an aid in the diagnosis of influenza from Nasopharyngeal swab specimens and should not be used as a sole basis for treatment. Nasal washings and aspirates are unacceptable for Xpert Xpress SARS-CoV-2/FLU/RSV testing.  Fact Sheet for Patients: EntrepreneurPulse.com.au  Fact Sheet for Healthcare Providers: IncredibleEmployment.be  This test is not yet approved or cleared by the [place] FDA and has been authorized for detection and/or diagnosis of SARS-CoV-2 by FDA under an Emergency Use Authorization (EUA). This EUA will remain in effect (meaning this test can be used) for the duration of the COVID-19 declaration under Section 564(b)(1) of the Act, 21 U.S.C. section 360bbb-3(b)(1), unless the authorization is terminated or revoked.  Performed at [facility], [place] [address], [place], [place] [address]   Culture, blood (routine x 2)     Status: None (Preliminary result)   Collection Time: 04/22/2020  1:44 PM   Specimen: BLOOD  Result Value Ref Range Status   Specimen Description BLOOD RIGHT ANTECUBITAL  Final   Special Requests   Final    BOTTLES DRAWN AEROBIC AND ANAEROBIC Blood Culture adequate volume   Culture  Setup Time   Final    IN BOTH AEROBIC AND ANAEROBIC BOTTLES GRAM NEGATIVE RODS CRITICAL VALUE NOTED.  VALUE IS CONSISTENT WITH PREVIOUSLY REPORTED AND CALLED VALUE. Performed at [facility], [place] [address] [place], [place] [address]    Culture GRAM NEGATIVE RODS  Final   Report Status PENDING  Incomplete    Lab Basic Metabolic Panel: Recent Labs  Lab 04/14/2020 1358 04/17/2020 1437 05/07/20 0210 05-10-20 0109  NA 138 138 138 134*  K 2.9* 3.2* 4.3 4.9  CL  --  97* 101 98  CO2  --  25 24  20*  GLUCOSE  --  110* 113* 97  BUN  --  90*  88* 105*  CREATININE  --  1.95* 1.75* 1.98*  CALCIUM  --  10.4* 9.8 9.7  MG  --  2.3  --  2.6*  PHOS  --   --  3.8  --    Liver Function Tests: Recent Labs  Lab 04/16/2020 1437  AST 21  ALT 18  ALKPHOS 83  BILITOT 1.4*  PROT 6.7  ALBUMIN 3.6   Recent Labs  Lab 04/11/2020 1437  LIPASE 34   Recent Labs  Lab 04/19/2020 1433  AMMONIA 44*   CBC: Recent Labs  Lab 04/11/2020 1358 04/25/2020 1437 05/07/20 0210 24-May-2020 0109  WBC  --  19.4* 19.0* 21.3*  NEUTROABS  --  15.9*  --  17.6*  HGB 12.9 12.5 11.7* 11.9*  HCT 38.0 37.2 34.7* 36.0  MCV  --  115.2* 114.1* 115.8*  PLT  --  367 299 279   Cardiac Enzymes: Recent Labs  Lab 04/27/2020 1437  CKTOTAL 103   Sepsis Labs: Recent Labs  Lab 05/03/2020 1432 04/19/2020 1437 04/24/2020 1539 05/07/20 0210 2020-05-24 0109 05/24/20 0927  WBC  --  19.4*  --  19.0* 21.3*  --   LATICACIDVEN 2.0*  --  2.0*  --   --  10.4*    Procedures/Operations   Total time spent : more than 45 minutes for direct patient care.  Dante Gang Jatara Huettner 2020/05/24, 1:29 PM

## 2020-05-09 NOTE — Progress Notes (Addendum)
   May 13, 2020 1015  Clinical Encounter Type  Visit Type Code  Referral From  ([facility] Medical Page)  Consult/Referral To Chaplain  Chaplain responded to Code [facility] for Mrs. Karapetyan.  Medical Team worked to revive Mrs. Crumpacker and she was stable upon my departure. Code Blue was then canceled.  Her daughter was called and Chaplain was not needed at this time.    Chaplain Manda Holstad Morgan-Simpson [phone]

## 2020-05-09 NOTE — Progress Notes (Addendum)
[facility] night shift.  The nursing staff reports that the patient's most recent BP is 84/70 mmHg with a MAP of 74 mmHg.  Her heart rate has been ranging between 110 to 130 bpm with an irregular rhythm.  Additionally, her lab results showed an increase in BUN/creatinine this morning from 88./1.75 yesterday to 105/1.98 mg/dL today.  The staff also reports the patient's urinary output has been poor during night shift.  Her weight is roughly 47 kg.  A 500 mL LR bolus was ordered.  I will also resume IVF in the form of LR 75 mL/h.  Tennis Must, MD

## 2020-05-09 DEATH — deceased

## 2020-05-10 MED FILL — Medication: Qty: 1 | Status: AC

## 2020-05-13 LAB — CULTURE, BLOOD (ROUTINE X 2)
Culture: NO GROWTH
Culture: NO GROWTH

## 2020-06-12 ENCOUNTER — Encounter (INDEPENDENT_AMBULATORY_CARE_PROVIDER_SITE_OTHER): Payer: Medicare Other | Admitting: Ophthalmology

## 2020-06-12 ENCOUNTER — Encounter (INDEPENDENT_AMBULATORY_CARE_PROVIDER_SITE_OTHER): Payer: Self-pay

## 2020-06-17 ENCOUNTER — Encounter (INDEPENDENT_AMBULATORY_CARE_PROVIDER_SITE_OTHER): Payer: Self-pay

## 2020-06-17 ENCOUNTER — Encounter (INDEPENDENT_AMBULATORY_CARE_PROVIDER_SITE_OTHER): Payer: Medicare Other | Admitting: Ophthalmology

## 2020-07-01 ENCOUNTER — Encounter (HOSPITAL_COMMUNITY): Payer: Medicare Other | Admitting: Cardiology

## 2020-07-02 ENCOUNTER — Encounter: Payer: Self-pay | Admitting: Internal Medicine

## 2020-07-03 ENCOUNTER — Other Ambulatory Visit: Payer: Medicare Other

## 2020-07-15 ENCOUNTER — Ambulatory Visit: Payer: Medicare Other | Admitting: Hematology and Oncology

## 2020-07-15 ENCOUNTER — Other Ambulatory Visit: Payer: Medicare Other

## 2020-09-30 ENCOUNTER — Ambulatory Visit: Payer: Medicare Other
